# Patient Record
Sex: Female | Born: 1965
Health system: Southern US, Community
[De-identification: ages and names within clinical notes are randomized; demographics above are authoritative.]

## PROBLEM LIST (undated history)

## (undated) DIAGNOSIS — K219 Gastro-esophageal reflux disease without esophagitis: Secondary | ICD-10-CM

## (undated) DIAGNOSIS — I10 Essential (primary) hypertension: Secondary | ICD-10-CM

## (undated) DIAGNOSIS — R569 Unspecified convulsions: Secondary | ICD-10-CM

## (undated) HISTORY — DX: Gastro-esophageal reflux disease without esophagitis: K21.9

---

## 1997-11-30 ENCOUNTER — Emergency Department (HOSPITAL_COMMUNITY): Admission: EM | Admit: 1997-11-30 | Discharge: 1997-11-30 | Payer: Self-pay | Admitting: Internal Medicine

## 1998-01-15 ENCOUNTER — Emergency Department (HOSPITAL_COMMUNITY): Admission: EM | Admit: 1998-01-15 | Discharge: 1998-01-15 | Payer: Self-pay | Admitting: Emergency Medicine

## 1998-01-15 ENCOUNTER — Encounter: Payer: Self-pay | Admitting: Emergency Medicine

## 1998-01-24 ENCOUNTER — Emergency Department (HOSPITAL_COMMUNITY): Admission: EM | Admit: 1998-01-24 | Discharge: 1998-01-25 | Payer: Self-pay | Admitting: Emergency Medicine

## 1998-01-31 ENCOUNTER — Encounter: Admission: RE | Admit: 1998-01-31 | Discharge: 1998-01-31 | Payer: Self-pay | Admitting: Family Medicine

## 1998-02-17 ENCOUNTER — Encounter: Admission: RE | Admit: 1998-02-17 | Discharge: 1998-02-17 | Payer: Self-pay | Admitting: Family Medicine

## 2000-11-17 ENCOUNTER — Encounter: Admission: RE | Admit: 2000-11-17 | Discharge: 2000-11-17 | Payer: Self-pay | Admitting: Family Medicine

## 2000-11-29 ENCOUNTER — Encounter: Admission: RE | Admit: 2000-11-29 | Discharge: 2000-11-29 | Payer: Self-pay | Admitting: Sports Medicine

## 2000-11-29 ENCOUNTER — Encounter: Payer: Self-pay | Admitting: Sports Medicine

## 2000-12-23 ENCOUNTER — Encounter (INDEPENDENT_AMBULATORY_CARE_PROVIDER_SITE_OTHER): Payer: Self-pay | Admitting: *Deleted

## 2000-12-23 LAB — CONVERTED CEMR LAB

## 2000-12-26 ENCOUNTER — Encounter: Admission: RE | Admit: 2000-12-26 | Discharge: 2000-12-26 | Payer: Self-pay | Admitting: Sports Medicine

## 2001-01-02 ENCOUNTER — Encounter: Admission: RE | Admit: 2001-01-02 | Discharge: 2001-01-02 | Payer: Self-pay | Admitting: Family Medicine

## 2001-04-14 ENCOUNTER — Encounter: Admission: RE | Admit: 2001-04-14 | Discharge: 2001-04-14 | Payer: Self-pay | Admitting: Family Medicine

## 2001-07-05 ENCOUNTER — Emergency Department (HOSPITAL_COMMUNITY): Admission: EM | Admit: 2001-07-05 | Discharge: 2001-07-05 | Payer: Self-pay

## 2001-07-19 ENCOUNTER — Encounter: Admission: RE | Admit: 2001-07-19 | Discharge: 2001-07-19 | Payer: Self-pay | Admitting: Family Medicine

## 2001-07-27 ENCOUNTER — Encounter: Admission: RE | Admit: 2001-07-27 | Discharge: 2001-07-27 | Payer: Self-pay | Admitting: Family Medicine

## 2003-04-29 ENCOUNTER — Emergency Department (HOSPITAL_COMMUNITY): Admission: EM | Admit: 2003-04-29 | Discharge: 2003-04-30 | Payer: Self-pay | Admitting: Emergency Medicine

## 2005-06-08 ENCOUNTER — Emergency Department (HOSPITAL_COMMUNITY): Admission: EM | Admit: 2005-06-08 | Discharge: 2005-06-09 | Payer: Self-pay | Admitting: Emergency Medicine

## 2006-04-21 DIAGNOSIS — K219 Gastro-esophageal reflux disease without esophagitis: Secondary | ICD-10-CM

## 2006-04-21 HISTORY — DX: Gastro-esophageal reflux disease without esophagitis: K21.9

## 2006-04-22 ENCOUNTER — Encounter (INDEPENDENT_AMBULATORY_CARE_PROVIDER_SITE_OTHER): Payer: Self-pay | Admitting: *Deleted

## 2006-07-16 ENCOUNTER — Emergency Department (HOSPITAL_COMMUNITY): Admission: EM | Admit: 2006-07-16 | Discharge: 2006-07-17 | Payer: Self-pay | Admitting: Emergency Medicine

## 2006-08-04 ENCOUNTER — Telehealth: Payer: Self-pay | Admitting: *Deleted

## 2006-08-05 ENCOUNTER — Ambulatory Visit: Payer: Self-pay | Admitting: Family Medicine

## 2006-08-05 ENCOUNTER — Encounter: Payer: Self-pay | Admitting: *Deleted

## 2006-08-05 ENCOUNTER — Encounter: Payer: Self-pay | Admitting: Family Medicine

## 2006-08-05 DIAGNOSIS — N390 Urinary tract infection, site not specified: Secondary | ICD-10-CM

## 2006-08-05 DIAGNOSIS — R109 Unspecified abdominal pain: Secondary | ICD-10-CM

## 2006-08-05 LAB — CONVERTED CEMR LAB
Beta hcg, urine, semiquantitative: NEGATIVE
Bilirubin Urine: NEGATIVE
Glucose, Urine, Semiquant: NEGATIVE
Ketones, urine, test strip: NEGATIVE
Nitrite: NEGATIVE
Protein, U semiquant: 100
Specific Gravity, Urine: 1.015
Urobilinogen, UA: 1
pH: 6.5

## 2006-08-12 ENCOUNTER — Encounter: Payer: Self-pay | Admitting: Family Medicine

## 2006-08-12 ENCOUNTER — Ambulatory Visit: Payer: Self-pay | Admitting: Family Medicine

## 2006-08-12 DIAGNOSIS — I10 Essential (primary) hypertension: Secondary | ICD-10-CM

## 2006-08-12 DIAGNOSIS — E669 Obesity, unspecified: Secondary | ICD-10-CM

## 2006-08-12 LAB — CONVERTED CEMR LAB
ALT: 10 units/L (ref 0–35)
AST: 9 units/L (ref 0–37)
Albumin: 3.8 g/dL (ref 3.5–5.2)
Alkaline Phosphatase: 100 units/L (ref 39–117)
BUN: 5 mg/dL — ABNORMAL LOW (ref 6–23)
CO2: 21 meq/L (ref 19–32)
Calcium: 9.1 mg/dL (ref 8.4–10.5)
Chloride: 106 meq/L (ref 96–112)
Creatinine, Ser: 0.8 mg/dL (ref 0.40–1.20)
Direct LDL: 133 mg/dL — ABNORMAL HIGH
Glucose, Bld: 91 mg/dL (ref 70–99)
Potassium: 4.2 meq/L (ref 3.5–5.3)
Sodium: 138 meq/L (ref 135–145)
TSH: 0.826 microintl units/mL (ref 0.350–5.50)
Total Bilirubin: 0.2 mg/dL — ABNORMAL LOW (ref 0.3–1.2)
Total Protein: 7.2 g/dL (ref 6.0–8.3)

## 2006-09-09 ENCOUNTER — Ambulatory Visit: Payer: Self-pay | Admitting: Family Medicine

## 2006-10-31 ENCOUNTER — Telehealth (INDEPENDENT_AMBULATORY_CARE_PROVIDER_SITE_OTHER): Payer: Self-pay | Admitting: Family Medicine

## 2007-12-06 ENCOUNTER — Ambulatory Visit: Payer: Self-pay | Admitting: Family Medicine

## 2007-12-06 DIAGNOSIS — L708 Other acne: Secondary | ICD-10-CM

## 2008-01-09 ENCOUNTER — Encounter: Payer: Self-pay | Admitting: *Deleted

## 2008-01-30 ENCOUNTER — Ambulatory Visit: Payer: Self-pay | Admitting: Family Medicine

## 2008-01-30 ENCOUNTER — Encounter (INDEPENDENT_AMBULATORY_CARE_PROVIDER_SITE_OTHER): Payer: Self-pay | Admitting: Family Medicine

## 2008-01-30 LAB — CONVERTED CEMR LAB
ALT: 13 units/L (ref 0–35)
Albumin: 3.9 g/dL (ref 3.5–5.2)
CO2: 22 meq/L (ref 19–32)
Calcium: 8.9 mg/dL (ref 8.4–10.5)
Chloride: 104 meq/L (ref 96–112)
Creatinine, Ser: 0.76 mg/dL (ref 0.40–1.20)
Direct LDL: 153 mg/dL — ABNORMAL HIGH
Total Protein: 7 g/dL (ref 6.0–8.3)

## 2008-01-31 ENCOUNTER — Encounter (INDEPENDENT_AMBULATORY_CARE_PROVIDER_SITE_OTHER): Payer: Self-pay | Admitting: Family Medicine

## 2008-02-27 ENCOUNTER — Ambulatory Visit: Payer: Self-pay | Admitting: Family Medicine

## 2008-09-13 ENCOUNTER — Telehealth: Payer: Self-pay | Admitting: *Deleted

## 2008-11-09 ENCOUNTER — Encounter (INDEPENDENT_AMBULATORY_CARE_PROVIDER_SITE_OTHER): Payer: Self-pay | Admitting: *Deleted

## 2008-11-09 DIAGNOSIS — Z87891 Personal history of nicotine dependence: Secondary | ICD-10-CM | POA: Insufficient documentation

## 2012-08-28 ENCOUNTER — Encounter (HOSPITAL_COMMUNITY): Payer: Self-pay | Admitting: Emergency Medicine

## 2012-08-28 ENCOUNTER — Emergency Department (HOSPITAL_COMMUNITY)
Admission: EM | Admit: 2012-08-28 | Discharge: 2012-08-28 | Disposition: A | Discharge status: Home / Self Care | Payer: Self-pay | Attending: Emergency Medicine | Admitting: Emergency Medicine

## 2012-08-28 ENCOUNTER — Emergency Department (HOSPITAL_COMMUNITY): Payer: Self-pay

## 2012-08-28 DIAGNOSIS — Z79899 Other long term (current) drug therapy: Secondary | ICD-10-CM | POA: Insufficient documentation

## 2012-08-28 DIAGNOSIS — J189 Pneumonia, unspecified organism: Secondary | ICD-10-CM

## 2012-08-28 DIAGNOSIS — J159 Unspecified bacterial pneumonia: Secondary | ICD-10-CM | POA: Insufficient documentation

## 2012-08-28 DIAGNOSIS — R52 Pain, unspecified: Secondary | ICD-10-CM | POA: Insufficient documentation

## 2012-08-28 DIAGNOSIS — F17200 Nicotine dependence, unspecified, uncomplicated: Secondary | ICD-10-CM | POA: Insufficient documentation

## 2012-08-28 MED ORDER — HYDROCOD POLST-CHLORPHEN POLST 10-8 MG/5ML PO LQCR
5.0000 mL | Freq: Two times a day (BID) | ORAL | Status: DC
  Administered 2012-08-28: 5 mL via ORAL
  Filled 2012-08-28: qty 5

## 2012-08-28 MED ORDER — HYDROCOD POLST-CHLORPHEN POLST 10-8 MG/5ML PO LQCR: 5.0000 mL | Freq: Two times a day (BID) | ORAL | Status: DC

## 2012-08-28 MED ORDER — AZITHROMYCIN 250 MG PO TABS: 250.0000 mg | ORAL_TABLET | Freq: Every day | ORAL | Status: DC

## 2012-08-28 MED ORDER — CEFTRIAXONE SODIUM 1 G IJ SOLR
1.0000 g | Freq: Once | INTRAMUSCULAR | Status: AC
  Administered 2012-08-28: 1 g via INTRAMUSCULAR
  Filled 2012-08-28: qty 10

## 2012-08-28 MED ORDER — ALBUTEROL SULFATE HFA 108 (90 BASE) MCG/ACT IN AERS
2.0000 | INHALATION_SPRAY | RESPIRATORY_TRACT | Status: DC | PRN
  Administered 2012-08-28: 2 via RESPIRATORY_TRACT
  Filled 2012-08-28: qty 6.7

## 2012-08-28 MED ORDER — AZITHROMYCIN 250 MG PO TABS
500.0000 mg | ORAL_TABLET | Freq: Once | ORAL | Status: AC
  Administered 2012-08-28: 500 mg via ORAL
  Filled 2012-08-28: qty 2

## 2012-08-28 NOTE — ED Provider Notes (Signed)
History    CSN: [id number] Arrival date & time 08/28/12  0100  First MD Initiated Contact with Patient 08/28/12 0122     Chief Complaint  Patient presents with  . Cough  . Generalized Body Aches   (Consider location/radiation/quality/duration/timing/severity/associated sxs/prior Treatment) HPI Comments: Patient with 3 days of cough that is intermittent and persistent  Has been taking OTC Nyquil without relief   Patient is a 47 y.o. female presenting with cough. The history is provided by the patient.  Cough Cough characteristics:  Productive Sputum characteristics:  Nondescript Severity:  Moderate Onset quality:  Gradual Timing:  Intermittent Chronicity:  New Smoker: yes   Relieved by:  Nothing Worsened by:  Activity Ineffective treatments:  Decongestant Associated symptoms: no chest pain, no chills, no fever, no headaches, no shortness of breath, no sore throat and no wheezing    History reviewed. No pertinent past medical history. History reviewed. No pertinent past surgical history. History reviewed. No pertinent family history. History  Substance Use Topics  . Smoking status: Current Every Day Smoker  . Smokeless tobacco: Not on file  . Alcohol Use: Not on file   OB History   Grav Para Term Preterm Abortions TAB SAB Ect Mult Living                 Review of Systems  Constitutional: Negative for fever and chills.  HENT: Negative for sore throat.   Respiratory: Positive for cough. Negative for shortness of breath and wheezing.   Cardiovascular: Negative for chest pain.  Neurological: Negative for dizziness and headaches.  All other systems reviewed and are negative.    Allergies  Ace inhibitors  Home Medications   Current Outpatient Rx  Name  Route  Sig  Dispense  Refill  . lansoprazole (PREVACID) 30 MG capsule   Oral   Take 30 mg by mouth as needed (only use when eating spicy food).         . naproxen sodium (ANAPROX) 220 MG tablet   Oral   Take  220 mg by mouth 2 (two) times daily with a meal.         . Pseudoeph-Doxylamine-DM-APAP (NYQUIL PO)   Oral   Take 30 mLs by mouth every 6 (six) hours as needed (cough).         Marland Kitchen azithromycin (ZITHROMAX) 250 MG tablet   Oral   Take 1 tablet (250 mg total) by mouth daily.   4 tablet   0   . chlorpheniramine-HYDROcodone (TUSSIONEX) 10-8 MG/5ML LQCR   Oral   Take 5 mLs by mouth every 12 (twelve) hours.   140 mL   0    BP 165/113  Pulse 89  Temp(Src) 99 F (37.2 C) (Oral)  Resp 20  Ht 5\' 2"  (1.575 m)  Wt 152 lb 4 oz (69.06 kg)  BMI 27.84 kg/m2  SpO2 96%  LMP 08/12/2012 Physical Exam  Vitals reviewed. Constitutional: She is oriented to person, place, and time. She appears well-developed and well-nourished.  HENT:  Head: Normocephalic.  Mouth/Throat: Oropharynx is clear and moist.  Eyes: Pupils are equal, round, and reactive to light.  Neck: Normal range of motion.  Cardiovascular: Normal rate and regular rhythm.   Pulmonary/Chest: Effort normal and breath sounds normal. She has no wheezes.  coughing with speech   Abdominal: Soft.  Musculoskeletal: Normal range of motion.  Lymphadenopathy:    She has no cervical adenopathy.  Neurological: She is alert and oriented to person, place, and time.  Skin: Skin is warm.    ED Course  Procedures (including critical care time) Labs Reviewed - No data to display Dg Chest 2 View  08/28/2012   *RADIOLOGY REPORT*  Clinical Data: Cough and chest pain for 72 hours.  CHEST - 2 VIEW  Comparison: None.  Findings: There is diffuse bilateral airspace infiltration throughout both lungs consistent with pneumonia, hemorrhage, or other infiltrative process.  Edema is less likely.  Normal heart size and pulmonary vascularity.  No blunting of costophrenic angles.  No pneumothorax.  Follow up after resolution of acute symptoms is recommended to exclude underlying neoplasm.  IMPRESSION: Diffuse airspace disease throughout both lungs.   Original  Report Authenticated By: Burman Nieves, M.D.   1. CAP (community acquired pneumonia)     MDM  X-ray shows that the patient has patchy, infiltrates.  She's been treated with Rocephin IM and by mouth azithromycin in the emergency department to be discharged him with a prescription for additional azithromycin test in cough medication, and an inhaler Patient states, after she used the inhaler, and had one dose of Testim cough medication.  She feels much, better  Arman Filter, NP 08/28/12 0329  Arman Filter, NP 08/28/12 1954

## 2012-08-28 NOTE — ED Notes (Signed)
Pt states that she has had a productive cough with white sputum x 3 days. Noticed some blood in sputum. States body aches from coughing. Smoker.

## 2012-08-28 NOTE — ED Provider Notes (Signed)
Medical screening examination/treatment/procedure(s) were performed by non-physician practitioner and as supervising physician I was immediately available for consultation/collaboration.   Richardean Canal, MD 08/28/12 [phone]

## 2012-09-02 ENCOUNTER — Encounter (HOSPITAL_COMMUNITY): Payer: Self-pay | Admitting: Family Medicine

## 2012-09-02 ENCOUNTER — Emergency Department (HOSPITAL_COMMUNITY): Payer: Self-pay

## 2012-09-02 ENCOUNTER — Inpatient Hospital Stay (HOSPITAL_COMMUNITY)
Admission: EM | Admit: 2012-09-02 | Discharge: 2012-09-05 | DRG: 194 | Disposition: A | Discharge status: Home / Self Care | Payer: MEDICAID | Attending: Family Medicine | Admitting: Family Medicine

## 2012-09-02 DIAGNOSIS — F17200 Nicotine dependence, unspecified, uncomplicated: Secondary | ICD-10-CM

## 2012-09-02 DIAGNOSIS — J189 Pneumonia, unspecified organism: Principal | ICD-10-CM | POA: Diagnosis present

## 2012-09-02 DIAGNOSIS — R0602 Shortness of breath: Secondary | ICD-10-CM | POA: Diagnosis present

## 2012-09-02 DIAGNOSIS — R000 Tachycardia, unspecified: Secondary | ICD-10-CM | POA: Diagnosis present

## 2012-09-02 DIAGNOSIS — R109 Unspecified abdominal pain: Secondary | ICD-10-CM

## 2012-09-02 DIAGNOSIS — E871 Hypo-osmolality and hyponatremia: Secondary | ICD-10-CM

## 2012-09-02 DIAGNOSIS — R0682 Tachypnea, not elsewhere classified: Secondary | ICD-10-CM | POA: Diagnosis present

## 2012-09-02 DIAGNOSIS — L708 Other acne: Secondary | ICD-10-CM

## 2012-09-02 DIAGNOSIS — Z87891 Personal history of nicotine dependence: Secondary | ICD-10-CM | POA: Diagnosis present

## 2012-09-02 DIAGNOSIS — E669 Obesity, unspecified: Secondary | ICD-10-CM

## 2012-09-02 DIAGNOSIS — K219 Gastro-esophageal reflux disease without esophagitis: Secondary | ICD-10-CM | POA: Diagnosis present

## 2012-09-02 DIAGNOSIS — I10 Essential (primary) hypertension: Secondary | ICD-10-CM | POA: Diagnosis present

## 2012-09-02 DIAGNOSIS — N390 Urinary tract infection, site not specified: Secondary | ICD-10-CM

## 2012-09-02 HISTORY — DX: Gastro-esophageal reflux disease without esophagitis: K21.9

## 2012-09-02 LAB — POCT I-STAT, CHEM 8
BUN: 7 mg/dL (ref 6–23)
Potassium: 4.3 mEq/L (ref 3.5–5.1)
Sodium: 137 mEq/L (ref 135–145)
TCO2: 25 mmol/L (ref 0–100)

## 2012-09-02 LAB — CBC
HCT: 36 % (ref 36.0–46.0)
Hemoglobin: 12 g/dL (ref 12.0–15.0)
MCHC: 33.3 g/dL (ref 30.0–36.0)
RBC: 4.22 MIL/uL (ref 3.87–5.11)
WBC: 12.1 10*3/uL — ABNORMAL HIGH (ref 4.0–10.5)

## 2012-09-02 LAB — PRO B NATRIURETIC PEPTIDE: Pro B Natriuretic peptide (BNP): 2696 pg/mL — ABNORMAL HIGH (ref 0–125)

## 2012-09-02 MED ORDER — ALBUTEROL SULFATE (5 MG/ML) 0.5% IN NEBU
5.0000 mg | INHALATION_SOLUTION | Freq: Once | RESPIRATORY_TRACT | Status: AC
  Administered 2012-09-02: 5 mg via RESPIRATORY_TRACT
  Filled 2012-09-02: qty 1

## 2012-09-02 MED ORDER — OXYCODONE HCL 5 MG PO TABS
5.0000 mg | ORAL_TABLET | ORAL | Status: DC | PRN
  Administered 2012-09-04: 5 mg via ORAL
  Filled 2012-09-02: qty 1

## 2012-09-02 MED ORDER — LEVOFLOXACIN IN D5W 750 MG/150ML IV SOLN
750.0000 mg | INTRAVENOUS | Status: DC
  Administered 2012-09-03 – 2012-09-05 (×3): 750 mg via INTRAVENOUS
  Filled 2012-09-02 (×4): qty 150

## 2012-09-02 MED ORDER — VANCOMYCIN HCL IN DEXTROSE 750-5 MG/150ML-% IV SOLN
750.0000 mg | Freq: Two times a day (BID) | INTRAVENOUS | Status: DC
  Administered 2012-09-02 – 2012-09-04 (×5): 750 mg via INTRAVENOUS
  Filled 2012-09-02 (×6): qty 150

## 2012-09-02 MED ORDER — ACETAMINOPHEN 650 MG RE SUPP: 650.0000 mg | Freq: Four times a day (QID) | RECTAL | Status: DC | PRN

## 2012-09-02 MED ORDER — ACETAMINOPHEN 325 MG PO TABS: 650.0000 mg | ORAL_TABLET | Freq: Four times a day (QID) | ORAL | Status: DC | PRN

## 2012-09-02 MED ORDER — ONDANSETRON HCL 4 MG/2ML IJ SOLN: 4.0000 mg | Freq: Four times a day (QID) | INTRAMUSCULAR | Status: DC | PRN

## 2012-09-02 MED ORDER — ALBUTEROL SULFATE (5 MG/ML) 0.5% IN NEBU
2.5000 mg | INHALATION_SOLUTION | Freq: Four times a day (QID) | RESPIRATORY_TRACT | Status: DC
  Administered 2012-09-02 – 2012-09-03 (×4): 2.5 mg via RESPIRATORY_TRACT
  Filled 2012-09-02 (×4): qty 0.5

## 2012-09-02 MED ORDER — ONDANSETRON HCL 4 MG PO TABS: 4.0000 mg | ORAL_TABLET | Freq: Four times a day (QID) | ORAL | Status: DC | PRN

## 2012-09-02 MED ORDER — DM-GUAIFENESIN ER 30-600 MG PO TB12
1.0000 | ORAL_TABLET | Freq: Two times a day (BID) | ORAL | Status: DC
  Administered 2012-09-02 – 2012-09-05 (×7): 1 via ORAL
  Filled 2012-09-02 (×8): qty 1

## 2012-09-02 MED ORDER — HYDROMORPHONE HCL PF 1 MG/ML IJ SOLN
0.5000 mg | INTRAMUSCULAR | Status: DC | PRN
  Administered 2012-09-03 – 2012-09-04 (×3): 1 mg via INTRAVENOUS
  Filled 2012-09-02 (×3): qty 1

## 2012-09-02 MED ORDER — LORAZEPAM 0.5 MG PO TABS
0.2500 mg | ORAL_TABLET | Freq: Two times a day (BID) | ORAL | Status: DC | PRN
  Administered 2012-09-02 – 2012-09-03 (×2): 0.5 mg via ORAL
  Administered 2012-09-03: 0.25 mg via ORAL
  Filled 2012-09-02: qty 1
  Filled 2012-09-02: qty 2
  Filled 2012-09-02: qty 1

## 2012-09-02 MED ORDER — PHENOL 1.4 % MT LIQD
1.0000 | OROMUCOSAL | Status: DC | PRN
  Filled 2012-09-02: qty 177

## 2012-09-02 MED ORDER — ALBUTEROL SULFATE (5 MG/ML) 0.5% IN NEBU
2.5000 mg | INHALATION_SOLUTION | RESPIRATORY_TRACT | Status: DC | PRN
  Administered 2012-09-02 (×3): 2.5 mg via RESPIRATORY_TRACT
  Filled 2012-09-02 (×3): qty 0.5

## 2012-09-02 MED ORDER — BENZONATATE 100 MG PO CAPS
100.0000 mg | ORAL_CAPSULE | Freq: Three times a day (TID) | ORAL | Status: DC | PRN
  Administered 2012-09-02 – 2012-09-05 (×8): 100 mg via ORAL
  Filled 2012-09-02 (×5): qty 1

## 2012-09-02 MED ORDER — PANTOPRAZOLE SODIUM 40 MG PO TBEC
40.0000 mg | DELAYED_RELEASE_TABLET | Freq: Every day | ORAL | Status: DC
  Administered 2012-09-02 – 2012-09-05 (×4): 40 mg via ORAL
  Filled 2012-09-02 (×4): qty 1

## 2012-09-02 MED ORDER — ALUM & MAG HYDROXIDE-SIMETH 200-200-20 MG/5ML PO SUSP: 30.0000 mL | Freq: Four times a day (QID) | ORAL | Status: DC | PRN

## 2012-09-02 MED ORDER — DEXTROSE 5 % IV SOLN
2.0000 g | Freq: Two times a day (BID) | INTRAVENOUS | Status: DC
  Administered 2012-09-02 – 2012-09-04 (×6): 2 g via INTRAVENOUS
  Filled 2012-09-02 (×8): qty 2

## 2012-09-02 MED ORDER — HYDROCOD POLST-CHLORPHEN POLST 10-8 MG/5ML PO LQCR: 5.0000 mL | Freq: Two times a day (BID) | ORAL | Status: DC

## 2012-09-02 MED ORDER — LEVOFLOXACIN IN D5W 500 MG/100ML IV SOLN
500.0000 mg | Freq: Once | INTRAVENOUS | Status: AC
  Administered 2012-09-02: 500 mg via INTRAVENOUS
  Filled 2012-09-02: qty 100

## 2012-09-02 MED ORDER — VANCOMYCIN HCL IN DEXTROSE 1-5 GM/200ML-% IV SOLN
1000.0000 mg | Freq: Once | INTRAVENOUS | Status: AC
  Administered 2012-09-02: 1000 mg via INTRAVENOUS
  Filled 2012-09-02 (×2): qty 200

## 2012-09-02 MED ORDER — ZOLPIDEM TARTRATE 5 MG PO TABS
5.0000 mg | ORAL_TABLET | Freq: Every evening | ORAL | Status: DC | PRN
  Administered 2012-09-03 – 2012-09-04 (×3): 5 mg via ORAL
  Filled 2012-09-02 (×3): qty 1

## 2012-09-02 MED ORDER — ENOXAPARIN SODIUM 40 MG/0.4ML ~~LOC~~ SOLN
40.0000 mg | SUBCUTANEOUS | Status: DC
  Administered 2012-09-02 – 2012-09-05 (×4): 40 mg via SUBCUTANEOUS
  Filled 2012-09-02 (×4): qty 0.4

## 2012-09-02 MED ORDER — DEXTROSE 5 % IV SOLN
1.0000 g | Freq: Three times a day (TID) | INTRAVENOUS | Status: DC
  Filled 2012-09-02 (×2): qty 1

## 2012-09-02 MED ORDER — SODIUM CHLORIDE 0.9 % IV SOLN
INTRAVENOUS | Status: DC
  Administered 2012-09-02 (×2): via INTRAVENOUS

## 2012-09-02 NOTE — H&P (Signed)
[facility] History and Physical  Christina Byrd:[id number] DOB: 10-08-1965 DOA: 09/02/2012  Referring physician:  EDP PCP: No primary provider on file.  Specialists:   Chief Complaint: SOB  HPI: Christina Byrd is a 47 y.o. female who presents to the ED with complaints of worsening SOB and Cough despite outpatient antibiotic treatment with azithromycin.  She had been seen 5 days ago in the Ed and was administered IV Rocephin X 1 dose and then placed on the Z paK.   She returned to the ED with worsening.  She denies having fevers or chills or nausea or vomiting.  She does report having a cough.   She was found to have worsening bilateral infiltrates on chest x-ray this visit.  She was referred for medical admission, and was placed on IV Vancomycin, Cefepime and Levaquin.  She is a smoker but states that she last smoked 5 days ago due to her symptoms.     Review of Systems: The patient denies anorexia, fever, chills, headaches, weight loss, vision loss, diplopia, dizziness, decreased hearing, rhinitis, hoarseness, chest pain, syncope, dyspnea on exertion, peripheral edema, balance deficits, hemoptysis, abdominal pain, nausea, vomiting, diarrhea, constipation, hematemesis, melena, hematochezia, severe indigestion/heartburn, dysuria, hematuria, incontinence, muscle weakness, suspicious skin lesions, transient blindness, difficulty walking, depression, unusual weight change, abnormal bleeding, enlarged lymph nodes, angioedema, and breast masses.    Past Medical History  Diagnosis Date  . GERD (gastroesophageal reflux disease)     History reviewed. No pertinent past surgical history.     Prior to Admission medications   Medication Sig Start Date End Date Taking? Authorizing Provider  azithromycin (ZITHROMAX) 250 MG tablet Take 1 tablet (250 mg total) by mouth daily. 08/28/12  Yes Arman Filter, NP  chlorpheniramine-HYDROcodone (TUSSIONEX) 10-8 MG/5ML LQCR Take 5 mLs by mouth every  12 (twelve) hours. 08/28/12  Yes Arman Filter, NP  lansoprazole (PREVACID) 30 MG capsule Take 30 mg by mouth as needed (only use when eating spicy food).   Yes Historical Provider, MD  naproxen sodium (ANAPROX) 220 MG tablet Take 220 mg by mouth 2 (two) times daily with a meal.   Yes Historical Provider, MD  Pseudoeph-Doxylamine-DM-APAP (NYQUIL PO) Take 30 mLs by mouth every 6 (six) hours as needed (cough).   Yes Historical Provider, MD    Allergies  Allergen Reactions  . Ace Inhibitors Cough    REACTION: cough    Social History:  reports that she has been smoking.  She does not have any smokeless tobacco history on file. Her alcohol and drug histories are not on file.     Family History  Problem Relation Age of Onset  . CAD Mother        Physical Exam:  GEN:  Pleasant well nourished and well developed  47 y.o. African American female  examined  and in no acute distress; cooperative with exam Filed Vitals:   09/02/12 0235 09/02/12 0246 09/02/12 0327 09/02/12 0529  BP: 140/90     Pulse: 101  114   Temp: 98.5 F (36.9 C)     TempSrc: Oral     Resp: 23     Height: 5\' 2"  (1.575 m)     Weight: 68.04 kg (150 lb)     SpO2: 96% 96% 94% 95%   Blood pressure 140/90, pulse 114, temperature 98.5 F (36.9 C), temperature source Oral, resp. rate 23, height 5\' 2"  (1.575 m), weight 68.04 kg (150 lb), last menstrual period 08/12/2012, SpO2 95.00%. PSYCH: She  is alert and oriented x4; does not appear anxious does not appear depressed; affect is normal HEENT: Normocephalic and Atraumatic, Mucous membranes pink; PERRLA; EOM intact; Fundi:  Benign;  No scleral icterus, Nares: Patent, Oropharynx: Clear, Fair Dentition, Neck:  FROM, no cervical lymphadenopathy nor thyromegaly or carotid bruit; no JVD; Breasts:: Not examined CHEST WALL: No tenderness CHEST: Normal respiration, clear to auscultation bilaterally HEART: Regular rate and rhythm; no murmurs rubs or gallops BACK: No kyphosis or  scoliosis; no CVA tenderness ABDOMEN: Positive Bowel Sounds, soft non-tender; no masses, no organomegaly. Rectal Exam: Not done EXTREMITIES: No cyanosis, clubbing or edema; no ulcerations. Genitalia: not examined PULSES: 2+ and symmetric SKIN: Normal hydration no rash or ulceration CNS: Cranial nerves 2-12 grossly intact no focal neurologic deficit    Labs on Admission:  Basic Metabolic Panel:  Recent Labs Lab 09/02/12 0451  NA 137  K 4.3  CL 102  GLUCOSE 98  BUN 7  CREATININE 0.80   Liver Function Tests: No results found for this basename: AST, ALT, ALKPHOS, BILITOT, PROT, ALBUMIN,  in the last 168 hours No results found for this basename: LIPASE, AMYLASE,  in the last 168 hours No results found for this basename: AMMONIA,  in the last 168 hours CBC:  Recent Labs Lab 09/02/12 0440 09/02/12 0451  WBC 12.1*  --   HGB 12.0 12.9  HCT 36.0 38.0  MCV 85.3  --   PLT 263  --    Cardiac Enzymes: No results found for this basename: CKTOTAL, CKMB, CKMBINDEX, TROPONINI,  in the last 168 hours  BNP (last 3 results)  Recent Labs  09/02/12 0440  PROBNP 2696.0*   CBG: No results found for this basename: GLUCAP,  in the last 168 hours  Radiological Exams on Admission: Dg Chest 2 View  09/02/2012   *RADIOLOGY REPORT*  Clinical Data: Chest pain, cough, and shortness of breath.  CHEST - 2 VIEW  Comparison: 08/28/2012  Findings: Diffuse bilateral pulmonary airspace disease.  Airspace disease obscures visualization of the pulmonary vessels.  Heart size is normal.  No blunting of costophrenic angles.  No pneumothorax.  Changes appear similar previous study.  Changes could be due to pneumonia, hemorrhage, or other infiltrative process.  Edema is possible.  IMPRESSION: Diffuse bilateral airspace infiltrates throughout both lungs, similar to previous study.  No significant improvement.   Original Report Authenticated By: Burman Nieves, M.D.      Assessment/Plan: Present on  Admission:  . HCAP (healthcare-associated pneumonia) . SOB (shortness of breath) . TOBACCO USER . GASTROESOPHAGEAL REFLUX, NO ESOPHAGITIS    1.   HCAP-   Placed on IV Vancomycin, Cefepime,and Levaquin  with Albuterol Nebs and O2 PRN.    2.   SOB- due to #1  3.   GERD- Protonix daily  4.   Tobacco Use Disorder-    Has not smoked in 5 days, will not start Nicotine patch unless requested by patient.        Encouraged to continue smoking cessation.       Code Status:   FULL CODE    Family Communication:    Family at Bedside Disposition Plan:       Return to Home  Time spent:  11 Minutes  Ron Parker [facility] Pager [phone]  If 7PM-7AM, please contact night-coverage [email or web address] Password [facility] 09/02/2012, 6:24 AM

## 2012-09-02 NOTE — Progress Notes (Signed)
ANTIBIOTIC CONSULT NOTE - INITIAL  Pharmacy Consult for vancomycin/cefepime Indication: pneumonia  Allergies  Allergen Reactions  . Ace Inhibitors Cough    REACTION: cough    Patient Measurements: Height: 5\' 2"  (157.5 cm) Weight: 150 lb (68.04 kg) IBW/kg (Calculated) : 50.1 Adjusted Body Weight:   Vital Signs: Temp: 98.5 F (36.9 C) (07/12 0235) Temp src: Oral (07/12 0235) BP: 140/90 mmHg (07/12 0235) Pulse Rate: 114 (07/12 0327) Intake/Output from previous day:   Intake/Output from this shift:    Labs:  Recent Labs  09/02/12 0440 09/02/12 0451  WBC 12.1*  --   HGB 12.0 12.9  PLT 263  --   CREATININE  --  0.80   Estimated Creatinine Clearance: 78.6 ml/min (by C-G formula based on Cr of 0.8). No results found for this basename: VANCOTROUGH, VANCOPEAK, VANCORANDOM, GENTTROUGH, GENTPEAK, GENTRANDOM, TOBRATROUGH, TOBRAPEAK, TOBRARND, AMIKACINPEAK, AMIKACINTROU, AMIKACIN,  in the last 72 hours   Microbiology: No results found for this or any previous visit (from the past 720 hour(s)).  Medical History: History reviewed. No pertinent past medical history.  Medications:  Anti-infectives   Start     Dose/Rate Route Frequency Ordered Stop   09/02/12 2200  levofloxacin (LEVAQUIN) IVPB 750 mg     750 mg 100 mL/hr over 90 Minutes Intravenous Every 24 hours 09/02/12 0548     09/02/12 1800  vancomycin (VANCOCIN) IVPB 750 mg/150 ml premix     750 mg 150 mL/hr over 60 Minutes Intravenous Every 12 hours 09/02/12 0608     09/02/12 0615  ceFEPIme (MAXIPIME) 2 g in dextrose 5 % 50 mL IVPB     2 g 100 mL/hr over 30 Minutes Intravenous Every 12 hours 09/02/12 0548     09/02/12 0600  ceFEPIme (MAXIPIME) 1 g in dextrose 5 % 50 mL IVPB  Status:  Discontinued     1 g 100 mL/hr over 30 Minutes Intravenous 3 times per day 09/02/12 0527 09/02/12 0602   09/02/12 0515  vancomycin (VANCOCIN) IVPB 1000 mg/200 mL premix     1,000 mg 200 mL/hr over 60 Minutes Intravenous  Once 09/02/12  0513     09/02/12 0330  levofloxacin (LEVAQUIN) IVPB 500 mg     500 mg 100 mL/hr over 60 Minutes Intravenous  Once 09/02/12 0324       Assessment: Patient with PNA.    Goal of Therapy:  Vancomycin trough level 15-20 mcg/ml Cefepime dosed based on patient weight and renal function   Plan:  Measure antibiotic drug levels at steady state Follow up culture results Vancomycin 750mg  iv q12hr, cefepime 2gm iv q12hr  Aleene Davidson Crowford 09/02/2012,6:08 AM

## 2012-09-02 NOTE — ED Notes (Signed)
Pt ambulated with writer to the bathroom and O2 sats was at 94% and HR at 114

## 2012-09-02 NOTE — ED Notes (Signed)
Patient states she was seen here this past Sunday for cough and URI. States she was given an inhaler, zpak and hydrocodone. Continues to have a dry hacking cough.

## 2012-09-02 NOTE — Progress Notes (Signed)
Patient seen and evaluated earlier this AM by my associate. Please refer to H and P for assessment and plans.  Will reassess next am.  Penny Pia

## 2012-09-03 DIAGNOSIS — R0602 Shortness of breath: Secondary | ICD-10-CM

## 2012-09-03 DIAGNOSIS — E871 Hypo-osmolality and hyponatremia: Secondary | ICD-10-CM

## 2012-09-03 DIAGNOSIS — J189 Pneumonia, unspecified organism: Secondary | ICD-10-CM

## 2012-09-03 DIAGNOSIS — K219 Gastro-esophageal reflux disease without esophagitis: Secondary | ICD-10-CM

## 2012-09-03 LAB — BASIC METABOLIC PANEL
BUN: 4 mg/dL — ABNORMAL LOW (ref 6–23)
Chloride: 101 mEq/L (ref 96–112)
GFR calc Af Amer: >90 mL/min (ref >90)
Potassium: 4.1 mEq/L (ref 3.5–5.1)

## 2012-09-03 LAB — CBC
HCT: 32.7 % — ABNORMAL LOW (ref 36.0–46.0)
Hemoglobin: 10.8 g/dL — ABNORMAL LOW (ref 12.0–15.0)
WBC: 13.2 10*3/uL — ABNORMAL HIGH (ref 4.0–10.5)

## 2012-09-03 LAB — STREP PNEUMONIAE URINARY ANTIGEN: Strep Pneumo Urinary Antigen: NEGATIVE

## 2012-09-03 MED ORDER — LEVALBUTEROL HCL 1.25 MG/0.5ML IN NEBU
1.2500 mg | INHALATION_SOLUTION | Freq: Four times a day (QID) | RESPIRATORY_TRACT | Status: DC | PRN
  Administered 2012-09-03 – 2012-09-05 (×5): 1.25 mg via RESPIRATORY_TRACT
  Filled 2012-09-03 (×5): qty 0.5

## 2012-09-03 NOTE — Progress Notes (Signed)
[facility] PROGRESS NOTE  CURRY DULSKI AVW:[id number] DOB: 1965/10/19 DOA: 09/02/2012 PCP: No primary provider on file.  Assessment/Plan: 1. HCAP - Will continue current broad spectrum antibiotics - reassess WBC levels next am. - Patient afebrile but still tachycardic and tachypneic - change albuterol to xopenex prn wheeze or cough q 6 hours - place order for urine legionella/strep antigen - With continued improvement will simplify antibiotic regimen likely to Levaquin next am. - continue supportive therapy  2. GERD - stable on protonix  3. Hyponatremia - most likely due to poor oral solute intake - will reassess next am and encourage po intake.  Code Status: full Family Communication:  No family at bedside.  Disposition Plan: Pending continued improvement in condition.   Consultants:  None  Procedures:  none  Antibiotics:  Cefepime  Levaquin  Vancomycin  HPI/Subjective: No new complaints overnight. Feels better today.  Objective: Filed Vitals:   09/03/12 0211 09/03/12 0511 09/03/12 0740 09/03/12 1035  BP:  137/87  155/102  Pulse: 124 112  112  Temp:    98 F (36.7 C)  TempSrc:  Oral  Oral  Resp: 26 22    Height:      Weight:      SpO2: 96% 95% 96% 95%    Intake/Output Summary (Last 24 hours) at 09/03/12 1226 Last data filed at 09/03/12 0111  Gross per 24 hour  Intake      0 ml  Output      2 ml  Net     -2 ml   Filed Weights   09/02/12 0235 09/02/12 0535  Weight: 68.04 kg (150 lb) 69.4 kg (153 lb)    Exam:   General:  Pt in NAD, Alert and Awake  Cardiovascular: RRR, no MRG  Respiratory: rhales BL, no wheezes  Abdomen: soft, NT, ND  Musculoskeletal: no cyanosis or clubbing   Data Reviewed: Basic Metabolic Panel:  Recent Labs Lab 09/02/12 0451 09/03/12 0545  NA 137 133*  K 4.3 4.1  CL 102 101  CO2  --  22  GLUCOSE 98 112*  BUN 7 4*  CREATININE 0.80 0.66  CALCIUM  --  9.1   Liver Function Tests: No results  found for this basename: AST, ALT, ALKPHOS, BILITOT, PROT, ALBUMIN,  in the last 168 hours No results found for this basename: LIPASE, AMYLASE,  in the last 168 hours No results found for this basename: AMMONIA,  in the last 168 hours CBC:  Recent Labs Lab 09/02/12 0440 09/02/12 0451 09/03/12 0545  WBC 12.1*  --  13.2*  HGB 12.0 12.9 10.8*  HCT 36.0 38.0 32.7*  MCV 85.3  --  84.1  PLT 263  --  280   Cardiac Enzymes: No results found for this basename: CKTOTAL, CKMB, CKMBINDEX, TROPONINI,  in the last 168 hours BNP (last 3 results)  Recent Labs  09/02/12 0440  PROBNP 2696.0*   CBG: No results found for this basename: GLUCAP,  in the last 168 hours  No results found for this or any previous visit (from the past 240 hour(s)).   Studies: Dg Chest 2 View  09/02/2012   *RADIOLOGY REPORT*  Clinical Data: Chest pain, cough, and shortness of breath.  CHEST - 2 VIEW  Comparison: 08/28/2012  Findings: Diffuse bilateral pulmonary airspace disease.  Airspace disease obscures visualization of the pulmonary vessels.  Heart size is normal.  No blunting of costophrenic angles.  No pneumothorax.  Changes appear similar previous study.  Changes could  be due to pneumonia, hemorrhage, or other infiltrative process.  Edema is possible.  IMPRESSION: Diffuse bilateral airspace infiltrates throughout both lungs, similar to previous study.  No significant improvement.   Original Report Authenticated By: Burman Nieves, M.D.    Scheduled Meds: . albuterol  2.5 mg Nebulization Q6H  . ceFEPime (MAXIPIME) IV  2 g Intravenous Q12H  . dextromethorphan-guaiFENesin  1 tablet Oral BID  . enoxaparin (LOVENOX) injection  40 mg Subcutaneous Q24H  . levofloxacin (LEVAQUIN) IV  750 mg Intravenous Q24H  . pantoprazole  40 mg Oral Daily  . vancomycin  750 mg Intravenous Q12H   Continuous Infusions:   Principal Problem:   HCAP (healthcare-associated pneumonia) Active Problems:   TOBACCO USER    GASTROESOPHAGEAL REFLUX, NO ESOPHAGITIS   SOB (shortness of breath)   Hyponatremia    Time spent: > 35 minutes    Penny Pia  [facility] Pager [phone]. If 7PM-7AM, please contact night-coverage at [email or web address], password [facility] 09/03/2012, 12:26 PM  LOS: 1 day

## 2012-09-03 NOTE — ED Provider Notes (Signed)
History    CSN: [id number] Arrival date & time 09/02/12  0225  First MD Initiated Contact with Patient 09/02/12 0249     Chief Complaint  Patient presents with  . Cough   (Consider location/radiation/quality/duration/timing/severity/associated sxs/prior Treatment) HPI History is provided by the patient. Sick for the last week with cough shortness of breath, subjective fevers and chills with body aches. Known sick contacts. No recent travel. She is a smoker. She was evaluated here 5 days ago and given a Z-Pak and cough medication. She has finished his medications but is not getting better. Symptoms moderate to severe in cough is keeping her up at night.  No chest pain. No emesis. No diarrhea. She's also been using albuterol inhaler that provided some intermittent relief  Past Medical History  Diagnosis Date  . GERD (gastroesophageal reflux disease)    History reviewed. No pertinent past surgical history. Family History  Problem Relation Age of Onset  . CAD Mother    History  Substance Use Topics  . Smoking status: Current Every Day Smoker  . Smokeless tobacco: Not on file  . Alcohol Use: Not on file   OB History   Grav Para Term Preterm Abortions TAB SAB Ect Mult Living                 Review of Systems  Constitutional: Positive for fever and chills.  HENT: Negative for neck pain and neck stiffness.   Eyes: Negative for visual disturbance.  Respiratory: Positive for cough and shortness of breath.   Cardiovascular: Negative for chest pain.  Gastrointestinal: Negative for abdominal pain.  Genitourinary: Negative for dysuria.  Musculoskeletal: Negative for back pain.  Skin: Negative for rash.  Neurological: Negative for headaches.  All other systems reviewed and are negative.    Allergies  Ace inhibitors  Home Medications  No current outpatient prescriptions on file. BP 147/95  Pulse 110  Temp(Src) 98.3 F (36.8 C) (Oral)  Resp 20  Ht 5\' 2"  (1.575 m)  Wt 153  lb (69.4 kg)  BMI 27.98 kg/m2  SpO2 98%  LMP 08/12/2012 Physical Exam  Constitutional: She is oriented to person, place, and time. She appears well-developed and well-nourished.  HENT:  Head: Normocephalic and atraumatic.  Mouth/Throat: Oropharynx is clear and moist. No oropharyngeal exudate.  Eyes: EOM are normal. Pupils are equal, round, and reactive to light.  Neck: Neck supple.  Cardiovascular: Regular rhythm and intact distal pulses.   Borderline tachycardia heart rate 100-110  Pulmonary/Chest: Effort normal. No stridor. No respiratory distress.  Mildly coarse bilateral breath sounds  Abdominal: Soft. She exhibits no distension. There is no tenderness.  Musculoskeletal: Normal range of motion. She exhibits no edema.  Neurological: She is alert and oriented to person, place, and time. She exhibits normal muscle tone.  Skin: Skin is warm and dry.    ED Course  Procedures (including critical care time) Labs Reviewed  CBC - Abnormal; Notable for the following:    WBC 12.1 (*)    All other components within normal limits  PRO B NATRIURETIC PEPTIDE - Abnormal; Notable for the following:    Pro B Natriuretic peptide (BNP) 2696.0 (*)    All other components within normal limits  BASIC METABOLIC PANEL - Abnormal; Notable for the following:    Sodium 133 (*)    Glucose, Bld 112 (*)    BUN 4 (*)    All other components within normal limits  CBC - Abnormal; Notable for the following:  WBC 13.2 (*)    Hemoglobin 10.8 (*)    HCT 32.7 (*)    All other components within normal limits  STREP PNEUMONIAE URINARY ANTIGEN  LEGIONELLA ANTIGEN, URINE  CBC  BASIC METABOLIC PANEL  POCT I-STAT, CHEM 8   Dg Chest 2 View  09/02/2012   *RADIOLOGY REPORT*  Clinical Data: Chest pain, cough, and shortness of breath.  CHEST - 2 VIEW  Comparison: 08/28/2012  Findings: Diffuse bilateral pulmonary airspace disease.  Airspace disease obscures visualization of the pulmonary vessels.  Heart size is  normal.  No blunting of costophrenic angles.  No pneumothorax.  Changes appear similar previous study.  Changes could be due to pneumonia, hemorrhage, or other infiltrative process.  Edema is possible.  IMPRESSION: Diffuse bilateral airspace infiltrates throughout both lungs, similar to previous study.  No significant improvement.   Original Report Authenticated By: Burman Nieves, M.D.   1. HCAP (healthcare-associated pneumonia)   2. SOB (shortness of breath)   3. Esophageal reflux   4. Tobacco use disorder   5. Essential hypertension, benign   6. Hyponatremia   7. Urinary tract infection, site not specified    Albuterol breathing treatment, Levaquin and vancomycin provided Case discussed as above with Dr. Lovell Sheehan - she will admit. MDM  Persistent cough and URI symptoms with chest x-ray as above, bilateral infiltrates  Labs. IV antibiotics. Albuterol  Medical admission  Sunnie Nielsen, MD 09/03/12 2312

## 2012-09-04 DIAGNOSIS — K219 Gastro-esophageal reflux disease without esophagitis: Secondary | ICD-10-CM

## 2012-09-04 LAB — CBC
Platelets: 277 10*3/uL (ref 150–400)
RDW: 15.5 % (ref 11.5–15.5)
WBC: 11.8 10*3/uL — ABNORMAL HIGH (ref 4.0–10.5)

## 2012-09-04 LAB — BASIC METABOLIC PANEL
Chloride: 100 mEq/L (ref 96–112)
Creatinine, Ser: 0.73 mg/dL (ref 0.50–1.10)
GFR calc Af Amer: >90 mL/min (ref >90)
Potassium: 4.5 mEq/L (ref 3.5–5.1)

## 2012-09-04 MED ORDER — METOPROLOL TARTRATE 12.5 MG HALF TABLET
12.5000 mg | ORAL_TABLET | Freq: Two times a day (BID) | ORAL | Status: DC
  Administered 2012-09-04 – 2012-09-05 (×3): 12.5 mg via ORAL
  Filled 2012-09-04 (×4): qty 1

## 2012-09-04 MED ORDER — HYDROCOD POLST-CHLORPHEN POLST 10-8 MG/5ML PO LQCR
5.0000 mL | Freq: Once | ORAL | Status: AC
  Administered 2012-09-04: 5 mL via ORAL
  Filled 2012-09-04: qty 5

## 2012-09-04 MED ORDER — DIPHENHYDRAMINE HCL 50 MG/ML IJ SOLN
25.0000 mg | Freq: Once | INTRAMUSCULAR | Status: AC
  Administered 2012-09-04: 25 mg via INTRAVENOUS
  Filled 2012-09-04: qty 1
  Filled 2012-09-04: qty 0.5

## 2012-09-04 MED ORDER — DIPHENHYDRAMINE HCL 25 MG PO CAPS
25.0000 mg | ORAL_CAPSULE | Freq: Four times a day (QID) | ORAL | Status: DC | PRN
  Administered 2012-09-04: 25 mg via ORAL
  Filled 2012-09-04: qty 1

## 2012-09-04 NOTE — Progress Notes (Addendum)
[facility] PROGRESS NOTE  Christina Byrd WUJ:[id number] DOB: 1965-10-04 DOA: 09/02/2012 PCP: No primary provider on file.  Assessment/Plan: 1. HCAP - Will continue current broad spectrum antibiotics - reassess WBC levels next am. - Patient afebrile but still tachycardic and tachypneic - change albuterol to xopenex prn wheeze or cough q 6 hours -  urine legionella antigen pending, strep urine antigen negative - With continued improvement will simplify antibiotic regimen likely to Levaquin next am 7/15. - continue supportive therapy  2. GERD - stable on protonix  3. Hyponatremia - most likely due to poor oral solute intake - With continued improvement in po intake suspect that sodium level will improve.  Addendum 4. HTN:  Will add low dose metoprolol at this point. Reassess next am.  Code Status: full Family Communication:  No family at bedside.  Disposition Plan: Pending continued improvement in condition.   Consultants:  None  Procedures:  none  Antibiotics:  Cefepime  Levaquin  Vancomycin  HPI/Subjective: No new complaints overnight. States that her throat discomfort is improved.  Objective: Filed Vitals:   09/04/12 0955 09/04/12 1005 09/04/12 1010 09/04/12 1011  BP: 138/91 129/86 137/87   Pulse: 111   105  Temp: 98 F (36.7 C)     TempSrc: Oral     Resp: 18     Height:      Weight:      SpO2: 92% 94% 88% 97%    Intake/Output Summary (Last 24 hours) at 09/04/12 1122 Last data filed at 09/03/12 1200  Gross per 24 hour  Intake      0 ml  Output    400 ml  Net   -400 ml   Filed Weights   09/02/12 0235 09/02/12 0535  Weight: 68.04 kg (150 lb) 69.4 kg (153 lb)    Exam:   General:  Pt in NAD, Alert and Awake  Cardiovascular: RRR, no MRG  Respiratory: rhales BL, no wheezes  Abdomen: soft, NT, ND  Musculoskeletal: no cyanosis or clubbing   Data Reviewed: Basic Metabolic Panel:  Recent Labs Lab 09/02/12 0451  09/03/12 0545 09/04/12 0515  NA 137 133* 133*  K 4.3 4.1 4.5  CL 102 101 100  CO2  --  22 23  GLUCOSE 98 112* 119*  BUN 7 4* 4*  CREATININE 0.80 0.66 0.73  CALCIUM  --  9.1 8.8   Liver Function Tests: No results found for this basename: AST, ALT, ALKPHOS, BILITOT, PROT, ALBUMIN,  in the last 168 hours No results found for this basename: LIPASE, AMYLASE,  in the last 168 hours No results found for this basename: AMMONIA,  in the last 168 hours CBC:  Recent Labs Lab 09/02/12 0440 09/02/12 0451 09/03/12 0545 09/04/12 0515  WBC 12.1*  --  13.2* 11.8*  HGB 12.0 12.9 10.8* 10.3*  HCT 36.0 38.0 32.7* 31.3*  MCV 85.3  --  84.1 84.4  PLT 263  --  280 277   Cardiac Enzymes: No results found for this basename: CKTOTAL, CKMB, CKMBINDEX, TROPONINI,  in the last 168 hours BNP (last 3 results)  Recent Labs  09/02/12 0440  PROBNP 2696.0*   CBG: No results found for this basename: GLUCAP,  in the last 168 hours  No results found for this or any previous visit (from the past 240 hour(s)).   Studies: No results found.  Scheduled Meds: . ceFEPime (MAXIPIME) IV  2 g Intravenous Q12H  . dextromethorphan-guaiFENesin  1 tablet Oral BID  . enoxaparin (  LOVENOX) injection  40 mg Subcutaneous Q24H  . levofloxacin (LEVAQUIN) IV  750 mg Intravenous Q24H  . pantoprazole  40 mg Oral Daily  . vancomycin  750 mg Intravenous Q12H   Continuous Infusions:   Principal Problem:   HCAP (healthcare-associated pneumonia) Active Problems:   TOBACCO USER   GASTROESOPHAGEAL REFLUX, NO ESOPHAGITIS   SOB (shortness of breath)   Hyponatremia    Time spent: > 35 minutes    Penny Pia  [facility] Pager [phone]. If 7PM-7AM, please contact night-coverage at [email or web address], password [facility] 09/04/2012, 11:22 AM  LOS: 2 days

## 2012-09-05 LAB — LEGIONELLA ANTIGEN, URINE: Legionella Antigen, Urine: NEGATIVE

## 2012-09-05 MED ORDER — METOPROLOL TARTRATE 12.5 MG HALF TABLET: 12.5000 mg | ORAL_TABLET | Freq: Two times a day (BID) | ORAL | Status: DC

## 2012-09-05 MED ORDER — BENZONATATE 100 MG PO CAPS: 100.0000 mg | ORAL_CAPSULE | Freq: Three times a day (TID) | ORAL | Status: DC | PRN

## 2012-09-05 MED ORDER — ALBUTEROL SULFATE HFA 108 (90 BASE) MCG/ACT IN AERS: 2.0000 | INHALATION_SPRAY | Freq: Four times a day (QID) | RESPIRATORY_TRACT | Status: DC | PRN

## 2012-09-05 MED ORDER — LEVOFLOXACIN 750 MG PO TABS: 750.0000 mg | ORAL_TABLET | Freq: Every day | ORAL | Status: DC

## 2012-09-05 NOTE — Progress Notes (Signed)
Patient received vancomycin at 2000 and IV infilitrated, medication put on hold and given at 2125 after IV team had restarted IV.  Patient began to break out with small macules over lower left and right sides of abdomen.  Gave benadryl at 2257 and started admin of Maxipime right before.  Patient macules increased directly after admin of Maxipime.  I stopped that admin as well and gave 25mg  Benadryl IV, Patient was resting, will continue to monitor  Patient continues to wake with productive cough, recommend that tessalon be increased.

## 2012-09-05 NOTE — Progress Notes (Signed)
Discharge instructions given to pt, verbalized understanding. Left the unit in stable condition. 

## 2012-09-05 NOTE — Progress Notes (Signed)
Pt ambulated in hallway o2 sat 89-90% on ra while ambulating.

## 2012-09-05 NOTE — Discharge Summary (Signed)
Physician Discharge Summary  Christina Byrd WUJ:[id number] DOB: February 19, 1966 DOA: 09/02/2012  PCP: No primary provider on file.  Admit date: 09/02/2012 Discharge date: 09/05/2012  Time spent: > 35 minutes  Recommendations for Outpatient Follow-up:  1. Please follow up on sodium levels. Mildly decreased in hospital stay 2. Make further recommendations regarding hypertension. Patient recently started on B blocker  Discharge Diagnoses:  Principal Problem:   HCAP (healthcare-associated pneumonia) Active Problems:   TOBACCO USER   GASTROESOPHAGEAL REFLUX, NO ESOPHAGITIS   SOB (shortness of breath)   Hyponatremia   Discharge Condition: stable   Diet recommendation: low sodium diet  Filed Weights   09/02/12 0235 09/02/12 0535  Weight: 68.04 kg (150 lb) 69.4 kg (153 lb)    History of present illness:  47 y/o with h/o GERD that presented with SOB and was treated for pneumonia.  Hospital Course:   1. HCAP - Improved on IV antibiotics initially. Will be discharged on levaquin - Patient afebrile, breathing comfortably on room air - change albuterol to xopenex prn wheeze or cough q 6 hours  - urine legionella antigen negative, strep urine antigen negative  - will also discharge on albuterol prn sob to take as directed.  2. GERD  - stable patient to continue home regimen.  3. Hyponatremia  - most likely due to poor oral solute intake  - With continued improvement in po intake suspect that sodium level will improve.   4. HTN:  Improved on low dose metoprolol Will continue on discharge.   Procedures:  None  Consultations:  None  Discharge Exam: Filed Vitals:   09/05/12 0248 09/05/12 0306 09/05/12 0601 09/05/12 0946  BP: 120/82  123/77 133/90  Pulse: 94  96 91  Temp: 98.8 F (37.1 C)  98.7 F (37.1 C) 97.7 F (36.5 C)  TempSrc: Oral  Oral Oral  Resp: 18  18 18   Height:      Weight:      SpO2: 92% 94% 97% 100%    General: Pt in NAD, Alert and  Awake Cardiovascular: RRR, no MRG Respiratory: CTA BL, no wheezes, no increased work of breathing on room air.  Discharge Instructions  Discharge Orders   Future Orders Complete By Expires     Call MD for:  difficulty breathing, headache or visual disturbances  As directed     Call MD for:  temperature >100.4  As directed     Diet - low sodium heart healthy  As directed     Discharge instructions  As directed     Comments:      Please be sure to follow up with a primary care physician in 1-2 weeks or sooner should any new concerns arise.    Increase activity slowly  As directed         Medication List    STOP taking these medications       azithromycin 250 MG tablet  Commonly known as:  ZITHROMAX     NYQUIL PO      TAKE these medications       benzonatate 100 MG capsule  Commonly known as:  TESSALON  Take 1 capsule (100 mg total) by mouth 3 (three) times daily as needed for cough.     chlorpheniramine-HYDROcodone 10-8 MG/5ML Lqcr  Commonly known as:  TUSSIONEX  Take 5 mLs by mouth every 12 (twelve) hours.     lansoprazole 30 MG capsule  Commonly known as:  PREVACID  Take 30 mg by mouth as needed (  only use when eating spicy food).     levofloxacin 750 MG tablet  Commonly known as:  LEVAQUIN  Take 1 tablet (750 mg total) by mouth daily.  Start taking on:  09/06/2012     metoprolol tartrate 12.5 mg Tabs  Commonly known as:  LOPRESSOR  Take 0.5 tablets (12.5 mg total) by mouth 2 (two) times daily.     naproxen sodium 220 MG tablet  Commonly known as:  ANAPROX  Take 220 mg by mouth 2 (two) times daily with a meal.       Allergies  Allergen Reactions  . Ace Inhibitors Cough    REACTION: cough  . Cefepime Rash    09/04/12 pm Patient started to break out with small macules after IV Vanco infusion, then macules increased in size after starting cefepime infusion.  . Vancomycin Rash    09/04/12 pm Patient started to break out with small macules after IV Vanco  infusion, then macules increased in size after starting cefepime infusion.      The results of significant diagnostics from this hospitalization (including imaging, microbiology, ancillary and laboratory) are listed below for reference.    Significant Diagnostic Studies: Dg Chest 2 View  09/02/2012   *RADIOLOGY REPORT*  Clinical Data: Chest pain, cough, and shortness of breath.  CHEST - 2 VIEW  Comparison: 08/28/2012  Findings: Diffuse bilateral pulmonary airspace disease.  Airspace disease obscures visualization of the pulmonary vessels.  Heart size is normal.  No blunting of costophrenic angles.  No pneumothorax.  Changes appear similar previous study.  Changes could be due to pneumonia, hemorrhage, or other infiltrative process.  Edema is possible.  IMPRESSION: Diffuse bilateral airspace infiltrates throughout both lungs, similar to previous study.  No significant improvement.   Original Report Authenticated By: Burman Nieves, M.D.   Dg Chest 2 View  08/28/2012   *RADIOLOGY REPORT*  Clinical Data: Cough and chest pain for 72 hours.  CHEST - 2 VIEW  Comparison: None.  Findings: There is diffuse bilateral airspace infiltration throughout both lungs consistent with pneumonia, hemorrhage, or other infiltrative process.  Edema is less likely.  Normal heart size and pulmonary vascularity.  No blunting of costophrenic angles.  No pneumothorax.  Follow up after resolution of acute symptoms is recommended to exclude underlying neoplasm.  IMPRESSION: Diffuse airspace disease throughout both lungs.   Original Report Authenticated By: Burman Nieves, M.D.    Microbiology: No results found for this or any previous visit (from the past 240 hour(s)).   Labs: Basic Metabolic Panel:  Recent Labs Lab 09/02/12 0451 09/03/12 0545 09/04/12 0515  NA 137 133* 133*  K 4.3 4.1 4.5  CL 102 101 100  CO2  --  22 23  GLUCOSE 98 112* 119*  BUN 7 4* 4*  CREATININE 0.80 0.66 0.73  CALCIUM  --  9.1 8.8    Liver Function Tests: No results found for this basename: AST, ALT, ALKPHOS, BILITOT, PROT, ALBUMIN,  in the last 168 hours No results found for this basename: LIPASE, AMYLASE,  in the last 168 hours No results found for this basename: AMMONIA,  in the last 168 hours CBC:  Recent Labs Lab 09/02/12 0440 09/02/12 0451 09/03/12 0545 09/04/12 0515  WBC 12.1*  --  13.2* 11.8*  HGB 12.0 12.9 10.8* 10.3*  HCT 36.0 38.0 32.7* 31.3*  MCV 85.3  --  84.1 84.4  PLT 263  --  280 277   Cardiac Enzymes: No results found for this basename: CKTOTAL, CKMB,  CKMBINDEX, TROPONINI,  in the last 168 hours BNP: BNP (last 3 results)  Recent Labs  09/02/12 0440  PROBNP 2696.0*   CBG: No results found for this basename: GLUCAP,  in the last 168 hours     Signed:  Penny Pia  [facility] 09/05/2012, 1:01 PM

## 2012-09-19 ENCOUNTER — Ambulatory Visit: Payer: No Typology Code available for payment source | Attending: Family Medicine | Admitting: Internal Medicine

## 2012-09-19 ENCOUNTER — Other Ambulatory Visit: Payer: Self-pay | Admitting: *Deleted

## 2012-09-19 VITALS — BP 162/96 | HR 111 | Temp 97.8°F | Resp 16 | Wt 150.0 lb

## 2012-09-19 DIAGNOSIS — E871 Hypo-osmolality and hyponatremia: Secondary | ICD-10-CM

## 2012-09-19 DIAGNOSIS — I10 Essential (primary) hypertension: Secondary | ICD-10-CM

## 2012-09-19 DIAGNOSIS — J189 Pneumonia, unspecified organism: Secondary | ICD-10-CM

## 2012-09-19 MED ORDER — HYDRALAZINE HCL 10 MG PO TABS: 10.0000 mg | ORAL_TABLET | Freq: Three times a day (TID) | ORAL | Status: DC

## 2012-09-19 NOTE — Progress Notes (Signed)
Patient ID: Christina Byrd, female   DOB: Nov 27, 1965, 47 y.o.   MRN: [id number]  JENNAVIE MARTINEK JYN:[id number] DOB: 1965/09/30 DOA: (Not on file)  PCP: Standley Dakins, MD   Chief Complaint: Post hospitalization followup  HPI:  47 year old female wit history of hypertension, tobacco use who was admitted recently to the hospital for healthcare associated pneumonia and discharged on oral Levaquin comes here for a followup. Patient reports that she did not continue to take metoprolol as prescribed upon discharge her blood pressure as he started having leg swelling and stopped taking them after 2 days. She denies any headache, blurred vision. She denies further cough, shortness of breath, fever, chills, chest pain, palpitations, abdominal pain, bowel or urinary symptoms.  Review of Systems:  As outlined in HPI  Past Medical History  Diagnosis Date  . GERD (gastroesophageal reflux disease)    History reviewed. No pertinent past surgical history. Social History:  reports that she has been smoking.  She does not have any smokeless tobacco history on file. Her alcohol and drug histories are not on file.  Allergies  Allergen Reactions  . Ace Inhibitors Cough    REACTION: cough  . Cefepime Rash    09/04/12 pm Patient started to break out with small macules after IV Vanco infusion, then macules increased in size after starting cefepime infusion.  . Vancomycin Rash    09/04/12 pm Patient started to break out with small macules after IV Vanco infusion, then macules increased in size after starting cefepime infusion.    Family History  Problem Relation Age of Onset  . CAD Mother     Prior to Admission medications   Medication Sig Start Date End Date Taking? Authorizing Provider  albuterol (PROVENTIL HFA;VENTOLIN HFA) 108 (90 BASE) MCG/ACT inhaler Inhale 2 puffs into the lungs every 6 (six) hours as needed for wheezing. 09/05/12   Penny Pia, MD  benzonatate (TESSALON) 100 MG capsule Take  1 capsule (100 mg total) by mouth 3 (three) times daily as needed for cough. 09/05/12   Penny Pia, MD  chlorpheniramine-HYDROcodone (TUSSIONEX) 10-8 MG/5ML LQCR Take 5 mLs by mouth every 12 (twelve) hours. 08/28/12   Arman Filter, NP  lansoprazole (PREVACID) 30 MG capsule Take 30 mg by mouth as needed (only use when eating spicy food).    Historical Provider, MD  levofloxacin (LEVAQUIN) 750 MG tablet Take 1 tablet (750 mg total) by mouth daily. 09/06/12   Penny Pia, MD  metoprolol tartrate (LOPRESSOR) 12.5 mg TABS Take 0.5 tablets (12.5 mg total) by mouth 2 (two) times daily. 09/05/12   Penny Pia, MD  naproxen sodium (ANAPROX) 220 MG tablet Take 220 mg by mouth 2 (two) times daily with a meal.    Historical Provider, MD    Physical Exam:  Filed Vitals:   09/19/12 1034  BP: 162/96  Pulse: 111  Temp: 97.8 F (36.6 C)  Resp: 16  Weight: 150 lb (68.04 kg)  SpO2: 98%    Constitutional: Vital signs reviewed.  Patient is a well-developed and well-nourished in no acute distress and cooperative with exam.  HEENT: no pallor, moist oral mucosa Cardiovascular: RRR, S1 normal, S2 normal, no MRG Pulmonary/Chest: CTAB, no wheezes, rales, or rhonchi Abdominal: Soft. Non-tender, non-distended, bowel sounds are norma Ext: no edema Hematology: no cervical, inginal, or axillary adenopathy.  Neurological: A&O x3,   Labs  Recent labs with Na of 133.   Assessment/Plan Recent pneumonia  completed course of  abx with levaquin. No further symptoms  Hypertension Reports being on medications before but does not remember it. Was discharged on metoprolol but stopped taking it as it reports that she started having leg swelling. She is allergic to lisinopril and has mildly low Na so will not start HCTZ.  I will start her on hydralazine 10 mg po TID. Counseled on low salt diet and smoking cessation. Followup in 2 weeks for blood pressure monitoring.  Health maintenance Patient reports having a  mammogram and a Pap smear in the last 2 years.  Hanley Woerner    09/19/2012, 11:19 AM

## 2012-09-19 NOTE — Progress Notes (Signed)
Patient here for hospital follow up DX- Pnuemonia

## 2012-09-19 NOTE — Patient Instructions (Signed)

## 2012-10-03 ENCOUNTER — Telehealth: Payer: Self-pay | Admitting: Internal Medicine

## 2012-10-03 ENCOUNTER — Ambulatory Visit: Payer: No Typology Code available for payment source | Attending: Family Medicine | Admitting: Internal Medicine

## 2012-10-03 VITALS — BP 164/111 | HR 111 | Temp 98.0°F | Resp 18 | Ht 62.0 in | Wt 154.2 lb

## 2012-10-03 DIAGNOSIS — I10 Essential (primary) hypertension: Secondary | ICD-10-CM | POA: Insufficient documentation

## 2012-10-03 DIAGNOSIS — J449 Chronic obstructive pulmonary disease, unspecified: Secondary | ICD-10-CM

## 2012-10-03 DIAGNOSIS — R7309 Other abnormal glucose: Secondary | ICD-10-CM

## 2012-10-03 DIAGNOSIS — IMO0001 Reserved for inherently not codable concepts without codable children: Secondary | ICD-10-CM | POA: Insufficient documentation

## 2012-10-03 DIAGNOSIS — R739 Hyperglycemia, unspecified: Secondary | ICD-10-CM

## 2012-10-03 LAB — GLUCOSE, POCT (MANUAL RESULT ENTRY): POC Glucose: 131 mg/dl — AB (ref 70–99)

## 2012-10-03 MED ORDER — ALBUTEROL SULFATE (2.5 MG/3ML) 0.083% IN NEBU: 2.5000 mg | INHALATION_SOLUTION | Freq: Four times a day (QID) | RESPIRATORY_TRACT | Status: DC | PRN

## 2012-10-03 MED ORDER — HYDROCHLOROTHIAZIDE 25 MG PO TABS: 25.0000 mg | ORAL_TABLET | Freq: Every day | ORAL | Status: DC

## 2012-10-03 NOTE — Progress Notes (Signed)
Patient ID: Christina Byrd, female   DOB: Dec 11, 1965, 47 y.o.   MRN: [id number]  CC: Followup high blood pressure   HPI: Patient was seen back in clinic today for followup of high blood pressure. She has been on hydralazine 10 mg twice a day was to return today for repeat blood pressure. Blood pressure remains uncontrolled, she has no specific complaint, denies chest pain, denies headache, no visual impairment, still has occasional shortness of breath despite the use of inhaler. No cough. She quit smoking recently, but is to drink alcohol occasionally.  Allergies  Allergen Reactions  . Ace Inhibitors Cough    REACTION: cough  . Cefepime Rash    09/04/12 pm Patient started to break out with small macules after IV Vanco infusion, then macules increased in size after starting cefepime infusion.  . Vancomycin Rash    09/04/12 pm Patient started to break out with small macules after IV Vanco infusion, then macules increased in size after starting cefepime infusion.   Past Medical History  Diagnosis Date  . GERD (gastroesophageal reflux disease)    Current Outpatient Prescriptions on File Prior to Visit  Medication Sig Dispense Refill  . albuterol (PROVENTIL HFA;VENTOLIN HFA) 108 (90 BASE) MCG/ACT inhaler Inhale 2 puffs into the lungs every 6 (six) hours as needed for wheezing.  1 Inhaler  2  . hydrALAZINE (APRESOLINE) 10 MG tablet Take 1 tablet (10 mg total) by mouth 3 (three) times daily.  90 tablet  3  . benzonatate (TESSALON) 100 MG capsule Take 1 capsule (100 mg total) by mouth 3 (three) times daily as needed for cough.  20 capsule  0  . chlorpheniramine-HYDROcodone (TUSSIONEX) 10-8 MG/5ML LQCR Take 5 mLs by mouth every 12 (twelve) hours.  140 mL  0  . lansoprazole (PREVACID) 30 MG capsule Take 30 mg by mouth as needed (only use when eating spicy food).      Marland Kitchen levofloxacin (LEVAQUIN) 750 MG tablet Take 1 tablet (750 mg total) by mouth daily.  3 tablet  0  . naproxen sodium (ANAPROX) 220 MG  tablet Take 220 mg by mouth 2 (two) times daily with a meal.       No current facility-administered medications on file prior to visit.   Family History  Problem Relation Age of Onset  . CAD Mother    History   Social History  . Marital Status: Single    Spouse Name: N/A    Number of Children: N/A  . Years of Education: N/A   Occupational History  . Not on file.   Social History Main Topics  . Smoking status: Current Every Day Smoker  . Smokeless tobacco: Not on file  . Alcohol Use: Not on file  . Drug Use: Not on file  . Sexually Active: Not on file   Other Topics Concern  . Not on file   Social History Narrative  . No narrative on file    Review of Systems: Constitutional: Negative for fever, chills, diaphoresis, activity change, appetite change and fatigue. HENT: Negative for ear pain, nosebleeds, congestion, facial swelling, rhinorrhea, neck pain, neck stiffness and ear discharge.  Eyes: Negative for pain, discharge, redness, itching and visual disturbance. Respiratory: Negative for cough, choking, chest tightness, wheezing and stridor.  Cardiovascular: Negative for chest pain, palpitations and leg swelling. Gastrointestinal: Negative for abdominal distention. Genitourinary: Negative for dysuria, urgency, frequency, hematuria, flank pain, decreased urine volume, difficulty urinating and dyspareunia.  Musculoskeletal: Negative for back pain, joint swelling, arthralgias and  gait problem. Neurological: Negative for dizziness, tremors, seizures, syncope, facial asymmetry, speech difficulty, weakness, light-headedness, numbness and headaches.  Hematological: Negative for adenopathy. Does not bruise/bleed easily. Psychiatric/Behavioral: Negative for hallucinations, behavioral problems, confusion, dysphoric mood, decreased concentration and agitation.    Objective:   Filed Vitals:   10/03/12 0912  BP: 164/111  Pulse: 111  Temp: 98 F (36.7 C)  Resp: 18     Physical Exam: Constitutional: Patient appears well-developed and well-nourished. No distress. HENT: Normocephalic, atraumatic, External right and left ear normal. Oropharynx is clear and moist.  Eyes: Conjunctivae and EOM are normal. PERRLA, no scleral icterus. Neck: Normal ROM. Neck supple. No JVD. No tracheal deviation. No thyromegaly. CVS: RRR, S1/S2 +, no murmurs, no gallops, no carotid bruit.  Pulmonary: Effort and breath sounds normal, no stridor, rhonchi, wheezes, rales.  Abdominal: Soft. BS +,  no distension, tenderness, rebound or guarding.  Musculoskeletal: Normal range of motion. No edema and no tenderness.  Lymphadenopathy: No lymphadenopathy noted, cervical, inguinal or axillary Neuro: Alert. Normal reflexes, muscle tone coordination. No cranial nerve deficit. Skin: Skin is warm and dry. No rash noted. Not diaphoretic. No erythema. No pallor. Psychiatric: Normal mood and affect. Behavior, judgment, thought content normal.  Lab Results  Component Value Date   WBC 11.8* 09/04/2012   HGB 10.3* 09/04/2012   HCT 31.3* 09/04/2012   MCV 84.4 09/04/2012   PLT 277 09/04/2012   Lab Results  Component Value Date   CREATININE 0.73 09/04/2012   BUN 4* 09/04/2012   NA 133* 09/04/2012   K 4.5 09/04/2012   CL 100 09/04/2012   CO2 23 09/04/2012    Lab Results  Component Value Date   HGBA1C 5.6% 10/03/2012   Lipid Panel  No results found for this basename: chol, trig, hdl, cholhdl, vldl, ldlcalc       Assessment and plan:   Patient Active Problem List   Diagnosis Date Noted  . Uncontrolled hypertension 10/03/2012  . Hyperglycemia 10/03/2012  . 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 Hyponatremia 09/03/2012  . HCAP (healthcare-associated pneumonia) 09/02/2012  . SOB (shortness of breath) 09/02/2012  . TOBACCO USER 11/09/2008  . ACNE, MILD 12/06/2007  . OBESITY 08/12/2006  . HYPERTENSION, BENIGN ESSENTIAL 08/12/2006  . UTI 08/05/2006  . FLANK PAIN, RIGHT 08/05/2006  .  GASTROESOPHAGEAL REFLUX, NO ESOPHAGITIS 04/21/2006   To continue hydralazine at the current dose add hydrochlorothiazide 25 mg by mouth daily Albuterol nebulizer when necessary for shortness of breath Record ambulatory blood pressure check and bring to clinic in one month Will see patient in the clinic for followup in one month Repeat chest x-ray in one month to make sure pneumonia has completely resolved  [name] was given clear instructions to go to ER or return to the clinic if symptoms don't improve, worsen or new problems develop.  [name] verbalized understanding.  SENDY PLUTA was told to call to get lab results if hasn't heard anything in the next week.        Jeanann Lewandowsky, MD [facility] [place], [place] [phone]   10/03/2012, 12:19 PM

## 2012-10-03 NOTE — Progress Notes (Signed)
Patient ID: Christina Byrd, female   DOB: Nov 15, 1965, 47 y.o.   MRN: [id number]  CC: Follow up on HTN HPI:  - HTN- Hydralazine 10 mg TID causing patient GI upsets. Patient is currently taking it BID, with  improvement in side effects. BP 164/111 and upon rechecking BP is 152/100.  Adding HCTZ  25 mg to  Current regimen. Hydralazine to be continued at 10 mg BID.  - Blood glucose at 131, followed up with A1C  5.6- educated patient about proper diet and need  for regular exercise. - Shortness of breath- patient is recovering form pneumonia and complains of persistent  shortness of breath.  Continue albuterol inhaler.  Will refer patient to get Echocardiogram.   Allergies  Allergen Reactions  . Ace Inhibitors Cough    REACTION: cough  . Cefepime Rash    09/04/12 pm Patient started to break out with small macules after IV Vanco infusion, then macules increased in size after starting cefepime infusion.  . Vancomycin Rash    09/04/12 pm Patient started to break out with small macules after IV Vanco infusion, then macules increased in size after starting cefepime infusion.   Past Medical History  Diagnosis Date  . GERD (gastroesophageal reflux disease)    Current Outpatient Prescriptions on File Prior to Visit  Medication Sig Dispense Refill  . albuterol (PROVENTIL HFA;VENTOLIN HFA) 108 (90 BASE) MCG/ACT inhaler Inhale 2 puffs into the lungs every 6 (six) hours as needed for wheezing.  1 Inhaler  2  . hydrALAZINE (APRESOLINE) 10 MG tablet Take 1 tablet (10 mg total) by mouth 3 (three) times daily.  90 tablet  3  . benzonatate (TESSALON) 100 MG capsule Take 1 capsule (100 mg total) by mouth 3 (three) times daily as needed for cough.  20 capsule  0  . chlorpheniramine-HYDROcodone (TUSSIONEX) 10-8 MG/5ML LQCR Take 5 mLs by mouth every 12 (twelve) hours.  140 mL  0  . lansoprazole (PREVACID) 30 MG capsule Take 30 mg by mouth as needed (only use when eating spicy food).      Marland Kitchen levofloxacin (LEVAQUIN)  750 MG tablet Take 1 tablet (750 mg total) by mouth daily.  3 tablet  0  . naproxen sodium (ANAPROX) 220 MG tablet Take 220 mg by mouth 2 (two) times daily with a meal.       No current facility-administered medications on file prior to visit.   Family History  Problem Relation Age of Onset  . CAD Mother    History   Social History  . Marital Status: Single    Spouse Name: N/A    Number of Children: N/A  . Years of Education: N/A   Occupational History  . Not on file.   Social History Main Topics  . Smoking status: Current Every Day Smoker  . Smokeless tobacco: Not on file  . Alcohol Use: Not on file  . Drug Use: Not on file  . Sexually Active: Not on file   Other Topics Concern  . Not on file   Social History Narrative  . No narrative on file    Review of Systems ______ Constitutional: Negative for fever, chills, diaphoresis, activity change, appetite change and fatigue. ____ HENT: Negative for ear pain, nosebleeds, congestion, facial swelling, rhinorrhea, neck pain, neck stiffness and ear discharge.  ____ Eyes: Negative for pain, discharge, redness, itching and visual disturbance. ____ Respiratory: Negative for cough, choking, chest tightness, shortness of breath, wheezing and stridor.  ____ Cardiovascular: Negative for chest  pain, palpitations and leg swelling. ____ Gastrointestinal: Negative for abdominal distention. ____ Genitourinary: Negative for dysuria, urgency, frequency, hematuria, flank pain, decreased urine volume, difficulty urinating and dyspareunia. ____ Musculoskeletal: Negative for back pain, joint swelling, arthralgias and gait problem. ________ Neurological: Negative for dizziness, tremors, seizures, syncope, facial asymmetry, speech difficulty, weakness, light-headedness, numbness and headaches. ____ Hematological: Negative for adenopathy. Does not bruise/bleed easily. ____ Psychiatric/Behavioral: Negative for hallucinations, behavioral problems,  confusion, dysphoric mood, decreased concentration and agitation. ______   Objective:   Filed Vitals:   10/03/12 0912  BP: 164/111  Pulse: 111  Temp: 98 F (36.7 C)  Resp: 18    Physical Exam ______ Constitutional: Appears well-developed and well-nourished. No distress. ____ HENT: Normocephalic. External right and left ear normal. Oropharynx is clear and moist. ____ Eyes: Conjunctivae and EOM are normal. PERRLA, no scleral icterus. ____ Neck: Normal ROM. Neck supple. No JVD. No tracheal deviation. No thyromegaly. ____ CVS: RRR, S1/S2 +, no murmurs, no gallops, no carotid bruit.  Pulmonary: Effort and breath sounds normal, no stridor, rhonchi, wheezes, rales.  Abdominal: Soft. BS +,  no distension, tenderness, rebound or guarding. ________ Musculoskeletal: Normal range of motion. No edema and no tenderness. ____ Lymphadenopathy: No lymphadenopathy noted, cervical, inguinal. Neuro: Alert. Normal reflexes, muscle tone coordination. No cranial nerve deficit. Skin: Skin is warm and dry. No rash noted. Not diaphoretic. No erythema. No pallor. ____ Psychiatric: Normal mood and affect. Behavior, judgment, thought content normal. __  Lab Results  Component Value Date   WBC 11.8* 09/04/2012   HGB 10.3* 09/04/2012   HCT 31.3* 09/04/2012   MCV 84.4 09/04/2012   PLT 277 09/04/2012   Lab Results  Component Value Date   CREATININE 0.73 09/04/2012   BUN 4* 09/04/2012   NA 133* 09/04/2012   K 4.5 09/04/2012   CL 100 09/04/2012   CO2 23 09/04/2012    Lab Results  Component Value Date   HGBA1C 5.6% 10/03/2012   Lipid Panel  No results found for this basename: chol, trig, hdl, cholhdl, vldl, ldlcalc       Assessment and plan:   Patient Active Problem List   Diagnosis Date Noted  . Uncontrolled hypertension 10/03/2012  . Hyperglycemia 10/03/2012  . 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 Hyponatremia 09/03/2012  . HCAP (healthcare-associated pneumonia) 09/02/2012  . SOB (shortness of breath)  09/02/2012  . TOBACCO USER 11/09/2008  . ACNE, MILD 12/06/2007  . OBESITY 08/12/2006  . HYPERTENSION, BENIGN ESSENTIAL 08/12/2006  . UTI 08/05/2006  . FLANK PAIN, RIGHT 08/05/2006  . GASTROESOPHAGEAL REFLUX, NO ESOPHAGITIS 04/21/2006

## 2012-10-03 NOTE — Progress Notes (Signed)
Pt here s/p Pnemonia d/c'd with Levaquin medication f/u. C/O abdominal cramping x 2 weeks after BP meds increased to TID. Nonradiating.denies n/v/d.BP 164/111 pt requesting to change bp med to Metoprolol.

## 2012-10-04 ENCOUNTER — Other Ambulatory Visit (HOSPITAL_COMMUNITY): Payer: No Typology Code available for payment source

## 2012-10-09 ENCOUNTER — Ambulatory Visit (HOSPITAL_COMMUNITY)
Admission: RE | Admit: 2012-10-09 | Discharge: 2012-10-09 | Disposition: A | Discharge status: Home / Self Care | Payer: No Typology Code available for payment source | Source: Ambulatory Visit | Attending: Internal Medicine | Admitting: Internal Medicine

## 2012-10-09 DIAGNOSIS — R0609 Other forms of dyspnea: Secondary | ICD-10-CM | POA: Insufficient documentation

## 2012-10-09 DIAGNOSIS — I10 Essential (primary) hypertension: Secondary | ICD-10-CM | POA: Insufficient documentation

## 2012-10-09 DIAGNOSIS — F17200 Nicotine dependence, unspecified, uncomplicated: Secondary | ICD-10-CM | POA: Insufficient documentation

## 2012-10-09 DIAGNOSIS — K219 Gastro-esophageal reflux disease without esophagitis: Secondary | ICD-10-CM | POA: Insufficient documentation

## 2012-10-09 DIAGNOSIS — E669 Obesity, unspecified: Secondary | ICD-10-CM | POA: Insufficient documentation

## 2012-10-09 DIAGNOSIS — J449 Chronic obstructive pulmonary disease, unspecified: Secondary | ICD-10-CM | POA: Insufficient documentation

## 2012-10-09 DIAGNOSIS — R0989 Other specified symptoms and signs involving the circulatory and respiratory systems: Secondary | ICD-10-CM | POA: Insufficient documentation

## 2012-10-09 DIAGNOSIS — R0602 Shortness of breath: Secondary | ICD-10-CM

## 2012-10-09 DIAGNOSIS — J4489 Other specified chronic obstructive pulmonary disease: Secondary | ICD-10-CM | POA: Insufficient documentation

## 2012-10-09 NOTE — Progress Notes (Signed)
*  PRELIMINARY RESULTS* Echocardiogram 2D Echocardiogram has been performed.  Jeryl Columbia 10/09/2012, 2:52 PM

## 2012-10-12 ENCOUNTER — Telehealth: Payer: Self-pay | Admitting: Emergency Medicine

## 2012-10-12 NOTE — Telephone Encounter (Signed)
PT SCHEDULED TO COME IN SEPT. 4TH @ 10AM TO DISCUSS ECHO RESULTS AND CARDIOLOGY REFERRAL PER DOCTOR- JILL SMITH RN

## 2012-10-26 ENCOUNTER — Ambulatory Visit: Payer: No Typology Code available for payment source | Attending: Internal Medicine | Admitting: Internal Medicine

## 2012-10-26 ENCOUNTER — Encounter: Payer: Self-pay | Admitting: Internal Medicine

## 2012-10-26 VITALS — BP 158/113 | HR 106 | Temp 98.1°F | Resp 18 | Ht 62.0 in | Wt 153.0 lb

## 2012-10-26 DIAGNOSIS — I10 Essential (primary) hypertension: Secondary | ICD-10-CM

## 2012-10-26 DIAGNOSIS — IMO0001 Reserved for inherently not codable concepts without codable children: Secondary | ICD-10-CM

## 2012-10-26 DIAGNOSIS — J449 Chronic obstructive pulmonary disease, unspecified: Secondary | ICD-10-CM

## 2012-10-26 DIAGNOSIS — K219 Gastro-esophageal reflux disease without esophagitis: Secondary | ICD-10-CM

## 2012-10-26 DIAGNOSIS — I429 Cardiomyopathy, unspecified: Secondary | ICD-10-CM | POA: Insufficient documentation

## 2012-10-26 LAB — TSH: TSH: 4.074 u[IU]/mL (ref 0.350–4.500)

## 2012-10-26 LAB — LIPID PANEL
HDL: 82 mg/dL (ref >39)
Triglycerides: 230 mg/dL — ABNORMAL HIGH (ref <150)

## 2012-10-26 MED ORDER — ASPIRIN EC 81 MG PO TBEC: 81.0000 mg | DELAYED_RELEASE_TABLET | Freq: Every day | ORAL | Status: DC

## 2012-10-26 MED ORDER — CARVEDILOL 6.25 MG PO TABS: 6.2500 mg | ORAL_TABLET | Freq: Two times a day (BID) | ORAL | Status: DC

## 2012-10-26 NOTE — Progress Notes (Signed)
Patient ID: Christina Byrd, female   DOB: 08-06-1965, 46 y.o.   MRN: [id number] Patient Demographics  Christina Byrd, is a 47 y.o. female  WUJ:[id number]  [id number]  DOB - November 29, 1965  Chief Complaint  Patient presents with  . Follow-up    CARDIOLOGY REFERRAL, ECHO RESULTS  . Medication Problem    BP MEDS        Subjective:   Christina Byrd today is here for a follow up visit. Patient has No headache, No chest pain, No abdominal pain - No Nausea, No new weakness tingling or numbness, No Cough. States does not need the albuterol inhaler daily, shortness of breath, coughing and wheezing has improved. Was recently admitted in July/2014 for pneumonia. Patient's BP continues to be uncontrolled. Currently 158/113 with pulse of 106. Does not like hydralazine, states gives her abdominal cramps. 2-D echo results reviewed with her., EF 20-25% with LVH and diffuse hypokinesis     Objective:    Filed Vitals:   10/26/12 1025  BP: 158/113  Pulse: 106  Temp: 98.1 F (36.7 C)  TempSrc: Oral  Resp: 18  Height: 5\' 2"  (1.575 m)  Weight: 153 lb (69.4 kg)  SpO2: 100%     ALLERGIES:   Allergies  Allergen Reactions  . Ace Inhibitors Cough    REACTION: cough  . Cefepime Rash    09/04/12 pm Patient started to break out with small macules after IV Vanco infusion, then macules increased in size after starting cefepime infusion.  . Vancomycin Rash    09/04/12 pm Patient started to break out with small macules after IV Vanco infusion, then macules increased in size after starting cefepime infusion.    PAST MEDICAL HISTORY: Past Medical History  Diagnosis Date  . GERD (gastroesophageal reflux disease)     MEDICATIONS AT HOME: Prior to Admission medications   Medication Sig Start Date End Date Taking? Authorizing Provider  albuterol (PROVENTIL HFA;VENTOLIN HFA) 108 (90 BASE) MCG/ACT inhaler Inhale 2 puffs into the lungs every 6 (six) hours as needed for wheezing. 09/05/12  Yes  Penny Pia, MD  hydrochlorothiazide (HYDRODIURIL) 25 MG tablet Take 1 tablet (25 mg total) by mouth daily. 10/03/12  Yes Jeanann Lewandowsky, MD  lansoprazole (PREVACID) 30 MG capsule Take 30 mg by mouth as needed (only use when eating spicy food).   Yes Historical Provider, MD  albuterol (PROVENTIL) (2.5 MG/3ML) 0.083% nebulizer solution Take 3 mLs (2.5 mg total) by nebulization every 6 (six) hours as needed for wheezing. 10/03/12   Jeanann Lewandowsky, MD  aspirin EC 81 MG tablet Take 1 tablet (81 mg total) by mouth daily. 10/26/12   Gamble Enderle Jenna Luo, MD  benzonatate (TESSALON) 100 MG capsule Take 1 capsule (100 mg total) by mouth 3 (three) times daily as needed for cough. 09/05/12   Penny Pia, MD  carvedilol (COREG) 6.25 MG tablet Take 1 tablet (6.25 mg total) by mouth 2 (two) times daily with a meal. 10/26/12   Waino Mounsey Jenna Luo, MD  chlorpheniramine-HYDROcodone (TUSSIONEX) 10-8 MG/5ML LQCR Take 5 mLs by mouth every 12 (twelve) hours. 08/28/12   Arman Filter, NP  levofloxacin (LEVAQUIN) 750 MG tablet Take 1 tablet (750 mg total) by mouth daily. 09/06/12   Penny Pia, MD  naproxen sodium (ANAPROX) 220 MG tablet Take 220 mg by mouth 2 (two) times daily with a meal.    Historical Provider, MD     Exam  General appearance :Awake, alert, NAD, Speech Clear.  HEENT: Atraumatic and Normocephalic, PERLA Neck: supple,  no JVD. No cervical lymphadenopathy.  Chest: Clear to auscultation bilaterally, no wheezing, rales or rhonchi CVS:  tachycardia, regular rate and rhythm S1 S2 regular, no murmurs.  Abdomen: soft, NBS, NT, ND, no gaurding, rigidity or rebound. Extremities: no cyanosis or clubbing, B/L Lower Ext shows no edema Neurology: Awake alert, and oriented X 3, CN II-XII intact, Non focal Skin: No Rash or lesions Wounds:N/A    Data Review   Basic Metabolic Panel: No results found for this basename: NA, K, CL, CO2, GLUCOSE, BUN, CREATININE, CALCIUM, MG, PHOS,  in the last 168 hours Liver Function  Tests: No results found for this basename: AST, ALT, ALKPHOS, BILITOT, PROT, ALBUMIN,  in the last 168 hours  CBC: No results found for this basename: WBC, NEUTROABS, HGB, HCT, MCV, PLT,  in the last 168 hours  ------------------------------------------------------------------------------------------------------------------ No results found for this basename: HGBA1C,  in the last 72 hours ------------------------------------------------------------------------------------------------------------------ No results found for this basename: CHOL, HDL, LDLCALC, TRIG, CHOLHDL, LDLDIRECT,  in the last 72 hours ------------------------------------------------------------------------------------------------------------------ No results found for this basename: TSH, T4TOTAL, FREET3, T3FREE, THYROIDAB,  in the last 72 hours ------------------------------------------------------------------------------------------------------------------ No results found for this basename: VITAMINB12, FOLATE, FERRITIN, TIBC, IRON, RETICCTPCT,  in the last 72 hours  Coagulation profile  No results found for this basename: INR, PROTIME,  in the last 168 hours    Assessment & Plan   Active Problems: Uncontrolled hypertension  -2-D echo results reviewed with the patient, EF 20-25% with diffuse hypokinesis, LVH, possibly due to long-standing uncontrolled hypertension. Possibly cardiomyopathy from recent pneumonia and illness - Start on aspirin 81 mg daily, Coreg 6.25 mg BID titrate up as tolerated with BP, she is allergic to ACE inhibitors (patient however was not able to elaborate what her allergy symptoms are). DC hydralazine. Continue HCTZ 20 mg daily - Obtain TSH, lipid panel  - Recommended patient diet control, low salt diet - Ambulatory referral to cardiology, urgent   recent Pneumonia with bronchitis  - Patient has quit smoking  - Continue albuterol inhaler as needed    GERD : Continue PPI    Recommendations: follow up in 4 weeks     Kerney Hopfensperger M.D. 10/26/2012, 10:49 AM

## 2012-10-26 NOTE — Progress Notes (Signed)
PT HERE FOR F/U CARDIOLOGY REFERRAL WITH ECHO REPORT. POSS NEED BP MEDS CHANGED. BP 158/113 PT STATES AT HOME BP HIGH FORMER SMOKER X 4 MNTHS NO CP OR H/A

## 2012-11-03 ENCOUNTER — Ambulatory Visit: Payer: No Typology Code available for payment source | Admitting: Internal Medicine

## 2012-11-21 NOTE — Telephone Encounter (Signed)
Close encounter 

## 2012-11-27 ENCOUNTER — Ambulatory Visit: Payer: No Typology Code available for payment source | Admitting: Internal Medicine

## 2012-11-27 ENCOUNTER — Ambulatory Visit: Payer: No Typology Code available for payment source

## 2012-11-30 ENCOUNTER — Encounter: Payer: Self-pay | Admitting: Cardiovascular Disease

## 2012-11-30 ENCOUNTER — Ambulatory Visit (INDEPENDENT_AMBULATORY_CARE_PROVIDER_SITE_OTHER): Payer: No Typology Code available for payment source | Admitting: Cardiovascular Disease

## 2012-11-30 VITALS — BP 128/78 | HR 74 | Ht 62.0 in | Wt 150.0 lb

## 2012-11-30 DIAGNOSIS — I509 Heart failure, unspecified: Secondary | ICD-10-CM

## 2012-11-30 DIAGNOSIS — IMO0001 Reserved for inherently not codable concepts without codable children: Secondary | ICD-10-CM

## 2012-11-30 DIAGNOSIS — I10 Essential (primary) hypertension: Secondary | ICD-10-CM

## 2012-11-30 DIAGNOSIS — I429 Cardiomyopathy, unspecified: Secondary | ICD-10-CM

## 2012-11-30 DIAGNOSIS — J449 Chronic obstructive pulmonary disease, unspecified: Secondary | ICD-10-CM

## 2012-11-30 MED ORDER — LOSARTAN POTASSIUM 25 MG PO TABS: 25.0000 mg | ORAL_TABLET | Freq: Every day | ORAL | Status: DC

## 2012-11-30 NOTE — Assessment & Plan Note (Signed)
Quit smoking in May no active wheezing

## 2012-11-30 NOTE — Patient Instructions (Signed)
Your physician recommends that you schedule a follow-up appointment in: NEXT AVAILABLE Your physician has recommended you make the following change in your medication:  START LOSARTAN  25 MG  1  EVERY DAY  Your physician recommends that you return for lab work in: BMET  BNP  PRIOR  TO  APPT WITH DR Eden Emms

## 2012-11-30 NOTE — Progress Notes (Signed)
Patient ID: Christina Byrd, female   DOB: October 29, 1965, 47 y.o.   MRN: [id number] 47 yo referred by [facility] ( Dr Isidoro Donning and Dr Keane Scrape ) she is a previous smoker quit in May.  History of HTN  Hospitalized in July for dyspnea and ? Pneumonia BNP not checked F/U echo done 8/18 with EF 20-25% moderate MR Denies history of CHF ETOH excess.  No chest pain or previous MI  Family history of CAD but not DCM.  No real allergy to ACE  She had a cough but indicated it was from dust/allergies and she was not taking ACE at time.  Denies excess salt.  No edema PND or orthopnea now   ROS: Denies fever, malais, weight loss, blurry vision, decreased visual acuity, cough, sputum, SOB, hemoptysis, pleuritic pain, palpitaitons, heartburn, abdominal pain, melena, lower extremity edema, claudication, or rash.  All other systems reviewed and negative   General: Affect appropriate Healthy:  appears stated age HEENT: normal Neck supple with no adenopathy JVP normal no bruits no thyromegaly Lungs clear with no wheezing and good diaphragmatic motion Heart:  S1/S2 MR  murmur,rub, gallop or click PMI normal Abdomen: benighn, BS positve, no tenderness, no AAA no bruit.  No HSM or HJR Distal pulses intact with no bruits No edema Neuro non-focal Skin warm and dry No muscular weakness  Medications Current Outpatient Prescriptions  Medication Sig Dispense Refill  . aspirin EC 81 MG tablet Take 1 tablet (81 mg total) by mouth daily.      . carvedilol (COREG) 6.25 MG tablet Take 1 tablet (6.25 mg total) by mouth 2 (two) times daily with a meal.  60 tablet  3  . hydrochlorothiazide (HYDRODIURIL) 25 MG tablet Take 1 tablet (25 mg total) by mouth daily.  90 tablet  3  . losartan (COZAAR) 25 MG tablet Take 1 tablet (25 mg total) by mouth daily.  30 tablet  11   No current facility-administered medications for this visit.    Allergies Ace inhibitors; Cefepime; and Vancomycin  Family History: Family History   Problem Relation Age of Onset  . CAD Mother     Social History: History   Social History  . Marital Status: Single    Spouse Name: N/A    Number of Children: N/A  . Years of Education: N/A   Occupational History  . Not on file.   Social History Main Topics  . Smoking status: Former Smoker    Quit date: 06/25/2012  . Smokeless tobacco: Not on file  . Alcohol Use: 1.5 oz/week    3 drink(s) per week     Comment: ADMITS TO DRINKING 3-4 BEERS/DAY  . Drug Use: No  . Sexual Activity: Not on file   Other Topics Concern  . Not on file   Social History Narrative  . No narrative on file    Electrocardiogram:  SR rate 74  LVH with repol abnormality    Assessment and Plan

## 2012-11-30 NOTE — Assessment & Plan Note (Signed)
Do not think she has a real allergy to ACE  Start cozaar 25 mg to go with diuretic and coreg.  This medicine has best long term prognostic benefit if she can take it.  F/U BMET / BNP in 6 weeks Once on stable medical regimen can consider right /left heart cath vs MRI

## 2012-11-30 NOTE — Assessment & Plan Note (Signed)
Well controlled.  Continue current medications and low sodium Dash type diet.    

## 2012-12-06 ENCOUNTER — Telehealth: Payer: Self-pay | Admitting: Internal Medicine

## 2012-12-06 NOTE — Telephone Encounter (Signed)
Patient called to inform us that her Cardiologist Dr. Wendall Stade would like to speak with Dr. Hyman Hopes about Mrs. Hogue treatment, also Pt would like Dr. Hyman Hopes to know that Dr. Eden Emms has changed her blood pressure medication, to see how that one would work because she has a weak heart muscle, please contact Dr. Eden Emms for further information and Pt if she needs a follow up appointment

## 2012-12-18 ENCOUNTER — Ambulatory Visit (INDEPENDENT_AMBULATORY_CARE_PROVIDER_SITE_OTHER): Payer: No Typology Code available for payment source | Admitting: *Deleted

## 2012-12-18 DIAGNOSIS — R0602 Shortness of breath: Secondary | ICD-10-CM

## 2012-12-18 DIAGNOSIS — I509 Heart failure, unspecified: Secondary | ICD-10-CM

## 2012-12-18 LAB — BASIC METABOLIC PANEL
CO2: 24 mEq/L (ref 19–32)
Chloride: 102 mEq/L (ref 96–112)
Creatinine, Ser: 1 mg/dL (ref 0.4–1.2)

## 2012-12-18 LAB — BRAIN NATRIURETIC PEPTIDE: Pro B Natriuretic peptide (BNP): 184 pg/mL — ABNORMAL HIGH (ref 0.0–100.0)

## 2012-12-20 ENCOUNTER — Encounter: Payer: Self-pay | Admitting: Cardiovascular Disease

## 2012-12-20 ENCOUNTER — Ambulatory Visit (INDEPENDENT_AMBULATORY_CARE_PROVIDER_SITE_OTHER): Payer: No Typology Code available for payment source | Admitting: Cardiovascular Disease

## 2012-12-20 VITALS — BP 160/110 | HR 88 | Ht 62.0 in | Wt 151.0 lb

## 2012-12-20 DIAGNOSIS — J449 Chronic obstructive pulmonary disease, unspecified: Secondary | ICD-10-CM

## 2012-12-20 DIAGNOSIS — IMO0001 Reserved for inherently not codable concepts without codable children: Secondary | ICD-10-CM

## 2012-12-20 DIAGNOSIS — I10 Essential (primary) hypertension: Secondary | ICD-10-CM

## 2012-12-20 MED ORDER — OLMESARTAN MEDOXOMIL-HCTZ 40-12.5 MG PO TABS: 1.0000 | ORAL_TABLET | Freq: Every day | ORAL | Status: DC

## 2012-12-20 MED ORDER — POTASSIUM CHLORIDE ER 10 MEQ PO TBCR: 10.0000 meq | EXTENDED_RELEASE_TABLET | Freq: Every day | ORAL | Status: DC

## 2012-12-20 NOTE — Assessment & Plan Note (Signed)
Try Benicar HCTZ  Dies is better f/u 8 weeks BMET at that time since she is starting [employer]

## 2012-12-20 NOTE — Assessment & Plan Note (Signed)
Poorly controlled adding Benicar/HCTZ for DCM  If she cannot tolerate ARB may need bidil or hydralazine and nitrates

## 2012-12-20 NOTE — Assessment & Plan Note (Signed)
Stable no active wheezing

## 2012-12-20 NOTE — Patient Instructions (Addendum)
Your physician recommends that you schedule a follow-up appointment in: NEXT  AVAILABLE  WITH   DR [facility]  Your physician has recommended you make the following change in your medication: STOPPED LOSARTAN  STOP   HCTZ START  KDUR  10 MEQ   1  TAB   EVERY DAY  START  BENICAR 40 /12.5 MG   Your physician recommends that you return for lab work in:  BMET  BNP  IN 8 WEEKS   DUE   MID  December

## 2012-12-20 NOTE — Progress Notes (Signed)
Patient ID: Christina Byrd, female   DOB: 1965-05-28, 47 y.o.   MRN: [id number] 47 yo referred by [facility] ( Dr Isidoro Donning and Dr Keane Scrape ) she is a previous smoker quit in May. History of HTN Hospitalized in July for dyspnea and ? Pneumonia BNP not checked F/U echo done 8/18 with EF 20-25% moderate MR Denies history of CHF ETOH excess. No chest pain or previous MI Family history of CAD but not DCM. No real allergy to ACE She had a cough but indicated it was from dust/allergies and she was not taking ACE at time. Denies excess salt. No edema PND or orthopnea now  Tried cozaar but ? Facial pruritis.  Not clear if it was the medicine has she got her hair done the same time she started it.  Lab work shows she needs Engineer, materials.  Discussed long term importance of ARB for CHF  Samples of Benicar HCTZ given and she can hold her isolated diuretic      ROS: Denies fever, malais, weight loss, blurry vision, decreased visual acuity, cough, sputum, SOB, hemoptysis, pleuritic pain, palpitaitons, heartburn, abdominal pain, melena, lower extremity edema, claudication, or rash.  All other systems reviewed and negative  General: Affect appropriate Healthy:  appears stated age HEENT: normal Neck supple with no adenopathy JVP normal no bruits no thyromegaly Lungs clear with no wheezing and good diaphragmatic motion Heart:  S1/S2 no murmur, no rub, gallop or click PMI normal Abdomen: benighn, BS positve, no tenderness, no AAA no bruit.  No HSM or HJR Distal pulses intact with no bruits No edema Neuro non-focal Skin warm and dry No muscular weakness   Current Outpatient Prescriptions  Medication Sig Dispense Refill  . aspirin EC 81 MG tablet Take 1 tablet (81 mg total) by mouth daily.      . hydrochlorothiazide (HYDRODIURIL) 25 MG tablet Take 1 tablet (25 mg total) by mouth daily.  90 tablet  3   No current facility-administered medications for this visit.    Allergies  Losartan potassium; Ace  inhibitors; Cefepime; and Vancomycin  Electrocardiogram:  10/9/ SR rate 74 LVH  Assessment and Plan

## 2013-01-26 ENCOUNTER — Ambulatory Visit (INDEPENDENT_AMBULATORY_CARE_PROVIDER_SITE_OTHER): Payer: No Typology Code available for payment source | Admitting: Cardiovascular Disease

## 2013-01-26 ENCOUNTER — Encounter: Payer: Self-pay | Admitting: Cardiovascular Disease

## 2013-01-26 ENCOUNTER — Encounter: Payer: Self-pay | Admitting: *Deleted

## 2013-01-26 VITALS — BP 130/80 | HR 74 | Wt 154.0 lb

## 2013-01-26 DIAGNOSIS — I10 Essential (primary) hypertension: Secondary | ICD-10-CM

## 2013-01-26 DIAGNOSIS — I429 Cardiomyopathy, unspecified: Secondary | ICD-10-CM

## 2013-01-26 DIAGNOSIS — IMO0001 Reserved for inherently not codable concepts without codable children: Secondary | ICD-10-CM

## 2013-01-26 DIAGNOSIS — Z01810 Encounter for preprocedural cardiovascular examination: Secondary | ICD-10-CM

## 2013-01-26 DIAGNOSIS — J449 Chronic obstructive pulmonary disease, unspecified: Secondary | ICD-10-CM

## 2013-01-26 MED ORDER — OLMESARTAN MEDOXOMIL-HCTZ 40-12.5 MG PO TABS: 1.0000 | ORAL_TABLET | Freq: Every day | ORAL | Status: DC

## 2013-01-26 NOTE — Progress Notes (Signed)
Patient ID: Christina Byrd, female   DOB: 1966/01/07, 47 y.o.   MRN: [id number] 47 yo referred by [facility] ( Dr Isidoro Donning and Dr Keane Scrape ) she is a previous smoker quit in May. History of HTN Hospitalized in July for dyspnea and ? Pneumonia BNP not checked F/U echo done 8/18 with EF 20-25% moderate MR Denies history of CHF ETOH excess. No chest pain or previous MI Family history of CAD but not DCM. No real allergy to ACE She had a cough but indicated it was from dust/allergies and she was not taking ACE at time. Denies excess salt. No edema PND or orthopnea now  Tried cozaar but ? Facial pruritis. Not clear if it was the medicine has she got her hair done the same time she started it. Lab work shows she needs Engineer, materials. Discussed long term importance of ARB for CHF Samples of Benicar HCTZ given and she can hold her isolated diuretic  Seen in October and started on samples of Benicar /HCTZ  She claims to be taking.  Finances seem to be an issue.  F/U BNP much better 2600 to 160  K and Cr ok  She still has some exertional dyspnea but no PND/orthopnea  And no edema    Discussed right and left heart cath She prefers to have in January      ROS: Denies fever, malais, weight loss, blurry vision, decreased visual acuity, cough, sputum, SOB, hemoptysis, pleuritic pain, palpitaitons, heartburn, abdominal pain, melena, lower extremity edema, claudication, or rash.  All other systems reviewed and negative  General: Affect appropriate Healthy:  appears stated age HEENT: normal Neck supple with no adenopathy JVP normal no bruits no thyromegaly Lungs clear with no wheezing and good diaphragmatic motion Heart:  S1/S2 no murmur, no rub, gallop or click PMI normal Abdomen: benighn, BS positve, no tenderness, no AAA no bruit.  No HSM or HJR Distal pulses intact with no bruits No edema Neuro non-focal Skin warm and dry No muscular weakness   Current Outpatient Prescriptions  Medication Sig Dispense  Refill  . aspirin EC 81 MG tablet Take 1 tablet (81 mg total) by mouth daily.      Marland Kitchen olmesartan-hydrochlorothiazide (BENICAR HCT) 40-12.5 MG per tablet Take 1 tablet by mouth daily.  30 tablet  11  . potassium chloride (K-DUR) 10 MEQ tablet Take 1 tablet (10 mEq total) by mouth daily.  90 tablet  3   No current facility-administered medications for this visit.    Allergies  Losartan potassium; Ace inhibitors; Cefepime; and Vancomycin  Electrocardiogram:  SR rate 74 LVH nonspecific ST/T wave changes   Assessment and Plan

## 2013-01-26 NOTE — Patient Instructions (Signed)
Your physician recommends that you schedule a follow-up appointment in: CHF  CLINC  NEXT  AVAILABLE   AND   6 MONTHS WITH DR Eden Emms Your physician recommends that you continue on your current medications as directed. Please refer to the Current Medication list given to you today.  Your physician has requested that you have a cardiac catheterization. Cardiac catheterization is used to diagnose and/or treat various heart conditions. Doctors may recommend this procedure for a number of different reasons. The most common reason is to evaluate chest pain. Chest pain can be a symptom of coronary artery disease (CAD), and cardiac catheterization can show whether plaque is narrowing or blocking your heart's arteries. This procedure is also used to evaluate the valves, as well as measure the blood flow and oxygen levels in different parts of your heart. For further information please visit [email or web address] Please follow instruction sheet, as given.

## 2013-01-26 NOTE — Assessment & Plan Note (Signed)
Well controlled.  Continue current medications and low sodium Dash type diet.    

## 2013-01-26 NOTE — Assessment & Plan Note (Signed)
Indicates quitting smoking in May No active wheezing

## 2013-01-26 NOTE — Addendum Note (Signed)
Addended by: Scherrie Bateman E on: 01/26/2013 12:01 PM   Modules accepted: Orders

## 2013-01-26 NOTE — Assessment & Plan Note (Signed)
Right and left cath with me or DB in January.  It will be hard to her on multiple drugs. Samples of Benicar HCTZ  40/12.5 given Script call in Consider adding coreg if pressures still high at cath

## 2013-01-31 ENCOUNTER — Other Ambulatory Visit: Payer: Self-pay

## 2013-01-31 MED ORDER — OLMESARTAN MEDOXOMIL-HCTZ 40-12.5 MG PO TABS: 1.0000 | ORAL_TABLET | Freq: Every day | ORAL | Status: DC

## 2013-02-02 NOTE — Progress Notes (Signed)
Patient ID: Christina Byrd, female   DOB: [birth year], 47 y.o.   MRN: [id number]  Primary Cardiologist: Dr. Eden Emms Primary PCP: [facility]  HPI:  Christina Byrd is a 47 yo patient of Dr. Eden Emms that was referred to the HF clinic for management of her HF. She has a history of HTN, tobacco abuse, GERD and chronic systolic HF.   She was admitted to the hospital in 08/2012 with increased SOB and was thought to have PNA. She was treated with antibiotics and was sent home. She continued to have SOB and ECHO 09/2012 showed EF 20-25% with moderate MR.  Was seen by Dr. Eden Emms on 01/26/13 and was given samples of benicar/HCTZ 40/12.5 mg and was scheduled for [facility] in January 2015. Reports can't afford medications and one medication was $24 and she thought was coreg so has only been taking once a day. Was on Cozaar however stopped d/t facial pruritis. Developed cough with ACE-I. Feeling pretty good. Denies SOB, orthopnea, PND, or CP. + DOE after about 25-50 yards. Does not weigh. Does not smoke. Drinks about 1 beer a night and may have a few shots of liquor at night. Has been compliant with anti-HTN meds  SH: Quit smoking 8 months ago. Drinks a beer a night and occasional shots of liquor. 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   ROS: All systems negative except as listed in HPI, PMH and Problem List.  Past Medical History  Diagnosis Date  . GERD (gastroesophageal reflux disease)     Current Outpatient Prescriptions  Medication Sig Dispense Refill  . aspirin EC 81 MG tablet Take 1 tablet (81 mg total) by mouth daily.      . carvedilol (COREG) 6.25 MG tablet Take 6.25 mg by mouth daily.      . hydrochlorothiazide (HYDRODIURIL) 25 MG tablet Take 25 mg by mouth daily.      Marland Kitchen olmesartan-hydrochlorothiazide (BENICAR HCT) 40-12.5 MG per tablet Take 1 tablet by mouth daily.  30 tablet  11  . omeprazole (PRILOSEC) 20 MG capsule Take 20 mg by mouth daily.      . potassium chloride (K-DUR) 10 MEQ  tablet Take 1 tablet (10 mEq total) by mouth daily.  90 tablet  3   No current facility-administered medications for this encounter.    Filed Vitals:   02/05/13 1202  BP: 152/96  Pulse: 100  Height: 5\' 2"  (1.575 m)  Weight: 154 lb 12.8 oz (70.217 kg)  SpO2: 99%    PHYSICAL EXAM: General:  Well appearing. No resp difficulty HEENT: normal Neck: supple. JVP 7 Carotids 2+ bilaterally; no bruits. No lymphadenopathy or thryomegaly appreciated. Cor: PMI normal. Regular rate & rhythm. No rubs, gallops or murmurs. Lungs: clear Abdomen: soft, nontender, nondistended. No hepatosplenomegaly. No bruits or masses. Good bowel sounds. Extremities: no cyanosis, clubbing, rash, edema Neuro: alert & orientedx3, cranial nerves grossly intact. Moves all 4 extremities w/o difficulty. Affect pleasant.   ASSESSMENT & PLAN:  1) Chronic systolic HF: EF 20-25% (09/2012) - NYHA II symptoms. Volume status is stable. Will continue HCTX 25 mg daily.  - Etiology of HF unclear, possibly related to long standing HTN. She is scheduled for [facility] in 02/2013, however Dr. Gala Romney did bedside ECHO and EF looks 55-60% with nl RV.  - She does not have insurance and can not afford Benicar. She did not tolerate ACE-I in the past d/t cough and she reports rash with losartan. Will try hydralazine and nitrates. Start hydralazine 50 mg TID and  IMDUR 30 mg daily. Will provide through HF meds.  - Increase coreg to 6.25 mg BID (4$ medication) - Will not start spiro with all the different medication changes, but can consider at next visit. - Lengthy discussion with patient about the role of the HF clinic. Discussed the need for her to weigh daily, take her medications as prescribed, follow a low salt diet < 2gm a day and drink less than 2 L a day. Will set her up with Social Worker to help with Medicaid application. 2) HTN - elevated. As above will change to hydralazine and nitrates and titrate coreg. 3) Tobacco abuse - Has not  smoked in 8 months, congratulated her on this and encouraged her to continue to abstain. 4) ETOH  - Reports drinking a beer a night and sometimes having 1-2 shots of liquor at night. Discussed cutting back to 1 drink per night or abstaining completely.  F/U 6-8 weeks with ECHO. [name] B NP-C 1:11 PM  Patient seen and examined with [name], NP. We discussed all aspects of the encounter. I agree with the assessment and plan as stated above.   Patient much improved now NYHA I-II. I did bedside echo and EF now normalized. 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 Agreed with med changes today to help her afford meds. Stressed need to watch BP closely and continue to be compliant with meds. Can cancel R & LHC. Will order formal echo. Will see one more time in HF Clinic and then can be referred back to primary. Counseled on need for ETOH cessation and to remain quit form cigarettes.  [name] 7:46 PM

## 2013-02-05 ENCOUNTER — Encounter (HOSPITAL_COMMUNITY): Payer: Self-pay

## 2013-02-05 ENCOUNTER — Ambulatory Visit (HOSPITAL_COMMUNITY)
Admission: RE | Admit: 2013-02-05 | Discharge: 2013-02-05 | Disposition: A | Discharge status: Home / Self Care | Payer: No Typology Code available for payment source | Source: Ambulatory Visit | Attending: Internal Medicine | Admitting: Internal Medicine

## 2013-02-05 VITALS — BP 152/96 | HR 100 | Ht 62.0 in | Wt 154.8 lb

## 2013-02-05 DIAGNOSIS — I5022 Chronic systolic (congestive) heart failure: Secondary | ICD-10-CM

## 2013-02-05 DIAGNOSIS — I10 Essential (primary) hypertension: Secondary | ICD-10-CM

## 2013-02-05 LAB — BASIC METABOLIC PANEL
BUN: 20 mg/dL (ref 6–23)
GFR calc non Af Amer: >90 mL/min (ref >90)
Glucose, Bld: 132 mg/dL — ABNORMAL HIGH (ref 70–99)
Potassium: 3.9 mEq/L (ref 3.5–5.1)

## 2013-02-05 MED ORDER — HYDRALAZINE HCL 50 MG PO TABS: 50.0000 mg | ORAL_TABLET | Freq: Three times a day (TID) | ORAL | Status: DC

## 2013-02-05 MED ORDER — CARVEDILOL 6.25 MG PO TABS: 6.2500 mg | ORAL_TABLET | Freq: Two times a day (BID) | ORAL | Status: DC

## 2013-02-05 MED ORDER — ISOSORBIDE MONONITRATE ER 30 MG PO TB24: 30.0000 mg | ORAL_TABLET | Freq: Every day | ORAL | Status: DC

## 2013-02-05 NOTE — Patient Instructions (Addendum)
Stop taking Benicar/HCT.  Start taking hydralazine 50 mg three times a day and IMDUR 30 mg once a day. Pick your prescription up at the [facility]. Go down [employer] and before you get to the [place] light take a Left.  Increase your coreg to 6.25 mg twice a day.  Come back tomorrow to meet with our Child psychotherapist.  Follow up 6-8 weeks with ECHO.  Call any issues [phone]  Do the following things EVERYDAY: 1) Weigh yourself in the morning before breakfast. Write it down and keep it in a log. 2) Take your medicines as prescribed 3) Eat low salt foods-Limit salt (sodium) to 2000 mg per day.  4) Stay as active as you can everyday 5) Limit all fluids for the day to less than 2 liters

## 2013-02-06 ENCOUNTER — Encounter: Payer: Self-pay | Admitting: Licensed Clinical Social Worker

## 2013-02-06 NOTE — Progress Notes (Signed)
CSW referred to meet with Ms. Rampy to assist with insurance coverage. Patient reports she currently has an [employer] and receives [employer].She reports she is working with [employer] [facility]) [phone] for Disability (SSI/SSD) application. She states that she has no high school education and has difficulty with reading and writing.She currently is unemployed although does some side work to financially support herself and pay her rent. She lives alone and has rent of $425. She reported that she has a long history of supporting herself and budgets well. She was familiar with many of the community resources like the food pantries and local churches to reduce her monthly expenses. CSW encouraged continued follow up with disability application and follow up with social services for additional resources. CSW encouraged patient to return visit to CSW if further assistance is needed. Lasandra Beech, LCSW [phone]

## 2013-02-08 ENCOUNTER — Telehealth (HOSPITAL_COMMUNITY): Payer: Self-pay | Admitting: Cardiology

## 2013-02-08 DIAGNOSIS — I5022 Chronic systolic (congestive) heart failure: Secondary | ICD-10-CM

## 2013-02-08 MED ORDER — CARVEDILOL 6.25 MG PO TABS: 6.2500 mg | ORAL_TABLET | Freq: Two times a day (BID) | ORAL | Status: DC

## 2013-02-08 NOTE — Telephone Encounter (Signed)
PTS RX WAS SENT TO THE WRONG PHARMACY NEW RX SENT TO THE CORRECT PHARM

## 2013-02-12 ENCOUNTER — Other Ambulatory Visit: Payer: No Typology Code available for payment source

## 2013-02-26 ENCOUNTER — Ambulatory Visit: Payer: Self-pay | Attending: Internal Medicine

## 2013-02-26 ENCOUNTER — Other Ambulatory Visit: Payer: Self-pay

## 2013-02-26 DIAGNOSIS — Z23 Encounter for immunization: Secondary | ICD-10-CM

## 2013-02-26 DIAGNOSIS — Z01810 Encounter for preprocedural cardiovascular examination: Secondary | ICD-10-CM

## 2013-02-26 LAB — CBC WITH DIFFERENTIAL/PLATELET
BASOS ABS: 0 10*3/uL (ref 0.0–0.1)
BASOS PCT: 1 % (ref 0–1)
Eosinophils Absolute: 0.2 10*3/uL (ref 0.0–0.7)
Eosinophils Relative: 3 % (ref 0–5)
HEMATOCRIT: 34.2 % — AB (ref 36.0–46.0)
Hemoglobin: 11.2 g/dL — ABNORMAL LOW (ref 12.0–15.0)
Lymphocytes Relative: 39 % (ref 12–46)
Lymphs Abs: 1.8 10*3/uL (ref 0.7–4.0)
MCH: 29.2 pg (ref 26.0–34.0)
MCHC: 32.7 g/dL (ref 30.0–36.0)
MCV: 89.3 fL (ref 78.0–100.0)
MONO ABS: 0.4 10*3/uL (ref 0.1–1.0)
Monocytes Relative: 9 % (ref 3–12)
NEUTROS ABS: 2.2 10*3/uL (ref 1.7–7.7)
Neutrophils Relative %: 48 % (ref 43–77)
Platelets: 294 10*3/uL (ref 150–400)
RBC: 3.83 MIL/uL — ABNORMAL LOW (ref 3.87–5.11)
RDW: 15 % (ref 11.5–15.5)
WBC: 4.6 10*3/uL (ref 4.0–10.5)

## 2013-02-26 LAB — PROTIME-INR
INR: 1.06 (ref <1.50)
Prothrombin Time: 13.7 seconds (ref 11.6–15.2)

## 2013-02-27 DIAGNOSIS — Z23 Encounter for immunization: Secondary | ICD-10-CM

## 2013-02-28 ENCOUNTER — Ambulatory Visit: Payer: Self-pay

## 2013-03-02 ENCOUNTER — Encounter (HOSPITAL_COMMUNITY): Payer: Self-pay

## 2013-03-02 ENCOUNTER — Ambulatory Visit (HOSPITAL_COMMUNITY): Admit: 2013-03-02 | Payer: Self-pay | Admitting: Internal Medicine

## 2013-03-02 SURGERY — LEFT AND RIGHT HEART CATHETERIZATION WITH CORONARY ANGIOGRAM
Anesthesia: LOCAL

## 2013-03-08 ENCOUNTER — Encounter: Payer: Self-pay | Admitting: Internal Medicine

## 2013-03-08 ENCOUNTER — Ambulatory Visit: Payer: No Typology Code available for payment source | Attending: Internal Medicine | Admitting: Internal Medicine

## 2013-03-08 VITALS — BP 189/101 | HR 101 | Temp 98.7°F | Resp 14 | Ht 62.0 in | Wt 156.6 lb

## 2013-03-08 DIAGNOSIS — I5022 Chronic systolic (congestive) heart failure: Secondary | ICD-10-CM

## 2013-03-08 DIAGNOSIS — I509 Heart failure, unspecified: Secondary | ICD-10-CM | POA: Insufficient documentation

## 2013-03-08 MED ORDER — OMEPRAZOLE 20 MG PO CPDR: 20.0000 mg | DELAYED_RELEASE_CAPSULE | Freq: Every day | ORAL | Status: DC

## 2013-03-08 NOTE — Progress Notes (Signed)
Pt is here for a f/u. Has some questions about current medications, causing throbbing headaches that elevates x1 week. Taking Aleve for headaches and doesn't work. No headache today. Stop taking medication Tuesday.

## 2013-03-08 NOTE — Addendum Note (Signed)
Addended by: Susie Cassette MD, Germain Osgood on: 03/08/2013 11:55 AM   Modules accepted: Orders

## 2013-03-08 NOTE — Progress Notes (Signed)
Patient ID: Christina Byrd, female   DOB: 1965-04-14, 48 y.o.   MRN: [id number]   CC:  HPI:  48 year old female with history of congestive heart failure followed by the heart failure clinic, history of tobacco abuse, hypertension, hospitalized 7/14 with increased shortness of breath and pneumonia treated with antibiotics and sent home she had a 2-D echo that showed an EF of 20-25%. She has a left and right heart cath scheduled for January 2015 on 1/22. The patient could not afford her medications and developed a cough with ACE inhibitor. Today she denies any shortness of breath orthopnea or chest pain.  She was recently placed on hydralazine and Imdur, she could not afford Benicar and did not tolerate ACE inhibitor in the past due to cough. She reports rash with losartan.   Allergies  Allergen Reactions  . Losartan Potassium     Rash  . Levaquin [Levofloxacin In D5w] Rash  . Ace Inhibitors Cough    REACTION: cough  . Cefepime Rash    09/04/12 pm Patient started to break out with small macules after IV Vanco infusion, then macules increased in size after starting cefepime infusion.  . Vancomycin Rash    09/04/12 pm Patient started to break out with small macules after IV Vanco infusion, then macules increased in size after starting cefepime infusion.   Past Medical History  Diagnosis Date  . GERD (gastroesophageal reflux disease)    Current Outpatient Prescriptions on File Prior to Visit  Medication Sig Dispense Refill  . aspirin EC 81 MG tablet Take 1 tablet (81 mg total) by mouth daily.      . carvedilol (COREG) 6.25 MG tablet Take 1 tablet (6.25 mg total) by mouth 2 (two) times daily with a meal.  60 tablet  6  . hydrALAZINE (APRESOLINE) 50 MG tablet Take 1 tablet (50 mg total) by mouth 3 (three) times daily.  90 tablet  6  . hydrochlorothiazide (HYDRODIURIL) 25 MG tablet Take 25 mg by mouth daily.      . isosorbide mononitrate (IMDUR) 30 MG 24 hr tablet Take 1 tablet (30 mg  total) by mouth daily.  30 tablet  6  . potassium chloride (K-DUR) 10 MEQ tablet Take 1 tablet (10 mEq total) by mouth daily.  90 tablet  3  . omeprazole (PRILOSEC) 20 MG capsule Take 20 mg by mouth daily.       No current facility-administered medications on file prior to visit.   Family History  Problem Relation Age of Onset  . CAD Mother    History   Social History  . Marital Status: Single    Spouse Name: N/A    Number of Children: N/A  . Years of Education: N/A   Occupational History  . Not on file.   Social History Main Topics  . Smoking status: Former Smoker    Quit date: 06/25/2012  . Smokeless tobacco: Not on file  . Alcohol Use: 1.5 oz/week    3 drink(s) per week     Comment: ADMITS TO DRINKING 3-4 BEERS/DAY  . Drug Use: No  . Sexual Activity: Not on file   Other Topics Concern  . Not on file   Social History Narrative  . No narrative on file    Review of Systems  Constitutional: Negative for fever, chills, diaphoresis, activity change, appetite change and fatigue.  HENT: Negative for ear pain, nosebleeds, congestion, facial swelling, rhinorrhea, neck pain, neck stiffness and ear discharge.   Eyes: Negative for  pain, discharge, redness, itching and visual disturbance.  Respiratory: Negative for cough, choking, chest tightness, shortness of breath, wheezing and stridor.   Cardiovascular: Negative for chest pain, palpitations and leg swelling.  Gastrointestinal: Negative for abdominal distention.  Genitourinary: Negative for dysuria, urgency, frequency, hematuria, flank pain, decreased urine volume, difficulty urinating and dyspareunia.  Musculoskeletal: Negative for back pain, joint swelling, arthralgias and gait problem.  Neurological: Negative for dizziness, tremors, seizures, syncope, facial asymmetry, speech difficulty, weakness, light-headedness, numbness and headaches.  Hematological: Negative for adenopathy. Does not bruise/bleed easily.   Psychiatric/Behavioral: Negative for hallucinations, behavioral problems, confusion, dysphoric mood, decreased concentration and agitation.    Objective:   Filed Vitals:   03/08/13 1109  BP: 189/101  Pulse: 101  Temp: 98.7 F (37.1 C)  Resp: 14    Physical Exam  Constitutional: Appears well-developed and well-nourished. No distress.  HENT: Normocephalic. External right and left ear normal. Oropharynx is clear and moist.  Eyes: Conjunctivae and EOM are normal. PERRLA, no scleral icterus.  Neck: Normal ROM. Neck supple. No JVD. No tracheal deviation. No thyromegaly.  CVS: RRR, S1/S2 +, no murmurs, no gallops, no carotid bruit.  Pulmonary: Effort and breath sounds normal, no stridor, rhonchi, wheezes, rales.  Abdominal: Soft. BS +,  no distension, tenderness, rebound or guarding.  Musculoskeletal: Normal range of motion. No edema and no tenderness.  Lymphadenopathy: No lymphadenopathy noted, cervical, inguinal. Neuro: Alert. Normal reflexes, muscle tone coordination. No cranial nerve deficit. Skin: Skin is warm and dry. No rash noted. Not diaphoretic. No erythema. No pallor.  Psychiatric: Normal mood and affect. Behavior, judgment, thought content normal.   Lab Results  Component Value Date   WBC 4.6 02/26/2013   HGB 11.2* 02/26/2013   HCT 34.2* 02/26/2013   MCV 89.3 02/26/2013   PLT 294 02/26/2013   Lab Results  Component Value Date   CREATININE 0.75 02/05/2013   BUN 20 02/05/2013   NA 139 02/05/2013   K 3.9 02/05/2013   CL 105 02/05/2013   CO2 21 02/05/2013    Lab Results  Component Value Date   HGBA1C 5.6% 10/03/2012   Lipid Panel     Component Value Date/Time   CHOL 194 10/26/2012 1052   TRIG 230* 10/26/2012 1052   HDL 82 10/26/2012 1052   CHOLHDL 2.4 10/26/2012 1052   VLDL 46* 10/26/2012 1052   LDLCALC 66 10/26/2012 1052       Assessment and plan:   Patient Active Problem List   Diagnosis Date Noted  . Chronic systolic heart failure 02/05/2013  . Dilated  cardiomyopathy, EF 20-25% 10/26/2012  . Uncontrolled hypertension 10/03/2012  . Hyperglycemia 10/03/2012  . 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 Hyponatremia 09/03/2012  . HCAP (healthcare-associated pneumonia) 09/02/2012  . SOB (shortness of breath) 09/02/2012  . TOBACCO USER 11/09/2008  . ACNE, MILD 12/06/2007  . OBESITY 08/12/2006  . HYPERTENSION, BENIGN ESSENTIAL 08/12/2006  . UTI 08/05/2006  . FLANK PAIN, RIGHT 08/05/2006  . GASTROESOPHAGEAL REFLUX, NO ESOPHAGITIS 04/21/2006   Chronic systolic heart failure Patient has a headache with hydralazine and Imdur She will call over to the heart failure clinic today to discuss if she could stop these medications She has a followup appointment with them on 1/22      Followup in 2 months  The patient was given clear instructions to go to ER or return to medical center if symptoms don't improve, worsen or new problems develop. The patient verbalized understanding. The patient was told to call to get any  lab results if not heard anything in the next week.

## 2013-03-13 ENCOUNTER — Telehealth (HOSPITAL_COMMUNITY): Payer: Self-pay | Admitting: Cardiology

## 2013-03-13 NOTE — Telephone Encounter (Signed)
Pt called to inform providers she d/c'd imdur and hydralizine on 03/09/13 Pt states both meds gave her a HA

## 2013-03-14 NOTE — Telephone Encounter (Signed)
Spoke w/pt, she states she has had no HA since stopping meds, pt is sch to see [place] tomorrow for f/u will hold meds and address at that appt

## 2013-03-15 ENCOUNTER — Ambulatory Visit (HOSPITAL_COMMUNITY)
Admission: RE | Admit: 2013-03-15 | Discharge: 2013-03-15 | Disposition: A | Discharge status: Home / Self Care | Payer: No Typology Code available for payment source | Source: Ambulatory Visit | Attending: Internal Medicine | Admitting: Internal Medicine

## 2013-03-15 ENCOUNTER — Encounter (HOSPITAL_COMMUNITY): Payer: Self-pay

## 2013-03-15 ENCOUNTER — Ambulatory Visit (HOSPITAL_BASED_OUTPATIENT_CLINIC_OR_DEPARTMENT_OTHER)
Admission: RE | Admit: 2013-03-15 | Discharge: 2013-03-15 | Disposition: A | Discharge status: Home / Self Care | Payer: No Typology Code available for payment source | Source: Ambulatory Visit | Attending: Internal Medicine | Admitting: Internal Medicine

## 2013-03-15 VITALS — BP 180/104 | HR 90 | Wt 160.1 lb

## 2013-03-15 DIAGNOSIS — I517 Cardiomegaly: Secondary | ICD-10-CM

## 2013-03-15 DIAGNOSIS — I5022 Chronic systolic (congestive) heart failure: Secondary | ICD-10-CM

## 2013-03-15 DIAGNOSIS — I509 Heart failure, unspecified: Secondary | ICD-10-CM | POA: Insufficient documentation

## 2013-03-15 DIAGNOSIS — I10 Essential (primary) hypertension: Secondary | ICD-10-CM

## 2013-03-15 DIAGNOSIS — Z8249 Family history of ischemic heart disease and other diseases of the circulatory system: Secondary | ICD-10-CM | POA: Insufficient documentation

## 2013-03-15 DIAGNOSIS — J449 Chronic obstructive pulmonary disease, unspecified: Secondary | ICD-10-CM | POA: Insufficient documentation

## 2013-03-15 DIAGNOSIS — J4489 Other specified chronic obstructive pulmonary disease: Secondary | ICD-10-CM | POA: Insufficient documentation

## 2013-03-15 MED ORDER — LOSARTAN POTASSIUM 50 MG PO TABS
50.0000 mg | ORAL_TABLET | Freq: Every day | ORAL | Status: DC
Start: 2013-03-15 — End: 2013-03-15

## 2013-03-15 MED ORDER — AMLODIPINE BESYLATE 10 MG PO TABS: 10.0000 mg | ORAL_TABLET | Freq: Every day | ORAL | Status: DC

## 2013-03-15 NOTE — Progress Notes (Signed)
  Echocardiogram 2D Echocardiogram has been performed.  Christina Byrd 03/15/2013, 12:00 PM

## 2013-03-15 NOTE — Patient Instructions (Signed)
Take amlodipine 10 mg daily  Follow up in 3 months  Do the following things EVERYDAY: 1) Weigh yourself in the morning before breakfast. Write it down and keep it in a log. 2) Take your medicines as prescribed 3) Eat low salt foods-Limit salt (sodium) to 2000 mg per day.  4) Stay as active as you can everyday 5) Limit all fluids for the day to less than 2 liters

## 2013-03-15 NOTE — Progress Notes (Signed)
Patient ID: Christina Byrd, female   DOB: [birth year], 48 y.o.   MRN: [id number]  Primary Cardiologist: Dr. Eden Emms Primary PCP: [facility]  HPI: Christina Byrd is a 48 yo patient of Dr. Eden Emms that was referred to the HF clinic 48 yo for management of her HF. She has a history of HTN, tobacco abuse, GERD and chronic systolic HF.   She was admitted to the hospital in 08/2012 with increased SOB and was thought to have PNA. She was treated with antibiotics and was sent home. She continued to have SOB and ECHO 09/2012 showed EF 20-25% with moderate MR.  Was seen by Dr. Eden Emms on 01/26/13 and was given samples of benicar/HCTZ 40/12.5 mg and was scheduled for [facility] in January 2015. Reports can't afford medications and one medication was $24 and she thought was coreg so has only been taking once a day. Was on Cozaar however stopped d/t facial pruritis. Developed cough with ACE-I.   She returns for follow up. Last visit she was started on hydralazine 50 mg TID/IMDUR 30 mg daily, and also carvedilol was increased to 6.25 mg bid. She stopped taking hydralazine and Imdur due to headaches. Complains of increased cough and fatigue.  Productive cough with green sputum. This being followed by her PCP. She has not been taking hydralazine/Imdur due to headache. Not weighing at home. Taking all meds.   ECHO 03/15/13 EF 45-50%   SH: Quit smoking 8 months ago. Drinks a beer a night and occasional shots of liquor. 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   ROS: All systems negative except as listed in HPI, PMH and Problem List.  Past Medical History  Diagnosis Date  . GERD (gastroesophageal reflux disease)     Current Outpatient Prescriptions  Medication Sig Dispense Refill  . carvedilol (COREG) 6.25 MG tablet Take 1 tablet (6.25 mg total) by mouth 2 (two) times daily with a meal.  60 tablet  6  . hydrochlorothiazide (HYDRODIURIL) 25 MG tablet Take 25 mg by mouth daily.      Marland Kitchen omeprazole (PRILOSEC) 20 MG  capsule Take 1 capsule (20 mg total) by mouth daily.  60 capsule  2  . potassium chloride (K-DUR) 10 MEQ tablet Take 1 tablet (10 mEq total) by mouth daily.  90 tablet  3  . aspirin EC 81 MG tablet Take 1 tablet (81 mg total) by mouth daily.       No current facility-administered medications for this encounter.    Filed Vitals:   03/15/13 1209  BP: 180/104  Pulse: 90  Weight: 160 lb 1.9 oz (72.63 kg)  SpO2: 100%    PHYSICAL EXAM: General:  Well appearing. No resp difficulty HEENT: normal Neck: supple. JVP 5-6  Carotids 2+ bilaterally; no bruits. No lymphadenopathy or thryomegaly appreciated. Cor: PMI normal. Regular rate & rhythm. No rubs, gallops or murmurs. Lungs: clear Abdomen: soft, nontender, nondistended. No hepatosplenomegaly. No bruits or masses. Good bowel sounds. Extremities: no cyanosis, clubbing, rash, edema Neuro: alert & orientedx3, cranial nerves grossly intact. Moves all 4 extremities w/o difficulty. Affect pleasant.   ASSESSMENT & PLAN:  1) Chronic systolic HF: EF 20-25% (09/2012) Etiology of HF unclear, possibly related to long standing HTN. Dr Gala Romney reviewed and discussed ECHO. EF improved to 45-50%   - NYHA II symptoms. Volume status is stable. Will continue HCTX 25 mg daily.  Continue 6.25 mg carvedilol twice a day.  Off hydralazine/Imdur due to headache.Start amlodipine 10 mg daily. No on an ace due to cough.  No on losartan due to rash.  Reinforced low salt food choices, limiting fluid intake to < 2 liters per day, and medication compliance.   2) HTN- elevated. Hydralazine/Imdur stopped due to headache. Continue current dose of carvedilol and HCTZ. Add amlodipine 10 mg daily.  Follow up as needed in the HF clinic. Instructed to follow up with Dr Eden EmmsNishan.  CLEGG,AMY NP-C 12:14 PM  Patient seen and examined with Tonye BecketAmy Clegg, NP. We discussed all aspects of the encounter. I agree with the assessment and plan as stated above.   I reviewed echo personally.  EF much improved. Now 45-50%. Suspect she has hypertensive CM. She is feeling much better. Unable to tolerate ACE/ARB/hydral/NTG. Will continue carvedilol. Add amlodipine. Continue carvedilol . Will refer back to Dr. Eden EmmsNishan for f/u. Can add spiro +/- clonidine to manage BP as needed.    \Christina Madaris,MD 8:26 PM

## 2013-05-02 ENCOUNTER — Other Ambulatory Visit: Payer: Self-pay | Admitting: Cardiovascular Disease

## 2013-05-07 ENCOUNTER — Ambulatory Visit: Payer: No Typology Code available for payment source | Admitting: Internal Medicine

## 2013-05-15 ENCOUNTER — Ambulatory Visit: Payer: No Typology Code available for payment source | Attending: Internal Medicine | Admitting: Internal Medicine

## 2013-05-15 ENCOUNTER — Encounter: Payer: Self-pay | Admitting: Internal Medicine

## 2013-05-15 VITALS — BP 124/86 | HR 101 | Temp 97.8°F | Resp 16 | Ht 62.0 in | Wt 162.0 lb

## 2013-05-15 DIAGNOSIS — R51 Headache: Secondary | ICD-10-CM | POA: Insufficient documentation

## 2013-05-15 DIAGNOSIS — I509 Heart failure, unspecified: Secondary | ICD-10-CM

## 2013-05-15 DIAGNOSIS — J4489 Other specified chronic obstructive pulmonary disease: Secondary | ICD-10-CM | POA: Insufficient documentation

## 2013-05-15 DIAGNOSIS — Z87891 Personal history of nicotine dependence: Secondary | ICD-10-CM | POA: Insufficient documentation

## 2013-05-15 DIAGNOSIS — Z79899 Other long term (current) drug therapy: Secondary | ICD-10-CM | POA: Insufficient documentation

## 2013-05-15 DIAGNOSIS — J449 Chronic obstructive pulmonary disease, unspecified: Secondary | ICD-10-CM | POA: Insufficient documentation

## 2013-05-15 DIAGNOSIS — I10 Essential (primary) hypertension: Secondary | ICD-10-CM | POA: Insufficient documentation

## 2013-05-15 DIAGNOSIS — Z7982 Long term (current) use of aspirin: Secondary | ICD-10-CM | POA: Insufficient documentation

## 2013-05-15 DIAGNOSIS — Z888 Allergy status to other drugs, medicaments and biological substances status: Secondary | ICD-10-CM | POA: Insufficient documentation

## 2013-05-15 DIAGNOSIS — I5022 Chronic systolic (congestive) heart failure: Secondary | ICD-10-CM | POA: Insufficient documentation

## 2013-05-15 DIAGNOSIS — K219 Gastro-esophageal reflux disease without esophagitis: Secondary | ICD-10-CM | POA: Insufficient documentation

## 2013-05-15 MED ORDER — OMEPRAZOLE 20 MG PO CPDR: 40.0000 mg | DELAYED_RELEASE_CAPSULE | Freq: Every day | ORAL | Status: DC

## 2013-05-15 NOTE — Progress Notes (Signed)
Pt here to f/u htn,cad with medication management Pt is doing very wel with dieting and exercising Compliant with taking all medications BP-124/86 101

## 2013-05-15 NOTE — Progress Notes (Signed)
Patient ID: Christina Byrd, female   DOB: [birth year], 48 y.o.   MRN: [id number]   CC:  HPI: 48 yo patient of Dr. Eden Emms history of chronic systolic heart failure here for a followup. She has a history of HTN, tobacco abuse, GERD and chronic systolic HF. 09/2012 showed EF 91-47% with moderate MR.Last visit she was started on hydralazine 50 mg TID/IMDUR 30 mg daily, and also carvedilol was increased to 6.25 mg bid. She stopped taking hydralazine and Imdur due to headaches. Unable to tolerate ACE/ARB/hydral/NTG.  ECHO 03/15/13 EF 45-50%  Because of improvement in ejection fraction the heart failure clinic recommended a sterile hydralazine/Imdur and start Norvasc 10 mg daily. Patient is doing extremely well no complaints today. She is going to set up an appointment with Dr. Eden Emms.           Allergies  Allergen Reactions  . Losartan Potassium     Rash  . Levaquin [Levofloxacin In D5w] Rash  . Ace Inhibitors Cough    REACTION: cough  . Cefepime Rash    09/04/12 pm Patient started to break out with small macules after IV Vanco infusion, then macules increased in size after starting cefepime infusion.  . Vancomycin Rash    09/04/12 pm Patient started to break out with small macules after IV Vanco infusion, then macules increased in size after starting cefepime infusion.   Past Medical History  Diagnosis Date  . GERD (gastroesophageal reflux disease)    Current Outpatient Prescriptions on File Prior to Visit  Medication Sig Dispense Refill  . amLODipine (NORVASC) 10 MG tablet Take 1 tablet (10 mg total) by mouth daily.  30 tablet  6  . aspirin EC 81 MG tablet Take 1 tablet (81 mg total) by mouth daily.      . carvedilol (COREG) 6.25 MG tablet TAKE 1 TABLET BY MOUTH TWICE DAILY.  60 tablet  6  . hydrochlorothiazide (HYDRODIURIL) 25 MG tablet Take 25 mg by mouth daily.      . potassium chloride (K-DUR) 10 MEQ tablet Take 1 tablet (10 mEq total) by mouth daily.  90 tablet  3   No  current facility-administered medications on file prior to visit.   Family History  Problem Relation Age of Onset  . CAD Mother    History   Social History  . Marital Status: Single    Spouse Name: N/A    Number of Children: N/A  . Years of Education: N/A   Occupational History  . Not on file.   Social History Main Topics  . Smoking status: Former Smoker    Quit date: 06/25/2012  . Smokeless tobacco: Not on file  . Alcohol Use: 1.5 oz/week    3 drink(s) per week     Comment: ADMITS TO DRINKING 3-4 BEERS/DAY  . Drug Use: No  . Sexual Activity: Not on file   Other Topics Concern  . Not on file   Social History Narrative  . No narrative on file    Review of Systems  Constitutional: Negative for fever, chills, diaphoresis, activity change, appetite change and fatigue.  HENT: Negative for ear pain, nosebleeds, congestion, facial swelling, rhinorrhea, neck pain, neck stiffness and ear discharge.   Eyes: Negative for pain, discharge, redness, itching and visual disturbance.  Respiratory: Negative for cough, choking, chest tightness, shortness of breath, wheezing and stridor.   Cardiovascular: Negative for chest pain, palpitations and leg swelling.  Gastrointestinal: Negative for abdominal distention.  Genitourinary: Negative for dysuria, urgency, frequency,  hematuria, flank pain, decreased urine volume, difficulty urinating and dyspareunia.  Musculoskeletal: Negative for back pain, joint swelling, arthralgias and gait problem.  Neurological: Negative for dizziness, tremors, seizures, syncope, facial asymmetry, speech difficulty, weakness, light-headedness, numbness and headaches.  Hematological: Negative for adenopathy. Does not bruise/bleed easily.  Psychiatric/Behavioral: Negative for hallucinations, behavioral problems, confusion, dysphoric mood, decreased concentration and agitation.    Objective:   Filed Vitals:   05/15/13 1044  BP: 124/86  Pulse: 101  Temp: 97.8  F (36.6 C)  Resp: 16    Physical Exam  Constitutional: Appears well-developed and well-nourished. No distress.  HENT: Normocephalic. External right and left ear normal. Oropharynx is clear and moist.  Eyes: Conjunctivae and EOM are normal. PERRLA, no scleral icterus.  Neck: Normal ROM. Neck supple. No JVD. No tracheal deviation. No thyromegaly.  CVS: RRR, S1/S2 +, no murmurs, no gallops, no carotid bruit.  Pulmonary: Effort and breath sounds normal, no stridor, rhonchi, wheezes, rales.  Abdominal: Soft. BS +,  no distension, tenderness, rebound or guarding.  Musculoskeletal: Normal range of motion. No edema and no tenderness.  Lymphadenopathy: No lymphadenopathy noted, cervical, inguinal. Neuro: Alert. Normal reflexes, muscle tone coordination. No cranial nerve deficit. Skin: Skin is warm and dry. No rash noted. Not diaphoretic. No erythema. No pallor.  Psychiatric: Normal mood and affect. Behavior, judgment, thought content normal.   Lab Results  Component Value Date   WBC 4.6 02/26/2013   HGB 11.2* 02/26/2013   HCT 34.2* 02/26/2013   MCV 89.3 02/26/2013   PLT 294 02/26/2013   Lab Results  Component Value Date   CREATININE 0.75 02/05/2013   BUN 20 02/05/2013   NA 139 02/05/2013   K 3.9 02/05/2013   CL 105 02/05/2013   CO2 21 02/05/2013    Lab Results  Component Value Date   HGBA1C 5.6% 10/03/2012   Lipid Panel     Component Value Date/Time   CHOL 194 10/26/2012 1052   TRIG 230* 10/26/2012 1052   HDL 82 10/26/2012 1052   CHOLHDL 2.4 10/26/2012 1052   VLDL 46* 10/26/2012 1052   LDLCALC 66 10/26/2012 1052       Assessment and plan:   Patient Active Problem List   Diagnosis Date Noted  . Chronic systolic heart failure 02/05/2013  . Dilated cardiomyopathy, EF 20-25% 10/26/2012  . Uncontrolled hypertension 10/03/2012  . Hyperglycemia 10/03/2012  . 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 Hyponatremia 09/03/2012  . HCAP (healthcare-associated pneumonia) 09/02/2012  . SOB (shortness of  breath) 09/02/2012  . TOBACCO USER 11/09/2008  . ACNE, MILD 12/06/2007  . OBESITY 08/12/2006  . HYPERTENSION, BENIGN ESSENTIAL 08/12/2006  . UTI 08/05/2006  . FLANK PAIN, RIGHT 08/05/2006  . GASTROESOPHAGEAL REFLUX, NO ESOPHAGITIS 04/21/2006       Chronic systolic heart failure Currently on optimal therapy initiated by the heart failure clinic Patient will follow up for general cardiology Dr. Eden EmmsNishan.   Gastroesophageal reflux disease Patient will be given a refill for omeprazole 40 mg once a day  Patient doing well follow up in 3 months for heart failure     The patient was given clear instructions to go to ER or return to medical center if symptoms don't improve, worsen or new problems develop. The patient verbalized understanding. The patient was told to call to get any lab results if not heard anything in the next week.

## 2013-06-20 ENCOUNTER — Telehealth: Payer: Self-pay | Admitting: Internal Medicine

## 2013-06-20 ENCOUNTER — Encounter: Payer: Self-pay | Admitting: Family Medicine

## 2013-06-20 ENCOUNTER — Ambulatory Visit: Payer: No Typology Code available for payment source | Attending: Family Medicine | Admitting: Family Medicine

## 2013-06-20 VITALS — BP 115/77 | HR 94 | Temp 100.1°F | Ht 62.0 in | Wt 162.6 lb

## 2013-06-20 DIAGNOSIS — R300 Dysuria: Secondary | ICD-10-CM

## 2013-06-20 LAB — POCT URINALYSIS DIPSTICK
BILIRUBIN UA: NEGATIVE
Glucose, UA: NEGATIVE
Nitrite, UA: NEGATIVE
PH UA: 6.5
Protein, UA: 100
Spec Grav, UA: 1.02
Urobilinogen, UA: 1

## 2013-06-20 MED ORDER — SULFAMETHOXAZOLE-TMP DS 800-160 MG PO TABS: 1.0000 | ORAL_TABLET | Freq: Two times a day (BID) | ORAL | Status: DC

## 2013-06-20 NOTE — Telephone Encounter (Signed)
Returned patient's call regarding painful urination and tingling feeling after urination. Patient given same day appointment for [id number] at 4:45 PM. Reather Laurence, RN

## 2013-06-20 NOTE — Patient Instructions (Addendum)
Thank you for coming in today.  I will prescribe an antibiotic called Bactrim for your UTI.  Take all of the prescribed antibiotic 2 times a day for 5 days even if symptoms have improved.  You can take this medication at same time as your other medications.  Symptoms should improve in 2-3 days.  Contact us if you have any fever or rash.

## 2013-06-20 NOTE — Progress Notes (Signed)
   Subjective:    Patient ID: Christina Byrd, female    DOB: 1965-12-10, 48 y.o.   MRN: [id number]  HPI DYSURIA  Pain urinating started 2 days ago. Pain is: at end of urination Medications tried: none Any antibiotics in the last 30 days: no More than 3 UTIs in the last 12 months: no   Had bladder infection several years ago that felt similar STD exposure: no Possibly pregnant: no  Symptoms Urgency: yes Frequency: yes Blood in urine: no Pain in back:yes Fever: yes Vaginal discharge: no Mouth Ulcers: no   Review of Symptoms - see HPI PMH - Smoking status noted.       Review of Systems     Objective:   Physical Exam  Alert nad No CVA tenderness. Abdomen mild lower suparpubic tenderness palpation makes her feel like urinating.       Assessment & Plan:   UTI - symptoms and UA consistent with.  Will treat with bactrim given possible cefepime allergy

## 2013-06-20 NOTE — Telephone Encounter (Signed)
Pt called states that she is having painful Urination and tingling feeling after she Urinates, Please contact pt

## 2013-08-16 ENCOUNTER — Ambulatory Visit: Payer: No Typology Code available for payment source | Admitting: Internal Medicine

## 2013-08-21 ENCOUNTER — Ambulatory Visit: Payer: No Typology Code available for payment source

## 2013-08-21 VITALS — BP 142/89 | HR 90 | Temp 98.5°F | Resp 16 | Ht 62.0 in | Wt 166.8 lb

## 2013-08-21 MED ORDER — TRAMADOL HCL 50 MG PO TABS: 50.0000 mg | ORAL_TABLET | Freq: Three times a day (TID) | ORAL | Status: DC | PRN

## 2013-08-21 NOTE — Patient Instructions (Signed)

## 2013-08-21 NOTE — Progress Notes (Unsigned)
Patient presents with c/o left knee pain for one week with some swelling. Patient states she did not injure her knee. Patient states its hurts more when she bends and extends the knee. Patient has tired elevation and heat with no relief. Patient states she has not taken her BP medication today. Consulted with Cletus Gash, NP who prescribed Tramadol 50 mg three times a day as needed. Reinforced RICE method with patient and to keep her follow up appointment on 09/06/2013. Patient verbalized understanding. Annamaria Helling, RN

## 2013-09-06 ENCOUNTER — Ambulatory Visit: Payer: Self-pay | Attending: Internal Medicine | Admitting: Internal Medicine

## 2013-09-06 ENCOUNTER — Encounter: Payer: Self-pay | Admitting: Internal Medicine

## 2013-09-06 ENCOUNTER — Ambulatory Visit (HOSPITAL_COMMUNITY)
Admission: RE | Admit: 2013-09-06 | Discharge: 2013-09-06 | Disposition: A | Discharge status: Home / Self Care | Payer: No Typology Code available for payment source | Source: Ambulatory Visit | Attending: Internal Medicine | Admitting: Internal Medicine

## 2013-09-06 VITALS — BP 144/98 | HR 92 | Temp 98.7°F | Resp 16 | Ht 62.0 in | Wt 165.0 lb

## 2013-09-06 DIAGNOSIS — I509 Heart failure, unspecified: Secondary | ICD-10-CM

## 2013-09-06 DIAGNOSIS — M25562 Pain in left knee: Secondary | ICD-10-CM

## 2013-09-06 DIAGNOSIS — I5022 Chronic systolic (congestive) heart failure: Secondary | ICD-10-CM

## 2013-09-06 DIAGNOSIS — M25569 Pain in unspecified knee: Secondary | ICD-10-CM

## 2013-09-06 DIAGNOSIS — I10 Essential (primary) hypertension: Secondary | ICD-10-CM | POA: Insufficient documentation

## 2013-09-06 DIAGNOSIS — Z87891 Personal history of nicotine dependence: Secondary | ICD-10-CM | POA: Insufficient documentation

## 2013-09-06 DIAGNOSIS — M25469 Effusion, unspecified knee: Secondary | ICD-10-CM | POA: Insufficient documentation

## 2013-09-06 DIAGNOSIS — R7309 Other abnormal glucose: Secondary | ICD-10-CM | POA: Insufficient documentation

## 2013-09-06 MED ORDER — HYDROCHLOROTHIAZIDE 25 MG PO TABS: 25.0000 mg | ORAL_TABLET | Freq: Every day | ORAL | Status: DC

## 2013-09-06 MED ORDER — AMLODIPINE BESYLATE 10 MG PO TABS: 10.0000 mg | ORAL_TABLET | Freq: Every day | ORAL | Status: DC

## 2013-09-06 MED ORDER — TRAMADOL HCL 50 MG PO TABS: 50.0000 mg | ORAL_TABLET | Freq: Three times a day (TID) | ORAL | Status: DC | PRN

## 2013-09-06 MED ORDER — POTASSIUM CHLORIDE ER 10 MEQ PO TBCR: 10.0000 meq | EXTENDED_RELEASE_TABLET | Freq: Every day | ORAL | Status: DC

## 2013-09-06 MED ORDER — CARVEDILOL 6.25 MG PO TABS: ORAL_TABLET | ORAL | Status: DC

## 2013-09-06 MED ORDER — OMEPRAZOLE 20 MG PO CPDR: 40.0000 mg | DELAYED_RELEASE_CAPSULE | Freq: Every day | ORAL | Status: DC

## 2013-09-06 NOTE — Progress Notes (Signed)
Pt is here following up on her HTN and her chronic left knee pain.

## 2013-09-06 NOTE — Progress Notes (Signed)
Patient ID: Christina Byrd, female   DOB: 01/31/1966, 48 y.o.   MRN: [id number]   CC: Left knee pain  HPI: Patient is 48 year old female with history of hypertension, presenting to clinic with main concern of left knee pain. She explains the pain has been present for several weeks, throbbing and constant, 5/10 in severity, associated with difficulty with ambulation. Patient denies any specific alleviating factors. Patient reports she feels like there is some fluid in the knee. She denies any traumas to the knee. No similar events in the past.  Allergies  Allergen Reactions  . Losartan Potassium     Rash  . Levaquin [Levofloxacin In D5w] Rash  . Ace Inhibitors Cough    REACTION: cough  . Cefepime Rash    09/04/12 pm Patient started to break out with small macules after IV Vanco infusion, then macules increased in size after starting cefepime infusion.  . Vancomycin Rash    09/04/12 pm Patient started to break out with small macules after IV Vanco infusion, then macules increased in size after starting cefepime infusion.   Past Medical History  Diagnosis Date  . GERD (gastroesophageal reflux disease)    Current Outpatient Prescriptions on File Prior to Visit  Medication Sig Dispense Refill  . aspirin EC 81 MG tablet Take 1 tablet (81 mg total) by mouth daily.      Marland Kitchen. sulfamethoxazole-trimethoprim (BACTRIM DS) 800-160 MG per tablet Take 1 tablet by mouth 2 (two) times daily.  10 tablet  0   No current facility-administered medications on file prior to visit.   Family History  Problem Relation Age of Onset  . CAD Mother    History   Social History  . Marital Status: Single    Spouse Name: N/A    Number of Children: N/A  . Years of Education: N/A   Occupational History  . Not on file.   Social History Main Topics  . Smoking status: Former Smoker    Quit date: 06/25/2012  . Smokeless tobacco: Not on file  . Alcohol Use: 1.5 oz/week    3 drink(s) per week     Comment:  ADMITS TO DRINKING 3-4 BEERS/DAY  . Drug Use: No  . Sexual Activity: Not on file   Other Topics Concern  . Not on file   Social History Narrative  . No narrative on file    Review of Systems  Constitutional: Negative for fever, chills, diaphoresis, activity change, appetite change and fatigue.  HENT: Negative for ear pain, nosebleeds, congestion, facial swelling, rhinorrhea, neck pain, neck stiffness and ear discharge.   Eyes: Negative for pain, discharge, redness, itching and visual disturbance.  Respiratory: Negative for cough, choking, chest tightness, shortness of breath, wheezing and stridor.   Cardiovascular: Negative for chest pain, palpitations and leg swelling.  Gastrointestinal: Negative for abdominal distention.  Genitourinary: Negative for dysuria, urgency, frequency, hematuria, flank pain, decreased urine volume, difficulty urinating and dyspareunia.  Musculoskeletal: Negative for back pain Neurological: Negative for dizziness, tremors, seizures, syncope, facial asymmetry, speech difficulty, weakness, light-headedness, numbness and headaches.  Hematological: Negative for adenopathy. Does not bruise/bleed easily.  Psychiatric/Behavioral: Negative for hallucinations, behavioral problems, confusion, dysphoric mood, decreased concentration and agitation.    Objective:   Filed Vitals:   09/06/13 1533  BP: 144/98  Pulse: 92  Temp: 98.7 F (37.1 C)  Resp: 16    Physical Exam  Constitutional: Appears well-developed and well-nourished. No distress.  CVS: RRR, S1/S2 +, no murmurs, no gallops, no carotid bruit.  Pulmonary: Effort and breath sounds normal, no stridor, rhonchi, wheezes, rales.  Abdominal: Soft. BS +,  no distension, tenderness, rebound or guarding.  Musculoskeletal: Normal range of motion. Small amount of knee effusion present, left side, lateral aspect of the knee, significant tenderness to palpation. No warmth to touch and no erythema noted   Lymphadenopathy: No lymphadenopathy noted, cervical, inguinal.   Lab Results  Component Value Date   WBC 4.6 02/26/2013   HGB 11.2* 02/26/2013   HCT 34.2* 02/26/2013   MCV 89.3 02/26/2013   PLT 294 02/26/2013   Lab Results  Component Value Date   CREATININE 0.75 02/05/2013   BUN 20 02/05/2013   NA 139 02/05/2013   K 3.9 02/05/2013   CL 105 02/05/2013   CO2 21 02/05/2013    Lab Results  Component Value Date   HGBA1C 5.6% 10/03/2012   Lipid Panel     Component Value Date/Time   CHOL 194 10/26/2012 1052   TRIG 230* 10/26/2012 1052   HDL 82 10/26/2012 1052   CHOLHDL 2.4 10/26/2012 1052   VLDL 46* 10/26/2012 1052   LDLCALC 66 10/26/2012 1052       Assessment and plan:   Patient Active Problem List   Diagnosis Date Noted  . Uncontrolled hypertension - Blood pressure appears to be stable - continue current blood pressure medications  - pt needs BMP but wants to defer blood work till next visit  10/03/2012  . Hyperglycemia - pt needs repeat A1C - she wants to defer blood work until next visit  - we have discussed the diet recommendations  10/03/2012  . Left knee effusion - request XRAY for the left knee - Pt used Aleve as needed and can continue taking same medications  12/06/2007

## 2013-09-06 NOTE — Patient Instructions (Signed)

## 2013-09-07 ENCOUNTER — Telehealth: Payer: Self-pay | Admitting: Internal Medicine

## 2013-09-07 NOTE — Telephone Encounter (Signed)
Pt. Was seen by Dr. Izola Price on 09/06/13 and was prescribed Tramadol, patient came in requesting something stronger.Marland KitchenMarland KitchenI informed patient I would have to put in a note for the provider, pt. Stated to forget about it, tramadol wasn't going to work and walked away.Marland KitchenMarland KitchenMarland KitchenMarland Kitchenprescription was shredded

## 2013-10-04 ENCOUNTER — Ambulatory Visit: Payer: Self-pay

## 2013-10-04 VITALS — BP 146/94 | HR 100 | Temp 98.3°F | Resp 16

## 2013-10-04 MED ORDER — AZITHROMYCIN 250 MG PO TABS: ORAL_TABLET | ORAL | Status: DC

## 2013-10-04 NOTE — Patient Instructions (Signed)

## 2013-10-04 NOTE — Progress Notes (Unsigned)
Patient presents to walk in clinic with c/o left ear ache for a couple of days. Patient states she has tried sweet oil in her ear. Left ear is red inside on visual inspection. Patient also states she has been coughing green mucus. Consulted with Dr. Huntley Dec. Verbal order received for Zpack. Informed patient about plan of care. Patient verbalized understanding.  Annamaria Helling, RN

## 2013-10-05 ENCOUNTER — Other Ambulatory Visit: Payer: Self-pay | Admitting: Internal Medicine

## 2013-10-05 MED ORDER — FLUCONAZOLE 150 MG PO TABS: 150.0000 mg | ORAL_TABLET | Freq: Once | ORAL | Status: DC

## 2013-11-12 ENCOUNTER — Other Ambulatory Visit (HOSPITAL_COMMUNITY): Payer: Self-pay | Admitting: Adult Health

## 2013-12-11 ENCOUNTER — Ambulatory Visit: Payer: Self-pay | Attending: Family Medicine | Admitting: Family Medicine

## 2013-12-11 ENCOUNTER — Encounter: Payer: Self-pay | Admitting: Family Medicine

## 2013-12-11 VITALS — BP 121/81 | HR 87 | Temp 98.2°F | Resp 16 | Ht 62.0 in | Wt 165.0 lb

## 2013-12-11 DIAGNOSIS — Z87891 Personal history of nicotine dependence: Secondary | ICD-10-CM | POA: Insufficient documentation

## 2013-12-11 DIAGNOSIS — M179 Osteoarthritis of knee, unspecified: Secondary | ICD-10-CM | POA: Insufficient documentation

## 2013-12-11 DIAGNOSIS — M1712 Unilateral primary osteoarthritis, left knee: Secondary | ICD-10-CM

## 2013-12-11 DIAGNOSIS — I10 Essential (primary) hypertension: Secondary | ICD-10-CM | POA: Insufficient documentation

## 2013-12-11 DIAGNOSIS — Z23 Encounter for immunization: Secondary | ICD-10-CM

## 2013-12-11 NOTE — Patient Instructions (Signed)
Ms. Killins,  Thank you for coming in today. It was a pleasure meeting you. I look forward to being your primary doctor.  1. HTN:  Well controlled. Great job Continue current medication. Checking potassium today  2. Ears: normal.  3. Left knee pain: continue aleve 1-2 times daily with food. If worsening we can do injection. Excellent job with weight loss.   F/u in 1-2 months for physical with pap smear.   F/u in 6 months for hypertension   Dr. Armen Pickup

## 2013-12-11 NOTE — Assessment & Plan Note (Signed)
A: Left knee OA. P: Continue weight loss Continue prn NSAID Discussed option of injectable corticosteroid, patient refused at this time.  F/u as needed

## 2013-12-11 NOTE — Progress Notes (Signed)
F/U ear problems, stated finish antibiotic  Requested Xray results

## 2013-12-11 NOTE — Assessment & Plan Note (Addendum)
A: BP well controlled P: Continue current regimen F/u CMP patient likely no longer needs oral potassium supplement since not on loop diuretic. F/u in 6 months

## 2013-12-11 NOTE — Progress Notes (Signed)
   Subjective:    Patient ID: Christina Byrd, female    DOB: 1965-10-24, 48 y.o.   MRN: [id number] CC: f/u ear problem, review x-ray of Left knee, HTN f/u  HPI 48 yo F presents with her older sister to discuss the following:  1. Left ear pain and stuffiness: resolved following oral antibiotic therapy.   2. Left knee pain: b/l, with mild swelling, achy, non-radiating, worse with cold or wet weather. Pain controlled with once daily aleve.   3. HTN: compliant with medication therapy. No HA, vision changes, SOB, CP. Some palpitations associated with hot flashes.   Soc hx: former smoker, quit in 2014  Review of Systems As per HPI     Objective:   Physical Exam BP 121/81  Pulse 87  Temp(Src) 98.2 F (36.8 C) (Oral)  Resp 16  Ht 5\' 2"  (1.575 m)  Wt 165 lb (74.844 kg)  BMI 30.17 kg/m2  SpO2 99%  LMP 08/22/2013 Generalalert, cooperative and no distress Ears: normal TM's and external ear canals both ears Lungs: clear to auscultation bilaterally, no wheezing, rhonchi or crackles  Heart: regular rate and rhythm, S1, S2 normal, no murmur, click, rub or gallop Lt knee: mild edema laterally.  Reviewed left knee x-ray images with patient and her sister.      Assessment & Plan:

## 2013-12-12 LAB — COMPLETE METABOLIC PANEL WITH GFR
ALT: 10 U/L (ref 0–35)
AST: 17 U/L (ref 0–37)
Albumin: 4 g/dL (ref 3.5–5.2)
Alkaline Phosphatase: 62 U/L (ref 39–117)
BUN: 18 mg/dL (ref 6–23)
CALCIUM: 9.2 mg/dL (ref 8.4–10.5)
CHLORIDE: 97 meq/L (ref 96–112)
CO2: 26 meq/L (ref 19–32)
CREATININE: 0.76 mg/dL (ref 0.50–1.10)
GFR, Est African American: >89 mL/min
GLUCOSE: 98 mg/dL (ref 70–99)
Potassium: 3.7 mEq/L (ref 3.5–5.3)
Sodium: 132 mEq/L — ABNORMAL LOW (ref 135–145)
Total Bilirubin: 0.3 mg/dL (ref 0.2–1.2)
Total Protein: 7.2 g/dL (ref 6.0–8.3)

## 2013-12-17 ENCOUNTER — Telehealth: Payer: Self-pay | Admitting: *Deleted

## 2013-12-17 NOTE — Telephone Encounter (Signed)
Message copied by Dyann Kief on Mon Dec 17, 2013  3:02 PM ------      Message from: Dessa Phi      Created: Wed Dec 12, 2013  1:37 PM       Sodium a bit low on HCTZ, may need to change to a different BP med.       Will repeat BMP in 3 months      Continue HCTZ for now        ------

## 2013-12-17 NOTE — Telephone Encounter (Signed)
Pt aware of lab results and informed to return in 3 month.

## 2014-01-14 ENCOUNTER — Other Ambulatory Visit: Payer: Self-pay | Admitting: Cardiovascular Disease

## 2014-01-14 ENCOUNTER — Other Ambulatory Visit: Payer: Self-pay | Admitting: Internal Medicine

## 2014-01-14 DIAGNOSIS — I5022 Chronic systolic (congestive) heart failure: Secondary | ICD-10-CM

## 2014-03-19 ENCOUNTER — Ambulatory Visit: Payer: Self-pay | Admitting: Family Medicine

## 2014-03-20 ENCOUNTER — Other Ambulatory Visit: Payer: Self-pay | Admitting: Family Medicine

## 2014-03-20 DIAGNOSIS — I10 Essential (primary) hypertension: Secondary | ICD-10-CM

## 2014-03-21 ENCOUNTER — Ambulatory Visit: Payer: Self-pay | Attending: Family Medicine

## 2014-03-21 DIAGNOSIS — I10 Essential (primary) hypertension: Secondary | ICD-10-CM

## 2014-03-21 LAB — BASIC METABOLIC PANEL
BUN: 17 mg/dL (ref 6–23)
CO2: 25 meq/L (ref 19–32)
CREATININE: 1 mg/dL (ref 0.50–1.10)
Calcium: 8.9 mg/dL (ref 8.4–10.5)
Chloride: 98 mEq/L (ref 96–112)
Glucose, Bld: 133 mg/dL — ABNORMAL HIGH (ref 70–99)
Potassium: 3.9 mEq/L (ref 3.5–5.3)
SODIUM: 135 meq/L (ref 135–145)

## 2014-03-22 ENCOUNTER — Telehealth: Payer: Self-pay | Admitting: *Deleted

## 2014-03-22 NOTE — Telephone Encounter (Signed)
-----   Message from Lora Paula, MD sent at 03/22/2014  9:10 AM EST ----- Normal BMP

## 2014-03-22 NOTE — Telephone Encounter (Signed)
Pt aware of lab resutls 

## 2014-03-25 ENCOUNTER — Ambulatory Visit: Payer: Self-pay | Admitting: Family Medicine

## 2014-04-01 ENCOUNTER — Encounter: Payer: Self-pay | Admitting: Family Medicine

## 2014-04-01 ENCOUNTER — Ambulatory Visit: Payer: No Typology Code available for payment source | Attending: Family Medicine | Admitting: Family Medicine

## 2014-04-01 VITALS — BP 121/85 | HR 99 | Temp 98.5°F | Resp 16 | Ht 62.0 in | Wt 165.0 lb

## 2014-04-01 DIAGNOSIS — M7661 Achilles tendinitis, right leg: Secondary | ICD-10-CM | POA: Insufficient documentation

## 2014-04-01 DIAGNOSIS — Z87891 Personal history of nicotine dependence: Secondary | ICD-10-CM | POA: Insufficient documentation

## 2014-04-01 DIAGNOSIS — K219 Gastro-esophageal reflux disease without esophagitis: Secondary | ICD-10-CM | POA: Insufficient documentation

## 2014-04-01 HISTORY — DX: Achilles tendinitis, right leg: M76.61

## 2014-04-01 MED ORDER — PANTOPRAZOLE SODIUM 20 MG PO TBEC: 20.0000 mg | DELAYED_RELEASE_TABLET | Freq: Two times a day (BID) | ORAL | Status: DC

## 2014-04-01 MED ORDER — DICLOFENAC SODIUM 75 MG PO TBEC: 75.0000 mg | DELAYED_RELEASE_TABLET | Freq: Two times a day (BID) | ORAL | Status: DC

## 2014-04-01 NOTE — Progress Notes (Signed)
Patient has been experiencing right heel pain 7/10 for last several weeks Pain is worse when she walks she has noticed some swelling Patient also has left knee pain-patient refuses steroid shot

## 2014-04-01 NOTE — Progress Notes (Signed)
   Subjective:    Patient ID: Christina Byrd, female    DOB: 20-Apr-1965, 49 y.o.   MRN: [id number] CC: Rt heel pain  HPI 49 yo F f/u:  1. Rt heel pain: posterior and medial heel pain. No injury. Mild swelling. No treatment. Worse after prolonged activity. No fever.   2. GERD: has hx of GERD. No treatment currently.    Soc Hx: former smoker, quit in 06/2012  Review of Systems As per HPI      Objective:   Physical Exam BP 121/85 mmHg  Pulse 99  Temp(Src) 98.5 F (36.9 C)  Resp 16  Ht 5\' 2"  (1.575 m)  Wt 165 lb (74.844 kg)  BMI 30.17 kg/m2  SpO2 100%  LMP 04/01/2014 General appearance: alert, cooperative and no distress Extremities: no edema.  Rt heel: no deformity, full ROM, no edema. Pain with inversion against resistance and tenderness to palpation along the distal tendon.  Rt feet: flat, dry foot, mild TTP R heel.    Assessment & Plan:

## 2014-04-01 NOTE — Patient Instructions (Addendum)
Christina Byrd,  Thank you for coming in today.  1. Right heel and foot pain: I suspect achilles tendinitis Plan: Ice for 20 minutes twice daily, wrap ice in thin cloth or wrap a bag of frozen vegetables. Hold the ice to the area of pain.  Take diclofenac 75 mg twice with food for pain. Do the exercises below twice daily   2. GERD: refilled protonix. Take twice daily 30 minutes before supper.   Please apply for [facility] discount and orange card, you can also inquire if any of your medications are on the PASS (medications assistance) list.    F/u in 6 weeks for R achilles tendinitis   Dr. Armen Pickup   Achilles Tendinitis  with Rehab Achilles tendinitis is a disorder of the Achilles tendon. The Achilles tendon connects the large calf muscles (Gastrocnemius and Soleus) to the heel bone (calcaneus). This tendon is sometimes called the heel cord. It is important for pushing-off and standing on your toes and is important for walking, running, or jumping. Tendinitis is often caused by overuse and repetitive microtrauma. SYMPTOMS  Pain, tenderness, swelling, warmth, and redness may occur over the Achilles tendon even at rest.  Pain with pushing off, or flexing or extending the ankle.  Pain that is worsened after or during activity. CAUSES   Overuse sometimes seen with rapid increase in exercise programs or in sports requiring running and jumping.  Poor physical conditioning (strength and flexibility or endurance).  Running sports, especially training running down hills.  Inadequate warm-up before practice or play or failure to stretch before participation.  Injury to the tendon. PREVENTION   Warm up and stretch before practice or competition.  Allow time for adequate rest and recovery between practices and competition.  Keep up conditioning.  Keep up ankle and leg flexibility.  Improve or keep muscle strength and endurance.  Improve cardiovascular fitness.  Use proper  technique.  Use proper equipment (shoes, skates).  To help prevent recurrence, taping, protective strapping, or an adhesive bandage may be recommended for several weeks after healing is complete. PROGNOSIS   Recovery may take weeks to several months to heal.  Longer recovery is expected if symptoms have been prolonged.  Recovery is usually quicker if the inflammation is due to a direct blow as compared with overuse or sudden strain. RELATED COMPLICATIONS   Healing time will be prolonged if the condition is not correctly treated. The injury must be given plenty of time to heal.  Symptoms can reoccur if activity is resumed too soon.  Untreated, tendinitis may increase the risk of tendon rupture requiring additional time for recovery and possibly surgery. TREATMENT   The first treatment consists of rest anti-inflammatory medication, and ice to relieve the pain.  Stretching and strengthening exercises after resolution of pain will likely help reduce the risk of recurrence. Referral to a physical therapist or athletic trainer for further evaluation and treatment may be helpful.  A walking boot or cast may be recommended to rest the Achilles tendon. This can help break the cycle of inflammation and microtrauma.  Arch supports (orthotics) may be prescribed or recommended by your caregiver as an adjunct to therapy and rest.  Surgery to remove the inflamed tendon lining or degenerated tendon tissue is rarely necessary and has shown less than predictable results. MEDICATION   Nonsteroidal anti-inflammatory medications, such as aspirin and ibuprofen, may be used for pain and inflammation relief. Do not take within 7 days before surgery. Take these as directed by your  caregiver. Contact your caregiver immediately if any bleeding, stomach upset, or signs of allergic reaction occur. Other minor pain relievers, such as acetaminophen, may also be used.  Pain relievers may be prescribed as necessary  by your caregiver. Do not take prescription pain medication for longer than 4 to 7 days. Use only as directed and only as much as you need.  Cortisone injections are rarely indicated. Cortisone injections may weaken tendons and predispose to rupture. It is better to give the condition more time to heal than to use them. HEAT AND COLD  Cold is used to relieve pain and reduce inflammation for acute and chronic Achilles tendinitis. Cold should be applied for 10 to 15 minutes every 2 to 3 hours for inflammation and pain and immediately after any activity that aggravates your symptoms. Use ice packs or an ice massage.  Heat may be used before performing stretching and strengthening activities prescribed by your caregiver. Use a heat pack or a warm soak. SEEK MEDICAL CARE IF:  Symptoms get worse or do not improve in 2 weeks despite treatment.  New, unexplained symptoms develop. Drugs used in treatment may produce side effects. EXERCISES RANGE OF MOTION (ROM) AND STRETCHING EXERCISES - Achilles Tendinitis  These exercises may help you when beginning to rehabilitate your injury. Your symptoms may resolve with or without further involvement from your physician, physical therapist or athletic trainer. While completing these exercises, remember:   Restoring tissue flexibility helps normal motion to return to the joints. This allows healthier, less painful movement and activity.  An effective stretch should be held for at least 30 seconds.  A stretch should never be painful. You should only feel a gentle lengthening or release in the stretched tissue. STRETCH - Gastroc, Standing   Place hands on wall.  Extend right / left leg, keeping the front knee somewhat bent.  Slightly point your toes inward on your back foot.  Keeping your right / left heel on the floor and your knee straight, shift your weight toward the wall, not allowing your back to arch.  You should feel a gentle stretch in the right /  left calf. Hold this position for __________ seconds. Repeat __________ times. Complete this stretch __________ times per day. STRETCH - Soleus, Standing   Place hands on wall.  Extend right / left leg, keeping the other knee somewhat bent.  Slightly point your toes inward on your back foot.  Keep your right / left heel on the floor, bend your back knee, and slightly shift your weight over the back leg so that you feel a gentle stretch deep in your back calf.  Hold this position for __________ seconds. Repeat __________ times. Complete this stretch __________ times per day. STRETCH - Gastrocsoleus, Standing  Note: This exercise can place a lot of stress on your foot and ankle. Please complete this exercise only if specifically instructed by your caregiver.   Place the ball of your right / left foot on a step, keeping your other foot firmly on the same step.  Hold on to the wall or a rail for balance.  Slowly lift your other foot, allowing your body weight to press your heel down over the edge of the step.  You should feel a stretch in your right / left calf.  Hold this position for __________ seconds.  Repeat this exercise with a slight bend in your knee. Repeat __________ times. Complete this stretch __________ times per day.  STRENGTHENING EXERCISES - Achilles Tendinitis  These exercises may help you when beginning to rehabilitate your injury. They may resolve your symptoms with or without further involvement from your physician, physical therapist or athletic trainer. While completing these exercises, remember:   Muscles can gain both the endurance and the strength needed for everyday activities through controlled exercises.  Complete these exercises as instructed by your physician, physical therapist or athletic trainer. Progress the resistance and repetitions only as guided.  You may experience muscle soreness or fatigue, but the pain or discomfort you are trying to eliminate  should never worsen during these exercises. If this pain does worsen, stop and make certain you are following the directions exactly. If the pain is still present after adjustments, discontinue the exercise until you can discuss the trouble with your clinician. STRENGTH - Plantar-flexors   Sit with your right / left leg extended. Holding onto both ends of a rubber exercise band/tubing, loop it around the ball of your foot. Keep a slight tension in the band.  Slowly push your toes away from you, pointing them downward.  Hold this position for __________ seconds. Return slowly, controlling the tension in the band/tubing. Repeat __________ times. Complete this exercise __________ times per day.  STRENGTH - Plantar-flexors   Stand with your feet shoulder width apart. Steady yourself with a wall or table using as little support as needed.  Keeping your weight evenly spread over the width of your feet, rise up on your toes.*  Hold this position for __________ seconds. Repeat __________ times. Complete this exercise __________ times per day.  *If this is too easy, shift your weight toward your right / left leg until you feel challenged. Ultimately, you may be asked to do this exercise with your right / left foot only. STRENGTH - Plantar-flexors, Eccentric  Note: This exercise can place a lot of stress on your foot and ankle. Please complete this exercise only if specifically instructed by your caregiver.   Place the balls of your feet on a step. With your hands, use only enough support from a wall or rail to keep your balance.  Keep your knees straight and rise up on your toes.  Slowly shift your weight entirely to your right / left toes and pick up your opposite foot. Gently and with controlled movement, lower your weight through your right / left foot so that your heel drops below the level of the step. You will feel a slight stretch in the back of your calf at the end position.  Use the  healthy leg to help rise up onto the balls of both feet, then lower weight only on the right / left leg again. Build up to 15 repetitions. Then progress to 3 consecutive sets of 15 repetitions.*  After completing the above exercise, complete the same exercise with a slight knee bend (about 30 degrees). Again, build up to 15 repetitions. Then progress to 3 consecutive sets of 15 repetitions.* Perform this exercise __________ times per day.  *When you easily complete 3 sets of 15, your physician, physical therapist or athletic trainer may advise you to add resistance by wearing a backpack filled with additional weight. STRENGTH - Plantar Flexors, Seated   Sit on a chair that allows your feet to rest flat on the ground. If necessary, sit at the edge of the chair.  Keeping your toes firmly on the ground, lift your right / left heel as far as you can without increasing any discomfort in your ankle. Repeat __________ times. Complete  this exercise __________ times a day. *If instructed by your physician, physical therapist or athletic trainer, you may add ____________________ of resistance by placing a weighted object on your right / left knee. Document Released: 09/09/2004 Document Revised: 05/03/2011 Document Reviewed: 05/23/2008 [facility] Patient Information 2015 [place], [place]. This information is not intended to replace advice given to you by your health care provider. Make sure you discuss any questions you have with your health care provider.

## 2014-04-02 NOTE — Assessment & Plan Note (Signed)
1. Right heel and foot pain: I suspect achilles tendinitis Plan: Ice for 20 minutes twice daily, wrap ice in thin cloth or wrap a bag of frozen vegetables. Hold the ice to the area of pain.  Take diclofenac 75 mg twice with food for pain. Do the exercises below twice daily

## 2014-04-02 NOTE — Assessment & Plan Note (Signed)
2. GERD: refilled protonix. Take twice daily 30 minutes before supper.

## 2014-04-18 ENCOUNTER — Encounter: Payer: Self-pay | Admitting: Cardiovascular Disease

## 2014-04-18 ENCOUNTER — Ambulatory Visit (INDEPENDENT_AMBULATORY_CARE_PROVIDER_SITE_OTHER): Payer: No Typology Code available for payment source | Admitting: Cardiovascular Disease

## 2014-04-18 VITALS — BP 122/60 | HR 91 | Ht 62.0 in | Wt 163.0 lb

## 2014-04-18 DIAGNOSIS — I5020 Unspecified systolic (congestive) heart failure: Secondary | ICD-10-CM

## 2014-04-18 NOTE — Patient Instructions (Signed)
Your physician recommends that you schedule a follow-up appointment in:  3 MONTHS WITH  DR NISHAN  Your physician recommends that you continue on your current medications as directed. Please refer to the Current Medication list given to you today.  

## 2014-04-18 NOTE — Progress Notes (Signed)
Primary PCP: [facility]  HPI: Christina Byrd is a 49 y.o.  patient with  a history of HTN, tobacco abuse, GERD and chronic systolic HF.   She was admitted to the hospital in 08/2012 with increased SOB and was thought to have PNA. She was treated with antibiotics and was sent home. She continued to have SOB and ECHO 09/2012 showed EF 20-25% with moderate MR.  Was seen by me on 01/26/13 and was given samples of benicar/HCTZ 40/12.5 mg and was scheduled for [facility] in January 2015. Reports can't afford medications and one medication was $24 and she thought was coreg so has only been taking once a day. Was on Cozaar however stopped d/t facial pruritis. Developed cough with ACE-I.   She returns for follow up. Last visit she was started on hydralazine 50 mg TID/IMDUR 30 mg daily, and also carvedilol was increased to 6.25 mg bid. She stopped taking hydralazine and Imdur due to headaches. Complains of increased cough and fatigue.  Productive cough with green sputum. This being followed by her PCP. She has not been taking hydralazine/Imdur due to headache. Not weighing at home. Taking all meds.   ECHO 03/15/13 EF 45-50%   SH: Quit smoking 8 months ago. Drinks a beer a night and occasional shots of liquor. 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   ROS: All systems negative except as listed in HPI, PMH and Problem List.  Past Medical History  Diagnosis Date  . GERD (gastroesophageal reflux disease) Dx 1995  . GASTROESOPHAGEAL REFLUX, NO ESOPHAGITIS 04/21/2006    Qualifier: Diagnosis of  By: Levada Schilling      Current Outpatient Prescriptions  Medication Sig Dispense Refill  . amLODipine (NORVASC) 10 MG tablet Take 1 tablet (10 mg total) by mouth daily. 30 tablet 7  . aspirin EC 81 MG tablet Take 1 tablet (81 mg total) by mouth daily.    . carvedilol (COREG) 6.25 MG tablet TAKE 1 TABLET BY MOUTH TWICE DAILY. 60 tablet 7  . diclofenac (VOLTAREN) 75 MG EC tablet Take 1 tablet (75 mg total) by  mouth 2 (two) times daily with a meal. 60 tablet 0  . hydrochlorothiazide (HYDRODIURIL) 25 MG tablet Take 1 tablet (25 mg total) by mouth daily. 30 tablet 7  . pantoprazole (PROTONIX) 20 MG tablet Take 1 tablet (20 mg total) by mouth 2 (two) times daily before a meal. 180 tablet 2  . potassium chloride (K-DUR) 10 MEQ tablet TAKE ONE TABLET BY MOUTH DAILY 90 tablet 2   No current facility-administered medications for this visit.    Filed Vitals:   04/18/14 1443  BP: 122/60  Pulse: 91  Height:  (1.575 m)  Weight: 73.936 kg (163 lb)    PHYSICAL EXAM: General:  Well appearing. No resp difficulty HEENT: normal Neck: supple. JVP 5-6  Carotids 2+ bilaterally; no bruits. No lymphadenopathy or thryomegaly appreciated. Cor: PMI normal. Regular rate & rhythm. No rubs, gallops or murmurs. Lungs: clear Abdomen: soft, nontender, nondistended. No hepatosplenomegaly. No bruits or masses. Good bowel sounds. Extremities: no cyanosis, clubbing, rash, edema Neuro: alert & orientedx3, cranial nerves grossly intact. Moves all 4 extremities w/o difficulty. Affect pleasant.  ECG:  11/30/12  SR rate 74 LVH  No  Old MI   2.25/16  SR rate 91 normal   ASSESSMENT & PLAN:  1) Chronic systolic HF: EF 20-25% (09/2012) Etiology of HF unclear, possibly related to long standing HTN. Dr Gala Romney reviewed and discussed ECHO. EF improved to 45-50%   -  NYHA II symptoms. Volume status is stable. Will continue HCTX 25 mg daily.  Continue 6.25 mg carvedilol twice a day.  May increase next visit  Off hydralazine/Imdur due to headache.Start amlodipine 10 mg daily. No on an ace due to cough. No on losartan due to rash.  Reinforced low salt food choices, limiting fluid intake to < 2 liters per day, and medication compliance.   2) HTN- elevated. Hydralazine/Imdur stopped due to headache. Continue current dose of carvedilol and HCTZ. Improved with amlodipine    Charlton Haws NP-C 2:59 PM

## 2014-05-13 ENCOUNTER — Ambulatory Visit: Payer: No Typology Code available for payment source | Attending: Family Medicine | Admitting: Family Medicine

## 2014-05-13 ENCOUNTER — Encounter: Payer: Self-pay | Admitting: Family Medicine

## 2014-05-13 VITALS — BP 132/89 | HR 91 | Temp 98.3°F | Resp 18 | Ht 62.0 in | Wt 164.0 lb

## 2014-05-13 DIAGNOSIS — R0982 Postnasal drip: Secondary | ICD-10-CM | POA: Insufficient documentation

## 2014-05-13 DIAGNOSIS — M7661 Achilles tendinitis, right leg: Secondary | ICD-10-CM

## 2014-05-13 DIAGNOSIS — J309 Allergic rhinitis, unspecified: Secondary | ICD-10-CM

## 2014-05-13 DIAGNOSIS — Z87891 Personal history of nicotine dependence: Secondary | ICD-10-CM | POA: Insufficient documentation

## 2014-05-13 MED ORDER — LORATADINE 10 MG PO TABS: 10.0000 mg | ORAL_TABLET | Freq: Every day | ORAL | Status: DC

## 2014-05-13 MED ORDER — DICLOFENAC SODIUM 75 MG PO TBEC: 75.0000 mg | DELAYED_RELEASE_TABLET | Freq: Two times a day (BID) | ORAL | Status: DC | PRN

## 2014-05-13 MED ORDER — FLUTICASONE PROPIONATE 50 MCG/ACT NA SUSP: 2.0000 | Freq: Every day | NASAL | Status: DC

## 2014-05-13 NOTE — Assessment & Plan Note (Signed)
Allergic rhinitis: claritin 10 mg daily flonase nightly

## 2014-05-13 NOTE — Patient Instructions (Addendum)
Christina Byrd,  Thank you for coming in today.  1. R Achilles tendon pain:  Continue diclofenac up to two times daily as needed, use sparingly. Elevate and ice your L heel when resting (place ice in thin cloth) I have referred you to sports medicine for ultrasound and possible nitroglycerin patch treatment  2. Allergic rhinitis: claritin 10 mg daily flonase nightly  F/u in 2 months for allergies and achilles tendonitis   Dr. Armen Pickup

## 2014-05-13 NOTE — Assessment & Plan Note (Signed)
R Achilles tendon pain:  Continue diclofenac up to two times daily as needed, use sparingly. Elevate and ice your L heel when resting (place ice in thin cloth) I have referred you to sports medicine for ultrasound and possible nitroglycerin patch treatment

## 2014-05-13 NOTE — Progress Notes (Signed)
   Subjective:    Patient ID: Christina Byrd, female    DOB: 1965/03/03, 49 y.o.   MRN: [id number] CC: f/u R achilles tendonitis, post nasal drip  HPI  1. R achilles tendonitis: slight improvement. Pain is much better with diclofenac. Patient has not been elevating or icing the area. No redness. Mild posterior malleolus swelling.   2. Post nasal drip: x 2 weeks. With nasal congestion and pressure. Small amount of bleeding from R nares. No fever, cough, chills, SOB.   Soc Hx: former smoker, quit  Review of Systems     Objective:   Physical Exam BP 132/89 mmHg  Pulse 91  Temp(Src) 98.3 F (36.8 C)  Resp 18  Ht 5\' 2"  (1.575 m)  Wt 164 lb (74.39 kg)  BMI 29.99 kg/m2  SpO2 100%  LMP 05/08/2014 General appearance: alert, cooperative and no distress Nose: no discharge, turbinates pink, swollen Throat: lips, mucosa, and tongue normal; teeth and gums normal Extremities: mild swelling behind medial malleolus and tenderness along R achilles tendon distal insertion      Assessment & Plan:

## 2014-05-13 NOTE — Progress Notes (Signed)
F/U  Heal injury  Stated heal still sore and swelling

## 2014-06-04 ENCOUNTER — Encounter: Payer: Self-pay | Admitting: Sports Medicine

## 2014-06-04 ENCOUNTER — Ambulatory Visit (INDEPENDENT_AMBULATORY_CARE_PROVIDER_SITE_OTHER): Payer: No Typology Code available for payment source | Admitting: Sports Medicine

## 2014-06-04 VITALS — BP 120/70 | Ht 62.0 in | Wt 163.0 lb

## 2014-06-04 DIAGNOSIS — M7661 Achilles tendinitis, right leg: Secondary | ICD-10-CM

## 2014-06-04 MED ORDER — NITROGLYCERIN 0.2 MG/HR TD PT24: MEDICATED_PATCH | TRANSDERMAL | Status: DC

## 2014-06-04 NOTE — Patient Instructions (Addendum)
You have a 30% partial tear of your right achilles tendon. -Added heel lifts to your shoes. Keep in for 4 weeks, then can remove. -Start nitroglycerin patch once a day for the next 6 weeks. This promotes tendon healing. -Ice is also beneficial if it is sore. -Start the rehab exercises. 10 heel lifts, 3 times a day, at least 5-7 days a week to strengthen the tendon. -We will see you back in 6 weeks to re-scan the achilles to see how it is healing. Call if you have any questions. It was good to see you!  Nitroglycerin Protocol   Apply 1/4 nitroglycerin patch to affected area daily.  Change position of patch within the affected area every 24 hours.  You may experience a headache during the first 1-2 weeks of using the patch, these should subside.  If you experience headaches after beginning nitroglycerin patch treatment, you may take your preferred over the counter pain reliever.  Another side effect of the nitroglycerin patch is skin irritation or rash related to patch adhesive.  Please notify our office if you develop more severe headaches or rash, and stop the patch.  Tendon healing with nitroglycerin patch may require 12 to 24 weeks depending on the extent of injury.  Do not use if you have migraines or rosacea.

## 2014-06-04 NOTE — Assessment & Plan Note (Signed)
30% partial thickness intrasubstance tear seen on ultrasound at 1-3 cm proximal to the distal insertion.  -Start nitroglycerin protocol -Bilateral heel lifts and small scaphoid pad added to insoles -Start Achilles tendon rehabilitation exercises -Ice and diclofenac if needed -Plan for follow-up in 6 weeks for re-ultrasound or sooner if needed.

## 2014-06-04 NOTE — Progress Notes (Signed)
   Subjective:    Patient ID: Christina Byrd, female    DOB: August 15, 1965, 49 y.o.   MRN: [id number]  HPI Christina Byrd is a 49 year old female new patient who presents with right Achilles tendon pain. Onset of symptoms was about 3-4 months ago without any known acute injury. Location is in the posterior midportion of the right Achilles tendon. She denies any bruising but notices some swelling. She tried oral diclofenac and Achilles tendon exercises with little relief. Denies any recent fluoroquinolone use. She is on her feet much of the day. She denies any fevers or chills.  Active Ambulatory Problems    Diagnosis Date Noted  . OBESITY 08/12/2006  . Ex-smoker 11/09/2008  . HYPERTENSION, BENIGN ESSENTIAL 08/12/2006  . 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 Chronic systolic heart failure 02/05/2013  . Osteoarthritis of left knee 12/11/2013  . GERD (gastroesophageal reflux disease) 04/01/2014  . Tendonitis, Achilles, right 04/01/2014  . Allergic rhinitis 05/13/2014   Resolved Ambulatory Problems    Diagnosis Date Noted  . GASTROESOPHAGEAL REFLUX, NO ESOPHAGITIS 04/21/2006  . UTI 08/05/2006  . ACNE, MILD 12/06/2007  . FLANK PAIN, RIGHT 08/05/2006  . HCAP (healthcare-associated pneumonia) 09/02/2012  . SOB (shortness of breath) 09/02/2012  . Hyponatremia 09/03/2012  . Uncontrolled hypertension 10/03/2012  . Hyperglycemia 10/03/2012  . Dilated cardiomyopathy, EF 20-25% 10/26/2012   No Additional Past Medical History   Past medical history, social history, medications, and allergies were reviewed and are up to date in the chart. Review of Systems 7 point review of systems was performed and was otherwise negative unless noted in the history of present illness.     Objective:   Physical Exam BP 120/70 mmHg  Ht 5\' 2"  (1.575 m)  Wt 163 lb (73.936 kg)  BMI 29.81 kg/m2  LMP 05/08/2014 GEN: The patient is well-developed well-nourished female and in no acute distress.  She is awake alert and  oriented x3. SKIN: warm and well-perfused, no rash  EXTR: No lower extremity edema or calf tenderness Neuro: Strength 5/5 globally. Sensation intact throughout. DTRs 2/4 bilaterally. No focal deficits. Vasc: +2 bilateral distal pulses. No edema.  MSK: Examination of the right foot reveals localized tenderness to palpation over any area 1-3 cm proximal to the Achilles tendon insertion. No palpable defect. Mild swelling located anteromedially to the Achilles tendon. No bruising. Full ankle range of motion without pain. Strength and sensation are intact throughout. Examination of the bilateral feet reveals bilateral pes planus abnormality with hyperpronation of the bilateral ankles. She walks with a slightly antalgic gait due to ankle pain.  Limited musculoskeletal ultrasound: Long and short axis views were obtained of the Achilles tendon which are significant for a 30% partial thickness tear of the medial portion of the right Achilles tendon. There is hypoechoic fluid in the intrasubstance of the tendon at an area 1-3 cm proximal to the distal insertion. No retrocalcaneal bursitis. The total thickness of the right Achilles tendon measures approximately 0.7 cm on the right compared to 0.5 cm on the left.     Assessment & Plan:  Please see problem based assessment and plan in the problem list.

## 2014-06-07 ENCOUNTER — Ambulatory Visit: Payer: No Typology Code available for payment source

## 2014-06-21 ENCOUNTER — Telehealth: Payer: Self-pay | Admitting: General Practice

## 2014-06-21 ENCOUNTER — Other Ambulatory Visit (HOSPITAL_COMMUNITY): Payer: Self-pay | Admitting: Adult Health

## 2014-06-21 ENCOUNTER — Other Ambulatory Visit: Payer: Self-pay | Admitting: Internal Medicine

## 2014-06-21 ENCOUNTER — Other Ambulatory Visit: Payer: Self-pay | Admitting: *Deleted

## 2014-06-21 MED ORDER — HYDROCHLOROTHIAZIDE 25 MG PO TABS: 25.0000 mg | ORAL_TABLET | Freq: Every day | ORAL | Status: DC

## 2014-06-21 MED ORDER — AMLODIPINE BESYLATE 10 MG PO TABS: 10.0000 mg | ORAL_TABLET | Freq: Every day | ORAL | Status: DC

## 2014-06-21 NOTE — Telephone Encounter (Signed)
Patient calling to request medication refill for the following medications: potassium chloride (K-DUR) 10 MEQ tablet,   amLODipine (NORVASC) 10 MG tablet,  hydrochlorothiazide (HYDRODIURIL) 25 MG tablet  Patient states she only has 1 pill left, yet when calling in for a refill, she was informed that she couldn't pick up until Monday. Patient is trying to avoid being with out her meds for 2 days. Please assist.

## 2014-06-21 NOTE — Telephone Encounter (Signed)
Refills send to [facility]  No K-Dur refills, potassium level within normal limit  Maintain a good  potassium diet Per DR Armen Pickup

## 2014-06-24 ENCOUNTER — Other Ambulatory Visit: Payer: Self-pay | Admitting: Family Medicine

## 2014-06-24 DIAGNOSIS — I10 Essential (primary) hypertension: Secondary | ICD-10-CM

## 2014-06-24 MED ORDER — HYDROCHLOROTHIAZIDE 25 MG PO TABS: 25.0000 mg | ORAL_TABLET | Freq: Every day | ORAL | Status: DC

## 2014-06-24 MED ORDER — AMLODIPINE BESYLATE 10 MG PO TABS: 10.0000 mg | ORAL_TABLET | Freq: Every day | ORAL | Status: DC

## 2014-06-24 MED ORDER — CARVEDILOL 6.25 MG PO TABS: ORAL_TABLET | ORAL | Status: DC

## 2014-07-01 ENCOUNTER — Ambulatory Visit: Payer: No Typology Code available for payment source | Attending: Family Medicine

## 2014-07-01 NOTE — Telephone Encounter (Signed)
Pt in our clinic to day advised to stop taking Potassium Mantain a good potassium diet

## 2014-07-06 NOTE — Progress Notes (Signed)
Patient ID: Christina Byrd, female   DOB: December 17, 1965, 49 y.o.   MRN: [id number]  Primary PCP: [facility]  HPI: Christina Byrd is a 49 y.o.  patient with  a history of HTN, tobacco abuse, GERD and chronic systolic HF.   She was admitted to the hospital in 08/2012 with increased SOB and was thought to have PNA. She was treated with antibiotics and was sent home. She continued to have SOB and ECHO 09/2012 showed EF 20-25% with moderate MR.  Was seen by me on 01/26/13 and was given samples of benicar/HCTZ 40/12.5 mg and was scheduled for [facility] in January 2015. Reports can't afford medications and one medication was $24 and she thought was coreg so has only been taking once a day. Was on Cozaar however stopped d/t facial pruritis. Developed cough with ACE-I.   She returns for follow up. Last visit she was started on hydralazine 50 mg TID/IMDUR 30 mg daily, and also carvedilol was increased to 6.25 mg bid. She stopped taking hydralazine and Imdur due to headaches. Complains of increased cough and fatigue.  Productive cough with green sputum. This being followed by her PCP. She has not been taking hydralazine/Imdur due to headache. Not weighing at home. Taking all meds.   ECHO 03/15/13 EF 45-50%   SH: Quit smoking 8 months ago. Drinks a beer a night and occasional shots of liquor. 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   ROS: All systems negative except as listed in HPI, PMH and Problem List.  Past Medical History  Diagnosis Date  . GERD (gastroesophageal reflux disease) Dx 1995  . GASTROESOPHAGEAL REFLUX, NO ESOPHAGITIS 04/21/2006    Qualifier: Diagnosis of  By: Christina Byrd      Current Outpatient Prescriptions  Medication Sig Dispense Refill  . amLODipine (NORVASC) 10 MG tablet Take 1 tablet (10 mg total) by mouth daily. 30 tablet 11  . aspirin EC 81 MG tablet Take 1 tablet (81 mg total) by mouth daily.    . carvedilol (COREG) 6.25 MG tablet TAKE 1 TABLET BY MOUTH TWICE DAILY. 60  tablet 11  . diclofenac (VOLTAREN) 75 MG EC tablet Take 1 tablet (75 mg total) by mouth 2 (two) times daily as needed for moderate pain. 60 tablet 1  . fluticasone (FLONASE) 50 MCG/ACT nasal spray Place 2 sprays into both nostrils at bedtime. 16 g 2  . hydrochlorothiazide (HYDRODIURIL) 25 MG tablet Take 1 tablet (25 mg total) by mouth daily. 30 tablet 11  . loratadine (CLARITIN) 10 MG tablet Take 1 tablet (10 mg total) by mouth daily. 30 tablet 2  . nitroGLYCERIN (NITRODUR - DOSED IN MG/24 HR) 0.2 mg/hr patch Apply 1/4 patch to affected area once daily. 30 patch 1  . pantoprazole (PROTONIX) 20 MG tablet Take 1 tablet (20 mg total) by mouth 2 (two) times daily before a meal. 180 tablet 2  . potassium chloride (K-DUR) 10 MEQ tablet TAKE ONE TABLET BY MOUTH DAILY 90 tablet 2   No current facility-administered medications for this visit.    There were no vitals filed for this visit.  PHYSICAL EXAM: General:  Well appearing. No resp difficulty HEENT: normal Neck: supple. JVP 5-6  Carotids 2+ bilaterally; no bruits. No lymphadenopathy or thryomegaly appreciated. Cor: PMI normal. Regular rate & rhythm. No rubs, gallops or murmurs. Lungs: clear Abdomen: soft, nontender, nondistended. No hepatosplenomegaly. No bruits or masses. Good bowel sounds. Extremities: no cyanosis, clubbing, rash, edema Neuro: alert & orientedx3, cranial nerves grossly intact. Moves  all 4 extremities w/o difficulty. Affect pleasant.  ECG:  11/30/12  SR rate 74 LVH  No  Old MI   2.25/16  SR rate 91 normal   ASSESSMENT & PLAN:  1) Chronic systolic HF: EF 20-25% (09/2012) Etiology of HF unclear, possibly related to long standing HTN. ECHO. EF improved to 45-50%   - NYHA II symptoms. Volume status is stable. Will continue HCTX 25 mg daily.  Continue 6.25 mg carvedilol twice a day.  May increase next visit  Off hydralazine/Imdur due to headache. Improved with amlodipine 10 mg daily. No on an ace due to cough. No on losartan  due to rash.  Reinforced low salt food choices, limiting fluid intake to < 2 liters per day, and medication compliance.   2) HTN- elevated. Hydralazine/Imdur stopped due to headache. Continue current dose of carvedilol and HCTZ. Improved with amlodipine    Charlton Haws  9:31 PM

## 2014-07-08 ENCOUNTER — Encounter: Payer: No Typology Code available for payment source | Admitting: Cardiovascular Disease

## 2014-07-11 ENCOUNTER — Encounter: Payer: Self-pay | Admitting: Cardiovascular Disease

## 2014-07-15 ENCOUNTER — Ambulatory Visit: Payer: No Typology Code available for payment source | Attending: Family Medicine | Admitting: Family Medicine

## 2014-07-15 ENCOUNTER — Encounter: Payer: Self-pay | Admitting: Family Medicine

## 2014-07-15 VITALS — BP 119/83 | HR 81 | Temp 98.8°F | Resp 16 | Ht 62.0 in | Wt 169.0 lb

## 2014-07-15 DIAGNOSIS — N951 Menopausal and female climacteric states: Secondary | ICD-10-CM

## 2014-07-15 DIAGNOSIS — M1712 Unilateral primary osteoarthritis, left knee: Secondary | ICD-10-CM

## 2014-07-15 MED ORDER — MELOXICAM 15 MG PO TABS: 15.0000 mg | ORAL_TABLET | Freq: Every day | ORAL | Status: DC

## 2014-07-15 NOTE — Progress Notes (Signed)
   Subjective:    Patient ID: Christina Byrd, female    DOB: 28-Mar-1965, 49 y.o.   MRN: [id number] CC: L knee pain, hot flashes  HPI 49 yo F   1. L knee pain: chronic, worsening, has known degeneration for X-ray done in 08/2013. worsening of past 2-3 months. Swelling. No redness. No recent injury. diclofenac helped but she was advised to stop taking it. To avoid adverse effects.   2. Hot flashes: most day of week. Day and night. Triggered by spicy foods. Associated with skipped or very short and light periods. Patient is a former smoker, quit.   Soc Hx: former smoker, quit  Review of Systems  Constitutional: Negative for fever and chills.  Musculoskeletal: Positive for joint swelling and arthralgias.       Objective:   Physical Exam BP 119/83 mmHg  Pulse 81  Temp(Src) 98.8 F (37.1 C) (Oral)  Resp 16  Ht 5\' 2"  (1.575 m)  Wt 169 lb (76.658 kg)  BMI 30.90 kg/m2  SpO2 100%  LMP 06/07/2014 General appearance: alert, cooperative and no distress Extremities: extremities normal, atraumatic, no cyanosis or edema  L knee: lateral effusion, no erythema, TTP around joint       Assessment & Plan:

## 2014-07-15 NOTE — Assessment & Plan Note (Signed)
A: significant swelling and tenderness concerning for intra articular injury P: MRI of knee ordered  mobic ordered

## 2014-07-15 NOTE — Patient Instructions (Signed)
Christina Byrd,  Thank you for coming in today  1. Hot flashes and sparse period: perimenopause see info below  2. L knee pain and swelling: MRI ordered mobic once daily for 1 week then as needed  F/u in 4-6 weeks for pap smear  Dr. Armen Pickup  Perimenopause Perimenopause is the time when your body begins to move into the menopause (no menstrual period for 12 straight months). It is a natural process. Perimenopause can begin 2-8 years before the menopause and usually lasts for 1 year after the menopause. During this time, your ovaries may or may not produce an egg. The ovaries vary in their production of estrogen and progesterone hormones each month. This can cause irregular menstrual periods, difficulty getting pregnant, vaginal bleeding between periods, and uncomfortable symptoms. CAUSES  Irregular production of the ovarian hormones, estrogen and progesterone, and not ovulating every month.  Other causes include:  Tumor of the pituitary gland in the brain.  Medical disease that affects the ovaries.  Radiation treatment.  Chemotherapy.  Unknown causes.  Heavy smoking and excessive alcohol intake can bring on perimenopause sooner. SIGNS AND SYMPTOMS   Hot flashes.  Night sweats.  Irregular menstrual periods.  Decreased sex drive.  Vaginal dryness.  Headaches.  Mood swings.  Depression.  Memory problems.  Irritability.  Tiredness.  Weight gain.  Trouble getting pregnant.  The beginning of losing bone cells (osteoporosis).  The beginning of hardening of the arteries (atherosclerosis). DIAGNOSIS  Your health care provider will make a diagnosis by analyzing your age, menstrual history, and symptoms. He or she will do a physical exam and note any changes in your body, especially your female organs. Female hormone tests may or may not be helpful depending on the amount of female hormones you produce and when you produce them. However, other hormone tests may be  helpful to rule out other problems. TREATMENT  In some cases, no treatment is needed. The decision on whether treatment is necessary during the perimenopause should be made by you and your health care provider based on how the symptoms are affecting you and your lifestyle. Various treatments are available, such as:  Treating individual symptoms with a specific medicine for that symptom.  Herbal medicines that can help specific symptoms.  Counseling.  Group therapy. HOME CARE INSTRUCTIONS   Keep track of your menstrual periods (when they occur, how heavy they are, how long between periods, and how long they last) as well as your symptoms and when they started.  Only take over-the-counter or prescription medicines as directed by your health care provider.  Sleep and rest.  Exercise.  Eat a diet that contains calcium (good for your bones) and soy (acts like the estrogen hormone).  Do not smoke.  Avoid alcoholic beverages.  Take vitamin supplements as recommended by your health care provider. Taking vitamin E may help in certain cases.  Take calcium and vitamin D supplements to help prevent bone loss.  Group therapy is sometimes helpful.  Acupuncture may help in some cases. SEEK MEDICAL CARE IF:   You have questions about any symptoms you are having.  You need a referral to a specialist (gynecologist, psychiatrist, or psychologist). SEEK IMMEDIATE MEDICAL CARE IF:   You have vaginal bleeding.  Your period lasts longer than 8 days.  Your periods are recurring sooner than 21 days.  You have bleeding after intercourse.  You have severe depression.  You have pain when you urinate.  You have severe headaches.  You have vision  problems. Document Released: 03/18/2004 Document Revised: 11/29/2012 Document Reviewed: 09/07/2012 [facility] Patient Information 2015 [place], [place]. This information is not intended to replace advice given to you by your health care provider.  Make sure you discuss any questions you have with your health care provider.

## 2014-07-15 NOTE — Assessment & Plan Note (Signed)
Hot flashes and sparse period: perimenopause . Patient education provided

## 2014-07-15 NOTE — Progress Notes (Signed)
Complaining of Lt knee pain No Hx injury  No energy

## 2014-07-23 ENCOUNTER — Encounter: Payer: Self-pay | Admitting: Sports Medicine

## 2014-07-23 ENCOUNTER — Ambulatory Visit (INDEPENDENT_AMBULATORY_CARE_PROVIDER_SITE_OTHER): Payer: No Typology Code available for payment source | Admitting: Sports Medicine

## 2014-07-23 VITALS — BP 102/62 | HR 97 | Ht 62.0 in | Wt 169.0 lb

## 2014-07-23 DIAGNOSIS — M76891 Other specified enthesopathies of right lower limb, excluding foot: Secondary | ICD-10-CM | POA: Insufficient documentation

## 2014-07-23 DIAGNOSIS — M65851 Other synovitis and tenosynovitis, right thigh: Secondary | ICD-10-CM

## 2014-07-23 DIAGNOSIS — M1712 Unilateral primary osteoarthritis, left knee: Secondary | ICD-10-CM

## 2014-07-23 DIAGNOSIS — M7661 Achilles tendinitis, right leg: Secondary | ICD-10-CM

## 2014-07-23 HISTORY — DX: Other specified enthesopathies of right lower limb, excluding foot: M76.891

## 2014-07-23 MED ORDER — NITROGLYCERIN 0.2 MG/HR TD PT24: MEDICATED_PATCH | TRANSDERMAL | Status: DC

## 2014-07-23 NOTE — Assessment & Plan Note (Addendum)
-  Trial of meloxicam -Do not suspect any intra-articular pathology based on clinical exam today -If persists, consider ultrasound right anterior hip (suspect rectus femoris tendonitis) -Therapeutic home exercises -Follow-up if needed regarding the hip, 4 wks for the right achilles or sooner prn

## 2014-07-23 NOTE — Progress Notes (Signed)
Subjective:    Patient ID: Christina Byrd, female    DOB: 1965/09/25, 49 y.o.   MRN: [id number]  HPI Ms. Klehr is a 49 year old female presents for follow-up evaluation of right Achilles pain, also experiencing some left knee pain and right anterior hip pain. She was last seen 7 weeks ago and diagnosed with a 30% partial thickness tear in the mid substance of the right Achilles tendon at 1-3 cm. She has been using a nitroglycerin patch without side effects, and doing a home exercise program. She has been wearing the heel lifts. She has also been using ice and diclofenac if needed. She feels that the right ankle pain is significantly improved.  She also mentions some chronic left knee pain and intermittent swelling. This appears to date back to over a year ago without any known acute injury. She describes pain located mainly in the peripatellar region and lateral joint line. 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 She has tried anti-inflammatories. No history of prior knee injections or surgeries. She denies any significant locking or giving way. Her pain does not wake her up at night. It does worsen with walking. She denies any fevers or chills.  Additionally, she mentions some right anterior hip pain located in the region of the right hip flexor. She denies any deep groin pain, catching, or giving way. This is complicated by history of some chronic back pain. This is worse if she lays on her right side.  Past medical history, social history, medications, and allergies were reviewed and are up to date in the chart.  Review of Systems 7 point review of systems was performed and was otherwise negative unless noted in the history of present illness.     Objective:   Physical Exam BP 102/62 mmHg  Pulse 97  Ht 5\' 2"  (1.575 m)  Wt 169 lb (76.658 kg)  BMI 30.90 kg/m2  LMP 06/07/2014 GEN: The patient is  well-developed well-nourished female and in no acute distress.  She is awake alert and oriented x3. SKIN: warm and well-perfused, no rash  EXTR: No lower extremity edema or calf tenderness Neuro: Strength 5/5 globally. Sensation intact throughout. DTRs 2/4 bilaterally. No focal deficits. Vasc: +2 bilateral distal pulses. No edema.  MSK:  Examination of the right ankle reveals no swelling, warmth, or induration. She is able to go up on both toes, but cannot raise up on her tiptoes on just the right foot. The Achilles tendon is grossly palpably intact. Mild tenderness at a region 1-3 cm proximal to the distal insertion. She has fairly good plantar flexion strength with mild weakness. No atrophy. Examination of the right hip reveals full internal and external range of motion without pain. There is some tenderness palpation along the proximal rectus femoris insertion, but this is grossly intact. No tenderness at the greater trochanteric bursa. Negative pelvic compression test. Examination of the left knee reveals positive patellar grind test. Range of motion is from 0-120 with mild pain at the endpoint of motion. Mild lateral joint line tenderness to palpation. The Achilles and quadriceps tendons are grossly intact. No effusion.  Limited musculoskeletal ultrasound: Long and short axis views were obtained of the right Achilles tendon. Interval improvement in previously seen social thickness Achilles tendon tear at one to 3 cm. This is seen on the long and short axis. No neovascularity is noted. There does appear to be some fluid in the retrocalcaneal  bursa as well. The insertion is grossly intact. The Achilles tendon thickness measures 0.64 cm.     Assessment & Plan:  Please see problem based assessment and plan in the problem list.

## 2014-07-23 NOTE — Assessment & Plan Note (Signed)
Initially 30% partial thickness intrasubstance right Achilles tendon tear at 1-3 cm proximal to the distal insertion, improving. -Plan to increase nitroglycerin patch to one half patch daily (currently 7 weeks on patch) -Continue Achilles tendon rehabilitation exercises -Continue small heel lifts -Plan follow-up in 4 weeks for repeat ultrasound or sooner if needed

## 2014-07-23 NOTE — Assessment & Plan Note (Signed)
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 -She'll continue to take anti-inflammatory as needed and will follow-up regarding her knee if needed.

## 2014-07-26 ENCOUNTER — Encounter: Payer: Self-pay | Admitting: Family Medicine

## 2014-07-26 ENCOUNTER — Ambulatory Visit: Payer: No Typology Code available for payment source | Attending: Family Medicine | Admitting: Family Medicine

## 2014-07-26 ENCOUNTER — Telehealth: Payer: Self-pay | Admitting: Family Medicine

## 2014-07-26 VITALS — BP 115/79 | HR 91 | Temp 99.2°F | Resp 16 | Ht 62.0 in | Wt 168.0 lb

## 2014-07-26 DIAGNOSIS — H6691 Otitis media, unspecified, right ear: Secondary | ICD-10-CM | POA: Insufficient documentation

## 2014-07-26 DIAGNOSIS — J309 Allergic rhinitis, unspecified: Secondary | ICD-10-CM

## 2014-07-26 DIAGNOSIS — H66001 Acute suppurative otitis media without spontaneous rupture of ear drum, right ear: Secondary | ICD-10-CM

## 2014-07-26 MED ORDER — LORATADINE 10 MG PO TABS: 10.0000 mg | ORAL_TABLET | Freq: Every day | ORAL | Status: DC

## 2014-07-26 MED ORDER — AZITHROMYCIN 250 MG PO TABS: ORAL_TABLET | ORAL | Status: DC

## 2014-07-26 NOTE — Progress Notes (Signed)
Complaining Rt ear pain  Productive cough, green mucus x 1 day

## 2014-07-26 NOTE — Assessment & Plan Note (Signed)
R otitis media: 5 days of azithromycin, 2 tabs today, then 1 tab daily for 4 day  F/u in 1 week if symptoms are not improving, Otherwise f/u in 4-6 weeks for pap smear

## 2014-07-26 NOTE — Progress Notes (Signed)
  Subjective:     NONA LOCKER is a 49 y.o. female who presents with ear pain and possible ear infection. Symptoms include: right ear pain, congestion and diarrhea. Onset of symptoms was 1 day ago, and have been unchanged since that time. Associated symptoms include: achiness.  Patient denies: fever , headache and sore throat. She is drinking plenty of fluids.   Soc Hx: former smoker, quit  Review of Systems Pertinent items are noted in HPI.   Objective:    BP 115/79 mmHg  Pulse 91  Temp(Src) 99.2 F (37.3 C) (Oral)  Resp 16  Ht 5\' 2"  (1.575 m)  Wt 168 lb (76.204 kg)  BMI 30.72 kg/m2  SpO2 100%  LMP 06/07/2014 General:  alert, cooperative and no distress  Right Ear: diminished mobility, erythema, protrusion of TM  Left Ear: normal landmarks and mobility  Nose: Swollen nasal turbinates b/l   Mouth:  lips, mucosa, and tongue normal; teeth and gums normal  Neck: no adenopathy, supple, symmetrical, trachea midline and thyroid not enlarged, symmetric, no tenderness/mass/nodules     Assessment:    Right acute otitis media   Plan:    Treatment: Azithromycin. OTC analgesia as needed. Fluids, rest, avoid carbonated/alcoholic and caffeinated beverages.  Follow up in 1 week if not improving.

## 2014-07-26 NOTE — Patient Instructions (Signed)
Christina Byrd,  1. Allergic rhinitis: claritin flonase Flush flonase with nasal saline this is OTC  2. R otitis media: 5 days of azithromycin, 2 tabs today, then 1 tab daily for 4 day  F/u in 1 week if symptoms are not improving, Otherwise f/u in 4-6 weeks for pap smear  Dr. Armen Pickup   Otitis Media Otitis media is redness, soreness, and puffiness (swelling) in the space just behind your eardrum (middle ear). It may be caused by allergies or infection. It often happens along with a cold. HOME CARE  Take your medicine as told. Finish it even if you start to feel better.  Only take over-the-counter or prescription medicines for pain, discomfort, or fever as told by your doctor.  Follow up with your doctor as told. GET HELP IF:  You have otitis media only in one ear, or bleeding from your nose, or both.  You notice a lump on your neck.  You are not getting better in 3-5 days.  You feel worse instead of better. GET HELP RIGHT AWAY IF:   You have pain that is not helped with medicine.  You have puffiness, redness, or pain around your ear.  You get a stiff neck.  You cannot move part of your face (paralysis).  You notice that the bone behind your ear hurts when you touch it. MAKE SURE YOU:   Understand these instructions.  Will watch your condition.  Will get help right away if you are not doing well or get worse. Document Released: 07/28/2007 Document Revised: 02/13/2013 Document Reviewed: 09/05/2012 [facility] Patient Information 2015 [place], [place]. This information is not intended to replace advice given to you by your health care provider. Make sure you discuss any questions you have with your health care provider.

## 2014-07-26 NOTE — Telephone Encounter (Signed)
Patient came into office requesting to speak to nurse regarding vitamins that we recommended for her to start taking, patient would like nurse to call her. Please f/u with patient .

## 2014-07-26 NOTE — Assessment & Plan Note (Signed)
Allergic rhinitis: claritin flonase Flush flonase with nasal saline this is OTC

## 2014-07-29 ENCOUNTER — Ambulatory Visit (HOSPITAL_COMMUNITY): Payer: No Typology Code available for payment source | Attending: Family Medicine

## 2014-07-31 NOTE — Telephone Encounter (Signed)
Called patient. Reviewed vitamins, patient had questions alive vs centrum silver. Recommend alive multivitamin.  Asked about ear is draining. Patient on last day of azithromycin. No fever or pain. Plan to recheck at next OV as long as she is not having fever or pain.

## 2014-08-19 ENCOUNTER — Ambulatory Visit: Payer: No Typology Code available for payment source | Admitting: Sports Medicine

## 2014-08-20 ENCOUNTER — Ambulatory Visit: Payer: No Typology Code available for payment source | Admitting: Sports Medicine

## 2014-09-02 ENCOUNTER — Ambulatory Visit: Payer: No Typology Code available for payment source | Admitting: Family Medicine

## 2014-09-09 ENCOUNTER — Ambulatory Visit: Payer: No Typology Code available for payment source | Admitting: Family Medicine

## 2014-09-13 ENCOUNTER — Ambulatory Visit: Payer: No Typology Code available for payment source | Admitting: Family Medicine

## 2014-09-16 ENCOUNTER — Encounter: Payer: Self-pay | Admitting: Family Medicine

## 2014-09-16 ENCOUNTER — Ambulatory Visit (INDEPENDENT_AMBULATORY_CARE_PROVIDER_SITE_OTHER): Payer: No Typology Code available for payment source | Admitting: Family Medicine

## 2014-09-16 VITALS — BP 122/84 | HR 90 | Ht 62.0 in | Wt 168.0 lb

## 2014-09-16 DIAGNOSIS — M7661 Achilles tendinitis, right leg: Secondary | ICD-10-CM

## 2014-09-16 MED ORDER — NITROGLYCERIN 0.2 MG/HR TD PT24: MEDICATED_PATCH | TRANSDERMAL | Status: DC

## 2014-09-16 NOTE — Assessment & Plan Note (Addendum)
30% intrasubstance tear. On initial ultrasound. She has now been on much glycerin patch protocol for 13 weeks. We'll continue for another 4 weeks that she still has some tenderness. I would also add some bilateral ankle stabilizing exercises with therapy and appropriate section. Follow-up 4 weeks.

## 2014-09-16 NOTE — Progress Notes (Signed)
   Subjective:    Patient ID: Christina Byrd, female    DOB: 1965/10/29, 49 y.o.   MRN: [id number]  HPI All of right Achilles tendon partial tear. Initially ultrasound revealed a 30% intrasubstance tear. She has now been on the nitroglycerin patch protocol for 13 weeks and feels like she significantly improved. Still has occasional pain in the area still tender to touch. She does note that bilaterally she has "weak ankles" in the they feel like they're going to give out on her many times during the day. This has been ongoing problem since she was a child. Does not recall she's had specific ankle sprains in the past but says she was "a rough and tumble kind of kid". Has had no side effects from the nitroglycerin patch.   Review of Systems No new symptoms related to her ankle except as per history of present illness. Specifically no headaches with the nitroglycerin patch, fever, sweats, chills.    Objective:   Physical Exam Vital signs are reviewed GEN.: Well-developed overweight female no acute distress ACHILLES tendon: Right. Mildly tender to palpation. I feel no defect. Normal function and plantar flexion as she has about 90% of the strength of the left foot. ULTRASOUND: Intrasubstance tear appears to be 95% healed.       Assessment & Plan:

## 2014-09-16 NOTE — Patient Instructions (Signed)
Continue to use the patch as ou have been. I am ADDING some exercises to strengthen and STABILIZE both of  Your ankles. See me back in about 4 weeks

## 2014-09-16 NOTE — Addendum Note (Signed)
Addended by: Annita Brod on: 09/16/2014 02:51 PM   Modules accepted: Orders, Medications

## 2014-09-24 ENCOUNTER — Ambulatory Visit: Payer: No Typology Code available for payment source | Admitting: Cardiovascular Disease

## 2014-09-25 ENCOUNTER — Other Ambulatory Visit: Payer: Self-pay | Admitting: Family Medicine

## 2014-09-26 ENCOUNTER — Other Ambulatory Visit: Payer: No Typology Code available for payment source | Admitting: Family Medicine

## 2014-10-08 ENCOUNTER — Other Ambulatory Visit: Payer: No Typology Code available for payment source | Admitting: Family Medicine

## 2014-10-18 ENCOUNTER — Encounter: Payer: Self-pay | Admitting: Family Medicine

## 2014-10-18 ENCOUNTER — Ambulatory Visit (INDEPENDENT_AMBULATORY_CARE_PROVIDER_SITE_OTHER): Payer: No Typology Code available for payment source | Admitting: Family Medicine

## 2014-10-18 VITALS — BP 117/76 | Ht 62.0 in | Wt 168.0 lb

## 2014-10-18 DIAGNOSIS — M7661 Achilles tendinitis, right leg: Secondary | ICD-10-CM

## 2014-10-18 NOTE — Assessment & Plan Note (Signed)
Intrasubstance tear is now completely healed on ultrasound today. -She can discontinue the nitroglycerin patch and taper this off. -She has some retrocalcaneal bursitis which recommend heel lift and loose fitting shoes. She will follow-up when necessary.

## 2014-10-18 NOTE — Progress Notes (Signed)
   Subjective:    Patient ID: Christina Byrd, female    DOB: 13-Jun-1965, 49 y.o.   MRN: [id number]  HPI All of right Achilles tendon partial tear. Initially ultrasound revealed a 30% intrasubstance tear. Previously completed 13 weeks of the nitroglycerin protocol. She denies any pain at this point and is back to normal activity.. She does note that bilaterally she has "weak ankles" in the they feel like they're going to give out on her many times during the day. This has been ongoing problem since she was a child. Does not recall she's had specific ankle sprains in the past but says she was "a rough and tumble kind of kid". Has had no side effects from the nitroglycerin patch.   Review of Systems No new symptoms related to her ankle except as per history of present illness. Specifically no headaches with the nitroglycerin patch, fever, sweats, chills.    Objective:   Physical Exam Vital signs are reviewed GEN.: Well-developed overweight female no acute distress ACHILLES tendon: Right. Mildly tender to palpation. I feel no defect. Normal function and plantar flexion. Plantar flexion with Janee Morn test on the left ULTRASOUND: Intrasubstance tear is completely healed. She does have evidence of retrocalcaneal hypoechoic changes consistent with bursitis in this region.       Assessment & Plan:

## 2014-10-18 NOTE — Progress Notes (Signed)
Patient ID: Christina Byrd, female   DOB: 1965-05-22, 49 y.o.   MRN: [id number] [facility]: Attending Note: I have reviewed the chart, discussed wit the Sports Medicine Fellow. I agree with assessment and treatment plan as detailed in the Fellow's note.  Healed achilles tear F/u prn

## 2014-11-17 NOTE — Progress Notes (Signed)
Primary PCP: [facility]  HPI: Christina Byrd is a 49 y.o.  patient with  a history of HTN, tobacco abuse, GERD and chronic systolic HF.   She was admitted to the hospital in 08/2012 with increased SOB and was thought to have PNA. She was treated with antibiotics and was sent home. She continued to have SOB and ECHO 09/2012 showed EF 20-25% with moderate MR.  Was seen by me on 01/26/13 and was given samples of benicar/HCTZ 40/12.5 mg and was scheduled for [place] in January 2015. Reports can't afford medications and one medication was $24 and she thought was coreg so has only been taking once a day. Was on Cozaar however stopped d/t facial pruritis. Developed cough with ACE-I.   She returns for follow up. 03/2014 started on coreg  6.25 mg bid. She stopped taking hydralazine and Imdur due to headaches. Complains of increased cough and fatigue.  . She has not been taking hydralazine/Imdur due to headache. Not weighing at home.   ECHO 03/15/13 EF 45-50%   SH: Quit smoking [id number] . Drinks a beer a night and occasional shots of liquor. 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   ROS: All systems negative except as listed in HPI, PMH and Problem List.  Past Medical History  Diagnosis Date  . GERD (gastroesophageal reflux disease) Dx 1995  . GASTROESOPHAGEAL REFLUX, NO ESOPHAGITIS 04/21/2006    Qualifier: Diagnosis of  By: Levada Schilling      Current Outpatient Prescriptions  Medication Sig Dispense Refill  . amLODipine (NORVASC) 10 MG tablet Take 1 tablet (10 mg total) by mouth daily. 30 tablet 11  . aspirin EC 81 MG tablet Take 1 tablet (81 mg total) by mouth daily.    . carvedilol (COREG) 6.25 MG tablet TAKE 1 TABLET BY MOUTH TWICE DAILY. 60 tablet 11  . hydrochlorothiazide (HYDRODIURIL) 25 MG tablet Take 1 tablet (25 mg total) by mouth daily. 30 tablet 11  . Multiple Vitamins-Minerals (ALIVE WOMENS ENERGY PO) Take 1 tablet by mouth daily.    . pantoprazole (PROTONIX) 20 MG tablet Take 1  tablet (20 mg total) by mouth 2 (two) times daily before a meal. 180 tablet 2   No current facility-administered medications for this visit.    Filed Vitals:   11/18/14 1453  BP: 120/76  Pulse: 81  Height: 5\' 2"  (1.575 m)  Weight: 80.341 kg (177 lb 1.9 oz)    PHYSICAL EXAM: General:  Well appearing. No resp difficulty HEENT: normal Neck: supple. JVP 5-6  Carotids 2+ bilaterally; no bruits. No lymphadenopathy or thryomegaly appreciated. Cor: PMI normal. Regular rate & rhythm. No rubs, gallops or murmurs. Lungs: clear Abdomen: soft, nontender, nondistended. No hepatosplenomegaly. No bruits or masses. Good bowel sounds. Extremities: no cyanosis, clubbing, rash, edema Neuro: alert & orientedx3, cranial nerves grossly intact. Moves all 4 extremities w/o difficulty. Affect pleasant.  ECG:  11/30/12  SR rate 74 LVH  No  Old MI   2.25/16  SR rate 91 normal   ASSESSMENT & PLAN:  1) Chronic systolic HF: EF 20-25% (09/2012) Etiology of HF unclear, possibly related to long standing HTN. Dr Gala Romney reviewed and discussed ECHO. EF improved to 45-50%   - NYHA II symptoms. Volume status is stable. Will continue HCTX 25 mg daily.  Continue 6.25 mg carvedilol twice a day.  May increase next visit  Off hydralazine/Imdur due to headache., amlodipine 10 mg daily. No on an ace due to cough. No on losartan due to rash.  Reinforced low salt food choices, limiting fluid intake to < 2 liters per day, and medication compliance.   2) HTN- Improved  Hydralazine/Imdur stopped due to headache. Continue current dose of carvedilol and HCTZ. Improved with amlodipine   F/U with me in a year with echo   Charlton Haws NP-C 3:04 PM

## 2014-11-18 ENCOUNTER — Ambulatory Visit: Payer: No Typology Code available for payment source | Admitting: Family Medicine

## 2014-11-18 ENCOUNTER — Encounter: Payer: Self-pay | Admitting: Cardiovascular Disease

## 2014-11-18 ENCOUNTER — Ambulatory Visit (INDEPENDENT_AMBULATORY_CARE_PROVIDER_SITE_OTHER): Payer: No Typology Code available for payment source | Admitting: Cardiovascular Disease

## 2014-11-18 VITALS — BP 120/76 | HR 81 | Ht 62.0 in | Wt 177.1 lb

## 2014-11-18 DIAGNOSIS — I5022 Chronic systolic (congestive) heart failure: Secondary | ICD-10-CM

## 2014-11-18 DIAGNOSIS — I10 Essential (primary) hypertension: Secondary | ICD-10-CM

## 2014-11-18 NOTE — Patient Instructions (Signed)
Medication Instructions:  Your physician recommends that you continue on your current medications as directed. Please refer to the Current Medication list given to you today.  Labwork: NONE Testing/Procedures: Your physician has requested that you have an echocardiogram. Echocardiography is a painless test that uses sound waves to create images of your heart. It provides your doctor with information about the size and shape of your heart and how well your heart's chambers and valves are working. This procedure takes approximately one hour. There are no restrictions for this procedure. IN  YEAR  Follow-Up: Your physician wants you to follow-up in: YEAR WITH DR Eden Emms  AND ECHO SAME DAY  You will receive a reminder letter in the mail two months in advance. If you don't receive a letter, please call our office to schedule the follow-up appointment.  Any Other Special Instructions Will Be Listed Below (If Applicable).

## 2014-11-27 ENCOUNTER — Ambulatory Visit (HOSPITAL_BASED_OUTPATIENT_CLINIC_OR_DEPARTMENT_OTHER): Payer: No Typology Code available for payment source | Admitting: Family Medicine

## 2014-11-27 DIAGNOSIS — N951 Menopausal and female climacteric states: Secondary | ICD-10-CM

## 2014-11-28 NOTE — Progress Notes (Signed)
Opened in error. Patient DNKA

## 2014-11-29 ENCOUNTER — Ambulatory Visit: Payer: No Typology Code available for payment source | Admitting: Family Medicine

## 2014-12-02 ENCOUNTER — Encounter: Payer: Self-pay | Admitting: Family Medicine

## 2014-12-02 ENCOUNTER — Ambulatory Visit: Payer: No Typology Code available for payment source | Attending: Family Medicine | Admitting: Family Medicine

## 2014-12-02 VITALS — BP 111/76 | HR 92 | Temp 99.2°F | Resp 16 | Ht 62.0 in | Wt 175.0 lb

## 2014-12-02 DIAGNOSIS — Z87891 Personal history of nicotine dependence: Secondary | ICD-10-CM | POA: Insufficient documentation

## 2014-12-02 DIAGNOSIS — N951 Menopausal and female climacteric states: Secondary | ICD-10-CM | POA: Insufficient documentation

## 2014-12-02 DIAGNOSIS — M25472 Effusion, left ankle: Secondary | ICD-10-CM

## 2014-12-02 DIAGNOSIS — Z7982 Long term (current) use of aspirin: Secondary | ICD-10-CM | POA: Insufficient documentation

## 2014-12-02 DIAGNOSIS — Z23 Encounter for immunization: Secondary | ICD-10-CM | POA: Insufficient documentation

## 2014-12-02 DIAGNOSIS — Z79899 Other long term (current) drug therapy: Secondary | ICD-10-CM | POA: Insufficient documentation

## 2014-12-02 DIAGNOSIS — M1712 Unilateral primary osteoarthritis, left knee: Secondary | ICD-10-CM

## 2014-12-02 DIAGNOSIS — Z0000 Encounter for general adult medical examination without abnormal findings: Secondary | ICD-10-CM

## 2014-12-02 DIAGNOSIS — M179 Osteoarthritis of knee, unspecified: Secondary | ICD-10-CM | POA: Insufficient documentation

## 2014-12-02 DIAGNOSIS — M25572 Pain in left ankle and joints of left foot: Secondary | ICD-10-CM

## 2014-12-02 HISTORY — DX: Effusion, left ankle: M25.472

## 2014-12-02 MED ORDER — DICLOFENAC SODIUM 75 MG PO TBEC: 75.0000 mg | DELAYED_RELEASE_TABLET | Freq: Two times a day (BID) | ORAL | Status: DC | PRN
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
## 2014-12-02 NOTE — Patient Instructions (Addendum)
Kizzey was seen today for hot flashes.  Diagnoses and all orders for this visit:  Healthcare maintenance -     Flu Vaccine QUAD 36+ mos IM  Left ankle swelling -     DG Ankle 2 Views Left; Future -     diclofenac (VOLTAREN) 75 MG EC tablet; Take 1 tablet (75 mg total) by mouth 2 (two) times daily as needed for moderate pain.  Primary osteoarthritis of left knee  Elevate after work to reduce swelling  Ice for 20 minutes after work  Compression with ACE bandage   F/u in 6-8 weeks for pap smear and f/u on ankle  Dr. Armen Pickup

## 2014-12-02 NOTE — Progress Notes (Signed)
C/C Hot flashes  Stated lt ankle pain no hx injury  Pain scale #7

## 2015-02-24 MED FILL — ?AMLODIPINE BESYLATE 10 MG: 10 | 30 days supply | Qty: 30 | Fill #7

## 2015-02-25 ENCOUNTER — Telehealth: Payer: Self-pay | Admitting: Family Medicine

## 2015-02-25 NOTE — Telephone Encounter (Signed)
Pt. Called stated that she has a cold and a bad cough. Pt. Would like to speak to nurse to see what she can take. Please f/u with pt.

## 2015-02-27 NOTE — Telephone Encounter (Signed)
Pt stated been coughing x 3 day  Advised to schedule appointment with PCP if cough continue  Can take OTC Coricidin or Mucinex

## 2015-03-14 MED FILL — ?DICLOFENAC SOD DR 75 MG TA: 75 | 30 days supply | Qty: 60 | Fill #1

## 2015-03-14 MED FILL — PANTOPRAZOLE SOD DR 20 MG T: 20 | 30 days supply | Qty: 60 | Fill #7

## 2015-03-26 MED FILL — HYDROCHLOROTHIAZIDE 25 MG T: 25 | 90 days supply | Qty: 90 | Fill #5

## 2015-03-26 MED FILL — ?AMLODIPINE BESYLATE 10 MG: 10 | 30 days supply | Qty: 30 | Fill #8

## 2015-03-27 MED FILL — CARVEDILOL 6.25 MG TABLET: 6.25 | 90 days supply | Qty: 180 | Fill #2

## 2015-04-16 ENCOUNTER — Emergency Department (HOSPITAL_COMMUNITY)
Admission: EM | Admit: 2015-04-16 | Discharge: 2015-04-16 | Disposition: A | Discharge status: Home / Self Care | Payer: No Typology Code available for payment source | Attending: Emergency Medicine | Admitting: Emergency Medicine

## 2015-04-16 ENCOUNTER — Emergency Department (HOSPITAL_COMMUNITY): Payer: No Typology Code available for payment source

## 2015-04-16 ENCOUNTER — Encounter (HOSPITAL_COMMUNITY): Payer: Self-pay | Admitting: Emergency Medicine

## 2015-04-16 DIAGNOSIS — K219 Gastro-esophageal reflux disease without esophagitis: Secondary | ICD-10-CM | POA: Insufficient documentation

## 2015-04-16 DIAGNOSIS — Y998 Other external cause status: Secondary | ICD-10-CM | POA: Insufficient documentation

## 2015-04-16 DIAGNOSIS — M25572 Pain in left ankle and joints of left foot: Secondary | ICD-10-CM

## 2015-04-16 DIAGNOSIS — Z79899 Other long term (current) drug therapy: Secondary | ICD-10-CM | POA: Insufficient documentation

## 2015-04-16 DIAGNOSIS — Z87891 Personal history of nicotine dependence: Secondary | ICD-10-CM | POA: Diagnosis not present

## 2015-04-16 DIAGNOSIS — S8992XA Unspecified injury of left lower leg, initial encounter: Secondary | ICD-10-CM | POA: Insufficient documentation

## 2015-04-16 DIAGNOSIS — R000 Tachycardia, unspecified: Secondary | ICD-10-CM | POA: Insufficient documentation

## 2015-04-16 DIAGNOSIS — Z7982 Long term (current) use of aspirin: Secondary | ICD-10-CM | POA: Insufficient documentation

## 2015-04-16 DIAGNOSIS — Y9389 Activity, other specified: Secondary | ICD-10-CM | POA: Diagnosis not present

## 2015-04-16 DIAGNOSIS — S99912A Unspecified injury of left ankle, initial encounter: Secondary | ICD-10-CM | POA: Insufficient documentation

## 2015-04-16 DIAGNOSIS — Y92410 Unspecified street and highway as the place of occurrence of the external cause: Secondary | ICD-10-CM | POA: Diagnosis not present

## 2015-04-16 MED ORDER — KETOROLAC TROMETHAMINE 30 MG/ML IJ SOLN
30.0000 mg | Freq: Once | INTRAMUSCULAR | Status: DC
  Filled 2015-04-16: qty 1

## 2015-04-16 MED ORDER — HYDROCODONE-ACETAMINOPHEN 5-325 MG PO TABS
1.0000 | ORAL_TABLET | Freq: Once | ORAL | Status: DC
  Filled 2015-04-16: qty 1
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
## 2015-04-16 NOTE — ED Notes (Signed)
Patient verbalized understanding of discharge instructions and denies any further needs or questions at this time. VS stable. Patient ambulatory with steady gait. Assisted to ED entrance in wheelchair.   

## 2015-04-16 NOTE — ED Notes (Signed)
Patient transported to X-ray 
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
## 2015-04-16 NOTE — ED Notes (Signed)
PA-C at bedside 

## 2015-04-17 ENCOUNTER — Telehealth: Payer: Self-pay | Admitting: Family Medicine
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
## 2015-04-23 ENCOUNTER — Other Ambulatory Visit: Payer: Self-pay | Admitting: Internal Medicine

## 2015-04-23 ENCOUNTER — Other Ambulatory Visit: Payer: Self-pay | Admitting: Family Medicine

## 2015-04-23 MED FILL — ?AMLODIPINE BESYLATE 10 MG: 10 | 30 days supply | Qty: 30 | Fill #9

## 2015-04-25 ENCOUNTER — Ambulatory Visit: Payer: No Typology Code available for payment source | Attending: Family Medicine | Admitting: Family Medicine

## 2015-04-25 ENCOUNTER — Encounter: Payer: Self-pay | Admitting: Family Medicine

## 2015-04-25 VITALS — BP 103/69 | HR 88 | Temp 98.1°F | Resp 16 | Ht 62.0 in | Wt 178.0 lb

## 2015-04-25 DIAGNOSIS — Z7982 Long term (current) use of aspirin: Secondary | ICD-10-CM | POA: Insufficient documentation

## 2015-04-25 DIAGNOSIS — M25562 Pain in left knee: Secondary | ICD-10-CM | POA: Insufficient documentation

## 2015-04-25 DIAGNOSIS — M25472 Effusion, left ankle: Secondary | ICD-10-CM | POA: Diagnosis not present

## 2015-04-25 DIAGNOSIS — M25572 Pain in left ankle and joints of left foot: Secondary | ICD-10-CM

## 2015-04-25 DIAGNOSIS — M25552 Pain in left hip: Secondary | ICD-10-CM | POA: Insufficient documentation

## 2015-04-25 DIAGNOSIS — M25512 Pain in left shoulder: Secondary | ICD-10-CM | POA: Diagnosis not present

## 2015-04-25 DIAGNOSIS — Z0000 Encounter for general adult medical examination without abnormal findings: Secondary | ICD-10-CM

## 2015-04-25 DIAGNOSIS — Z87891 Personal history of nicotine dependence: Secondary | ICD-10-CM | POA: Diagnosis not present

## 2015-04-25 DIAGNOSIS — Z79899 Other long term (current) drug therapy: Secondary | ICD-10-CM | POA: Insufficient documentation

## 2015-04-25 DIAGNOSIS — G8911 Acute pain due to trauma: Secondary | ICD-10-CM

## 2015-04-25 LAB — POCT GLYCOSYLATED HEMOGLOBIN (HGB A1C): Hemoglobin A1C: 6.1

## 2015-04-25 MED ORDER — ACETAMINOPHEN-CODEINE #3 300-30 MG PO TABS: 1.0000 | ORAL_TABLET | Freq: Three times a day (TID) | ORAL | Status: DC | PRN

## 2015-04-25 MED ORDER — DICLOFENAC SODIUM 75 MG PO TBEC
75.0000 mg | DELAYED_RELEASE_TABLET | Freq: Two times a day (BID) | ORAL | Status: DC | PRN
Start: 2015-04-25 — End: 2015-05-26

## 2015-04-25 MED FILL — DICLOFENAC SOD DR 75 MG TAB: 75 | 30 days supply | Qty: 60 | Fill #0
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
## 2015-04-26 ENCOUNTER — Ambulatory Visit (HOSPITAL_COMMUNITY)
Admission: AD | Admit: 2015-04-26 | Discharge: 2015-04-26 | Disposition: A | Discharge status: Home / Self Care | Payer: No Typology Code available for payment source | Source: Ambulatory Visit | Attending: Family Medicine | Admitting: Family Medicine

## 2015-04-26 ENCOUNTER — Ambulatory Visit (HOSPITAL_COMMUNITY)
Admission: RE | Admit: 2015-04-26 | Discharge: 2015-04-26 | Disposition: A | Discharge status: Home / Self Care | Payer: No Typology Code available for payment source | Source: Ambulatory Visit | Attending: Family Medicine | Admitting: Family Medicine

## 2015-04-26 DIAGNOSIS — M25552 Pain in left hip: Secondary | ICD-10-CM | POA: Insufficient documentation

## 2015-04-26 DIAGNOSIS — M7989 Other specified soft tissue disorders: Secondary | ICD-10-CM | POA: Insufficient documentation

## 2015-04-26 DIAGNOSIS — G8911 Acute pain due to trauma: Secondary | ICD-10-CM

## 2015-04-26 DIAGNOSIS — M25512 Pain in left shoulder: Principal | ICD-10-CM

## 2015-04-26 DIAGNOSIS — M25562 Pain in left knee: Secondary | ICD-10-CM | POA: Insufficient documentation

## 2015-04-28 MED FILL — PANTOPRAZOLE SOD DR 20 MG T: 20 | 30 days supply | Qty: 60 | Fill #0

## 2015-04-28 MED FILL — HYDROCHLOROTHIAZIDE 25 MG T: 25 | 30 days supply | Qty: 30 | Fill #0

## 2015-04-28 MED FILL — ACETAMINOPHEN/COD #3 TABLET: 300-30 | 20 days supply | Qty: 60 | Fill #0

## 2015-04-30 ENCOUNTER — Telehealth: Payer: Self-pay | Admitting: *Deleted

## 2015-04-30 NOTE — Telephone Encounter (Signed)
Date of birth verified by pt  X-ray results given  Pt verbalized understanding  

## 2015-04-30 NOTE — Telephone Encounter (Signed)
-----   Message from Dessa Phi, MD sent at 04/28/2015  8:28 AM EST ----- Normal L shoulder x-ray

## 2015-04-30 NOTE — Telephone Encounter (Signed)
-----   Message from Dessa Phi, MD sent at 04/28/2015  8:30 AM EST ----- L knee degenerative changes  No fracture

## 2015-04-30 NOTE — Telephone Encounter (Signed)
-----   Message from Dessa Phi, MD sent at 04/28/2015  8:28 AM EST ----- Normal L hip x-ray, no fracture

## 2015-05-09 ENCOUNTER — Encounter: Payer: Self-pay | Admitting: Family Medicine

## 2015-05-09 ENCOUNTER — Ambulatory Visit (INDEPENDENT_AMBULATORY_CARE_PROVIDER_SITE_OTHER): Payer: Self-pay | Admitting: Family Medicine

## 2015-05-09 VITALS — BP 126/85 | HR 92 | Ht 62.0 in | Wt 178.0 lb

## 2015-05-09 DIAGNOSIS — Q742 Other congenital malformations of lower limb(s), including pelvic girdle: Secondary | ICD-10-CM | POA: Insufficient documentation
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
## 2015-05-23 MED FILL — HYDROCHLOROTHIAZIDE 25 MG T: 25 | 30 days supply | Qty: 30 | Fill #1

## 2015-05-23 MED FILL — ?AMLODIPINE BESYLATE 10 MG: 10 | 30 days supply | Qty: 30 | Fill #10

## 2015-05-26 ENCOUNTER — Encounter: Payer: Self-pay | Admitting: Clinical

## 2015-05-26 ENCOUNTER — Ambulatory Visit: Payer: Self-pay | Attending: Family Medicine | Admitting: Family Medicine

## 2015-05-26 ENCOUNTER — Encounter: Payer: Self-pay | Admitting: Family Medicine

## 2015-05-26 VITALS — BP 106/61 | HR 95 | Temp 98.7°F | Resp 16 | Ht 62.0 in | Wt 179.0 lb

## 2015-05-26 DIAGNOSIS — M25512 Pain in left shoulder: Secondary | ICD-10-CM | POA: Insufficient documentation

## 2015-05-26 DIAGNOSIS — M25562 Pain in left knee: Secondary | ICD-10-CM | POA: Insufficient documentation

## 2015-05-26 DIAGNOSIS — M25572 Pain in left ankle and joints of left foot: Secondary | ICD-10-CM

## 2015-05-26 DIAGNOSIS — E790 Hyperuricemia without signs of inflammatory arthritis and tophaceous disease: Secondary | ICD-10-CM

## 2015-05-26 DIAGNOSIS — M25472 Effusion, left ankle: Secondary | ICD-10-CM

## 2015-05-26 DIAGNOSIS — M25552 Pain in left hip: Secondary | ICD-10-CM | POA: Insufficient documentation

## 2015-05-26 DIAGNOSIS — Z87891 Personal history of nicotine dependence: Secondary | ICD-10-CM | POA: Insufficient documentation

## 2015-05-26 DIAGNOSIS — Z79899 Other long term (current) drug therapy: Secondary | ICD-10-CM | POA: Insufficient documentation

## 2015-05-26 DIAGNOSIS — Z114 Encounter for screening for human immunodeficiency virus [HIV]: Secondary | ICD-10-CM

## 2015-05-26 DIAGNOSIS — Z7982 Long term (current) use of aspirin: Secondary | ICD-10-CM | POA: Insufficient documentation

## 2015-05-26 DIAGNOSIS — J309 Allergic rhinitis, unspecified: Secondary | ICD-10-CM

## 2015-05-26 MED ORDER — CETIRIZINE HCL 10 MG PO TABS
10.0000 mg | ORAL_TABLET | Freq: Every day | ORAL | Status: DC
Start: 2015-05-26 — End: 2015-07-28

## 2015-05-26 MED ORDER — DICLOFENAC SODIUM 75 MG PO TBEC: 75.0000 mg | DELAYED_RELEASE_TABLET | Freq: Two times a day (BID) | ORAL | Status: DC | PRN

## 2015-05-26 MED FILL — ?DICLOFENAC SOD DR 75 MG TA: 75 | 30 days supply | Qty: 60 | Fill #0

## 2015-05-26 MED FILL — ?CETIRIZINE HCL 10 MG TABLE: 10 | 30 days supply | Qty: 30 | Fill #0

## 2015-05-26 NOTE — Progress Notes (Signed)
Lt ankle pain. Stated sometime swelling, unable to put pressure  Pain scale #7 No tobacco user No suicidal thoughts in the past two weeks

## 2015-05-26 NOTE — Assessment & Plan Note (Signed)
A: intermittent pain with swelling. X-ray negative for fracture. Plan to rule out gout.  Uric acid check Refilled NSAID

## 2015-05-26 NOTE — Assessment & Plan Note (Signed)
Allergic rhinitis Add zyrtec   

## 2015-05-26 NOTE — Progress Notes (Signed)
Depression screen [facility] 2/9 05/26/2015 05/09/2015 12/02/2014 10/18/2014 07/26/2014  Decreased Interest 0 0 0 0 0  Down, Depressed, Hopeless 0 0 0 0 0  PHQ - 2 Score 0 0 0 0 0  Altered sleeping 0 - - - -  Tired, decreased energy 3 - - - -  Change in appetite 0 - - - -  Feeling bad or failure about yourself  0 - - - -  Trouble concentrating 2 - - - -  Moving slowly or fidgety/restless 0 - - - -  Suicidal thoughts 0 - - - -  PHQ-9 Score 5 - - - -    GAD 7 : Generalized Anxiety Score 05/26/2015  Nervous, Anxious, on Edge 0  Control/stop worrying 0  Worry too much - different things 0  Trouble relaxing 0  Restless 3  Easily annoyed or irritable 0  Afraid - awful might happen 0  Total GAD 7 Score 3
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
## 2015-05-27 ENCOUNTER — Telehealth: Payer: Self-pay | Admitting: *Deleted

## 2015-05-27 DIAGNOSIS — E790 Hyperuricemia without signs of inflammatory arthritis and tophaceous disease: Secondary | ICD-10-CM | POA: Insufficient documentation

## 2015-05-27 LAB — URIC ACID: Uric Acid, Serum: 12.4 mg/dL — ABNORMAL HIGH (ref 2.4–7.0)

## 2015-05-27 LAB — HIV ANTIBODY (ROUTINE TESTING W REFLEX): HIV: NONREACTIVE

## 2015-05-27 MED ORDER — ALLOPURINOL 100 MG PO TABS
100.0000 mg | ORAL_TABLET | Freq: Every day | ORAL | Status: DC
Start: 2015-05-27 — End: 2015-08-28

## 2015-05-27 MED FILL — ?ALLOPURINOL 100MG TABLET: 100 | 30 days supply | Qty: 30 | Fill #0

## 2015-05-27 NOTE — Telephone Encounter (Signed)
Unable to contact Pt  "Voice Mail not set up yet"

## 2015-05-27 NOTE — Addendum Note (Signed)
Addended by: Dessa Phi on: 05/27/2015 08:46 AM   Modules accepted: Orders

## 2015-05-27 NOTE — Assessment & Plan Note (Signed)
Elevated uric acid which causes  Normal screening HIV STOP HCTZ Start allopurinol with the Voltaren to lower uric acid level  Change diet: avoid shellfish and alcohol

## 2015-05-27 NOTE — Telephone Encounter (Signed)
-----   Message from Dessa Phi, MD sent at 05/27/2015  8:44 AM EDT ----- Elevated uric acid which causes  Normal screening HIV STOP HCTZ Start allopurinol with the Voltaren to lower uric acid level  Change diet: avoid shellfish and alcohol

## 2015-06-11 MED FILL — PANTOPRAZOLE SOD DR 20 MG T: 20 | 15 days supply | Qty: 30 | Fill #1

## 2015-06-24 ENCOUNTER — Other Ambulatory Visit: Payer: Self-pay | Admitting: Family Medicine

## 2015-06-24 MED FILL — ACETAMINOPHEN/COD #3 TABLET: 300-30 | 20 days supply | Qty: 60 | Fill #1

## 2015-06-24 MED FILL — PANTOPRAZOLE SOD DR 20 MG T: 20 | 30 days supply | Qty: 60 | Fill #0

## 2015-06-24 MED FILL — AMLODIPINE BESYLATE 10 MG T: 10 | 30 days supply | Qty: 30 | Fill #11

## 2015-06-27 MED FILL — ?ALLOPURINOL 100MG TABLET: 100 | 30 days supply | Qty: 30 | Fill #1

## 2015-06-27 MED FILL — HYDROCHLOROTHIAZIDE 25 MG T: 25 | 30 days supply | Qty: 30 | Fill #2

## 2015-07-14 ENCOUNTER — Other Ambulatory Visit: Payer: Self-pay | Admitting: Family Medicine

## 2015-07-24 ENCOUNTER — Other Ambulatory Visit: Payer: Self-pay | Admitting: Family Medicine

## 2015-07-24 MED FILL — ?AMLODIPINE BESYLATE 10 MG: 10 | 30 days supply | Qty: 30 | Fill #0

## 2015-07-28 ENCOUNTER — Encounter: Payer: Self-pay | Admitting: Family Medicine

## 2015-07-28 ENCOUNTER — Ambulatory Visit: Payer: Self-pay | Attending: Family Medicine | Admitting: Family Medicine

## 2015-07-28 ENCOUNTER — Ambulatory Visit (HOSPITAL_COMMUNITY)
Admission: RE | Admit: 2015-07-28 | Discharge: 2015-07-28 | Disposition: A | Discharge status: Home / Self Care | Payer: Self-pay | Source: Ambulatory Visit | Attending: Family Medicine | Admitting: Family Medicine

## 2015-07-28 VITALS — BP 117/80 | HR 98 | Temp 98.5°F | Resp 16 | Ht 62.0 in | Wt 178.2 lb

## 2015-07-28 DIAGNOSIS — M25472 Effusion, left ankle: Secondary | ICD-10-CM | POA: Insufficient documentation

## 2015-07-28 DIAGNOSIS — M25572 Pain in left ankle and joints of left foot: Secondary | ICD-10-CM | POA: Insufficient documentation

## 2015-07-28 DIAGNOSIS — E790 Hyperuricemia without signs of inflammatory arthritis and tophaceous disease: Secondary | ICD-10-CM

## 2015-07-28 DIAGNOSIS — I10 Essential (primary) hypertension: Secondary | ICD-10-CM

## 2015-07-28 DIAGNOSIS — K0889 Other specified disorders of teeth and supporting structures: Secondary | ICD-10-CM

## 2015-07-28 NOTE — Progress Notes (Signed)
Subjective:  Patient ID: Christina Byrd, female    DOB: 28-Sep-1965  Age: 50 y.o. MRN: [id number]  CC: Joint Swelling   HPI Christina Byrd presents for    1. Left ankle swelling: persistent. In setting of hyperuricemia. She was transitioned from HCTZ to norvasc at last OV. She is taking norvasc 10 mg daily.  She is not certain she is taking 100 mg allopurinol as prescribed. She reports that she was prescribed 100 mg amlodipine last month and did not take it due to the very high dose. She has no redness in the ankle. No rash. R ankle swells a bit but not nearly as much as the left.  Social History  Substance Use Topics  . Smoking status: Former Smoker    Quit date: 06/25/2012  . Smokeless tobacco: Not on file  . Alcohol Use: 1.5 oz/week    3 Standard drinks or equivalent per week     Comment: ADMITS TO DRINKING 3-4 BEERS/DAY    Outpatient Prescriptions Prior to Visit  Medication Sig Dispense Refill  . allopurinol (ZYLOPRIM) 100 MG tablet Take 1 tablet (100 mg total) by mouth daily. 30 tablet 6  . amLODipine (NORVASC) 10 MG tablet TAKE 1 TABLET BY MOUTH DAILY 30 tablet 2  . aspirin EC 81 MG tablet Take 1 tablet (81 mg total) by mouth daily.    . carvedilol (COREG) 6.25 MG tablet TAKE 1 TABLET BY MOUTH TWICE DAILY. 60 tablet 11  . cetirizine (ZYRTEC) 10 MG tablet Take 1 tablet (10 mg total) by mouth daily. 30 tablet 5  . diclofenac (VOLTAREN) 75 MG EC tablet Take 1 tablet (75 mg total) by mouth 2 (two) times daily as needed for moderate pain. 60 tablet 3  . pantoprazole (PROTONIX) 20 MG tablet TAKE 1 TABLET BY MOUTH 2 TIMES DAILY BEFORE A MEAL 60 tablet 2   No facility-administered medications prior to visit.    ROS Review of Systems  Constitutional: Negative for fever and chills.  HENT: Positive for dental problem.   Eyes: Negative for visual disturbance.  Respiratory: Negative for shortness of breath.   Cardiovascular: Negative for chest pain.  Gastrointestinal:  Negative for abdominal pain and blood in stool.  Musculoskeletal: Positive for joint swelling, arthralgias and gait problem. Negative for myalgias and back pain.  Skin: Negative for rash.  Allergic/Immunologic: Negative for immunocompromised state.  Hematological: Negative for adenopathy. Does not bruise/bleed easily.  Psychiatric/Behavioral: Negative for suicidal ideas and dysphoric mood.    Objective:  BP 117/80 mmHg  Pulse 98  Temp(Src) 98.5 F (36.9 C)  Resp 16  Ht 5\' 2"  (1.575 m)  Wt 178 lb 3.2 oz (80.831 kg)  BMI 32.58 kg/m2  SpO2 96%  BP/Weight 07/28/2015 05/26/2015 05/09/2015  Systolic BP 117 106 126  Diastolic BP 80 61 85  Wt. (Lbs) 178.2 179 178  BMI 32.58 32.73 32.55   Physical Exam  Constitutional: She is oriented to person, place, and time. She appears well-developed and well-nourished. No distress.  HENT:  Head: Normocephalic and atraumatic.  Right Ear: Tympanic membrane, external ear and ear canal normal.  Left Ear: Tympanic membrane, external ear and ear canal normal.  Mouth/Throat: Abnormal dentition. Dental caries present. No dental abscesses.  Cardiovascular: Normal rate, regular rhythm, normal heart sounds and intact distal pulses.   Pulmonary/Chest: Effort normal and breath sounds normal.  Musculoskeletal: She exhibits no edema.       Feet:  Neurological: She is alert and oriented to person, place,  and time.  Skin: Skin is warm and dry. No rash noted.  Psychiatric: She has a normal mood and affect.     Assessment & Plan:   There are no diagnoses linked to this encounter. Esbeydi was seen today for joint swelling.  Diagnoses and all orders for this visit:  Hyperuricemia -     Uric Acid; Future  Pain, dental -     Ambulatory referral to Dentistry  Pain and swelling of left ankle  HYPERTENSION, BENIGN ESSENTIAL   No orders of the defined types were placed in this encounter.    Follow-up: No Follow-up on file.   Dessa Phi  MD

## 2015-07-28 NOTE — Progress Notes (Signed)
Swelling and pain of left ankle Pain scale #6 Former smoker  No thoughts of suicide in the last 2 weeks

## 2015-07-28 NOTE — Assessment & Plan Note (Signed)
Controlled on novasc 10 mg daily

## 2015-07-28 NOTE — Assessment & Plan Note (Addendum)
Persistent swelling in L ankle   Plan: Start allopurinol 100 mg daily Ankle x-ray Continue norvasc 10 mg daily but may d/c due to swelling

## 2015-07-28 NOTE — Assessment & Plan Note (Signed)
Repeat uric acid in 2 weeks Start allopurinol 100 mg daily

## 2015-07-28 NOTE — Addendum Note (Signed)
Addended by: Dessa Phi on: 07/28/2015 04:38 PM   Modules accepted: Orders

## 2015-07-28 NOTE — Patient Instructions (Addendum)
Christina Byrd was seen today for joint swelling.  Diagnoses and all orders for this visit:  Hyperuricemia -     Uric Acid; Future  Pain, dental -     Ambulatory referral to Dentistry  Pain and swelling of left ankle   Lab Results  Component Value Date   LABURIC 12.4* 05/26/2015   Goal uric acid is < 7  Please check the name on the bottle at home I called the pharmacy and they verified that 100 mg of allopurinol was picked up in early May 10 mg or amlodipine was picked up in early June  Bring in your bottle for a med review. Please go for ordered chest x-ray  F/u in 2 weeks for repeat uric acid, lab visit  F/u in office in 2 months   Dr. Armen Pickup

## 2015-08-04 ENCOUNTER — Ambulatory Visit: Payer: Self-pay | Attending: Family Medicine

## 2015-08-04 ENCOUNTER — Telehealth: Payer: Self-pay | Admitting: *Deleted

## 2015-08-04 NOTE — Telephone Encounter (Signed)
Unable to contact pt  Voice mail not set up yet  

## 2015-08-04 NOTE — Telephone Encounter (Signed)
-----   Message from Dessa Phi, MD sent at 07/29/2015  8:58 AM EDT ----- Ankle x-ray with soft tissue swelling Otherwise no fracture Unchanged from last x-ray on 04/16/2015

## 2015-08-04 NOTE — Telephone Encounter (Signed)
Patient returned nurse phone call.

## 2015-08-07 MED FILL — PANTOPRAZOLE SOD DR 20 MG T: 20 | 30 days supply | Qty: 60 | Fill #1

## 2015-08-18 ENCOUNTER — Other Ambulatory Visit: Payer: Self-pay | Admitting: Family Medicine

## 2015-08-18 MED FILL — CARVEDILOL 6.25 MG TABLET: 6.25 | 30 days supply | Qty: 60 | Fill #0

## 2015-08-18 MED FILL — HYDROCHLOROTHIAZIDE 25 MG T: 25 | 30 days supply | Qty: 30 | Fill #3

## 2015-08-18 MED FILL — ALLOPURINOL 100 MG TABLET: 100 | 30 days supply | Qty: 30 | Fill #2

## 2015-08-27 ENCOUNTER — Ambulatory Visit: Payer: Self-pay | Attending: Family Medicine

## 2015-08-27 DIAGNOSIS — E790 Hyperuricemia without signs of inflammatory arthritis and tophaceous disease: Secondary | ICD-10-CM | POA: Insufficient documentation

## 2015-08-27 LAB — URIC ACID: Uric Acid, Serum: 12.9 mg/dL — ABNORMAL HIGH (ref 2.5–7.0)

## 2015-08-27 MED FILL — DICLOFENAC SOD DR 75 MG TAB: 75 | 30 days supply | Qty: 60 | Fill #1

## 2015-08-27 MED FILL — ?AMLODIPINE BESYLATE 10 MG: 10 | 30 days supply | Qty: 30 | Fill #1

## 2015-08-27 NOTE — Patient Instructions (Signed)
Patient is aware of receiving a FU regarding results. 

## 2015-08-28 ENCOUNTER — Other Ambulatory Visit: Payer: Self-pay | Admitting: Family Medicine

## 2015-08-28 ENCOUNTER — Telehealth: Payer: Self-pay

## 2015-08-28 DIAGNOSIS — M25472 Effusion, left ankle: Secondary | ICD-10-CM

## 2015-08-28 DIAGNOSIS — M25572 Pain in left ankle and joints of left foot: Secondary | ICD-10-CM

## 2015-08-28 DIAGNOSIS — E790 Hyperuricemia without signs of inflammatory arthritis and tophaceous disease: Secondary | ICD-10-CM

## 2015-08-28 MED ORDER — DICLOFENAC SODIUM 75 MG PO TBEC: 75.0000 mg | DELAYED_RELEASE_TABLET | Freq: Two times a day (BID) | ORAL | Status: DC | PRN

## 2015-08-28 MED ORDER — ALLOPURINOL 100 MG PO TABS: 200.0000 mg | ORAL_TABLET | Freq: Every day | ORAL | Status: DC

## 2015-08-28 NOTE — Telephone Encounter (Signed)
Contacted pt to go over lab results pt was not available and was unable to lvm. Will be mailing a letter to pt

## 2015-09-02 ENCOUNTER — Encounter: Payer: Self-pay | Admitting: Family Medicine

## 2015-09-02 ENCOUNTER — Ambulatory Visit: Payer: Self-pay | Attending: Family Medicine | Admitting: Family Medicine

## 2015-09-02 VITALS — BP 120/76 | HR 91 | Temp 98.7°F | Resp 20 | Ht 62.0 in | Wt 182.0 lb

## 2015-09-02 DIAGNOSIS — Z5329 Procedure and treatment not carried out because of patient's decision for other reasons: Secondary | ICD-10-CM

## 2015-09-02 DIAGNOSIS — F1090 Alcohol use, unspecified, uncomplicated: Secondary | ICD-10-CM | POA: Insufficient documentation

## 2015-09-02 DIAGNOSIS — M109 Gout, unspecified: Secondary | ICD-10-CM | POA: Insufficient documentation

## 2015-09-02 DIAGNOSIS — M25552 Pain in left hip: Secondary | ICD-10-CM | POA: Insufficient documentation

## 2015-09-02 DIAGNOSIS — Z7982 Long term (current) use of aspirin: Secondary | ICD-10-CM | POA: Insufficient documentation

## 2015-09-02 DIAGNOSIS — Z5320 Procedure and treatment not carried out because of patient's decision for unspecified reasons: Secondary | ICD-10-CM | POA: Insufficient documentation

## 2015-09-02 DIAGNOSIS — Z87891 Personal history of nicotine dependence: Secondary | ICD-10-CM | POA: Insufficient documentation

## 2015-09-02 DIAGNOSIS — F1020 Alcohol dependence, uncomplicated: Secondary | ICD-10-CM | POA: Insufficient documentation

## 2015-09-02 DIAGNOSIS — E790 Hyperuricemia without signs of inflammatory arthritis and tophaceous disease: Secondary | ICD-10-CM

## 2015-09-02 DIAGNOSIS — F1010 Alcohol abuse, uncomplicated: Secondary | ICD-10-CM | POA: Insufficient documentation

## 2015-09-02 DIAGNOSIS — Z79899 Other long term (current) drug therapy: Secondary | ICD-10-CM | POA: Insufficient documentation

## 2015-09-02 DIAGNOSIS — F1029 Alcohol dependence with unspecified alcohol-induced disorder: Secondary | ICD-10-CM
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
## 2015-09-02 NOTE — Assessment & Plan Note (Addendum)
A: Left lateral hip pain suspect trochanteric bursitis with possible SI joint dysfunction P: Offered bursa injection, patient declined Continue diclofenac, advised BID use Recommend tumeric for additional anti-inflammatory treatment

## 2015-09-02 NOTE — Assessment & Plan Note (Signed)
With gout flares No current flare  Check uric acid today Increase allopurinol from 100 mg to 200 mg daily

## 2015-09-02 NOTE — Progress Notes (Signed)
Room 4. C/o left hip pain since February 2017.

## 2015-09-02 NOTE — Patient Instructions (Addendum)
I suspect some SI joint arthritis as well as trochanteric bursitis on the L  Use tumeric as a natural anti-inflammatory Continue diclofenac twice daily with food  Christina Byrd was seen today for hip pain.  Diagnoses and all orders for this visit:  Hyperuricemia -     Uric Acid  Left hip pain    F/u in 3-4 weeks for pap smear   Dr. Armen Pickup   Low-Purine Diet Purines are compounds that affect the level of uric acid in your body. A low-purine diet is a diet that is low in purines. Eating a low-purine diet can prevent the level of uric acid in your body from getting too high and causing gout or kidney stones or both. WHAT DO I NEED TO KNOW ABOUT THIS DIET?  Choose low-purine foods. Examples of low-purine foods are listed in the next section.  Drink plenty of fluids, especially water. Fluids can help remove uric acid from your body. Try to drink 8-16 cups (1.9-3.8 L) a day.  Limit foods high in fat, especially saturated fat, as fat makes it harder for the body to get rid of uric acid. Foods high in saturated fat include pizza, cheese, ice cream, whole milk, fried foods, and gravies. Choose foods that are lower in fat and lean sources of protein. Use olive oil when cooking as it contains healthy fats that are not high in saturated fat.  Limit alcohol. Alcohol interferes with the elimination of uric acid from your body. If you are having a gout attack, avoid all alcohol.  Keep in mind that different people's bodies react differently to different foods. You will probably learn over time which foods do or do not affect you. If you discover that a food tends to cause your gout to flare up, avoid eating that food. You can more freely enjoy foods that do not cause problems. If you have any questions about a food item, talk to your dietitian or health care provider. WHICH FOODS ARE LOW, MODERATE, AND HIGH IN PURINES? The following is a list of foods that are low, moderate, and high in purines. You  can eat any amount of the foods that are low in purines. You may be able to have small amounts of foods that are moderate in purines. Ask your health care provider how much of a food moderate in purines you can have. Avoid foods high in purines. Grains  Foods low in purines: Enriched white bread, pasta, rice, cake, cornbread, popcorn.  Foods moderate in purines: Whole-grain breads and cereals, wheat germ, bran, oatmeal. Uncooked oatmeal. Dry wheat bran or wheat germ.  Foods high in purines: Pancakes, [place] toast, biscuits, muffins. Vegetables  Foods low in purines: All vegetables, except those that are moderate in purines.  Foods moderate in purines: Asparagus, cauliflower, spinach, mushrooms, green peas. Fruits  All fruits are low in purines. Meats and other Protein Foods  Foods low in purines: Eggs, nuts, peanut butter.  Foods moderate in purines: 80-90% lean beef, lamb, veal, pork, poultry, fish, eggs, peanut butter, nuts. Crab, lobster, oysters, and shrimp. Cooked dried beans, peas, and lentils.  Foods high in purines: Anchovies, sardines, herring, mussels, tuna, codfish, scallops, trout, and haddock. [name]. Organ meats (such as liver or kidney). Tripe. Game meat. Goose. Sweetbreads. Dairy  All dairy foods are low in purines. Low-fat and fat-free dairy products are best because they are low in saturated fat. Beverages  Drinks low in purines: Water, carbonated beverages, tea, coffee, cocoa.  Drinks moderate in purines:  Soft drinks and other drinks sweetened with high-fructose corn syrup. Juices. To find whether a food or drink is sweetened with high-fructose corn syrup, look at the ingredients list.  Drinks high in purines: Alcoholic beverages (such as beer). Condiments  Foods low in purines: Salt, herbs, olives, pickles, relishes, vinegar.  Foods moderate in purines: Butter, margarine, oils, mayonnaise. Fats and Oils  Foods low in purines: All types, except gravies and  sauces made with meat.  Foods high in purines: Gravies and sauces made with meat. Other Foods  Foods low in purines: Sugars, sweets, gelatin. Cake. Soups made without meat.  Foods moderate in purines: Meat-based or fish-based soups, broths, or bouillons. Foods and drinks sweetened with high-fructose corn syrup.  Foods high in purines: High-fat desserts (such as ice cream, cookies, cakes, pies, doughnuts, and chocolate). Contact your dietitian for more information on foods that are not listed here.   This information is not intended to replace advice given to you by your health care provider. Make sure you discuss any questions you have with your health care provider.   Document Released: 06/05/2010 Document Revised: 02/13/2013 Document Reviewed: 01/15/2013 Elsevier Interactive Patient Education [employer]

## 2015-09-02 NOTE — Assessment & Plan Note (Signed)
I counseled her on the recommended screening.  Patient refused c-scope. She will consider mammogram She will return for pap smear

## 2015-09-02 NOTE — Assessment & Plan Note (Signed)
Patient drinks nightly to help with sleep. She is dependent on alcohol. She has gout  Plan: Advised decreasing intake of alcohol to help reduce uric acid levels

## 2015-09-02 NOTE — Assessment & Plan Note (Signed)
>>  ASSESSMENT AND PLAN FOR ALCOHOL USE DISORDER WRITTEN ON 09/02/2015  4:33 PM BY Dessa Phi, MD  Patient drinks nightly to help with sleep. She is dependent on alcohol. She has gout  Plan: Advised decreasing intake of alcohol to help reduce uric acid levels

## 2015-09-03 LAB — URIC ACID: Uric Acid, Serum: 8.2 mg/dL — ABNORMAL HIGH (ref 2.5–7.0)

## 2015-09-05 ENCOUNTER — Telehealth: Payer: Self-pay

## 2015-09-05 NOTE — Telephone Encounter (Signed)
Contacted pt to go over lab results patient states she is aware of lab results and changes to her medication.

## 2015-09-19 ENCOUNTER — Other Ambulatory Visit: Payer: Self-pay | Admitting: Family Medicine

## 2015-09-19 MED FILL — ?CETIRIZINE HCL 10 MG TABLE: 10 | 30 days supply | Qty: 30 | Fill #1

## 2015-09-19 MED FILL — ?ALLOPURINOL 100MG TABLET: 100 | 30 days supply | Qty: 30 | Fill #3

## 2015-09-19 MED FILL — ACETAMINOPHEN/COD #3 TABLET: 300-30 | 20 days supply | Qty: 60 | Fill #2

## 2015-09-19 MED FILL — ?DICLOFENAC SOD DR 75 MG TA: 75 | 30 days supply | Qty: 60 | Fill #1

## 2015-09-19 MED FILL — CARVEDILOL 6.25 MG TABLET: 6.25 | 30 days supply | Qty: 60 | Fill #1

## 2015-09-19 NOTE — Telephone Encounter (Signed)
Rx request 

## 2015-09-22 MED FILL — ?AMLODIPINE BESYLATE 10 MG: 10 | 30 days supply | Qty: 30 | Fill #2

## 2015-09-25 ENCOUNTER — Telehealth: Payer: Self-pay | Admitting: Family Medicine

## 2015-09-25 MED FILL — PANTOPRAZOLE SOD DR 20 MG T: 20 | 30 days supply | Qty: 60 | Fill #2

## 2015-09-25 NOTE — Telephone Encounter (Signed)
Patient was called and I tried to inform the patient of why she was taken of the HCTZ medication. Patient states that this is her heart medication and she needs it. please follow up

## 2015-09-25 NOTE — Telephone Encounter (Signed)
Patient called wanting to know why she was taken off of hydrochlorothiazide. Please follow up

## 2015-10-09 ENCOUNTER — Encounter: Payer: Self-pay | Admitting: Family Medicine

## 2015-10-09 ENCOUNTER — Ambulatory Visit: Payer: Self-pay | Attending: Family Medicine | Admitting: Family Medicine

## 2015-10-09 VITALS — BP 107/74 | HR 92 | Temp 98.7°F | Ht 62.0 in | Wt 184.2 lb

## 2015-10-09 DIAGNOSIS — M109 Gout, unspecified: Secondary | ICD-10-CM | POA: Insufficient documentation

## 2015-10-09 DIAGNOSIS — M7661 Achilles tendinitis, right leg: Secondary | ICD-10-CM | POA: Insufficient documentation

## 2015-10-09 DIAGNOSIS — K219 Gastro-esophageal reflux disease without esophagitis: Secondary | ICD-10-CM | POA: Insufficient documentation

## 2015-10-09 DIAGNOSIS — I10 Essential (primary) hypertension: Secondary | ICD-10-CM

## 2015-10-09 DIAGNOSIS — I509 Heart failure, unspecified: Secondary | ICD-10-CM | POA: Insufficient documentation

## 2015-10-09 DIAGNOSIS — E790 Hyperuricemia without signs of inflammatory arthritis and tophaceous disease: Secondary | ICD-10-CM

## 2015-10-09 DIAGNOSIS — I110 Hypertensive heart disease with heart failure: Secondary | ICD-10-CM | POA: Insufficient documentation

## 2015-10-09 DIAGNOSIS — M1A4720 Other secondary chronic gout, left ankle and foot, without tophus (tophi): Secondary | ICD-10-CM

## 2015-10-09 MED ORDER — ALLOPURINOL 100 MG PO TABS: 200.0000 mg | ORAL_TABLET | Freq: Every day | ORAL | 2 refills | Status: DC

## 2015-10-09 MED ORDER — DICLOFENAC SODIUM 75 MG PO TBEC: 75.0000 mg | DELAYED_RELEASE_TABLET | Freq: Two times a day (BID) | ORAL | 0 refills | Status: DC | PRN

## 2015-10-09 MED ORDER — CARVEDILOL 6.25 MG PO TABS: 6.2500 mg | ORAL_TABLET | Freq: Two times a day (BID) | ORAL | 3 refills | Status: DC

## 2015-10-09 MED ORDER — PREDNISONE 20 MG PO TABS: ORAL_TABLET | ORAL | 0 refills | Status: DC

## 2015-10-09 MED ORDER — PANTOPRAZOLE SODIUM 20 MG PO TBEC: DELAYED_RELEASE_TABLET | ORAL | 3 refills | Status: DC

## 2015-10-09 MED ORDER — AMLODIPINE BESYLATE 10 MG PO TABS: 10.0000 mg | ORAL_TABLET | Freq: Every day | ORAL | 3 refills | Status: DC

## 2015-10-09 MED FILL — predniSONE 20 MG TABS: 20 | 6 days supply | Qty: 12 | Fill #0

## 2015-10-09 MED FILL — ?DICLOFENAC SOD DR 75 MG TA: 75 | 30 days supply | Qty: 60 | Fill #0

## 2015-10-09 NOTE — Assessment & Plan Note (Signed)
Gout in L ankle Stop HCTZ Short course of prednisone for swelling Continue allopurinol

## 2015-10-09 NOTE — Assessment & Plan Note (Signed)
A: well controlled P: Stop HCTZ due to gout Continue amlodipine and coreg

## 2015-10-09 NOTE — Telephone Encounter (Signed)
Patient has OV today, But please inform patient that HCTZ was stopped in April 2017 due to gout. HCTZ is for her BP. She now take amlodipine for her BP.  Her medication for her CHF (her heart) is coreg and she remains on coreg.   Her heart is protected with coreg. The other thing she should do to protect her heart is slowly cut down on alcohol to very little or none. Since too much alcohol is a common cause of congestive heart failure

## 2015-10-09 NOTE — Patient Instructions (Addendum)
Christina Byrd was seen today for follow-up.  Diagnoses and all orders for this visit:  Gastroesophageal reflux disease, esophagitis presence not specified -     pantoprazole (PROTONIX) 20 MG tablet; TAKE 1 TABLET BY MOUTH 2 TIMES DAILY BEFORE A MEAL  Hyperuricemia -     allopurinol (ZYLOPRIM) 100 MG tablet; Take 2 tablets (200 mg total) by mouth daily.  HYPERTENSION, BENIGN ESSENTIAL -     amLODipine (NORVASC) 10 MG tablet; Take 1 tablet (10 mg total) by mouth daily. -     carvedilol (COREG) 6.25 MG tablet; Take 1 tablet (6.25 mg total) by mouth 2 (two) times daily.  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 Armen Pickup

## 2015-10-09 NOTE — Progress Notes (Signed)
Subjective:  Patient ID: Christina Byrd, female    DOB: 03-Mar-1965  Age: 50 y.o. MRN: [id number]  CC: Follow-up (swelling in left foot)   HPI Christina Byrd has hx of HTN, CHF, gout presents for   1. Gout: she has improved pain in L ankle since she cut down on alcohol.  She no longer has to walk on crutches. She still has swelling in her L ankle and dorsal foot. She also has R Achilles pain. She is drinking a shot of white liquor nightly and wine cooler every now and then. No beer. She is taking allopurinol 200 mg daily. She erroneously continues to take  HCTZ as the pharmacy was still giving her refills.   2. HTN with CHF: taking coreg, amlodipine, HCTZ. No CP or SOB. Fatigue a times. No swelling other than L foot and ankle.   HM: refused flu shot today. She will get it at next OV   Social History  Substance Use Topics  . Smoking status: Former Smoker    Quit date: 06/25/2012  . Smokeless tobacco: Not on file  . Alcohol use 1.5 oz/week    3 Standard drinks or equivalent per week     Comment: ADMITS TO DRINKING 3-4 BEERS/DAY    Outpatient Medications Prior to Visit  Medication Sig Dispense Refill  . allopurinol (ZYLOPRIM) 100 MG tablet Take 2 tablets (200 mg total) by mouth daily. 60 tablet 6  . amLODipine (NORVASC) 10 MG tablet TAKE 1 TABLET BY MOUTH DAILY 30 tablet 2  . aspirin EC 81 MG tablet Take 1 tablet (81 mg total) by mouth daily.    . carvedilol (COREG) 6.25 MG tablet TAKE 1 TABLET BY MOUTH TWICE DAILY. 60 tablet 2  . diclofenac (VOLTAREN) 75 MG EC tablet Take 1 tablet (75 mg total) by mouth 2 (two) times daily as needed for moderate pain. (Patient not taking: Reported on 10/09/2015) 60 tablet 0  . pantoprazole (PROTONIX) 20 MG tablet TAKE 1 TABLET BY MOUTH 2 TIMES DAILY BEFORE A MEAL (Patient not taking: Reported on 10/09/2015) 60 tablet 2   No facility-administered medications prior to visit.     ROS Review of Systems  Constitutional: Negative for chills and  fever.  Eyes: Negative for visual disturbance.  Respiratory: Negative for shortness of breath.   Cardiovascular: Negative for chest pain.  Gastrointestinal: Negative for abdominal pain and blood in stool.  Musculoskeletal: Positive for arthralgias, gait problem and joint swelling. Negative for back pain.  Skin: Negative for rash.  Allergic/Immunologic: Negative for immunocompromised state.  Hematological: Negative for adenopathy. Does not bruise/bleed easily.  Psychiatric/Behavioral: Negative for dysphoric mood and suicidal ideas.    Objective:  BP 107/74 (BP Location: Right Arm, Patient Position: Sitting, Cuff Size: Large)   Pulse 92   Temp 98.7 F (37.1 C) (Oral)   Ht 5\' 2"  (1.575 m)   Wt 184 lb 3.2 oz (83.6 kg)   SpO2 100%   BMI 33.69 kg/m   BP/Weight 10/09/2015 09/02/2015 07/28/2015  Systolic BP 107 120 117  Diastolic BP 74 76 80  Wt. (Lbs) 184.2 182 178.2  BMI 33.69 33.28 32.58   Physical Exam  Constitutional: She is oriented to person, place, and time. She appears well-developed and well-nourished. No distress.  HENT:  Head: Normocephalic and atraumatic.  Cardiovascular: Normal rate, regular rhythm, normal heart sounds and intact distal pulses.   Pulmonary/Chest: Effort normal and breath sounds normal.  Musculoskeletal: She exhibits no edema.  Neurological: She is  alert and oriented to person, place, and time.  Skin: Skin is warm and dry. No rash noted.  Psychiatric: She has a normal mood and affect.   Lab Results  Component Value Date   LABURIC 8.2 (H) 09/02/2015     Assessment & Plan:  Christina Byrd was seen today for follow-up.  Diagnoses and all orders for this visit:  Gastroesophageal reflux disease, esophagitis presence not specified -     pantoprazole (PROTONIX) 20 MG tablet; TAKE 1 TABLET BY MOUTH 2 TIMES DAILY BEFORE A MEAL  Hyperuricemia -     allopurinol (ZYLOPRIM) 100 MG tablet; Take 2 tablets (200 mg total) by mouth daily.  HYPERTENSION, BENIGN  ESSENTIAL -     amLODipine (NORVASC) 10 MG tablet; Take 1 tablet (10 mg total) by mouth daily. -     carvedilol (COREG) 6.25 MG tablet; Take 1 tablet (6.25 mg total) by mouth 2 (two) times daily.  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  Tendonitis, Achilles, right -     predniSONE (DELTASONE) 20 MG tablet; Take every morning with food 60 mg daily for 2 days, 40 mg daily for 2  days, 20 mg daily for 2 days then STOP -     diclofenac (VOLTAREN) 75 MG EC tablet; Take 1 tablet (75 mg total) by mouth 2 (two) times daily as needed for moderate pain.   There are no diagnoses linked to this encounter.  No orders of the defined types were placed in this encounter.   Follow-up: Return in about 4 weeks (around 11/06/2015) for uric acid check and flu shot .   Dessa Phi MD

## 2015-10-22 MED FILL — ?ALLOPURINOL 100MG TABLET: 100 | 30 days supply | Qty: 30 | Fill #4

## 2015-10-22 MED FILL — AMLODIPINE BESYLATE 10 MG T: 10 | 30 days supply | Qty: 30 | Fill #3

## 2015-10-22 MED FILL — PANTOPRAZOLE SOD DR 20 MG T: 20 | 30 days supply | Qty: 60 | Fill #0

## 2015-10-24 MED FILL — CARVEDILOL 6.25 MG TABLET: 6.25 | 30 days supply | Qty: 60 | Fill #2

## 2015-11-13 NOTE — Progress Notes (Signed)
Primary PCP: [facility]  HPI: Ms. Sendelbach is a 50 y.o.  patient with  a history of HTN, tobacco abuse, GERD and chronic systolic HF.   She was admitted to the hospital in 08/2012 with increased SOB and was thought to have PNA. She was treated with antibiotics and was sent home. She continued to have SOB and ECHO 09/2012 showed EF 20-25% with moderate MR.  Was seen by me on 01/26/13 and was given samples of benicar/HCTZ 40/12.5 mg and was scheduled for [facility] in January 2015. But had no insurance and never done . Was on Cozaar however stopped d/t facial pruritis. Developed cough with ACE-I.   She returns for follow up. 03/2014 started on coreg  6.25 mg bid. She stopped taking hydralazine and Imdur due to headaches. Complains of increased cough and fatigue.  . She has not been taking hydralazine/Imdur due to headache. Not weighing at home.   ECHO 03/15/13 EF 45-50%   SH: Quit smoking [id number] . Drinks a beer a night and occasional shots of liquor. 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   ROS: All systems negative except as listed in HPI, PMH and Problem List.  Past Medical History:  Diagnosis Date  . GASTROESOPHAGEAL REFLUX, NO ESOPHAGITIS 04/21/2006   Qualifier: Diagnosis of  By: Levada Schilling    . GERD (gastroesophageal reflux disease) Dx 1995    Current Outpatient Prescriptions  Medication Sig Dispense Refill  . acetaminophen-codeine (TYLENOL #3) 300-30 MG tablet Take 1 tablet by mouth every 8 (eight) hours as needed for moderate pain.    Marland Kitchen allopurinol (ZYLOPRIM) 100 MG tablet Take 2 tablets (200 mg total) by mouth daily. 180 tablet 2  . amLODipine (NORVASC) 10 MG tablet Take 1 tablet (10 mg total) by mouth daily. 90 tablet 3  . aspirin EC 81 MG tablet Take 1 tablet (81 mg total) by mouth daily.    . carvedilol (COREG) 6.25 MG tablet Take 1 tablet (6.25 mg total) by mouth 2 (two) times daily. 180 tablet 3  . cetirizine (ZYRTEC) 10 MG tablet Take 10 mg by mouth daily.    .  diclofenac (VOLTAREN) 75 MG EC tablet Take 1 tablet (75 mg total) by mouth 2 (two) times daily as needed for moderate pain. 60 tablet 0  . pantoprazole (PROTONIX) 20 MG tablet TAKE 1 TABLET BY MOUTH 2 TIMES DAILY BEFORE A MEAL 180 tablet 3  . predniSONE (DELTASONE) 20 MG tablet Take every morning with food 60 mg daily for 2 days, 40 mg daily for 2  days, 20 mg daily for 2 days then STOP 12 tablet 0   No current facility-administered medications for this visit.     Vitals:   11/20/15 1551  BP: 130/80  Pulse: 93  SpO2: 98%  Weight: 83.4 kg (183 lb 12.8 oz)  Height: 5\' 2"  (1.575 m)    PHYSICAL EXAM: General:  Well appearing. No resp difficulty HEENT: normal Neck: supple. JVP 5-6  Carotids 2+ bilaterally; no bruits. No lymphadenopathy or thryomegaly appreciated. Cor: PMI normal. Regular rate & rhythm. No rubs, gallops or murmurs. Lungs: clear Abdomen: soft, nontender, nondistended. No hepatosplenomegaly. No bruits or masses. Good bowel sounds. Extremities: no cyanosis, clubbing, rash, edema Neuro: alert & orientedx3, cranial nerves grossly intact. Moves all 4 extremities w/o difficulty. Affect pleasant.  ECG:  11/30/12  SR rate 74 LVH  No  Old MI   2.25/16  SR rate 91 normal  11/20/15  SR rate 88 normal ECG  ASSESSMENT & PLAN:  1) Chronic systolic HF: Previous  EF 20-25% (09/2012) Etiology of HF unclear, possibly related to long standing HTN.  Last echo EF improved to 40-45% 03/15/13  Will continue HCTX 25 mg daily. Continue 6.25 mg carvedilol twice a day.  F/U echo 02/2016   Off hydralazine/Imdur due to headache Not on an ace due to cough.  Not on losartan due to rash.   Reinforced low salt food choices, limiting fluid intake to < 2 liters per day, and medication compliance.   2) HTN- Improved  Hydralazine/Imdur stopped due to headache. Continue current dose of carvedilol and HCTZ. Improved with amlodipine   F/U with me in a year Check  echo in January will be 2 years   Charlton HawsPeter  Edelmira Gallogly

## 2015-11-19 MED FILL — PANTOPRAZOLE SOD DR 20 MG T: 20 | 30 days supply | Qty: 60 | Fill #1

## 2015-11-19 MED FILL — ?ALLOPURINOL 100MG TABLET: 100 | 30 days supply | Qty: 30 | Fill #5

## 2015-11-19 MED FILL — CARVEDILOL 6.25 MG TABLET: 6.25 | 30 days supply | Qty: 60 | Fill #0

## 2015-11-19 MED FILL — AMLODIPINE BESYLATE 10 MG T: 10 | 30 days supply | Qty: 30 | Fill #0

## 2015-11-20 ENCOUNTER — Encounter (INDEPENDENT_AMBULATORY_CARE_PROVIDER_SITE_OTHER): Payer: Self-pay

## 2015-11-20 ENCOUNTER — Ambulatory Visit (INDEPENDENT_AMBULATORY_CARE_PROVIDER_SITE_OTHER): Payer: No Typology Code available for payment source | Admitting: Cardiovascular Disease

## 2015-11-20 ENCOUNTER — Encounter: Payer: Self-pay | Admitting: Cardiovascular Disease

## 2015-11-20 VITALS — BP 130/80 | HR 93 | Ht 62.0 in | Wt 183.8 lb

## 2015-11-20 DIAGNOSIS — I5022 Chronic systolic (congestive) heart failure: Secondary | ICD-10-CM

## 2015-11-20 NOTE — Patient Instructions (Addendum)
Medication Instructions:  Your physician recommends that you continue on your current medications as directed. Please refer to the Current Medication list given to you today.  Labwork: NONE  Testing/Procedures: Your physician has requested that you have an echocardiogram in January. Echocardiography is a painless test that uses sound waves to create images of your heart. It provides your doctor with information about the size and shape of your heart and how well your heart's chambers and valves are working. This procedure takes approximately one hour. There are no restrictions for this procedure.  Follow-Up: Your physician wants you to follow-up in: 12 months with Dr. Eden Emms. You will receive a reminder letter in the mail two months in advance. If you don't receive a letter, please call our office to schedule the follow-up appointment.   If you need a refill on your cardiac medications before your next appointment, please call your pharmacy.

## 2015-12-04 ENCOUNTER — Encounter: Payer: Self-pay | Admitting: Family Medicine

## 2015-12-04 ENCOUNTER — Ambulatory Visit: Payer: Self-pay | Attending: Family Medicine | Admitting: Family Medicine

## 2015-12-04 VITALS — BP 125/87 | HR 94 | Temp 98.4°F | Ht 62.0 in | Wt 186.6 lb

## 2015-12-04 DIAGNOSIS — R0602 Shortness of breath: Secondary | ICD-10-CM | POA: Insufficient documentation

## 2015-12-04 DIAGNOSIS — E790 Hyperuricemia without signs of inflammatory arthritis and tophaceous disease: Secondary | ICD-10-CM

## 2015-12-04 DIAGNOSIS — I5022 Chronic systolic (congestive) heart failure: Secondary | ICD-10-CM

## 2015-12-04 DIAGNOSIS — Z79899 Other long term (current) drug therapy: Secondary | ICD-10-CM | POA: Insufficient documentation

## 2015-12-04 DIAGNOSIS — M109 Gout, unspecified: Secondary | ICD-10-CM | POA: Insufficient documentation

## 2015-12-04 DIAGNOSIS — Z87891 Personal history of nicotine dependence: Secondary | ICD-10-CM | POA: Insufficient documentation

## 2015-12-04 DIAGNOSIS — Z23 Encounter for immunization: Secondary | ICD-10-CM

## 2015-12-04 DIAGNOSIS — Z7982 Long term (current) use of aspirin: Secondary | ICD-10-CM | POA: Insufficient documentation

## 2015-12-04 MED ORDER — DICLOFENAC SODIUM 75 MG PO TBEC: 75.0000 mg | DELAYED_RELEASE_TABLET | Freq: Two times a day (BID) | ORAL | 2 refills | Status: DC | PRN

## 2015-12-04 MED ORDER — ACETAMINOPHEN-CODEINE #3 300-30 MG PO TABS
1.0000 | ORAL_TABLET | Freq: Three times a day (TID) | ORAL | 0 refills | Status: DC | PRN
Start: 2015-12-04 — End: 2016-06-22

## 2015-12-04 MED ORDER — ALLOPURINOL 100 MG PO TABS: 200.0000 mg | ORAL_TABLET | Freq: Every day | ORAL | 11 refills | Status: DC

## 2015-12-04 NOTE — Progress Notes (Signed)
Pt says ankle is no longer hurting, pt just wants to ger her flu shot.

## 2015-12-04 NOTE — Patient Instructions (Addendum)
Christina Byrd was seen today for follow-up.  Diagnoses and all orders for this visit:  Needs flu shot -     Flu Vaccine QUAD 36+ mos IM  Hyperuricemia -     allopurinol (ZYLOPRIM) 100 MG tablet; Take 2 tablets (200 mg total) by mouth daily. -     diclofenac (VOLTAREN) 75 MG EC tablet; Take 1 tablet (75 mg total) by mouth 2 (two) times daily as needed for moderate pain. -     acetaminophen-codeine (TYLENOL #3) 300-30 MG tablet; Take 1 tablet by mouth every 8 (eight) hours as needed for moderate pain.   F/u in 3 months for pap smear   Dr. Armen Pickup

## 2015-12-04 NOTE — Progress Notes (Signed)
Subjective:  Patient ID: Christina Byrd, female    DOB: Sep 13, 1965  Age: 50 y.o. MRN: [id number]  CC: Follow-up   HPI Christina Byrd presents for   1. Gout: doing well. No pain or swelling in ankles. Has stopped drinking liquor. Drinks per every night usually 1 beer.   2. CHF: has chronic SOB with exertion this is stable. No CP. No leg swelling. No dizziness or lightheadedness.   Social History  Substance Use Topics  . Smoking status: Former Smoker    Quit date: 06/25/2012  . Smokeless tobacco: Never Used  . Alcohol use 1.5 oz/week    3 Standard drinks or equivalent per week     Comment: ADMITS TO DRINKING 3-4 BEERS/DAY    Outpatient Medications Prior to Visit  Medication Sig Dispense Refill  . acetaminophen-codeine (TYLENOL #3) 300-30 MG tablet Take 1 tablet by mouth every 8 (eight) hours as needed for moderate pain.    Marland Kitchen. amLODipine (NORVASC) 10 MG tablet Take 1 tablet (10 mg total) by mouth daily. 90 tablet 3  . aspirin EC 81 MG tablet Take 1 tablet (81 mg total) by mouth daily.    . carvedilol (COREG) 6.25 MG tablet Take 1 tablet (6.25 mg total) by mouth 2 (two) times daily. 180 tablet 3  . cetirizine (ZYRTEC) 10 MG tablet Take 10 mg by mouth daily.    . pantoprazole (PROTONIX) 20 MG tablet TAKE 1 TABLET BY MOUTH 2 TIMES DAILY BEFORE A MEAL 180 tablet 3  . allopurinol (ZYLOPRIM) 100 MG tablet Take 2 tablets (200 mg total) by mouth daily. 180 tablet 2  . diclofenac (VOLTAREN) 75 MG EC tablet Take 1 tablet (75 mg total) by mouth 2 (two) times daily as needed for moderate pain. (Patient not taking: Reported on 12/04/2015) 60 tablet 0  . predniSONE (DELTASONE) 20 MG tablet Take every morning with food 60 mg daily for 2 days, 40 mg daily for 2  days, 20 mg daily for 2 days then STOP (Patient not taking: Reported on 12/04/2015) 12 tablet 0   No facility-administered medications prior to visit.     ROS Review of Systems  Constitutional: Negative for chills and fever.    Eyes: Negative for visual disturbance.  Respiratory: Positive for shortness of breath.   Cardiovascular: Negative for chest pain.  Gastrointestinal: Negative for abdominal pain and blood in stool.  Musculoskeletal: Negative for arthralgias and back pain.  Skin: Negative for rash.  Allergic/Immunologic: Negative for immunocompromised state.  Hematological: Negative for adenopathy. Does not bruise/bleed easily.  Psychiatric/Behavioral: Negative for dysphoric mood and suicidal ideas.    Objective:  BP 125/87 (BP Location: Right Arm, Patient Position: Sitting, Cuff Size: Small)   Pulse 94   Temp 98.4 F (36.9 C) (Oral)   Ht 5\' 2"  (1.575 m)   Wt 186 lb 9.6 oz (84.6 kg)   SpO2 100%   BMI 34.13 kg/m   BP/Weight 12/04/2015 11/20/2015 10/09/2015  Systolic BP 125 130 107  Diastolic BP 87 80 74  Wt. (Lbs) 186.6 183.8 184.2  BMI 34.13 33.62 33.69    Physical Exam  Constitutional: She is oriented to person, place, and time. She appears well-developed and well-nourished. No distress.  HENT:  Head: Normocephalic and atraumatic.  Cardiovascular: Normal rate, regular rhythm, normal heart sounds and intact distal pulses.   Pulmonary/Chest: Effort normal and breath sounds normal.  Musculoskeletal: She exhibits no edema.  Neurological: She is alert and oriented to person, place, and time.  Skin:  Skin is warm and dry. No rash noted.  Psychiatric: She has a normal mood and affect.   Lab Results  Component Value Date   HGBA1C 6.1 04/25/2015     Assessment & Plan:   Nithya was seen today for follow-up.  Diagnoses and all orders for this visit:  Needs flu shot -     Flu Vaccine QUAD 36+ mos IM  Hyperuricemia -     allopurinol (ZYLOPRIM) 100 MG tablet; Take 2 tablets (200 mg total) by mouth daily. -     diclofenac (VOLTAREN) 75 MG EC tablet; Take 1 tablet (75 mg total) by mouth 2 (two) times daily as needed for moderate pain. -     acetaminophen-codeine (TYLENOL #3) 300-30 MG  tablet; Take 1 tablet by mouth every 8 (eight) hours as needed for moderate pain.   Meds ordered this encounter  Medications  . allopurinol (ZYLOPRIM) 100 MG tablet    Sig: Take 2 tablets (200 mg total) by mouth daily.    Dispense:  60 tablet    Refill:  11    Follow-up: Return in about 3 months (around 03/05/2016) for pap smear .   Dessa Phi MD

## 2015-12-05 MED FILL — ?DICLOFENAC SOD DR 75 MG TA: 75 | 30 days supply | Qty: 60 | Fill #0

## 2015-12-08 MED FILL — ACETAMINOPHEN/COD #3 TABLET: 300-30 | 20 days supply | Qty: 60 | Fill #0

## 2015-12-09 NOTE — Assessment & Plan Note (Signed)
Improved No joint pain Continue allopurinol voltaren and tylenol #3 as needed for flare/joint pain

## 2015-12-09 NOTE — Assessment & Plan Note (Signed)
Chronic and compensated  Patient to continue cardiology follow up

## 2015-12-22 MED FILL — PANTOPRAZOLE SOD DR 20 MG T: 20 | 30 days supply | Qty: 60 | Fill #2

## 2015-12-22 MED FILL — CARVEDILOL 6.25 MG TABLET: 6.25 | 30 days supply | Qty: 60 | Fill #1

## 2015-12-22 MED FILL — ?AMLODIPINE BESYLATE 10 MG: 10 | 30 days supply | Qty: 30 | Fill #1

## 2015-12-22 MED FILL — ALLOPURINOL 100 MG TABLET: 100 | 30 days supply | Qty: 30 | Fill #6

## 2016-01-19 MED FILL — ?AMLODIPINE BESYLATE 10 MG: 10 | 30 days supply | Qty: 30 | Fill #2

## 2016-01-19 MED FILL — CARVEDILOL 6.25 MG TABLET: 6.25 | 30 days supply | Qty: 60 | Fill #2

## 2016-01-19 MED FILL — ALLOPURINOL 100 MG TABLET: 100 | 30 days supply | Qty: 60 | Fill #0

## 2016-01-19 MED FILL — PANTOPRAZOLE SOD DR 20 MG T: 20 | 30 days supply | Qty: 60 | Fill #3

## 2016-01-19 MED FILL — ?DICLOFENAC SOD DR 75 MG TA: 75 | 30 days supply | Qty: 60 | Fill #1

## 2016-02-20 MED FILL — ?AMLODIPINE BESYLATE 10 MG: 10 | 30 days supply | Qty: 30 | Fill #3

## 2016-03-08 ENCOUNTER — Other Ambulatory Visit: Payer: Self-pay

## 2016-03-08 ENCOUNTER — Ambulatory Visit (HOSPITAL_COMMUNITY): Payer: Self-pay | Attending: Cardiovascular Disease

## 2016-03-08 DIAGNOSIS — I5022 Chronic systolic (congestive) heart failure: Secondary | ICD-10-CM | POA: Insufficient documentation

## 2016-03-09 ENCOUNTER — Telehealth: Payer: Self-pay | Admitting: Cardiovascular Disease

## 2016-03-09 NOTE — Telephone Encounter (Signed)
New Message  ° ° ° °Returning your call please call  °

## 2016-03-09 NOTE — Telephone Encounter (Signed)
Called patient back with echocardiogram results.

## 2016-03-17 ENCOUNTER — Ambulatory Visit: Payer: Self-pay | Attending: Family Medicine

## 2016-03-18 MED FILL — PANTOPRAZOLE SOD DR 20 MG T: 20 | 30 days supply | Qty: 60 | Fill #4

## 2016-03-18 MED FILL — ALLOPURINOL 100 MG TABLET: 100 | 30 days supply | Qty: 60 | Fill #1

## 2016-03-18 MED FILL — AMLODIPINE BESYLATE 10 MG T: 10 | 30 days supply | Qty: 30 | Fill #4

## 2016-03-19 MED FILL — CARVEDILOL 6.25 MG TABLET: 6.25 | 30 days supply | Qty: 60 | Fill #3

## 2016-04-22 MED FILL — PANTOPRAZOLE SOD DR 20 MG T: 20 | 30 days supply | Qty: 60 | Fill #5

## 2016-04-22 MED FILL — CARVEDILOL 6.25 MG TABLET: 6.25 | 30 days supply | Qty: 60 | Fill #4

## 2016-04-22 MED FILL — AMLODIPINE BESYLATE 10 MG T: 10 | 30 days supply | Qty: 30 | Fill #5

## 2016-04-22 MED FILL — ALLOPURINOL 100 MG TABLET: 100 | 30 days supply | Qty: 60 | Fill #2

## 2016-05-11 ENCOUNTER — Encounter: Payer: Self-pay | Admitting: Family Medicine

## 2016-05-11 ENCOUNTER — Ambulatory Visit: Payer: Self-pay | Attending: Family Medicine | Admitting: Family Medicine

## 2016-05-11 VITALS — BP 139/94 | HR 96 | Temp 98.3°F | Ht 62.0 in | Wt 195.4 lb

## 2016-05-11 DIAGNOSIS — Z79899 Other long term (current) drug therapy: Secondary | ICD-10-CM | POA: Insufficient documentation

## 2016-05-11 DIAGNOSIS — Z87891 Personal history of nicotine dependence: Secondary | ICD-10-CM | POA: Insufficient documentation

## 2016-05-11 DIAGNOSIS — J029 Acute pharyngitis, unspecified: Secondary | ICD-10-CM | POA: Insufficient documentation

## 2016-05-11 DIAGNOSIS — Z7982 Long term (current) use of aspirin: Secondary | ICD-10-CM | POA: Insufficient documentation

## 2016-05-11 DIAGNOSIS — J988 Other specified respiratory disorders: Secondary | ICD-10-CM

## 2016-05-11 DIAGNOSIS — R05 Cough: Secondary | ICD-10-CM | POA: Insufficient documentation

## 2016-05-11 LAB — POCT RAPID STREP A (OFFICE): Rapid Strep A Screen: NEGATIVE

## 2016-05-11 MED ORDER — BENZONATATE 100 MG PO CAPS: 100.0000 mg | ORAL_CAPSULE | Freq: Three times a day (TID) | ORAL | 0 refills | Status: DC | PRN

## 2016-05-11 MED ORDER — AZITHROMYCIN 250 MG PO TABS: ORAL_TABLET | ORAL | 0 refills | Status: DC

## 2016-05-11 MED FILL — AZITHROMYCIN 250 MG TABLET: 250 | 5 days supply | Qty: 6 | Fill #0

## 2016-05-11 MED FILL — BENZONATATE 100 MG CAPSULE: 100 | 6 days supply | Qty: 20 | Fill #0

## 2016-05-11 NOTE — Progress Notes (Signed)
Subjective:  Patient ID: Christina Byrd, female    DOB: 08/06/1965  Age: 51 y.o. MRN: [id number]  CC: Cough and Sore Throat   HPI Christina Byrd presents for   1. Sore throat and cough: symptoms started 2 days ago. Cough is productive of green sputum. Cough is worse at night. No chest pain or SOB. No fever. She reports her grandchildren have been sick. No headache or pain in face. No ear pain. Ear itches.   Social History  Substance Use Topics  . Smoking status: Former Smoker    Quit date: 06/25/2012  . Smokeless tobacco: Never Used  . Alcohol use 1.5 oz/week    3 Standard drinks or equivalent per week     Comment: ADMITS TO DRINKING 3-4 BEERS/DAY    Outpatient Medications Prior to Visit  Medication Sig Dispense Refill  . acetaminophen-codeine (TYLENOL #3) 300-30 MG tablet Take 1 tablet by mouth every 8 (eight) hours as needed for moderate pain. 60 tablet 0  . allopurinol (ZYLOPRIM) 100 MG tablet Take 2 tablets (200 mg total) by mouth daily. 60 tablet 11  . amLODipine (NORVASC) 10 MG tablet Take 1 tablet (10 mg total) by mouth daily. 90 tablet 3  . aspirin EC 81 MG tablet Take 1 tablet (81 mg total) by mouth daily.    . carvedilol (COREG) 6.25 MG tablet Take 1 tablet (6.25 mg total) by mouth 2 (two) times daily. 180 tablet 3  . cetirizine (ZYRTEC) 10 MG tablet Take 10 mg by mouth daily.    . diclofenac (VOLTAREN) 75 MG EC tablet Take 1 tablet (75 mg total) by mouth 2 (two) times daily as needed for moderate pain. 60 tablet 2  . pantoprazole (PROTONIX) 20 MG tablet TAKE 1 TABLET BY MOUTH 2 TIMES DAILY BEFORE A MEAL 180 tablet 3   No facility-administered medications prior to visit.     ROS Review of Systems  Constitutional: Negative for chills and fever.  HENT: Positive for sore throat.   Eyes: Negative for visual disturbance.  Respiratory: Positive for cough. Negative for shortness of breath.   Cardiovascular: Negative for chest pain.  Gastrointestinal: Negative for  abdominal pain and blood in stool.  Musculoskeletal: Negative for arthralgias and back pain.  Skin: Negative for rash.  Allergic/Immunologic: Negative for immunocompromised state.  Hematological: Negative for adenopathy. Does not bruise/bleed easily.  Psychiatric/Behavioral: Negative for dysphoric mood and suicidal ideas.    Objective:  BP (!) 139/94   Pulse 96   Temp 98.3 F (36.8 C) (Oral)   Ht 5\' 2"  (1.575 m)   Wt 195 lb 6.4 oz (88.6 kg)   SpO2 98%   BMI 35.74 kg/m   BP/Weight 05/11/2016 12/04/2015 11/20/2015  Systolic BP 139 125 130  Diastolic BP 94 87 80  Wt. (Lbs) 195.4 186.6 183.8  BMI 35.74 34.13 33.62      Physical Exam  Constitutional: She is oriented to person, place, and time. She appears well-developed and well-nourished. No distress.  HENT:  Head: Normocephalic and atraumatic.  Right Ear: Tympanic membrane, external ear and ear canal normal.  Left Ear: Tympanic membrane and external ear normal.  Nose: Mucosal edema present.  Mouth/Throat: Oropharynx is clear and moist.  Eyes: Conjunctivae and EOM are normal. Pupils are equal, round, and reactive to light.  Neck: Normal range of motion. Neck supple.  Cardiovascular: Normal rate, regular rhythm, normal heart sounds and intact distal pulses.   Pulmonary/Chest: Effort normal and breath sounds normal.  Musculoskeletal: She  exhibits no edema.  Lymphadenopathy:    She has no cervical adenopathy.  Neurological: She is alert and oriented to person, place, and time.  Skin: Skin is warm and dry. No rash noted.  Psychiatric: She has a normal mood and affect.   Rapid strep negative  Assessment & Plan:   Christina Byrd was seen today for cough and sore throat.  Diagnoses and all orders for this visit:  Sore throat -     POCT rapid strep A  Respiratory infection -     benzonatate (TESSALON PERLES) 100 MG capsule; Take 1 capsule (100 mg total) by mouth 3 (three) times daily as needed for cough. -     azithromycin  (ZITHROMAX Z-PAK) 250 MG tablet; Take 500 mg by mouth day 1, then 250 mg daily for 4 more days     No orders of the defined types were placed in this encounter.   Follow-up: Return in about 6 weeks (around 06/22/2016) for wellness physical.   Dessa Phi MD

## 2016-05-11 NOTE — Patient Instructions (Addendum)
Christina Byrd was seen today for cough and sore throat.  Diagnoses and all orders for this visit:  Sore throat -     POCT rapid strep A  Respiratory infection -     benzonatate (TESSALON PERLES) 100 MG capsule; Take 1 capsule (100 mg total) by mouth 3 (three) times daily as needed for cough. -     azithromycin (ZITHROMAX Z-PAK) 250 MG tablet; Take 500 mg by mouth day 1, then 250 mg daily for 4 more days   Your rapid strep is negative Take tessalon perles for cough Wait for up to 7 days to see if your body clears the virus if you are not feeling better start azithromycin  Please f/u in 6 weeks for wellness physical with pap (also due for mammogram and colonoscopy)   Dr. Armen Pickup

## 2016-05-11 NOTE — Assessment & Plan Note (Addendum)
Suspect viral respiratory infection with symptoms x one week only Plan: Tessalon perles z-pac prn failure to improve

## 2016-05-11 NOTE — Progress Notes (Signed)
Pt is here for a sore throat and cough.

## 2016-05-24 MED FILL — AMLODIPINE BESYLATE 10 MG T: 10 | 30 days supply | Qty: 30 | Fill #6

## 2016-05-24 MED FILL — ALLOPURINOL 100 MG TABLET: 100 | 30 days supply | Qty: 60 | Fill #3

## 2016-05-24 MED FILL — ?CARVEDILOL 6.25 MG TABLET: 6.25 | 30 days supply | Qty: 60 | Fill #5

## 2016-06-21 MED FILL — AMLODIPINE BESYLATE 10 MG T: 10 | 30 days supply | Qty: 30 | Fill #7

## 2016-06-21 MED FILL — PANTOPRAZOLE SOD DR 20 MG T: 20 | 30 days supply | Qty: 60 | Fill #6

## 2016-06-21 MED FILL — ?DICLOFENAC SOD DR 75 MG TA: 75 | 30 days supply | Qty: 60 | Fill #2

## 2016-06-21 MED FILL — ALLOPURINOL 100 MG TABLET: 100 | 30 days supply | Qty: 60 | Fill #4

## 2016-06-21 MED FILL — ?CARVEDILOL 6.25 MG TABLET: 6.25 | 30 days supply | Qty: 60 | Fill #6

## 2016-06-22 ENCOUNTER — Encounter: Payer: Self-pay | Admitting: Family Medicine

## 2016-06-22 ENCOUNTER — Ambulatory Visit: Payer: Self-pay | Attending: Family Medicine | Admitting: Family Medicine

## 2016-06-22 VITALS — BP 128/83 | HR 98 | Temp 97.9°F | Ht 62.0 in | Wt 197.6 lb

## 2016-06-22 DIAGNOSIS — N951 Menopausal and female climacteric states: Secondary | ICD-10-CM | POA: Insufficient documentation

## 2016-06-22 DIAGNOSIS — Z886 Allergy status to analgesic agent status: Secondary | ICD-10-CM | POA: Insufficient documentation

## 2016-06-22 DIAGNOSIS — Z888 Allergy status to other drugs, medicaments and biological substances status: Secondary | ICD-10-CM | POA: Insufficient documentation

## 2016-06-22 DIAGNOSIS — Z79899 Other long term (current) drug therapy: Secondary | ICD-10-CM | POA: Insufficient documentation

## 2016-06-22 DIAGNOSIS — Z87891 Personal history of nicotine dependence: Secondary | ICD-10-CM | POA: Insufficient documentation

## 2016-06-22 DIAGNOSIS — Z0000 Encounter for general adult medical examination without abnormal findings: Secondary | ICD-10-CM | POA: Insufficient documentation

## 2016-06-22 DIAGNOSIS — Z881 Allergy status to other antibiotic agents status: Secondary | ICD-10-CM | POA: Insufficient documentation

## 2016-06-22 DIAGNOSIS — L304 Erythema intertrigo: Secondary | ICD-10-CM | POA: Insufficient documentation

## 2016-06-22 HISTORY — DX: Menopausal and female climacteric states: N95.1

## 2016-06-22 HISTORY — DX: Erythema intertrigo: L30.4

## 2016-06-22 MED ORDER — VENLAFAXINE HCL ER 37.5 MG PO CP24
ORAL_CAPSULE | ORAL | 2 refills | Status: DC
Start: 2016-06-22 — End: 2016-09-13

## 2016-06-22 MED ORDER — KETOCONAZOLE 2 % EX CREA
1.0000 | TOPICAL_CREAM | Freq: Two times a day (BID) | CUTANEOUS | 0 refills | Status: DC
Start: 2016-06-22 — End: 2017-01-19

## 2016-06-22 NOTE — Progress Notes (Signed)
SUBJECTIVE:  51 y.o. female for annual routine Pap and checkup.  She was scheduled for pap smear but refuses pap and pelvic today. She report that  Last pap 12/26/2000 normal cytology. She is sexually active with her husband rarely. She has one son.  She complains of hot flashes and rash under her breast. She complains of weight gain. She reports drinking 1-2 Corona per night.   Social History  Substance Use Topics  . Smoking status: Former Smoker    Quit date: 06/25/2012  . Smokeless tobacco: Never Used  . Alcohol use 1.5 oz/week    3 Standard drinks or equivalent per week     Comment: ADMITS TO DRINKING 3-4 BEERS/DAY   Current Outpatient Prescriptions  Medication Sig Dispense Refill  . amLODipine (NORVASC) 10 MG tablet Take 1 tablet (10 mg total) by mouth daily. 90 tablet 3  . carvedilol (COREG) 6.25 MG tablet Take 1 tablet (6.25 mg total) by mouth 2 (two) times daily. 180 tablet 3  . pantoprazole (PROTONIX) 20 MG tablet TAKE 1 TABLET BY MOUTH 2 TIMES DAILY BEFORE A MEAL 180 tablet 3  . acetaminophen-codeine (TYLENOL #3) 300-30 MG tablet Take 1 tablet by mouth every 8 (eight) hours as needed for moderate pain. (Patient not taking: Reported on 06/22/2016) 60 tablet 0  . allopurinol (ZYLOPRIM) 100 MG tablet Take 2 tablets (200 mg total) by mouth daily. (Patient not taking: Reported on 06/22/2016) 60 tablet 11  . aspirin EC 81 MG tablet Take 1 tablet (81 mg total) by mouth daily. (Patient not taking: Reported on 06/22/2016)    . azithromycin (ZITHROMAX Z-PAK) 250 MG tablet Take 500 mg by mouth day 1, then 250 mg daily for 4 more days (Patient not taking: Reported on 06/22/2016) 6 each 0  . benzonatate (TESSALON PERLES) 100 MG capsule Take 1 capsule (100 mg total) by mouth 3 (three) times daily as needed for cough. (Patient not taking: Reported on 06/22/2016) 20 capsule 0  . cetirizine (ZYRTEC) 10 MG tablet Take 10 mg by mouth daily.    . diclofenac (VOLTAREN) 75 MG EC tablet Take 1 tablet (75 mg  total) by mouth 2 (two) times daily as needed for moderate pain. (Patient not taking: Reported on 06/22/2016) 60 tablet 2   No current facility-administered medications for this visit.    Allergies: Losartan potassium; Levaquin [levofloxacin in d5w]; Ace inhibitors; Codeine; Cefepime; and Vancomycin  No LMP recorded. Patient is not currently having periods (Reason: Perimenopausal).  ROS:  Feeling well. No dyspnea or chest pain on exertion.  No abdominal pain, change in bowel habits, black or bloody stools.  No urinary tract symptoms. GYN ROS: no vaginal bleeding. No neurological complaints.  OBJECTIVE:  The patient appears well, alert, oriented x 3, in no distress. BP 128/83   Pulse 98   Temp 97.9 F (36.6 C) (Oral)   Ht 5\' 2"  (1.575 m)   Wt 197 lb 9.6 oz (89.6 kg)   SpO2 98%   BMI 36.14 kg/m  ENT normal.  Neck supple. No adenopathy or thyromegaly. PERLA. Lungs are clear, good air entry, no wheezes, rhonchi or rales. S1 and S2 normal, no murmurs, regular rate and rhythm. Abdomen soft without tenderness, guarding, mass or organomegaly. Extremities show no edema, normal peripheral pulses. Neurological is normal, no focal findings.  BREAST EXAM: hyperpigmented rash under both breath   PELVIC EXAM: exam declined by the patient  ASSESSMENT:  well woman  Christina Byrd was seen today for annual exam.  Diagnoses and  all orders for this visit:  Hot flash, menopausal -     venlafaxine XR (EFFEXOR XR) 37.5 MG 24 hr capsule; Take Effexor 37.5 mg daily for one week, then 75 mg daily  Intertrigo -     ketoconazole (NIZORAL) 2 % cream; Apply 1 application topically 2 (two) times daily. Under breast for 2-4 weeks

## 2016-06-22 NOTE — Patient Instructions (Addendum)
Christina Byrd was seen today for annual exam.  Diagnoses and all orders for this visit:  Hot flash, menopausal -     venlafaxine XR (EFFEXOR XR) 37.5 MG 24 hr capsule; Take Effexor 37.5 mg daily for one week, then 75 mg daily  Intertrigo -     ketoconazole (NIZORAL) 2 % cream; Apply 1 application topically 2 (two) times daily. Under breast for 2-4 weeks   Avoid hot flash triggers: alcohol, spicy foods, chocolate, caffeine.   f/u in 6 weeks for hot flashes  Dr. Armen Pickup  Menopause Menopause is the normal time of life when menstrual periods stop completely. Menopause is complete when you have missed 12 consecutive menstrual periods. It usually occurs between the ages of 48 years and 55 years. Very rarely does a woman develop menopause before the age of 40 years. At menopause, your ovaries stop producing the female hormones estrogen and progesterone. This can cause undesirable symptoms and also affect your health. Sometimes the symptoms may occur 4-5 years before the menopause begins. There is no relationship between menopause and:  Oral contraceptives.  Number of children you had.  Race.  The age your menstrual periods started (menarche). Heavy smokers and very thin women may develop menopause earlier in life. What are the causes?  The ovaries stop producing the female hormones estrogen and progesterone. Other causes include:  Surgery to remove both ovaries.  The ovaries stop functioning for no known reason.  Tumors of the pituitary gland in the brain.  Medical disease that affects the ovaries and hormone production.  Radiation treatment to the abdomen or pelvis.  Chemotherapy that affects the ovaries. What are the signs or symptoms?  Hot flashes.  Night sweats.  Decrease in sex drive.  Vaginal dryness and thinning of the vagina causing painful intercourse.  Dryness of the skin and developing wrinkles.  Headaches.  Tiredness.  Irritability.  Memory  problems.  Weight gain.  Bladder infections.  Hair growth of the face and chest.  Infertility. More serious symptoms include:  Loss of bone (osteoporosis) causing breaks (fractures).  Depression.  Hardening and narrowing of the arteries (atherosclerosis) causing heart attacks and strokes. How is this diagnosed?  When the menstrual periods have stopped for 12 straight months.  Physical exam.  Hormone studies of the blood. How is this treated? There are many treatment choices and nearly as many questions about them. The decisions to treat or not to treat menopausal changes is an individual choice made with your health care provider. Your health care provider can discuss the treatments with you. Together, you can decide which treatment will work best for you. Your treatment choices may include:  Hormone therapy (estrogen and progesterone).  Non-hormonal medicines.  Treating the individual symptoms with medicine (for example antidepressants for depression).  Herbal medicines that may help specific symptoms.  Counseling by a psychiatrist or psychologist.  Group therapy.  Lifestyle changes including:  Eating healthy.  Regular exercise.  Limiting caffeine and alcohol.  Stress management and meditation.  No treatment. Follow these instructions at home:  Take the medicine your health care provider gives you as directed.  Get plenty of sleep and rest.  Exercise regularly.  Eat a diet that contains calcium (good for the bones) and soy products (acts like estrogen hormone).  Avoid alcoholic beverages.  Do not smoke.  If you have hot flashes, dress in layers.  Take supplements, calcium, and vitamin D to strengthen bones.  You can use over-the-counter lubricants or moisturizers for vaginal dryness.  Group therapy is sometimes very helpful.  Acupuncture may be helpful in some cases. Contact a health care provider if:  You are not sure you are in  menopause.  You are having menopausal symptoms and need advice and treatment.  You are still having menstrual periods after age 77 years.  You have pain with intercourse.  Menopause is complete (no menstrual period for 12 months) and you develop vaginal bleeding.  You need a referral to a specialist (gynecologist, psychiatrist, or psychologist) for treatment. Get help right away if:  You have severe depression.  You have excessive vaginal bleeding.  You fell and think you have a broken bone.  You have pain when you urinate.  You develop leg or chest pain.  You have a fast pounding heart beat (palpitations).  You have severe headaches.  You develop vision problems.  You feel a lump in your breast.  You have abdominal pain or severe indigestion. This information is not intended to replace advice given to you by your health care provider. Make sure you discuss any questions you have with your health care provider. Document Released: 05/01/2003 Document Revised: 07/17/2015 Document Reviewed: 09/07/2012 Elsevier Interactive Patient Education  2017 [employer].

## 2016-06-22 NOTE — Assessment & Plan Note (Signed)
Effexor 37.5 mg for one week, then 75 mg daily

## 2016-06-22 NOTE — Assessment & Plan Note (Signed)
nizoral cream BID for 2-4 weeks

## 2016-06-22 NOTE — Progress Notes (Signed)
Pt is having hot flashes. Pt has rash under breast.

## 2016-06-23 MED FILL — VENLAFAXINE HCL ER 37.5 MG: 37.5 | 35 days supply | Qty: 60 | Fill #0

## 2016-06-23 MED FILL — KETOCONAZOLE 2% CREAM: 2 | 30 days supply | Qty: 60 | Fill #0

## 2016-07-07 ENCOUNTER — Encounter: Payer: Self-pay | Admitting: Family Medicine

## 2016-07-08 ENCOUNTER — Inpatient Hospital Stay (HOSPITAL_COMMUNITY)
Admission: EM | Admit: 2016-07-08 | Discharge: 2016-07-09 | DRG: 918 | Disposition: A | Discharge status: Home / Self Care | Payer: Self-pay | Attending: Family Medicine | Admitting: Family Medicine

## 2016-07-08 ENCOUNTER — Emergency Department (HOSPITAL_COMMUNITY): Payer: Self-pay

## 2016-07-08 ENCOUNTER — Encounter (HOSPITAL_COMMUNITY): Payer: Self-pay

## 2016-07-08 ENCOUNTER — Telehealth: Payer: Self-pay | Admitting: Family Medicine

## 2016-07-08 DIAGNOSIS — Z7982 Long term (current) use of aspirin: Secondary | ICD-10-CM

## 2016-07-08 DIAGNOSIS — R569 Unspecified convulsions: Secondary | ICD-10-CM

## 2016-07-08 DIAGNOSIS — M6282 Rhabdomyolysis: Secondary | ICD-10-CM | POA: Diagnosis present

## 2016-07-08 DIAGNOSIS — F1010 Alcohol abuse, uncomplicated: Secondary | ICD-10-CM | POA: Diagnosis present

## 2016-07-08 DIAGNOSIS — F1020 Alcohol dependence, uncomplicated: Secondary | ICD-10-CM | POA: Diagnosis present

## 2016-07-08 DIAGNOSIS — F1090 Alcohol use, unspecified, uncomplicated: Secondary | ICD-10-CM | POA: Diagnosis present

## 2016-07-08 DIAGNOSIS — G40909 Epilepsy, unspecified, not intractable, without status epilepticus: Secondary | ICD-10-CM

## 2016-07-08 DIAGNOSIS — R4182 Altered mental status, unspecified: Secondary | ICD-10-CM | POA: Insufficient documentation

## 2016-07-08 DIAGNOSIS — I110 Hypertensive heart disease with heart failure: Secondary | ICD-10-CM | POA: Diagnosis present

## 2016-07-08 DIAGNOSIS — M109 Gout, unspecified: Secondary | ICD-10-CM | POA: Diagnosis present

## 2016-07-08 DIAGNOSIS — Z87891 Personal history of nicotine dependence: Secondary | ICD-10-CM

## 2016-07-08 DIAGNOSIS — G9340 Encephalopathy, unspecified: Secondary | ICD-10-CM

## 2016-07-08 DIAGNOSIS — I5032 Chronic diastolic (congestive) heart failure: Secondary | ICD-10-CM | POA: Diagnosis present

## 2016-07-08 DIAGNOSIS — Z888 Allergy status to other drugs, medicaments and biological substances status: Secondary | ICD-10-CM

## 2016-07-08 DIAGNOSIS — Z79899 Other long term (current) drug therapy: Secondary | ICD-10-CM

## 2016-07-08 DIAGNOSIS — F1490 Cocaine use, unspecified, uncomplicated: Secondary | ICD-10-CM | POA: Insufficient documentation

## 2016-07-08 DIAGNOSIS — F1410 Cocaine abuse, uncomplicated: Secondary | ICD-10-CM | POA: Diagnosis present

## 2016-07-08 DIAGNOSIS — F329 Major depressive disorder, single episode, unspecified: Secondary | ICD-10-CM | POA: Diagnosis present

## 2016-07-08 DIAGNOSIS — F1029 Alcohol dependence with unspecified alcohol-induced disorder: Secondary | ICD-10-CM

## 2016-07-08 DIAGNOSIS — K219 Gastro-esophageal reflux disease without esophagitis: Secondary | ICD-10-CM | POA: Diagnosis present

## 2016-07-08 DIAGNOSIS — T405X1A Poisoning by cocaine, accidental (unintentional), initial encounter: Principal | ICD-10-CM | POA: Diagnosis present

## 2016-07-08 DIAGNOSIS — F32A Depression, unspecified: Secondary | ICD-10-CM

## 2016-07-08 DIAGNOSIS — I10 Essential (primary) hypertension: Secondary | ICD-10-CM

## 2016-07-08 DIAGNOSIS — Y9289 Other specified places as the place of occurrence of the external cause: Secondary | ICD-10-CM

## 2016-07-08 DIAGNOSIS — Z881 Allergy status to other antibiotic agents status: Secondary | ICD-10-CM

## 2016-07-08 HISTORY — DX: Essential (primary) hypertension: I10

## 2016-07-08 LAB — COMPREHENSIVE METABOLIC PANEL
ALT: 20 U/L (ref 14–54)
AST: 29 U/L (ref 15–41)
Albumin: 3.5 g/dL (ref 3.5–5.0)
Alkaline Phosphatase: 102 U/L (ref 38–126)
Anion gap: 11 (ref 5–15)
BUN: 14 mg/dL (ref 6–20)
CHLORIDE: 108 mmol/L (ref 101–111)
CO2: 21 mmol/L — ABNORMAL LOW (ref 22–32)
CREATININE: 0.89 mg/dL (ref 0.44–1.00)
Calcium: 9.2 mg/dL (ref 8.9–10.3)
Glucose, Bld: 181 mg/dL — ABNORMAL HIGH (ref 65–99)
POTASSIUM: 3.5 mmol/L (ref 3.5–5.1)
Sodium: 140 mmol/L (ref 135–145)
TOTAL PROTEIN: 7.3 g/dL (ref 6.5–8.1)
Total Bilirubin: 0.8 mg/dL (ref 0.3–1.2)

## 2016-07-08 LAB — RAPID URINE DRUG SCREEN, HOSP PERFORMED
Amphetamines: NOT DETECTED
Barbiturates: NOT DETECTED
Benzodiazepines: NOT DETECTED
COCAINE: POSITIVE — AB
OPIATES: NOT DETECTED
TETRAHYDROCANNABINOL: NOT DETECTED

## 2016-07-08 LAB — URINALYSIS, ROUTINE W REFLEX MICROSCOPIC
BACTERIA UA: NONE SEEN
Bilirubin Urine: NEGATIVE
Glucose, UA: 50 mg/dL — AB
HGB URINE DIPSTICK: NEGATIVE
KETONES UR: NEGATIVE mg/dL
LEUKOCYTES UA: NEGATIVE
Nitrite: NEGATIVE
PROTEIN: 100 mg/dL — AB
Specific Gravity, Urine: 1.023 (ref 1.005–1.030)
pH: 5 (ref 5.0–8.0)

## 2016-07-08 LAB — CBC WITH DIFFERENTIAL/PLATELET
BASOS PCT: 0 %
Basophils Absolute: 0 10*3/uL (ref 0.0–0.1)
Eosinophils Absolute: 0.1 10*3/uL (ref 0.0–0.7)
Eosinophils Relative: 1 %
HEMATOCRIT: 36.4 % (ref 36.0–46.0)
HEMOGLOBIN: 11.5 g/dL — AB (ref 12.0–15.0)
Lymphocytes Relative: 9 %
Lymphs Abs: 1.1 10*3/uL (ref 0.7–4.0)
MCH: 25.4 pg — ABNORMAL LOW (ref 26.0–34.0)
MCHC: 31.6 g/dL (ref 30.0–36.0)
MCV: 80.5 fL (ref 78.0–100.0)
Monocytes Absolute: 0.4 10*3/uL (ref 0.1–1.0)
Monocytes Relative: 4 %
NEUTROS PCT: 86 %
Neutro Abs: 10.6 10*3/uL — ABNORMAL HIGH (ref 1.7–7.7)
Platelets: 217 10*3/uL (ref 150–400)
RBC: 4.52 MIL/uL (ref 3.87–5.11)
RDW: 19 % — ABNORMAL HIGH (ref 11.5–15.5)
WBC: 12.2 10*3/uL — AB (ref 4.0–10.5)

## 2016-07-08 LAB — TROPONIN I

## 2016-07-08 LAB — ETHANOL

## 2016-07-08 LAB — SALICYLATE LEVEL: Salicylate Lvl: <7 mg/dL (ref 2.8–30.0)

## 2016-07-08 LAB — CK: CK TOTAL: 1062 U/L — AB (ref 38–234)

## 2016-07-08 LAB — PHOSPHORUS: Phosphorus: 2.7 mg/dL (ref 2.5–4.6)

## 2016-07-08 LAB — MAGNESIUM: Magnesium: 1.8 mg/dL (ref 1.7–2.4)

## 2016-07-08 LAB — ACETAMINOPHEN LEVEL

## 2016-07-08 MED ORDER — ALLOPURINOL 100 MG PO TABS
200.0000 mg | ORAL_TABLET | Freq: Every day | ORAL | Status: DC
  Administered 2016-07-09: 200 mg via ORAL
  Filled 2016-07-08: qty 2

## 2016-07-08 MED ORDER — HYDRALAZINE HCL 20 MG/ML IJ SOLN: 5.0000 mg | INTRAMUSCULAR | Status: DC | PRN

## 2016-07-08 MED ORDER — LORAZEPAM 2 MG/ML IJ SOLN
2.0000 mg | Freq: Once | INTRAMUSCULAR | Status: AC
  Administered 2016-07-08: 2 mg via INTRAMUSCULAR

## 2016-07-08 MED ORDER — SODIUM CHLORIDE 0.45 % IV SOLN
INTRAVENOUS | Status: DC
  Administered 2016-07-08: 19:00:00 via INTRAVENOUS

## 2016-07-08 MED ORDER — ADULT MULTIVITAMIN W/MINERALS CH
1.0000 | ORAL_TABLET | Freq: Every day | ORAL | Status: DC
  Administered 2016-07-08 – 2016-07-09 (×2): 1 via ORAL
  Filled 2016-07-08 (×2): qty 1

## 2016-07-08 MED ORDER — DICLOFENAC SODIUM 75 MG PO TBEC
75.0000 mg | DELAYED_RELEASE_TABLET | Freq: Two times a day (BID) | ORAL | Status: DC | PRN
  Administered 2016-07-08: 75 mg via ORAL
  Filled 2016-07-08 (×2): qty 1

## 2016-07-08 MED ORDER — VENLAFAXINE HCL ER 75 MG PO CP24
75.0000 mg | ORAL_CAPSULE | Freq: Every day | ORAL | Status: DC
  Administered 2016-07-09: 75 mg via ORAL
  Filled 2016-07-08: qty 1

## 2016-07-08 MED ORDER — ONDANSETRON HCL 4 MG PO TABS: 4.0000 mg | ORAL_TABLET | Freq: Four times a day (QID) | ORAL | Status: DC | PRN

## 2016-07-08 MED ORDER — SODIUM CHLORIDE 0.9% FLUSH
3.0000 mL | Freq: Two times a day (BID) | INTRAVENOUS | Status: DC
  Administered 2016-07-08: 3 mL via INTRAVENOUS

## 2016-07-08 MED ORDER — LORAZEPAM 2 MG/ML IJ SOLN: 1.0000 mg | Freq: Four times a day (QID) | INTRAMUSCULAR | Status: DC | PRN

## 2016-07-08 MED ORDER — VITAMIN B-1 100 MG PO TABS
100.0000 mg | ORAL_TABLET | Freq: Every day | ORAL | Status: DC
  Administered 2016-07-08 – 2016-07-09 (×2): 100 mg via ORAL
  Filled 2016-07-08 (×2): qty 1

## 2016-07-08 MED ORDER — SODIUM CHLORIDE 0.9 % IV SOLN
1000.0000 mg | Freq: Once | INTRAVENOUS | Status: AC
  Administered 2016-07-08: 1000 mg via INTRAVENOUS
  Filled 2016-07-08: qty 10

## 2016-07-08 MED ORDER — ENOXAPARIN SODIUM 40 MG/0.4ML ~~LOC~~ SOLN
40.0000 mg | SUBCUTANEOUS | Status: DC
  Administered 2016-07-08: 40 mg via SUBCUTANEOUS
  Filled 2016-07-08: qty 0.4

## 2016-07-08 MED ORDER — THIAMINE HCL 100 MG/ML IJ SOLN: 100.0000 mg | Freq: Every day | INTRAMUSCULAR | Status: DC

## 2016-07-08 MED ORDER — PANTOPRAZOLE SODIUM 20 MG PO TBEC
20.0000 mg | DELAYED_RELEASE_TABLET | Freq: Two times a day (BID) | ORAL | Status: DC
  Administered 2016-07-09: 20 mg via ORAL
  Filled 2016-07-08 (×2): qty 1

## 2016-07-08 MED ORDER — SODIUM CHLORIDE 0.9 % IV SOLN: 75.0000 mL/h | INTRAVENOUS | Status: DC

## 2016-07-08 MED ORDER — LORAZEPAM 1 MG PO TABS: 1.0000 mg | ORAL_TABLET | Freq: Four times a day (QID) | ORAL | Status: DC | PRN

## 2016-07-08 MED ORDER — LORAZEPAM 2 MG/ML IJ SOLN: 1.0000 mg | INTRAMUSCULAR | Status: DC | PRN

## 2016-07-08 MED ORDER — AMLODIPINE BESYLATE 10 MG PO TABS
10.0000 mg | ORAL_TABLET | Freq: Every day | ORAL | Status: DC
  Administered 2016-07-08 – 2016-07-09 (×2): 10 mg via ORAL
  Filled 2016-07-08 (×2): qty 1

## 2016-07-08 MED ORDER — LORAZEPAM 2 MG/ML IJ SOLN
INTRAMUSCULAR | Status: AC
  Filled 2016-07-08: qty 1

## 2016-07-08 MED ORDER — FOLIC ACID 1 MG PO TABS
1.0000 mg | ORAL_TABLET | Freq: Every day | ORAL | Status: DC
  Administered 2016-07-08 – 2016-07-09 (×2): 1 mg via ORAL
  Filled 2016-07-08 (×2): qty 1

## 2016-07-08 MED ORDER — SODIUM CHLORIDE 0.9 % IV SOLN
500.0000 mg | Freq: Two times a day (BID) | INTRAVENOUS | Status: DC
  Administered 2016-07-08 – 2016-07-09 (×2): 500 mg via INTRAVENOUS
  Filled 2016-07-08 (×4): qty 5

## 2016-07-08 MED ORDER — ONDANSETRON HCL 4 MG/2ML IJ SOLN: 4.0000 mg | Freq: Four times a day (QID) | INTRAMUSCULAR | Status: DC | PRN

## 2016-07-08 MED ORDER — LORAZEPAM 2 MG/ML IJ SOLN
INTRAMUSCULAR | Status: AC
  Filled 2016-07-08: qty 2

## 2016-07-08 NOTE — H&P (Signed)
History and Physical    Christina Byrd:[id number] DOB: [birth year] DOA: 07/08/2016  PCP: Dessa Phi, MD Patient coming from: home  Chief Complaint: Seizure  HPI: Christina Byrd is a 51 y.o. female with medical history significant of GERD, HTN, ETOH abuse.   5 caveat applies. Patient is still very post ictal and unable to provide reliable history. History provided predominantly by patient's live-in boyfriend. Per report patient was in her normal state of health until just prior to arrival when she was noted to be seizing. Patient's boyfriend states that both she and he were asleep on the couch when she started shaking. From the time he woke up until the time the seizure ended at approximately 45 seconds. Patient's boyfriend called EMS who at time of arrival patient was seizing again. Per report patient had an additional seizure in route and then one final seizure in the ED. Patient has been given Ativan as well as Keppra with no further seizures. There are no recently reported chest pain, palpitations, nausea, vomiting, fevers, abdominal pain, dysuria, frequency, neck stiffness, headache, focal neurological deficits. Approximately 2-3 days prior to episode patient was complaining of right shoulder pain which resolved after NSAIDs. Patient drinks approximately a fifth of liquor daily but denies any increased or decrease in alcohol intake.   ED Course: Shortly after arrival patient had a fourth seizure. Given thousand milligrams of Keppra and 2 mg of Ativan. No further seizures. Patient fairly combative in her post ictal state.  Review of Systems: As per HPI otherwise all other systems reviewed and are negative  Ambulatory Status: No restrictions  Past Medical History:  Diagnosis Date  . GASTROESOPHAGEAL REFLUX, NO ESOPHAGITIS 04/21/2006   Qualifier: Diagnosis of  By: Levada Schilling    . GERD (gastroesophageal reflux disease) Dx 1995  . HTN (hypertension)     History reviewed.  No pertinent surgical history.  Social History   Social History  . Marital status: Single    Spouse name: N/A  . Number of children: N/A  . Years of education: N/A   Occupational History  . Not on file.   Social History Main Topics  . Smoking status: Former Smoker    Quit date: 06/25/2012  . Smokeless tobacco: Never Used  . Alcohol use 1.5 oz/week    3 Standard drinks or equivalent per week     Comment: ADMITS TO DRINKING 3-4 BEERS/DAY  . Drug use: No  . Sexual activity: Not on file   Other Topics Concern  . Not on file   Social History Narrative   She is married to a female partner   Has 1 son   Has 5 grandchildren- 4 granddaughter and 1 grandson   Her mother is living, 64 yo. Mother lives with her sister who is a CNA but does not take care of their mother     Allergies  Allergen Reactions  . Losartan Potassium     Rash  . Levaquin [Levofloxacin In D5w] Rash  . Ace Inhibitors Cough    REACTION: cough  . Codeine Nausea Only  . Cefepime Rash    09/04/12 pm Patient started to break out with small macules after IV Vanco infusion, then macules increased in size after starting cefepime infusion.  . Vancomycin Rash    09/04/12 pm Patient started to break out with small macules after IV Vanco infusion, then macules increased in size after starting cefepime infusion.    Family History  Problem Relation Age of Onset  .  CAD Mother     Prior to Admission medications   Medication Sig Start Date End Date Taking? Authorizing Provider  amLODipine (NORVASC) 10 MG tablet Take 1 tablet (10 mg total) by mouth daily. 10/09/15   Dessa Phi, MD  aspirin EC 81 MG tablet Take 1 tablet (81 mg total) by mouth daily. 10/26/12   Rai, Delene Ruffini, MD  carvedilol (COREG) 6.25 MG tablet Take 1 tablet (6.25 mg total) by mouth 2 (two) times daily. 10/09/15   Funches, Gerilyn Nestle, MD  cetirizine (ZYRTEC) 10 MG tablet Take 10 mg by mouth daily.    [provider]  ketoconazole (NIZORAL) 2 %  creamthat he was given about Apply 1 application topically 2 (two) times daily. Under breast for 2-4 weeks 06/22/16   Dessa Phi, MD  pantoprazole (PROTONIX) 20 MG tablet TAKE 1 TABLET BY MOUTH 2 TIMES DAILY BEFORE A MEAL 10/09/15   Funches, Gerilyn Nestle, MD  venlafaxine XR (EFFEXOR XR) 37.5 MG 24 hr capsule Take Effexor 37.5 mg daily for one week, then 75 mg daily 06/22/16   Dessa Phi, MD    Physical Exam: Vitals:   07/08/16 1318 07/08/16 1327 07/08/16 1400 07/08/16 1430  BP: 122/74  (!) 120/94 122/76  Pulse: (!) 123 (!) 112 (!) 120 (!) 105  Resp:  16  17  Temp:      TempSrc:      SpO2: 94% 95% 95% (!) 89%     General:  Appears calm and comfortable Eyes:  PERRL, EOMI, normal lids, iris ENT: Poor dentition, dry mucous membranes  Neck:  no LAD, masses or thyromegaly Cardiovascular:  RRR, no m/r/g. No LE edema.  Respiratory:  CTA bilaterally, no w/r/r. Normal respiratory effort. Abdomen:  soft, ntnd, NABS Skin:  no rash or induration seen on limited exam Musculoskeletal:  grossly normal tone BUE/BLE, good ROM, no bony abnormality Psychiatric: Sleepy. Agitated when awake. Follows basic commands. Neurologic:  CN 2-12 grossly intact, moves all extremities in coordinated fashion, sensation intact  Labs on Admission: I have personally reviewed following labs and imaging studies  CBC:  Recent Labs Lab 07/08/16 1324  WBC 12.2*  NEUTROABS 10.6*  HGB 11.5*  HCT 36.4  MCV 80.5  PLT 217   Basic Metabolic Panel:  Recent Labs Lab 07/08/16 1324  NA 140  K 3.5  CL 108  CO2 21*  GLUCOSE 181*  BUN 14  CREATININE 0.89  CALCIUM 9.2   GFR: CrCl cannot be calculated (Unknown ideal weight.). Liver Function Tests:  Recent Labs Lab 07/08/16 1324  AST 29  ALT 20  ALKPHOS 102  BILITOT 0.8  PROT 7.3  ALBUMIN 3.5   No results for input(s): LIPASE, AMYLASE in the last 168 hours. No results for input(s): AMMONIA in the last 168 hours. Coagulation Profile: No results for  input(s): INR, PROTIME in the last 168 hours. Cardiac Enzymes: No results for input(s): CKTOTAL, CKMB, CKMBINDEX, TROPONINI in the last 168 hours. BNP (last 3 results) No results for input(s): PROBNP in the last 8760 hours. HbA1C: No results for input(s): HGBA1C in the last 72 hours. CBG: No results for input(s): GLUCAP in the last 168 hours. Lipid Profile: No results for input(s): CHOL, HDL, LDLCALC, TRIG, CHOLHDL, LDLDIRECT in the last 72 hours. Thyroid Function Tests: No results for input(s): TSH, T4TOTAL, FREET4, T3FREE, THYROIDAB in the last 72 hours. Anemia Panel: No results for input(s): VITAMINB12, FOLATE, FERRITIN, TIBC, IRON, RETICCTPCT in the last 72 hours. Urine analysis:    Component Value Date/Time  COLORURINE YELLOW 07/08/2016 1202   APPEARANCEUR CLEAR 07/08/2016 1202   LABSPEC 1.023 07/08/2016 1202   PHURINE 5.0 07/08/2016 1202   GLUCOSEU 50 (A) 07/08/2016 1202   HGBUR NEGATIVE 07/08/2016 1202   HGBUR trace-lysed 08/05/2006 1541   BILIRUBINUR NEGATIVE 07/08/2016 1202   BILIRUBINUR negative 06/20/2013 1652   KETONESUR NEGATIVE 07/08/2016 1202   PROTEINUR 100 (A) 07/08/2016 1202   UROBILINOGEN 1.0 06/20/2013 1652   UROBILINOGEN 1.0 08/05/2006 1541   NITRITE NEGATIVE 07/08/2016 1202   LEUKOCYTESUR NEGATIVE 07/08/2016 1202    Creatinine Clearance: CrCl cannot be calculated (Unknown ideal weight.).  Sepsis Labs: @LABRCNTIP (procalcitonin:4,lacticidven:4) )No results found for this or any previous visit (from the past 240 hour(s)).   Radiological Exams on Admission: Ct Head Wo Contrast  Result Date: 07/08/2016 CLINICAL DATA:  New onset seizure today. EXAM: CT HEAD WITHOUT CONTRAST TECHNIQUE: Contiguous axial images were obtained from the base of the skull through the vertex without intravenous contrast. COMPARISON:  None. FINDINGS: Brain: No evidence of acute infarction, hemorrhage, hydrocephalus, extra-axial collection or mass lesion/mass effect. 3 or 4 small  areas of cortical gliosis involving the left frontal, parietal, and occipital convexities consistent with remote infarcts. Partially empty sella appearance, considered incidental in this setting. Vascular: No hyperdense vessel or unexpected calcification. Skull: Normal. Negative for fracture or focal lesion. Sinuses/Orbits: No acute finding. IMPRESSION: 1. No acute finding 2. 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 Electronically Signed   By: Marnee Spring M.D.   On: 07/08/2016 15:01   Dg Chest Port 1 View  Result Date: 07/08/2016 CLINICAL DATA:  New seizure activity. EXAM: PORTABLE CHEST 1 VIEW COMPARISON:  September 02, 2012 FINDINGS: Cardiomegaly. The hila and mediastinum are unchanged. No pulmonary nodules or masses. No focal infiltrates or overt edema. IMPRESSION: No active disease. Electronically Signed   By: Gerome Sam III M.D   On: 07/08/2016 12:55    EKG: Independently reviewed. Sinus tach.  Assessment/Plan Active Problems:   HYPERTENSION, BENIGN ESSENTIAL   GERD (gastroesophageal reflux disease)   EtOH dependence (HCC)   Depression   Seizure (HCC)    New onset Seizure: etiology not immediately clear but may be due in part to cocaine (and other drugs) use and ETOH use/withdrawal. Neuro following. Loaded w/ Keppra and Ativan in ED. Continues to be somnolent at time of admission. CT head w/o acute process. No evidence of infectious etiology. Doubt medication induced.  - continue Keppra - Ativan PRN - Seizure precautions - f/u Neuro recs - EEG - CK now and in am - UDS, Tylenol level, ASA level  Hyperglycemia: 181 on admission. Suspect secondary to seizure as no h/o DM - CBG Q4 - A1c  Cocaine use: Pt UDS + for cocaine. pts live in boyfriend denies any use of any illegal drugs in the home. Boyfriend does not know that UDS + for cocaine. No previous documented drug use - Discuss drug use w/ pt when awake and when boyfriend is not in the room.   ETOH  abuse: reportedly drinks a fifth of liquor daily at baseline.  - CIWA  Chronic diastolic congestive heart failure: No evidence of decompensation. Last Echo showing EF of 55% grade 1 diastolic dysfunction. - Stop beta blocker due to cocaine use - Strict I's and O's, daily weights  HTN: - continue norvasc,  - Hold coreg due to cocaine use - Hydralazine when necessary  Depression: appears to be at baseline - continue effexor  Gout: - continue allopurinol  GERD: -  continue protonix   DVT prophylaxis: Lovenox  Code Status: full  Family Communication: boyfriend Disposition Plan: pending wokeup and resolution.   Consults called: Neuro  Admission status: inpt    Sontee Desena J MD [facility]  If 7PM-7AM, please contact night-coverage [email or web address] Password [email or web address]  07/08/2016, 4:03 PM

## 2016-07-08 NOTE — ED Notes (Signed)
Attempted Report 

## 2016-07-08 NOTE — ED Provider Notes (Signed)
[facility]-EMERGENCY DEPT Provider Note   CSN: [id number] Arrival date & time: 07/08/16  1128     History   Chief Complaint Chief Complaint  Patient presents with  . Seizures    HPI Christina Byrd is a 51 y.o. female.  51 year old female with past medical history including alcohol abuse, hypertension, sCHF who p/w seizures. EMS states that the patient's family called them for 2 seizures that they witnessed at home. When they arrived, the patient was post ictal and combative but she eventually returns to normal mentation. She then had a brief 1 minute, tonic clonic seizure in transport and was again post ictal on arrival to the ER. They are unaware of any history of seizure activity.  LEVEL 5 CAVEAT DUE TO AMS   The history is provided by the EMS personnel.  Seizures      Past Medical History:  Diagnosis Date  . GASTROESOPHAGEAL REFLUX, NO ESOPHAGITIS 04/21/2006   Qualifier: Diagnosis of  By: Levada Schilling    . GERD (gastroesophageal reflux disease) Dx 1995    Patient Active Problem List   Diagnosis Date Noted  . Hot flash, menopausal 06/22/2016  . Intertrigo 06/22/2016  . Gout of left ankle 10/09/2015  . Left hip pain 09/02/2015  . EtOH dependence (HCC) 09/02/2015  . Colonoscopy refused 09/02/2015  . Hyperuricemia 05/27/2015  . 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 Pain and swelling of left ankle 12/02/2014  . Tendinitis of right hip flexor 07/23/2014  . Allergic rhinitis 05/13/2014  . GERD (gastroesophageal reflux disease) 04/01/2014  . Tendonitis, Achilles, right 04/01/2014  . Osteoarthritis of left knee 12/11/2013  . Chronic systolic heart failure (HCC) 02/05/2013  . Ex-smoker 11/09/2008  . OBESITY 08/12/2006  . HYPERTENSION, BENIGN ESSENTIAL 08/12/2006    History reviewed. No pertinent surgical history.  OB History    No data available       Home Medications    Prior to Admission medications   Medication Sig Start Date End Date  Taking? Authorizing Provider  amLODipine (NORVASC) 10 MG tablet Take 1 tablet (10 mg total) by mouth daily. 10/09/15   Dessa Phi, MD  aspirin EC 81 MG tablet Take 1 tablet (81 mg total) by mouth daily. 10/26/12   Rai, Delene Ruffini, MD  carvedilol (COREG) 6.25 MG tablet Take 1 tablet (6.25 mg total) by mouth 2 (two) times daily. 10/09/15   Funches, Gerilyn Nestle, MD  cetirizine (ZYRTEC) 10 MG tablet Take 10 mg by mouth daily.    [provider]  ketoconazole (NIZORAL) 2 % cream Apply 1 application topically 2 (two) times daily. Under breast for 2-4 weeks 06/22/16   Funches, Gerilyn Nestle, MD  pantoprazole (PROTONIX) 20 MG tablet TAKE 1 TABLET BY MOUTH 2 TIMES DAILY BEFORE A MEAL 10/09/15   Funches, Gerilyn Nestle, MD  venlafaxine XR (EFFEXOR XR) 37.5 MG 24 hr capsule Take Effexor 37.5 mg daily for one week, then 75 mg daily 06/22/16   Dessa Phi, MD    Family History Family History  Problem Relation Age of Onset  . CAD Mother     Social History Social History  Substance Use Topics  . Smoking status: Former Smoker    Quit date: 06/25/2012  . Smokeless tobacco: Never Used  . Alcohol use 1.5 oz/week    3 Standard drinks or equivalent per week     Comment: ADMITS TO DRINKING 3-4 BEERS/DAY     Allergies   Losartan potassium; Levaquin [levofloxacin in d5w]; Ace inhibitors; Codeine; Cefepime; and  Vancomycin   Review of Systems Review of Systems  Unable to perform ROS: Mental status change  Neurological: Positive for seizures.     Physical Exam Updated Vital Signs BP 122/76   Pulse (!) 105   Temp 98.4 F (36.9 C) (Oral)   Resp 17   LMP 12/17/2014   SpO2 (!) 89%   Physical Exam  Constitutional: She appears well-developed and well-nourished. She appears distressed.  Flailing in bed, agitated, yelling  HENT:  Head: Normocephalic and atraumatic.  Moist mucous membranes  Eyes: Conjunctivae are normal. Pupils are equal, round, and reactive to light.  Neck: Neck supple.    Cardiovascular: Regular rhythm and normal heart sounds.  Tachycardia present.   No murmur heard. Pulmonary/Chest: Effort normal and breath sounds normal.  Abdominal: Soft. Bowel sounds are normal. She exhibits no distension. There is no tenderness.  Musculoskeletal: She exhibits no edema.  Neurological: She is alert.  Disoriented, yelling, moving all 4 extremities with normal strength, unable to follow commands  Skin: Skin is warm. She is diaphoretic.  Nursing note and vitals reviewed.    ED Treatments / Results  Labs (all labs ordered are listed, but only abnormal results are displayed) Labs Reviewed  COMPREHENSIVE METABOLIC PANEL - Abnormal; Notable for the following:       Result Value   CO2 21 (*)    Glucose, Bld 181 (*)    All other components within normal limits  ACETAMINOPHEN LEVEL - Abnormal; Notable for the following:    Acetaminophen (Tylenol), Serum <10 (*)    All other components within normal limits  CBC WITH DIFFERENTIAL/PLATELET - Abnormal; Notable for the following:    WBC 12.2 (*)    Hemoglobin 11.5 (*)    MCH 25.4 (*)    RDW 19.0 (*)    Neutro Abs 10.6 (*)    All other components within normal limits  RAPID URINE DRUG SCREEN, HOSP PERFORMED - Abnormal; Notable for the following:    Cocaine POSITIVE (*)    All other components within normal limits  URINALYSIS, ROUTINE W REFLEX MICROSCOPIC - Abnormal; Notable for the following:    Glucose, UA 50 (*)    Protein, ur 100 (*)    Squamous Epithelial / LPF 0-5 (*)    All other components within normal limits  ETHANOL  SALICYLATE LEVEL    EKG  EKG Interpretation  Date/Time:  Thursday Jul 08 2016 11:50:31 EDT Ventricular Rate:  121 PR Interval:    QRS Duration: 77 QT Interval:  327 QTC Calculation: 462 R Axis:   42 Text Interpretation:  Sinus tachycardia Probable left atrial enlargement tachycardia new from previous Confirmed by Frederick Peers [id number]) on 07/08/2016 12:01:17 PM       Radiology Ct  Head Wo Contrast  Result Date: 07/08/2016 CLINICAL DATA:  New onset seizure today. EXAM: CT HEAD WITHOUT CONTRAST TECHNIQUE: Contiguous axial images were obtained from the base of the skull through the vertex without intravenous contrast. COMPARISON:  None. FINDINGS: Brain: No evidence of acute infarction, hemorrhage, hydrocephalus, extra-axial collection or mass lesion/mass effect. 3 or 4 small areas of cortical gliosis involving the left frontal, parietal, and occipital convexities consistent with remote infarcts. Partially empty sella appearance, considered incidental in this setting. Vascular: No hyperdense vessel or unexpected calcification. Skull: Normal. Negative for fracture or focal lesion. Sinuses/Orbits: No acute finding. IMPRESSION: 1. No acute finding 2. 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 Electronically Signed   By: Kathrynn Ducking.D.  On: 07/08/2016 15:01   Dg Chest Port 1 View  Result Date: 07/08/2016 CLINICAL DATA:  New seizure activity. EXAM: PORTABLE CHEST 1 VIEW COMPARISON:  September 02, 2012 FINDINGS: Cardiomegaly. The hila and mediastinum are unchanged. No pulmonary nodules or masses. No focal infiltrates or overt edema. IMPRESSION: No active disease. Electronically Signed   By: Gerome Sam III M.D   On: 07/08/2016 12:55    Procedures .Critical Care Performed by: Laurence Spates Authorized by: Laurence Spates   Critical care provider statement:    Critical care time (minutes):  35   Critical care time was exclusive of:  Separately billable procedures and treating other patients   Critical care was necessary to treat or prevent imminent or life-threatening deterioration of the following conditions:  CNS failure or compromise   Critical care was time spent personally by me on the following activities:  Development of treatment plan with patient or surrogate, discussions with consultants, evaluation of patient's response to treatment,  examination of patient, obtaining history from patient or surrogate, ordering and performing treatments and interventions, ordering and review of laboratory studies, ordering and review of radiographic studies and re-evaluation of patient's condition   (including critical care time)  Medications Ordered in ED Medications  LORazepam (ATIVAN) injection 2 mg (2 mg Intramuscular Given 07/08/16 1139)  levETIRAcetam (KEPPRA) 1,000 mg in sodium chloride 0.9 % 100 mL IVPB (0 mg Intravenous Stopped 07/08/16 1238)     Initial Impression / Assessment and Plan / ED Course  I have reviewed the triage vital signs and the nursing notes.  Pertinent labs & imaging results that were available during my care of the patient were reviewed by me and considered in my medical decision making (see chart for details).    Pt p/w Several seizure episodes witnessed by family and later witnessed by EMS during transport. She was postictal on arrival to the ED with agitation, moving all 4 extremities. Gave Ativan for patient and staff safety. Shortly after I left the room, the patient had another tonic-clonic seizure that lasted approximately 1 minute and then resolved prior to redosing of Ativan. Gave Keppra load.   Labwork shows WBC 12.2, hemoglobin 11.5, reassuring CMP, negative alcohol level. Family states that she drinks approximately 1/5 of liquor every evening and has not tried to cut back or quit recently. It is possible that her seizures are related to alcohol withdrawal although it does not sound like she has been without alcohol for an extended period of time. No obvious source of infection on UA or chest x-ray. Head CT negative acute, remote cortical infarcts. I discussed the case with neurology, Dr. Roxy Manns, who will see the patient in consultation. Discussed admission with [facility] hospitalist, Dr. Konrad Dolores, and pt admitted for further care.  Final Clinical Impressions(s) / ED Diagnoses   Final diagnoses:  Seizures  (HCC)  Altered mental status, unspecified altered mental status type    New Prescriptions New Prescriptions   No medications on file     Dartanion Teo, Ambrose Finland, MD 07/08/16 [phone]

## 2016-07-08 NOTE — Consult Note (Signed)
Neurology Consult Note  Reason for Consultation: multiple seizures  Requesting provider: Frederick Peers, MD  CC: Patient is lethargic after getting Ativan today so offers limited information  HPI: This is a 51 year old woman who presents to the emergency department for evaluation of seizures. History is obtained directly from review of the medical record as the patient offers limited information due to encephalopathy and sedation. Her boyfriend is present at the bedside and offers additional information.  The patient boyfriend reports that he was asleep with the patient this morning when he noticed that she was shaking. He describes generalized shaking that lasted approximately 45 seconds. He called 911. EMS arrived to find the patient seizing again. She had a third seizure while she was being transported to the hospital. On arrival to the emergency department, she was noted to be postictal and agitated but moving all 4 extremities. She was given Ativan because of her level of agitation. She then had a fourth witnessed generalized tonic-clonic seizure for which she was given additional Ativan and loaded with Keppra. She has remained mildly agitated but her mental status has improved. She is now being admitted for further evaluation. She has no prior history of seizures. Of note, she had a urine drug screen was positive for cocaine in the emergency department.  PMH:  Past Medical History:  Diagnosis Date  . GASTROESOPHAGEAL REFLUX, NO ESOPHAGITIS 04/21/2006   Qualifier: Diagnosis of  By: Levada Schilling    . GERD (gastroesophageal reflux disease) Dx 1995  . HTN (hypertension)     PSH:  History reviewed. No pertinent surgical history.  Family history: Family History  Problem Relation Age of Onset  . CAD Mother     Social history:  Social History   Social History  . Marital status: Single    Spouse name: N/A  . Number of children: N/A  . Years of education: N/A   Occupational History   . Not on file.   Social History Main Topics  . Smoking status: Former Smoker    Quit date: 06/25/2012  . Smokeless tobacco: Never Used  . Alcohol use 1.5 oz/week    3 Standard drinks or equivalent per week     Comment: ADMITS TO DRINKING 3-4 BEERS/DAY  . Drug use: No  . Sexual activity: Not on file   Other Topics Concern  . Not on file   Social History Narrative   She is married to a female partner   Has 1 son   Has 5 grandchildren- 4 granddaughter and 1 grandson   Her mother is living, 57 yo. Mother lives with her sister who is a CNA but does not take care of their mother     Current outpatient meds: Medications reviewed and reconciled.  Current Meds  Medication Sig  . allopurinol (ZYLOPRIM) 100 MG tablet Take 200 mg by mouth daily.  Marland Kitchen amLODipine (NORVASC) 10 MG tablet Take 1 tablet (10 mg total) by mouth daily.  Marland Kitchen aspirin EC 81 MG tablet Take 1 tablet (81 mg total) by mouth daily.  . benzonatate (TESSALON) 100 MG capsule Take 100 mg by mouth 3 (three) times daily as needed for cough.  . carvedilol (COREG) 6.25 MG tablet Take 1 tablet (6.25 mg total) by mouth 2 (two) times daily.  . diclofenac (VOLTAREN) 75 MG EC tablet Take 75 mg by mouth 2 (two) times daily as needed for mild pain.  Marland Kitchen ketoconazole (NIZORAL) 2 % cream Apply 1 application topically 2 (two) times daily. Under breast  for 2-4 weeks  . loratadine (CLARITIN) 10 MG tablet Take 10 mg by mouth daily.  . pantoprazole (PROTONIX) 20 MG tablet TAKE 1 TABLET BY MOUTH 2 TIMES DAILY BEFORE A MEAL  . venlafaxine XR (EFFEXOR XR) 37.5 MG 24 hr capsule Take Effexor 37.5 mg daily for one week, then 75 mg daily (Patient taking differently: Take 75 mg by mouth daily. )    Current inpatient meds: Medications reviewed and reconciled.  Current Facility-Administered Medications  Medication Dose Route Frequency Provider Last Rate Last Dose  . 0.45 % sodium chloride infusion   Intravenous Continuous Ozella Rocks, MD      . 0.9 %   sodium chloride infusion  75 mL/hr Intravenous Continuous Ozella Rocks, MD      . Melene Muller ON 07/09/2016] allopurinol (ZYLOPRIM) tablet 200 mg  200 mg Oral Daily Ozella Rocks, MD      . amLODipine (NORVASC) tablet 10 mg  10 mg Oral Daily Ozella Rocks, MD      . diclofenac (VOLTAREN) EC tablet 75 mg  75 mg Oral BID PRN Ozella Rocks, MD      . enoxaparin (LOVENOX) injection 40 mg  40 mg Subcutaneous Q24H Ozella Rocks, MD      . folic acid (FOLVITE) tablet 1 mg  1 mg Oral Daily Ozella Rocks, MD      . hydrALAZINE (APRESOLINE) injection 5-10 mg  5-10 mg Intravenous Q4H PRN Ozella Rocks, MD      . LORazepam (ATIVAN) tablet 1 mg  1 mg Oral Q6H PRN Ozella Rocks, MD       Or  . LORazepam (ATIVAN) injection 1-2 mg  1-2 mg Intravenous Q6H PRN Ozella Rocks, MD      . LORazepam (ATIVAN) injection 1-2 mg  1-2 mg Intravenous Q2H PRN Ozella Rocks, MD      . multivitamin with minerals tablet 1 tablet  1 tablet Oral Daily Ozella Rocks, MD      . ondansetron [facility]) tablet 4 mg  4 mg Oral Q6H PRN Ozella Rocks, MD       Or  . ondansetron [facility]) injection 4 mg  4 mg Intravenous Q6H PRN Ozella Rocks, MD      . Melene Muller ON 07/09/2016] pantoprazole (PROTONIX) EC tablet 20 mg  20 mg Oral BID AC Ozella Rocks, MD      . sodium chloride flush (NS) 0.9 % injection 3 mL  3 mL Intravenous Q12H Ozella Rocks, MD      . thiamine (VITAMIN B-1) tablet 100 mg  100 mg Oral Daily Ozella Rocks, MD       Or  . thiamine (B-1) injection 100 mg  100 mg Intravenous Daily Ozella Rocks, MD      . Melene Muller ON 07/09/2016] venlafaxine XR (EFFEXOR-XR) 24 hr capsule 75 mg  75 mg Oral Daily Ozella Rocks, MD        Allergies: Allergies  Allergen Reactions  . Losartan Potassium     Rash  . Levaquin [Levofloxacin In D5w] Rash  . Ace Inhibitors Cough    REACTION: cough  . Codeine Nausea Only  . Cefepime Rash    09/04/12 pm Patient started to break out with small macules  after IV Vanco infusion, then macules increased in size after starting cefepime infusion.  . Vancomycin Rash    09/04/12 pm Patient started to break out with small macules after IV Vanco  infusion, then macules increased in size after starting cefepime infusion.    ROS: As per HPI. A full 14-point review of systems is limited by the patient's encephalopathy. Presently, she complains of some shoulder pain but really doesn't endorse any other symptoms with the remainder of the review of systems negative.  PE:  BP 120/73   Pulse (!) 104   Temp 98.4 F (36.9 C) (Oral)   Resp 16   LMP 12/17/2014   SpO2 97%   General: WD obese African-American woman lying on the gurney. She is initially asleep. She arouses easily to voice. However, she is lethargic and will drift back to sleep without persistent stimulation. She is oriented to self, [facility], May, 2018. Speech is mildly dysarthric. She has no aphasia. Affect is mildly irritable.  HEENT: Normocephalic. Neck supple without LAD. MMM, OP clear. Dentition good. Sclerae anicteric. No conjunctival injection.  CV: Regular, no murmur. Carotid pulses full and symmetric, no bruits. Distal pulses 2+ and symmetric.  Lungs: CTAB.  Abdomen: Soft, obese, non-distended, non-tender. Bowel sounds present x4.  Extremities: No C/C/E. she has not IV in place in the left foot. Neuro:  CN: Pupils are equal and round. They are symmetrically reactive from 3-->2 mm. She does not participate with visual field testing due to her encephalopathy. EOMI without nystagmus. No reported diplopia. Facial sensation is intact to light touch. Face is symmetric at rest with normal strength and mobility. Hearing is intact to conversational voice. Palate elevates symmetrically and uvula is midline. Voice is normal in tone, pitch and quality. Bilateral SCM and trapezii are 5/5. Tongue is midline with normal bulk and mobility.  Motor: Normal bulk, tone, and strength. No tremor or other  abnormal movements. No drift.  Sensation: Intact to light touch, though examination may be somewhat limited by the degree of her encephalopathy.  DTRs: 2+, symmetric. Toes downgoing bilaterally. No pathologic reflexes.  Coordination: Finger-to-nose is very slow but without dysmetria. Finger taps are normal in amplitude and speed, no decrement.    Labs:  Lab Results  Component Value Date   WBC 12.2 (H) 07/08/2016   HGB 11.5 (L) 07/08/2016   HCT 36.4 07/08/2016   PLT 217 07/08/2016   GLUCOSE 181 (H) 07/08/2016   CHOL 194 10/26/2012   TRIG 230 (H) 10/26/2012   HDL 82 10/26/2012   LDLDIRECT 153 (H) 01/30/2008   LDLCALC 66 10/26/2012   ALT 20 07/08/2016   AST 29 07/08/2016   NA 140 07/08/2016   K 3.5 07/08/2016   CL 108 07/08/2016   CREATININE 0.89 07/08/2016   BUN 14 07/08/2016   CO2 21 (L) 07/08/2016   TSH 4.074 10/26/2012   INR 1.06 02/26/2013   HGBA1C 6.1 04/25/2015   Urine drug screen positive for cocaine Urinalysis notable for glucose 50, protein 100 Acetaminophen level less than 10 Salicylate level less than 7 Ethanol level less than 5  Imaging:  I have personally and independently reviewed CT scan of the head without contrast from today. This shows age-appropriate volumes. No obvious acute abnormality is seen. There is focal area of hypodensity in the right caudate which may represent enlarged perivascular spaces versus remote lacunar infarction. There are focal areas of hypodensity seen in the left frontal, left parietal, and left occipital lobes that are suggestive of encephalomalacia, possibly due to prior infarct or previous traumatic injury.  Assessment and Plan:  1. First-time seizure: She had a total of 4 seizures today. By description, these are consistent with generalized tonic-clonic  seizures. She has no prior history of seizures. Her urine drug screen is positive for cocaine which could certainly explain her seizures today. She has a history of alcohol abuse,  though family reports that she has not had any decline in her level of drinking. She apparently drinks each night and per family she has had her normal alcohol intake in the past few days. CT scan of the head did show areas of presumed encephalomalacia involving the left occipital, parietal, and frontal lobes which could predispose to seizures. Cocaine use could have further potentiated seizure activity. At this point, she has been loaded with Keppra and this can be continued at 500 mg BID pending workup. Agree with EEG. Given CT scan findings, I will add MRI scan of the brain with and without contrast for further evaluation. Continue seizure precautions.  2. Acute encephalopathy: This seems to be most consistent with postictal state and medication effect. Continue to observe. Continue to optimize metabolic status as needed. Minimize CNS active medications, particularly opiates and anything with strong anticholinergic properties. Benzodiazepines should be limited to seizure activity lasting longer than 2 minutes. I do not see anything that would suggest that she is having ongoing seizures. EEG in a.m.  3. Cocaine abuse: Urine drug screen positive for cocaine. As noted above, this could explain her seizures today. Cocaine cessation will be critical moving forward in order to minimize her risk of further seizures in the future.  4. Alcohol abuse: At this time, she is not showing overt evidence of alcohol withdrawal. Family reports that she has not had any decline in her usual level of intake, suggesting that withdrawal was not the cause of today's seizures. Monitor closely for evidence of withdrawal, treating with PRN Ativan as needed per CIWA protocol. Alcohol cessation will be essential in the long-term to help minimize risk of additional seizures and other complications from chronic alcohol abuse.  This was discussed with the admitting M.D., Dr. Konrad Dolores.   Thank you for this consultation. Neurology will  continue to follow. Please call with any urgent questions or concerns.

## 2016-07-08 NOTE — ED Notes (Signed)
Dr, little at bedside

## 2016-07-08 NOTE — ED Notes (Signed)
Patient transported to CT 

## 2016-07-08 NOTE — ED Notes (Signed)
Pt had another seizure that last about 1 min pt placed on NRB for O2 sats dropping

## 2016-07-08 NOTE — Telephone Encounter (Signed)
Attempted to call back to Mr. Lenetta Quaker x 2 number busy both times.  Called to patient Left message as she has a designated party release on file Calling to check on you. I see you were admitted for seizures. Make sure you follow up with me. Left call back # [phone]  Attempted to call back to Lenetta Quaker for 3rd time, # was still busy. Unable to leave message

## 2016-07-08 NOTE — Progress Notes (Signed)
New Admission Note:  Arrival Method: stretcher from ER Mental Orientation: alert to self & location ; pt is very impulsive Telemetry: 55m21 Assessment: Completed Skin: assessment complete IV: L foot saline locked  Pain:0/10 Safety Measures: Safety Fall Prevention Plan was given, discussed. Admission: Completed 5M18: Patient has been orientated to the room, unit and the staff. Family:visiting   Orders have been reviewed and implemented. Will continue to monitor the patient. Call light has been placed within reach and bed alarm has been activated.   Lawernce Ion ,RN

## 2016-07-08 NOTE — Telephone Encounter (Signed)
PT emergency contact Mr Ebony Cargo, need to talk to you since PT is in the hospital today since she had a few seizures today and last night,, he think she is mixing alcohol and med. together , he want you to call him ASAP since he is worry for her health. Please follow up

## 2016-07-08 NOTE — ED Notes (Signed)
Family at bedside. 

## 2016-07-08 NOTE — Progress Notes (Signed)
Pharmacy Consult:  DDI and monitoring of AEDs   50 yof admitted with seizure. Now s/p keppra 1g IV x 1 in the ED. Pharmacy consulted for DDI and monitoring of AEDs.  Plan: No interactions noted between Keppra and current/PTA meds   Babs Bertin, PharmD, BCPS Clinical Pharmacist 07/08/2016 5:45 PM

## 2016-07-08 NOTE — ED Triage Notes (Signed)
Pt from home with [facility] who had 2 seizures at home and one with EMS. Pt was very confused and combative with EMS post 1st two seizures for about 30 mins. Pt very combative with staff upon arrival.

## 2016-07-09 ENCOUNTER — Inpatient Hospital Stay (HOSPITAL_COMMUNITY)
Admit: 2016-07-09 | Discharge: 2016-07-09 | Disposition: A | Discharge status: Home / Self Care | Payer: Self-pay | Attending: Family Medicine | Admitting: Family Medicine

## 2016-07-09 ENCOUNTER — Inpatient Hospital Stay (HOSPITAL_COMMUNITY): Payer: Self-pay

## 2016-07-09 DIAGNOSIS — R569 Unspecified convulsions: Secondary | ICD-10-CM

## 2016-07-09 LAB — CBC
HEMATOCRIT: 35.7 % — AB (ref 36.0–46.0)
HEMOGLOBIN: 11.4 g/dL — AB (ref 12.0–15.0)
MCH: 25.8 pg — AB (ref 26.0–34.0)
MCHC: 31.9 g/dL (ref 30.0–36.0)
MCV: 80.8 fL (ref 78.0–100.0)
Platelets: 214 10*3/uL (ref 150–400)
RBC: 4.42 MIL/uL (ref 3.87–5.11)
RDW: 19 % — ABNORMAL HIGH (ref 11.5–15.5)
WBC: 10.2 10*3/uL (ref 4.0–10.5)

## 2016-07-09 LAB — BASIC METABOLIC PANEL
Anion gap: 11 (ref 5–15)
BUN: 8 mg/dL (ref 6–20)
CHLORIDE: 105 mmol/L (ref 101–111)
CO2: 22 mmol/L (ref 22–32)
Calcium: 8.9 mg/dL (ref 8.9–10.3)
Creatinine, Ser: 0.69 mg/dL (ref 0.44–1.00)
GFR calc Af Amer: >60 mL/min (ref >60)
GFR calc non Af Amer: >60 mL/min (ref >60)
GLUCOSE: 126 mg/dL — AB (ref 65–99)
POTASSIUM: 2.9 mmol/L — AB (ref 3.5–5.1)
Sodium: 138 mmol/L (ref 135–145)

## 2016-07-09 LAB — GLUCOSE, CAPILLARY
GLUCOSE-CAPILLARY: 137 mg/dL — AB (ref 65–99)
GLUCOSE-CAPILLARY: 152 mg/dL — AB (ref 65–99)
Glucose-Capillary: 127 mg/dL — ABNORMAL HIGH (ref 65–99)
Glucose-Capillary: 146 mg/dL — ABNORMAL HIGH (ref 65–99)

## 2016-07-09 LAB — HEMOGLOBIN A1C
HEMOGLOBIN A1C: 7 % — AB (ref 4.8–5.6)
Mean Plasma Glucose: 154 mg/dL

## 2016-07-09 LAB — TROPONIN I: Troponin I: <0.03 ng/mL (ref <0.03)

## 2016-07-09 LAB — HIV ANTIBODY (ROUTINE TESTING W REFLEX): HIV Screen 4th Generation wRfx: NONREACTIVE

## 2016-07-09 LAB — CK: Total CK: 1113 U/L — ABNORMAL HIGH (ref 38–234)

## 2016-07-09 MED ORDER — POTASSIUM CHLORIDE CRYS ER 20 MEQ PO TBCR
40.0000 meq | EXTENDED_RELEASE_TABLET | Freq: Once | ORAL | Status: AC
  Administered 2016-07-09: 40 meq via ORAL
  Filled 2016-07-09: qty 2

## 2016-07-09 NOTE — Discharge Instructions (Signed)
Christina Byrd,  You had multiple seizures likely related to cocaine use. The neurologist evaluated you and does not think you need continued medication to control your seizures since they were caused by the cocaine. Please refrain from using or having exposure to cocaine. Please follow-up with your primary care physician.

## 2016-07-09 NOTE — Care Management Note (Addendum)
Case Management Note  Patient Details  Name: Christina Byrd MRN: [id number] Date of Birth: 1965/12/18  Subjective/Objective:  From home with boyfriend, presents with seizures with no know hx of seizures.  UDS positive for cocaine.  PCP listed as Dr.  Dessa Phi at the [facility] and [facility], has hospital follow up scheduled for 6/1 10:45.                Action/Plan: NCM will follow for dc needs.  Expected Discharge Date:                  Expected Discharge Plan:  Home/Self Care  In-House Referral:     Discharge planning Services  CM Consult  Post Acute Care Choice:    Choice offered to:     DME Arranged:    DME Agency:     HH Arranged:    HH Agency:     Status of Service:  In process, will continue to follow  If discussed at Long Length of Stay Meetings, dates discussed:    Additional Comments:  Leone Haven, RN 07/09/2016, 10:58 AM

## 2016-07-09 NOTE — Progress Notes (Addendum)
Neurology Progress Note  Subjective: This patient was admitted yesterday. She has not had any further seizures since the emergency department. Her mental status is slowly improving. Her boyfriend is present at the bedside and estimates that she is probably about 75% back to her normal self. Presently, the patient states that she feels tired and still has difficulty concentrating. 10 point review of systems otherwise unremarkable.  Medications reviewed and reconciled.   Pertinent meds: Keppra 500 mg every 12 hours Thiamine 100 mg daily  Current Meds:   Current Facility-Administered Medications:  .  0.45 % sodium chloride infusion, , Intravenous, Continuous, Ozella Rocks, MD, Last Rate: 75 mL/hr at 07/08/16 1857 .  allopurinol (ZYLOPRIM) tablet 200 mg, 200 mg, Oral, Daily, Ozella Rocks, MD, 200 mg at 07/09/16 1026 .  amLODipine (NORVASC) tablet 10 mg, 10 mg, Oral, Daily, Ozella Rocks, MD, 10 mg at 07/09/16 1026 .  diclofenac (VOLTAREN) EC tablet 75 mg, 75 mg, Oral, BID PRN, Ozella Rocks, MD, 75 mg at 07/08/16 2249 .  enoxaparin (LOVENOX) injection 40 mg, 40 mg, Subcutaneous, Q24H, Ozella Rocks, MD, 40 mg at 07/08/16 1832 .  folic acid (FOLVITE) tablet 1 mg, 1 mg, Oral, Daily, Ozella Rocks, MD, 1 mg at 07/09/16 1026 .  hydrALAZINE (APRESOLINE) injection 5-10 mg, 5-10 mg, Intravenous, Q4H PRN, Ozella Rocks, MD .  levETIRAcetam (KEPPRA) 500 mg in sodium chloride 0.9 % 100 mL IVPB, 500 mg, Intravenous, Q12H, Versie Starks, MD, Stopped at 07/09/16 1042 .  LORazepam (ATIVAN) tablet 1 mg, 1 mg, Oral, Q6H PRN **OR** LORazepam (ATIVAN) injection 1-2 mg, 1-2 mg, Intravenous, Q6H PRN, Ozella Rocks, MD .  LORazepam (ATIVAN) injection 1-2 mg, 1-2 mg, Intravenous, Q2H PRN, Ozella Rocks, MD .  multivitamin with minerals tablet 1 tablet, 1 tablet, Oral, Daily, Ozella Rocks, MD, 1 tablet at 07/09/16 1027 .  ondansetron (ZOFRAN) tablet 4 mg, 4 mg, Oral, Q6H PRN  **OR** ondansetron (ZOFRAN) injection 4 mg, 4 mg, Intravenous, Q6H PRN, Ozella Rocks, MD .  pantoprazole (PROTONIX) EC tablet 20 mg, 20 mg, Oral, BID AC, Ozella Rocks, MD, 20 mg at 07/09/16 0858 .  sodium chloride flush (NS) 0.9 % injection 3 mL, 3 mL, Intravenous, Q12H, Ozella Rocks, MD, 3 mL at 07/08/16 2133 .  thiamine (VITAMIN B-1) tablet 100 mg, 100 mg, Oral, Daily, 100 mg at 07/09/16 1027 **OR** thiamine (B-1) injection 100 mg, 100 mg, Intravenous, Daily, Ozella Rocks, MD .  venlafaxine XR [facility]) 24 hr capsule 75 mg, 75 mg, Oral, Daily, Ozella Rocks, MD, 75 mg at 07/09/16 1025  Objective:  Temp:  [98 F (36.7 C)-99.1 F (37.3 C)] 98.1 F (36.7 C) (05/18 1020) Pulse Rate:  [86-120] 86 (05/18 1020) Resp:  [16-21] 18 (05/18 1020) BP: (99-125)/(55-94) 111/66 (05/18 1020) SpO2:  [89 %-98 %] 98 % (05/18 1020) Weight:  [90.3 kg (199 lb)] 90.3 kg (199 lb) (05/17 1800)  General: WDWN African-American woman lying in bed. She was initially asleep but arouses to voice. She is alert and oriented to everything but the day and date. Speech is clear without dysarthria.  HEENT: Neck is supple without lymphadenopathy. Mucous membranes are moist and the oropharynx is clear. Sclerae are anicteric. There is no conjunctival injection.  CV: Regular, no murmur. Carotid pulses are 2+ and symmetric with no bruits. Distal pulses 2+ and symmetric.  Lungs: CTAB  Extremities: No C/C/E. Neuro: MS: As noted above.  CN:  Pupils are equal and reactive from 3-->2 mm bilaterally. EOMI, no nystagmus. There is some break up of spin pursuits. Facial sensation is intact to light touch. Face is symmetric at rest with normal strength and mobility. Hearing is intact to conversational voice. Voice is normal in tone and quality. Palate elevates symmetrically. Uvula is midline. Bilateral SCM and trapezii are 5/5. Tongue is midline with normal bulk and mobility.  Motor: Normal bulk, tone, and strength  throughout. No pronator drift. No tremor or other abnormal movements are observed.  Sensation: Intact to light touch.  DTRs: 2+, symmetric. Toes are downgoing bilaterally. No pathological reflexes.  Coordination: Finger-to-nose is slow but without dysmetria bilaterally.   Labs: Lab Results  Component Value Date   WBC 10.2 07/09/2016   HGB 11.4 (L) 07/09/2016   HCT 35.7 (L) 07/09/2016   PLT 214 07/09/2016   GLUCOSE 126 (H) 07/09/2016   CHOL 194 10/26/2012   TRIG 230 (H) 10/26/2012   HDL 82 10/26/2012   LDLDIRECT 153 (H) 01/30/2008   LDLCALC 66 10/26/2012   ALT 20 07/08/2016   AST 29 07/08/2016   NA 138 07/09/2016   K 2.9 (L) 07/09/2016   CL 105 07/09/2016   CREATININE 0.69 07/09/2016   BUN 8 07/09/2016   CO2 22 07/09/2016   TSH 4.074 10/26/2012   INR 1.06 02/26/2013   HGBA1C 7.0 (H) 07/08/2016   CBC Latest Ref Rng & Units 07/09/2016 07/08/2016 02/26/2013  WBC 4.0 - 10.5 K/uL 10.2 12.2(H) 4.6  Hemoglobin 12.0 - 15.0 g/dL 11.4(L) 11.5(L) 11.2(L)  Hematocrit 36.0 - 46.0 % 35.7(L) 36.4 34.2(L)  Platelets 150 - 400 K/uL 214 217 294    Lab Results  Component Value Date   HGBA1C 7.0 (H) 07/08/2016   Lab Results  Component Value Date   ALT 20 07/08/2016   AST 29 07/08/2016   ALKPHOS 102 07/08/2016   BILITOT 0.8 07/08/2016   CK 1062-->1113 HIV nonreactive  Radiology:  I have personally and independently reviewed the MRI scan of the brain with and without contrast from today. This shows no acute abnormality. 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 A mild-to-moderate degree of chronic small vessel ischemic disease is present. Volumes appear normal. Ventricles are normal in size and configuration.  Other diagnostic studies:  EEG from today was normal.  A/P:   1. First-time seizure: She had several generalized tonic-clonic seizures yesterday. These occurred in the setting of cocaine use  which is the most likely provocation for her spells. She does have some encephalomalacia in the left frontal lobe which could place her at risk for seizures, with the added cocaine pushing or over the threshold. At this time, recommend cocaine cessation. The patient denies having used a cocaine and wants to know how this could've gotten into her system. She was advised that regardless of how this counterresistance this time, she should avoid any future exposure to cocaine to minimize risk of recurrent seizure. EEG is unremarkable. At this time, we'll discontinue Keppra as there is no role for anti-epileptic drug therapy and provoked seizures such as this. Management hinges on eliminating the provoking factor, and this case cocaine. The importance of alcohol cessation was also discussed with the patient. Chronic alcohol use will also place her at risk for additional seizures in the future. However, she was counseled that she should not discontinue alcohol abruptly given her long-standing history of heavy drinking. She and  her boyfriend verbalize understanding. Seizure precautions were discussed with the patient as below.  Per [facility] statutes, patients with seizures are not allowed to drive until they have been seizure-free for six months. Use caution when using heavy equipment or power tools. Avoid working on ladders or at heights. Take showers instead of baths. Ensure the water temperature is not too high on the home water heater. Do not go swimming alone. When caring for infants or small children, sit down when holding, feeding, or changing them to minimize risk of injury to the child in the event you have a seizure.   This was discussed with the patient and her boyfriend. Education was provided on the diagnosis and expected evaluation and treatment. They are in agreement with the plan as noted. They were given the opportunity to ask any questions and these were addressed to their satisfaction.    I have no further recommendations and will sign off. Please call with any additional questions or concerns should arise.  Rhona Leavens, MD [facility]

## 2016-07-09 NOTE — Procedures (Signed)
ELECTROENCEPHALOGRAM REPORT  Date of Study: 07/09/2016  Patient's Name: Christina Byrd MRN: [id number] Date of Birth: 1965-06-24  Referring Provider: Ozella Rocks, MD  Clinical History: 51 year old woman with multiple seizures.  Urine tox screen positive for cocaine.  Medications: allopurinol (ZYLOPRIM) tablet 200 mg  amLODipine (NORVASC) tablet 10 mg  diclofenac (VOLTAREN) EC tablet 75 mg  enoxaparin (LOVENOX) injection 40 mg  folic acid (FOLVITE) tablet 1 mg  hydrALAZINE (APRESOLINE) injection 5-10 mg  levETIRAcetam (KEPPRA) 500 mg  LORazepam (ATIVAN)   ondansetron (ZOFRAN)   pantoprazole (PROTONIX) EC tablet 20 mg  thiamine (B-1)   venlafaxine XR (EFFEXOR-XR) 24 hr capsule 75 mg  Technical Summary: A multichannel digital EEG recording measured by the international 10-20 system with electrodes applied with paste and impedances below 5000 ohms performed in our laboratory with EKG monitoring in an awake but mostly drowsy and asleep patient.  Hyperventilation and photic stimulation were not performed.  The digital EEG was referentially recorded, reformatted, and digitally filtered in a variety of bipolar and referential montages for optimal display.    Description: The patient is awake but mostly drowsy and asleep during the recording.  During maximal wakefulness, there is a symmetric, medium voltage 8 Hz posterior dominant rhythm that attenuates with eye opening.  The record is symmetric.  During drowsiness and sleep, there is an increase in theta slowing of the background.  Vertex waves and symmetric sleep spindles were seen.  There were no epileptiform discharges or electrographic seizures seen.    EKG lead was unremarkable.  Impression: This awake and asleep EEG is normal.    Clinical Correlation: A normal EEG does not exclude a clinical diagnosis of epilepsy.  If further clinical questions remain, prolonged EEG may be helpful.  Clinical correlation is advised.  Shon Millet, DO

## 2016-07-09 NOTE — Progress Notes (Signed)
Routine EEG completed, results pending. 

## 2016-07-09 NOTE — Progress Notes (Signed)
CSW consulted for substance abuse. Per RN, patient will wake up and answer basic questions, but mostly out of it and unable to engage in lengthy conversation or detailed assessment. CSW will continue to follow and attempt to complete substance abuse assessment and provide resources if needed once patient is better able to engage.   Blenda Nicely LCSW [phone]

## 2016-07-09 NOTE — Discharge Summary (Signed)
Physician Discharge Summary  Christina Byrd ZOX:[id number] DOB: Oct 18, 1965 DOA: 07/08/2016  PCP: Dessa Phi, MD  Admit date: 07/08/2016 Discharge date: 07/09/2016  Admitted From: Home Disposition: Home  Recommendations for Outpatient Follow-up:  1. Follow up with PCP in 1 week 2. Please obtain BMP/CBC in one week to recheck potassium and CK 3. Please follow up on the following pending results: None  Discharge Condition: Stable CODE STATUS: Full code Diet recommendation: Heart healthy   Brief/Interim Summary:  Admission HPI written by Ozella Rocks, MD   Chief Complaint: Seizure  HPI: Christina Byrd is a 51 y.o. female with medical history significant of GERD, HTN, ETOH abuse.   5 caveat applies. Patient is still very post ictal and unable to provide reliable history. History provided predominantly by patient's live-in boyfriend. Per report patient was in her normal state of health until just prior to arrival when she was noted to be seizing. Patient's boyfriend states that both she and he were asleep on the couch when she started shaking. From the time he woke up until the time the seizure ended at approximately 45 seconds. Patient's boyfriend called EMS who at time of arrival patient was seizing again. Per report patient had an additional seizure in route and then one final seizure in the ED. Patient has been given Ativan as well as Keppra with no further seizures. There are no recently reported chest pain, palpitations, nausea, vomiting, fevers, abdominal pain, dysuria, frequency, neck stiffness, headache, focal neurological deficits. Approximately 2-3 days prior to episode patient was complaining of right shoulder pain which resolved after NSAIDs. Patient drinks approximately a fifth of liquor daily but denies any increased or decrease in alcohol intake.   ED Course: Shortly after arrival patient had a fourth seizure. Given thousand milligrams of Keppra and 2 mg of  Ativan. No further seizures. Patient fairly combative in her post ictal state.    Hospital course:  Seizures Likely secondary to cocaine use. Patient was given Ativan and started on Keppra for seizures. Neurology evaluated and discontinued antiepileptic drugs. EEG was significant for no epileptiform discharges. Recommend cessation of cocaine use.  Rhabdomyolysis Patient is asymptomatic. Secondary to seizures. Hydration and repeat CK as an outpatient.  Ethanol abuse No evidence of withdrawal.  Chronic diastolic heart failure Compensated. Beta blocker discontinued secondary to cocaine use.  Essential hypertension Continue Norvasc. Coreg discontinued as mentioned above.  Depression Continued Effexor  Gout Stable. Continued allopurinol  GERD Continued Protonix.  Discharge Diagnoses:  Active Problems:   HYPERTENSION, BENIGN ESSENTIAL   GERD (gastroesophageal reflux disease)   EtOH dependence (HCC)   Depression   Seizure (HCC)   New onset seizure [facility])    Discharge Instructions  Discharge Instructions    Call MD for:  difficulty breathing, headache or visual disturbances    Complete by:  As directed    Call MD for:  persistant dizziness or light-headedness    Complete by:  As directed    Call MD for:  persistant nausea and vomiting    Complete by:  As directed    Call MD for:  temperature >100.4    Complete by:  As directed      Allergies as of 07/09/2016      Reactions   Losartan Potassium    Rash   Levaquin [levofloxacin In D5w] Rash   Ace Inhibitors Cough   REACTION: cough   Codeine Nausea Only   Cefepime Rash   09/04/12 pm Patient started to break out  with small macules after IV Vanco infusion, then macules increased in size after starting cefepime infusion.   Vancomycin Rash   09/04/12 pm Patient started to break out with small macules after IV Vanco infusion, then macules increased in size after starting cefepime infusion.      Medication List     STOP taking these medications   carvedilol 6.25 MG tablet Commonly known as:  COREG     TAKE these medications   allopurinol 100 MG tablet Commonly known as:  ZYLOPRIM Take 200 mg by mouth daily.   amLODipine 10 MG tablet Commonly known as:  NORVASC Take 1 tablet (10 mg total) by mouth daily.   aspirin EC 81 MG tablet Take 1 tablet (81 mg total) by mouth daily.   benzonatate 100 MG capsule Commonly known as:  TESSALON Take 100 mg by mouth 3 (three) times daily as needed for cough.   diclofenac 75 MG EC tablet Commonly known as:  VOLTAREN Take 75 mg by mouth 2 (two) times daily as needed for mild pain.   ketoconazole 2 % cream Commonly known as:  NIZORAL Apply 1 application topically 2 (two) times daily. Under breast for 2-4 weeks   loratadine 10 MG tablet Commonly known as:  CLARITIN Take 10 mg by mouth daily.   pantoprazole 20 MG tablet Commonly known as:  PROTONIX TAKE 1 TABLET BY MOUTH 2 TIMES DAILY BEFORE A MEAL   venlafaxine XR 37.5 MG 24 hr capsule Commonly known as:  EFFEXOR XR Take Effexor 37.5 mg daily for one week, then 75 mg daily What changed:  how much to take  how to take this  when to take this  additional instructions      Follow-up Information    Dessa Phi, MD Follow up on 07/23/2016.   Specialty:  Family Medicine Why:  10:45 for hospital follow up Contact information: [address] [place] [address] [phone]          Allergies  Allergen Reactions  . Losartan Potassium     Rash  . Levaquin [Levofloxacin In D5w] Rash  . Ace Inhibitors Cough    REACTION: cough  . Codeine Nausea Only  . Cefepime Rash    09/04/12 pm Patient started to break out with small macules after IV Vanco infusion, then macules increased in size after starting cefepime infusion.  . Vancomycin Rash    09/04/12 pm Patient started to break out with small macules after IV Vanco infusion, then macules increased in size after starting cefepime  infusion.    Consultations:  Neurology   Procedures/Studies: Ct Head Wo Contrast  Result Date: 07/08/2016 CLINICAL DATA:  New onset seizure today. EXAM: CT HEAD WITHOUT CONTRAST TECHNIQUE: Contiguous axial images were obtained from the base of the skull through the vertex without intravenous contrast. COMPARISON:  None. FINDINGS: Brain: No evidence of acute infarction, hemorrhage, hydrocephalus, extra-axial collection or mass lesion/mass effect. 3 or 4 small areas of cortical gliosis involving the left frontal, parietal, and occipital convexities consistent with remote infarcts. Partially empty sella appearance, considered incidental in this setting. Vascular: No hyperdense vessel or unexpected calcification. Skull: Normal. Negative for fracture or focal lesion. Sinuses/Orbits: No acute finding. IMPRESSION: 1. No acute finding 2. 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 Electronically Signed   By: Marnee Spring M.D.   On: 07/08/2016 15:01   Mr Brain Wo Contrast  Result Date: 07/09/2016 CLINICAL DATA:  New onset seizure EXAM: MRI HEAD WITHOUT CONTRAST TECHNIQUE:  Multiplanar, multiecho pulse sequences of the brain and surrounding structures were obtained without intravenous contrast. COMPARISON:  Head CT 07/08/2016 FINDINGS: The examination had to be discontinued prior to completion due to patient refusal to continue. Postcontrast imaging and precontrast axial T1 weighted imaging were not obtained. Brain: The midline structures are normal. There is no focal diffusion restriction to indicate acute infarct. There is an old left frontal lobe infarct. Scattered foci of white matter hyperintensity. No intraparenchymal hematoma or chronic microhemorrhage. Brain volume is normal for age without age-advanced or lobar predominant atrophy. The dura is normal and there is no extra-axial collection. Vascular: Major intracranial arterial and venous sinus flow voids are preserved. Skull and  upper cervical spine: The visualized skull base, calvarium, upper cervical spine and extracranial soft tissues are normal. Sinuses/Orbits: No fluid levels or advanced mucosal thickening. No mastoid effusion. Normal orbits. IMPRESSION: 1. Truncated examination. 2. No acute intracranial abnormality. 3. Old left frontal lobe infarct. Electronically Signed   By: Deatra Robinson M.D.   On: 07/09/2016 05:47   Dg Chest Port 1 View  Result Date: 07/08/2016 CLINICAL DATA:  New seizure activity. EXAM: PORTABLE CHEST 1 VIEW COMPARISON:  September 02, 2012 FINDINGS: Cardiomegaly. The hila and mediastinum are unchanged. No pulmonary nodules or masses. No focal infiltrates or overt edema. IMPRESSION: No active disease. Electronically Signed   By: Gerome Sam III M.D   On: 07/08/2016 12:55     EEG (07/09/2016)  Description: The patient is awake but mostly drowsy and asleep during the recording.  During maximal wakefulness, there is a symmetric, medium voltage 8 Hz posterior dominant rhythm that attenuates with eye opening.  The record is symmetric.  During drowsiness and sleep, there is an increase in theta slowing of the background.  Vertex waves and symmetric sleep spindles were seen.  There were no epileptiform discharges or electrographic seizures seen.    EKG lead was unremarkable.  Impression: This awake and asleep EEG is normal.     Subjective: Patient reports no issues overnight. She has been free overnight. No muscle pain. No chest pain or dyspnea.  Discharge Exam: Vitals:   07/09/16 1020 07/09/16 1345  BP: 111/66 116/76  Pulse: 86 91  Resp: 18 18  Temp: 98.1 F (36.7 C) 98.2 F (36.8 C)   Vitals:   07/09/16 0007 07/09/16 0631 07/09/16 1020 07/09/16 1345  BP: (!) 99/55 118/65 111/66 116/76  Pulse: (!) 104 92 86 91  Resp: 18 18 18 18   Temp: 98.4 F (36.9 C) 98 F (36.7 C) 98.1 F (36.7 C) 98.2 F (36.8 C)  TempSrc: Oral Oral Oral Oral  SpO2: 95% 98% 98% 96%  Weight:      Height:         General: Pt is alert, awake, not in acute distress Cardiovascular: RRR, S1/S2 +, no rubs, no gallops Respiratory: CTA bilaterally, no wheezing, no rhonchi Abdominal: Soft, NT, ND, bowel sounds + Extremities: no edema, no cyanosis    The results of significant diagnostics from this hospitalization (including imaging, microbiology, ancillary and laboratory) are listed below for reference.     Microbiology: No results found for this or any previous visit (from the past 240 hour(s)).   Labs: BNP (last 3 results) No results for input(s): BNP in the last 8760 hours. Basic Metabolic Panel:  Recent Labs Lab 07/08/16 1324 07/08/16 1803 07/09/16 0444  NA 140  --  138  K 3.5  --  2.9*  CL 108  --  105  CO2 21*  --  22  GLUCOSE 181*  --  126*  BUN 14  --  8  CREATININE 0.89  --  0.69  CALCIUM 9.2  --  8.9  MG  --  1.8  --   PHOS  --  2.7  --    Liver Function Tests:  Recent Labs Lab 07/08/16 1324  AST 29  ALT 20  ALKPHOS 102  BILITOT 0.8  PROT 7.3  ALBUMIN 3.5   No results for input(s): LIPASE, AMYLASE in the last 168 hours. No results for input(s): AMMONIA in the last 168 hours. CBC:  Recent Labs Lab 07/08/16 1324 07/09/16 0444  WBC 12.2* 10.2  NEUTROABS 10.6*  --   HGB 11.5* 11.4*  HCT 36.4 35.7*  MCV 80.5 80.8  PLT 217 214   Cardiac Enzymes:  Recent Labs Lab 07/08/16 1803 07/08/16 2324 07/09/16 0444  CKTOTAL 1,062*  --  1,113*  TROPONINI <0.03 <0.03 <0.03   BNP: Invalid input(s): POCBNP CBG:  Recent Labs Lab 07/09/16 0424 07/09/16 0738 07/09/16 1129 07/09/16 1613  GLUCAP 127* 137* 152* 146*   D-Dimer No results for input(s): DDIMER in the last 72 hours. Hgb A1c  Recent Labs  07/08/16 1803  HGBA1C 7.0*   Lipid Profile No results for input(s): CHOL, HDL, LDLCALC, TRIG, CHOLHDL, LDLDIRECT in the last 72 hours. Thyroid function studies No results for input(s): TSH, T4TOTAL, T3FREE, THYROIDAB in the last 72 hours.  Invalid  input(s): FREET3 Anemia work up No results for input(s): VITAMINB12, FOLATE, FERRITIN, TIBC, IRON, RETICCTPCT in the last 72 hours. Urinalysis    Component Value Date/Time   COLORURINE YELLOW 07/08/2016 1202   APPEARANCEUR CLEAR 07/08/2016 1202   LABSPEC 1.023 07/08/2016 1202   PHURINE 5.0 07/08/2016 1202   GLUCOSEU 50 (A) 07/08/2016 1202   HGBUR NEGATIVE 07/08/2016 1202   HGBUR trace-lysed 08/05/2006 1541   BILIRUBINUR NEGATIVE 07/08/2016 1202   BILIRUBINUR negative 06/20/2013 1652   KETONESUR NEGATIVE 07/08/2016 1202   PROTEINUR 100 (A) 07/08/2016 1202   UROBILINOGEN 1.0 06/20/2013 1652   UROBILINOGEN 1.0 08/05/2006 1541   NITRITE NEGATIVE 07/08/2016 1202   LEUKOCYTESUR NEGATIVE 07/08/2016 1202   Sepsis Labs Invalid input(s): PROCALCITONIN,  WBC,  LACTICIDVEN Microbiology No results found for this or any previous visit (from the past 240 hour(s)).   SIGNED:   Jacquelin Hawking, MD [facility] 07/09/2016, 4:27 PM Pager [phone]  If 7PM-7AM, please contact night-coverage [email or web address] Password [email or web address]

## 2016-07-09 NOTE — Progress Notes (Signed)
Pt discharged home. Discharge instructions were reviewed with patient. Patient verbalized understanding.

## 2016-07-16 ENCOUNTER — Telehealth: Payer: Self-pay | Admitting: Family Medicine

## 2016-07-16 NOTE — Telephone Encounter (Signed)
Patient called the office asking to speak with provider regarding the swelling that she is experiencing on her leg. Pt also wants to discuss her hospital visit. Please follow up. Informed patient that provider had been trying to reach her a few days ago. Pt has hospital f.u appt on 6/1.  Thank you.

## 2016-07-23 ENCOUNTER — Encounter: Payer: Self-pay | Admitting: Family Medicine

## 2016-07-23 ENCOUNTER — Ambulatory Visit: Payer: Self-pay | Attending: Family Medicine | Admitting: Family Medicine

## 2016-07-23 VITALS — BP 117/79 | HR 86 | Temp 98.6°F | Wt 195.6 lb

## 2016-07-23 DIAGNOSIS — X58XXXD Exposure to other specified factors, subsequent encounter: Secondary | ICD-10-CM | POA: Insufficient documentation

## 2016-07-23 DIAGNOSIS — Z87891 Personal history of nicotine dependence: Secondary | ICD-10-CM | POA: Insufficient documentation

## 2016-07-23 DIAGNOSIS — F1411 Cocaine abuse, in remission: Secondary | ICD-10-CM | POA: Insufficient documentation

## 2016-07-23 DIAGNOSIS — I10 Essential (primary) hypertension: Secondary | ICD-10-CM

## 2016-07-23 DIAGNOSIS — T796XXA Traumatic ischemia of muscle, initial encounter: Secondary | ICD-10-CM | POA: Insufficient documentation

## 2016-07-23 DIAGNOSIS — I110 Hypertensive heart disease with heart failure: Secondary | ICD-10-CM | POA: Insufficient documentation

## 2016-07-23 DIAGNOSIS — Z7982 Long term (current) use of aspirin: Secondary | ICD-10-CM | POA: Insufficient documentation

## 2016-07-23 DIAGNOSIS — M109 Gout, unspecified: Secondary | ICD-10-CM | POA: Insufficient documentation

## 2016-07-23 DIAGNOSIS — R569 Unspecified convulsions: Secondary | ICD-10-CM | POA: Insufficient documentation

## 2016-07-23 DIAGNOSIS — I5022 Chronic systolic (congestive) heart failure: Secondary | ICD-10-CM | POA: Insufficient documentation

## 2016-07-23 DIAGNOSIS — Z7902 Long term (current) use of antithrombotics/antiplatelets: Secondary | ICD-10-CM | POA: Insufficient documentation

## 2016-07-23 DIAGNOSIS — R739 Hyperglycemia, unspecified: Secondary | ICD-10-CM | POA: Insufficient documentation

## 2016-07-23 DIAGNOSIS — T796XXD Traumatic ischemia of muscle, subsequent encounter: Secondary | ICD-10-CM | POA: Insufficient documentation

## 2016-07-23 LAB — POCT GLYCOSYLATED HEMOGLOBIN (HGB A1C): HEMOGLOBIN A1C: 7.5

## 2016-07-23 MED ORDER — METFORMIN HCL ER 500 MG PO TB24: 500.0000 mg | ORAL_TABLET | Freq: Every day | ORAL | 2 refills | Status: DC

## 2016-07-23 MED ORDER — AMLODIPINE BESYLATE 10 MG PO TABS
10.0000 mg | ORAL_TABLET | Freq: Every day | ORAL | 3 refills | Status: DC
Start: 2016-07-23 — End: 2016-10-15

## 2016-07-23 MED FILL — ALLOPURINOL 100 MG TABLET: 100 | 30 days supply | Qty: 60 | Fill #5

## 2016-07-23 MED FILL — METFORMIN HCL ER 500 MG TAB: 500 | 30 days supply | Qty: 30 | Fill #0

## 2016-07-23 MED FILL — AMLODIPINE BESYLATE 10 MG T: 10 | 30 days supply | Qty: 30 | Fill #8

## 2016-07-23 MED FILL — PANTOPRAZOLE SOD DR 20 MG T: 20 | 30 days supply | Qty: 60 | Fill #7

## 2016-07-23 MED FILL — VENLAFAXINE HCL ER 37.5 MG: 37.5 | 35 days supply | Qty: 60 | Fill #1

## 2016-07-23 NOTE — Telephone Encounter (Signed)
Patient has OV on 07/23/16 Her issues will be addressed then

## 2016-07-23 NOTE — Assessment & Plan Note (Signed)
A: HTN well controlled Med: compliant P: Continue Norvasc 10 mg daily

## 2016-07-23 NOTE — Assessment & Plan Note (Signed)
Post stroke rhabdomyolysis Plan: Ice achy shoulders Continue increased intake of water Repeat BMP and CK today

## 2016-07-23 NOTE — Assessment & Plan Note (Signed)
No recurrent seizure Advised ETOH reduction Strict avoidance of substance abuse, patient denies use

## 2016-07-23 NOTE — Assessment & Plan Note (Signed)
Elevated A1c x 2 readings consistent with type 2 diabetes Plan: Reduce ETOH Start metformin 500 mg XR once daily

## 2016-07-23 NOTE — Progress Notes (Signed)
Subjective:  Patient ID: Christina Byrd, female    DOB: 1965-07-07  Age: 51 y.o. MRN: [id number]  CC: Hospitalization Follow-up   HPI Christina Byrd  HTN, gout, ETOH abuse, chronic systolic CHF, cocaine use she presents for   1. Hospitalization follow up: she was hospitalized from from 5/17-5/18/18 for seizure x 4 in setting of cocaine use. She had 2 seizures at home, 1 seizure en route to ED and 1 seizure in ED. She was treated with keppra and ativan. Neurology was consulted. She had a negative EEG. CT head and MRI were negative for acute findings but did reveal old left front lobe infarct. She takes aspirin 81 mg daily. Her UDS was positive for cocaine. Her CK was initially 1113 > 1062.   Today, she denies cocaine and drug use. She reports taking 2 diclofenac and drinking alcohol on day of her seizure. She reports drinking 1 corona light and 3 shots of brandy. 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    2. Hyperglycemia: her blood sugar during her hospitalization was 181. A1c was obtained and 7.0. This was in the setting of seizures x 4. She has no personal history of diabetes. No family history. She report hot flashes. She is taking Effexor 75 mg daily but reports it causes GI upset.   3. HTN: she is taking Norvasc 10 mg daily. Her coreg was stopped due to low BP. She has history or reduced EF on ECHO down to 20-25% in 2014. Repeat 02/2016 ECHO revealed normal EF of 55-60%.   Social History  Substance Use Topics  . Smoking status: Former Smoker    Quit date: 06/25/2012  . Smokeless tobacco: Never Used  . Alcohol use 1.5 oz/week    3 Standard drinks or equivalent per week     Comment: ADMITS TO DRINKING 3-4 BEERS/DAY    Outpatient Medications Prior to Visit  Medication Sig Dispense Refill  . allopurinol (ZYLOPRIM) 100 MG tablet Take 200 mg by mouth daily.    Marland Kitchen amLODipine (NORVASC) 10 MG tablet Take 1 tablet (10 mg total) by mouth daily.  90 tablet 3  . aspirin EC 81 MG tablet Take 1 tablet (81 mg total) by mouth daily.    . benzonatate (TESSALON) 100 MG capsule Take 100 mg by mouth 3 (three) times daily as needed for cough.    . diclofenac (VOLTAREN) 75 MG EC tablet Take 75 mg by mouth 2 (two) times daily as needed for mild pain.    Marland Kitchen ketoconazole (NIZORAL) 2 % cream Apply 1 application topically 2 (two) times daily. Under breast for 2-4 weeks 60 g 0  . loratadine (CLARITIN) 10 MG tablet Take 10 mg by mouth daily.    . pantoprazole (PROTONIX) 20 MG tablet TAKE 1 TABLET BY MOUTH 2 TIMES DAILY BEFORE A MEAL 180 tablet 3  . venlafaxine XR (EFFEXOR XR) 37.5 MG 24 hr capsule Take Effexor 37.5 mg daily for one week, then 75 mg daily (Patient taking differently: Take 75 mg by mouth daily. ) 60 capsule 2   No facility-administered medications prior to visit.     ROS Review of Systems  Constitutional: Negative for chills and fever.  Eyes: Negative for visual disturbance.  Respiratory: Negative for shortness of breath.   Cardiovascular: Negative for chest pain.  Gastrointestinal: Positive for nausea. Negative for abdominal pain and blood in stool.  Musculoskeletal: Positive for arthralgias. Negative for back pain.  Skin: Negative for rash.  Allergic/Immunologic: Negative for immunocompromised state.  Hematological: Negative for adenopathy. Does not bruise/bleed easily.  Psychiatric/Behavioral: Negative for dysphoric mood and suicidal ideas.    Objective:  BP 117/79   Pulse 86   Temp 98.6 F (37 C) (Oral)   Wt 195 lb 9.6 oz (88.7 kg)   LMP 12/17/2014   SpO2 98%   BMI 38.20 kg/m   BP/Weight 07/23/2016 07/09/2016 9/41/7408  Systolic BP 144 818 -  Diastolic BP 79 72 -  Wt. (Lbs) 195.6 - 199  BMI 38.2 - 38.86    Physical Exam  Constitutional: She is oriented to person, place, and time. She appears well-developed and well-nourished. No distress.  HENT:  Head: Normocephalic and atraumatic.  Right Ear: Tympanic membrane,  external ear and ear canal normal.  Left Ear: Tympanic membrane and external ear normal.  Mouth/Throat: Oropharynx is clear and moist.  Eyes: Conjunctivae and EOM are normal. Pupils are equal, round, and reactive to light.  Neck: Normal range of motion. Neck supple.  Cardiovascular: Normal rate, regular rhythm, normal heart sounds and intact distal pulses.   Pulmonary/Chest: Effort normal and breath sounds normal.  Musculoskeletal: She exhibits no edema.  Lymphadenopathy:    She has no cervical adenopathy.  Neurological: She is alert and oriented to person, place, and time.  Skin: Skin is warm and dry. No rash noted.  Psychiatric: She has a normal mood and affect.    Lab Results  Component Value Date   HGBA1C 7.0 (H) 07/08/2016   Lab Results  Component Value Date   HGBA1C 7.5 07/23/2016     Assessment & Plan:   Christina Byrd was seen today for hospitalization follow-up.  Diagnoses and all orders for this visit:  Traumatic rhabdomyolysis, subsequent encounter -     BMP8+EGFR -     CK total and CKMB (cardiac)not at [facility]  Hyperglycemia -     Cancel: Hemoglobin A1c -     Cancel: POCT glucose (manual entry) -     POCT glycosylated hemoglobin (Hb A1C) -     metFORMIN (GLUCOPHAGE XR) 500 MG 24 hr tablet; Take 1 tablet (500 mg total) by mouth daily with breakfast.  HYPERTENSION, BENIGN ESSENTIAL -     amLODipine (NORVASC) 10 MG tablet; Take 1 tablet (10 mg total) by mouth daily.  New onset seizure ([place])    No orders of the defined types were placed in this encounter.   Follow-up: Return in about 6 weeks (around 09/03/2016) for HTN and hot flashes.   Boykin Nearing MD

## 2016-07-23 NOTE — Patient Instructions (Addendum)
Jashayla was seen today for hospitalization follow-up.  Diagnoses and all orders for this visit:  Traumatic rhabdomyolysis, subsequent encounter -     BMP8+EGFR -     CK total and CKMB (cardiac)not at [facility]  Hyperglycemia -     Hemoglobin A1c  HYPERTENSION, BENIGN ESSENTIAL -     amLODipine (NORVASC) 10 MG tablet; Take 1 tablet (10 mg total) by mouth daily.   Ice left shoulder for 20 minutes twice daily  F/u in 6 weeks for HTN and hot flashes  Dr. Adrian Blackwater

## 2016-07-24 LAB — BMP8+EGFR
BUN / CREAT RATIO: 15 (ref 9–23)
BUN: 11 mg/dL (ref 6–24)
CALCIUM: 10.2 mg/dL (ref 8.7–10.2)
CO2: 22 mmol/L (ref 18–29)
CREATININE: 0.74 mg/dL (ref 0.57–1.00)
Chloride: 100 mmol/L (ref 96–106)
GFR calc Af Amer: 109 mL/min/{1.73_m2} (ref >59)
GFR calc non Af Amer: 95 mL/min/{1.73_m2} (ref >59)
GLUCOSE: 212 mg/dL — AB (ref 65–99)
Potassium: 4.2 mmol/L (ref 3.5–5.2)
Sodium: 139 mmol/L (ref 134–144)

## 2016-07-24 LAB — CK TOTAL AND CKMB (NOT AT ARMC): CK TOTAL: 53 U/L (ref 24–173)

## 2016-07-24 LAB — HEMOGLOBIN A1C
ESTIMATED AVERAGE GLUCOSE: 171 mg/dL
HEMOGLOBIN A1C: 7.6 % — AB (ref 4.8–5.6)

## 2016-07-27 ENCOUNTER — Telehealth: Payer: Self-pay | Admitting: Family Medicine

## 2016-07-27 DIAGNOSIS — E119 Type 2 diabetes mellitus without complications: Secondary | ICD-10-CM

## 2016-07-27 NOTE — Telephone Encounter (Signed)
Patient called requesting to speak to PCP regarding new medication prescribed, patient states she will not start taking medication until she knows what they are for, please f/up

## 2016-07-28 NOTE — Telephone Encounter (Signed)
Pt returned phone call and was informed of lab results and start of new medication.. Pt states that metformin pill is too large to swollen can another medication b

## 2016-07-29 MED ORDER — METFORMIN HCL 500 MG PO TABS: 500.0000 mg | ORAL_TABLET | Freq: Two times a day (BID) | ORAL | 2 refills | Status: DC

## 2016-07-29 NOTE — Telephone Encounter (Signed)
Please inform patient that the immediate release metformin comes in a smaller pill  Start with one pill a day for the first week Then two pills a day More Gi upset is possible with this compared to the extended release metformin

## 2016-07-30 MED FILL — ?METFORMIN HCL 500MG TABLET: 500 | 30 days supply | Qty: 60 | Fill #0

## 2016-07-30 NOTE — Telephone Encounter (Signed)
Pt was called and informed of new metformin medication being sen to the pharmacy.

## 2016-07-30 NOTE — Telephone Encounter (Signed)
Pt. Called requesting to speak with her nurse regarding her metfomin medication. Please f/u

## 2016-08-10 ENCOUNTER — Ambulatory Visit: Payer: Self-pay | Admitting: Family Medicine

## 2016-08-19 MED FILL — ?AMLODIPINE BESYLATE 10 MG: 10 | 30 days supply | Qty: 30 | Fill #9

## 2016-08-19 MED FILL — ALLOPURINOL 100 MG TABLET: 100 | 30 days supply | Qty: 60 | Fill #6

## 2016-08-19 MED FILL — VENLAFAXINE HCL ER 37.5 MG: 37.5 | 35 days supply | Qty: 60 | Fill #2

## 2016-08-19 MED FILL — PANTOPRAZOLE SOD DR 20 MG T: 20 | 30 days supply | Qty: 60 | Fill #8

## 2016-08-20 MED FILL — ?METFORMIN HCL 500MG TABLET: 500 | 30 days supply | Qty: 60 | Fill #1

## 2016-09-03 ENCOUNTER — Ambulatory Visit: Payer: Self-pay | Admitting: Family Medicine

## 2016-09-13 ENCOUNTER — Ambulatory Visit: Payer: Self-pay | Attending: Family Medicine | Admitting: Family Medicine

## 2016-09-13 ENCOUNTER — Encounter: Payer: Self-pay | Admitting: Family Medicine

## 2016-09-13 VITALS — BP 138/87 | HR 103 | Temp 98.3°F | Ht 62.0 in | Wt 193.2 lb

## 2016-09-13 DIAGNOSIS — E119 Type 2 diabetes mellitus without complications: Secondary | ICD-10-CM | POA: Insufficient documentation

## 2016-09-13 DIAGNOSIS — Z87891 Personal history of nicotine dependence: Secondary | ICD-10-CM | POA: Insufficient documentation

## 2016-09-13 DIAGNOSIS — Z79899 Other long term (current) drug therapy: Secondary | ICD-10-CM | POA: Insufficient documentation

## 2016-09-13 DIAGNOSIS — F1029 Alcohol dependence with unspecified alcohol-induced disorder: Secondary | ICD-10-CM

## 2016-09-13 DIAGNOSIS — Z7982 Long term (current) use of aspirin: Secondary | ICD-10-CM | POA: Insufficient documentation

## 2016-09-13 DIAGNOSIS — Z7984 Long term (current) use of oral hypoglycemic drugs: Secondary | ICD-10-CM | POA: Insufficient documentation

## 2016-09-13 DIAGNOSIS — N951 Menopausal and female climacteric states: Secondary | ICD-10-CM | POA: Insufficient documentation

## 2016-09-13 LAB — POCT GLYCOSYLATED HEMOGLOBIN (HGB A1C): HEMOGLOBIN A1C: 7.5

## 2016-09-13 LAB — GLUCOSE, POCT (MANUAL RESULT ENTRY): POC Glucose: 123 mg/dl — AB (ref 70–99)

## 2016-09-13 MED ORDER — METFORMIN HCL 500 MG PO TABS: 500.0000 mg | ORAL_TABLET | Freq: Two times a day (BID) | ORAL | 2 refills | Status: DC

## 2016-09-13 MED FILL — ?METFORMIN HCL 500MG TABLET: 500 | 30 days supply | Qty: 60 | Fill #0

## 2016-09-13 NOTE — Progress Notes (Signed)
Subjective:  Patient ID: Christina Byrd, female    DOB: 1965-11-21  Age: 51 y.o. MRN: [id number]  CC: Hot Flashes and Diabetes   HPI Christina Byrd presents for   1. Hot flashes: she did not start Effexor due to concern about side effects. At the last visit she was found to have elevated A1c of 7.5. She was started on metformin. She reports hot flashes occur rarely. She reports 1-2 in a day and none for several weeks.   2. New onset diabetes: she reports drinking 40 oz of beer per day after work. She is taking metformin 500 mg BID. She does drink 1 soda per day, Sprite Zero after a meal. She denies family history of diabetes.    Social History  Substance Use Topics  . Smoking status: Former Smoker    Quit date: 06/25/2012  . Smokeless tobacco: Never Used  . Alcohol use 1.5 oz/week    3 Standard drinks or equivalent per week     Comment: ADMITS TO DRINKING 3-4 BEERS/DAY    Outpatient Medications Prior to Visit  Medication Sig Dispense Refill  . allopurinol (ZYLOPRIM) 100 MG tablet Take 200 mg by mouth daily.    Marland Kitchen amLODipine (NORVASC) 10 MG tablet Take 1 tablet (10 mg total) by mouth daily. 90 tablet 3  . aspirin EC 81 MG tablet Take 1 tablet (81 mg total) by mouth daily.    . benzonatate (TESSALON) 100 MG capsule Take 100 mg by mouth 3 (three) times daily as needed for cough.    . diclofenac (VOLTAREN) 75 MG EC tablet Take 75 mg by mouth 2 (two) times daily as needed for mild pain.    Marland Kitchen ketoconazole (NIZORAL) 2 % cream Apply 1 application topically 2 (two) times daily. Under breast for 2-4 weeks 60 g 0  . loratadine (CLARITIN) 10 MG tablet Take 10 mg by mouth daily.    . metFORMIN (GLUCOPHAGE) 500 MG tablet Take 1 tablet (500 mg total) by mouth 2 (two) times daily with a meal. 60 tablet 2  . pantoprazole (PROTONIX) 20 MG tablet TAKE 1 TABLET BY MOUTH 2 TIMES DAILY BEFORE A MEAL 180 tablet 3  . venlafaxine XR (EFFEXOR XR) 37.5 MG 24 hr capsule Take Effexor 37.5 mg daily for  one week, then 75 mg daily (Patient taking differently: Take 75 mg by mouth daily. ) 60 capsule 2   No facility-administered medications prior to visit.     ROS Review of Systems  Constitutional: Negative for chills and fever.  Eyes: Negative for visual disturbance.  Respiratory: Negative for shortness of breath.   Cardiovascular: Negative for chest pain.  Gastrointestinal: Positive for nausea. Negative for abdominal pain and blood in stool.  Musculoskeletal: Positive for arthralgias. Negative for back pain.  Skin: Negative for rash.  Allergic/Immunologic: Negative for immunocompromised state.  Hematological: Negative for adenopathy. Does not bruise/bleed easily.  Psychiatric/Behavioral: Negative for dysphoric mood and suicidal ideas.    Objective:  BP 138/87   Pulse (!) 103   Temp 98.3 F (36.8 C) (Oral)   Ht 5\' 2"  (1.575 m)   Wt 193 lb 3.2 oz (87.6 kg)   LMP 12/17/2014   SpO2 94%   BMI 35.34 kg/m   BP/Weight 09/13/2016 07/23/2016 07/09/2016  Systolic BP 138 117 107  Diastolic BP 87 79 72  Wt. (Lbs) 193.2 195.6 -  BMI 35.34 38.2 -    Physical Exam  Constitutional: She is oriented to person, place, and time. She  appears well-developed and well-nourished. No distress.  HENT:  Head: Normocephalic and atraumatic.  Right Ear: Tympanic membrane, external ear and ear canal normal.  Left Ear: Tympanic membrane and external ear normal.  Mouth/Throat: Oropharynx is clear and moist.  Eyes: Pupils are equal, round, and reactive to light. Conjunctivae and EOM are normal.  Neck: Normal range of motion. Neck supple.  Cardiovascular: Normal rate, regular rhythm, normal heart sounds and intact distal pulses.   Pulmonary/Chest: Effort normal and breath sounds normal.  Musculoskeletal: She exhibits no edema.  Lymphadenopathy:    She has no cervical adenopathy.  Neurological: She is alert and oriented to person, place, and time.  Skin: Skin is warm and dry. No rash noted.  Psychiatric:  She has a normal mood and affect.    Lab Results  Component Value Date   HGBA1C 7.5 07/23/2016   CBG 123  Lab Results  Component Value Date   HGBA1C 7.5 07/23/2016    Assessment & Plan:  Christina Byrd was seen today for hot flashes and diabetes.  Diagnoses and all orders for this visit:  New onset type 2 diabetes mellitus (HCC) -     Glucose (CBG) -     HgB A1c -     metFORMIN (GLUCOPHAGE) 500 MG tablet; Take 1 tablet (500 mg total) by mouth 2 (two) times daily with a meal.   There are no diagnoses linked to this encounter.  No orders of the defined types were placed in this encounter.   Follow-up: Return in about 3 months (around 12/14/2016) for diabetes.   Dessa Phi MD

## 2016-09-13 NOTE — Assessment & Plan Note (Signed)
Improved D/c Effexor as patient refuses to take it

## 2016-09-13 NOTE — Assessment & Plan Note (Signed)
Patient counseled to cut down

## 2016-09-13 NOTE — Patient Instructions (Addendum)
[name] was seen today for hot flashes and diabetes.  Diagnoses and all orders for this visit:  New onset type 2 diabetes mellitus (HCC) -     Glucose (CBG) -     HgB A1c -     metFORMIN (GLUCOPHAGE) 500 MG tablet; Take 1 tablet (500 mg total) by mouth 2 (two) times daily with a meal.   Increase exercise to bike 10-15 minutes twice a day Decrease carbohydrate intake by decreasing beer to 8-16 oz per evening and exercise after  F/u in 3 months for sugar check for new onset diabetes   Dr. Armen Pickup

## 2016-09-13 NOTE — Assessment & Plan Note (Signed)
New onset type 2 diabetes in setting of alcohol dependence Plan: Metformin Decreased ETOH

## 2016-09-13 NOTE — Assessment & Plan Note (Signed)
>>  ASSESSMENT AND PLAN FOR ALCOHOL USE DISORDER WRITTEN ON 09/13/2016  3:51 PM BY Dessa Phi, MD  Patient counseled to cut down

## 2016-09-17 MED FILL — PANTOPRAZOLE SOD DR 20 MG T: 20 | 30 days supply | Qty: 60 | Fill #9

## 2016-09-17 MED FILL — ?METFORMIN HCL 500MG TABLET: 500 | 30 days supply | Qty: 60 | Fill #2

## 2016-09-17 MED FILL — ALLOPURINOL 100 MG TABLET: 100 | 30 days supply | Qty: 60 | Fill #7

## 2016-09-17 MED FILL — AMLODIPINE BESYLATE 10 MG T: 10 | 30 days supply | Qty: 30 | Fill #10

## 2016-10-04 ENCOUNTER — Telehealth: Payer: Self-pay | Admitting: Family Medicine

## 2016-10-04 NOTE — Telephone Encounter (Signed)
Patient came into office requesting advise , pt wants to start taking Amberen , and wants to know if this is a good option for her. Please f/up

## 2016-10-05 NOTE — Telephone Encounter (Signed)
Return call to patient regarding medication question. Left message on voicemail to return call.

## 2016-10-07 NOTE — Telephone Encounter (Signed)
Left message on voicemail to return call regarding question to  Medication.

## 2016-10-13 ENCOUNTER — Ambulatory Visit: Payer: Self-pay | Attending: Internal Medicine

## 2016-10-13 NOTE — Telephone Encounter (Signed)
Pt came in the office to get advise on OTC medication: Amberen to help with hot flashes. She states she did not want to take prescription given by Dr. Armen Pickup d/t the side effects. Informed that her PCP is no longer here. Message will be routed to provider she has next appointment with for medication advise. Informed would need to establish care.

## 2016-10-15 ENCOUNTER — Other Ambulatory Visit: Payer: Self-pay | Admitting: Family Medicine

## 2016-10-15 DIAGNOSIS — K219 Gastro-esophageal reflux disease without esophagitis: Secondary | ICD-10-CM

## 2016-10-15 DIAGNOSIS — I10 Essential (primary) hypertension: Secondary | ICD-10-CM

## 2016-10-15 MED FILL — ALLOPURINOL 100 MG TABLET: 100 | 30 days supply | Qty: 60 | Fill #8

## 2016-10-15 MED FILL — PANTOPRAZOLE SOD DR 20 MG T: 20 | 30 days supply | Qty: 60 | Fill #0

## 2016-10-15 MED FILL — AMLODIPINE BESYLATE 10 MG T: 10 | 30 days supply | Qty: 30 | Fill #0

## 2016-10-18 NOTE — Telephone Encounter (Signed)
Pt aware of recommendations.  She adds that she has had a cough since last Thursday.  She believes she got virus from neighbor and children. Recommended patient to drink plenty of water to loose up secretions and take benzonatate that was prescribed and call office if not better by Thursday.  Denies SOB or chest discomfort, fever.  Pt verbalized understanding.

## 2016-10-18 NOTE — Telephone Encounter (Signed)
If it is an OTC supplement I would not  advise because it may not be evaluated by the FDA, and could possibly interfere with current medications. If she decides to take medication it will be at her own risk.

## 2016-11-17 MED FILL — AMLODIPINE BESYLATE 10 MG T: 10 | 30 days supply | Qty: 30 | Fill #1

## 2016-11-17 MED FILL — ?METFORMIN HCL 500MG TABLET: 500 | 30 days supply | Qty: 60 | Fill #1

## 2016-11-17 MED FILL — ALLOPURINOL 100 MG TABLET: 100 | 30 days supply | Qty: 60 | Fill #9

## 2016-11-17 MED FILL — PANTOPRAZOLE SOD DR 20 MG T: 20 | 30 days supply | Qty: 60 | Fill #1

## 2016-12-07 NOTE — Progress Notes (Signed)
Primary PCP: [facility]  HPI: Christina Byrd is a 51 y.o.  patient with  a history of HTN, tobacco abuse, GERD and chronic systolic HF.   ECHO 09/2012 showed EF 20-25% with moderate MR.  Was seen by me on 01/26/13 and was given samples of benicar/HCTZ 40/12.5 mg and was scheduled for [facility] in January 2015. But had no insurance and never done . Was on Cozaar however stopped d/t facial pruritis. Developed cough with ACE-I.   She returns for follow up. 03/2014 started on coreg  6.25 mg bid. She stopped taking hydralazine and Imdur due to headaches. Complains of increased cough and fatigue.  . She has not been taking hydralazine/Imdur due to headache. Not weighing at home.   ECHO 03/15/13 EF 45-50% Last echo 03/08/16 EF 55-60%  She learned her mothers brother (uncle) died today. She was a bit emotional about it And her mom is having AICD put in tomorrow   SH: Quit smoking [id number] . Drinks a beer a night and occasional shots of liquor. 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   ROS: All systems negative except as listed in HPI, PMH and Problem List.  Past Medical History:  Diagnosis Date  . GASTROESOPHAGEAL REFLUX, NO ESOPHAGITIS 04/21/2006   Qualifier: Diagnosis of  By: Levada Schilling    . GERD (gastroesophageal reflux disease) Dx 1995  . HTN (hypertension)     Current Outpatient Prescriptions  Medication Sig Dispense Refill  . allopurinol (ZYLOPRIM) 100 MG tablet Take 200 mg by mouth daily.    Marland Kitchen amLODipine (NORVASC) 10 MG tablet TAKE 1 TABLET BY MOUTH DAILY. 90 tablet 0  . aspirin EC 81 MG tablet Take 1 tablet (81 mg total) by mouth daily.    . benzonatate (TESSALON) 100 MG capsule Take 100 mg by mouth 3 (three) times daily as needed for cough.    . diclofenac (VOLTAREN) 75 MG EC tablet Take 75 mg by mouth 2 (two) times daily as needed for mild pain.    Marland Kitchen ketoconazole (NIZORAL) 2 % cream Apply 1 application topically 2 (two) times daily. Under breast for 2-4 weeks 60 g 0  .  loratadine (CLARITIN) 10 MG tablet Take 10 mg by mouth daily as needed.     . metFORMIN (GLUCOPHAGE) 500 MG tablet Take 1 tablet (500 mg total) by mouth 2 (two) times daily with a meal. 60 tablet 2  . pantoprazole (PROTONIX) 20 MG tablet TAKE 1 TABLET BY MOUTH 2 TIMES DAILY BEFORE A MEAL 180 tablet 0   No current facility-administered medications for this visit.     Vitals:   12/15/16 1426  BP: 126/76  Pulse: (!) 107  SpO2: 99%  Weight: 198 lb (89.8 kg)  Height: 5\' 2"  (1.575 m)    PHYSICAL EXAM: General:  Well appearing. No resp difficulty HEENT: normal Neck: supple. JVP 5-6  Carotids 2+ bilaterally; no bruits. No lymphadenopathy or thryomegaly appreciated. Cor: PMI normal. Regular rate & rhythm. No rubs, gallops or murmurs. Lungs: clear Abdomen: soft, nontender, nondistended. No hepatosplenomegaly. No bruits or masses. Good bowel sounds. Extremities: no cyanosis, clubbing, rash, edema Neuro: alert & orientedx3, cranial nerves grossly intact. Moves all 4 extremities w/o difficulty. Affect pleasant.  ECG:  11/30/12  SR rate 74 LVH  No  Old MI   2.25/16  SR rate 91 normal  11/20/15  SR rate 88 normal ECG   ASSESSMENT & PLAN:  1) Chronic systolic HF: Previous  EF 20-25% (09/2012) Etiology of HF unclear, possibly  related to long standing HTN.  EF normal echo 03/08/16 55-60%   Will continue HCTX 25 mg daily. Continue 6.25 mg carvedilol twice a day.     Off hydralazine/Imdur due to headache Not on an ace due to cough.  Not on losartan due to rash.   Reinforced low salt food choices, limiting fluid intake to < 2 liters per day, and medication compliance.   2) HTN- Improved  Hydralazine/Imdur stopped due to headache. Continue current dose of carvedilol and HCTZ. Improved with amlodipine   3) Primary :  Flu shot given today   F/U with me in a year    Charlton Haws

## 2016-12-15 ENCOUNTER — Ambulatory Visit (INDEPENDENT_AMBULATORY_CARE_PROVIDER_SITE_OTHER): Payer: Self-pay | Admitting: Cardiovascular Disease

## 2016-12-15 ENCOUNTER — Ambulatory Visit: Payer: Self-pay | Admitting: Family Medicine

## 2016-12-15 VITALS — BP 126/76 | HR 107 | Ht 62.0 in | Wt 198.0 lb

## 2016-12-15 DIAGNOSIS — Z23 Encounter for immunization: Secondary | ICD-10-CM

## 2016-12-15 DIAGNOSIS — I428 Other cardiomyopathies: Secondary | ICD-10-CM

## 2016-12-15 NOTE — Patient Instructions (Addendum)

## 2016-12-20 MED FILL — AMLODIPINE BESYLATE 10 MG T: 10 | 30 days supply | Qty: 30 | Fill #2

## 2016-12-20 MED FILL — PANTOPRAZOLE SOD DR 20 MG T: 20 | 30 days supply | Qty: 60 | Fill #2

## 2016-12-20 MED FILL — metFORMIN HCL 500 MG TABS: 500 | 30 days supply | Qty: 60 | Fill #2

## 2016-12-23 ENCOUNTER — Ambulatory Visit: Payer: Self-pay | Attending: Family Medicine | Admitting: Family Medicine

## 2016-12-23 ENCOUNTER — Encounter: Payer: Self-pay | Admitting: Family Medicine

## 2016-12-23 VITALS — BP 125/82 | HR 98 | Temp 99.0°F | Resp 18 | Ht 62.0 in | Wt 197.2 lb

## 2016-12-23 DIAGNOSIS — Z6836 Body mass index (BMI) 36.0-36.9, adult: Secondary | ICD-10-CM | POA: Insufficient documentation

## 2016-12-23 DIAGNOSIS — B353 Tinea pedis: Secondary | ICD-10-CM | POA: Insufficient documentation

## 2016-12-23 DIAGNOSIS — I10 Essential (primary) hypertension: Secondary | ICD-10-CM | POA: Insufficient documentation

## 2016-12-23 DIAGNOSIS — Z87891 Personal history of nicotine dependence: Secondary | ICD-10-CM | POA: Insufficient documentation

## 2016-12-23 DIAGNOSIS — R635 Abnormal weight gain: Secondary | ICD-10-CM | POA: Insufficient documentation

## 2016-12-23 DIAGNOSIS — R5383 Other fatigue: Secondary | ICD-10-CM | POA: Insufficient documentation

## 2016-12-23 DIAGNOSIS — Z7982 Long term (current) use of aspirin: Secondary | ICD-10-CM | POA: Insufficient documentation

## 2016-12-23 DIAGNOSIS — E119 Type 2 diabetes mellitus without complications: Secondary | ICD-10-CM | POA: Insufficient documentation

## 2016-12-23 DIAGNOSIS — Z79899 Other long term (current) drug therapy: Secondary | ICD-10-CM | POA: Insufficient documentation

## 2016-12-23 DIAGNOSIS — R200 Anesthesia of skin: Secondary | ICD-10-CM | POA: Insufficient documentation

## 2016-12-23 DIAGNOSIS — Z7984 Long term (current) use of oral hypoglycemic drugs: Secondary | ICD-10-CM | POA: Insufficient documentation

## 2016-12-23 LAB — GLUCOSE, POCT (MANUAL RESULT ENTRY): POC GLUCOSE: 180 mg/dL — AB (ref 70–99)

## 2016-12-23 LAB — POCT GLYCOSYLATED HEMOGLOBIN (HGB A1C): HEMOGLOBIN A1C: 7

## 2016-12-23 MED ORDER — GABAPENTIN 300 MG PO CAPS: 300.0000 mg | ORAL_CAPSULE | Freq: Every day | ORAL | 2 refills | Status: DC

## 2016-12-23 MED ORDER — AMLODIPINE BESYLATE 10 MG PO TABS: 10.0000 mg | ORAL_TABLET | Freq: Every day | ORAL | 1 refills | Status: DC

## 2016-12-23 MED ORDER — METFORMIN HCL 500 MG PO TABS: 500.0000 mg | ORAL_TABLET | Freq: Two times a day (BID) | ORAL | 5 refills | Status: DC

## 2016-12-23 MED ORDER — TERBINAFINE HCL 1 % EX CREA: 1.0000 | TOPICAL_CREAM | Freq: Two times a day (BID) | CUTANEOUS | 0 refills | Status: DC

## 2016-12-23 MED FILL — GABAPENTIN 300 MG CAPSULE: 300 | 30 days supply | Qty: 30 | Fill #0

## 2016-12-23 NOTE — Patient Instructions (Signed)
Athlete's Foot Athlete's foot (tinea pedis) is a fungal infection of the skin on the feet. It often occurs on the skin that is between or underneath the toes. It can also occur on the soles of the feet. The infection can spread from person to person (is contagious). Follow these instructions at home:  Apply or take over-the-counter and prescription medicines only as told by your doctor.  Keep all follow-up visits as told by your doctor. This is important.  Do not scratch your feet.  Keep your feet dry: ? Wear cotton or wool socks. Change your socks every day or if they become wet. ? Wear shoes that allow air to move around, such as sandals or canvas tennis shoes.  Wash and dry your feet: ? Every day or as told by your doctor. ? After exercising. ? Including the area between your toes.  Wear sandals in wet areas, such as locker rooms and shared showers.  Do not share any of these items: ? Towels. ? Nail clippers. ? Other personal items that touch your feet.  If you have diabetes, keep your blood sugar under control. Contact a doctor if:  You have a fever.  You have swelling, soreness, warmth, or redness in your foot.  You are not getting better with treatment.  Your symptoms get worse.  You have new symptoms. This information is not intended to replace advice given to you by your health care provider. Make sure you discuss any questions you have with your health care provider. Document Released: 07/28/2007 Document Revised: 07/17/2015 Document Reviewed: 08/12/2014 Elsevier Interactive Patient Education  2018 Elsevier Inc.  

## 2016-12-23 NOTE — Progress Notes (Signed)
Subjective:  Patient ID: Christina Byrd, female    DOB: 09-05-1965  Age: 51 y.o. MRN: [id number]  CC: Diabetes   HPI Christina SouConstance L Glasco presents to establish care. PMH include DM, HTN.  Symptoms: parathesias of the BLE.Marland Kitchen. Symptoms have gradually worsened. She reports symptoms of numbness that occurs at night when sleeping.  Patient denies hyperglycemia, nausea, visual disturbances, vomitting and weight loss.  Evaluation to date has been included: fasting blood sugar and hemoglobin A1C.  Home sugars: patient does not check sugars. Treatment to date: metformin. History of HTN. She is not exercising and is not adherent to low salt diet. She does not check at home. Cardiac symptoms none. Patient denies chest pain, chest pressure/discomfort, claudication, dyspnea, lower extremity edema, near-syncope, palpitations and syncope.  Cardiovascular risk factors: diabetes mellitus, hypertension, sedentary lifestyle and former smoker.. Use of agents associated with hypertension: none. History of target organ damage: none. Fatigue: 4 months ago. Denies any weight loss, hematochezia, melena. She reporst weight gain over the last 7 months. She denies any recent stressors. She does reports fatigue with hot flashes. She is postmenopausal. She reports options given to help symptoms in the past but concern over possible side effects.    Outpatient Medications Prior to Visit  Medication Sig Dispense Refill  . allopurinol (ZYLOPRIM) 100 MG tablet Take 200 mg by mouth daily.    Marland Kitchen. aspirin EC 81 MG tablet Take 1 tablet (81 mg total) by mouth daily.    . benzonatate (TESSALON) 100 MG capsule Take 100 mg by mouth 3 (three) times daily as needed for cough.    . diclofenac (VOLTAREN) 75 MG EC tablet Take 75 mg by mouth 2 (two) times daily as needed for mild pain.    Marland Kitchen. ketoconazole (NIZORAL) 2 % cream Apply 1 application topically 2 (two) times daily. Under breast for 2-4 weeks 60 g 0  . loratadine (CLARITIN) 10 MG tablet  Take 10 mg by mouth daily as needed.     . pantoprazole (PROTONIX) 20 MG tablet TAKE 1 TABLET BY MOUTH 2 TIMES DAILY BEFORE A MEAL 180 tablet 0  . amLODipine (NORVASC) 10 MG tablet TAKE 1 TABLET BY MOUTH DAILY. 90 tablet 0  . metFORMIN (GLUCOPHAGE) 500 MG tablet Take 1 tablet (500 mg total) by mouth 2 (two) times daily with a meal. 60 tablet 2   No facility-administered medications prior to visit.     ROS Review of Systems  Constitutional: Positive for fatigue and unexpected weight change (weight gain).  Respiratory: Negative.   Cardiovascular: Negative.   Gastrointestinal: Negative.   Skin: Negative.   Neurological: Positive for numbness.    Objective:  BP 125/82 (BP Location: Left Arm, Patient Position: Sitting, Cuff Size: Normal)   Pulse 98   Temp 99 F (37.2 C) (Oral)   Resp 18   Ht 5\' 2"  (1.575 m)   Wt 197 lb 3.2 oz (89.4 kg)   LMP 12/17/2014   SpO2 96%   BMI 36.07 kg/m   BP/Weight 12/23/2016 12/15/2016 09/13/2016  Systolic BP 125 126 138  Diastolic BP 82 76 87  Wt. (Lbs) 197.2 198 193.2  BMI 36.07 36.21 35.34     Physical Exam  Constitutional: She appears well-developed and well-nourished.  Eyes: Conjunctivae are normal. Pupils are equal, round, and reactive to light.  Neck: Normal range of motion. Neck supple.  Cardiovascular: Normal rate, regular rhythm, normal heart sounds and intact distal pulses.  Pulmonary/Chest: Effort normal and breath sounds normal.  Abdominal:  Soft. Bowel sounds are normal. There is no tenderness.  Skin: Skin is warm and dry. Rash (Rash to bilateral plantar surface and intertriginous areas of toes) noted.  Nursing note and vitals reviewed.  Diabetic Foot Exam - Simple   Simple Foot Form Diabetic Foot exam was performed with the following findings:  Yes 12/23/2016  4:00 PM  Visual Inspection No deformities, no ulcerations, no other skin breakdown bilaterally:  Yes See comments:  Yes Sensation Testing Intact to touch and monofilament  testing bilaterally:  Yes Pulse Check Posterior Tibialis and Dorsalis pulse intact bilaterally:  Yes Comments Rash to bilateral plantar surface and intertriginous areas of toes.       Assessment & Plan:   1. Controlled type 2 diabetes mellitus without complication, without long-term current use of insulin (HCC)  - Glucose (CBG) - HgB A1c - metFORMIN (GLUCOPHAGE) 500 MG tablet; Take 1 tablet (500 mg total) by mouth 2 (two) times daily with a meal.  Dispense: 60 tablet; Refill: 5  2. Tinea pedis of both feet  - terbinafine (LAMISIL AT) 1 % cream; Apply 1 application topically 2 (two) times daily.  Dispense: 30 g; Refill: 0  3. Fatigue, unspecified type  - TSH  4. Leg numbness  - POCT ABI Screening for Pilot No Charge  5. HYPERTENSION, BENIGN ESSENTIAL  - amLODipine (NORVASC) 10 MG tablet; Take 1 tablet (10 mg total) by mouth daily.  Dispense: 90 tablet; Refill: 1  6. New onset type 2 diabetes mellitus (HCC)  - metFORMIN (GLUCOPHAGE) 500 MG tablet; Take 1 tablet (500 mg total) by mouth 2 (two) times daily with a meal.  Dispense: 60 tablet; Refill: 5  7. Weight gain  - TSH     Follow-up: Return in about 3 months (around 03/25/2017), or if symptoms worsen or fail to improve, for DM.   Lizbeth Bark FNP

## 2016-12-23 NOTE — Progress Notes (Signed)
Patient complains hot flashes

## 2016-12-24 LAB — TSH: TSH: 1.26 u[IU]/mL (ref 0.450–4.500)

## 2017-01-17 ENCOUNTER — Telehealth: Payer: Self-pay

## 2017-01-17 NOTE — Telephone Encounter (Signed)
-----   Message from Lizbeth Bark, FNP sent at 01/12/2017  1:26 PM EST ----- Thyroid function normal. When thyroid function is decreased it can cause weight gain.

## 2017-01-17 NOTE — Telephone Encounter (Signed)
CMA call regarding lab results  Patient did not answer but left a detailed message per DPR if have any questions just to call back  

## 2017-01-18 ENCOUNTER — Other Ambulatory Visit: Payer: Self-pay | Admitting: Internal Medicine

## 2017-01-18 DIAGNOSIS — K219 Gastro-esophageal reflux disease without esophagitis: Secondary | ICD-10-CM

## 2017-01-18 MED FILL — ?METFORMIN HCL 500MG TABLET: 500 | 30 days supply | Qty: 60 | Fill #0

## 2017-01-18 MED FILL — GABAPENTIN 300 MG CAPSULE: 300 | 30 days supply | Qty: 30 | Fill #1

## 2017-01-18 MED FILL — PANTOPRAZOLE SOD DR 20 MG T: 20 | 30 days supply | Qty: 60 | Fill #0

## 2017-01-18 MED FILL — AMLODIPINE BESYLATE 10 MG T: 10 | 30 days supply | Qty: 30 | Fill #0

## 2017-01-19 ENCOUNTER — Ambulatory Visit: Payer: Self-pay | Attending: Family Medicine | Admitting: Physician Assistant

## 2017-01-19 VITALS — BP 105/73 | HR 95 | Temp 98.4°F | Resp 20 | Wt 196.6 lb

## 2017-01-19 DIAGNOSIS — Z881 Allergy status to other antibiotic agents status: Secondary | ICD-10-CM | POA: Insufficient documentation

## 2017-01-19 DIAGNOSIS — Z79899 Other long term (current) drug therapy: Secondary | ICD-10-CM | POA: Insufficient documentation

## 2017-01-19 DIAGNOSIS — Z885 Allergy status to narcotic agent status: Secondary | ICD-10-CM | POA: Insufficient documentation

## 2017-01-19 DIAGNOSIS — J40 Bronchitis, not specified as acute or chronic: Secondary | ICD-10-CM | POA: Insufficient documentation

## 2017-01-19 DIAGNOSIS — I10 Essential (primary) hypertension: Secondary | ICD-10-CM | POA: Insufficient documentation

## 2017-01-19 DIAGNOSIS — Z888 Allergy status to other drugs, medicaments and biological substances status: Secondary | ICD-10-CM | POA: Insufficient documentation

## 2017-01-19 DIAGNOSIS — E119 Type 2 diabetes mellitus without complications: Secondary | ICD-10-CM | POA: Insufficient documentation

## 2017-01-19 DIAGNOSIS — Z7982 Long term (current) use of aspirin: Secondary | ICD-10-CM | POA: Insufficient documentation

## 2017-01-19 DIAGNOSIS — K219 Gastro-esophageal reflux disease without esophagitis: Secondary | ICD-10-CM | POA: Insufficient documentation

## 2017-01-19 DIAGNOSIS — Z7984 Long term (current) use of oral hypoglycemic drugs: Secondary | ICD-10-CM | POA: Insufficient documentation

## 2017-01-19 LAB — GLUCOSE, POCT (MANUAL RESULT ENTRY): POC Glucose: 185 mg/dl — AB (ref 70–99)

## 2017-01-19 MED ORDER — BENZONATATE 100 MG PO CAPS: 200.0000 mg | ORAL_CAPSULE | Freq: Two times a day (BID) | ORAL | 0 refills | Status: DC | PRN

## 2017-01-19 MED ORDER — AZITHROMYCIN 250 MG PO TABS: ORAL_TABLET | ORAL | 0 refills | Status: DC

## 2017-01-19 MED ORDER — DICLOFENAC SODIUM 75 MG PO TBEC: 75.0000 mg | DELAYED_RELEASE_TABLET | Freq: Two times a day (BID) | ORAL | 1 refills | Status: DC | PRN

## 2017-01-19 MED ORDER — FLUCONAZOLE 150 MG PO TABS: 150.0000 mg | ORAL_TABLET | Freq: Once | ORAL | 0 refills | Status: AC

## 2017-01-19 MED FILL — FLUCONAZOLE 150 MG TABLET: 150 | 1 days supply | Qty: 1 | Fill #0

## 2017-01-19 MED FILL — ?DICLOFENAC SOD DR 75 MG TA: 75 | 30 days supply | Qty: 60 | Fill #0

## 2017-01-19 MED FILL — AZITHROMYCIN 250 MG TABLET: 250 | 5 days supply | Qty: 6 | Fill #0

## 2017-01-19 MED FILL — BENZONATATE 100 MG CAPSULE: 100 | 10 days supply | Qty: 40 | Fill #0

## 2017-01-19 NOTE — Progress Notes (Signed)
Patient ID: Christina Byrd, female   DOB: 05-24-1965, 51 y.o.   MRN: [id number]   Christina Byrd, is a 51 y.o. female  [id number]:[id number]  [id number]  DOB - 03-09-1965  Subjective:  Chief Complaint and HPI: Christina Byrd is a 51 y.o. female here today with 2 day h/o cough productive of green phlegm(she coughs some up while I am in the room-cough is harsh and sputum is thick and green).  No f/c.  Cough got worse 2 days ago after being exposed to cigarette smoke.  No runny nose/sneezing/sinus symptoms.    No hyper/hypoglycemia.  Also wants RF for Diclofenac for aches and pains to use as needed.    ROS:   Constitutional:  No f/c, No night sweats, No unexplained weight loss. EENT:  No vision changes, No blurry vision, No hearing changes. No mouth, throat, or ear problems.  Respiratory: +cough, No SOB Cardiac: No CP, no palpitations GI:  No abd pain, No N/V/D. GU: No Urinary s/sx Musculoskeletal: No joint pain Neuro: No headache, no dizziness, no motor weakness.  Skin: No rash Endocrine:  No polydipsia. No polyuria.  Psych: Denies SI/HI  No problems updated.  ALLERGIES: Allergies  Allergen Reactions  . Losartan Potassium     Rash  . Levaquin [Levofloxacin In D5w] Rash  . Ace Inhibitors Cough    REACTION: cough  . Codeine Nausea Only  . Cefepime Rash    09/04/12 pm Patient started to break out with small macules after IV Vanco infusion, then macules increased in size after starting cefepime infusion.  . Vancomycin Rash    09/04/12 pm Patient started to break out with small macules after IV Vanco infusion, then macules increased in size after starting cefepime infusion.    PAST MEDICAL HISTORY: Past Medical History:  Diagnosis Date  . GASTROESOPHAGEAL REFLUX, NO ESOPHAGITIS 04/21/2006   Qualifier: Diagnosis of  By: Levada Schilling    . GERD (gastroesophageal reflux disease) Dx 1995  . HTN (hypertension)     MEDICATIONS AT HOME: Prior to Admission medications     Medication Sig Start Date End Date Taking? Authorizing Provider  allopurinol (ZYLOPRIM) 100 MG tablet Take 200 mg by mouth daily.   Yes [provider]  amLODipine (NORVASC) 10 MG tablet Take 1 tablet (10 mg total) by mouth daily. 12/23/16  Yes Lizbeth Bark, FNP  aspirin EC 81 MG tablet Take 1 tablet (81 mg total) by mouth daily. 10/26/12  Yes Rai, Ripudeep K, MD  metFORMIN (GLUCOPHAGE) 500 MG tablet Take 1 tablet (500 mg total) by mouth 2 (two) times daily with a meal. 12/23/16  Yes Hairston, Mandesia R, FNP  pantoprazole (PROTONIX) 20 MG tablet TAKE 1 TABLET BY MOUTH 2 TIMES DAILY BEFORE A MEAL 01/18/17  Yes Lizbeth Bark, FNP  azithromycin (ZITHROMAX) 250 MG tablet 2 today then 1 daily 01/19/17   Georgian Co M, PA-C  benzonatate (TESSALON) 100 MG capsule Take 2 capsules (200 mg total) by mouth 2 (two) times daily as needed for cough. 01/19/17   Anders Simmonds, PA-C  diclofenac (VOLTAREN) 75 MG EC tablet Take 1 tablet (75 mg total) by mouth 2 (two) times daily as needed for mild pain. 01/19/17   Anders Simmonds, PA-C  fluconazole (DIFLUCAN) 150 MG tablet Take 1 tablet (150 mg total) by mouth once for 1 dose. If needed for yeast infection 01/19/17 01/19/17  Anders Simmonds, PA-C  gabapentin (NEURONTIN) 300 MG capsule Take 1 capsule (300 mg total) by mouth at  bedtime. Patient not taking: Reported on 01/19/2017 12/23/16   Lizbeth BarkHairston, Mandesia R, FNP     Objective:  EXAM:   Vitals:   01/19/17 1109  BP: 105/73  Pulse: 95  Resp: 20  Temp: 98.4 F (36.9 C)  TempSrc: Oral  SpO2: 97%  Weight: 196 lb 9.6 oz (89.2 kg)    General appearance : A&OX3. NAD. Non-toxic-appearing HEENT: Atraumatic and Normocephalic.  PERRLA. EOM intact.  TM clear B. Mouth-MMM, post pharynx WNL w/o erythema, No PND. Neck: supple, no JVD. No cervical lymphadenopathy. No thyromegaly Chest/Lungs:  Breathing-non-labored, Good air entry bilaterally, breath sounds overall normal without rales  or wheezing, but there is some minimal rhonchi at B lung bases with forced expiration  CVS: S1 S2 regular, no murmurs, gallops, rubs  Extremities: Bilateral Lower Ext shows no edema, both legs are warm to touch with = pulse throughout Neurology:  CN II-XII grossly intact, Non focal.   Psych:  TP linear. J/I WNL. Normal speech. Appropriate eye contact and affect.  Skin:  No Rash  Data Review Lab Results  Component Value Date   HGBA1C 7.0 12/23/2016   HGBA1C 7.5 09/13/2016   HGBA1C 7.5 07/23/2016     Assessment & Plan   1. Bronchitis Fluids, rest, URI care - benzonatate (TESSALON) 100 MG capsule; Take 2 capsules (200 mg total) by mouth 2 (two) times daily as needed for cough.  Dispense: 40 capsule; Refill: 0 - azithromycin (ZITHROMAX) 250 MG tablet; 2 today then 1 daily  Dispense: 6 tablet; Refill: 0 - fluconazole (DIFLUCAN) 150 MG tablet; Take 1 tablet (150 mg total) by mouth once for 1 dose. If needed for yeast infection  Dispense: 1 tablet; Refill: 0  2. New onset type 2 diabetes mellitus (HCC) uncontrolled.  Continue current regimen , but increase effort on dietary changes/following diabetic diet and exercise.   - Glucose (CBG)  Patient have been counseled extensively about nutrition and exercise  Return in about 2 months (around 03/25/2017) [facility].  The patient was given clear instructions to go to ER or return to medical center if symptoms don't improve, worsen or new problems develop. The patient verbalized understanding. The patient was told to call to get lab results if they haven't heard anything in the next week.     Georgian CoAngela McClung, PA-C [facility] [place], [place] [phone]   01/19/2017, 11:34 AM

## 2017-01-19 NOTE — Progress Notes (Signed)
Concerns with cough, productive Sx's onset 2 days Would like diclofenac for leg pain

## 2017-02-16 MED FILL — PANTOPRAZOLE SOD DR 20 MG T: 20 | 30 days supply | Qty: 60 | Fill #1

## 2017-02-16 MED FILL — ?METFORMIN HCL 500MG TABLET: 500 | 30 days supply | Qty: 60 | Fill #1

## 2017-02-16 MED FILL — ?DICLOFENAC SOD DR 75 MG TA: 75 | 30 days supply | Qty: 60 | Fill #1

## 2017-02-16 MED FILL — AMLODIPINE BESYLATE 10 MG T: 10 | 30 days supply | Qty: 30 | Fill #1

## 2017-03-14 MED FILL — AMLODIPINE BESYLATE 10 MG T: 10 | 30 days supply | Qty: 30 | Fill #2

## 2017-03-14 MED FILL — ?METFORMIN HCL 500MG TABLET: 500 | 30 days supply | Qty: 60 | Fill #2

## 2017-03-14 MED FILL — PANTOPRAZOLE SOD DR 20 MG T: 20 | 30 days supply | Qty: 60 | Fill #2

## 2017-03-25 ENCOUNTER — Encounter: Payer: Self-pay | Admitting: Family Medicine

## 2017-03-25 ENCOUNTER — Ambulatory Visit: Payer: Self-pay | Attending: Family Medicine | Admitting: Family Medicine

## 2017-03-25 VITALS — BP 134/89 | HR 92 | Temp 98.4°F | Resp 16 | Wt 193.2 lb

## 2017-03-25 DIAGNOSIS — Z7984 Long term (current) use of oral hypoglycemic drugs: Secondary | ICD-10-CM | POA: Insufficient documentation

## 2017-03-25 DIAGNOSIS — E119 Type 2 diabetes mellitus without complications: Secondary | ICD-10-CM | POA: Insufficient documentation

## 2017-03-25 DIAGNOSIS — B353 Tinea pedis: Secondary | ICD-10-CM | POA: Insufficient documentation

## 2017-03-25 DIAGNOSIS — Z7982 Long term (current) use of aspirin: Secondary | ICD-10-CM | POA: Insufficient documentation

## 2017-03-25 DIAGNOSIS — Z79899 Other long term (current) drug therapy: Secondary | ICD-10-CM | POA: Insufficient documentation

## 2017-03-25 DIAGNOSIS — Z87891 Personal history of nicotine dependence: Secondary | ICD-10-CM | POA: Insufficient documentation

## 2017-03-25 DIAGNOSIS — I10 Essential (primary) hypertension: Secondary | ICD-10-CM | POA: Insufficient documentation

## 2017-03-25 LAB — GLUCOSE, POCT (MANUAL RESULT ENTRY): POC GLUCOSE: 134 mg/dL — AB (ref 70–99)

## 2017-03-25 LAB — POCT UA - MICROALBUMIN
CREATININE, POC: 300 mg/dL
MICROALBUMIN (UR) POC: 150 mg/L

## 2017-03-25 LAB — POCT GLYCOSYLATED HEMOGLOBIN (HGB A1C): HEMOGLOBIN A1C: 6.2

## 2017-03-25 MED ORDER — TERBINAFINE HCL 1 % EX CREA: 1.0000 "application " | TOPICAL_CREAM | Freq: Two times a day (BID) | CUTANEOUS | 0 refills | Status: DC

## 2017-03-25 NOTE — Patient Instructions (Addendum)
[facility] or [facility] for opthalmology exams.    Type 2 Diabetes Mellitus, Self Care, Adult When you have type 2 diabetes (type 2 diabetes mellitus), you must keep your blood sugar (glucose) under control. You can do this with:  Nutrition.  Exercise.  Lifestyle changes.  Medicines or insulin, if needed.  Support from your doctors and others.  How do I manage my blood sugar?  Check your blood sugar level every day, as often as told.  Call your doctor if your blood sugar is above your goal numbers for 2 tests in a row.  Have your A1c (hemoglobin A1c) level checked at least two times a year. Have it checked more often if your doctor tells you to. Your doctor will set treatment goals for you. Generally, you should have these blood sugar levels:  Before meals (preprandial): 80-130 mg/dL (4.4-7.2 mmol/L).  After meals (postprandial): lower than 180 mg/dL (10 mmol/L).  A1c level: less than 7%.  What do I need to know about high blood sugar? High blood sugar is called hyperglycemia. Know the signs of high blood sugar. Signs may include:  Feeling: ? Thirsty. ? Hungry. ? Very tired.  Needing to pee (urinate) more than usual.  Blurry vision.  What do I need to know about low blood sugar? Low blood sugar is called hypoglycemia. This is when blood sugar is at or below 70 mg/dL (3.9 mmol/L). Symptoms may include:  Feeling: ? Hungry. ? Worried or nervous (anxious). ? Sweaty and clammy. ? Confused. ? Dizzy. ? Sleepy. ? Sick to your stomach (nauseous).  Having: ? A fast heartbeat (palpitations). ? A headache. ? A change in your vision. ? Jerky movements that you cannot control (seizure). ? Nightmares. ? Tingling or no feeling (numbness) around the mouth, lips, or tongue.  Having trouble with: ? Talking. ? Paying attention (concentrating). ? Moving (coordination). ? Sleeping.  Shaking.  Passing out (fainting).  Getting upset easily  (irritability).  Treating low blood sugar  To treat low blood sugar, eat or drink something sugary right away. If you can think clearly and swallow safely, follow the 15:15 rule:  Take 15 grams of a fast-acting carb (carbohydrate). Some fast-acting carbs are: ? 1 tube of glucose gel. ? 3 sugar tablets (glucose pills). ? 6-8 pieces of hard candy. ? 4 oz (120 mL) of fruit juice. ? 4 oz (120 mL) regular (not diet) soda.  Check your blood sugar 15 minutes after you take the carb.  If your blood sugar is still at or below 70 mg/dL (3.9 mmol/L), take 15 grams of a carb again.  If your blood sugar does not go above 70 mg/dL (3.9 mmol/L) after 3 tries, get help right away.  After your blood sugar goes back to normal, eat a meal or a snack within 1 hour.  Treating very low blood sugar If your blood sugar is at or below 54 mg/dL (3 mmol/L), you have very low blood sugar (severe hypoglycemia). This is an emergency. Do not wait to see if the symptoms will go away. Get medical help right away. Call your local emergency services (911 in the [place] Do not drive yourself to the hospital. If you have very low blood sugar and you cannot eat or drink, you may need a glucagon shot (injection). A family member or friend should learn how to check your blood sugar and how to give you a glucagon shot. Ask your doctor if you need to have a glucagon shot kit  at home. What else is important to manage my diabetes? Medicine Follow these instructions about insulin and diabetes medicines:  Take them as told by your doctor.  Adjust them as told by your doctor.  Do not run out of them.  Having diabetes can raise your risk for other long-term conditions. These include heart or kidney disease. Your doctor may prescribe medicines to help prevent problems from diabetes. Food   Make healthy food choices. These include: ? Chicken, fish, egg whites, and beans. ? Oats, whole wheat, bulgur, brown rice, quinoa, and  millet. ? Fresh fruits and vegetables. ? Low-fat dairy products. ? Nuts, avocado, olive oil, and canola oil.  Make a food plan with a specialist (dietitian).  Follow instructions from your doctor about what you cannot eat or drink.  Drink enough fluid to keep your pee (urine) clear or pale yellow.  Eat healthy snacks between healthy meals.  Keep track of carbs that you eat. Read food labels. Learn food serving sizes.  Follow your sick day plan when you cannot eat or drink normally. Make this plan with your doctor so it is ready to use. Activity  Exercise at least 3 times a week.  Do not go more than 2 days without exercising.  Talk with your doctor before you start a new exercise. Your doctor may need to adjust your insulin, medicines, or food. Lifestyle   Do not use any tobacco products. These include cigarettes, chewing tobacco, and e-cigarettes.If you need help quitting, ask your doctor.  Ask your doctor how much alcohol is safe for you.  Learn to deal with stress. If you need help with this, ask your doctor. Body care  Stay up to date with your shots (immunizations).  Have your eyes and feet checked by a doctor as often as told.  Check your skin and feet every day. Check for cuts, bruises, redness, blisters, or sores.  Brush your teeth and gums two times a day.  Floss at least one time a day.  Go to the dentist least one time every 6 months.  Stay at a healthy weight. General instructions   Take over-the-counter and prescription medicines only as told by your doctor.  Share your diabetes care plan with: ? Your work or school. ? People you live with.  Check your pee (urine) for ketones: ? When you are sick. ? As told by your doctor.  Carry a card or wear jewelry that says that you have diabetes.  Ask your doctor: ? Do I need to meet with a diabetes educator? ? Where can I find a support group for people with diabetes?  Keep all follow-up visits as  told by your doctor. This is important. Where to find more information: To learn more about diabetes, visit:  American Diabetes Association: www.diabetes.org  American Association of Diabetes Educators: www.diabeteseducator.org/patient-resources  This information is not intended to replace advice given to you by your health care provider. Make sure you discuss any questions you have with your health care provider. Document Released: 06/02/2015 Document Revised: 07/17/2015 Document Reviewed: 03/14/2015 Elsevier Interactive Patient Education  [employer].

## 2017-03-25 NOTE — Progress Notes (Signed)
Subjective:  Patient ID: Christina Byrd, female    DOB: [birth year]  Age: 52 y.o. MRN: [id number]  CC: Diabetes and Hypertension   HPI JORGA PERRICONE presents for dm and htn. Patient denies hyperglycemia, nausea, visual disturbances, vomitting and weight loss.  Evaluation to date has been included: fasting blood sugar and hemoglobin A1C.  Home sugars: patient does not check sugars. Treatment to date: metformin. History of HTN. She is exercising and is not adherent to low salt diet. She does not check at home. Cardiac symptoms none. Patient denies chest pain, chest pressure/discomfort, claudication, dyspnea, lower extremity edema, near-syncope, palpitations and syncope.  Cardiovascular risk factors: diabetes mellitus, hypertension, sedentary lifestyle and former smoker.. Use of agents associated with hypertension: none. History of target organ damage: none.   Outpatient Medications Prior to Visit  Medication Sig Dispense Refill  . amLODipine (NORVASC) 10 MG tablet Take 1 tablet (10 mg total) by mouth daily. 90 tablet 1  . aspirin EC 81 MG tablet Take 1 tablet (81 mg total) by mouth daily.    . metFORMIN (GLUCOPHAGE) 500 MG tablet Take 1 tablet (500 mg total) by mouth 2 (two) times daily with a meal. 60 tablet 5  . pantoprazole (PROTONIX) 20 MG tablet TAKE 1 TABLET BY MOUTH 2 TIMES DAILY BEFORE A MEAL 180 tablet 0  . allopurinol (ZYLOPRIM) 100 MG tablet Take 200 mg by mouth daily.    Marland Kitchen azithromycin (ZITHROMAX) 250 MG tablet 2 today then 1 daily (Patient not taking: Reported on 03/25/2017) 6 tablet 0  . benzonatate (TESSALON) 100 MG capsule Take 2 capsules (200 mg total) by mouth 2 (two) times daily as needed for cough. (Patient not taking: Reported on 03/25/2017) 40 capsule 0  . diclofenac (VOLTAREN) 75 MG EC tablet Take 1 tablet (75 mg total) by mouth 2 (two) times daily as needed for mild pain. (Patient not taking: Reported on 03/25/2017) 60 tablet 1  . gabapentin (NEURONTIN) 300 MG capsule  Take 1 capsule (300 mg total) by mouth at bedtime. (Patient not taking: Reported on 01/19/2017) 30 capsule 2   No facility-administered medications prior to visit.     ROS Review of Systems  Constitutional: Negative.   Respiratory: Negative.   Cardiovascular: Negative.   Gastrointestinal: Negative.   Endocrine: Negative.   Skin: Negative.   Neurological: Negative for numbness.  Psychiatric/Behavioral: Negative.     Objective:  BP 134/89   Pulse 92   Temp 98.4 F (36.9 C) (Oral)   Resp 16   Wt 193 lb 3.2 oz (87.6 kg)   LMP 12/17/2014   SpO2 97%   BMI 35.34 kg/m   BP/Weight 03/25/2017 01/19/2017 12/23/2016  Systolic BP 134 105 125  Diastolic BP 89 73 82  Wt. (Lbs) 193.2 196.6 197.2  BMI 35.34 35.96 36.07     Physical Exam  Constitutional: She appears well-developed and well-nourished.  Eyes: Conjunctivae are normal. Pupils are equal, round, and reactive to light.  Cardiovascular: Normal rate, regular rhythm, normal heart sounds and intact distal pulses.  Pulmonary/Chest: Effort normal and breath sounds normal.  Abdominal: Soft. Bowel sounds are normal. There is no tenderness.  Skin: Skin is warm and dry. Rash (Rash to bilateral plantar surface and intertriginous areas of toes) noted.  Psychiatric: She has a normal mood and affect.  Nursing note and vitals reviewed.   Assessment & Plan:   1. Controlled type 2 diabetes mellitus without complication, without long-term current use of insulin (HCC)  - POCT glucose (manual  entry) - POCT glycosylated hemoglobin (Hb A1C) - POCT UA - Microalbumin - Ambulatory referral to Ophthalmology  2. Tinea pedis of both feet  - terbinafine (LAMISIL AT) 1 % cream; Apply 1 application topically 2 (two) times daily. For 14 days.  Dispense: 30 g; Refill: 0  3. HYPERTENSION, BENIGN ESSENTIAL -Continue current medication      Follow-up: Return in about 3 months (around 06/22/2017) for DM/HTN.   Christina Bark FNP

## 2017-04-04 ENCOUNTER — Ambulatory Visit: Payer: Self-pay | Attending: Family Medicine

## 2017-04-06 ENCOUNTER — Ambulatory Visit: Payer: Self-pay | Attending: Family Medicine | Admitting: Physician Assistant

## 2017-04-06 VITALS — BP 134/84 | HR 93 | Temp 98.4°F | Resp 16 | Wt 194.6 lb

## 2017-04-06 DIAGNOSIS — K219 Gastro-esophageal reflux disease without esophagitis: Secondary | ICD-10-CM | POA: Insufficient documentation

## 2017-04-06 DIAGNOSIS — Z885 Allergy status to narcotic agent status: Secondary | ICD-10-CM | POA: Insufficient documentation

## 2017-04-06 DIAGNOSIS — Z881 Allergy status to other antibiotic agents status: Secondary | ICD-10-CM | POA: Insufficient documentation

## 2017-04-06 DIAGNOSIS — I10 Essential (primary) hypertension: Secondary | ICD-10-CM | POA: Insufficient documentation

## 2017-04-06 DIAGNOSIS — M109 Gout, unspecified: Secondary | ICD-10-CM | POA: Insufficient documentation

## 2017-04-06 DIAGNOSIS — Z79899 Other long term (current) drug therapy: Secondary | ICD-10-CM | POA: Insufficient documentation

## 2017-04-06 DIAGNOSIS — E119 Type 2 diabetes mellitus without complications: Secondary | ICD-10-CM | POA: Insufficient documentation

## 2017-04-06 DIAGNOSIS — Z7984 Long term (current) use of oral hypoglycemic drugs: Secondary | ICD-10-CM | POA: Insufficient documentation

## 2017-04-06 DIAGNOSIS — Z7982 Long term (current) use of aspirin: Secondary | ICD-10-CM | POA: Insufficient documentation

## 2017-04-06 DIAGNOSIS — M25561 Pain in right knee: Secondary | ICD-10-CM | POA: Insufficient documentation

## 2017-04-06 LAB — GLUCOSE, POCT (MANUAL RESULT ENTRY): POC GLUCOSE: 211 mg/dL — AB (ref 70–99)

## 2017-04-06 NOTE — Progress Notes (Signed)
Patient ID: Christina Byrd, female   DOB: 08-20-1965, 52 y.o.   MRN: [id number]   Christina Byrd, is a 52 y.o. female  [id number]  [id number]  DOB - April 25, 1965  Subjective:  Chief Complaint and HPI: Christina Byrd is a 52 y.o. female here today for R knee pain that was occurring 2 days ago agter climbing about 100 stairs.  She took some diclofenac which helped and the swelling and pain went away.  Her knee is almost pain free now.  No f/c.  H/o gout but not in her knee.  She hasn't taken allopurinol in over a year and has not had a gout flare in a long time.     ROS:   Constitutional:  No f/c, No night sweats, No unexplained weight loss. EENT:  No vision changes, No blurry vision, No hearing changes. No mouth, throat, or ear problems.  Respiratory: No cough, No SOB Cardiac: No CP, no palpitations GI:  No abd pain, No N/V/D. GU: No Urinary s/sx Musculoskeletal: +R knee pain Neuro: No headache, no dizziness, no motor weakness.  Skin: No rash Endocrine:  No polydipsia. No polyuria.  Psych: Denies SI/HI  No problems updated.  ALLERGIES: Allergies  Allergen Reactions  . Losartan Potassium     Rash  . Levaquin [Levofloxacin In D5w] Rash  . Ace Inhibitors Cough    REACTION: cough  . Codeine Nausea Only  . Cefepime Rash    09/04/12 pm Patient started to break out with small macules after IV Vanco infusion, then macules increased in size after starting cefepime infusion.  . Vancomycin Rash    09/04/12 pm Patient started to break out with small macules after IV Vanco infusion, then macules increased in size after starting cefepime infusion.    PAST MEDICAL HISTORY: Past Medical History:  Diagnosis Date  . GASTROESOPHAGEAL REFLUX, NO ESOPHAGITIS 04/21/2006   Qualifier: Diagnosis of  By: Levada Schilling    . GERD (gastroesophageal reflux disease) Dx 1995  . HTN (hypertension)     MEDICATIONS AT HOME: Prior to Admission medications   Medication Sig Start Date End Date  Taking? Authorizing Provider  allopurinol (ZYLOPRIM) 100 MG tablet Take 200 mg by mouth daily.    [provider]  amLODipine (NORVASC) 10 MG tablet Take 1 tablet (10 mg total) by mouth daily. 12/23/16   Lizbeth Bark, FNP  aspirin EC 81 MG tablet Take 1 tablet (81 mg total) by mouth daily. 10/26/12   Cathren Harsh, MD  azithromycin (ZITHROMAX) 250 MG tablet 2 today then 1 daily Patient not taking: Reported on 03/25/2017 01/19/17   Anders Simmonds, PA-C  benzonatate (TESSALON) 100 MG capsule Take 2 capsules (200 mg total) by mouth 2 (two) times daily as needed for cough. Patient not taking: Reported on 03/25/2017 01/19/17   Anders Simmonds, PA-C  diclofenac (VOLTAREN) 75 MG EC tablet Take 1 tablet (75 mg total) by mouth 2 (two) times daily as needed for mild pain. Patient not taking: Reported on 03/25/2017 01/19/17   Anders Simmonds, PA-C  gabapentin (NEURONTIN) 300 MG capsule Take 1 capsule (300 mg total) by mouth at bedtime. Patient not taking: Reported on 01/19/2017 12/23/16   Lizbeth Bark, FNP  metFORMIN (GLUCOPHAGE) 500 MG tablet Take 1 tablet (500 mg total) by mouth 2 (two) times daily with a meal. 12/23/16   Hairston, Leonia Reeves R, FNP  pantoprazole (PROTONIX) 20 MG tablet TAKE 1 TABLET BY MOUTH 2 TIMES DAILY BEFORE A MEAL 01/18/17  Arrie Senate R, FNP  terbinafine (LAMISIL AT) 1 % cream Apply 1 application topically 2 (two) times daily. For 14 days. 03/25/17   Lizbeth Bark, FNP     Objective:  EXAM:   Vitals:   04/06/17 1340  BP: 134/84  Pulse: 93  Resp: 16  Temp: 98.4 F (36.9 C)  TempSrc: Oral  SpO2: 98%  Weight: 194 lb 9.6 oz (88.3 kg)    General appearance : A&OX3. NAD. Non-toxic-appearing HEENT: Atraumatic and Normocephalic.  PERRLA. EOM intact.  Neck: supple, no JVD. No cervical lymphadenopathy. No thyromegaly Chest/Lungs:  Breathing-non-labored, Good air entry bilaterally, breath sounds normal without rales, rhonchi, or wheezing  CVS:  S1 S2 regular, no murmurs, gallops, rubs  Extremities: Bilateral Lower Ext shows no edema, both legs are warm to touch with = pulse throughout.  R and L knee examined.  No erythema or ballotment.  Skin is warm, dry, and in tact without erythema or induration.  Joint is stable.   Neurology:  CN II-XII grossly intact, Non focal.   Psych:  TP linear. J/I WNL. Normal speech. Appropriate eye contact and affect.  Skin:  No Rash  Data Review Lab Results  Component Value Date   HGBA1C 6.2 03/25/2017   HGBA1C 7.0 12/23/2016   HGBA1C 7.5 09/13/2016     Assessment & Plan   1. Acute pain of right knee Likely was sore and swollen due to stair climbing and has now resolved.  If pain recurs, she can take the voltaren she has on hand.  I have also provided and h/o on knee pain.  I also provided her a low purine diet sheet for gout prevention(though I do not think this was a gout attack.  I also caurioned her not to resume allopurinol during a possible gout attack.)  2. New onset type 2 diabetes mellitus (HCC) Uncontrolled.  Med compliance and diet imperative.  Adhere to regimen.   - POCT glucose (manual entry)  Patient have been counseled extensively about nutrition and exercise  Return for keep 4/30 2019 appt with Dr Alvis Lemmings.  The patient was given clear instructions to go to ER or return to medical center if symptoms don't improve, worsen or new problems develop. The patient verbalized understanding. The patient was told to call to get lab results if they haven't heard anything in th next week.     Georgian Co, PA-C [facility] [place], [place] [phone]   04/06/2017, 1:58 PM

## 2017-04-06 NOTE — Patient Instructions (Signed)
Low-Purine Diet Purines are compounds that affect the level of uric acid in your body. A low-purine diet is a diet that is low in purines. Eating a low-purine diet can prevent the level of uric acid in your body from getting too high and causing gout or kidney stones or both. What do I need to know about this diet?  Choose low-purine foods. Examples of low-purine foods are listed in the next section.  Drink plenty of fluids, especially water. Fluids can help remove uric acid from your body. Try to drink 8-16 cups (1.9-3.8 L) a day.  Limit foods high in fat, especially saturated fat, as fat makes it harder for the body to get rid of uric acid. Foods high in saturated fat include pizza, cheese, ice cream, whole milk, fried foods, and gravies. Choose foods that are lower in fat and lean sources of protein. Use olive oil when cooking as it contains healthy fats that are not high in saturated fat.  Limit alcohol. Alcohol interferes with the elimination of uric acid from your body. If you are having a gout attack, avoid all alcohol.  Keep in mind that different people's bodies react differently to different foods. You will probably learn over time which foods do or do not affect you. If you discover that a food tends to cause your gout to flare up, avoid eating that food. You can more freely enjoy foods that do not cause problems. If you have any questions about a food item, talk to your dietitian or health care provider. Which foods are low, moderate, and high in purines? The following is a list of foods that are low, moderate, and high in purines. You can eat any amount of the foods that are low in purines. You may be able to have small amounts of foods that are moderate in purines. Ask your health care provider how much of a food moderate in purines you can have. Avoid foods high in purines. Grains  Foods low in purines: Enriched white bread, pasta, rice, cake, cornbread, popcorn.  Foods moderate in  purines: Whole-grain breads and cereals, wheat germ, bran, oatmeal. Uncooked oatmeal. Dry wheat bran or wheat germ.  Foods high in purines: Pancakes, French toast, biscuits, muffins. Vegetables  Foods low in purines: All vegetables, except those that are moderate in purines.  Foods moderate in purines: Asparagus, cauliflower, spinach, mushrooms, green peas. Fruits  All fruits are low in purines. Meats and other Protein Foods  Foods low in purines: Eggs, nuts, peanut butter.  Foods moderate in purines: 80-90% lean beef, lamb, veal, pork, poultry, fish, eggs, peanut butter, nuts. Crab, lobster, oysters, and shrimp. Cooked dried beans, peas, and lentils.  Foods high in purines: Anchovies, sardines, herring, mussels, tuna, codfish, scallops, trout, and haddock. Bacon. Organ meats (such as liver or kidney). Tripe. Game meat. Goose. Sweetbreads. Dairy  All dairy foods are low in purines. Low-fat and fat-free dairy products are best because they are low in saturated fat. Beverages  Drinks low in purines: Water, carbonated beverages, tea, coffee, cocoa.  Drinks moderate in purines: Soft drinks and other drinks sweetened with high-fructose corn syrup. Juices. To find whether a food or drink is sweetened with high-fructose corn syrup, look at the ingredients list.  Drinks high in purines: Alcoholic beverages (such as beer). Condiments  Foods low in purines: Salt, herbs, olives, pickles, relishes, vinegar.  Foods moderate in purines: Butter, margarine, oils, mayonnaise. Fats and Oils  Foods low in purines: All types, except gravies   and sauces made with meat.  Foods high in purines: Gravies and sauces made with meat. Other Foods  Foods low in purines: Sugars, sweets, gelatin. Cake. Soups made without meat.  Foods moderate in purines: Meat-based or fish-based soups, broths, or bouillons. Foods and drinks sweetened with high-fructose corn syrup.  Foods high in purines: High-fat desserts  (such as ice cream, cookies, cakes, pies, doughnuts, and chocolate). Contact your dietitian for more information on foods that are not listed here. This information is not intended to replace advice given to you by your health care provider. Make sure you discuss any questions you have with your health care provider. Document Released: 06/05/2010 Document Revised: 07/17/2015 Document Reviewed: 01/15/2013 Elsevier Interactive Patient Education  2017 [employer] Knee Pain, Adult Many things can cause knee pain. The pain often goes away on its own with time and rest. If the pain does not go away, tests may be done to find out what is causing the pain. Follow these instructions at home: Activity  Rest your knee.  Do not do things that cause pain.  Avoid activities where both feet leave the ground at the same time (high-impact activities). Examples are running, jumping rope, and doing jumping jacks. General instructions  Take medicines only as told by your doctor.  Raise (elevate) your knee when you are resting. Make sure your knee is higher than your heart.  Sleep with a pillow under your knee.  If told, put ice on the knee: ? Put ice in a plastic bag. ? Place a towel between your skin and the bag. ? Leave the ice on for 20 minutes, 2-3 times a day.  Ask your doctor if you should wear an elastic knee support.  Lose weight if you are overweight. Being overweight can make your knee hurt more.  Do not use any tobacco products. These include cigarettes, chewing tobacco, or electronic cigarettes. If you need help quitting, ask your doctor. Smoking may slow down healing. Contact a doctor if:  The pain does not stop.  The pain changes or gets worse.  You have a fever along with knee pain.  Your knee gives out or locks up.  Your knee swells, and becomes worse. Get help right away if:  Your knee feels warm.  You cannot move your knee.  You have very bad knee pain.  You have  chest pain.  You have trouble breathing. Summary  Many things can cause knee pain. The pain often goes away on its own with time and rest.  Avoid activities that put stress on your knee. These include running and jumping rope.  Get help right away if you cannot move your knee, or if your knee feels warm, or if you have trouble breathing. This information is not intended to replace advice given to you by your health care provider. Make sure you discuss any questions you have with your health care provider. Document Released: 05/07/2008 Document Revised: 02/03/2016 Document Reviewed: 02/03/2016 Elsevier Interactive Patient Education  2017 [employer].

## 2017-04-14 MED FILL — AMLODIPINE BESYLATE 10 MG T: 10 | 30 days supply | Qty: 30 | Fill #3

## 2017-04-14 MED FILL — metFORMIN HCL 500 MG TABS: 500 | 30 days supply | Qty: 60 | Fill #3

## 2017-04-19 ENCOUNTER — Other Ambulatory Visit: Payer: Self-pay | Admitting: Pharmacist

## 2017-04-19 DIAGNOSIS — K219 Gastro-esophageal reflux disease without esophagitis: Secondary | ICD-10-CM

## 2017-04-19 MED ORDER — PANTOPRAZOLE SODIUM 20 MG PO TBEC: DELAYED_RELEASE_TABLET | ORAL | 0 refills | Status: DC

## 2017-04-19 MED FILL — PANTOPRAZOLE SOD DR 20 MG T: 20 | 30 days supply | Qty: 60 | Fill #0

## 2017-04-29 LAB — POCT ABI - SCREENING FOR PILOT NO CHARGE: Other: NEGATIVE

## 2017-05-16 MED FILL — metFORMIN HCL 500 MG TABS: 500 | 30 days supply | Qty: 60 | Fill #4

## 2017-05-16 MED FILL — AMLODIPINE BESYLATE 10 MG T: 10 | 30 days supply | Qty: 30 | Fill #4

## 2017-05-16 MED FILL — PANTOPRAZOLE SOD DR 20 MG T: 20 | 30 days supply | Qty: 60 | Fill #1

## 2017-05-26 ENCOUNTER — Telehealth: Payer: Self-pay

## 2017-05-26 NOTE — Telephone Encounter (Signed)
Patient called.

## 2017-05-26 NOTE — Telephone Encounter (Signed)
Error

## 2017-06-09 MED FILL — AMLODIPINE BESYLATE 10 MG T: 10 | 30 days supply | Qty: 30 | Fill #5

## 2017-06-21 ENCOUNTER — Ambulatory Visit: Payer: Self-pay | Attending: Family Medicine | Admitting: Family Medicine

## 2017-06-21 ENCOUNTER — Encounter: Payer: Self-pay | Admitting: Family Medicine

## 2017-06-21 VITALS — BP 129/85 | HR 83 | Temp 98.0°F | Wt 195.2 lb

## 2017-06-21 DIAGNOSIS — K219 Gastro-esophageal reflux disease without esophagitis: Secondary | ICD-10-CM | POA: Insufficient documentation

## 2017-06-21 DIAGNOSIS — Z7982 Long term (current) use of aspirin: Secondary | ICD-10-CM | POA: Insufficient documentation

## 2017-06-21 DIAGNOSIS — R232 Flushing: Secondary | ICD-10-CM

## 2017-06-21 DIAGNOSIS — N951 Menopausal and female climacteric states: Secondary | ICD-10-CM | POA: Insufficient documentation

## 2017-06-21 DIAGNOSIS — E119 Type 2 diabetes mellitus without complications: Secondary | ICD-10-CM | POA: Insufficient documentation

## 2017-06-21 DIAGNOSIS — Z7984 Long term (current) use of oral hypoglycemic drugs: Secondary | ICD-10-CM | POA: Insufficient documentation

## 2017-06-21 DIAGNOSIS — Z79899 Other long term (current) drug therapy: Secondary | ICD-10-CM | POA: Insufficient documentation

## 2017-06-21 DIAGNOSIS — I10 Essential (primary) hypertension: Secondary | ICD-10-CM | POA: Insufficient documentation

## 2017-06-21 LAB — GLUCOSE, POCT (MANUAL RESULT ENTRY): POC Glucose: 117 mg/dl — AB (ref 70–99)

## 2017-06-21 LAB — POCT GLYCOSYLATED HEMOGLOBIN (HGB A1C): Hemoglobin A1C: 6.6

## 2017-06-21 MED ORDER — CLONIDINE HCL 0.1 MG PO TABS: 0.1000 mg | ORAL_TABLET | Freq: Every evening | ORAL | 3 refills | Status: DC | PRN

## 2017-06-21 MED ORDER — PANTOPRAZOLE SODIUM 20 MG PO TBEC: DELAYED_RELEASE_TABLET | ORAL | 0 refills | Status: DC

## 2017-06-21 MED ORDER — AMLODIPINE BESYLATE 10 MG PO TABS: 10.0000 mg | ORAL_TABLET | Freq: Every day | ORAL | 1 refills | Status: DC

## 2017-06-21 MED ORDER — METFORMIN HCL 500 MG PO TABS: 500.0000 mg | ORAL_TABLET | Freq: Every day | ORAL | 6 refills | Status: DC

## 2017-06-21 MED FILL — metFORMIN HCL 500 MG TABS: 500 | 30 days supply | Qty: 30 | Fill #0

## 2017-06-21 MED FILL — PANTOPRAZOLE SOD DR 20 MG T: 20 | 30 days supply | Qty: 60 | Fill #2

## 2017-06-21 MED FILL — cloNIDine HCL 0.1 MG TABS: 0.1 | 30 days supply | Qty: 30 | Fill #0

## 2017-06-21 NOTE — Patient Instructions (Signed)
Menopause Menopause is the normal time of life when menstrual periods stop completely. Menopause is complete when you have missed 12 consecutive menstrual periods. It usually occurs between the ages of 48 years and 55 years. Very rarely does a woman develop menopause before the age of 40 years. At menopause, your ovaries stop producing the female hormones estrogen and progesterone. This can cause undesirable symptoms and also affect your health. Sometimes the symptoms may occur 4-5 years before the menopause begins. There is no relationship between menopause and:  Oral contraceptives.  Number of children you had.  Race.  The age your menstrual periods started (menarche).  Heavy smokers and very thin women may develop menopause earlier in life. What are the causes?  The ovaries stop producing the female hormones estrogen and progesterone. Other causes include:  Surgery to remove both ovaries.  The ovaries stop functioning for no known reason.  Tumors of the pituitary gland in the brain.  Medical disease that affects the ovaries and hormone production.  Radiation treatment to the abdomen or pelvis.  Chemotherapy that affects the ovaries.  What are the signs or symptoms?  Hot flashes.  Night sweats.  Decrease in sex drive.  Vaginal dryness and thinning of the vagina causing painful intercourse.  Dryness of the skin and developing wrinkles.  Headaches.  Tiredness.  Irritability.  Memory problems.  Weight gain.  Bladder infections.  Hair growth of the face and chest.  Infertility. More serious symptoms include:  Loss of bone (osteoporosis) causing breaks (fractures).  Depression.  Hardening and narrowing of the arteries (atherosclerosis) causing heart attacks and strokes.  How is this diagnosed?  When the menstrual periods have stopped for 12 straight months.  Physical exam.  Hormone studies of the blood. How is this treated? There are many treatment  choices and nearly as many questions about them. The decisions to treat or not to treat menopausal changes is an individual choice made with your health care provider. Your health care provider can discuss the treatments with you. Together, you can decide which treatment will work best for you. Your treatment choices may include:  Hormone therapy (estrogen and progesterone).  Non-hormonal medicines.  Treating the individual symptoms with medicine (for example antidepressants for depression).  Herbal medicines that may help specific symptoms.  Counseling by a psychiatrist or psychologist.  Group therapy.  Lifestyle changes including: ? Eating healthy. ? Regular exercise. ? Limiting caffeine and alcohol. ? Stress management and meditation.  No treatment.  Follow these instructions at home:  Take the medicine your health care provider gives you as directed.  Get plenty of sleep and rest.  Exercise regularly.  Eat a diet that contains calcium (good for the bones) and soy products (acts like estrogen hormone).  Avoid alcoholic beverages.  Do not smoke.  If you have hot flashes, dress in layers.  Take supplements, calcium, and vitamin D to strengthen bones.  You can use over-the-counter lubricants or moisturizers for vaginal dryness.  Group therapy is sometimes very helpful.  Acupuncture may be helpful in some cases. Contact a health care provider if:  You are not sure you are in menopause.  You are having menopausal symptoms and need advice and treatment.  You are still having menstrual periods after age 55 years.  You have pain with intercourse.  Menopause is complete (no menstrual period for 12 months) and you develop vaginal bleeding.  You need a referral to a specialist (gynecologist, psychiatrist, or psychologist) for treatment. Get help right   away if:  You have severe depression.  You have excessive vaginal bleeding.  You fell and think you have a  broken bone.  You have pain when you urinate.  You develop leg or chest pain.  You have a fast pounding heart beat (palpitations).  You have severe headaches.  You develop vision problems.  You feel a lump in your breast.  You have abdominal pain or severe indigestion. This information is not intended to replace advice given to you by your health care provider. Make sure you discuss any questions you have with your health care provider. Document Released: 05/01/2003 Document Revised: 07/17/2015 Document Reviewed: 09/07/2012 Elsevier Interactive Patient Education  2017 [employer] Inc[employer]  

## 2017-06-21 NOTE — Progress Notes (Signed)
Subjective:  Patient ID: Christina Byrd, female    DOB: [birth year]  Age: 52 y.o. MRN: [id number]  CC: Diabetes and Establish Care   HPI Christina Byrd is a 52 year old female with a history of type 2 diabetes mellitus (A1c 6.6), hypertension, GERD who presents today to establish care with me. Her A1c is 6.6 and has gone up from 6.2 previously.  She complains of diarrhea with metformin and had to cut down on metformin dosing to once daily and at other times she skips it altogether to prevent GI symptoms when she has to go out. She denies numbness in extremities, visual concerns or hypoglycemia.  States her job is her major form of exercise as she is usually on the move, picking up refrigerators, installing large equipments but is wondering why she is unable to lose weight as she eats once a day and drinks all day but sometimes takes a can of soda. She is doing well on her antihypertensive and denies adverse effects. Reflux symptoms are controlled on her current regimen. Complains of hot flashes on most nights of the week and would like something to treat it.  Past Medical History:  Diagnosis Date  . GASTROESOPHAGEAL REFLUX, NO ESOPHAGITIS 04/21/2006   Qualifier: Diagnosis of  By: Benna Dunks    . GERD (gastroesophageal reflux disease) Dx 1995  . HTN (hypertension)     History reviewed. No pertinent surgical history.  Allergies  Allergen Reactions  . Losartan Potassium     Rash  . Levaquin [Levofloxacin In D5w] Rash  . Ace Inhibitors Cough    REACTION: cough  . Codeine Nausea Only  . Cefepime Rash    09/04/12 pm Patient started to break out with small macules after IV Vanco infusion, then macules increased in size after starting cefepime infusion.  . Vancomycin Rash    09/04/12 pm Patient started to break out with small macules after IV Vanco infusion, then macules increased in size after starting cefepime infusion.     Outpatient Medications Prior to Visit  Medication  Sig Dispense Refill  . aspirin EC 81 MG tablet Take 1 tablet (81 mg total) by mouth daily.    Marland Kitchen azithromycin (ZITHROMAX) 250 MG tablet 2 today then 1 daily (Patient not taking: Reported on 03/25/2017) 6 tablet 0  . benzonatate (TESSALON) 100 MG capsule Take 2 capsules (200 mg total) by mouth 2 (two) times daily as needed for cough. (Patient not taking: Reported on 03/25/2017) 40 capsule 0  . diclofenac (VOLTAREN) 75 MG EC tablet Take 1 tablet (75 mg total) by mouth 2 (two) times daily as needed for mild pain. (Patient not taking: Reported on 03/25/2017) 60 tablet 1  . gabapentin (NEURONTIN) 300 MG capsule Take 1 capsule (300 mg total) by mouth at bedtime. (Patient not taking: Reported on 01/19/2017) 30 capsule 2  . terbinafine (LAMISIL AT) 1 % cream Apply 1 application topically 2 (two) times daily. For 14 days. 30 g 0  . amLODipine (NORVASC) 10 MG tablet Take 1 tablet (10 mg total) by mouth daily. 90 tablet 1  . metFORMIN (GLUCOPHAGE) 500 MG tablet Take 1 tablet (500 mg total) by mouth 2 (two) times daily with a meal. 60 tablet 5  . pantoprazole (PROTONIX) 20 MG tablet TAKE 1 TABLET BY MOUTH 2 TIMES DAILY BEFORE A MEAL 180 tablet 0   No facility-administered medications prior to visit.     ROS Review of Systems  Constitutional: Negative for activity change, appetite change and fatigue.  HENT: Negative for congestion, sinus pressure and sore throat.   Eyes: Negative for visual disturbance.  Respiratory: Negative for cough, chest tightness, shortness of breath and wheezing.   Cardiovascular: Negative for chest pain and palpitations.  Gastrointestinal: Negative for abdominal distention, abdominal pain and constipation.  Endocrine: Negative for polydipsia.  Genitourinary: Negative for dysuria and frequency.  Musculoskeletal: Negative for arthralgias and back pain.  Skin: Negative for rash.  Neurological: Negative for tremors, light-headedness and numbness.  Hematological: Does not bruise/bleed  easily.  Psychiatric/Behavioral: Negative for agitation and behavioral problems.    Objective:  BP 129/85   Pulse 83   Temp 98 F (36.7 C) (Oral)   Wt 195 lb 3.2 oz (88.5 kg)   LMP 12/17/2014   SpO2 99%   BMI 35.70 kg/m   BP/Weight 06/21/2017 10/10/2991 08/22/6965  Systolic BP 893 810 175  Diastolic BP 85 84 89  Wt. (Lbs) 195.2 194.6 193.2  BMI 35.7 35.59 35.34    Physical Exam  Constitutional: She is oriented to person, place, and time. She appears well-developed and well-nourished.  Cardiovascular: Normal rate, normal heart sounds and intact distal pulses.  No murmur heard. Pulmonary/Chest: Effort normal and breath sounds normal. She has no wheezes. She has no rales. She exhibits no tenderness.  Abdominal: Soft. Bowel sounds are normal. She exhibits no distension and no mass. There is no tenderness.  Musculoskeletal: Normal range of motion.  Neurological: She is alert and oriented to person, place, and time.  Skin: Skin is warm and dry.  Psychiatric: She has a normal mood and affect.     CMP Latest Ref Rng & Units 07/23/2016 07/09/2016 07/08/2016  Glucose 65 - 99 mg/dL 212(H) 126(H) 181(H)  BUN 6 - 24 mg/dL '11 8 14  '$ Creatinine 0.57 - 1.00 mg/dL 0.74 0.69 0.89  Sodium 134 - 144 mmol/L 139 138 140  Potassium 3.5 - 5.2 mmol/L 4.2 2.9(L) 3.5  Chloride 96 - 106 mmol/L 100 105 108  CO2 18 - 29 mmol/L 22 22 21(L)  Calcium 8.7 - 10.2 mg/dL 10.2 8.9 9.2  Total Protein 6.5 - 8.1 g/dL - - 7.3  Total Bilirubin 0.3 - 1.2 mg/dL - - 0.8  Alkaline Phos 38 - 126 U/L - - 102  AST 15 - 41 U/L - - 29  ALT 14 - 54 U/L - - 20    Lipid Panel     Component Value Date/Time   CHOL 194 10/26/2012 1052   TRIG 230 (H) 10/26/2012 1052   HDL 82 10/26/2012 1052   CHOLHDL 2.4 10/26/2012 1052   VLDL 46 (H) 10/26/2012 1052   LDLCALC 66 10/26/2012 1052   LDLDIRECT 153 (H) 01/30/2008 2146     Lab Results  Component Value Date   HGBA1C 6.6 06/21/2017    Assessment & Plan:   1. Controlled  type 2 diabetes mellitus without complication, without long-term current use of insulin (HCC) A1c of 6.6 which has trended up from 6.2 previously Unable to tolerate metformin due to GI side effects so I am decreasing from twice daily to once daily dosing If A1c trends up further at next visit, I will commence glipizide Counseled on Diabetic diet, my plate method, 102 minutes of moderate intensity exercise/week Keep blood sugar logs with fasting goals of 80-120 mg/dl, random of less than 180 and in the event of sugars less than 60 mg/dl or greater than 400 mg/dl please notify the clinic ASAP. It is recommended that you undergo annual eye exams and  annual foot exams. Pneumonia vaccine is recommended. Discussed increasing physical activity, walking, avoiding late meals or with the aim of losing weight. - POCT glucose (manual entry) - POCT glycosylated hemoglobin (Hb A1C) - metFORMIN (GLUCOPHAGE) 500 MG tablet; Take 1 tablet (500 mg total) by mouth daily with breakfast.  Dispense: 30 tablet; Refill: 6 - CMP14+EGFR; Future - Lipid panel; Future - Microalbumin/Creatinine Ratio, Urine; Future  2. Gastroesophageal reflux disease, esophagitis presence not specified Stable - pantoprazole (PROTONIX) 20 MG tablet; TAKE 1 TABLET BY MOUTH 2 TIMES DAILY BEFORE A MEAL  Dispense: 180 tablet; Refill: 0  3. HYPERTENSION, BENIGN ESSENTIAL Controlled - amLODipine (NORVASC) 10 MG tablet; Take 1 tablet (10 mg total) by mouth daily.  Dispense: 90 tablet; Refill: 1  4. Hot flashes Uncontrolled Comments clonidine and discussed sedating side effects - cloNIDine (CATAPRES) 0.1 MG tablet; Take 1 tablet (0.1 mg total) by mouth at bedtime as needed. For hot flashes  Dispense: 30 tablet; Refill: 3   Meds ordered this encounter  Medications  . metFORMIN (GLUCOPHAGE) 500 MG tablet    Sig: Take 1 tablet (500 mg total) by mouth daily with breakfast.    Dispense:  30 tablet    Refill:  6    Discontinue previous dose    . pantoprazole (PROTONIX) 20 MG tablet    Sig: TAKE 1 TABLET BY MOUTH 2 TIMES DAILY BEFORE A MEAL    Dispense:  180 tablet    Refill:  0  . amLODipine (NORVASC) 10 MG tablet    Sig: Take 1 tablet (10 mg total) by mouth daily.    Dispense:  90 tablet    Refill:  1  . cloNIDine (CATAPRES) 0.1 MG tablet    Sig: Take 1 tablet (0.1 mg total) by mouth at bedtime as needed. For hot flashes    Dispense:  30 tablet    Refill:  3    Follow-up: Return in about 3 months (around 09/20/2017) for Follow up of chronic medical conditions.   Charlott Rakes MD

## 2017-06-22 ENCOUNTER — Ambulatory Visit: Payer: Self-pay | Attending: Family Medicine

## 2017-06-22 DIAGNOSIS — E119 Type 2 diabetes mellitus without complications: Secondary | ICD-10-CM | POA: Insufficient documentation

## 2017-06-22 NOTE — Progress Notes (Signed)
Patient here for lab visit only 

## 2017-06-23 LAB — CMP14+EGFR
ALBUMIN: 4 g/dL (ref 3.5–5.5)
ALT: 21 IU/L (ref 0–32)
AST: 30 IU/L (ref 0–40)
Albumin/Globulin Ratio: 1.3 (ref 1.2–2.2)
Alkaline Phosphatase: 112 IU/L (ref 39–117)
BILIRUBIN TOTAL: 0.3 mg/dL (ref 0.0–1.2)
BUN/Creatinine Ratio: 17 (ref 9–23)
BUN: 14 mg/dL (ref 6–24)
CALCIUM: 8.9 mg/dL (ref 8.7–10.2)
CHLORIDE: 101 mmol/L (ref 96–106)
CO2: 19 mmol/L — ABNORMAL LOW (ref 20–29)
CREATININE: 0.83 mg/dL (ref 0.57–1.00)
GFR, EST AFRICAN AMERICAN: 94 mL/min/{1.73_m2} (ref >59)
GFR, EST NON AFRICAN AMERICAN: 82 mL/min/{1.73_m2} (ref >59)
GLUCOSE: 124 mg/dL — AB (ref 65–99)
Globulin, Total: 3 g/dL (ref 1.5–4.5)
Potassium: 4 mmol/L (ref 3.5–5.2)
Sodium: 140 mmol/L (ref 134–144)
TOTAL PROTEIN: 7 g/dL (ref 6.0–8.5)

## 2017-06-23 LAB — LIPID PANEL
Chol/HDL Ratio: 3.7 ratio (ref 0.0–4.4)
Cholesterol, Total: 161 mg/dL (ref 100–199)
HDL: 44 mg/dL (ref >39)
LDL Calculated: 66 mg/dL (ref 0–99)
Triglycerides: 253 mg/dL — ABNORMAL HIGH (ref 0–149)
VLDL Cholesterol Cal: 51 mg/dL — ABNORMAL HIGH (ref 5–40)

## 2017-06-23 LAB — MICROALBUMIN / CREATININE URINE RATIO
Creatinine, Urine: 117.8 mg/dL
Microalb/Creat Ratio: 32.3 mg/g creat — ABNORMAL HIGH (ref 0.0–30.0)
Microalbumin, Urine: 38 ug/mL

## 2017-06-24 ENCOUNTER — Telehealth: Payer: Self-pay

## 2017-06-24 NOTE — Telephone Encounter (Signed)
Patient was called and informed to contact office to go over lab results. 

## 2017-06-30 NOTE — Telephone Encounter (Signed)
-----   Message from Hoy Register, MD sent at 06/23/2017  4:13 PM EDT ----- Total cholesterol is normal but triglycerides are slightly elevated, OTC fish oil capsules will be beneficial. Urine shows very early microalbumin which is an early sign of diabetes affecting the kidney and optimal glycemic control will slow this down.

## 2017-06-30 NOTE — Telephone Encounter (Signed)
Patient was called and informed of lab results. 

## 2017-07-13 MED FILL — PANTOPRAZOLE SOD DR 20 MG T: 20 | 30 days supply | Qty: 60 | Fill #0

## 2017-07-13 MED FILL — AMLODIPINE BESYLATE 10 MG T: 10 | 30 days supply | Qty: 30 | Fill #0

## 2017-07-19 ENCOUNTER — Ambulatory Visit (HOSPITAL_COMMUNITY)
Admission: EM | Admit: 2017-07-19 | Discharge: 2017-07-19 | Disposition: A | Discharge status: Home / Self Care | Payer: Self-pay | Attending: Family Medicine | Admitting: Family Medicine

## 2017-07-19 ENCOUNTER — Encounter (HOSPITAL_COMMUNITY): Payer: Self-pay | Admitting: Emergency Medicine

## 2017-07-19 DIAGNOSIS — M62830 Muscle spasm of back: Secondary | ICD-10-CM

## 2017-07-19 MED ORDER — DEXAMETHASONE SODIUM PHOSPHATE 10 MG/ML IJ SOLN
10.0000 mg | Freq: Once | INTRAMUSCULAR | Status: AC
  Administered 2017-07-19: 10 mg via INTRAMUSCULAR

## 2017-07-19 MED ORDER — KETOROLAC TROMETHAMINE 60 MG/2ML IM SOLN
INTRAMUSCULAR | Status: AC
Start: 2017-07-19 — End: ?
  Filled 2017-07-19: qty 2

## 2017-07-19 MED ORDER — CYCLOBENZAPRINE HCL 5 MG PO TABS: 5.0000 mg | ORAL_TABLET | Freq: Three times a day (TID) | ORAL | 1 refills | Status: DC | PRN

## 2017-07-19 MED ORDER — OXYCODONE HCL 5 MG PO TABS: 5.0000 mg | ORAL_TABLET | ORAL | 0 refills | Status: DC | PRN

## 2017-07-19 MED ORDER — IBUPROFEN 800 MG PO TABS: 800.0000 mg | ORAL_TABLET | Freq: Three times a day (TID) | ORAL | 0 refills | Status: DC | PRN

## 2017-07-19 MED ORDER — KETOROLAC TROMETHAMINE 60 MG/2ML IM SOLN
60.0000 mg | Freq: Once | INTRAMUSCULAR | Status: AC
  Administered 2017-07-19: 60 mg via INTRAMUSCULAR

## 2017-07-19 MED ORDER — DEXAMETHASONE SODIUM PHOSPHATE 10 MG/ML IJ SOLN
INTRAMUSCULAR | Status: AC
  Filled 2017-07-19: qty 1

## 2017-07-19 NOTE — Discharge Instructions (Addendum)
Use ice to area Take  ibuprofen for moderate pain Take the pain medicine as needed severe pain.  This cause cause constipation and drowsiness. Take the muscle relaxer as needed spasm and tightness of muscle Modify activity until pain improves, likely 2 weeks Return as needed

## 2017-07-19 NOTE — ED Provider Notes (Signed)
[facility]    CSN: [id number] Arrival date & time: 07/19/17  1935     History   Chief Complaint Chief Complaint  Patient presents with  . Back Pain    HPI Christina Byrd is a 52 y.o. female.   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  She does a lot of pushing pulling and heavy lifting.  She states that couple days ago she and her husband were trying to get a air conditioner out of the window.  She had to push and pull, inside and outside.  She will need strain in her right back.  Was not terribly painful.  Over the next 2 days it is not an open become severely painful.  Now it hurts if she breathes, coughs, or laughs.  She has trouble getting comfortable.  The spasm is continuous.  It is in the right flank region.  It looks swollen to them.  No coughing or chest congestion.  No prior back condition.  She states normally she is well controlled from a diabetes, hypertension, and GERD standpoint.  Past Medical History:  Diagnosis Date  . GASTROESOPHAGEAL REFLUX, NO ESOPHAGITIS 04/21/2006   Qualifier: Diagnosis of  By: Levada Schilling    . GERD (gastroesophageal reflux disease) Dx 1995  . HTN (hypertension)     Patient Active Problem List   Diagnosis Date Noted  . New onset type 2 diabetes mellitus (HCC) 09/13/2016  . Traumatic rhabdomyolysis (HCC) 07/23/2016  . Depression 07/08/2016  . Cocaine use   . Hot flash, menopausal 06/22/2016  . Intertrigo 06/22/2016  . Gout of left ankle 10/09/2015  . Left hip pain 09/02/2015  . EtOH dependence (HCC) 09/02/2015  . Colonoscopy refused 09/02/2015  . Hyperuricemia 05/27/2015  . 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 Pain and swelling of left ankle 12/02/2014  . Tendinitis of right hip flexor 07/23/2014  . Allergic rhinitis 05/13/2014  . GERD (gastroesophageal reflux disease) 04/01/2014  . Tendonitis, Achilles, right 04/01/2014  . Osteoarthritis of left knee 12/11/2013  . Ex-smoker 11/09/2008  .  OBESITY 08/12/2006  . HYPERTENSION, BENIGN ESSENTIAL 08/12/2006    History reviewed. No pertinent surgical history.  OB History   None      Home Medications    Prior to Admission medications   Medication Sig Start Date End Date Taking? Authorizing Provider  amLODipine (NORVASC) 10 MG tablet Take 1 tablet (10 mg total) by mouth daily. 06/21/17   Hoy Register, MD  aspirin EC 81 MG tablet Take 1 tablet (81 mg total) by mouth daily. 10/26/12   Rai, Ripudeep K, MD  cloNIDine (CATAPRES) 0.1 MG tablet Take 1 tablet (0.1 mg total) by mouth at bedtime as needed. For hot flashes 06/21/17   Hoy Register, MD  cyclobenzaprine (FLEXERIL) 5 MG tablet Take 1 tablet (5 mg total) by mouth 3 (three) times daily as needed for muscle spasms. 07/19/17   Eustace Moore, MD  ibuprofen (ADVIL,MOTRIN) 800 MG tablet Take 1 tablet (800 mg total) by mouth every 8 (eight) hours as needed for moderate pain. Take with food 07/19/17   Eustace Moore, MD  metFORMIN (GLUCOPHAGE) 500 MG tablet Take 1 tablet (500 mg total) by mouth daily with breakfast. 06/21/17   Hoy Register, MD  oxyCODONE (ROXICODONE) 5 MG immediate release tablet Take 1 tablet (5 mg total) by mouth every 4 (four) hours as needed for severe pain. 07/19/17   Eustace Moore, MD  pantoprazole (PROTONIX)  20 MG tablet TAKE 1 TABLET BY MOUTH 2 TIMES DAILY BEFORE A MEAL 06/21/17   Hoy Register, MD  terbinafine (LAMISIL AT) 1 % cream Apply 1 application topically 2 (two) times daily. For 14 days. 03/25/17   Lizbeth Bark, FNP    Family History Family History  Problem Relation Age of Onset  . CAD Mother     Social History Social History   Tobacco Use  . Smoking status: Former Smoker    Last attempt to quit: 06/25/2012    Years since quitting: 5.0  . Smokeless tobacco: Never Used  Substance Use Topics  . Alcohol use: Yes    Alcohol/week: 1.5 oz    Types: 3 Standard drinks or equivalent per week    Comment: ADMITS TO DRINKING 3-4  BEERS/DAY  . Drug use: No     Allergies   Losartan potassium; Levaquin [levofloxacin in d5w]; Ace inhibitors; Codeine; Cefepime; and Vancomycin   Review of Systems Review of Systems  Constitutional: Negative for chills and fever.  HENT: Negative for ear pain and sore throat.   Eyes: Negative for pain and visual disturbance.  Respiratory: Negative for cough and shortness of breath.        Pain with deep breath  Cardiovascular: Negative for chest pain and palpitations.  Gastrointestinal: Negative for abdominal pain and vomiting.  Genitourinary: Negative for dysuria and hematuria.  Musculoskeletal: Positive for back pain. Negative for arthralgias.  Skin: Negative for color change and rash.  Neurological: Negative for seizures and syncope.  All other systems reviewed and are negative.    Physical Exam Triage Vital Signs ED Triage Vitals [07/19/17 2003]  Enc Vitals Group     BP (!) 141/78     Pulse Rate 99     Resp 18     Temp 99.2 F (37.3 C)     Temp Source Oral     SpO2 98 %     Weight      Height      Head Circumference      Peak Flow      Pain Score      Pain Loc      Pain Edu?      Excl. in GC?    No data found.  Updated Vital Signs BP (!) 141/78 (BP Location: Left Arm)   Pulse 99   Temp 99.2 F (37.3 C) (Oral)   Resp 18   LMP 12/17/2014   SpO2 98%   Visual Acuity Right Eye Distance:   Left Eye Distance:   Bilateral Distance:    Right Eye Near:   Left Eye Near:    Bilateral Near:     Physical Exam  Constitutional: She appears well-developed and well-nourished. She appears distressed.  Acutely uncomfortable.  In wheelchair.  HENT:  Head: Normocephalic and atraumatic.  Mouth/Throat: Oropharynx is clear and moist.  Eyes: Pupils are equal, round, and reactive to light. Conjunctivae are normal.  Neck: Normal range of motion.  Cardiovascular: Normal rate, regular rhythm and normal heart sounds.  Pulmonary/Chest: Effort normal. No respiratory  distress. She has no wheezes.  Abdominal: Soft. She exhibits no distension.  Musculoskeletal: Normal range of motion. She exhibits no edema.       Back:  Straight leg raise is difficult for patient, does not reproduce any pain.  No radiculopathy.  Reflexes intact.  Neurological: She is alert. She displays normal reflexes.  Skin: Skin is warm and dry.     UC Treatments / Results  Labs (all labs ordered are listed, but only abnormal results are displayed) Labs Reviewed - No data to display  EKG None  Radiology No results found.  Procedures Procedures (including critical care time)  Medications Ordered in UC Medications  ketorolac (TORADOL) injection 60 mg (60 mg Intramuscular Given 07/19/17 2028)  dexamethasone (DECADRON) injection 10 mg (10 mg Intramuscular Given 07/19/17 2026)    Initial Impression / Assessment and Plan / UC Course  I have reviewed the triage vital signs and the nursing notes.  Pertinent labs & imaging results that were available during my care of the patient were reviewed by me and considered in my medical decision making (see chart for details).     Muscular back pain discussed.  No need for x-rays.  Management includes ice, anti-inflammatories, muscle relaxers.  Pain management.  Reduced workload.  Follow-up with orthopedic as needed. Final Clinical Impressions(s) / UC Diagnoses   Final diagnoses:  Muscle spasm of back     Discharge Instructions     Use ice to area Take  ibuprofen for moderate pain Take the pain medicine as needed severe pain.  This cause cause constipation and drowsiness. Take the muscle relaxer as needed spasm and tightness of muscle Modify activity until pain improves, likely 2 weeks Return as needed    ED Prescriptions    Medication Sig Dispense Auth. Provider   oxyCODONE (ROXICODONE) 5 MG immediate release tablet Take 1 tablet (5 mg total) by mouth every 4 (four) hours as needed for severe pain. 20 tablet Eustace Moore, MD   ibuprofen (ADVIL,MOTRIN) 800 MG tablet Take 1 tablet (800 mg total) by mouth every 8 (eight) hours as needed for moderate pain. Take with food 90 tablet Eustace Moore, MD   cyclobenzaprine (FLEXERIL) 5 MG tablet Take 1 tablet (5 mg total) by mouth 3 (three) times daily as needed for muscle spasms. 30 tablet Eustace Moore, MD     Controlled Substance Prescriptions [place] Controlled Substance Registry consulted? Yes, I have consulted the [place] Controlled Substances Registry for this patient, and feel the risk/benefit ratio today is favorable for proceeding with this prescription for a controlled substance. No Rx since 2017     Eustace Moore, MD 07/19/17 2040

## 2017-07-19 NOTE — ED Triage Notes (Signed)
Pt sts mid back pain

## 2017-08-09 ENCOUNTER — Other Ambulatory Visit: Payer: Self-pay

## 2017-08-09 DIAGNOSIS — I10 Essential (primary) hypertension: Secondary | ICD-10-CM

## 2017-08-09 MED ORDER — AMLODIPINE BESYLATE 10 MG PO TABS: 10.0000 mg | ORAL_TABLET | Freq: Every day | ORAL | 1 refills | Status: DC

## 2017-08-09 MED FILL — PANTOPRAZOLE SOD DR 20 MG T: 20 | 30 days supply | Qty: 60 | Fill #1

## 2017-08-09 MED FILL — AMLODIPINE BESYLATE 10 MG T: 10 | 30 days supply | Qty: 30 | Fill #0

## 2017-09-05 ENCOUNTER — Other Ambulatory Visit: Payer: Self-pay

## 2017-09-05 ENCOUNTER — Encounter (HOSPITAL_COMMUNITY): Payer: Self-pay | Admitting: Emergency Medicine

## 2017-09-05 ENCOUNTER — Emergency Department (HOSPITAL_COMMUNITY)
Admission: EM | Admit: 2017-09-05 | Discharge: 2017-09-06 | Disposition: A | Discharge status: Home / Self Care | Payer: Self-pay | Attending: Emergency Medicine | Admitting: Emergency Medicine

## 2017-09-05 DIAGNOSIS — N289 Disorder of kidney and ureter, unspecified: Secondary | ICD-10-CM | POA: Insufficient documentation

## 2017-09-05 DIAGNOSIS — Z79899 Other long term (current) drug therapy: Secondary | ICD-10-CM | POA: Insufficient documentation

## 2017-09-05 DIAGNOSIS — Z87891 Personal history of nicotine dependence: Secondary | ICD-10-CM | POA: Insufficient documentation

## 2017-09-05 DIAGNOSIS — R569 Unspecified convulsions: Secondary | ICD-10-CM | POA: Insufficient documentation

## 2017-09-05 DIAGNOSIS — E876 Hypokalemia: Secondary | ICD-10-CM | POA: Insufficient documentation

## 2017-09-05 DIAGNOSIS — I10 Essential (primary) hypertension: Secondary | ICD-10-CM | POA: Insufficient documentation

## 2017-09-05 DIAGNOSIS — E119 Type 2 diabetes mellitus without complications: Secondary | ICD-10-CM | POA: Insufficient documentation

## 2017-09-05 DIAGNOSIS — Z7984 Long term (current) use of oral hypoglycemic drugs: Secondary | ICD-10-CM | POA: Insufficient documentation

## 2017-09-05 LAB — BASIC METABOLIC PANEL
Anion gap: 17 — ABNORMAL HIGH (ref 5–15)
BUN: 14 mg/dL (ref 6–20)
CALCIUM: 7.9 mg/dL — AB (ref 8.9–10.3)
CO2: 22 mmol/L (ref 22–32)
CREATININE: 1.33 mg/dL — AB (ref 0.44–1.00)
Chloride: 104 mmol/L (ref 98–111)
GFR calc non Af Amer: 45 mL/min — ABNORMAL LOW (ref >60)
GFR, EST AFRICAN AMERICAN: 52 mL/min — AB (ref >60)
Glucose, Bld: 152 mg/dL — ABNORMAL HIGH (ref 70–99)
Potassium: 2.9 mmol/L — ABNORMAL LOW (ref 3.5–5.1)
SODIUM: 143 mmol/L (ref 135–145)

## 2017-09-05 LAB — CBC
HCT: 40.9 % (ref 36.0–46.0)
Hemoglobin: 13.4 g/dL (ref 12.0–15.0)
MCH: 29.6 pg (ref 26.0–34.0)
MCHC: 32.8 g/dL (ref 30.0–36.0)
MCV: 90.3 fL (ref 78.0–100.0)
PLATELETS: 220 10*3/uL (ref 150–400)
RBC: 4.53 MIL/uL (ref 3.87–5.11)
RDW: 15.9 % — AB (ref 11.5–15.5)
WBC: 9.9 10*3/uL (ref 4.0–10.5)

## 2017-09-05 LAB — CBG MONITORING, ED: GLUCOSE-CAPILLARY: 150 mg/dL — AB (ref 70–99)

## 2017-09-05 LAB — I-STAT BETA HCG BLOOD, ED (MC, WL, AP ONLY): HCG, QUANTITATIVE: 8.4 m[IU]/mL — AB (ref <5)

## 2017-09-05 NOTE — ED Notes (Signed)
CBG: 150 

## 2017-09-05 NOTE — ED Triage Notes (Signed)
Per PT she had a witnessed seizure today at home that lasted 8 minutes, 4th one recently, no prior medical workup for them. Pt had an EKG strip that was completed by EMS that showed a heart rate of 130 from earlier today.  Pt refused to ride w/ EMS to be checked out.

## 2017-09-06 ENCOUNTER — Emergency Department (HOSPITAL_COMMUNITY): Payer: Self-pay

## 2017-09-06 LAB — HEPATIC FUNCTION PANEL
ALK PHOS: 76 U/L (ref 38–126)
ALT: 14 U/L (ref 0–44)
AST: 20 U/L (ref 15–41)
Albumin: 3.6 g/dL (ref 3.5–5.0)
BILIRUBIN DIRECT: 0.2 mg/dL (ref 0.0–0.2)
BILIRUBIN TOTAL: 0.7 mg/dL (ref 0.3–1.2)
Indirect Bilirubin: 0.5 mg/dL (ref 0.3–0.9)
Total Protein: 7 g/dL (ref 6.5–8.1)

## 2017-09-06 LAB — RAPID URINE DRUG SCREEN, HOSP PERFORMED
Amphetamines: NOT DETECTED
Benzodiazepines: NOT DETECTED
Cocaine: NOT DETECTED
OPIATES: NOT DETECTED
Tetrahydrocannabinol: NOT DETECTED

## 2017-09-06 LAB — HCG, QUANTITATIVE, PREGNANCY: HCG, BETA CHAIN, QUANT, S: 6 m[IU]/mL — AB (ref <5)

## 2017-09-06 LAB — ETHANOL

## 2017-09-06 LAB — CK: CK TOTAL: 438 U/L — AB (ref 38–234)

## 2017-09-06 LAB — MAGNESIUM: MAGNESIUM: 1.1 mg/dL — AB (ref 1.7–2.4)

## 2017-09-06 MED ORDER — POTASSIUM CHLORIDE 10 MEQ/100ML IV SOLN
10.0000 meq | Freq: Once | INTRAVENOUS | Status: AC
  Administered 2017-09-06: 10 meq via INTRAVENOUS
  Filled 2017-09-06: qty 100

## 2017-09-06 MED ORDER — POTASSIUM CHLORIDE CRYS ER 20 MEQ PO TBCR: 20.0000 meq | EXTENDED_RELEASE_TABLET | Freq: Two times a day (BID) | ORAL | 0 refills | Status: DC

## 2017-09-06 MED ORDER — MAGNESIUM SULFATE 2 GM/50ML IV SOLN
2.0000 g | Freq: Once | INTRAVENOUS | Status: AC
  Administered 2017-09-06: 2 g via INTRAVENOUS
  Filled 2017-09-06: qty 50

## 2017-09-06 MED ORDER — SODIUM CHLORIDE 0.9 % IV BOLUS
1000.0000 mL | Freq: Once | INTRAVENOUS | Status: AC
  Administered 2017-09-06: 1000 mL via INTRAVENOUS

## 2017-09-06 MED ORDER — POTASSIUM CHLORIDE CRYS ER 20 MEQ PO TBCR
40.0000 meq | EXTENDED_RELEASE_TABLET | Freq: Once | ORAL | Status: AC
  Administered 2017-09-06: 40 meq via ORAL
  Filled 2017-09-06: qty 2

## 2017-09-06 MED ORDER — LEVETIRACETAM IN NACL 1000 MG/100ML IV SOLN
1000.0000 mg | Freq: Once | INTRAVENOUS | Status: AC
  Administered 2017-09-06: 1000 mg via INTRAVENOUS
  Filled 2017-09-06: qty 100

## 2017-09-06 MED ORDER — LEVETIRACETAM 500 MG PO TABS: 500.0000 mg | ORAL_TABLET | Freq: Two times a day (BID) | ORAL | 0 refills | Status: DC

## 2017-09-06 MED ORDER — MAGNESIUM CHLORIDE 64 MG PO TBEC: 2.0000 | DELAYED_RELEASE_TABLET | Freq: Two times a day (BID) | ORAL | 0 refills | Status: DC

## 2017-09-06 MED FILL — levETIRAcetam 500 MG TABS: 500 | 30 days supply | Qty: 60 | Fill #0

## 2017-09-06 MED FILL — POTASSIUM CL ER 20 MEQ TAB: 20 | 10 days supply | Qty: 20 | Fill #0

## 2017-09-06 NOTE — Discharge Instructions (Signed)
Your HCG level was minimally elevated. Please check a pregnancy test in two weeks - if negative, check one more in four weeks.   The law in [place] is that you can not drive a car if you have had a seizure in the past six months.

## 2017-09-06 NOTE — ED Notes (Addendum)
Patient transported to CT 

## 2017-09-06 NOTE — ED Provider Notes (Signed)
[facility] EMERGENCY DEPARTMENT Provider Note   CSN: [id number] Arrival date & time: 09/05/17  1943     History   Chief Complaint Chief Complaint  Patient presents with  . Seizures    HPI Christina Byrd is a 52 y.o. female.  The history is provided by the patient and the spouse.  She has a history of hypertension, diabetes, alcohol use, cocaine use and comes in after having had a seizure at home.  Husband witnessed a generalized seizure which lasted about 5 minutes.  She did bite her lip and tongue but did not have any incontinence.  Has been relates a postictal state which lasted about 10 minutes.  She is back to her normal mental status.  She does admit to drinking 36 ounces of beer a day but denies recent cocaine use.  However, she states that she had been cleaning a house and there was some powder and wonders if she might of been exposed to cocaine.  She did have a seizure about 1 year ago and was admitted to the hospital but was not put on any medication.  Past Medical History:  Diagnosis Date  . GASTROESOPHAGEAL REFLUX, NO ESOPHAGITIS 04/21/2006   Qualifier: Diagnosis of  By: Levada Schilling    . GERD (gastroesophageal reflux disease) Dx 1995  . HTN (hypertension)     Patient Active Problem List   Diagnosis Date Noted  . New onset type 2 diabetes mellitus (HCC) 09/13/2016  . Traumatic rhabdomyolysis (HCC) 07/23/2016  . Depression 07/08/2016  . Cocaine use   . Hot flash, menopausal 06/22/2016  . Intertrigo 06/22/2016  . Gout of left ankle 10/09/2015  . Left hip pain 09/02/2015  . EtOH dependence (HCC) 09/02/2015  . Colonoscopy refused 09/02/2015  . Hyperuricemia 05/27/2015  . 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 Pain and swelling of left ankle 12/02/2014  . Tendinitis of right hip flexor 07/23/2014  . Allergic rhinitis 05/13/2014  . GERD (gastroesophageal reflux disease) 04/01/2014  . Tendonitis, Achilles, right 04/01/2014  .  Osteoarthritis of left knee 12/11/2013  . Ex-smoker 11/09/2008  . OBESITY 08/12/2006  . HYPERTENSION, BENIGN ESSENTIAL 08/12/2006    History reviewed. No pertinent surgical history.   OB History   None      Home Medications    Prior to Admission medications   Medication Sig Start Date End Date Taking? Authorizing Provider  amLODipine (NORVASC) 10 MG tablet Take 1 tablet (10 mg total) by mouth daily. 08/09/17   Hoy Register, MD  aspirin EC 81 MG tablet Take 1 tablet (81 mg total) by mouth daily. 10/26/12   Rai, Ripudeep K, MD  cloNIDine (CATAPRES) 0.1 MG tablet Take 1 tablet (0.1 mg total) by mouth at bedtime as needed. For hot flashes 06/21/17   Hoy Register, MD  cyclobenzaprine (FLEXERIL) 5 MG tablet Take 1 tablet (5 mg total) by mouth 3 (three) times daily as needed for muscle spasms. 07/19/17   Eustace Moore, MD  ibuprofen (ADVIL,MOTRIN) 800 MG tablet Take 1 tablet (800 mg total) by mouth every 8 (eight) hours as needed for moderate pain. Take with food 07/19/17   Eustace Moore, MD  metFORMIN (GLUCOPHAGE) 500 MG tablet Take 1 tablet (500 mg total) by mouth daily with breakfast. 06/21/17   Hoy Register, MD  oxyCODONE (ROXICODONE) 5 MG immediate release tablet Take 1 tablet (5 mg total) by mouth every 4 (four) hours as needed for severe pain. 07/19/17   Delton See,  Letta Pate, MD  pantoprazole (PROTONIX) 20 MG tablet TAKE 1 TABLET BY MOUTH 2 TIMES DAILY BEFORE A MEAL 06/21/17   Hoy Register, MD  terbinafine (LAMISIL AT) 1 % cream Apply 1 application topically 2 (two) times daily. For 14 days. 03/25/17   Lizbeth Bark, FNP    Family History Family History  Problem Relation Age of Onset  . CAD Mother     Social History Social History   Tobacco Use  . Smoking status: Former Smoker    Last attempt to quit: 06/25/2012    Years since quitting: 5.2  . Smokeless tobacco: Never Used  Substance Use Topics  . Alcohol use: Yes    Alcohol/week: 1.8 oz    Types: 3  Standard drinks or equivalent per week    Comment: ADMITS TO DRINKING 3-4 BEERS/DAY  . Drug use: No     Allergies   Losartan potassium; Levaquin [levofloxacin in d5w]; Ace inhibitors; Codeine; Cefepime; and Vancomycin   Review of Systems Review of Systems  All other systems reviewed and are negative.    Physical Exam Updated Vital Signs BP 118/78 (BP Location: Right Arm)   Pulse 82   Temp 98.1 F (36.7 C) (Oral)   Resp 17   Ht 5\' 2"  (1.575 m)   LMP 12/17/2014   SpO2 100%   BMI 35.70 kg/m   Physical Exam  Nursing note and vitals reviewed.  52 year old female, resting comfortably and in no acute distress. Vital signs are normal. Oxygen saturation is 100%, which is normal. Head is normocephalic and atraumatic. PERRLA, EOMI. Oropharynx is clear.  Superficial lacerations noted right side of the lower lip and left side of tongue.  These do not require any closure. Neck is nontender and supple without adenopathy or JVD. Back is nontender and there is no CVA tenderness. Lungs are clear without rales, wheezes, or rhonchi. Chest is nontender. Heart has regular rate and rhythm without murmur. Abdomen is soft, flat, nontender without masses or hepatosplenomegaly and peristalsis is normoactive. Extremities have no cyanosis or edema, full range of motion is present. Skin is warm and dry without rash. Neurologic: Mental status is normal, cranial nerves are intact, there are no motor or sensory deficits.  ED Treatments / Results  Labs (all labs ordered are listed, but only abnormal results are displayed) Labs Reviewed  BASIC METABOLIC PANEL - Abnormal; Notable for the following components:      Result Value   Potassium 2.9 (*)    Glucose, Bld 152 (*)    Creatinine, Ser 1.33 (*)    Calcium 7.9 (*)    GFR calc non Af Amer 45 (*)    GFR calc Af Amer 52 (*)    Anion gap 17 (*)    All other components within normal limits  CBC - Abnormal; Notable for the following components:    RDW 15.9 (*)    All other components within normal limits  RAPID URINE DRUG SCREEN, HOSP PERFORMED - Abnormal; Notable for the following components:   Barbiturates   (*)    Value: Result not available. Reagent lot number recalled by manufacturer.   All other components within normal limits  CK - Abnormal; Notable for the following components:   Total CK 438 (*)    All other components within normal limits  MAGNESIUM - Abnormal; Notable for the following components:   Magnesium 1.1 (*)    All other components within normal limits  HCG, QUANTITATIVE, PREGNANCY - Abnormal; Notable  for the following components:   hCG, Beta Chain, Quant, S 6 (*)    All other components within normal limits  CBG MONITORING, ED - Abnormal; Notable for the following components:   Glucose-Capillary 150 (*)    All other components within normal limits  I-STAT BETA HCG BLOOD, ED ([facility], [facility], [facility] ONLY) - Abnormal; Notable for the following components:   I-stat hCG, quantitative 8.4 (*)    All other components within normal limits  HEPATIC FUNCTION PANEL  ETHANOL   Radiology Ct Head Wo Contrast  Result Date: 09/06/2017 CLINICAL DATA:  52 year old female with seizures. EXAM: CT HEAD WITHOUT CONTRAST TECHNIQUE: Contiguous axial images were obtained from the base of the skull through the vertex without intravenous contrast. COMPARISON:  Brain MRI dated 07/09/2016 FINDINGS: Brain: The ventricles and sulci appropriate size for patient's age. Minimal periventricular and deep white matter chronic microvascular ischemic changes noted. 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 There is no acute intracranial hemorrhage. No mass effect or midline shift. No extra-axial fluid collection. Partially empty sella. Vascular: No hyperdense vessel or unexpected calcification. Skull: Normal. Negative for fracture or focal lesion. Sinuses/Orbits: No acute finding. Other: None IMPRESSION: 1. No  acute intracranial pathology. 2. 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 Electronically Signed   By: Elgie Collard M.D.   On: 09/06/2017 04:51    Procedures Procedures   Medications Ordered in ED Medications  magnesium sulfate IVPB 2 g 50 mL (has no administration in time range)  sodium chloride 0.9 % bolus 1,000 mL (has no administration in time range)  levETIRAcetam (KEPPRA) IVPB 1000 mg/100 mL premix (0 mg Intravenous Stopped 09/06/17 0503)  potassium chloride 10 mEq in 100 mL IVPB (0 mEq Intravenous Stopped 09/06/17 0546)  potassium chloride SA (K-DUR,KLOR-CON) CR tablet 40 mEq (40 mEq Oral Given 09/06/17 0441)     Initial Impression / Assessment and Plan / ED Course  I have reviewed the triage vital signs and the nursing notes.  Pertinent labs & imaging results that were available during my care of the patient were reviewed by me and considered in my medical decision making (see chart for details).  Seizure.  History of cocaine and ethanol use.  Old records are reviewed confirming hospitalization for seizures 1 year ago.  At that time, EEG was normal.  Neurologist felt seizures were related to combination of alcohol and cocaine and did not recommend anticonvulsant treatment.  Today, labs show mild renal insufficiency which is new, and moderate to severe hypokalemia.  She is given oral and intravenous potassium.  Point-of-care hCG level is minimally elevated - we will check main lab hCG.  Will repeat CT of head and check CK and magnesium levels as well as drug screen.  She is given loading dose of levetiracetam.  At this point, with recurrent seizure, I feel she will need to be maintained on anticonvulsants.  Magnesium level is come back very low, and she is given intravenous magnesium.  Drug screen is negative.  She is given IV fluids as well.  hCG level has come back minimally elevated at 6.  This  probably is not significant, but I have recommended that she have pregnancy test done in 2 weeks and in 4 weeks to make sure that there is not early pregnancy.  CK is mildly elevated consistent with seizure.  She is discharged with prescriptions for  levetiracetam, K-Dur, and Slow-Mag.  Advised to follow-up with her PCP in 1 week to recheck her creatinine.  Final Clinical Impressions(s) / ED Diagnoses   Final diagnoses:  Seizure (HCC)  Hypokalemia  Hypomagnesemia  Renal insufficiency    ED Discharge Orders        Ordered    levETIRAcetam (KEPPRA) 500 MG tablet  2 times daily     09/06/17 0717    potassium chloride SA (K-DUR,KLOR-CON) 20 MEQ tablet  2 times daily     09/06/17 0717    magnesium chloride (SLOW-MAG) 64 MG TBEC SR tablet  2 times daily     09/06/17 0717       Dione Booze, MD 09/06/17 [phone]

## 2017-09-06 NOTE — ED Notes (Signed)
Pt discharged from ED; instructions provided and scripts given; Pt encouraged to return to ED if symptoms worsen and to f/u with PCP; Pt verbalized understanding of all instructions 

## 2017-09-06 NOTE — ED Provider Notes (Signed)
  Physical Exam  BP (!) 139/94 (BP Location: Right Arm)   Pulse 97   Temp 98.3 F (36.8 C) (Oral)   Resp 18   Ht 5\' 2"  (1.575 m)   LMP 12/17/2014   SpO2 96%   BMI 35.70 kg/m   Physical Exam  ED Course/Procedures     Procedures  MDM  Assuming care of patient from Dr. Preston Fleeting.   Patient in the ED for seizures. Workup thus far shows low magnesium, low potassium.  History of seizures use and polysubstance abuse.  Concerning findings are as following low Mag and K. Important pending results are none  According to Dr. Lenor Derrick, plan is to d/c pt with prescriptions when she is done with her IV infusions. Patient had no complains, no concerns from the nursing side. Will continue to monitor.        Derwood Kaplan, MD 09/06/17 [phone]

## 2017-09-12 MED FILL — cloNIDine HCL 0.1 MG TABS: 0.1 | 30 days supply | Qty: 30 | Fill #1

## 2017-09-12 MED FILL — AMLODIPINE BESYLATE 10 MG T: 10 | 30 days supply | Qty: 30 | Fill #1

## 2017-09-12 MED FILL — PANTOPRAZOLE SOD DR 20 MG T: 20 | 30 days supply | Qty: 60 | Fill #2

## 2017-09-15 ENCOUNTER — Ambulatory Visit: Payer: Self-pay | Attending: Internal Medicine | Admitting: Physician Assistant

## 2017-09-15 VITALS — BP 117/80 | HR 87 | Temp 98.2°F | Resp 18 | Ht 62.0 in | Wt 193.0 lb

## 2017-09-15 DIAGNOSIS — R569 Unspecified convulsions: Secondary | ICD-10-CM | POA: Insufficient documentation

## 2017-09-15 DIAGNOSIS — R799 Abnormal finding of blood chemistry, unspecified: Secondary | ICD-10-CM

## 2017-09-15 DIAGNOSIS — Z09 Encounter for follow-up examination after completed treatment for conditions other than malignant neoplasm: Secondary | ICD-10-CM | POA: Insufficient documentation

## 2017-09-15 DIAGNOSIS — K219 Gastro-esophageal reflux disease without esophagitis: Secondary | ICD-10-CM | POA: Insufficient documentation

## 2017-09-15 DIAGNOSIS — Z79899 Other long term (current) drug therapy: Secondary | ICD-10-CM | POA: Insufficient documentation

## 2017-09-15 DIAGNOSIS — Z7289 Other problems related to lifestyle: Secondary | ICD-10-CM | POA: Insufficient documentation

## 2017-09-15 DIAGNOSIS — Z7984 Long term (current) use of oral hypoglycemic drugs: Secondary | ICD-10-CM | POA: Insufficient documentation

## 2017-09-15 DIAGNOSIS — F1410 Cocaine abuse, uncomplicated: Secondary | ICD-10-CM | POA: Insufficient documentation

## 2017-09-15 DIAGNOSIS — E119 Type 2 diabetes mellitus without complications: Secondary | ICD-10-CM | POA: Insufficient documentation

## 2017-09-15 DIAGNOSIS — Z885 Allergy status to narcotic agent status: Secondary | ICD-10-CM | POA: Insufficient documentation

## 2017-09-15 DIAGNOSIS — I10 Essential (primary) hypertension: Secondary | ICD-10-CM | POA: Insufficient documentation

## 2017-09-15 DIAGNOSIS — Z881 Allergy status to other antibiotic agents status: Secondary | ICD-10-CM | POA: Insufficient documentation

## 2017-09-15 DIAGNOSIS — IMO0002 Reserved for concepts with insufficient information to code with codable children: Secondary | ICD-10-CM

## 2017-09-15 DIAGNOSIS — Z789 Other specified health status: Secondary | ICD-10-CM

## 2017-09-15 DIAGNOSIS — Z7982 Long term (current) use of aspirin: Secondary | ICD-10-CM | POA: Insufficient documentation

## 2017-09-15 LAB — GLUCOSE, POCT (MANUAL RESULT ENTRY): POC Glucose: 148 mg/dl — AB (ref 70–99)

## 2017-09-15 MED ORDER — LEVETIRACETAM 500 MG PO TABS: 500.0000 mg | ORAL_TABLET | Freq: Two times a day (BID) | ORAL | 2 refills | Status: DC

## 2017-09-15 NOTE — Progress Notes (Signed)
Patient ID: Christina Byrd, female   DOB: 10-13-1965, 52 y.o.   MRN: [id number]  ED 09/05/2017 Patient in the ED for seizures. Workup thus far shows low magnesium, low potassium.  History of seizures use and polysubstance abuse.  Concerning findings are as following low Mag and K. Important pending results are none  According to Dr. Lenor Byrd, plan is to d/c pt with prescriptions when she is done with her IV infusions. Patient had no complains, no concerns from the nursing side. Will continue to monitor.  She has a history of hypertension, diabetes, alcohol use, cocaine use and comes in after having had a seizure at home.  Husband witnessed a generalized seizure which lasted about 5 minutes.  She did bite her lip and tongue but did not have any incontinence.  Has been relates a postictal state which lasted about 10 minutes.  She is back to her normal mental status.  She does admit to drinking 36 ounces of beer a day but denies recent cocaine use.  However, she states that she had been cleaning a house and there was some powder and wonders if she might of been exposed to cocaine.  She did have a seizure about 1 year ago and was admitted to the hospital but was not put on any medication.  From A/P: Seizure.  History of cocaine and ethanol use.  Old records are reviewed confirming hospitalization for seizures 1 year ago.  At that time, EEG was normal.  Neurologist felt seizures were related to combination of alcohol and cocaine and did not recommend anticonvulsant treatment.  Today, labs show mild renal insufficiency which is new, and moderate to severe hypokalemia.  She is given oral and intravenous potassium.  Point-of-care hCG level is minimally elevated - we will check main lab hCG.  Will repeat CT of head and check CK and magnesium levels as well as drug screen.  She is given loading dose of levetiracetam.  At this point, with recurrent seizure, I feel she will need to be maintained on  anticonvulsants.  Magnesium level is come back very low, and she is given intravenous magnesium.  Drug screen is negative.  She is given IV fluids as well.  hCG level has come back minimally elevated at 6.  This probably is not significant, but I have recommended that she have pregnancy test done in 2 weeks and in 4 weeks to make sure that there is not early pregnancy.  CK is mildly elevated consistent with seizure.  She is discharged with prescriptions for levetiracetam, K-Dur, and Slow-Mag.  Advised to follow-up with her PCP in 1 week to recheck her creatinine.

## 2017-09-15 NOTE — Progress Notes (Signed)
Patient ID: Christina Byrd, female   DOB: April 19, 1965, 52 y.o.   MRN: [id number]   Christina Byrd, is a 52 y.o. female  [id number]  GMW:[id number]  DOB - 02/15/1966  Subjective:  Chief Complaint and HPI: Christina Byrd is a 52 y.o. female here today for a follow up visit after being seen in the ED 09/05/2017 for seizure.  This is the second seizure she has ever had.  They are believed to be alcohol withdrawal seizures but have never been worked up by neurology.  She admits to daily alcohol consumption in the evenings.  +several beers and several shots most days but says she can quit whenever she wants.  Also has a h/o cocaine abuse but denies recent use.  Feeling fine since ED visit.  She had a rash on her face and abdomen immediately following her ED visit  that was mildy itching but seems to be fading/resolving on its own(not visible today).  No f/c.  No tick bites.    ED/Hospital notes reviewed and summarized above. .    ROS:   Constitutional:  No f/c, No night sweats, No unexplained weight loss. EENT:  No vision changes, No blurry vision, No hearing changes. No mouth, throat, or ear problems.  Respiratory: No cough, No SOB Cardiac: No CP, no palpitations GI:  No abd pain, No N/V/D. GU: No Urinary s/sx Musculoskeletal: No joint pain Neuro: No headache, no dizziness, no motor weakness.  Skin: resolving rash Endocrine:  No polydipsia. No polyuria.  Psych: Denies SI/HI  No problems updated.  ALLERGIES: Allergies  Allergen Reactions  . Losartan Potassium     Rash  . Levaquin [Levofloxacin In D5w] Rash  . Ace Inhibitors Cough    REACTION: cough  . Codeine Nausea Only  . Cefepime Rash    09/04/12 pm Patient started to break out with small macules after IV Vanco infusion, then macules increased in size after starting cefepime infusion.  . Vancomycin Rash    09/04/12 pm Patient started to break out with small macules after IV Vanco infusion, then macules increased in size  after starting cefepime infusion.    PAST MEDICAL HISTORY: Past Medical History:  Diagnosis Date  . GASTROESOPHAGEAL REFLUX, NO ESOPHAGITIS 04/21/2006   Qualifier: Diagnosis of  By: Levada Schilling    . GERD (gastroesophageal reflux disease) Dx 1995  . HTN (hypertension)     MEDICATIONS AT HOME: Prior to Admission medications   Medication Sig Start Date End Date Taking? Authorizing Provider  amLODipine (NORVASC) 10 MG tablet Take 1 tablet (10 mg total) by mouth daily. 08/09/17   Hoy Register, MD  aspirin EC 81 MG tablet Take 1 tablet (81 mg total) by mouth daily. 10/26/12   Rai, Ripudeep K, MD  cloNIDine (CATAPRES) 0.1 MG tablet Take 1 tablet (0.1 mg total) by mouth at bedtime as needed. For hot flashes 06/21/17   Hoy Register, MD  levETIRAcetam (Christina Byrd) 500 MG tablet Take 1 tablet (500 mg total) by mouth 2 (two) times daily. 09/15/17   Anders Simmonds, PA-C  magnesium chloride (SLOW-MAG) 64 MG TBEC SR tablet Take 2 tablets (128 mg total) by mouth 2 (two) times daily. 09/06/17   Dione Booze, MD  metFORMIN (GLUCOPHAGE) 500 MG tablet Take 1 tablet (500 mg total) by mouth daily with breakfast. 06/21/17   Hoy Register, MD  pantoprazole (PROTONIX) 20 MG tablet TAKE 1 TABLET BY MOUTH 2 TIMES DAILY BEFORE A MEAL 06/21/17   Hoy Register, MD  potassium chloride SA (  K-DUR,KLOR-CON) 20 MEQ tablet Take 1 tablet (20 mEq total) by mouth 2 (two) times daily. 09/06/17   Dione Booze, MD     Objective:  EXAM:   Vitals:   09/15/17 1458  BP: 117/80  Pulse: 87  Resp: 18  Temp: 98.2 F (36.8 C)  TempSrc: Oral  SpO2: 97%  Weight: 193 lb (87.5 kg)  Height: 5\' 2"  (1.575 m)    General appearance : A&OX3. NAD. Non-toxic-appearing HEENT: Atraumatic and Normocephalic.  PERRLA. EOM intact.   Neck: supple, no JVD. No cervical lymphadenopathy. No thyromegaly Chest/Lungs:  Breathing-non-labored, Good air entry bilaterally, breath sounds normal without rales, rhonchi, or wheezing  CVS: S1 S2 regular,  no murmurs, gallops, rubs  Extremities: Bilateral Lower Ext shows no edema, both legs are warm to touch with = pulse throughout Neurology:  CN II-XII grossly intact, Non focal.   Psych: J/I poor to  fair. Loud speech. Appropriate eye contact and affect.  Skin:  No visible Rash/drug exanthem  Data Review Lab Results  Component Value Date   HGBA1C 6.6 06/21/2017   HGBA1C 6.2 03/25/2017   HGBA1C 7.0 12/23/2016     Assessment & Plan   1. Seizure (HCC) Likely alcohol withdrawal related - Ambulatory referral to Neurology -continue for Christina Byrd for now.    2. Controlled type 2 diabetes mellitus without complication, without long-term current use of insulin (HCC) Will check A1C today and adjust meds if needed - Glucose (CBG) - Hemoglobin A1c  3. HYPERTENSION, BENIGN ESSENTIAL Controlled today.  Continue current regimen - Basic metabolic panel  4. Alcohol use I have counseled the patient at length about substance abuse and addiction.  12 step meetings/recovery recommended.  Local 12 step meeting lists were given and attendance was encouraged.  Patient expresses understanding. She says she can quit on her own   5. Hypomagnesemia - Magnesium  6. Abnormal human chorionic gonadotropin (hCG) Had abnormal level to be followed - Beta hCG quant (ref lab)  7. Encounter for examination following treatment at hospital Much improved.       Patient have been counseled extensively about nutrition and exercise  Return in about 3 months (around 12/16/2017) for Dr Alvis Lemmings for DM and htn.  The patient was given clear instructions to go to ER or return to medical center if symptoms don't improve, worsen or new problems develop. The patient verbalized understanding. The patient was told to call to get lab results if they haven't heard anything in the next week.     Georgian Co, PA-C [facility] [place], [place] [phone]   09/15/2017, 3:11 PM

## 2017-09-16 ENCOUNTER — Encounter: Payer: Self-pay | Admitting: Neurology

## 2017-09-16 LAB — BASIC METABOLIC PANEL
BUN/Creatinine Ratio: 11 (ref 9–23)
BUN: 11 mg/dL (ref 6–24)
CO2: 19 mmol/L — ABNORMAL LOW (ref 20–29)
CREATININE: 0.97 mg/dL (ref 0.57–1.00)
Calcium: 9.6 mg/dL (ref 8.7–10.2)
Chloride: 103 mmol/L (ref 96–106)
GFR calc Af Amer: 78 mL/min/{1.73_m2} (ref >59)
GFR, EST NON AFRICAN AMERICAN: 67 mL/min/{1.73_m2} (ref >59)
Glucose: 123 mg/dL — ABNORMAL HIGH (ref 65–99)
Potassium: 5 mmol/L (ref 3.5–5.2)
SODIUM: 143 mmol/L (ref 134–144)

## 2017-09-16 LAB — BETA HCG QUANT (REF LAB): HCG QUANT: 4 m[IU]/mL

## 2017-09-16 LAB — MAGNESIUM: MAGNESIUM: 1.4 mg/dL — AB (ref 1.6–2.3)

## 2017-09-16 LAB — HEMOGLOBIN A1C
ESTIMATED AVERAGE GLUCOSE: 148 mg/dL
HEMOGLOBIN A1C: 6.8 % — AB (ref 4.8–5.6)

## 2017-09-20 ENCOUNTER — Other Ambulatory Visit: Payer: Self-pay | Admitting: Physician Assistant

## 2017-09-20 ENCOUNTER — Ambulatory Visit: Payer: Self-pay | Admitting: Family Medicine

## 2017-09-20 ENCOUNTER — Telehealth: Payer: Self-pay | Admitting: *Deleted

## 2017-09-20 DIAGNOSIS — E119 Type 2 diabetes mellitus without complications: Secondary | ICD-10-CM

## 2017-09-20 MED ORDER — METFORMIN HCL 500 MG PO TABS: 500.0000 mg | ORAL_TABLET | Freq: Two times a day (BID) | ORAL | 6 refills | Status: DC

## 2017-09-20 MED ORDER — MAGNESIUM CHLORIDE 64 MG PO TBEC: 2.0000 | DELAYED_RELEASE_TABLET | Freq: Every day | ORAL | 0 refills | Status: DC

## 2017-09-20 MED FILL — ?METFORMIN HCL 500MG TABS: 500 | 30 days supply | Qty: 60 | Fill #0

## 2017-09-20 NOTE — Telephone Encounter (Signed)
-----   Message from Anders Simmonds, [place] sent at 09/20/2017  7:53 AM EDT ----- Please call patient.  Her magnesium was low and her diabetes is not controlled.  I sent her a prescription for magnesium to take for a month.  I am increasing her dose of metformin to 500mg  twice daily.  Eliminate sugars and stay off of alcohol.  Follow-up as planned.  Thanks, Georgian Co, PA-C

## 2017-09-20 NOTE — Telephone Encounter (Signed)
Medical Assistant left message on patient's home and cell voicemail. Voicemail states to give a call back to [place] with [facility] at [phone]. Patient is aware of magnesium being low and needing to take a supplement for one month. Patient is also aware of increasing metformin to twice a day.

## 2017-09-21 ENCOUNTER — Ambulatory Visit: Payer: Self-pay

## 2017-09-26 ENCOUNTER — Telehealth: Payer: Self-pay | Admitting: Internal Medicine

## 2017-09-26 NOTE — Telephone Encounter (Signed)
Pt called to request a call back from the nurse she saw in 09/15/2017 due to a medication she does not know the name of or what its for, it was a new medication given to her that she could not have it refilled on our pharmacy, please follow up

## 2017-09-27 NOTE — Telephone Encounter (Signed)
Patient verified DOB Patient is unable to confirm the name of the medication nor what the medication is for.  Patient states she will return with Paperwork and ask the pharmacy.

## 2017-09-28 ENCOUNTER — Other Ambulatory Visit: Payer: Self-pay

## 2017-09-28 ENCOUNTER — Ambulatory Visit: Payer: Self-pay

## 2017-10-05 MED FILL — levETIRAcetam 500 MG TABS: 500 | 30 days supply | Qty: 60 | Fill #0

## 2017-10-05 MED FILL — ?CLONIDINE HCL 0.1 MG TABL: 0.1 | 30 days supply | Qty: 30 | Fill #2

## 2017-10-07 ENCOUNTER — Ambulatory Visit: Payer: Self-pay | Attending: Family Medicine

## 2017-10-07 ENCOUNTER — Telehealth: Payer: Self-pay | Admitting: Internal Medicine

## 2017-10-07 NOTE — Telephone Encounter (Addendum)
Pt came in to request medication advice

## 2017-10-14 ENCOUNTER — Other Ambulatory Visit: Payer: Self-pay | Admitting: Family Medicine

## 2017-10-14 DIAGNOSIS — K219 Gastro-esophageal reflux disease without esophagitis: Secondary | ICD-10-CM

## 2017-10-14 MED FILL — PANTOPRAZOLE SOD DR 20 MG T: 20 | 30 days supply | Qty: 60 | Fill #0

## 2017-10-14 MED FILL — AMLODIPINE BESYLATE 10 MG T: 10 | 30 days supply | Qty: 30 | Fill #2

## 2017-10-27 ENCOUNTER — Ambulatory Visit: Payer: Self-pay | Attending: Family Medicine | Admitting: Physician Assistant

## 2017-10-27 VITALS — BP 125/83 | HR 97 | Temp 99.0°F | Resp 18 | Ht 64.0 in | Wt 186.0 lb

## 2017-10-27 DIAGNOSIS — I10 Essential (primary) hypertension: Secondary | ICD-10-CM | POA: Insufficient documentation

## 2017-10-27 DIAGNOSIS — Z7984 Long term (current) use of oral hypoglycemic drugs: Secondary | ICD-10-CM | POA: Insufficient documentation

## 2017-10-27 DIAGNOSIS — K219 Gastro-esophageal reflux disease without esophagitis: Secondary | ICD-10-CM | POA: Insufficient documentation

## 2017-10-27 DIAGNOSIS — E119 Type 2 diabetes mellitus without complications: Secondary | ICD-10-CM | POA: Insufficient documentation

## 2017-10-27 DIAGNOSIS — Z7982 Long term (current) use of aspirin: Secondary | ICD-10-CM | POA: Insufficient documentation

## 2017-10-27 DIAGNOSIS — Z79899 Other long term (current) drug therapy: Secondary | ICD-10-CM | POA: Insufficient documentation

## 2017-10-27 DIAGNOSIS — M25561 Pain in right knee: Secondary | ICD-10-CM | POA: Insufficient documentation

## 2017-10-27 DIAGNOSIS — Z885 Allergy status to narcotic agent status: Secondary | ICD-10-CM | POA: Insufficient documentation

## 2017-10-27 DIAGNOSIS — Z888 Allergy status to other drugs, medicaments and biological substances status: Secondary | ICD-10-CM | POA: Insufficient documentation

## 2017-10-27 DIAGNOSIS — M109 Gout, unspecified: Secondary | ICD-10-CM | POA: Insufficient documentation

## 2017-10-27 DIAGNOSIS — Z881 Allergy status to other antibiotic agents status: Secondary | ICD-10-CM | POA: Insufficient documentation

## 2017-10-27 LAB — GLUCOSE, POCT (MANUAL RESULT ENTRY): POC GLUCOSE: 119 mg/dL — AB (ref 70–99)

## 2017-10-27 MED ORDER — INDOMETHACIN 50 MG PO CAPS: 50.0000 mg | ORAL_CAPSULE | Freq: Three times a day (TID) | ORAL | 1 refills | Status: DC

## 2017-10-27 MED FILL — INDOMETHACIN 50 MG CAPSULE: 50 | 10 days supply | Qty: 30 | Fill #0

## 2017-10-27 NOTE — Patient Instructions (Signed)

## 2017-10-27 NOTE — Progress Notes (Signed)
Patient ID: Christina Byrd, female   Christina: 1965/07/16, 52 y.o.   MRN: [id number]   Christina Byrd, is a 52 y.o. female  Christina Byrd  Christina Byrd  Christina Byrd  Subjective:  Chief Complaint and HPI: Christina Byrd is a 52 y.o. female here today with gout pain R knee. Knee became R and swollen over the last couple of days.  No f/c.  +pain with bending and weight bearing.  She has had gout in her L knee before and this feels the same.    ROS:   Constitutional:  No f/c, No night sweats, No unexplained weight loss. EENT:  No vision changes, No blurry vision, No hearing changes. No mouth, throat, or ear problems.  Respiratory: No cough, No SOB Cardiac: No CP, no palpitations GI:  No abd pain, No N/V/D. GU: No Urinary s/sx Musculoskeletal: No joint pain Neuro: No headache, no dizziness, no motor weakness.  Skin: No rash Endocrine:  No polydipsia. No polyuria.  Psych: Denies SI/HI  No problems updated.  ALLERGIES: Allergies  Allergen Reactions  . Losartan Potassium     Rash  . Levaquin [Levofloxacin In D5w] Rash  . Ace Inhibitors Cough    REACTION: cough  . Codeine Nausea Only  . Cefepime Rash    09/04/12 pm Patient started to break out with small macules after IV Vanco infusion, then macules increased in size after starting cefepime infusion.  . Vancomycin Rash    09/04/12 pm Patient started to break out with small macules after IV Vanco infusion, then macules increased in size after starting cefepime infusion.    PAST MEDICAL HISTORY: Past Medical History:  Diagnosis Date  . GASTROESOPHAGEAL REFLUX, NO ESOPHAGITIS 04/21/2006   Qualifier: Diagnosis of  By: Levada Schilling    . GERD (gastroesophageal reflux disease) Dx 1995  . HTN (hypertension)     MEDICATIONS AT HOME: Prior to Admission medications   Medication Sig Start Date End Date Taking? Authorizing Provider  amLODipine (NORVASC) 10 MG tablet Take 1 tablet (10 mg total) by mouth daily. 08/09/17   Hoy Register, MD  aspirin EC 81 MG tablet Take 1 tablet (81 mg total) by mouth daily. 10/26/12   Rai, Ripudeep K, MD  cloNIDine (CATAPRES) 0.1 MG tablet Take 1 tablet (0.1 mg total) by mouth at bedtime as needed. For hot flashes 06/21/17   Hoy Register, MD  indomethacin (INDOCIN) 50 MG capsule Take 1 capsule (50 mg total) by mouth 3 (three) times daily with meals. Prn pain with gout 10/27/17   Anders Simmonds, PA-C  levETIRAcetam (KEPPRA) 500 MG tablet Take 1 tablet (500 mg total) by mouth 2 (two) times daily. 09/15/17   Anders Simmonds, PA-C  magnesium chloride (SLOW-MAG) 64 MG TBEC SR tablet Take 2 tablets (128 mg total) by mouth daily. 09/20/17   Anders Simmonds, PA-C  metFORMIN (GLUCOPHAGE) 500 MG tablet Take 1 tablet (500 mg total) by mouth 2 (two) times daily with a meal. 09/20/17   McClung, Marzella Schlein, PA-C  pantoprazole (PROTONIX) 20 MG tablet TAKE 1 TABLET BY MOUTH 2 TIMES DAILY BEFORE A MEAL 10/14/17   Hoy Register, MD  potassium chloride SA (K-DUR,KLOR-CON) 20 MEQ tablet Take 1 tablet (20 mEq total) by mouth 2 (two) times daily. 09/06/17   Dione Booze, MD     Objective:  EXAM:   Vitals:   10/27/17 1447  BP: 125/83  Pulse: 97  Resp: 18  Temp: 99 F (37.2 C)  TempSrc: Oral  SpO2:  100%  Weight: 186 lb (84.4 kg)  Height: 5\' 4"  (1.626 m)    General appearance : A&OX3. NAD. Non-toxic-appearing HEENT: Atraumatic and Normocephalic.  PERRLA. EOM intact.  Neck: supple, no JVD. No cervical lymphadenopathy. No thyromegaly Chest/Lungs:  Breathing-non-labored, Good air entry bilaterally, breath sounds normal without rales, rhonchi, or wheezing  CVS: S1 S2 regular, no murmurs, gallops, rubs  R knee that is warm to the touch and pain with passive and active ROM.   Neurology:  CN II-XII grossly intact, Non focal.   Psych:  TP linear. J/I WNL. Normal speech. Appropriate eye contact and affect.  Skin:  No Rash  Data Review Lab Results  Component Value Date   HGBA1C 6.8 (H) 09/15/2017    HGBA1C 6.6 06/21/2017   HGBA1C 6.2 03/25/2017     Assessment & Plan   1. Acute gout of right knee, unspecified cause No sign of infection/effusion - indomethacin (INDOCIN) 50 MG capsule; Take 1 capsule (50 mg total) by mouth 3 (three) times daily with meals. Prn pain with gout  Dispense: 30 capsule; Refill: 1  2. Controlled type 2 diabetes mellitus without complication, without long-term current use of insulin (Christina Byrd) Controlled.  Continue current regimen.   - Glucose (CBG)   Patient have been counseled extensively about nutrition and exercise  Return for keep 10/23 appt.  The patient was given clear instructions to go to ER or return to medical center if symptoms don't improve, worsen or new problems develop. The patient verbalized understanding. The patient was told to call to get lab results if they haven't heard anything in the next week.     Georgian Co, PA-C [facility] [place], [place] [phone]   10/27/2017, 3:10 PM

## 2017-11-10 ENCOUNTER — Encounter (HOSPITAL_COMMUNITY): Payer: Self-pay | Admitting: Emergency Medicine

## 2017-11-10 ENCOUNTER — Emergency Department (HOSPITAL_COMMUNITY)
Admission: EM | Admit: 2017-11-10 | Discharge: 2017-11-10 | Disposition: A | Discharge status: Home / Self Care | Payer: Self-pay | Attending: Emergency Medicine | Admitting: Emergency Medicine

## 2017-11-10 ENCOUNTER — Other Ambulatory Visit: Payer: Self-pay

## 2017-11-10 DIAGNOSIS — I10 Essential (primary) hypertension: Secondary | ICD-10-CM | POA: Insufficient documentation

## 2017-11-10 DIAGNOSIS — R569 Unspecified convulsions: Secondary | ICD-10-CM | POA: Insufficient documentation

## 2017-11-10 DIAGNOSIS — Z87891 Personal history of nicotine dependence: Secondary | ICD-10-CM | POA: Insufficient documentation

## 2017-11-10 DIAGNOSIS — Z79899 Other long term (current) drug therapy: Secondary | ICD-10-CM | POA: Insufficient documentation

## 2017-11-10 DIAGNOSIS — Z7982 Long term (current) use of aspirin: Secondary | ICD-10-CM | POA: Insufficient documentation

## 2017-11-10 DIAGNOSIS — E119 Type 2 diabetes mellitus without complications: Secondary | ICD-10-CM | POA: Insufficient documentation

## 2017-11-10 LAB — CBC WITH DIFFERENTIAL/PLATELET
Abs Immature Granulocytes: 0 10*3/uL (ref 0.0–0.1)
BASOS ABS: 0.1 10*3/uL (ref 0.0–0.1)
BASOS PCT: 1 %
EOS ABS: 0.1 10*3/uL (ref 0.0–0.7)
Eosinophils Relative: 1 %
HCT: 39.6 % (ref 36.0–46.0)
Hemoglobin: 12.8 g/dL (ref 12.0–15.0)
Immature Granulocytes: 0 %
Lymphocytes Relative: 14 %
Lymphs Abs: 1.2 10*3/uL (ref 0.7–4.0)
MCH: 29.4 pg (ref 26.0–34.0)
MCHC: 32.3 g/dL (ref 30.0–36.0)
MCV: 90.8 fL (ref 78.0–100.0)
MONO ABS: 0.5 10*3/uL (ref 0.1–1.0)
Monocytes Relative: 6 %
NEUTROS ABS: 6.8 10*3/uL (ref 1.7–7.7)
Neutrophils Relative %: 78 %
Platelets: 197 10*3/uL (ref 150–400)
RBC: 4.36 MIL/uL (ref 3.87–5.11)
RDW: 15.9 % — AB (ref 11.5–15.5)
WBC: 8.7 10*3/uL (ref 4.0–10.5)

## 2017-11-10 LAB — COMPREHENSIVE METABOLIC PANEL WITH GFR
ALT: 16 U/L (ref 0–44)
AST: 20 U/L (ref 15–41)
Albumin: 3.9 g/dL (ref 3.5–5.0)
Alkaline Phosphatase: 86 U/L (ref 38–126)
Anion gap: 13 (ref 5–15)
BUN: 15 mg/dL (ref 6–20)
CO2: 23 mmol/L (ref 22–32)
Calcium: 9 mg/dL (ref 8.9–10.3)
Chloride: 104 mmol/L (ref 98–111)
Creatinine, Ser: 0.93 mg/dL (ref 0.44–1.00)
GFR calc Af Amer: >60 mL/min
GFR calc non Af Amer: >60 mL/min
Glucose, Bld: 126 mg/dL — ABNORMAL HIGH (ref 70–99)
Potassium: 4 mmol/L (ref 3.5–5.1)
Sodium: 140 mmol/L (ref 135–145)
Total Bilirubin: 0.8 mg/dL (ref 0.3–1.2)
Total Protein: 7.4 g/dL (ref 6.5–8.1)

## 2017-11-10 LAB — MAGNESIUM: Magnesium: 1.5 mg/dL — ABNORMAL LOW (ref 1.7–2.4)

## 2017-11-10 MED ORDER — FAMOTIDINE IN NACL 20-0.9 MG/50ML-% IV SOLN
20.0000 mg | Freq: Once | INTRAVENOUS | Status: AC
  Administered 2017-11-10: 20 mg via INTRAVENOUS
  Filled 2017-11-10: qty 50

## 2017-11-10 MED ORDER — MAGNESIUM GLUCONATE 500 MG PO TABS: 1000.0000 mg | ORAL_TABLET | Freq: Once | ORAL | Status: DC

## 2017-11-10 MED ORDER — SODIUM CHLORIDE 0.9 % IV BOLUS
1000.0000 mL | Freq: Once | INTRAVENOUS | Status: AC
  Administered 2017-11-10: 1000 mL via INTRAVENOUS

## 2017-11-10 MED ORDER — LEVETIRACETAM IN NACL 1000 MG/100ML IV SOLN
1000.0000 mg | Freq: Once | INTRAVENOUS | Status: AC
  Administered 2017-11-10: 1000 mg via INTRAVENOUS
  Filled 2017-11-10: qty 100

## 2017-11-10 MED ORDER — ONDANSETRON HCL 4 MG/2ML IJ SOLN
4.0000 mg | Freq: Once | INTRAMUSCULAR | Status: AC
  Administered 2017-11-10: 4 mg via INTRAVENOUS
  Filled 2017-11-10: qty 2

## 2017-11-10 NOTE — Discharge Instructions (Addendum)
Please resume taking your anti-seizure medication and follow up with your neurologist for further management.  Take magnesium daily (500 mg- OTC) Department of Motor Vehicle (DMV) of [place] regulations for seizures - It is the patients responsibility to report the incidence of the seizure in the state of [place]. [place] has no statutory provision requiring physicians to report patients diagnosed with epilepsy or seizures to a central state agency.  The recommended DMV regulation requirement for a driver in [place] for an individual with a seizure is that they be seizure-free for 6-12 months. However, the DMV may consider the following exceptions to this general rule where: (1) a physician-directed change in medication causes a seizure and the individual immediately resumes the previous therapy which controlled seizures; (2) there is a history of nocturnal seizures or seizures which do not involve loss of consciousness, loss of control of motor function, or loss of appropriate sensation and information process; and (3) an individual has a seizure disorder preceded by an aura (warning) lasting 2-3 minutes. While the [facility] may also give consideration to other unusual circumstances which may affect the general requirement that drivers be seizure-free for 6-12 months, interpretation of these circumstances and assignment of restrictions is at the discretion of the Medical Advisor. The DMV also considers compliance with medical therapy essential for safe driving. [facility] [place] Physician's Guide to [employer] (June, 1995 ed.)] The Department learns of an individual's condition by inquiring on the application form or renewal form, a physician's report to the [facility], an accident report or from correspondence from the individual. The person may be required to submit a Medical Report Form either annually or semi-annually.  Do not drive, swim, take baths or do any other activities that would be dangerous if you  had another seizure.   Contact a health care provider if: You have another seizure. You have seizures more often. Your seizure symptoms change. You continue to have seizures with treatment. You have symptoms of an infection or illness. They might increase your risk of having a seizure. Get help right away if: You have a seizure: That lasts longer than 5 minutes. That is different than previous seizures. That leaves you unable to speak or use a part of your body. That makes it harder to breathe. After a head injury. You have: Multiple seizures in a row. Confusion or a severe headache right after a seizure. You are having seizures more often. You do not wake up immediately after a seizure. You injure yourself during a seizure.

## 2017-11-10 NOTE — ED Provider Notes (Addendum)
[facility] EMERGENCY DEPARTMENT Provider Note   CSN: [id number] Arrival date & time: 11/10/17  1110     History   Chief Complaint Chief Complaint  Patient presents with  . Seizures    HPI Christina Byrd is a 52 y.o. female.  The history is provided by the patient and medical records. No language interpreter was used.  Seizures       52 year old female with history of alcohol abuse, diabetes, alcohol-related seizures presenting for evaluation of seizure activity.  Patient with history of daily alcohol consumption and occasional cocaine use.  She is currently on Keppra 500 mg by mouth twice daily.  Her last seizure activities was 2 months prior.  Her first seizure activity was a year ago.  It was thought to be related to alcohol use.  Patient states she noticed not feeling well when she takes her seizure medication along with her magnesium medication therefore for the past 2 days she has not been taking her Keppra.  This morning she had a witnessed seizure activity by her husband who report patient's eyes rolling backward and patient was shaky.  She had a urinary incontinence about.  She denies tongue biting.  She has a total of 2 episodes lasting less than a minute each.  EMS did arrive but patient prefers to come to ER with her husband via private vehicle.  She admits to alcohol use last night but denies any significant amount of alcohol consumption.  She denies any recent drug use.  She denies any recent sickness.  She denies any significant pain or discomfort at this time.  She did not fully recall the event.  No urinary discomfort.  Past Medical History:  Diagnosis Date  . GASTROESOPHAGEAL REFLUX, NO ESOPHAGITIS 04/21/2006   Qualifier: Diagnosis of  By: Levada Schilling    . GERD (gastroesophageal reflux disease) Dx 1995  . HTN (hypertension)     Patient Active Problem List   Diagnosis Date Noted  . New onset type 2 diabetes mellitus (HCC) 09/13/2016  .  Traumatic rhabdomyolysis (HCC) 07/23/2016  . Depression 07/08/2016  . Cocaine use   . Hot flash, menopausal 06/22/2016  . Intertrigo 06/22/2016  . Gout of left ankle 10/09/2015  . Left hip pain 09/02/2015  . EtOH dependence (HCC) 09/02/2015  . Colonoscopy refused 09/02/2015  . Hyperuricemia 05/27/2015  . 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 Pain and swelling of left ankle 12/02/2014  . Tendinitis of right hip flexor 07/23/2014  . Allergic rhinitis 05/13/2014  . GERD (gastroesophageal reflux disease) 04/01/2014  . Tendonitis, Achilles, right 04/01/2014  . Osteoarthritis of left knee 12/11/2013  . Ex-smoker 11/09/2008  . OBESITY 08/12/2006  . HYPERTENSION, BENIGN ESSENTIAL 08/12/2006    History reviewed. No pertinent surgical history.   OB History   None      Home Medications    Prior to Admission medications   Medication Sig Start Date End Date Taking? Authorizing Provider  amLODipine (NORVASC) 10 MG tablet Take 1 tablet (10 mg total) by mouth daily. 08/09/17   Hoy Register, MD  aspirin EC 81 MG tablet Take 1 tablet (81 mg total) by mouth daily. 10/26/12   Rai, Ripudeep K, MD  cloNIDine (CATAPRES) 0.1 MG tablet Take 1 tablet (0.1 mg total) by mouth at bedtime as needed. For hot flashes 06/21/17   Hoy Register, MD  indomethacin (INDOCIN) 50 MG capsule Take 1 capsule (50 mg total) by mouth 3 (three) times daily with meals.  Prn pain with gout 10/27/17   Anders Simmonds, PA-C  levETIRAcetam (KEPPRA) 500 MG tablet Take 1 tablet (500 mg total) by mouth 2 (two) times daily. 09/15/17   Anders Simmonds, PA-C  magnesium chloride (SLOW-MAG) 64 MG TBEC SR tablet Take 2 tablets (128 mg total) by mouth daily. 09/20/17   Anders Simmonds, PA-C  metFORMIN (GLUCOPHAGE) 500 MG tablet Take 1 tablet (500 mg total) by mouth 2 (two) times daily with a meal. 09/20/17   McClung, Marzella Schlein, PA-C  pantoprazole (PROTONIX) 20 MG tablet TAKE 1 TABLET BY MOUTH 2 TIMES DAILY BEFORE A MEAL  10/14/17   Hoy Register, MD  potassium chloride SA (K-DUR,KLOR-CON) 20 MEQ tablet Take 1 tablet (20 mEq total) by mouth 2 (two) times daily. 09/06/17   Dione Booze, MD    Family History Family History  Problem Relation Age of Onset  . CAD Mother     Social History Social History   Tobacco Use  . Smoking status: Former Smoker    Last attempt to quit: 06/25/2012    Years since quitting: 5.3  . Smokeless tobacco: Never Used  Substance Use Topics  . Alcohol use: Yes    Alcohol/week: 3.0 standard drinks    Types: 3 Standard drinks or equivalent per week    Comment: ADMITS TO DRINKING 3-4 BEERS/DAY  . Drug use: No     Allergies   Losartan potassium; Levaquin [levofloxacin in d5w]; Ace inhibitors; Codeine; Cefepime; and Vancomycin   Review of Systems Review of Systems  Neurological: Positive for seizures.  All other systems reviewed and are negative.    Physical Exam Updated Vital Signs BP 120/78 (BP Location: Right Arm)   Pulse 91   Temp 98.7 F (37.1 C) (Oral)   Resp 18   LMP 12/17/2014   SpO2 96%   Physical Exam  Constitutional: She is oriented to person, place, and time. She appears well-developed and well-nourished. No distress.  HENT:  Head: Atraumatic.  No tongue injury  Eyes: Conjunctivae are normal.  Neck: Normal range of motion. Neck supple.  No nuchal rigidity  Cardiovascular: Normal rate and regular rhythm.  Pulmonary/Chest: Effort normal and breath sounds normal.  Abdominal: Soft. Bowel sounds are normal. She exhibits no distension. There is no tenderness.  Neurological: She is alert and oriented to person, place, and time. She has normal strength. No cranial nerve deficit or sensory deficit. GCS eye subscore is 4. GCS verbal subscore is 5. GCS motor subscore is 6.  Skin: No rash noted.  Psychiatric: She has a normal mood and affect. Her speech is normal and behavior is normal. Thought content normal.  Nursing note and vitals reviewed.    ED  Treatments / Results  Labs (all labs ordered are listed, but only abnormal results are displayed) Labs Reviewed  COMPREHENSIVE METABOLIC PANEL - Abnormal; Notable for the following components:      Result Value   Glucose, Bld 126 (*)    All other components within normal limits  CBC WITH DIFFERENTIAL/PLATELET - Abnormal; Notable for the following components:   RDW 15.9 (*)    All other components within normal limits  MAGNESIUM - Abnormal; Notable for the following components:   Magnesium 1.5 (*)    All other components within normal limits  LEVETIRACETAM LEVEL    EKG EKG Interpretation  Date/Time:  Thursday November 10 2017 11:36:05 EDT Ventricular Rate:  99 PR Interval:  170 QRS Duration: 74 QT Interval:  356 QTC Calculation: 456  R Axis:   65 Text Interpretation:  Normal sinus rhythm Cannot rule out Anterior infarct , age undetermined Abnormal ECG No significant change since last tracing Confirmed by Richardean Canal [phone]) on 11/10/2017 3:42:04 PM   Radiology No results found.  Procedures .Critical Care Performed by: Fayrene Helper, PA-C Authorized by: Fayrene Helper, PA-C   Critical care provider statement:    Critical care time (minutes):  30   Critical care was time spent personally by me on the following activities:  Discussions with consultants, evaluation of patient's response to treatment, examination of patient, ordering and performing treatments and interventions, ordering and review of laboratory studies, ordering and review of radiographic studies, pulse oximetry, re-evaluation of patient's condition, obtaining history from patient or surrogate and review of old charts   (including critical care time)  Medications Ordered in ED Medications  ondansetron (ZOFRAN) injection 4 mg (4 mg Intravenous Given 11/10/17 1546)  sodium chloride 0.9 % bolus 1,000 mL (0 mLs Intravenous Stopped 11/10/17 1918)  famotidine (PEPCID) IVPB 20 mg premix (0 mg Intravenous Stopped 11/10/17  1920)  levETIRAcetam (KEPPRA) IVPB 1000 mg/100 mL premix (0 mg Intravenous Stopped 11/10/17 1638)     Initial Impression / Assessment and Plan / ED Course  I have reviewed the triage vital signs and the nursing notes.  Pertinent labs & imaging results that were available during my care of the patient were reviewed by me and considered in my medical decision making (see chart for details).     BP (!) 132/96 (BP Location: Right Arm)   Pulse 90   Temp 98.7 F (37.1 C) (Oral)   Resp 19   LMP 12/17/2014   SpO2 100%    Final Clinical Impressions(s) / ED Diagnoses   Final diagnoses:  Seizure (HCC)  Hypomagnesemia    ED Discharge Orders    None     2:50 PM Patient with history of alcohol-related seizure who had 2 witnessed seizure episode by her husband earlier today.  She denies any significant injury from his seizure but did report urinary incontinence.  She admits she has not been taking her Keppra for the past 2 days because she did not like the way it feels.  She is currently at her baseline.  Will check labs, give Keppra loading dose and anticipate discharge if labs are reassuring.  4:03 PM Pt sign out to oncoming provider Arthor Captain, PA-C who will reassess pt and f/u on labs and dispo as appropriate.    Fayrene Helper, PA-C 11/10/17 1608    Donnetta Hutching, MD 11/11/17 1217  ADDENDUM: CC time added    Fayrene Helper, PA-C 11/21/17 1000    Donnetta Hutching, MD 11/21/17 [phone]

## 2017-11-10 NOTE — ED Triage Notes (Signed)
Pt has not taken seizure meds for past 2 days. Had seizure today. Pt brought her keppra- RN asked that she take her medicine. She states has not been taking bc when she takes magnesium it makes her nauseated.

## 2017-11-10 NOTE — ED Provider Notes (Signed)
Patient taken in sign out from PA [place].  Patient has a history of seizure disorder and alcohol abuse.  Patient states that over the past 2 days she stopped taking her Keppra because she does not like the way it makes her feel.  She had 2 witnessed seizures today by her husband was brought in for evaluation.  She denies fevers, chills.  Her magnesium was found to be low and chronic.  Patient will be discharged to follow-up with her primary care physician.  She has Keppra at home and will continue to take the medication.  She was given a loading dose here without repeat in her   Arthor Captain, Cordelia Poche 11/11/17 0045    Charlynne Pander, MD 11/11/17 [phone]

## 2017-11-11 ENCOUNTER — Encounter: Payer: Self-pay | Admitting: Internal Medicine

## 2017-11-11 ENCOUNTER — Ambulatory Visit: Payer: Self-pay | Attending: Internal Medicine | Admitting: Internal Medicine

## 2017-11-11 ENCOUNTER — Other Ambulatory Visit: Payer: Self-pay

## 2017-11-11 VITALS — BP 108/83 | HR 84 | Temp 98.5°F | Resp 12 | Wt 183.0 lb

## 2017-11-11 DIAGNOSIS — Z7982 Long term (current) use of aspirin: Secondary | ICD-10-CM | POA: Insufficient documentation

## 2017-11-11 DIAGNOSIS — Z7984 Long term (current) use of oral hypoglycemic drugs: Secondary | ICD-10-CM | POA: Insufficient documentation

## 2017-11-11 DIAGNOSIS — Z87891 Personal history of nicotine dependence: Secondary | ICD-10-CM | POA: Insufficient documentation

## 2017-11-11 DIAGNOSIS — M109 Gout, unspecified: Secondary | ICD-10-CM | POA: Insufficient documentation

## 2017-11-11 DIAGNOSIS — Z8249 Family history of ischemic heart disease and other diseases of the circulatory system: Secondary | ICD-10-CM | POA: Insufficient documentation

## 2017-11-11 DIAGNOSIS — F1010 Alcohol abuse, uncomplicated: Secondary | ICD-10-CM | POA: Insufficient documentation

## 2017-11-11 DIAGNOSIS — Z885 Allergy status to narcotic agent status: Secondary | ICD-10-CM | POA: Insufficient documentation

## 2017-11-11 DIAGNOSIS — F329 Major depressive disorder, single episode, unspecified: Secondary | ICD-10-CM | POA: Insufficient documentation

## 2017-11-11 DIAGNOSIS — F1490 Cocaine use, unspecified, uncomplicated: Secondary | ICD-10-CM | POA: Insufficient documentation

## 2017-11-11 DIAGNOSIS — E119 Type 2 diabetes mellitus without complications: Secondary | ICD-10-CM | POA: Insufficient documentation

## 2017-11-11 DIAGNOSIS — Z881 Allergy status to other antibiotic agents status: Secondary | ICD-10-CM | POA: Insufficient documentation

## 2017-11-11 DIAGNOSIS — E669 Obesity, unspecified: Secondary | ICD-10-CM | POA: Insufficient documentation

## 2017-11-11 DIAGNOSIS — Z23 Encounter for immunization: Secondary | ICD-10-CM | POA: Insufficient documentation

## 2017-11-11 DIAGNOSIS — Z79899 Other long term (current) drug therapy: Secondary | ICD-10-CM | POA: Insufficient documentation

## 2017-11-11 DIAGNOSIS — G40909 Epilepsy, unspecified, not intractable, without status epilepticus: Secondary | ICD-10-CM | POA: Insufficient documentation

## 2017-11-11 DIAGNOSIS — K219 Gastro-esophageal reflux disease without esophagitis: Secondary | ICD-10-CM | POA: Insufficient documentation

## 2017-11-11 DIAGNOSIS — I10 Essential (primary) hypertension: Secondary | ICD-10-CM | POA: Insufficient documentation

## 2017-11-11 LAB — LEVETIRACETAM LEVEL: Levetiracetam Lvl: 13.1 ug/mL (ref 10.0–40.0)

## 2017-11-11 MED ORDER — MAGNESIUM OXIDE 400 MG PO TABS: 400.0000 mg | ORAL_TABLET | Freq: Every day | ORAL | 0 refills | Status: DC

## 2017-11-11 MED FILL — MAGNESIUM OXIDE 400 MG TAB: 400 | 30 days supply | Qty: 30 | Fill #0

## 2017-11-11 MED FILL — AMLODIPINE BESYLATE 10 MG T: 10 | 30 days supply | Qty: 30 | Fill #3

## 2017-11-11 NOTE — Progress Notes (Signed)
HFU -knee pain Concerns with medications

## 2017-11-11 NOTE — Patient Instructions (Addendum)
Please keep your appointment with the neurologist next Friday. Please take your seizure medicine every day as prescribed. Start the magnesium supplement pills.  I have sent the prescription to the pharmacy for you.   Influenza Virus Vaccine injection (Fluarix) What is this medicine? INFLUENZA VIRUS VACCINE (in floo EN zuh VAHY ruhs vak SEEN) helps to reduce the risk of getting influenza also known as the flu. This medicine may be used for other purposes; ask your health care provider or pharmacist if you have questions. COMMON BRAND NAME(S): Fluarix, Fluzone What should I tell my health care provider before I take this medicine? They need to know if you have any of these conditions: -bleeding disorder like hemophilia -fever or infection -Guillain-Barre syndrome or other neurological problems -immune system problems -infection with the human immunodeficiency virus (HIV) or AIDS -low blood platelet counts -multiple sclerosis -an unusual or allergic reaction to influenza virus vaccine, eggs, chicken proteins, latex, gentamicin, other medicines, foods, dyes or preservatives -pregnant or trying to get pregnant -breast-feeding How should I use this medicine? This vaccine is for injection into a muscle. It is given by a health care professional. A copy of Vaccine Information Statements will be given before each vaccination. Read this sheet carefully each time. The sheet may change frequently. Talk to your pediatrician regarding the use of this medicine in children. Special care may be needed. Overdosage: If you think you have taken too much of this medicine contact a poison control center or emergency room at once. NOTE: This medicine is only for you. Do not share this medicine with others. What if I miss a dose? This does not apply. What may interact with this medicine? -chemotherapy or radiation therapy -medicines that lower your immune system like etanercept, anakinra, infliximab, and  adalimumab -medicines that treat or prevent blood clots like warfarin -phenytoin -steroid medicines like prednisone or cortisone -theophylline -vaccines This list may not describe all possible interactions. Give your health care provider a list of all the medicines, herbs, non-prescription drugs, or dietary supplements you use. Also tell them if you smoke, drink alcohol, or use illegal drugs. Some items may interact with your medicine. What should I watch for while using this medicine? Report any side effects that do not go away within 3 days to your doctor or health care professional. Call your health care provider if any unusual symptoms occur within 6 weeks of receiving this vaccine. You may still catch the flu, but the illness is not usually as bad. You cannot get the flu from the vaccine. The vaccine will not protect against colds or other illnesses that may cause fever. The vaccine is needed every year. What side effects may I notice from receiving this medicine? Side effects that you should report to your doctor or health care professional as soon as possible: -allergic reactions like skin rash, itching or hives, swelling of the face, lips, or tongue Side effects that usually do not require medical attention (report to your doctor or health care professional if they continue or are bothersome): -fever -headache -muscle aches and pains -pain, tenderness, redness, or swelling at site where injected -weak or tired This list may not describe all possible side effects. Call your doctor for medical advice about side effects. You may report side effects to FDA at 1-800-FDA-1088. Where should I keep my medicine? This vaccine is only given in a clinic, pharmacy, doctor's office, or other health care setting and will not be stored at home. NOTE: This sheet is  a summary. It may not cover all possible information. If you have questions about this medicine, talk to your doctor, pharmacist, or health  care provider.  2018 Elsevier/Gold Standard (2007-09-06 09:30:40)  Pneumococcal Polysaccharide Vaccine: What You Need to Know 1. Why get vaccinated? Vaccination can protect older adults (and some children and younger adults) from pneumococcal disease. Pneumococcal disease is caused by bacteria that can spread from person to person through close contact. It can cause ear infections, and it can also lead to more serious infections of the:  Lungs (pneumonia),  Blood (bacteremia), and  Covering of the brain and spinal cord (meningitis). Meningitis can cause deafness and brain damage, and it can be fatal.  Anyone can get pneumococcal disease, but children under 74 years of age, people with certain medical conditions, adults over 54 years of age, and cigarette smokers are at the highest risk. About 18,000 older adults die each year from pneumococcal disease in the [place]. Treatment of pneumococcal infections with penicillin and other drugs used to be more effective. But some strains of the disease have become resistant to these drugs. This makes prevention of the disease, through vaccination, even more important. 2. Pneumococcal polysaccharide vaccine (PPSV23) Pneumococcal polysaccharide vaccine (PPSV23) protects against 23 types of pneumococcal bacteria. It will not prevent all pneumococcal disease. PPSV23 is recommended for:  All adults 24 years of age and older,  Anyone 2 through 52 years of age with certain long-term health problems,  Anyone 2 through 52 years of age with a weakened immune system,  Adults 96 through 52 years of age who smoke cigarettes or have asthma.  Most people need only one dose of PPSV. A second dose is recommended for certain high-risk groups. People 32 and older should get a dose even if they have gotten one or more doses of the vaccine before they turned 65. Your healthcare provider can give you more information about these recommendations. Most healthy  adults develop protection within 2 to 3 weeks of getting the shot. 3. Some people should not get this vaccine  Anyone who has had a life-threatening allergic reaction to PPSV should not get another dose.  Anyone who has a severe allergy to any component of PPSV should not receive it. Tell your provider if you have any severe allergies.  Anyone who is moderately or severely ill when the shot is scheduled may be asked to wait until they recover before getting the vaccine. Someone with a mild illness can usually be vaccinated.  Children less than 81 years of age should not receive this vaccine.  There is no evidence that PPSV is harmful to either a pregnant woman or to her fetus. However, as a precaution, women who need the vaccine should be vaccinated before becoming pregnant, if possible. 4. Risks of a vaccine reaction With any medicine, including vaccines, there is a chance of side effects. These are usually mild and go away on their own, but serious reactions are also possible. About half of people who get PPSV have mild side effects, such as redness or pain where the shot is given, which go away within about two days. Less than 1 out of 100 people develop a fever, muscle aches, or more severe local reactions. Problems that could happen after any vaccine:  People sometimes faint after a medical procedure, including vaccination. Sitting or lying down for about 15 minutes can help prevent fainting, and injuries caused by a fall. Tell your doctor if you feel dizzy, or have  vision changes or ringing in the ears.  Some people get severe pain in the shoulder and have difficulty moving the arm where a shot was given. This happens very rarely.  Any medication can cause a severe allergic reaction. Such reactions from a vaccine are very rare, estimated at about 1 in a million doses, and would happen within a few minutes to a few hours after the vaccination. As with any medicine, there is a very remote  chance of a vaccine causing a serious injury or death. The safety of vaccines is always being monitored. For more information, visit: http://floyd.org/ 5. What if there is a serious reaction? What should I look for? Look for anything that concerns you, such as signs of a severe allergic reaction, very high fever, or unusual behavior. Signs of a severe allergic reaction can include hives, swelling of the face and throat, difficulty breathing, a fast heartbeat, dizziness, and weakness. These would usually start a few minutes to a few hours after the vaccination. What should I do? If you think it is a severe allergic reaction or other emergency that can't wait, call 9-1-1 or get to the nearest hospital. Otherwise, call your doctor. Afterward, the reaction should be reported to the Vaccine Adverse Event Reporting System (VAERS). Your doctor might file this report, or you can do it yourself through the VAERS web site at www.vaers.LAgents.no, or by calling 1-604-672-1735. VAERS does not give medical advice. 6. How can I learn more?  Ask your doctor. He or she can give you the vaccine package insert or suggest other sources of information.  Call your local or state health department.  Contact the Centers for Disease Control and Prevention (CDC): ? Call (450)266-1444 (1-800-CDC-INFO) or ? Visit CDC's website at PicCapture.uy CDC Pneumococcal Polysaccharide Vaccine VIS (06/15/13) This information is not intended to replace advice given to you by your health care provider. Make sure you discuss any questions you have with your health care provider. Document Released: 12/06/2005 Document Revised: 10/30/2015 Document Reviewed: 10/30/2015 Elsevier Interactive Patient Education  2017 [employer].

## 2017-11-11 NOTE — Progress Notes (Signed)
Patient ID: Christina Byrd, female    DOB: [birth year]  MRN: [id number]  CC: ER follow-up  Subjective: Christina Byrd is a 52 y.o. female who presents for ER f/u.  Her significant other is with her. Her concerns today include:  Patient with history of HTN, GERD, diabetes type 2, gout, ETOH abuse with withdrawal sz,   Patient seen in the ED yesterday for seizure episodes.  Keppra level was subtherapeutic.  Patient reports that she had stopped taking the medication because when she takes all of her medicines together in the mornings she feels sick.  She was given a loading dose of Keppra in the ER and advised to take the medicine as prescribed.  Magnesium level was low.  She is taking an over-the-counter magnesium supplement.  She has an appointment coming up with neurology next Friday. Drinks two 12 oz beers and 2 shots every evening.  Reports that this is less than what she was drinking before.  She does not feel that she has a problem with alcohol and is not interested in any rehab programs.  She feels that she can stop on her own when she is ready.   Patient Active Problem List   Diagnosis Date Noted  . New onset type 2 diabetes mellitus (HCC) 09/13/2016  . Traumatic rhabdomyolysis (HCC) 07/23/2016  . Depression 07/08/2016  . Cocaine use   . Hot flash, menopausal 06/22/2016  . Intertrigo 06/22/2016  . Gout of left ankle 10/09/2015  . Left hip pain 09/02/2015  . EtOH dependence (HCC) 09/02/2015  . Colonoscopy refused 09/02/2015  . Hyperuricemia 05/27/2015  . 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 Pain and swelling of left ankle 12/02/2014  . Tendinitis of right hip flexor 07/23/2014  . Allergic rhinitis 05/13/2014  . GERD (gastroesophageal reflux disease) 04/01/2014  . Tendonitis, Achilles, right 04/01/2014  . Osteoarthritis of left knee 12/11/2013  . Ex-smoker 11/09/2008  . OBESITY 08/12/2006  . HYPERTENSION, BENIGN ESSENTIAL 08/12/2006     Current  Outpatient Medications on File Prior to Visit  Medication Sig Dispense Refill  . amLODipine (NORVASC) 10 MG tablet Take 1 tablet (10 mg total) by mouth daily. 90 tablet 1  . aspirin EC 81 MG tablet Take 1 tablet (81 mg total) by mouth daily.    . cloNIDine (CATAPRES) 0.1 MG tablet Take 1 tablet (0.1 mg total) by mouth at bedtime as needed. For hot flashes 30 tablet 3  . indomethacin (INDOCIN) 50 MG capsule Take 1 capsule (50 mg total) by mouth 3 (three) times daily with meals. Prn pain with gout 30 capsule 1  . levETIRAcetam (KEPPRA) 500 MG tablet Take 1 tablet (500 mg total) by mouth 2 (two) times daily. 60 tablet 2  . magnesium chloride (SLOW-MAG) 64 MG TBEC SR tablet Take 2 tablets (128 mg total) by mouth daily. 60 tablet 0  . metFORMIN (GLUCOPHAGE) 500 MG tablet Take 1 tablet (500 mg total) by mouth 2 (two) times daily with a meal. 60 tablet 6  . pantoprazole (PROTONIX) 20 MG tablet TAKE 1 TABLET BY MOUTH 2 TIMES DAILY BEFORE A MEAL (Patient taking differently: Take 20 mg by mouth 2 (two) times daily before a meal. TAKE 1 TABLET BY MOUTH 2 TIMES DAILY BEFORE A MEAL) 60 tablet 2  . potassium chloride SA (K-DUR,KLOR-CON) 20 MEQ tablet Take 1 tablet (20 mEq total) by mouth 2 (two) times daily. (Patient not taking: Reported on 11/10/2017) 20 tablet 0   No current facility-administered  medications on file prior to visit.     Allergies  Allergen Reactions  . Losartan Potassium     Rash  . Levaquin [Levofloxacin In D5w] Rash  . Ace Inhibitors Cough    REACTION: cough  . Codeine Nausea Only  . Cefepime Rash    09/04/12 pm Patient started to break out with small macules after IV Vanco infusion, then macules increased in size after starting cefepime infusion.  . Vancomycin Rash    09/04/12 pm Patient started to break out with small macules after IV Vanco infusion, then macules increased in size after starting cefepime infusion.    Social History   Socioeconomic History  . Marital status:  Single    Spouse name: Not on file  . Number of children: Not on file  . Years of education: Not on file  . Highest education level: Not on file  Occupational History  . Not on file  Social Needs  . Financial resource strain: Not on file  . Food insecurity:    Worry: Not on file    Inability: Not on file  . Transportation needs:    Medical: Not on file    Non-medical: Not on file  Tobacco Use  . Smoking status: Former Smoker    Last attempt to quit: 06/25/2012    Years since quitting: 5.3  . Smokeless tobacco: Never Used  Substance and Sexual Activity  . Alcohol use: Yes    Alcohol/week: 3.0 standard drinks    Types: 3 Standard drinks or equivalent per week    Comment: ADMITS TO DRINKING 3-4 BEERS/DAY  . Drug use: No  . Sexual activity: Not on file  Lifestyle  . Physical activity:    Days per week: Not on file    Minutes per session: Not on file  . Stress: Not on file  Relationships  . Social connections:    Talks on phone: Not on file    Gets together: Not on file    Attends religious service: Not on file    Active member of club or organization: Not on file    Attends meetings of clubs or organizations: Not on file    Relationship status: Not on file  . Intimate partner violence:    Fear of current or ex partner: Not on file    Emotionally abused: Not on file    Physically abused: Not on file    Forced sexual activity: Not on file  Other Topics Concern  . Not on file  Social History Narrative   She is married to a female partner   Has 1 son   Has 5 grandchildren- 4 granddaughter and 1 grandson   Her mother is living, 40 yo. Mother lives with her sister who is a CNA but does not take care of their mother     Family History  Problem Relation Age of Onset  . CAD Mother     No past surgical history on file.  ROS: Review of Systems Negative except as above. PHYSICAL EXAM: BP 108/83 (BP Location: Right Arm, Patient Position: Sitting, Cuff Size: Large)   Pulse  84   Temp 98.5 F (36.9 C) (Oral)   Resp 12   Wt 183 lb (83 kg)   LMP 12/17/2014   SpO2 98%   BMI 31.41 kg/m   Physical Exam  General appearance - alert, well appearing, and in no distress Mental status -patient very talkative. Neck - supple, no significant adenopathy Chest - clear to  auscultation, no wheezes, rales or rhonchi, symmetric air entry Heart - normal rate, regular rhythm, normal S1, S2, no murmurs, rubs, clicks or gallops Extremities - peripheral pulses normal, no pedal edema, no clubbing or cyanosis Neuro: No asterixis observed. Results for orders placed or performed during the hospital encounter of 11/10/17  Comprehensive metabolic panel  Result Value Ref Range   Sodium 140 135 - 145 mmol/L   Potassium 4.0 3.5 - 5.1 mmol/L   Chloride 104 98 - 111 mmol/L   CO2 23 22 - 32 mmol/L   Glucose, Bld 126 (H) 70 - 99 mg/dL   BUN 15 6 - 20 mg/dL   Creatinine, Ser 9.60 0.44 - 1.00 mg/dL   Calcium 9.0 8.9 - 45.4 mg/dL   Total Protein 7.4 6.5 - 8.1 g/dL   Albumin 3.9 3.5 - 5.0 g/dL   AST 20 15 - 41 U/L   ALT 16 0 - 44 U/L   Alkaline Phosphatase 86 38 - 126 U/L   Total Bilirubin 0.8 0.3 - 1.2 mg/dL   GFR calc non Af Amer >60 >60 mL/min   GFR calc Af Amer >60 >60 mL/min   Anion gap 13 5 - 15  CBC with Differential  Result Value Ref Range   WBC 8.7 4.0 - 10.5 K/uL   RBC 4.36 3.87 - 5.11 MIL/uL   Hemoglobin 12.8 12.0 - 15.0 g/dL   HCT 09.8 11.9 - 14.7 %   MCV 90.8 78.0 - 100.0 fL   MCH 29.4 26.0 - 34.0 pg   MCHC 32.3 30.0 - 36.0 g/dL   RDW 82.9 (H) 56.2 - 13.0 %   Platelets 197 150 - 400 K/uL   Neutrophils Relative % 78 %   Neutro Abs 6.8 1.7 - 7.7 K/uL   Lymphocytes Relative 14 %   Lymphs Abs 1.2 0.7 - 4.0 K/uL   Monocytes Relative 6 %   Monocytes Absolute 0.5 0.1 - 1.0 K/uL   Eosinophils Relative 1 %   Eosinophils Absolute 0.1 0.0 - 0.7 K/uL   Basophils Relative 1 %   Basophils Absolute 0.1 0.0 - 0.1 K/uL   Immature Granulocytes 0 %   Abs Immature  Granulocytes 0.0 0.0 - 0.1 K/uL  Magnesium  Result Value Ref Range   Magnesium 1.5 (L) 1.7 - 2.4 mg/dL     ASSESSMENT AND PLAN: 1. Seizure disorder [facility]) Encourage patient to take the Keppra every day and to keep appointment with neurology next Friday.  If she is not tolerating the Keppra, neurology can advise on change to a different medication.  2. Alcohol abuse Encouraged to quit and to get into a treatment program.  She is not interested in doing so at this time.  Advised that alcohol can lower seizure threshold especially when withdrawing.  3. Hypomagnesemia The over-the-counter supplement that she is taking has only 100 mg of magnesium in it.  I advised her to discontinue that one and I have given a prescription for magnesium oxide. - magnesium oxide (MAG-OX) 400 MG tablet; Take 1 tablet (400 mg total) by mouth daily.  Dispense: 90 tablet; Refill: 0  4. Need for immunization against influenza - Flu Vaccine QUAD 36+ mos IM  Patient was given the opportunity to ask questions.  Patient verbalized understanding of the plan and was able to repeat key elements of the plan.   Orders Placed This Encounter  Procedures  . Flu Vaccine QUAD 36+ mos IM     Requested Prescriptions   Signed Prescriptions  Disp Refills  . magnesium oxide (MAG-OX) 400 MG tablet 90 tablet 0    Sig: Take 1 tablet (400 mg total) by mouth daily.    Return if symptoms worsen or fail to improve.  Jonah Blue, MD, FACP

## 2017-11-18 ENCOUNTER — Encounter: Payer: Self-pay | Admitting: Neurology

## 2017-11-18 ENCOUNTER — Ambulatory Visit (INDEPENDENT_AMBULATORY_CARE_PROVIDER_SITE_OTHER): Payer: Self-pay | Admitting: Neurology

## 2017-11-18 ENCOUNTER — Other Ambulatory Visit: Payer: Self-pay

## 2017-11-18 VITALS — BP 120/84 | HR 107 | Ht 62.0 in | Wt 184.0 lb

## 2017-11-18 DIAGNOSIS — R569 Unspecified convulsions: Secondary | ICD-10-CM

## 2017-11-18 MED ORDER — LEVETIRACETAM 500 MG PO TABS: 500.0000 mg | ORAL_TABLET | Freq: Two times a day (BID) | ORAL | 3 refills | Status: DC

## 2017-11-18 NOTE — Patient Instructions (Signed)
1. Continue Keppra 500mg  twice a day 2. Continue with plan to start reducing and ultimately stopping alcohol intake 3. Follow-up in 6 months, call for any changes  Seizure Precautions: 1. If medication has been prescribed for you to prevent seizures, take it exactly as directed.  Do not stop taking the medicine without talking to your doctor first, even if you have not had a seizure in a long time.   2. Avoid activities in which a seizure would cause danger to yourself or to others.  Don't operate dangerous machinery, swim alone, or climb in high or dangerous places, such as on ladders, roofs, or girders.  Do not drive unless your doctor says you may.  3. If you have any warning that you may have a seizure, lay down in a safe place where you can't hurt yourself.    4.  No driving for 6 months from last seizure, as per [facility].   Please refer to the following link on the [employer]'s website for more information: http://www.epilepsyfoundation.org/answerplace/Social/driving/drivingu.cfm   5.  Maintain good sleep hygiene. Avoid alcohol.  6.  Contact your doctor if you have any problems that may be related to the medicine you are taking.  7.  Call 911 and bring the patient back to the ED if:        A.  The seizure lasts longer than 5 minutes.       B.  The patient doesn't awaken shortly after the seizure  C.  The patient has new problems such as difficulty seeing, speaking or moving  D.  The patient was injured during the seizure  E.  The patient has a temperature over 102 F (39C)  F.  The patient vomited and now is having trouble breathing

## 2017-11-18 NOTE — Progress Notes (Signed)
NEUROLOGY CONSULTATION NOTE  EZELLA Byrd MRN: [id number] DOB: [birth year]  Referring provider: Georgian Co, PA-C Primary care provider: Dr. Hoy Byrd  Reason for consult:  seizures  Thank you for your kind referral of Christina Byrd for consultation of the above symptoms. Although her history is well known to you, please allow me to reiterate it for the purpose of our medical record. The patient was accompanied to the clinic by her significant other who also provides collateral information. Records and images were personally reviewed where available.  HISTORY OF PRESENT ILLNESS: This is a 52 year old right-handed woman with a history of alcohol abuse, diabetes, hypertension, presenting for evaluation of recurrent seizures. The first seizure occurred in May 2018, she had a seizure in her sleep, then had another one before EMS arrived. Her significant other states she was in a daze and not responding in between seizures, then had another en route to the hospital. Per ER notes, she had 4 seizures that day. No focal weakness. Her CK was elevated, UDS positive for cocaine. 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 EEG normal. 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 She was seizure-free for almost a year until July 2019 when she had another convulsion. She denies any prior warning symptoms, waking up in the ambulance. At that time, her potassium and magnesium levels were very low. UDS negative. She was discharged home on Keppra but had not been taking it because she would have nausea and vomiting taking it together with her other medications. She had a third seizure on 11/10/2017, her significant other reports that this also occurred in sleep, she had heavy breathing with urinary incontinence. She would be confused after, no focal weakness. She was discharged home on Keppra 500mg  BID which she is tolerating  without side effects now that she is taking medications separately. She has occasional episodes where she smells an odd/terrible smell like blood or trash. Her significant other denies any staring/unresponsive episodes except in between the seizures. She denies any gaps in time, rising epigastric sensation, focal numbness/tingling/weakness, deja vu sensations, or myoclonic jerks. She has a history of heavy alcohol use, she usually drinks 2 beers and 4 shots of liquor every night, at most 3 12-oz beers and 6 shots of liquor. She recalls that the night prior to the seizures she had more alcohol and was up until 3am. She denies any headaches, dizziness, vision changes, neck/back pain, bowel/bladder dysfunction except for occasional constipation. Her father had seizures due to alcohol. Otherwise she had a normal birth and early development.  There is no history of febrile convulsions, CNS infections such as meningitis/encephalitis, significant traumatic brain injury, neurosurgical procedures.   PAST MEDICAL HISTORY: Past Medical History:  Diagnosis Date  . GASTROESOPHAGEAL REFLUX, NO ESOPHAGITIS 04/21/2006   Qualifier: Diagnosis of  By: Levada Schilling    . GERD (gastroesophageal reflux disease) Dx 1995  . HTN (hypertension)     PAST SURGICAL HISTORY: No past surgical history on file.  MEDICATIONS: Current Outpatient Medications on File Prior to Visit  Medication Sig Dispense Refill  . amLODipine (NORVASC) 10 MG tablet Take 1 tablet (10 mg total) by mouth daily. 90 tablet 1  . aspirin EC 81 MG tablet Take 1 tablet (81 mg total) by mouth daily.    . cloNIDine (CATAPRES) 0.1 MG tablet Take 1 tablet (0.1 mg total) by mouth at bedtime as needed.  For hot flashes 30 tablet 3  . indomethacin (INDOCIN) 50 MG capsule Take 1 capsule (50 mg total) by mouth 3 (three) times daily with meals. Prn pain with gout 30 capsule 1  . levETIRAcetam (KEPPRA) 500 MG tablet Take 1 tablet (500 mg total) by mouth 2 (two) times  daily. 60 tablet 2  . magnesium chloride (SLOW-MAG) 64 MG TBEC SR tablet Take 2 tablets (128 mg total) by mouth daily. 60 tablet 0  . magnesium oxide (MAG-OX) 400 MG tablet Take 1 tablet (400 mg total) by mouth daily. 90 tablet 0  . metFORMIN (GLUCOPHAGE) 500 MG tablet Take 1 tablet (500 mg total) by mouth 2 (two) times daily with a meal. 60 tablet 6  . pantoprazole (PROTONIX) 20 MG tablet TAKE 1 TABLET BY MOUTH 2 TIMES DAILY BEFORE A MEAL (Patient taking differently: Take 20 mg by mouth 2 (two) times daily before a meal. TAKE 1 TABLET BY MOUTH 2 TIMES DAILY BEFORE A MEAL) 60 tablet 2  . potassium chloride SA (K-DUR,KLOR-CON) 20 MEQ tablet Take 1 tablet (20 mEq total) by mouth 2 (two) times daily. (Patient not taking: Reported on 11/10/2017) 20 tablet 0   No current facility-administered medications on file prior to visit.     ALLERGIES: Allergies  Allergen Reactions  . Losartan Potassium     Rash  . Levaquin [Levofloxacin In D5w] Rash  . Ace Inhibitors Cough    REACTION: cough  . Codeine Nausea Only  . Cefepime Rash    09/04/12 pm Patient started to break out with small macules after IV Vanco infusion, then macules increased in size after starting cefepime infusion.  . Vancomycin Rash    09/04/12 pm Patient started to break out with small macules after IV Vanco infusion, then macules increased in size after starting cefepime infusion.    FAMILY HISTORY: Family History  Problem Relation Age of Onset  . CAD Mother     SOCIAL HISTORY: Social History   Socioeconomic History  . Marital status: Single    Spouse name: Not on file  . Number of children: Not on file  . Years of education: Not on file  . Highest education level: Not on file  Occupational History  . Not on file  Social Needs  . Financial resource strain: Not on file  . Food insecurity:    Worry: Not on file    Inability: Not on file  . Transportation needs:    Medical: Not on file    Non-medical: Not on file    Tobacco Use  . Smoking status: Former Smoker    Last attempt to quit: 06/25/2012    Years since quitting: 5.4  . Smokeless tobacco: Never Used  Substance and Sexual Activity  . Alcohol use: Yes    Alcohol/week: 3.0 standard drinks    Types: 3 Standard drinks or equivalent per week    Comment: ADMITS TO DRINKING 3-4 BEERS/DAY  . Drug use: No  . Sexual activity: Not on file  Lifestyle  . Physical activity:    Days per week: Not on file    Minutes per session: Not on file  . Stress: Not on file  Relationships  . Social connections:    Talks on phone: Not on file    Gets together: Not on file    Attends religious service: Not on file    Active member of club or organization: Not on file    Attends meetings of clubs or organizations: Not on file  Relationship status: Not on file  . Intimate partner violence:    Fear of current or ex partner: Not on file    Emotionally abused: Not on file    Physically abused: Not on file    Forced sexual activity: Not on file  Other Topics Concern  . Not on file  Social History Narrative   She is married to a female partner   Has 1 son   Has 5 grandchildren- 4 granddaughter and 1 grandson   Her mother is living, 59 yo. Mother lives with her sister who is a CNA but does not take care of their mother     REVIEW OF SYSTEMS: Constitutional: No fevers, chills, or sweats, no generalized fatigue, change in appetite Eyes: No visual changes, double vision, eye pain Ear, nose and throat: No hearing loss, ear pain, nasal congestion, sore throat Cardiovascular: No chest pain, palpitations Respiratory:  No shortness of breath at rest or with exertion, wheezes GastrointestinaI: No nausea, vomiting, diarrhea, abdominal pain, fecal incontinence Genitourinary:  No dysuria, urinary retention or frequency Musculoskeletal:  No neck pain, back pain Integumentary: No rash, pruritus, skin lesions Neurological: as above Psychiatric: No depression, insomnia,  anxiety Endocrine: No palpitations, fatigue, diaphoresis, mood swings, change in appetite, change in weight, increased thirst Hematologic/Lymphatic:  No anemia, purpura, petechiae. Allergic/Immunologic: no itchy/runny eyes, nasal congestion, recent allergic reactions, rashes  PHYSICAL EXAM: Vitals:   11/18/17 1413  BP: 120/84  Pulse: (!) 107  SpO2: 98%   General: No acute distress Head:  Normocephalic/atraumatic Eyes: Fundoscopic exam shows bilateral sharp discs, no vessel changes, exudates, or hemorrhages Neck: supple, no paraspinal tenderness, full range of motion Back: No paraspinal tenderness Heart: regular rate and rhythm Lungs: Clear to auscultation bilaterally. Vascular: No carotid bruits. Skin/Extremities: No rash, no edema Neurological Exam: Mental status: alert and oriented to person, place, and time, no dysarthria or aphasia, Fund of knowledge is appropriate.  Recent and remote memory are intact.  Attention and concentration are normal.    Able to name objects and repeat phrases. Cranial nerves: CN I: not tested CN II: pupils equal, round and reactive to light, visual fields intact, fundi unremarkable. CN III, IV, VI:  full range of motion, no nystagmus, no ptosis CN V: facial sensation intact CN VII: upper and lower face symmetric CN VIII: hearing intact to finger rub CN IX, X: gag intact, uvula midline CN XI: sternocleidomastoid and trapezius muscles intact CN XII: tongue midline Bulk & Tone: normal, no fasciculations. Motor: 5/5 throughout with no pronator drift. Sensation: intact to light touch, cold, pin, vibration and joint position sense.  No extinction to double simultaneous stimulation.  Romberg test negative Deep Tendon Reflexes: +2 throughout, no ankle clonus Plantar responses: downgoing bilaterally Cerebellar: no incoordination on finger to nose, heel to shin. No dysdiadochokinesia Gait: narrow-based and steady, able to tandem walk adequately. Tremor:  none  IMPRESSION: This is a 52 year old right-handed woman with a history of alcohol abuse, diabetes, hypertension, presenting for evaluation of recurrent seizures. She has a history of alcohol abuse and reports increased alcohol intake prior to the seizures. The last seizure occurred on 11/10/17. We discussed MRI brain findings of an old left frontal cortical stroke which could potentially act as a seizure focus, EEG normal. Seizures do not appear to be related to alcohol withdrawal, but have occur with increased alcohol intake so we discussed the importance of weaning and eventual alcohol cessation. Agree with continuation of Keppra 500mg  BID, refills sent. [place]  driving laws were discussed with the patient, and she knows to stop driving after a seizure, until 6 months seizure-free. She will follow-up in 6 months and knows to call for any changes.   Thank you for allowing me to participate in the care of this patient. Please do not hesitate to call for any questions or concerns.   Patrcia Dolly, M.D.  CC: Christina Co, PA-C, Dr. Alvis Lemmings

## 2017-12-14 ENCOUNTER — Encounter: Payer: Self-pay | Admitting: Family Medicine

## 2017-12-14 ENCOUNTER — Ambulatory Visit: Payer: Self-pay | Attending: Family Medicine | Admitting: Family Medicine

## 2017-12-14 VITALS — BP 122/84 | HR 81 | Temp 98.1°F | Ht 62.0 in | Wt 186.0 lb

## 2017-12-14 DIAGNOSIS — Z7984 Long term (current) use of oral hypoglycemic drugs: Secondary | ICD-10-CM | POA: Insufficient documentation

## 2017-12-14 DIAGNOSIS — R569 Unspecified convulsions: Secondary | ICD-10-CM | POA: Insufficient documentation

## 2017-12-14 DIAGNOSIS — E119 Type 2 diabetes mellitus without complications: Secondary | ICD-10-CM

## 2017-12-14 DIAGNOSIS — I10 Essential (primary) hypertension: Secondary | ICD-10-CM

## 2017-12-14 DIAGNOSIS — Z8673 Personal history of transient ischemic attack (TIA), and cerebral infarction without residual deficits: Secondary | ICD-10-CM | POA: Insufficient documentation

## 2017-12-14 DIAGNOSIS — Z881 Allergy status to other antibiotic agents status: Secondary | ICD-10-CM | POA: Insufficient documentation

## 2017-12-14 DIAGNOSIS — K219 Gastro-esophageal reflux disease without esophagitis: Secondary | ICD-10-CM

## 2017-12-14 DIAGNOSIS — Z1239 Encounter for other screening for malignant neoplasm of breast: Secondary | ICD-10-CM

## 2017-12-14 DIAGNOSIS — Z7982 Long term (current) use of aspirin: Secondary | ICD-10-CM | POA: Insufficient documentation

## 2017-12-14 DIAGNOSIS — R232 Flushing: Secondary | ICD-10-CM

## 2017-12-14 DIAGNOSIS — N951 Menopausal and female climacteric states: Secondary | ICD-10-CM | POA: Insufficient documentation

## 2017-12-14 DIAGNOSIS — Z885 Allergy status to narcotic agent status: Secondary | ICD-10-CM | POA: Insufficient documentation

## 2017-12-14 DIAGNOSIS — Z79899 Other long term (current) drug therapy: Secondary | ICD-10-CM | POA: Insufficient documentation

## 2017-12-14 LAB — POCT GLYCOSYLATED HEMOGLOBIN (HGB A1C): HBA1C, POC (CONTROLLED DIABETIC RANGE): 6.1 % (ref 0.0–7.0)

## 2017-12-14 LAB — GLUCOSE, POCT (MANUAL RESULT ENTRY): POC GLUCOSE: 129 mg/dL — AB (ref 70–99)

## 2017-12-14 MED ORDER — METFORMIN HCL 500 MG PO TABS: 500.0000 mg | ORAL_TABLET | Freq: Every day | ORAL | 1 refills | Status: DC

## 2017-12-14 MED ORDER — AMLODIPINE BESYLATE 10 MG PO TABS: 10.0000 mg | ORAL_TABLET | Freq: Every day | ORAL | 1 refills | Status: DC

## 2017-12-14 MED ORDER — MELOXICAM 7.5 MG PO TABS: 7.5000 mg | ORAL_TABLET | Freq: Every day | ORAL | 1 refills | Status: DC

## 2017-12-14 MED ORDER — CLONIDINE HCL 0.1 MG PO TABS: 0.1000 mg | ORAL_TABLET | Freq: Every evening | ORAL | 3 refills | Status: DC | PRN

## 2017-12-14 MED ORDER — ATORVASTATIN CALCIUM 20 MG PO TABS: 20.0000 mg | ORAL_TABLET | Freq: Every day | ORAL | 3 refills | Status: DC

## 2017-12-14 MED FILL — ?CLONIDINE HCL 0.1 MG TABL: 0.1 | 30 days supply | Qty: 30 | Fill #3

## 2017-12-14 MED FILL — PANTOPRAZOLE SOD DR 20 MG T: 20 | 30 days supply | Qty: 60 | Fill #1

## 2017-12-14 MED FILL — MAGNESIUM OXIDE 400 MG TAB: 400 | 30 days supply | Qty: 30 | Fill #1

## 2017-12-14 MED FILL — AMLODIPINE BESYLATE 10 MG T: 10 | 30 days supply | Qty: 30 | Fill #4

## 2017-12-14 MED FILL — ?METFORMIN HCL 500MG TABS: 500 | 30 days supply | Qty: 60 | Fill #1

## 2017-12-14 NOTE — Progress Notes (Signed)
Subjective:  Patient ID: Christina Byrd, female    DOB: December 17, 1965  Age: 52 y.o. MRN: [id number]  CC: Diabetes   HPI Christina Byrd is a 52 year old with a history of type 2 diabetes mellitus (A1c 6.1), pretension, alcohol abuse, new onset seizures who presents today for follow-up visit.  She has been taking metformin once daily rather than twice daily as prescribed and denies hypoglycemic episodes, neuropathy, visual concerns. She had an ED visit last month for seizures and was commenced on Keppra.  Followed up with neurology at which time seizures were thought not to be related to alcohol withdrawal but possibly from an old left frontal cortical stroke which could act as a seizure focus.  Her EEG was normal. She remains compliant with Keppra.  She drinks two 12 ounce bottles of beer and 6 shots a day and is not interested in quitting alcohol at this time. Denies additional concerns at this time.  Past Medical History:  Diagnosis Date  . GASTROESOPHAGEAL REFLUX, NO ESOPHAGITIS 04/21/2006   Qualifier: Diagnosis of  By: Levada Schilling    . GERD (gastroesophageal reflux disease) Dx 1995  . HTN (hypertension)     History reviewed. No pertinent surgical history.  Allergies  Allergen Reactions  . Losartan Potassium     Rash  . Levaquin [Levofloxacin In D5w] Rash  . Ace Inhibitors Cough    REACTION: cough  . Codeine Nausea Only  . Cefepime Rash    09/04/12 pm Patient started to break out with small macules after IV Vanco infusion, then macules increased in size after starting cefepime infusion.  . Vancomycin Rash    09/04/12 pm Patient started to break out with small macules after IV Vanco infusion, then macules increased in size after starting cefepime infusion.     Outpatient Medications Prior to Visit  Medication Sig Dispense Refill  . aspirin EC 81 MG tablet Take 1 tablet (81 mg total) by mouth daily.    . indomethacin (INDOCIN) 50 MG capsule Take 1 capsule (50 mg total)  by mouth 3 (three) times daily with meals. Prn pain with gout 30 capsule 1  . levETIRAcetam (KEPPRA) 500 MG tablet Take 1 tablet (500 mg total) by mouth 2 (two) times daily. 180 tablet 3  . magnesium oxide (MAG-OX) 400 MG tablet Take 1 tablet (400 mg total) by mouth daily. 90 tablet 0  . pantoprazole (PROTONIX) 20 MG tablet TAKE 1 TABLET BY MOUTH 2 TIMES DAILY BEFORE A MEAL (Patient taking differently: Take 20 mg by mouth 2 (two) times daily before a meal. TAKE 1 TABLET BY MOUTH 2 TIMES DAILY BEFORE A MEAL) 60 tablet 2  . amLODipine (NORVASC) 10 MG tablet Take 1 tablet (10 mg total) by mouth daily. 90 tablet 1  . cloNIDine (CATAPRES) 0.1 MG tablet Take 1 tablet (0.1 mg total) by mouth at bedtime as needed. For hot flashes 30 tablet 3  . metFORMIN (GLUCOPHAGE) 500 MG tablet Take 1 tablet (500 mg total) by mouth 2 (two) times daily with a meal. 60 tablet 6  . potassium chloride SA (K-DUR,KLOR-CON) 20 MEQ tablet Take 1 tablet (20 mEq total) by mouth 2 (two) times daily. (Patient not taking: Reported on 12/14/2017) 20 tablet 0   No facility-administered medications prior to visit.     ROS Review of Systems  Constitutional: Negative for activity change, appetite change and fatigue.  HENT: Negative for congestion, sinus pressure and sore throat.   Eyes: Negative for visual disturbance.  Respiratory:  Negative for cough, chest tightness, shortness of breath and wheezing.   Cardiovascular: Negative for chest pain and palpitations.  Gastrointestinal: Negative for abdominal distention, abdominal pain and constipation.  Endocrine: Negative for polydipsia.  Genitourinary: Negative for dysuria and frequency.  Musculoskeletal: Negative for arthralgias and back pain.  Skin: Negative for rash.  Neurological: Negative for tremors, light-headedness and numbness.  Hematological: Does not bruise/bleed easily.  Psychiatric/Behavioral: Negative for agitation and behavioral problems.    Objective:  BP 122/84    Pulse 81   Temp 98.1 F (36.7 C) (Oral)   Ht 5\' 2"  (1.575 m)   Wt 186 lb (84.4 kg)   LMP 12/17/2014   SpO2 99%   BMI 34.02 kg/m   BP/Weight 12/14/2017 11/18/2017 11/11/2017  Systolic BP 122 120 108  Diastolic BP 84 84 83  Wt. (Lbs) 186 184 183  BMI 34.02 33.65 31.41      Physical Exam  Constitutional: She is oriented to person, place, and time. She appears well-developed and well-nourished.  HENT:  Right Ear: External ear normal.  Left Ear: External ear normal.  Mouth/Throat: Oropharynx is clear and moist.  Cardiovascular: Normal rate, normal heart sounds and intact distal pulses.  No murmur heard. Pulmonary/Chest: Effort normal and breath sounds normal. She has no wheezes. She has no rales. She exhibits no tenderness.  Abdominal: Soft. Bowel sounds are normal. She exhibits no distension and no mass. There is no tenderness.  Musculoskeletal: Normal range of motion.  Neurological: She is alert and oriented to person, place, and time.  Skin: Skin is warm and dry.  Psychiatric: She has a normal mood and affect.    CMP Latest Ref Rng & Units 11/10/2017 09/15/2017 09/06/2017  Glucose 70 - 99 mg/dL 353(I) 144(R) -  BUN 6 - 20 mg/dL 15 11 -  Creatinine 1.54 - 1.00 mg/dL 0.08 6.76 -  Sodium 195 - 145 mmol/L 140 143 -  Potassium 3.5 - 5.1 mmol/L 4.0 5.0 -  Chloride 98 - 111 mmol/L 104 103 -  CO2 22 - 32 mmol/L 23 19(L) -  Calcium 8.9 - 10.3 mg/dL 9.0 9.6 -  Total Protein 6.5 - 8.1 g/dL 7.4 - 7.0  Total Bilirubin 0.3 - 1.2 mg/dL 0.8 - 0.7  Alkaline Phos 38 - 126 U/L 86 - 76  AST 15 - 41 U/L 20 - 20  ALT 0 - 44 U/L 16 - 14    Lipid Panel     Component Value Date/Time   CHOL 161 06/22/2017 1041   TRIG 253 (H) 06/22/2017 1041   HDL 44 06/22/2017 1041   CHOLHDL 3.7 06/22/2017 1041   CHOLHDL 2.4 10/26/2012 1052   VLDL 46 (H) 10/26/2012 1052   LDLCALC 66 06/22/2017 1041   LDLDIRECT 153 (H) 01/30/2008 2146    Lab Results  Component Value Date   HGBA1C 6.1 12/14/2017     Assessment & Plan:   1. HYPERTENSION, BENIGN ESSENTIAL Controlled Counseled on blood pressure goal of less than 130/80, low-sodium, DASH diet, medication compliance, 150 minutes of moderate intensity exercise per week. Discussed medication compliance, adverse effects. - amLODipine (NORVASC) 10 MG tablet; Take 1 tablet (10 mg total) by mouth daily.  Dispense: 90 tablet; Refill: 1  2. Hot flashes Stable - cloNIDine (CATAPRES) 0.1 MG tablet; Take 1 tablet (0.1 mg total) by mouth at bedtime as needed. For hot flashes  Dispense: 30 tablet; Refill: 3  3. Controlled type 2 diabetes mellitus without complication, without long-term current use of insulin (HCC)  Controlled with A1c of 6.1 Commenced on statin Counseled on Diabetic diet, my plate method, 597 minutes of moderate intensity exercise/week Keep blood sugar logs with fasting goals of 80-120 mg/dl, random of less than 416 and in the event of sugars less than 60 mg/dl or greater than 384 mg/dl please notify the clinic ASAP. It is recommended that you undergo annual eye exams and annual foot exams. Pneumonia vaccine is recommended. - POCT glycosylated hemoglobin (Hb A1C) - POCT glucose (manual entry) - metFORMIN (GLUCOPHAGE) 500 MG tablet; Take 1 tablet (500 mg total) by mouth daily with breakfast.  Dispense: 90 tablet; Refill: 1 - atorvastatin (LIPITOR) 20 MG tablet; Take 1 tablet (20 mg total) by mouth daily.  Dispense: 30 tablet; Refill: 3  4. Gastroesophageal reflux disease, esophagitis presence not specified Controlled  5. Screening for breast cancer - MM Digital Screening; Future  Strongly encouraged to work on quitting alcohol and we have discussed complications of alcohol abuse.  She is uninterested at this time  Meds ordered this encounter  Medications  . meloxicam (MOBIC) 7.5 MG tablet    Sig: Take 1 tablet (7.5 mg total) by mouth daily.    Dispense:  30 tablet    Refill:  1  . amLODipine (NORVASC) 10 MG tablet     Sig: Take 1 tablet (10 mg total) by mouth daily.    Dispense:  90 tablet    Refill:  1  . cloNIDine (CATAPRES) 0.1 MG tablet    Sig: Take 1 tablet (0.1 mg total) by mouth at bedtime as needed. For hot flashes    Dispense:  30 tablet    Refill:  3  . metFORMIN (GLUCOPHAGE) 500 MG tablet    Sig: Take 1 tablet (500 mg total) by mouth daily with breakfast.    Dispense:  90 tablet    Refill:  1    Discontinue previous dose  . atorvastatin (LIPITOR) 20 MG tablet    Sig: Take 1 tablet (20 mg total) by mouth daily.    Dispense:  30 tablet    Refill:  3    Follow-up: Return in about 1 month (around 01/14/2018) for Follow-up of chronic medical conditions.   Hoy Register MD

## 2017-12-15 MED FILL — MELOXICAM 7.5 MG TABLET: 7.5 | 30 days supply | Qty: 30 | Fill #0

## 2017-12-15 MED FILL — ?ATORVASTATIN 20 MG TABLET: 20 | 30 days supply | Qty: 30 | Fill #0

## 2017-12-27 MED FILL — levETIRAcetam 500 MG TABS: 500 | 30 days supply | Qty: 60 | Fill #1

## 2018-01-03 ENCOUNTER — Telehealth: Payer: Self-pay

## 2018-01-03 MED ORDER — DICLOFENAC SODIUM 75 MG PO TBEC: 75.0000 mg | DELAYED_RELEASE_TABLET | Freq: Two times a day (BID) | ORAL | 1 refills | Status: DC

## 2018-01-03 NOTE — Telephone Encounter (Signed)
Patient came into office to request a change from meloxicam to voltaren.

## 2018-01-03 NOTE — Telephone Encounter (Signed)
Done

## 2018-01-04 MED FILL — DICLOFENAC SOD EC 75 MG TAB: 75 | 30 days supply | Qty: 60 | Fill #0

## 2018-01-13 MED FILL — ?ATORVASTATIN 20 MG TABLET: 20 | 30 days supply | Qty: 30 | Fill #1

## 2018-01-13 MED FILL — levETIRAcetam 500 MG TABS: 500 | 30 days supply | Qty: 60 | Fill #1

## 2018-01-13 MED FILL — cloNIDine HCL 0.1 MG TABS: 0.1 | 30 days supply | Qty: 30 | Fill #0

## 2018-01-13 MED FILL — ?METFORMIN HCL 500MG TABS: 500 | 30 days supply | Qty: 60 | Fill #2

## 2018-01-13 MED FILL — PANTOPRAZOLE SOD DR 20 MG T: 20 | 30 days supply | Qty: 60 | Fill #2

## 2018-01-13 MED FILL — AMLODIPINE BESYLATE 10 MG T: 10 | 30 days supply | Qty: 30 | Fill #5

## 2018-01-16 ENCOUNTER — Telehealth: Payer: Self-pay

## 2018-01-16 ENCOUNTER — Ambulatory Visit: Payer: Self-pay | Admitting: Family Medicine

## 2018-01-16 NOTE — Telephone Encounter (Signed)
Patient came into office to inform PCP that she is having tingling in her fingertips.

## 2018-01-17 MED ORDER — GABAPENTIN 300 MG PO CAPS: 300.0000 mg | ORAL_CAPSULE | Freq: Every day | ORAL | 3 refills | Status: DC

## 2018-01-17 MED FILL — GABAPENTIN 300 MG CAPSULE: 300 | 30 days supply | Qty: 30 | Fill #0

## 2018-01-17 NOTE — Telephone Encounter (Signed)
Patient states that she will wait until her appointment on 01/27/18.

## 2018-01-17 NOTE — Telephone Encounter (Signed)
Tingling could be from neuropathy which could be from alcohol consumption (she is advised to quit) or from diabetic neuropathy.  I have sent a prescription for gabapentin to her pharmacy and the side effect of this medication is sedation.  She might want to start out by taking it at night

## 2018-01-17 NOTE — Addendum Note (Signed)
Addended by: Hoy Register on: 01/17/2018 01:25 PM   Modules accepted: Orders

## 2018-01-27 ENCOUNTER — Ambulatory Visit: Payer: Self-pay | Attending: Family Medicine | Admitting: Family Medicine

## 2018-01-27 ENCOUNTER — Encounter: Payer: Self-pay | Admitting: Family Medicine

## 2018-01-27 VITALS — BP 133/85 | HR 94 | Temp 98.4°F | Ht 62.0 in | Wt 184.0 lb

## 2018-01-27 DIAGNOSIS — Z7982 Long term (current) use of aspirin: Secondary | ICD-10-CM | POA: Insufficient documentation

## 2018-01-27 DIAGNOSIS — I10 Essential (primary) hypertension: Secondary | ICD-10-CM | POA: Insufficient documentation

## 2018-01-27 DIAGNOSIS — Z7984 Long term (current) use of oral hypoglycemic drugs: Secondary | ICD-10-CM | POA: Insufficient documentation

## 2018-01-27 DIAGNOSIS — R0981 Nasal congestion: Secondary | ICD-10-CM | POA: Insufficient documentation

## 2018-01-27 DIAGNOSIS — E119 Type 2 diabetes mellitus without complications: Secondary | ICD-10-CM | POA: Insufficient documentation

## 2018-01-27 DIAGNOSIS — N951 Menopausal and female climacteric states: Secondary | ICD-10-CM | POA: Insufficient documentation

## 2018-01-27 DIAGNOSIS — R232 Flushing: Secondary | ICD-10-CM

## 2018-01-27 DIAGNOSIS — R569 Unspecified convulsions: Secondary | ICD-10-CM | POA: Insufficient documentation

## 2018-01-27 LAB — GLUCOSE, POCT (MANUAL RESULT ENTRY): POC GLUCOSE: 128 mg/dL — AB (ref 70–99)

## 2018-01-27 MED ORDER — AMLODIPINE BESYLATE 10 MG PO TABS: 10.0000 mg | ORAL_TABLET | Freq: Every day | ORAL | 1 refills | Status: DC

## 2018-01-27 MED ORDER — ATORVASTATIN CALCIUM 20 MG PO TABS: 20.0000 mg | ORAL_TABLET | Freq: Every day | ORAL | 1 refills | Status: DC

## 2018-01-27 MED ORDER — CLONIDINE HCL 0.1 MG PO TABS: 0.1000 mg | ORAL_TABLET | Freq: Every evening | ORAL | 1 refills | Status: DC | PRN

## 2018-01-27 MED ORDER — CETIRIZINE HCL 10 MG PO TABS: 10.0000 mg | ORAL_TABLET | Freq: Every day | ORAL | 1 refills | Status: DC

## 2018-01-27 MED FILL — ?CETIRIZINE HCL 10 MG TABLE: 10 | 30 days supply | Qty: 30 | Fill #0

## 2018-01-27 NOTE — Progress Notes (Signed)
Subjective:  Patient ID: Christina Byrd, female    DOB: July 28, 1965  Age: 52 y.o. MRN: [id number]  CC: Diabetes   HPI Christina Byrd  is a 52 year old with a history of type 2 diabetes mellitus (A1c 6.1), pretension, alcohol abuse, new onset seizures who presents today for follow-up visit.   She denies hypoglycemia or visual concerns but has had instances when the tip of her fingers go numb intermittently when she folds them on her waist but are absent otherwise. She tolerates her antihypertensive and her hot flashes are controlled on Clonidine. She has some nasal congestion and was exposed to see a sick coworker. Denies sinus pressure, fever, myalgia. Outpatient Medications Prior to Visit  Medication Sig Dispense Refill  . aspirin EC 81 MG tablet Take 1 tablet (81 mg total) by mouth daily.    . diclofenac (VOLTAREN) 75 MG EC tablet Take 1 tablet (75 mg total) by mouth 2 (two) times daily. 60 tablet 1  . levETIRAcetam (KEPPRA) 500 MG tablet Take 1 tablet (500 mg total) by mouth 2 (two) times daily. 180 tablet 3  . magnesium oxide (MAG-OX) 400 MG tablet Take 1 tablet (400 mg total) by mouth daily. 90 tablet 0  . metFORMIN (GLUCOPHAGE) 500 MG tablet Take 1 tablet (500 mg total) by mouth daily with breakfast. 90 tablet 1  . pantoprazole (PROTONIX) 20 MG tablet TAKE 1 TABLET BY MOUTH 2 TIMES DAILY BEFORE A MEAL (Patient taking differently: Take 20 mg by mouth 2 (two) times daily before a meal. TAKE 1 TABLET BY MOUTH 2 TIMES DAILY BEFORE A MEAL) 60 tablet 2  . amLODipine (NORVASC) 10 MG tablet Take 1 tablet (10 mg total) by mouth daily. 90 tablet 1  . atorvastatin (LIPITOR) 20 MG tablet Take 1 tablet (20 mg total) by mouth daily. 30 tablet 3  . cloNIDine (CATAPRES) 0.1 MG tablet Take 1 tablet (0.1 mg total) by mouth at bedtime as needed. For hot flashes 30 tablet 3  . indomethacin (INDOCIN) 50 MG capsule Take 1 capsule (50 mg total) by mouth 3 (three) times daily with meals. Prn pain with  gout 30 capsule 1  . potassium chloride SA (K-DUR,KLOR-CON) 20 MEQ tablet Take 1 tablet (20 mEq total) by mouth 2 (two) times daily. (Patient not taking: Reported on 12/14/2017) 20 tablet 0  . gabapentin (NEURONTIN) 300 MG capsule Take 1 capsule (300 mg total) by mouth at bedtime. (Patient not taking: Reported on 01/27/2018) 30 capsule 3   No facility-administered medications prior to visit.     ROS Review of Systems  Constitutional: Negative for activity change, appetite change and fatigue.  HENT: Negative for congestion, sinus pressure and sore throat.   Eyes: Negative for visual disturbance.  Respiratory: Negative for cough, chest tightness, shortness of breath and wheezing.   Cardiovascular: Negative for chest pain and palpitations.  Gastrointestinal: Negative for abdominal distention, abdominal pain and constipation.  Endocrine: Negative for polydipsia.  Genitourinary: Negative for dysuria and frequency.  Musculoskeletal: Negative for arthralgias and back pain.  Skin: Negative for rash.  Neurological: Negative for tremors, light-headedness and numbness.  Hematological: Does not bruise/bleed easily.  Psychiatric/Behavioral: Negative for agitation and behavioral problems.    Objective:  BP 133/85   Pulse 94   Temp 98.4 F (36.9 C) (Oral)   Ht 5\' 2"  (1.575 m)   Wt 184 lb (83.5 kg)   LMP 12/17/2014   SpO2 97%   BMI 33.65 kg/m   BP/Weight 01/27/2018 12/14/2017 11/18/2017  Systolic BP 133 122 120  Diastolic BP 85 84 84  Wt. (Lbs) 184 186 184  BMI 33.65 34.02 33.65      Physical Exam  Constitutional: She is oriented to person, place, and time. She appears well-developed and well-nourished.  Cardiovascular: Normal rate, normal heart sounds and intact distal pulses.  No murmur heard. Pulmonary/Chest: Effort normal and breath sounds normal. She has no wheezes. She has no rales. She exhibits no tenderness.  Abdominal: Soft. Bowel sounds are normal. She exhibits no distension  and no mass. There is no tenderness.  Musculoskeletal: Normal range of motion.  Neurological: She is alert and oriented to person, place, and time.  Skin: Skin is warm and dry.  Psychiatric: She has a normal mood and affect.      Lab Results  Component Value Date   HGBA1C 6.1 12/14/2017    Assessment & Plan:   1. Controlled type 2 diabetes mellitus without complication, without long-term current use of insulin (HCC) Controlled Continue current regimen. Counseled on Diabetic diet, my plate method, 161 minutes of moderate intensity exercise/week Keep blood sugar logs with fasting goals of 80-120 mg/dl, random of less than 096 and in the event of sugars less than 60 mg/dl or greater than 045 mg/dl please notify the clinic ASAP. It is recommended that you undergo annual eye exams and annual foot exams. Pneumonia vaccine is recommended. - POCT glucose (manual entry) - atorvastatin (LIPITOR) 20 MG tablet; Take 1 tablet (20 mg total) by mouth daily.  Dispense: 90 tablet; Refill: 1  2. Nasal congestion - cetirizine (ZYRTEC) 10 MG tablet; Take 1 tablet (10 mg total) by mouth daily.  Dispense: 30 tablet; Refill: 1  3. HYPERTENSION, BENIGN ESSENTIAL Controlled Low sodium, DASH diet - amLODipine (NORVASC) 10 MG tablet; Take 1 tablet (10 mg total) by mouth daily.  Dispense: 90 tablet; Refill: 1  4. Hot flashes Controlled - cloNIDine (CATAPRES) 0.1 MG tablet; Take 1 tablet (0.1 mg total) by mouth at bedtime as needed. For hot flashes  Dispense: 90 tablet; Refill: 1   Meds ordered this encounter  Medications  . atorvastatin (LIPITOR) 20 MG tablet    Sig: Take 1 tablet (20 mg total) by mouth daily.    Dispense:  90 tablet    Refill:  1  . amLODipine (NORVASC) 10 MG tablet    Sig: Take 1 tablet (10 mg total) by mouth daily.    Dispense:  90 tablet    Refill:  1  . cloNIDine (CATAPRES) 0.1 MG tablet    Sig: Take 1 tablet (0.1 mg total) by mouth at bedtime as needed. For hot flashes     Dispense:  90 tablet    Refill:  1  . cetirizine (ZYRTEC) 10 MG tablet    Sig: Take 1 tablet (10 mg total) by mouth daily.    Dispense:  30 tablet    Refill:  1    Follow-up: Return in about 3 months (around 04/28/2018) for follow up of chronic medical conditions.   Hoy Register MD

## 2018-01-31 ENCOUNTER — Encounter: Payer: Self-pay | Admitting: Internal Medicine

## 2018-01-31 ENCOUNTER — Telehealth: Payer: Self-pay | Admitting: Family Medicine

## 2018-01-31 ENCOUNTER — Ambulatory Visit: Payer: Self-pay | Attending: Internal Medicine | Admitting: Internal Medicine

## 2018-01-31 VITALS — BP 129/87 | HR 107 | Temp 98.4°F | Resp 16 | Wt 184.6 lb

## 2018-01-31 DIAGNOSIS — Z885 Allergy status to narcotic agent status: Secondary | ICD-10-CM | POA: Insufficient documentation

## 2018-01-31 DIAGNOSIS — K219 Gastro-esophageal reflux disease without esophagitis: Secondary | ICD-10-CM | POA: Insufficient documentation

## 2018-01-31 DIAGNOSIS — Z888 Allergy status to other drugs, medicaments and biological substances status: Secondary | ICD-10-CM | POA: Insufficient documentation

## 2018-01-31 DIAGNOSIS — Z880 Allergy status to penicillin: Secondary | ICD-10-CM | POA: Insufficient documentation

## 2018-01-31 DIAGNOSIS — Z6833 Body mass index (BMI) 33.0-33.9, adult: Secondary | ICD-10-CM | POA: Insufficient documentation

## 2018-01-31 DIAGNOSIS — Z7982 Long term (current) use of aspirin: Secondary | ICD-10-CM | POA: Insufficient documentation

## 2018-01-31 DIAGNOSIS — Z881 Allergy status to other antibiotic agents status: Secondary | ICD-10-CM | POA: Insufficient documentation

## 2018-01-31 DIAGNOSIS — H65191 Other acute nonsuppurative otitis media, right ear: Secondary | ICD-10-CM | POA: Insufficient documentation

## 2018-01-31 DIAGNOSIS — E669 Obesity, unspecified: Secondary | ICD-10-CM | POA: Insufficient documentation

## 2018-01-31 DIAGNOSIS — Z8249 Family history of ischemic heart disease and other diseases of the circulatory system: Secondary | ICD-10-CM | POA: Insufficient documentation

## 2018-01-31 DIAGNOSIS — Z7984 Long term (current) use of oral hypoglycemic drugs: Secondary | ICD-10-CM | POA: Insufficient documentation

## 2018-01-31 DIAGNOSIS — I10 Essential (primary) hypertension: Secondary | ICD-10-CM | POA: Insufficient documentation

## 2018-01-31 DIAGNOSIS — Z87891 Personal history of nicotine dependence: Secondary | ICD-10-CM | POA: Insufficient documentation

## 2018-01-31 DIAGNOSIS — Z79899 Other long term (current) drug therapy: Secondary | ICD-10-CM | POA: Insufficient documentation

## 2018-01-31 DIAGNOSIS — E119 Type 2 diabetes mellitus without complications: Secondary | ICD-10-CM | POA: Insufficient documentation

## 2018-01-31 MED ORDER — AMOXICILLIN-POT CLAVULANATE 875-125 MG PO TABS: 1.0000 | ORAL_TABLET | Freq: Two times a day (BID) | ORAL | 0 refills | Status: DC

## 2018-01-31 MED FILL — AMOX-CLAV 875-125 MG TABLET: 875-125 | 7 days supply | Qty: 14 | Fill #0

## 2018-01-31 NOTE — Progress Notes (Signed)
Patient ID: Christina Byrd, female    DOB: 15-Aug-1965  MRN: [id number]  CC: Ear Pain   Subjective: Christina Byrd is a 52 y.o. female who presents for UC visit.  Her concerns today include:  Patient with history of HTN, GERD, diabetes type 2, gout, ETOH abuse with withdrawal sz,   C/o constant RT ear pain since yesterday.  No drainage.  She reports using a hair pin sometimes in the ear because it itches.  No fevers. Patient Active Problem List   Diagnosis Date Noted  . New onset type 2 diabetes mellitus (HCC) 09/13/2016  . Traumatic rhabdomyolysis (HCC) 07/23/2016  . Depression 07/08/2016  . Cocaine use   . Hot flash, menopausal 06/22/2016  . Intertrigo 06/22/2016  . Gout of left ankle 10/09/2015  . Left hip pain 09/02/2015  . EtOH dependence (HCC) 09/02/2015  . Colonoscopy refused 09/02/2015  . Hyperuricemia 05/27/2015  . 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 Pain and swelling of left ankle 12/02/2014  . Tendinitis of right hip flexor 07/23/2014  . Allergic rhinitis 05/13/2014  . GERD (gastroesophageal reflux disease) 04/01/2014  . Tendonitis, Achilles, right 04/01/2014  . Osteoarthritis of left knee 12/11/2013  . Ex-smoker 11/09/2008  . OBESITY 08/12/2006  . HYPERTENSION, BENIGN ESSENTIAL 08/12/2006     Current Outpatient Medications on File Prior to Visit  Medication Sig Dispense Refill  . amLODipine (NORVASC) 10 MG tablet Take 1 tablet (10 mg total) by mouth daily. 90 tablet 1  . aspirin EC 81 MG tablet Take 1 tablet (81 mg total) by mouth daily.    Marland Kitchen atorvastatin (LIPITOR) 20 MG tablet Take 1 tablet (20 mg total) by mouth daily. 90 tablet 1  . cetirizine (ZYRTEC) 10 MG tablet Take 1 tablet (10 mg total) by mouth daily. 30 tablet 1  . cloNIDine (CATAPRES) 0.1 MG tablet Take 1 tablet (0.1 mg total) by mouth at bedtime as needed. For hot flashes 90 tablet 1  . diclofenac (VOLTAREN) 75 MG EC tablet Take 1 tablet (75 mg total) by mouth 2 (two) times  daily. 60 tablet 1  . levETIRAcetam (KEPPRA) 500 MG tablet Take 1 tablet (500 mg total) by mouth 2 (two) times daily. 180 tablet 3  . magnesium oxide (MAG-OX) 400 MG tablet Take 1 tablet (400 mg total) by mouth daily. 90 tablet 0  . metFORMIN (GLUCOPHAGE) 500 MG tablet Take 1 tablet (500 mg total) by mouth daily with breakfast. 90 tablet 1  . pantoprazole (PROTONIX) 20 MG tablet TAKE 1 TABLET BY MOUTH 2 TIMES DAILY BEFORE A MEAL (Patient taking differently: Take 20 mg by mouth 2 (two) times daily before a meal. TAKE 1 TABLET BY MOUTH 2 TIMES DAILY BEFORE A MEAL) 60 tablet 2  . potassium chloride SA (K-DUR,KLOR-CON) 20 MEQ tablet Take 1 tablet (20 mEq total) by mouth 2 (two) times daily. (Patient not taking: Reported on 12/14/2017) 20 tablet 0   No current facility-administered medications on file prior to visit.     Allergies  Allergen Reactions  . Losartan Potassium     Rash  . Levaquin [Levofloxacin In D5w] Rash  . Ace Inhibitors Cough    REACTION: cough  . Codeine Nausea Only  . Cefepime Rash    09/04/12 pm Patient started to break out with small macules after IV Vanco infusion, then macules increased in size after starting cefepime infusion.  . Vancomycin Rash    09/04/12 pm Patient started to break out with small macules  after IV Vanco infusion, then macules increased in size after starting cefepime infusion.    Social History   Socioeconomic History  . Marital status: Single    Spouse name: Not on file  . Number of children: Not on file  . Years of education: Not on file  . Highest education level: Not on file  Occupational History  . Not on file  Social Needs  . Financial resource strain: Not on file  . Food insecurity:    Worry: Not on file    Inability: Not on file  . Transportation needs:    Medical: Not on file    Non-medical: Not on file  Tobacco Use  . Smoking status: Former Smoker    Last attempt to quit: 06/25/2012    Years since quitting: 5.6  . Smokeless  tobacco: Never Used  Substance and Sexual Activity  . Alcohol use: Yes    Alcohol/week: 3.0 standard drinks    Types: 3 Standard drinks or equivalent per week    Comment: ADMITS TO DRINKING 3-4 BEERS/DAY  . Drug use: No  . Sexual activity: Not on file  Lifestyle  . Physical activity:    Days per week: Not on file    Minutes per session: Not on file  . Stress: Not on file  Relationships  . Social connections:    Talks on phone: Not on file    Gets together: Not on file    Attends religious service: Not on file    Active member of club or organization: Not on file    Attends meetings of clubs or organizations: Not on file    Relationship status: Not on file  . Intimate partner violence:    Fear of current or ex partner: Not on file    Emotionally abused: Not on file    Physically abused: Not on file    Forced sexual activity: Not on file  Other Topics Concern  . Not on file  Social History Narrative   Pt lives in single story home with her partner   Has 1 son   5 grandchildren   10th grade education   Works at [employer].     Family History  Problem Relation Age of Onset  . CAD Mother     No past surgical history on file.  ROS: Review of Systems Clear except as above.    PHYSICAL EXAM: BP 129/87   Pulse (!) 107   Temp 98.4 F (36.9 C) (Oral)   Resp 16   Wt 184 lb 9.6 oz (83.7 kg)   LMP 12/17/2014   SpO2 98%   BMI 33.76 kg/m   Physical Exam  General appearance - alert, well appearing, and in no distress Mental status - normal mood, behavior, speech, dress, motor activity, and thought processes Ears -right: Canal is erythematous without exudate.  Tympanic membrane erythematous and bulging. Mouth - mucous membranes moist, pharynx normal without lesions Neck -no cervical lymphadenopathy.   ASSESSMENT AND PLAN:  1. Other non-recurrent acute nonsuppurative otitis media of right ear Will treat with Augmentin Reports having taken PCN in past without  reaction. - amoxicillin-clavulanate (AUGMENTIN) 875-125 MG tablet; Take 1 tablet by mouth 2 (two) times daily.  Dispense: 14 tablet; Refill: 0   Patient was given the opportunity to ask questions.  Patient verbalized understanding of the plan and was able to repeat key elements of the plan.   No orders of the defined types were placed in this encounter.  Requested Prescriptions   Signed Prescriptions Disp Refills  . amoxicillin-clavulanate (AUGMENTIN) 875-125 MG tablet 14 tablet 0    Sig: Take 1 tablet by mouth 2 (two) times daily.    Return if symptoms worsen or fail to improve.  Jonah Blue, MD, FACP

## 2018-01-31 NOTE — Telephone Encounter (Signed)
Patient was called and informed that she could take Alleve or ibuprofen.

## 2018-01-31 NOTE — Telephone Encounter (Signed)
Pt came in to request advice on -amoxicillin-clavulanate (AUGMENTIN) 875-125 MG tablet She just wants to know if its ok to continue her old pain medications with this new medicine. Please advise

## 2018-01-31 NOTE — Patient Instructions (Signed)
Otitis Media, Adult Otitis media is redness, soreness, and puffiness (swelling) in the space just behind your eardrum (middle ear). It may be caused by allergies or infection. It often happens along with a cold. Follow these instructions at home:  Take your medicine as told. Finish it even if you start to feel better.  Only take over-the-counter or prescription medicines for pain, discomfort, or fever as told by your doctor.  Follow up with your doctor as told. Contact a doctor if:  You have otitis media only in one ear, or bleeding from your nose, or both.  You notice a lump on your neck.  You are not getting better in 3-5 days.  You feel worse instead of better. Get help right away if:  You have pain that is not helped with medicine.  You have puffiness, redness, or pain around your ear.  You get a stiff neck.  You cannot move part of your face (paralysis).  You notice that the bone behind your ear hurts when you touch it. This information is not intended to replace advice given to you by your health care provider. Make sure you discuss any questions you have with your health care provider. Document Released: 07/28/2007 Document Revised: 07/17/2015 Document Reviewed: 09/05/2012 Elsevier Interactive Patient Education  2017 Elsevier Inc.  

## 2018-02-01 ENCOUNTER — Telehealth: Payer: Self-pay | Admitting: Family Medicine

## 2018-02-01 MED ORDER — BENZONATATE 100 MG PO CAPS: 100.0000 mg | ORAL_CAPSULE | Freq: Two times a day (BID) | ORAL | 0 refills | Status: DC | PRN

## 2018-02-01 MED FILL — BENZONATATE 100 MG CAP: 100 | 10 days supply | Qty: 20 | Fill #0

## 2018-02-01 NOTE — Telephone Encounter (Signed)
Patient was seen on 01/27/18 for nasal congestion then on 01/31/18 for ear pain. She is asking for cough medication, I'm not sure if this was discussed in one of her appointments. If appropriate please send RX for cough or make OTC recommendation we can share with the patient.

## 2018-02-01 NOTE — Telephone Encounter (Signed)
Pt called to request cough medication called in to the pharmacy here.

## 2018-02-01 NOTE — Telephone Encounter (Signed)
Tessalon Perles sent to pharmacy

## 2018-02-08 MED FILL — AMLODIPINE BESYLATE 10 MG T: 10 | 30 days supply | Qty: 30 | Fill #0

## 2018-02-10 ENCOUNTER — Ambulatory Visit: Payer: Self-pay | Attending: Internal Medicine | Admitting: Internal Medicine

## 2018-02-10 VITALS — BP 115/80 | HR 92 | Temp 98.0°F

## 2018-02-10 DIAGNOSIS — Z881 Allergy status to other antibiotic agents status: Secondary | ICD-10-CM | POA: Insufficient documentation

## 2018-02-10 DIAGNOSIS — Z7982 Long term (current) use of aspirin: Secondary | ICD-10-CM | POA: Insufficient documentation

## 2018-02-10 DIAGNOSIS — Z87891 Personal history of nicotine dependence: Secondary | ICD-10-CM | POA: Insufficient documentation

## 2018-02-10 DIAGNOSIS — Z885 Allergy status to narcotic agent status: Secondary | ICD-10-CM | POA: Insufficient documentation

## 2018-02-10 DIAGNOSIS — I10 Essential (primary) hypertension: Secondary | ICD-10-CM | POA: Insufficient documentation

## 2018-02-10 DIAGNOSIS — K219 Gastro-esophageal reflux disease without esophagitis: Secondary | ICD-10-CM | POA: Insufficient documentation

## 2018-02-10 DIAGNOSIS — E119 Type 2 diabetes mellitus without complications: Secondary | ICD-10-CM | POA: Insufficient documentation

## 2018-02-10 DIAGNOSIS — H6691 Otitis media, unspecified, right ear: Secondary | ICD-10-CM | POA: Insufficient documentation

## 2018-02-10 DIAGNOSIS — Z8249 Family history of ischemic heart disease and other diseases of the circulatory system: Secondary | ICD-10-CM | POA: Insufficient documentation

## 2018-02-10 DIAGNOSIS — L309 Dermatitis, unspecified: Secondary | ICD-10-CM | POA: Insufficient documentation

## 2018-02-10 DIAGNOSIS — H6983 Other specified disorders of Eustachian tube, bilateral: Secondary | ICD-10-CM | POA: Insufficient documentation

## 2018-02-10 DIAGNOSIS — Z7984 Long term (current) use of oral hypoglycemic drugs: Secondary | ICD-10-CM | POA: Insufficient documentation

## 2018-02-10 DIAGNOSIS — M109 Gout, unspecified: Secondary | ICD-10-CM | POA: Insufficient documentation

## 2018-02-10 DIAGNOSIS — Z79899 Other long term (current) drug therapy: Secondary | ICD-10-CM | POA: Insufficient documentation

## 2018-02-10 LAB — GLUCOSE, POCT (MANUAL RESULT ENTRY): POC Glucose: 123 mg/dl — AB (ref 70–99)

## 2018-02-10 MED FILL — GABAPENTIN 300 MG CAPSULE: 300 | 30 days supply | Qty: 30 | Fill #0

## 2018-02-10 NOTE — Progress Notes (Signed)
Patient ID: Christina Byrd, female    DOB: 01/07/1966  MRN: [id number]  CC: Ear Pain   Subjective: Christina Byrd is a 52 y.o. female who presents for UC visit Her concerns today include:  Patient with history of HTN, GERD,diabetes type 2, gout, ETOH abuse with withdrawal sz,  Seen 01/31/2018 for pain in RT ear.  Diagnosed with otitis media.  Prescribed Augmentin which she has completed.  She reports that the pain has resolved.  She wanted to follow-up to make sure that it has completely resolved.  Every now and again she gets a feeling in both ears like they are stopped up.  Planes of having a rash on the abdomen for the past week.  It is a small circular spot that has been increasing in size and slightly itchy.  No initiating factors.   Patient Active Problem List   Diagnosis Date Noted  . New onset type 2 diabetes mellitus (HCC) 09/13/2016  . Traumatic rhabdomyolysis (HCC) 07/23/2016  . Depression 07/08/2016  . Cocaine use   . Hot flash, menopausal 06/22/2016  . Intertrigo 06/22/2016  . Gout of left ankle 10/09/2015  . Left hip pain 09/02/2015  . EtOH dependence (HCC) 09/02/2015  . Colonoscopy refused 09/02/2015  . Hyperuricemia 05/27/2015  . 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 Pain and swelling of left ankle 12/02/2014  . Tendinitis of right hip flexor 07/23/2014  . Allergic rhinitis 05/13/2014  . GERD (gastroesophageal reflux disease) 04/01/2014  . Tendonitis, Achilles, right 04/01/2014  . Osteoarthritis of left knee 12/11/2013  . Ex-smoker 11/09/2008  . OBESITY 08/12/2006  . HYPERTENSION, BENIGN ESSENTIAL 08/12/2006     Current Outpatient Medications on File Prior to Visit  Medication Sig Dispense Refill  . amLODipine (NORVASC) 10 MG tablet Take 1 tablet (10 mg total) by mouth daily. 90 tablet 1  . aspirin EC 81 MG tablet Take 1 tablet (81 mg total) by mouth daily.    Marland Kitchen atorvastatin (LIPITOR) 20 MG tablet Take 1 tablet (20 mg total) by  mouth daily. 90 tablet 1  . cetirizine (ZYRTEC) 10 MG tablet Take 1 tablet (10 mg total) by mouth daily. 30 tablet 1  . cloNIDine (CATAPRES) 0.1 MG tablet Take 1 tablet (0.1 mg total) by mouth at bedtime as needed. For hot flashes 90 tablet 1  . diclofenac (VOLTAREN) 75 MG EC tablet Take 1 tablet (75 mg total) by mouth 2 (two) times daily. 60 tablet 1  . levETIRAcetam (KEPPRA) 500 MG tablet Take 1 tablet (500 mg total) by mouth 2 (two) times daily. 180 tablet 3  . magnesium oxide (MAG-OX) 400 MG tablet Take 1 tablet (400 mg total) by mouth daily. 90 tablet 0  . metFORMIN (GLUCOPHAGE) 500 MG tablet Take 1 tablet (500 mg total) by mouth daily with breakfast. 90 tablet 1  . pantoprazole (PROTONIX) 20 MG tablet TAKE 1 TABLET BY MOUTH 2 TIMES DAILY BEFORE A MEAL (Patient taking differently: Take 20 mg by mouth 2 (two) times daily before a meal. TAKE 1 TABLET BY MOUTH 2 TIMES DAILY BEFORE A MEAL) 60 tablet 2  . potassium chloride SA (K-DUR,KLOR-CON) 20 MEQ tablet Take 1 tablet (20 mEq total) by mouth 2 (two) times daily. (Patient not taking: Reported on 12/14/2017) 20 tablet 0   No current facility-administered medications on file prior to visit.     Allergies  Allergen Reactions  . Losartan Potassium     Rash  . Levaquin [Levofloxacin In D5w] Rash  .  Ace Inhibitors Cough    REACTION: cough  . Codeine Nausea Only  . Cefepime Rash    09/04/12 pm Patient started to break out with small macules after IV Vanco infusion, then macules increased in size after starting cefepime infusion.  . Vancomycin Rash    09/04/12 pm Patient started to break out with small macules after IV Vanco infusion, then macules increased in size after starting cefepime infusion.    Social History   Socioeconomic History  . Marital status: Single    Spouse name: Not on file  . Number of children: Not on file  . Years of education: Not on file  . Highest education level: Not on file  Occupational History  . Not on file    Social Needs  . Financial resource strain: Not on file  . Food insecurity:    Worry: Not on file    Inability: Not on file  . Transportation needs:    Medical: Not on file    Non-medical: Not on file  Tobacco Use  . Smoking status: Former Smoker    Last attempt to quit: 06/25/2012    Years since quitting: 5.6  . Smokeless tobacco: Never Used  Substance and Sexual Activity  . Alcohol use: Yes    Alcohol/week: 3.0 standard drinks    Types: 3 Standard drinks or equivalent per week    Comment: ADMITS TO DRINKING 3-4 BEERS/DAY  . Drug use: No  . Sexual activity: Not on file  Lifestyle  . Physical activity:    Days per week: Not on file    Minutes per session: Not on file  . Stress: Not on file  Relationships  . Social connections:    Talks on phone: Not on file    Gets together: Not on file    Attends religious service: Not on file    Active member of club or organization: Not on file    Attends meetings of clubs or organizations: Not on file    Relationship status: Not on file  . Intimate partner violence:    Fear of current or ex partner: Not on file    Emotionally abused: Not on file    Physically abused: Not on file    Forced sexual activity: Not on file  Other Topics Concern  . Not on file  Social History Narrative   Pt lives in single story home with her partner   Has 1 son   5 grandchildren   10th grade education   Works at [employer].     Family History  Problem Relation Age of Onset  . CAD Mother     No past surgical history on file.  ROS: Review of Systems Negative except as above PHYSICAL EXAM: BP 115/80   Pulse 92   Temp 98 F (36.7 C) (Oral)   LMP 12/17/2014   SpO2 96%   Physical Exam  General appearance - alert, well appearing, and in no distress Ears - bilateral TM's and external ear canals normal Skin -1 cm macular circular area on the right side of the abdomen.  It has distinct border with clearing inside the borders Results for  orders placed or performed in visit on 02/10/18  POCT glucose (manual entry)  Result Value Ref Range   POC Glucose 123 (A) 70 - 99 mg/dl    ASSESSMENT AND PLAN: 1. Dysfunction of both eustachian tubes This is not too bothersome for her.  Normally I would recommend Sudafed but she is  on blood pressure medications.  Right otitis media has resolved  2. Dermatitis This looks like tinea type infection.  I recommend purchasing and using Lamisil cream over-the-counter  3. Controlled type 2 diabetes mellitus without complication, without long-term current use of insulin (HCC) - POCT glucose (manual entry)     Patient was given the opportunity to ask questions.  Patient verbalized understanding of the plan and was able to repeat key elements of the plan.   Orders Placed This Encounter  Procedures  . POCT glucose (manual entry)     Requested Prescriptions    No prescriptions requested or ordered in this encounter    Return if symptoms worsen or fail to improve.  Jonah Blue, MD, FACP

## 2018-02-23 ENCOUNTER — Telehealth: Payer: Self-pay | Admitting: Family Medicine

## 2018-02-23 NOTE — Telephone Encounter (Signed)
Patient was called and patient will like some medication to calm down her hormones.

## 2018-02-23 NOTE — Telephone Encounter (Signed)
Patient called because she says she has been having some female problems and would like to get advice regarding those issues. Patient would also like to get medication advice.   Please follow up.

## 2018-02-24 NOTE — Telephone Encounter (Signed)
Needs an Office visit to discuss

## 2018-03-01 NOTE — Telephone Encounter (Signed)
Patient called stating on her last OV the provider that she saw wrote down the name of an OTC cream she could buy b/c she gets spots on her stomach. Patient states she lost the paper where the name of the cream was written. Please f/u.

## 2018-03-02 NOTE — Telephone Encounter (Signed)
Patient was called and informed of cream the provider informed her to get OTC. Patient was also informed of PCP orders as to make an OV to discuss matter.

## 2018-03-08 ENCOUNTER — Other Ambulatory Visit: Payer: Self-pay | Admitting: Family Medicine

## 2018-03-08 DIAGNOSIS — K219 Gastro-esophageal reflux disease without esophagitis: Secondary | ICD-10-CM

## 2018-03-08 MED FILL — ?METFORMIN HCL 500MG TABLET: 500 | 30 days supply | Qty: 60 | Fill #3

## 2018-03-08 MED FILL — AMLODIPINE BESYLATE 10 MG T: 10 | 30 days supply | Qty: 30 | Fill #1

## 2018-03-08 MED FILL — levETIRAcetam 500 MG TABS: 500 | 30 days supply | Qty: 60 | Fill #2

## 2018-03-08 MED FILL — ?ATORVASTATIN 20 MG TABLET: 20 | 30 days supply | Qty: 30 | Fill #2

## 2018-03-08 MED FILL — ?CLONIDINE HCL 0.1 MG TABL: 0.1 | 30 days supply | Qty: 30 | Fill #1

## 2018-03-09 MED FILL — PANTOPRAZOLE SOD DR 20 MG T: 20 | 30 days supply | Qty: 60 | Fill #0

## 2018-03-13 NOTE — Progress Notes (Signed)
Subjective:    Patient ID: Christina Byrd, female    DOB: 05/29/1965, 53 y.o.   MRN: [id number]  53 y.o.F with recent Dx otits media.  Patient with history of HTN, GERD, diabetes type 2, gout, ETOH use with withdrawal sz,  Needs TDap. LDL not at goal  06/2017  The patient was last seen in May 2019 and is here today for follow-up of recent urgent care visits related to otitis media.  The patient's ears are better and all the symptoms have resolved from that.  Patient is here today for  follow-up of diabetes and hypertension.  Note the patient does need a tetanus vaccine today  Diabetes  She presents for her follow-up diabetic visit. She has type 2 diabetes mellitus. There are no hypoglycemic associated symptoms. Pertinent negatives for hypoglycemia include no dizziness, headaches, seizures, speech difficulty or tremors. Pertinent negatives for diabetes include no blurred vision, no chest pain, no foot ulcerations, no polydipsia, no polyphagia, no polyuria and no weakness. There are no hypoglycemic complications. Symptoms are stable. Pertinent negatives for diabetic complications include no autonomic neuropathy, CVA, heart disease, nephropathy, peripheral neuropathy, PVD or retinopathy. Risk factors for coronary artery disease include diabetes mellitus and family history. She is compliant with treatment all of the time. Home blood sugar record trend: not monitoring CBGs.  Hypertension  This is a chronic problem. The current episode started more than 1 year ago. Pertinent negatives include no blurred vision, chest pain, headaches, palpitations, peripheral edema, PND or shortness of breath. There is no history of CVA, PVD or retinopathy.   Wt Readings from Last 3 Encounters:  03/14/18 183 lb (83 kg)  01/31/18 184 lb 9.6 oz (83.7 kg)  01/27/18 184 lb (83.5 kg)     Review of Systems  Constitutional: Negative.   HENT: Negative.   Eyes: Negative.  Negative for blurred vision.  Respiratory:  Negative for cough, choking, chest tightness, shortness of breath and wheezing.   Cardiovascular: Negative for chest pain, palpitations, leg swelling and PND.  Endocrine: Negative for polydipsia, polyphagia and polyuria.  Genitourinary: Negative.   Neurological: Negative for dizziness, tremors, seizures, syncope, speech difficulty, weakness, light-headedness and headaches.  Hematological: Negative for adenopathy. Does not bruise/bleed easily.  Psychiatric/Behavioral: Negative.        Objective:   Physical Exam Vitals:   03/14/18 0911  BP: 116/76  Pulse: (!) 108  Resp: 18  Temp: 98.2 F (36.8 C)  TempSrc: Oral  SpO2: 99%  Weight: 183 lb (83 kg)  Height: '5\' 1"'$  (1.549 m)    Gen: Pleasant, well-nourished, in no distress,  normal affect  ENT: No lesions,  mouth clear,  oropharynx clear, no postnasal drip, ear is clear   Neck: No JVD, no TMG, no carotid bruits  Lungs: No use of accessory muscles, no dullness to percussion, clear without rales or rhonchi  Cardiovascular: RRR, heart sounds normal, no murmur or gallops, no peripheral edema  Abdomen: soft and NT, no HSM,  BS normal  Musculoskeletal: No deformities, no cyanosis or clubbing  Neuro: alert, non focal  Skin: Warm, no lesions or rashes  Foot exam performed No lesions, no concerns.  Normal sensation  No results found.  CBC Latest Ref Rng & Units 11/10/2017 09/05/2017 07/09/2016  WBC 4.0 - 10.5 K/uL 8.7 9.9 10.2  Hemoglobin 12.0 - 15.0 g/dL 12.8 13.4 11.4(L)  Hematocrit 36.0 - 46.0 % 39.6 40.9 35.7(L)  Platelets 150 - 400 K/uL 197 220 214   Lab Results  Component Value Date   CHOL 161 06/22/2017   CHOL 194 10/26/2012   Lab Results  Component Value Date   HDL 44 06/22/2017   HDL 82 10/26/2012   Lab Results  Component Value Date   LDLCALC 66 06/22/2017   LDLCALC 66 10/26/2012   Lab Results  Component Value Date   TRIG 253 (H) 06/22/2017   TRIG 230 (H) 10/26/2012   Lab Results  Component Value Date     CHOLHDL 3.7 06/22/2017   CHOLHDL 2.4 10/26/2012   Lab Results  Component Value Date   LDLDIRECT 153 (H) 01/30/2008   LDLDIRECT 133 (H) 08/12/2006   BMP Latest Ref Rng & Units 11/10/2017 09/15/2017 09/05/2017  Glucose 70 - 99 mg/dL 126(H) 123(H) 152(H)  BUN 6 - 20 mg/dL '15 11 14  '$ Creatinine 0.44 - 1.00 mg/dL 0.93 0.97 1.33(H)  BUN/Creat Ratio 9 - 23 - 11 -  Sodium 135 - 145 mmol/L 140 143 143  Potassium 3.5 - 5.1 mmol/L 4.0 5.0 2.9(L)  Chloride 98 - 111 mmol/L 104 103 104  CO2 22 - 32 mmol/L 23 19(L) 22  Calcium 8.9 - 10.3 mg/dL 9.0 9.6 7.9(L)   HgbA1C 6.8 08/2017 cbg today: 177     Assessment & Plan:  I personally reviewed all images and lab data in the Christina Byrd system as well as any outside material available during this office visit and agree with the  radiology impressions.   HYPERTENSION, BENIGN ESSENTIAL Essential hypertension with adequate control  No change in current medications  Non-recurrent acute suppurative otitis media of right ear without spontaneous rupture of tympanic membrane History of otitis media of the right ear now resolved  No further antibiotics indicated  New onset type 2 diabetes mellitus (Christina Byrd) Type 2 diabetes with recent adequate control last hemoglobin A1c was 6.8 CBG today was 177 not fasting kidney function in September 2019 was normal  Foot exam was normal  Plan  Maintain metformin Obtain hemoglobin A1c at this visit Testing supplies for CBGs were given to the patient  EtOH dependence (Christina Byrd) History of alcohol use which appears to now be resolved  Hypomagnesemia History of low magnesium levels  Magnesium oxide 400 mg to take one daily was represcribed   Christina Byrd was seen today for medication refill.  Diagnoses and all orders for this visit:  New onset type 2 diabetes mellitus (Christina Byrd) -     Hemoglobin A1c -     Glucose (CBG)  Hypomagnesemia -     magnesium oxide (MAG-OX) 400 MG tablet; Take 1 tablet (400 mg total) by mouth  daily.  Non-recurrent acute suppurative otitis media of right ear without spontaneous rupture of tympanic membrane  HYPERTENSION, BENIGN ESSENTIAL  Alcohol dependence with unspecified alcohol-induced disorder (HCC)  Other orders -     glucose blood (TRUE METRIX BLOOD GLUCOSE TEST) test strip; Use as instructed -     Blood Glucose Monitoring Suppl (TRUE METRIX METER) w/Device KIT; Test blood sugar once daily -     TRUEPLUS LANCETS 28G MISC; Test sugar daily with lancet -     Tdap (BOOSTRIX) 5-2.5-18.5 LF-MCG/0.5 injection; Inject 0.5 mLs into the muscle once for 1 dose.   The patient was due a tetanus vaccine and this was given at this visit

## 2018-03-14 ENCOUNTER — Encounter: Payer: Self-pay | Admitting: Critical Care Medicine

## 2018-03-14 ENCOUNTER — Ambulatory Visit: Payer: Self-pay | Attending: Critical Care Medicine | Admitting: Critical Care Medicine

## 2018-03-14 VITALS — BP 116/76 | HR 108 | Temp 98.2°F | Resp 18 | Ht 61.0 in | Wt 183.0 lb

## 2018-03-14 DIAGNOSIS — M109 Gout, unspecified: Secondary | ICD-10-CM | POA: Insufficient documentation

## 2018-03-14 DIAGNOSIS — H66001 Acute suppurative otitis media without spontaneous rupture of ear drum, right ear: Secondary | ICD-10-CM | POA: Insufficient documentation

## 2018-03-14 DIAGNOSIS — Z23 Encounter for immunization: Secondary | ICD-10-CM | POA: Insufficient documentation

## 2018-03-14 DIAGNOSIS — F1029 Alcohol dependence with unspecified alcohol-induced disorder: Secondary | ICD-10-CM

## 2018-03-14 DIAGNOSIS — K219 Gastro-esophageal reflux disease without esophagitis: Secondary | ICD-10-CM | POA: Insufficient documentation

## 2018-03-14 DIAGNOSIS — I10 Essential (primary) hypertension: Secondary | ICD-10-CM | POA: Insufficient documentation

## 2018-03-14 DIAGNOSIS — E119 Type 2 diabetes mellitus without complications: Secondary | ICD-10-CM | POA: Insufficient documentation

## 2018-03-14 DIAGNOSIS — Z760 Encounter for issue of repeat prescription: Secondary | ICD-10-CM | POA: Insufficient documentation

## 2018-03-14 LAB — GLUCOSE, POCT (MANUAL RESULT ENTRY): POC Glucose: 177 mg/dl — AB (ref 70–99)

## 2018-03-14 MED ORDER — TETANUS-DIPHTH-ACELL PERTUSSIS 5-2.5-18.5 LF-MCG/0.5 IM SUSP: 0.5000 mL | Freq: Once | INTRAMUSCULAR | 0 refills | Status: AC

## 2018-03-14 MED ORDER — TRUE METRIX METER W/DEVICE KIT: PACK | 0 refills | Status: DC

## 2018-03-14 MED ORDER — TRUEPLUS LANCETS 28G MISC: 2 refills | Status: DC

## 2018-03-14 MED ORDER — MAGNESIUM OXIDE 400 MG PO TABS: 400.0000 mg | ORAL_TABLET | Freq: Every day | ORAL | 3 refills | Status: DC

## 2018-03-14 MED ORDER — GLUCOSE BLOOD VI STRP: ORAL_STRIP | 2 refills | Status: DC

## 2018-03-14 MED FILL — TRUEplus LANCETS 28G MISC: 30 days supply | Qty: 100 | Fill #0

## 2018-03-14 MED FILL — TRUE METRIX TEST STRIP: 30 days supply | Qty: 100 | Fill #0

## 2018-03-14 MED FILL — !TRUE METRIX BLOOD GLUCOSE: 1 days supply | Qty: 1 | Fill #0

## 2018-03-14 MED FILL — MAGNESIUM OXIDE 400 MG TAB: 400 | 30 days supply | Qty: 30 | Fill #0

## 2018-03-14 NOTE — Patient Instructions (Addendum)
No change in medications A refill on your magnesium was sent to our pharmacy A tetanus vaccine was given Keep your March appt with Dr Alvis Lemmings

## 2018-03-14 NOTE — Assessment & Plan Note (Signed)
History of otitis media of the right ear now resolved  No further antibiotics indicated

## 2018-03-14 NOTE — Assessment & Plan Note (Signed)
History of alcohol use which appears to now be resolved

## 2018-03-14 NOTE — Assessment & Plan Note (Signed)
>>  ASSESSMENT AND PLAN FOR ALCOHOL USE DISORDER WRITTEN ON 03/14/2018 12:37 PM BY WRIGHT, PATRICK E, MD  History of alcohol use which appears to now be resolved

## 2018-03-14 NOTE — Assessment & Plan Note (Addendum)
Type 2 diabetes with recent adequate control last hemoglobin A1c was 6.8 CBG today was 177 not fasting kidney function in September 2019 was normal  Foot exam was normal  Plan  Maintain metformin Obtain hemoglobin A1c at this visit Testing supplies for CBGs were given to the patient

## 2018-03-14 NOTE — Assessment & Plan Note (Signed)
Essential hypertension with adequate control  No change in current medications

## 2018-03-14 NOTE — Assessment & Plan Note (Addendum)
History of low magnesium levels  Magnesium oxide 400 mg to take one daily was represcribed

## 2018-03-15 ENCOUNTER — Telehealth: Payer: Self-pay | Admitting: *Deleted

## 2018-03-15 LAB — HEMOGLOBIN A1C
ESTIMATED AVERAGE GLUCOSE: 120 mg/dL
HEMOGLOBIN A1C: 5.8 % — AB (ref 4.8–5.6)

## 2018-03-15 NOTE — Telephone Encounter (Signed)
-----   Message from Storm Frisk, MD sent at 03/15/2018  8:21 AM EST ----- Let the pt know her A1c is 5.8  Keep up good work

## 2018-03-15 NOTE — Telephone Encounter (Signed)
Patient is aware of A1C being 5.8 and to keep with current regimen.

## 2018-03-22 ENCOUNTER — Ambulatory Visit: Payer: Self-pay | Attending: Family Medicine | Admitting: Physician Assistant

## 2018-03-22 VITALS — BP 134/88 | HR 97 | Temp 98.8°F | Resp 16 | Wt 186.4 lb

## 2018-03-22 DIAGNOSIS — E119 Type 2 diabetes mellitus without complications: Secondary | ICD-10-CM | POA: Insufficient documentation

## 2018-03-22 DIAGNOSIS — Z7982 Long term (current) use of aspirin: Secondary | ICD-10-CM | POA: Insufficient documentation

## 2018-03-22 DIAGNOSIS — I10 Essential (primary) hypertension: Secondary | ICD-10-CM | POA: Insufficient documentation

## 2018-03-22 DIAGNOSIS — Z7984 Long term (current) use of oral hypoglycemic drugs: Secondary | ICD-10-CM | POA: Insufficient documentation

## 2018-03-22 DIAGNOSIS — Z79899 Other long term (current) drug therapy: Secondary | ICD-10-CM | POA: Insufficient documentation

## 2018-03-22 DIAGNOSIS — K219 Gastro-esophageal reflux disease without esophagitis: Secondary | ICD-10-CM | POA: Insufficient documentation

## 2018-03-22 DIAGNOSIS — F66 Other sexual disorders: Secondary | ICD-10-CM

## 2018-03-22 DIAGNOSIS — F528 Other sexual dysfunction not due to a substance or known physiological condition: Secondary | ICD-10-CM | POA: Insufficient documentation

## 2018-03-22 NOTE — Progress Notes (Signed)
Patient ID: Christina Byrd, female   DOB: [birth year], 53 y.o.   MRN: [id number]   Christina Byrd, is a 53 y.o. female  QMG:[id number]  TOI:[id number]  DOB - 1965-05-28  Subjective:  Chief Complaint and HPI: Christina Byrd is a 53 y.o. female here today with complaints of excessive desire for sex.  She says she feels this way throughout the day too.   She says this has been going on for about 6 months.  Denies galactorrhea, vision changes.  LMP about 3 years ago.  Not taking any supplements.     ROS:   Constitutional:  No f/c, No night sweats, No unexplained weight loss. EENT:  No vision changes, No blurry vision, No hearing changes. No mouth, throat, or ear problems.  Respiratory: No cough, No SOB Cardiac: No CP, no palpitations GI:  No abd pain, No N/V/D. GU: No Urinary s/sx Musculoskeletal: No joint pain Neuro: No headache, no dizziness, no motor weakness.  Skin: No rash Endocrine:  No polydipsia. No polyuria.  Psych: Denies SI/HI  No problems updated.  ALLERGIES: Allergies  Allergen Reactions  . Losartan Potassium     Rash  . Levaquin [Levofloxacin In D5w] Rash  . Ace Inhibitors Cough    REACTION: cough  . Codeine Nausea Only  . Cefepime Rash    09/04/12 pm Patient started to break out with small macules after IV Vanco infusion, then macules increased in size after starting cefepime infusion.  . Vancomycin Rash    09/04/12 pm Patient started to break out with small macules after IV Vanco infusion, then macules increased in size after starting cefepime infusion.    PAST MEDICAL HISTORY: Past Medical History:  Diagnosis Date  . GASTROESOPHAGEAL REFLUX, NO ESOPHAGITIS 04/21/2006   Qualifier: Diagnosis of  By: Benna Dunks    . GERD (gastroesophageal reflux disease) Dx 1995  . HTN (hypertension)     MEDICATIONS AT HOME: Prior to Admission medications   Medication Sig Start Date End Date Taking? Authorizing Provider  amLODipine (NORVASC) 10 MG tablet Take 1  tablet (10 mg total) by mouth daily. 01/27/18   Charlott Rakes, MD  aspirin EC 81 MG tablet Take 1 tablet (81 mg total) by mouth daily. 10/26/12   Rai, Vernelle Emerald, MD  atorvastatin (LIPITOR) 20 MG tablet Take 1 tablet (20 mg total) by mouth daily. 01/27/18   Charlott Rakes, MD  Blood Glucose Monitoring Suppl (TRUE METRIX METER) w/Device KIT Test blood sugar once daily 03/14/18   Elsie Stain, MD  cetirizine (ZYRTEC) 10 MG tablet Take 1 tablet (10 mg total) by mouth daily. 01/27/18   Charlott Rakes, MD  cloNIDine (CATAPRES) 0.1 MG tablet Take 1 tablet (0.1 mg total) by mouth at bedtime as needed. For hot flashes 01/27/18   Charlott Rakes, MD  diclofenac (VOLTAREN) 75 MG EC tablet Take 1 tablet (75 mg total) by mouth 2 (two) times daily. Patient taking differently: Take 75 mg by mouth 2 (two) times daily as needed.  01/03/18   Charlott Rakes, MD  glucose blood (TRUE METRIX BLOOD GLUCOSE TEST) test strip Use as instructed 03/14/18   Elsie Stain, MD  levETIRAcetam (KEPPRA) 500 MG tablet Take 1 tablet (500 mg total) by mouth 2 (two) times daily. 11/18/17   Cameron Sprang, MD  magnesium oxide (MAG-OX) 400 MG tablet Take 1 tablet (400 mg total) by mouth daily. 03/14/18   Elsie Stain, MD  metFORMIN (GLUCOPHAGE) 500 MG tablet Take 1 tablet (500 mg total) by  mouth daily with breakfast. 12/14/17   Charlott Rakes, MD  pantoprazole (PROTONIX) 20 MG tablet TAKE 1 TABLET BY MOUTH 2 TIMES DAILY BEFORE A MEAL 03/09/18   Charlott Rakes, MD  potassium chloride SA (K-DUR,KLOR-CON) 20 MEQ tablet Take 1 tablet (20 mEq total) by mouth 2 (two) times daily. 06/08/89   Delora Fuel, MD  TRUEPLUS LANCETS 28G MISC Test sugar daily with lancet 03/14/18   Elsie Stain, MD     Objective:  EXAM:   Vitals:   03/22/18 1502  BP: 134/88  Pulse: 97  Resp: 16  Temp: 98.8 F (37.1 C)  TempSrc: Oral  SpO2: 98%  Weight: 186 lb 6.4 oz (84.6 kg)    General appearance : A&OX3. NAD. Non-toxic-appearing HEENT:  Atraumatic and Normocephalic.  PERRLA. EOM intact.   Neck: supple, no JVD. No cervical lymphadenopathy. No thyromegaly Chest/Lungs:  Breathing-non-labored, Good air entry bilaterally, breath sounds normal without rales, rhonchi, or wheezing  CVS: S1 S2 regular, no murmurs, gallops, rubs  Extremities: Bilateral Lower Ext shows no edema, both legs are warm to touch with = pulse throughout Neurology:  CN II-XII grossly intact, Non focal.   Psych:  TP linear. J/I WNL. Normal speech. Appropriate eye contact and affect.  Skin:  No Rash  Data Review Lab Results  Component Value Date   HGBA1C 5.8 (H) 03/14/2018   HGBA1C 6.1 12/14/2017   HGBA1C 6.8 (H) 09/15/2017     Assessment & Plan   1. Excessive sexual drive - TSH - Testosterone,Free and Total - Prolactin - FSH/LH  2. New onset type 2 diabetes mellitus ([place]) Stable/controlled-continue current regimen-f/up as planned.  Blood sugar was 100 this morning.  She refuses to have her glucose checked today.   Patient have been counseled extensively about nutrition and exercise  Return for for March appt with Dr Margarita Rana.  The patient was given clear instructions to go to ER or return to medical center if symptoms don't improve, worsen or new problems develop. The patient verbalized understanding. The patient was told to call to get lab results if they haven't heard anything in the next week.     Freeman Caldron, PA-C [facility] [place], [place]   03/22/2018, 3:13 PM

## 2018-03-24 LAB — TESTOSTERONE,FREE AND TOTAL
TESTOSTERONE FREE: 1.4 pg/mL (ref 0.0–4.2)
Testosterone: 8 ng/dL (ref 3–41)

## 2018-03-24 LAB — FSH/LH
FSH: 108.7 m[IU]/mL
LH: 59.8 m[IU]/mL

## 2018-03-24 LAB — PROLACTIN: Prolactin: 9.4 ng/mL (ref 4.8–23.3)

## 2018-03-24 LAB — TSH: TSH: 1.58 u[IU]/mL (ref 0.450–4.500)

## 2018-03-27 ENCOUNTER — Telehealth: Payer: Self-pay

## 2018-03-27 NOTE — Telephone Encounter (Signed)
Contacted pt to go over lab results pt is aware   Pt states the allergies pills are not working. Pt states in the morning when she wakes up in the morning she has a bad cough she coughs up green phlegm and the coughing last for an hour. Pt is requesting something for cough sent to [facility]

## 2018-03-30 ENCOUNTER — Telehealth: Payer: Self-pay | Admitting: Family Medicine

## 2018-03-30 NOTE — Telephone Encounter (Signed)
Will route to PCP for review. 

## 2018-03-30 NOTE — Telephone Encounter (Signed)
Pt called stating she was constantly coughing up a lot of mucous and wanted to seek medical advice, I also stated to the pt to go to urgent care or ED if symptoms did not improve.

## 2018-03-31 NOTE — Telephone Encounter (Signed)
Patient was informed of PCP orders.

## 2018-03-31 NOTE — Telephone Encounter (Signed)
She can use Mucinex, increase fluid intake and use an analgesic if she has pain. If symptoms persist or worsen she will need to be seen.

## 2018-04-03 ENCOUNTER — Encounter: Payer: Self-pay | Admitting: Family Medicine

## 2018-04-03 ENCOUNTER — Ambulatory Visit: Payer: Self-pay | Attending: Family Medicine | Admitting: Family Medicine

## 2018-04-03 VITALS — BP 144/89 | HR 97 | Temp 98.6°F | Ht 61.0 in | Wt 183.0 lb

## 2018-04-03 DIAGNOSIS — R0982 Postnasal drip: Secondary | ICD-10-CM | POA: Insufficient documentation

## 2018-04-03 DIAGNOSIS — Z881 Allergy status to other antibiotic agents status: Secondary | ICD-10-CM | POA: Insufficient documentation

## 2018-04-03 DIAGNOSIS — K219 Gastro-esophageal reflux disease without esophagitis: Secondary | ICD-10-CM | POA: Insufficient documentation

## 2018-04-03 DIAGNOSIS — R05 Cough: Secondary | ICD-10-CM | POA: Insufficient documentation

## 2018-04-03 DIAGNOSIS — Z79899 Other long term (current) drug therapy: Secondary | ICD-10-CM | POA: Insufficient documentation

## 2018-04-03 DIAGNOSIS — Z8249 Family history of ischemic heart disease and other diseases of the circulatory system: Secondary | ICD-10-CM | POA: Insufficient documentation

## 2018-04-03 DIAGNOSIS — Z885 Allergy status to narcotic agent status: Secondary | ICD-10-CM | POA: Insufficient documentation

## 2018-04-03 DIAGNOSIS — Z7984 Long term (current) use of oral hypoglycemic drugs: Secondary | ICD-10-CM | POA: Insufficient documentation

## 2018-04-03 DIAGNOSIS — R059 Cough, unspecified: Secondary | ICD-10-CM

## 2018-04-03 DIAGNOSIS — Z791 Long term (current) use of non-steroidal anti-inflammatories (NSAID): Secondary | ICD-10-CM | POA: Insufficient documentation

## 2018-04-03 DIAGNOSIS — Z888 Allergy status to other drugs, medicaments and biological substances status: Secondary | ICD-10-CM | POA: Insufficient documentation

## 2018-04-03 DIAGNOSIS — I10 Essential (primary) hypertension: Secondary | ICD-10-CM | POA: Insufficient documentation

## 2018-04-03 DIAGNOSIS — Z7982 Long term (current) use of aspirin: Secondary | ICD-10-CM | POA: Insufficient documentation

## 2018-04-03 DIAGNOSIS — E119 Type 2 diabetes mellitus without complications: Secondary | ICD-10-CM | POA: Insufficient documentation

## 2018-04-03 LAB — GLUCOSE, POCT (MANUAL RESULT ENTRY): POC Glucose: 164 mg/dl — AB (ref 70–99)

## 2018-04-03 MED ORDER — FLUTICASONE PROPIONATE 50 MCG/ACT NA SUSP: 1.0000 | Freq: Every day | NASAL | 1 refills | Status: DC

## 2018-04-03 MED FILL — FLUTICASONE PROP 50 MCG SPR: 50 | 30 days supply | Qty: 16 | Fill #0

## 2018-04-03 NOTE — Progress Notes (Signed)
Subjective:  Patient ID: Christina Byrd, female    DOB: [birth year]  Age: 53 y.o. MRN: [id number]  CC: Cough   HPI Christina Byrd is a 53 year old female with a history of type 2 diabetes mellitus (A1c 5.8), GERD who presents today complaining of cough which she states commenced after she received the flu shot and pneumonia shots. On looking back in her chart she received 2 shots in 10/2017 and when that information was provided to her she then reported that symptoms had commenced ever since she was placed on Zyrtec but then she is unsure if the Zyrtec was just breaking up her cough. The cough is productive of yellowish-greenish mucus and she denies associated sinus pressure or pain, fever, myalgias, dyspnea, shortness of breath and endorses postnasal drip.  Past Medical History:  Diagnosis Date  . GASTROESOPHAGEAL REFLUX, NO ESOPHAGITIS 04/21/2006   Qualifier: Diagnosis of  By: Benna Dunks    . GERD (gastroesophageal reflux disease) Dx 1995  . HTN (hypertension)     History reviewed. No pertinent surgical history.  Family History  Problem Relation Age of Onset  . CAD Mother     Allergies  Allergen Reactions  . Losartan Potassium     Rash  . Levaquin [Levofloxacin In D5w] Rash  . Ace Inhibitors Cough    REACTION: cough  . Codeine Nausea Only  . Cefepime Rash    09/04/12 pm Patient started to break out with small macules after IV Vanco infusion, then macules increased in size after starting cefepime infusion.  . Vancomycin Rash    09/04/12 pm Patient started to break out with small macules after IV Vanco infusion, then macules increased in size after starting cefepime infusion.    Outpatient Medications Prior to Visit  Medication Sig Dispense Refill  . amLODipine (NORVASC) 10 MG tablet Take 1 tablet (10 mg total) by mouth daily. 90 tablet 1  . aspirin EC 81 MG tablet Take 1 tablet (81 mg total) by mouth daily.    Marland Kitchen atorvastatin (LIPITOR) 20 MG tablet Take 1 tablet (20  mg total) by mouth daily. 90 tablet 1  . Blood Glucose Monitoring Suppl (TRUE METRIX METER) w/Device KIT Test blood sugar once daily 1 kit 0  . cetirizine (ZYRTEC) 10 MG tablet Take 1 tablet (10 mg total) by mouth daily. 30 tablet 1  . cloNIDine (CATAPRES) 0.1 MG tablet Take 1 tablet (0.1 mg total) by mouth at bedtime as needed. For hot flashes 90 tablet 1  . diclofenac (VOLTAREN) 75 MG EC tablet Take 1 tablet (75 mg total) by mouth 2 (two) times daily. (Patient taking differently: Take 75 mg by mouth 2 (two) times daily as needed. ) 60 tablet 1  . glucose blood (TRUE METRIX BLOOD GLUCOSE TEST) test strip Use as instructed 100 each 2  . levETIRAcetam (KEPPRA) 500 MG tablet Take 1 tablet (500 mg total) by mouth 2 (two) times daily. 180 tablet 3  . magnesium oxide (MAG-OX) 400 MG tablet Take 1 tablet (400 mg total) by mouth daily. 90 tablet 3  . metFORMIN (GLUCOPHAGE) 500 MG tablet Take 1 tablet (500 mg total) by mouth daily with breakfast. 90 tablet 1  . pantoprazole (PROTONIX) 20 MG tablet TAKE 1 TABLET BY MOUTH 2 TIMES DAILY BEFORE A MEAL 60 tablet 2  . potassium chloride SA (K-DUR,KLOR-CON) 20 MEQ tablet Take 1 tablet (20 mEq total) by mouth 2 (two) times daily. 20 tablet 0  . TRUEPLUS LANCETS 28G MISC Test sugar daily  with lancet 100 each 2   No facility-administered medications prior to visit.      ROS Review of Systems General: negative for fever, weight loss, appetite change Eyes: no visual symptoms. ENT: no ear symptoms, no sinus tenderness, no nasal congestion, +post nasal drip, +sore throat. Neck: no pain  Respiratory: no wheezing, shortness of breath, +cough Cardiovascular: no chest pain, no dyspnea on exertion, no pedal edema, no orthopnea. Gastrointestinal: no abdominal pain, no diarrhea, no constipation Genito-Urinary: no urinary frequency, no dysuria, no polyuria. Hematologic: no bruising Endocrine: no cold or heat intolerance Neurological: no headaches, no seizures, no  tremors Musculoskeletal: no joint pains, no joint swelling Skin: no pruritus, no rash. Psychological: no depression, no anxiety,    Objective:  BP (!) 144/89   Pulse 97   Temp 98.6 F (37 C) (Oral)   Ht _0  (1.549 m)   Wt 183 lb (83 kg)   LMP 12/17/2014   SpO2 98%   BMI 34.58 kg/m   BP/Weight 04/03/2018 03/22/2018 8/50/2774  Systolic BP 128 786 767  Diastolic BP 89 88 76  Wt. (Lbs) 183 186.4 183  BMI 34.58 35.22 34.58      Physical Exam  Constitutional: normal appearing,  Eyes: PERRLA HEENT: Head is atraumatic, normal sinuses, normal oropharynx, normal appearing tonsils and palate, tympanic membrane is normal bilaterally. Neck: normal range of motion, no thyromegaly, no JVD Cardiovascular: normal rate and rhythm, normal heart sounds, no murmurs, rub or gallop, no pedal edema Respiratory: Normal breath sounds, clear to auscultation bilaterally, no wheezes, no rales, no rhonchi Abdomen: soft, not tender to palpation, normal bowel sounds, no enlarged organs Musculoskeletal: Full ROM, no tenderness in joints   CMP Latest Ref Rng & Units 11/10/2017 09/15/2017 09/06/2017  Glucose 70 - 99 mg/dL 126(H) 123(H) -  BUN 6 - 20 mg/dL 15 11 -  Creatinine 0.44 - 1.00 mg/dL 0.93 0.97 -  Sodium 135 - 145 mmol/L 140 143 -  Potassium 3.5 - 5.1 mmol/L 4.0 5.0 -  Chloride 98 - 111 mmol/L 104 103 -  CO2 22 - 32 mmol/L 23 19(L) -  Calcium 8.9 - 10.3 mg/dL 9.0 9.6 -  Total Protein 6.5 - 8.1 g/dL 7.4 - 7.0  Total Bilirubin 0.3 - 1.2 mg/dL 0.8 - 0.7  Alkaline Phos 38 - 126 U/L 86 - 76  AST 15 - 41 U/L 20 - 20  ALT 0 - 44 U/L 16 - 14    Lipid Panel     Component Value Date/Time   CHOL 161 06/22/2017 1041   TRIG 253 (H) 06/22/2017 1041   HDL 44 06/22/2017 1041   CHOLHDL 3.7 06/22/2017 1041   CHOLHDL 2.4 10/26/2012 1052   VLDL 46 (H) 10/26/2012 1052   LDLCALC 66 06/22/2017 1041   LDLDIRECT 153 (H) 01/30/2008 2146    CBC    Component Value Date/Time   WBC 8.7 11/10/2017 1144    RBC 4.36 11/10/2017 1144   HGB 12.8 11/10/2017 1144   HCT 39.6 11/10/2017 1144   PLT 197 11/10/2017 1144   MCV 90.8 11/10/2017 1144   MCH 29.4 11/10/2017 1144   MCHC 32.3 11/10/2017 1144   RDW 15.9 (H) 11/10/2017 1144   LYMPHSABS 1.2 11/10/2017 1144   MONOABS 0.5 11/10/2017 1144   EOSABS 0.1 11/10/2017 1144   BASOSABS 0.1 11/10/2017 1144    Lab Results  Component Value Date   HGBA1C 5.8 (H) 03/14/2018    Assessment & Plan:   1. Cough Likely from  postnasal drip Continue Zyrtec  2. Post-nasal drip - fluticasone (FLONASE) 50 MCG/ACT nasal spray; Place 1 spray into both nostrils daily.  Dispense: 16 g; Refill: 1  3. Type 2 diabetes mellitus without complication, without long-term current use of insulin (HCC) Continue current management - POCT glucose (manual entry)   Meds ordered this encounter  Medications  . fluticasone (FLONASE) 50 MCG/ACT nasal spray    Sig: Place 1 spray into both nostrils daily.    Dispense:  16 g    Refill:  1    Follow-up: Return for follow up of chronic medical conditions, keep previously scheduled appointment.       Charlott Rakes, MD, FAAFP. [facility] and [place], [place]   04/03/2018, 11:41 AM

## 2018-04-03 NOTE — Patient Instructions (Signed)
Postnasal Drip  Postnasal drip is the feeling of mucus going down the back of your throat. Mucus is a slimy substance that moistens and cleans your nose and throat, as well as the air pockets in face bones near your forehead and cheeks (sinuses). Small amounts of mucus pass from your nose and sinuses down the back of your throat all the time. This is normal. When you produce too much mucus or the mucus gets too thick, you can feel it.  Some common causes of postnasal drip include:   Having more mucus because of:  ? A cold or the flu.  ? Allergies.  ? Cold air.  ? Certain medicines.   Having more mucus that is thicker because of:  ? A sinus or nasal infection.  ? Dry air.  ? A food allergy.  Follow these instructions at home:  Relieving discomfort     Gargle with a salt-water mixture 3-4 times a day or as needed. To make a salt-water mixture, completely dissolve -1 tsp of salt in 1 cup of warm water.   If the air in your home is dry, use a humidifier to add moisture to the air.   Use a saline spray or container (neti pot) to flush out the nose (nasal irrigation). These methods can help clear away mucus and keep the nasal passages moist.  General instructions   Take over-the-counter and prescription medicines only as told by your health care provider.   Follow instructions from your health care provider about eating or drinking restrictions. You may need to avoid caffeine.   Avoid things that you know you are allergic to (allergens), like dust, mold, pollen, pets, or certain foods.   Drink enough fluid to keep your urine pale yellow.   Keep all follow-up visits as told by your health care provider. This is important.  Contact a health care provider if:   You have a fever.   You have a sore throat.   You have difficulty swallowing.   You have headache.   You have sinus pain.   You have a cough that does not go away.   The mucus from your nose becomes thick and is green or yellow in color.   You have  cold or flu symptoms that last more than 10 days.  Summary   Postnasal drip is the feeling of mucus going down the back of your throat.   If your health care provider approves, use nasal irrigation or a nasal spray 2?4 times a day.   Avoid things that you know you are allergic to (allergens), like dust, mold, pollen, pets, or certain foods.  This information is not intended to replace advice given to you by your health care provider. Make sure you discuss any questions you have with your health care provider.  Document Released: 05/24/2016 Document Revised: 05/24/2016 Document Reviewed: 05/24/2016  Elsevier Interactive Patient Education  2019 [employer]

## 2018-04-04 MED FILL — AMLODIPINE BESYLATE 10 MG T: 10 | 30 days supply | Qty: 30 | Fill #2

## 2018-04-04 MED FILL — ?METFORMIN HCL 500MG TABL: 500 | 30 days supply | Qty: 60 | Fill #4

## 2018-04-04 MED FILL — DICLOFENAC SOD EC 75 MG TAB: 75 | 30 days supply | Qty: 60 | Fill #1

## 2018-04-04 MED FILL — ?ATORVASTATIN 20 MG TABLET: 20 | 30 days supply | Qty: 30 | Fill #3

## 2018-04-04 MED FILL — PANTOPRAZOLE SOD DR 20 MG T: 20 | 30 days supply | Qty: 60 | Fill #1

## 2018-04-04 MED FILL — ?CLONIDINE HCL 0.1 MG TABL: 0.1 | 30 days supply | Qty: 30 | Fill #2

## 2018-04-04 MED FILL — ?CETIRIZINE HCL 10 MG TABLE: 10 | 30 days supply | Qty: 30 | Fill #1

## 2018-04-04 MED FILL — levETIRAcetam 500 MG TABS: 500 | 30 days supply | Qty: 60 | Fill #2

## 2018-04-10 MED FILL — MAGNESIUM OXIDE 400 MG TAB: 400 | 30 days supply | Qty: 30 | Fill #1

## 2018-05-01 ENCOUNTER — Ambulatory Visit: Payer: Self-pay | Attending: Family Medicine | Admitting: Family Medicine

## 2018-05-01 ENCOUNTER — Other Ambulatory Visit: Payer: Self-pay | Admitting: Family Medicine

## 2018-05-01 ENCOUNTER — Encounter: Payer: Self-pay | Admitting: Family Medicine

## 2018-05-01 VITALS — BP 101/73 | HR 111 | Temp 98.4°F | Ht 61.0 in | Wt 183.0 lb

## 2018-05-01 DIAGNOSIS — E119 Type 2 diabetes mellitus without complications: Secondary | ICD-10-CM

## 2018-05-01 DIAGNOSIS — G629 Polyneuropathy, unspecified: Secondary | ICD-10-CM

## 2018-05-01 LAB — GLUCOSE, POCT (MANUAL RESULT ENTRY): POC Glucose: 150 mg/dl — AB (ref 70–99)

## 2018-05-01 MED ORDER — GABAPENTIN 300 MG PO CAPS: 300.0000 mg | ORAL_CAPSULE | Freq: Every day | ORAL | 3 refills | Status: DC

## 2018-05-01 MED ORDER — METFORMIN HCL 500 MG PO TABS: 500.0000 mg | ORAL_TABLET | Freq: Every day | ORAL | 1 refills | Status: DC

## 2018-05-01 MED FILL — metFORMIN HCL 500 MG TABS: 500 | 30 days supply | Qty: 60 | Fill #5

## 2018-05-01 MED FILL — ATORVASTATIN 20 MG TABLET: 20 | 30 days supply | Qty: 30 | Fill #0

## 2018-05-01 MED FILL — cloNIDine HCL 0.1 MG TABS: 0.1 | 30 days supply | Qty: 30 | Fill #3

## 2018-05-01 MED FILL — DICLOFENAC SOD EC 75 MG TAB: 75 | 30 days supply | Qty: 60 | Fill #0

## 2018-05-01 MED FILL — GABAPENTIN 300 MG CAPSULE: 300 | 30 days supply | Qty: 30 | Fill #0

## 2018-05-01 MED FILL — PANTOPRAZOLE SOD DR 20 MG T: 20 | 30 days supply | Qty: 60 | Fill #2

## 2018-05-01 MED FILL — AMLODIPINE BESYLATE 10 MG T: 10 | 30 days supply | Qty: 30 | Fill #3

## 2018-05-01 MED FILL — levETIRAcetam 500 MG TABS: 500 | 30 days supply | Qty: 60 | Fill #3

## 2018-05-01 NOTE — Progress Notes (Signed)
Patient is having pain in hands and legs.

## 2018-05-01 NOTE — Progress Notes (Signed)
Subjective:  Patient ID: Christina Byrd, female    DOB: 05/14/1965  Age: 53 y.o. MRN: [id number]  CC: Diabetes   HPI PAIZLEIGH WILDS  is a 53 year old female with a history of type 2 diabetes mellitus (A1c 5.8), GERD, Seizures who presents today for a follow-up visit. She complains of numbness in both lower extremities which occur at night and are related to days when she has done a lot of walking at her place of work.  Symptoms do not occur every day and are most noticeable when she wakes up at night to use the bathroom but then resolves as walking progresses.  Denies presence of pain. With regards to her diabetes mellitus she denies hypoglycemia, visual concerns and is compliant with metformin. With regards to her seizures she has not had any lately. She will be transferring care to another physician due to her [facility] requirements.  Past Medical History:  Diagnosis Date  . GASTROESOPHAGEAL REFLUX, NO ESOPHAGITIS 04/21/2006   Qualifier: Diagnosis of  By: Benna Dunks    . GERD (gastroesophageal reflux disease) Dx 1995  . HTN (hypertension)     History reviewed. No pertinent surgical history.  Family History  Problem Relation Age of Onset  . CAD Mother     Allergies  Allergen Reactions  . Losartan Potassium     Rash  . Levaquin [Levofloxacin In D5w] Rash  . Ace Inhibitors Cough    REACTION: cough  . Codeine Nausea Only  . Cefepime Rash    09/04/12 pm Patient started to break out with small macules after IV Vanco infusion, then macules increased in size after starting cefepime infusion.  . Vancomycin Rash    09/04/12 pm Patient started to break out with small macules after IV Vanco infusion, then macules increased in size after starting cefepime infusion.    Outpatient Medications Prior to Visit  Medication Sig Dispense Refill  . amLODipine (NORVASC) 10 MG tablet Take 1 tablet (10 mg total) by mouth daily. 90 tablet 1  . aspirin EC 81 MG tablet Take 1  tablet (81 mg total) by mouth daily.    Marland Kitchen atorvastatin (LIPITOR) 20 MG tablet Take 1 tablet (20 mg total) by mouth daily. 90 tablet 1  . Blood Glucose Monitoring Suppl (TRUE METRIX METER) w/Device KIT Test blood sugar once daily 1 kit 0  . cetirizine (ZYRTEC) 10 MG tablet Take 1 tablet (10 mg total) by mouth daily. 30 tablet 1  . cloNIDine (CATAPRES) 0.1 MG tablet Take 1 tablet (0.1 mg total) by mouth at bedtime as needed. For hot flashes 90 tablet 1  . diclofenac (VOLTAREN) 75 MG EC tablet TAKE 1 TABLET (75 MG TOTAL) BY MOUTH 2 (TWO) TIMES DAILY. 60 tablet 1  . fluticasone (FLONASE) 50 MCG/ACT nasal spray Place 1 spray into both nostrils daily. 16 g 1  . glucose blood (TRUE METRIX BLOOD GLUCOSE TEST) test strip Use as instructed 100 each 2  . levETIRAcetam (KEPPRA) 500 MG tablet Take 1 tablet (500 mg total) by mouth 2 (two) times daily. 180 tablet 3  . magnesium oxide (MAG-OX) 400 MG tablet Take 1 tablet (400 mg total) by mouth daily. 90 tablet 3  . pantoprazole (PROTONIX) 20 MG tablet TAKE 1 TABLET BY MOUTH 2 TIMES DAILY BEFORE A MEAL 60 tablet 2  . potassium chloride SA (K-DUR,KLOR-CON) 20 MEQ tablet Take 1 tablet (20 mEq total) by mouth 2 (two) times daily. 20 tablet 0  . TRUEPLUS LANCETS 28G  MISC Test sugar daily with lancet 100 each 2  . metFORMIN (GLUCOPHAGE) 500 MG tablet Take 1 tablet (500 mg total) by mouth daily with breakfast. 90 tablet 1   No facility-administered medications prior to visit.      ROS Review of Systems  Constitutional: Negative for activity change, appetite change and fatigue.  HENT: Negative for congestion, sinus pressure and sore throat.   Eyes: Negative for visual disturbance.  Respiratory: Negative for cough, chest tightness, shortness of breath and wheezing.   Cardiovascular: Negative for chest pain and palpitations.  Gastrointestinal: Negative for abdominal distention, abdominal pain and constipation.  Endocrine: Negative for polydipsia.  Genitourinary:  Negative for dysuria and frequency.  Musculoskeletal: Negative for arthralgias and back pain.  Skin: Negative for rash.  Neurological: Positive for numbness. Negative for tremors and light-headedness.  Hematological: Does not bruise/bleed easily.  Psychiatric/Behavioral: Negative for agitation and behavioral problems.    Objective:  BP 101/73   Pulse (!) 111   Temp 98.4 F (36.9 C) (Oral)   Ht 5' 1" (1.549 m)   Wt 183 lb (83 kg)   LMP 12/17/2014   SpO2 96%   BMI 34.58 kg/m   BP/Weight 05/01/2018 04/03/2018 9/51/8841  Systolic BP 660 630 160  Diastolic BP 73 89 88  Wt. (Lbs) 183 183 186.4  BMI 34.58 34.58 35.22      Physical Exam Constitutional:      Appearance: She is well-developed.  Cardiovascular:     Rate and Rhythm: Tachycardia present.     Heart sounds: Normal heart sounds. No murmur.  Pulmonary:     Effort: Pulmonary effort is normal.     Breath sounds: Normal breath sounds. No wheezing or rales.  Chest:     Chest wall: No tenderness.  Abdominal:     General: Bowel sounds are normal. There is no distension.     Palpations: Abdomen is soft. There is no mass.     Tenderness: There is no abdominal tenderness.  Musculoskeletal: Normal range of motion.  Neurological:     Mental Status: She is alert and oriented to person, place, and time.     CMP Latest Ref Rng & Units 11/10/2017 09/15/2017 09/06/2017  Glucose 70 - 99 mg/dL 126(H) 123(H) -  BUN 6 - 20 mg/dL 15 11 -  Creatinine 0.44 - 1.00 mg/dL 0.93 0.97 -  Sodium 135 - 145 mmol/L 140 143 -  Potassium 3.5 - 5.1 mmol/L 4.0 5.0 -  Chloride 98 - 111 mmol/L 104 103 -  CO2 22 - 32 mmol/L 23 19(L) -  Calcium 8.9 - 10.3 mg/dL 9.0 9.6 -  Total Protein 6.5 - 8.1 g/dL 7.4 - 7.0  Total Bilirubin 0.3 - 1.2 mg/dL 0.8 - 0.7  Alkaline Phos 38 - 126 U/L 86 - 76  AST 15 - 41 U/L 20 - 20  ALT 0 - 44 U/L 16 - 14    Lipid Panel     Component Value Date/Time   CHOL 161 06/22/2017 1041   TRIG 253 (H) 06/22/2017 1041   HDL  44 06/22/2017 1041   CHOLHDL 3.7 06/22/2017 1041   CHOLHDL 2.4 10/26/2012 1052   VLDL 46 (H) 10/26/2012 1052   LDLCALC 66 06/22/2017 1041   LDLDIRECT 153 (H) 01/30/2008 2146    CBC    Component Value Date/Time   WBC 8.7 11/10/2017 1144   RBC 4.36 11/10/2017 1144   HGB 12.8 11/10/2017 1144   HCT 39.6 11/10/2017 1144   PLT 197  11/10/2017 1144   MCV 90.8 11/10/2017 1144   MCH 29.4 11/10/2017 1144   MCHC 32.3 11/10/2017 1144   RDW 15.9 (H) 11/10/2017 1144   LYMPHSABS 1.2 11/10/2017 1144   MONOABS 0.5 11/10/2017 1144   EOSABS 0.1 11/10/2017 1144   BASOSABS 0.1 11/10/2017 1144    Lab Results  Component Value Date   HGBA1C 5.8 (H) 03/14/2018    Assessment & Plan:   1. Controlled type 2 diabetes mellitus without complication, without long-term current use of insulin (HCC) Controlled with A1c of 5.8 - POCT glucose (manual entry) - Basic Metabolic Panel - metFORMIN (GLUCOPHAGE) 500 MG tablet; Take 1 tablet (500 mg total) by mouth daily with breakfast.  Dispense: 90 tablet; Refill: 1  2. Neuropathy Symptoms are mild Could be secondary to her metformin use-we will screen for B12 deficiency associated with metformin Commence gabapentin - Vitamin B12 - gabapentin (NEURONTIN) 300 MG capsule; Take 1 capsule (300 mg total) by mouth at bedtime.  Dispense: 30 capsule; Refill: 3   Meds ordered this encounter  Medications  . gabapentin (NEURONTIN) 300 MG capsule    Sig: Take 1 capsule (300 mg total) by mouth at bedtime.    Dispense:  30 capsule    Refill:  3  . metFORMIN (GLUCOPHAGE) 500 MG tablet    Sig: Take 1 tablet (500 mg total) by mouth daily with breakfast.    Dispense:  90 tablet    Refill:  1    Follow-up: Return for [place] up of chronic medical conditions, she will be establishing care with a different PCP.       Charlott Rakes, MD, FAAFP. [facility] and [place], [employer]   05/01/2018, 3:08 PM

## 2018-05-02 LAB — BASIC METABOLIC PANEL
BUN / CREAT RATIO: 14 (ref 9–23)
BUN: 13 mg/dL (ref 6–24)
CO2: 17 mmol/L — ABNORMAL LOW (ref 20–29)
Calcium: 9 mg/dL (ref 8.7–10.2)
Chloride: 104 mmol/L (ref 96–106)
Creatinine, Ser: 0.93 mg/dL (ref 0.57–1.00)
GFR calc Af Amer: 82 mL/min/{1.73_m2} (ref >59)
GFR calc non Af Amer: 71 mL/min/{1.73_m2} (ref >59)
GLUCOSE: 158 mg/dL — AB (ref 65–99)
POTASSIUM: 3.9 mmol/L (ref 3.5–5.2)
SODIUM: 145 mmol/L — AB (ref 134–144)

## 2018-05-02 LAB — VITAMIN B12: VITAMIN B 12: 283 pg/mL (ref 232–1245)

## 2018-05-05 ENCOUNTER — Telehealth: Payer: Self-pay

## 2018-05-05 NOTE — Telephone Encounter (Signed)
-----   Message from Hoy Register, MD sent at 05/02/2018  8:26 AM EDT ----- Please inform the patient that labs are normal. Thank you.

## 2018-05-05 NOTE — Telephone Encounter (Signed)
Patient was called and informed of lab results. Patient had no questions.  

## 2018-05-08 IMAGING — MR MR HEAD W/O CM
7 of 8 series · 39 of 48 positions shown · non-contrast
Comparison: Head CT 07/08/2016

CLINICAL DATA: New onset seizure

EXAM:
MRI HEAD WITHOUT CONTRAST
TECHNIQUE: Multiplanar, multiecho pulse sequences of the brain and surrounding
structures were obtained without intravenous contrast.

[Series 2: FLAIR · sagittal · 5.0mm · 0.47mm/px · 4 of 23 slices shown (1 of 2)]
[im 1/23]
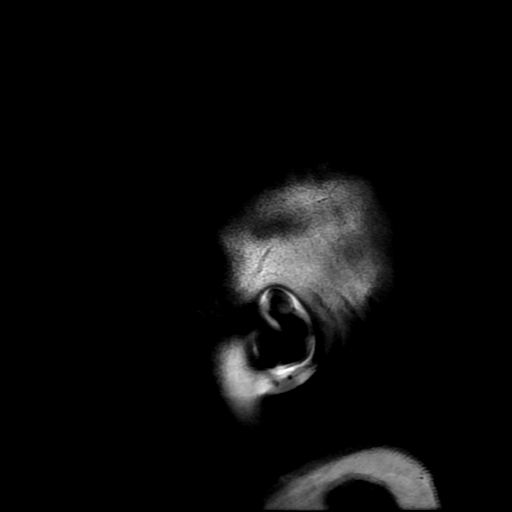
[im 8/23]
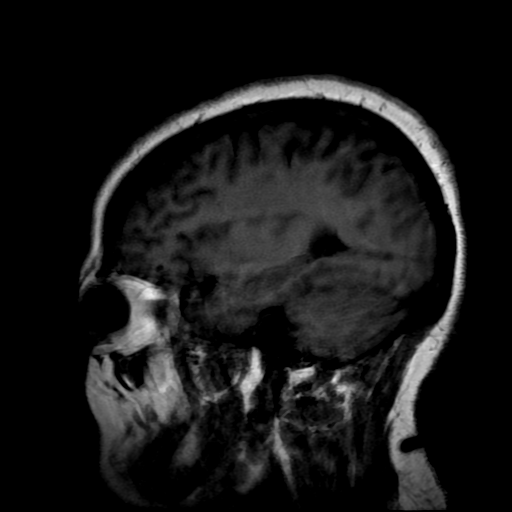
[im 15/23]
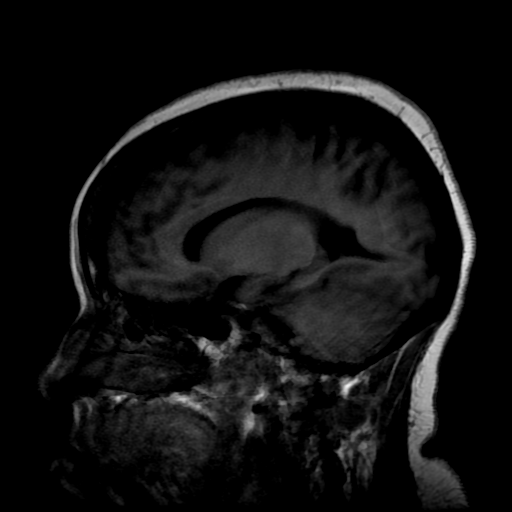
[im 23/23]
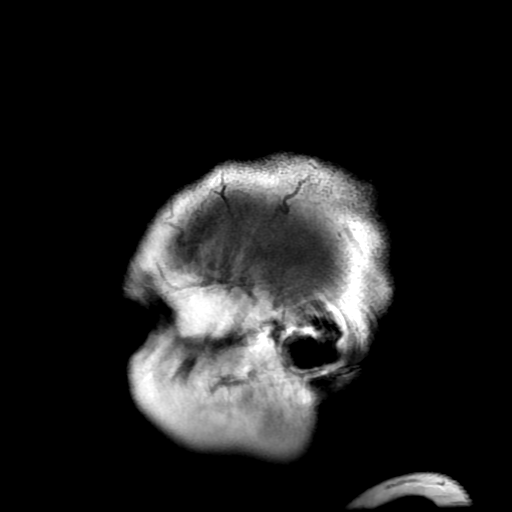

[Series 6: T2 · axial · 5.0mm · 0.43mm/px · z∈[-61,+82]mm · 3 of 25 slices shown]
[im 1/25]
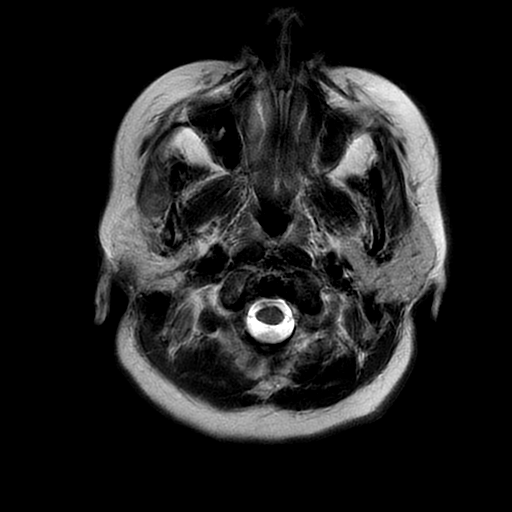
[im 13/25]
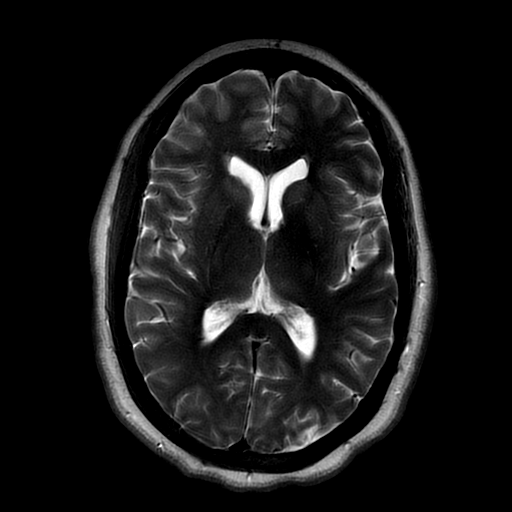
[im 25/25]
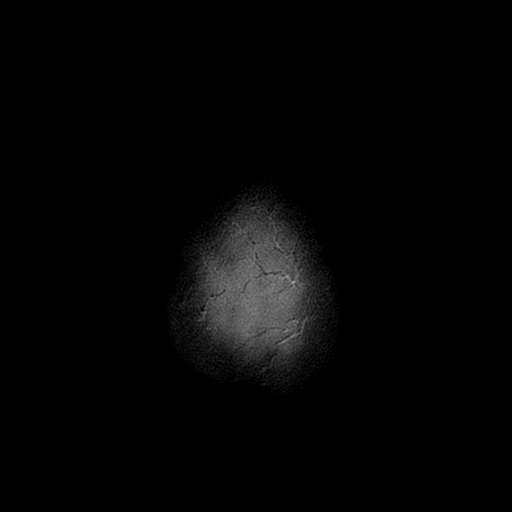

[Series 7: DWI · axial · 3.0mm · 0.94mm/px · z∈[-64,+81]mm · 9 of 100 slices shown (1 of 2)]
[im 1/100]
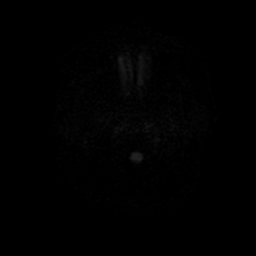
[im 17/100]
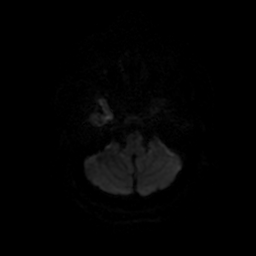
[im 34/100]
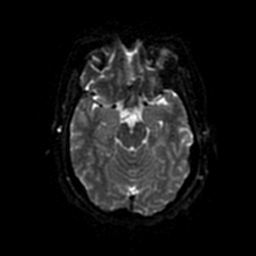
[im 42/100]
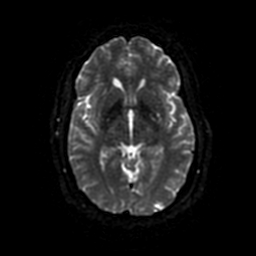
[im 50/100]
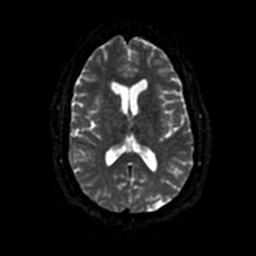
[im 58/100]
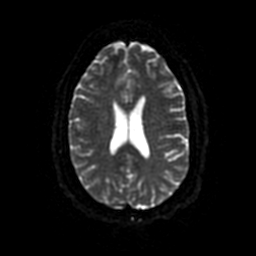
[im 67/100]
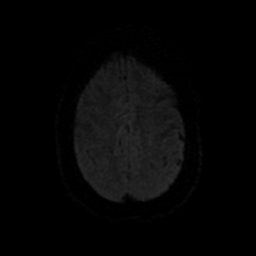
[im 83/100]
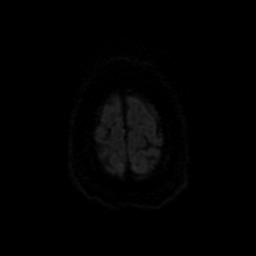
[im 100/100]
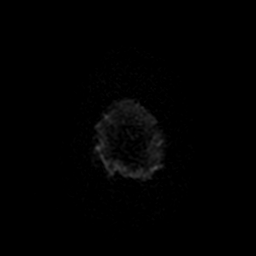

[Series 10: FLAIR · axial · 5.0mm · 0.43mm/px · z∈[-73,+69]mm · 3 of 25 slices shown (2 of 2)]
[im 1/25]
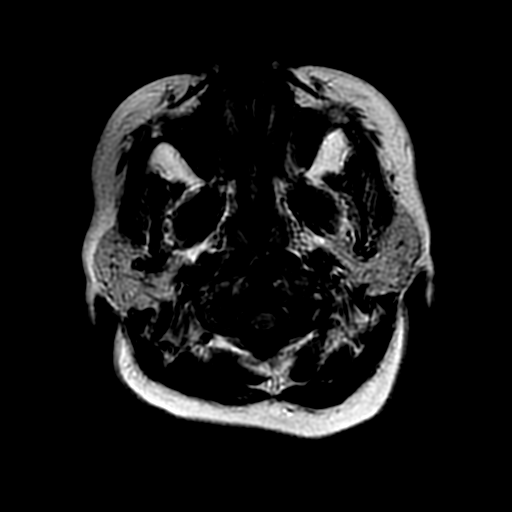
[im 13/25]
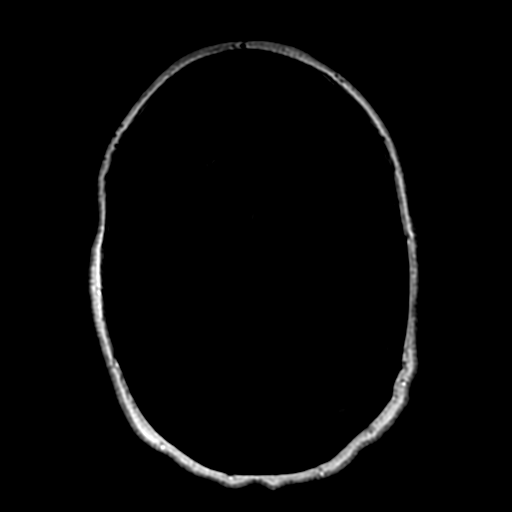
[im 25/25]
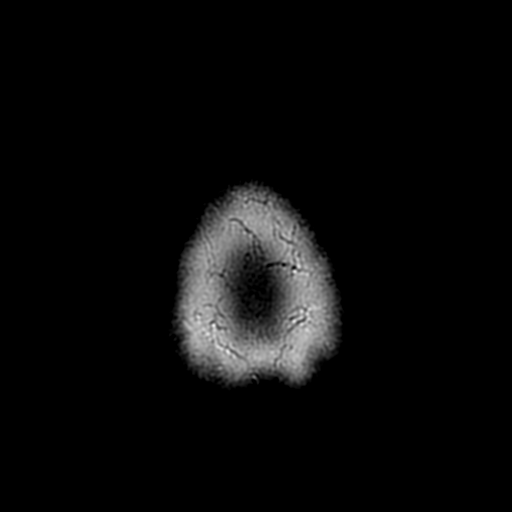

[Series 11: DWI · coronal · 4.0mm · 0.94mm/px · 8 of 72 slices shown (2 of 2)]
[im 1/72]
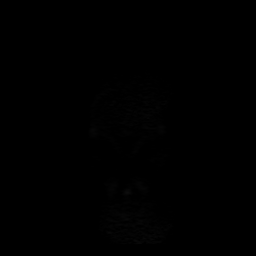
[im 8/72]
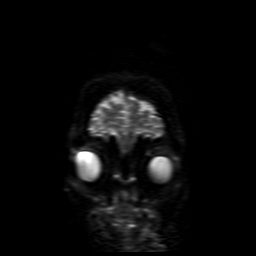
[im 24/72]
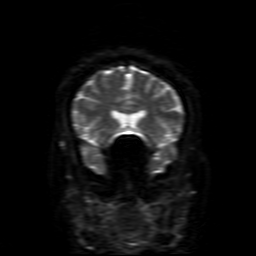
[im 32/72]
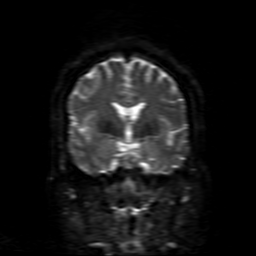
[im 40/72]
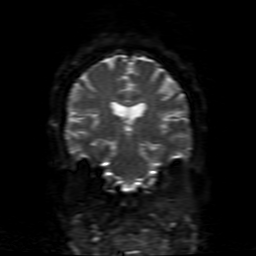
[im 48/72]
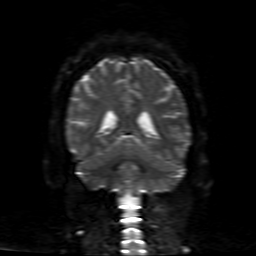
[im 64/72]
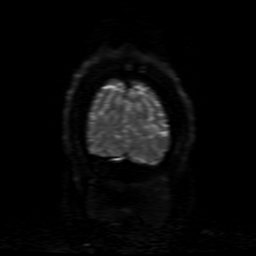
[im 72/72]
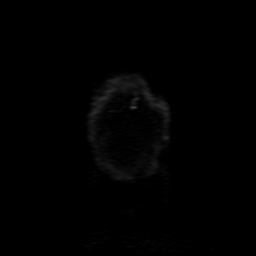

[Series 750: ADC · axial · 3.0mm · 0.94mm/px · z∈[-64,+81]mm · 7 of 50 slices shown (1 of 2)]
[im 1/50]
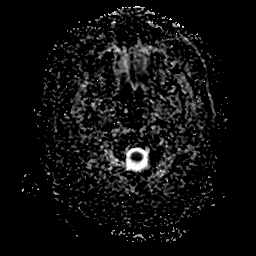
[im 9/50]
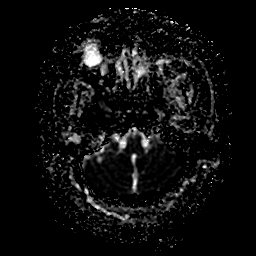
[im 17/50]
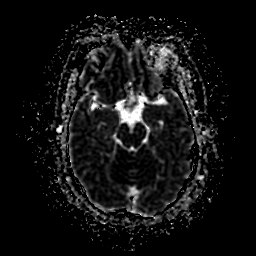
[im 25/50]
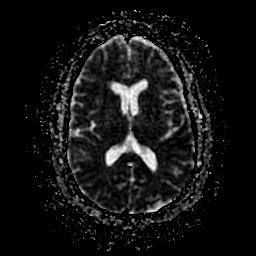
[im 33/50]
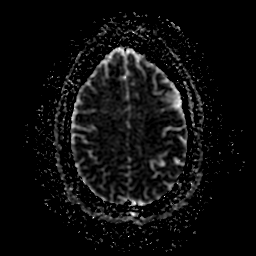
[im 41/50]
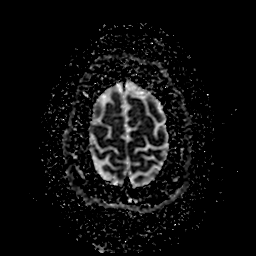
[im 50/50]
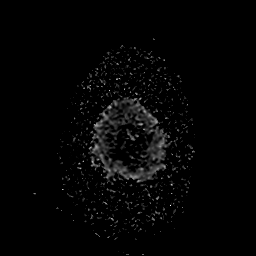

[Series 1150: ADC · coronal · 4.0mm · 0.94mm/px · 5 of 36 slices shown (2 of 2)]
[im 1/36]
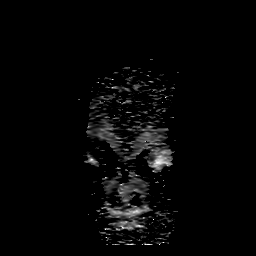
[im 9/36]
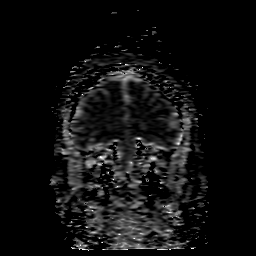
[im 18/36]
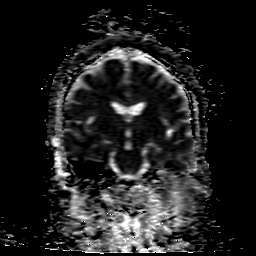
[im 27/36]
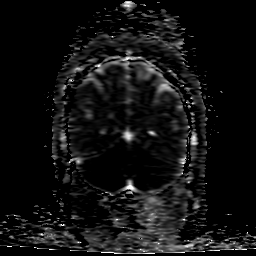
[im 36/36]
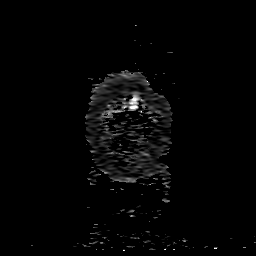

[39 of 48 positions shown; findings below may reference images not displayed]

FINDINGS: The examination had to be discontinued prior to completion due to
patient refusal to continue. Postcontrast imaging and precontrast
axial T1 weighted imaging were not obtained.

Brain: The midline structures are normal. There is no focal
diffusion restriction to indicate acute infarct. There is an old
left frontal lobe infarct. Scattered foci of white matter
hyperintensity. No intraparenchymal hematoma or chronic
microhemorrhage. Brain volume is normal for age without age-advanced
or lobar predominant atrophy. The dura is normal and there is no
extra-axial collection.

Vascular: Major intracranial arterial and venous sinus flow voids
are preserved.

Skull and upper cervical spine: The visualized skull base,
calvarium, upper cervical spine and extracranial soft tissues are
normal.

Sinuses/Orbits: No fluid levels or advanced mucosal thickening. No
mastoid effusion. Normal orbits.
IMPRESSION: 1. Truncated examination.
2. No acute intracranial abnormality.
3. Old left frontal lobe infarct.

## 2018-05-10 ENCOUNTER — Ambulatory Visit: Payer: Self-pay | Attending: Family Medicine | Admitting: Pharmacist

## 2018-05-10 ENCOUNTER — Other Ambulatory Visit: Payer: Self-pay

## 2018-05-10 ENCOUNTER — Encounter: Payer: Self-pay | Admitting: Pharmacist

## 2018-05-10 DIAGNOSIS — Z79899 Other long term (current) drug therapy: Secondary | ICD-10-CM

## 2018-05-10 NOTE — Patient Instructions (Signed)
Test your blood sugar every day, first thing in the morning before eating. Your goal is 80 - 130.   You can test your blood sugar after eating, but you must wait 2 hours after you finish eating before checking you blood sugar. Your goal is to be less than 180.   Leave the lancing device, "finger pricker", on #3.

## 2018-05-10 NOTE — Progress Notes (Signed)
Patient was educated on the use of the True Metrix blood glucose meter. Reviewed necessary supplies and operation of the meter. Also reviewed goal blood glucose levels. Patient was able to demonstrate use. All questions and concerns were addressed.   

## 2018-05-26 ENCOUNTER — Other Ambulatory Visit: Payer: Self-pay | Admitting: Family Medicine

## 2018-05-26 DIAGNOSIS — K219 Gastro-esophageal reflux disease without esophagitis: Secondary | ICD-10-CM

## 2018-05-26 MED FILL — levETIRAcetam 500 MG TABS: 500 | 30 days supply | Qty: 60 | Fill #4

## 2018-05-26 MED FILL — DICLOFENAC SOD EC 75 MG TAB: 75 | 30 days supply | Qty: 60 | Fill #1

## 2018-05-26 MED FILL — ATORVASTATIN 20 MG TABLET: 20 | 30 days supply | Qty: 30 | Fill #1

## 2018-05-26 MED FILL — cloNIDine HCL 0.1 MG TABS: 0.1 | 30 days supply | Qty: 30 | Fill #0

## 2018-05-26 MED FILL — AMLODIPINE BESYLATE 10 MG T: 10 | 30 days supply | Qty: 30 | Fill #4

## 2018-05-26 MED FILL — metFORMIN HCL 500 MG TABS: 500 | 30 days supply | Qty: 60 | Fill #6

## 2018-05-27 MED FILL — PANTOPRAZOLE SOD DR 20 MG T: 20 | 30 days supply | Qty: 60 | Fill #0

## 2018-05-31 ENCOUNTER — Telehealth: Payer: Self-pay | Admitting: Family Medicine

## 2018-05-31 NOTE — Telephone Encounter (Signed)
Pt called in wanting to know if she could have something prescribed for her ear infection that came back pt also asked if her nurse could give a call back did not state reason please follow up

## 2018-06-01 MED ORDER — CARBAMIDE PEROXIDE 6.5 % OT SOLN: 3.0000 [drp] | Freq: Two times a day (BID) | OTIC | 0 refills | Status: DC

## 2018-06-01 NOTE — Telephone Encounter (Signed)
Patient is having itching and pain in ear.

## 2018-06-01 NOTE — Telephone Encounter (Signed)
Debrox sent to pharmacy.

## 2018-06-09 ENCOUNTER — Other Ambulatory Visit: Payer: Self-pay | Admitting: Pharmacist

## 2018-06-09 DIAGNOSIS — I10 Essential (primary) hypertension: Secondary | ICD-10-CM

## 2018-06-09 DIAGNOSIS — E119 Type 2 diabetes mellitus without complications: Secondary | ICD-10-CM

## 2018-06-09 MED ORDER — CONTOUR NEXT MONITOR W/DEVICE KIT: 1.0000 | PACK | Freq: Three times a day (TID) | 0 refills | Status: DC

## 2018-06-09 MED ORDER — MICROLET LANCETS MISC: 11 refills | Status: DC

## 2018-06-09 MED ORDER — GLUCOSE BLOOD VI STRP: ORAL_STRIP | 12 refills | Status: DC

## 2018-06-09 MED FILL — CONTOUR NEXT METER: W/DEVICE | 1 days supply | Qty: 1 | Fill #0

## 2018-06-09 MED FILL — MICROLET LANCETS MISC: 30 days supply | Qty: 100 | Fill #0

## 2018-06-09 MED FILL — CONTOUR NEXT STRIPS: 30 days supply | Qty: 100 | Fill #0

## 2018-06-14 ENCOUNTER — Other Ambulatory Visit: Payer: Self-pay

## 2018-06-14 ENCOUNTER — Ambulatory Visit: Payer: Self-pay

## 2018-06-30 ENCOUNTER — Other Ambulatory Visit: Payer: Self-pay | Admitting: Family Medicine

## 2018-06-30 ENCOUNTER — Other Ambulatory Visit: Payer: Self-pay | Admitting: Physician Assistant

## 2018-06-30 DIAGNOSIS — E119 Type 2 diabetes mellitus without complications: Secondary | ICD-10-CM

## 2018-06-30 MED FILL — cloNIDine HCL 0.1 MG TABS: 0.1 | 30 days supply | Qty: 30 | Fill #1

## 2018-06-30 MED FILL — levETIRAcetam 500 MG TABS: 500 | 30 days supply | Qty: 60 | Fill #5

## 2018-06-30 MED FILL — MAGNESIUM OXIDE 400 MG TAB: 400 | 30 days supply | Qty: 30 | Fill #2

## 2018-06-30 MED FILL — GABAPENTIN 300 MG CAPSULE: 300 | 30 days supply | Qty: 30 | Fill #1

## 2018-06-30 MED FILL — ATORVASTATIN 20 MG TABLET: 20 | 30 days supply | Qty: 30 | Fill #2

## 2018-06-30 MED FILL — AMLODIPINE BESYLATE 10 MG T: 10 | 30 days supply | Qty: 30 | Fill #5

## 2018-07-03 MED FILL — metFORMIN HCL 500 MG TABS: 500 | 90 days supply | Qty: 90 | Fill #0

## 2018-07-03 MED FILL — DICLOFENAC SOD EC 75 MG TAB: 75 | 30 days supply | Qty: 60 | Fill #0

## 2018-07-04 ENCOUNTER — Other Ambulatory Visit: Payer: Self-pay

## 2018-07-04 ENCOUNTER — Encounter: Payer: Self-pay | Admitting: Family Medicine

## 2018-07-04 ENCOUNTER — Ambulatory Visit: Payer: Self-pay | Attending: Family Medicine | Admitting: Family Medicine

## 2018-07-04 VITALS — BP 121/84 | HR 95 | Temp 98.6°F | Ht 61.0 in | Wt 175.0 lb

## 2018-07-04 DIAGNOSIS — N898 Other specified noninflammatory disorders of vagina: Secondary | ICD-10-CM

## 2018-07-04 DIAGNOSIS — L299 Pruritus, unspecified: Secondary | ICD-10-CM

## 2018-07-04 DIAGNOSIS — E119 Type 2 diabetes mellitus without complications: Secondary | ICD-10-CM

## 2018-07-04 DIAGNOSIS — I10 Essential (primary) hypertension: Secondary | ICD-10-CM

## 2018-07-04 DIAGNOSIS — R232 Flushing: Secondary | ICD-10-CM

## 2018-07-04 DIAGNOSIS — G629 Polyneuropathy, unspecified: Secondary | ICD-10-CM

## 2018-07-04 LAB — POCT GLYCOSYLATED HEMOGLOBIN (HGB A1C): HbA1c, POC (controlled diabetic range): 5.6 % (ref 0.0–7.0)

## 2018-07-04 LAB — GLUCOSE, POCT (MANUAL RESULT ENTRY): POC Glucose: 88 mg/dl (ref 70–99)

## 2018-07-04 MED ORDER — METFORMIN HCL 500 MG PO TABS: 500.0000 mg | ORAL_TABLET | Freq: Every day | ORAL | 1 refills | Status: DC

## 2018-07-04 MED ORDER — AMLODIPINE BESYLATE 10 MG PO TABS: 10.0000 mg | ORAL_TABLET | Freq: Every day | ORAL | 1 refills | Status: DC

## 2018-07-04 MED ORDER — ATORVASTATIN CALCIUM 20 MG PO TABS: 20.0000 mg | ORAL_TABLET | Freq: Every day | ORAL | 1 refills | Status: DC

## 2018-07-04 MED ORDER — GABAPENTIN 300 MG PO CAPS: 300.0000 mg | ORAL_CAPSULE | Freq: Every day | ORAL | 3 refills | Status: DC

## 2018-07-04 MED ORDER — CETIRIZINE HCL 10 MG PO TABS: 10.0000 mg | ORAL_TABLET | Freq: Every day | ORAL | 1 refills | Status: DC

## 2018-07-04 MED ORDER — CARBAMIDE PEROXIDE 6.5 % OT SOLN: 3.0000 [drp] | Freq: Two times a day (BID) | OTIC | 0 refills | Status: DC

## 2018-07-04 MED ORDER — CLONIDINE HCL 0.1 MG PO TABS: 0.1000 mg | ORAL_TABLET | Freq: Every evening | ORAL | 1 refills | Status: DC | PRN

## 2018-07-04 MED ORDER — CLOTRIMAZOLE 1 % EX CREA: 1.0000 "application " | TOPICAL_CREAM | Freq: Two times a day (BID) | CUTANEOUS | 0 refills | Status: DC

## 2018-07-04 NOTE — Progress Notes (Signed)
Patient is having itching in right ear.  Patient has questions about Gabapentin.

## 2018-07-04 NOTE — Progress Notes (Signed)
Subjective:  Patient ID: Christina Byrd, female    DOB: 1965/08/10  Age: 53 y.o. MRN: [id number]  CC: Ear Pain   HPI Christina Byrd  is a 53 year old female with a history of type 2 diabetes mellitus (A1c 5.8), GERD, Seizures who presents today for an acute visit. She complains of itching in her right ear but denies pain.  Sometimes puts in a foreign object in an attempt to itch it and then feels a sharp pain after doing that. I had placed her on Debrox and Zyrtec but she never obtained these.  She complains of vaginal itching which she noticed recently after she had sexual intercourse.  Denies vaginal discharge, dysuria, frequency, abdominal pain She has questions about gabapentin which was prescribed for neuropathy as she found out it can be used to treat seizures and she is already on Keppra for seizures. She has no adverse effects from gabapentin. Doing well on her antihypertensive and denies adverse effects.  Past Medical History:  Diagnosis Date  . GASTROESOPHAGEAL REFLUX, NO ESOPHAGITIS 04/21/2006   Qualifier: Diagnosis of  By: Benna Dunks    . GERD (gastroesophageal reflux disease) Dx 1995  . HTN (hypertension)     No past surgical history on file.  Family History  Problem Relation Age of Onset  . CAD Mother     Allergies  Allergen Reactions  . Losartan Potassium     Rash  . Levaquin [Levofloxacin In D5w] Rash  . Ace Inhibitors Cough    REACTION: cough  . Codeine Nausea Only  . Cefepime Rash    09/04/12 pm Patient started to break out with small macules after IV Vanco infusion, then macules increased in size after starting cefepime infusion.  . Vancomycin Rash    09/04/12 pm Patient started to break out with small macules after IV Vanco infusion, then macules increased in size after starting cefepime infusion.    Outpatient Medications Prior to Visit  Medication Sig Dispense Refill  . amLODipine (NORVASC) 10 MG tablet Take 1 tablet (10  mg total) by mouth daily. 90 tablet 1  . aspirin EC 81 MG tablet Take 1 tablet (81 mg total) by mouth daily.    Marland Kitchen atorvastatin (LIPITOR) 20 MG tablet Take 1 tablet (20 mg total) by mouth daily. 90 tablet 1  . Blood Glucose Monitoring Suppl (CONTOUR NEXT MONITOR) w/Device KIT 1 kit by Does not apply route 3 (three) times daily. CHECK BLOOD SUGAR UP TO 3 TIMES DAILY. E11.9 1 kit 0  . cloNIDine (CATAPRES) 0.1 MG tablet Take 1 tablet (0.1 mg total) by mouth at bedtime as needed. For hot flashes 90 tablet 1  . diclofenac (VOLTAREN) 75 MG EC tablet TAKE 1 TABLET (75 MG TOTAL) BY MOUTH 2 (TWO) TIMES DAILY. 60 tablet 1  . fluticasone (FLONASE) 50 MCG/ACT nasal spray Place 1 spray into both nostrils daily. 16 g 1  . gabapentin (NEURONTIN) 300 MG capsule Take 1 capsule (300 mg total) by mouth at bedtime. 30 capsule 3  . glucose blood (CONTOUR NEXT TEST) test strip CHECK BLOOD SUGAR UP TO 3 TIMES DAILY. E11.9 100 each 12  . levETIRAcetam (KEPPRA) 500 MG tablet Take 1 tablet (500 mg total) by mouth 2 (two) times daily. 180 tablet 3  . magnesium oxide (MAG-OX) 400 MG tablet Take 1 tablet (400 mg total) by mouth daily. 90 tablet 3  . metFORMIN (GLUCOPHAGE) 500 MG tablet Take 1 tablet (500 mg total) by mouth daily with  breakfast. 90 tablet 1  . Microlet Lancets MISC CHECK BLOOD SUGAR UP TO 3 TIMES DAILY. E11.9 100 each 11  . pantoprazole (PROTONIX) 20 MG tablet TAKE 1 TABLET BY MOUTH 2 TIMES DAILY BEFORE A MEAL 60 tablet 2  . potassium chloride SA (K-DUR,KLOR-CON) 20 MEQ tablet Take 1 tablet (20 mEq total) by mouth 2 (two) times daily. 20 tablet 0  . carbamide peroxide (DEBROX) 6.5 % OTIC solution Place 3 drops into both ears 2 (two) times daily. (Patient not taking: Reported on 07/04/2018) 15 mL 0  . cetirizine (ZYRTEC) 10 MG tablet Take 1 tablet (10 mg total) by mouth daily. (Patient not taking: Reported on 07/04/2018) 30 tablet 1   No facility-administered medications prior to visit.      ROS Review of  Systems  Constitutional: Negative for activity change, appetite change and fatigue.  HENT: Negative for congestion, ear pain, sinus pressure and sore throat.   Eyes: Negative for visual disturbance.  Respiratory: Negative for cough, chest tightness, shortness of breath and wheezing.   Cardiovascular: Negative for chest pain and palpitations.  Gastrointestinal: Negative for abdominal distention, abdominal pain and constipation.  Endocrine: Negative for polydipsia.  Genitourinary: Negative for dysuria, frequency, vaginal discharge and vaginal pain.  Musculoskeletal: Negative for arthralgias and back pain.  Skin: Negative for rash.  Neurological: Negative for tremors, light-headedness and numbness.  Hematological: Does not bruise/bleed easily.  Psychiatric/Behavioral: Negative for agitation and behavioral problems.    Objective:  Pulse 95   Temp 98.6 F (37 C) (Oral)   Ht '5\' 1"'$  (1.549 m)   Wt 175 lb (79.4 kg)   LMP 12/17/2014   SpO2 97%   BMI 33.07 kg/m   BP/Weight 07/04/2018 05/01/2018 2/67/1245  Systolic BP - 809 983  Diastolic BP - 73 89  Wt. (Lbs) 175 183 183  BMI 33.07 34.58 34.58      Physical Exam Constitutional:      Appearance: She is well-developed.  HENT:     Head:     Comments: Cerumen in R ear Cardiovascular:     Rate and Rhythm: Normal rate.     Heart sounds: Normal heart sounds. No murmur.  Pulmonary:     Effort: Pulmonary effort is normal.     Breath sounds: Normal breath sounds. No wheezing or rales.  Chest:     Chest wall: No tenderness.  Abdominal:     General: Bowel sounds are normal. There is no distension.     Palpations: Abdomen is soft. There is no mass.     Tenderness: There is no abdominal tenderness.  Musculoskeletal: Normal range of motion.  Neurological:     Mental Status: She is alert and oriented to person, place, and time.  Psychiatric:        Mood and Affect: Mood normal.        Behavior: Behavior normal.     CMP Latest Ref Rng  & Units 05/01/2018 11/10/2017 09/15/2017  Glucose 65 - 99 mg/dL 158(H) 126(H) 123(H)  BUN 6 - 24 mg/dL '13 15 11  '$ Creatinine 0.57 - 1.00 mg/dL 0.93 0.93 0.97  Sodium 134 - 144 mmol/L 145(H) 140 143  Potassium 3.5 - 5.2 mmol/L 3.9 4.0 5.0  Chloride 96 - 106 mmol/L 104 104 103  CO2 20 - 29 mmol/L 17(L) 23 19(L)  Calcium 8.7 - 10.2 mg/dL 9.0 9.0 9.6  Total Protein 6.5 - 8.1 g/dL - 7.4 -  Total Bilirubin 0.3 - 1.2 mg/dL - 0.8 -  Alkaline  Phos 38 - 126 U/L - 86 -  AST 15 - 41 U/L - 20 -  ALT 0 - 44 U/L - 16 -    Lipid Panel     Component Value Date/Time   CHOL 161 06/22/2017 1041   TRIG 253 (H) 06/22/2017 1041   HDL 44 06/22/2017 1041   CHOLHDL 3.7 06/22/2017 1041   CHOLHDL 2.4 10/26/2012 1052   VLDL 46 (H) 10/26/2012 1052   LDLCALC 66 06/22/2017 1041   LDLDIRECT 153 (H) 01/30/2008 2146    CBC    Component Value Date/Time   WBC 8.7 11/10/2017 1144   RBC 4.36 11/10/2017 1144   HGB 12.8 11/10/2017 1144   HCT 39.6 11/10/2017 1144   PLT 197 11/10/2017 1144   MCV 90.8 11/10/2017 1144   MCH 29.4 11/10/2017 1144   MCHC 32.3 11/10/2017 1144   RDW 15.9 (H) 11/10/2017 1144   LYMPHSABS 1.2 11/10/2017 1144   MONOABS 0.5 11/10/2017 1144   EOSABS 0.1 11/10/2017 1144   BASOSABS 0.1 11/10/2017 1144    Lab Results  Component Value Date   HGBA1C 5.8 (H) 03/14/2018    Assessment & Plan:   1. Controlled type 2 diabetes mellitus without complication, without long-term current use of insulin (HCC) Controlled with A1c of 5.8 - POCT glucose (manual entry) - POCT glycosylated hemoglobin (Hb A1C) - atorvastatin (LIPITOR) 20 MG tablet; Take 1 tablet (20 mg total) by mouth daily.  Dispense: 90 tablet; Refill: 1 - metFORMIN (GLUCOPHAGE) 500 MG tablet; Take 1 tablet (500 mg total) by mouth daily with breakfast.  Dispense: 90 tablet; Refill: 1  2. HYPERTENSION, BENIGN ESSENTIAL Controlled - amLODipine (NORVASC) 10 MG tablet; Take 1 tablet (10 mg total) by mouth daily.  Dispense: 90 tablet;  Refill: 1  3. Hot flashes Stable - cloNIDine (CATAPRES) 0.1 MG tablet; Take 1 tablet (0.1 mg total) by mouth at bedtime as needed. For hot flashes  Dispense: 90 tablet; Refill: 1  4. Neuropathy Advised she can continue with gabapentin despite being on Keppra for seizures - gabapentin (NEURONTIN) 300 MG capsule; Take 1 capsule (300 mg total) by mouth at bedtime.  Dispense: 30 capsule; Refill: 3  5. Ear itch - cetirizine (ZYRTEC) 10 MG tablet; Take 1 tablet (10 mg total) by mouth daily.  Dispense: 30 tablet; Refill: 1 - carbamide peroxide (DEBROX) 6.5 % OTIC solution; Place 3 drops into both ears 2 (two) times daily.  Dispense: 15 mL; Refill: 0  6. Vaginal itching - clotrimazole (LOTRIMIN) 1 % cream; Apply 1 application topically 2 (two) times daily.  Dispense: 30 g; Refill: 0   Return in about 3 months (around 10/04/2018) for Chronic medical conditions.    Charlott Rakes, MD, FAAFP. [facility] and [place], [place]   07/04/2018, 9:37 AM

## 2018-07-10 ENCOUNTER — Encounter: Payer: Self-pay | Admitting: Neurology

## 2018-07-10 ENCOUNTER — Telehealth (INDEPENDENT_AMBULATORY_CARE_PROVIDER_SITE_OTHER): Payer: Self-pay | Admitting: Neurology

## 2018-07-10 ENCOUNTER — Other Ambulatory Visit: Payer: Self-pay

## 2018-07-10 VITALS — Ht 62.0 in | Wt 175.0 lb

## 2018-07-10 DIAGNOSIS — G40209 Localization-related (focal) (partial) symptomatic epilepsy and epileptic syndromes with complex partial seizures, not intractable, without status epilepticus: Secondary | ICD-10-CM

## 2018-07-10 MED ORDER — LEVETIRACETAM 500 MG PO TABS: 500.0000 mg | ORAL_TABLET | Freq: Two times a day (BID) | ORAL | 11 refills | Status: DC

## 2018-07-10 NOTE — Progress Notes (Signed)
Virtual Visit via Video Note The purpose of this virtual visit is to provide medical care while limiting exposure to the novel coronavirus.    Consent was obtained for video visit:  Yes.   Answered questions that patient had about telehealth interaction:  Yes.   I discussed the limitations, risks, security and privacy concerns of performing an evaluation and management service by telemedicine. I also discussed with the patient that there may be a patient responsible charge related to this service. The patient expressed understanding and agreed to proceed.  Pt location: Home Physician Location: office Name of referring provider:  Hoy Register, MD I connected with Christina Byrd at patients initiation/request on 07/10/2018 at  2:30 PM EDT by video enabled telemedicine application and verified that I am speaking with the correct person using two identifiers. 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 Since her last visit, she had one seizure 2 days ago. Her friend was driving and he noticed she stopped talking, looking around confused and in a daze, similar to prior seizure. This lasted for 5 minutes. She was late to take her Keppra the morning prior so she did not take the evening dose. She may have also had more alcohol than usual because it was her brother's birthday. She reports cutting down on alcohol, she was previously drinking 4-5 beers and has cut down to 2 beers and a few drinks. She asks about gabapentin which was prescribed for neuropathy. She reports that the numbness only occurs when she has been sitting on the commode for 15 minutes, her legs are numb, tingling, with little needles. These go away after she starts walking. She has  not taken the gabapentin. She denies any headaches, dizziness, vision changes, no falls. She is not driving.   History on Initial Assessment 11/18/2017: This is a 53 year old right-handed woman with a history of alcohol abuse, diabetes, hypertension, presenting for evaluation of recurrent seizures. The first seizure occurred in May 2018, she had a seizure in her sleep, then had another one before EMS arrived. Her significant other states she was in a daze and not responding in between seizures, then had another en route to the hospital. Per ER notes, she had 4 seizures that day. No focal weakness. Her CK was elevated, UDS positive for cocaine. 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 EEG normal. 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 She was seizure-free for almost a year until July 2019 when she had another convulsion. She denies any prior warning symptoms, waking up in the ambulance. At that time, her potassium and magnesium levels were very low. UDS negative. She was discharged home on Keppra but had not been taking it because she would have nausea and vomiting taking it together with her other medications. She had a third seizure on 11/10/2017, her significant other reports that this also occurred in sleep, she had heavy breathing with urinary incontinence. She would be confused after, no focal weakness. She was discharged home on Keppra 500mg  BID which she is tolerating without side effects now that she is taking medications separately. She has occasional episodes where she smells an odd/terrible smell like blood  or trash. Her significant other denies any staring/unresponsive episodes except in between the seizures. She denies any gaps in time, rising epigastric sensation, focal numbness/tingling/weakness, deja vu sensations, or myoclonic jerks. She has a history of heavy alcohol use, she usually drinks 2 beers and 4 shots of liquor  every night, at most 3 12-oz beers and 6 shots of liquor. She recalls that the night prior to the seizures she had more alcohol and was up until 3am. She denies any headaches, dizziness, vision changes, neck/back pain, bowel/bladder dysfunction except for occasional constipation. Her father had seizures due to alcohol. Otherwise she had a normal birth and early development.  There is no history of febrile convulsions, CNS infections such as meningitis/encephalitis, significant traumatic brain injury, neurosurgical procedures.   Observations/Objective:   Vitals:   07/10/18 1420  Weight: 175 lb (79.4 kg)  Height: 5\' 2"  (1.575 m)   GEN:  The patient appears stated age and is in NAD. Neurological examination: Patient is awake, alert, oriented x 3. No aphasia or dysarthria. Intact fluency and comprehension. Remote and recent memory intact. Able to name and repeat. Cranial nerves: Extraocular movements intact with no nystagmus. No facial asymmetry. Motor: moves all extremities symmetrically, at least anti-gravity x 4. No incoordination on finger to nose testing. Gait: narrow-based and steady, able to tandem walk adequately. Negative Romberg test.  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 She had a seizure 2 days ago with behavioral arrest, possibly provoked by missing a dose of Keppra the night prior. She continues to drink alcohol on a daily basis, reporting she has cut down, she was encouraged to continue with plans for alcohol cessation. We discussed avoidance of seizure triggers, including missing medication, sleep deprivation, and cutting down on alcohol. Refills for Levetiracetam 500mg  BID were sent. [place] driving laws were again discussed with the patient, and she knows to stop driving after a seizure, until 6 months seizure-free. We  discussed her gabapentin concerns,she states paresthesias only occur when sitting on commode for prolonged periods. Can hold off on gabapentin unless neuropathy/paresthesias become more constant/bothersome. She will follow-up in 6 months and knows to call for any changes.   Follow Up Instructions:   -I discussed the assessment and treatment plan with the patient. The patient was provided an opportunity to ask questions and all were answered. The patient agreed with the plan and demonstrated an understanding of the instructions.   The patient was advised to call back or seek an in-person evaluation if the symptoms worsen or if the condition fails to improve as anticipated.    Van Clines, MD

## 2018-07-11 ENCOUNTER — Encounter

## 2018-07-11 ENCOUNTER — Ambulatory Visit: Payer: Self-pay | Admitting: Neurology

## 2018-07-28 MED FILL — PANTOPRAZOLE SOD DR 20 MG T: 20 | 30 days supply | Qty: 60 | Fill #1

## 2018-07-28 MED FILL — AMLODIPINE BESYLATE 10 MG T: 10 | 30 days supply | Qty: 30 | Fill #0

## 2018-07-28 MED FILL — levETIRAcetam 500 MG TABS: 500 | 30 days supply | Qty: 60 | Fill #6

## 2018-07-28 MED FILL — cloNIDine HCL 0.1 MG TABS: 0.1 | 30 days supply | Qty: 30 | Fill #2

## 2018-07-28 MED FILL — ATORVASTATIN 20 MG TABLET: 20 | 30 days supply | Qty: 30 | Fill #3

## 2018-07-28 MED FILL — DICLOFENAC SOD EC 75 MG TAB: 75 | 30 days supply | Qty: 60 | Fill #1

## 2018-08-21 ENCOUNTER — Ambulatory Visit: Payer: Self-pay | Admitting: Family Medicine

## 2018-08-28 ENCOUNTER — Ambulatory Visit: Payer: Self-pay | Attending: Family Medicine | Admitting: Family Medicine

## 2018-08-28 ENCOUNTER — Other Ambulatory Visit: Payer: Self-pay | Admitting: Family Medicine

## 2018-08-28 ENCOUNTER — Other Ambulatory Visit: Payer: Self-pay

## 2018-08-28 ENCOUNTER — Encounter: Payer: Self-pay | Admitting: Family Medicine

## 2018-08-28 VITALS — BP 113/77 | HR 100 | Temp 98.5°F | Ht 61.0 in | Wt 168.0 lb

## 2018-08-28 DIAGNOSIS — G8929 Other chronic pain: Secondary | ICD-10-CM

## 2018-08-28 DIAGNOSIS — L723 Sebaceous cyst: Secondary | ICD-10-CM

## 2018-08-28 DIAGNOSIS — M25561 Pain in right knee: Secondary | ICD-10-CM

## 2018-08-28 DIAGNOSIS — M19011 Primary osteoarthritis, right shoulder: Secondary | ICD-10-CM

## 2018-08-28 DIAGNOSIS — E119 Type 2 diabetes mellitus without complications: Secondary | ICD-10-CM

## 2018-08-28 DIAGNOSIS — E6609 Other obesity due to excess calories: Secondary | ICD-10-CM

## 2018-08-28 DIAGNOSIS — R232 Flushing: Secondary | ICD-10-CM

## 2018-08-28 DIAGNOSIS — Z6831 Body mass index (BMI) 31.0-31.9, adult: Secondary | ICD-10-CM

## 2018-08-28 LAB — GLUCOSE, POCT (MANUAL RESULT ENTRY): POC Glucose: 140 mg/dl — AB (ref 70–99)

## 2018-08-28 MED ORDER — PREDNISONE 20 MG PO TABS: 20.0000 mg | ORAL_TABLET | Freq: Two times a day (BID) | ORAL | 0 refills | Status: DC

## 2018-08-28 MED ORDER — CLONIDINE HCL 0.2 MG PO TABS: 0.2000 mg | ORAL_TABLET | Freq: Every evening | ORAL | 3 refills | Status: DC | PRN

## 2018-08-28 MED FILL — cloNIDine HCL 0.2 MG TABS: 0.2 | 30 days supply | Qty: 30 | Fill #0

## 2018-08-28 MED FILL — levETIRAcetam 500 MG TABS: 500 | 30 days supply | Qty: 60 | Fill #7

## 2018-08-28 MED FILL — MAGNESIUM OXIDE 400 MG TAB: 400 | 30 days supply | Qty: 30 | Fill #3

## 2018-08-28 MED FILL — PANTOPRAZOLE SOD DR 20 MG T: 20 | 30 days supply | Qty: 60 | Fill #2

## 2018-08-28 MED FILL — ATORVASTATIN 20 MG TABLET: 20 | 30 days supply | Qty: 30 | Fill #4

## 2018-08-28 MED FILL — AMLODIPINE BESYLATE 10 MG T: 10 | 30 days supply | Qty: 30 | Fill #1

## 2018-08-28 MED FILL — cloNIDine HCL 0.1 MG TABS: 0.1 | 30 days supply | Qty: 30 | Fill #3

## 2018-08-28 MED FILL — predniSONE 20 MG TABS: 20 | 5 days supply | Qty: 10 | Fill #0

## 2018-08-28 NOTE — Progress Notes (Signed)
Subjective:  Patient ID: Christina Byrd, female    DOB: December 24, 1965  Age: 53 y.o. MRN: [id number]  CC: Diabetes   HPI Christina Byrd  is a 53 year old female with a history of type 2 diabetes mellitus (A1c 5.8), GERD, Seizures who presents today for an acute visit. She complains of a cyst on her right lower back and is requesting excision of that cyst.  Lesion is not painful but is uncomfortable. She complains of a one-month history of pain in the medial aspect and posterior aspect of her right knee which has been present for 1 month with associated swelling and increases when she has been on her feet for prolonged period of time. She also has right shoulder pain on the anterior aspect of her right shoulder and abduction is limited.  This has been present for the last 1-1/2 months after she picked up a pot and heard a pop sound in her shoulder. She complains of hot flashes are not controlled as she has them both day and night and complains of "feeling horny" a lot.   Past Medical History:  Diagnosis Date  . GASTROESOPHAGEAL REFLUX, NO ESOPHAGITIS 04/21/2006   Qualifier: Diagnosis of  By: Benna Dunks    . GERD (gastroesophageal reflux disease) Dx 1995  . HTN (hypertension)     No past surgical history on file.  Family History  Problem Relation Age of Onset  . CAD Mother     Allergies  Allergen Reactions  . Losartan Potassium     Rash  . Levaquin [Levofloxacin In D5w] Rash  . Ace Inhibitors Cough    REACTION: cough  . Codeine Nausea Only  . Cefepime Rash    09/04/12 pm Patient started to break out with small macules after IV Vanco infusion, then macules increased in size after starting cefepime infusion.  . Vancomycin Rash    09/04/12 pm Patient started to break out with small macules after IV Vanco infusion, then macules increased in size after starting cefepime infusion.    Outpatient Medications Prior to Visit  Medication Sig Dispense Refill  . amLODipine (NORVASC)  10 MG tablet Take 1 tablet (10 mg total) by mouth daily. 90 tablet 1  . aspirin EC 81 MG tablet Take 1 tablet (81 mg total) by mouth daily.    Marland Kitchen atorvastatin (LIPITOR) 20 MG tablet Take 1 tablet (20 mg total) by mouth daily. 90 tablet 1  . Blood Glucose Monitoring Suppl (CONTOUR NEXT MONITOR) w/Device KIT 1 kit by Does not apply route 3 (three) times daily. CHECK BLOOD SUGAR UP TO 3 TIMES DAILY. E11.9 1 kit 0  . carbamide peroxide (DEBROX) 6.5 % OTIC solution Place 3 drops into both ears 2 (two) times daily. 15 mL 0  . cetirizine (ZYRTEC) 10 MG tablet Take 1 tablet (10 mg total) by mouth daily. 30 tablet 1  . diclofenac (VOLTAREN) 75 MG EC tablet TAKE 1 TABLET (75 MG TOTAL) BY MOUTH 2 (TWO) TIMES DAILY. 60 tablet 1  . gabapentin (NEURONTIN) 300 MG capsule Take 1 capsule (300 mg total) by mouth at bedtime. 30 capsule 3  . glucose blood (CONTOUR NEXT TEST) test strip CHECK BLOOD SUGAR UP TO 3 TIMES DAILY. E11.9 100 each 12  . levETIRAcetam (KEPPRA) 500 MG tablet Take 1 tablet (500 mg total) by mouth 2 (two) times daily. 60 tablet 11  . magnesium oxide (MAG-OX) 400 MG tablet Take 1 tablet (400 mg total) by mouth daily. 90 tablet 3  . metFORMIN (  GLUCOPHAGE) 500 MG tablet Take 1 tablet (500 mg total) by mouth daily with breakfast. 90 tablet 1  . Microlet Lancets MISC CHECK BLOOD SUGAR UP TO 3 TIMES DAILY. E11.9 100 each 11  . pantoprazole (PROTONIX) 20 MG tablet TAKE 1 TABLET BY MOUTH 2 TIMES DAILY BEFORE A MEAL 60 tablet 2  . cloNIDine (CATAPRES) 0.1 MG tablet Take 1 tablet (0.1 mg total) by mouth at bedtime as needed. For hot flashes 90 tablet 1   No facility-administered medications prior to visit.      ROS Review of Systems  Constitutional: Negative for activity change, appetite change and fatigue.  HENT: Negative for congestion, sinus pressure and sore throat.   Eyes: Negative for visual disturbance.  Respiratory: Negative for cough, chest tightness, shortness of breath and wheezing.    Cardiovascular: Negative for chest pain and palpitations.  Gastrointestinal: Negative for abdominal distention, abdominal pain and constipation.  Endocrine: Negative for polydipsia.  Genitourinary: Negative for dysuria and frequency.  Musculoskeletal:       See hpi  Skin: Negative for rash.  Neurological: Negative for tremors, light-headedness and numbness.  Hematological: Does not bruise/bleed easily.  Psychiatric/Behavioral: Negative for agitation and behavioral problems.    Objective:  BP 113/77   Pulse 100   Temp 98.5 F (36.9 C) (Oral)   Ht '5\' 1"'$  (1.549 m)   Wt 168 lb (76.2 kg)   LMP 12/17/2014   SpO2 98%   BMI 31.74 kg/m   BP/Weight 08/28/2018 07/10/2018 08/15/7626  Systolic BP 315 - -  Diastolic BP 77 - -  Wt. (Lbs) 168 175 175  BMI 31.74 32.01 32.01      Physical Exam Constitutional:      Appearance: She is well-developed.  Cardiovascular:     Rate and Rhythm: Normal rate.     Heart sounds: Normal heart sounds. No murmur.  Pulmonary:     Effort: Pulmonary effort is normal.     Breath sounds: Normal breath sounds. No wheezing or rales.  Chest:     Chest wall: No tenderness.  Abdominal:     General: Bowel sounds are normal. There is no distension.     Palpations: Abdomen is soft. There is no mass.     Tenderness: There is no abdominal tenderness.  Musculoskeletal: Normal range of motion.        General: Swelling (slight edema of posteromedial aspect  of  R knee, TTP of popliteal fossa, no palpable  mass. FROM) and tenderness (TTP of anterior R shoulder in [facility] joint ; abduction limited to 90 degrees) present.     Comments: Negative Homans sign No abnormality of the patella or anterior knee joint bilaterally  Skin:    Comments: Right lower back sebaceous cyst  Neurological:     Mental Status: She is alert and oriented to person, place, and time.     CMP Latest Ref Rng & Units 05/01/2018 11/10/2017 09/15/2017  Glucose 65 - 99 mg/dL 158(H) 126(H) 123(H)  BUN 6 -  24 mg/dL '13 15 11  '$ Creatinine 0.57 - 1.00 mg/dL 0.93 0.93 0.97  Sodium 134 - 144 mmol/L 145(H) 140 143  Potassium 3.5 - 5.2 mmol/L 3.9 4.0 5.0  Chloride 96 - 106 mmol/L 104 104 103  CO2 20 - 29 mmol/L 17(L) 23 19(L)  Calcium 8.7 - 10.2 mg/dL 9.0 9.0 9.6  Total Protein 6.5 - 8.1 g/dL - 7.4 -  Total Bilirubin 0.3 - 1.2 mg/dL - 0.8 -  Alkaline Phos 38 -  126 U/L - 86 -  AST 15 - 41 U/L - 20 -  ALT 0 - 44 U/L - 16 -    Lipid Panel     Component Value Date/Time   CHOL 161 06/22/2017 1041   TRIG 253 (H) 06/22/2017 1041   HDL 44 06/22/2017 1041   CHOLHDL 3.7 06/22/2017 1041   CHOLHDL 2.4 10/26/2012 1052   VLDL 46 (H) 10/26/2012 1052   LDLCALC 66 06/22/2017 1041   LDLDIRECT 153 (H) 01/30/2008 2146    CBC    Component Value Date/Time   WBC 8.7 11/10/2017 1144   RBC 4.36 11/10/2017 1144   HGB 12.8 11/10/2017 1144   HCT 39.6 11/10/2017 1144   PLT 197 11/10/2017 1144   MCV 90.8 11/10/2017 1144   MCH 29.4 11/10/2017 1144   MCHC 32.3 11/10/2017 1144   RDW 15.9 (H) 11/10/2017 1144   LYMPHSABS 1.2 11/10/2017 1144   MONOABS 0.5 11/10/2017 1144   EOSABS 0.1 11/10/2017 1144   BASOSABS 0.1 11/10/2017 1144    Lab Results  Component Value Date   HGBA1C 5.6 07/04/2018    Assessment & Plan:   1. Type 2 diabetes mellitus without complication, without long-term current use of insulin (HCC) Controlled with A1c of 5.6 - POCT glucose (manual entry)  2. Class 1 obesity due to excess calories without serious comorbidity with body mass index (BMI) of 31.0 to 31.9 in adult She has lost 7 pounds since her last visit 6 weeks ago and has been commended on this Increase physical activity  3. Hot flashes Uncontrolled Increase clonidine dose - cloNIDine (CATAPRES) 0.2 MG tablet; Take 1 tablet (0.2 mg total) by mouth at bedtime as needed. For hot flashes  Dispense: 60 tablet; Refill: 3  4. Arthritis of right acromioclavicular joint Currently on NSAID Advised to hold off on NSAID and use  short course of prednisone after which she will resume NSAID - predniSONE (DELTASONE) 20 MG tablet; Take 1 tablet (20 mg total) by mouth 2 (two) times daily with a meal.  Dispense: 10 tablet; Refill: 0  5. Chronic pain of right knee Low suspicion for DVT Placed on short course of steroid Consider ultrasound to rule out Baker's cyst if posterior right knee symptoms persist  6. Sebaceous cyst - Ambulatory referral to General Surgery   Meds ordered this encounter  Medications  . predniSONE (DELTASONE) 20 MG tablet    Sig: Take 1 tablet (20 mg total) by mouth 2 (two) times daily with a meal.    Dispense:  10 tablet    Refill:  0  . cloNIDine (CATAPRES) 0.2 MG tablet    Sig: Take 1 tablet (0.2 mg total) by mouth at bedtime as needed. For hot flashes    Dispense:  60 tablet    Refill:  3    Follow-up: Return if symptoms worsen or fail to improve, for keep previous appointment - tele visit.       Charlott Rakes, MD, FAAFP. [facility] [place], [place]   08/28/2018, 2:39 PM

## 2018-08-28 NOTE — Progress Notes (Signed)
Patient has swelling on right leg above knee cap.  Patient is also having shoulder pain.

## 2018-08-28 NOTE — Patient Instructions (Signed)
Shoulder Pain Many things can cause shoulder pain, including:  An injury.  Moving the shoulder in the same way again and again (overuse).  Joint pain (arthritis). Pain can come from:  Swelling and irritation (inflammation) of any part of the shoulder.  An injury to the shoulder joint.  An injury to: ? Tissues that connect muscle to bone (tendons). ? Tissues that connect bones to each other (ligaments). ? Bones. Follow these instructions at home: Watch for changes in your symptoms. Let your doctor know about them. Follow these instructions to help with your pain. If you have a sling:  Wear the sling as told by your doctor. Remove it only as told by your doctor.  Loosen the sling if your fingers: ? Tingle. ? Become numb. ? Turn cold and blue.  Keep the sling clean.  If the sling is not waterproof: ? Do not let it get wet. ? Take the sling off when you shower or bathe. Managing pain, stiffness, and swelling   If told, put ice on the painful area: ? Put ice in a plastic bag. ? Place a towel between your skin and the bag. ? Leave the ice on for 20 minutes, 2-3 times a day. Stop putting ice on if it does not help with the pain.  Squeeze a soft ball or a foam pad as much as possible. This prevents swelling in the shoulder. It also helps to strengthen the arm. General instructions  Take over-the-counter and prescription medicines only as told by your doctor.  Keep all follow-up visits as told by your doctor. This is important. Contact a doctor if:  Your pain gets worse.  Medicine does not help your pain.  You have new pain in your arm, hand, or fingers. Get help right away if:  Your arm, hand, or fingers: ? Tingle. ? Are numb. ? Are swollen. ? Are painful. ? Turn white or blue. Summary  Shoulder pain can be caused by many things. These include injury, moving the shoulder in the same away again and again, and joint pain.  Watch for changes in your symptoms.  Let your doctor know about them.  This condition may be treated with a sling, ice, and pain medicine.  Contact your doctor if the pain gets worse or you have new pain. Get help right away if your arm, hand, or fingers tingle or get numb, swollen, or painful.  Keep all follow-up visits as told by your doctor. This is important. This information is not intended to replace advice given to you by your health care provider. Make sure you discuss any questions you have with your health care provider. Document Released: 07/28/2007 Document Revised: 08/23/2017 Document Reviewed: 08/23/2017 Elsevier Patient Education  2020 [employer]  

## 2018-08-29 MED FILL — DICLOFENAC SOD EC 75 MG TAB: 75 | 30 days supply | Qty: 60 | Fill #0

## 2018-09-11 ENCOUNTER — Telehealth: Payer: Self-pay | Admitting: Family Medicine

## 2018-09-11 NOTE — Telephone Encounter (Signed)
Patient called wanting to know  What cream she was prescribed for her arthritis. Patient states she did not receive it. Please follow up.

## 2018-09-12 NOTE — Telephone Encounter (Signed)
Per patient she didn't feel comfortable taking the pills- diclofenac d/t h/o seizures. She was informed that the medication was OTC.  Did not see a cream on the medication list.   Please advise.

## 2018-09-13 NOTE — Telephone Encounter (Signed)
Patient was called and patient did not know the name of the medication, patient did not anything about conversation had with nurse yesterday.

## 2018-09-13 NOTE — Telephone Encounter (Signed)
She had requested diclofenac that is why she was on it.  She can discontinue it if she so desires.  I did not prescribe a cream for arthritis but I prescribed prednisone.  She can use ibuprofen for her arthritis.

## 2018-09-18 ENCOUNTER — Telehealth: Payer: Self-pay | Admitting: Family Medicine

## 2018-09-18 DIAGNOSIS — G8929 Other chronic pain: Secondary | ICD-10-CM

## 2018-09-18 DIAGNOSIS — M25551 Pain in right hip: Secondary | ICD-10-CM

## 2018-09-18 DIAGNOSIS — M25561 Pain in right knee: Secondary | ICD-10-CM

## 2018-09-18 DIAGNOSIS — M25552 Pain in left hip: Secondary | ICD-10-CM

## 2018-09-18 NOTE — Telephone Encounter (Signed)
Pt called to request imaging orders for her constant hip pain, she states her PCP is aware of the problem and current prescribed creams have not improved her pain to the point that she could not walk today. please advise

## 2018-09-20 NOTE — Telephone Encounter (Signed)
Patient states that she is having pain in her hips and her knees and the pain switches sides daily. Patient would like to have an xray of her hips done.

## 2018-09-21 NOTE — Telephone Encounter (Signed)
Ordered

## 2018-09-21 NOTE — Telephone Encounter (Signed)
Pt advised.

## 2018-09-22 ENCOUNTER — Ambulatory Visit (HOSPITAL_COMMUNITY)
Admission: RE | Admit: 2018-09-22 | Discharge: 2018-09-22 | Disposition: A | Discharge status: Home / Self Care | Payer: Self-pay | Source: Ambulatory Visit | Attending: Family Medicine | Admitting: Family Medicine

## 2018-09-22 ENCOUNTER — Other Ambulatory Visit: Payer: Self-pay

## 2018-09-22 DIAGNOSIS — G8929 Other chronic pain: Secondary | ICD-10-CM | POA: Insufficient documentation

## 2018-09-22 DIAGNOSIS — M25552 Pain in left hip: Secondary | ICD-10-CM | POA: Insufficient documentation

## 2018-09-22 DIAGNOSIS — M25561 Pain in right knee: Secondary | ICD-10-CM | POA: Insufficient documentation

## 2018-09-22 DIAGNOSIS — M25551 Pain in right hip: Secondary | ICD-10-CM | POA: Insufficient documentation

## 2018-09-22 DIAGNOSIS — M25562 Pain in left knee: Secondary | ICD-10-CM | POA: Insufficient documentation

## 2018-09-22 MED FILL — MAGNESIUM OXIDE 400 MG TAB: 400 | 30 days supply | Qty: 30 | Fill #4

## 2018-09-22 MED FILL — ATORVASTATIN 20 MG TABLET: 20 | 30 days supply | Qty: 30 | Fill #5

## 2018-09-22 MED FILL — levETIRAcetam 500 MG TABS: 500 | 30 days supply | Qty: 60 | Fill #8

## 2018-09-22 MED FILL — metFORMIN HCL 500 MG TABS: 500 | 30 days supply | Qty: 30 | Fill #0

## 2018-09-22 MED FILL — cloNIDine HCL 0.1 MG TABS: 0.1 | 30 days supply | Qty: 30 | Fill #4

## 2018-09-22 MED FILL — AMLODIPINE BESYLATE 10 MG T: 10 | 30 days supply | Qty: 30 | Fill #2

## 2018-09-22 MED FILL — DICLOFENAC SOD EC 75 MG TAB: 75 | 30 days supply | Qty: 60 | Fill #1

## 2018-09-25 ENCOUNTER — Telehealth (INDEPENDENT_AMBULATORY_CARE_PROVIDER_SITE_OTHER): Payer: Self-pay

## 2018-09-25 NOTE — Telephone Encounter (Signed)
-----   Message from Charlott Rakes, MD sent at 09/23/2018 12:57 PM EDT ----- Hip and  Pelvic   xrays are normal

## 2018-09-25 NOTE — Telephone Encounter (Signed)
Patient name and DOB has been verified Patient was informed of lab results. Patient had no questions.  

## 2018-09-25 NOTE — Telephone Encounter (Signed)
-----   Message from Charlott Rakes, MD sent at 09/23/2018  1:01 PM EDT ----- Knee xray reveal osteoarthritis in both knees

## 2018-10-03 ENCOUNTER — Other Ambulatory Visit: Payer: Self-pay | Admitting: Family Medicine

## 2018-10-03 DIAGNOSIS — L299 Pruritus, unspecified: Secondary | ICD-10-CM

## 2018-10-04 ENCOUNTER — Other Ambulatory Visit: Payer: Self-pay

## 2018-10-04 ENCOUNTER — Encounter: Payer: Self-pay | Admitting: Family Medicine

## 2018-10-04 ENCOUNTER — Ambulatory Visit: Payer: Self-pay | Attending: Family Medicine | Admitting: Family Medicine

## 2018-10-04 VITALS — BP 98/65 | HR 94 | Temp 98.0°F | Ht 61.0 in | Wt 168.0 lb

## 2018-10-04 DIAGNOSIS — G629 Polyneuropathy, unspecified: Secondary | ICD-10-CM

## 2018-10-04 DIAGNOSIS — I10 Essential (primary) hypertension: Secondary | ICD-10-CM

## 2018-10-04 DIAGNOSIS — E119 Type 2 diabetes mellitus without complications: Secondary | ICD-10-CM

## 2018-10-04 DIAGNOSIS — R232 Flushing: Secondary | ICD-10-CM

## 2018-10-04 LAB — POCT GLYCOSYLATED HEMOGLOBIN (HGB A1C): HbA1c, POC (controlled diabetic range): 5.7 % (ref 0.0–7.0)

## 2018-10-04 LAB — GLUCOSE, POCT (MANUAL RESULT ENTRY): POC Glucose: 102 mg/dl — AB (ref 70–99)

## 2018-10-04 MED ORDER — GABAPENTIN 300 MG PO CAPS: 300.0000 mg | ORAL_CAPSULE | Freq: Every day | ORAL | 1 refills | Status: DC

## 2018-10-04 MED ORDER — AMLODIPINE BESYLATE 10 MG PO TABS: 10.0000 mg | ORAL_TABLET | Freq: Every day | ORAL | 1 refills | Status: DC

## 2018-10-04 MED ORDER — CLONIDINE HCL 0.1 MG PO TABS: 0.1000 mg | ORAL_TABLET | Freq: Every evening | ORAL | 1 refills | Status: DC | PRN

## 2018-10-04 MED ORDER — ATORVASTATIN CALCIUM 20 MG PO TABS: 20.0000 mg | ORAL_TABLET | Freq: Every day | ORAL | 1 refills | Status: DC

## 2018-10-04 MED ORDER — METFORMIN HCL 500 MG PO TABS: 500.0000 mg | ORAL_TABLET | Freq: Every day | ORAL | 1 refills | Status: DC

## 2018-10-04 MED FILL — GABAPENTIN 300 MG CAPSULE: 300 | 90 days supply | Qty: 90 | Fill #0

## 2018-10-04 NOTE — Progress Notes (Signed)
Subjective:  Patient ID: Christina Byrd, female    DOB: December 13, 1965  Age: 53 y.o. MRN: [id number]  CC: Diabetes   HPI Christina Byrd is a 53 year old female with a history of type 2 diabetes mellitus (A1c 5.8), GERD, Seizures who presents today for a follow-up visit. She has not had any recent seizures and reflux symptoms are controlled. At her last visit she was seen for pain in her hips and her knees which she reports have resolved ever since she commenced using Hemp cream.  Her diabetes is controlled with an A1c of 5.7 today. She complains of her right ear feeling like she has got running water in it, she goes on to describe this as feeling like she is in a swimming pool but when she tries to clean it nothing comes out of her ears. Her med list reveals she is on Zyrtec which she has been unable to obtain.  Review of her list indicates this was refilled earlier this morning and she will be picking this up from the pharmacy. She has no additional concerns today.  Past Medical History:  Diagnosis Date  . GASTROESOPHAGEAL REFLUX, NO ESOPHAGITIS 04/21/2006   Qualifier: Diagnosis of  By: Benna Dunks    . GERD (gastroesophageal reflux disease) Dx 1995  . HTN (hypertension)     No past surgical history on file.  Family History  Problem Relation Age of Onset  . CAD Mother     Allergies  Allergen Reactions  . Losartan Potassium     Rash  . Levaquin [Levofloxacin In D5w] Rash  . Ace Inhibitors Cough    REACTION: cough  . Codeine Nausea Only  . Cefepime Rash    09/04/12 pm Patient started to break out with small macules after IV Vanco infusion, then macules increased in size after starting cefepime infusion.  . Vancomycin Rash    09/04/12 pm Patient started to break out with small macules after IV Vanco infusion, then macules increased in size after starting cefepime infusion.    Outpatient Medications Prior to Visit  Medication Sig Dispense Refill  . aspirin EC 81 MG  tablet Take 1 tablet (81 mg total) by mouth daily.    . Blood Glucose Monitoring Suppl (CONTOUR NEXT MONITOR) w/Device KIT 1 kit by Does not apply route 3 (three) times daily. CHECK BLOOD SUGAR UP TO 3 TIMES DAILY. E11.9 1 kit 0  . carbamide peroxide (DEBROX) 6.5 % OTIC solution Place 3 drops into both ears 2 (two) times daily. 15 mL 0  . cetirizine (ZYRTEC) 10 MG tablet TAKE 1 TABLET (10 MG TOTAL) BY MOUTH DAILY. 30 tablet 1  . diclofenac (VOLTAREN) 75 MG EC tablet TAKE 1 TABLET (75 MG TOTAL) BY MOUTH 2 (TWO) TIMES DAILY. 60 tablet 1  . glucose blood (CONTOUR NEXT TEST) test strip CHECK BLOOD SUGAR UP TO 3 TIMES DAILY. E11.9 100 each 12  . levETIRAcetam (KEPPRA) 500 MG tablet Take 1 tablet (500 mg total) by mouth 2 (two) times daily. 60 tablet 11  . magnesium oxide (MAG-OX) 400 MG tablet Take 1 tablet (400 mg total) by mouth daily. 90 tablet 3  . Microlet Lancets MISC CHECK BLOOD SUGAR UP TO 3 TIMES DAILY. E11.9 100 each 11  . pantoprazole (PROTONIX) 20 MG tablet TAKE 1 TABLET BY MOUTH 2 TIMES DAILY BEFORE A MEAL 60 tablet 2  . amLODipine (NORVASC) 10 MG tablet Take 1 tablet (10 mg total) by mouth daily. 90 tablet 1  . atorvastatin (  LIPITOR) 20 MG tablet Take 1 tablet (20 mg total) by mouth daily. 90 tablet 1  . cloNIDine (CATAPRES) 0.2 MG tablet Take 1 tablet (0.2 mg total) by mouth at bedtime as needed. For hot flashes 60 tablet 3  . gabapentin (NEURONTIN) 300 MG capsule Take 1 capsule (300 mg total) by mouth at bedtime. 30 capsule 3  . metFORMIN (GLUCOPHAGE) 500 MG tablet Take 1 tablet (500 mg total) by mouth daily with breakfast. 90 tablet 1  . predniSONE (DELTASONE) 20 MG tablet Take 1 tablet (20 mg total) by mouth 2 (two) times daily with a meal. 10 tablet 0  . cloNIDine (CATAPRES) 0.1 MG tablet      No facility-administered medications prior to visit.      ROS Review of Systems  Constitutional: Negative for activity change, appetite change and fatigue.  HENT: Negative for congestion,  ear discharge, ear pain, sinus pressure and sore throat.   Eyes: Negative for visual disturbance.  Respiratory: Negative for cough, chest tightness, shortness of breath and wheezing.   Cardiovascular: Negative for chest pain and palpitations.  Gastrointestinal: Negative for abdominal distention, abdominal pain and constipation.  Endocrine: Negative for polydipsia.  Genitourinary: Negative for dysuria and frequency.  Musculoskeletal: Negative for arthralgias and back pain.  Skin: Negative for rash.  Neurological: Negative for tremors, light-headedness and numbness.  Hematological: Does not bruise/bleed easily.  Psychiatric/Behavioral: Negative for agitation and behavioral problems.    Objective:  BP 98/65   Pulse 94   Temp 98 F (36.7 C) (Oral)   Ht '5\' 1"'$  (1.549 m)   Wt 168 lb (76.2 kg)   LMP 12/17/2014   SpO2 97%   BMI 31.74 kg/m   BP/Weight 10/04/2018 08/28/2018 0/63/0160  Systolic BP 98 109 -  Diastolic BP 65 77 -  Wt. (Lbs) 168 168 175  BMI 31.74 31.74 32.01      Physical Exam Constitutional:      Appearance: She is well-developed.  Cardiovascular:     Rate and Rhythm: Normal rate.     Heart sounds: Normal heart sounds. No murmur.  Pulmonary:     Effort: Pulmonary effort is normal.     Breath sounds: Normal breath sounds. No wheezing or rales.  Chest:     Chest wall: No tenderness.  Abdominal:     General: Bowel sounds are normal. There is no distension.     Palpations: Abdomen is soft. There is no mass.     Tenderness: There is no abdominal tenderness.  Musculoskeletal: Normal range of motion.  Neurological:     Mental Status: She is alert and oriented to person, place, and time.     CMP Latest Ref Rng & Units 05/01/2018 11/10/2017 09/15/2017  Glucose 65 - 99 mg/dL 158(H) 126(H) 123(H)  BUN 6 - 24 mg/dL '13 15 11  '$ Creatinine 0.57 - 1.00 mg/dL 0.93 0.93 0.97  Sodium 134 - 144 mmol/L 145(H) 140 143  Potassium 3.5 - 5.2 mmol/L 3.9 4.0 5.0  Chloride 96 - 106  mmol/L 104 104 103  CO2 20 - 29 mmol/L 17(L) 23 19(L)  Calcium 8.7 - 10.2 mg/dL 9.0 9.0 9.6  Total Protein 6.5 - 8.1 g/dL - 7.4 -  Total Bilirubin 0.3 - 1.2 mg/dL - 0.8 -  Alkaline Phos 38 - 126 U/L - 86 -  AST 15 - 41 U/L - 20 -  ALT 0 - 44 U/L - 16 -    Lipid Panel     Component Value Date/Time  CHOL 161 06/22/2017 1041   TRIG 253 (H) 06/22/2017 1041   HDL 44 06/22/2017 1041   CHOLHDL 3.7 06/22/2017 1041   CHOLHDL 2.4 10/26/2012 1052   VLDL 46 (H) 10/26/2012 1052   LDLCALC 66 06/22/2017 1041   LDLDIRECT 153 (H) 01/30/2008 2146    CBC    Component Value Date/Time   WBC 8.7 11/10/2017 1144   RBC 4.36 11/10/2017 1144   HGB 12.8 11/10/2017 1144   HCT 39.6 11/10/2017 1144   PLT 197 11/10/2017 1144   MCV 90.8 11/10/2017 1144   MCH 29.4 11/10/2017 1144   MCHC 32.3 11/10/2017 1144   RDW 15.9 (H) 11/10/2017 1144   LYMPHSABS 1.2 11/10/2017 1144   MONOABS 0.5 11/10/2017 1144   EOSABS 0.1 11/10/2017 1144   BASOSABS 0.1 11/10/2017 1144    Lab Results  Component Value Date   HGBA1C 5.7 10/04/2018    Assessment & Plan:   1. Controlled type 2 diabetes mellitus without complication, without long-term current use of insulin (HCC) Controlled with A1c of 5.7 Counseled on Diabetic diet, my plate method, 188 minutes of moderate intensity exercise/week Keep blood sugar logs with fasting goals of 80-120 mg/dl, random of less than 180 and in the event of sugars less than 60 mg/dl or greater than 400 mg/dl please notify the clinic ASAP. It is recommended that you undergo annual eye exams and annual foot exams. Pneumonia vaccine is recommended. - POCT glucose (manual entry) - POCT glycosylated hemoglobin (Hb C1Y) - Basic Metabolic Panel - Ambulatory referral to Ophthalmology - metFORMIN (GLUCOPHAGE) 500 MG tablet; Take 1 tablet (500 mg total) by mouth daily with breakfast.  Dispense: 90 tablet; Refill: 1 - atorvastatin (LIPITOR) 20 MG tablet; Take 1 tablet (20 mg total) by mouth  daily.  Dispense: 90 tablet; Refill: 1  2. HYPERTENSION, BENIGN ESSENTIAL Controlled Counseled on blood pressure goal of less than 130/80, low-sodium, DASH diet, medication compliance, 150 minutes of moderate intensity exercise per week. Discussed medication compliance, adverse effects. - amLODipine (NORVASC) 10 MG tablet; Take 1 tablet (10 mg total) by mouth daily.  Dispense: 90 tablet; Refill: 1  3. Hot flashes Controlled Decreased dose of clonidine from 0.'2mg'$  to 0.1 mg as per request - cloNIDine (CATAPRES) 0.1 MG tablet; Take 1 tablet (0.1 mg total) by mouth at bedtime as needed. For hot flashes  Dispense: 90 tablet; Refill: 1  4. Neuropathy Stable - gabapentin (NEURONTIN) 300 MG capsule; Take 1 capsule (300 mg total) by mouth at bedtime.  Dispense: 90 capsule; Refill: 1   Health Care Maintenance: Due for tetanus shot which she is opposed to receiving.  Will visit issue of flu shot once we have this available in the clinic.  Meds ordered this encounter  Medications  . amLODipine (NORVASC) 10 MG tablet    Sig: Take 1 tablet (10 mg total) by mouth daily.    Dispense:  90 tablet    Refill:  1  . cloNIDine (CATAPRES) 0.1 MG tablet    Sig: Take 1 tablet (0.1 mg total) by mouth at bedtime as needed. For hot flashes    Dispense:  90 tablet    Refill:  1    Reduce previous dose  . metFORMIN (GLUCOPHAGE) 500 MG tablet    Sig: Take 1 tablet (500 mg total) by mouth daily with breakfast.    Dispense:  90 tablet    Refill:  1  . gabapentin (NEURONTIN) 300 MG capsule    Sig: Take 1 capsule (300 mg  total) by mouth at bedtime.    Dispense:  90 capsule    Refill:  1  . atorvastatin (LIPITOR) 20 MG tablet    Sig: Take 1 tablet (20 mg total) by mouth daily.    Dispense:  90 tablet    Refill:  1    Follow-up: Return in about 6 months (around 04/06/2019) for medical conditions.       Charlott Rakes, MD, FAAFP. [facility] and [place], [place]   10/04/2018, 2:08 PM

## 2018-10-05 ENCOUNTER — Telehealth: Payer: Self-pay

## 2018-10-05 LAB — BASIC METABOLIC PANEL
BUN/Creatinine Ratio: 14 (ref 9–23)
BUN: 13 mg/dL (ref 6–24)
CO2: 21 mmol/L (ref 20–29)
Calcium: 8.3 mg/dL — ABNORMAL LOW (ref 8.7–10.2)
Chloride: 103 mmol/L (ref 96–106)
Creatinine, Ser: 0.95 mg/dL (ref 0.57–1.00)
GFR calc Af Amer: 79 mL/min/{1.73_m2} (ref >59)
GFR calc non Af Amer: 69 mL/min/{1.73_m2} (ref >59)
Glucose: 86 mg/dL (ref 65–99)
Potassium: 4.2 mmol/L (ref 3.5–5.2)
Sodium: 142 mmol/L (ref 134–144)

## 2018-10-05 NOTE — Telephone Encounter (Signed)
Patient name and DOB has been verified Patient was informed of lab results. Patient had no questions.  

## 2018-10-05 NOTE — Telephone Encounter (Signed)
-----   Message from Charlott Rakes, MD sent at 10/05/2018 10:18 AM EDT ----- Please inform the patient that labs are normal. Thank you.

## 2018-10-10 ENCOUNTER — Telehealth: Payer: Self-pay

## 2018-10-10 NOTE — Telephone Encounter (Signed)
Patient was called and patient states that she does not know what medication she is requesting. Patient states that she will call tomorrow with the correct medication.

## 2018-10-10 NOTE — Telephone Encounter (Signed)
Pt called states pharmacy will not release zyrtec or other meds saying it is too soon. Pt req return call to explain.

## 2018-10-24 ENCOUNTER — Other Ambulatory Visit: Payer: Self-pay | Admitting: Family Medicine

## 2018-10-24 MED FILL — DICLOFENAC SOD EC 75 MG TAB: 75 | 30 days supply | Qty: 60 | Fill #0

## 2018-10-24 MED FILL — MAGNESIUM OXIDE 400 MG TAB: 400 | 30 days supply | Qty: 30 | Fill #5

## 2018-10-24 MED FILL — ATORVASTATIN 20 MG TABLET: 20 | 30 days supply | Qty: 30 | Fill #0

## 2018-10-24 MED FILL — cloNIDine HCL 0.1 MG TABS: 0.1 | 30 days supply | Qty: 30 | Fill #5

## 2018-10-24 MED FILL — levETIRAcetam 500 MG TABS: 500 | 30 days supply | Qty: 60 | Fill #9

## 2018-10-24 MED FILL — metFORMIN HCL 500 MG TABS: 500 | 30 days supply | Qty: 30 | Fill #1

## 2018-10-24 MED FILL — AMLODIPINE BESYLATE 10 MG T: 10 | 30 days supply | Qty: 30 | Fill #3

## 2018-11-09 ENCOUNTER — Ambulatory Visit: Payer: Self-pay | Admitting: Family Medicine

## 2018-11-16 ENCOUNTER — Ambulatory Visit: Payer: Self-pay | Attending: Family Medicine | Admitting: Pharmacist

## 2018-11-16 ENCOUNTER — Other Ambulatory Visit: Payer: Self-pay

## 2018-11-16 DIAGNOSIS — Z23 Encounter for immunization: Secondary | ICD-10-CM

## 2018-11-16 NOTE — Progress Notes (Signed)
Patient presents for vaccination against influenza per orders of Dr. Newlin. Consent given. Counseling provided. No contraindications exists. Vaccine administered without incident.   

## 2018-11-27 ENCOUNTER — Other Ambulatory Visit: Payer: Self-pay | Admitting: Family Medicine

## 2018-11-27 DIAGNOSIS — K219 Gastro-esophageal reflux disease without esophagitis: Secondary | ICD-10-CM

## 2018-11-27 MED FILL — AMLODIPINE BESYLATE 10 MG T: 10 | 30 days supply | Qty: 30 | Fill #4

## 2018-11-27 MED FILL — ATORVASTATIN CALCIUM 20 MG: 20 | 30 days supply | Qty: 30 | Fill #1

## 2018-11-27 MED FILL — cloNIDine HCL 0.1 MG TABS: 0.1 | 90 days supply | Qty: 90 | Fill #0

## 2018-11-27 MED FILL — MAGNESIUM OXIDE 400 MG TAB: 400 | 30 days supply | Qty: 30 | Fill #6

## 2018-11-27 MED FILL — PANTOPRAZOLE SOD DR 20 MG T: 20 | 30 days supply | Qty: 60 | Fill #0

## 2018-11-27 MED FILL — DICLOFENAC SOD EC 75 MG TAB: 75 | 30 days supply | Qty: 60 | Fill #1

## 2018-11-27 MED FILL — levETIRAcetam 500 MG TABS: 500 | 30 days supply | Qty: 60 | Fill #0

## 2018-11-27 MED FILL — metFORMIN HCL 500 MG TABS: 500 | 30 days supply | Qty: 30 | Fill #2

## 2018-11-30 ENCOUNTER — Ambulatory Visit: Payer: Self-pay | Attending: Family Medicine

## 2018-11-30 ENCOUNTER — Other Ambulatory Visit: Payer: Self-pay

## 2018-12-18 ENCOUNTER — Other Ambulatory Visit: Payer: Self-pay

## 2018-12-18 ENCOUNTER — Encounter: Payer: Self-pay | Admitting: Family Medicine

## 2018-12-18 ENCOUNTER — Ambulatory Visit: Payer: Self-pay | Attending: Family Medicine | Admitting: Family Medicine

## 2018-12-18 VITALS — BP 125/86 | HR 109 | Temp 98.2°F | Resp 18 | Ht 60.0 in | Wt 165.0 lb

## 2018-12-18 DIAGNOSIS — M25562 Pain in left knee: Secondary | ICD-10-CM

## 2018-12-18 DIAGNOSIS — E119 Type 2 diabetes mellitus without complications: Secondary | ICD-10-CM

## 2018-12-18 DIAGNOSIS — M25561 Pain in right knee: Secondary | ICD-10-CM

## 2018-12-18 DIAGNOSIS — K529 Noninfective gastroenteritis and colitis, unspecified: Secondary | ICD-10-CM

## 2018-12-18 LAB — GLUCOSE, POCT (MANUAL RESULT ENTRY): POC Glucose: 108 mg/dl — AB (ref 70–99)

## 2018-12-18 MED ORDER — PREDNISONE 20 MG PO TABS: 20.0000 mg | ORAL_TABLET | Freq: Two times a day (BID) | ORAL | 0 refills | Status: DC

## 2018-12-18 MED FILL — predniSONE 20 MG TABS: 20 | 5 days supply | Qty: 10 | Fill #0

## 2018-12-18 NOTE — Patient Instructions (Signed)

## 2018-12-18 NOTE — Progress Notes (Signed)
Patient complains of slight cramping after eating out on Sunday and having diarrhea since.

## 2018-12-18 NOTE — Progress Notes (Signed)
Subjective:  Patient ID: Christina Byrd, female    DOB: Jun 24, 1965  Age: 53 y.o. MRN: [id number]  CC: Hip Pain   HPI Christina Byrd  is a 53 year old female with a history of type 2 diabetes mellitus (A1c 5.8), GERD, Seizures who presents today for an acute visit complaining of joint pains which are migratory. Sometimes occur in her hips, at other times in her knees and ankles. It is usually more severe in her R ankle and is present today and is moderate to severe with associated swelling and symptoms are more on the medial aspect of her knee. These have been present for months and she thinks it could be from Gout. Currently on Diclofenac which provides some relief.  She has some vegetables from [place] [employer] yesterday and since then has had some nausea, diarrhea,  intermittent abdominal cramping but no fever.  Past Medical History:  Diagnosis Date  . GASTROESOPHAGEAL REFLUX, NO ESOPHAGITIS 04/21/2006   Qualifier: Diagnosis of  By: Benna Dunks    . GERD (gastroesophageal reflux disease) Dx 1995  . HTN (hypertension)     History reviewed. No pertinent surgical history.  Family History  Problem Relation Age of Onset  . CAD Mother     Allergies  Allergen Reactions  . Losartan Potassium     Rash  . Levaquin [Levofloxacin In D5w] Rash  . Ace Inhibitors Cough    REACTION: cough  . Codeine Nausea Only  . Cefepime Rash    09/04/12 pm Patient started to break out with small macules after IV Vanco infusion, then macules increased in size after starting cefepime infusion.  . Vancomycin Rash    09/04/12 pm Patient started to break out with small macules after IV Vanco infusion, then macules increased in size after starting cefepime infusion.    Outpatient Medications Prior to Visit  Medication Sig Dispense Refill  . amLODipine (NORVASC) 10 MG tablet Take 1 tablet (10 mg total) by mouth daily. 90 tablet 1  . aspirin EC 81 MG tablet Take 1 tablet (81 mg total) by mouth daily.     Marland Kitchen atorvastatin (LIPITOR) 20 MG tablet Take 1 tablet (20 mg total) by mouth daily. 90 tablet 1  . Blood Glucose Monitoring Suppl (CONTOUR NEXT MONITOR) w/Device KIT 1 kit by Does not apply route 3 (three) times daily. CHECK BLOOD SUGAR UP TO 3 TIMES DAILY. E11.9 1 kit 0  . carbamide peroxide (DEBROX) 6.5 % OTIC solution Place 3 drops into both ears 2 (two) times daily. 15 mL 0  . cetirizine (ZYRTEC) 10 MG tablet TAKE 1 TABLET (10 MG TOTAL) BY MOUTH DAILY. 30 tablet 1  . cloNIDine (CATAPRES) 0.1 MG tablet Take 1 tablet (0.1 mg total) by mouth at bedtime as needed. For hot flashes 90 tablet 1  . diclofenac (VOLTAREN) 75 MG EC tablet TAKE 1 TABLET (75 MG TOTAL) BY MOUTH 2 (TWO) TIMES DAILY. 60 tablet 1  . gabapentin (NEURONTIN) 300 MG capsule Take 1 capsule (300 mg total) by mouth at bedtime. 90 capsule 1  . glucose blood (CONTOUR NEXT TEST) test strip CHECK BLOOD SUGAR UP TO 3 TIMES DAILY. E11.9 100 each 12  . levETIRAcetam (KEPPRA) 500 MG tablet Take 1 tablet (500 mg total) by mouth 2 (two) times daily. 60 tablet 11  . magnesium oxide (MAG-OX) 400 MG tablet Take 1 tablet (400 mg total) by mouth daily. 90 tablet 3  . metFORMIN (GLUCOPHAGE) 500 MG tablet Take 1 tablet (500 mg total) by  mouth daily with breakfast. 90 tablet 1  . Microlet Lancets MISC CHECK BLOOD SUGAR UP TO 3 TIMES DAILY. E11.9 100 each 11  . pantoprazole (PROTONIX) 20 MG tablet TAKE 1 TABLET BY MOUTH 2 TIMES DAILY BEFORE A MEAL 60 tablet 2   No facility-administered medications prior to visit.      ROS Review of Systems  Constitutional: Negative for activity change, appetite change and fatigue.  HENT: Negative for congestion, sinus pressure and sore throat.   Eyes: Negative for visual disturbance.  Respiratory: Negative for cough, chest tightness, shortness of breath and wheezing.   Cardiovascular: Negative for chest pain and palpitations.  Gastrointestinal: Positive for abdominal pain, diarrhea and nausea. Negative for  abdominal distention, blood in stool, constipation and vomiting.  Endocrine: Negative for polydipsia.  Genitourinary: Negative for dysuria and frequency.  Musculoskeletal:       See HPI  Skin: Negative for rash.  Neurological: Negative for tremors, light-headedness and numbness.  Hematological: Does not bruise/bleed easily.  Psychiatric/Behavioral: Negative for agitation and behavioral problems.    Objective:  BP 125/86 (BP Location: Left Arm, Patient Position: Sitting, Cuff Size: Normal)   Pulse (!) 109   Temp 98.2 F (36.8 C) (Oral)   Resp 18   Ht 5' (1.524 m)   Wt 165 lb (74.8 kg)   LMP 12/17/2014   SpO2 100%   BMI 32.22 kg/m   BP/Weight 12/18/2018 11/14/3005 07/25/2631  Systolic BP 354 98 562  Diastolic BP 86 65 77  Wt. (Lbs) 165 168 168  BMI 32.22 31.74 31.74      Physical Exam Constitutional:      Appearance: She is well-developed.  Neck:     Vascular: No JVD.  Cardiovascular:     Rate and Rhythm: Normal rate.     Heart sounds: Normal heart sounds. No murmur.  Pulmonary:     Effort: Pulmonary effort is normal.     Breath sounds: Normal breath sounds. No wheezing or rales.  Chest:     Chest wall: No tenderness.  Abdominal:     General: Bowel sounds are normal. There is no distension.     Palpations: Abdomen is soft. There is no mass.     Tenderness: There is no abdominal tenderness.  Musculoskeletal:     Right lower leg: No edema.     Left lower leg: No edema.     Comments: Crepitus and tenderness on ROM of R knee; normal appearance of R knee. L knee, b/l hips and ankles with normal appearance and normal ROM  Neurological:     Mental Status: She is alert and oriented to person, place, and time.  Psychiatric:        Mood and Affect: Mood normal.     CMP Latest Ref Rng & Units 10/04/2018 05/01/2018 11/10/2017  Glucose 65 - 99 mg/dL 86 158(H) 126(H)  BUN 6 - 24 mg/dL '13 13 15  '$ Creatinine 0.57 - 1.00 mg/dL 0.95 0.93 0.93  Sodium 134 - 144 mmol/L 142 145(H)  140  Potassium 3.5 - 5.2 mmol/L 4.2 3.9 4.0  Chloride 96 - 106 mmol/L 103 104 104  CO2 20 - 29 mmol/L 21 17(L) 23  Calcium 8.7 - 10.2 mg/dL 8.3(L) 9.0 9.0  Total Protein 6.5 - 8.1 g/dL - - 7.4  Total Bilirubin 0.3 - 1.2 mg/dL - - 0.8  Alkaline Phos 38 - 126 U/L - - 86  AST 15 - 41 U/L - - 20  ALT 0 - 44 U/L - -  16    Lipid Panel     Component Value Date/Time   CHOL 161 06/22/2017 1041   TRIG 253 (H) 06/22/2017 1041   HDL 44 06/22/2017 1041   CHOLHDL 3.7 06/22/2017 1041   CHOLHDL 2.4 10/26/2012 1052   VLDL 46 (H) 10/26/2012 1052   LDLCALC 66 06/22/2017 1041   LDLDIRECT 153 (H) 01/30/2008 2146    CBC    Component Value Date/Time   WBC 8.7 11/10/2017 1144   RBC 4.36 11/10/2017 1144   HGB 12.8 11/10/2017 1144   HCT 39.6 11/10/2017 1144   PLT 197 11/10/2017 1144   MCV 90.8 11/10/2017 1144   MCH 29.4 11/10/2017 1144   MCHC 32.3 11/10/2017 1144   RDW 15.9 (H) 11/10/2017 1144   LYMPHSABS 1.2 11/10/2017 1144   MONOABS 0.5 11/10/2017 1144   EOSABS 0.1 11/10/2017 1144   BASOSABS 0.1 11/10/2017 1144    Lab Results  Component Value Date   HGBA1C 5.7 10/04/2018    Assessment & Plan:   1. Controlled type 2 diabetes mellitus without complication, without long-term current use of insulin (HCC) Controlled with A1c of 5.7 Continue current management - HgB A1c - Glucose (CBG)  2. Gastroenteritis Likely viral in nature Counseled on the BRAT diet Improving  3. Arthralgia of both knees Suspicious for Osteoarthritis Will attempt to rule out Gout. - Uric Acid - predniSONE (DELTASONE) 20 MG tablet; Take 1 tablet (20 mg total) by mouth 2 (two) times daily with a meal.  Dispense: 10 tablet; Refill: 0   Meds ordered this encounter  Medications  . predniSONE (DELTASONE) 20 MG tablet    Sig: Take 1 tablet (20 mg total) by mouth 2 (two) times daily with a meal.    Dispense:  10 tablet    Refill:  0    Follow-up: Return for Medical conditions, keep previously scheduled  appointment.       Charlott Rakes, MD, FAAFP. [facility] and [place], [place]   12/18/2018, 2:26 PM

## 2018-12-19 ENCOUNTER — Other Ambulatory Visit: Payer: Self-pay | Admitting: Family Medicine

## 2018-12-19 DIAGNOSIS — M1A0610 Idiopathic chronic gout, right knee, without tophus (tophi): Secondary | ICD-10-CM

## 2018-12-19 LAB — URIC ACID: Uric Acid: 10.9 mg/dL — ABNORMAL HIGH (ref 2.5–7.1)

## 2018-12-19 MED ORDER — ALLOPURINOL 300 MG PO TABS: 300.0000 mg | ORAL_TABLET | Freq: Every day | ORAL | 6 refills | Status: DC

## 2018-12-19 MED FILL — ALLOPURINOL 300 MG TAB: 300 | 30 days supply | Qty: 30 | Fill #0

## 2018-12-21 ENCOUNTER — Telehealth: Payer: Self-pay

## 2018-12-21 ENCOUNTER — Other Ambulatory Visit: Payer: Self-pay | Admitting: Family Medicine

## 2018-12-21 MED FILL — AMLODIPINE BESYLATE 10 MG T: 10 | 30 days supply | Qty: 30 | Fill #5

## 2018-12-21 MED FILL — levETIRAcetam 500 MG TABS: 500 | 30 days supply | Qty: 60 | Fill #1

## 2018-12-21 MED FILL — ATORVASTATIN CALCIUM 20 MG: 20 | 30 days supply | Qty: 30 | Fill #2

## 2018-12-21 MED FILL — MAGNESIUM OXIDE 400 MG TAB: 400 | 30 days supply | Qty: 30 | Fill #7

## 2018-12-21 MED FILL — PANTOPRAZOLE SOD DR 20 MG T: 20 | 30 days supply | Qty: 60 | Fill #1

## 2018-12-21 MED FILL — DICLOFENAC SOD EC 75 MG TAB: 75 | 30 days supply | Qty: 60 | Fill #0

## 2018-12-21 MED FILL — metFORMIN HCL 500 MG TABS: 500 | 30 days supply | Qty: 30 | Fill #3

## 2018-12-21 NOTE — Telephone Encounter (Signed)
Patient was called and a voicemail was left informing patient to return phone call for lab results. 

## 2018-12-21 NOTE — Telephone Encounter (Signed)
-----   Message from Enobong Newlin, MD sent at 12/19/2018  1:44 PM EDT ----- Labs reveal diagnosis of gout which could be causing her knee pain.  I have sent a prescription for allopurinol to her pharmacy which will help prevent gout flares.  Please advised to cut back on red meats, seafood and alcohol which could precipitate flares. 

## 2018-12-21 NOTE — Telephone Encounter (Signed)
-----   Message from Charlott Rakes, MD sent at 12/19/2018  1:44 PM EDT ----- Labs reveal diagnosis of gout which could be causing her knee pain.  I have sent a prescription for allopurinol to her pharmacy which will help prevent gout flares.  Please advised to cut back on red meats, seafood and alcohol which could precipitate flares.

## 2018-12-21 NOTE — Telephone Encounter (Signed)
Patient was called and informed of lab results and medication being sent to pharmacy. 

## 2019-01-01 ENCOUNTER — Ambulatory Visit: Payer: Self-pay

## 2019-01-08 ENCOUNTER — Ambulatory Visit: Payer: Self-pay

## 2019-01-08 ENCOUNTER — Other Ambulatory Visit: Payer: Self-pay

## 2019-01-15 ENCOUNTER — Other Ambulatory Visit: Payer: Self-pay

## 2019-01-15 ENCOUNTER — Encounter (HOSPITAL_COMMUNITY): Payer: Self-pay | Admitting: Emergency Medicine

## 2019-01-15 ENCOUNTER — Ambulatory Visit (HOSPITAL_COMMUNITY)
Admission: EM | Admit: 2019-01-15 | Discharge: 2019-01-15 | Disposition: A | Discharge status: Home / Self Care | Payer: BLUE CROSS/BLUE SHIELD | Attending: Family Medicine | Admitting: Family Medicine

## 2019-01-15 DIAGNOSIS — H6690 Otitis media, unspecified, unspecified ear: Secondary | ICD-10-CM | POA: Diagnosis not present

## 2019-01-15 MED ORDER — AMOXICILLIN 500 MG PO CAPS: 1000.0000 mg | ORAL_CAPSULE | Freq: Three times a day (TID) | ORAL | 0 refills | Status: AC

## 2019-01-15 MED FILL — AMOXICILLIN 500 MG CAPSULE: 500 | 5 days supply | Qty: 30 | Fill #0

## 2019-01-15 NOTE — ED Provider Notes (Signed)
[place]    CSN: [id number] Arrival date & time: 01/15/19  1106      History   Chief Complaint Chief Complaint  Patient presents with  . Otalgia    Appt 1110    HPI Christina Byrd is a 53 y.o. female.   Pt is a 53 year old female that presents with left ear pain, itching x 2 days. Symptoms have been constant and worsening. She was itching her ear with bobby pin. No swelling to the ear or drainage. No fever. Hx of ear infections in the past. No injury to the ear, rhinorrhea, nasal congestion, sore throat, cough. She has not done anything for the symptoms.   ROS per HPI      Past Medical History:  Diagnosis Date  . GASTROESOPHAGEAL REFLUX, NO ESOPHAGITIS 04/21/2006   Qualifier: Diagnosis of  By: Benna Dunks    . GERD (gastroesophageal reflux disease) Dx 1995  . HTN (hypertension)     Patient Active Problem List   Diagnosis Date Noted  . Hypomagnesemia 03/14/2018  . Type 2 diabetes mellitus ([place]) 09/13/2016  . Depression 07/08/2016  . Cocaine use   . Hot flash, menopausal 06/22/2016  . Intertrigo 06/22/2016  . Gout of left ankle 10/09/2015  . Left hip pain 09/02/2015  . EtOH dependence ([place]) 09/02/2015  . Colonoscopy refused 09/02/2015  . Hyperuricemia 05/27/2015  . 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 Pain and swelling of left ankle 12/02/2014  . Tendinitis of right hip flexor 07/23/2014  . Allergic rhinitis 05/13/2014  . GERD (gastroesophageal reflux disease) 04/01/2014  . Tendonitis, Achilles, right 04/01/2014  . Osteoarthritis of left knee 12/11/2013  . Ex-smoker 11/09/2008  . OBESITY 08/12/2006  . HYPERTENSION, BENIGN ESSENTIAL 08/12/2006    History reviewed. No pertinent surgical history.  OB History   No obstetric history on file.      Home Medications    Prior to Admission medications   Medication Sig Start Date End Date Taking? Authorizing Provider  allopurinol (ZYLOPRIM) 300 MG tablet Take 1 tablet (300  mg total) by mouth daily. 12/19/18  Yes Charlott Rakes, MD  amLODipine (NORVASC) 10 MG tablet Take 1 tablet (10 mg total) by mouth daily. 10/04/18  Yes Charlott Rakes, MD  aspirin EC 81 MG tablet Take 1 tablet (81 mg total) by mouth daily. 10/26/12  Yes Rai, Ripudeep K, MD  atorvastatin (LIPITOR) 20 MG tablet Take 1 tablet (20 mg total) by mouth daily. 10/04/18  Yes Newlin, Charlane Ferretti, MD  cetirizine (ZYRTEC) 10 MG tablet TAKE 1 TABLET (10 MG TOTAL) BY MOUTH DAILY. 10/04/18  Yes Charlott Rakes, MD  cloNIDine (CATAPRES) 0.1 MG tablet Take 1 tablet (0.1 mg total) by mouth at bedtime as needed. For hot flashes 10/04/18  Yes Charlott Rakes, MD  diclofenac (VOLTAREN) 75 MG EC tablet TAKE 1 TABLET (75 MG TOTAL) BY MOUTH 2 (TWO) TIMES DAILY. 12/21/18  Yes Charlott Rakes, MD  gabapentin (NEURONTIN) 300 MG capsule Take 1 capsule (300 mg total) by mouth at bedtime. 10/04/18  Yes Charlott Rakes, MD  levETIRAcetam (KEPPRA) 500 MG tablet Take 1 tablet (500 mg total) by mouth 2 (two) times daily. 07/10/18  Yes Cameron Sprang, MD  magnesium oxide (MAG-OX) 400 MG tablet Take 1 tablet (400 mg total) by mouth daily. 03/14/18  Yes Elsie Stain, MD  metFORMIN (GLUCOPHAGE) 500 MG tablet Take 1 tablet (500 mg total) by mouth daily with breakfast. 10/04/18  Yes Charlott Rakes, MD  pantoprazole (  PROTONIX) 20 MG tablet TAKE 1 TABLET BY MOUTH 2 TIMES DAILY BEFORE A MEAL 11/27/18  Yes Newlin, Enobong, MD  amoxicillin (AMOXIL) 500 MG capsule Take 2 capsules (1,000 mg total) by mouth 3 (three) times daily for 5 days. 01/15/19 01/20/19  Loura Halt A, NP  Blood Glucose Monitoring Suppl (CONTOUR NEXT MONITOR) w/Device KIT 1 kit by Does not apply route 3 (three) times daily. CHECK BLOOD SUGAR UP TO 3 TIMES DAILY. E11.9 06/09/18   Charlott Rakes, MD  glucose blood (CONTOUR NEXT TEST) test strip CHECK BLOOD SUGAR UP TO 3 TIMES DAILY. E11.9 06/09/18   Charlott Rakes, MD  Microlet Lancets MISC CHECK BLOOD SUGAR UP TO 3 TIMES DAILY. E11.9  06/09/18   Charlott Rakes, MD    Family History Family History  Problem Relation Age of Onset  . CAD Mother     Social History Social History   Tobacco Use  . Smoking status: Former Smoker    Quit date: 06/25/2012    Years since quitting: 6.5  . Smokeless tobacco: Never Used  Substance Use Topics  . Alcohol use: Yes    Alcohol/week: 3.0 standard drinks    Types: 3 Standard drinks or equivalent per week    Comment: ADMITS TO DRINKING 3-4 BEERS/DAY  . Drug use: No     Allergies   Losartan potassium, Levaquin [levofloxacin in d5w], Ace inhibitors, Codeine, Cefepime, and Vancomycin   Review of Systems Review of Systems   Physical Exam Triage Vital Signs ED Triage Vitals  Enc Vitals Group     BP 01/15/19 1126 138/83     Pulse Rate 01/15/19 1126 (!) 117     Resp 01/15/19 1126 18     Temp 01/15/19 1126 99 F (37.2 C)     Temp Source 01/15/19 1126 Oral     SpO2 01/15/19 1126 98 %     Weight --      Height --      Head Circumference --      Peak Flow --      Pain Score 01/15/19 1123 8     Pain Loc --      Pain Edu? --      Excl. in [place]? --    No data found.  Updated Vital Signs BP 138/83 (BP Location: Left Arm)   Pulse (!) 117   Temp 99 F (37.2 C) (Oral)   Resp 18   LMP 12/17/2014   SpO2 98%   Visual Acuity Right Eye Distance:   Left Eye Distance:   Bilateral Distance:    Right Eye Near:   Left Eye Near:    Bilateral Near:     Physical Exam Vitals signs and nursing note reviewed.  Constitutional:      General: She is not in acute distress.    Appearance: Normal appearance. She is not ill-appearing, toxic-appearing or diaphoretic.  HENT:     Head: Normocephalic and atraumatic.     Right Ear: Tympanic membrane and ear canal normal.     Left Ear: No mastoid tenderness. Tympanic membrane is erythematous and bulging.     Nose: Nose normal.     Mouth/Throat:     Pharynx: Oropharynx is clear.  Eyes:     Conjunctiva/sclera: Conjunctivae normal.   Neck:     Musculoskeletal: Normal range of motion.  Pulmonary:     Effort: Pulmonary effort is normal.  Musculoskeletal: Normal range of motion.  Skin:    General: Skin is warm and dry.  Findings: No rash.  Neurological:     Mental Status: She is alert.  Psychiatric:        Mood and Affect: Mood normal.      UC Treatments / Results  Labs (all labs ordered are listed, but only abnormal results are displayed) Labs Reviewed - No data to display  EKG   Radiology No results found.  Procedures Procedures (including critical care time)  Medications Ordered in UC Medications - No data to display  Initial Impression / Assessment and Plan / UC Course  I have reviewed the triage vital signs and the nursing notes.  Pertinent labs & imaging results that were available during my care of the patient were reviewed by me and considered in my medical decision making (see chart for details).     Left otitis media- treating with amoxicillin.  Aleve for pain.  Follow up as needed for continued or worsening symptoms  Final Clinical Impressions(s) / UC Diagnoses   Final diagnoses:  Acute otitis media, unspecified otitis media type     Discharge Instructions     Treating you for an ear infection Take the amoxicillin 3 times a day for 5 days. You can continue the Aleve for pain Follow up as needed for continued or worsening symptoms     ED Prescriptions    Medication Sig Dispense Auth. Provider   amoxicillin (AMOXIL) 500 MG capsule Take 2 capsules (1,000 mg total) by mouth 3 (three) times daily for 5 days. 30 capsule Jamelle Goldston A, NP     PDMP not reviewed this encounter.   Orvan July, NP 01/17/19 1539

## 2019-01-15 NOTE — Discharge Instructions (Signed)
Treating you for an ear infection Take the amoxicillin 3 times a day for 5 days. You can continue the Aleve for pain Follow up as needed for continued or worsening symptoms

## 2019-01-15 NOTE — ED Triage Notes (Signed)
Pt reports that her left ear began itching 2-3 days ago.  She states she tried scratching it with a bobby pin and then her finger.  Pt states since then she is having a lot of pain around the entire ear.

## 2019-01-23 MED FILL — AMLODIPINE BESYLATE 10 MG T: 10 | 90 days supply | Qty: 90 | Fill #0

## 2019-01-23 MED FILL — levETIRAcetam 500 MG TABS: 500 | 30 days supply | Qty: 60 | Fill #2

## 2019-01-23 MED FILL — MAGNESIUM OXIDE 400 MG TAB: 400 | 30 days supply | Qty: 30 | Fill #8

## 2019-01-23 MED FILL — DICLOFENAC SOD EC 75 MG TAB: 75 | 30 days supply | Qty: 60 | Fill #1

## 2019-01-23 MED FILL — ATORVASTATIN CALCIUM 20 MG: 20 | 30 days supply | Qty: 30 | Fill #3

## 2019-01-23 MED FILL — PANTOPRAZOLE SOD DR 20 MG T: 20 | 30 days supply | Qty: 60 | Fill #2

## 2019-01-23 MED FILL — metFORMIN HCL 500 MG TABS: 500 | 30 days supply | Qty: 30 | Fill #4

## 2019-01-26 ENCOUNTER — Telehealth: Payer: Self-pay

## 2019-01-26 NOTE — Telephone Encounter (Signed)
All her visits with me have been in person. We can have a televisit for her symptoms. Thanks.

## 2019-01-26 NOTE — Telephone Encounter (Signed)
Patient was called to be informed of televisit and patient requesting for appointment top be canceled.

## 2019-01-26 NOTE — Telephone Encounter (Signed)
Patient was called to be informed about her televist on Monday. Patient is refusing to take the televist appointment, patient is requesting to be seen in person due to hip, lower abdomen pain and itching in ears.  Patient was seen in ED on 11/23 and she states that medication broke her face out and did not work.

## 2019-01-29 ENCOUNTER — Ambulatory Visit: Payer: Self-pay | Admitting: Family Medicine

## 2019-02-09 ENCOUNTER — Encounter: Payer: Self-pay | Admitting: Neurology

## 2019-02-21 ENCOUNTER — Other Ambulatory Visit: Payer: Self-pay | Admitting: Family Medicine

## 2019-02-21 DIAGNOSIS — K219 Gastro-esophageal reflux disease without esophagitis: Secondary | ICD-10-CM

## 2019-02-21 MED FILL — PANTOPRAZOLE SOD DR 20 MG T: 20 | 30 days supply | Qty: 60 | Fill #0

## 2019-02-21 MED FILL — MAGNESIUM OXIDE 400 MG TAB: 400 | 30 days supply | Qty: 30 | Fill #9

## 2019-02-21 MED FILL — ATORVASTATIN CALCIUM 20 MG: 20 | 30 days supply | Qty: 30 | Fill #4

## 2019-02-21 MED FILL — levETIRAcetam 500 MG TABS: 500 | 30 days supply | Qty: 60 | Fill #3

## 2019-02-21 MED FILL — metFORMIN HCL 500 MG TABS: 500 | 30 days supply | Qty: 30 | Fill #5

## 2019-02-21 MED FILL — cloNIDine HCL 0.1 MG TABS: 0.1 | 90 days supply | Qty: 90 | Fill #1

## 2019-02-26 ENCOUNTER — Telehealth: Payer: Self-pay | Admitting: Neurology

## 2019-02-26 ENCOUNTER — Other Ambulatory Visit: Payer: Self-pay

## 2019-02-26 ENCOUNTER — Telehealth (INDEPENDENT_AMBULATORY_CARE_PROVIDER_SITE_OTHER): Payer: BLUE CROSS/BLUE SHIELD | Admitting: Neurology

## 2019-02-26 ENCOUNTER — Encounter: Payer: Self-pay | Admitting: Neurology

## 2019-02-26 VITALS — Ht 62.0 in | Wt 165.0 lb

## 2019-02-26 DIAGNOSIS — G40209 Localization-related (focal) (partial) symptomatic epilepsy and epileptic syndromes with complex partial seizures, not intractable, without status epilepticus: Secondary | ICD-10-CM

## 2019-02-26 MED ORDER — LEVETIRACETAM 500 MG PO TABS: 500.0000 mg | ORAL_TABLET | Freq: Two times a day (BID) | ORAL | 11 refills | Status: DC

## 2019-02-26 NOTE — Progress Notes (Signed)
Virtual Visit via Video Note The purpose of this virtual visit is to provide medical care while limiting exposure to the novel coronavirus.    Consent was obtained for video visit:  Yes.   Answered questions that patient had about telehealth interaction:  Yes.   I discussed the limitations, risks, security and privacy concerns of performing an evaluation and management service by telemedicine. I also discussed with the patient that there may be a patient responsible charge related to this service. The patient expressed understanding and agreed to proceed.  Pt location: Home Physician Location: office Name of referring provider:  Charlott Rakes, MD I connected with [name] at patients initiation/request on 02/26/2019 at  3:30 PM EST by video enabled telemedicine application and verified that I am speaking with the correct person using two identifiers. Pt MRN:  [id number] Pt DOB:  04/15/1965 Video Participants:  [name]   History of Present Illness:  The patient was seen as a virtual video visit on 02/26/2019. 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 She denies any seizures since May 2020, at that time she had missed her dose of Levetiracetam and had more alcohol than usual. She denies any staring/unresponsive episodes, gaps in time, convulsive activity. She has occasional episodes of olfactory hallucinations with a stinking smell lasting a few minutes, no associated confusion. She denies any side effects on Levetiracetam 500mg  BID. She denies any headaches, dizziness, no falls. She feels a little off balance sometimes. She has occasional numbness in the toes of her left foot after she walks for prolonged periods. Sleep is good, she has occasional hot flashes at night. Mood is good. She drinks 1 beer and 4 shots a day, stating  this is much less than before. She does not drive.  History on Initial Assessment 11/18/2017: This is a 54 year old right-handed woman with a history of alcohol abuse, diabetes, hypertension, presenting for evaluation of recurrent seizures. The first seizure occurred in May 2018, she had a seizure in her sleep, then had another one before EMS arrived. Her significant other states she was in a daze and not responding in between seizures, then had another en route to the hospital. Per ER notes, she had 4 seizures that day. No focal weakness. Her CK was elevated, UDS positive for cocaine. 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 EEG normal. 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 She was seizure-free for almost a year until July 2019 when she had another convulsion. She denies any prior warning symptoms, waking up in the ambulance. At that time, her potassium and magnesium levels were very low. UDS negative. She was discharged home on Keppra but had not been taking it because she would have nausea and vomiting taking it together with her other medications. She had a third seizure on 11/10/2017, her significant other reports that this also occurred in sleep, she had heavy breathing with urinary incontinence. She would be confused after, no focal weakness. She was discharged home on Keppra 500mg  BID which she is tolerating without side effects now that she is taking medications separately. She has occasional episodes where she smells an odd/terrible smell like blood or trash. Her significant other denies any staring/unresponsive episodes except in between the seizures. She denies any  gaps in time, rising epigastric sensation, focal numbness/tingling/weakness, deja vu sensations, or myoclonic jerks. She has a history of heavy alcohol use, she usually drinks 2 beers and 4 shots of liquor every night, at most 3 12-oz beers and 6 shots of liquor. She  recalls that the night prior to the seizures she had more alcohol and was up until 3am. She denies any headaches, dizziness, vision changes, neck/back pain, bowel/bladder dysfunction except for occasional constipation. Her father had seizures due to alcohol. Otherwise she had a normal birth and early development.  There is no history of febrile convulsions, CNS infections such as meningitis/encephalitis, significant traumatic brain injury, neurosurgical procedures.   Observations/Objective:   Vitals:   02/26/19 1106  Weight: 165 lb (74.8 kg)  Height: 5\' 2"  (1.575 m)   GEN:  The patient appears stated age and is in NAD. Neurological examination: Patient is awake, alert, oriented x 3. No aphasia or dysarthria. Intact fluency and comprehension. Remote and recent memory intact. Able to name and repeat. Cranial nerves: Extraocular movements intact with no nystagmus. No facial asymmetry. Motor: moves all extremities symmetrically, at least anti-gravity x 4. No incoordination on finger to nose testing.    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 She denies any seizures since May 2020, no side effects on Levetiracetam 500mg  BID. We again discussed avoidance of seizure triggers, including cutting down/stopping alcohol use. She is aware of [place] driving laws to stop driving after a seizure, until 6 months seizure-free. She will follow-up in 6 months and knows to call for any changes.    Follow Up Instructions:   -I discussed the assessment and treatment plan with the patient. The patient was provided an opportunity to ask questions and all were answered. The patient agreed with the plan and demonstrated an understanding of the instructions.   The patient was advised to call back or seek an in-person evaluation if the symptoms worsen or if the  condition fails to improve as anticipated.    11-25-1976, MD

## 2019-02-26 NOTE — Telephone Encounter (Signed)
Patient just saw Dr Karel Jarvis today but forgot to find out if she could drive. Please call her and let her know

## 2019-02-27 ENCOUNTER — Other Ambulatory Visit: Payer: Self-pay | Admitting: Pharmacist

## 2019-02-27 DIAGNOSIS — E119 Type 2 diabetes mellitus without complications: Secondary | ICD-10-CM

## 2019-02-27 MED ORDER — TRUE METRIX BLOOD GLUCOSE TEST VI STRP: ORAL_STRIP | 12 refills | Status: DC

## 2019-02-27 MED ORDER — TRUEPLUS LANCETS 28G MISC: 11 refills | Status: DC

## 2019-02-27 MED ORDER — TRUE METRIX METER DEVI: 1.0000 | Freq: Three times a day (TID) | 0 refills | Status: DC

## 2019-02-27 MED FILL — TRUEplus LANCETS 28G MISC: 30 days supply | Qty: 100 | Fill #0

## 2019-02-27 MED FILL — TRUE METRIX TEST STRIP: 25 days supply | Qty: 100 | Fill #0

## 2019-02-27 MED FILL — !TRUE METRIX BLOOD GLUCOSE: 365 days supply | Qty: 1 | Fill #0

## 2019-02-27 MED FILL — levETIRAcetam 500 MG TABS: 500 | 30 days supply | Qty: 60 | Fill #0

## 2019-02-28 ENCOUNTER — Telehealth: Payer: Self-pay | Admitting: Neurology

## 2019-02-28 NOTE — Telephone Encounter (Signed)
Patient called in needing to see if she will be able to drive? She said that she forgot to ask about it at her last visit. Please Call. Thank you

## 2019-02-28 NOTE — Telephone Encounter (Signed)
That is correct. The DMV requests that a form be filled out by the doctor about her seizures and that she has not had any seizures since May 2020, then they will let her know about return to driving, but yes it is 6 months seizure-free in [place]. Thanks

## 2019-02-28 NOTE — Telephone Encounter (Signed)
Instructed pt that per [place]  driving laws she would need to be 6 months seizure free. Pt states that she has been without a seizure since before May 2020.  Dr.Aquino,  Do you need to make the pt aware of anything?

## 2019-03-01 ENCOUNTER — Telehealth: Payer: Self-pay | Admitting: Family Medicine

## 2019-03-01 NOTE — Telephone Encounter (Signed)
I called the PT and Pt confirmed that she has BC/BS and also now has [facility], for this reason I told her that she is not qualified for the CAFA

## 2019-03-05 NOTE — Telephone Encounter (Signed)
Tried calling pt. No answer, no vmail.

## 2019-03-05 NOTE — Telephone Encounter (Signed)
Patient returned call to Ashley  

## 2019-03-05 NOTE — Telephone Encounter (Signed)
Pls let her know that the DMV has forms that we fill out and they are the one who will give the go signal for driving. It is usually 6 months seizure-free, so we will write on the form that last seizure was in May 2020. Majority of the time, they let patients drive, but they are the ones to decide. Thanks

## 2019-03-05 NOTE — Telephone Encounter (Signed)
Dr. Karel Jarvis, I will call the pt but the last OV states that her last seizure was in May of 2020. Will she need any documentation confirming that so she is able to drive?

## 2019-03-05 NOTE — Telephone Encounter (Signed)
Pt does not have computer and does not know how to use one. Wants to know if we can print it off and she can come by and pick it up. Instructed that I would look in to it and call her back. She or her husband would come by and pick up form.

## 2019-03-05 NOTE — Telephone Encounter (Signed)
Patient is needing to speak to someone about her driving please call patient

## 2019-03-06 NOTE — Telephone Encounter (Signed)
Forms printed from [facility] website and placed on Dr. Rosalyn Gess desk for review. Will call pt when complete.

## 2019-03-19 ENCOUNTER — Other Ambulatory Visit: Payer: Self-pay | Admitting: Critical Care Medicine

## 2019-03-19 ENCOUNTER — Other Ambulatory Visit: Payer: Self-pay | Admitting: Family Medicine

## 2019-03-19 ENCOUNTER — Ambulatory Visit: Payer: Medicaid Other | Admitting: Pharmacist

## 2019-03-19 MED FILL — ATORVASTATIN CALCIUM 20 MG: 20 | 30 days supply | Qty: 30 | Fill #5

## 2019-03-19 MED FILL — AMLODIPINE BESYLATE 10 MG T: 10 | 90 days supply | Qty: 90 | Fill #1

## 2019-03-19 MED FILL — PANTOPRAZOLE SOD DR 20 MG T: 20 | 30 days supply | Qty: 60 | Fill #1

## 2019-03-19 MED FILL — levETIRAcetam 500 MG TABS: 500 | 30 days supply | Qty: 60 | Fill #4

## 2019-03-19 MED FILL — metFORMIN HCL 500 MG TABS: 500 | 30 days supply | Qty: 30 | Fill #1

## 2019-03-20 MED FILL — MAGNESIUM OXIDE 400 MG TAB: 400 | 30 days supply | Qty: 30 | Fill #0

## 2019-03-20 MED FILL — DICLOFENAC SOD EC 75 MG TAB: 75 | 30 days supply | Qty: 60 | Fill #0

## 2019-03-28 ENCOUNTER — Ambulatory Visit: Payer: Self-pay | Attending: Family Medicine | Admitting: Pharmacist

## 2019-03-28 ENCOUNTER — Other Ambulatory Visit: Payer: Self-pay

## 2019-03-28 ENCOUNTER — Telehealth: Payer: Self-pay | Admitting: Family Medicine

## 2019-03-28 DIAGNOSIS — Z7189 Other specified counseling: Secondary | ICD-10-CM

## 2019-03-28 MED FILL — DICLOFENAC SOD EC 75 MG TAB: 75 | 30 days supply | Qty: 60 | Fill #0

## 2019-03-28 NOTE — Progress Notes (Signed)
Patient was educated on the use of the Contour blood glucose meter. Reviewed necessary supplies and operation of the meter. Also reviewed goal blood glucose levels. Patient was able to demonstrate use. All questions and concerns were addressed.

## 2019-03-28 NOTE — Telephone Encounter (Signed)
Patient requested for some information on why her medications cost has increased. Patient stated she wanted to speak with

## 2019-03-30 ENCOUNTER — Telehealth: Payer: Self-pay

## 2019-03-30 NOTE — Telephone Encounter (Signed)
Pt called informed that DMV paper work for Dr Aquino's part is done she needs to take it to her PCP for her Diabetes to have them fill it out and have them fax to the [facility]. Pt stated that she will come by Monday and pick up her paper work.

## 2019-04-02 MED FILL — AMLODIPINE BESYLATE 10 MG T: 10 | 30 days supply | Qty: 30 | Fill #1

## 2019-04-02 MED FILL — ?ATORVASTATIN 20 MG TABLET: 20 | 30 days supply | Qty: 30 | Fill #5

## 2019-04-02 MED FILL — ?METFORMIN HCL 500MG TABLET: 500 | 30 days supply | Qty: 30 | Fill #1

## 2019-04-02 MED FILL — PANTOPRAZOLE SOD DR 20 MG T: 20 | 30 days supply | Qty: 60 | Fill #1

## 2019-04-02 MED FILL — DICLOFENAC SOD EC 75 MG TAB: 75 | 30 days supply | Qty: 60 | Fill #0

## 2019-04-02 MED FILL — levETIRAcetam 500 MG TABS: 500 | 30 days supply | Qty: 60 | Fill #4

## 2019-04-02 MED FILL — TRUE METRIX TEST STRIP: 25 days supply | Qty: 100 | Fill #0

## 2019-04-02 MED FILL — MAGNESIUM OXIDE 400 MG TAB: 400 | 30 days supply | Qty: 30 | Fill #0

## 2019-04-02 MED FILL — TRUEplus LANCETS 28G MISC: 30 days supply | Qty: 100 | Fill #0

## 2019-04-05 ENCOUNTER — Telehealth: Payer: Self-pay

## 2019-04-05 NOTE — Telephone Encounter (Signed)
Patient was called and informed of paperwork being ready for pick up. 

## 2019-04-12 ENCOUNTER — Ambulatory Visit: Payer: Self-pay | Admitting: Cardiovascular Disease

## 2019-04-16 NOTE — Progress Notes (Signed)
CARDIOLOGY CONSULT NOTE       Patient ID: Christina Byrd MRN: [id number] DOB/AGE: 1965-05-27 54 y.o.  Admit date: (Not on file) Referring Physician: Margarita Rana Primary Physician: Charlott Rakes, MD Primary Cardiologist: Johnsie Cancel Reason for Consultation: CHF  Active Problems:   * No active hospital problems. *   HPI:  54 y.o. not seen in 3 years. History of HTN, smoking, GERD and ETOH abuse  Chronic systolic CHF dating back to 2014 echo then showing EF 20-25% She was supposed to have right and left cath 02/2013 but had no insurance Had cough with ACE and facial pruritis with Cozaar In 2016 she was tried on hydralazine and imdur which she stopped due to headaches EF has improved and by most recent echo 03/08/16 was normal at 55-60% She quit smoking in 2015 Has tolerated HCTZ and coreg in past Currently diagnosed with DM BP being Rx with norvasc and clonidine . 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 GASTROESOPHAGEAL REFLUX, NO ESOPHAGITIS 04/21/2006   Qualifier: Diagnosis of  By: Benna Dunks    . GERD (gastroesophageal reflux disease) Dx 1995  . HTN (hypertension)     Family History  Problem Relation Age of Onset  . CAD Mother     Social History   Socioeconomic History  . Marital status: Single    Spouse name: Not on file  . Number of children: Not on file  . Years of education: Not on file  . Highest education level: Not on file  Occupational History  . Not on file  Tobacco Use  . Smoking status: Former Smoker    Quit date: 06/25/2012    Years since quitting: 6.8  . Smokeless  tobacco: Never Used  Substance and Sexual Activity  . Alcohol use: Yes    Alcohol/week: 3.0 standard drinks    Types: 3 Standard drinks or equivalent per week    Comment: ADMITS TO DRINKING 3-4 BEERS/DAY  . Drug use: No  . Sexual activity: Not Currently  Other Topics Concern  . Not on file  Social History Narrative   Pt lives in single story home with her partner   Has 1 son   5 grandchildren   10th grade education   Works at [employer]    Social Determinants of Health   Financial Resource Strain:   . Difficulty of Paying Living Expenses: Not on file  Food Insecurity:   . Worried About Charity fundraiser in the Last Year: Not on file  . Ran Out of Food in the Last Year: Not on file  Transportation Needs:   . Lack of Transportation (Medical): Not on file  . Lack of Transportation (Non-Medical): Not on file  Physical Activity:   . Days of Exercise per Week: Not on file  . Minutes of Exercise per Session: Not on file  Stress:   . Feeling of Stress : Not on file  Social Connections:   . Frequency of Communication with Friends and Family: Not on file  . Frequency of Social Gatherings with Friends and  Family: Not on file  . Attends Religious Services: Not on file  . Active Member of Clubs or Organizations: Not on file  . Attends Archivist Meetings: Not on file  . Marital Status: Not on file  Intimate Partner Violence:   . Fear of Current or Ex-Partner: Not on file  . Emotionally Abused: Not on file  . Physically Abused: Not on file  . Sexually Abused: Not on file    No past surgical history on file.    Current Outpatient Medications:  .  allopurinol (ZYLOPRIM) 300 MG tablet, Take 1 tablet (300 mg total) by mouth daily., Disp: 30 tablet, Rfl: 6 .  amLODipine (NORVASC) 10 MG tablet, Take 1 tablet (10 mg total) by mouth daily., Disp: 90 tablet, Rfl: 1 .  aspirin EC 81 MG tablet, Take 1 tablet (81 mg total) by mouth daily., Disp: , Rfl:  .  atorvastatin  (LIPITOR) 20 MG tablet, Take 1 tablet (20 mg total) by mouth daily., Disp: 90 tablet, Rfl: 1 .  Blood Glucose Monitoring Suppl (TRUE METRIX METER) DEVI, 1 kit by Does not apply route 3 (three) times daily. CHECK BLOOD SUGAR UP TO 3 TIMES DAILY. E11.9, Disp: 1 each, Rfl: 0 .  cetirizine (ZYRTEC) 10 MG tablet, TAKE 1 TABLET (10 MG TOTAL) BY MOUTH DAILY., Disp: 30 tablet, Rfl: 1 .  cloNIDine (CATAPRES) 0.1 MG tablet, Take 1 tablet (0.1 mg total) by mouth at bedtime as needed. For hot flashes, Disp: 90 tablet, Rfl: 1 .  diclofenac (VOLTAREN) 75 MG EC tablet, TAKE 1 TABLET (75 MG TOTAL) BY MOUTH 2 (TWO) TIMES DAILY., Disp: 60 tablet, Rfl: 1 .  glucose blood (TRUE METRIX BLOOD GLUCOSE TEST) test strip, Use as instructed, Disp: 100 each, Rfl: 12 .  levETIRAcetam (KEPPRA) 500 MG tablet, Take 1 tablet (500 mg total) by mouth 2 (two) times daily., Disp: 60 tablet, Rfl: 11 .  magnesium oxide (MAG-OX) 400 MG tablet, TAKE 1 TABLET (400 MG TOTAL) BY MOUTH DAILY., Disp: 30 tablet, Rfl: 3 .  metFORMIN (GLUCOPHAGE) 500 MG tablet, Take 1 tablet (500 mg total) by mouth daily with breakfast., Disp: 90 tablet, Rfl: 1 .  pantoprazole (PROTONIX) 20 MG tablet, TAKE 1 TABLET BY MOUTH 2 TIMES DAILY BEFORE A MEAL, Disp: 60 tablet, Rfl: 2 .  TRUEplus Lancets 28G MISC, CHECK BLOOD SUGAR UP TO 3 TIMES DAILY. E11.9, Disp: 100 each, Rfl: 11    Physical Exam: Blood pressure 114/82, pulse 95, height '5\' 2"'$  (1.575 m), weight 162 lb (73.5 kg), last menstrual period 12/17/2014, SpO2 98 %.    Affect appropriate Healthy:  appears stated age 48: normal Neck supple with no adenopathy JVP normal no bruits no thyromegaly Lungs clear with no wheezing and good diaphragmatic motion Heart:  S1/S2 no murmur, no rub, gallop or click PMI normal Abdomen: benighn, BS positve, no tenderness, no AAA no bruit.  No HSM or HJR Distal pulses intact with no bruits No edema Neuro non-focal Skin warm and dry No muscular weakness   Labs:    Lab Results  Component Value Date   WBC 8.7 11/10/2017   HGB 12.8 11/10/2017   HCT 39.6 11/10/2017   MCV 90.8 11/10/2017   PLT 197 11/10/2017   No results for input(s): NA, K, CL, CO2, BUN, CREATININE, CALCIUM, PROT, BILITOT, ALKPHOS, ALT, AST, GLUCOSE in the last 168 hours.  Invalid input(s): LABALBU Lab Results  Component Value Date   JKDTOIZ 124 (H) 09/06/2017   CKMBINDEX <1.0 07/23/2016  TROPONINI <0.03 07/09/2016    Lab Results  Component Value Date   CHOL 161 06/22/2017   CHOL 194 10/26/2012   Lab Results  Component Value Date   HDL 44 06/22/2017   HDL 82 10/26/2012   Lab Results  Component Value Date   LDLCALC 66 06/22/2017   LDLCALC 66 10/26/2012   Lab Results  Component Value Date   TRIG 253 (H) 06/22/2017   TRIG 230 (H) 10/26/2012   Lab Results  Component Value Date   CHOLHDL 3.7 06/22/2017   CHOLHDL 2.4 10/26/2012   Lab Results  Component Value Date   LDLDIRECT 153 (H) 01/30/2008   LDLDIRECT 133 (H) 08/12/2006      Radiology: No results found.  EKG: SR rate 99 poor R wave progression 11/10/17 04/15/19 SR rate 95 nonspecific ST changes    ASSESSMENT AND PLAN:   1. DCM:  History of with normalization EF by TTE 03/08/16 will update Rx limited by side effects of meds 2.  HTN:  Well controlled.  Continue current medications and low sodium Dash type diet.   3. DM:  Discussed low carb diet.  Target hemoglobin A1c is 6.5 or less.  Continue current medications. 4. Seizures:  F/U neurology Dr Delice Lesch continue Keppra discussed ETOH as trigger no seizures over a year  5. ETOH: see above also concern for DCM related to alcohol discussed moderation and long term effects   Signed: Jenkins Rouge 04/18/2019, 3:16 PM

## 2019-04-18 ENCOUNTER — Other Ambulatory Visit: Payer: Self-pay

## 2019-04-18 ENCOUNTER — Ambulatory Visit: Payer: Self-pay | Admitting: Cardiovascular Disease

## 2019-04-18 ENCOUNTER — Encounter: Payer: Self-pay | Admitting: Cardiovascular Disease

## 2019-04-18 VITALS — BP 114/82 | HR 95 | Ht 62.0 in | Wt 162.0 lb

## 2019-04-18 DIAGNOSIS — I429 Cardiomyopathy, unspecified: Secondary | ICD-10-CM

## 2019-04-18 NOTE — Patient Instructions (Addendum)
Medication Instructions:   *If you need a refill on your cardiac medications before your next appointment, please call your pharmacy*  Lab Work:  If you have labs (blood work) drawn today and your tests are completely normal, you will receive your results only by: . MyChart Message (if you have MyChart) OR . A paper copy in the mail If you have any lab test that is abnormal or we need to change your treatment, we will call you to review the results.  Testing/Procedures: Your physician has requested that you have an echocardiogram. Echocardiography is a painless test that uses sound waves to create images of your heart. It provides your doctor with information about the size and shape of your heart and how well your heart's chambers and valves are working. This procedure takes approximately one hour. There are no restrictions for this procedure.  Follow-Up: At [facility], you and your health needs are our priority.  As part of our continuing mission to provide you with exceptional heart care, we have created designated Provider Care Teams.  These Care Teams include your primary Cardiologist (physician) and Advanced Practice Providers (APPs -  Physician Assistants and Nurse Practitioners) who all work together to provide you with the care you need, when you need it.  Your next appointment:   12 month(s)  The format for your next appointment:   In Person  Provider:   You may see Dr. Nishan or one of the following Advanced Practice Providers on your designated Care Team:    Lori Gerhardt, NP  Laura Ingold, NP  Jill McDaniel, NP  

## 2019-04-24 ENCOUNTER — Other Ambulatory Visit: Payer: Self-pay

## 2019-04-24 ENCOUNTER — Ambulatory Visit: Payer: Self-pay | Attending: Family Medicine | Admitting: Family Medicine

## 2019-04-24 ENCOUNTER — Encounter: Payer: Self-pay | Admitting: Family Medicine

## 2019-04-24 VITALS — BP 108/76 | HR 89 | Ht 62.0 in | Wt 161.0 lb

## 2019-04-24 DIAGNOSIS — Z124 Encounter for screening for malignant neoplasm of cervix: Secondary | ICD-10-CM

## 2019-04-24 DIAGNOSIS — E119 Type 2 diabetes mellitus without complications: Secondary | ICD-10-CM

## 2019-04-24 DIAGNOSIS — Z1231 Encounter for screening mammogram for malignant neoplasm of breast: Secondary | ICD-10-CM

## 2019-04-24 DIAGNOSIS — Z0000 Encounter for general adult medical examination without abnormal findings: Secondary | ICD-10-CM

## 2019-04-24 LAB — GLUCOSE, POCT (MANUAL RESULT ENTRY): POC Glucose: 122 mg/dl — AB (ref 70–99)

## 2019-04-24 NOTE — Progress Notes (Signed)
Subjective:  Patient ID: Christina Byrd, female    DOB: September 26, 1965  Age: 54 y.o. MRN: [id number]  CC: Annual Exam   HPI Christina Byrd presents for an annual physical exam. She has declined Pap smears at her previous visit but after much persuasion is willing to proceed today.  She is unable to recall the date of her last mammogram she complains she had breast pain during the procedure and vowed never to do it again.  Past Medical History:  Diagnosis Date  . GASTROESOPHAGEAL REFLUX, NO ESOPHAGITIS 04/21/2006   Qualifier: Diagnosis of  By: Benna Dunks    . GERD (gastroesophageal reflux disease) Dx 1995  . HTN (hypertension)     History reviewed. No pertinent surgical history.  Family History  Problem Relation Age of Onset  . CAD Mother     Allergies  Allergen Reactions  . Losartan Potassium     Rash  . Levaquin [Levofloxacin In D5w] Rash  . Ace Inhibitors Cough    REACTION: cough  . Codeine Nausea Only  . Cefepime Rash    09/04/12 pm Patient started to break out with small macules after IV Vanco infusion, then macules increased in size after starting cefepime infusion.  . Vancomycin Rash    09/04/12 pm Patient started to break out with small macules after IV Vanco infusion, then macules increased in size after starting cefepime infusion.    Outpatient Medications Prior to Visit  Medication Sig Dispense Refill  . allopurinol (ZYLOPRIM) 300 MG tablet Take 1 tablet (300 mg total) by mouth daily. 30 tablet 6  . amLODipine (NORVASC) 10 MG tablet Take 1 tablet (10 mg total) by mouth daily. 90 tablet 1  . aspirin EC 81 MG tablet Take 1 tablet (81 mg total) by mouth daily.    Marland Kitchen atorvastatin (LIPITOR) 20 MG tablet Take 1 tablet (20 mg total) by mouth daily. 90 tablet 1  . Blood Glucose Monitoring Suppl (TRUE METRIX METER) DEVI 1 kit by Does not apply route 3 (three) times daily. CHECK BLOOD SUGAR UP TO 3 TIMES DAILY. E11.9 1 each 0  . cetirizine (ZYRTEC) 10 MG tablet TAKE  1 TABLET (10 MG TOTAL) BY MOUTH DAILY. 30 tablet 1  . cloNIDine (CATAPRES) 0.1 MG tablet Take 1 tablet (0.1 mg total) by mouth at bedtime as needed. For hot flashes 90 tablet 1  . diclofenac (VOLTAREN) 75 MG EC tablet TAKE 1 TABLET (75 MG TOTAL) BY MOUTH 2 (TWO) TIMES DAILY. 60 tablet 1  . glucose blood (TRUE METRIX BLOOD GLUCOSE TEST) test strip Use as instructed 100 each 12  . levETIRAcetam (KEPPRA) 500 MG tablet Take 1 tablet (500 mg total) by mouth 2 (two) times daily. 60 tablet 11  . magnesium oxide (MAG-OX) 400 MG tablet TAKE 1 TABLET (400 MG TOTAL) BY MOUTH DAILY. 30 tablet 3  . metFORMIN (GLUCOPHAGE) 500 MG tablet Take 1 tablet (500 mg total) by mouth daily with breakfast. 90 tablet 1  . pantoprazole (PROTONIX) 20 MG tablet TAKE 1 TABLET BY MOUTH 2 TIMES DAILY BEFORE A MEAL 60 tablet 2  . TRUEplus Lancets 28G MISC CHECK BLOOD SUGAR UP TO 3 TIMES DAILY. E11.9 100 each 11   No facility-administered medications prior to visit.     ROS Review of Systems  Constitutional: Negative for activity change, appetite change and fatigue.  HENT: Negative for congestion, sinus pressure and sore throat.   Eyes: Negative for visual disturbance.  Respiratory: Negative for cough, chest tightness, shortness  of breath and wheezing.   Cardiovascular: Negative for chest pain and palpitations.  Gastrointestinal: Negative for abdominal distention, abdominal pain and constipation.  Endocrine: Negative for polydipsia.  Genitourinary: Negative for dysuria and frequency.  Musculoskeletal: Negative for arthralgias and back pain.  Skin: Negative for rash.  Neurological: Negative for tremors, light-headedness and numbness.  Hematological: Does not bruise/bleed easily.  Psychiatric/Behavioral: Negative for agitation and behavioral problems.    Objective:  BP 108/76   Pulse 89   Ht _0  (1.575 m)   Wt 161 lb (73 kg)   LMP 12/17/2014   SpO2 98%   BMI 29.45 kg/m   BP/Weight 04/24/2019 04/18/2019 10/29/7891    Systolic BP 810 175 -  Diastolic BP 76 82 -  Wt. (Lbs) 161 162 165  BMI 29.45 29.63 30.18      Physical Exam Constitutional:      General: She is not in acute distress.    Appearance: She is well-developed. She is not diaphoretic.  HENT:     Head: Normocephalic.     Right Ear: External ear normal.     Left Ear: External ear normal.     Nose: Nose normal.  Eyes:     Conjunctiva/sclera: Conjunctivae normal.     Pupils: Pupils are equal, round, and reactive to light.  Neck:     Vascular: No JVD.  Cardiovascular:     Rate and Rhythm: Normal rate and regular rhythm.     Heart sounds: Normal heart sounds. No murmur. No gallop.   Pulmonary:     Effort: Pulmonary effort is normal. No respiratory distress.     Breath sounds: Normal breath sounds. No wheezing or rales.  Chest:     Chest wall: No tenderness.     Breasts:        Right: No mass or tenderness.        Left: No mass or tenderness.  Abdominal:     General: Bowel sounds are normal. There is no distension.     Palpations: Abdomen is soft. There is no mass.     Tenderness: There is no abdominal tenderness.  Genitourinary:    Comments: External genitalia-normal Vagina, cervix, adnexa-normal Musculoskeletal:        General: No tenderness. Normal range of motion.     Cervical back: Normal range of motion.  Skin:    General: Skin is warm and dry.  Neurological:     Mental Status: She is alert and oriented to person, place, and time.     Deep Tendon Reflexes: Reflexes are normal and symmetric.  Psychiatric:        Mood and Affect: Mood normal.     CMP Latest Ref Rng & Units 10/04/2018 05/01/2018 11/10/2017  Glucose 65 - 99 mg/dL 86 158(H) 126(H)  BUN 6 - 24 mg/dL _1 Creatinine 0.57 - 1.00 mg/dL 0.95 0.93 0.93  Sodium 134 - 144 mmol/L 142 145(H) 140  Potassium 3.5 - 5.2 mmol/L 4.2 3.9 4.0  Chloride 96 - 106 mmol/L 103 104 104  CO2 20 - 29 mmol/L 21 17(L) 23  Calcium 8.7 - 10.2 mg/dL 8.3(L) 9.0 9.0  Total  Protein 6.5 - 8.1 g/dL - - 7.4  Total Bilirubin 0.3 - 1.2 mg/dL - - 0.8  Alkaline Phos 38 - 126 U/L - - 86  AST 15 - 41 U/L - - 20  ALT 0 - 44 U/L - - 16    Lipid Panel     Component  Value Date/Time   CHOL 161 06/22/2017 1041   TRIG 253 (H) 06/22/2017 1041   HDL 44 06/22/2017 1041   CHOLHDL 3.7 06/22/2017 1041   CHOLHDL 2.4 10/26/2012 1052   VLDL 46 (H) 10/26/2012 1052   LDLCALC 66 06/22/2017 1041   LDLDIRECT 153 (H) 01/30/2008 2146    CBC    Component Value Date/Time   WBC 8.7 11/10/2017 1144   RBC 4.36 11/10/2017 1144   HGB 12.8 11/10/2017 1144   HCT 39.6 11/10/2017 1144   PLT 197 11/10/2017 1144   MCV 90.8 11/10/2017 1144   MCH 29.4 11/10/2017 1144   MCHC 32.3 11/10/2017 1144   RDW 15.9 (H) 11/10/2017 1144   LYMPHSABS 1.2 11/10/2017 1144   MONOABS 0.5 11/10/2017 1144   EOSABS 0.1 11/10/2017 1144   BASOSABS 0.1 11/10/2017 1144    Lab Results  Component Value Date   HGBA1C 5.7 10/04/2018    Assessment & Plan:  1. Annual physical exam Counseled on 150 minutes of exercise per week, healthy eating (including decreased daily intake of saturated fats, cholesterol, added sugars, sodium), STI prevention, routine healthcare maintenance. - Cervicovaginal ancillary only - CBC with Differential/Platelet - Basic Metabolic Panel  2. Controlled type 2 diabetes mellitus without complication, without long-term current use of insulin (HCC) - POCT glucose (manual entry) - POCT glycosylated hemoglobin (Hb A1C)  3. Encounter for screening mammogram for malignant neoplasm of breast - MM 3D SCREEN BREAST BILATERAL; Future  4. Screening for cervical cancer - Cytology - PAP([place])    No orders of the defined types were placed in this encounter.   Follow-up: Return in about 6 weeks (around 06/05/2019) for Chronic medical condition-virtual.       Charlott Rakes, MD, FAAFP. [facility] [place] [place]   04/24/2019,  2:15 PM

## 2019-04-25 ENCOUNTER — Telehealth: Payer: Self-pay

## 2019-04-25 DIAGNOSIS — L299 Pruritus, unspecified: Secondary | ICD-10-CM

## 2019-04-25 LAB — CERVICOVAGINAL ANCILLARY ONLY
Bacterial Vaginitis (gardnerella): POSITIVE — AB
Candida Glabrata: NEGATIVE
Candida Vaginitis: NEGATIVE
Chlamydia: NEGATIVE
Comment: NEGATIVE
Comment: NEGATIVE
Comment: NEGATIVE
Comment: NEGATIVE
Comment: NEGATIVE
Comment: NORMAL
Neisseria Gonorrhea: NEGATIVE
Trichomonas: NEGATIVE

## 2019-04-25 LAB — BASIC METABOLIC PANEL
BUN/Creatinine Ratio: 11 (ref 9–23)
BUN: 10 mg/dL (ref 6–24)
CO2: 23 mmol/L (ref 20–29)
Calcium: 9.4 mg/dL (ref 8.7–10.2)
Chloride: 102 mmol/L (ref 96–106)
Creatinine, Ser: 0.92 mg/dL (ref 0.57–1.00)
GFR calc Af Amer: 82 mL/min/{1.73_m2} (ref >59)
GFR calc non Af Amer: 71 mL/min/{1.73_m2} (ref >59)
Glucose: 98 mg/dL (ref 65–99)
Potassium: 4.2 mmol/L (ref 3.5–5.2)
Sodium: 143 mmol/L (ref 134–144)

## 2019-04-25 LAB — CBC WITH DIFFERENTIAL/PLATELET
Basophils Absolute: 0 10*3/uL (ref 0.0–0.2)
Basos: 0 %
EOS (ABSOLUTE): 0.1 10*3/uL (ref 0.0–0.4)
Eos: 2 %
Hematocrit: 38.5 % (ref 34.0–46.6)
Hemoglobin: 12.9 g/dL (ref 11.1–15.9)
Immature Grans (Abs): 0 10*3/uL (ref 0.0–0.1)
Immature Granulocytes: 0 %
Lymphocytes Absolute: 2.1 10*3/uL (ref 0.7–3.1)
Lymphs: 28 %
MCH: 31.2 pg (ref 26.6–33.0)
MCHC: 33.5 g/dL (ref 31.5–35.7)
MCV: 93 fL (ref 79–97)
Monocytes Absolute: 0.7 10*3/uL (ref 0.1–0.9)
Monocytes: 9 %
Neutrophils Absolute: 4.5 10*3/uL (ref 1.4–7.0)
Neutrophils: 61 %
Platelets: 264 10*3/uL (ref 150–450)
RBC: 4.13 x10E6/uL (ref 3.77–5.28)
RDW: 14.8 % (ref 11.7–15.4)
WBC: 7.4 10*3/uL (ref 3.4–10.8)

## 2019-04-25 LAB — HEMOGLOBIN A1C
Est. average glucose Bld gHb Est-mCnc: 105 mg/dL
Hgb A1c MFr Bld: 5.3 % (ref 4.8–5.6)

## 2019-04-25 NOTE — Telephone Encounter (Signed)
Patient states that her ears itch all the time, she would like a referral to ENT.

## 2019-04-26 LAB — CYTOLOGY - PAP
Comment: NEGATIVE
Diagnosis: NEGATIVE
Diagnosis: REACTIVE
High risk HPV: NEGATIVE

## 2019-04-26 NOTE — Telephone Encounter (Signed)
Done

## 2019-04-27 ENCOUNTER — Telehealth: Payer: Self-pay

## 2019-04-27 ENCOUNTER — Other Ambulatory Visit: Payer: Self-pay | Admitting: Family Medicine

## 2019-04-27 MED ORDER — METRONIDAZOLE 0.75 % VA GEL: 1.0000 | Freq: Every day | VAGINAL | 0 refills | Status: DC

## 2019-04-27 MED FILL — metroNIDAZOLE 0.75 % GEL: 0.75 | 5 days supply | Qty: 70 | Fill #0

## 2019-04-27 NOTE — Telephone Encounter (Signed)
-----   Message from Hoy Register, MD sent at 04/27/2019  9:06 AM EST ----- Labs are negative for anemia, kidney functions and electrolytes are normal, Pap smear is negative for malignancy.  Culture reveals presence of bacterial vaginosis which is bacterial infection, not an STD.  I have sent a prescription for MetroGel to her pharmacy.

## 2019-04-27 NOTE — Telephone Encounter (Signed)
Patient was called and informed of lab results. 

## 2019-04-27 NOTE — Telephone Encounter (Signed)
Patient was informed of referral being placed and follow up appointment.

## 2019-04-30 ENCOUNTER — Telehealth: Payer: Self-pay | Admitting: Family Medicine

## 2019-04-30 MED FILL — ?METFORMIN HCL 500MG TABLET: 500 | 30 days supply | Qty: 30 | Fill #2

## 2019-04-30 MED FILL — MAGNESIUM OXIDE 400 MG TAB: 400 | 30 days supply | Qty: 30 | Fill #1

## 2019-04-30 MED FILL — TRUEplus LANCETS 28G MISC: 30 days supply | Qty: 100 | Fill #1

## 2019-04-30 MED FILL — TRUE METRIX TEST STRIP: 25 days supply | Qty: 100 | Fill #1

## 2019-04-30 MED FILL — DICLOFENAC SOD EC 75 MG TAB: 75 | 30 days supply | Qty: 60 | Fill #1

## 2019-04-30 MED FILL — ?AMLODIPINE BESYL 10MG TABL: 10 | 30 days supply | Qty: 30 | Fill #2

## 2019-04-30 MED FILL — PANTOPRAZOLE SOD DR 20 MG T: 20 | 30 days supply | Qty: 60 | Fill #2

## 2019-04-30 MED FILL — levETIRAcetam 500 MG TABS: 500 | 30 days supply | Qty: 60 | Fill #5

## 2019-04-30 NOTE — Telephone Encounter (Signed)
Patient called and requested for pcp nurse to give her a call at her earliest convenience regarding a couple of questions she has. Please follow up at your earliest convenience.

## 2019-04-30 NOTE — Telephone Encounter (Signed)
Patient was called and she has questions about recent medication that was sent over to the pharmacy. All patient's question were answered.

## 2019-05-02 ENCOUNTER — Other Ambulatory Visit: Payer: Self-pay

## 2019-05-02 ENCOUNTER — Ambulatory Visit (HOSPITAL_COMMUNITY): Payer: No Typology Code available for payment source | Attending: Cardiovascular Disease

## 2019-05-02 DIAGNOSIS — I429 Cardiomyopathy, unspecified: Secondary | ICD-10-CM | POA: Insufficient documentation

## 2019-05-02 NOTE — Telephone Encounter (Signed)
Patient was called and all questions were answered in regards to medication.

## 2019-05-02 NOTE — Telephone Encounter (Signed)
Pt called, requesting another call back. Says she has questions about her medication. Please follow up when possible

## 2019-05-04 ENCOUNTER — Other Ambulatory Visit: Payer: Self-pay

## 2019-05-04 ENCOUNTER — Ambulatory Visit: Payer: Self-pay | Attending: Family Medicine

## 2019-05-16 ENCOUNTER — Telehealth: Payer: Self-pay | Admitting: Cardiovascular Disease

## 2019-05-16 NOTE — Telephone Encounter (Signed)
Christina Byrd called stating she received a call from our office and she did not know if it was in regards to her or her husband Christina Byrd. I checked both appointment desks and charts & was unable to find documentation as to what the call was in regards to. Please advise.

## 2019-05-18 ENCOUNTER — Other Ambulatory Visit: Payer: Self-pay

## 2019-05-18 ENCOUNTER — Ambulatory Visit: Payer: Self-pay

## 2019-05-18 NOTE — Telephone Encounter (Signed)
Patient called back because she received another call. I checked her chart and it was not for her. Checked her husband, Clayton's, patient was due for an echo. I got that scheduled.

## 2019-05-22 ENCOUNTER — Other Ambulatory Visit: Payer: Self-pay | Admitting: Family Medicine

## 2019-05-22 DIAGNOSIS — K219 Gastro-esophageal reflux disease without esophagitis: Secondary | ICD-10-CM

## 2019-05-22 MED FILL — TRUE METRIX TEST STRIP: 25 days supply | Qty: 100 | Fill #2

## 2019-05-22 MED FILL — cloNIDine HCL 0.1 MG TABS: 0.1 | 30 days supply | Qty: 30 | Fill #0

## 2019-05-24 MED FILL — ?METFORMIN HCL 500MG TABLET: 500 | 90 days supply | Qty: 90 | Fill #0

## 2019-05-24 MED FILL — MAGNESIUM OXIDE 400 MG TAB: 400 | 30 days supply | Qty: 30 | Fill #2

## 2019-05-24 MED FILL — TRUEplus LANCETS 28G MISC: 30 days supply | Qty: 100 | Fill #2

## 2019-05-24 MED FILL — ?ATORVASTATIN 20 MG TABLET: 20 | 30 days supply | Qty: 30 | Fill #0

## 2019-05-24 MED FILL — levETIRAcetam 500 MG TABS: 500 | 30 days supply | Qty: 60 | Fill #6

## 2019-05-24 MED FILL — DICLOFENAC SOD EC 75 MG TAB: 75 | 30 days supply | Qty: 60 | Fill #0

## 2019-05-24 MED FILL — AMLODIPINE BESYLATE 10 MG T: 10 | 30 days supply | Qty: 30 | Fill #3

## 2019-05-24 MED FILL — PANTOPRAZOLE SOD DR 20 MG T: 20 | 30 days supply | Qty: 60 | Fill #0

## 2019-06-05 ENCOUNTER — Ambulatory Visit: Payer: Self-pay | Attending: Family Medicine | Admitting: Pharmacist

## 2019-06-05 ENCOUNTER — Other Ambulatory Visit: Payer: Self-pay

## 2019-06-05 DIAGNOSIS — Z7189 Other specified counseling: Secondary | ICD-10-CM

## 2019-06-05 NOTE — Progress Notes (Signed)
Patient was educated on the use of the True Metrix blood glucose meter. Reviewed necessary supplies and operation of the meter. Also reviewed goal blood glucose levels. Patient was able to demonstrate use. All questions and concerns were addressed.   

## 2019-06-07 ENCOUNTER — Other Ambulatory Visit: Payer: Self-pay

## 2019-06-07 ENCOUNTER — Ambulatory Visit: Payer: Self-pay | Attending: Family Medicine | Admitting: Family Medicine

## 2019-06-07 ENCOUNTER — Other Ambulatory Visit: Payer: Self-pay | Admitting: Family Medicine

## 2019-06-07 ENCOUNTER — Encounter: Payer: Self-pay | Admitting: Family Medicine

## 2019-06-07 DIAGNOSIS — B3731 Acute candidiasis of vulva and vagina: Secondary | ICD-10-CM

## 2019-06-07 DIAGNOSIS — B373 Candidiasis of vulva and vagina: Secondary | ICD-10-CM

## 2019-06-07 DIAGNOSIS — L299 Pruritus, unspecified: Secondary | ICD-10-CM

## 2019-06-07 DIAGNOSIS — E119 Type 2 diabetes mellitus without complications: Secondary | ICD-10-CM

## 2019-06-07 DIAGNOSIS — R569 Unspecified convulsions: Secondary | ICD-10-CM

## 2019-06-07 DIAGNOSIS — R253 Fasciculation: Secondary | ICD-10-CM

## 2019-06-07 DIAGNOSIS — I10 Essential (primary) hypertension: Secondary | ICD-10-CM

## 2019-06-07 DIAGNOSIS — Z972 Presence of dental prosthetic device (complete) (partial): Secondary | ICD-10-CM

## 2019-06-07 MED ORDER — METFORMIN HCL 500 MG PO TABS: 250.0000 mg | ORAL_TABLET | Freq: Every day | ORAL | 1 refills | Status: DC

## 2019-06-07 MED ORDER — AMLODIPINE BESYLATE 10 MG PO TABS: 10.0000 mg | ORAL_TABLET | Freq: Every day | ORAL | 1 refills | Status: DC

## 2019-06-07 MED ORDER — DEBROX 6.5 % OT SOLN: 5.0000 [drp] | Freq: Two times a day (BID) | OTIC | 1 refills | Status: DC

## 2019-06-07 MED ORDER — ALLOPURINOL 300 MG PO TABS: 300.0000 mg | ORAL_TABLET | Freq: Every day | ORAL | 6 refills | Status: DC

## 2019-06-07 MED ORDER — ATORVASTATIN CALCIUM 20 MG PO TABS: 20.0000 mg | ORAL_TABLET | Freq: Every day | ORAL | 1 refills | Status: DC

## 2019-06-07 MED ORDER — FLUCONAZOLE 150 MG PO TABS: 150.0000 mg | ORAL_TABLET | Freq: Once | ORAL | 0 refills | Status: AC

## 2019-06-07 MED FILL — FLUCONAZOLE 150 MG TABLET: 150 | 1 days supply | Qty: 1 | Fill #0

## 2019-06-07 MED FILL — ?ALLOPURINOL 300 MG TABLET: 300 | 30 days supply | Qty: 30 | Fill #0

## 2019-06-07 NOTE — Progress Notes (Signed)
Virtual Visit via Telephone Note  I connected with Christina Byrd, on 06/07/2019 at 10:13 AM by telephone due to the COVID-19 pandemic and verified that I am speaking with the correct person using two identifiers.   Consent: I discussed the limitations, risks, security and privacy concerns of performing an evaluation and management service by telephone and the availability of in person appointments. I also discussed with the patient that there may be a patient responsible charge related to this service. The patient expressed understanding and agreed to proceed.   Location of Patient: Home  Location of Provider: Clinic   Persons participating in Telemedicine visit: Simone Rodenbeck Farrington-CMA Dr. 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 She has also had vaginal itching after using metrogel   GERD has been controlled and she uses her PPI only when she eats a spicy or greasy foods. Gout flares have ceased since she stopped eating red meat and slowed down drinking liquor. Compliant with Metformin for Diabetes and is compliant with her antihypertensive. Denies presence of hypoglycemia, neuropathy or visual concerns.  She complains her ears are itching with a sore in a one ear. I referred her to ENT however notes in Epic reveal this is not covered under [facility] She is requesting a Dental referral for evaluation of her dentures.  Past Medical History:  Diagnosis Date  . GASTROESOPHAGEAL REFLUX, NO ESOPHAGITIS 04/21/2006   Qualifier: Diagnosis of  By: Benna Dunks    . GERD (gastroesophageal reflux disease) Dx 1995  . HTN (hypertension)    Allergies  Allergen Reactions  . Losartan Potassium      Rash  . Levaquin [Levofloxacin In D5w] Rash  . Ace Inhibitors Cough    REACTION: cough  . Codeine Nausea Only  . Cefepime Rash    09/04/12 pm Patient started to break out with small macules after IV Vanco infusion, then macules increased in size after starting cefepime infusion.  . Vancomycin Rash    09/04/12 pm Patient started to break out with small macules after IV Vanco infusion, then macules increased in size after starting cefepime infusion.    Current Outpatient Medications on File Prior to Visit  Medication Sig Dispense Refill  . allopurinol (ZYLOPRIM) 300 MG tablet Take 1 tablet (300 mg total) by mouth daily. 30 tablet 6  . amLODipine (NORVASC) 10 MG tablet Take 1 tablet (10 mg total) by mouth daily. 90 tablet 1  . aspirin EC 81 MG tablet Take 1 tablet (81 mg total) by mouth daily.    Marland Kitchen atorvastatin (LIPITOR) 20 MG tablet Take 1 tablet (20 mg total) by mouth daily. 90 tablet 1  . Blood Glucose Monitoring Suppl (TRUE METRIX METER) DEVI 1 kit by Does not apply route 3 (three) times daily. CHECK BLOOD SUGAR UP TO 3 TIMES DAILY. E11.9 1 each 0  . cetirizine (ZYRTEC) 10 MG tablet TAKE 1 TABLET (10 MG TOTAL) BY MOUTH DAILY. 30 tablet 1  . cloNIDine (CATAPRES) 0.1 MG tablet Take 1 tablet (0.1 mg total) by mouth at bedtime as needed. For hot flashes 90 tablet 1  . diclofenac (VOLTAREN) 75 MG EC tablet TAKE 1 TABLET (75 MG TOTAL) BY  MOUTH 2 (TWO) TIMES DAILY. 60 tablet 1  . glucose blood (TRUE METRIX BLOOD GLUCOSE TEST) test strip Use as instructed 100 each 12  . levETIRAcetam (KEPPRA) 500 MG tablet Take 1 tablet (500 mg total) by mouth 2 (two) times daily. 60 tablet 11  . magnesium oxide (MAG-OX) 400 MG tablet TAKE 1 TABLET (400 MG TOTAL) BY MOUTH DAILY. 30 tablet 3  . metFORMIN (GLUCOPHAGE) 500 MG tablet Take 1 tablet (500 mg total) by mouth daily with breakfast. 90 tablet 1  . pantoprazole (PROTONIX) 20 MG tablet TAKE 1 TABLET BY MOUTH 2 TIMES DAILY BEFORE A MEAL 60 tablet 2  .  TRUEplus Lancets 28G MISC CHECK BLOOD SUGAR UP TO 3 TIMES DAILY. E11.9 100 each 11  . metroNIDAZOLE (METROGEL VAGINAL) 0.75 % vaginal gel Place 1 Applicatorful vaginally at bedtime. (Patient not taking: Reported on 06/07/2019) 70 g 0   No current facility-administered medications on file prior to visit.    Observations/Objective: Awake, alert, oriented x3 Not in acute distress  Lab Results  Component Value Date   HGBA1C 5.3 04/24/2019     Assessment and Plan: 1. Fasciculations of muscle Patient reassured  2. HYPERTENSION, BENIGN ESSENTIAL Controlled Counseled on blood pressure goal of less than 130/80, low-sodium, DASH diet, medication compliance, 150 minutes of moderate intensity exercise per week. Discussed medication compliance, adverse effects. - amLODipine (NORVASC) 10 MG tablet; Take 1 tablet (10 mg total) by mouth daily.  Dispense: 90 tablet; Refill: 1  3. Controlled type 2 diabetes mellitus without complication, without long-term current use of insulin (HCC) Controlled with A1c of 5.3 Reduce Metformin from 500 mg to 250 mg daily - atorvastatin (LIPITOR) 20 MG tablet; Take 1 tablet (20 mg total) by mouth daily.  Dispense: 90 tablet; Refill: 1 - metFORMIN (GLUCOPHAGE) 500 MG tablet; Take 0.5 tablets (250 mg total) by mouth daily with breakfast.  Dispense: 45 tablet; Refill: 1  4. Ear itching Could be secondary to cerumen impaction Unable to see ENT due to lack of medical coverage. - carbamide peroxide (DEBROX) 6.5 % OTIC solution; Place 5 drops into both ears 2 (two) times daily.  Dispense: 15 mL; Refill: 1  5. Wears dentures - Ambulatory referral to Dentistry  6. Seizure ([place]) Stable Continue Keppra Follow up with Neurology  7. Vaginal candidiasis - fluconazole (DIFLUCAN) 150 MG tablet; Take 1 tablet (150 mg total) by mouth once for 1 dose.  Dispense: 1 tablet; Refill: 0   Follow Up Instructions: 6 months   I discussed the assessment and treatment plan with  the patient. The patient was provided an opportunity to ask questions and all were answered. The patient agreed with the plan and demonstrated an understanding of the instructions.   The patient was advised to call back or seek an in-person evaluation if the symptoms worsen or if the condition fails to improve as anticipated.     I provided 17 minutes total of non-face-to-face time during this encounter including median intraservice time, reviewing previous notes, investigations, ordering medications, medical decision making, coordinating care and patient verbalized understanding at the end of the visit.     Charlott Rakes, MD, FAAFP. [facility] and [address], [place]   06/07/2019, 10:13 AM

## 2019-06-07 NOTE — Progress Notes (Signed)
Metrogel has caused vaginal itching. States that ears are still itching and one has a sore.

## 2019-06-25 ENCOUNTER — Telehealth: Payer: Self-pay | Admitting: Family Medicine

## 2019-06-25 MED FILL — TRUEplus LANCETS 28G MISC: 30 days supply | Qty: 100 | Fill #3

## 2019-06-25 MED FILL — ?cloNIDine HCL 0.1 MG TABS: 0.1 | 30 days supply | Qty: 30 | Fill #1

## 2019-06-25 MED FILL — ?ATORVASTATIN 20 MG TABLET: 20 | 30 days supply | Qty: 30 | Fill #1

## 2019-06-25 MED FILL — PANTOPRAZOLE SOD DR 20 MG T: 20 | 30 days supply | Qty: 60 | Fill #1

## 2019-06-25 MED FILL — DICLOFENAC SOD EC 75 MG TAB: 75 | 30 days supply | Qty: 60 | Fill #1

## 2019-06-25 MED FILL — levETIRAcetam 500 MG TABS: 500 | 30 days supply | Qty: 60 | Fill #7

## 2019-06-25 MED FILL — AMLODIPINE BESYLATE 10 MG T: 10 | 30 days supply | Qty: 30 | Fill #0

## 2019-06-25 MED FILL — MAGNESIUM OXIDE 400 MG TAB: 400 | 30 days supply | Qty: 30 | Fill #3

## 2019-06-25 MED FILL — TRUE METRIX TEST STRIP: 25 days supply | Qty: 100 | Fill #3

## 2019-06-25 NOTE — Telephone Encounter (Signed)
Dr. Myrtis Hopping office called saying that they no longer accept the orange card. Please f/u

## 2019-06-27 NOTE — Telephone Encounter (Signed)
Pt do not have insurance And [facility] or [facility] don't have ENT in the network Mailed a letter to patient with the option to go to [facility] or [facility] $ [facility] and after consultation  apply for financial with them Waiting for patient response. 

## 2019-07-27 ENCOUNTER — Other Ambulatory Visit: Payer: Self-pay | Admitting: Family Medicine

## 2019-07-27 MED FILL — DICLOFENAC SOD EC 75 MG TAB: 75 | 30 days supply | Qty: 60 | Fill #0

## 2019-07-27 MED FILL — levETIRAcetam 500 MG TABS: 500 | 30 days supply | Qty: 60 | Fill #1

## 2019-07-27 MED FILL — AMLODIPINE BESYLATE 10 MG T: 10 | 30 days supply | Qty: 30 | Fill #1

## 2019-07-27 MED FILL — MAGNESIUM OXIDE 400 MG TAB: 400 | 30 days supply | Qty: 30 | Fill #0

## 2019-07-31 ENCOUNTER — Other Ambulatory Visit: Payer: Self-pay | Admitting: Family Medicine

## 2019-07-31 DIAGNOSIS — R232 Flushing: Secondary | ICD-10-CM

## 2019-07-31 MED FILL — ?ATORVASTATIN 20 MG TABLET: 20 | 30 days supply | Qty: 30 | Fill #0

## 2019-08-03 MED FILL — ?cloNIDine HCL 0.1 MG TABS: 0.1 | 30 days supply | Qty: 30 | Fill #0

## 2019-08-20 MED FILL — DICLOFENAC SOD EC 75 MG TAB: 75 | 30 days supply | Qty: 60 | Fill #1

## 2019-08-20 MED FILL — AMLODIPINE BESYLATE 10 MG T: 10 | 30 days supply | Qty: 30 | Fill #2

## 2019-08-20 MED FILL — levETIRAcetam 500 MG TABS: 500 | 30 days supply | Qty: 60 | Fill #2

## 2019-08-20 MED FILL — MAGNESIUM OXIDE 400 MG TAB: 400 | 30 days supply | Qty: 30 | Fill #1

## 2019-08-24 MED FILL — ?ATORVASTATIN 20 MG TABLET: 20 | 30 days supply | Qty: 30 | Fill #1

## 2019-08-29 ENCOUNTER — Other Ambulatory Visit: Payer: Self-pay | Admitting: Family Medicine

## 2019-08-29 ENCOUNTER — Ambulatory Visit: Payer: Self-pay | Attending: Family Medicine | Admitting: Family Medicine

## 2019-08-29 ENCOUNTER — Other Ambulatory Visit: Payer: Self-pay

## 2019-08-29 ENCOUNTER — Encounter: Payer: Self-pay | Admitting: Family Medicine

## 2019-08-29 VITALS — BP 121/81 | HR 91 | Ht 62.0 in | Wt 155.0 lb

## 2019-08-29 DIAGNOSIS — M19041 Primary osteoarthritis, right hand: Secondary | ICD-10-CM

## 2019-08-29 DIAGNOSIS — J3489 Other specified disorders of nose and nasal sinuses: Secondary | ICD-10-CM

## 2019-08-29 DIAGNOSIS — E119 Type 2 diabetes mellitus without complications: Secondary | ICD-10-CM

## 2019-08-29 DIAGNOSIS — M19042 Primary osteoarthritis, left hand: Secondary | ICD-10-CM

## 2019-08-29 DIAGNOSIS — R253 Fasciculation: Secondary | ICD-10-CM

## 2019-08-29 DIAGNOSIS — I10 Essential (primary) hypertension: Secondary | ICD-10-CM

## 2019-08-29 MED ORDER — TIZANIDINE HCL 4 MG PO TABS: 4.0000 mg | ORAL_TABLET | Freq: Three times a day (TID) | ORAL | 1 refills | Status: DC | PRN

## 2019-08-29 MED ORDER — AMLODIPINE BESYLATE 10 MG PO TABS: 10.0000 mg | ORAL_TABLET | Freq: Every day | ORAL | 1 refills | Status: DC

## 2019-08-29 MED ORDER — CETIRIZINE HCL 10 MG PO TABS: 10.0000 mg | ORAL_TABLET | Freq: Every day | ORAL | 1 refills | Status: DC

## 2019-08-29 MED ORDER — METFORMIN HCL 500 MG PO TABS: 250.0000 mg | ORAL_TABLET | Freq: Every day | ORAL | 6 refills | Status: DC

## 2019-08-29 MED ORDER — ATORVASTATIN CALCIUM 20 MG PO TABS: 20.0000 mg | ORAL_TABLET | Freq: Every day | ORAL | 1 refills | Status: DC

## 2019-08-29 MED ORDER — ALLOPURINOL 300 MG PO TABS: 300.0000 mg | ORAL_TABLET | Freq: Every day | ORAL | 6 refills | Status: DC

## 2019-08-29 MED FILL — ?CETIRIZINE HCL 10 MG TABLE: 10 | 30 days supply | Qty: 30 | Fill #0

## 2019-08-29 MED FILL — METFORMIN HCL 500 MG TABS: 500 | 30 days supply | Qty: 15 | Fill #0

## 2019-08-29 MED FILL — tiZANidine HCL 4 MG TABS: 4 | 20 days supply | Qty: 60 | Fill #0

## 2019-08-29 MED FILL — ALLOPURINOL 300 MG TAB: 300 | 30 days supply | Qty: 30 | Fill #0

## 2019-08-29 NOTE — Progress Notes (Signed)
Pain in left breast states that its a jumping feeling and the pain shot up to neck.  States that she notice weakness in her hands.  Still having phlegm.

## 2019-08-29 NOTE — Patient Instructions (Signed)
Arthritis Arthritis means joint pain. It can also mean joint disease. A joint is a place where bones come together. There are more than 100 types of arthritis. What are the causes? This condition may be caused by:  Wear and tear of a joint. This is the most common cause.  A lot of acid in the blood, which leads to pain in the joint (gout).  Pain and swelling (inflammation) in a joint.  Infection of a joint.  Injuries in the joint.  A reaction to medicines (allergy). In some cases, the cause may not be known. What are the signs or symptoms? Symptoms of this condition include:  Redness at a joint.  Swelling at a joint.  Stiffness at a joint.  Warmth coming from the joint.  A fever.  A feeling of being sick. How is this treated? This condition may be treated with:  Treating the cause, if it is known.  Rest.  Raising (elevating) the joint.  Putting cold or hot packs on the joint.  Medicines to treat symptoms and reduce pain and swelling.  Shots of medicines (cortisone) into the joint. You may also be told to make changes in your life, such as doing exercises and losing weight. Follow these instructions at home: Medicines  Take over-the-counter and prescription medicines only as told by your doctor.  Do not take aspirin for pain if your doctor says that you may have gout. Activity  Rest your joint if your doctor tells you to.  Avoid activities that make the pain worse.  Exercise your joint regularly as told by your doctor. Try doing exercises like: ? Swimming. ? Water aerobics. ? Biking. ? Walking. Managing pain, stiffness, and swelling      If told, put ice on the affected area. ? Put ice in a plastic bag. ? Place a towel between your skin and the bag. ? Leave the ice on for 20 minutes, 2-3 times per day.  If your joint is swollen, raise (elevate) it above the level of your heart if told by your doctor.  If your joint feels stiff in the morning,  try taking a warm shower.  If told, put heat on the affected area. Do this as often as told by your doctor. Use the heat source that your doctor recommends, such as a moist heat pack or a heating pad. If you have diabetes, do not apply heat without asking your doctor. To apply heat: ? Place a towel between your skin and the heat source. ? Leave the heat on for 20-30 minutes. ? Remove the heat if your skin turns bright red. This is very important if you are unable to feel pain, heat, or cold. You may have a greater risk of getting burned. General instructions  Do not use any products that contain nicotine or tobacco, such as cigarettes, e-cigarettes, and chewing tobacco. If you need help quitting, ask your doctor.  Keep all follow-up visits as told by your doctor. This is important. Contact a doctor if:  The pain gets worse.  You have a fever. Get help right away if:  You have very bad pain in your joint.  You have swelling in your joint.  Your joint is red.  Many joints become painful and swollen.  You have very bad back pain.  Your leg is very weak.  You cannot control your pee (urine) or poop (stool). Summary  Arthritis means joint pain. It can also mean joint disease. A joint is a place   where bones come together.  The most common cause of this condition is wear and tear of a joint.  Symptoms of this condition include redness, swelling, or stiffness of the joint.  This condition is treated with rest, raising the joint, medicines, and putting cold or hot packs on the joint.  Follow your doctor's instructions about medicines, activity, exercises, and other home care treatments. This information is not intended to replace advice given to you by your health care provider. Make sure you discuss any questions you have with your health care provider. Document Revised: 01/16/2018 Document Reviewed: 01/16/2018 Elsevier Patient Education  2020 Elsevier Inc.  

## 2019-08-29 NOTE — Progress Notes (Signed)
Subjective:  Patient ID: Christina Byrd, female    DOB: [birth year]  Age: 54 y.o. MRN: [id number]  CC: Breast Pain   HPI Christina Byrd is a 54 year old female with a history of type 2 diabetes mellitus (A1c 5.3), GERD, Seizures here for follow-up visit.  Over the last 1 week she has noticed weakness in her hands which occurs intermittently when holding her phone to the point that she feels she might drop her phone but has never dropped it.  Denies presence of numbness in her hands.  Also feels fasiculations in her left upper chestwall when she attempts to lift heavy equipment like a washing machine.  Pain radiates up to the left side of her neck.  She denies presence of breast pain or breast lumps.  I had referred her for mammogram 3 months ago which she never followed through with. Also has phlegm in her throat during the day.  With regards to her diabetes mellitus she is doing well on Metformin.  Her seizures are stable with no recent seizures and she follows up with neurology next month. Past Medical History:  Diagnosis Date  . GASTROESOPHAGEAL REFLUX, NO ESOPHAGITIS 04/21/2006   Qualifier: Diagnosis of  By: Benna Dunks    . GERD (gastroesophageal reflux disease) Dx 1995  . HTN (hypertension)     No past surgical history on file.  Family History  Problem Relation Age of Onset  . CAD Mother     Allergies  Allergen Reactions  . Losartan Potassium     Rash  . Levaquin [Levofloxacin In D5w] Rash  . Ace Inhibitors Cough    REACTION: cough  . Codeine Nausea Only  . Cefepime Rash    09/04/12 pm Patient started to break out with small macules after IV Vanco infusion, then macules increased in size after starting cefepime infusion.  . Vancomycin Rash    09/04/12 pm Patient started to break out with small macules after IV Vanco infusion, then macules increased in size after starting cefepime infusion.    Outpatient Medications Prior to Visit  Medication Sig Dispense  Refill  . aspirin EC 81 MG tablet Take 1 tablet (81 mg total) by mouth daily.    . Blood Glucose Monitoring Suppl (TRUE METRIX METER) DEVI 1 kit by Does not apply route 3 (three) times daily. CHECK BLOOD SUGAR UP TO 3 TIMES DAILY. E11.9 1 each 0  . carbamide peroxide (DEBROX) 6.5 % OTIC solution Place 5 drops into both ears 2 (two) times daily. 15 mL 1  . cloNIDine (CATAPRES) 0.1 MG tablet TAKE 1 TABLET (0.1 MG TOTAL) BY MOUTH AT BEDTIME AS NEEDED. FOR HOT FLASHES 30 tablet 1  . diclofenac (VOLTAREN) 75 MG EC tablet TAKE 1 TABLET (75 MG TOTAL) BY MOUTH 2 (TWO) TIMES DAILY. 60 tablet 1  . glucose blood (TRUE METRIX BLOOD GLUCOSE TEST) test strip Use as instructed 100 each 12  . levETIRAcetam (KEPPRA) 500 MG tablet Take 1 tablet (500 mg total) by mouth 2 (two) times daily. 60 tablet 11  . magnesium oxide (MAG-OX) 400 MG tablet TAKE 1 TABLET (400 MG TOTAL) BY MOUTH DAILY. 30 tablet 3  . pantoprazole (PROTONIX) 20 MG tablet TAKE 1 TABLET BY MOUTH 2 TIMES DAILY BEFORE A MEAL 60 tablet 2  . TRUEplus Lancets 28G MISC CHECK BLOOD SUGAR UP TO 3 TIMES DAILY. E11.9 100 each 11  . allopurinol (ZYLOPRIM) 300 MG tablet Take 1 tablet (300 mg total) by mouth daily. 30 tablet 6  .  amLODipine (NORVASC) 10 MG tablet Take 1 tablet (10 mg total) by mouth daily. 90 tablet 1  . atorvastatin (LIPITOR) 20 MG tablet Take 1 tablet (20 mg total) by mouth daily. 90 tablet 1  . cetirizine (ZYRTEC) 10 MG tablet TAKE 1 TABLET (10 MG TOTAL) BY MOUTH DAILY. 30 tablet 1  . metFORMIN (GLUCOPHAGE) 500 MG tablet Take 0.5 tablets (250 mg total) by mouth daily with breakfast. 45 tablet 1  . metroNIDAZOLE (METROGEL VAGINAL) 0.75 % vaginal gel Place 1 Applicatorful vaginally at bedtime. (Patient not taking: Reported on 06/07/2019) 70 g 0   No facility-administered medications prior to visit.     ROS Review of Systems  Constitutional: Negative for activity change, appetite change and fatigue.  HENT: Negative for congestion, sinus  pressure and sore throat.   Eyes: Negative for visual disturbance.  Respiratory: Negative for cough, chest tightness, shortness of breath and wheezing.   Cardiovascular: Negative for chest pain and palpitations.  Gastrointestinal: Negative for abdominal distention, abdominal pain and constipation.  Endocrine: Negative for polydipsia.  Genitourinary: Negative for dysuria and frequency.  Musculoskeletal:       See HPI  Skin: Negative for rash.  Neurological: Negative for tremors, light-headedness and numbness.  Hematological: Does not bruise/bleed easily.  Psychiatric/Behavioral: Negative for agitation and behavioral problems.    Objective:  BP 121/81   Pulse 91   Ht $R'5\' 2"'RX$  (1.575 m)   Wt 155 lb (70.3 kg)   LMP 12/17/2014   SpO2 100%   BMI 28.35 kg/m   BP/Weight 08/29/2019 04/24/2019 3/76/2831  Systolic BP 517 616 073  Diastolic BP 81 76 82  Wt. (Lbs) 155 161 162  BMI 28.35 29.45 29.63      Physical Exam Constitutional:      Appearance: She is well-developed.  Neck:     Vascular: No JVD.  Cardiovascular:     Rate and Rhythm: Normal rate.     Heart sounds: Normal heart sounds. No murmur heard.   Pulmonary:     Effort: Pulmonary effort is normal.     Breath sounds: Normal breath sounds. No wheezing or rales.  Chest:     Chest wall: No tenderness.  Abdominal:     General: Bowel sounds are normal. There is no distension.     Palpations: Abdomen is soft. There is no mass.     Tenderness: There is no abdominal tenderness.  Musculoskeletal:        General: No tenderness. Normal range of motion.     Right lower leg: No edema.     Left lower leg: No edema.     Comments: Normal appearance of both hands, able to make a fist Handgrip is 5/5 bilaterally  Neurological:     Mental Status: She is alert and oriented to person, place, and time.  Psychiatric:        Mood and Affect: Mood normal.     CMP Latest Ref Rng & Units 04/24/2019 10/04/2018 05/01/2018  Glucose 65 - 99 mg/dL  98 86 158(H)  BUN 6 - 24 mg/dL $Remove'10 13 13  'TZssugJ$ Creatinine 0.57 - 1.00 mg/dL 0.92 0.95 0.93  Sodium 134 - 144 mmol/L 143 142 145(H)  Potassium 3.5 - 5.2 mmol/L 4.2 4.2 3.9  Chloride 96 - 106 mmol/L 102 103 104  CO2 20 - 29 mmol/L 23 21 17(L)  Calcium 8.7 - 10.2 mg/dL 9.4 8.3(L) 9.0  Total Protein 6.5 - 8.1 g/dL - - -  Total Bilirubin 0.3 - 1.2 mg/dL - - -  Alkaline Phos 38 - 126 U/L - - -  AST 15 - 41 U/L - - -  ALT 0 - 44 U/L - - -    Lipid Panel     Component Value Date/Time   CHOL 161 06/22/2017 1041   TRIG 253 (H) 06/22/2017 1041   HDL 44 06/22/2017 1041   CHOLHDL 3.7 06/22/2017 1041   CHOLHDL 2.4 10/26/2012 1052   VLDL 46 (H) 10/26/2012 1052   LDLCALC 66 06/22/2017 1041   LDLDIRECT 153 (H) 01/30/2008 2146    CBC    Component Value Date/Time   WBC 7.4 04/24/2019 1459   WBC 8.7 11/10/2017 1144   RBC 4.13 04/24/2019 1459   RBC 4.36 11/10/2017 1144   HGB 12.9 04/24/2019 1459   HCT 38.5 04/24/2019 1459   PLT 264 04/24/2019 1459   MCV 93 04/24/2019 1459   MCH 31.2 04/24/2019 1459   MCH 29.4 11/10/2017 1144   MCHC 33.5 04/24/2019 1459   MCHC 32.3 11/10/2017 1144   RDW 14.8 04/24/2019 1459   LYMPHSABS 2.1 04/24/2019 1459   MONOABS 0.5 11/10/2017 1144   EOSABS 0.1 04/24/2019 1459   BASOSABS 0.0 04/24/2019 1459    Lab Results  Component Value Date   HGBA1C 5.3 04/24/2019    Assessment & Plan:  1. Controlled type 2 diabetes mellitus without complication, without long-term current use of insulin (HCC) Controlled with A1c of 5.3 Counseled on Diabetic diet, my plate method, 403 minutes of moderate intensity exercise/week Blood sugar logs with fasting goals of 80-120 mg/dl, random of less than 180 and in the event of sugars less than 60 mg/dl or greater than 400 mg/dl encouraged to notify the clinic. Advised on the need for annual eye exams, annual foot exams, Pneumonia vaccine. - metFORMIN (GLUCOPHAGE) 500 MG tablet; Take 0.5 tablets (250 mg total) by mouth daily with  breakfast.  Dispense: 45 tablet; Refill: 6 - Microalbumin / creatinine urine ratio - atorvastatin (LIPITOR) 20 MG tablet; Take 1 tablet (20 mg total) by mouth daily.  Dispense: 90 tablet; Refill: 1  2. Sinus drainage - cetirizine (ZYRTEC) 10 MG tablet; Take 1 tablet (10 mg total) by mouth daily.  Dispense: 30 tablet; Refill: 1  3. HYPERTENSION, BENIGN ESSENTIAL Controlled Counseled on blood pressure goal of less than 130/80, low-sodium, DASH diet, medication compliance, 150 minutes of moderate intensity exercise per week. Discussed medication compliance, adverse effects. - amLODipine (NORVASC) 10 MG tablet; Take 1 tablet (10 mg total) by mouth daily.  Dispense: 90 tablet; Refill: 1  4. Fasciculations of muscle - tiZANidine (ZANAFLEX) 4 MG tablet; Take 1 tablet (4 mg total) by mouth every 8 (eight) hours as needed for muscle spasms.  Dispense: 60 tablet; Refill: 1  5. Primary osteoarthritis of both hands Advised to use OTC NSAIDs as needed    Meds ordered this encounter  Medications  . cetirizine (ZYRTEC) 10 MG tablet    Sig: Take 1 tablet (10 mg total) by mouth daily.    Dispense:  30 tablet    Refill:  1  . metFORMIN (GLUCOPHAGE) 500 MG tablet    Sig: Take 0.5 tablets (250 mg total) by mouth daily with breakfast.    Dispense:  45 tablet    Refill:  6  . tiZANidine (ZANAFLEX) 4 MG tablet    Sig: Take 1 tablet (4 mg total) by mouth every 8 (eight) hours as needed for muscle spasms.    Dispense:  60 tablet    Refill:  1  .  allopurinol (ZYLOPRIM) 300 MG tablet    Sig: Take 1 tablet (300 mg total) by mouth daily.    Dispense:  30 tablet    Refill:  6  . amLODipine (NORVASC) 10 MG tablet    Sig: Take 1 tablet (10 mg total) by mouth daily.    Dispense:  90 tablet    Refill:  1  . atorvastatin (LIPITOR) 20 MG tablet    Sig: Take 1 tablet (20 mg total) by mouth daily.    Dispense:  90 tablet    Refill:  1    Follow-up: Return in about 6 months (around 02/29/2020) for Chronic  disease management.       Charlott Rakes, MD, FAAFP. [facility] and [facility], [place]   08/29/2019, 5:57 PM

## 2019-08-30 LAB — MICROALBUMIN / CREATININE URINE RATIO
Creatinine, Urine: 274.8 mg/dL
Microalb/Creat Ratio: 13 mg/g creat (ref 0–29)
Microalbumin, Urine: 36.1 ug/mL

## 2019-09-03 MED FILL — TRUE METRIX TEST STRIP: 25 days supply | Qty: 100 | Fill #4

## 2019-09-03 MED FILL — TRUEplus LANCETS 28G MISC: 30 days supply | Qty: 100 | Fill #4

## 2019-09-04 ENCOUNTER — Telehealth: Payer: Self-pay | Admitting: Family Medicine

## 2019-09-04 NOTE — Telephone Encounter (Signed)
Patient was called and given phone numbers for her mammogram and cardiology

## 2019-09-04 NOTE — Telephone Encounter (Signed)
Please f/u  Copied from CRM #[id number]. Topic: General - Other >> Sep 04, 2019  2:52 PM Christina Byrd A wrote: Reason for CRM: Patient called to ask Dr Earley Abide nurse if she can please schedule her an appointment with her heart Dr and also need the number that Dr Alvis Lemmings gave her for her to schedule a mammogram. Patient would like a call back at Ph# [phone]

## 2019-09-25 ENCOUNTER — Other Ambulatory Visit: Payer: Self-pay | Admitting: Family Medicine

## 2019-09-25 DIAGNOSIS — K219 Gastro-esophageal reflux disease without esophagitis: Secondary | ICD-10-CM

## 2019-09-25 MED FILL — PANTOPRAZOLE SOD DR 20 MG T: 20 | 30 days supply | Qty: 60 | Fill #0

## 2019-09-25 MED FILL — AMLODIPINE BESYLATE 10 MG T: 10 | 30 days supply | Qty: 30 | Fill #3

## 2019-09-25 MED FILL — ?ALLOPURINOL 300 MG TABLET: 300 | 30 days supply | Qty: 30 | Fill #1

## 2019-09-25 MED FILL — METFORMIN HCL 500 MG TABS: 500 | 30 days supply | Qty: 15 | Fill #1

## 2019-09-25 MED FILL — MAGNESIUM OXIDE 400 MG TAB: 400 | 30 days supply | Qty: 30 | Fill #2

## 2019-09-25 MED FILL — ?CETIRIZINE HCL 10 MG TABLE: 10 | 30 days supply | Qty: 30 | Fill #1

## 2019-09-25 MED FILL — levETIRAcetam 500 MG TABS: 500 | 30 days supply | Qty: 60 | Fill #3

## 2019-09-25 MED FILL — ?ATORVASTATIN 20 MG TABLET: 20 | 30 days supply | Qty: 30 | Fill #2

## 2019-09-28 ENCOUNTER — Other Ambulatory Visit: Payer: Self-pay

## 2019-09-28 ENCOUNTER — Ambulatory Visit (INDEPENDENT_AMBULATORY_CARE_PROVIDER_SITE_OTHER): Payer: Self-pay | Admitting: Neurology

## 2019-09-28 ENCOUNTER — Encounter: Payer: Self-pay | Admitting: Neurology

## 2019-09-28 VITALS — BP 109/74 | HR 94 | Ht 62.0 in | Wt 155.8 lb

## 2019-09-28 DIAGNOSIS — G40209 Localization-related (focal) (partial) symptomatic epilepsy and epileptic syndromes with complex partial seizures, not intractable, without status epilepticus: Secondary | ICD-10-CM

## 2019-09-28 MED ORDER — LEVETIRACETAM 500 MG PO TABS: 500.0000 mg | ORAL_TABLET | Freq: Two times a day (BID) | ORAL | 11 refills | Status: DC

## 2019-09-28 NOTE — Progress Notes (Signed)
NEUROLOGY FOLLOW UP OFFICE NOTE  Christina Byrd [id number] 1965-09-29  HISTORY OF PRESENT ILLNESS: I had the pleasure of seeing Christina Byrd in follow-up in the neurology clinic on 09/28/2019. She is accompanied by her significant other who helps supplement the history today. 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 She continues to deny any seizures since May 2020. She is on Levetiracetam '500mg'$  BID without side effects. They deny any episodes of staring/unresponsiveness, gaps in time, focal numbness/tingling/weakness (except when sitting for a long time, leaning on her elbows, she has left arm and leg tingling that resolve when she shakes them). She has rare olfactory hallucinations where she smells something funky. She denies any headaches, dizziness, diplopia, no falls. There are some days she is out of balance and cannot get it together. She notes that these may be related to sleep deprivation. She still drinks alcohol, drinking 2 beers and 4-6 shots every night. She is getting ready to cut back on alcohol. Mood is good. She does not drive but is interested in resuming driving, awaiting DMV determination.   History on Initial Assessment 11/18/2017: This is a 54 year old right-handed woman with a history of alcohol abuse, diabetes, hypertension, presenting for evaluation of recurrent seizures. The first seizure occurred in May 2018, she had a seizure in her sleep, then had another one before EMS arrived. Her significant other states she was in a daze and not responding in between seizures, then had another en route to the hospital. Per ER notes, she had 4 seizures that day. No focal weakness. Her CK was elevated, UDS positive for cocaine. 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  EEG normal. 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 She was seizure-free for almost a year until July 2019 when she had another convulsion. She denies any prior warning symptoms, waking up in the ambulance. At that time, her potassium and magnesium levels were very low. UDS negative. She was discharged home on Keppra but had not been taking it because she would have nausea and vomiting taking it together with her other medications. She had a third seizure on 11/10/2017, her significant other reports that this also occurred in sleep, she had heavy breathing with urinary incontinence. She would be confused after, no focal weakness. She was discharged home on Keppra '500mg'$  BID which she is tolerating without side effects now that she is taking medications separately. She has occasional episodes where she smells an odd/terrible smell like blood or trash. Her significant other denies any staring/unresponsive episodes except in between the seizures. She denies any gaps in time, rising epigastric sensation, focal numbness/tingling/weakness, deja vu sensations, or myoclonic jerks. She has a history of heavy alcohol use, she usually drinks 2 beers and 4 shots of liquor every night, at most 3 12-oz beers and 6 shots of liquor. She recalls that the night prior to the seizures she had more alcohol and was up until 3am. She denies any headaches, dizziness, vision changes, neck/back pain, bowel/bladder dysfunction except for occasional constipation. Her father had seizures due to alcohol. Otherwise she had a normal birth and early development.  There is no history of febrile convulsions, CNS infections such as meningitis/encephalitis, significant traumatic brain injury, neurosurgical procedures.     PAST MEDICAL HISTORY: Past Medical History:  Diagnosis Date  . GASTROESOPHAGEAL REFLUX, NO ESOPHAGITIS 04/21/2006   Qualifier: Diagnosis of  By: Benna Dunks    . GERD (gastroesophageal reflux disease) Dx 1995  . HTN (hypertension)      MEDICATIONS: Current Outpatient Medications on File Prior to Visit  Medication Sig Dispense Refill  . allopurinol (ZYLOPRIM) 300 MG tablet Take 1 tablet (300 mg total) by mouth daily. 30 tablet 6  . amLODipine (NORVASC) 10 MG tablet Take 1 tablet (10 mg total) by mouth daily. 90 tablet 1  . aspirin EC 81 MG tablet Take 1 tablet (81 mg total) by mouth daily.    Marland Kitchen atorvastatin (LIPITOR) 20 MG tablet Take 1 tablet (20 mg total) by mouth daily. 90 tablet 1  . Blood Glucose Monitoring Suppl (TRUE METRIX METER) DEVI 1 kit by Does not apply route 3 (three) times daily. CHECK BLOOD SUGAR UP TO 3 TIMES DAILY. E11.9 1 each 0  . carbamide peroxide (DEBROX) 6.5 % OTIC solution Place 5 drops into both ears 2 (two) times daily. 15 mL 1  . cetirizine (ZYRTEC) 10 MG tablet Take 1 tablet (10 mg total) by mouth daily. 30 tablet 1  . cloNIDine (CATAPRES) 0.1 MG tablet TAKE 1 TABLET (0.1 MG TOTAL) BY MOUTH AT BEDTIME AS NEEDED. FOR HOT FLASHES 30 tablet 1  . diclofenac (VOLTAREN) 75 MG EC tablet TAKE 1 TABLET (75 MG TOTAL) BY MOUTH 2 (TWO) TIMES DAILY. 60 tablet 1  . glucose blood (TRUE METRIX BLOOD GLUCOSE TEST) test strip Use as instructed 100 each 12  . levETIRAcetam (KEPPRA) 500 MG tablet Take 1 tablet (500 mg total) by mouth 2 (two) times daily. 60 tablet 11  . magnesium oxide (MAG-OX) 400 MG tablet TAKE 1 TABLET (400 MG TOTAL) BY MOUTH DAILY. 30 tablet 3  . metFORMIN (GLUCOPHAGE) 500 MG tablet Take 0.5 tablets (250 mg total) by mouth daily with breakfast. 45 tablet 6  . metroNIDAZOLE (METROGEL VAGINAL) 0.75 % vaginal gel Place 1 Applicatorful vaginally at bedtime. (Patient not taking: Reported on 06/07/2019) 70 g 0  . pantoprazole (PROTONIX) 20 MG tablet TAKE 1 TABLET BY MOUTH 2 TIMES DAILY BEFORE A MEAL 60 tablet 2  . tiZANidine (ZANAFLEX) 4 MG tablet Take 1 tablet (4 mg total) by mouth every 8 (eight) hours as needed for muscle spasms. 60 tablet 1  . TRUEplus Lancets 28G MISC CHECK BLOOD SUGAR UP TO 3  TIMES DAILY. E11.9 100 each 11   No current facility-administered medications on file prior to visit.    ALLERGIES: Allergies  Allergen Reactions  . Losartan Potassium     Rash  . Levaquin [Levofloxacin In D5w] Rash  . Ace Inhibitors Cough    REACTION: cough  . Codeine Nausea Only  . Cefepime Rash    09/04/12 pm Patient started to break out with small macules after IV Vanco infusion, then macules increased in size after starting cefepime infusion.  . Vancomycin Rash    09/04/12 pm Patient started to break out with small macules after IV Vanco infusion, then macules increased in size after starting cefepime infusion.    FAMILY HISTORY: Family History  Problem Relation Age of Onset  . CAD Mother     SOCIAL HISTORY: Social History   Socioeconomic History  . Marital status: Single    Spouse name: Not on file  . Number of children: Not on file  . Years of education: Not on file  . Highest education level: Not on file  Occupational History  .  Not on file  Tobacco Use  . Smoking status: Former Smoker    Quit date: 06/25/2012    Years since quitting: 7.2  . Smokeless tobacco: Never Used  Vaping Use  . Vaping Use: Never used  Substance and Sexual Activity  . Alcohol use: Yes    Alcohol/week: 3.0 standard drinks    Types: 3 Standard drinks or equivalent per week    Comment: ADMITS TO DRINKING 3-4 BEERS/DAY  . Drug use: No  . Sexual activity: Not Currently  Other Topics Concern  . Not on file  Social History Narrative   Pt lives in single story home with her partner   Has 1 son   5 grandchildren   10th grade education   Works at [employer]    Social Determinants of Health   Financial Resource Strain:   . Difficulty of Paying Living Expenses:   Food Insecurity:   . Worried About Charity fundraiser in the Last Year:   . Arboriculturist in the Last Year:   Transportation Needs:   . Film/video editor (Medical):   Marland Kitchen Lack of Transportation (Non-Medical):    Physical Activity:   . Days of Exercise per Week:   . Minutes of Exercise per Session:   Stress:   . Feeling of Stress :   Social Connections:   . Frequency of Communication with Friends and Family:   . Frequency of Social Gatherings with Friends and Family:   . Attends Religious Services:   . Active Member of Clubs or Organizations:   . Attends Archivist Meetings:   Marland Kitchen Marital Status:   Intimate Partner Violence:   . Fear of Current or Ex-Partner:   . Emotionally Abused:   Marland Kitchen Physically Abused:   . Sexually Abused:     PHYSICAL EXAM: Vitals:   09/28/19 1608  BP: 109/74  Pulse: 94  SpO2: 100%   General: No acute distress Head:  Normocephalic/atraumatic Skin/Extremities: No rash, no edema Neurological Exam: alert and oriented to person, place, and time. No aphasia or dysarthria. Fund of knowledge is appropriate.  Recent and remote memory are intact.  Attention and concentration are normal.   Cranial nerves: Pupils equal, round, reactive to light.Extraocular movements intact with no nystagmus. Visual fields full.No facial asymmetry.  Motor: Bulk and tone normal, muscle strength 5/5 throughout with no pronator drift.  Finger to nose testing intact.  Gait narrow-based and steady, able to tandem walk adequately.  Romberg negative.   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 She denies any seizures since May 2020, no side effects on Levetiracetam '500mg'$  BID. We again discussed avoidance of seizure triggers, including missing medication, sleep deprivation, and alcohol reduction/eventual cessation. She reports good compliance to medications. She is aware of [place] driving laws to stop driving after a seizure until 6 months seizure-free, and is awaiting DMV determination. Follow-up in 6 months, she knows to call for any changes.    Thank you for allowing me to participate in her care.  Please do not hesitate to call for any questions or concerns.   Ellouise Newer, M.D.   CC: Dr. Margarita Rana

## 2019-09-28 NOTE — Patient Instructions (Signed)
Always good to see you. Continue Keppra 500mg  twice a day. Continue with sleep hygiene and cutting down on alcohol. Follow-up in 6 months, call for any changes.  Seizure Precautions: 1. If medication has been prescribed for you to prevent seizures, take it exactly as directed.  Do not stop taking the medicine without talking to your doctor first, even if you have not had a seizure in a long time.   2. Avoid activities in which a seizure would cause danger to yourself or to others.  Don't operate dangerous machinery, swim alone, or climb in high or dangerous places, such as on ladders, roofs, or girders.  Do not drive unless your doctor says you may.  3. If you have any warning that you may have a seizure, lay down in a safe place where you can't hurt yourself.    4.  No driving for 6 months from last seizure, as per [facility]   Please refer to the following link on the Epilepsy Foundation of America's website for more information: http://www.epilepsyfoundation.org/answerplace/Social/driving/drivingu.cfm   5.  Maintain good sleep hygiene. Avoid alcohol.  6.  Contact your doctor if you have any problems that may be related to the medicine you are taking.  7.  Call 911 and bring the patient back to the ED if:        A.  The seizure lasts longer than 5 minutes.       B.  The patient doesn't awaken shortly after the seizure  C.  The patient has new problems such as difficulty seeing, speaking or moving  D.  The patient was injured during the seizure  E.  The patient has a temperature over 102 F (39C)  F.  The patient vomited and now is having trouble breathing

## 2019-10-04 ENCOUNTER — Other Ambulatory Visit: Payer: Self-pay | Admitting: Family Medicine

## 2019-10-04 MED FILL — DICLOFENAC SOD EC 75 MG TAB: 75 | 30 days supply | Qty: 60 | Fill #0

## 2019-10-04 MED FILL — AMLODIPINE BESYLATE 10 MG T: 10 | 30 days supply | Qty: 30 | Fill #3

## 2019-10-04 MED FILL — PANTOPRAZOLE SOD DR 20 MG T: 20 | 30 days supply | Qty: 60 | Fill #0

## 2019-10-04 MED FILL — MAGNESIUM OXIDE 400 MG TAB: 400 | 30 days supply | Qty: 30 | Fill #2

## 2019-10-04 MED FILL — levETIRAcetam 500 MG TABS: 500 | 30 days supply | Qty: 60 | Fill #3

## 2019-10-04 MED FILL — ?ATORVASTATIN 20 MG TABLET: 20 | 30 days supply | Qty: 30 | Fill #2

## 2019-10-04 MED FILL — ?CETIRIZINE HCL 10 MG TABLE: 10 | 30 days supply | Qty: 30 | Fill #1

## 2019-10-04 MED FILL — ?ALLOPURINOL 300 MG TABLET: 300 | 30 days supply | Qty: 30 | Fill #1

## 2019-10-04 MED FILL — METFORMIN HCL 500 MG TABS: 500 | 30 days supply | Qty: 15 | Fill #1

## 2019-11-01 MED FILL — ?ATORVASTATIN 20 MG TABLET: 20 | 30 days supply | Qty: 30 | Fill #3

## 2019-11-01 MED FILL — TRUEplus LANCETS 28G MISC: 30 days supply | Qty: 100 | Fill #5

## 2019-11-01 MED FILL — TRUE METRIX TEST STRIP: 25 days supply | Qty: 100 | Fill #5

## 2019-11-01 MED FILL — PANTOPRAZOLE SOD DR 20 MG T: 20 | 30 days supply | Qty: 60 | Fill #1

## 2019-11-01 MED FILL — levETIRAcetam 500 MG TABS: 500 | 30 days supply | Qty: 60 | Fill #4

## 2019-11-01 MED FILL — DICLOFENAC SOD EC 75 MG TAB: 75 | 30 days supply | Qty: 60 | Fill #1

## 2019-11-01 MED FILL — MAGNESIUM OXIDE 400 MG TAB: 400 | 30 days supply | Qty: 30 | Fill #3

## 2019-11-01 MED FILL — METFORMIN HCL 500 MG TABS: 500 | 30 days supply | Qty: 15 | Fill #2

## 2019-11-01 MED FILL — AMLODIPINE BESYLATE 10 MG T: 10 | 30 days supply | Qty: 30 | Fill #4

## 2019-11-13 ENCOUNTER — Encounter (INDEPENDENT_AMBULATORY_CARE_PROVIDER_SITE_OTHER): Payer: Self-pay

## 2019-11-13 ENCOUNTER — Ambulatory Visit: Payer: Self-pay | Attending: Family Medicine | Admitting: Family Medicine

## 2019-11-13 ENCOUNTER — Other Ambulatory Visit: Payer: Self-pay

## 2019-11-13 ENCOUNTER — Encounter: Payer: Self-pay | Admitting: Family Medicine

## 2019-11-13 VITALS — BP 100/69 | HR 100 | Ht 62.0 in | Wt 150.8 lb

## 2019-11-13 DIAGNOSIS — G5603 Carpal tunnel syndrome, bilateral upper limbs: Secondary | ICD-10-CM

## 2019-11-13 DIAGNOSIS — M549 Dorsalgia, unspecified: Secondary | ICD-10-CM

## 2019-11-13 DIAGNOSIS — Z23 Encounter for immunization: Secondary | ICD-10-CM

## 2019-11-13 DIAGNOSIS — E119 Type 2 diabetes mellitus without complications: Secondary | ICD-10-CM

## 2019-11-13 LAB — POCT GLYCOSYLATED HEMOGLOBIN (HGB A1C): HbA1c, POC (controlled diabetic range): 5.1 % (ref 0.0–7.0)

## 2019-11-13 LAB — GLUCOSE, POCT (MANUAL RESULT ENTRY): POC Glucose: 82 mg/dl (ref 70–99)

## 2019-11-13 MED ORDER — LIDOCAINE 5 % EX PTCH: 1.0000 | MEDICATED_PATCH | CUTANEOUS | 0 refills | Status: DC

## 2019-11-13 MED FILL — ?LIDOCAINE 5% PATCH: 5 | 30 days supply | Qty: 30 | Fill #0

## 2019-11-13 NOTE — Progress Notes (Signed)
Subjective:  Patient ID: Christina Byrd, female    DOB: 09-30-1965  Age: 54 y.o. MRN: [id number]  CC: Diabetes   HPI [name] is a 54 year old female with a history of type 2 diabetes mellitus (A1c 5.1), GERD, Seizures here for an acute visit.  She complains of back muscle spasms which are  Persistent and uncontrolled on Tizanidine, described as severe. Denies radiation down her lower extremities. Symptoms are chronic. She has noticed dropping things from her R hand. Endorses presence of numbness in both hands. Also complains of pain in both hands.  Past Medical History:  Diagnosis Date   GASTROESOPHAGEAL REFLUX, NO ESOPHAGITIS 04/21/2006   Qualifier: Diagnosis of  By: Benna Dunks     GERD (gastroesophageal reflux disease) Dx 1995   HTN (hypertension)     History reviewed. No pertinent surgical history.  Family History  Problem Relation Age of Onset   CAD Mother     Allergies  Allergen Reactions   Losartan Potassium     Rash   Levaquin [Levofloxacin In D5w] Rash   Ace Inhibitors Cough    REACTION: cough   Codeine Nausea Only   Cefepime Rash    09/04/12 pm Patient started to break out with small macules after IV Vanco infusion, then macules increased in size after starting cefepime infusion.   Vancomycin Rash    09/04/12 pm Patient started to break out with small macules after IV Vanco infusion, then macules increased in size after starting cefepime infusion.    Outpatient Medications Prior to Visit  Medication Sig Dispense Refill   allopurinol (ZYLOPRIM) 300 MG tablet Take 1 tablet (300 mg total) by mouth daily. 30 tablet 6   amLODipine (NORVASC) 10 MG tablet Take 1 tablet (10 mg total) by mouth daily. 90 tablet 1   aspirin EC 81 MG tablet Take 1 tablet (81 mg total) by mouth daily.     atorvastatin (LIPITOR) 20 MG tablet Take 1 tablet (20 mg total) by mouth daily. 90 tablet 1   Blood Glucose Monitoring Suppl (TRUE METRIX METER) DEVI 1 kit  by Does not apply route 3 (three) times daily. CHECK BLOOD SUGAR UP TO 3 TIMES DAILY. E11.9 1 each 0   cetirizine (ZYRTEC) 10 MG tablet Take 1 tablet (10 mg total) by mouth daily. 30 tablet 1   cloNIDine (CATAPRES) 0.1 MG tablet TAKE 1 TABLET (0.1 MG TOTAL) BY MOUTH AT BEDTIME AS NEEDED. FOR HOT FLASHES 30 tablet 1   diclofenac (VOLTAREN) 75 MG EC tablet TAKE 1 TABLET (75 MG TOTAL) BY MOUTH 2 (TWO) TIMES DAILY. 60 tablet 1   glucose blood (TRUE METRIX BLOOD GLUCOSE TEST) test strip Use as instructed 100 each 12   levETIRAcetam (KEPPRA) 500 MG tablet Take 1 tablet (500 mg total) by mouth 2 (two) times daily. 60 tablet 11   magnesium oxide (MAG-OX) 400 MG tablet TAKE 1 TABLET (400 MG TOTAL) BY MOUTH DAILY. 30 tablet 3   metFORMIN (GLUCOPHAGE) 500 MG tablet Take 0.5 tablets (250 mg total) by mouth daily with breakfast. 45 tablet 6   pantoprazole (PROTONIX) 20 MG tablet TAKE 1 TABLET BY MOUTH 2 TIMES DAILY BEFORE A MEAL 60 tablet 2   tiZANidine (ZANAFLEX) 4 MG tablet Take 1 tablet (4 mg total) by mouth every 8 (eight) hours as needed for muscle spasms. 60 tablet 1   TRUEplus Lancets 28G MISC CHECK BLOOD SUGAR UP TO 3 TIMES DAILY. E11.9 100 each 11   metroNIDAZOLE (METROGEL VAGINAL) 0.75 %  vaginal gel Place 1 Applicatorful vaginally at bedtime. (Patient not taking: Reported on 06/07/2019) 70 g 0   No facility-administered medications prior to visit.     ROS Review of Systems  Constitutional: Negative for activity change, appetite change and fatigue.  HENT: Negative for congestion, sinus pressure and sore throat.   Eyes: Negative for visual disturbance.  Respiratory: Negative for cough, chest tightness, shortness of breath and wheezing.   Cardiovascular: Negative for chest pain and palpitations.  Gastrointestinal: Negative for abdominal distention, abdominal pain and constipation.  Endocrine: Negative for polydipsia.  Genitourinary: Negative for dysuria and frequency.  Musculoskeletal:  Positive for back pain. Negative for arthralgias.  Skin: Negative for rash.  Neurological: Positive for numbness. Negative for tremors and light-headedness.  Hematological: Does not bruise/bleed easily.  Psychiatric/Behavioral: Negative for agitation and behavioral problems.    Objective:  BP 100/69    Pulse 100    Ht $R'5\' 2"'Bn$  (1.575 m)    Wt 150 lb 12.8 oz (68.4 kg)    LMP 12/17/2014    SpO2 98%    BMI 27.58 kg/m   BP/Weight 11/13/2019 05/26/100 08/23/5364  Systolic BP 440 347 425  Diastolic BP 69 74 81  Wt. (Lbs) 150.8 155.8 155  BMI 27.58 28.5 28.35      Physical Exam Constitutional:      Appearance: She is well-developed.  Neck:     Vascular: No JVD.  Cardiovascular:     Rate and Rhythm: Normal rate.     Heart sounds: Normal heart sounds. No murmur heard.   Pulmonary:     Effort: Pulmonary effort is normal.     Breath sounds: Normal breath sounds. No wheezing or rales.  Chest:     Chest wall: No tenderness.  Abdominal:     General: Bowel sounds are normal. There is no distension.     Palpations: Abdomen is soft. There is no mass.     Tenderness: There is no abdominal tenderness.  Musculoskeletal:        General: Tenderness (TTP of entire lumbar spine) present. Normal range of motion.     Right lower leg: No edema.     Left lower leg: No edema.     Comments: Reduced hand grip b/l Able to make fists b/l Negative phalen and Tinel's sign TTP of both hands  Neurological:     Mental Status: She is alert and oriented to person, place, and time.  Psychiatric:        Mood and Affect: Mood normal.     CMP Latest Ref Rng & Units 04/24/2019 10/04/2018 05/01/2018  Glucose 65 - 99 mg/dL 98 86 158(H)  BUN 6 - 24 mg/dL $Remove'10 13 13  'hiUIVBg$ Creatinine 0.57 - 1.00 mg/dL 0.92 0.95 0.93  Sodium 134 - 144 mmol/L 143 142 145(H)  Potassium 3.5 - 5.2 mmol/L 4.2 4.2 3.9  Chloride 96 - 106 mmol/L 102 103 104  CO2 20 - 29 mmol/L 23 21 17(L)  Calcium 8.7 - 10.2 mg/dL 9.4 8.3(L) 9.0  Total Protein 6.5  - 8.1 g/dL - - -  Total Bilirubin 0.3 - 1.2 mg/dL - - -  Alkaline Phos 38 - 126 U/L - - -  AST 15 - 41 U/L - - -  ALT 0 - 44 U/L - - -    Lipid Panel     Component Value Date/Time   CHOL 161 06/22/2017 1041   TRIG 253 (H) 06/22/2017 1041   HDL 44 06/22/2017 1041   CHOLHDL  3.7 06/22/2017 1041   CHOLHDL 2.4 10/26/2012 1052   VLDL 46 (H) 10/26/2012 1052   LDLCALC 66 06/22/2017 1041   LDLDIRECT 153 (H) 01/30/2008 2146    CBC    Component Value Date/Time   WBC 7.4 04/24/2019 1459   WBC 8.7 11/10/2017 1144   RBC 4.13 04/24/2019 1459   RBC 4.36 11/10/2017 1144   HGB 12.9 04/24/2019 1459   HCT 38.5 04/24/2019 1459   PLT 264 04/24/2019 1459   MCV 93 04/24/2019 1459   MCH 31.2 04/24/2019 1459   MCH 29.4 11/10/2017 1144   MCHC 33.5 04/24/2019 1459   MCHC 32.3 11/10/2017 1144   RDW 14.8 04/24/2019 1459   LYMPHSABS 2.1 04/24/2019 1459   MONOABS 0.5 11/10/2017 1144   EOSABS 0.1 04/24/2019 1459   BASOSABS 0.0 04/24/2019 1459    Lab Results  Component Value Date   HGBA1C 5.1 11/13/2019    Assessment & Plan:  1. Controlled type 2 diabetes mellitus without complication, without long-term current use of insulin (HCC) Controlled with A1c of 5.1 - POCT glucose (manual entry) - POCT glycosylated hemoglobin (Hb A1C)  2. Bilateral carpal tunnel syndrome Uncontrolled on current regimen - AMB referral to orthopedics  3. Musculoskeletal back pain Uncontrolled on current regimen Lidoderm patches added Apply heat - Ambulatory referral to Physical Therapy - lidocaine (LIDODERM) 5 %; Place 1 patch onto the skin daily. Remove & Discard patch within 12 hours or as directed by MD  Dispense: 30 patch; Refill: 0  4. Need for immunization against influenza - Flu Vaccine QUAD 36+ mos IM    Meds ordered this encounter  Medications   lidocaine (LIDODERM) 5 %    Sig: Place 1 patch onto the skin daily. Remove & Discard patch within 12 hours or as directed by MD    Dispense:  30 patch      Refill:  0    Follow-up: Return for medical conditions, keep previously scheduled appointment.       Charlott Rakes, MD, FAAFP. [facility], [place]   11/13/2019, 10:43 AM

## 2019-11-13 NOTE — Progress Notes (Signed)
Hands are shaking.  Still having spasms in back.

## 2019-11-14 ENCOUNTER — Ambulatory Visit (INDEPENDENT_AMBULATORY_CARE_PROVIDER_SITE_OTHER): Payer: Self-pay | Admitting: Physician Assistant

## 2019-11-14 ENCOUNTER — Other Ambulatory Visit: Payer: Self-pay | Admitting: Radiology

## 2019-11-14 ENCOUNTER — Encounter: Payer: Self-pay | Admitting: Physician Assistant

## 2019-11-14 DIAGNOSIS — R200 Anesthesia of skin: Secondary | ICD-10-CM

## 2019-11-14 NOTE — Progress Notes (Signed)
Office Visit Note   Patient: Christina Byrd           Date of Birth: 09-18-1965           MRN: [id number] Visit Date: 11/14/2019              Requested by: Hoy Register, MD [address] [place],  [place] [address] PCP: Hoy Register, MD   Assessment & Plan: Visit Diagnoses:  1. Bilateral hand numbness     Plan: We will send her for EMG nerve conduction studies of bilateral upper extremities rule out carpal tunnel syndrome.  Have her follow-up after the EMG nerve conduction studies to go over results and discuss further treatment.  Follow-Up Instructions: Return After EMG/NCS.   Orders:  No orders of the defined types were placed in this encounter.  No orders of the defined types were placed in this encounter.     Procedures: No procedures performed   Clinical Data: No additional findings.   Subjective: Chief Complaint  Patient presents with  . Right Hand - Pain, Numbness  . Left Hand - Pain, Numbness    HPI Mrs. Montoya is a 54 year old female were seen for the first time for bilateral hand numbness tingling and dropping of items worse in the left hand.  She is seen at request of Dr.Newlin her primary care physician.  She states that her fingers feel mostly numb at night has some pain that does radiate up to both arms.  Pain does not awaken her.  She does have to shake her hands when of her hands go numb.  Again drops objects and notes that she has only slight shaking sensation particularly with the right hand.  She is diabetic with reported good control.  She has had no treatment for the numbness in her hands.  Review of Systems See HPI otherwise negative.  Objective: Vital Signs: LMP 12/17/2014   Physical Exam Constitutional:      Appearance: She is not ill-appearing.  Pulmonary:     Effort: Pulmonary effort is normal.  Neurological:     Mental Status: She is alert and oriented to person, place, and time.  Psychiatric:        Mood and  Affect: Mood normal.   Vascular: Radial pulses are 2+ compartment soft throughout forearms.  Ortho Exam Bilateral hands full motor.  Subjective decreased sensation involving the left hand median nerve distribution.  Negative Tinel's negative Phalen's and compression test over the median nerve at the wrist bilaterally.  However with compression test both hands right greater than right she has a spasming almost tremor like movement.  There is no muscle atrophy noted of either hand.  No rashes or skin lesions bilateral hands. Specialty Comments:  No specialty comments available.  Imaging: No results found.   PMFS History: Patient Active Problem List   Diagnosis Date Noted  . Hypomagnesemia 03/14/2018  . Type 2 diabetes mellitus (HCC) 09/13/2016  . Depression 07/08/2016  . Seizure (HCC) 07/08/2016  . Cocaine use   . Hot flash, menopausal 06/22/2016  . Intertrigo 06/22/2016  . Gout of left ankle 10/09/2015  . Left hip pain 09/02/2015  . EtOH dependence (HCC) 09/02/2015  . Colonoscopy refused 09/02/2015  . Hyperuricemia 05/27/2015  . 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 Pain and swelling of left ankle 12/02/2014  . Tendinitis of right hip flexor 07/23/2014  . Allergic rhinitis 05/13/2014  . GERD (gastroesophageal reflux disease) 04/01/2014  . Tendonitis, Achilles,  right 04/01/2014  . Osteoarthritis of left knee 12/11/2013  . Ex-smoker 11/09/2008  . OBESITY 08/12/2006  . HYPERTENSION, BENIGN ESSENTIAL 08/12/2006   Past Medical History:  Diagnosis Date  . GASTROESOPHAGEAL REFLUX, NO ESOPHAGITIS 04/21/2006   Qualifier: Diagnosis of  By: Levada Schilling    . GERD (gastroesophageal reflux disease) Dx 1995  . HTN (hypertension)     Family History  Problem Relation Age of Onset  . CAD Mother     History reviewed. No pertinent surgical history. Social History   Occupational History  . Not on file  Tobacco Use  . Smoking status: Former Smoker    Quit date:  06/25/2012    Years since quitting: 7.3  . Smokeless tobacco: Never Used  Vaping Use  . Vaping Use: Never used  Substance and Sexual Activity  . Alcohol use: Yes    Alcohol/week: 3.0 standard drinks    Types: 3 Standard drinks or equivalent per week    Comment: ADMITS TO DRINKING 3-4 BEERS/DAY  . Drug use: No  . Sexual activity: Not Currently

## 2019-11-15 ENCOUNTER — Telehealth (INDEPENDENT_AMBULATORY_CARE_PROVIDER_SITE_OTHER): Payer: Self-pay | Admitting: Family Medicine

## 2019-11-15 NOTE — Telephone Encounter (Signed)
Patient was called and all questions were answered.

## 2019-11-15 NOTE — Telephone Encounter (Signed)
Copied from CRM [id number]. Topic: General - Inquiry >> Nov 15, 2019 11:32 AM Leafy Ro wrote: reason for CRM: pt did not elaborate the reason she would like dr Alvis Lemmings nurse Elease Hashimoto to return her all

## 2019-11-16 ENCOUNTER — Ambulatory Visit: Payer: Self-pay | Attending: Family Medicine

## 2019-11-16 ENCOUNTER — Other Ambulatory Visit: Payer: Self-pay

## 2019-11-22 ENCOUNTER — Telehealth: Payer: Self-pay

## 2019-11-22 NOTE — Telephone Encounter (Signed)
Believe she's returning y'alls call

## 2019-11-22 NOTE — Telephone Encounter (Signed)
Patient said she was advised to call us regarding getting a shock in her hand????

## 2019-11-23 NOTE — Telephone Encounter (Signed)
Returned patient's call, but voicemail is not set up. Unsure what patient is calling for. She is scheduled for NCV with Dr. Alvester Morin.

## 2019-11-27 ENCOUNTER — Telehealth: Payer: Self-pay | Admitting: Family Medicine

## 2019-11-27 NOTE — Telephone Encounter (Signed)
Pt was explain that we need the IRS documents to apply for the CAFA, she need to wait until she get it

## 2019-11-27 NOTE — Telephone Encounter (Signed)
  Please f /u with the patient.  Copied from CRM [phone]. Topic: General - Inquiry >> Nov 27, 2019 12:34 PM Leafy Ro wrote: Reason for CRM:Pt is calling and would like to talk with carlos concerning orange card

## 2019-12-03 ENCOUNTER — Other Ambulatory Visit: Payer: Self-pay | Admitting: Family Medicine

## 2019-12-03 ENCOUNTER — Other Ambulatory Visit: Payer: Self-pay

## 2019-12-03 ENCOUNTER — Ambulatory Visit: Payer: No Typology Code available for payment source | Attending: Family Medicine | Admitting: Physical Therapy

## 2019-12-03 DIAGNOSIS — G8929 Other chronic pain: Secondary | ICD-10-CM

## 2019-12-03 DIAGNOSIS — M545 Low back pain, unspecified: Secondary | ICD-10-CM | POA: Insufficient documentation

## 2019-12-03 MED FILL — DICLOFENAC SOD EC 75 MG TAB: 75 | 30 days supply | Qty: 60 | Fill #0

## 2019-12-03 MED FILL — PANTOPRAZOLE SOD DR 20 MG T: 20 | 30 days supply | Qty: 60 | Fill #2

## 2019-12-03 MED FILL — ?METFORMIN HCL 500MG TABL: 500 | 30 days supply | Qty: 15 | Fill #3

## 2019-12-03 MED FILL — ATORVASTATIN CALCIUM 20 MG: 20 | 30 days supply | Qty: 30 | Fill #4

## 2019-12-03 MED FILL — AMLODIPINE BESYLATE 10 MG T: 10 | 30 days supply | Qty: 30 | Fill #5

## 2019-12-03 MED FILL — levETIRAcetam 500 MG TABS: 500 | 30 days supply | Qty: 60 | Fill #5

## 2019-12-03 MED FILL — MAGNESIUM OXIDE 400 MG TAB: 400 | 30 days supply | Qty: 30 | Fill #0

## 2019-12-03 NOTE — Therapy (Signed)
[facility] [address] [place], [place], [address] Phone: [phone]   Fax:  [phone]  Physical Therapy Evaluation  Patient Details  Name: Christina Byrd MRN: [id number] Date of Birth: Feb 11, 1966 No data recorded  Encounter Date: 12/03/2019   PT End of Session - 12/03/19 1525    Visit Number 1    PT Start Time 1512           Past Medical History:  Diagnosis Date  . GASTROESOPHAGEAL REFLUX, NO ESOPHAGITIS 04/21/2006   Qualifier: Diagnosis of  By: Levada Schilling    . GERD (gastroesophageal reflux disease) Dx 1995  . HTN (hypertension)     No past surgical history on file.  There were no vitals filed for this visit.    Subjective Assessment - 12/03/19 1520    Subjective Patient arrived for physical therapy evaluation but reported that her back pain is no longer present and her hands are currently her primary concern. Physical therapist explained to patient that her referral to physical therapy is to address her musculoskeletal back pain and provided patient with information regarding her upcoming appointment with Dr. Alvester Morin to address her complaints about bilateral hand numbness. Patient opted not to be seen for back pain today.                          Objective measurements completed on examination: See above findings.                             Patient will benefit from skilled therapeutic intervention in order to improve the following deficits and impairments:     Visit Diagnosis: Chronic low back pain, unspecified back pain laterality, unspecified whether sciatica present     Problem List Patient Active Problem List   Diagnosis Date Noted  . Hypomagnesemia 03/14/2018  . Type 2 diabetes mellitus (HCC) 09/13/2016  . Depression 07/08/2016  . Seizure (HCC) 07/08/2016  . Cocaine use   . Hot flash, menopausal 06/22/2016  . Intertrigo 06/22/2016  . Gout of left  ankle 10/09/2015  . Left hip pain 09/02/2015  . EtOH dependence (HCC) 09/02/2015  . Colonoscopy refused 09/02/2015  . Hyperuricemia 05/27/2015  . 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 Pain and swelling of left ankle 12/02/2014  . Tendinitis of right hip flexor 07/23/2014  . Allergic rhinitis 05/13/2014  . GERD (gastroesophageal reflux disease) 04/01/2014  . Tendonitis, Achilles, right 04/01/2014  . Osteoarthritis of left knee 12/11/2013  . Ex-smoker 11/09/2008  . OBESITY 08/12/2006  . HYPERTENSION, BENIGN ESSENTIAL 08/12/2006   Rhea Bleacher, PT, DPT 12/03/19 3:26 PM  [facility] [address] [place], [place], [address] Phone: [phone]   Fax:  [phone]  Name: FRANCELLA BARNETT MRN: [id number] Date of Birth: December 07, 1965

## 2019-12-03 NOTE — Telephone Encounter (Signed)
Requested medication (s) are due for refill today: yes  Requested medication (s) are on the active medication list: yes  Last refill:  07/27/19  Future visit scheduled: yes  Notes to clinic:  overdue labs   Requested Prescriptions  Pending Prescriptions Disp Refills   magnesium oxide (MAG-OX) 400 MG tablet [Pharmacy Med Name: MAGNESIUM OXIDE 400 MG TAB 400 Tablet] 30 tablet     Sig: TAKE 1 TABLET (400 MG TOTAL) BY MOUTH DAILY.      Endocrinology:  Minerals - Magnesium Supplementation Failed - 12/03/2019  9:10 AM      Failed - Mg Level in normal range and within 360 days    Magnesium  Date Value Ref Range Status  11/10/2017 1.5 (L) 1.7 - 2.4 mg/dL Final    Comment:    Performed at [facility], [address] [place], [place] [address]          Passed - Valid encounter within last 12 months    Recent Outpatient Visits           2 weeks ago Controlled type 2 diabetes mellitus without complication, without long-term current use of insulin (HCC)   [facility] Hoy Register, MD   3 months ago Sinus drainage   [facility] [facility] [place], Odette Horns, MD   5 months ago Fasciculations of muscle   [facility] Hoy Register, MD   6 months ago Encounter for medication counseling   [facility] [place], Cornelius Moras, RPH-CPP   7 months ago Controlled type 2 diabetes mellitus without complication, without long-term current use of insulin [facility])   [place] [facility] [facility] Hoy Register, MD       Future Appointments             In 3 months Hoy Register, MD [facility]   In 4 months Wendall Stade, MD [facility] [address] Office, [facility]             Signed Prescriptions Disp Refills   diclofenac (VOLTAREN) 75 MG EC tablet 60 tablet 1    Sig: TAKE 1 TABLET (75 MG TOTAL) BY MOUTH 2 (TWO) TIMES DAILY.       Analgesics:  NSAIDS Passed - 12/03/2019  9:10 AM      Passed - Cr in normal range and within 360 days    Creat  Date Value Ref Range Status  03/21/2014 1.00 0.50 - 1.10 mg/dL Final   Creatinine, Ser  Date Value Ref Range Status  04/24/2019 0.92 0.57 - 1.00 mg/dL Final   Creatinine, POC  Date Value Ref Range Status  03/25/2017 300 mg/dL Final          Passed - HGB in normal range and within 360 days    Hemoglobin  Date Value Ref Range Status  04/24/2019 12.9 11.1 - 15.9 g/dL Final          Passed - Patient is not pregnant      Passed - Valid encounter within last 12 months    Recent Outpatient Visits           2 weeks ago Controlled type 2 diabetes mellitus without complication, without long-term current use of insulin (HCC)   [place] [facility] Hoy Register, MD   3 months ago Sinus drainage   [place] [facility] Hoy Register, MD  5 months ago Fasciculations of muscle   [place] [facility] [facility] Hoy Register, MD   6 months ago Encounter for medication counseling   [facility] [place], Cornelius Moras, RPH-CPP   7 months ago Controlled type 2 diabetes mellitus without complication, without long-term current use of insulin [facility])   [place] [facility] Hoy Register, MD       Future Appointments             In 3 months Hoy Register, MD [facility]   In 4 months [place] Emms, Noralyn Pick, MD [facility] Office, [facility]

## 2019-12-03 NOTE — Telephone Encounter (Signed)
Requested Prescriptions  Pending Prescriptions Disp Refills  . magnesium oxide (MAG-OX) 400 MG tablet [Pharmacy Med Name: MAGNESIUM OXIDE 400 MG TAB 400 Tablet] 30 tablet     Sig: TAKE 1 TABLET (400 MG TOTAL) BY MOUTH DAILY.     Endocrinology:  Minerals - Magnesium Supplementation Failed - 12/03/2019  9:10 AM      Failed - Mg Level in normal range and within 360 days    Magnesium  Date Value Ref Range Status  11/10/2017 1.5 (L) 1.7 - 2.4 mg/dL Final    Comment:    Performed at [facility], [address] [place], [place] [address]         Passed - Valid encounter within last 12 months    Recent Outpatient Visits          2 weeks ago Controlled type 2 diabetes mellitus without complication, without long-term current use of insulin (HCC)   [place] [facility] Hoy Register, MD   3 months ago Sinus drainage   [place] [facility] [place], Odette Horns, MD   5 months ago Fasciculations of muscle   [place] [facility] Hoy Register, MD   6 months ago Encounter for medication counseling   [facility] [place], Jeannett Senior L, RPH-CPP   7 months ago Controlled type 2 diabetes mellitus without complication, without long-term current use of insulin (HCC)   [place] [facility] Hoy Register, MD      Future Appointments            In 3 months Hoy Register, MD [facility]   In 4 months [place], Noralyn Pick, MD [facility] [address] Office, [facility]           . diclofenac (VOLTAREN) 75 MG EC tablet [Pharmacy Med Name: DICLOFENAC SOD EC 75 MG TAB 75 Tablet] 60 tablet 1    Sig: TAKE 1 TABLET (75 MG TOTAL) BY MOUTH 2 (TWO) TIMES DAILY.     Analgesics:  NSAIDS Passed - 12/03/2019  9:10 AM      Passed - Cr in normal range and within 360 days    Creat  Date Value Ref Range Status  03/21/2014 1.00 0.50 - 1.10 mg/dL Final    Creatinine, Ser  Date Value Ref Range Status  04/24/2019 0.92 0.57 - 1.00 mg/dL Final   Creatinine, POC  Date Value Ref Range Status  03/25/2017 300 mg/dL Final         Passed - HGB in normal range and within 360 days    Hemoglobin  Date Value Ref Range Status  04/24/2019 12.9 11.1 - 15.9 g/dL Final         Passed - Patient is not pregnant      Passed - Valid encounter within last 12 months    Recent Outpatient Visits          2 weeks ago Controlled type 2 diabetes mellitus without complication, without long-term current use of insulin (HCC)   [place] [facility] Hoy Register, MD   3 months ago Sinus drainage   [place] [facility] [place], Odette Horns, MD   5 months ago Fasciculations of muscle   [place] [facility] Hoy Register, MD   6 months ago Encounter for medication counseling   [facility] [place], Cornelius Moras, RPH-CPP   7  months ago Controlled type 2 diabetes mellitus without complication, without long-term current use of insulin (HCC)   [place] [facility] Hoy Register, MD      Future Appointments            In 3 months Hoy Register, MD [facility]ness   In 4 months Eden Emms, Noralyn Pick, MD [facility] Office, [facility]

## 2019-12-04 ENCOUNTER — Other Ambulatory Visit: Payer: Self-pay | Admitting: Family Medicine

## 2019-12-04 ENCOUNTER — Ambulatory Visit: Payer: Medicaid Other | Attending: Family Medicine | Admitting: Family Medicine

## 2019-12-04 ENCOUNTER — Encounter: Payer: Self-pay | Admitting: Family Medicine

## 2019-12-04 VITALS — BP 103/67 | HR 90 | Ht 62.0 in | Wt 150.0 lb

## 2019-12-04 DIAGNOSIS — M1A0610 Idiopathic chronic gout, right knee, without tophus (tophi): Secondary | ICD-10-CM

## 2019-12-04 MED ORDER — PREDNISONE 20 MG PO TABS: 20.0000 mg | ORAL_TABLET | Freq: Two times a day (BID) | ORAL | 0 refills | Status: DC

## 2019-12-04 MED ORDER — ALLOPURINOL 300 MG PO TABS: 300.0000 mg | ORAL_TABLET | Freq: Every day | ORAL | 6 refills | Status: DC

## 2019-12-04 MED FILL — predniSONE 20 MG TABS: 20 | 5 days supply | Qty: 10 | Fill #0

## 2019-12-04 MED FILL — ALLOPURINOL 300 MG TAB: 300 | 30 days supply | Qty: 30 | Fill #0

## 2019-12-04 NOTE — Progress Notes (Signed)
Subjective:  Patient ID: Christina Byrd, female    DOB: April 25, 1965  Age: 54 y.o. MRN: [id number]  CC: Gout   HPI Christina Byrd is a 54 year old female with a history of type 2 diabetes mellitus (A1c 5.1), GERD, Seizureshere for an acute visit.  One week ago she developed pain and swelling in R foot followed by her R knee. Foot symptoms have resolved but knee persists ans she finds it difficult to bear weight on her RLE. Endorses eating red meat before symptoms started and also has been eating shrimp over the last week Ran out of Allopurinol a while ago. Symptoms are typical of her Gout symptoms.  Past Medical History:  Diagnosis Date  . GASTROESOPHAGEAL REFLUX, NO ESOPHAGITIS 04/21/2006   Qualifier: Diagnosis of  By: Benna Dunks    . GERD (gastroesophageal reflux disease) Dx 1995  . HTN (hypertension)     History reviewed. No pertinent surgical history.  Family History  Problem Relation Age of Onset  . CAD Mother     Allergies  Allergen Reactions  . Losartan Potassium     Rash  . Levaquin [Levofloxacin In D5w] Rash  . Ace Inhibitors Cough    REACTION: cough  . Codeine Nausea Only  . Cefepime Rash    09/04/12 pm Patient started to break out with small macules after IV Vanco infusion, then macules increased in size after starting cefepime infusion.  . Vancomycin Rash    09/04/12 pm Patient started to break out with small macules after IV Vanco infusion, then macules increased in size after starting cefepime infusion.    Outpatient Medications Prior to Visit  Medication Sig Dispense Refill  . amLODipine (NORVASC) 10 MG tablet Take 1 tablet (10 mg total) by mouth daily. 90 tablet 1  . aspirin EC 81 MG tablet Take 1 tablet (81 mg total) by mouth daily.    Marland Kitchen atorvastatin (LIPITOR) 20 MG tablet Take 1 tablet (20 mg total) by mouth daily. 90 tablet 1  . Blood Glucose Monitoring Suppl (TRUE METRIX METER) DEVI 1 kit by Does not apply route 3 (three) times daily. CHECK  BLOOD SUGAR UP TO 3 TIMES DAILY. E11.9 1 each 0  . cetirizine (ZYRTEC) 10 MG tablet Take 1 tablet (10 mg total) by mouth daily. 30 tablet 1  . cloNIDine (CATAPRES) 0.1 MG tablet TAKE 1 TABLET (0.1 MG TOTAL) BY MOUTH AT BEDTIME AS NEEDED. FOR HOT FLASHES 30 tablet 1  . diclofenac (VOLTAREN) 75 MG EC tablet TAKE 1 TABLET (75 MG TOTAL) BY MOUTH 2 (TWO) TIMES DAILY. 60 tablet 1  . glucose blood (TRUE METRIX BLOOD GLUCOSE TEST) test strip Use as instructed 100 each 12  . levETIRAcetam (KEPPRA) 500 MG tablet Take 1 tablet (500 mg total) by mouth 2 (two) times daily. 60 tablet 11  . lidocaine (LIDODERM) 5 % Place 1 patch onto the skin daily. Remove & Discard patch within 12 hours or as directed by MD 30 patch 0  . magnesium oxide (MAG-OX) 400 MG tablet TAKE 1 TABLET (400 MG TOTAL) BY MOUTH DAILY. 30 tablet 3  . metFORMIN (GLUCOPHAGE) 500 MG tablet Take 0.5 tablets (250 mg total) by mouth daily with breakfast. 45 tablet 6  . pantoprazole (PROTONIX) 20 MG tablet TAKE 1 TABLET BY MOUTH 2 TIMES DAILY BEFORE A MEAL 60 tablet 2  . tiZANidine (ZANAFLEX) 4 MG tablet Take 1 tablet (4 mg total) by mouth every 8 (eight) hours as needed for muscle spasms. 60 tablet 1  .  TRUEplus Lancets 28G MISC SMARTSIG:Topical 1 to 3 Times Daily    . allopurinol (ZYLOPRIM) 300 MG tablet Take 1 tablet (300 mg total) by mouth daily. 30 tablet 6  . metroNIDAZOLE (METROGEL VAGINAL) 0.75 % vaginal gel Place 1 Applicatorful vaginally at bedtime. (Patient not taking: Reported on 12/04/2019) 70 g 0   No facility-administered medications prior to visit.     ROS Review of Systems  Constitutional: Negative for activity change, appetite change and fatigue.  HENT: Negative for congestion, sinus pressure and sore throat.   Eyes: Negative for visual disturbance.  Respiratory: Negative for cough, chest tightness, shortness of breath and wheezing.   Cardiovascular: Negative for chest pain and palpitations.  Gastrointestinal: Negative for  abdominal distention, abdominal pain and constipation.  Endocrine: Negative for polydipsia.  Genitourinary: Negative for dysuria and frequency.  Musculoskeletal:       See HPI  Skin: Negative for rash.  Neurological: Negative for tremors, light-headedness and numbness.  Hematological: Does not bruise/bleed easily.  Psychiatric/Behavioral: Negative for agitation and behavioral problems.    Objective:  BP 103/67   Pulse 90   Ht $R'5\' 2"'AL$  (1.575 m)   Wt 150 lb (68 kg)   LMP 12/17/2014   SpO2 98%   BMI 27.44 kg/m   BP/Weight 12/04/2019 8/75/6433 04/02/5186  Systolic BP 416 606 301  Diastolic BP 67 69 74  Wt. (Lbs) 150 150.8 155.8  BMI 27.44 27.58 28.5      Physical Exam Constitutional:      Appearance: She is well-developed.  Neck:     Vascular: No JVD.  Cardiovascular:     Rate and Rhythm: Normal rate.     Heart sounds: Normal heart sounds. No murmur heard.   Pulmonary:     Effort: Pulmonary effort is normal.     Breath sounds: Normal breath sounds. No wheezing or rales.  Chest:     Chest wall: No tenderness.  Abdominal:     General: Bowel sounds are normal. There is no distension.     Palpations: Abdomen is soft. There is no mass.     Tenderness: There is no abdominal tenderness.  Musculoskeletal:        General: Swelling (R knee) and tenderness present. Normal range of motion.     Right lower leg: No edema.     Left lower leg: No edema.  Neurological:     Mental Status: She is alert and oriented to person, place, and time.     Gait: Gait abnormal.  Psychiatric:        Mood and Affect: Mood normal.     CMP Latest Ref Rng & Units 04/24/2019 10/04/2018 05/01/2018  Glucose 65 - 99 mg/dL 98 86 158(H)  BUN 6 - 24 mg/dL $Remove'10 13 13  'uVcWYfN$ Creatinine 0.57 - 1.00 mg/dL 0.92 0.95 0.93  Sodium 134 - 144 mmol/L 143 142 145(H)  Potassium 3.5 - 5.2 mmol/L 4.2 4.2 3.9  Chloride 96 - 106 mmol/L 102 103 104  CO2 20 - 29 mmol/L 23 21 17(L)  Calcium 8.7 - 10.2 mg/dL 9.4 8.3(L) 9.0  Total  Protein 6.5 - 8.1 g/dL - - -  Total Bilirubin 0.3 - 1.2 mg/dL - - -  Alkaline Phos 38 - 126 U/L - - -  AST 15 - 41 U/L - - -  ALT 0 - 44 U/L - - -    Lipid Panel     Component Value Date/Time   CHOL 161 06/22/2017 1041   TRIG 253 (  H) 06/22/2017 1041   HDL 44 06/22/2017 1041   CHOLHDL 3.7 06/22/2017 1041   CHOLHDL 2.4 10/26/2012 1052   VLDL 46 (H) 10/26/2012 1052   LDLCALC 66 06/22/2017 1041   LDLDIRECT 153 (H) 01/30/2008 2146    CBC    Component Value Date/Time   WBC 7.4 04/24/2019 1459   WBC 8.7 11/10/2017 1144   RBC 4.13 04/24/2019 1459   RBC 4.36 11/10/2017 1144   HGB 12.9 04/24/2019 1459   HCT 38.5 04/24/2019 1459   PLT 264 04/24/2019 1459   MCV 93 04/24/2019 1459   MCH 31.2 04/24/2019 1459   MCH 29.4 11/10/2017 1144   MCHC 33.5 04/24/2019 1459   MCHC 32.3 11/10/2017 1144   RDW 14.8 04/24/2019 1459   LYMPHSABS 2.1 04/24/2019 1459   MONOABS 0.5 11/10/2017 1144   EOSABS 0.1 04/24/2019 1459   BASOSABS 0.0 04/24/2019 1459    Lab Results  Component Value Date   HGBA1C 5.1 11/13/2019    Assessment & Plan:  1. Idiopathic chronic gout of right knee without tophus Secondary to ingestion of high purine foods and running out of Allopurinol. Educated on importance of complying with a low purine eating plan. - allopurinol (ZYLOPRIM) 300 MG tablet; Take 1 tablet (300 mg total) by mouth daily.  Dispense: 30 tablet; Refill: 6 - Uric Acid - predniSONE (DELTASONE) 20 MG tablet; Take 1 tablet (20 mg total) by mouth 2 (two) times daily with a meal.  Dispense: 10 tablet; Refill: 0    Meds ordered this encounter  Medications  . allopurinol (ZYLOPRIM) 300 MG tablet    Sig: Take 1 tablet (300 mg total) by mouth daily.    Dispense:  30 tablet    Refill:  6  . predniSONE (DELTASONE) 20 MG tablet    Sig: Take 1 tablet (20 mg total) by mouth 2 (two) times daily with a meal.    Dispense:  10 tablet    Refill:  0    Follow-up: No follow-ups on file.       Charlott Rakes, MD, FAAFP. [facility] [place], [place]   12/04/2019, 8:47 PM

## 2019-12-04 NOTE — Patient Instructions (Signed)

## 2019-12-05 ENCOUNTER — Other Ambulatory Visit: Payer: Self-pay | Admitting: Family Medicine

## 2019-12-05 LAB — URIC ACID: Uric Acid: 9.5 mg/dL — ABNORMAL HIGH (ref 3.0–7.2)

## 2019-12-05 MED ORDER — COLCHICINE 0.6 MG PO TABS: ORAL_TABLET | ORAL | 1 refills | Status: DC

## 2019-12-05 MED FILL — COLCHICINE 0.6 MG TABS: 0.6 | 15 days supply | Qty: 30 | Fill #0

## 2019-12-07 ENCOUNTER — Telehealth: Payer: Self-pay

## 2019-12-07 NOTE — Telephone Encounter (Signed)
-----   Message from Hoy Register, MD sent at 12/05/2019  2:02 PM EDT ----- Labs are in keeping with acute gout flare.  I have sent a prescription for colchicine to the pharmacy to use only when she has gout flares.

## 2019-12-07 NOTE — Telephone Encounter (Signed)
Patient name and DOB has been verified Patient was informed of lab results. Patient had no questions.  

## 2019-12-10 ENCOUNTER — Ambulatory Visit: Payer: No Typology Code available for payment source | Admitting: Family Medicine

## 2019-12-24 ENCOUNTER — Other Ambulatory Visit: Payer: Self-pay | Admitting: Family Medicine

## 2019-12-24 DIAGNOSIS — K219 Gastro-esophageal reflux disease without esophagitis: Secondary | ICD-10-CM

## 2020-01-01 ENCOUNTER — Encounter: Payer: Self-pay | Admitting: Family Medicine

## 2020-01-01 ENCOUNTER — Other Ambulatory Visit: Payer: Self-pay

## 2020-01-01 ENCOUNTER — Other Ambulatory Visit: Payer: Self-pay | Admitting: Family Medicine

## 2020-01-01 ENCOUNTER — Ambulatory Visit: Payer: Self-pay | Attending: Family Medicine | Admitting: Family Medicine

## 2020-01-01 VITALS — BP 102/68 | HR 97 | Ht 62.0 in | Wt 152.0 lb

## 2020-01-01 DIAGNOSIS — M1711 Unilateral primary osteoarthritis, right knee: Secondary | ICD-10-CM

## 2020-01-01 DIAGNOSIS — M1A0610 Idiopathic chronic gout, right knee, without tophus (tophi): Secondary | ICD-10-CM

## 2020-01-01 DIAGNOSIS — E119 Type 2 diabetes mellitus without complications: Secondary | ICD-10-CM

## 2020-01-01 DIAGNOSIS — Z1159 Encounter for screening for other viral diseases: Secondary | ICD-10-CM

## 2020-01-01 DIAGNOSIS — K047 Periapical abscess without sinus: Secondary | ICD-10-CM

## 2020-01-01 MED ORDER — CLINDAMYCIN HCL 300 MG PO CAPS: 300.0000 mg | ORAL_CAPSULE | Freq: Three times a day (TID) | ORAL | 0 refills | Status: DC

## 2020-01-01 MED ORDER — COLCHICINE 0.6 MG PO TABS: ORAL_TABLET | ORAL | 1 refills | Status: DC

## 2020-01-01 MED ORDER — ALLOPURINOL 300 MG PO TABS: 300.0000 mg | ORAL_TABLET | Freq: Two times a day (BID) | ORAL | 6 refills | Status: DC

## 2020-01-01 MED ORDER — PREDNISONE 20 MG PO TABS: 20.0000 mg | ORAL_TABLET | Freq: Two times a day (BID) | ORAL | 0 refills | Status: DC

## 2020-01-01 MED FILL — CLINDAMYCIN HCL 300 MG CAPS: 300 | 6 days supply | Qty: 20 | Fill #0

## 2020-01-01 MED FILL — ?COLCHINCINE 0.6 MG TABS: 0.6 | 10 days supply | Qty: 30 | Fill #0

## 2020-01-01 MED FILL — ?PREDNISONE 20MG TABLET: 20 | 5 days supply | Qty: 10 | Fill #0

## 2020-01-01 MED FILL — ?ALLOPURINOL 300 MG TABLET: 300 | 30 days supply | Qty: 60 | Fill #0

## 2020-01-01 NOTE — Progress Notes (Signed)
Having pain in right knee. 

## 2020-01-01 NOTE — Patient Instructions (Signed)

## 2020-01-01 NOTE — Progress Notes (Signed)
Subjective:  Patient ID: Christina Byrd, female    DOB: [birth year]  Age: 54 y.o. MRN: [id number]  CC: Knee Pain   HPI Christina Byrd  is a54 year old female with a history of type 2 diabetes mellitus (A1c 5.1), GERD, Seizureshere foran acute visit. She complains of pain in her right knee.  She had a visit for this 3 weeks ago at which time uric acid level was elevated at 9.5.  Colchicine was started and allopurinol as well and she was placed in the 5-day course of prednisone which she completed.  She did notice some improvement but then symptoms return.  Declines ingestion of beef or seafood but does drink alcohol and informs me she does not drink as much as she used to. She has had a fall since her last visit due to her right knee pain. Walking makes her knee painful but it is relieved at rest. Right knee x-ray from 08/2018 revealed:  IMPRESSION: 1. Moderate to marked medial compartment and mild patellofemoral compartment osteoarthritis. 2. No acute findings.  She has pain in her teeth and has a swelling in the lower left jaw; unable to see a dentist due to lack of medical coverage.  I had previously referred her in 05/2019 however referral was closed as her orange card expired.  Past Medical History:  Diagnosis Date  . GASTROESOPHAGEAL REFLUX, NO ESOPHAGITIS 04/21/2006   Qualifier: Diagnosis of  By: Benna Dunks    . GERD (gastroesophageal reflux disease) Dx 1995  . HTN (hypertension)     No past surgical history on file.  Family History  Problem Relation Age of Onset  . CAD Mother     Allergies  Allergen Reactions  . Losartan Potassium     Rash  . Levaquin [Levofloxacin In D5w] Rash  . Ace Inhibitors Cough    REACTION: cough  . Codeine Nausea Only  . Cefepime Rash    09/04/12 pm Patient started to break out with small macules after IV Vanco infusion, then macules increased in size after starting cefepime infusion.  . Vancomycin Rash    09/04/12 pm Patient  started to break out with small macules after IV Vanco infusion, then macules increased in size after starting cefepime infusion.    Outpatient Medications Prior to Visit  Medication Sig Dispense Refill  . amLODipine (NORVASC) 10 MG tablet Take 1 tablet (10 mg total) by mouth daily. 90 tablet 1  . aspirin EC 81 MG tablet Take 1 tablet (81 mg total) by mouth daily.    Marland Kitchen atorvastatin (LIPITOR) 20 MG tablet Take 1 tablet (20 mg total) by mouth daily. 90 tablet 1  . Blood Glucose Monitoring Suppl (TRUE METRIX METER) DEVI 1 kit by Does not apply route 3 (three) times daily. CHECK BLOOD SUGAR UP TO 3 TIMES DAILY. E11.9 1 each 0  . cetirizine (ZYRTEC) 10 MG tablet Take 1 tablet (10 mg total) by mouth daily. 30 tablet 1  . cloNIDine (CATAPRES) 0.1 MG tablet TAKE 1 TABLET (0.1 MG TOTAL) BY MOUTH AT BEDTIME AS NEEDED. FOR HOT FLASHES 30 tablet 1  . diclofenac (VOLTAREN) 75 MG EC tablet TAKE 1 TABLET (75 MG TOTAL) BY MOUTH 2 (TWO) TIMES DAILY. 60 tablet 1  . glucose blood (TRUE METRIX BLOOD GLUCOSE TEST) test strip Use as instructed 100 each 12  . levETIRAcetam (KEPPRA) 500 MG tablet Take 1 tablet (500 mg total) by mouth 2 (two) times daily. 60 tablet 11  . lidocaine (LIDODERM) 5 % Place  1 patch onto the skin daily. Remove & Discard patch within 12 hours or as directed by MD 30 patch 0  . magnesium oxide (MAG-OX) 400 MG tablet TAKE 1 TABLET (400 MG TOTAL) BY MOUTH DAILY. 30 tablet 3  . metFORMIN (GLUCOPHAGE) 500 MG tablet Take 0.5 tablets (250 mg total) by mouth daily with breakfast. 45 tablet 6  . pantoprazole (PROTONIX) 20 MG tablet TAKE 1 TABLET BY MOUTH 2 TIMES DAILY BEFORE A MEAL 60 tablet 2  . tiZANidine (ZANAFLEX) 4 MG tablet Take 1 tablet (4 mg total) by mouth every 8 (eight) hours as needed for muscle spasms. 60 tablet 1  . TRUEplus Lancets 28G MISC SMARTSIG:Topical 1 to 3 Times Daily    . allopurinol (ZYLOPRIM) 300 MG tablet Take 1 tablet (300 mg total) by mouth daily. 30 tablet 6  . colchicine  0.6 MG tablet Take 2 tabs (1.2 mg) at the onset of a gout flare, may take 1 tab (0.6 mg) 1 hour later if symptoms persist 30 tablet 1  . predniSONE (DELTASONE) 20 MG tablet Take 1 tablet (20 mg total) by mouth 2 (two) times daily with a meal. 10 tablet 0  . metroNIDAZOLE (METROGEL VAGINAL) 0.75 % vaginal gel Place 1 Applicatorful vaginally at bedtime. (Patient not taking: Reported on 12/04/2019) 70 g 0   No facility-administered medications prior to visit.     ROS Review of Systems  Constitutional: Negative for activity change, appetite change and fatigue.  HENT: Negative for congestion, sinus pressure and sore throat.   Eyes: Negative for visual disturbance.  Respiratory: Negative for cough, chest tightness, shortness of breath and wheezing.   Cardiovascular: Negative for chest pain and palpitations.  Gastrointestinal: Negative for abdominal distention, abdominal pain and constipation.  Endocrine: Negative for polydipsia.  Genitourinary: Negative for dysuria and frequency.  Musculoskeletal:       See HPI  Skin: Negative for rash.  Neurological: Negative for tremors, light-headedness and numbness.  Hematological: Does not bruise/bleed easily.  Psychiatric/Behavioral: Negative for agitation and behavioral problems.    Objective:  BP 102/68   Pulse 97   Ht _0  (1.575 m)   Wt 152 lb (68.9 kg)   LMP 12/17/2014   SpO2 98%   BMI 27.80 kg/m   BP/Weight 01/01/2020 12/04/2019 2/77/8242  Systolic BP 353 614 431  Diastolic BP 68 67 69  Wt. (Lbs) 152 150 150.8  BMI 27.8 27.44 27.58      Physical Exam Constitutional:      Appearance: She is well-developed.  Neck:     Vascular: No JVD.  Cardiovascular:     Rate and Rhythm: Normal rate.     Heart sounds: Normal heart sounds. No murmur heard.   Pulmonary:     Effort: Pulmonary effort is normal.     Breath sounds: Normal breath sounds. No wheezing or rales.  Chest:     Chest wall: No tenderness.  Abdominal:     General:  Bowel sounds are normal. There is no distension.     Palpations: Abdomen is soft. There is no mass.     Tenderness: There is no abdominal tenderness.  Musculoskeletal:     Right lower leg: No edema.     Left lower leg: No edema.     Comments: Edema of right knee with associated tenderness on the lateral aspect of right knee.  Severe tenderness on range of motion Left knee is normal  Neurological:     Mental Status: She is alert and  oriented to person, place, and time.  Psychiatric:        Mood and Affect: Mood normal.     CMP Latest Ref Rng & Units 04/24/2019 10/04/2018 05/01/2018  Glucose 65 - 99 mg/dL 98 86 158(H)  BUN 6 - 24 mg/dL _0 Creatinine 0.57 - 1.00 mg/dL 0.92 0.95 0.93  Sodium 134 - 144 mmol/L 143 142 145(H)  Potassium 3.5 - 5.2 mmol/L 4.2 4.2 3.9  Chloride 96 - 106 mmol/L 102 103 104  CO2 20 - 29 mmol/L 23 21 17(L)  Calcium 8.7 - 10.2 mg/dL 9.4 8.3(L) 9.0  Total Protein 6.5 - 8.1 g/dL - - -  Total Bilirubin 0.3 - 1.2 mg/dL - - -  Alkaline Phos 38 - 126 U/L - - -  AST 15 - 41 U/L - - -  ALT 0 - 44 U/L - - -    Lipid Panel     Component Value Date/Time   CHOL 161 06/22/2017 1041   TRIG 253 (H) 06/22/2017 1041   HDL 44 06/22/2017 1041   CHOLHDL 3.7 06/22/2017 1041   CHOLHDL 2.4 10/26/2012 1052   VLDL 46 (H) 10/26/2012 1052   LDLCALC 66 06/22/2017 1041   LDLDIRECT 153 (H) 01/30/2008 2146    CBC    Component Value Date/Time   WBC 7.4 04/24/2019 1459   WBC 8.7 11/10/2017 1144   RBC 4.13 04/24/2019 1459   RBC 4.36 11/10/2017 1144   HGB 12.9 04/24/2019 1459   HCT 38.5 04/24/2019 1459   PLT 264 04/24/2019 1459   MCV 93 04/24/2019 1459   MCH 31.2 04/24/2019 1459   MCH 29.4 11/10/2017 1144   MCHC 33.5 04/24/2019 1459   MCHC 32.3 11/10/2017 1144   RDW 14.8 04/24/2019 1459   LYMPHSABS 2.1 04/24/2019 1459   MONOABS 0.5 11/10/2017 1144   EOSABS 0.1 04/24/2019 1459   BASOSABS 0.0 04/24/2019 1459    Lab Results  Component Value Date   HGBA1C 5.1  11/13/2019    Assessment & Plan:  1. Idiopathic chronic gout of right knee without tophus Uncontrolled with recurrent flares Advised on the need to quit alcohol consumption Increased allopurinol dose She does have underlying osteoarthritis hence I will refer her to orthopedics - allopurinol (ZYLOPRIM) 300 MG tablet; Take 1 tablet (300 mg total) by mouth 2 (two) times daily.  Dispense: 60 tablet; Refill: 6 - colchicine 0.6 MG tablet; Take 2 tabs (1.2 mg) at the onset of a gout flare, may take 1 tab (0.6 mg) 1 hour later if symptoms persist  Dispense: 30 tablet; Refill: 1 - predniSONE (DELTASONE) 20 MG tablet; Take 1 tablet (20 mg total) by mouth 2 (two) times daily with a meal.  Dispense: 10 tablet; Refill: 0 - DG Knee Complete 4 Views Right; Future - AMB referral to orthopedics - Uric Acid; Future  2. Type 2 diabetes mellitus without complication, without long-term current use of insulin ([place]) Controlled Keep appointment for chronic disease management - CMP14+EGFR; Future - Lipid panel; Future  3. Need for hepatitis C screening test - HCV RNA quant rflx ultra or genotyp([facility]); Future  4. Tooth abscess Unfortunately lack of medical coverage limits her Dental options We will place referral to [facility] Penicillin allergy consult placed on clindamycin - Ambulatory referral to Dentistry - clindamycin (CLEOCIN) 300 MG capsule; Take 1 capsule (300 mg total) by mouth 3 (three) times daily.  Dispense: 20 capsule; Refill: 0  5. Osteoarthritis of right knee, unspecified osteoarthritis type  She might benefit from cortisone injection - AMB referral to orthopedics   Meds ordered this encounter  Medications  . allopurinol (ZYLOPRIM) 300 MG tablet    Sig: Take 1 tablet (300 mg total) by mouth 2 (two) times daily.    Dispense:  60 tablet    Refill:  6    Dose increase  . colchicine 0.6 MG tablet    Sig: Take 2 tabs (1.2 mg) at the onset of a gout flare, may take 1  tab (0.6 mg) 1 hour later if symptoms persist    Dispense:  30 tablet    Refill:  1  . predniSONE (DELTASONE) 20 MG tablet    Sig: Take 1 tablet (20 mg total) by mouth 2 (two) times daily with a meal.    Dispense:  10 tablet    Refill:  0  . clindamycin (CLEOCIN) 300 MG capsule    Sig: Take 1 capsule (300 mg total) by mouth 3 (three) times daily.    Dispense:  20 capsule    Refill:  0    Follow-up: Return in about 3 months (around 04/02/2020) for Chronic disease management.       Charlott Rakes, MD, FAAFP. [facility] [place], [place]   01/01/2020, 2:46 PM

## 2020-01-02 ENCOUNTER — Ambulatory Visit: Payer: Self-pay | Attending: Family Medicine

## 2020-01-02 ENCOUNTER — Telehealth: Payer: Self-pay | Admitting: Family Medicine

## 2020-01-02 DIAGNOSIS — E119 Type 2 diabetes mellitus without complications: Secondary | ICD-10-CM

## 2020-01-02 DIAGNOSIS — Z1159 Encounter for screening for other viral diseases: Secondary | ICD-10-CM

## 2020-01-02 DIAGNOSIS — M1A0610 Idiopathic chronic gout, right knee, without tophus (tophi): Secondary | ICD-10-CM

## 2020-01-02 MED FILL — TRUEplus LANCETS 28G MISC: 30 days supply | Qty: 100 | Fill #6

## 2020-01-02 MED FILL — MAGNESIUM OXIDE 400 MG TAB: 400 | 30 days supply | Qty: 30 | Fill #1

## 2020-01-02 MED FILL — levETIRAcetam 500 MG TABS: 500 | 30 days supply | Qty: 60 | Fill #6

## 2020-01-02 MED FILL — AMLODIPINE BESYLATE 10 MG T: 10 | 30 days supply | Qty: 30 | Fill #0

## 2020-01-02 MED FILL — ?ATORVASTATIN 20 MG TABLET: 20 | 30 days supply | Qty: 30 | Fill #5

## 2020-01-02 MED FILL — METFORMIN HCL 500 MG TABS: 500 | 30 days supply | Qty: 15 | Fill #4

## 2020-01-02 MED FILL — TRUE METRIX TEST STRIP: 25 days supply | Qty: 100 | Fill #6

## 2020-01-02 NOTE — Telephone Encounter (Signed)
Patient presented in clinic today and wanted to let Dr. Alvis Lemmings know that she would not be going to the hospital for her x-rays, patient stated doctor taking care of her knee called this morning and told her that when they do her knee they can complete the x-rays then. Patient just wanted you to be aware, Thank you.

## 2020-01-04 ENCOUNTER — Other Ambulatory Visit: Payer: Self-pay

## 2020-01-04 ENCOUNTER — Telehealth: Payer: Self-pay

## 2020-01-04 ENCOUNTER — Encounter: Payer: Self-pay | Admitting: Physical Medicine and Rehabilitation

## 2020-01-04 ENCOUNTER — Ambulatory Visit (INDEPENDENT_AMBULATORY_CARE_PROVIDER_SITE_OTHER): Payer: No Typology Code available for payment source | Admitting: Physical Medicine and Rehabilitation

## 2020-01-04 DIAGNOSIS — R202 Paresthesia of skin: Secondary | ICD-10-CM

## 2020-01-04 LAB — CMP14+EGFR
ALT: 10 IU/L (ref 0–32)
AST: 14 IU/L (ref 0–40)
Albumin/Globulin Ratio: 1.2 (ref 1.2–2.2)
Albumin: 3.8 g/dL (ref 3.8–4.9)
Alkaline Phosphatase: 126 IU/L — ABNORMAL HIGH (ref 44–121)
BUN/Creatinine Ratio: 15 (ref 9–23)
BUN: 17 mg/dL (ref 6–24)
Bilirubin Total: 0.4 mg/dL (ref 0.0–1.2)
CO2: 22 mmol/L (ref 20–29)
Calcium: 9.6 mg/dL (ref 8.7–10.2)
Chloride: 104 mmol/L (ref 96–106)
Creatinine, Ser: 1.17 mg/dL — ABNORMAL HIGH (ref 0.57–1.00)
GFR calc Af Amer: 61 mL/min/{1.73_m2} (ref >59)
GFR calc non Af Amer: 53 mL/min/{1.73_m2} — ABNORMAL LOW (ref >59)
Globulin, Total: 3.3 g/dL (ref 1.5–4.5)
Glucose: 83 mg/dL (ref 65–99)
Potassium: 4.8 mmol/L (ref 3.5–5.2)
Sodium: 144 mmol/L (ref 134–144)
Total Protein: 7.1 g/dL (ref 6.0–8.5)

## 2020-01-04 LAB — HCV RNA QUANT RFLX ULTRA OR GENOTYP: HCV Quant Baseline: NOT DETECTED IU/mL

## 2020-01-04 LAB — LIPID PANEL
Chol/HDL Ratio: 2.3 ratio (ref 0.0–4.4)
Cholesterol, Total: 151 mg/dL (ref 100–199)
HDL: 66 mg/dL (ref >39)
LDL Chol Calc (NIH): 65 mg/dL (ref 0–99)
Triglycerides: 115 mg/dL (ref 0–149)
VLDL Cholesterol Cal: 20 mg/dL (ref 5–40)

## 2020-01-04 LAB — URIC ACID: Uric Acid: 6.2 mg/dL (ref 3.0–7.2)

## 2020-01-04 NOTE — Telephone Encounter (Signed)
Patient name and DOB has been verified Patient was informed of lab results. Patient had no questions.  

## 2020-01-04 NOTE — Telephone Encounter (Signed)
-----   Message from Hoy Register, MD sent at 01/04/2020  9:25 AM EST ----- Cholesterol is normal, uric acid level is normal.  Other labs are stable

## 2020-01-04 NOTE — Progress Notes (Signed)
Difficulty holding on to things. Shaking in right hand. Symptoms in both hands, but right is worse. Numbness in fingers on left. Right hand dominant No lotion per patient Numeric Pain Rating Scale and Functional Assessment Average Pain 8   In the last MONTH (on 0-10 scale) has pain interfered with the following?  1. General activity like being  able to carry out your everyday physical activities such as walking, climbing stairs, carrying groceries, or moving a chair?  Rating(9)

## 2020-01-07 ENCOUNTER — Ambulatory Visit (INDEPENDENT_AMBULATORY_CARE_PROVIDER_SITE_OTHER): Payer: Self-pay | Admitting: Physician Assistant

## 2020-01-07 ENCOUNTER — Telehealth: Payer: Self-pay | Admitting: Family Medicine

## 2020-01-07 ENCOUNTER — Ambulatory Visit (INDEPENDENT_AMBULATORY_CARE_PROVIDER_SITE_OTHER): Payer: Self-pay

## 2020-01-07 ENCOUNTER — Encounter: Payer: Self-pay | Admitting: Physician Assistant

## 2020-01-07 DIAGNOSIS — M1711 Unilateral primary osteoarthritis, right knee: Secondary | ICD-10-CM

## 2020-01-07 NOTE — Procedures (Signed)
EMG & NCV Findings: Evaluation of the left median (across palm) sensory and the right median (across palm) sensory nerves showed no response (Palm) and prolonged distal peak latency (L4.1, R3.8 ms).  All remaining nerves (as indicated in the following tables) were within normal limits.  All left vs. right side differences were within normal limits.    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   There is no significant electrodiagnostic evidence of any other focal nerve entrapment, brachial plexopathy or cervical radiculopathy.   Recommendations: 1.  Follow-up with referring physician. 2.  Continue current management of symptoms. 3.  Continue use of resting splint at night-time and as needed during the day.  ___________________________ Elease Hashimoto Board Certified, American Board of Physical Medicine and Rehabilitation    Nerve Conduction Studies Anti Sensory Summary Table   Stim Site NR Peak (ms) Norm Peak (ms) P-T Amp (V) Norm P-T Amp Site1 Site2 Delta-P (ms) Dist (cm) Vel (m/s) Norm Vel (m/s)  Left Median Acr Palm Anti Sensory (2nd Digit)  Wrist    *4.1 <3.6 45.9 >10 Wrist Palm  0.0    Palm *NR  <2.0          Right Median Acr Palm Anti Sensory (2nd Digit)  30.3C  Wrist    *3.8 <3.6 27.0 >10 Wrist Palm  0.0    Palm *NR  <2.0          Right Radial Anti Sensory (Base 1st Digit)  30.2C  Wrist    2.2 <3.1 31.5  Wrist Base 1st Digit 2.2 0.0    Right Ulnar Anti Sensory (5th Digit)  30.8C  Wrist    3.2 <3.7 18.2 >15.0 Wrist 5th Digit 3.2 14.0 44 >38   Motor Summary Table   Stim Site NR Onset (ms) Norm Onset (ms) O-P Amp (mV) Norm O-P Amp Site1 Site2 Delta-0 (ms) Dist (cm) Vel (m/s) Norm Vel (m/s)  Left Median Motor (Abd Poll Brev)  Wrist    3.9 <4.2 5.4 >5 Elbow Wrist 3.8 21.5 57 >50  Elbow    7.7  5.7         Right Median  Motor (Abd Poll Brev)  30.4C  Wrist    3.7 <4.2 6.3 >5 Elbow Wrist 4.1 22.0 54 >50  Elbow    7.8  5.6         Right Ulnar Motor (Abd Dig Min)  30.7C  Wrist    3.0 <4.2 10.3 >3 B Elbow Wrist 3.7 19.5 53 >53  B Elbow    6.7  6.9  A Elbow B Elbow 1.9 10.0 53 >53  A Elbow    8.6  6.6           Nerve Conduction Studies Anti Sensory Left/Right Comparison   Stim Site L Lat (ms) R Lat (ms) L-R Lat (ms) L Amp (V) R Amp (V) L-R Amp (%) Site1 Site2 L Vel (m/s) R Vel (m/s) L-R Vel (m/s)  Median Acr Palm Anti Sensory (2nd Digit)  Wrist *4.1 *3.8 0.3 45.9 27.0 41.2 Wrist Palm     Palm             Radial Anti Sensory (Base 1st Digit)  30.2C  Wrist  2.2   31.5  Wrist Base 1st Digit     Ulnar Anti Sensory (5th Digit)  30.8C  Wrist  3.2   18.2  Wrist 5th Digit  44    Motor Left/Right Comparison   Stim Site L Lat (ms) R Lat (ms) L-R Lat (ms) L Amp (mV) R Amp (mV) L-R Amp (%) Site1 Site2 L Vel (m/s) R Vel (m/s) L-R Vel (m/s)  Median Motor (Abd Poll Brev)  Wrist 3.9 3.7 0.2 5.4 6.3 14.3 Elbow Wrist 57 54 3  Elbow 7.7 7.8 0.1 5.7 5.6 1.8       Ulnar Motor (Abd Dig Min)  30.7C  Wrist  3.0   10.3  B Elbow Wrist  53   B Elbow  6.7   6.9  A Elbow B Elbow  53   A Elbow  8.6   6.6           Waveforms:

## 2020-01-07 NOTE — Progress Notes (Signed)
Office Visit Note   Patient: Christina Byrd           Date of Birth: Feb 05, 1966           MRN: [id number] Visit Date: 01/07/2020              Requested by: Hoy Register, MD [address] [place],  [place] [address] PCP: Hoy Register, MD   Assessment & Plan: Visit Diagnoses:  1. Primary osteoarthritis of right knee     Plan:  Discussed with her the radiographic findings of her right knee.  Recommend cortisone injection she states the knee does not bother her enough to do that today.  She will continue with her diclofenac and continue with her medications for her gout.  Work on Dance movement psychotherapist.  Follow-up as needed.  In regards to her hands were still awaiting the EMG nerve conduction studies from Dr. Alvester Morin.  Follow-Up Instructions: Return if symptoms worsen or fail to improve.   Orders:  Orders Placed This Encounter  Procedures  . XR Knee 1-2 Views Right   No orders of the defined types were placed in this encounter.     Procedures: No procedures performed   Clinical Data: No additional findings.   Subjective: Chief Complaint  Patient presents with  . Right Knee - Pain    HPI Christina Byrd 54 year old female who we have seen before for her hands were awaiting results from EMG nerve conduction studies to rule out carpal tunnel syndrome.  She comes in today with new complaint of right knee pain despite been ongoing for years but had a recent flare after eating seafood.  She states that she went to seafood festival and had swelling of her knee and increased pain after eating seafood.  Was tried on diclofenac and colchicine by her primary care physician and this is greatly helped.  She still has some pain in the knee but feels that she really needs no treatment for the knee today.  She has had no new injury to the knee. Review of Systems See HPI otherwise negative  Objective: Vital Signs: LMP 12/17/2014   Physical Exam Constitutional:       Appearance: She is not ill-appearing or diaphoretic.  Pulmonary:     Effort: Pulmonary effort is normal.  Neurological:     Mental Status: She is alert and oriented to person, place, and time.  Psychiatric:        Mood and Affect: Mood normal.     Ortho Exam Right knee full extension full flexion no instability valgus varus stressing.  McMurray's negative.  Tenderness along medial lateral joint line.  Patellofemoral crepitus passive range of motion.  No abnormal warmth or erythema.  Specialty Comments:  No specialty comments available.  Imaging: XR Knee 1-2 Views Right  Result Date: 01/07/2020 AP lateral views right knee: Right knee is well located.  Bone-on-bone knee medial compartment.  Moderate to severe patellofemoral changes.  Lateral joint lines well-maintained.  Otherwise no bony abnormalities.    PMFS History: Patient Active Problem List   Diagnosis Date Noted  . Hypomagnesemia 03/14/2018  . Type 2 diabetes mellitus (HCC) 09/13/2016  . Depression 07/08/2016  . Seizure (HCC) 07/08/2016  . Cocaine use   . Hot flash, menopausal 06/22/2016  . Intertrigo 06/22/2016  . Gout of left ankle 10/09/2015  . Left hip pain 09/02/2015  . EtOH dependence (HCC) 09/02/2015  . Colonoscopy refused 09/02/2015  . Hyperuricemia 05/27/2015  . 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 Pain and swelling of left ankle 12/02/2014  . Tendinitis of right hip flexor 07/23/2014  . Allergic rhinitis 05/13/2014  . GERD (gastroesophageal reflux disease) 04/01/2014  . Tendonitis, Achilles, right 04/01/2014  . Osteoarthritis of left knee 12/11/2013  . Ex-smoker 11/09/2008  . OBESITY 08/12/2006  . HYPERTENSION, BENIGN ESSENTIAL 08/12/2006   Past Medical History:  Diagnosis Date  . GASTROESOPHAGEAL REFLUX, NO ESOPHAGITIS 04/21/2006   Qualifier: Diagnosis of  By: Levada Schilling    . GERD (gastroesophageal reflux disease) Dx 1995  . HTN (hypertension)     Family History  Problem  Relation Age of Onset  . CAD Mother     History reviewed. No pertinent surgical history. Social History   Occupational History  . Not on file  Tobacco Use  . Smoking status: Former Smoker    Quit date: 06/25/2012    Years since quitting: 7.5  . Smokeless tobacco: Never Used  Vaping Use  . Vaping Use: Never used  Substance and Sexual Activity  . Alcohol use: Yes    Alcohol/week: 3.0 standard drinks    Types: 3 Standard drinks or equivalent per week    Comment: ADMITS TO DRINKING 3-4 BEERS/DAY  . Drug use: No  . Sexual activity: Not Currently

## 2020-01-07 NOTE — Telephone Encounter (Signed)
Yes she still has Gout because she had an elevated uric acid at the previous visit.  At her most recent visit her uric acid was normal indicating this most recent episode was not a gout flare but rather osteoarthritis flare.  She needs to remain on allopurinol to prevent having another gout flare.

## 2020-01-07 NOTE — Telephone Encounter (Signed)
Patient wants to know if she still should take the medication for gout, she states that the she saw Ortho for her nee and they state that she has arthritis.

## 2020-01-07 NOTE — Progress Notes (Signed)
Christina Byrd - 54 y.o. female MRN [id number]  Date of birth: 08-05-1965  Office Visit Note: Visit Date: 01/04/2020 PCP: Hoy Register, MD Referred by: Hoy Register, MD  Subjective: Chief Complaint  Patient presents with  . Right Hand - Numbness  . Left Hand - Numbness   HPI: Christina Byrd is a 54 y.o. female who comes in today at the request of Rexene Edison, PA-C for electrodiagnostic study of the Bilateral upper extremities.  Patient is Right hand dominant.  She reports many months of chronic worsening bilateral hand pain with numbness and tingling with the right worse than the left.  She reports difficulty holding onto things and she reports a lot of tremor in the right hand more than the left but tremors in both hands.  She reports numbness in the fingers on the left in a nondermatomal fashion.  She reports her pain is an 8 out of 10.  She has had no prior electrodiagnostic studies.  Her history is complicated by history of diabetes but with fairly good hemoglobin A1c as well as gout.  She reports flareup of gout with change medication and she feels like the gout medications are causing her hands to tremor.  She reports her primary care physician does not think that that is the case.  She does have noticeable intention tremor or essential tremor.  No resting tremor noted today.  On exam she did have positive asterixis.  Her case is complicated by history of alcohol dependence and substance use at least on the notes in the chart.   ROS Otherwise per HPI.  Assessment & Plan: Visit Diagnoses:  1. 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   There is no significant electrodiagnostic evidence of any other focal nerve entrapment, brachial plexopathy or cervical radiculopathy.   Recommendations: 1.   Follow-up with referring physician. 2.  Continue current management of symptoms. 3.  Continue use of resting splint at night-time and as needed during the day.  Meds & Orders: No orders of the defined types were placed in this encounter.   Orders Placed This Encounter  Procedures  . NCV with EMG (electromyography)    Follow-up: Return for Rexene Edison, P.A.-C.   Procedures: No procedures performed  EMG & NCV Findings: Evaluation of the left median (across palm) sensory and the right median (across palm) sensory nerves showed no response (Palm) and prolonged distal peak latency (L4.1, R3.8 ms).  All remaining nerves (as indicated in the following tables) were within normal limits.  All left vs. right side differences were within normal limits.    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   There is no significant electrodiagnostic evidence of any other focal nerve entrapment, brachial plexopathy or cervical radiculopathy.   Recommendations: 1.  Follow-up with referring physician. 2.  Continue current management of symptoms. 3.  Continue use of resting splint at night-time and as needed during the day.  ___________________________ Elease Hashimoto Board Certified, American Board of Physical Medicine and Rehabilitation    Nerve Conduction Studies Anti Sensory Summary Table   Stim Site NR Peak (ms) Norm Peak (ms) P-T Amp (V) Norm P-T Amp Site1 Site2 Delta-P (ms) Dist (cm) Vel (m/s) Norm Vel (  m/s)  Left Median Acr Palm Anti Sensory (2nd Digit)  Wrist    *4.1 <3.6 45.9 >10 Wrist Palm  0.0    Palm *NR  <2.0          Right Median Acr Palm Anti Sensory (2nd Digit)  30.3C  Wrist    *3.8 <3.6 27.0 >10 Wrist Palm  0.0    Palm *NR  <2.0          Right Radial Anti Sensory (Base 1st Digit)  30.2C  Wrist    2.2 <3.1 31.5  Wrist Base 1st  Digit 2.2 0.0    Right Ulnar Anti Sensory (5th Digit)  30.8C  Wrist    3.2 <3.7 18.2 >15.0 Wrist 5th Digit 3.2 14.0 44 >38   Motor Summary Table   Stim Site NR Onset (ms) Norm Onset (ms) O-P Amp (mV) Norm O-P Amp Site1 Site2 Delta-0 (ms) Dist (cm) Vel (m/s) Norm Vel (m/s)  Left Median Motor (Abd Poll Brev)  Wrist    3.9 <4.2 5.4 >5 Elbow Wrist 3.8 21.5 57 >50  Elbow    7.7  5.7         Right Median Motor (Abd Poll Brev)  30.4C  Wrist    3.7 <4.2 6.3 >5 Elbow Wrist 4.1 22.0 54 >50  Elbow    7.8  5.6         Right Ulnar Motor (Abd Dig Min)  30.7C  Wrist    3.0 <4.2 10.3 >3 B Elbow Wrist 3.7 19.5 53 >53  B Elbow    6.7  6.9  A Elbow B Elbow 1.9 10.0 53 >53  A Elbow    8.6  6.6           Nerve Conduction Studies Anti Sensory Left/Right Comparison   Stim Site L Lat (ms) R Lat (ms) L-R Lat (ms) L Amp (V) R Amp (V) L-R Amp (%) Site1 Site2 L Vel (m/s) R Vel (m/s) L-R Vel (m/s)  Median Acr Palm Anti Sensory (2nd Digit)  Wrist *4.1 *3.8 0.3 45.9 27.0 41.2 Wrist Palm     Palm             Radial Anti Sensory (Base 1st Digit)  30.2C  Wrist  2.2   31.5  Wrist Base 1st Digit     Ulnar Anti Sensory (5th Digit)  30.8C  Wrist  3.2   18.2  Wrist 5th Digit  44    Motor Left/Right Comparison   Stim Site L Lat (ms) R Lat (ms) L-R Lat (ms) L Amp (mV) R Amp (mV) L-R Amp (%) Site1 Site2 L Vel (m/s) R Vel (m/s) L-R Vel (m/s)  Median Motor (Abd Poll Brev)  Wrist 3.9 3.7 0.2 5.4 6.3 14.3 Elbow Wrist 57 54 3  Elbow 7.7 7.8 0.1 5.7 5.6 1.8       Ulnar Motor (Abd Dig Min)  30.7C  Wrist  3.0   10.3  B Elbow Wrist  53   B Elbow  6.7   6.9  A Elbow B Elbow  53   A Elbow  8.6   6.6           Waveforms:                 Clinical History: No specialty comments available.   She reports that she quit smoking about 7 years ago. She has never used smokeless tobacco.  Recent Labs    04/24/19 1459 11/13/19 1035 12/04/19 1409 01/02/20  1100  HGBA1C 5.3 5.1  --   --   LABURIC  --   --  9.5*  6.2    Objective:  VS:  HT:    WT:   BMI:     BP:   HR: bpm  TEMP: ( )  RESP:  Physical Exam Musculoskeletal:        General: No swelling, tenderness or deformity.     Comments: Inspection reveals no atrophy of the bilateral APB or FDI or hand intrinsics. There is no swelling, color changes, allodynia or dystrophic changes. There is 5 out of 5 strength in the bilateral wrist extension, finger abduction and long finger flexion. There is intact sensation to light touch in all dermatomal and peripheral nerve distributions.  There is a negative Phalen's test bilaterally. There is a negative Hoffmann's test bilaterally.  There is positive asterixis  Skin:    General: Skin is warm and dry.     Findings: No erythema or rash.  Neurological:     General: No focal deficit present.     Mental Status: She is alert and oriented to person, place, and time.     Motor: No weakness or abnormal muscle tone.     Coordination: Coordination normal.  Psychiatric:        Mood and Affect: Mood normal.        Behavior: Behavior normal.     Ortho Exam  Imaging: No results found.  Past Medical/Family/Surgical/Social History: Medications & Allergies reviewed per EMR, new medications updated. Patient Active Problem List   Diagnosis Date Noted  . Hypomagnesemia 03/14/2018  . Type 2 diabetes mellitus (HCC) 09/13/2016  . Depression 07/08/2016  . Seizure (HCC) 07/08/2016  . Cocaine use   . Hot flash, menopausal 06/22/2016  . Intertrigo 06/22/2016  . Gout of left ankle 10/09/2015  . Left hip pain 09/02/2015  . EtOH dependence (HCC) 09/02/2015  . Colonoscopy refused 09/02/2015  . Hyperuricemia 05/27/2015  . 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 Pain and swelling of left ankle 12/02/2014  . Tendinitis of right hip flexor 07/23/2014  . Allergic rhinitis 05/13/2014  . GERD (gastroesophageal reflux disease) 04/01/2014  . Tendonitis, Achilles, right 04/01/2014  . Osteoarthritis of left  knee 12/11/2013  . Ex-smoker 11/09/2008  . OBESITY 08/12/2006  . HYPERTENSION, BENIGN ESSENTIAL 08/12/2006   Past Medical History:  Diagnosis Date  . GASTROESOPHAGEAL REFLUX, NO ESOPHAGITIS 04/21/2006   Qualifier: Diagnosis of  By: Levada Schilling    . GERD (gastroesophageal reflux disease) Dx 1995  . HTN (hypertension)    Family History  Problem Relation Age of Onset  . CAD Mother    History reviewed. No pertinent surgical history. Social History   Occupational History  . Not on file  Tobacco Use  . Smoking status: Former Smoker    Quit date: 06/25/2012    Years since quitting: 7.5  . Smokeless tobacco: Never Used  Vaping Use  . Vaping Use: Never used  Substance and Sexual Activity  . Alcohol use: Yes    Alcohol/week: 3.0 standard drinks    Types: 3 Standard drinks or equivalent per week    Comment: ADMITS TO DRINKING 3-4 BEERS/DAY  . Drug use: No  . Sexual activity: Not Currently

## 2020-01-07 NOTE — Telephone Encounter (Signed)
Copied from CRM [id number]. Topic: General - Other >> Jan 07, 2020  2:22 PM Jaquita Rector A wrote: Reason for CRM: Patient called to ask for Elease Hashimoto to give her a call regarding an appointment that she had with the knee Dr state that there is a medication she need to discontinue and need to give an update from the visit. Please call Ph# [phone]

## 2020-01-08 NOTE — Telephone Encounter (Signed)
Patient was called and informed to continue taking medication for gout

## 2020-01-14 ENCOUNTER — Telehealth: Payer: Self-pay

## 2020-01-14 ENCOUNTER — Other Ambulatory Visit: Payer: Self-pay | Admitting: Pharmacist

## 2020-01-14 DIAGNOSIS — R232 Flushing: Secondary | ICD-10-CM

## 2020-01-14 MED ORDER — FEBUXOSTAT 40 MG PO TABS: 40.0000 mg | ORAL_TABLET | Freq: Every day | ORAL | 1 refills | Status: DC

## 2020-01-14 MED ORDER — CLONIDINE HCL 0.1 MG PO TABS: 0.1000 mg | ORAL_TABLET | Freq: Every evening | ORAL | 1 refills | Status: DC | PRN

## 2020-01-14 MED FILL — FEBUXOSTAT 40 MG TABS: 40 | 30 days supply | Qty: 30 | Fill #0

## 2020-01-14 NOTE — Telephone Encounter (Signed)
  Per Per  [facility]   She was the one I talked to that didn't want to schedule for a copay here just to have to go elsewhere to take that money and use towards her dentures. I gave her urgent tooths number when I spoke to her the last time.  I mean if I need to call her again  I can but we really can't do anything here to help her.  Patient states she did not state she would not pay them. Would like for them to call her back. She states that everything she eats or drinks is tasting like salt and read that it could indicate gum infection.  Please resend the dental referral. Patient states she is unable to reach anyone when she calls.     Feels sluggish on the Allopurinol 300 mg. Feels like she is high. Would like to know if there is anything else she can take for gout. Has picked up Colchicine and Prednisone. Please advise  Patient states she is going to start back taking the Metformin but wants to know if she could take the whole pill. Advised patient to check blood sugars and take medication for DM as directed.

## 2020-01-14 NOTE — Telephone Encounter (Signed)
Patient was called and informed of medication being sent to pharmacy. 

## 2020-01-14 NOTE — Telephone Encounter (Signed)
Copied from CRM [id number]. Topic: General - Other >> Jan 11, 2020  3:56 PM Randol Kern wrote: Reason for CRM: Pt has mouth pain, requesting a call back from [facility] contact: [phone]

## 2020-01-14 NOTE — Telephone Encounter (Signed)
I have sent prescriptions for Uloric to her pharmacy which will take the place of allopurinol.  Can you please communicate with her what she needs to do about her dental referral?  Thank you.Marland Kitchen

## 2020-01-14 NOTE — Telephone Encounter (Signed)
Pt called and states that her gout medication makes her feel sick and she has stopped taking the medication. She would like another medication in place.  Pt states that she is tasting salt in her mouth and wants to know if that is normal.

## 2020-01-15 ENCOUNTER — Telehealth: Payer: Self-pay

## 2020-01-15 DIAGNOSIS — K219 Gastro-esophageal reflux disease without esophagitis: Secondary | ICD-10-CM

## 2020-01-15 MED ORDER — PANTOPRAZOLE SODIUM 40 MG PO TBEC: 40.0000 mg | DELAYED_RELEASE_TABLET | Freq: Two times a day (BID) | ORAL | 3 refills | Status: DC

## 2020-01-15 MED FILL — ?cloNIDine HCL 0.1 MG TABS: 0.1 | 30 days supply | Qty: 30 | Fill #0

## 2020-01-15 NOTE — Telephone Encounter (Signed)
Pt states that she has been having very bad heartburn and indigestion , she states that her protonix is not working.

## 2020-01-15 NOTE — Telephone Encounter (Signed)
Increased dose of omeprazole sent to pharmacy.

## 2020-01-16 MED FILL — PANTOPRAZOLE SOD DR 40 MG T: 40 | 30 days supply | Qty: 60 | Fill #0

## 2020-01-16 NOTE — Telephone Encounter (Signed)
Call placed to pt and informed of medication refill.

## 2020-01-21 ENCOUNTER — Telehealth: Payer: Self-pay | Admitting: Family Medicine

## 2020-01-21 NOTE — Telephone Encounter (Signed)
Copied from CRM [id number]. Topic: General - Other >> Jan 21, 2020 12:07 PM Gwenlyn Fudge wrote: Reason for CRM: Pt called stating that she needs to speak with Helmut Muster. She states that the medication she was prescribed for gout is not helping her at all. Pt is requesting to have a callback today.  Please advise.    [facility] - [place], [place] - [place] [address] [place] [address] Phone: [phone] Fax: [phone] Hours: Not open 24 hours

## 2020-01-25 ENCOUNTER — Telehealth: Payer: Self-pay | Admitting: Family Medicine

## 2020-01-25 NOTE — Telephone Encounter (Signed)
Patient is calling to schedule an appt with Mikle Bosworth to discuss his orange card. Please advise CB- [phone]

## 2020-01-25 NOTE — Telephone Encounter (Signed)
I return Pt call, unable to LVM is not set up, please inform she need the 2020 non-filling letter from the IRS

## 2020-01-25 NOTE — Telephone Encounter (Signed)
I return Pt call, she still did not received the IRS paper

## 2020-01-25 NOTE — Telephone Encounter (Signed)
mosCopied from CRM [id number]. Topic: General - Other >> Jan 25, 2020 11:40 AM [name] E wrote: Reason for CRM: Pt would like Mikle Bosworth to call her back asap about what tax papers she needs while she is at the tax office please advise

## 2020-01-29 ENCOUNTER — Ambulatory Visit: Payer: Medicaid Other | Attending: Physician Assistant | Admitting: Physician Assistant

## 2020-01-29 ENCOUNTER — Other Ambulatory Visit: Payer: Self-pay

## 2020-01-29 VITALS — BP 110/77 | HR 100 | Temp 98.7°F | Ht 62.0 in | Wt 147.0 lb

## 2020-01-29 DIAGNOSIS — K219 Gastro-esophageal reflux disease without esophagitis: Secondary | ICD-10-CM

## 2020-01-29 DIAGNOSIS — M1A0610 Idiopathic chronic gout, right knee, without tophus (tophi): Secondary | ICD-10-CM

## 2020-01-29 DIAGNOSIS — R232 Flushing: Secondary | ICD-10-CM

## 2020-01-29 MED ORDER — OMEPRAZOLE 20 MG PO CPDR: 20.0000 mg | DELAYED_RELEASE_CAPSULE | Freq: Two times a day (BID) | ORAL | 3 refills | Status: DC

## 2020-01-29 NOTE — Progress Notes (Signed)
Established Patient Office Visit  Subjective:  Patient ID: Christina Byrd, female    DOB: 1965/11/03  Age: 54 y.o. MRN: [id number]  CC:  Chief Complaint  Patient presents with  . Knee Pain    HPI LUNDEN STIEBER reports that she had been having acid reflux on an almost daily basis, endorses worsening symptoms based on spicy foods or fried foods.  Reports that she started taking omeprazole 20 mg twice a day instead of the pantoprazole, states that she felt it is more effective.  Reports that she continues to have right knee and leg pain, but does endorse that she only took the Uloric once instead of daily.  Reports that she continues to have hot flushes at night despite taking clonidine 0.1 mg 1 hour before bed.  Reports that she is continuing to drink alcohol, states that she drinks 24 ounces of [place] and 2-3 shots of [employer] light brandy.  Does endorse that she takes the clonidine prior to drinking alcohol.    Past Medical History:  Diagnosis Date  . GASTROESOPHAGEAL REFLUX, NO ESOPHAGITIS 04/21/2006   Qualifier: Diagnosis of  By: Benna Dunks    . GERD (gastroesophageal reflux disease) Dx 1995  . HTN (hypertension)     No past surgical history on file.  Family History  Problem Relation Age of Onset  . CAD Mother     Social History   Socioeconomic History  . Marital status: Single    Spouse name: Not on file  . Number of children: Not on file  . Years of education: Not on file  . Highest education level: Not on file  Occupational History  . Not on file  Tobacco Use  . Smoking status: Former Smoker    Quit date: 06/25/2012    Years since quitting: 7.6  . Smokeless tobacco: Never Used  Vaping Use  . Vaping Use: Never used  Substance and Sexual Activity  . Alcohol use: Yes    Alcohol/week: 3.0 standard drinks    Types: 3 Standard drinks or equivalent per week    Comment: ADMITS TO DRINKING 3-4 BEERS/DAY  . Drug use: No  . Sexual activity: Not  Currently  Other Topics Concern  . Not on file  Social History Narrative   Pt lives in single story home with her partner   Has 1 son   5 grandchildren   10th grade education   Works at [employer]    Social Determinants of Health   Financial Resource Strain:   . Difficulty of Paying Living Expenses: Not on file  Food Insecurity:   . Worried About Charity fundraiser in the Last Year: Not on file  . Ran Out of Food in the Last Year: Not on file  Transportation Needs:   . Lack of Transportation (Medical): Not on file  . Lack of Transportation (Non-Medical): Not on file  Physical Activity:   . Days of Exercise per Week: Not on file  . Minutes of Exercise per Session: Not on file  Stress:   . Feeling of Stress : Not on file  Social Connections:   . Frequency of Communication with Friends and Family: Not on file  . Frequency of Social Gatherings with Friends and Family: Not on file  . Attends Religious Services: Not on file  . Active Member of Clubs or Organizations: Not on file  . Attends Archivist Meetings: Not on file  . Marital Status: Not on file  Intimate Partner Violence:   . Fear of Current or Ex-Partner: Not on file  . Emotionally Abused: Not on file  . Physically Abused: Not on file  . Sexually Abused: Not on file    Outpatient Medications Prior to Visit  Medication Sig Dispense Refill  . amLODipine (NORVASC) 10 MG tablet Take 1 tablet (10 mg total) by mouth daily. 90 tablet 1  . aspirin EC 81 MG tablet Take 1 tablet (81 mg total) by mouth daily.    Marland Kitchen atorvastatin (LIPITOR) 20 MG tablet Take 1 tablet (20 mg total) by mouth daily. 90 tablet 1  . Blood Glucose Monitoring Suppl (TRUE METRIX METER) DEVI 1 kit by Does not apply route 3 (three) times daily. CHECK BLOOD SUGAR UP TO 3 TIMES DAILY. E11.9 1 each 0  . cetirizine (ZYRTEC) 10 MG tablet Take 1 tablet (10 mg total) by mouth daily. 30 tablet 1  . cloNIDine (CATAPRES) 0.1 MG tablet Take 1 tablet (0.1  mg total) by mouth at bedtime as needed. For hot flashes 30 tablet 1  . colchicine 0.6 MG tablet Take 2 tabs (1.2 mg) at the onset of a gout flare, may take 1 tab (0.6 mg) 1 hour later if symptoms persist 30 tablet 1  . diclofenac (VOLTAREN) 75 MG EC tablet TAKE 1 TABLET (75 MG TOTAL) BY MOUTH 2 (TWO) TIMES DAILY. 60 tablet 1  . febuxostat (ULORIC) 40 MG tablet Take 1 tablet (40 mg total) by mouth daily. 30 tablet 1  . glucose blood (TRUE METRIX BLOOD GLUCOSE TEST) test strip Use as instructed 100 each 12  . levETIRAcetam (KEPPRA) 500 MG tablet Take 1 tablet (500 mg total) by mouth 2 (two) times daily. 60 tablet 11  . lidocaine (LIDODERM) 5 % Place 1 patch onto the skin daily. Remove & Discard patch within 12 hours or as directed by MD 30 patch 0  . magnesium oxide (MAG-OX) 400 MG tablet TAKE 1 TABLET (400 MG TOTAL) BY MOUTH DAILY. 30 tablet 3  . metFORMIN (GLUCOPHAGE) 500 MG tablet Take 0.5 tablets (250 mg total) by mouth daily with breakfast. 45 tablet 6  . pantoprazole (PROTONIX) 40 MG tablet Take 1 tablet (40 mg total) by mouth 2 (two) times daily before a meal. 60 tablet 3  . tiZANidine (ZANAFLEX) 4 MG tablet Take 1 tablet (4 mg total) by mouth every 8 (eight) hours as needed for muscle spasms. 60 tablet 1  . TRUEplus Lancets 28G MISC SMARTSIG:Topical 1 to 3 Times Daily    . clindamycin (CLEOCIN) 300 MG capsule Take 1 capsule (300 mg total) by mouth 3 (three) times daily. 20 capsule 0  . metroNIDAZOLE (METROGEL VAGINAL) 0.75 % vaginal gel Place 1 Applicatorful vaginally at bedtime. 70 g 0  . predniSONE (DELTASONE) 20 MG tablet Take 1 tablet (20 mg total) by mouth 2 (two) times daily with a meal. 10 tablet 0   No facility-administered medications prior to visit.    Allergies  Allergen Reactions  . Losartan Potassium     Rash  . Levaquin [Levofloxacin In D5w] Rash  . Ace Inhibitors Cough    REACTION: cough  . Codeine Nausea Only  . Cefepime Rash    09/04/12 pm Patient started to break  out with small macules after IV Vanco infusion, then macules increased in size after starting cefepime infusion.  . Vancomycin Rash    09/04/12 pm Patient started to break out with small macules after IV Vanco infusion, then macules increased in size after  starting cefepime infusion.    ROS Review of Systems  Constitutional: Negative.   HENT: Negative.   Eyes: Negative.   Respiratory: Negative.   Cardiovascular: Negative.   Gastrointestinal: Negative for nausea and vomiting.  Endocrine: Negative.   Genitourinary: Negative.   Musculoskeletal: Positive for gait problem and joint swelling.  Allergic/Immunologic: Negative.   Hematological: Negative.   Psychiatric/Behavioral: Negative.       Objective:    Physical Exam Vitals and nursing note reviewed.  Constitutional:      General: She is not in acute distress.    Appearance: Normal appearance. She is not ill-appearing.  HENT:     Head: Normocephalic and atraumatic.     Right Ear: External ear normal.     Left Ear: External ear normal.     Nose: Nose normal.     Mouth/Throat:     Mouth: Mucous membranes are moist.     Pharynx: Oropharynx is clear.  Eyes:     Extraocular Movements: Extraocular movements intact.     Conjunctiva/sclera: Conjunctivae normal.     Pupils: Pupils are equal, round, and reactive to light.  Cardiovascular:     Rate and Rhythm: Normal rate and regular rhythm.     Pulses: Normal pulses.     Heart sounds: Normal heart sounds.  Pulmonary:     Effort: Pulmonary effort is normal.     Breath sounds: Normal breath sounds.  Abdominal:     General: Abdomen is flat.     Palpations: Abdomen is soft.     Tenderness: There is no abdominal tenderness.  Musculoskeletal:     Cervical back: Normal range of motion and neck supple.     Right knee: Swelling present. Decreased range of motion. Tenderness present.     Left knee: Normal.  Skin:    General: Skin is warm and dry.  Neurological:     General: No  focal deficit present.     Mental Status: She is alert and oriented to person, place, and time.  Psychiatric:        Mood and Affect: Mood normal.        Behavior: Behavior normal.        Thought Content: Thought content normal.        Judgment: Judgment normal.     BP 110/77 (BP Location: Right Arm, Patient Position: Sitting, Cuff Size: Normal)   Pulse 100   Temp 98.7 F (37.1 C) (Oral)   Ht 5\' 2"  (1.575 m)   Wt 147 lb (66.7 kg)   LMP 12/17/2014   BMI 26.89 kg/m  Wt Readings from Last 3 Encounters:  01/29/20 147 lb (66.7 kg)  01/01/20 152 lb (68.9 kg)  12/04/19 150 lb (68 kg)     Health Maintenance Due  Topic Date Due  . Hepatitis C Screening  Never done  . OPHTHALMOLOGY EXAM  Never done  . COVID-19 Vaccine (1) Never done  . MAMMOGRAM  Never done  . FOOT EXAM  05/01/2019    There are no preventive care reminders to display for this patient.  Lab Results  Component Value Date   TSH 1.580 03/22/2018   Lab Results  Component Value Date   WBC 7.4 04/24/2019   HGB 12.9 04/24/2019   HCT 38.5 04/24/2019   MCV 93 04/24/2019   PLT 264 04/24/2019   Lab Results  Component Value Date   NA 144 01/02/2020   K 4.8 01/02/2020   CO2 22 01/02/2020   GLUCOSE 83  01/02/2020   BUN 17 01/02/2020   CREATININE 1.17 (H) 01/02/2020   BILITOT 0.4 01/02/2020   ALKPHOS 126 (H) 01/02/2020   AST 14 01/02/2020   ALT 10 01/02/2020   PROT 7.1 01/02/2020   ALBUMIN 3.8 01/02/2020   CALCIUM 9.6 01/02/2020   ANIONGAP 13 11/10/2017   GFR 76.32 12/18/2012   Lab Results  Component Value Date   CHOL 151 01/02/2020   Lab Results  Component Value Date   HDL 66 01/02/2020   Lab Results  Component Value Date   LDLCALC 65 01/02/2020   Lab Results  Component Value Date   TRIG 115 01/02/2020   Lab Results  Component Value Date   CHOLHDL 2.3 01/02/2020   Lab Results  Component Value Date   HGBA1C 5.1 11/13/2019      Assessment & Plan:   Problem List Items Addressed This  Visit      Digestive   GERD (gastroesophageal reflux disease) - Primary (Chronic)   Relevant Medications   omeprazole (PRILOSEC) 20 MG capsule    Other Visit Diagnoses    Hot flashes       Idiopathic chronic gout of right knee without tophus        1. Gastroesophageal reflux disease without esophagitis Encourage patient to continue omeprazole 20 mg twice a day, patient education given on GERD lifestyle modifications - omeprazole (PRILOSEC) 20 MG capsule; Take 1 capsule (20 mg total) by mouth 2 (two) times daily before a meal.  Dispense: 30 capsule; Refill: 3  2. Hot flashes Patient education given on proper dosing of clonidine, encourage patient to reduce alcohol intake, patient stated during interview that she was drinking 24 ounces of [place] and 2-3 shots of brandy each night, later in the interview she did endorse that she was drinking 4-5 shots of brandy each night.  3. Idiopathic chronic gout of right knee without tophus Continue currently prescribed medication, patient education given on benefits of reduction of alcohol and improvement of gout   Meds ordered this encounter  Medications  . omeprazole (PRILOSEC) 20 MG capsule    Sig: Take 1 capsule (20 mg total) by mouth 2 (two) times daily before a meal.    Dispense:  30 capsule    Refill:  3    Discontinue Protonix    Order Specific Question:   Supervising Provider    Answer:   Elsie Stain [[id number]]    I have reviewed the patient's medical history (PMH, PSH, Social History, Family History, Medications, and allergies) , and have been updated if relevant. I spent 30 minutes reviewing chart and  face to face time with patient.    Follow-up: No follow-ups on file.    Loraine Grip Mayers, PA-C

## 2020-01-29 NOTE — Progress Notes (Signed)
Patient has taken medication today. Patient complains of right leg pain. Patient complains of throat constriction or acid reflux. Patient states the manufacturer changes of her medication is causing a different concern, patient moves her bowels in the am.

## 2020-01-29 NOTE — Patient Instructions (Addendum)
I encourage you to continue working on reducing your alcohol intake even further, I do believe that this will give you relief from a lot of your current symptoms.  Please let us know if there is anything else that we can do for you  Roney Jaffe, PA-C Physician Assistant [facility] [email or web address]    Binge-Drinking Information, Adult Binge-drinking refers to drinking a large amount of alcohol in a short time. This is usually 5 drinks for men or 4 drinks for women, on one occasion. People who binge-drink do not always have an alcohol problem. However, binge-drinking can raise your risk of becoming dependent on alcohol (having alcohol use disorder). In addition to health problems, such as heart disease, liver disease, or cancer, binge-drinking can also lead to legal, financial, and interpersonal problems. Friends and family may notice signs of binge-drinking or alcohol use disorder before you do. What lifestyle changes can be made?      Avoid drinking too much. If you choose to drink, drink slowly, set a limit for yourself, and follow that plan. Aim for a limit of 1 drink a day for nonpregnant women and 2 drinks a day for men. One drink equals 12 oz of beer, 5 oz of wine, or 1 oz of hard liquor.  Encourage others around you not to binge-drink.  Become aware of situations and people who trigger your drinking behavior, and either avoid those or find a new way to deal with them. Find hobbies that you can do instead of drinking, such as exercising or outdoor activities.  Do not drink if: ? You are pregnant or trying to become pregnant. ? You plan to drive a vehicle. ? You plan to use machinery, such as a Surveyor, mining or power tool. ? You have a health condition that alcohol can make worse. ? You take medicines that are affected by alcohol, including prescription pain medicines. ? You are recovering from alcohol use  disorder.  Develop skills to manage your moods and emotions so you do not need to drink to cope with them. This may include using stress reduction techniques such as: ? Deep breathing. ? Meditation or yoga. ? Exercise or playing sports. ? Keeping a stress diary. ? Listening to music. Why are these changes important? Binge-drinking can put you at risk for serious health problems, including:  Accidental injuries, such as alcohol poisoning or falls.  Violence, such as sexual assault.  STDs (sexually transmitted diseases).  Developing alcohol use disorder.  Mental health problems, such as anxiety or depression.  Problems with memory or learning.  Liver disease.  High blood pressure.  Heart disease.  Stroke.  Cancer of the liver, colon, esophagus, breast, or mouth. If you binge-drink while pregnant, your baby may also be at risk for health problems, including:  Miscarriage.  Stillbirth.  Fetal alcohol spectrum disorders (FASDs).  Sudden infant death syndrome (SIDS). What can happen if changes are not made? In addition to health problems, binge-drinking can also raise your risk of:  Car accidents.  Problems with relationships and other social situations.  Legal or financial problems.  Unplanned pregnancies. What are the benefits of controlling my drinking? Controlling your drinking or quitting drinking can make you feel better and:  Help you control your weight.  Make you more likely to get into good physical shape and stay fit.  Improve how your body processes vitamins and minerals.  Improve your health by lowering your blood pressure, cholesterol, triglycerides, and blood sugar (glucose).  Help you think more clearly and make better decisions. Where to find support: You can get support for stopping binge-drinking from:  Your health care provider. He or she may be able to recommend counseling if you drink too much.  The National Drug and Alcohol  Treatment Referral Service: 1-800-662-HELP 386-744-0557)  Alcoholics Anonymous, Alcoholics Resource Center: (803)166-1075 Where to find more information: You can find more information about binge-drinking from:  Substance Abuse and Mental Health Services Administration: SkateOasis.com.pt  Alcoholics Resource Center: www.alcoholicsanonymous.com Contact a health care provider if:  You cannot control your drinking, or you think that your drinking might be out of your control.  You have unexpected physical problems that cause you distress, such as accidental injuries.  You need help with the consequences of your drinking. Get help right away if:  You have thoughts about hurting yourself or others. If you ever feel like you may hurt yourself or others, or have thoughts about taking your own life, get help right away. You can go to your nearest emergency department or call:  Your local emergency services (911 in the [place]  A suicide crisis helpline, such as the National Suicide Prevention Lifeline at 631-492-8011. This is open 24 hours a day. Summary  Binge-drinking refers to drinking a large amount of alcohol in a short time. This is usually 5 drinks for men or 4 drinks for women, on one occasion.  Binge-drinking can lead to serious health problems, such as heart disease, liver disease, or cancer.  Binge-drinking raises your risk of developing alcohol dependence (alcohol use disorder) and social and relationship problems.  Talk with your health care provider about your drinking habits. He or she may be able to recommend counseling if you drink too much. This information is not intended to replace advice given to you by your health care provider. Make sure you discuss any questions you have with your health care provider. Document Revised: 05/31/2018 Document Reviewed: 12/08/2016 Elsevier Patient Education  2020 [employer].

## 2020-01-30 ENCOUNTER — Encounter: Payer: Self-pay | Admitting: Physician Assistant

## 2020-01-30 DIAGNOSIS — R232 Flushing: Secondary | ICD-10-CM | POA: Insufficient documentation

## 2020-01-30 DIAGNOSIS — M1A0610 Idiopathic chronic gout, right knee, without tophus (tophi): Secondary | ICD-10-CM | POA: Insufficient documentation

## 2020-01-30 HISTORY — DX: Flushing: R23.2

## 2020-01-30 MED FILL — levETIRAcetam 500 MG TABS: 500 | 30 days supply | Qty: 60 | Fill #7

## 2020-01-30 MED FILL — DICLOFENAC SOD EC 75 MG TAB: 75 | 30 days supply | Qty: 60 | Fill #1

## 2020-01-30 MED FILL — AMLODIPINE BESYLATE 10 MG T: 10 | 30 days supply | Qty: 30 | Fill #1

## 2020-01-30 MED FILL — ?ATORVASTATIN 20 MG TABLET: 20 | 30 days supply | Qty: 30 | Fill #0

## 2020-01-30 MED FILL — MAGNESIUM OXIDE 400 MG TAB: 400 | 30 days supply | Qty: 30 | Fill #2

## 2020-02-08 MED FILL — ?cloNIDine HCL 0.1 MG TABS: 0.1 | 30 days supply | Qty: 30 | Fill #1

## 2020-02-08 MED FILL — FEBUXOSTAT 40 MG TABS: 40 | 30 days supply | Qty: 30 | Fill #1

## 2020-02-12 ENCOUNTER — Telehealth: Payer: Self-pay | Admitting: Family Medicine

## 2020-02-12 NOTE — Telephone Encounter (Signed)
Scheduled patient with Sharon Seller on Jan 12th. Offered patient to be seen at mobile and patient stated she would like to come in on the 12th.

## 2020-02-12 NOTE — Telephone Encounter (Signed)
Please set patient up with an appointment with any available or direct he to the mobile unit.

## 2020-02-12 NOTE — Telephone Encounter (Signed)
Pt called in about medication questions/ pt also stated that she is still having an issue with her seizure meds and them causing her throat to have a knot after taking/ Pt stated she thought she had an appt for tomorrow / that appt was cancelled and pt was seen on 12.7.21 but pt stated nothing was done about the seizure medication issue/ Pt states it is upsetting for her meds to change without any directions of what to do and why/ please advise asap / Pt states she never gets called back and also has confusion about her sugar medication

## 2020-02-13 ENCOUNTER — Ambulatory Visit: Payer: Self-pay | Admitting: Physician Assistant

## 2020-02-21 ENCOUNTER — Telehealth: Payer: Self-pay | Admitting: Family Medicine

## 2020-02-21 NOTE — Telephone Encounter (Signed)
Copied from CRM [id number]. Topic: General - Other >> Feb 21, 2020  8:59 AM Jaquita Rector A wrote: Reason for CRM: Patient called in to ask Dr Alvis Lemmings to please call her in an Rx to her pharmacy for coughing say that she have been coughing all night long. Ph# [phone]

## 2020-02-29 ENCOUNTER — Telehealth: Payer: Self-pay | Admitting: Family Medicine

## 2020-02-29 NOTE — Telephone Encounter (Signed)
Copied from CRM [id number]. Topic: General - Other >> Feb 28, 2020  2:13 PM Tamela Oddi wrote: Reason for CRM: Patient called to ask Mikle Bosworth to call her.  She did not elaborate on what she needed, but only said she would like a call back.  CB# [phone]

## 2020-02-29 NOTE — Telephone Encounter (Signed)
I return Pt call and schedule a financial appt

## 2020-03-03 NOTE — Telephone Encounter (Signed)
Pt no longer needs cough medication she had her son go to pharm and get her some cough med

## 2020-03-05 ENCOUNTER — Ambulatory Visit: Payer: 59 | Attending: Physician Assistant | Admitting: Physician Assistant

## 2020-03-05 ENCOUNTER — Other Ambulatory Visit: Payer: Self-pay

## 2020-03-05 ENCOUNTER — Ambulatory Visit: Payer: 59

## 2020-03-05 ENCOUNTER — Encounter: Payer: Self-pay | Admitting: Physician Assistant

## 2020-03-05 DIAGNOSIS — K219 Gastro-esophageal reflux disease without esophagitis: Secondary | ICD-10-CM

## 2020-03-05 DIAGNOSIS — I10 Essential (primary) hypertension: Secondary | ICD-10-CM | POA: Diagnosis not present

## 2020-03-05 DIAGNOSIS — R569 Unspecified convulsions: Secondary | ICD-10-CM

## 2020-03-05 DIAGNOSIS — R232 Flushing: Secondary | ICD-10-CM

## 2020-03-05 DIAGNOSIS — H6690 Otitis media, unspecified, unspecified ear: Secondary | ICD-10-CM

## 2020-03-05 DIAGNOSIS — E119 Type 2 diabetes mellitus without complications: Secondary | ICD-10-CM

## 2020-03-05 DIAGNOSIS — R253 Fasciculation: Secondary | ICD-10-CM

## 2020-03-05 DIAGNOSIS — M1A0610 Idiopathic chronic gout, right knee, without tophus (tophi): Secondary | ICD-10-CM

## 2020-03-05 MED ORDER — DICLOFENAC SODIUM 75 MG PO TBEC: 75.0000 mg | DELAYED_RELEASE_TABLET | Freq: Two times a day (BID) | ORAL | 1 refills | Status: DC

## 2020-03-05 MED ORDER — AMLODIPINE BESYLATE 10 MG PO TABS: 10.0000 mg | ORAL_TABLET | Freq: Every day | ORAL | 1 refills | Status: DC

## 2020-03-05 MED ORDER — CLONIDINE HCL 0.1 MG PO TABS: 0.1000 mg | ORAL_TABLET | Freq: Every evening | ORAL | 1 refills | Status: DC | PRN

## 2020-03-05 MED ORDER — COLCHICINE 0.6 MG PO TABS: ORAL_TABLET | ORAL | 1 refills | Status: DC

## 2020-03-05 MED ORDER — ALLOPURINOL 100 MG PO TABS: 100.0000 mg | ORAL_TABLET | Freq: Every day | ORAL | 6 refills | Status: DC

## 2020-03-05 MED ORDER — ATORVASTATIN CALCIUM 20 MG PO TABS: 20.0000 mg | ORAL_TABLET | Freq: Every day | ORAL | 1 refills | Status: DC

## 2020-03-05 MED ORDER — AZITHROMYCIN 250 MG PO TABS: ORAL_TABLET | ORAL | 0 refills | Status: DC

## 2020-03-05 MED ORDER — FLUTICASONE PROPIONATE 50 MCG/ACT NA SUSP: 2.0000 | Freq: Every day | NASAL | 6 refills | Status: DC

## 2020-03-05 MED ORDER — LEVETIRACETAM 500 MG PO TABS: 500.0000 mg | ORAL_TABLET | Freq: Two times a day (BID) | ORAL | 11 refills | Status: DC

## 2020-03-05 MED ORDER — OMEPRAZOLE 20 MG PO CPDR: 20.0000 mg | DELAYED_RELEASE_CAPSULE | Freq: Two times a day (BID) | ORAL | 3 refills | Status: DC

## 2020-03-05 MED ORDER — METFORMIN HCL 500 MG PO TABS: 250.0000 mg | ORAL_TABLET | Freq: Every day | ORAL | 6 refills | Status: DC

## 2020-03-05 MED ORDER — PANTOPRAZOLE SODIUM 40 MG PO TBEC: 40.0000 mg | DELAYED_RELEASE_TABLET | Freq: Two times a day (BID) | ORAL | 3 refills | Status: DC

## 2020-03-05 MED ORDER — TIZANIDINE HCL 4 MG PO TABS: 4.0000 mg | ORAL_TABLET | Freq: Three times a day (TID) | ORAL | 1 refills | Status: DC | PRN

## 2020-03-05 NOTE — Progress Notes (Signed)
Patient ID: Christina Byrd, female   DOB: [birth year], 55 y.o.   MRN: [id number] Virtual Visit via Telephone Note  I connected with Christina Byrd on 03/05/20 at  3:50 PM EST by telephone and verified that I am speaking with the correct person using two identifiers.  Location: Patient:  Christina Byrd Provider:  Georgian Co, PA-C   I discussed the limitations, risks, security and privacy concerns of performing an evaluation and management service by telephone and the availability of in person appointments. I also discussed with the patient that there may be a patient responsible charge related to this service. The patient expressed understanding and agreed to proceed.  PATIENT visit by telephone virtually in the context of Covid-19 pandemic. Patient location:  In the office waiting on meds My Location:  [facility] office Persons on the call:  Me and the patient, screened by Carolynne Edouard   History of Present Illness:  L ear pain, pressure and congestion X 2 weeks.   Also, does not want to take uloric bc she says it "makes her whole body hurt."  She is open to resuming low dose allopurinol.  She is not currently having a flare.    A1C=5.1 10/2019    Observations/Objective: NAD.  A&Ox3 (Since she was in the office to pick up prescriptions), I did look at both ears.  R TM WNL; L TM bulging and erythematous.    Assessment and Plan: 1. HYPERTENSION, BENIGN ESSENTIAL UTD on labs.  Controlled in Nov and Dec visits - amLODipine (NORVASC) 10 MG tablet; Take 1 tablet (10 mg total) by mouth daily.  Dispense: 90 tablet; Refill: 1  2. Controlled type 2 diabetes mellitus without complication, without long-term current use of insulin (HCC) Last A1C=5.1 - atorvastatin (LIPITOR) 20 MG tablet; Take 1 tablet (20 mg total) by mouth daily.  Dispense: 90 tablet; Refill: 1 - metFORMIN (GLUCOPHAGE) 500 MG tablet; Take 0.5 tablets (250 mg total) by mouth daily with breakfast.  Dispense: 45 tablet;  Refill: 6  3. Gastroesophageal reflux disease without esophagitis - omeprazole (PRILOSEC) 20 MG capsule; Take 1 capsule (20 mg total) by mouth 2 (two) times daily before a meal.  Dispense: 30 capsule; Refill: 3 - pantoprazole (PROTONIX) 40 MG tablet; Take 1 tablet (40 mg total) by mouth 2 (two) times daily before a meal.  Dispense: 60 tablet; Refill: 3  4. Hot flashes - cloNIDine (CATAPRES) 0.1 MG tablet; Take 1 tablet (0.1 mg total) by mouth at bedtime as needed. For hot flashes  Dispense: 30 tablet; Refill: 1  5. Idiopathic chronic gout of right knee without tophus Stop uloric - allopurinol (ZYLOPRIM) 100 MG tablet; Take 1 tablet (100 mg total) by mouth daily.  Dispense: 30 tablet; Refill: 6 - colchicine 0.6 MG tablet; Take 2 tabs (1.2 mg) at the onset of a gout flare, may take 1 tab (0.6 mg) 1 hour later if symptoms persist  Dispense: 30 tablet; Refill: 1  6. Fasciculations of muscle - tiZANidine (ZANAFLEX) 4 MG tablet; Take 1 tablet (4 mg total) by mouth every 8 (eight) hours as needed for muscle spasms.  Dispense: 60 tablet; Refill: 1  7. Gastroesophageal reflux disease  8. Seizure (HCC) - levETIRAcetam (KEPPRA) 500 MG tablet; Take 1 tablet (500 mg total) by mouth 2 (two) times daily.  Dispense: 60 tablet; Refill: 11  9. Acute otitis media, unspecified otitis media type - fluticasone (FLONASE) 50 MCG/ACT nasal spray; Place 2 sprays into both nostrils daily.  Dispense: 16 g; Refill:  6 - azithromycin (ZITHROMAX) 250 MG tablet; 2 today then 1 daily  Dispense: 6 tablet; Refill: 0   Follow Up Instructions: See PCP in about 2-3 months   I discussed the assessment and treatment plan with the patient. The patient was provided an opportunity to ask questions and all were answered. The patient agreed with the plan and demonstrated an understanding of the instructions.   The patient was advised to call back or seek an in-person evaluation if the symptoms worsen or if the condition fails to  improve as anticipated.  I provided 17 minutes of non-face-to-face time during this encounter.   Georgian Co, PA-C

## 2020-03-06 ENCOUNTER — Other Ambulatory Visit: Payer: Self-pay | Admitting: Family Medicine

## 2020-03-06 ENCOUNTER — Telehealth: Payer: Self-pay | Admitting: Family Medicine

## 2020-03-06 DIAGNOSIS — E119 Type 2 diabetes mellitus without complications: Secondary | ICD-10-CM

## 2020-03-06 MED ORDER — TRUEPLUS LANCETS 28G MISC: 3 refills | Status: DC

## 2020-03-06 MED ORDER — TRUE METRIX BLOOD GLUCOSE TEST VI STRP: ORAL_STRIP | 12 refills | Status: DC

## 2020-03-06 NOTE — Telephone Encounter (Signed)
Copied from CRM [id number]. Topic: General - Other >> Mar 06, 2020  3:45 PM Marylen Ponto wrote: Reason for CRM: Pt requests that Helmut Muster call her regarding dentist referral

## 2020-03-06 NOTE — Telephone Encounter (Signed)
Copied from CRM [id number]. Topic: Quick Communication - Rx Refill/Question >> Mar 06, 2020  3:38 PM Mcneil, Ja-Kwan wrote: Medication: glucose blood (TRUE METRIX BLOOD GLUCOSE TEST) test strip and TRUEplus Lancets 28G MISC  Has the patient contacted their pharmacy? yes - Pt stated her pharmacy told her to call the office  Preferred Pharmacy (with phone number or street name): [id number] - [place], [place] - [address]  Phone: [phone]   Fax: [phone]  Agent: Please be advised that RX refills may take up to 3 business days. We ask that you follow-up with your pharmacy.

## 2020-03-13 ENCOUNTER — Telehealth: Payer: 59 | Admitting: Physician Assistant

## 2020-03-13 ENCOUNTER — Telehealth (INDEPENDENT_AMBULATORY_CARE_PROVIDER_SITE_OTHER): Payer: Self-pay

## 2020-03-13 NOTE — Telephone Encounter (Signed)
This is the first available appointment.

## 2020-03-13 NOTE — Telephone Encounter (Signed)
Please contact patient regarding abx ineffectiveness and appointment scheduling. Maryjean Morn, CMA     Copied from CRM [id number]. Topic: General - Other >> Mar 12, 2020  3:17 PM Randol Kern wrote: Reason for CRM: Pt wants to be contacted, states that the antibiotics she received in her latest appt did not resolve her symptoms. Wants to be seen sooner than what is scheduled   Best contact: [phone]

## 2020-03-17 ENCOUNTER — Telehealth: Payer: 59 | Admitting: Physician Assistant

## 2020-03-17 ENCOUNTER — Ambulatory Visit: Payer: No Typology Code available for payment source | Admitting: Family Medicine

## 2020-03-28 ENCOUNTER — Other Ambulatory Visit: Payer: Self-pay

## 2020-03-28 ENCOUNTER — Ambulatory Visit: Payer: 59 | Attending: Otolaryngology | Admitting: Otolaryngology

## 2020-03-28 ENCOUNTER — Encounter: Payer: Self-pay | Admitting: Otolaryngology

## 2020-03-28 VITALS — BP 90/67 | HR 117 | Ht 62.0 in | Wt 148.8 lb

## 2020-03-28 DIAGNOSIS — M542 Cervicalgia: Secondary | ICD-10-CM | POA: Diagnosis not present

## 2020-03-28 DIAGNOSIS — R1310 Dysphagia, unspecified: Secondary | ICD-10-CM | POA: Insufficient documentation

## 2020-03-28 DIAGNOSIS — R1314 Dysphagia, pharyngoesophageal phase: Secondary | ICD-10-CM | POA: Insufficient documentation

## 2020-03-28 DIAGNOSIS — K219 Gastro-esophageal reflux disease without esophagitis: Secondary | ICD-10-CM

## 2020-03-28 NOTE — Progress Notes (Signed)
Chief complaint: Ear and throat pressure  History: 55 year old black female comes in describing left ear pressure, neck discomfort, and swallowing issues.  She has known GE reflux.  She takes pantoprazole twice daily before meals.  She recently switched pharmacies, and the pills about 4 white instead of yellow.  She thinks this might be making a difference in her reflux control.  She describes phlegm which will get stuck in her throat and a "frothy glob", which she then has to spit out.    No pain.  No blood.  No obvious aspiration.  No change in voice.  Examination: She is energetic.  Mental status is sharp.  She hears well in conversational speech.  Voice is clear and respirations unlabored through the nose.  The head is atraumatic and neck supple.  Cranial nerves intact.  Ear canals are clear with normal aerated drums.  No TMJ tenderness or crepitus.  Anterior nose is moist and patent.  Oral cavity is clear with few remaining lower teeth in decent repair.  Oropharynx clear.  Neck without adenopathy.  No thyromegaly or nodules.  Impression: Left neck and ear pressure probably related to cervicalgia.  GE reflux with subjective change in control despite no change in medication management.  Plan: I would have her talk with her family physician about the tight neck.  I would have her continue antireflux management.  I will order a barium swallow and call her with these results.

## 2020-03-28 NOTE — Progress Notes (Signed)
Pt feel like there is something in her throat. Feels pressure in left ear.

## 2020-03-28 NOTE — Patient Instructions (Signed)
Your examination is basically clear today.  Your ears look normal.  You have a few remaining lower teeth in fairly good repair.  Try to keep these if possible.  I think you probably have some tight muscles in your neck causing ear pain.  You have some swallowing difficulty.  We will check a swallowing x-ray and call you with these results.

## 2020-03-29 ENCOUNTER — Other Ambulatory Visit: Payer: Self-pay | Admitting: Family Medicine

## 2020-03-29 NOTE — Telephone Encounter (Signed)
Requested medications are due for refill today yes  Requested medications are on the active medication list yes  Last refill 1/13  Last visit Do not see this med/dx addressed since 2019  Future visit scheduled next week  Notes to clinic Failed protocol of valid labs within 360 days, does have appt next week.

## 2020-04-01 ENCOUNTER — Ambulatory Visit
Admission: RE | Admit: 2020-04-01 | Discharge: 2020-04-01 | Disposition: A | Discharge status: Home / Self Care | Payer: 59 | Source: Ambulatory Visit | Attending: Otolaryngology | Admitting: Otolaryngology

## 2020-04-01 ENCOUNTER — Other Ambulatory Visit: Payer: Self-pay | Admitting: Otolaryngology

## 2020-04-01 DIAGNOSIS — K219 Gastro-esophageal reflux disease without esophagitis: Secondary | ICD-10-CM

## 2020-04-02 ENCOUNTER — Other Ambulatory Visit: Payer: Self-pay

## 2020-04-02 ENCOUNTER — Ambulatory Visit: Payer: 59 | Attending: Family Medicine | Admitting: Family Medicine

## 2020-04-02 VITALS — BP 104/72 | HR 107 | Ht 62.0 in | Wt 153.4 lb

## 2020-04-02 DIAGNOSIS — K0889 Other specified disorders of teeth and supporting structures: Secondary | ICD-10-CM

## 2020-04-02 DIAGNOSIS — K449 Diaphragmatic hernia without obstruction or gangrene: Secondary | ICD-10-CM | POA: Diagnosis not present

## 2020-04-02 DIAGNOSIS — E1159 Type 2 diabetes mellitus with other circulatory complications: Secondary | ICD-10-CM

## 2020-04-02 DIAGNOSIS — I152 Hypertension secondary to endocrine disorders: Secondary | ICD-10-CM

## 2020-04-02 DIAGNOSIS — E119 Type 2 diabetes mellitus without complications: Secondary | ICD-10-CM | POA: Diagnosis not present

## 2020-04-02 LAB — POCT GLYCOSYLATED HEMOGLOBIN (HGB A1C): HbA1c, POC (controlled diabetic range): 5.2 % (ref 0.0–7.0)

## 2020-04-02 LAB — GLUCOSE, POCT (MANUAL RESULT ENTRY): POC Glucose: 86 mg/dL (ref 70–99)

## 2020-04-02 NOTE — Progress Notes (Signed)
Subjective:  Patient ID: Christina Byrd, female    DOB: Mar 04, 1965  Age: 55 y.o. MRN: [id number]  CC: Diabetes   HPI Christina Byrd is a55 year old female with a history of type 2 diabetes mellitus (A1c 5.2), GERD, Seizureshere for chronic disease management. She was recently seen by ENT on 03/28/2020 where she was diagnosed with pharyngoesophageal dysphagia, GERD and cervicalgia.  Barium swallow was also ordered. Barium swallow revealed: IMPRESSION: 1. Normal esophageal motility. 2. No esophageal mass or stricture. 3. Small sliding-type hiatal hernia but no GE reflux.  During one of her visit she had requested going back on allopurinol as she complained her whole body hurt with Uloric and the physician assistant placed her back on allopurinol as per her request.  She has been adherent with a Diabetic diet and her A1c is 5.2.  She is currently on Metformin 250 mg daily.  Since she lost a lot of weight her A1c has improved gradually with resulting tapering down her dose of Metformin.  She denies hypoglycemia. She complains of vomiting with her current Amlodipine tablets from [facility] which are different from the one she received previously from [place] which she was able to tolerate.  She complains of the pharmacist and was informed that there was nothing they could do. She continues to complain of pain in her teeth and inability to see a dentist that would accept orange card as she has no medical coverage.  I have referred her to a dentist several times and most recent notes in her chart indicates she had declined to pay the co-pay required by [facility] which she denies.  Past Medical History:  Diagnosis Date  . GASTROESOPHAGEAL REFLUX, NO ESOPHAGITIS 04/21/2006   Qualifier: Diagnosis of  By: Benna Dunks    . GERD (gastroesophageal reflux disease) Dx 1995  . HTN (hypertension)     No past surgical history on file.  Family History  Problem Relation Age  of Onset  . CAD Mother     Allergies  Allergen Reactions  . Losartan Potassium     Rash  . Levaquin [Levofloxacin In D5w] Rash  . Ace Inhibitors Cough    REACTION: cough  . Codeine Nausea Only  . Cefepime Rash    09/04/12 pm Patient started to break out with small macules after IV Vanco infusion, then macules increased in size after starting cefepime infusion.  . Vancomycin Rash    09/04/12 pm Patient started to break out with small macules after IV Vanco infusion, then macules increased in size after starting cefepime infusion.    Outpatient Medications Prior to Visit  Medication Sig Dispense Refill  . allopurinol (ZYLOPRIM) 100 MG tablet Take 1 tablet (100 mg total) by mouth daily. 30 tablet 6  . amLODipine (NORVASC) 10 MG tablet Take 1 tablet (10 mg total) by mouth daily. 90 tablet 1  . aspirin EC 81 MG tablet Take 1 tablet (81 mg total) by mouth daily.    Marland Kitchen atorvastatin (LIPITOR) 20 MG tablet Take 1 tablet (20 mg total) by mouth daily. 90 tablet 1  . azithromycin (ZITHROMAX) 250 MG tablet 2 today then 1 daily 6 tablet 0  . Blood Glucose Monitoring Suppl (TRUE METRIX METER) DEVI 1 kit by Does not apply route 3 (three) times daily. CHECK BLOOD SUGAR UP TO 3 TIMES DAILY. E11.9 1 each 0  . cetirizine (ZYRTEC) 10 MG tablet Take 1 tablet (10 mg total) by mouth daily. 30 tablet 1  .  cloNIDine (CATAPRES) 0.1 MG tablet Take 1 tablet (0.1 mg total) by mouth at bedtime as needed. For hot flashes 30 tablet 1  . colchicine 0.6 MG tablet Take 2 tabs (1.2 mg) at the onset of a gout flare, may take 1 tab (0.6 mg) 1 hour later if symptoms persist 30 tablet 1  . diclofenac (VOLTAREN) 75 MG EC tablet Take 1 tablet (75 mg total) by mouth 2 (two) times daily. 60 tablet 1  . fluticasone (FLONASE) 50 MCG/ACT nasal spray Place 2 sprays into both nostrils daily. 16 g 6  . glucose blood (TRUE METRIX BLOOD GLUCOSE TEST) test strip Use as instructed 100 each 12  . levETIRAcetam (KEPPRA) 500 MG tablet Take 1  tablet (500 mg total) by mouth 2 (two) times daily. 60 tablet 11  . lidocaine (LIDODERM) 5 % Place 1 patch onto the skin daily. Remove & Discard patch within 12 hours or as directed by MD 30 patch 0  . magnesium oxide (MAG-OX) 400 MG tablet TAKE 1 TABLET BY MOUTH EVERY DAY 30 tablet 3  . metFORMIN (GLUCOPHAGE) 500 MG tablet Take 0.5 tablets (250 mg total) by mouth daily with breakfast. 45 tablet 6  . omeprazole (PRILOSEC) 20 MG capsule Take 1 capsule (20 mg total) by mouth 2 (two) times daily before a meal. 30 capsule 3  . pantoprazole (PROTONIX) 40 MG tablet Take 1 tablet (40 mg total) by mouth 2 (two) times daily before a meal. 60 tablet 3  . tiZANidine (ZANAFLEX) 4 MG tablet Take 1 tablet (4 mg total) by mouth every 8 (eight) hours as needed for muscle spasms. 60 tablet 1  . TRUEplus Lancets 28G MISC SMARTSIG:Topical 1 to 3 Times Daily 100 each 3   No facility-administered medications prior to visit.     ROS Review of Systems  Constitutional: Negative for activity change, appetite change and fatigue.  HENT: Negative for congestion, sinus pressure and sore throat.   Eyes: Negative for visual disturbance.  Respiratory: Negative for cough, chest tightness, shortness of breath and wheezing.   Cardiovascular: Negative for chest pain and palpitations.  Gastrointestinal: Positive for vomiting. Negative for abdominal distention, abdominal pain and constipation.  Endocrine: Negative for polydipsia.  Genitourinary: Negative for dysuria and frequency.  Musculoskeletal: Negative for arthralgias and back pain.  Skin: Negative for rash.  Neurological: Negative for tremors, light-headedness and numbness.  Hematological: Does not bruise/bleed easily.  Psychiatric/Behavioral: Negative for agitation and behavioral problems.    Objective:  BP 104/72   Pulse (!) 107   Ht $R'5\' 2"'LM$  (1.575 m)   Wt 153 lb 6.4 oz (69.6 kg)   LMP 12/17/2014   SpO2 99%   BMI 28.06 kg/m   BP/Weight 04/02/2020 03/28/2020  60/08/3708  Systolic BP 626 90 948  Diastolic BP 72 67 77  Wt. (Lbs) 153.4 148.8 147  BMI 28.06 27.22 26.89      Physical Exam Constitutional:      Appearance: She is well-developed.  Neck:     Vascular: No JVD.  Cardiovascular:     Rate and Rhythm: Tachycardia present.     Heart sounds: Normal heart sounds. No murmur heard.   Pulmonary:     Effort: Pulmonary effort is normal.     Breath sounds: Normal breath sounds. No wheezing or rales.  Chest:     Chest wall: No tenderness.  Abdominal:     General: Bowel sounds are normal. There is no distension.     Palpations: Abdomen is soft. There is  no mass.     Tenderness: There is no abdominal tenderness.  Musculoskeletal:        General: Normal range of motion.     Right lower leg: No edema.     Left lower leg: No edema.  Neurological:     Mental Status: She is alert and oriented to person, place, and time.  Psychiatric:        Mood and Affect: Mood normal.    Diabetic Foot Exam - Simple   Simple Foot Form Visual Inspection No deformities, no ulcerations, no other skin breakdown bilaterally: Yes See comments: Yes Sensation Testing Intact to touch and monofilament testing bilaterally: Yes Pulse Check Posterior Tibialis and Dorsalis pulse intact bilaterally: Yes Comments Dry skin on both soles      CMP Latest Ref Rng & Units 01/02/2020 04/24/2019 10/04/2018  Glucose 65 - 99 mg/dL 83 98 86  BUN 6 - 24 mg/dL $Remove'17 10 13  'pWTYZyH$ Creatinine 0.57 - 1.00 mg/dL 1.17(H) 0.92 0.95  Sodium 134 - 144 mmol/L 144 143 142  Potassium 3.5 - 5.2 mmol/L 4.8 4.2 4.2  Chloride 96 - 106 mmol/L 104 102 103  CO2 20 - 29 mmol/L $RemoveB'22 23 21  'GrgNmFui$ Calcium 8.7 - 10.2 mg/dL 9.6 9.4 8.3(L)  Total Protein 6.0 - 8.5 g/dL 7.1 - -  Total Bilirubin 0.0 - 1.2 mg/dL 0.4 - -  Alkaline Phos 44 - 121 IU/L 126(H) - -  AST 0 - 40 IU/L 14 - -  ALT 0 - 32 IU/L 10 - -    Lipid Panel     Component Value Date/Time   CHOL 151 01/02/2020 1100   TRIG 115 01/02/2020 1100    HDL 66 01/02/2020 1100   CHOLHDL 2.3 01/02/2020 1100   CHOLHDL 2.4 10/26/2012 1052   VLDL 46 (H) 10/26/2012 1052   LDLCALC 65 01/02/2020 1100   LDLDIRECT 153 (H) 01/30/2008 2146    CBC    Component Value Date/Time   WBC 7.4 04/24/2019 1459   WBC 8.7 11/10/2017 1144   RBC 4.13 04/24/2019 1459   RBC 4.36 11/10/2017 1144   HGB 12.9 04/24/2019 1459   HCT 38.5 04/24/2019 1459   PLT 264 04/24/2019 1459   MCV 93 04/24/2019 1459   MCH 31.2 04/24/2019 1459   MCH 29.4 11/10/2017 1144   MCHC 33.5 04/24/2019 1459   MCHC 32.3 11/10/2017 1144   RDW 14.8 04/24/2019 1459   LYMPHSABS 2.1 04/24/2019 1459   MONOABS 0.5 11/10/2017 1144   EOSABS 0.1 04/24/2019 1459   BASOSABS 0.0 04/24/2019 1459    Lab Results  Component Value Date   HGBA1C 5.2 04/02/2020    Assessment & Plan:  1. Controlled type 2 diabetes mellitus without complication, without long-term current use of insulin (HCC) Controlled with A1c of 5.2 Discontinue Metformin She will remain on dietary modifications and is motivated to do so Counseled on Diabetic diet, my plate method, 542 minutes of moderate intensity exercise/week Blood sugar logs with fasting goals of 80-120 mg/dl, random of less than 180 and in the event of sugars less than 60 mg/dl or greater than 400 mg/dl encouraged to notify the clinic. Advised on the need for annual eye exams, annual foot exams, Pneumonia vaccine. - POCT glucose (manual entry) - POCT glycosylated hemoglobin (Hb A1C)  2. Hiatal hernia This in addition to GERD could explain her vomiting She is adamant that vomiting is related to her amlodipine tablets and plans to switch to [facility] so she can obtain a  different brand of amlodipine Continue PPI  3. Hypertension associated with diabetes ([place]) Controlled Counseled on blood pressure goal of less than 130/80, low-sodium, DASH diet, medication compliance, 150 minutes of moderate intensity exercise per week. Discussed medication  compliance, adverse effects.  4. Pain, dental I have referred her several times to a dentist and have reviewed with her message received from [facility] where she had declined pain co-pay requested which she denies. She promises to search for another dental placed on her own.    No orders of the defined types were placed in this encounter.   Follow-up: Return in about 6 months (around 09/30/2020) for Chronic disease management.       Charlott Rakes, MD, FAAFP. [facility] [place] [place], [place]   04/02/2020, 4:44 PM

## 2020-04-02 NOTE — Patient Instructions (Signed)
Hiatal Hernia  A hiatal hernia occurs when part of the stomach slides above the muscle that separates the abdomen from the chest (diaphragm). A person can be born with a hiatal hernia (congenital), or it may develop over time. In almost all cases of hiatal hernia, only the top part of the stomach pushes through the diaphragm. Many people have a hiatal hernia with no symptoms. The larger the hernia, the more likely it is that you will have symptoms. In some cases, a hiatal hernia allows stomach acid to flow back into the tube that carries food from your mouth to your stomach (esophagus). This may cause heartburn symptoms. Severe heartburn symptoms may mean that you have developed a condition called gastroesophageal reflux disease (GERD). What are the causes? This condition is caused by a weakness in the opening (hiatus) where the esophagus passes through the diaphragm to attach to the upper part of the stomach. A person may be born with a weakness in the hiatus, or a weakness can develop over time. What increases the risk? This condition is more likely to develop in:  Older people. Age is a major risk factor for a hiatal hernia, especially if you are over the age of 50.  Pregnant women.  People who are overweight.  People who have frequent constipation. What are the signs or symptoms? Symptoms of this condition usually develop in the form of GERD symptoms. Symptoms include:  Heartburn.  Belching.  Indigestion.  Trouble swallowing.  Coughing or wheezing.  Sore throat.  Hoarseness.  Chest pain.  Nausea and vomiting. How is this diagnosed? This condition may be diagnosed during testing for GERD. Tests that may be done include:  X-rays of your stomach or chest.  An upper gastrointestinal (GI) series. This is an X-ray exam of your GI tract that is taken after you swallow a chalky liquid that shows up clearly on the X-ray.  Endoscopy. This is a procedure to look into your stomach  using a thin, flexible tube that has a tiny camera and light on the end of it. How is this treated? This condition may be treated by:  Dietary and lifestyle changes to help reduce GERD symptoms.  Medicines. These may include: ? Over-the-counter antacids. ? Medicines that make your stomach empty more quickly. ? Medicines that block the production of stomach acid (H2 blockers). ? Stronger medicines to reduce stomach acid (proton pump inhibitors).  Surgery to repair the hernia, if other treatments are not helping. If you have no symptoms, you may not need treatment. Follow these instructions at home: Lifestyle and activity  Do not use any products that contain nicotine or tobacco, such as cigarettes and e-cigarettes. If you need help quitting, ask your health care provider.  Try to achieve and maintain a healthy body weight.  Avoid putting pressure on your abdomen. Anything that puts pressure on your abdomen increases the amount of acid that may be pushed up into your esophagus. ? Avoid bending over, especially after eating. ? Raise the head of your bed by putting blocks under the legs. This keeps your head and esophagus higher than your stomach. ? Do not wear tight clothing around your chest or stomach. ? Try not to strain when having a bowel movement, when urinating, or when lifting heavy objects. Eating and drinking  Avoid foods that can worsen GERD symptoms. These may include: ? Fatty foods, like fried foods. ? Citrus fruits, like oranges or lemon. ? Other foods and drinks that contain acid, like   orange juice or tomatoes. ? Spicy food. ? Chocolate.  Eat frequent small meals instead of three large meals a day. This helps prevent your stomach from getting too full. ? Eat slowly. ? Do not lie down right after eating. ? Do not eat 1-2 hours before bed.  Do not drink beverages with caffeine. These include cola, coffee, cocoa, and tea.  Do not drink alcohol. General  instructions  Take over-the-counter and prescription medicines only as told by your health care provider.  Keep all follow-up visits as told by your health care provider. This is important. Contact a health care provider if:  Your symptoms are not controlled with medicines or lifestyle changes.  You are having trouble swallowing.  You have coughing or wheezing that will not go away. Get help right away if:  Your pain is getting worse.  Your pain spreads to your arms, neck, jaw, teeth, or back.  You have shortness of breath.  You sweat for no reason.  You feel sick to your stomach (nauseous) or you vomit.  You vomit blood.  You have bright red blood in your stools.  You have black, tarry stools. This information is not intended to replace advice given to you by your health care provider. Make sure you discuss any questions you have with your health care provider. Document Revised: 01/21/2017 Document Reviewed: 09/13/2016 Elsevier Patient Education  2021 Elsevier Inc.  

## 2020-04-09 NOTE — Progress Notes (Deleted)
CARDIOLOGY CONSULT NOTE       Patient ID: Christina Byrd MRN: [id number] DOB/AGE: 1965-10-29 55 y.o.  Admit date: (Not on file) Referring Physician: Margarita Rana Primary Physician: Charlott Rakes, MD Primary Cardiologist: Johnsie Cancel Reason for Consultation: CHF  Active Problems:   * No active hospital problems. *   HPI:  55 y.o. history of HTN, smoking, GERD and ETOH abuse  Chronic systolic CHF dating back to 2014 echo then showing EF 20-25% She was supposed to have right and left cath 02/2013 but had no insurance Had cough with ACE and facial pruritis with Cozaar In 2016 she was tried on hydralazine and imdur which she stopped due to headaches EF normalized on echo done in 2018 She quit smoking in 2015 Has tolerated HCTZ and coreg in past Currently diagnosed with DM BP being Rx with norvasc and clonidine . 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 Has had some nausea/vomiting thought to  Be GERD and and cervicalgia. 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 GASTROESOPHAGEAL REFLUX, NO ESOPHAGITIS 04/21/2006   Qualifier: Diagnosis of  By: Benna Dunks    . GERD (gastroesophageal reflux disease) Dx 1995  . HTN (hypertension)     Family History  Problem Relation Age of Onset  . CAD Mother     Social History   Socioeconomic History  . Marital status: Single    Spouse name: Not on file  . Number of children: Not on file  . Years of education: Not on file  . Highest education level: Not on file   Occupational History  . Not on file  Tobacco Use  . Smoking status: Former Smoker    Quit date: 06/25/2012    Years since quitting: 7.7  . Smokeless tobacco: Never Used  Vaping Use  . Vaping Use: Never used  Substance and Sexual Activity  . Alcohol use: Yes    Alcohol/week: 3.0 standard drinks    Types: 3 Standard drinks or equivalent per week    Comment: ADMITS TO DRINKING 3-4 BEERS/DAY  . Drug use: No  . Sexual activity: Not Currently  Other Topics Concern  . Not on file  Social History Narrative   Pt lives in single story home with her partner   Has 1 son   5 grandchildren   10th grade education   Works at [employer]    Social Determinants of Health   Financial Resource Strain: Not on file  Food Insecurity: Not on file  Transportation Needs: Not on file  Physical Activity: Not on file  Stress: Not on file  Social Connections: Not on file  Intimate Partner Violence: Not on file    No past surgical history on file.    Current Outpatient Medications:  .  allopurinol (ZYLOPRIM) 100 MG tablet, Take 1 tablet (100 mg total) by mouth daily., Disp: 30 tablet, Rfl: 6 .  amLODipine (NORVASC) 10 MG tablet, Take 1 tablet (10 mg total)  by mouth daily., Disp: 90 tablet, Rfl: 1 .  aspirin EC 81 MG tablet, Take 1 tablet (81 mg total) by mouth daily., Disp: , Rfl:  .  atorvastatin (LIPITOR) 20 MG tablet, Take 1 tablet (20 mg total) by mouth daily., Disp: 90 tablet, Rfl: 1 .  azithromycin (ZITHROMAX) 250 MG tablet, 2 today then 1 daily, Disp: 6 tablet, Rfl: 0 .  Blood Glucose Monitoring Suppl (TRUE METRIX METER) DEVI, 1 kit by Does not apply route 3 (three) times daily. CHECK BLOOD SUGAR UP TO 3 TIMES DAILY. E11.9, Disp: 1 each, Rfl: 0 .  cetirizine (ZYRTEC) 10 MG tablet, Take 1 tablet (10 mg total) by mouth daily., Disp: 30 tablet, Rfl: 1 .  cloNIDine (CATAPRES) 0.1 MG tablet, Take 1 tablet (0.1 mg total) by mouth at bedtime as needed. For hot flashes, Disp: 30 tablet, Rfl: 1 .   colchicine 0.6 MG tablet, Take 2 tabs (1.2 mg) at the onset of a gout flare, may take 1 tab (0.6 mg) 1 hour later if symptoms persist, Disp: 30 tablet, Rfl: 1 .  diclofenac (VOLTAREN) 75 MG EC tablet, Take 1 tablet (75 mg total) by mouth 2 (two) times daily., Disp: 60 tablet, Rfl: 1 .  fluticasone (FLONASE) 50 MCG/ACT nasal spray, Place 2 sprays into both nostrils daily., Disp: 16 g, Rfl: 6 .  glucose blood (TRUE METRIX BLOOD GLUCOSE TEST) test strip, Use as instructed, Disp: 100 each, Rfl: 12 .  levETIRAcetam (KEPPRA) 500 MG tablet, Take 1 tablet (500 mg total) by mouth 2 (two) times daily., Disp: 60 tablet, Rfl: 11 .  lidocaine (LIDODERM) 5 %, Place 1 patch onto the skin daily. Remove & Discard patch within 12 hours or as directed by MD, Disp: 30 patch, Rfl: 0 .  magnesium oxide (MAG-OX) 400 MG tablet, TAKE 1 TABLET BY MOUTH EVERY DAY, Disp: 30 tablet, Rfl: 3 .  omeprazole (PRILOSEC) 20 MG capsule, Take 1 capsule (20 mg total) by mouth 2 (two) times daily before a meal., Disp: 30 capsule, Rfl: 3 .  pantoprazole (PROTONIX) 40 MG tablet, Take 1 tablet (40 mg total) by mouth 2 (two) times daily before a meal., Disp: 60 tablet, Rfl: 3 .  tiZANidine (ZANAFLEX) 4 MG tablet, Take 1 tablet (4 mg total) by mouth every 8 (eight) hours as needed for muscle spasms., Disp: 60 tablet, Rfl: 1 .  TRUEplus Lancets 28G MISC, SMARTSIG:Topical 1 to 3 Times Daily, Disp: 100 each, Rfl: 3    Physical Exam: Last menstrual period 12/17/2014.    Affect appropriate Healthy:  appears stated age 36: normal Neck supple with no adenopathy JVP normal no bruits no thyromegaly Lungs clear with no wheezing and good diaphragmatic motion Heart:  S1/S2 no murmur, no rub, gallop or click PMI normal Abdomen: benighn, BS positve, no tenderness, no AAA no bruit.  No HSM or HJR Distal pulses intact with no bruits No edema Neuro non-focal Skin warm and dry No muscular weakness   Labs:   Lab Results  Component Value  Date   WBC 7.4 04/24/2019   HGB 12.9 04/24/2019   HCT 38.5 04/24/2019   MCV 93 04/24/2019   PLT 264 04/24/2019   No results for input(s): NA, K, CL, CO2, BUN, CREATININE, CALCIUM, PROT, BILITOT, ALKPHOS, ALT, AST, GLUCOSE in the last 168 hours.  Invalid input(s): LABALBU Lab Results  Component Value Date   KYHCWCB 762 (H) 09/06/2017   CKMBINDEX <1.0 07/23/2016   TROPONINI <0.03 07/09/2016    Lab Results  Component Value Date   CHOL 151 01/02/2020   CHOL 161 06/22/2017   CHOL 194 10/26/2012   Lab Results  Component Value Date   HDL 66 01/02/2020   HDL 44 06/22/2017   HDL 82 10/26/2012   Lab Results  Component Value Date   LDLCALC 65 01/02/2020   LDLCALC 66 06/22/2017   LDLCALC 66 10/26/2012   Lab Results  Component Value Date   TRIG 115 01/02/2020   TRIG 253 (H) 06/22/2017   TRIG 230 (H) 10/26/2012   Lab Results  Component Value Date   CHOLHDL 2.3 01/02/2020   CHOLHDL 3.7 06/22/2017   CHOLHDL 2.4 10/26/2012   Lab Results  Component Value Date   LDLDIRECT 153 (H) 01/30/2008   LDLDIRECT 133 (H) 08/12/2006      Radiology: DG ESOPHAGUS W DOUBLE CM (HD)  Result Date: 04/01/2020 CLINICAL DATA:  Esophageal dysphagia. EXAM: ESOPHOGRAM / BARIUM SWALLOW / BARIUM TABLET STUDY TECHNIQUE: Combined double contrast and single contrast examination performed using effervescent crystals, thick barium liquid, and thin barium liquid. The patient was observed with fluoroscopy swallowing a 13 mm barium sulphate tablet. FLUOROSCOPY TIME:  Fluoroscopy Time:  1 minutes and 42 seconds Radiation Exposure Index (if provided by the fluoroscopic device): 118 mGy Number of Acquired Spot Images: 8 COMPARISON:  None FINDINGS: Initial barium swallows demonstrate normal pharyngeal motion with swallowing. No laryngeal penetration or aspiration. No upper esophageal webs, strictures or diverticuli. Mildly prominent cricopharyngeal impression noted but no Zenker's diverticulum. Normal esophageal  motility. No intrinsic or extrinsic lesions affecting the esophagus are identified. Very small sliding-type hiatal hernia noted but no demonstrable GE reflux. The 13 mm barium pill passed into the stomach without difficulty. IMPRESSION: 1. Normal esophageal motility. 2. No esophageal mass or stricture. 3. Small sliding-type hiatal hernia but no GE reflux. Electronically Signed   By: Marijo Sanes M.D.   On: 04/01/2020 11:47    EKG: SR rate 99 poor R wave progression 11/10/17 04/15/19 SR rate 95 nonspecific ST changes    ASSESSMENT AND PLAN:   1. DCM:  History of with low normal EF by TTE 05/02/19 50-55% Rx limited by medication side Effects not on ACE/ARB/Entresto  2.  HTN:  Well controlled.  Continue current medications and low sodium Dash type diet.   3. DM:  Discussed low carb diet.  Target hemoglobin A1c is 6.5 or less.  Continue current medications. A1c improved 5.2  4. Seizures:  F/U neurology Dr Delice Lesch continue Keppra discussed ETOH as trigger no seizures over a year  5. ETOH: see above also concern for DCM related to alcohol discussed moderation and long term effects  6. HLD:  On statin LDL 65 12/25/19   F/U in a year   Signed: Jenkins Rouge 04/09/2020, 1:05 PM

## 2020-04-15 ENCOUNTER — Ambulatory Visit: Payer: 59 | Admitting: Cardiovascular Disease

## 2020-04-18 ENCOUNTER — Telehealth: Payer: Self-pay

## 2020-04-18 NOTE — Telephone Encounter (Signed)
Patient was called and a voicemail was left informing patient to return phone call for lab results. 

## 2020-04-18 NOTE — Progress Notes (Signed)
Barium swallow did not show any masses or strictures.  It also did not show any reflux.  If you continue to have trouble with swallowing, I would have her see a gastroenterologist.

## 2020-04-18 NOTE — Telephone Encounter (Signed)
-----   Message from Flo Shanks, MD sent at 04/18/2020 11:21 AM EST ----- Barium swallow did not show any masses or strictures.  It also did not show any reflux.  If you continue to have trouble with swallowing, I would have her see a gastroenterologist.

## 2020-04-22 ENCOUNTER — Telehealth: Payer: Self-pay | Admitting: Family Medicine

## 2020-04-22 NOTE — Telephone Encounter (Signed)
I return Pt call, she was inform that she has insurance and before she can apply for any of our program she can have insurance

## 2020-04-22 NOTE — Telephone Encounter (Signed)
Copied from CRM [id number]. Topic: General - Other >> Apr 22, 2020 11:42 AM Marylen Ponto wrote: Reason for CRM: Pt requests that [facility] call her back. Cb# [phone]

## 2020-04-28 ENCOUNTER — Telehealth: Payer: Self-pay | Admitting: Family Medicine

## 2020-04-28 ENCOUNTER — Ambulatory Visit: Payer: Self-pay | Admitting: *Deleted

## 2020-04-28 NOTE — Telephone Encounter (Signed)
Attempted to call patient and advise that she could go to the mobile unit bust today or tomorrow since there were no available appts.   Patient shouting that she has an appointment in this office this week. It was scheduled with a man. Demanding that I find the man that made the appt to verify her appt. .   Informed patient that she has an appt this month, March with [facility]. She verbalized understanding to that but did not understand what happened to the apt she was just given by a man. Patient continued to shout at nurse and hung up the phone.

## 2020-04-28 NOTE — Telephone Encounter (Signed)
Pt called in c/o the [facility] was [facility] is giving her bad diarrhea.  She was getting her Rx from the [facility] but they got [facility] ever since then she has been having diarrhea.  "All my pills [facility]".   "When I was taking my pills from your pharmacy ([facility]) they were different colors and sizes".   "They don't even put on the bottles what the pills are for".   "I need to get off of the medications from [facility]".   "That's when all the diarrhea started".   "I can't do anything for going to the bathroom".   "Somettimes I don't make it to the toilet it's so bad".  Pt is going to [facility] this evening to get her orange card.  There are no appts available.  She wants to be seen there because she believes the medication switchover is what is causing her diarrhea, otherwise I would recommend an urgent care for the diarrhea.  I have sent my notes to [facility].    Reason for Disposition . [1] SEVERE diarrhea (e.g., 7 or more times / day more than normal) AND [2] present > 24 hours (1 day)  Answer Assessment - Initial Assessment Questions 1. DIARRHEA SEVERITY: "How bad is the diarrhea?" "How many extra stools have you had in the past 24 hours than normal?"    - NO DIARRHEA (SCALE 0)   - MILD (SCALE 1-3): Few loose or mushy BMs; increase of 1-3 stools over normal daily number of stools; mild increase in ostomy output.   -  MODERATE (SCALE 4-7): Increase of 4-6 stools daily over normal; moderate increase in ostomy output. * SEVERE (SCALE 8-10; OR 'WORST POSSIBLE'): Increase of 7 or more stools daily over normal; moderate increase in ostomy output; incontinence.     Having diarrhea since [facility].   My orange card ran out.   I'm coming today to see someone about my orange card.   2. ONSET: "When did the diarrhea begin?"    The diarrhea started when I first started taking [facility].   I'm  trying to get my medications back to [facility] pharmacy.    3. BM CONSISTENCY: "How loose or watery is the diarrhea?"      When I take the acid reflux pill my stools are solid black.    They were a different [facility] than [facility] I was getting [facility].   I have no problem with the medications from [facility].   The [facility] pills make me sick with diarrhea.     4. VOMITING: "Are you also vomiting?" If Yes, ask: "How many times in the past 24 hours?"      No vomiting. 5. ABDOMINAL PAIN: "Are you having any abdominal pain?" If Yes, ask: "What does it feel like?" (e.g., crampy, dull, intermittent, constant)      When I take the acid reflux pill before I eat then after I eat I get the diarrhea.   No cramps.      6. ABDOMINAL PAIN SEVERITY: If present, ask: "How bad is the pain?"  (e.g., Scale 1-10; mild, moderate, or severe)   - MILD (1-3): doesn't interfere with normal activities, abdomen soft and not tender to touch    - MODERATE (4-7): interferes with normal activities or awakens from sleep, tender to touch    - SEVERE (8-10): excruciating pain,  doubled over, unable to do any normal activities       No pain 7. ORAL INTAKE: If vomiting, "Have you been able to drink liquids?" "How much fluids have you had in the past 24 hours?"     I'm eating and drinking.    Any time I eat I have to go have diarrhea even after drinking water. 8. HYDRATION: "Any signs of dehydration?" (e.g., dry mouth [not just dry lips], too weak to stand, dizziness, new weight loss) "When did you last urinate?"     I'm drinking water and liquids fine.    9. EXPOSURE: "Have you traveled to a foreign country recently?" "Have you been exposed to anyone with diarrhea?" "Could you have eaten any food that was spoiled?"     No  The heart pill is a different size.   10. ANTIBIOTIC USE: "Are you taking antibiotics now or have you taken antibiotics in the past 2 months?"       No 11. OTHER SYMPTOMS: "Do you have any other symptoms?" (e.g.,  fever, blood in stool)       No 12. PREGNANCY: "Is there any chance you are pregnant?" "When was your last menstrual period?"       N/A  Protocols used: DIARRHEA-A-AH

## 2020-04-28 NOTE — Telephone Encounter (Signed)
Patient feels that their omeprazole (PRILOSEC) 20 MG capsule prescription is causing stomach discomfort  Patient shares that they're experiencing dark bowel movements  Please contact to advise

## 2020-04-28 NOTE — Telephone Encounter (Signed)
Attempted to call patient and advise that she could go to the mobile unit bust today or tomorrow since there were no available appts with PCP at [facility].   Patient shouting that she has an appointment in this office this week. It was scheduled with a man. Demanding that I find the man that made the appt to verify her appt. .   Informed patient that she has an appt this month, March with [facility]. She verbalized understanding to that but did not understand what happened to the apt she was just given by a man. Patient continued to shout at nurse and hung up the phone.

## 2020-04-29 ENCOUNTER — Telehealth: Payer: Self-pay | Admitting: Family Medicine

## 2020-04-29 NOTE — Telephone Encounter (Signed)
Copied from CRM [id number]. Topic: General - Other >> Apr 28, 2020  3:44 PM Christina Byrd A wrote: Reason for CRM: Patient would like to be contacted regarding financial counseling Patient dropped off tax information for their orange card earlier today 04/28/20  Please contact to advise

## 2020-04-29 NOTE — Telephone Encounter (Signed)
I return Pt call, Pt was inform that she can not apply for the [employer] and [facility] program at this time since she has insurance

## 2020-05-01 ENCOUNTER — Telehealth: Payer: Self-pay | Admitting: Family Medicine

## 2020-05-01 NOTE — Telephone Encounter (Signed)
Copied from CRM [id number]. Topic: General - Other >> May 01, 2020  3:43 PM Gwenlyn Fudge wrote: Reason for CRM: Pt called and is requesting to speak with PCP regarding her medications. Please advise.

## 2020-05-02 ENCOUNTER — Other Ambulatory Visit: Payer: Self-pay

## 2020-05-02 ENCOUNTER — Ambulatory Visit (INDEPENDENT_AMBULATORY_CARE_PROVIDER_SITE_OTHER): Payer: 59 | Admitting: Neurology

## 2020-05-02 ENCOUNTER — Encounter: Payer: Self-pay | Admitting: Neurology

## 2020-05-02 VITALS — BP 105/72 | HR 78 | Resp 20 | Ht 62.0 in | Wt 148.0 lb

## 2020-05-02 DIAGNOSIS — G40209 Localization-related (focal) (partial) symptomatic epilepsy and epileptic syndromes with complex partial seizures, not intractable, without status epilepticus: Secondary | ICD-10-CM | POA: Diagnosis not present

## 2020-05-02 NOTE — Patient Instructions (Signed)
Good to see you! Continue Levetiracetam 500mg  twice a day. Continue cutting down slowly on alcohol intake. Follow-up in 1 year, call for any changes.   Seizure Precautions: 1. If medication has been prescribed for you to prevent seizures, take it exactly as directed.  Do not stop taking the medicine without talking to your doctor first, even if you have not had a seizure in a long time.   2. Avoid activities in which a seizure would cause danger to yourself or to others.  Don't operate dangerous machinery, swim alone, or climb in high or dangerous places, such as on ladders, roofs, or girders.  Do not drive unless your doctor says you may.  3. If you have any warning that you may have a seizure, lay down in a safe place where you can't hurt yourself.    4.  No driving for 6 months from last seizure, as per [facility].   Please refer to the following link on the Epile[employer] of America's website for more information: http://www.epilepsyfoundation.org/answerplace/Social/driving/drivingu.cfm   5.  Maintain good sleep hygiene. Avoid alcohol.  6.  Contact your doctor if you have any problems that may be related to the medicine you are taking.  7.  Call 911 and bring the patient back to the ED if:        A.  The seizure lasts longer than 5 minutes.       B.  The patient doesn't awaken shortly after the seizure  C.  The patient has new problems such as difficulty seeing, speaking or moving  D.  The patient was injured during the seizure  E.  The patient has a temperature over 102 F (39C)  F.  The patient vomited and now is having trouble breathing

## 2020-05-02 NOTE — Progress Notes (Signed)
NEUROLOGY FOLLOW UP OFFICE NOTE  Christina Byrd [id number] [birth year]  HISTORY OF PRESENT ILLNESS: I had the pleasure of seeing Christina Byrd in follow-up in the neurology clinic on 05/02/2020.  The patient was last seen 7 months ago for seizures suggestive of temporal lobe epilepsy. She is again accompanied by her significant other who helps supplement the history today. They continue to deny any seizures since May 2020. She is on Levetiracetam 529m BID. She rarely smells an odd smell. She denies any staring/unresponsive episodes, gaps in time, focal numbness/tingling/weakness, myoclonic jerks. No headaches, dizziness, vision changes, no falls. She has occasional muscle spasms on the left shoulder, usually when she does a lot of lifting. No neck pain.  She had been having diarrhea that she attributes to [facility], [facility] [place]. She continues to drink alcohol, 2 beers and 6-8 shots daily "just to mellow out." She reports some memory loss, forgetting what she came to get in a room. She rarely drives. Mood and sleep are good.   History on Initial Assessment 11/18/2017: This is a 55year old right-handed woman with a history of alcohol abuse, diabetes, hypertension, presenting for evaluation of recurrent seizures. The first seizure occurred in May 2018, she had a seizure in her sleep, then had another one before EMS arrived. Her significant other states she was in a daze and not responding in between seizures, then had another en route to the hospital. Per ER notes, she had 4 seizures that day. No focal weakness. Her CK was elevated, UDS positive for cocaine. 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 EEG normal. 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 She was seizure-free for almost a year until July 2019 when she had  another convulsion. She denies any prior warning symptoms, waking up in the ambulance. At that time, her potassium and magnesium levels were very low. UDS negative. She was discharged home on Keppra but had not been taking it because she would have nausea and vomiting taking it together with her other medications. She had a third seizure on 11/10/2017, her significant other reports that this also occurred in sleep, she had heavy breathing with urinary incontinence. She would be confused after, no focal weakness. She was discharged home on Keppra 5037mBID which she is tolerating without side effects now that she is taking medications separately. She has occasional episodes where she smells an odd/terrible smell like blood or trash. Her significant other denies any staring/unresponsive episodes except in between the seizures. She denies any gaps in time, rising epigastric sensation, focal numbness/tingling/weakness, deja vu sensations, or myoclonic jerks. She has a history of heavy alcohol use, she usually drinks 2 beers and 4 shots of liquor every night, at most 3 12-oz beers and 6 shots of liquor. She recalls that the night prior to the seizures she had more alcohol and was up until 3am. She denies any headaches, dizziness, vision changes, neck/back pain, bowel/bladder dysfunction except for occasional constipation. Her father had seizures due to alcohol. Otherwise she had a normal birth and early development.  There is no history of febrile convulsions, CNS infections such as meningitis/encephalitis, significant traumatic brain injury, neurosurgical procedures.  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 EEG  in 2018 was normal.   PAST MEDICAL HISTORY: Past Medical History:  Diagnosis Date  . GASTROESOPHAGEAL REFLUX, NO ESOPHAGITIS 04/21/2006   Qualifier: Diagnosis of  By: Benna Dunks    . GERD (gastroesophageal  reflux disease) Dx 1995  . HTN (hypertension)     MEDICATIONS: Current Outpatient Medications on File Prior to Visit  Medication Sig Dispense Refill  . allopurinol (ZYLOPRIM) 100 MG tablet Take 1 tablet (100 mg total) by mouth daily. 30 tablet 6  . amLODipine (NORVASC) 10 MG tablet Take 1 tablet (10 mg total) by mouth daily. 90 tablet 1  . aspirin EC 81 MG tablet Take 1 tablet (81 mg total) by mouth daily.    Marland Kitchen atorvastatin (LIPITOR) 20 MG tablet Take 1 tablet (20 mg total) by mouth daily. 90 tablet 1  . azithromycin (ZITHROMAX) 250 MG tablet 2 today then 1 daily 6 tablet 0  . Blood Glucose Monitoring Suppl (TRUE METRIX METER) DEVI 1 kit by Does not apply route 3 (three) times daily. CHECK BLOOD SUGAR UP TO 3 TIMES DAILY. E11.9 1 each 0  . cetirizine (ZYRTEC) 10 MG tablet Take 1 tablet (10 mg total) by mouth daily. 30 tablet 1  . cloNIDine (CATAPRES) 0.1 MG tablet Take 1 tablet (0.1 mg total) by mouth at bedtime as needed. For hot flashes 30 tablet 1  . colchicine 0.6 MG tablet Take 2 tabs (1.2 mg) at the onset of a gout flare, may take 1 tab (0.6 mg) 1 hour later if symptoms persist 30 tablet 1  . diclofenac (VOLTAREN) 75 MG EC tablet Take 1 tablet (75 mg total) by mouth 2 (two) times daily. 60 tablet 1  . fluticasone (FLONASE) 50 MCG/ACT nasal spray Place 2 sprays into both nostrils daily. 16 g 6  . glucose blood (TRUE METRIX BLOOD GLUCOSE TEST) test strip Use as instructed 100 each 12  . levETIRAcetam (KEPPRA) 500 MG tablet Take 1 tablet (500 mg total) by mouth 2 (two) times daily. 60 tablet 11  . lidocaine (LIDODERM) 5 % Place 1 patch onto the skin daily. Remove & Discard patch within 12 hours or as directed by MD 30 patch 0  . magnesium oxide (MAG-OX) 400 MG tablet TAKE 1 TABLET BY MOUTH EVERY DAY 30 tablet 3  . omeprazole (PRILOSEC) 20 MG capsule Take 1 capsule (20 mg total) by mouth 2 (two) times daily before a meal. 30 capsule 3  . pantoprazole (PROTONIX) 40 MG tablet Take 1 tablet (40  mg total) by mouth 2 (two) times daily before a meal. 60 tablet 3  . tiZANidine (ZANAFLEX) 4 MG tablet Take 1 tablet (4 mg total) by mouth every 8 (eight) hours as needed for muscle spasms. 60 tablet 1  . TRUEplus Lancets 28G MISC SMARTSIG:Topical 1 to 3 Times Daily 100 each 3   No current facility-administered medications on file prior to visit.    ALLERGIES: Allergies  Allergen Reactions  . Losartan Potassium     Rash  . Levaquin [Levofloxacin In D5w] Rash  . Ace Inhibitors Cough    REACTION: cough  . Codeine Nausea Only  . Cefepime Rash    09/04/12 pm Patient started to break out with small macules after IV Vanco infusion, then macules increased in size after starting cefepime infusion.  . Vancomycin Rash    09/04/12 pm Patient started to break out with small macules after IV Vanco infusion, then macules increased in size after starting cefepime infusion.    FAMILY HISTORY: Family History  Problem Relation Age of Onset  . CAD Mother     SOCIAL HISTORY: Social History   Socioeconomic History  . Marital status: Single    Spouse name: Not on file  . Number of children: Not on file  . Years of education: Not on file  . Highest education level: Not on file  Occupational History  . Not on file  Tobacco Use  . Smoking status: Former Smoker    Quit date: 06/25/2012    Years since quitting: 7.8  . Smokeless tobacco: Never Used  Vaping Use  . Vaping Use: Never used  Substance and Sexual Activity  . Alcohol use: Yes    Alcohol/week: 3.0 standard drinks    Types: 3 Standard drinks or equivalent per week    Comment: ADMITS TO DRINKING 3-4 BEERS/DAY  . Drug use: No  . Sexual activity: Not Currently  Other Topics Concern  . Not on file  Social History Narrative   Pt lives in single story home with her partner   Has 1 son   5 grandchildren   10th grade education   Works at [employer]    Social Determinants of Health   Financial Resource Strain: Not on file  Food  Insecurity: Not on file  Transportation Needs: Not on file  Physical Activity: Not on file  Stress: Not on file  Social Connections: Not on file  Intimate Partner Violence: Not on file     PHYSICAL EXAM: Vitals:   05/02/20 1531  BP: 105/72  Pulse: 78  Resp: 20  SpO2: 100%   General: No acute distress Head:  Normocephalic/atraumatic Skin/Extremities: No rash, no edema Neurological Exam: alert and awake. No aphasia or dysarthria. Fund of knowledge is appropriate.  Attention and concentration are normal.   Cranial nerves: Pupils equal, round. Extraocular movements intact with no nystagmus. Visual fields full.  No facial asymmetry.  Motor: Bulk and tone normal, muscle strength 5/5 throughout with no pronator drift.   Finger to nose testing intact.  Gait narrow-based and steady, able to tandem walk adequately.  Romberg negative.   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 She continues to deny any seizures since May 2020, continue Levetiracetam 57m BID, recent refills (for 1 year) sent by her PCP. We again discussed how alcohol can trigger seizures (and memory loss), encouraged reduction and eventual cessation of alcohol. She is aware of [place] driving laws to stop driving after a seizure until 6 month seizure-free. Follow-up in 1 year, she knows to call for any changes.    Thank you for allowing me to participate in her care.  Please do not hesitate to call for any questions or concerns.   KEllouise Newer M.D.   CC: Dr. NMargarita Rana

## 2020-05-02 NOTE — Telephone Encounter (Signed)
Pt was called and matter has been resolved. Pt medications will be transferred back to [facility] pharmacy.

## 2020-05-08 ENCOUNTER — Telehealth: Payer: Self-pay | Admitting: Family Medicine

## 2020-05-08 NOTE — Telephone Encounter (Signed)
Pt is upset as she has request a cb yesterday and waited and waited did not receive. Pt states when ask that it is regard a discussion about her medications FU at [phone]

## 2020-05-08 NOTE — Telephone Encounter (Signed)
Copied from CRM [id number]. Topic: General - Other >> May 07, 2020  5:04 PM Marylen Ponto wrote: Reason for CRM: Pt requests call back from [place]. Cb# [phone]

## 2020-05-08 NOTE — Telephone Encounter (Signed)
Pt is upset because the pills that she recently got from the pharmacy are not the same shape and color as before, pt was explained to that the medication manufacture may change at times, pt began to get upset and states she is not taking any medication that is going to make her sick.

## 2020-05-13 ENCOUNTER — Telehealth: Payer: Self-pay | Admitting: Family Medicine

## 2020-05-13 NOTE — Telephone Encounter (Signed)
Copied from CRM [id number]. Topic: General - Inquiry >> May 12, 2020  2:49 PM Daphine Deutscher D wrote: Reason for CRM: Pt would like someone to call her back regarding the shingles vaccine   CB#  [phone]

## 2020-05-13 NOTE — Telephone Encounter (Signed)
Pt was called and all questions were answered. 

## 2020-05-19 ENCOUNTER — Other Ambulatory Visit: Payer: Self-pay | Admitting: Pharmacist

## 2020-05-19 DIAGNOSIS — E119 Type 2 diabetes mellitus without complications: Secondary | ICD-10-CM

## 2020-05-19 DIAGNOSIS — M1A0610 Idiopathic chronic gout, right knee, without tophus (tophi): Secondary | ICD-10-CM

## 2020-05-19 DIAGNOSIS — R253 Fasciculation: Secondary | ICD-10-CM

## 2020-05-19 DIAGNOSIS — I10 Essential (primary) hypertension: Secondary | ICD-10-CM

## 2020-05-19 DIAGNOSIS — R569 Unspecified convulsions: Secondary | ICD-10-CM

## 2020-05-19 DIAGNOSIS — H6690 Otitis media, unspecified, unspecified ear: Secondary | ICD-10-CM

## 2020-05-19 DIAGNOSIS — K219 Gastro-esophageal reflux disease without esophagitis: Secondary | ICD-10-CM

## 2020-05-19 DIAGNOSIS — R232 Flushing: Secondary | ICD-10-CM

## 2020-05-19 MED ORDER — AMLODIPINE BESYLATE 10 MG PO TABS: 10.0000 mg | ORAL_TABLET | Freq: Every day | ORAL | 1 refills | Status: DC

## 2020-05-19 MED ORDER — ATORVASTATIN CALCIUM 20 MG PO TABS: 20.0000 mg | ORAL_TABLET | Freq: Every day | ORAL | 1 refills | Status: DC

## 2020-05-19 MED ORDER — CLONIDINE HCL 0.1 MG PO TABS: 0.1000 mg | ORAL_TABLET | Freq: Every evening | ORAL | 1 refills | Status: DC | PRN

## 2020-05-19 MED ORDER — TIZANIDINE HCL 4 MG PO TABS: 4.0000 mg | ORAL_TABLET | Freq: Three times a day (TID) | ORAL | 1 refills | Status: DC | PRN

## 2020-05-19 MED ORDER — LEVETIRACETAM 500 MG PO TABS: 500.0000 mg | ORAL_TABLET | Freq: Two times a day (BID) | ORAL | 11 refills | Status: DC

## 2020-05-19 MED ORDER — MAGNESIUM OXIDE 400 MG PO TABS: 1.0000 | ORAL_TABLET | Freq: Every day | ORAL | 3 refills | Status: DC

## 2020-05-19 MED ORDER — COLCHICINE 0.6 MG PO TABS: ORAL_TABLET | ORAL | 1 refills | Status: DC

## 2020-05-19 MED ORDER — DICLOFENAC SODIUM 75 MG PO TBEC: 75.0000 mg | DELAYED_RELEASE_TABLET | Freq: Two times a day (BID) | ORAL | 1 refills | Status: DC

## 2020-05-19 MED ORDER — ALLOPURINOL 100 MG PO TABS: 100.0000 mg | ORAL_TABLET | Freq: Every day | ORAL | 6 refills | Status: DC

## 2020-05-19 MED ORDER — OMEPRAZOLE 20 MG PO CPDR: 20.0000 mg | DELAYED_RELEASE_CAPSULE | Freq: Two times a day (BID) | ORAL | 3 refills | Status: DC

## 2020-05-19 MED ORDER — FLUTICASONE PROPIONATE 50 MCG/ACT NA SUSP: 2.0000 | Freq: Every day | NASAL | 6 refills | Status: DC

## 2020-05-23 ENCOUNTER — Telehealth: Payer: Self-pay | Admitting: Family Medicine

## 2020-05-23 NOTE — Telephone Encounter (Signed)
Pt has an upcoming appointment with a new provider in May.

## 2020-05-23 NOTE — Telephone Encounter (Signed)
Copied from CRM [id number]. Topic: General - Call Back - No Documentation >> May 22, 2020 12:27 PM Randol Kern wrote: Reason for CRM: Pt called and reported that she is going to be coming into the office today since she has not received a call from the office to resolve her needs. She wants her heart checked out and she needs to be sent to a hand specialist. The medication she received from PCP is affecting the mobility of her hands.   Febuxostat 40 MG is the medication that she says has caused this. Please advise   Best contact: [phone]  She wants a new PCP

## 2020-05-23 NOTE — Telephone Encounter (Signed)
Copied from CRM [id number]. Topic: General - Other >> May 22, 2020 12:10 PM Gaetana Michaelis A wrote: Reason for CRM: Patient would like to be contacted by a member of administrative staff when possible (patient would prefer to speak with "Helmut Muster")  Patient has concerns regarding their appointment as well as medication related concerns  Patient stressed the importance of a returned call  Please contact to advise further when possible

## 2020-05-24 ENCOUNTER — Other Ambulatory Visit: Payer: Self-pay

## 2020-05-27 ENCOUNTER — Telehealth: Payer: Self-pay | Admitting: Family Medicine

## 2020-05-27 NOTE — Telephone Encounter (Signed)
Copied from CRM [id number]. Topic: General - Other >> May 26, 2020  2:41 PM Pawlus, Maxine Glenn A wrote: Reason for CRM: Pt wanted to speak with Helmut Muster. Pt stated she wanted to discuss some of her medical needs, also stated the wrong medications have been sent her for her. Please advise. >> May 26, 2020  5:25 PM Uvaldo Rising, Jannifer Rodney wrote: Pt stated she really needs to speak with Alycia regarding her medications. Pt requests call back asap

## 2020-05-28 NOTE — Telephone Encounter (Signed)
Patient was called and VM is not set up to leave a message.  Patient can go to Mobile Unit located at [address] from [place] today.

## 2020-05-28 NOTE — Telephone Encounter (Signed)
Pt called back and stated she did not want the location for mobile unit today. She stated she has an upcoming appointment with cardiology and will wait until then

## 2020-06-02 ENCOUNTER — Telehealth: Payer: Self-pay | Admitting: Family Medicine

## 2020-06-02 NOTE — Telephone Encounter (Signed)
Copied from CRM [id number]. Topic: General - Call Back - No Documentation >> Jun 02, 2020  3:59 PM Aretta Nip wrote: Reason for CRM: Pt is calling stating she wants to discuss her insurance with Mikle Bosworth. Pt would not divulge any details just wants Mikle Bosworth to call  her at [phone]

## 2020-06-02 NOTE — Progress Notes (Signed)
Cardiology Office Note:    Date:  06/03/2020   ID:  Christina Byrd, DOB 07-Jan-1966, MRN [id number]  PCP:  Charlott Rakes, [place]  Cardiologist:  Jenkins Rouge, MD   Electrophysiologist:  None       Referring MD: Charlott Rakes, MD   Chief Complaint:  Follow-up (Cardiomyopathy/)    Patient Profile:     Christina Byrd is a 55 y.o. female with:   Hypertension   GERD  ETOH abuse   HFimpEF (heart failure with improved ejection fraction)   Echocardiogram 8/14: EF 20-25  Echocardiogram 3/21: EF 50-55  Diabetes mellitus, diet controlled  Hyperlipidemia   Cardiac catheterization planned in 2015 but could not do (no insurance)  Seizures   Prior CV studies: Echocardiogram 05/02/19 1. Left ventricular ejection fraction, by estimation, is 50 to 55%. The  left ventricle has low normal function. The left ventricle has no regional  wall motion abnormalities. Left ventricular diastolic parameters are  consistent with Grade I diastolic  dysfunction (impaired relaxation). The average left ventricular global  longitudinal strain is -19.9 %.  2. Right ventricular systolic function is normal. The right ventricular  size is normal.  3. The mitral valve is normal in structure. No evidence of mitral valve  regurgitation. No evidence of mitral stenosis.  4. The aortic valve is normal in structure. Aortic valve regurgitation is  not visualized. No aortic stenosis is present.     History of Present Illness:    [name] was last seen by Dr. Johnsie Cancel in 2/21.  She returns for f/u. She is here with her husband.  She is doing well without chest pain, shortness of breath, syncope, leg edema or orthopnea.       Past Medical History:  Diagnosis Date  . GASTROESOPHAGEAL REFLUX, NO ESOPHAGITIS 04/21/2006   Qualifier: Diagnosis of  By: Benna Dunks    . GERD (gastroesophageal reflux disease) Dx 1995  . HTN (hypertension)     Current  Medications: Current Meds  Medication Sig  . amLODipine (NORVASC) 10 MG tablet Take 1 tablet (10 mg total) by mouth daily.  Marland Kitchen aspirin EC 81 MG tablet Take 1 tablet (81 mg total) by mouth daily.  Marland Kitchen atorvastatin (LIPITOR) 20 MG tablet Take 1 tablet (20 mg total) by mouth daily.  . Blood Glucose Monitoring Suppl (TRUE METRIX METER) DEVI 1 kit by Does not apply route 3 (three) times daily. CHECK BLOOD SUGAR UP TO 3 TIMES DAILY. E11.9  . diclofenac (VOLTAREN) 75 MG EC tablet Take 1 tablet (75 mg total) by mouth 2 (two) times daily.  Marland Kitchen glucose blood (TRUE METRIX BLOOD GLUCOSE TEST) test strip Use as instructed  . levETIRAcetam (KEPPRA) 500 MG tablet Take 1 tablet (500 mg total) by mouth 2 (two) times daily.  . magnesium oxide (MAG-OX) 400 MG tablet Take 1 tablet (400 mg total) by mouth daily.  Marland Kitchen tiZANidine (ZANAFLEX) 4 MG tablet Take 1 tablet (4 mg total) by mouth every 8 (eight) hours as needed for muscle spasms.  . TRUEplus Lancets 28G MISC SMARTSIG:Topical 1 to 3 Times Daily     Allergies:   Losartan potassium, Levaquin [levofloxacin in d5w], Ace inhibitors, Codeine, Cefepime, and Vancomycin   Social History   Tobacco Use  . Smoking status: Former Smoker    Quit date: 06/25/2012    Years since quitting: 7.9  . Smokeless tobacco: Never Used  Vaping Use  . Vaping Use: Never used  Substance Use  Topics  . Alcohol use: Yes    Alcohol/week: 3.0 standard drinks    Types: 3 Standard drinks or equivalent per week    Comment: ADMITS TO DRINKING 3-4 BEERS/DAY  . Drug use: No     Family Hx: The patient's family history includes CAD in her mother.  ROS   EKGs/Labs/Other Test Reviewed:    EKG:  EKG is   ordered today.  The ekg ordered today demonstrates normal sinus rhythm, HR 85, normal axis, no ST-TW changes, QTc 447  Recent Labs: 01/02/2020: ALT 10; BUN 17; Creatinine, Ser 1.17; Potassium 4.8; Sodium 144   Recent Lipid Panel Lab Results  Component Value Date/Time   CHOL 151  01/02/2020 11:00 AM   TRIG 115 01/02/2020 11:00 AM   HDL 66 01/02/2020 11:00 AM   CHOLHDL 2.3 01/02/2020 11:00 AM   CHOLHDL 2.4 10/26/2012 10:52 AM   LDLCALC 65 01/02/2020 11:00 AM   LDLDIRECT 153 (H) 01/30/2008 09:46 PM      Risk Assessment/Calculations:      Physical Exam:    VS:  BP 120/64   Pulse 85   Ht _0  (1.575 m)   Wt 148 lb (67.1 kg)   LMP 12/17/2014   SpO2 96%   BMI 27.07 kg/m     Wt Readings from Last 3 Encounters:  06/03/20 148 lb (67.1 kg)  05/02/20 148 lb (67.1 kg)  04/02/20 153 lb 6.4 oz (69.6 kg)     Constitutional:      Appearance: Healthy appearance. Not in distress.  Neck:     Vascular: JVD normal.  Pulmonary:     Effort: Pulmonary effort is normal.     Breath sounds: No wheezing. No rales.  Cardiovascular:     Normal rate. Regular rhythm. Normal S1. Normal S2.     Murmurs: There is no murmur.  Edema:    Peripheral edema absent.  Abdominal:     Palpations: Abdomen is soft. There is no hepatomegaly.  Skin:    General: Skin is warm and dry.  Neurological:     Mental Status: Alert and oriented to person, place and time.     Cranial Nerves: Cranial nerves are intact.          ASSESSMENT & PLAN:    1. (HFimpeEF) Heart Failure with improved EF Hx of EF 20-25 improved to normal.  Echocardiogram in 3/21 with EF 50-55 and normal GLS.  NYHA I-II.  Volume status stable.  Continue current rx. F/u 1 year.   2. Essential hypertension The patient's blood pressure is controlled on her current regimen.  Continue current therapy.    3. Heavy alcohol use She drinks several alcoholic drinks per day.  We discussed the dangers of excessive ETOH consumption contributing to a cardiomyopathy.  I recommend she cut back/stop drinking alcohol.       Dispo:  Return in about 1 year (around 06/03/2021) for Routine follow up 1 year with Dr. Johnsie Cancel..   Medication Adjustments/Labs and Tests Ordered: Current medicines are reviewed at length with the patient  today.  Concerns regarding medicines are outlined above.  Tests Ordered: Orders Placed This Encounter  Procedures  . EKG 12-Lead   Medication Changes: No orders of the defined types were placed in this encounter.   Signed, Richardson Dopp, PA-C  06/03/2020 3:38 PM    [facility] [address], [place],   [address] Phone: [phone]; Fax: [phone]

## 2020-06-03 ENCOUNTER — Encounter (INDEPENDENT_AMBULATORY_CARE_PROVIDER_SITE_OTHER): Payer: Self-pay

## 2020-06-03 ENCOUNTER — Ambulatory Visit (INDEPENDENT_AMBULATORY_CARE_PROVIDER_SITE_OTHER): Payer: 59 | Admitting: Physician Assistant

## 2020-06-03 ENCOUNTER — Other Ambulatory Visit: Payer: Self-pay

## 2020-06-03 ENCOUNTER — Encounter: Payer: Self-pay | Admitting: Physician Assistant

## 2020-06-03 VITALS — BP 120/64 | HR 85 | Ht 62.0 in | Wt 148.0 lb

## 2020-06-03 DIAGNOSIS — I5032 Chronic diastolic (congestive) heart failure: Secondary | ICD-10-CM

## 2020-06-03 DIAGNOSIS — Z789 Other specified health status: Secondary | ICD-10-CM

## 2020-06-03 DIAGNOSIS — I10 Essential (primary) hypertension: Secondary | ICD-10-CM

## 2020-06-03 DIAGNOSIS — F1090 Alcohol use, unspecified, uncomplicated: Secondary | ICD-10-CM

## 2020-06-03 DIAGNOSIS — E119 Type 2 diabetes mellitus without complications: Secondary | ICD-10-CM

## 2020-06-03 NOTE — Telephone Encounter (Signed)
I already spoke with the Pt 

## 2020-06-03 NOTE — Patient Instructions (Signed)
Medication Instructions:  Your physician recommends that you continue on your current medications as directed. Please refer to the Current Medication list given to you today.  *If you need a refill on your cardiac medications before your next appointment, please call your pharmacy*   Lab Work: None   If you have labs (blood work) drawn today and your tests are completely normal, you will receive your results only by: . MyChart Message (if you have MyChart) OR . A paper copy in the mail If you have any lab test that is abnormal or we need to change your treatment, we will call you to review the results.   Testing/Procedures: None   Follow-Up: At [facility], you and your health needs are our priority.  As part of our continuing mission to provide you with exceptional heart care, we have created designated Provider Care Teams.  These Care Teams include your primary Cardiologist (physician) and Advanced Practice Providers (APPs -  Physician Assistants and Nurse Practitioners) who all work together to provide you with the care you need, when you need it.  We recommend signing up for the patient portal called "MyChart".  Sign up information is provided on this After Visit Summary.  MyChart is used to connect with patients for Virtual Visits (Telemedicine).  Patients are able to view lab/test results, encounter notes, upcoming appointments, etc.  Non-urgent messages can be sent to your provider as well.   To learn more about what you can do with MyChart, go to [email or web address].    Your next appointment:   1 year(s)  The format for your next appointment:   In Person  Provider:   Peter Nishan, MD   Other Instructions Your physician wants you to follow-up in: 1 year with  Dr. Nishan.  You will receive a reminder letter in the mail two months in advance. If you don't receive a letter, please call our office to schedule the follow-up appointment.   

## 2020-06-23 ENCOUNTER — Encounter: Payer: Self-pay | Admitting: Internal Medicine

## 2020-06-23 ENCOUNTER — Ambulatory Visit: Payer: 59 | Attending: Internal Medicine | Admitting: Internal Medicine

## 2020-06-23 ENCOUNTER — Other Ambulatory Visit: Payer: Self-pay | Admitting: *Deleted

## 2020-06-23 ENCOUNTER — Other Ambulatory Visit: Payer: Self-pay

## 2020-06-23 VITALS — BP 118/75 | HR 78 | Resp 16 | Wt 145.6 lb

## 2020-06-23 DIAGNOSIS — K219 Gastro-esophageal reflux disease without esophagitis: Secondary | ICD-10-CM

## 2020-06-23 MED ORDER — PANTOPRAZOLE SODIUM 20 MG PO TBEC: 20.0000 mg | DELAYED_RELEASE_TABLET | Freq: Every day | ORAL | 2 refills | Status: DC

## 2020-06-23 NOTE — Progress Notes (Signed)
Patient ID: Christina Byrd, female    DOB: 1965/12/19  MRN: [id number]  CC: upset stomach   Subjective: Christina Byrd is a 55 y.o. female who presents for UC.  Female friend is with her. Her concerns today include:  3 of HTN, GERD, DM type II, OA of the knee, EtOH abuse, seizure disorder,  Patient presents today stating that she was given omeprazole capsules by Dr. Margarita Rana in place of pantoprazole.  She was not aware of the change.  Furthermore she states she cannot take Stools.  She has continued to take pantoprazole 40 mg twice a day.  The plan was to decrease the dose of the pantoprazole from 40 mg to 20 mg but instead she states omeprazole was sent to the pharmacy.  She wanted to know why it was sent to the pharmacy and that it should have been known that she does not tolerate capsules.   She also has a bottle of magnesium with her today that she purchased over-the-counter.  States that she is supposed to be on magnesium but Dr. Margarita Rana did not send the prescription to the pharmacy.  The pharmacist told her that she can purchase it over-the-counter she has a bottle of the over-the-counter 1 with her.  She states the tablets are too big.   Patient Active Problem List   Diagnosis Date Noted  . Pharyngoesophageal dysphagia 03/28/2020  . Cervicalgia 03/28/2020  . Hot flashes 01/30/2020  . 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  . Hypomagnesemia 03/14/2018  . Type 2 diabetes mellitus ([place]) 09/13/2016  . Depression 07/08/2016  . Seizure ([place]) 07/08/2016  . Cocaine use   . Hot flash, menopausal 06/22/2016  . Intertrigo 06/22/2016  . Gout of left ankle 10/09/2015  . Left hip pain 09/02/2015  . EtOH dependence ([place]) 09/02/2015  . Colonoscopy refused 09/02/2015  . Hyperuricemia 05/27/2015  . 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 Pain and swelling of left ankle 12/02/2014  . Tendinitis of right hip flexor 07/23/2014  . Allergic rhinitis  05/13/2014  . GERD (gastroesophageal reflux disease) 04/01/2014  . Tendonitis, Achilles, right 04/01/2014  . Osteoarthritis of left knee 12/11/2013  . Ex-smoker 11/09/2008  . OBESITY 08/12/2006  . HYPERTENSION, BENIGN ESSENTIAL 08/12/2006     Current Outpatient Medications on File Prior to Visit  Medication Sig Dispense Refill  . amLODipine (NORVASC) 10 MG tablet Take 1 tablet (10 mg total) by mouth daily. 90 tablet 1  . aspirin EC 81 MG tablet Take 1 tablet (81 mg total) by mouth daily.    Marland Kitchen atorvastatin (LIPITOR) 20 MG tablet Take 1 tablet (20 mg total) by mouth daily. 90 tablet 1  . Blood Glucose Monitoring Suppl (TRUE METRIX METER) DEVI 1 kit by Does not apply route 3 (three) times daily. CHECK BLOOD SUGAR UP TO 3 TIMES DAILY. E11.9 1 each 0  . diclofenac (VOLTAREN) 75 MG EC tablet Take 1 tablet (75 mg total) by mouth 2 (two) times daily. 60 tablet 1  . glucose blood (TRUE METRIX BLOOD GLUCOSE TEST) test strip Use as instructed 100 each 12  . levETIRAcetam (KEPPRA) 500 MG tablet Take 1 tablet (500 mg total) by mouth 2 (two) times daily. 60 tablet 11  . magnesium oxide (MAG-OX) 400 MG tablet Take 1 tablet (400 mg total) by mouth daily. 30 tablet 3  . pantoprazole (PROTONIX) 40 MG tablet Take 1 tablet by mouth 2 (two) times daily.    Marland Kitchen tiZANidine (ZANAFLEX)  4 MG tablet Take 1 tablet (4 mg total) by mouth every 8 (eight) hours as needed for muscle spasms. 60 tablet 1  . TRUEplus Lancets 28G MISC SMARTSIG:Topical 1 to 3 Times Daily 100 each 3  . [DISCONTINUED] metFORMIN (GLUCOPHAGE) 500 MG tablet Take 0.5 tablets (250 mg total) by mouth daily with breakfast. 45 tablet 6   No current facility-administered medications on file prior to visit.    Allergies  Allergen Reactions  . Losartan Potassium     Rash  . Levaquin [Levofloxacin In D5w] Rash  . Ace Inhibitors Cough    REACTION: cough  . Codeine Nausea Only  . Cefepime Rash    09/04/12 pm Patient started to break out with small  macules after IV Vanco infusion, then macules increased in size after starting cefepime infusion.  . Vancomycin Rash    09/04/12 pm Patient started to break out with small macules after IV Vanco infusion, then macules increased in size after starting cefepime infusion.    Social History   Socioeconomic History  . Marital status: Single    Spouse name: Not on file  . Number of children: Not on file  . Years of education: Not on file  . Highest education level: Not on file  Occupational History  . Not on file  Tobacco Use  . Smoking status: Former Smoker    Quit date: 06/25/2012    Years since quitting: 8.0  . Smokeless tobacco: Never Used  Vaping Use  . Vaping Use: Never used  Substance and Sexual Activity  . Alcohol use: Yes    Alcohol/week: 3.0 standard drinks    Types: 3 Standard drinks or equivalent per week    Comment: ADMITS TO DRINKING 3-4 BEERS/DAY  . Drug use: No  . Sexual activity: Not Currently  Other Topics Concern  . Not on file  Social History Narrative   Pt lives in single story home with her partner   Has 1 son   5 grandchildren   10th grade education   Works at [employer]    Right handed   Social Determinants of Health   Financial Resource Strain: Not on file  Food Insecurity: Not on file  Transportation Needs: Not on file  Physical Activity: Not on file  Stress: Not on file  Social Connections: Not on file  Intimate Partner Violence: Not on file    Family History  Problem Relation Age of Onset  . CAD Mother     No past surgical history on file.  ROS: Review of Systems Negative except as stated above  PHYSICAL EXAM: BP 118/75   Pulse 78   Resp 16   Wt 145 lb 9.6 oz (66 kg)   LMP 12/17/2014   SpO2 100%   BMI 26.63 kg/m   Physical Exam  General appearance - alert, well appearing, and in no distress Patient is a little bit difficult to understand when she speaks and talks in tangents.  CMP Latest Ref Rng & Units 01/02/2020  04/24/2019 10/04/2018  Glucose 65 - 99 mg/dL 83 98 86  BUN 6 - 24 mg/dL _0 Creatinine 0.57 - 1.00 mg/dL 1.17(H) 0.92 0.95  Sodium 134 - 144 mmol/L 144 143 142  Potassium 3.5 - 5.2 mmol/L 4.8 4.2 4.2  Chloride 96 - 106 mmol/L 104 102 103  CO2 20 - 29 mmol/L _1 Calcium 8.7 - 10.2 mg/dL 9.6 9.4 8.3(L)  Total Protein 6.0 - 8.5 g/dL 7.1 - -  Total Bilirubin 0.0 - 1.2 mg/dL 0.4 - -  Alkaline Phos 44 - 121 IU/L 126(H) - -  AST 0 - 40 IU/L 14 - -  ALT 0 - 32 IU/L 10 - -   Lipid Panel     Component Value Date/Time   CHOL 151 01/02/2020 1100   TRIG 115 01/02/2020 1100   HDL 66 01/02/2020 1100   CHOLHDL 2.3 01/02/2020 1100   CHOLHDL 2.4 10/26/2012 1052   VLDL 46 (H) 10/26/2012 1052   LDLCALC 65 01/02/2020 1100   LDLDIRECT 153 (H) 01/30/2008 2146    CBC    Component Value Date/Time   WBC 7.4 04/24/2019 1459   WBC 8.7 11/10/2017 1144   RBC 4.13 04/24/2019 1459   RBC 4.36 11/10/2017 1144   HGB 12.9 04/24/2019 1459   HCT 38.5 04/24/2019 1459   PLT 264 04/24/2019 1459   MCV 93 04/24/2019 1459   MCH 31.2 04/24/2019 1459   MCH 29.4 11/10/2017 1144   MCHC 33.5 04/24/2019 1459   MCHC 32.3 11/10/2017 1144   RDW 14.8 04/24/2019 1459   LYMPHSABS 2.1 04/24/2019 1459   MONOABS 0.5 11/10/2017 1144   EOSABS 0.1 04/24/2019 1459   BASOSABS 0.0 04/24/2019 1459    ASSESSMENT AND PLAN: 1. Gastroesophageal reflux disease without esophagitis I informed patient that I am not sure why the pantoprazole was changed to omeprazole.  It may be that her insurance no longer covers for the latter.  However she feels this is not the case and furthermore she cannot take capsules.  I told her that she can finish the current bottle of pantoprazole and I will send a new prescription decreasing the dose as was intended to 20 mg tablets daily. In regards to the magnesium, I told patient that it may be that her insurance does not cover for the magnesium because it is sold over-the-counter.  However I did  make her aware that Dr. Margarita Rana did indeed send a prescription to the pharmacy for magnesium with refills on it.  Advised that she check with the pharmacy and inquire whether she can get the prescription filled there or whether her insurance does not cover for it and has to be purchased OTC. - pantoprazole (PROTONIX) 20 MG tablet; Take 1 tablet (20 mg total) by mouth daily.  Dispense: 30 tablet; Refill: 2   Patient was given the opportunity to ask questions.  Patient verbalized understanding of the plan and was able to repeat key elements of the plan.   No orders of the defined types were placed in this encounter.    Requested Prescriptions    No prescriptions requested or ordered in this encounter    No follow-ups on file.  Karle Plumber, MD, FACP

## 2020-06-23 NOTE — Patient Outreach (Signed)
[facility]) Care Management  06/23/2020  Christina Byrd 1965/02/27 [id number]   Care Coordination Received a referral for dental care  RN contacted [employer] (insurance carrier) and inquired on possible resources or if there care navigation team could assist pt further. Representative indicated [employer] customer services could assist and provided a contact number for the pt.  RN spoke with pt and explained [facility] services for care management. Pt was just requesting dental care. RN provided the contact number for [facility] customer services and discussed the care navigator available with her insurance company. Pt very appreciated for this information. RN also inquired on any other medical issues that pt may need assistance with at this time. Pt states she no long has "sugar" related to diabetes. No other issues to address for case management services.  Case will be closed with no other assistance needed at this time.   Elliot Cousin, RN Care Management Coordinator [facility] Main Office [phone]

## 2020-06-23 NOTE — Progress Notes (Signed)
Pt states the medicine is making her sick  Pt states she has a bottle of omeprazole and she has never taking it before. Pt states she is suppose to be taking Protonix

## 2020-06-24 ENCOUNTER — Telehealth: Payer: Self-pay | Admitting: Family Medicine

## 2020-06-24 NOTE — Telephone Encounter (Signed)
Routing to CMA who saw her

## 2020-06-24 NOTE — Telephone Encounter (Signed)
Copied from CRM [id number]. Topic: General - Other >> Jun 23, 2020  4:59 PM Pawlus, Maxine Glenn A wrote: Reason for CRM: Pt needed some clarification on her medications, pt was not sure if she is supposed to take pantoprazole (PROTONIX) 20 MG tablet along with her other medications. Please call back.

## 2020-06-25 NOTE — Telephone Encounter (Signed)
Pt states this concerned was taking care of when she seen Dr. Laural Benes. Pt doesn't have any questions or concerns

## 2020-06-26 ENCOUNTER — Ambulatory Visit: Payer: 59 | Attending: Physician Assistant | Admitting: Physician Assistant

## 2020-06-26 ENCOUNTER — Telehealth: Payer: Self-pay | Admitting: Neurology

## 2020-06-26 ENCOUNTER — Other Ambulatory Visit: Payer: Self-pay

## 2020-06-26 NOTE — Progress Notes (Deleted)
Patient ID: Christina Byrd, female   DOB: 06/03/1965, 55 y.o.   MRN: [id number]

## 2020-06-26 NOTE — Telephone Encounter (Signed)
Pt was seen in May 02, 2020 she stated that her PCP gave her meds that made her not be able to walk for 4 days she said that they stopped the meds but now her body jumps and she needs to be seen by Dr Karel Jarvis

## 2020-06-30 NOTE — Telephone Encounter (Signed)
Ok for May 20 at 11:30am slot, thanks

## 2020-07-01 ENCOUNTER — Ambulatory Visit: Payer: 59 | Admitting: Internal Medicine

## 2020-07-01 ENCOUNTER — Telehealth: Payer: Self-pay | Admitting: Family Medicine

## 2020-07-01 NOTE — Telephone Encounter (Signed)
Call placed to patient, per provider patient needs to see PCP to discuss issue. Call placed to patient, connected with her, and attempted to inform her that her appointment for today would need to be rescheduled with PCP. Patient became upset and stated that we are trying to kill her by being unwilling to see her a change her medication. I advised patient that she needs to see Dr. Alvis Lemmings to address the concerns since she recently saw [facility] 5/2. Patient stated I was lying about her seeing Dr. Laural Benes on the 2nd and stated that the last 2 times she has come up here, everyone has refused to see her. Patient stated she was going to contact a lawyer regarding this issue. I offered to reschedule her appointment with PCP on June 13th and patient declined stating that was entirely too long to wait. I apologized for out office giving her the run around. Patient stated she would like to pick up a printed list of her medications and that she would take it from there. Patient states she is trying to be nice but is upset because she wants her medication issue straightened out. I advised her that she could pick up a medication list today.

## 2020-07-03 ENCOUNTER — Telehealth: Payer: Self-pay | Admitting: Family Medicine

## 2020-07-03 NOTE — Telephone Encounter (Signed)
Copied from CRM [id number]. Topic: General - Other >> Jul 02, 2020  3:01 PM Areatha Keas wrote: Reason for CRM: Pt called again about her med list/ she will come in this afternoon to pick up a copy of the meds she is taking/ please advise   Patient med list has been printed and ready for pickup since date requested 07/01/2020.

## 2020-07-11 ENCOUNTER — Other Ambulatory Visit: Payer: Self-pay

## 2020-07-11 ENCOUNTER — Ambulatory Visit (INDEPENDENT_AMBULATORY_CARE_PROVIDER_SITE_OTHER): Payer: 59 | Admitting: Neurology

## 2020-07-11 ENCOUNTER — Encounter: Payer: Self-pay | Admitting: Neurology

## 2020-07-11 VITALS — BP 104/71 | HR 89 | Ht 62.0 in | Wt 147.0 lb

## 2020-07-11 DIAGNOSIS — G5603 Carpal tunnel syndrome, bilateral upper limbs: Secondary | ICD-10-CM

## 2020-07-11 DIAGNOSIS — G40209 Localization-related (focal) (partial) symptomatic epilepsy and epileptic syndromes with complex partial seizures, not intractable, without status epilepticus: Secondary | ICD-10-CM | POA: Diagnosis not present

## 2020-07-11 DIAGNOSIS — R202 Paresthesia of skin: Secondary | ICD-10-CM | POA: Diagnosis not present

## 2020-07-11 NOTE — Progress Notes (Signed)
NEUROLOGY FOLLOW UP OFFICE NOTE  Christina Byrd [id number] 03-14-1965  HISTORY OF PRESENT ILLNESS: I had the pleasure of seeing Christina Byrd in follow-up in the neurology clinic on 07/11/2020.  The patient was last seen 2 months ago for seizures suggestive of temporal lobe epilepsy. She is again accompanied by her significant other who helps supplement the history today.  Records and images were personally reviewed where available. She has been seizure-free since May 2020 on Levetiracetam 540m BID. She presents for an earlier visit due to new symptoms. She called our office today report that she was prescribed Febuxostat in December 2021 which made her not able to walk for 4 days. She stopped the medication but now her body jumps. She did not report these symptoms on her visit 2 months ago, she mostly reported occasional left shoulder muscle spasms when lifting a lot. When asked about these symptoms, she reports symptoms over the left shoulder, it feels like someone grabs and squeezer her shoulder. She also describes times where she would be holding something in her left hand and would drop things. She has paresthesias in her hands, like sticking with a lot of pins. She wakes up with with both hands so tight and she tries to wake them up. She has occasional neck pain. She had an NCV in 12/2019 with mild bilateral carpal tunnel syndrome noted. She is unsure about her allopurinol dose, she has a bottle for 1021mand 30080mablets and does not know which one to take.    History on Initial Assessment 11/18/2017: This is a 55 3ar old right-handed woman with a history of alcohol abuse, diabetes, hypertension, presenting for evaluation of recurrent seizures. The first seizure occurred in May 2018, she had a seizure in her sleep, then had another one before EMS arrived. Her significant other states she was in a daze and not responding in between seizures, then had another en route to the hospital. Per ER  notes, she had 4 seizures that day. No focal weakness. Her CK was elevated, UDS positive for cocaine. 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 EEG normal. 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 She was seizure-free for almost a year until July 2019 when she had another convulsion. She denies any prior warning symptoms, waking up in the ambulance. At that time, her potassium and magnesium levels were very low. UDS negative. She was discharged home on Keppra but had not been taking it because she would have nausea and vomiting taking it together with her other medications. She had a third seizure on 11/10/2017, her significant other reports that this also occurred in sleep, she had heavy breathing with urinary incontinence. She would be confused after, no focal weakness. She was discharged home on Keppra 500m67mD which she is tolerating without side effects now that she is taking medications separately. She has occasional episodes where she smells an odd/terrible smell like blood or trash. Her significant other denies any staring/unresponsive episodes except in between the seizures. She denies any gaps in time, rising epigastric sensation, focal numbness/tingling/weakness, deja vu sensations, or myoclonic jerks. She has a history of heavy alcohol use, she usually drinks 2 beers and 4 shots of liquor every night, at most 3 12-oz beers and 6 shots of liquor. She recalls that the night prior to the seizures she had more alcohol and was up until 3am. She denies any headaches, dizziness,  vision changes, neck/back pain, bowel/bladder dysfunction except for occasional constipation. Her father had seizures due to alcohol. Otherwise she had a normal birth and early development.  There is no history of febrile convulsions, CNS infections such as meningitis/encephalitis, significant traumatic brain injury, neurosurgical procedures.  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 EEG in 2018 was normal.  PAST MEDICAL HISTORY: Past Medical History:  Diagnosis Date  . GASTROESOPHAGEAL REFLUX, NO ESOPHAGITIS 04/21/2006   Qualifier: Diagnosis of  By: WATT, JOANNE    . GERD (gastroesophageal reflux disease) Dx 1995  . HTN (hypertension)     MEDICATIONS: Current Outpatient Medications on File Prior to Visit  Medication Sig Dispense Refill  . amLODipine (NORVASC) 10 MG tablet Take 1 tablet (10 mg total) by mouth daily. 90 tablet 1  . aspirin EC 81 MG tablet Take 1 tablet (81 mg total) by mouth daily.    . atorvastatin (LIPITOR) 20 MG tablet Take 1 tablet (20 mg total) by mouth daily. 90 tablet 1  . Blood Glucose Monitoring Suppl (TRUE METRIX METER) DEVI 1 kit by Does not apply route 3 (three) times daily. CHECK BLOOD SUGAR UP TO 3 TIMES DAILY. E11.9 1 each 0  . diclofenac (VOLTAREN) 75 MG EC tablet Take 1 tablet (75 mg total) by mouth 2 (two) times daily. 60 tablet 1  . glucose blood (TRUE METRIX BLOOD GLUCOSE TEST) test strip Use as instructed 100 each 12  . levETIRAcetam (KEPPRA) 500 MG tablet Take 1 tablet (500 mg total) by mouth 2 (two) times daily. 60 tablet 11  . magnesium oxide (MAG-OX) 400 MG tablet Take 1 tablet (400 mg total) by mouth daily. 30 tablet 3  . pantoprazole (PROTONIX) 20 MG tablet Take 1 tablet (20 mg total) by mouth daily. 30 tablet 2  . tiZANidine (ZANAFLEX) 4 MG tablet Take 1 tablet (4 mg total) by mouth every 8 (eight) hours as needed for muscle spasms. 60 tablet 1  . TRUEplus Lancets 28G MISC SMARTSIG:Topical 1 to 3 Times Daily 100 each 3  . [DISCONTINUED] metFORMIN (GLUCOPHAGE) 500 MG tablet Take 0.5 tablets (250 mg total) by mouth daily with breakfast. 45 tablet 6   No current facility-administered medications on file prior to visit.    ALLERGIES: Allergies  Allergen Reactions  . Losartan Potassium     Rash  .  Levaquin [Levofloxacin In D5w] Rash  . Ace Inhibitors Cough    REACTION: cough  . Codeine Nausea Only  . Cefepime Rash    09/04/12 pm Patient started to break out with small macules after IV Vanco infusion, then macules increased in size after starting cefepime infusion.  . Vancomycin Rash    09/04/12 pm Patient started to break out with small macules after IV Vanco infusion, then macules increased in size after starting cefepime infusion.    FAMILY HISTORY: Family History  Problem Relation Age of Onset  . CAD Mother     SOCIAL HISTORY: Social History   Socioeconomic History  . Marital status: Single    Spouse name: Not on file  . Number of children: Not on file  . Years of education: Not on file  . Highest education level: Not on file  Occupational History  . Not on file  Tobacco Use  . Smoking status: Former Smoker    Quit date: 06/25/2012    Years since quitting: 8.0  . Smokeless tobacco: Never Used    Vaping Use  . Vaping Use: Never used  Substance and Sexual Activity  . Alcohol use: Yes    Alcohol/week: 3.0 standard drinks    Types: 3 Standard drinks or equivalent per week    Comment: ADMITS TO DRINKING 3-4 BEERS/DAY  . Drug use: No  . Sexual activity: Not Currently  Other Topics Concern  . Not on file  Social History Narrative   Pt lives in single story home with her partner   Has 1 son   5 grandchildren   10th grade education   Works at [employer].    Right handed   Social Determinants of Health   Financial Resource Strain: Not on file  Food Insecurity: Not on file  Transportation Needs: Not on file  Physical Activity: Not on file  Stress: Not on file  Social Connections: Not on file  Intimate Partner Violence: Not on file     PHYSICAL EXAM: Vitals:   07/11/20 1150  BP: 104/71  Pulse: 89  SpO2: 99%   General: No acute distress Head:  Normocephalic/atraumatic Skin/Extremities: No rash, no edema Neurological Exam: alert and awake. No aphasia  or dysarthria. Fund of knowledge is appropriate.  Attention and concentration are reduced.   Cranial nerves: Pupils equal, round. Extraocular movements intact with no nystagmus. Visual fields full.  No facial asymmetry.  Motor: Bulk and tone normal, muscle strength 5/5 throughout except for pain with left shoulder abduction. Finger to nose testing intact.  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 She has been seizure-free since May 2020 on Levetiracetam 500mg BID. She presents for an earlier visit reporting body jerks since taking a gout medication in December. She has stopped medication, and on further questioning reports the symptoms are spasms on the left shoulder, she has limited left shoulder abduction, reporting pain. Discussed that this is unrelated to the gout medication that she has stopped and that she should follow-up with Ortho for left shoulder. Discussed paresthesias due to bilateral carpal tunnel advised to use wrist splint. Neuropathy labs will be ordered, discussed how alcohol can also affect nerve function. Discuss medication concerns with PCP. Follow-up as scheduled in March 2023, she knows to call for any changes.    Thank you for allowing me to participate in her care.  Please do not hesitate to call for any questions or concerns.    , M.D.   CC: Dr. Newlin      

## 2020-07-11 NOTE — Patient Instructions (Addendum)
1. Bloodwork for TSH, B12, B1, folic acid  2. Start using wrist brace on both wrists to help with the pinched nerves at the wrist  3. Call [phone] to schedule appointment with Ortho for your left shoulder  4. Follow-up and discuss your medications with your family doctor  5. Continue Keppra 500mg  twice a day   6. Follow-up as scheduled in March 2023

## 2020-07-14 ENCOUNTER — Other Ambulatory Visit: Payer: Self-pay | Admitting: Physician Assistant

## 2020-07-14 NOTE — Telephone Encounter (Signed)
Requested medication (s) are due for refill today: ?  Requested medication (s) are on the active medication list: no  Last refill:  ?  Future visit scheduled: Yes  Notes to clinic:  Medication not on list.    Requested Prescriptions  Pending Prescriptions Disp Refills   cloNIDine (CATAPRES) 0.1 MG tablet [Pharmacy Med Name: CLONIDINE HCL 0.1 MG TABLET] 30 tablet 1    Sig: TAKE 1 TABLET AT BEDTIME AS NEEDED FOR HOT FLASHES      Cardiovascular:  Alpha-2 Agonists Passed - 07/14/2020  1:36 PM      Passed - Last BP in normal range    BP Readings from Last 1 Encounters:  07/11/20 104/71          Passed - Last Heart Rate in normal range    Pulse Readings from Last 1 Encounters:  07/11/20 89          Passed - Valid encounter within last 6 months    Recent Outpatient Visits           2 weeks ago    [facility] [place], [place], [place]   3 weeks ago Gastroesophageal reflux disease without esophagitis   [facility] [place], Gavin Pound B, [place]   3 months ago Controlled type 2 diabetes mellitus without complication, without long-term current use of insulin (HCC)   [facility] [place], Odette Horns, [place]   3 months ago Gastroesophageal reflux disease, unspecified whether esophagitis present   [facility] Flo Shanks, [place]   4 months ago Seizure [facility])   [facility] [place], Marzella Schlein, [place]       Future Appointments             In 1 month [place], Marzella Schlein, PA-C [facility]

## 2020-07-16 ENCOUNTER — Other Ambulatory Visit (INDEPENDENT_AMBULATORY_CARE_PROVIDER_SITE_OTHER): Payer: 59

## 2020-07-16 ENCOUNTER — Other Ambulatory Visit: Payer: Self-pay

## 2020-07-16 DIAGNOSIS — G5603 Carpal tunnel syndrome, bilateral upper limbs: Secondary | ICD-10-CM

## 2020-07-16 DIAGNOSIS — R202 Paresthesia of skin: Secondary | ICD-10-CM | POA: Diagnosis not present

## 2020-07-17 LAB — FOLATE: Folate: 2.7 ng/mL — ABNORMAL LOW (ref >5.9)

## 2020-07-17 LAB — VITAMIN B12: Vitamin B-12: 198 pg/mL — ABNORMAL LOW (ref 211–911)

## 2020-07-17 LAB — TSH: TSH: 1.58 u[IU]/mL (ref 0.35–4.50)

## 2020-07-18 ENCOUNTER — Other Ambulatory Visit: Payer: Self-pay | Admitting: Family Medicine

## 2020-07-20 LAB — VITAMIN B1: Vitamin B1 (Thiamine): <6 nmol/L — ABNORMAL LOW (ref 8–30)

## 2020-07-22 ENCOUNTER — Other Ambulatory Visit: Payer: Self-pay

## 2020-07-22 MED ORDER — VITAMIN B-1 100 MG PO TABS
100.0000 mg | ORAL_TABLET | Freq: Every day | ORAL | 11 refills | Status: DC
  Filled 2020-07-22: qty 30, 30d supply, fill #0

## 2020-07-22 MED ORDER — VITAMIN B-12 1000 MCG PO TABS
1000.0000 ug | ORAL_TABLET | Freq: Every day | ORAL | 11 refills | Status: DC
  Filled 2020-07-22: qty 30, 30d supply, fill #0

## 2020-07-22 MED ORDER — FOLIC ACID 1 MG PO TABS
1.0000 mg | ORAL_TABLET | Freq: Every day | ORAL | 11 refills | Status: DC
  Filled 2020-07-22: qty 30, 30d supply, fill #0

## 2020-07-23 ENCOUNTER — Telehealth: Payer: Self-pay

## 2020-07-23 NOTE — Telephone Encounter (Signed)
Pt called and informed that B vitamin levels are quite low. Recommend starting vitamin B12 daily supplement, vitamin B1 100mg  daily, and folic acid 1mg  daily. She can get these over the counter, but I will send in Rx. Pls let her know that alcohol intake can also lower these in her body and affect the nerves, continue with reducing alcohol as well.  Scripts sent to the pharmacy

## 2020-07-23 NOTE — Telephone Encounter (Signed)
-----   Message from Van Clines, MD sent at 07/22/2020  4:09 PM EDT ----- Pls let her know that her B vitamin levels are quite low. Recommend starting vitamin B12 daily supplement, vitamin B1 100mg  daily, and folic acid 1mg  daily. She can get these over the counter, but I will send in Rx. Pls let her know that alcohol intake can also lower these in her body and affect the nerves, continue with reducing alcohol as well. Thanks

## 2020-07-28 ENCOUNTER — Other Ambulatory Visit: Payer: Self-pay

## 2020-07-28 ENCOUNTER — Telehealth: Payer: Self-pay | Admitting: Neurology

## 2020-07-28 MED ORDER — FOLIC ACID 1 MG PO TABS: 1.0000 mg | ORAL_TABLET | Freq: Every day | ORAL | 11 refills | Status: DC

## 2020-07-28 MED ORDER — VITAMIN B-12 1000 MCG PO TABS: 1000.0000 ug | ORAL_TABLET | Freq: Every day | ORAL | 11 refills | Status: DC

## 2020-07-28 MED ORDER — VITAMIN B-1 100 MG PO TABS: 100.0000 mg | ORAL_TABLET | Freq: Every day | ORAL | 11 refills | Status: DC

## 2020-07-28 NOTE — Telephone Encounter (Signed)
Patient called and requested her prescriptions from Dr. Karel Jarvis for supplements be sent to the pharmacy below instead.  [facility] on [address] and [address]

## 2020-07-28 NOTE — Telephone Encounter (Signed)
Scripts sent to new pharmacy.

## 2020-07-29 ENCOUNTER — Other Ambulatory Visit: Payer: Self-pay

## 2020-07-29 ENCOUNTER — Other Ambulatory Visit: Payer: Self-pay | Admitting: Family Medicine

## 2020-07-29 NOTE — Telephone Encounter (Signed)
Requesting a 90 day supply   Requested Prescriptions  Pending Prescriptions Disp Refills   cloNIDine (CATAPRES) 0.1 MG tablet [Pharmacy Med Name: CLONIDINE HCL 0.1 MG TABLET] 90 tablet 1    Sig: TAKE 1 TABLET AT BEDTIME AS NEEDED FOR HOT FLASHES      Cardiovascular:  Alpha-2 Agonists Passed - 07/29/2020  9:00 AM      Passed - Last BP in normal range    BP Readings from Last 1 Encounters:  07/11/20 104/71          Passed - Last Heart Rate in normal range    Pulse Readings from Last 1 Encounters:  07/11/20 89          Passed - Valid encounter within last 6 months    Recent Outpatient Visits           1 month ago    [facility] [place], [place], [place]   1 month ago Gastroesophageal reflux disease without esophagitis   [facility] [place], Gavin Pound B, [place]   3 months ago Controlled type 2 diabetes mellitus without complication, without long-term current use of insulin (HCC)   [facility] [place], Odette Horns, [place]   4 months ago Gastroesophageal reflux disease, unspecified whether esophagitis present   [facility] Flo Shanks, [place]   4 months ago Seizure [facility])   [facility] [place], Marzella Schlein, [place]       Future Appointments             In 2 weeks Sharon Seller, Marzella Schlein, PA-C [facility]

## 2020-07-30 ENCOUNTER — Telehealth: Payer: Self-pay | Admitting: Family Medicine

## 2020-07-30 NOTE — Telephone Encounter (Unsigned)
Copied from CRM [id number]. Topic: General - Other >> Jul 29, 2020  2:57 PM Christina Byrd wrote: Reason for CRM: Patient would like the nurse to call her regarding her medication dosage.  Please call to explain at [phone]

## 2020-07-30 NOTE — Telephone Encounter (Signed)
Pt was called and all questions were answered. 

## 2020-08-14 ENCOUNTER — Other Ambulatory Visit: Payer: Self-pay

## 2020-08-14 ENCOUNTER — Ambulatory Visit: Payer: Self-pay | Attending: Physician Assistant | Admitting: Physician Assistant

## 2020-08-14 VITALS — BP 107/69 | HR 84 | Ht 62.0 in | Wt 148.0 lb

## 2020-08-14 DIAGNOSIS — K219 Gastro-esophageal reflux disease without esophagitis: Secondary | ICD-10-CM

## 2020-08-14 DIAGNOSIS — E119 Type 2 diabetes mellitus without complications: Secondary | ICD-10-CM

## 2020-08-14 DIAGNOSIS — Z7189 Other specified counseling: Secondary | ICD-10-CM

## 2020-08-14 DIAGNOSIS — I10 Essential (primary) hypertension: Secondary | ICD-10-CM

## 2020-08-14 NOTE — Progress Notes (Signed)
Patient ID: Christina Byrd, female   DOB: Jun 10, 1965, 55 y.o.   MRN: [id number]   Christina Byrd, is a 55 y.o. female  WLN:[id number]  DEY:[id number]  DOB - 13-Feb-1966  Subjective:  Chief Complaint and HPI: Christina Byrd is a 55 y.o. female here today to establish care and for a follow up visit.   ED/Hospital notes reviewed.   Social History: Family history:  ROS:   Constitutional:  No f/c, No night sweats, No unexplained weight loss. EENT:  No vision changes, No blurry vision, No hearing changes. No mouth, throat, or ear problems.  Respiratory: No cough, No SOB Cardiac: No CP, no palpitations GI:  No abd pain, No N/V/D. GU: No Urinary s/sx Musculoskeletal: No joint pain Neuro: No headache, no dizziness, no motor weakness.  Skin: No rash Endocrine:  No polydipsia. No polyuria.  Psych: Denies SI/HI  No problems updated.  ALLERGIES: Allergies  Allergen Reactions   Losartan Potassium     Rash   Levaquin [Levofloxacin In D5w] Rash   Ace Inhibitors Cough    REACTION: cough   Codeine Nausea Only   Cefepime Rash    09/04/12 pm Patient started to break out with small macules after IV Vanco infusion, then macules increased in size after starting cefepime infusion.   Vancomycin Rash    09/04/12 pm Patient started to break out with small macules after IV Vanco infusion, then macules increased in size after starting cefepime infusion.    PAST MEDICAL HISTORY: Past Medical History:  Diagnosis Date   GASTROESOPHAGEAL REFLUX, NO ESOPHAGITIS 04/21/2006   Qualifier: Diagnosis of  By: Benna Dunks     GERD (gastroesophageal reflux disease) Dx 1995   HTN (hypertension)     MEDICATIONS AT HOME: Prior to Admission medications   Medication Sig Start Date End Date Taking? Authorizing Provider  amLODipine (NORVASC) 10 MG tablet Take 1 tablet (10 mg total) by mouth daily. 05/19/20  Yes Charlott Rakes, MD  aspirin EC 81 MG tablet Take 1 tablet (81 mg total) by mouth daily. 10/26/12   Yes Rai, Ripudeep K, MD  atorvastatin (LIPITOR) 20 MG tablet Take 1 tablet (20 mg total) by mouth daily. 05/19/20  Yes Charlott Rakes, MD  Blood Glucose Monitoring Suppl (TRUE METRIX METER) DEVI 1 kit by Does not apply route 3 (three) times daily. CHECK BLOOD SUGAR UP TO 3 TIMES DAILY. E11.9 02/27/19  Yes Newlin, Charlane Ferretti, MD  cloNIDine (CATAPRES) 0.1 MG tablet TAKE 1 TABLET AT BEDTIME AS NEEDED FOR HOT FLASHES 07/29/20  Yes Newlin, Enobong, MD  diclofenac (VOLTAREN) 75 MG EC tablet TAKE 1 TABLET(75 MG) BY MOUTH TWICE DAILY 07/18/20  Yes Charlott Rakes, MD  folic acid (FOLVITE) 1 MG tablet Take 1 tablet (1 mg total) by mouth daily. 07/28/20  Yes Cameron Sprang, MD  glucose blood (TRUE METRIX BLOOD GLUCOSE TEST) test strip Use as instructed 03/06/20  Yes Charlott Rakes, MD  levETIRAcetam (KEPPRA) 500 MG tablet Take 1 tablet (500 mg total) by mouth 2 (two) times daily. 05/19/20  Yes Charlott Rakes, MD  magnesium oxide (MAG-OX) 400 MG tablet Take 1 tablet (400 mg total) by mouth daily. 05/19/20  Yes Charlott Rakes, MD  pantoprazole (PROTONIX) 20 MG tablet Take 1 tablet (20 mg total) by mouth daily. 06/23/20  Yes Ladell Pier, MD  thiamine (VITAMIN B-1) 100 MG tablet Take 1 tablet (100 mg total) by mouth daily. 07/28/20  Yes Cameron Sprang, MD  TRUEplus Lancets 28G MISC SMARTSIG:Topical 1 to 3 Times  Daily 03/06/20  Yes Charlott Rakes, MD  vitamin B-12 (CYANOCOBALAMIN) 1000 MCG tablet Take 1 tablet (1,000 mcg total) by mouth daily. 07/28/20  Yes Cameron Sprang, MD  metFORMIN (GLUCOPHAGE) 500 MG tablet Take 0.5 tablets (250 mg total) by mouth daily with breakfast. 03/05/20 04/02/20  Argentina Donovan, PA-C     Objective:  EXAM:   Vitals:   08/14/20 1500  BP: 107/69  Pulse: 84  SpO2: 100%  Weight: 148 lb (67.1 kg)  Height: 5\' 2"  (1.575 m)    General appearance : A&OX3. NAD. Non-toxic-appearing HEENT: Atraumatic and Normocephalic.  PERRLA. EOM intact.  TM clear B. Mouth-MMM, post pharynx WNL w/o  erythema, No PND. Neck: supple, no JVD. No cervical lymphadenopathy. No thyromegaly Chest/Lungs:  Breathing-non-labored, Good air entry bilaterally, breath sounds normal without rales, rhonchi, or wheezing  CVS: S1 S2 regular, no murmurs, gallops, rubs  Abdomen: Bowel sounds present, Non tender and not distended with no gaurding, rigidity or rebound. Extremities: Bilateral Lower Ext shows no edema, both legs are warm to touch with = pulse throughout Neurology:  CN II-XII grossly intact, Non focal.   Psych:  TP linear. J/I WNL. Normal speech. Appropriate eye contact and affect.  Skin:  No Rash  Data Review Lab Results  Component Value Date   HGBA1C 5.2 04/02/2020   HGBA1C 5.1 11/13/2019   HGBA1C 5.3 04/24/2019     Assessment & Plan   1. Gastroesophageal reflux disease without esophagitis Continue omeprazole as needed  2. Controlled type 2 diabetes mellitus without complication, without long-term current use of insulin ([place]) I have had a lengthy discussion and provided education about insulin resistance and the intake of too much sugar/refined carbohydrates.  I have advised the patient to work at a goal of eliminating sugary drinks, candy, desserts, sweets, refined sugars, processed foods, and white carbohydrates.  The patient expresses understanding.    3. HYPERTENSION, BENIGN ESSENTIAL Controlled-continue amlodipine 10mg  daily  4. Encounter for medication counseling Reviewed and took meds off she is not taking(allopurinol and tizanidine)  Continue keppra bid.  Clonidine as needed for hot flashes.  Diclofenac as needed for pain   Patient have been counseled extensively about nutrition and exercise  Return in about 3 months (around 11/14/2020) for PCP; chronic conditions.  The patient was given clear instructions to go to ER or return to medical center if symptoms don't improve, worsen or new problems develop. The patient verbalized understanding. The patient was told to call to  get lab results if they haven't heard anything in the next week.     Freeman Caldron, PA-C [facility] [place], [place]   08/14/2020, 3:22 PM

## 2020-08-26 ENCOUNTER — Other Ambulatory Visit: Payer: Self-pay | Admitting: Family Medicine

## 2020-08-26 DIAGNOSIS — I10 Essential (primary) hypertension: Secondary | ICD-10-CM

## 2020-10-06 ENCOUNTER — Telehealth: Payer: Self-pay | Admitting: Family Medicine

## 2020-10-06 DIAGNOSIS — K219 Gastro-esophageal reflux disease without esophagitis: Secondary | ICD-10-CM

## 2020-10-06 NOTE — Telephone Encounter (Signed)
Copied from CRM [id number]. Topic: General - Inquiry >> Oct 06, 2020  1:51 PM Traci Sermon wrote: Reason for CRM: Pt called in about pantoprazole (PROTONIX) 20 MG tablet and having them changed to the 30mg , pt states she had already been speaking with someone about this change. Please advise.

## 2020-10-07 ENCOUNTER — Other Ambulatory Visit: Payer: Self-pay

## 2020-10-07 MED ORDER — PANTOPRAZOLE SODIUM 40 MG PO TBEC
40.0000 mg | DELAYED_RELEASE_TABLET | Freq: Every day | ORAL | 3 refills | Status: DC
  Filled 2020-10-07: qty 30, 30d supply, fill #0

## 2020-10-07 NOTE — Telephone Encounter (Signed)
Done

## 2020-10-07 NOTE — Telephone Encounter (Signed)
Pt is requesting medication be changed to 40mg 

## 2020-10-08 ENCOUNTER — Telehealth: Payer: Self-pay | Admitting: Neurology

## 2020-10-08 ENCOUNTER — Other Ambulatory Visit: Payer: Self-pay

## 2020-10-08 DIAGNOSIS — K219 Gastro-esophageal reflux disease without esophagitis: Secondary | ICD-10-CM

## 2020-10-08 MED ORDER — PANTOPRAZOLE SODIUM 40 MG PO TBEC: 40.0000 mg | DELAYED_RELEASE_TABLET | Freq: Every day | ORAL | 3 refills | Status: DC

## 2020-10-08 NOTE — Telephone Encounter (Signed)
Pt was informed of medication being sent to pharmacy.

## 2020-10-08 NOTE — Telephone Encounter (Signed)
Pt called in stating after Dr. Alvis Lemmings gave her gout medication she had had issues with her back. She says her back itches on the left side in the same spot all the time and gets numb. When it does this, she has to go sit down. She states Dr. Alvis Lemmings will not talk with her.

## 2020-10-08 NOTE — Telephone Encounter (Signed)
Pt called no answer no voice mail set up when she calls back we need to let her know to call Dr Earley Abide office about her reaction to the medication he gave her. Will try to call her back later,

## 2020-10-08 NOTE — Telephone Encounter (Signed)
Stated her shoulder and back was bothering her and wanted it checked it out pt advised she needed to see her primary Dr. She feels like it is getting worse she stated that last time she was here Dr Karel Jarvis looked at her back and would like for her to see her again. Pt advised that is not what Dr Karel Jarvis treats pt stated she had the right to be seen by her Dr and to know what is going on and that Dr Karel Jarvis was the on who checked her out,

## 2020-10-15 ENCOUNTER — Telehealth: Payer: Self-pay | Admitting: Family Medicine

## 2020-10-15 NOTE — Telephone Encounter (Signed)
Copied from CRM [id number]. Topic: Referral - Request for Referral >> Oct 15, 2020  4:16 PM Jaquita Rector A wrote: Has patient seen PCP for this complaint? Yes.   *If NO, is insurance requiring patient see PCP for this issue before PCP can refer them? Referral for which specialty: Dentist  Preferred provider/office: none specified Reason for referral: Tooth ache

## 2020-10-21 NOTE — Telephone Encounter (Signed)
Will mail dental resource form to patient.

## 2020-10-21 NOTE — Telephone Encounter (Signed)
Pt reached out to get update about getting referred to a dentist, and has been waiting for a week for someone to give her a call. Please advise.

## 2020-10-31 ENCOUNTER — Other Ambulatory Visit: Payer: Self-pay | Admitting: Family Medicine

## 2020-10-31 ENCOUNTER — Other Ambulatory Visit: Payer: Self-pay | Admitting: Physician Assistant

## 2020-10-31 DIAGNOSIS — E119 Type 2 diabetes mellitus without complications: Secondary | ICD-10-CM

## 2020-10-31 NOTE — Telephone Encounter (Signed)
Requested Prescriptions  Pending Prescriptions Disp Refills  . atorvastatin (LIPITOR) 20 MG tablet [Pharmacy Med Name: ATORVASTATIN 20MG  TABLETS] 90 tablet 0    Sig: TAKE 1 TABLET BY MOUTH EVERY DAY     Cardiovascular:  Antilipid - Statins Passed - 10/31/2020  5:51 PM      Passed - Total Cholesterol in normal range and within 360 days    Cholesterol, Total  Date Value Ref Range Status  01/02/2020 151 100 - 199 mg/dL Final         Passed - LDL in normal range and within 360 days    LDL Chol Calc (NIH)  Date Value Ref Range Status  01/02/2020 65 0 - 99 mg/dL Final   Direct LDL  Date Value Ref Range Status  01/30/2008 153 (H) mg/dL Final    Comment:    See lab report for associated comment(s)         Passed - HDL in normal range and within 360 days    HDL  Date Value Ref Range Status  01/02/2020 66 >39 mg/dL Final         Passed - Triglycerides in normal range and within 360 days    Triglycerides  Date Value Ref Range Status  01/02/2020 115 0 - 149 mg/dL Final         Passed - Patient is not pregnant      Passed - Valid encounter within last 12 months    Recent Outpatient Visits          2 months ago Gastroesophageal reflux disease without esophagitis   [facility] 13/11/2019 [facility] [place], [place], [place]   4 months ago    [facility] [place], [place], [place]   4 months ago Gastroesophageal reflux disease without esophagitis   [place] [facility] [place], [place] B, [place]   7 months ago Controlled type 2 diabetes mellitus without complication, without long-term current use of insulin (HCC)   [facility] [place], [place], [place]   7 months ago Gastroesophageal reflux disease, unspecified whether esophagitis present   [facility], [place]      Future Appointments            In 2 weeks Flo Shanks, MD [facility]

## 2020-11-01 NOTE — Telephone Encounter (Signed)
Requested Prescriptions  Pending Prescriptions Disp Refills  . cloNIDine (CATAPRES) 0.1 MG tablet [Pharmacy Med Name: CLONIDINE 0.1MG  TABLETS] 90 tablet 1    Sig: TAKE 1 TABLET(0.1 MG) BY MOUTH AT BEDTIME AS NEEDED FOR HOT FLASHES     Cardiovascular:  Alpha-2 Agonists Passed - 10/31/2020  5:51 PM      Passed - Last BP in normal range    BP Readings from Last 1 Encounters:  08/14/20 107/69         Passed - Last Heart Rate in normal range    Pulse Readings from Last 1 Encounters:  08/14/20 84         Passed - Valid encounter within last 6 months    Recent Outpatient Visits          2 months ago Gastroesophageal reflux disease without esophagitis   [facility] [place], [place], [place]   4 months ago    [facility] [place], [place], [place]   4 months ago Gastroesophageal reflux disease without esophagitis   [facility] [place], Gavin Pound B, [place]   7 months ago Controlled type 2 diabetes mellitus without complication, without long-term current use of insulin (HCC)   [facility] [place], Odette [place]s, [place]   7 months ago Gastroesophageal reflux disease, unspecified whether esophagitis present   [facility] [place] Shanks, MD      Future Appointments            In 2 weeks Hoy Register, MD [facility]

## 2020-11-03 ENCOUNTER — Other Ambulatory Visit: Payer: Self-pay | Admitting: Family Medicine

## 2020-11-03 DIAGNOSIS — I10 Essential (primary) hypertension: Secondary | ICD-10-CM

## 2020-11-17 ENCOUNTER — Other Ambulatory Visit: Payer: Self-pay

## 2020-11-17 ENCOUNTER — Encounter: Payer: Self-pay | Admitting: Family Medicine

## 2020-11-17 ENCOUNTER — Ambulatory Visit: Payer: Self-pay | Attending: Family Medicine | Admitting: Family Medicine

## 2020-11-17 VITALS — BP 125/83 | HR 93 | Wt 145.8 lb

## 2020-11-17 DIAGNOSIS — E119 Type 2 diabetes mellitus without complications: Secondary | ICD-10-CM

## 2020-11-17 DIAGNOSIS — Z1159 Encounter for screening for other viral diseases: Secondary | ICD-10-CM

## 2020-11-17 DIAGNOSIS — M549 Dorsalgia, unspecified: Secondary | ICD-10-CM

## 2020-11-17 DIAGNOSIS — M1A00X0 Idiopathic chronic gout, unspecified site, without tophus (tophi): Secondary | ICD-10-CM

## 2020-11-17 DIAGNOSIS — Z23 Encounter for immunization: Secondary | ICD-10-CM

## 2020-11-17 DIAGNOSIS — K219 Gastro-esophageal reflux disease without esophagitis: Secondary | ICD-10-CM

## 2020-11-17 DIAGNOSIS — R569 Unspecified convulsions: Secondary | ICD-10-CM

## 2020-11-17 LAB — POCT GLYCOSYLATED HEMOGLOBIN (HGB A1C): HbA1c, POC (controlled diabetic range): 5.4 % (ref 0.0–7.0)

## 2020-11-17 NOTE — Progress Notes (Signed)
Subjective:  Patient ID: Christina Byrd, female    DOB: [birth year]  Age: 55 y.o. MRN: [id number]  CC: Diabetes   HPI Christina Byrd is a 55 y.o. year old female with a history of type 2 diabetes mellitus (A1c 5.4), GERD, Seizures , Gout here for chronic disease management.  Interval History: The L side of her back hurts but Diclofenac has been effective.  She would not like an additional muscle relaxant as she states she is doing fine.  Pain does not radiate.  Her job is very physical and involves lifting. Her diabetes mellitus is diet controlled and she denies numbness in extremities, visual concerns or neuropathy. Gout has been stable with no exacerbations.  She informs me that she previously received a medication for Gout from me that "paralyzed" her.  She states she never had gout but arthritis.  I have reviewed her uric acid levels with her from 11/2018 which revealed values of 10.9, 9.5 and most recently 6.2.  She currently is not on any medication chronically for gout. Her seizures are managed by neurology-Dr. Delice Lesch.  Past Medical History:  Diagnosis Date   GASTROESOPHAGEAL REFLUX, NO ESOPHAGITIS 04/21/2006   Qualifier: Diagnosis of  By: Benna Dunks     GERD (gastroesophageal reflux disease) Dx 1995   HTN (hypertension)     History reviewed. No pertinent surgical history.  Family History  Problem Relation Age of Onset   CAD Mother     Allergies  Allergen Reactions   Losartan Potassium     Rash   Levaquin [Levofloxacin In D5w] Rash   Ace Inhibitors Cough    REACTION: cough   Codeine Nausea Only   Cefepime Rash    09/04/12 pm Patient started to break out with small macules after IV Vanco infusion, then macules increased in size after starting cefepime infusion.   Vancomycin Rash    09/04/12 pm Patient started to break out with small macules after IV Vanco infusion, then macules increased in size after starting cefepime infusion.    Outpatient Medications  Prior to Visit  Medication Sig Dispense Refill   amLODipine (NORVASC) 10 MG tablet TAKE 1 TABLET(10 MG) BY MOUTH DAILY 90 tablet 0   aspirin EC 81 MG tablet Take 1 tablet (81 mg total) by mouth daily.     atorvastatin (LIPITOR) 20 MG tablet TAKE 1 TABLET BY MOUTH EVERY DAY 90 tablet 0   Blood Glucose Monitoring Suppl (TRUE METRIX METER) DEVI 1 kit by Does not apply route 3 (three) times daily. CHECK BLOOD SUGAR UP TO 3 TIMES DAILY. E11.9 1 each 0   cloNIDine (CATAPRES) 0.1 MG tablet TAKE 1 TABLET(0.1 MG) BY MOUTH AT BEDTIME AS NEEDED FOR HOT FLASHES 30 tablet 1   diclofenac (VOLTAREN) 75 MG EC tablet TAKE 1 TABLET(75 MG) BY MOUTH TWICE DAILY 60 tablet 1   folic acid (FOLVITE) 1 MG tablet Take 1 tablet (1 mg total) by mouth daily. 30 tablet 11   glucose blood (TRUE METRIX BLOOD GLUCOSE TEST) test strip Use as instructed 100 each 12   levETIRAcetam (KEPPRA) 500 MG tablet Take 1 tablet (500 mg total) by mouth 2 (two) times daily. 60 tablet 11   magnesium oxide (MAG-OX) 400 MG tablet Take 1 tablet (400 mg total) by mouth daily. 30 tablet 3   pantoprazole (PROTONIX) 40 MG tablet Take 1 tablet (40 mg total) by mouth daily. 30 tablet 3   thiamine (VITAMIN B-1) 100 MG tablet Take 1 tablet (100 mg  total) by mouth daily. 30 tablet 11   TRUEplus Lancets 28G MISC SMARTSIG:Topical 1 to 3 Times Daily 100 each 3   vitamin B-12 (CYANOCOBALAMIN) 1000 MCG tablet Take 1 tablet (1,000 mcg total) by mouth daily. 30 tablet 11   No facility-administered medications prior to visit.     ROS Review of Systems  Constitutional:  Negative for activity change, appetite change and fatigue.  HENT:  Negative for congestion, sinus pressure and sore throat.   Eyes:  Negative for visual disturbance.  Respiratory:  Negative for cough, chest tightness, shortness of breath and wheezing.   Cardiovascular:  Negative for chest pain and palpitations.  Gastrointestinal:  Negative for abdominal distention, abdominal pain and  constipation.  Endocrine: Negative for polydipsia.  Genitourinary:  Negative for dysuria and frequency.  Musculoskeletal:  Positive for back pain. Negative for arthralgias.  Skin:  Negative for rash.  Neurological:  Negative for tremors, light-headedness and numbness.  Hematological:  Does not bruise/bleed easily.  Psychiatric/Behavioral:  Negative for agitation and behavioral problems.    Objective:  BP 125/83   Pulse 93   Wt 145 lb 12.8 oz (66.1 kg)   LMP 12/17/2014   SpO2 99%   BMI 26.67 kg/m   BP/Weight 11/17/2020 08/14/2020 1/59/4585  Systolic BP 929 244 628  Diastolic BP 83 69 71  Wt. (Lbs) 145.8 148 147  BMI 26.67 27.07 26.89      Physical Exam Constitutional:      Appearance: She is well-developed.  Cardiovascular:     Rate and Rhythm: Normal rate.     Heart sounds: Normal heart sounds. No murmur heard. Pulmonary:     Effort: Pulmonary effort is normal.     Breath sounds: Normal breath sounds. No wheezing or rales.  Chest:     Chest wall: No tenderness.  Abdominal:     General: Bowel sounds are normal. There is no distension.     Palpations: Abdomen is soft. There is no mass.     Tenderness: There is no abdominal tenderness.  Musculoskeletal:        General: Normal range of motion.     Right lower leg: No edema.     Left lower leg: No edema.     Comments: Slight tenderness on deep palpation of left side of lumbar spine Negative straight leg raise bilaterally  Neurological:     Mental Status: She is alert and oriented to person, place, and time.  Psychiatric:        Mood and Affect: Mood normal.  Diabetic Foot Exam - Simple   Simple Foot Form Diabetic Foot exam was performed with the following findings: Yes 11/17/2020  4:14 PM  Visual Inspection No deformities, no ulcerations, no other skin breakdown bilaterally: Yes Sensation Testing Intact to touch and monofilament testing bilaterally: Yes Pulse Check Posterior Tibialis and Dorsalis pulse intact  bilaterally: Yes Comments      CMP Latest Ref Rng & Units 01/02/2020 04/24/2019 10/04/2018  Glucose 65 - 99 mg/dL 83 98 86  BUN 6 - 24 mg/dL $Remove'17 10 13  'wfvacKk$ Creatinine 0.57 - 1.00 mg/dL 1.17(H) 0.92 0.95  Sodium 134 - 144 mmol/L 144 143 142  Potassium 3.5 - 5.2 mmol/L 4.8 4.2 4.2  Chloride 96 - 106 mmol/L 104 102 103  CO2 20 - 29 mmol/L $RemoveB'22 23 21  'OlTHmomT$ Calcium 8.7 - 10.2 mg/dL 9.6 9.4 8.3(L)  Total Protein 6.0 - 8.5 g/dL 7.1 - -  Total Bilirubin 0.0 - 1.2 mg/dL 0.4 - -  Alkaline Phos 44 - 121 IU/L 126(H) - -  AST 0 - 40 IU/L 14 - -  ALT 0 - 32 IU/L 10 - -    Lipid Panel     Component Value Date/Time   CHOL 151 01/02/2020 1100   TRIG 115 01/02/2020 1100   HDL 66 01/02/2020 1100   CHOLHDL 2.3 01/02/2020 1100   CHOLHDL 2.4 10/26/2012 1052   VLDL 46 (H) 10/26/2012 1052   LDLCALC 65 01/02/2020 1100   LDLDIRECT 153 (H) 01/30/2008 2146    CBC    Component Value Date/Time   WBC 7.4 04/24/2019 1459   WBC 8.7 11/10/2017 1144   RBC 4.13 04/24/2019 1459   RBC 4.36 11/10/2017 1144   HGB 12.9 04/24/2019 1459   HCT 38.5 04/24/2019 1459   PLT 264 04/24/2019 1459   MCV 93 04/24/2019 1459   MCH 31.2 04/24/2019 1459   MCH 29.4 11/10/2017 1144   MCHC 33.5 04/24/2019 1459   MCHC 32.3 11/10/2017 1144   RDW 14.8 04/24/2019 1459   LYMPHSABS 2.1 04/24/2019 1459   MONOABS 0.5 11/10/2017 1144   EOSABS 0.1 04/24/2019 1459   BASOSABS 0.0 04/24/2019 1459    Lab Results  Component Value Date   HGBA1C 5.4 11/17/2020    Assessment & Plan:  1. Controlled type 2 diabetes mellitus without complication, without long-term current use of insulin (HCC) Diet controlled with A1c of 5.4 Continue current regimen Counseled on Diabetic diet, my plate method, 248 minutes of moderate intensity exercise/week Blood sugar logs with fasting goals of 80-120 mg/dl, random of less than 180 and in the event of sugars less than 60 mg/dl or greater than 400 mg/dl encouraged to notify the clinic. Advised on the need for  annual eye exams, annual foot exams, Pneumonia vaccine. - Lipid panel; Future - CMP14+EGFR; Future - Microalbumin / creatinine urine ratio; Future - POCT glycosylated hemoglobin (Hb A1C)  2. Need for hepatitis C screening test - HCV RNA quant rflx ultra or genotyp([facility]corp/Sunquest); Future  3. Seizure ([place]) Stable on Keppra  4. Gastroesophageal reflux disease without esophagitis Controlled on Protonix  5. Idiopathic chronic gout without tophus, unspecified site Controlled with no exacerbation She is not on any chronic medications  6. Musculoskeletal back pain Doing well on diclofenac Declines addition of muscle relaxant Advised to apply heat or ice whichever is tolerated to painful areas. Counseled on evidence of improvement in pain control with regards to yoga, water aerobics, massage, home physical therapy, exercise as tolerated.    No orders of the defined types were placed in this encounter.   Follow-up: Return in about 6 months (around 05/17/2021) for Medical conditions.       Charlott Rakes, MD, FAAFP. [facility] [place], [place]   11/17/2020, 4:25 PM

## 2020-11-17 NOTE — Patient Instructions (Signed)

## 2020-11-18 NOTE — Addendum Note (Signed)
Addended byMemory Dance on: 11/18/2020 11:10 AM   Modules accepted: Orders

## 2020-11-20 LAB — CMP14+EGFR
ALT: 10 IU/L (ref 0–32)
AST: 16 IU/L (ref 0–40)
Albumin/Globulin Ratio: 1.6 (ref 1.2–2.2)
Albumin: 4.5 g/dL (ref 3.8–4.9)
Alkaline Phosphatase: 147 IU/L — ABNORMAL HIGH (ref 44–121)
BUN/Creatinine Ratio: 19 (ref 9–23)
BUN: 19 mg/dL (ref 6–24)
Bilirubin Total: 0.3 mg/dL (ref 0.0–1.2)
CO2: 22 mmol/L (ref 20–29)
Calcium: 9.2 mg/dL (ref 8.7–10.2)
Chloride: 106 mmol/L (ref 96–106)
Creatinine, Ser: 0.98 mg/dL (ref 0.57–1.00)
Globulin, Total: 2.8 g/dL (ref 1.5–4.5)
Glucose: 75 mg/dL (ref 70–99)
Potassium: 4.1 mmol/L (ref 3.5–5.2)
Sodium: 144 mmol/L (ref 134–144)
Total Protein: 7.3 g/dL (ref 6.0–8.5)
eGFR: 68 mL/min/{1.73_m2} (ref >59)

## 2020-11-20 LAB — LIPID PANEL
Chol/HDL Ratio: 2.2 ratio (ref 0.0–4.4)
Cholesterol, Total: 171 mg/dL (ref 100–199)
HDL: 78 mg/dL (ref >39)
LDL Chol Calc (NIH): 56 mg/dL (ref 0–99)
Triglycerides: 241 mg/dL — ABNORMAL HIGH (ref 0–149)
VLDL Cholesterol Cal: 37 mg/dL (ref 5–40)

## 2020-11-20 LAB — HCV RNA QUANT RFLX ULTRA OR GENOTYP: HCV Quant Baseline: NOT DETECTED IU/mL

## 2020-11-20 LAB — MICROALBUMIN / CREATININE URINE RATIO
Creatinine, Urine: 57.6 mg/dL
Microalb/Creat Ratio: 23 mg/g creat (ref 0–29)
Microalbumin, Urine: 13.4 ug/mL

## 2020-11-21 ENCOUNTER — Telehealth: Payer: Self-pay

## 2020-11-21 NOTE — Telephone Encounter (Signed)
Patient name and DOB has been verified Patient was informed of lab results. Patient had no questions.  

## 2020-11-21 NOTE — Telephone Encounter (Signed)
-----   Message from Hoy Register, MD sent at 11/19/2020  5:16 PM EDT ----- Please inform the patient that labs are stable.

## 2020-11-28 ENCOUNTER — Other Ambulatory Visit: Payer: Self-pay | Admitting: Family Medicine

## 2020-11-28 DIAGNOSIS — E119 Type 2 diabetes mellitus without complications: Secondary | ICD-10-CM

## 2020-11-29 NOTE — Telephone Encounter (Signed)
Requested medications are due for refill today no  Requested medications are on the active medication list yes  Last refill 9/10 for 90 day supply  Last visit 11/17/20  Future visit scheduled 05/18/20  Notes to clinic requesting too soon, please assess.

## 2020-12-26 ENCOUNTER — Other Ambulatory Visit: Payer: Self-pay | Admitting: Family Medicine

## 2020-12-26 NOTE — Telephone Encounter (Signed)
Requested Prescriptions  Pending Prescriptions Disp Refills  . cloNIDine (CATAPRES) 0.1 MG tablet [Pharmacy Med Name: CLONIDINE 0.1MG  TABLETS] 30 tablet 3    Sig: TAKE 1 TABLET(0.1 MG) BY MOUTH AT BEDTIME AS NEEDED FOR HOT FLASHES     Cardiovascular:  Alpha-2 Agonists Passed - 12/26/2020  3:31 AM      Passed - Last BP in normal range    BP Readings from Last 1 Encounters:  11/17/20 125/83         Passed - Last Heart Rate in normal range    Pulse Readings from Last 1 Encounters:  11/17/20 93         Passed - Valid encounter within last 6 months    Recent Outpatient Visits          1 month ago Controlled type 2 diabetes mellitus without complication, without long-term current use of insulin (HCC)   [facility] [place], [place], [place]   4 months ago Gastroesophageal reflux disease without esophagitis   [facility] [place], [place], [place]   6 months ago    [facility] [place], [place], [place]   6 months ago Gastroesophageal reflux disease without esophagitis   [facility] [place], Gavin Pound B, [place]   8 months ago Controlled type 2 diabetes mellitus without complication, without long-term current use of insulin (HCC)   [facility] Hoy Register, MD      Future Appointments            In 4 months Hoy Register, MD [facility]

## 2021-02-03 ENCOUNTER — Telehealth (INDEPENDENT_AMBULATORY_CARE_PROVIDER_SITE_OTHER): Payer: Self-pay | Admitting: Family Medicine

## 2021-02-03 NOTE — Telephone Encounter (Signed)
Copied from CRM [id number]. Topic: General - Other >> Feb 02, 2021 12:26 PM Jaquita Rector A wrote: Reason for CRM: Patient request a call back from [facility] to let her know when to come drop off a letter can be reached at  Ph# [phone]

## 2021-02-03 NOTE — Telephone Encounter (Signed)
I return Pt call, schedule a financial appt 02/11/21 °

## 2021-02-11 ENCOUNTER — Ambulatory Visit: Payer: Self-pay | Attending: Family Medicine

## 2021-02-11 ENCOUNTER — Other Ambulatory Visit: Payer: Self-pay

## 2021-02-26 ENCOUNTER — Other Ambulatory Visit: Payer: Self-pay | Admitting: Physician Assistant

## 2021-02-26 DIAGNOSIS — E119 Type 2 diabetes mellitus without complications: Secondary | ICD-10-CM

## 2021-03-04 ENCOUNTER — Telehealth: Payer: Self-pay

## 2021-03-04 DIAGNOSIS — K0889 Other specified disorders of teeth and supporting structures: Secondary | ICD-10-CM

## 2021-03-04 NOTE — Telephone Encounter (Signed)
Done

## 2021-03-04 NOTE — Telephone Encounter (Signed)
Pt now has insurance and is requesting a new referral to the dentist.

## 2021-03-05 NOTE — Telephone Encounter (Signed)
Pt was called and informed of dental referral being placed.

## 2021-03-10 ENCOUNTER — Other Ambulatory Visit: Payer: Self-pay | Admitting: Family Medicine

## 2021-03-10 NOTE — Telephone Encounter (Signed)
Requested Prescriptions  Pending Prescriptions Disp Refills   diclofenac (VOLTAREN) 75 MG EC tablet [Pharmacy Med Name: DICLOFENAC 75MG  DR TABLETS] 180 tablet 1    Sig: TAKE 1 TABLET(75 MG) BY MOUTH TWICE DAILY     Analgesics:  NSAIDS Failed - 03/10/2021  1:34 PM      Failed - HGB in normal range and within 360 days    Hemoglobin  Date Value Ref Range Status  04/24/2019 12.9 11.1 - 15.9 g/dL Final         Passed - Cr in normal range and within 360 days    Creat  Date Value Ref Range Status  03/21/2014 1.00 0.50 - 1.10 mg/dL Final   Creatinine, Ser  Date Value Ref Range Status  11/18/2020 0.98 0.57 - 1.00 mg/dL Final   Creatinine, POC  Date Value Ref Range Status  03/25/2017 300 mg/dL Final         Passed - Patient is not pregnant      Passed - Valid encounter within last 12 months    Recent Outpatient Visits          3 months ago Controlled type 2 diabetes mellitus without complication, without long-term current use of insulin (HCC)   [facility] [place] [facility] [place], [place], [place]   6 months ago Gastroesophageal reflux disease without esophagitis   [facility] [place], [place], [place]   8 months ago    [facility] [place], [place], [place]   8 months ago Gastroesophageal reflux disease without esophagitis   [place] [facility] [place], [place], [place]   11 months ago Controlled type 2 diabetes mellitus without complication, without long-term current use of insulin (HCC)   [facility] [place] Pound, [place]      Future Appointments            In 1 week Hoy Register, MD [facility]   In 2 months [facility], MD [facility]

## 2021-03-18 ENCOUNTER — Ambulatory Visit: Payer: Self-pay | Admitting: Family Medicine

## 2021-03-24 ENCOUNTER — Ambulatory Visit: Payer: Self-pay | Attending: Family Medicine | Admitting: Family Medicine

## 2021-03-24 ENCOUNTER — Encounter: Payer: Self-pay | Admitting: Family Medicine

## 2021-03-24 ENCOUNTER — Ambulatory Visit
Admission: RE | Admit: 2021-03-24 | Discharge: 2021-03-24 | Disposition: A | Discharge status: Home / Self Care | Payer: No Typology Code available for payment source | Source: Ambulatory Visit | Attending: Family Medicine | Admitting: Family Medicine

## 2021-03-24 ENCOUNTER — Other Ambulatory Visit: Payer: Self-pay

## 2021-03-24 VITALS — BP 110/74 | HR 87 | Ht 62.0 in | Wt 141.0 lb

## 2021-03-24 DIAGNOSIS — I10 Essential (primary) hypertension: Secondary | ICD-10-CM

## 2021-03-24 DIAGNOSIS — M7501 Adhesive capsulitis of right shoulder: Secondary | ICD-10-CM

## 2021-03-24 DIAGNOSIS — N951 Menopausal and female climacteric states: Secondary | ICD-10-CM

## 2021-03-24 DIAGNOSIS — M1711 Unilateral primary osteoarthritis, right knee: Secondary | ICD-10-CM

## 2021-03-24 DIAGNOSIS — M7502 Adhesive capsulitis of left shoulder: Secondary | ICD-10-CM

## 2021-03-24 DIAGNOSIS — K219 Gastro-esophageal reflux disease without esophagitis: Secondary | ICD-10-CM

## 2021-03-24 DIAGNOSIS — E119 Type 2 diabetes mellitus without complications: Secondary | ICD-10-CM

## 2021-03-24 DIAGNOSIS — K0889 Other specified disorders of teeth and supporting structures: Secondary | ICD-10-CM

## 2021-03-24 DIAGNOSIS — K120 Recurrent oral aphthae: Secondary | ICD-10-CM

## 2021-03-24 LAB — POCT GLYCOSYLATED HEMOGLOBIN (HGB A1C): HbA1c, POC (controlled diabetic range): 5.5 % (ref 0.0–7.0)

## 2021-03-24 MED ORDER — AMLODIPINE BESYLATE 10 MG PO TABS: 10.0000 mg | ORAL_TABLET | Freq: Every day | ORAL | 1 refills | Status: DC

## 2021-03-24 MED ORDER — DEXAMETHASONE 0.5 MG/5ML PO ELIX: 0.5000 mg | ORAL_SOLUTION | Freq: Three times a day (TID) | ORAL | 0 refills | Status: DC

## 2021-03-24 MED ORDER — PREDNISONE 20 MG PO TABS: 20.0000 mg | ORAL_TABLET | Freq: Every day | ORAL | 0 refills | Status: DC

## 2021-03-24 MED ORDER — ATORVASTATIN CALCIUM 20 MG PO TABS: 20.0000 mg | ORAL_TABLET | Freq: Every day | ORAL | 1 refills | Status: DC

## 2021-03-24 MED ORDER — CLONIDINE HCL 0.1 MG PO TABS: ORAL_TABLET | ORAL | 3 refills | Status: DC

## 2021-03-24 MED ORDER — PANTOPRAZOLE SODIUM 40 MG PO TBEC: 40.0000 mg | DELAYED_RELEASE_TABLET | Freq: Every day | ORAL | 3 refills | Status: DC

## 2021-03-24 NOTE — Progress Notes (Signed)
Having dental pain, referral has been placed.  No medications in 2 months.  Knee pain(right)

## 2021-03-24 NOTE — Patient Instructions (Signed)
Adhesive Capsulitis Adhesive capsulitis, also called frozen shoulder, causes the shoulder to become stiff and painful to move. This condition happens when there is inflammation of the tendons and ligaments that surround the shoulder joint (shoulder capsule). What are the causes? This condition may be caused by: An injury to your shoulder joint. Straining your shoulder. Not moving your shoulder for a period of time. This can happen if your arm was injured or in a sling. Long-standing conditions, such as: Diabetes. Thyroid problems. Heart disease. Stroke. Rheumatoid arthritis. Lung disease. In some cases, the cause is not known. What increases the risk? You are more likely to develop this condition if you are: A woman. Older than 56 years of age. What are the signs or symptoms? Symptoms of this condition include: Pain in your shoulder when you move your arm. There may also be pain when parts of your shoulder are touched. The pain may be worse at night or when you are resting. A sore or aching shoulder. The inability to move your shoulder normally. Muscle spasms. How is this diagnosed? This condition is diagnosed with a physical exam and imaging tests, such as an X-ray or MRI. How is this treated? This condition may be treated with: Treatment of the underlying cause or condition. Medicine. Medicine may be given to relieve pain, inflammation, or muscle spasms. Steroid injections into the shoulder joint. Physical therapy. This involves performing exercises to get the shoulder moving again. Acupuncture. This is a type of treatment that involves stimulating specific points on your body by inserting thin needles through your skin. Shoulder manipulation. This is a procedure to move the shoulder into another position. It is done after you are given a medicine to make you fall asleep (general anesthetic). The joint may also be injected with salt water at high pressure to break down  scarring. Surgery. This may be done in severe cases when other treatments have failed. Although most people recover completely from adhesive capsulitis, some may not regain full shoulder movement. Follow these instructions at home: Managing pain, stiffness, and swelling   If directed, put ice on the injured area: Put ice in a plastic bag. Place a towel between your skin and the bag. Leave the ice on for 20 minutes, 2-3 times per day. If directed, apply heat to the affected area before you exercise. Use the heat source that your health care provider recommends, such as a moist heat pack or a heating pad. Place a towel between your skin and the heat source. Leave the heat on for 20-30 minutes. Remove the heat if your skin turns bright red. This is especially important if you are unable to feel pain, heat, or cold. You may have a greater risk of getting burned. General instructions Take over-the-counter and prescription medicines only as told by your health care provider. If you are being treated with physical therapy, follow instructions from your physical therapist. Avoid exercises that put a lot of demand on your shoulder, such as throwing. These exercises can make pain worse. Keep all follow-up visits as told by your health care provider. This is important. Contact a health care provider if: You develop new symptoms. Your symptoms get worse. Summary Adhesive capsulitis, also called frozen shoulder, causes the shoulder to become stiff and painful to move. You are more likely to have this condition if you are a woman and over age 40. It is treated with physical therapy, medicines, and sometimes surgery. This information is not intended to replace advice given   to you by your health care provider. Make sure you discuss any questions you have with your health care provider. Document Revised: 10/06/2020 Document Reviewed: 10/06/2020 Elsevier Patient Education  2022 [employer]  

## 2021-03-24 NOTE — Progress Notes (Signed)
Subjective:  Patient ID: Christina Byrd, female    DOB: Dec 24, 1965  Age: 56 y.o. MRN: [id number]  CC: Dental Pain   HPI SHONTAY WALLNER is a 56 y.o. year old female with a history of type 2 diabetes mellitus (A1c 5.5), GERD, Seizures , Gout here for chronic disease management.    Interval History: She states she is about to file for disability. Her R knee hurts so bad and is swollen and this was recently exacerbated by her work. Her L shoulder is starting to hurt; R shoulder previously hurt and she has to use her other arm to elevate it and same thing for the L; pain in R>L.  Pain is described as severe. She has Diclofenac which is effective but only kicks in after 2 hours.  Her teeth hurt and she has been unable to eat. She has partial dentures in upper gum.  Referred to her dentist several times but she has been unable to see them due to lack of insurance.  She also has an aphthous ulcer which is painful.  She has had no medications in 2 months as her insurance cancelled out on her but she now has insurance and would like to have refills on her medications. Her diabetes is diet controlled.  Prior to the last 2 months she was compliant with her antihypertensive.  Reflux symptoms have been controlled She has hot flashes now during the day no longer at night.  She previously took clonidine at night for hot flashes at night but is wondering if she can take this during the day.  Past Medical History:  Diagnosis Date   GASTROESOPHAGEAL REFLUX, NO ESOPHAGITIS 04/21/2006   Qualifier: Diagnosis of  By: Benna Dunks     GERD (gastroesophageal reflux disease) Dx 1995   HTN (hypertension)     No past surgical history on file.  Family History  Problem Relation Age of Onset   CAD Mother     Allergies  Allergen Reactions   Losartan Potassium     Rash   Levaquin [Levofloxacin In D5w] Rash   Ace Inhibitors Cough    REACTION: cough   Codeine Nausea Only   Cefepime Rash    09/04/12  pm Patient started to break out with small macules after IV Vanco infusion, then macules increased in size after starting cefepime infusion.   Vancomycin Rash    09/04/12 pm Patient started to break out with small macules after IV Vanco infusion, then macules increased in size after starting cefepime infusion.    Outpatient Medications Prior to Visit  Medication Sig Dispense Refill   aspirin EC 81 MG tablet Take 1 tablet (81 mg total) by mouth daily.     Blood Glucose Monitoring Suppl (TRUE METRIX METER) DEVI 1 kit by Does not apply route 3 (three) times daily. CHECK BLOOD SUGAR UP TO 3 TIMES DAILY. E11.9 1 each 0   diclofenac (VOLTAREN) 75 MG EC tablet TAKE 1 TABLET(75 MG) BY MOUTH TWICE DAILY 292 tablet 1   folic acid (FOLVITE) 1 MG tablet Take 1 tablet (1 mg total) by mouth daily. 30 tablet 11   glucose blood (TRUE METRIX BLOOD GLUCOSE TEST) test strip Use as instructed 100 each 12   levETIRAcetam (KEPPRA) 500 MG tablet Take 1 tablet (500 mg total) by mouth 2 (two) times daily. 60 tablet 11   magnesium oxide (MAG-OX) 400 MG tablet Take 1 tablet (400 mg total) by mouth daily. 30 tablet 3   thiamine (VITAMIN  B-1) 100 MG tablet Take 1 tablet (100 mg total) by mouth daily. 30 tablet 11   TRUEplus Lancets 28G MISC SMARTSIG:Topical 1 to 3 Times Daily 100 each 3   vitamin B-12 (CYANOCOBALAMIN) 1000 MCG tablet Take 1 tablet (1,000 mcg total) by mouth daily. 30 tablet 11   amLODipine (NORVASC) 10 MG tablet TAKE 1 TABLET(10 MG) BY MOUTH DAILY 90 tablet 0   atorvastatin (LIPITOR) 20 MG tablet TAKE 1 TABLET BY MOUTH EVERY DAY 90 tablet 0   cloNIDine (CATAPRES) 0.1 MG tablet TAKE 1 TABLET(0.1 MG) BY MOUTH AT BEDTIME AS NEEDED FOR HOT FLASHES 30 tablet 3   pantoprazole (PROTONIX) 40 MG tablet Take 1 tablet (40 mg total) by mouth daily. 30 tablet 3   No facility-administered medications prior to visit.     ROS Review of Systems  Constitutional:  Negative for activity change, appetite change and  fatigue.  HENT:  Positive for dental problem. Negative for congestion, sinus pressure and sore throat.   Eyes:  Negative for visual disturbance.  Respiratory:  Negative for cough, chest tightness, shortness of breath and wheezing.   Cardiovascular:  Negative for chest pain and palpitations.  Gastrointestinal:  Negative for abdominal distention, abdominal pain and constipation.  Endocrine: Negative for polydipsia.  Genitourinary:  Negative for dysuria and frequency.  Musculoskeletal:        See HPI  Skin:  Negative for rash.  Neurological:  Negative for tremors, light-headedness and numbness.  Hematological:  Does not bruise/bleed easily.  Psychiatric/Behavioral:  Negative for agitation and behavioral problems.    Objective:  BP 110/74    Pulse 87    Ht $R'5\' 2"'oa$  (1.575 m)    Wt 141 lb (64 kg)    LMP 12/17/2014    SpO2 98%    BMI 25.79 kg/m   BP/Weight 03/24/2021 11/17/2020 6/97/9480  Systolic BP 165 537 482  Diastolic BP 74 83 69  Wt. (Lbs) 141 145.8 148  BMI 25.79 26.67 27.07      Physical Exam Constitutional:      Appearance: She is well-developed.  HENT:     Mouth/Throat:     Comments: Partial dentures in place Aphthous ulcer on left side of buccal mucosa Cardiovascular:     Rate and Rhythm: Normal rate.     Heart sounds: Normal heart sounds. No murmur heard. Pulmonary:     Effort: Pulmonary effort is normal.     Breath sounds: Normal breath sounds. No wheezing or rales.  Chest:     Chest wall: No tenderness.  Abdominal:     General: Bowel sounds are normal. There is no distension.     Palpations: Abdomen is soft. There is no mass.     Tenderness: There is no abdominal tenderness.  Musculoskeletal:     Right lower leg: No edema.     Left lower leg: No edema.     Comments: Right knee edema more pronounced on medial knee severely tender on inferolateral aspect and entire medial aspect.  Severely restricted range of motion Active abduction of both shoulders less than 90  degrees then only fully achievable with assistance of contralateral arm  Neurological:     Mental Status: She is alert and oriented to person, place, and time.  Psychiatric:        Mood and Affect: Mood normal.    CMP Latest Ref Rng & Units 11/18/2020 01/02/2020 04/24/2019  Glucose 70 - 99 mg/dL 75 83 98  BUN 6 - 24 mg/dL 19  17 10  Creatinine 0.57 - 1.00 mg/dL 0.98 1.17(H) 0.92  Sodium 134 - 144 mmol/L 144 144 143  Potassium 3.5 - 5.2 mmol/L 4.1 4.8 4.2  Chloride 96 - 106 mmol/L 106 104 102  CO2 20 - 29 mmol/L $RemoveB'22 22 23  'bZFMzLGa$ Calcium 8.7 - 10.2 mg/dL 9.2 9.6 9.4  Total Protein 6.0 - 8.5 g/dL 7.3 7.1 -  Total Bilirubin 0.0 - 1.2 mg/dL 0.3 0.4 -  Alkaline Phos 44 - 121 IU/L 147(H) 126(H) -  AST 0 - 40 IU/L 16 14 -  ALT 0 - 32 IU/L 10 10 -    Lipid Panel     Component Value Date/Time   CHOL 171 11/18/2020 1115   TRIG 241 (H) 11/18/2020 1115   HDL 78 11/18/2020 1115   CHOLHDL 2.2 11/18/2020 1115   CHOLHDL 2.4 10/26/2012 1052   VLDL 46 (H) 10/26/2012 1052   LDLCALC 56 11/18/2020 1115   LDLDIRECT 153 (H) 01/30/2008 2146    CBC    Component Value Date/Time   WBC 7.4 04/24/2019 1459   WBC 8.7 11/10/2017 1144   RBC 4.13 04/24/2019 1459   RBC 4.36 11/10/2017 1144   HGB 12.9 04/24/2019 1459   HCT 38.5 04/24/2019 1459   PLT 264 04/24/2019 1459   MCV 93 04/24/2019 1459   MCH 31.2 04/24/2019 1459   MCH 29.4 11/10/2017 1144   MCHC 33.5 04/24/2019 1459   MCHC 32.3 11/10/2017 1144   RDW 14.8 04/24/2019 1459   LYMPHSABS 2.1 04/24/2019 1459   MONOABS 0.5 11/10/2017 1144   EOSABS 0.1 04/24/2019 1459   BASOSABS 0.0 04/24/2019 1459    Lab Results  Component Value Date   HGBA1C 5.5 03/24/2021    Assessment & Plan:  1. HYPERTENSION, BENIGN ESSENTIAL Controlled Counseled on blood pressure goal of less than 130/80, low-sodium, DASH diet, medication compliance, 150 minutes of moderate intensity exercise per week. Discussed medication compliance, adverse effects. - amLODipine (NORVASC)  10 MG tablet; Take 1 tablet (10 mg total) by mouth daily.  Dispense: 90 tablet; Refill: 1  2. Controlled type 2 diabetes mellitus without complication, without long-term current use of insulin (HCC) Diet controlled with A1c of 5.5 - atorvastatin (LIPITOR) 20 MG tablet; Take 1 tablet (20 mg total) by mouth daily.  Dispense: 90 tablet; Refill: 1 - Basic Metabolic Panel - POCT glycosylated hemoglobin (Hb A1C)  3. Gastroesophageal reflux disease without esophagitis Stable - pantoprazole (PROTONIX) 40 MG tablet; Take 1 tablet (40 mg total) by mouth daily.  Dispense: 30 tablet; Refill: 3  4. Vasomotor symptoms due to menopause Uncontrolled Advised that she can take clonidine during the day as well - cloNIDine (CATAPRES) 0.1 MG tablet; TAKE 1 TABLET(0.1 MG) BY MOUTH AT BEDTIME AS NEEDED FOR HOT FLASHES  Dispense: 30 tablet; Refill: 3  5. Pain, dental - Ambulatory referral to Dentistry  6. Adhesive capsulitis of both shoulders Uncontrolled Currently on diclofenac She will benefit from cortisone injections - DG Shoulder Left; Future - AMB referral to orthopedics - DG Shoulder Right; Future  7. Osteoarthritis of right knee, unspecified osteoarthritis type Uncontrolled She does have a history of gout but is currently not on medications as she had declined in the past because she stated they made her paralyzed. Will check imaging to assess for presence of effusion Referring to orthopedic as well - Uric Acid - CBC with Differential/Platelet  8. Aphthous ulcer Placed on dexamethasone elixir   Meds ordered this encounter  Medications   amLODipine (NORVASC) 10  MG tablet    Sig: Take 1 tablet (10 mg total) by mouth daily.    Dispense:  90 tablet    Refill:  1   atorvastatin (LIPITOR) 20 MG tablet    Sig: Take 1 tablet (20 mg total) by mouth daily.    Dispense:  90 tablet    Refill:  1   cloNIDine (CATAPRES) 0.1 MG tablet    Sig: TAKE 1 TABLET(0.1 MG) BY MOUTH AT BEDTIME AS NEEDED  FOR HOT FLASHES    Dispense:  30 tablet    Refill:  3   pantoprazole (PROTONIX) 40 MG tablet    Sig: Take 1 tablet (40 mg total) by mouth daily.    Dispense:  30 tablet    Refill:  3   predniSONE (DELTASONE) 20 MG tablet    Sig: Take 1 tablet (20 mg total) by mouth daily with breakfast.    Dispense:  5 tablet    Refill:  0   dexamethasone 0.5 MG/5ML elixir    Sig: Take 5 mLs (0.5 mg total) by mouth 3 (three) times daily. Swish for 5 minutes and spit out    Dispense:  100 mL    Refill:  0    Follow-up: Return in about 3 months (around 06/21/2021) for Chronic medical conditions.    47 minutes of total face to face time spent including median intraservice time reviewing previous notes and test results, counseling patient on diagnosis and work up of shoulder and knee pain in addition to management of chronic medical conditions.Time also spent ordering medications, investigations and documenting in the chart.  All questions were answered to the patient's satisfaction    Charlott Rakes, MD, FAAFP. [facility] and [facility] [place], [place]   03/24/2021, 12:21 PM

## 2021-03-25 LAB — BASIC METABOLIC PANEL
BUN/Creatinine Ratio: 12 (ref 9–23)
BUN: 11 mg/dL (ref 6–24)
CO2: 23 mmol/L (ref 20–29)
Calcium: 9.3 mg/dL (ref 8.7–10.2)
Chloride: 106 mmol/L (ref 96–106)
Creatinine, Ser: 0.92 mg/dL (ref 0.57–1.00)
Glucose: 75 mg/dL (ref 70–99)
Potassium: 4.7 mmol/L (ref 3.5–5.2)
Sodium: 146 mmol/L — ABNORMAL HIGH (ref 134–144)
eGFR: 74 mL/min/{1.73_m2} (ref >59)

## 2021-03-25 LAB — CBC WITH DIFFERENTIAL/PLATELET
Basophils Absolute: 0.1 10*3/uL (ref 0.0–0.2)
Basos: 1 %
EOS (ABSOLUTE): 0.1 10*3/uL (ref 0.0–0.4)
Eos: 2 %
Hematocrit: 38.3 % (ref 34.0–46.6)
Hemoglobin: 12.8 g/dL (ref 11.1–15.9)
Immature Grans (Abs): 0 10*3/uL (ref 0.0–0.1)
Immature Granulocytes: 0 %
Lymphocytes Absolute: 2.4 10*3/uL (ref 0.7–3.1)
Lymphs: 41 %
MCH: 30.8 pg (ref 26.6–33.0)
MCHC: 33.4 g/dL (ref 31.5–35.7)
MCV: 92 fL (ref 79–97)
Monocytes Absolute: 0.4 10*3/uL (ref 0.1–0.9)
Monocytes: 7 %
Neutrophils Absolute: 2.9 10*3/uL (ref 1.4–7.0)
Neutrophils: 49 %
Platelets: 270 10*3/uL (ref 150–450)
RBC: 4.15 x10E6/uL (ref 3.77–5.28)
RDW: 14.5 % (ref 11.7–15.4)
WBC: 5.8 10*3/uL (ref 3.4–10.8)

## 2021-03-25 LAB — URIC ACID: Uric Acid: 9.5 mg/dL — ABNORMAL HIGH (ref 3.0–7.2)

## 2021-03-26 ENCOUNTER — Telehealth: Payer: Self-pay

## 2021-03-26 NOTE — Telephone Encounter (Signed)
-----   Message from Hoy Register, MD sent at 03/25/2021 12:32 PM EST ----- Xray reveals arthritis in L shoulder. I referred her to Orthopedics at her last visit for further management

## 2021-03-26 NOTE — Telephone Encounter (Signed)
-----   Message from Charlott Rakes, MD sent at 03/26/2021  2:51 PM EST ----- Right shoulder x-ray is normal.

## 2021-03-26 NOTE — Telephone Encounter (Signed)
Patient name and DOB has been verified Patient was informed of lab results. Patient had no questions.  

## 2021-03-26 NOTE — Telephone Encounter (Signed)
-----   Message from Charlott Rakes, MD sent at 03/25/2021  8:41 AM EST ----- Please inform her that she has an elevated uric acid which indicates presence of a gout flare in her knee.  She declined gout medications at her last visit.  Hopefully the prednisone I prescribed will be beneficial for her symptoms.

## 2021-04-06 ENCOUNTER — Ambulatory Visit (INDEPENDENT_AMBULATORY_CARE_PROVIDER_SITE_OTHER): Payer: Managed Care, Other (non HMO) | Admitting: Physician Assistant

## 2021-04-06 ENCOUNTER — Encounter: Payer: Self-pay | Admitting: Physician Assistant

## 2021-04-06 ENCOUNTER — Other Ambulatory Visit: Payer: Self-pay

## 2021-04-06 DIAGNOSIS — M7501 Adhesive capsulitis of right shoulder: Secondary | ICD-10-CM | POA: Diagnosis not present

## 2021-04-06 NOTE — Progress Notes (Addendum)
Office Visit Note   Patient: Christina Byrd           Date of Birth: [birth year]           MRN: [id number] Visit Date: 04/06/2021              Requested by: Charlott Rakes, MD [place],  [place] [address] PCP: Charlott Rakes, MD   Assessment & Plan: Visit Diagnoses:  1. Adhesive capsulitis of right shoulder     Plan: Discussed with patient placing a cortisone injection subacromial space right shoulder she defers.  Therefore recommend MRI to evaluate right shoulder for rotator cuff tear.  Have her back once the study is available.  In regards to the mild carpal tunnel syndrome on the right recommend Voltaren gel 2 g up to 4 times daily.   Follow-Up Instructions: Return After MRI.   Orders:  Orders Placed This Encounter  Procedures   MR SHOULDER RIGHT WO CONTRAST   No orders of the defined types were placed in this encounter.     Procedures: No procedures performed   Clinical Data: No additional findings.   Subjective: Chief Complaint  Patient presents with   Right Shoulder - Pain   Right Knee - Pain    HPI Christina Byrd returns today due to a new complaint of right shoulder pain.  She is similar we have not seen in some time she did last have EMG nerve conduction studies but did not follow-up.  I did review these with her today and this shows abnormal study with mild bilateral median nerve entrapment consistent with carpal tunnel syndrome.  She states that the left hand no longer goes numb but her right hand occasionally goes numb. States she has decreased range of motion in the right shoulder.   She takes diclofenac which helps some.  Does a lot of lifting on her job.  Notes the pains been ongoing for the past year. Radiographs right shoulder dated 03/25/2021 are reviewed the shoulder to be well located.  No significant arthritic changes.  No soft tissue abnormalities.  Glenohumeral joint is well-maintained.  Review of Systems See HPI    Objective: Vital Signs: LMP 12/17/2014   Physical Exam Constitutional:      Appearance: She is not ill-appearing or diaphoretic.  Pulmonary:     Effort: Pulmonary effort is normal.  Neurological:     Mental Status: She is alert and oriented to person, place, and time.    Ortho Exam Bilateral shoulder she has about a 5 strength with internal rotation against resistance.  Left shoulder external rotation is 5 out of 5 against resistance right shoulder is 3 out of 5 strength against resistance.  Forward flexion AB duction right shoulder only 90 degrees painful.  Difficult to assess for impingement testing.  She has tenderness about the right shoulder girdle no rashes skin lesions ulcerations about the shoulder.  Full range of motion of the left shoulder without pain. Specialty Comments:  No specialty comments available.  Imaging: No results found.   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   Pain and swelling of left ankle 12/02/2014   Tendinitis of right hip flexor 07/23/2014   Allergic rhinitis 05/13/2014   GERD (gastroesophageal reflux disease) 04/01/2014   Tendonitis, Achilles, right 04/01/2014   Osteoarthritis of left knee 12/11/2013   Ex-smoker 11/09/2008   OBESITY 08/12/2006   HYPERTENSION, BENIGN ESSENTIAL 08/12/2006   Past Medical History:  Diagnosis Date   GASTROESOPHAGEAL REFLUX,  NO ESOPHAGITIS 04/21/2006   Qualifier: Diagnosis of  By: Benna Dunks     GERD (gastroesophageal reflux disease) Dx 1995   HTN (hypertension)     Family History  Problem Relation Age of Onset   CAD Mother     History reviewed. No pertinent surgical history. Social History   Occupational History   Not on file  Tobacco Use   Smoking status: Former    Types: Cigarettes    Quit date: 06/25/2012    Years since quitting: 8.8   Smokeless tobacco: Never  Vaping Use   Vaping Use: Never used  Substance and Sexual Activity   Alcohol use: Yes    Alcohol/week: 3.0 standard drinks    Types: 3 Standard drinks or equivalent per week    Comment: ADMITS TO DRINKING 3-4 BEERS/DAY   Drug use: No   Sexual activity: Not Currently

## 2021-04-07 NOTE — Addendum Note (Signed)
Addended by: Barbette Or on: 04/07/2021 09:35 AM   Modules accepted: Orders

## 2021-04-13 ENCOUNTER — Telehealth: Payer: Self-pay

## 2021-04-13 DIAGNOSIS — K120 Recurrent oral aphthae: Secondary | ICD-10-CM

## 2021-04-13 MED ORDER — DEXAMETHASONE 0.5 MG/5ML PO ELIX: 0.5000 mg | ORAL_SOLUTION | Freq: Three times a day (TID) | ORAL | 0 refills | Status: DC

## 2021-04-13 NOTE — Telephone Encounter (Signed)
Pt is requesting refill on mouthwash for gher dental infection.

## 2021-04-13 NOTE — Telephone Encounter (Signed)
Done

## 2021-04-14 NOTE — Telephone Encounter (Signed)
Pt was called and informed of medication being refilled. 

## 2021-04-28 ENCOUNTER — Other Ambulatory Visit: Payer: No Typology Code available for payment source

## 2021-04-30 ENCOUNTER — Ambulatory Visit: Payer: No Typology Code available for payment source | Admitting: Physician Assistant

## 2021-04-30 ENCOUNTER — Telehealth: Payer: Self-pay

## 2021-04-30 NOTE — Telephone Encounter (Signed)
Patient contacted to schedule mammogram.  ? ?RE: Mobile Mammo event located at: ? ?[email or web address]  ?      ?[address]    ?[place] [address]    ? ?Date: April 7th  ? ?Patient stated that she declines.  ?

## 2021-05-01 ENCOUNTER — Other Ambulatory Visit: Payer: Self-pay

## 2021-05-01 ENCOUNTER — Ambulatory Visit
Admission: RE | Admit: 2021-05-01 | Discharge: 2021-05-01 | Disposition: A | Discharge status: Home / Self Care | Payer: No Typology Code available for payment source | Source: Ambulatory Visit | Attending: Physician Assistant | Admitting: Physician Assistant

## 2021-05-01 DIAGNOSIS — M7501 Adhesive capsulitis of right shoulder: Secondary | ICD-10-CM

## 2021-05-04 ENCOUNTER — Other Ambulatory Visit: Payer: Self-pay

## 2021-05-04 ENCOUNTER — Ambulatory Visit: Payer: No Typology Code available for payment source | Admitting: Physician Assistant

## 2021-05-04 ENCOUNTER — Ambulatory Visit (INDEPENDENT_AMBULATORY_CARE_PROVIDER_SITE_OTHER): Payer: No Typology Code available for payment source | Admitting: Neurology

## 2021-05-04 ENCOUNTER — Ambulatory Visit: Payer: Self-pay

## 2021-05-04 ENCOUNTER — Encounter: Payer: Self-pay | Admitting: Neurology

## 2021-05-04 DIAGNOSIS — R569 Unspecified convulsions: Secondary | ICD-10-CM

## 2021-05-04 MED ORDER — LEVETIRACETAM 500 MG PO TABS: 500.0000 mg | ORAL_TABLET | Freq: Two times a day (BID) | ORAL | 3 refills | Status: DC

## 2021-05-04 NOTE — Telephone Encounter (Signed)
3rd call attempted to contact patient. No answer, no voicemail box available to leave message. Patient requesting medication for MRI due to claustrophobia. Unknown when MRI will be scheduled. Please advise .  ?

## 2021-05-04 NOTE — Telephone Encounter (Signed)
The patient has previously attempted to have an MRI on Friday  ? ?The patient was unable to complete their imaging on Friday due to concerns for their claustrophobia  ? ?The patient was directed to request a prescription for something to calm them down before their new attempt at an MRI  ? ?Please contact the patient further  ? ?Voice mailbox not set up, unable to leave message. ?

## 2021-05-04 NOTE — Progress Notes (Unsigned)
NEUROLOGY FOLLOW UP OFFICE NOTE  Christina Byrd [id number] 13-Nov-1965  HISTORY OF PRESENT ILLNESS: I had the pleasure of seeing Christina Byrd in follow-up in the neurology clinic on 05/04/2021.  The patient was last seen on *** and is accompanied by *** today.  Records and images were personally reviewed where available.  ***.  Blue moon a pause, she says not sz, from hot flashes, says she is still alert with what is going on Keppra $RemoveB'500mg'ybSbkJAC$  BID Feels better Sleeping good Cut back a lot, 1-2 beer, 4-5 shots   Vit B12 198 Folate 2.7 Vit B1 <6 vitamin B12 1031mcg daily supplement, vitamin B1 $RemoveBe'100mg'CuhmYaJcL$  daily, and folic acid $RemoveBe'1mg'zmRbHQhvv$  daily  I had the pleasure of seeing Christina Byrd in follow-up in the neurology clinic on 07/11/2020.  The patient was last seen 2 months ago for seizures suggestive of temporal lobe epilepsy. She is again accompanied by her significant other who helps supplement the history today.  Records and images were personally reviewed where available. She has been seizure-free since May 2020 on Levetiracetam $RemoveBeforeDE'500mg'bmsbKxpNqKWYtjK$  BID. She presents for an earlier visit due to new symptoms. She called our office today report that she was prescribed Febuxostat in December 2021 which made her not able to walk for 4 days. She stopped the medication but now her body jumps. She did not report these symptoms on her visit 2 months ago, she mostly reported occasional left shoulder muscle spasms when lifting a lot. When asked about these symptoms, she reports symptoms over the left shoulder, it feels like someone grabs and squeezer her shoulder. She also describes times where she would be holding something in her left hand and would drop things. She has paresthesias in her hands, like sticking with a lot of pins. She wakes up with with both hands so tight and she tries to wake them up. She has occasional neck pain. She had an NCV in 12/2019 with mild bilateral carpal tunnel syndrome noted. She is unsure  about her allopurinol dose, she has a bottle for $RemoveBe'100mg'jTbxnphPi$  and $Re'300mg'xMI$  tablets and does not know which one to take.    History on Initial Assessment 11/18/2017: This is a 56 year old right-handed woman with a history of alcohol abuse, diabetes, hypertension, presenting for evaluation of recurrent seizures. The first seizure occurred in May 2018, she had a seizure in her sleep, then had another one before EMS arrived. Her significant other states she was in a daze and not responding in between seizures, then had another en route to the hospital. Per ER notes, she had 4 seizures that day. No focal weakness. Her CK was elevated, UDS positive for cocaine. 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 EEG normal. 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 She was seizure-free for almost a year until July 2019 when she had another convulsion. She denies any prior warning symptoms, waking up in the ambulance. At that time, her potassium and magnesium levels were very low. UDS negative. She was discharged home on Keppra but had not been taking it because she would have nausea and vomiting taking it together with her other medications. She had a third seizure on 11/10/2017, her significant other reports that this also occurred in sleep, she had heavy breathing with urinary incontinence. She would be confused after, no focal weakness. She was discharged home on Keppra $RemoveB'500mg'vzFvRQIi$  BID which she is tolerating without side effects  now that she is taking medications separately. She has occasional episodes where she smells an odd/terrible smell like blood or trash. Her significant other denies any staring/unresponsive episodes except in between the seizures. She denies any gaps in time, rising epigastric sensation, focal numbness/tingling/weakness, deja vu sensations, or myoclonic jerks. She has a history of heavy alcohol use, she usually drinks 2 beers and 4 shots of liquor  every night, at most 3 12-oz beers and 6 shots of liquor. She recalls that the night prior to the seizures she had more alcohol and was up until 3am. She denies any headaches, dizziness, vision changes, neck/back pain, bowel/bladder dysfunction except for occasional constipation. Her father had seizures due to alcohol. Otherwise she had a normal birth and early development.  There is no history of febrile convulsions, CNS infections such as meningitis/encephalitis, significant traumatic brain injury, neurosurgical procedures.  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 EEG in 2018 was normal.  PAST MEDICAL HISTORY: Past Medical History:  Diagnosis Date   GASTROESOPHAGEAL REFLUX, NO ESOPHAGITIS 04/21/2006   Qualifier: Diagnosis of  By: Benna Dunks     GERD (gastroesophageal reflux disease) Dx 1995   HTN (hypertension)     MEDICATIONS: Current Outpatient Medications on File Prior to Visit  Medication Sig Dispense Refill   amLODipine (NORVASC) 10 MG tablet Take 1 tablet (10 mg total) by mouth daily. 90 tablet 1   aspirin EC 81 MG tablet Take 1 tablet (81 mg total) by mouth daily.     atorvastatin (LIPITOR) 20 MG tablet Take 1 tablet (20 mg total) by mouth daily. 90 tablet 1   Blood Glucose Monitoring Suppl (TRUE METRIX METER) DEVI 1 kit by Does not apply route 3 (three) times daily. CHECK BLOOD SUGAR UP TO 3 TIMES DAILY. E11.9 1 each 0   cloNIDine (CATAPRES) 0.1 MG tablet TAKE 1 TABLET(0.1 MG) BY MOUTH AT BEDTIME AS NEEDED FOR HOT FLASHES 30 tablet 3   dexamethasone 0.5 MG/5ML elixir Take 5 mLs (0.5 mg total) by mouth 3 (three) times daily. Swish for 5 minutes and spit out 100 mL 0   diclofenac (VOLTAREN) 75 MG EC tablet TAKE 1 TABLET(75 MG) BY MOUTH TWICE DAILY 570 tablet 1   folic acid (FOLVITE) 1 MG tablet Take 1 tablet (1 mg total) by mouth daily. 30 tablet 11   glucose blood (TRUE METRIX BLOOD GLUCOSE  TEST) test strip Use as instructed 100 each 12   levETIRAcetam (KEPPRA) 500 MG tablet Take 1 tablet (500 mg total) by mouth 2 (two) times daily. 60 tablet 11   magnesium oxide (MAG-OX) 400 MG tablet Take 1 tablet (400 mg total) by mouth daily. 30 tablet 3   pantoprazole (PROTONIX) 40 MG tablet Take 1 tablet (40 mg total) by mouth daily. 30 tablet 3   predniSONE (DELTASONE) 20 MG tablet Take 1 tablet (20 mg total) by mouth daily with breakfast. 5 tablet 0   thiamine (VITAMIN B-1) 100 MG tablet Take 1 tablet (100 mg total) by mouth daily. 30 tablet 11   TRUEplus Lancets 28G MISC SMARTSIG:Topical 1 to 3 Times Daily 100 each 3   vitamin B-12 (CYANOCOBALAMIN) 1000 MCG tablet Take 1 tablet (1,000 mcg total) by mouth daily. 30 tablet 11   [DISCONTINUED] metFORMIN (GLUCOPHAGE) 500 MG tablet Take 0.5 tablets (250 mg total) by mouth daily with breakfast. 45 tablet 6   No current facility-administered medications on file prior  to visit.    ALLERGIES: Allergies  Allergen Reactions   Losartan Potassium     Rash   Levaquin [Levofloxacin In D5w] Rash   Ace Inhibitors Cough    REACTION: cough   Codeine Nausea Only   Cefepime Rash    09/04/12 pm Patient started to break out with small macules after IV Vanco infusion, then macules increased in size after starting cefepime infusion.   Vancomycin Rash    09/04/12 pm Patient started to break out with small macules after IV Vanco infusion, then macules increased in size after starting cefepime infusion.    FAMILY HISTORY: Family History  Problem Relation Age of Onset   CAD Mother     SOCIAL HISTORY: Social History   Socioeconomic History   Marital status: Single    Spouse name: Not on file   Number of children: Not on file   Years of education: Not on file   Highest education level: Not on file  Occupational History   Not on file  Tobacco Use   Smoking status: Former    Types: Cigarettes    Quit date: 06/25/2012    Years since quitting: 8.8    Smokeless tobacco: Never  Vaping Use   Vaping Use: Never used  Substance and Sexual Activity   Alcohol use: Yes    Alcohol/week: 3.0 standard drinks    Types: 3 Standard drinks or equivalent per week    Comment: ADMITS TO DRINKING 3-4 BEERS/DAY   Drug use: No   Sexual activity: Not Currently  Other Topics Concern   Not on file  Social History Narrative   Pt lives in single story home with her partner   Has 1 son   5 grandchildren   10th grade education   Works at [employer]    Right handed   Social Determinants of Health   Financial Resource Strain: Not on file  Food Insecurity: Not on file  Transportation Needs: Not on file  Physical Activity: Not on file  Stress: Not on file  Social Connections: Not on file  Intimate Partner Violence: Not on file     PHYSICAL EXAM: There were no vitals filed for this visit. General: No acute distress Head:  Normocephalic/atraumatic Skin/Extremities: No rash, no edema Neurological Exam: alert and oriented to person, place, and time. No aphasia or dysarthria. Fund of knowledge is appropriate.  Recent and remote memory are intact.  Attention and concentration are normal.   Cranial nerves: Pupils equal, round. Extraocular movements intact with no nystagmus. Visual fields full.  No facial asymmetry.  Motor: Bulk and tone normal, muscle strength 5/5 throughout with no pronator drift.   Finger to nose testing intact.  Gait narrow-based and steady, able to tandem walk adequately.  Romberg negative.   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 She has been seizure-free since May 2020 on Levetiracetam $RemoveBeforeDE'500mg'BDKEnPrvUWMiftF$  BID. She presents for an earlier visit reporting body jerks since taking a gout medication in December. She has stopped medication, and on further questioning reports the symptoms are spasms  on the left shoulder, she has limited left shoulder abduction, reporting pain. Discussed that this is unrelated to the gout medication that she has stopped and that she should follow-up with Ortho for left shoulder. Discussed paresthesias due to bilateral carpal tunnel advised to use wrist splint. Neuropathy labs will be ordered, discussed  how alcohol can also affect nerve function. Discuss medication concerns with PCP. Follow-up as scheduled in March 2023, she knows to call for any changes.     Thank you for allowing me to participate in *** care.  Please do not hesitate to call for any questions or concerns.  The duration of this appointment visit was *** minutes of face-to-face time with the patient.  Greater than 50% of this time was spent in counseling, explanation of diagnosis, planning of further management, and coordination of care.   Ellouise Newer, M.D.   CC: ***

## 2021-05-04 NOTE — Telephone Encounter (Signed)
Summary: rx req / behavioral health  ? The patient has previously attempted to have an MRI on Friday  ? ?The patient was unable to complete their imaging on Friday due to concerns for their claustrophobia  ? ?The patient was directed to request a prescription for something to calm them down before their new attempt at an MRI  ? ?Please contact the patient further  ? ?----- Message from Leonia Reeves sent at 05/04/2021 12:11 PM EDT -----  ?The patient has previously attempted to have an MRI on Friday  ? ?The patient was unable to complete their imaging on Friday due to concerns for their claustrophobia  ? ?The patient was directed to request a prescription for something to calm them down before their new attempt at an MRI  ? ?Please contact the patient further   ?  ? ? ?2nd call to patient to review medication request. No answer, mail box has not been set up . Unable to leave message.  ?

## 2021-05-04 NOTE — Patient Instructions (Signed)
Good to see you. Continue Keppra 500mg  twice a day. Follow-up in 1 year, call for any changes. ? ? ?Seizure Precautions: ?1. If medication has been prescribed for you to prevent seizures, take it exactly as directed.  Do not stop taking the medicine without talking to your doctor first, even if you have not had a seizure in a long time.  ? ?2. Avoid activities in which a seizure would cause danger to yourself or to others.  Don't operate dangerous machinery, swim alone, or climb in high or dangerous places, such as on ladders, roofs, or girders.  Do not drive unless your doctor says you may. ? ?3. If you have any warning that you may have a seizure, lay down in a safe place where you can't hurt yourself.   ? ?4.  No driving for 6 months from last seizure, as per [facility].   Please refer to the following link on the [employer]ile[employer] of [employer]'s website for more information: http://www.epilepsyfoundation.org/answerplace/Social/driving/drivingu.cfm  ? ?5.  Maintain good sleep hygiene. Continue with alcohol reduction.  ? ?6.  Contact your doctor if you have any problems that may be related to the medicine you are taking. ? ?7.  Call 911 and bring the patient back to the ED if: ?      ? A.  The seizure lasts longer than 5 minutes.      ? B.  The patient doesn't awaken shortly after the seizure ? C.  The patient has new problems such as difficulty seeing, speaking or moving ? D.  The patient was injured during the seizure ? E.  The patient has a temperature over 102 F (39C) ? F.  The patient vomited and now is having trouble breathing ?      ? ?

## 2021-05-05 NOTE — Telephone Encounter (Signed)
MRI was ordered by Ortho, patient states she needs medication to calm her nerves. ?

## 2021-05-06 ENCOUNTER — Telehealth: Payer: Self-pay | Admitting: Family Medicine

## 2021-05-06 ENCOUNTER — Other Ambulatory Visit: Payer: Self-pay

## 2021-05-06 MED ORDER — LORAZEPAM 0.5 MG PO TABS
ORAL_TABLET | ORAL | 0 refills | Status: DC
  Filled 2021-05-06 – 2021-05-22 (×2): qty 1, 1d supply, fill #0

## 2021-05-06 NOTE — Telephone Encounter (Signed)
The patient has made an additional call about this request for a medication  ? ?Please contact further when available  ?

## 2021-05-06 NOTE — Telephone Encounter (Signed)
Pt was called and informed of medication being sent to pharmacy. 

## 2021-05-06 NOTE — Telephone Encounter (Signed)
I have sent a prescription for Ativan to her pharmacy to be taken 30 minutes prior to MRI ?

## 2021-05-06 NOTE — Telephone Encounter (Signed)
Will route to Alycia. A rx would have to be reviewed for approval by Dr. Alvis Lemmings in this case. ?

## 2021-05-06 NOTE — Telephone Encounter (Signed)
Copied from CRM [id number]. Topic: General - Other ?>> May 04, 2021 12:01 PM Christina Byrd A wrote: ?Reason for CRM: The patient was unable to complete their MRI on Friday due to the stress of the procedure  ? ?The patient was directed to contact their PCP and request a prescription to help them relax so that the imaging can be done ? ?The patient would like to speak with Alycia further about this matter  ? ?Please contact further when possible ?

## 2021-05-07 NOTE — Telephone Encounter (Signed)
Duplicate message pt has been informed of medication being sent to pharmacy. ?

## 2021-05-13 ENCOUNTER — Telehealth: Payer: Self-pay | Admitting: *Deleted

## 2021-05-13 ENCOUNTER — Other Ambulatory Visit: Payer: Self-pay

## 2021-05-13 NOTE — Telephone Encounter (Signed)
Copied from [place] [id number]. Topic: General - Other ?>> May 12, 2021 11:29 AM Yvette Rack wrote: ?Reason for CRM: Pt requests a call back from [place]. Cb# [phone] ?

## 2021-05-14 NOTE — Telephone Encounter (Signed)
Call placed to patient and no VM is currently set up to leave a message. ?

## 2021-05-18 ENCOUNTER — Other Ambulatory Visit: Payer: Self-pay

## 2021-05-18 ENCOUNTER — Encounter: Payer: Self-pay | Admitting: Family Medicine

## 2021-05-18 ENCOUNTER — Ambulatory Visit: Payer: No Typology Code available for payment source | Attending: Family Medicine | Admitting: Family Medicine

## 2021-05-18 VITALS — BP 103/70 | HR 83 | Ht 62.0 in | Wt 144.8 lb

## 2021-05-18 DIAGNOSIS — R21 Rash and other nonspecific skin eruption: Secondary | ICD-10-CM

## 2021-05-18 DIAGNOSIS — N951 Menopausal and female climacteric states: Secondary | ICD-10-CM

## 2021-05-18 DIAGNOSIS — S20369A Insect bite (nonvenomous) of unspecified front wall of thorax, initial encounter: Secondary | ICD-10-CM

## 2021-05-18 DIAGNOSIS — W57XXXA Bitten or stung by nonvenomous insect and other nonvenomous arthropods, initial encounter: Secondary | ICD-10-CM

## 2021-05-18 MED ORDER — HYDROCORTISONE 0.5 % EX CREA
1.0000 "application " | TOPICAL_CREAM | Freq: Two times a day (BID) | CUTANEOUS | 0 refills | Status: DC
  Filled 2021-05-18: qty 30, 15d supply, fill #0

## 2021-05-18 MED ORDER — DOXYCYCLINE HYCLATE 100 MG PO TABS
100.0000 mg | ORAL_TABLET | Freq: Two times a day (BID) | ORAL | 0 refills | Status: DC
  Filled 2021-05-18: qty 20, 10d supply, fill #0

## 2021-05-18 MED ORDER — VENLAFAXINE HCL ER 75 MG PO CP24
75.0000 mg | ORAL_CAPSULE | Freq: Every day | ORAL | 6 refills | Status: DC
  Filled 2021-05-18: qty 30, 30d supply, fill #0

## 2021-05-18 NOTE — Progress Notes (Signed)
Red spots on body ?Discuss medication for hot flashes. ?

## 2021-05-18 NOTE — Patient Instructions (Signed)
Menopause Menopause is the normal time of a woman's life when menstrual periods stop completely. It marks the natural end to a woman's ability to become pregnant. It can be defined as the absence of a menstrual period for 12 months without another medical cause. The transition to menopause (perimenopause) most often happens between the ages of 45 and 55, and can last for many years. During perimenopause, hormone levels change in your body, which can cause symptoms and affect your health. Menopause may increase your risk for: Weakened bones (osteoporosis), which causes fractures. Depression. Hardening and narrowing of the arteries (atherosclerosis), which can cause heart attacks and strokes. What are the causes? This condition is usually caused by a natural change in hormone levels that happens as you get older. The condition may also be caused by changes that are not natural, including: Surgery to remove both ovaries (surgical menopause). Side effects from some medicines, such as chemotherapy used to treat cancer (chemical menopause). What increases the risk? This condition is more likely to start at an earlier age if you have certain medical conditions or have undergone treatments, including: A tumor of the pituitary gland in the brain. A disease that affects the ovaries and hormones. Certain cancer treatments, such as chemotherapy or hormone therapy, or radiation therapy on the pelvis. Heavy smoking and excessive alcohol use. Family history of early menopause. This condition is also more likely to develop earlier in women who are very thin. What are the signs or symptoms? Symptoms of this condition include: Hot flashes. Irregular menstrual periods. Night sweats. Changes in feelings about sex. This could be a decrease in sex drive or an increased discomfort around your sexuality. Vaginal dryness and thinning of the vaginal walls. This may cause painful sex. Dryness of the skin and  development of wrinkles. Headaches. Problems sleeping (insomnia). Mood swings or irritability. Memory problems. Weight gain. Hair growth on the face and chest. Bladder infections or problems with urinating. How is this diagnosed? This condition is diagnosed based on your medical history, a physical exam, your age, your menstrual history, and your symptoms. Hormone tests may also be done. How is this treated? In some cases, no treatment is needed. You and your health care provider should make a decision together about whether treatment is necessary. Treatment will be based on your individual condition and preferences. Treatment for this condition focuses on managing symptoms. Treatment may include: Menopausal hormone therapy (MHT). Medicines to treat specific symptoms or complications. Acupuncture. Vitamin or herbal supplements. Before starting treatment, make sure to let your health care provider know if you have a personal or family history of these conditions: Heart disease. Breast cancer. Blood clots. Diabetes. Osteoporosis. Follow these instructions at home: Lifestyle Do not use any products that contain nicotine or tobacco, such as cigarettes, e-cigarettes, and chewing tobacco. If you need help quitting, ask your health care provider. Get at least 30 minutes of physical activity on 5 or more days each week. Avoid alcoholic and caffeinated beverages, as well as spicy foods. This may help prevent hot flashes. Get 7-8 hours of sleep each night. If you have hot flashes, try: Dressing in layers. Avoiding things that may trigger hot flashes, such as spicy food, warm places, or stress. Taking slow, deep breaths when a hot flash starts. Keeping a fan in your home and office. Find ways to manage stress, such as deep breathing, meditation, or journaling. Consider going to group therapy with other women who are having menopause symptoms. Ask your health care   provider about recommended  group therapy meetings. Eating and drinking  Eat a healthy, balanced diet that contains whole grains, lean protein, low-fat dairy, and plenty of fruits and vegetables. Your health care provider may recommend adding more soy to your diet. Foods that contain soy include tofu, tempeh, and soy milk. Eat plenty of foods that contain calcium and vitamin D for bone health. Items that are rich in calcium include low-fat milk, yogurt, beans, almonds, sardines, broccoli, and kale. Medicines Take over-the-counter and prescription medicines only as told by your health care provider. Talk with your health care provider before starting any herbal supplements. If prescribed, take vitamins and supplements as told by your health care provider. General instructions  Keep track of your menstrual periods, including: When they occur. How heavy they are and how long they last. How much time passes between periods. Keep track of your symptoms, noting when they start, how often you have them, and how long they last. Use vaginal lubricants or moisturizers to help with vaginal dryness and improve comfort during sex. Keep all follow-up visits. This is important. This includes any group therapy or counseling. Contact a health care provider if: You are still having menstrual periods after age 55. You have pain during sex. You have not had a period for 12 months and you develop vaginal bleeding. Get help right away if you have: Severe depression. Excessive vaginal bleeding. Pain when you urinate. A fast or irregular heartbeat (palpitations). Severe headaches. Abdominal pain or severe indigestion. Summary Menopause is a normal time of life when menstrual periods stop completely. It is usually defined as the absence of a menstrual period for 12 months without another medical cause. The transition to menopause (perimenopause) most often happens between the ages of 45 and 55 and can last for several years. Symptoms  can be managed through medicines, lifestyle changes, and complementary therapies such as acupuncture. Eat a balanced diet that is rich in nutrients to promote bone health and heart health and to manage symptoms during menopause. This information is not intended to replace advice given to you by your health care provider. Make sure you discuss any questions you have with your health care provider. Document Revised: 11/09/2019 Document Reviewed: 07/26/2019 Elsevier Patient Education  2022 [employer]  

## 2021-05-18 NOTE — Progress Notes (Signed)
? ?Subjective:  ?Patient ID: Christina Byrd, female    DOB: March 15, 1965  Age: 56 y.o. MRN: [id number] ? ?CC: Hypertension ? ? ?HPI ?Christina Byrd is a 56 y.o. year old female with a history of type 2 diabetes mellitus (A1c 5.5), GERD, Seizures , Gout here for an acute visit. ?She had a visit for chronic disease management in 02/2021. ? ?Interval History: ?She pulled a tick off her skin after being in the woods l4 days ago ?She noticed 2 lesions on her upper chest wall which itch. She has had no fever or joint aches ? ?States she breaks out in a sweat after eating.  Currently on clonidine which she takes at night for vasomotor menopausal symptoms but is requesting something to take during the day. ?Past Medical History:  ?Diagnosis Date  ? GASTROESOPHAGEAL REFLUX, NO ESOPHAGITIS 04/21/2006  ? Qualifier: Diagnosis of  By: Benna Dunks    ? GERD (gastroesophageal reflux disease) Dx 1995  ? HTN (hypertension)   ? ? ?No past surgical history on file. ? ?Family History  ?Problem Relation Age of Onset  ? CAD Mother   ? ? ?Social History  ? ?Socioeconomic History  ? Marital status: Single  ?  Spouse name: Not on file  ? Number of children: Not on file  ? Years of education: Not on file  ? Highest education level: Not on file  ?Occupational History  ? Not on file  ?Tobacco Use  ? Smoking status: Former  ?  Types: Cigarettes  ?  Quit date: 06/25/2012  ?  Years since quitting: 8.9  ? Smokeless tobacco: Never  ?Vaping Use  ? Vaping Use: Never used  ?Substance and Sexual Activity  ? Alcohol use: Yes  ?  Alcohol/week: 3.0 standard drinks  ?  Types: 3 Standard drinks or equivalent per week  ?  Comment: ADMITS TO DRINKING 3-4 BEERS/DAY  ? Drug use: No  ? Sexual activity: Not Currently  ?Other Topics Concern  ? Not on file  ?Social History Narrative  ? Pt lives in single story home with her partner  ? Has 1 son  ? 5 grandchildren  ? 10th grade education  ? Works at [employer]   ? Right handed  ? ?Social Determinants of Health   ? ?Financial Resource Strain: Not on file  ?Food Insecurity: Not on file  ?Transportation Needs: Not on file  ?Physical Activity: Not on file  ?Stress: Not on file  ?Social Connections: Not on file  ? ? ?Allergies  ?Allergen Reactions  ? Losartan Potassium   ?  Rash  ? Levaquin [Levofloxacin In D5w] Rash  ? Ace Inhibitors Cough  ?  REACTION: cough  ? Codeine Nausea Only  ? Cefepime Rash  ?  09/04/12 pm Patient started to break out with small macules after IV Vanco infusion, then macules increased in size after starting cefepime infusion.  ? Vancomycin Rash  ?  09/04/12 pm Patient started to break out with small macules after IV Vanco infusion, then macules increased in size after starting cefepime infusion.  ? ? ?Outpatient Medications Prior to Visit  ?Medication Sig Dispense Refill  ? amLODipine (NORVASC) 10 MG tablet Take 1 tablet (10 mg total) by mouth daily. 90 tablet 1  ? aspirin EC 81 MG tablet Take 1 tablet (81 mg total) by mouth daily.    ? atorvastatin (LIPITOR) 20 MG tablet Take 1 tablet (20 mg total) by mouth daily. 90 tablet 1  ? Blood Glucose  Monitoring Suppl (TRUE METRIX METER) DEVI 1 kit by Does not apply route 3 (three) times daily. CHECK BLOOD SUGAR UP TO 3 TIMES DAILY. E11.9 1 each 0  ? cloNIDine (CATAPRES) 0.1 MG tablet TAKE 1 TABLET(0.1 MG) BY MOUTH AT BEDTIME AS NEEDED FOR HOT FLASHES 30 tablet 3  ? dexamethasone 0.5 MG/5ML elixir Take 5 mLs (0.5 mg total) by mouth 3 (three) times daily. Swish for 5 minutes and spit out 100 mL 0  ? diclofenac (VOLTAREN) 75 MG EC tablet TAKE 1 TABLET(75 MG) BY MOUTH TWICE DAILY 180 tablet 1  ? folic acid (FOLVITE) 1 MG tablet Take 1 tablet (1 mg total) by mouth daily. 30 tablet 11  ? glucose blood (TRUE METRIX BLOOD GLUCOSE TEST) test strip Use as instructed 100 each 12  ? levETIRAcetam (KEPPRA) 500 MG tablet Take 1 tablet (500 mg total) by mouth 2 (two) times daily. 180 tablet 3  ? LORazepam (ATIVAN) 0.5 MG tablet take 1 tab by mouth once 30 minutes before MRI 1  tablet 0  ? magnesium oxide (MAG-OX) 400 MG tablet Take 1 tablet (400 mg total) by mouth daily. 30 tablet 3  ? pantoprazole (PROTONIX) 40 MG tablet Take 1 tablet (40 mg total) by mouth daily. 30 tablet 3  ? predniSONE (DELTASONE) 20 MG tablet Take 1 tablet (20 mg total) by mouth daily with breakfast. 5 tablet 0  ? thiamine (VITAMIN B-1) 100 MG tablet Take 1 tablet (100 mg total) by mouth daily. 30 tablet 11  ? TRUEplus Lancets 28G MISC SMARTSIG:Topical 1 to 3 Times Daily 100 each 3  ? vitamin B-12 (CYANOCOBALAMIN) 1000 MCG tablet Take 1 tablet (1,000 mcg total) by mouth daily. 30 tablet 11  ? ?No facility-administered medications prior to visit.  ? ? ? ?ROS ?Review of Systems  ?Constitutional:  Negative for activity change, appetite change and fatigue.  ?HENT:  Negative for congestion, sinus pressure and sore throat.   ?Eyes:  Negative for visual disturbance.  ?Respiratory:  Negative for cough, chest tightness, shortness of breath and wheezing.   ?Cardiovascular:  Negative for chest pain and palpitations.  ?Gastrointestinal:  Negative for abdominal distention, abdominal pain and constipation.  ?Endocrine: Negative for polydipsia.  ?Genitourinary:  Negative for dysuria and frequency.  ?Musculoskeletal:  Negative for arthralgias and back pain.  ?Skin:   ?     See HPI  ?Neurological:  Negative for tremors, light-headedness and numbness.  ?Hematological:  Does not bruise/bleed easily.  ?Psychiatric/Behavioral:  Negative for agitation and behavioral problems.   ? ?Objective:  ?BP 103/70   Pulse 83   Ht $R'5\' 2"'YP$  (1.575 m)   Wt 144 lb 12.8 oz (65.7 kg)   LMP 12/17/2014   SpO2 98%   BMI 26.48 kg/m?  ? ? ?  05/18/2021  ?  4:32 PM 05/04/2021  ?  3:39 PM 03/24/2021  ? 10:10 AM  ?BP/Weight  ?Systolic BP 387 564 332  ?Diastolic BP 70 70 74  ?Wt. (Lbs) 144.8 142 141  ?BMI 26.48 kg/m2 25.97 kg/m2 25.79 kg/m2  ? ? ? ? ?Physical Exam ?Constitutional:   ?   Appearance: She is well-developed.  ?Cardiovascular:  ?   Rate and Rhythm:  Normal rate.  ?   Heart sounds: Normal heart sounds. No murmur heard. ?Pulmonary:  ?   Effort: Pulmonary effort is normal.  ?   Breath sounds: Normal breath sounds. No wheezing or rales.  ?Chest:  ?   Chest wall: No tenderness.  ?Abdominal:  ?  General: Bowel sounds are normal. There is no distension.  ?   Palpations: Abdomen is soft. There is no mass.  ?   Tenderness: There is no abdominal tenderness.  ?Musculoskeletal:     ?   General: Normal range of motion.  ?   Right lower leg: No edema.  ?   Left lower leg: No edema.  ?Skin: ?   Comments: Single nodule on either side of right and left anterior thoracic region with lesion on the left seeming more like a fluid-filled lesion with surrounding hyperpigmentation.  ?Neurological:  ?   Mental Status: She is alert and oriented to person, place, and time.  ?Psychiatric:     ?   Mood and Affect: Mood normal.  ? ? ? ?  Latest Ref Rng & Units 03/24/2021  ? 11:15 AM 11/18/2020  ? 11:15 AM 01/02/2020  ? 11:00 AM  ?CMP  ?Glucose 70 - 99 mg/dL 75   75   83    ?BUN 6 - 24 mg/dL $Remove'11   19   17    'IgDxnFl$ ?Creatinine 0.57 - 1.00 mg/dL 0.92   0.98   1.17    ?Sodium 134 - 144 mmol/L 146   144   144    ?Potassium 3.5 - 5.2 mmol/L 4.7   4.1   4.8    ?Chloride 96 - 106 mmol/L 106   106   104    ?CO2 20 - 29 mmol/L $RemoveB'23   22   22    'bUwdtCKa$ ?Calcium 8.7 - 10.2 mg/dL 9.3   9.2   9.6    ?Total Protein 6.0 - 8.5 g/dL  7.3   7.1    ?Total Bilirubin 0.0 - 1.2 mg/dL  0.3   0.4    ?Alkaline Phos 44 - 121 IU/L  147   126    ?AST 0 - 40 IU/L  16   14    ?ALT 0 - 32 IU/L  10   10    ? ? ?Lipid Panel  ?   ?Component Value Date/Time  ? CHOL 171 11/18/2020 1115  ? TRIG 241 (H) 11/18/2020 1115  ? HDL 78 11/18/2020 1115  ? CHOLHDL 2.2 11/18/2020 1115  ? CHOLHDL 2.4 10/26/2012 1052  ? VLDL 46 (H) 10/26/2012 1052  ? LDLCALC 56 11/18/2020 1115  ? LDLDIRECT 153 (H) 01/30/2008 2146  ? ? ?CBC ?   ?Component Value Date/Time  ? WBC 5.8 03/24/2021 1115  ? WBC 8.7 11/10/2017 1144  ? RBC 4.15 03/24/2021 1115  ? RBC 4.36 11/10/2017  1144  ? HGB 12.8 03/24/2021 1115  ? HCT 38.3 03/24/2021 1115  ? PLT 270 03/24/2021 1115  ? MCV 92 03/24/2021 1115  ? MCH 30.8 03/24/2021 1115  ? MCH 29.4 11/10/2017 1144  ? MCHC 33.4 03/24/2021 1115  ? MCHC

## 2021-05-19 ENCOUNTER — Other Ambulatory Visit: Payer: Self-pay

## 2021-05-22 ENCOUNTER — Telehealth: Payer: Self-pay

## 2021-05-22 ENCOUNTER — Other Ambulatory Visit: Payer: Self-pay

## 2021-05-22 NOTE — Telephone Encounter (Signed)
Pt called office regarding her Ativan that was sent in on 05/06/2021. Pt states that she went to the pharmacy and medication was not ready. Pharmacy was contacted and I was informed that medication was put back due to patient not picking up the medication. Information was relayed to patient and she got upset and says that she is going to cancel the MRI. ?

## 2021-05-24 ENCOUNTER — Other Ambulatory Visit: Payer: No Typology Code available for payment source

## 2021-05-27 ENCOUNTER — Ambulatory Visit: Payer: No Typology Code available for payment source | Admitting: Physician Assistant

## 2021-06-14 ENCOUNTER — Other Ambulatory Visit: Payer: Self-pay | Admitting: Family Medicine

## 2021-06-14 DIAGNOSIS — R569 Unspecified convulsions: Secondary | ICD-10-CM

## 2021-06-16 NOTE — Telephone Encounter (Signed)
Requested medications are due for refill today.  no ? ?Requested medications are on the active medications list.  yes ? ?Last refill. 05/04/2021 #180 3 refills ? ?Future visit scheduled.   yes ? ?Notes to clinic.  Medication is not due for refill. Recent rx signed by Dr. Karel Jarvis. ? ? ? ?Requested Prescriptions  ?Pending Prescriptions Disp Refills  ? levETIRAcetam (KEPPRA) 500 MG tablet [Pharmacy Med Name: LEVETIRACETAM 500MG  TABLETS] 60 tablet   ?  Sig: TAKE 1 TABLET(500 MG) BY MOUTH TWICE DAILY  ?  ? Neurology:  Anticonvulsants - levetiracetam Failed - 06/14/2021 12:00 PM  ?  ?  Failed - Completed PHQ-2 or PHQ-9 in the last 360 days  ?  ?  Passed - Cr in normal range and within 360 days  ?  Creat  ?Date Value Ref Range Status  ?03/21/2014 1.00 0.50 - 1.10 mg/dL Final  ? ?Creatinine, Ser  ?Date Value Ref Range Status  ?03/24/2021 0.92 0.57 - 1.00 mg/dL Final  ? ?Creatinine, POC  ?Date Value Ref Range Status  ?03/25/2017 300 mg/dL Final  ?  ?  ?  ?  Passed - Valid encounter within last 12 months  ?  Recent Outpatient Visits   ? ?      ? 4 weeks ago Tick bite of front wall of thorax, unspecified location, initial encounter  ? [facility] [place], [place], [place]  ? 2 months ago Vasomotor symptoms due to menopause  ? [facility] [place], [place], [place]  ? 7 months ago Controlled type 2 diabetes mellitus without complication, without long-term current use of insulin (HCC)  ? [facility] [place], [place], [place]  ? 10 months ago Gastroesophageal reflux disease without esophagitis  ? [facility] [place], [place], [place]  ? 11 months ago   ? [facility] [place], [place], [place]  ? ?  ?  ?Future Appointments   ? ?        ? In 2 days [place], [place] [facility]  ? ?  ? ? ?  ?  ?  ?  ?

## 2021-06-18 ENCOUNTER — Ambulatory Visit: Payer: No Typology Code available for payment source | Admitting: Internal Medicine

## 2021-06-24 ENCOUNTER — Other Ambulatory Visit: Payer: Self-pay | Admitting: Family Medicine

## 2021-06-24 ENCOUNTER — Other Ambulatory Visit: Payer: Self-pay | Admitting: Physician Assistant

## 2021-06-24 DIAGNOSIS — K219 Gastro-esophageal reflux disease without esophagitis: Secondary | ICD-10-CM

## 2021-06-24 DIAGNOSIS — E119 Type 2 diabetes mellitus without complications: Secondary | ICD-10-CM

## 2021-06-24 DIAGNOSIS — M1A0610 Idiopathic chronic gout, right knee, without tophus (tophi): Secondary | ICD-10-CM

## 2021-06-25 ENCOUNTER — Other Ambulatory Visit: Payer: Self-pay | Admitting: Family Medicine

## 2021-06-25 NOTE — Telephone Encounter (Signed)
Requested medication (s) are due for refill today:   Yes ? ?Requested medication (s) are on the active medication list:   Yes ? ?Future visit scheduled:   Yes ? ? ?Last ordered: 06/25/2021 today 16 g, 1 refill ? ?Returned because a 90 day supply is being requested  ? ?Requested Prescriptions  ?Pending Prescriptions Disp Refills  ? fluticasone (FLONASE) 50 MCG/ACT nasal spray [Pharmacy Med Name: FLUTICASONE NASAL SP (120) RX] 48 g   ?  Sig: SHAKE LIQUID AND USE 2 SPRAYS IN EACH NOSTRIL DAILY  ?  ? Ear, Nose, and Throat: Nasal Preparations - Corticosteroids Passed - 06/25/2021  2:48 PM  ?  ?  Passed - Valid encounter within last 12 months  ?  Recent Outpatient Visits   ? ?      ? 1 month ago Tick bite of front wall of thorax, unspecified location, initial encounter  ? [facility] [place], [place], [place]  ? 3 months ago Vasomotor symptoms due to menopause  ? [facility] [place], [place], [place]  ? 7 months ago Controlled type 2 diabetes mellitus without complication, without long-term current use of insulin (HCC)  ? [facility] [place], [place], [place]  ? 10 months ago Gastroesophageal reflux disease without esophagitis  ? [facility] [place], [place], [place]  ? 12 months ago   ? [facility] [place], [place], [place]  ? ?  ?  ?Future Appointments   ? ?        ? In 1 week Marcine Matar, MD [facility]  ? ?  ? ? ?  ?  ?  ? ?

## 2021-07-03 ENCOUNTER — Ambulatory Visit: Payer: No Typology Code available for payment source | Attending: Internal Medicine | Admitting: Internal Medicine

## 2021-07-03 VITALS — BP 121/79 | HR 90 | Ht 62.0 in | Wt 144.8 lb

## 2021-07-03 DIAGNOSIS — N951 Menopausal and female climacteric states: Secondary | ICD-10-CM

## 2021-07-03 DIAGNOSIS — R21 Rash and other nonspecific skin eruption: Secondary | ICD-10-CM

## 2021-07-03 MED ORDER — VENLAFAXINE HCL ER 37.5 MG PO CP24: 37.5000 mg | ORAL_CAPSULE | Freq: Every day | ORAL | 3 refills | Status: DC

## 2021-07-03 MED ORDER — VENLAFAXINE HCL ER 37.5 MG PO CP24: 75.0000 mg | ORAL_CAPSULE | Freq: Every day | ORAL | 3 refills | Status: DC

## 2021-07-03 NOTE — Progress Notes (Signed)
Wants to change PCP from [facility]. ?Tick bite ?Sinus drainage. ?

## 2021-07-03 NOTE — Progress Notes (Signed)
? ? ?Patient ID: Christina Byrd, female    DOB: 04/25/1965  MRN: [id number] ? ?CC:  ? ? ?Subjective: ?Christina Byrd is a 55 y.o. female who presents for UC visit.  Female friend is with her. ?Her concerns today include:  ?female with a history of type 2 diabetes mellitus (A1c 5.5), HL, HTN, GERD, Seizures , Gout  ? ?Patient presents today requesting cream for spots on her upper anterior chest and over the abdomen.  She attributes them to tick bites for which she was seen by her PCP several weeks ago.  She was treated with doxycycline.  She was also prescribed over-the-counter hydrocortisone cream.  Patient states she never got the cream.  Prescription was sent to our pharmacy but she uses [facility].  She tells me that the spots no longer itch and have dried up.  ? ?She is requesting a pill to take to decrease hot flashes during the day.  She takes clonidine at night for hot flashes.  She is also supposed to be on Effexor XR 75 mg daily for hot flashes.  However she has her medication bottles with her today and she does not have the Effexor.  Again she request that prescription be sent to [place] and not our pharmacy.   ?Patient Active Problem List  ? Diagnosis Date Noted  ? Pharyngoesophageal dysphagia 03/28/2020  ? Cervicalgia 03/28/2020  ? Hot flashes 01/30/2020  ? 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 Hypomagnesemia 03/14/2018  ? Type 2 diabetes mellitus ([place]) 09/13/2016  ? Depression 07/08/2016  ? Seizure ([place]) 07/08/2016  ? Cocaine use   ? Hot flash, menopausal 06/22/2016  ? Intertrigo 06/22/2016  ? Gout of left ankle 10/09/2015  ? Left hip pain 09/02/2015  ? EtOH dependence ([place]) 09/02/2015  ? Colonoscopy refused 09/02/2015  ? Hyperuricemia 05/27/2015  ? 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 Pain and swelling of left ankle 12/02/2014  ? Tendinitis of right hip flexor 07/23/2014  ? Allergic rhinitis 05/13/2014  ? GERD (gastroesophageal reflux  disease) 04/01/2014  ? Tendonitis, Achilles, right 04/01/2014  ? Osteoarthritis of left knee 12/11/2013  ? Ex-smoker 11/09/2008  ? OBESITY 08/12/2006  ? HYPERTENSION, BENIGN ESSENTIAL 08/12/2006  ?  ? ?Current Outpatient Medications on File Prior to Visit  ?Medication Sig Dispense Refill  ? amLODipine (NORVASC) 10 MG tablet Take 1 tablet (10 mg total) by mouth daily. 90 tablet 1  ? aspirin EC 81 MG tablet Take 1 tablet (81 mg total) by mouth daily.    ? atorvastatin (LIPITOR) 20 MG tablet TAKE 1 TABLET(20 MG) BY MOUTH DAILY 90 tablet 1  ? Blood Glucose Monitoring Suppl (TRUE METRIX METER) DEVI 1 kit by Does not apply route 3 (three) times daily. CHECK BLOOD SUGAR UP TO 3 TIMES DAILY. E11.9 1 each 0  ? cloNIDine (CATAPRES) 0.1 MG tablet TAKE 1 TABLET(0.1 MG) BY MOUTH AT BEDTIME AS NEEDED FOR HOT FLASHES 30 tablet 3  ? dexamethasone 0.5 MG/5ML elixir Take 5 mLs (0.5 mg total) by mouth 3 (three) times daily. Swish for 5 minutes and spit out 100 mL 0  ? diclofenac (VOLTAREN) 75 MG EC tablet TAKE 1 TABLET(75 MG) BY MOUTH TWICE DAILY 180 tablet 1  ? fluticasone (FLONASE) 50 MCG/ACT nasal spray USE 2 SPRAYS IN EACH NOSTRIL DAILY 16 g 1  ? folic acid (FOLVITE) 1 MG tablet Take 1 tablet (1 mg total) by mouth daily. 30 tablet 11  ? glucose  blood (TRUE METRIX BLOOD GLUCOSE TEST) test strip Use as instructed 100 each 12  ? levETIRAcetam (KEPPRA) 500 MG tablet Take 1 tablet (500 mg total) by mouth 2 (two) times daily. 180 tablet 3  ? LORazepam (ATIVAN) 0.5 MG tablet take 1 tab by mouth once 30 minutes before MRI 1 tablet 0  ? magnesium oxide (MAG-OX) 400 (240 Mg) MG tablet TAKE 1 TABLET BY MOUTH EVERY DAY 90 tablet 0  ? pantoprazole (PROTONIX) 40 MG tablet Take 1 tablet (40 mg total) by mouth daily. 30 tablet 3  ? predniSONE (DELTASONE) 20 MG tablet Take 1 tablet (20 mg total) by mouth daily with breakfast. 5 tablet 0  ? thiamine (VITAMIN B-1) 100 MG tablet Take 1 tablet (100 mg total) by mouth daily. 30 tablet 11  ? tiZANidine  (ZANAFLEX) 4 MG tablet TAKE 1 TABLET BY MOUTH EVERY 8 HOURS AS NEEDED MUSCLE SPAMS 120 tablet 1  ? TRUEplus Lancets 28G MISC SMARTSIG:Topical 1 to 3 Times Daily 100 each 3  ? vitamin B-12 (CYANOCOBALAMIN) 1000 MCG tablet Take 1 tablet (1,000 mcg total) by mouth daily. 30 tablet 11  ? [DISCONTINUED] metFORMIN (GLUCOPHAGE) 500 MG tablet Take 0.5 tablets (250 mg total) by mouth daily with breakfast. 45 tablet 6  ? ?No current facility-administered medications on file prior to visit.  ? ? ?Allergies  ?Allergen Reactions  ? Losartan Potassium   ?  Rash  ? Levaquin [Levofloxacin In D5w] Rash  ? Ace Inhibitors Cough  ?  REACTION: cough  ? Codeine Nausea Only  ? Cefepime Rash  ?  09/04/12 pm Patient started to break out with small macules after IV Vanco infusion, then macules increased in size after starting cefepime infusion.  ? Vancomycin Rash  ?  09/04/12 pm Patient started to break out with small macules after IV Vanco infusion, then macules increased in size after starting cefepime infusion.  ? ? ?Social History  ? ?Socioeconomic History  ? Marital status: Single  ?  Spouse name: Not on file  ? Number of children: Not on file  ? Years of education: Not on file  ? Highest education level: Not on file  ?Occupational History  ? Not on file  ?Tobacco Use  ? Smoking status: Former  ?  Types: Cigarettes  ?  Quit date: 06/25/2012  ?  Years since quitting: 9.0  ? Smokeless tobacco: Never  ?Vaping Use  ? Vaping Use: Never used  ?Substance and Sexual Activity  ? Alcohol use: Yes  ?  Alcohol/week: 3.0 standard drinks  ?  Types: 3 Standard drinks or equivalent per week  ?  Comment: ADMITS TO DRINKING 3-4 BEERS/DAY  ? Drug use: No  ? Sexual activity: Not Currently  ?Other Topics Concern  ? Not on file  ?Social History Narrative  ? Pt lives in single story home with her partner  ? Has 1 son  ? 5 grandchildren  ? 10th grade education  ? Works at [employer]   ? Right handed  ? ?Social Determinants of Health  ? ?Financial Resource  Strain: Not on file  ?Food Insecurity: Not on file  ?Transportation Needs: Not on file  ?Physical Activity: Not on file  ?Stress: Not on file  ?Social Connections: Not on file  ?Intimate Partner Violence: Not on file  ? ? ?Family History  ?Problem Relation Age of Onset  ? CAD Mother   ? ? ?No past surgical history on file. ? ?ROS: ?Review of Systems ?Negative except as  stated above ? ?PHYSICAL EXAM: ?BP 121/79   Pulse 90   Ht $R'5\' 2"'bv$  (1.575 m)   Wt 144 lb 12.8 oz (65.7 kg)   LMP 12/17/2014   SpO2 98%   BMI 26.48 kg/m?   ?Physical Exam ? ?General appearance - alert, well appearing, and in no distress ?Skin - few dried spots on RT side of abdomen and one noted on upper chest above LT breast.  ? ? ? ?  Latest Ref Rng & Units 03/24/2021  ? 11:15 AM 11/18/2020  ? 11:15 AM 01/02/2020  ? 11:00 AM  ?CMP  ?Glucose 70 - 99 mg/dL 75   75   83    ?BUN 6 - 24 mg/dL $Remove'11   19   17    'qYtizuV$ ?Creatinine 0.57 - 1.00 mg/dL 0.92   0.98   1.17    ?Sodium 134 - 144 mmol/L 146   144   144    ?Potassium 3.5 - 5.2 mmol/L 4.7   4.1   4.8    ?Chloride 96 - 106 mmol/L 106   106   104    ?CO2 20 - 29 mmol/L $RemoveB'23   22   22    'wDppHfZa$ ?Calcium 8.7 - 10.2 mg/dL 9.3   9.2   9.6    ?Total Protein 6.0 - 8.5 g/dL  7.3   7.1    ?Total Bilirubin 0.0 - 1.2 mg/dL  0.3   0.4    ?Alkaline Phos 44 - 121 IU/L  147   126    ?AST 0 - 40 IU/L  16   14    ?ALT 0 - 32 IU/L  10   10    ? ?Lipid Panel  ?   ?Component Value Date/Time  ? CHOL 171 11/18/2020 1115  ? TRIG 241 (H) 11/18/2020 1115  ? HDL 78 11/18/2020 1115  ? CHOLHDL 2.2 11/18/2020 1115  ? CHOLHDL 2.4 10/26/2012 1052  ? VLDL 46 (H) 10/26/2012 1052  ? LDLCALC 56 11/18/2020 1115  ? LDLDIRECT 153 (H) 01/30/2008 2146  ? ? ?CBC ?   ?Component Value Date/Time  ? WBC 5.8 03/24/2021 1115  ? WBC 8.7 11/10/2017 1144  ? RBC 4.15 03/24/2021 1115  ? RBC 4.36 11/10/2017 1144  ? HGB 12.8 03/24/2021 1115  ? HCT 38.3 03/24/2021 1115  ? PLT 270 03/24/2021 1115  ? MCV 92 03/24/2021 1115  ? MCH 30.8 03/24/2021 1115  ? MCH 29.4 11/10/2017  1144  ? MCHC 33.4 03/24/2021 1115  ? MCHC 32.3 11/10/2017 1144  ? RDW 14.5 03/24/2021 1115  ? LYMPHSABS 2.4 03/24/2021 1115  ? MONOABS 0.5 11/10/2017 1144  ? EOSABS 0.1 03/24/2021 1115  ? BASOSABS 0.1 03/24/2021

## 2021-08-18 ENCOUNTER — Other Ambulatory Visit: Payer: Self-pay | Admitting: Family Medicine

## 2021-08-18 DIAGNOSIS — K219 Gastro-esophageal reflux disease without esophagitis: Secondary | ICD-10-CM

## 2021-08-18 DIAGNOSIS — N951 Menopausal and female climacteric states: Secondary | ICD-10-CM

## 2021-08-18 MED ORDER — CLONIDINE HCL 0.1 MG PO TABS: ORAL_TABLET | ORAL | 3 refills | Status: DC

## 2021-08-18 MED ORDER — PANTOPRAZOLE SODIUM 40 MG PO TBEC: 40.0000 mg | DELAYED_RELEASE_TABLET | Freq: Every day | ORAL | 3 refills | Status: DC

## 2021-08-18 NOTE — Telephone Encounter (Signed)
Medication Refill - Medication:   pantoprazole (PROTONIX) 40 MG tablet  cloNIDine (CATAPRES) 0.1 MG tablet   Has the patient contacted their pharmacy? No.  Preferred Pharmacy (with phone number or street name):  [id number] - [place], [place] - [address] AT [facility] [address] Phone:  [phone]  Fax:  [phone]     Has the patient been seen for an appointment in the last year OR does the pYes.    Patient requesting 90 day supply on both rx

## 2021-09-04 ENCOUNTER — Other Ambulatory Visit: Payer: Self-pay | Admitting: Family Medicine

## 2021-09-04 DIAGNOSIS — I10 Essential (primary) hypertension: Secondary | ICD-10-CM

## 2021-09-04 NOTE — Telephone Encounter (Signed)
Requested Prescriptions  Pending Prescriptions Disp Refills  . amLODipine (NORVASC) 10 MG tablet [Pharmacy Med Name: AMLODIPINE BESYLATE 10MG  TABLETS] 90 tablet 1    Sig: TAKE 1 TABLET(10 MG) BY MOUTH DAILY.     Cardiovascular: Calcium Channel Blockers 2 Passed - 09/04/2021  2:50 PM      Passed - Last BP in normal range    BP Readings from Last 1 Encounters:  07/03/21 121/79         Passed - Last Heart Rate in normal range    Pulse Readings from Last 1 Encounters:  07/03/21 90         Passed - Valid encounter within last 6 months    Recent Outpatient Visits          2 months ago Rash   [facility] 09/02/21 [place], [place]   3 months ago Tick bite of front wall of thorax, unspecified location, initial encounter   [facility] [place], [place], [place]   5 months ago Vasomotor symptoms due to menopause   [facility] [place], [place], [place]   9 months ago Controlled type 2 diabetes mellitus without complication, without long-term current use of insulin (HCC)   [facility] [place], [place]   1 year ago Gastroesophageal reflux disease without esophagitis   [facility] [place], [place], [place]

## 2021-09-22 ENCOUNTER — Ambulatory Visit: Payer: Self-pay

## 2021-09-22 NOTE — Telephone Encounter (Signed)
Chief Complaint: Back pain leg pain, and tick bites. Symptoms: Back pain for over 1 year, tick bites 1 month ago Frequency: over 1 year for back pain Pertinent Negatives: Patient denies neurological deficits Disposition: [] ED /[] Urgent Care (no appt availability in office) / [] Appointment(In office/virtual)/ []  [facility]/ [] Home Care/ [x] Refused Recommended Disposition /[]  Mobile Bus/ []  Follow-up with PCP Additional Notes: Pt called with complaint of tick bites and hip pain. Tick bites seem to have resolved, they were from over 1 month ago and ticks were not attached. We then addressed hip leg and back pain. Per pt this has been going on for over one year ever since she was "given a medication that paralyzed her."  Advised UC with Follow up in clinic. Pt does not feel that UC will do anything as she has done that before. First available for follow up was the end of September at 8:30 am. PT stated she can't get up that early. Went to next available a 10 am appt with Dr. .  PT stated she would take that appt until she realized that this appt was in nearly 2 months. Pt  ended phone call abruptly. Reason for Disposition  Unknown type of tick bite with no complications  [1] MODERATE back pain (e.g., interferes with normal activities) AND [2] present > 3 days  Answer Assessment - Initial Assessment Questions 1. ATTACHED:  "Is the tick still on the skin?"  (e.g., yes, no, unsure)     on 2. ONSET - TICK STILL ATTACHED:  "How long do you think the tick has been on your skin?" (e.g., hours, days, unsure)  Note:  Is there a recent activity (camping, hiking) where the caller may have been exposed?     no 3. ONSET - TICK NOT STILL ATTACHED: "If the tick has been removed, how long do you think the tick was attached before you removed it?" (e.g., 5 hours, 2 days). "When was this?"     A couple of hours. 4. LOCATION: "Where is the tick bite located?" (e.g., arm, leg)      breast 5. TYPE of TICK: "Is it a wood tick or a deer tick?" (e.g., deer tick, wood tick; unsure)     unsure 6. SIZE of TICK: "How big is the tick?" (e.g., size of poppy seed, apple seed, watermelon seed; unsure) Note: Deer ticks can be the size of a poppy seed (nymph) or an apple seed (adult).       Very small 7. ENGORGED: "Did the tick look flat or engorged (full, swollen)?" (e.g., flat, engorged; unsure)     no 8. OTHER SYMPTOMS: "Do you have any other symptoms?" (e.g., fever, rash, redness at bite area, red ring around bite)     no 9. PREGNANCY: "Is there any chance you are pregnant?" "When was your last menstrual period?"     no  Answer Assessment - Initial Assessment Questions 1. ONSET: "When did the pain begin?"      1 year or more 2. LOCATION: "Where does it hurt?" (upper, mid or lower back)     Rt hip, leg and back 3. SEVERITY: "How bad is the pain?"  (e.g., Scale 1-10; mild, moderate, or severe)   - MILD (1-3): Doesn't interfere with normal activities.    - MODERATE (4-7): Interferes with normal activities or awakens from sleep.    - SEVERE (8-10): Excruciating pain, unable to do any normal activities.      10/10 4. PATTERN: "Is  the pain constant?" (e.g., yes, no; constant, intermittent)      intermittent 5. RADIATION: "Does the pain shoot into your legs or somewhere else?"     Back lower back hip and leg. 6. CAUSE:  "What do you think is causing the back pain?"      Medication that paralyzed her for 4 days. 7. BACK OVERUSE:  "Any recent lifting of heavy objects, strenuous work or exercise?"     no 8. MEDICINES: "What have you taken so far for the pain?" (e.g., nothing, acetaminophen, NSAIDS)     Pain medicine 9. NEUROLOGIC SYMPTOMS: "Do you have any weakness, numbness, or problems with bowel/bladder control?"     Numbness in rt leg 10. OTHER SYMPTOMS: "Do you have any other symptoms?" (e.g., fever, abdomen pain, burning with urination, blood in urine)       no 11.  PREGNANCY: "Is there any chance you are pregnant?" "When was your last menstrual period?"       na  Protocols used: Tick Bite-A-AH, Back Pain-A-AH

## 2021-09-24 NOTE — Telephone Encounter (Signed)
Patient was called and given appointment with provider to discuss concerns.

## 2021-10-05 ENCOUNTER — Encounter: Payer: Self-pay | Admitting: Family Medicine

## 2021-10-05 ENCOUNTER — Ambulatory Visit: Payer: Self-pay | Attending: Family Medicine | Admitting: Family Medicine

## 2021-10-05 VITALS — BP 94/64 | HR 85 | Temp 98.7°F | Resp 15 | Ht 62.0 in | Wt 150.0 lb

## 2021-10-05 DIAGNOSIS — I10 Essential (primary) hypertension: Secondary | ICD-10-CM

## 2021-10-05 DIAGNOSIS — Z23 Encounter for immunization: Secondary | ICD-10-CM

## 2021-10-05 DIAGNOSIS — M25551 Pain in right hip: Secondary | ICD-10-CM

## 2021-10-05 DIAGNOSIS — K219 Gastro-esophageal reflux disease without esophagitis: Secondary | ICD-10-CM

## 2021-10-05 DIAGNOSIS — R569 Unspecified convulsions: Secondary | ICD-10-CM

## 2021-10-05 DIAGNOSIS — E119 Type 2 diabetes mellitus without complications: Secondary | ICD-10-CM

## 2021-10-05 DIAGNOSIS — N951 Menopausal and female climacteric states: Secondary | ICD-10-CM

## 2021-10-05 MED ORDER — ATORVASTATIN CALCIUM 20 MG PO TABS: 20.0000 mg | ORAL_TABLET | Freq: Every day | ORAL | 1 refills | Status: DC

## 2021-10-05 MED ORDER — DICLOFENAC SODIUM 75 MG PO TBEC: DELAYED_RELEASE_TABLET | ORAL | 1 refills | Status: DC

## 2021-10-05 MED ORDER — TIZANIDINE HCL 4 MG PO TABS: ORAL_TABLET | ORAL | 1 refills | Status: DC

## 2021-10-05 MED ORDER — CLONIDINE HCL 0.1 MG PO TABS: ORAL_TABLET | ORAL | 1 refills | Status: DC

## 2021-10-05 MED ORDER — VENLAFAXINE HCL ER 37.5 MG PO CP24: 37.5000 mg | ORAL_CAPSULE | Freq: Every day | ORAL | 1 refills | Status: DC

## 2021-10-05 MED ORDER — PANTOPRAZOLE SODIUM 40 MG PO TBEC: 40.0000 mg | DELAYED_RELEASE_TABLET | Freq: Every day | ORAL | 1 refills | Status: DC

## 2021-10-05 MED ORDER — AMLODIPINE BESYLATE 10 MG PO TABS: 10.0000 mg | ORAL_TABLET | Freq: Every day | ORAL | 1 refills | Status: DC

## 2021-10-05 NOTE — Patient Instructions (Signed)
Hip Pain The hip is the joint between the upper legs and the lower pelvis. The bones, cartilage, tendons, and muscles of your hip joint support your body and allow you to move around. Hip pain can range from a minor ache to severe pain in one or both of your hips. The pain may be felt on the inside of the hip joint near the groin, or on the outside near the buttocks and upper thigh. You may also have swelling or stiffness in your hip area. Follow these instructions at home: Managing pain, stiffness, and swelling     If directed, put ice on the painful area. To do this: Put ice in a plastic bag. Place a towel between your skin and the bag. Leave the ice on for 20 minutes, 2-3 times a day. If directed, apply heat to the affected area as often as told by your health care provider. Use the heat source that your health care provider recommends, such as a moist heat pack or a heating pad. Place a towel between your skin and the heat source. Leave the heat on for 20-30 minutes. Remove the heat if your skin turns bright red. This is especially important if you are unable to feel pain, heat, or cold. You may have a greater risk of getting burned. Activity Do exercises as told by your health care provider. Avoid activities that cause pain. General instructions  Take over-the-counter and prescription medicines only as told by your health care provider. Keep a journal of your symptoms. Write down: How often you have hip pain. The location of your pain. What the pain feels like. What makes the pain worse. Sleep with a pillow between your legs on your most comfortable side. Keep all follow-up visits as told by your health care provider. This is important. Contact a health care provider if: You cannot put weight on your leg. Your pain or swelling continues or gets worse after one week. It gets harder to walk. You have a fever. Get help right away if: You fall. You have a sudden increase in pain  and swelling in your hip. Your hip is red or swollen or very tender to touch. Summary Hip pain can range from a minor ache to severe pain in one or both of your hips. The pain may be felt on the inside of the hip joint near the groin, or on the outside near the buttocks and upper thigh. Avoid activities that cause pain. Write down how often you have hip pain, the location of the pain, what makes it worse, and what it feels like. This information is not intended to replace advice given to you by your health care provider. Make sure you discuss any questions you have with your health care provider. Document Revised: 06/26/2018 Document Reviewed: 06/26/2018 Elsevier Patient Education  2023 Elsevier Inc.  

## 2021-10-05 NOTE — Addendum Note (Signed)
Addended by: Ronette Deter on: 10/05/2021 10:42 AM   Modules accepted: Orders

## 2021-10-05 NOTE — Progress Notes (Signed)
Subjective:  Patient ID: Christina Byrd, female    DOB: 05/18/1965  Age: 56 y.o. MRN: [id number]  CC: Hip Pain (Started after taking unknown medication, Joint pain and tingling generalized, leg pain, hand pain//)   HPI Christina Byrd is a 56 y.o. year old female with a history of  type 2 diabetes mellitus (A1c 5.5), GERD, Seizures , Gout  Interval History: Last seen by neurology in 04/2021 for management of seizures and per notes she was advised to continue with Keppra 500 mg twice daily and follow-up in 1 year.  She has had no recent seizure episodes.  She Complains of pain in her right lower back around her brim of her pelvic bone radiating anteriorly to her anterior right hip joint and when she sits for a prolonged period of time she has to hold unto support when walking.  Symptoms improve as the day progresses. Both legs 'go to sleep' after prolonged sitting. Denies history of falls. She uses her diclofenac and tizanidine intermittently for pain.  Diabetes is stable on diet control and she is no longer on metformin.  Doing well on her antihypertensive.   Past Medical History:  Diagnosis Date   GASTROESOPHAGEAL REFLUX, NO ESOPHAGITIS 04/21/2006   Qualifier: Diagnosis of  By: Benna Dunks     GERD (gastroesophageal reflux disease) Dx 1995   HTN (hypertension)     History reviewed. No pertinent surgical history.  Family History  Problem Relation Age of Onset   CAD Mother     Social History   Socioeconomic History   Marital status: Single    Spouse name: Not on file   Number of children: Not on file   Years of education: Not on file   Highest education level: Not on file  Occupational History   Not on file  Tobacco Use   Smoking status: Former    Types: Cigarettes    Quit date: 06/25/2012    Years since quitting: 9.2   Smokeless tobacco: Never  Vaping Use   Vaping Use: Never used  Substance and Sexual Activity   Alcohol use: Yes    Alcohol/week: 3.0  standard drinks of alcohol    Types: 3 Standard drinks or equivalent per week    Comment: ADMITS TO DRINKING 3-4 BEERS/DAY   Drug use: No   Sexual activity: Not Currently  Other Topics Concern   Not on file  Social History Narrative   Pt lives in single story home with her partner   Has 1 son   5 grandchildren   10th grade education   Works at [employer]    Right handed   Social Determinants of Health   Financial Resource Strain: Not on file  Food Insecurity: Not on file  Transportation Needs: Not on file  Physical Activity: Not on file  Stress: Not on file  Social Connections: Not on file    Allergies  Allergen Reactions   Losartan Potassium     Rash   Levaquin [Levofloxacin In D5w] Rash   Ace Inhibitors Cough    REACTION: cough   Codeine Nausea Only   Cefepime Rash    09/04/12 pm Patient started to break out with small macules after IV Vanco infusion, then macules increased in size after starting cefepime infusion.   Vancomycin Rash    09/04/12 pm Patient started to break out with small macules after IV Vanco infusion, then macules increased in size after starting cefepime infusion.    Outpatient Medications Prior  to Visit  Medication Sig Dispense Refill   aspirin EC 81 MG tablet Take 1 tablet (81 mg total) by mouth daily.     Blood Glucose Monitoring Suppl (TRUE METRIX METER) DEVI 1 kit by Does not apply route 3 (three) times daily. CHECK BLOOD SUGAR UP TO 3 TIMES DAILY. E11.9 1 each 0   dexamethasone 0.5 MG/5ML elixir Take 5 mLs (0.5 mg total) by mouth 3 (three) times daily. Swish for 5 minutes and spit out 100 mL 0   fluticasone (FLONASE) 50 MCG/ACT nasal spray USE 2 SPRAYS IN EACH NOSTRIL DAILY 16 g 1   folic acid (FOLVITE) 1 MG tablet Take 1 tablet (1 mg total) by mouth daily. 30 tablet 11   glucose blood (TRUE METRIX BLOOD GLUCOSE TEST) test strip Use as instructed 100 each 12   levETIRAcetam (KEPPRA) 500 MG tablet Take 1 tablet (500 mg total) by mouth 2 (two)  times daily. 180 tablet 3   LORazepam (ATIVAN) 0.5 MG tablet take 1 tab by mouth once 30 minutes before MRI 1 tablet 0   magnesium oxide (MAG-OX) 400 (240 Mg) MG tablet TAKE 1 TABLET BY MOUTH EVERY DAY 90 tablet 0   predniSONE (DELTASONE) 20 MG tablet Take 1 tablet (20 mg total) by mouth daily with breakfast. 5 tablet 0   thiamine (VITAMIN B-1) 100 MG tablet Take 1 tablet (100 mg total) by mouth daily. 30 tablet 11   TRUEplus Lancets 28G MISC SMARTSIG:Topical 1 to 3 Times Daily 100 each 3   vitamin B-12 (CYANOCOBALAMIN) 1000 MCG tablet Take 1 tablet (1,000 mcg total) by mouth daily. 30 tablet 11   amLODipine (NORVASC) 10 MG tablet TAKE 1 TABLET(10 MG) BY MOUTH DAILY. 90 tablet 0   atorvastatin (LIPITOR) 20 MG tablet TAKE 1 TABLET(20 MG) BY MOUTH DAILY 90 tablet 1   cloNIDine (CATAPRES) 0.1 MG tablet TAKE 1 TABLET(0.1 MG) BY MOUTH AT BEDTIME AS NEEDED FOR HOT FLASHES 30 tablet 3   diclofenac (VOLTAREN) 75 MG EC tablet TAKE 1 TABLET(75 MG) BY MOUTH TWICE DAILY 180 tablet 1   pantoprazole (PROTONIX) 40 MG tablet Take 1 tablet (40 mg total) by mouth daily. 30 tablet 3   tiZANidine (ZANAFLEX) 4 MG tablet TAKE 1 TABLET BY MOUTH EVERY 8 HOURS AS NEEDED MUSCLE SPAMS 120 tablet 1   venlafaxine XR (EFFEXOR XR) 37.5 MG 24 hr capsule Take 1 capsule (37.5 mg total) by mouth daily with breakfast. For hot flashes 30 capsule 3   No facility-administered medications prior to visit.     ROS Review of Systems  Constitutional:  Negative for activity change and appetite change.  HENT:  Negative for sinus pressure and sore throat.   Respiratory:  Negative for chest tightness, shortness of breath and wheezing.   Cardiovascular:  Negative for chest pain and palpitations.  Gastrointestinal:  Negative for abdominal distention, abdominal pain and constipation.  Genitourinary: Negative.   Musculoskeletal:        See HPI  Psychiatric/Behavioral:  Negative for behavioral problems and dysphoric mood.     Objective:   BP 94/64 (BP Location: Right Arm, Patient Position: Sitting, Cuff Size: Normal)   Pulse 85   Temp 98.7 F (37.1 C)   Resp 15   Ht 5' 2" (1.575 m)   Wt 150 lb (68 kg)   LMP 08/23/2011   SpO2 97%   BMI 27.44 kg/m      10/05/2021    9:51 AM 07/03/2021    2:57 PM 05/18/2021  4:32 PM  BP/Weight  Systolic BP 94 539 767  Diastolic BP 64 79 70  Wt. (Lbs) 150 144.8 144.8  BMI 27.44 kg/m2 26.48 kg/m2 26.48 kg/m2      Physical Exam Constitutional:      Appearance: She is well-developed.  Cardiovascular:     Rate and Rhythm: Normal rate.     Heart sounds: Normal heart sounds. No murmur heard. Pulmonary:     Effort: Pulmonary effort is normal.     Breath sounds: Normal breath sounds. No wheezing or rales.  Chest:     Chest wall: No tenderness.  Abdominal:     General: Bowel sounds are normal. There is no distension.     Palpations: Abdomen is soft. There is no mass.     Tenderness: There is no abdominal tenderness.  Musculoskeletal:        General: Normal range of motion.     Right lower leg: No edema.     Left lower leg: No edema.     Comments: No tenderness on palpation of entire spine but tenderness elicited on palpation of anterior aspect of right hip joint and on range of motion of right hip  Neurological:     Mental Status: She is alert and oriented to person, place, and time.  Psychiatric:        Mood and Affect: Mood normal.        Latest Ref Rng & Units 03/24/2021   11:15 AM 11/18/2020   11:15 AM 01/02/2020   11:00 AM  CMP  Glucose 70 - 99 mg/dL 75  75  83   BUN 6 - 24 mg/dL _0 Creatinine 0.57 - 1.00 mg/dL 0.92  0.98  1.17   Sodium 134 - 144 mmol/L 146  144  144   Potassium 3.5 - 5.2 mmol/L 4.7  4.1  4.8   Chloride 96 - 106 mmol/L 106  106  104   CO2 20 - 29 mmol/L _1 Calcium 8.7 - 10.2 mg/dL 9.3  9.2  9.6   Total Protein 6.0 - 8.5 g/dL  7.3  7.1   Total Bilirubin 0.0 - 1.2 mg/dL  0.3  0.4   Alkaline Phos 44 - 121 IU/L  147  126    AST 0 - 40 IU/L  16  14   ALT 0 - 32 IU/L  10  10     Lipid Panel     Component Value Date/Time   CHOL 171 11/18/2020 1115   TRIG 241 (H) 11/18/2020 1115   HDL 78 11/18/2020 1115   CHOLHDL 2.2 11/18/2020 1115   CHOLHDL 2.4 10/26/2012 1052   VLDL 46 (H) 10/26/2012 1052   LDLCALC 56 11/18/2020 1115   LDLDIRECT 153 (H) 01/30/2008 2146    CBC    Component Value Date/Time   WBC 5.8 03/24/2021 1115   WBC 8.7 11/10/2017 1144   RBC 4.15 03/24/2021 1115   RBC 4.36 11/10/2017 1144   HGB 12.8 03/24/2021 1115   HCT 38.3 03/24/2021 1115   PLT 270 03/24/2021 1115   MCV 92 03/24/2021 1115   MCH 30.8 03/24/2021 1115   MCH 29.4 11/10/2017 1144   MCHC 33.4 03/24/2021 1115   MCHC 32.3 11/10/2017 1144   RDW 14.5 03/24/2021 1115   LYMPHSABS 2.4 03/24/2021 1115   MONOABS 0.5 11/10/2017 1144   EOSABS 0.1 03/24/2021 1115   BASOSABS 0.1 03/24/2021 1115    Lab Results  Component Value Date   HGBA1C 5.5 03/24/2021    Assessment & Plan:  1. Right hip pain Uncontrolled Possibly underlying osteoarthritis If conservative management is ineffective consider referral to orthopedic - Ambulatory referral to Physical Therapy - MS DIGITAL SCREENING TOMO BILATERAL; Future - XR HIPS BILAT W OR W/O PELVIS 2V - tiZANidine (ZANAFLEX) 4 MG tablet; TAKE 1 TABLET BY MOUTH EVERY 8 HOURS AS NEEDED MUSCLE SPAMS  Dispense: 120 tablet; Refill: 1 - diclofenac (VOLTAREN) 75 MG EC tablet; TAKE 1 TABLET(75 MG) BY MOUTH TWICE DAILY  Dispense: 180 tablet; Refill: 1  2. HYPERTENSION, BENIGN ESSENTIAL Soft blood pressure but previous blood pressures have been normal Advised that if symptomatic to let me know and I will decrease amlodipine dose Counseled on blood pressure goal of less than 130/80, low-sodium, DASH diet, medication compliance, 150 minutes of moderate intensity exercise per week. Discussed medication compliance, adverse effects. - amLODipine (NORVASC) 10 MG tablet; Take 1 tablet (10 mg total) by  mouth daily.  Dispense: 90 tablet; Refill: 1  3. Vasomotor symptoms due to menopause Stable - cloNIDine (CATAPRES) 0.1 MG tablet; TAKE 1 TABLET(0.1 MG) BY MOUTH AT BEDTIME AS NEEDED FOR HOT FLASHES  Dispense: 90 tablet; Refill: 1 - venlafaxine XR (EFFEXOR XR) 37.5 MG 24 hr capsule; Take 1 capsule (37.5 mg total) by mouth daily with breakfast. For hot flashes  Dispense: 90 capsule; Refill: 1  4. Controlled type 2 diabetes mellitus without complication, without long-term current use of insulin (HCC) Controlled with A1c of 5.5 Diet controlled Counseled on Diabetic diet, my plate method, 244 minutes of moderate intensity exercise/week Blood sugar logs with fasting goals of 80-120 mg/dl, random of less than 180 and in the event of sugars less than 60 mg/dl or greater than 400 mg/dl encouraged to notify the clinic. Advised on the need for annual eye exams, annual foot exams, Pneumonia vaccine. - LP+Non-HDL Cholesterol - atorvastatin (LIPITOR) 20 MG tablet; Take 1 tablet (20 mg total) by mouth daily.  Dispense: 90 tablet; Refill: 1 - Hemoglobin Q2M - Basic Metabolic Panel  5. Gastroesophageal reflux disease without esophagitis Controlled - pantoprazole (PROTONIX) 40 MG tablet; Take 1 tablet (40 mg total) by mouth daily.  Dispense: 90 tablet; Refill: 1  6. Seizure ([place]) Stable on Keppra    Meds ordered this encounter  Medications   amLODipine (NORVASC) 10 MG tablet    Sig: Take 1 tablet (10 mg total) by mouth daily.    Dispense:  90 tablet    Refill:  1    **Patient requests 90 days supply**   cloNIDine (CATAPRES) 0.1 MG tablet    Sig: TAKE 1 TABLET(0.1 MG) BY MOUTH AT BEDTIME AS NEEDED FOR HOT FLASHES    Dispense:  90 tablet    Refill:  1   atorvastatin (LIPITOR) 20 MG tablet    Sig: Take 1 tablet (20 mg total) by mouth daily.    Dispense:  90 tablet    Refill:  1   venlafaxine XR (EFFEXOR XR) 37.5 MG 24 hr capsule    Sig: Take 1 capsule (37.5 mg total) by mouth daily with  breakfast. For hot flashes    Dispense:  90 capsule    Refill:  1   tiZANidine (ZANAFLEX) 4 MG tablet    Sig: TAKE 1 TABLET BY MOUTH EVERY 8 HOURS AS NEEDED MUSCLE SPAMS    Dispense:  120 tablet    Refill:  1   pantoprazole (PROTONIX) 40 MG tablet    Sig:  Take 1 tablet (40 mg total) by mouth daily.    Dispense:  90 tablet    Refill:  1   diclofenac (VOLTAREN) 75 MG EC tablet    Sig: TAKE 1 TABLET(75 MG) BY MOUTH TWICE DAILY    Dispense:  180 tablet    Refill:  1    Follow-up: Return in about 6 months (around 04/07/2022) for Chronic medical conditions.       Charlott Rakes, MD, FAAFP. [facility] [place], [place]   10/05/2021, 10:21 AM

## 2021-10-06 ENCOUNTER — Telehealth: Payer: Self-pay | Admitting: Pharmacist

## 2021-10-06 ENCOUNTER — Other Ambulatory Visit: Payer: Self-pay

## 2021-10-06 ENCOUNTER — Other Ambulatory Visit: Payer: Self-pay | Admitting: Family Medicine

## 2021-10-06 LAB — BASIC METABOLIC PANEL
BUN/Creatinine Ratio: 11 (ref 9–23)
BUN: 12 mg/dL (ref 6–24)
CO2: 23 mmol/L (ref 20–29)
Calcium: 9 mg/dL (ref 8.7–10.2)
Chloride: 106 mmol/L (ref 96–106)
Creatinine, Ser: 1.08 mg/dL — ABNORMAL HIGH (ref 0.57–1.00)
Glucose: 85 mg/dL (ref 70–99)
Potassium: 4 mmol/L (ref 3.5–5.2)
Sodium: 144 mmol/L (ref 134–144)
eGFR: 60 mL/min/{1.73_m2} (ref >59)

## 2021-10-06 LAB — LP+NON-HDL CHOLESTEROL
Cholesterol, Total: 119 mg/dL (ref 100–199)
HDL: 64 mg/dL (ref >39)
LDL Chol Calc (NIH): 11 mg/dL (ref 0–99)
Total Non-HDL-Chol (LDL+VLDL): 55 mg/dL (ref 0–129)
Triglycerides: 323 mg/dL — ABNORMAL HIGH (ref 0–149)
VLDL Cholesterol Cal: 44 mg/dL — ABNORMAL HIGH (ref 5–40)

## 2021-10-06 LAB — HEMOGLOBIN A1C
Est. average glucose Bld gHb Est-mCnc: 108 mg/dL
Hgb A1c MFr Bld: 5.4 % (ref 4.8–5.6)

## 2021-10-06 MED ORDER — FENOFIBRATE 48 MG PO TABS: 48.0000 mg | ORAL_TABLET | Freq: Every day | ORAL | 6 refills | Status: DC

## 2021-10-06 MED ORDER — OMEGA-3-ACID ETHYL ESTERS 1 G PO CAPS
2.0000 g | ORAL_CAPSULE | Freq: Two times a day (BID) | ORAL | 6 refills | Status: DC
Start: 2021-10-06 — End: 2022-07-13
  Filled 2021-10-06: qty 60, 15d supply, fill #0

## 2021-10-06 NOTE — Addendum Note (Signed)
Addended by: Hoy Register on: 10/06/2021 04:45 PM   Modules accepted: Orders

## 2021-10-06 NOTE — Telephone Encounter (Signed)
I have sent Prescription for Fenofibrate to her Pharmacy.

## 2021-10-06 NOTE — Telephone Encounter (Signed)
Dr. Alvis Lemmings,   Contacted by patient's pharmacy this morning. She adamently refuses to take omega-3 capsules. She says she cannot take capsules. Unfortunately, these are only available in capsules. Are we able to use something for high triglycerides available in tablet form?

## 2021-10-07 ENCOUNTER — Other Ambulatory Visit: Payer: Self-pay

## 2021-10-12 ENCOUNTER — Telehealth: Payer: Self-pay | Admitting: Emergency Medicine

## 2021-10-12 NOTE — Telephone Encounter (Signed)
Copied from CRM [id number]. Topic: General - Other >> Oct 12, 2021  1:05 PM [place] B wrote: Reason for [id number] called in about discussion of fish oil pills she had with Dr Alvis Lemmings, she says she was told she can get it over the counter, but she needs to know what specific kind to get, also she is waiting for status of application for orange card. Please call back

## 2021-10-14 NOTE — Telephone Encounter (Signed)
Patient calling back again, waiting for call for name of fish oil capsules to pick up over the counter. She doesn't want the oil kind

## 2021-10-14 NOTE — Telephone Encounter (Signed)
I addressed this with her last week. She does not like to take capsules. Fish oil and omega-3's come in capsule form both rx and OTC. Therefore, Dr. Alvis Lemmings changed her to rx fenofibrate instead. Just wanted to give you a heads up.

## 2021-10-15 ENCOUNTER — Telehealth: Payer: Self-pay

## 2021-10-15 NOTE — Telephone Encounter (Signed)
Pt called and was upset about not receiving a call back about the fish oil pills and which ones she is suppose to get / she stated to tell Alycia not to worry about it and she may need to find another pcp/ please advise

## 2021-10-19 NOTE — Telephone Encounter (Signed)
Noted  

## 2021-10-20 NOTE — Telephone Encounter (Signed)
Error

## 2021-10-27 ENCOUNTER — Ambulatory Visit: Payer: No Typology Code available for payment source | Admitting: Nurse Practitioner

## 2021-10-27 ENCOUNTER — Telehealth: Payer: Self-pay | Admitting: Emergency Medicine

## 2021-10-27 NOTE — Telephone Encounter (Signed)
Patient presented to the office after she said she was instructed by the [facility] that she could still come to her appointment although her appointment was at 4:10 and patient showed up at 4:30. Patient became highly aggressive in her voice and began cursing at staff as staff was trying to explain her the situation and that she was at fault for being late to her appointment and that the provider she was seeing marked her as a NO SHOW. Patient began getting louder in front of patient in the lobby saying she was supposed to had been scheduled to see a female provider to discuss  her medications and when explained she was misinformed she became more upset saying she will not see her PCP because her PCP gave her medications that paralyzed her 4 days and that she also gave her blood sugar medication that she does not need and will not take, patient also states she should have sued provider for the wrong medication dispensed to her and that she is not going to take any of the new medications ordered for until she talks to provider that will help explain and answer her medication question. When tried get patient rescheduled to see Franky Macho she got upset at how far this next appointment was, patient stated "fuck this shit I am going to sue like I should have" and stormed out of the office. Attempted to help patient as best as we could but patient refused our assistance.

## 2021-10-29 ENCOUNTER — Encounter: Payer: Self-pay | Admitting: Family Medicine

## 2021-10-31 ENCOUNTER — Other Ambulatory Visit: Payer: Self-pay | Admitting: Family Medicine

## 2021-10-31 DIAGNOSIS — M25551 Pain in right hip: Secondary | ICD-10-CM

## 2021-10-31 DIAGNOSIS — N951 Menopausal and female climacteric states: Secondary | ICD-10-CM

## 2021-11-17 NOTE — Progress Notes (Unsigned)
Office Visit    Patient Name: Christina Byrd Date of Encounter: 11/17/2021  Primary Care Provider:  Hoy Register, MD Primary Cardiologist:  Charlton Haws, MD Primary Electrophysiologist: None  Chief Complaint    Christina Byrd is a 56 y.o. female with PMH of HFimEF, GERD, EtOH abuse, DM (diet controlled), HLD who presents today for overdue 1 year follow-up of CHF.  Past Medical History    Past Medical History:  Diagnosis Date   GASTROESOPHAGEAL REFLUX, NO ESOPHAGITIS 04/21/2006   Qualifier: Diagnosis of  By: Levada Schilling     GERD (gastroesophageal reflux disease) Dx 1995   HTN (hypertension)    No past surgical history on file.  Allergies  Allergies  Allergen Reactions   Losartan Potassium     Rash   Levaquin [Levofloxacin In D5w] Rash   Ace Inhibitors Cough    REACTION: cough   Codeine Nausea Only   Cefepime Rash    09/04/12 pm Patient started to break out with small macules after IV Vanco infusion, then macules increased in size after starting cefepime infusion.   Vancomycin Rash    09/04/12 pm Patient started to break out with small macules after IV Vanco infusion, then macules increased in size after starting cefepime infusion.    History of Present Illness    Christina Byrd  is a 56 year old female with the above mention past medical history who presents today for follow-up of CHF.  She was initially seen by Dr. Eden Emms in 2014 referral from community health due to hospitalization for dyspnea.  Patient denies history of CHF or history of CAD.  Patient had 2D echo completed and EF was 20-25% moderate MR.  She was given samples of Benicar with improvement to BNP from 2600-160.  Patient unable to tolerate losartan due to rash.  R/LHC was recommended but not completed due to lack of insurance.  He return to clinic in 2016 was started on carvedilol hydralazine stopped due to headaches.  2D echo was completed 04/2019 with recovered EF of 50-55%, no RWMA, grade 1  DD with normal valve function.  She was seen most recently by Tereso Newcomer, PA on 05/2020.  During visit blood pressures were stable at 120/76 and patient was continued on carvedilol was off hydralazine and Imdur due to headache.  She was tolerating amlodipine 10 mg without any issues.  Patient presented for follow-up visit.  Patient became extremely nauseated and vomited multiple times.  She was advised to reschedule visit at a later time due to current state of health.  Visit was placed as no charge due to above circumstances.  Home Medications    Current Outpatient Medications  Medication Sig Dispense Refill   amLODipine (NORVASC) 10 MG tablet Take 1 tablet (10 mg total) by mouth daily. 90 tablet 1   aspirin EC 81 MG tablet Take 1 tablet (81 mg total) by mouth daily.     atorvastatin (LIPITOR) 20 MG tablet Take 1 tablet (20 mg total) by mouth daily. 90 tablet 1   Blood Glucose Monitoring Suppl (TRUE METRIX METER) DEVI 1 kit by Does not apply route 3 (three) times daily. CHECK BLOOD SUGAR UP TO 3 TIMES DAILY. E11.9 1 each 0   cloNIDine (CATAPRES) 0.1 MG tablet TAKE 1 TABLET(0.1 MG) BY MOUTH AT BEDTIME AS NEEDED FOR HOT FLASHES 90 tablet 1   dexamethasone 0.5 MG/5ML elixir Take 5 mLs (0.5 mg total) by mouth 3 (three) times daily. Swish for 5 minutes  and spit out 100 mL 0   diclofenac (VOLTAREN) 75 MG EC tablet TAKE 1 TABLET(75 MG) BY MOUTH TWICE DAILY 180 tablet 1   fenofibrate (TRICOR) 48 MG tablet Take 1 tablet (48 mg total) by mouth daily. 30 tablet 6   fluticasone (FLONASE) 50 MCG/ACT nasal spray USE 2 SPRAYS IN EACH NOSTRIL DAILY 16 g 1   folic acid (FOLVITE) 1 MG tablet Take 1 tablet (1 mg total) by mouth daily. 30 tablet 11   glucose blood (TRUE METRIX BLOOD GLUCOSE TEST) test strip Use as instructed 100 each 12   levETIRAcetam (KEPPRA) 500 MG tablet Take 1 tablet (500 mg total) by mouth 2 (two) times daily. 180 tablet 3   LORazepam (ATIVAN) 0.5 MG tablet take 1 tab by mouth once 30  minutes before MRI 1 tablet 0   magnesium oxide (MAG-OX) 400 (240 Mg) MG tablet TAKE 1 TABLET BY MOUTH EVERY DAY 90 tablet 0   omega-3 acid ethyl esters (LOVAZA) 1 g capsule Take 2 capsules (2 g total) by mouth 2 (two) times daily. 60 capsule 6   pantoprazole (PROTONIX) 40 MG tablet Take 1 tablet (40 mg total) by mouth daily. 90 tablet 1   predniSONE (DELTASONE) 20 MG tablet Take 1 tablet (20 mg total) by mouth daily with breakfast. 5 tablet 0   thiamine (VITAMIN B-1) 100 MG tablet Take 1 tablet (100 mg total) by mouth daily. 30 tablet 11   tiZANidine (ZANAFLEX) 4 MG tablet TAKE 1 TABLET BY MOUTH EVERY 8 HOURS AS NEEDED FOR MUSCLE SPASMS 120 tablet 1   TRUEplus Lancets 28G MISC SMARTSIG:Topical 1 to 3 Times Daily 100 each 3   venlafaxine XR (EFFEXOR XR) 37.5 MG 24 hr capsule Take 1 capsule (37.5 mg total) by mouth daily with breakfast. For hot flashes 90 capsule 1   vitamin B-12 (CYANOCOBALAMIN) 1000 MCG tablet Take 1 tablet (1,000 mcg total) by mouth daily. 30 tablet 11   No current facility-administered medications for this visit.     Review of Systems  Please see the history of present illness.      All other systems reviewed and are otherwise negative except as noted above.  Physical Exam    Wt Readings from Last 3 Encounters:  10/05/21 150 lb (68 kg)  07/03/21 144 lb 12.8 oz (65.7 kg)  05/18/21 144 lb 12.8 oz (65.7 kg)   AL:PFXTK were no vitals filed for this visit.,There is no height or weight on file to calculate BMI.  Constitutional:      Appearance: Healthy appearance. Not in distress.  Neck:     Vascular: JVD normal.  Pulmonary:     Effort: Pulmonary effort is normal.     Breath sounds: No wheezing. No rales. Diminished in the bases Cardiovascular:     Normal rate. Regular rhythm. Normal S1. Normal S2.      Murmurs: There is no murmur.  Edema:    Peripheral edema absent.  Abdominal:     Palpations: Abdomen is soft non tender. There is no hepatomegaly.  Skin:     General: Skin is warm and dry.  Neurological:     General: No focal deficit present.     Mental Status: Alert and oriented to person, place and time.     Cranial Nerves: Cranial nerves are intact.  EKG/LABS/Other Studies Reviewed      Lab Results  Component Value Date   WBC 5.8 03/24/2021   HGB 12.8 03/24/2021   HCT 38.3 03/24/2021  MCV 92 03/24/2021   PLT 270 03/24/2021   Lab Results  Component Value Date   CREATININE 1.08 (H) 10/05/2021   BUN 12 10/05/2021   NA 144 10/05/2021   K 4.0 10/05/2021   CL 106 10/05/2021   CO2 23 10/05/2021   Lab Results  Component Value Date   ALT 10 11/18/2020   AST 16 11/18/2020   ALKPHOS 147 (H) 11/18/2020   BILITOT 0.3 11/18/2020   Lab Results  Component Value Date   CHOL 119 10/05/2021   HDL 64 10/05/2021   LDLCALC 11 10/05/2021   LDLDIRECT 153 (H) 01/30/2008   TRIG 323 (H) 10/05/2021   CHOLHDL 2.2 11/18/2020    Lab Results  Component Value Date   HGBA1C 5.4 10/05/2021    Assessment & Plan    No visit completed due to patient illness    Medication Adjustments/Labs and Tests Ordered: Current medicines are reviewed at length with the patient today.  Concerns regarding medicines are outlined above.   Signed, Mable Fill, Marissa Nestle, NP 11/17/2021, 8:30 AM [facility]  Note:  This document was prepared using Dragon voice recognition software and may include unintentional dictation errors.

## 2021-11-18 ENCOUNTER — Encounter: Payer: Self-pay | Admitting: Nurse Practitioner

## 2021-11-18 ENCOUNTER — Ambulatory Visit: Payer: No Typology Code available for payment source | Attending: Nurse Practitioner | Admitting: Nurse Practitioner

## 2021-11-18 VITALS — BP 110/60 | HR 102 | Ht 62.0 in | Wt 147.0 lb

## 2021-11-18 DIAGNOSIS — I5032 Chronic diastolic (congestive) heart failure: Secondary | ICD-10-CM

## 2021-11-18 NOTE — Patient Instructions (Signed)
Medication Instructions:   *If you need a refill on your cardiac medications before your next appointment, please call your pharmacy*   Lab Work:  If you have labs (blood work) drawn today and your tests are completely normal, you will receive your results only by: [place] (if you have MyChart) OR A paper copy in the mail If you have any lab test that is abnormal or we need to change your treatment, we will call you to review the results.   Testing/Procedures:    Follow-Up: At [facility], you and your health needs are our priority.  As part of our continuing mission to provide you with exceptional heart care, we have created designated Provider Care Teams.  These Care Teams include your primary Cardiologist (physician) and Advanced Practice Providers (APPs -  Physician Assistants and Nurse Practitioners) who all work together to provide you with the care you need, when you need it.  We recommend signing up for the patient portal called "MyChart".  Sign up information is provided on this After Visit Summary.  MyChart is used to connect with patients for Virtual Visits (Telemedicine).  Patients are able to view lab/test results, encounter notes, upcoming appointments, etc.  Non-urgent messages can be sent to your provider as well.   To learn more about what you can do with MyChart, go to [email or web address].    Your next appointment:   6 month(s)  The format for your next appointment:   In Person  Provider:   Ambrose Pancoast PA    Other Instructions   Important Information About Sugar

## 2021-11-27 ENCOUNTER — Ambulatory Visit (HOSPITAL_COMMUNITY)
Admission: EM | Admit: 2021-11-27 | Discharge: 2021-11-27 | Disposition: A | Discharge status: Home / Self Care | Payer: No Typology Code available for payment source | Attending: Physician Assistant | Admitting: Physician Assistant

## 2021-11-27 ENCOUNTER — Ambulatory Visit: Payer: Self-pay | Admitting: *Deleted

## 2021-11-27 ENCOUNTER — Encounter (HOSPITAL_COMMUNITY): Payer: Self-pay

## 2021-11-27 DIAGNOSIS — R432 Parageusia: Secondary | ICD-10-CM

## 2021-11-27 DIAGNOSIS — K0889 Other specified disorders of teeth and supporting structures: Secondary | ICD-10-CM

## 2021-11-27 MED ORDER — AMOXICILLIN 500 MG PO CAPS: 500.0000 mg | ORAL_CAPSULE | Freq: Three times a day (TID) | ORAL | 0 refills | Status: DC

## 2021-11-27 NOTE — Telephone Encounter (Signed)
No body wants to listen to me.  Why does my dr. Joycelyn Man adding medications and not telling me about it.   I think I'm having a reaction to a medicine.   She is talking loudly and upset that she is being given medications and being told nothing about them.   The fish oil pill is too big.   I can't swallow it.   "I don't want to see Dr. Margarita Rana anymore".   "I'll keep the appt in Feb but I want another dr".    I'm going to urgent care now.   "I don't know what is going on but I'm going to the urgent care now".   I let her know that was fine to go on to the urgent care.  She never did tell me what symptoms she was having and to what.   She just kept talking loudly and that no one tells her anything.  She wanted me to put another dr. Into the same slot as she has now with Dr. Margarita Rana in Feb. For 6 mo. F/u.   I let her know I could not do that.   I would need to cancel the appt. With Dr. Margarita Rana and schedule her with someone else.   She kept insisting I just keep the appt. But put another dr in instead of Dr. Margarita Rana.   When I said I could not do that she said,   "Just call me back when you can find another appt. For me that's not with Dr. Margarita Rana."    "I'm  going to the urgent care now".    Per agent she has mucus in her throat and feels like she is choking.  He let me know she was upset with Dr. Margarita Rana before transferring her to me.  I forwarded this information to [facility] for Dr. Charlott Rakes.      Reason for Disposition  [1] Caller has NON-URGENT medicine question about med that PCP prescribed AND [2] triager unable to answer question  Answer Assessment - Initial Assessment Questions 1. NAME of MEDICINE: "What medicine(s) are you calling about?"     See documentation note for details of this call. 2. QUESTION: "What is your question?" (e.g., double dose of medicine, side effect)      3. PRESCRIBER: "Who prescribed the medicine?" Reason: if prescribed by specialist, call  should be referred to that group.     Dr. Margarita Rana 4. SYMPTOMS: "Do you have any symptoms?" If Yes, ask: "What symptoms are you having?"  "How bad are the symptoms (e.g., mild, moderate, severe)     Per agent mucus in throat and feeling like she is choking. 5. PREGNANCY:  "Is there any chance that you are pregnant?" "When was your last menstrual period?"     Not asked  Protocols used: Medication Question Call-A-AH

## 2021-11-27 NOTE — ED Provider Notes (Signed)
[place]    CSN: [id number] Arrival date & time: 11/27/21  1535      History   Chief Complaint Chief Complaint  Patient presents with   Mouth Lesions    HPI Christina Byrd is a 56 y.o. female.   Patient here today for evaluation of dental pain and abnormal sense of taste that is been ongoing for several months.  She reports that she was seen at the dentist and requested to have all lower teeth removed however dentist recommended cleaning instead.  Patient reports that her dental pain has worsened and is diffuse.  She also notes that she has lost ability to taste correctly and most things taste like salt.  She briefly mentions some issues with swallowing and feeling as if there is something in her throat.  She has had this assessed in the past with barium swallow about 18 months ago that was negative for abnormality.  She has not had fever.  She does not report treatment for symptoms.  The history is provided by the patient.  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   Hypomagnesemia 03/14/2018   Type 2 diabetes mellitus ([place]) 09/13/2016   Depression 07/08/2016   Seizure ([place]) 07/08/2016   Cocaine use    Hot flash, menopausal 06/22/2016   Intertrigo 06/22/2016   Gout of left ankle 10/09/2015   Left hip pain 09/02/2015   EtOH dependence ([place]) 09/02/2015   Colonoscopy refused 09/02/2015   Hyperuricemia 05/27/2015   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 No pertinent surgical history.  OB History   No obstetric history on file.      Home Medications    Prior to Admission medications   Medication Sig Start Date End Date Taking? Authorizing Provider  amoxicillin (AMOXIL) 500 MG capsule Take 1 capsule (500 mg total) by mouth 3 (three) times daily. 11/27/21  Yes Francene Finders, PA-C  amLODipine (NORVASC) 10 MG tablet Take 1 tablet (10 mg total) by mouth daily. 10/05/21   Charlott Rakes, MD  aspirin EC 81 MG tablet Take 1 tablet (81 mg total) by mouth daily. 10/26/12   Rai, Vernelle Emerald, MD  atorvastatin (LIPITOR) 20 MG tablet Take 1 tablet (20 mg total) by mouth daily. 10/05/21   Charlott Rakes, MD  Blood Glucose Monitoring Suppl (TRUE METRIX METER) DEVI 1 kit by Does not apply route 3 (three) times daily. CHECK BLOOD SUGAR UP TO 3 TIMES DAILY. E11.9 02/27/19   Charlott Rakes, MD  cloNIDine (CATAPRES) 0.1 MG tablet TAKE 1 TABLET(0.1 MG) BY MOUTH AT BEDTIME AS NEEDED FOR HOT FLASHES Patient not taking: Reported on 11/18/2021 11/02/21   Charlott Rakes, MD  dexamethasone 0.5  MG/5ML elixir Take 5 mLs (0.5 mg total) by mouth 3 (three) times daily. Swish for 5 minutes and spit out 04/13/21   Charlott Rakes, MD  diclofenac (VOLTAREN) 75 MG EC tablet TAKE 1 TABLET(75 MG) BY MOUTH TWICE DAILY Patient not taking: Reported on 11/18/2021 10/05/21   Charlott Rakes, MD  fenofibrate (TRICOR) 48 MG tablet Take 1 tablet (48 mg total) by mouth daily. Patient not taking: Reported on 11/18/2021 10/06/21   Charlott Rakes, MD  fluticasone (FLONASE) 50 MCG/ACT nasal spray USE 2 SPRAYS IN EACH NOSTRIL DAILY 06/25/21   Charlott Rakes, MD  folic acid (FOLVITE) 1 MG tablet Take 1  tablet (1 mg total) by mouth daily. Patient not taking: Reported on 11/18/2021 07/28/20   Cameron Sprang, MD  glucose blood (TRUE METRIX BLOOD GLUCOSE TEST) test strip Use as instructed 03/06/20   Charlott Rakes, MD  ketoconazole (NIZORAL) 2 % cream Apply 1 Application topically daily.    [provider]  levETIRAcetam (KEPPRA) 500 MG tablet Take 1 tablet (500 mg total) by mouth 2 (two) times daily. 05/04/21   Cameron Sprang, MD  LORazepam (ATIVAN) 0.5 MG tablet take 1 tab by mouth once 30 minutes before MRI Patient not taking: Reported on 11/18/2021 05/06/21   Charlott Rakes, MD  magnesium oxide (MAG-OX) 400 (240 Mg) MG tablet TAKE 1 TABLET BY MOUTH EVERY DAY 06/24/21   Charlott Rakes, MD  omega-3 acid ethyl esters (LOVAZA) 1 g capsule Take 2 capsules (2 g total) by mouth 2 (two) times daily. Patient not taking: Reported on 11/18/2021 10/06/21   Charlott Rakes, MD  pantoprazole (PROTONIX) 40 MG tablet Take 1 tablet (40 mg total) by mouth daily. 10/05/21   Charlott Rakes, MD  predniSONE (DELTASONE) 20 MG tablet Take 1 tablet (20 mg total) by mouth daily with breakfast. Patient not taking: Reported on 11/18/2021 03/24/21   Charlott Rakes, MD  thiamine (VITAMIN B-1) 100 MG tablet Take 1 tablet (100 mg total) by mouth daily. 07/28/20   Cameron Sprang, MD  tiZANidine (ZANAFLEX) 4 MG tablet TAKE 1 TABLET BY MOUTH EVERY 8 HOURS AS NEEDED FOR MUSCLE SPASMS Patient not taking: Reported on 11/18/2021 11/02/21   Charlott Rakes, MD  TRUEplus Lancets 28G MISC SMARTSIG:Topical 1 to 3 Times Daily 03/06/20   Charlott Rakes, MD  venlafaxine XR (EFFEXOR XR) 37.5 MG 24 hr capsule Take 1 capsule (37.5 mg total) by mouth daily with breakfast. For hot flashes Patient not taking: Reported on 11/18/2021 10/05/21   Charlott Rakes, MD  vitamin B-12 (CYANOCOBALAMIN) 1000 MCG tablet Take 1 tablet (1,000 mcg total) by mouth daily. 07/28/20   Cameron Sprang, MD  metFORMIN (GLUCOPHAGE) 500 MG tablet Take 0.5 tablets (250 mg  total) by mouth daily with breakfast. 03/05/20 04/02/20  Argentina Donovan, PA-C    Family History Family History  Problem Relation Age of Onset   CAD Mother     Social History Social History   Tobacco Use   Smoking status: Former    Types: Cigarettes    Quit date: 06/25/2012    Years since quitting: 9.4   Smokeless tobacco: Never  Vaping Use   Vaping Use: Never used  Substance Use Topics   Alcohol use: Yes    Alcohol/week: 3.0 standard drinks of alcohol    Types: 3 Standard drinks or equivalent per week    Comment: ADMITS TO DRINKING 3-4 BEERS/DAY   Drug use: No     Allergies   Losartan potassium, Levaquin [levofloxacin  in d5w], Ace inhibitors, Codeine, Cefepime, and Vancomycin   Review of Systems Review of Systems  Constitutional:  Negative for chills and fever.  HENT:  Positive for dental problem and trouble swallowing. Negative for mouth sores.   Eyes:  Negative for discharge and redness.  Gastrointestinal:  Negative for nausea and vomiting.     Physical Exam Triage Vital Signs ED Triage Vitals [11/27/21 1638]  Enc Vitals Group     BP (!) 148/88     Pulse Rate 100     Resp 16     Temp 98.7 F (37.1 C)     Temp Source Oral     SpO2 100 %     Weight      Height      Head Circumference      Peak Flow      Pain Score      Pain Loc      Pain Edu?      Excl. in [place]?    No data found.  Updated Vital Signs BP (!) 148/88 (BP Location: Left Arm)   Pulse 100   Temp 98.7 F (37.1 C) (Oral)   Resp 16   LMP 08/23/2011   SpO2 100%      Physical Exam Vitals and nursing note reviewed.  Constitutional:      General: She is not in acute distress.    Appearance: She is not ill-appearing.     Comments: Patient is tearful on exam  HENT:     Head: Normocephalic and atraumatic.     Mouth/Throat:     Mouth: Mucous membranes are moist.     Pharynx: Oropharynx is clear.     Comments: Poor dentition throughout with multiple missing teeth- gingival swelling,  erythema throughout Eyes:     Conjunctiva/sclera: Conjunctivae normal.  Cardiovascular:     Rate and Rhythm: Normal rate.  Pulmonary:     Effort: Pulmonary effort is normal. No respiratory distress.  Neurological:     Mental Status: She is alert.  Psychiatric:        Mood and Affect: Mood normal.        Behavior: Behavior normal.        Thought Content: Thought content normal.      UC Treatments / Results  Labs (all labs ordered are listed, but only abnormal results are displayed) Labs Reviewed - No data to display  EKG   Radiology No results found.  Procedures Procedures (including critical care time)  Medications Ordered in UC Medications - No data to display  Initial Impression / Assessment and Plan / UC Course  I have reviewed the triage vital signs and the nursing notes.  Pertinent labs & imaging results that were available during my care of the patient were reviewed by me and considered in my medical decision making (see chart for details).    Will treat with antibiotic therapy to cover possible abscess. Recommended follow up with dentist as well as PCP as it is possible she may need referral to ENT as well. There is concern for worrisome causes of dysgeusia given known alcohol dependence, former smoker, etc. Encouraged sooner follow up in the ED if she has any worsening symptoms.   Final Clinical Impressions(s) / UC Diagnoses   Final diagnoses:  Pain, dental  Dysgeusia     Discharge Instructions       Please report to ED with any worsening symptoms, including trouble swallowing.   It is extremely important that you  follow up with your primary care provider and dentist. You may need referral to ENT.      ED Prescriptions     Medication Sig Dispense Auth. Provider   amoxicillin (AMOXIL) 500 MG capsule Take 1 capsule (500 mg total) by mouth 3 (three) times daily. 21 capsule Francene Finders, PA-C      PDMP not reviewed this encounter.   Francene Finders, PA-C 11/27/21 1753

## 2021-11-27 NOTE — ED Triage Notes (Signed)
Pt had dental procedure several months ago. Pt reports loss of taste.

## 2021-11-27 NOTE — Discharge Instructions (Signed)
  Please report to ED with any worsening symptoms, including trouble swallowing.   It is extremely important that you follow up with your primary care provider and dentist. You may need referral to ENT.

## 2021-11-30 NOTE — Telephone Encounter (Signed)
Can you please transfer her care to another clinician?  She does have a warning letter in her chart regarding her behavior at the clinic.  Please see telephone encounter from Belva Chimes on 10/27/2021.  Please cancel appointments she has with me.  Thank you

## 2021-12-01 ENCOUNTER — Ambulatory Visit: Payer: Self-pay

## 2021-12-01 ENCOUNTER — Emergency Department (HOSPITAL_COMMUNITY)
Admission: EM | Admit: 2021-12-01 | Discharge: 2021-12-01 | Disposition: A | Discharge status: Home / Self Care | Payer: No Typology Code available for payment source | Attending: Emergency Medicine | Admitting: Emergency Medicine

## 2021-12-01 ENCOUNTER — Encounter (HOSPITAL_COMMUNITY): Payer: Self-pay | Admitting: Emergency Medicine

## 2021-12-01 ENCOUNTER — Other Ambulatory Visit: Payer: Self-pay

## 2021-12-01 DIAGNOSIS — Z7982 Long term (current) use of aspirin: Secondary | ICD-10-CM | POA: Insufficient documentation

## 2021-12-01 DIAGNOSIS — K0889 Other specified disorders of teeth and supporting structures: Secondary | ICD-10-CM | POA: Insufficient documentation

## 2021-12-01 MED ORDER — BENZOCAINE 20 % MT GEL
1.0000 | Freq: Four times a day (QID) | OROMUCOSAL | 0 refills | Status: DC | PRN
  Filled 2021-12-01: qty 30, 8d supply, fill #0

## 2021-12-01 MED ORDER — NAPROXEN 500 MG PO TABS
500.0000 mg | ORAL_TABLET | Freq: Two times a day (BID) | ORAL | 0 refills | Status: DC
  Filled 2021-12-01: qty 30, 15d supply, fill #0

## 2021-12-01 NOTE — Discharge Instructions (Addendum)
It was a pleasure taking care of you today.  As discussed, there is no abscess that I am able to drain.  You could possibly have an infection in the gums.  Continue taking your antibiotic from urgent care.  I am sending you home with pain medication.  Take as needed.  Do not mix with other over-the-counter pain medications. I am also sending you home with numbing gel. I have included the list of dental resources in the community.  Please call tomorrow to schedule appointment for further evaluation.  Return to the ER for new or worsening symptoms.

## 2021-12-01 NOTE — ED Provider Notes (Signed)
[facility] DEPARTMENT Provider Note   CSN: [id number] Arrival date & time: 12/01/21  1433     History  Chief Complaint  Patient presents with   Dental Pain    Christina Byrd is a 56 y.o. female who presents to the ED due to dental pain.  Patient states she recently had her teeth cleaned and has had dental pain ever since.  Patient seen in urgent care on 10/6 and discharged with an antibiotic.  Patient has been taking antibiotic with no improvement in symptoms.  No facial edema, trismus, or changes to phonation.  Denies fever and chills.  History obtained from patient and past medical records. No interpreter used during encounter.       Home Medications Prior to Admission medications   Medication Sig Start Date End Date Taking? Authorizing Provider  benzocaine (HURRICAINE) 20 % GEL Use as directed 1 Application in the mouth or throat 4 (four) times daily as needed. 12/01/21  Yes ,  C, PA-C  naproxen (NAPROSYN) 500 MG tablet Take 1 tablet (500 mg total) by mouth 2 (two) times daily. 12/01/21  Yes ,  C, PA-C  amLODipine (NORVASC) 10 MG tablet Take 1 tablet (10 mg total) by mouth daily. 10/05/21   Charlott Rakes, MD  amoxicillin (AMOXIL) 500 MG capsule Take 1 capsule (500 mg total) by mouth 3 (three) times daily. 11/27/21   Francene Finders, PA-C  aspirin EC 81 MG tablet Take 1 tablet (81 mg total) by mouth daily. 10/26/12   Rai, Vernelle Emerald, MD  atorvastatin (LIPITOR) 20 MG tablet Take 1 tablet (20 mg total) by mouth daily. 10/05/21   Charlott Rakes, MD  Blood Glucose Monitoring Suppl (TRUE METRIX METER) DEVI 1 kit by Does not apply route 3 (three) times daily. CHECK BLOOD SUGAR UP TO 3 TIMES DAILY. E11.9 02/27/19   Charlott Rakes, MD  cloNIDine (CATAPRES) 0.1 MG tablet TAKE 1 TABLET(0.1 MG) BY MOUTH AT BEDTIME AS NEEDED FOR HOT FLASHES Patient not taking: Reported on 11/18/2021 11/02/21   Charlott Rakes, MD  dexamethasone 0.5  MG/5ML elixir Take 5 mLs (0.5 mg total) by mouth 3 (three) times daily. Swish for 5 minutes and spit out 04/13/21   Charlott Rakes, MD  diclofenac (VOLTAREN) 75 MG EC tablet TAKE 1 TABLET(75 MG) BY MOUTH TWICE DAILY Patient not taking: Reported on 11/18/2021 10/05/21   Charlott Rakes, MD  fenofibrate (TRICOR) 48 MG tablet Take 1 tablet (48 mg total) by mouth daily. Patient not taking: Reported on 11/18/2021 10/06/21   Charlott Rakes, MD  fluticasone (FLONASE) 50 MCG/ACT nasal spray USE 2 SPRAYS IN EACH NOSTRIL DAILY 06/25/21   Charlott Rakes, MD  folic acid (FOLVITE) 1 MG tablet Take 1 tablet (1 mg total) by mouth daily. Patient not taking: Reported on 11/18/2021 07/28/20   Cameron Sprang, MD  glucose blood (TRUE METRIX BLOOD GLUCOSE TEST) test strip Use as instructed 03/06/20   Charlott Rakes, MD  ketoconazole (NIZORAL) 2 % cream Apply 1 Application topically daily.    [provider]  levETIRAcetam (KEPPRA) 500 MG tablet Take 1 tablet (500 mg total) by mouth 2 (two) times daily. 05/04/21   Cameron Sprang, MD  LORazepam (ATIVAN) 0.5 MG tablet take 1 tab by mouth once 30 minutes before MRI Patient not taking: Reported on 11/18/2021 05/06/21   Charlott Rakes, MD  magnesium oxide (MAG-OX) 400 (240 Mg) MG tablet TAKE 1 TABLET BY MOUTH EVERY DAY 06/24/21   Charlott Rakes, MD  omega-3 acid ethyl esters (LOVAZA) 1 g capsule Take 2 capsules (2 g total) by mouth 2 (two) times daily. Patient not taking: Reported on 11/18/2021 10/06/21   Charlott Rakes, MD  pantoprazole (PROTONIX) 40 MG tablet Take 1 tablet (40 mg total) by mouth daily. 10/05/21   Charlott Rakes, MD  predniSONE (DELTASONE) 20 MG tablet Take 1 tablet (20 mg total) by mouth daily with breakfast. Patient not taking: Reported on 11/18/2021 03/24/21   Charlott Rakes, MD  thiamine (VITAMIN B-1) 100 MG tablet Take 1 tablet (100 mg total) by mouth daily. 07/28/20   Cameron Sprang, MD  tiZANidine (ZANAFLEX) 4 MG tablet TAKE 1 TABLET BY MOUTH EVERY 8  HOURS AS NEEDED FOR MUSCLE SPASMS Patient not taking: Reported on 11/18/2021 11/02/21   Charlott Rakes, MD  TRUEplus Lancets 28G MISC SMARTSIG:Topical 1 to 3 Times Daily 03/06/20   Charlott Rakes, MD  venlafaxine XR (EFFEXOR XR) 37.5 MG 24 hr capsule Take 1 capsule (37.5 mg total) by mouth daily with breakfast. For hot flashes Patient not taking: Reported on 11/18/2021 10/05/21   Charlott Rakes, MD  vitamin B-12 (CYANOCOBALAMIN) 1000 MCG tablet Take 1 tablet (1,000 mcg total) by mouth daily. 07/28/20   Cameron Sprang, MD  metFORMIN (GLUCOPHAGE) 500 MG tablet Take 0.5 tablets (250 mg total) by mouth daily with breakfast. 03/05/20 04/02/20  Argentina Donovan, PA-C      Allergies    Losartan potassium, Levaquin [levofloxacin in d5w], Ace inhibitors, Codeine, Cefepime, and Vancomycin    Review of Systems   Review of Systems  Constitutional:  Negative for chills and fever.  HENT:  Positive for dental problem. Negative for sore throat, trouble swallowing and voice change.   All other systems reviewed and are negative.   Physical Exam Updated Vital Signs BP 121/77 (BP Location: Right Arm)   Pulse (!) 107   Temp 98.6 F (37 C) (Oral)   Resp 16   LMP 08/23/2011   SpO2 99%  Physical Exam Vitals and nursing note reviewed.  Constitutional:      General: She is not in acute distress.    Appearance: She is not ill-appearing.  HENT:     Head: Normocephalic.     Mouth/Throat:     Comments: Poor dentition throughout with missing teeth.  No abscess.  Tongue in normal position without protrusion.  No trismus.  Normal phonation.  Patient tolerating oral secretions without difficulty. Eyes:     Pupils: Pupils are equal, round, and reactive to light.  Cardiovascular:     Rate and Rhythm: Normal rate and regular rhythm.     Pulses: Normal pulses.     Heart sounds: Normal heart sounds. No murmur heard.    No friction rub. No gallop.  Pulmonary:     Effort: Pulmonary effort is normal.     Breath  sounds: Normal breath sounds.  Abdominal:     General: Abdomen is flat. There is no distension.     Palpations: Abdomen is soft.     Tenderness: There is no abdominal tenderness. There is no guarding or rebound.  Musculoskeletal:        General: Normal range of motion.     Cervical back: Neck supple.  Skin:    General: Skin is warm and dry.  Neurological:     General: No focal deficit present.     Mental Status: She is alert.  Psychiatric:        Mood and Affect: Mood normal.  Behavior: Behavior normal.     ED Results / Procedures / Treatments   Labs (all labs ordered are listed, but only abnormal results are displayed) Labs Reviewed - No data to display  EKG None  Radiology No results found.  Procedures Procedures    Medications Ordered in ED Medications - No data to display  ED Course/ Medical Decision Making/ A&P                           Medical Decision Making Risk OTC drugs. Prescription drug management.  56 year old female presents to the ED due to dental pain.  Patient recently had teeth cleaned and has had dental pain ever since.  No fever or chills.  No facial edema.  Upon arrival, stable vitals.  Patient mildly tachycardic at 107 however, during my initial evaluation patient's heart rate in the 90s.  Patient in no acute distress.  Poor dentition throughout.  Mild gingival edema.  No facial edema.  No trismus.  No drainable abscess. Low suspicion for Ludwig's or deep space infection.  Advised patient to follow-up with dentist for further evaluation.  Continue taking antibiotics from urgent care.  Discharge patient with naproxen and numbing gel. Strict ED precautions discussed with patient. Patient states understanding and agrees to plan. Patient discharged home in no acute distress and stable vitals        Final Clinical Impression(s) / ED Diagnoses Final diagnoses:  Pain, dental    Rx / DC Orders ED Discharge Orders          Ordered     naproxen (NAPROSYN) 500 MG tablet  2 times daily        12/01/21 1603    benzocaine (HURRICAINE) 20 % GEL  4 times daily PRN        12/01/21 1603              Suzy Bouchard, PA-C 12/01/21 1611    Fredia Sorrow, MD 12/04/21 1919

## 2021-12-01 NOTE — Telephone Encounter (Signed)
Pt called back (see triage note)- pt given appt same day and same time but with Dr. Wynetta Emery. Attempted to call pt but unable to LM.

## 2021-12-01 NOTE — Telephone Encounter (Signed)
  Chief Complaint: severe mouth pain and gum swelling Symptoms: severe pain, unable to eat. Frequency: 1 month after clean (was not able to give exact date of onset Pertinent Negatives: Patient denies fever, facial swelling, tongue swelling Disposition: [x] ED /[] Urgent Care (no appt availability in office) / [] Appointment(In office/virtual)/ []  [facility]/ [] Home Care/ [x] Refused Recommended Disposition /[]  Mobile Bus/ []  Follow-up with PCP Additional Notes: pt refused U/C - advised pt to go to ED- pt stated she will go.  Reason for Disposition  [1] SEVERE mouth pain (e.g., excruciating) AND [2] not improved after 2 hours of pain medicine  Answer Assessment - Initial Assessment Questions 1. ONSET: "When did the mouth start hurting?" (e.g., hours or days ago)      Month later after cleaning pain began, woke up next day mouth was swollen 2. SEVERITY: "How bad is the pain?" (Scale 1-10; mild, moderate or severe)   - MILD (1-3):  doesn't interfere with eating or normal activities   - MODERATE (4-7): interferes with eating some solids and normal activities   - SEVERE (8-10):  excruciating pain, interferes with most normal activities   - SEVERE DYSPHAGIA: can't swallow liquids, drooling     severe 3. SORES: "Are there any sores or ulcers in the mouth?" If Yes, ask: "What part of the mouth are the sores in?"     no 4. FEVER: "Do you have a fever?" If Yes, ask: "What is your temperature, how was it measured, and when did it start?"     no 5. CAUSE: "What do you think is causing the mouth pain?"     cleaning 6. OTHER SYMPTOMS: "Do you have any other symptoms?" (e.g., difficulty breathing)     Gums swollen, food altered taste, bottom of mouth top on right is sore dental and wont because of dental  Protocols used: Mouth Pain-A-AH

## 2021-12-01 NOTE — Telephone Encounter (Signed)
Called patient to see which Clinician she would like to see besided Dr. Margarita Rana. Will remove appointment from   No answer, unable to leave a voicemail.

## 2021-12-01 NOTE — ED Triage Notes (Signed)
Patient complains of dental pain in anterior lower mouth and right upper part of mouth. Patient states she had her teeth cleaned approximately one month ago and the pain has gotten progressively worse since then. Patient is alert, oriented, afebrile ,and in no apparent distress at this time.

## 2021-12-03 ENCOUNTER — Ambulatory Visit: Payer: Self-pay

## 2021-12-03 ENCOUNTER — Telehealth: Payer: Self-pay | Admitting: *Deleted

## 2021-12-03 NOTE — Telephone Encounter (Signed)
Message from Luciana Axe sent at 12/03/2021  2:14 PM EDT  Summary: Dental pain   Pt is calling to report that she went to the ED while Dental pain was advised to follow up with PCP. No available appts. Please advise         office advised they did not have opening at this time- triage does not cross schedule at different offices and request for appointment has been forwarded for provider review Reason for Disposition . NON-URGENT call redirected to PCP's office because it is open  Protocols used: No Contact or Duplicate Contact Call-A-AH

## 2021-12-03 NOTE — Telephone Encounter (Signed)
Pt called multiple times today to offer an appt with Juluis Mire, NP at [facility] per Carilyn Goodpasture but pt can not be reached.

## 2021-12-04 NOTE — Telephone Encounter (Signed)
Duplicate message. 

## 2021-12-04 NOTE — Telephone Encounter (Signed)
FYI

## 2021-12-07 ENCOUNTER — Ambulatory Visit (INDEPENDENT_AMBULATORY_CARE_PROVIDER_SITE_OTHER): Payer: No Typology Code available for payment source | Admitting: Primary Care

## 2021-12-07 ENCOUNTER — Encounter (INDEPENDENT_AMBULATORY_CARE_PROVIDER_SITE_OTHER): Payer: Self-pay | Admitting: Primary Care

## 2021-12-07 VITALS — BP 125/80 | HR 83 | Temp 98.5°F | Ht 62.0 in | Wt 143.2 lb

## 2021-12-07 DIAGNOSIS — K0889 Other specified disorders of teeth and supporting structures: Secondary | ICD-10-CM

## 2021-12-07 DIAGNOSIS — Z7689 Persons encountering health services in other specified circumstances: Secondary | ICD-10-CM

## 2021-12-07 NOTE — Progress Notes (Unsigned)
[facility]   Subjective:   Mrs. Christina Byrd is a 56 y.o. female presents to the ED due to dental pain on 10/6 and discharged with an antibiotic Patient states she recently had her teeth cleaned and has had dental pain ever since.  Patient has No headache, No chest pain, No abdominal pain - No Nausea, No new weakness tingling or numbness, No Cough - shortness of breath  Past Medical History:  Diagnosis Date   GASTROESOPHAGEAL REFLUX, NO ESOPHAGITIS 04/21/2006   Qualifier: Diagnosis of  By: Benna Dunks     GERD (gastroesophageal reflux disease) Dx 1995   HTN (hypertension)      Allergies  Allergen Reactions   Losartan Potassium     Rash   Levaquin [Levofloxacin In D5w] Rash   Ace Inhibitors Cough    REACTION: cough   Codeine Nausea Only   Cefepime Rash    09/04/12 pm Patient started to break out with small macules after IV Vanco infusion, then macules increased in size after starting cefepime infusion.   Vancomycin Rash    09/04/12 pm Patient started to break out with small macules after IV Vanco infusion, then macules increased in size after starting cefepime infusion.      Current Outpatient Medications on File Prior to Visit  Medication Sig Dispense Refill   amLODipine (NORVASC) 10 MG tablet Take 1 tablet (10 mg total) by mouth daily. 90 tablet 1   amoxicillin (AMOXIL) 500 MG capsule Take 1 capsule (500 mg total) by mouth 3 (three) times daily. 21 capsule 0   aspirin EC 81 MG tablet Take 1 tablet (81 mg total) by mouth daily.     atorvastatin (LIPITOR) 20 MG tablet Take 1 tablet (20 mg total) by mouth daily. 90 tablet 1   benzocaine (HURRICAINE) 20 % GEL Use as directed 1 Application in the mouth or throat 4 (four) times daily as needed. 30 g 0   Blood Glucose Monitoring Suppl (TRUE METRIX METER) DEVI 1 kit by Does not apply route 3 (three) times daily. CHECK BLOOD SUGAR UP TO 3 TIMES DAILY. E11.9 1 each 0   dexamethasone 0.5 MG/5ML elixir Take 5 mLs (0.5  mg total) by mouth 3 (three) times daily. Swish for 5 minutes and spit out 100 mL 0   diclofenac (VOLTAREN) 75 MG EC tablet TAKE 1 TABLET(75 MG) BY MOUTH TWICE DAILY 180 tablet 1   fenofibrate (TRICOR) 48 MG tablet Take 1 tablet (48 mg total) by mouth daily. 30 tablet 6   fluticasone (FLONASE) 50 MCG/ACT nasal spray USE 2 SPRAYS IN EACH NOSTRIL DAILY 16 g 1   folic acid (FOLVITE) 1 MG tablet Take 1 tablet (1 mg total) by mouth daily. 30 tablet 11   glucose blood (TRUE METRIX BLOOD GLUCOSE TEST) test strip Use as instructed 100 each 12   ketoconazole (NIZORAL) 2 % cream Apply 1 Application topically daily.     levETIRAcetam (KEPPRA) 500 MG tablet Take 1 tablet (500 mg total) by mouth 2 (two) times daily. 180 tablet 3   LORazepam (ATIVAN) 0.5 MG tablet take 1 tab by mouth once 30 minutes before MRI 1 tablet 0   magnesium oxide (MAG-OX) 400 (240 Mg) MG tablet TAKE 1 TABLET BY MOUTH EVERY DAY 90 tablet 0   naproxen (NAPROSYN) 500 MG tablet Take 1 tablet (500 mg total) by mouth 2 (two) times daily. 30 tablet 0   omega-3 acid ethyl esters (LOVAZA) 1 g capsule Take 2 capsules (2 g  total) by mouth 2 (two) times daily. 60 capsule 6   pantoprazole (PROTONIX) 40 MG tablet Take 1 tablet (40 mg total) by mouth daily. 90 tablet 1   predniSONE (DELTASONE) 20 MG tablet Take 1 tablet (20 mg total) by mouth daily with breakfast. 5 tablet 0   thiamine (VITAMIN B-1) 100 MG tablet Take 1 tablet (100 mg total) by mouth daily. 30 tablet 11   tiZANidine (ZANAFLEX) 4 MG tablet TAKE 1 TABLET BY MOUTH EVERY 8 HOURS AS NEEDED FOR MUSCLE SPASMS 120 tablet 1   TRUEplus Lancets 28G MISC SMARTSIG:Topical 1 to 3 Times Daily 100 each 3   venlafaxine XR (EFFEXOR XR) 37.5 MG 24 hr capsule Take 1 capsule (37.5 mg total) by mouth daily with breakfast. For hot flashes 90 capsule 1   vitamin B-12 (CYANOCOBALAMIN) 1000 MCG tablet Take 1 tablet (1,000 mcg total) by mouth daily. 30 tablet 11   cloNIDine (CATAPRES) 0.1 MG tablet TAKE 1  TABLET(0.1 MG) BY MOUTH AT BEDTIME AS NEEDED FOR HOT FLASHES (Patient not taking: Reported on 11/18/2021) 90 tablet 1   [DISCONTINUED] metFORMIN (GLUCOPHAGE) 500 MG tablet Take 0.5 tablets (250 mg total) by mouth daily with breakfast. 45 tablet 6   No current facility-administered medications on file prior to visit.   Review of System: Comprehensive ROS Pertinent positive and negative noted in HPI   Objective:  BP 125/80   Pulse 83   Temp 98.5 F (36.9 C) (Oral)   Ht $R'5\' 2"'kB$  (1.575 m)   Wt 143 lb 3.2 oz (65 kg)   LMP 08/23/2011   SpO2 99%   BMI 26.19 kg/m   Filed Weights   12/07/21 1455  Weight: 143 lb 3.2 oz (65 kg)   Physical Exam General Appearance: Well nourished, in no apparent distress. Eyes: PERRLA, EOMs, conjunctiva no swelling or erythema Sinuses: No Frontal/maxillary tenderness ENT/Mouth: Ext aud canals clear, TMs without erythema, bulging. No erythema, swelling, or exudate on post pharynx.  Tonsils not swollen or erythematous. Hearing normal.  Neck: Supple, thyroid normal.  Respiratory: Respiratory effort normal, BS equal bilaterally without rales, rhonchi, wheezing or stridor.  Cardio: RRR with no MRGs. Brisk peripheral pulses without edema.  Abdomen: Soft, + BS.  Non tender, no guarding, rebound, hernias, masses. Lymphatics: Non tender without lymphadenopathy.  Musculoskeletal: Full ROM, 5/5 strength, normal gait.  Skin: Warm, dry without rashes, lesions, ecchymosis.  Neuro: Cranial nerves intact. Normal muscle tone, no cerebellar symptoms. Sensation intact.  Psych: Awake and oriented X 3, normal affect, Insight and Judgment appropriate.    Assessment:  Christina Byrd was seen today for hospitalization follow-up.  Diagnoses and all orders for this visit:  Encounter to establish care Establish care with PCP  Pain, dental -     Ambulatory referral to Dentistry  This note has been created with Dragon speech recognition software and Engineer, materials. Any  transcriptional errors are unintentional.   Kerin Perna, NP 12/07/2021, 3:28 PM

## 2021-12-07 NOTE — Progress Notes (Signed)
Had cleaning done and now she has had swelling, seen in ED given antibiotics

## 2021-12-11 ENCOUNTER — Ambulatory Visit: Payer: No Typology Code available for payment source | Admitting: Family

## 2021-12-17 ENCOUNTER — Ambulatory Visit: Payer: No Typology Code available for payment source | Admitting: Cardiovascular Disease

## 2021-12-28 ENCOUNTER — Telehealth: Payer: Self-pay | Admitting: Internal Medicine

## 2021-12-28 NOTE — Telephone Encounter (Signed)
Copied from [place] [id number]. Topic: General - Other >> Dec 28, 2021  4:15 PM Tiffany B wrote: Reason for CRM: Patient states her orange card is now active and would like PCP to refer her to a Dentist as soon as possible. Patient states PCP is aware of ongoing tooth pain. Patient states she does not want to be referred to the Dentist she was referred to in the past because she believed they messed up her teeth. Patient would like a follow up call once the referral has been placed.

## 2021-12-30 NOTE — Telephone Encounter (Signed)
Pt called back for follow up on request, wants to speak to someone today please advise   Best contact: [phone]

## 2021-12-30 NOTE — Telephone Encounter (Signed)
Call placed to pt and no VM is set up to leave a message.  Urgent dental referral was placed on 12/29/2021

## 2022-01-01 ENCOUNTER — Encounter: Payer: Self-pay | Admitting: Internal Medicine

## 2022-01-01 ENCOUNTER — Ambulatory Visit: Payer: Self-pay | Attending: Internal Medicine | Admitting: Internal Medicine

## 2022-01-01 VITALS — BP 108/71 | HR 81 | Temp 98.3°F | Ht 62.0 in | Wt 142.0 lb

## 2022-01-01 DIAGNOSIS — I10 Essential (primary) hypertension: Secondary | ICD-10-CM | POA: Insufficient documentation

## 2022-01-01 DIAGNOSIS — K0889 Other specified disorders of teeth and supporting structures: Secondary | ICD-10-CM | POA: Insufficient documentation

## 2022-01-01 DIAGNOSIS — E119 Type 2 diabetes mellitus without complications: Secondary | ICD-10-CM | POA: Insufficient documentation

## 2022-01-01 MED ORDER — AMOXICILLIN 500 MG PO CAPS: 500.0000 mg | ORAL_CAPSULE | Freq: Three times a day (TID) | ORAL | 0 refills | Status: DC

## 2022-01-01 NOTE — Patient Instructions (Addendum)
A referral has already been submitted to [facility] for you.  If you do not wish to go back to [facility], you can try to get in with a private dentist.  I recommend using Tylenol or Advil as needed for dental pain.

## 2022-01-01 NOTE — Telephone Encounter (Signed)
Patient Came in late for her 1:30pm appt she was told to go [facility] When patient arrive at the office she was told her appt was here [facility] with Dr. Laural Benes Patient arrive at 1:50pm Appt was seen after causing a fuss about been told to go to the wrong office Dr. Laural Benes Agreed to see the patient after the appt while the Patient was checking out inquired more antibiotics That orig RX was written By a EDP Patient was orig told By Provider She don't need to take antibiotics Patient then return to front desk asking if provider sent RX for antibiotics due to patient been pushy matter Dr. Laural Benes personally Step away from clinic to come up front and speak with patient to inform her to fill her RX has been sent to [facility] and that she also told patient that she is not to be sch back at this office

## 2022-01-01 NOTE — Progress Notes (Signed)
Patient ID: RABIAH GOESER, female    DOB: Jan 11, 1966  MRN: [id number]  CC: Follow-up (Dental issues.)   Subjective: Christina Byrd is a 56 y.o. female who presents for UC visit Her concerns today include:  Pt with hx of HTN, HL, vasomotor menopausal symptom, DM  Patient arrived 20 minutes late for her appointment.  She became upset with the front desk personnel when she was told that she was late.  She complains of dental issue.  Reports having had teeth cleaning done several months ago with [facility].  She feels that the dentist did not do a good job.  She reports increased sensitivity in the first and second molar in the lower jaw on the right side when she bites down on certain foods.  She feels the dentist should have extracted all of her teeth as she had requested.  Reports that she tried to call them back but by that time her orange card had expired.  It has now been reinstated. Seen in the emergency room for dental pain 12/01/2021.  Prescribed benzocaine topical and Naprosyn. Pt reports she was given oxycodone for pain and was told to f/u with her doctor if she needed more.  Patient Active Problem List   Diagnosis Date Noted   Pharyngoesophageal dysphagia 03/28/2020   Cervicalgia 03/28/2020   Hot flashes 01/30/2020   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   Hypomagnesemia 03/14/2018   Type 2 diabetes mellitus ([place]) 09/13/2016   Depression 07/08/2016   Seizure ([place]) 07/08/2016   Cocaine use    Hot flash, menopausal 06/22/2016   Intertrigo 06/22/2016   Gout of left ankle 10/09/2015   Left hip pain 09/02/2015   EtOH dependence ([place]) 09/02/2015   Colonoscopy refused 09/02/2015   Hyperuricemia 05/27/2015   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   Pain and swelling of left ankle 12/02/2014   Tendinitis of right hip flexor 07/23/2014   Allergic rhinitis 05/13/2014   GERD (gastroesophageal reflux disease) 04/01/2014    Tendonitis, Achilles, right 04/01/2014   Osteoarthritis of left knee 12/11/2013   Ex-smoker 11/09/2008   OBESITY 08/12/2006   HYPERTENSION, BENIGN ESSENTIAL 08/12/2006     Current Outpatient Medications on File Prior to Visit  Medication Sig Dispense Refill   amLODipine (NORVASC) 10 MG tablet Take 1 tablet (10 mg total) by mouth daily. 90 tablet 1   aspirin EC 81 MG tablet Take 1 tablet (81 mg total) by mouth daily.     atorvastatin (LIPITOR) 20 MG tablet Take 1 tablet (20 mg total) by mouth daily. 90 tablet 1   benzocaine (HURRICAINE) 20 % GEL Use as directed 1 Application in the mouth or throat 4 (four) times daily as needed. 30 g 0   Blood Glucose Monitoring Suppl (TRUE METRIX METER) DEVI 1 kit by Does not apply route 3 (three) times daily. CHECK BLOOD SUGAR UP TO 3 TIMES DAILY. E11.9 1 each 0   cloNIDine (CATAPRES) 0.1 MG tablet TAKE 1 TABLET(0.1 MG) BY MOUTH AT BEDTIME AS NEEDED FOR HOT FLASHES (Patient not taking: Reported on 11/18/2021) 90 tablet 1   dexamethasone 0.5 MG/5ML elixir Take 5 mLs (0.5 mg total) by mouth 3 (three) times daily. Swish for 5 minutes and spit out 100 mL 0   diclofenac (VOLTAREN) 75 MG EC tablet TAKE 1 TABLET(75 MG) BY MOUTH TWICE DAILY 180 tablet 1   fenofibrate (TRICOR) 48 MG tablet Take 1 tablet (48 mg total) by mouth daily. [place]  tablet 6   fluticasone (FLONASE) 50 MCG/ACT nasal spray USE 2 SPRAYS IN EACH NOSTRIL DAILY 16 g 1   folic acid (FOLVITE) 1 MG tablet Take 1 tablet (1 mg total) by mouth daily. 30 tablet 11   glucose blood (TRUE METRIX BLOOD GLUCOSE TEST) test strip Use as instructed 100 each 12   ketoconazole (NIZORAL) 2 % cream Apply 1 Application topically daily.     levETIRAcetam (KEPPRA) 500 MG tablet Take 1 tablet (500 mg total) by mouth 2 (two) times daily. 180 tablet 3   LORazepam (ATIVAN) 0.5 MG tablet take 1 tab by mouth once 30 minutes before MRI 1 tablet 0   magnesium oxide (MAG-OX) 400 (240 Mg) MG tablet TAKE 1 TABLET BY MOUTH EVERY DAY 90  tablet 0   naproxen (NAPROSYN) 500 MG tablet Take 1 tablet (500 mg total) by mouth 2 (two) times daily. 30 tablet 0   omega-3 acid ethyl esters (LOVAZA) 1 g capsule Take 2 capsules (2 g total) by mouth 2 (two) times daily. 60 capsule 6   pantoprazole (PROTONIX) 40 MG tablet Take 1 tablet (40 mg total) by mouth daily. 90 tablet 1   predniSONE (DELTASONE) 20 MG tablet Take 1 tablet (20 mg total) by mouth daily with breakfast. 5 tablet 0   thiamine (VITAMIN B-1) 100 MG tablet Take 1 tablet (100 mg total) by mouth daily. 30 tablet 11   tiZANidine (ZANAFLEX) 4 MG tablet TAKE 1 TABLET BY MOUTH EVERY 8 HOURS AS NEEDED FOR MUSCLE SPASMS 120 tablet 1   TRUEplus Lancets 28G MISC SMARTSIG:Topical 1 to 3 Times Daily 100 each 3   venlafaxine XR (EFFEXOR XR) 37.5 MG 24 hr capsule Take 1 capsule (37.5 mg total) by mouth daily with breakfast. For hot flashes 90 capsule 1   vitamin B-12 (CYANOCOBALAMIN) 1000 MCG tablet Take 1 tablet (1,000 mcg total) by mouth daily. 30 tablet 11   [DISCONTINUED] metFORMIN (GLUCOPHAGE) 500 MG tablet Take 0.5 tablets (250 mg total) by mouth daily with breakfast. 45 tablet 6   No current facility-administered medications on file prior to visit.    Allergies  Allergen Reactions   Losartan Potassium     Rash   Levaquin [Levofloxacin In D5w] Rash   Ace Inhibitors Cough    REACTION: cough   Codeine Nausea Only   Cefepime Rash    09/04/12 pm Patient started to break out with small macules after IV Vanco infusion, then macules increased in size after starting cefepime infusion.   Vancomycin Rash    09/04/12 pm Patient started to break out with small macules after IV Vanco infusion, then macules increased in size after starting cefepime infusion.    Social History   Socioeconomic History   Marital status: Single    Spouse name: Not on file   Number of children: Not on file   Years of education: Not on file   Highest education level: Not on file  Occupational History   Not  on file  Tobacco Use   Smoking status: Former    Types: Cigarettes    Quit date: 06/25/2012    Years since quitting: 9.5   Smokeless tobacco: Never  Vaping Use   Vaping Use: Never used  Substance and Sexual Activity   Alcohol use: Yes    Alcohol/week: 3.0 standard drinks of alcohol    Types: 3 Standard drinks or equivalent per week    Comment: ADMITS TO DRINKING 3-4 BEERS/DAY   Drug use: No   Sexual  activity: Not Currently  Other Topics Concern   Not on file  Social History Narrative   Pt lives in single story home with her partner   Has 1 son   5 grandchildren   10th grade education   Works at [employer]    Right handed   Social Determinants of Health   Financial Resource Strain: Not on file  Food Insecurity: Not on file  Transportation Needs: Not on file  Physical Activity: Not on file  Stress: Not on file  Social Connections: Not on file  Intimate Partner Violence: Not on file    Family History  Problem Relation Age of Onset   CAD Mother     No past surgical history on file.  ROS: Review of Systems Negative except as stated above  PHYSICAL EXAM: BP 108/71 (BP Location: Left Arm, Patient Position: Sitting, Cuff Size: Normal)   Pulse 81   Temp 98.3 F (36.8 C) (Oral)   Ht _0  (1.575 m)   Wt 142 lb (64.4 kg)   LMP 08/23/2011   SpO2 99%   BMI 25.97 kg/m   Physical Exam  General appearance - alert, well appearing, and in no distress Mental status -patient very talkative Mouth -patient with significant plaque buildup on remaining teeth in the lower jaw or.  No signs of dental abscess noted at this time.  First molar sensitive to touch.  No swelling of the jaw or lips noted.      Latest Ref Rng & Units 10/05/2021   10:28 AM 03/24/2021   11:15 AM 11/18/2020   11:15 AM  CMP  Glucose 70 - 99 mg/dL 85  75  75   BUN 6 - 24 mg/dL _1 Creatinine 0.57 - 1.00 mg/dL 1.08  0.92  0.98   Sodium 134 - 144 mmol/L 144  146  144   Potassium 3.5 - 5.2  mmol/L 4.0  4.7  4.1   Chloride 96 - 106 mmol/L 106  106  106   CO2 20 - 29 mmol/L _2 Calcium 8.7 - 10.2 mg/dL 9.0  9.3  9.2   Total Protein 6.0 - 8.5 g/dL   7.3   Total Bilirubin 0.0 - 1.2 mg/dL   0.3   Alkaline Phos 44 - 121 IU/L   147   AST 0 - 40 IU/L   16   ALT 0 - 32 IU/L   10    Lipid Panel     Component Value Date/Time   CHOL 119 10/05/2021 1028   TRIG 323 (H) 10/05/2021 1028   HDL 64 10/05/2021 1028   CHOLHDL 2.2 11/18/2020 1115   CHOLHDL 2.4 10/26/2012 1052   VLDL 46 (H) 10/26/2012 1052   LDLCALC 11 10/05/2021 1028   LDLDIRECT 153 (H) 01/30/2008 2146    CBC    Component Value Date/Time   WBC 5.8 03/24/2021 1115   WBC 8.7 11/10/2017 1144   RBC 4.15 03/24/2021 1115   RBC 4.36 11/10/2017 1144   HGB 12.8 03/24/2021 1115   HCT 38.3 03/24/2021 1115   PLT 270 03/24/2021 1115   MCV 92 03/24/2021 1115   MCH 30.8 03/24/2021 1115   MCH 29.4 11/10/2017 1144   MCHC 33.4 03/24/2021 1115   MCHC 32.3 11/10/2017 1144   RDW 14.5 03/24/2021 1115   LYMPHSABS 2.4 03/24/2021 1115   MONOABS 0.5 11/10/2017 1144   EOSABS 0.1 03/24/2021 1115   BASOSABS 0.1 03/24/2021  1115    ASSESSMENT AND PLAN:  1. Pain, dental Advised patient that she does not appear to have infection at this time.  I was told by the lead CMA that referral back to [facility] adult dental was already re-opened.  Patient not wanting to go back to see them.  Informed her that in that case she may have to save funds so that she can call and get in with private dentist of her choice. Advise use of Extra Strength Tylenol PRN.  Advised that she does not need additional antibiotics. Patient left and then sat out in the lobby and inform the front desk that she was not leaving until she was given more of the same antibiotics that she received several weeks ago.  I sent a prescription for amoxicillin.         Patient was given the opportunity to ask questions.  Patient verbalized understanding of the plan and  was able to repeat key elements of the plan.   This documentation was completed using Radio producer.  Any transcriptional errors are unintentional.  No orders of the defined types were placed in this encounter.    Requested Prescriptions   Signed Prescriptions Disp Refills   amoxicillin (AMOXIL) 500 MG capsule 21 capsule 0    Sig: Take 1 capsule (500 mg total) by mouth 3 (three) times daily.    No follow-ups on file.  Karle Plumber, MD, FACP

## 2022-01-13 ENCOUNTER — Encounter (INDEPENDENT_AMBULATORY_CARE_PROVIDER_SITE_OTHER): Payer: Self-pay | Admitting: Primary Care

## 2022-01-13 ENCOUNTER — Ambulatory Visit (INDEPENDENT_AMBULATORY_CARE_PROVIDER_SITE_OTHER): Payer: Self-pay | Admitting: Primary Care

## 2022-01-13 VITALS — BP 98/67 | HR 94 | Resp 16 | Wt 140.8 lb

## 2022-01-13 DIAGNOSIS — M1A0610 Idiopathic chronic gout, right knee, without tophus (tophi): Secondary | ICD-10-CM

## 2022-01-13 DIAGNOSIS — E119 Type 2 diabetes mellitus without complications: Secondary | ICD-10-CM

## 2022-01-13 DIAGNOSIS — M1712 Unilateral primary osteoarthritis, left knee: Secondary | ICD-10-CM

## 2022-01-13 DIAGNOSIS — M25552 Pain in left hip: Secondary | ICD-10-CM

## 2022-01-13 DIAGNOSIS — I10 Essential (primary) hypertension: Secondary | ICD-10-CM

## 2022-01-13 DIAGNOSIS — H9202 Otalgia, left ear: Secondary | ICD-10-CM

## 2022-01-13 MED ORDER — ALLOPURINOL 100 MG PO TABS: 100.0000 mg | ORAL_TABLET | Freq: Every day | ORAL | 6 refills | Status: DC

## 2022-01-13 NOTE — Patient Instructions (Signed)
Sweet oil ear pain irritation

## 2022-01-13 NOTE — Progress Notes (Addendum)
[place], is a 56 y.o. female  XIP:[id number]  [id number]  DOB - [birth year]  Chief Complaint  Patient presents with   Knee Pain   Hip Pain       Subjective:   Christina Byrd is a 56 y.o. female here today for a acute visit.  She voices concerns about hip pain and knee pain.  Right knee pain she rates her pain 9 out of 10 states she takes prescription medication Diclofenac 75 mg twice daily. Her left hip pain she rates her pain is about 5-6/10.  The pain increases when the weather changes and when it is raining.  Aggravating factors for knee and hip is walking long distances.  Patient has No headache, No chest pain, No abdominal pain - No Nausea, No new weakness tingling or numbness, No Cough - shortness of breath  No problems updated.  Allergies  Allergen Reactions   Losartan Potassium     Rash   Levaquin [Levofloxacin In D5w] Rash   Ace Inhibitors Cough    REACTION: cough   Codeine Nausea Only   Cefepime Rash    09/04/12 pm Patient started to break out with small macules after IV Vanco infusion, then macules increased in size after starting cefepime infusion.   Vancomycin Rash    09/04/12 pm Patient started to break out with small macules after IV Vanco infusion, then macules increased in size after starting cefepime infusion.    Past Medical History:  Diagnosis Date   GASTROESOPHAGEAL REFLUX, NO ESOPHAGITIS 04/21/2006   Qualifier: Diagnosis of  By: Benna Dunks     GERD (gastroesophageal reflux disease) Dx 1995   HTN (hypertension)     Current Outpatient Medications on File Prior to Visit  Medication Sig Dispense Refill   amLODipine (NORVASC) 10 MG tablet Take 1 tablet (10 mg total) by mouth daily. 90 tablet 1   amoxicillin (AMOXIL) 500 MG capsule Take 1 capsule (500 mg total) by mouth 3 (three) times daily. (Patient not taking: Reported on 01/13/2022) 21 capsule 0   aspirin EC 81 MG tablet Take 1 tablet (81 mg total) by  mouth daily.     atorvastatin (LIPITOR) 20 MG tablet Take 1 tablet (20 mg total) by mouth daily. 90 tablet 1   benzocaine (HURRICAINE) 20 % GEL Use as directed 1 Application in the mouth or throat 4 (four) times daily as needed. 30 g 0   Blood Glucose Monitoring Suppl (TRUE METRIX METER) DEVI 1 kit by Does not apply route 3 (three) times daily. CHECK BLOOD SUGAR UP TO 3 TIMES DAILY. E11.9 1 each 0   cloNIDine (CATAPRES) 0.1 MG tablet TAKE 1 TABLET(0.1 MG) BY MOUTH AT BEDTIME AS NEEDED FOR HOT FLASHES (Patient not taking: Reported on 11/18/2021) 90 tablet 1   dexamethasone 0.5 MG/5ML elixir Take 5 mLs (0.5 mg total) by mouth 3 (three) times daily. Swish for 5 minutes and spit out 100 mL 0   diclofenac (VOLTAREN) 75 MG EC tablet TAKE 1 TABLET(75 MG) BY MOUTH TWICE DAILY 180 tablet 1   fenofibrate (TRICOR) 48 MG tablet Take 1 tablet (48 mg total) by mouth daily. 30 tablet 6   fluticasone (FLONASE) 50 MCG/ACT nasal spray USE 2 SPRAYS IN EACH NOSTRIL DAILY 16 g 1   folic acid (FOLVITE) 1 MG tablet Take 1 tablet (1 mg total) by mouth daily. 30 tablet 11   glucose blood (TRUE METRIX BLOOD GLUCOSE TEST) test strip Use as instructed 100 each 12  ketoconazole (NIZORAL) 2 % cream Apply 1 Application topically daily.     levETIRAcetam (KEPPRA) 500 MG tablet Take 1 tablet (500 mg total) by mouth 2 (two) times daily. 180 tablet 3   LORazepam (ATIVAN) 0.5 MG tablet take 1 tab by mouth once 30 minutes before MRI 1 tablet 0   magnesium oxide (MAG-OX) 400 (240 Mg) MG tablet TAKE 1 TABLET BY MOUTH EVERY DAY 90 tablet 0   naproxen (NAPROSYN) 500 MG tablet Take 1 tablet (500 mg total) by mouth 2 (two) times daily. 30 tablet 0   omega-3 acid ethyl esters (LOVAZA) 1 g capsule Take 2 capsules (2 g total) by mouth 2 (two) times daily. 60 capsule 6   pantoprazole (PROTONIX) 40 MG tablet Take 1 tablet (40 mg total) by mouth daily. 90 tablet 1   predniSONE (DELTASONE) 20 MG tablet Take 1 tablet (20 mg total) by mouth daily  with breakfast. (Patient not taking: Reported on 01/13/2022) 5 tablet 0   thiamine (VITAMIN B-1) 100 MG tablet Take 1 tablet (100 mg total) by mouth daily. 30 tablet 11   tiZANidine (ZANAFLEX) 4 MG tablet TAKE 1 TABLET BY MOUTH EVERY 8 HOURS AS NEEDED FOR MUSCLE SPASMS 120 tablet 1   TRUEplus Lancets 28G MISC SMARTSIG:Topical 1 to 3 Times Daily 100 each 3   venlafaxine XR (EFFEXOR XR) 37.5 MG 24 hr capsule Take 1 capsule (37.5 mg total) by mouth daily with breakfast. For hot flashes 90 capsule 1   vitamin B-12 (CYANOCOBALAMIN) 1000 MCG tablet Take 1 tablet (1,000 mcg total) by mouth daily. 30 tablet 11   [DISCONTINUED] metFORMIN (GLUCOPHAGE) 500 MG tablet Take 0.5 tablets (250 mg total) by mouth daily with breakfast. 45 tablet 6   No current facility-administered medications on file prior to visit.   Comprehensive ROS Pertinent positive and negative noted in HPI   Objective:   Vitals:   01/13/22 1533  BP: 98/67  Pulse: 94  Resp: 16  SpO2: 100%  Weight: 140 lb 12.8 oz (63.9 kg)    Exam General appearance : Awake, alert, not in any distress. Speech Clear. Not toxic looking HEENT: Atraumatic and Normocephalic, pupils equally reactive to light and accomodation Neck: Supple, no JVD. No cervical lymphadenopathy.  Chest: Good air entry bilaterally, no added sounds  CVS: S1 S2 regular, no murmurs.  Abdomen: Bowel sounds present, Non tender and not distended with no gaurding, rigidity or rebound. Extremities: B/L Lower Ext shows no edema, both legs are warm to touch Neurology: Awake alert, and oriented X 3, Non focal Skin: No Rash  Data Review Lab Results  Component Value Date   HGBA1C 5.4 10/05/2021   HGBA1C 5.5 03/24/2021   HGBA1C 5.4 11/17/2020    Assessment & Plan  Christina Byrd was seen today for knee pain and hip pain.  Diagnoses and all orders for this visit:  Idiopathic chronic gout of right knee without tophus Gout is an inflammatory arthritis related to a hyperuricemia.   Risk factors greater than 40, female gender, increase in purines red meats and seafoods, alcohol smoking, and obesity greater than 30. Patient drinks alcohol every evening stated causes of gout flare up   Primary osteoarthritis of left knee 2/2 Left hip pain AMB referral to orthopedics     HYPERTENSION, BENIGN ESSENTIAL  Blood pressure today is 98/67 after review is the lowest BP reading.  She states she takes all her medications every day. Isolated low blood pressure will monitor closely if remains low will consider decreasing   or discontinuing clonidine 0.1 mg daily.  Left ear pain  Sweet oil   Patient have been counseled extensively about nutrition and exercise. Other issues discussed during this visit include: low cholesterol diet, weight control and daily exercise, foot care, annual eye examinations at Ophthalmology, importance of adherence with medications and regular follow-up. We also discussed long term complications of uncontrolled diabetes and hypertension.   Return in about 3 months (around 04/15/2022) for Bp/fasting labs.  The patient was given clear instructions to go to ER or return to medical center if symptoms don't improve, worsen or new problems develop. The patient verbalized understanding. The patient was told to call to get lab results if they haven't heard anything in the next week.   This note has been created with Surveyor, quantity. Any transcriptional errors are unintentional.   Kerin Perna, NP 01/13/2022, 4:18 PM

## 2022-01-15 LAB — MICROALBUMIN / CREATININE URINE RATIO
Creatinine, Urine: 360 mg/dL
Microalb/Creat Ratio: 17 mg/g creat (ref 0–29)
Microalbumin, Urine: 59.4 ug/mL

## 2022-01-25 ENCOUNTER — Ambulatory Visit (INDEPENDENT_AMBULATORY_CARE_PROVIDER_SITE_OTHER): Payer: Medicaid Other | Admitting: Primary Care

## 2022-01-25 ENCOUNTER — Encounter (INDEPENDENT_AMBULATORY_CARE_PROVIDER_SITE_OTHER): Payer: Self-pay | Admitting: Primary Care

## 2022-01-25 VITALS — BP 106/73 | HR 81 | Temp 97.9°F | Resp 20 | Wt 137.0 lb

## 2022-01-25 DIAGNOSIS — G8929 Other chronic pain: Secondary | ICD-10-CM | POA: Diagnosis not present

## 2022-01-25 DIAGNOSIS — K0889 Other specified disorders of teeth and supporting structures: Secondary | ICD-10-CM | POA: Diagnosis not present

## 2022-01-25 DIAGNOSIS — M1A29X1 Drug-induced chronic gout, multiple sites, with tophus (tophi): Secondary | ICD-10-CM | POA: Diagnosis not present

## 2022-01-25 DIAGNOSIS — M25562 Pain in left knee: Secondary | ICD-10-CM | POA: Diagnosis not present

## 2022-01-25 DIAGNOSIS — M25561 Pain in right knee: Secondary | ICD-10-CM | POA: Diagnosis not present

## 2022-01-25 DIAGNOSIS — R634 Abnormal weight loss: Secondary | ICD-10-CM

## 2022-01-25 DIAGNOSIS — E119 Type 2 diabetes mellitus without complications: Secondary | ICD-10-CM | POA: Diagnosis not present

## 2022-01-25 NOTE — Progress Notes (Signed)
[place], is a 56 y.o. female  OZD:[id number]  QVZ:[id number]  DOB - [birth year]  Chief Complaint  Patient presents with   Knee Pain   Gout       Subjective:  Christina Byrd is a 56 y.o. female here today for complaints and concerns that she is unable to get her medication for pain- gabapentin or allopurinol due to cost. We have discussed the causes for gout flare up but she still has her "sip" at night. She also is losing weight due to needing teeth removed and like to be referred to another dentist showed writer she has a [employer] for dental.  Patient has No headache, No chest pain, No abdominal pain - No Nausea, No new weakness tingling or numbness, No Cough - shortness of breath  No problems updated.  Allergies  Allergen Reactions   Losartan Potassium     Rash   Levaquin [Levofloxacin In D5w] Rash   Ace Inhibitors Cough    REACTION: cough   Codeine Nausea Only   Cefepime Rash    09/04/12 pm Patient started to break out with small macules after IV Vanco infusion, then macules increased in size after starting cefepime infusion.   Vancomycin Rash    09/04/12 pm Patient started to break out with small macules after IV Vanco infusion, then macules increased in size after starting cefepime infusion.    Past Medical History:  Diagnosis Date   GASTROESOPHAGEAL REFLUX, NO ESOPHAGITIS 04/21/2006   Qualifier: Diagnosis of  By: Benna Dunks     GERD (gastroesophageal reflux disease) Dx 1995   HTN (hypertension)     Current Outpatient Medications on File Prior to Visit  Medication Sig Dispense Refill   amLODipine (NORVASC) 10 MG tablet Take 1 tablet (10 mg total) by mouth daily. 90 tablet 1   aspirin EC 81 MG tablet Take 1 tablet (81 mg total) by mouth daily.     atorvastatin (LIPITOR) 20 MG tablet Take 1 tablet (20 mg total) by mouth daily. 90 tablet 1   benzocaine (HURRICAINE) 20 % GEL Use as directed 1 Application in the mouth or  throat 4 (four) times daily as needed. 30 g 0   Blood Glucose Monitoring Suppl (TRUE METRIX METER) DEVI 1 kit by Does not apply route 3 (three) times daily. CHECK BLOOD SUGAR UP TO 3 TIMES DAILY. E11.9 1 each 0   cloNIDine (CATAPRES) 0.1 MG tablet TAKE 1 TABLET(0.1 MG) BY MOUTH AT BEDTIME AS NEEDED FOR HOT FLASHES 90 tablet 1   diclofenac (VOLTAREN) 75 MG EC tablet TAKE 1 TABLET(75 MG) BY MOUTH TWICE DAILY 180 tablet 1   fenofibrate (TRICOR) 48 MG tablet Take 1 tablet (48 mg total) by mouth daily. 30 tablet 6   fluticasone (FLONASE) 50 MCG/ACT nasal spray USE 2 SPRAYS IN EACH NOSTRIL DAILY 16 g 1   folic acid (FOLVITE) 1 MG tablet Take 1 tablet (1 mg total) by mouth daily. 30 tablet 11   glucose blood (TRUE METRIX BLOOD GLUCOSE TEST) test strip Use as instructed 100 each 12   ketoconazole (NIZORAL) 2 % cream Apply 1 Application topically daily.     levETIRAcetam (KEPPRA) 500 MG tablet Take 1 tablet (500 mg total) by mouth 2 (two) times daily. 180 tablet 3   LORazepam (ATIVAN) 0.5 MG tablet take 1 tab by mouth once 30 minutes before MRI 1 tablet 0   magnesium oxide (MAG-OX) 400 (240 Mg) MG tablet TAKE 1 TABLET BY MOUTH  EVERY DAY 90 tablet 0   naproxen (NAPROSYN) 500 MG tablet Take 1 tablet (500 mg total) by mouth 2 (two) times daily. 30 tablet 0   omega-3 acid ethyl esters (LOVAZA) 1 g capsule Take 2 capsules (2 g total) by mouth 2 (two) times daily. 60 capsule 6   pantoprazole (PROTONIX) 40 MG tablet Take 1 tablet (40 mg total) by mouth daily. 90 tablet 1   thiamine (VITAMIN B-1) 100 MG tablet Take 1 tablet (100 mg total) by mouth daily. 30 tablet 11   tiZANidine (ZANAFLEX) 4 MG tablet TAKE 1 TABLET BY MOUTH EVERY 8 HOURS AS NEEDED FOR MUSCLE SPASMS 120 tablet 1   TRUEplus Lancets 28G MISC SMARTSIG:Topical 1 to 3 Times Daily 100 each 3   venlafaxine XR (EFFEXOR XR) 37.5 MG 24 hr capsule Take 1 capsule (37.5 mg total) by mouth daily with breakfast. For hot flashes 90 capsule 1   vitamin B-12  (CYANOCOBALAMIN) 1000 MCG tablet Take 1 tablet (1,000 mcg total) by mouth daily. 30 tablet 11   allopurinol (ZYLOPRIM) 100 MG tablet Take 1 tablet (100 mg total) by mouth daily. (Patient not taking: Reported on 01/25/2022) 30 tablet 6   [DISCONTINUED] metFORMIN (GLUCOPHAGE) 500 MG tablet Take 0.5 tablets (250 mg total) by mouth daily with breakfast. 45 tablet 6   No current facility-administered medications on file prior to visit.   Objective:   Vitals:   01/25/22 1432  BP: 106/73  Pulse: 81  Resp: 20  Temp: 97.9 F (36.6 C)  TempSrc: Oral  SpO2: 98%  Weight: 137 lb (62.1 kg)   Exam General appearance : Awake, alert, not in any distress. Speech Clear. Not toxic looking HEENT: Atraumatic and Normocephalic, pupils equally reactive to light and accomodation Neck: Supple, no JVD. No cervical lymphadenopathy.  Chest: Good air entry bilaterally, no added sounds  CVS: S1 S2 regular, no murmurs.  Abdomen: Bowel sounds present, Non tender and not distended with no gaurding, rigidity or rebound. Extremities: B/L Lower Ext shows no edema, both legs are warm to touch Neurology: Awake alert, and oriented X 3,  Non focal Skin: No Rash  Data Review Lab Results  Component Value Date   HGBA1C 5.4 10/05/2021   HGBA1C 5.5 03/24/2021   HGBA1C 5.4 11/17/2020    Assessment & Plan  Christina Byrd was seen today for knee pain and gout.  Diagnoses and all orders for this visit:  Loss of weight 2/2 Pain, dental Wants all teeth remove and get a set of dentures. 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  Patient have been counseled extensively about nutrition and exercise. Other issues discussed during this  visit include: low cholesterol diet, weight control and daily exercise, foot care, annual eye examinations at Ophthalmology, importance of adherence with medications and regular follow-up. We also discussed long term complications of uncontrolled diabetes and hypertension.   Return for keep schedule appt.  The patient was given clear instructions to go to ER or return to medical center if symptoms don't improve, worsen or new problems develop. The patient verbalized understanding. The patient was told to call to get lab results if they haven't heard anything in the next week.   This note has  been created with Surveyor, quantity. Any transcriptional errors are unintentional.   Kerin Perna, NP 01/25/2022, 3:04 PM

## 2022-01-25 NOTE — Progress Notes (Signed)
Right knee pain- has not taken gout medication

## 2022-01-30 ENCOUNTER — Other Ambulatory Visit: Payer: Self-pay | Admitting: Family Medicine

## 2022-01-30 DIAGNOSIS — M25551 Pain in right hip: Secondary | ICD-10-CM

## 2022-02-01 ENCOUNTER — Encounter: Payer: Self-pay | Admitting: Physician Assistant

## 2022-02-01 ENCOUNTER — Ambulatory Visit (INDEPENDENT_AMBULATORY_CARE_PROVIDER_SITE_OTHER): Payer: No Typology Code available for payment source | Admitting: Primary Care

## 2022-02-01 ENCOUNTER — Ambulatory Visit (INDEPENDENT_AMBULATORY_CARE_PROVIDER_SITE_OTHER): Payer: Medicaid Other | Admitting: Physician Assistant

## 2022-02-01 DIAGNOSIS — M25561 Pain in right knee: Secondary | ICD-10-CM | POA: Diagnosis not present

## 2022-02-01 DIAGNOSIS — M25461 Effusion, right knee: Secondary | ICD-10-CM | POA: Diagnosis not present

## 2022-02-01 NOTE — Progress Notes (Signed)
Office Visit Note   Patient: Christina Byrd           Date of Birth: 08-Mar-1965           MRN: [id number] Visit Date: 02/01/2022              Requested by: Kerin Perna, NP [address] [place],  [place] [address] PCP: Kerin Perna, NP   Assessment & Plan: Visit Diagnoses:  1. Right knee pain, unspecified chronicity   2. Effusion, right knee     Plan: We were able to aspirate her knee today and we sent this aspirate for crystals culture sensitivity.  Aspirate was 5 cc of synovial fluid with slight blood tinge.  Follow-Up Instructions: Return in about 1 week (around 02/08/2022).   Orders:  Orders Placed This Encounter  Procedures   Anaerobic and Aerobic Culture   Synovial Fluid Analysis, Complete   No orders of the defined types were placed in this encounter.     Procedures: No procedures performed   Clinical Data: No additional findings.   Subjective: Chief Complaint  Patient presents with   Right Knee - Pain   Right Hip - Pain    HPI Mrs. Christina Byrd comes in today with right hip pain and right knee pain.  She states that the pains been ongoing for over a year.  Worse with cold and rainy weather likely had this weekend.  She states it hurts to bend her knee.  She does note that her leg goes numb at times and goes down to the ankle.  She notes that her right knee has swelling.  She has had no known injury.  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  She states that she has been given a medicine in the past that caused her paralysis but she is unsure of what this medicine was.  Affected her right side.  She also notes some numbness in her right hand and weakness of the right hand at times.  She states that she does not like her clothing touching her knee due to the pain and discomfort.  She denies any redness around the knee though.  Prior radiographs of her right knee is showing bone-on-bone medial compartment and moderate to  severe patellofemoral changes. She is having some dental issues and reports that her gums are swollen after having a dental procedure.  She is trying to find another dentist to take over her care.  Review of Systems Negative for fevers or chills.  Objective: Vital Signs: LMP 08/23/2011   Physical Exam Constitutional:      Appearance: She is not ill-appearing or diaphoretic.  Neurological:     Mental Status: She is alert.     Ortho Exam Right hip tenderness over the trochanteric region down the IT band.  Any attempts of range of motion right hip or right knee she has excruciating pain.  There is no abnormal warmth erythema about the knee or the hip.  Calf is supple compartments are supple throughout.  Right knee global tenderness.  Hyperparesthesia over the knee with any attempts at palpation.  Slight effusion right knee. Specialty Comments:  No specialty comments available.  Imaging: No results found.   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   Pain and swelling of left ankle 12/02/2014   Tendinitis of right hip flexor 07/23/2014   Allergic rhinitis 05/13/2014   GERD (gastroesophageal reflux disease) 04/01/2014   Tendonitis, Achilles, right 04/01/2014   Osteoarthritis of left knee 12/11/2013   Ex-smoker 11/09/2008   OBESITY 08/12/2006   HYPERTENSION, BENIGN ESSENTIAL 08/12/2006   Past  Medical History:  Diagnosis Date   GASTROESOPHAGEAL REFLUX, NO ESOPHAGITIS 04/21/2006   Qualifier: Diagnosis of  By: Levada Schilling     GERD (gastroesophageal reflux disease) Dx 1995   HTN (hypertension)     Family History  Problem Relation Age of Onset   CAD Mother     No past surgical history on file. Social History   Occupational History   Not on file  Tobacco Use   Smoking status: Former    Types: Cigarettes    Quit date: 06/25/2012    Years since quitting: 9.6   Smokeless tobacco: Never  Vaping Use   Vaping Use: Never used  Substance and Sexual Activity   Alcohol use: Yes    Alcohol/week: 3.0 standard drinks of alcohol    Types: 3 Standard drinks or equivalent per week    Comment: ADMITS TO DRINKING 3-4 BEERS/DAY   Drug use: No   Sexual activity: Not Currently

## 2022-02-04 ENCOUNTER — Other Ambulatory Visit: Payer: Self-pay

## 2022-02-04 ENCOUNTER — Emergency Department (HOSPITAL_COMMUNITY)
Admission: EM | Admit: 2022-02-04 | Discharge: 2022-02-04 | Disposition: A | Discharge status: Home / Self Care | Payer: Medicaid Other | Attending: Student | Admitting: Student

## 2022-02-04 ENCOUNTER — Encounter (HOSPITAL_COMMUNITY): Payer: Self-pay | Admitting: *Deleted

## 2022-02-04 DIAGNOSIS — I10 Essential (primary) hypertension: Secondary | ICD-10-CM | POA: Diagnosis not present

## 2022-02-04 DIAGNOSIS — Z7982 Long term (current) use of aspirin: Secondary | ICD-10-CM | POA: Diagnosis not present

## 2022-02-04 DIAGNOSIS — Z79899 Other long term (current) drug therapy: Secondary | ICD-10-CM | POA: Diagnosis not present

## 2022-02-04 DIAGNOSIS — K0889 Other specified disorders of teeth and supporting structures: Secondary | ICD-10-CM | POA: Insufficient documentation

## 2022-02-04 MED ORDER — BENZOCAINE 20 % MT GEL: 1.0000 | Freq: Two times a day (BID) | OROMUCOSAL | 0 refills | Status: AC | PRN

## 2022-02-04 MED ORDER — AMOXICILLIN-POT CLAVULANATE 875-125 MG PO TABS: 1.0000 | ORAL_TABLET | Freq: Two times a day (BID) | ORAL | 0 refills | Status: AC

## 2022-02-04 MED ORDER — KETOROLAC TROMETHAMINE 60 MG/2ML IM SOLN
30.0000 mg | Freq: Once | INTRAMUSCULAR | Status: DC
  Filled 2022-02-04: qty 2

## 2022-02-04 NOTE — ED Triage Notes (Signed)
The pt had some type cleaning in a dentist office 2-3 months ago  she has been having pain in her mouth since then  swollen  gums

## 2022-02-04 NOTE — Discharge Instructions (Addendum)
Likely you have a dental infection, starting you on antibiotics please take as prescribed.  also given you benzocaine which is a numbing gel please apply to the area as needed for pain.  May also take over-the-counter pain medication as needed like ibuprofen Tylenol recommend alternating between the 2 every 6 hours.  I have given information for a dentist please call for further evaluation.  Come back to the emergency department if you develop chest pain, shortness of breath, severe abdominal pain, uncontrolled nausea, vomiting, diarrhea.

## 2022-02-04 NOTE — ED Provider Notes (Signed)
[facility] EMERGENCY DEPARTMENT Provider Note   CSN: [id number] Arrival date & time: 02/04/22  [id number]     History  Chief Complaint  Patient presents with   Dental Pain    Christina Byrd is a 56 y.o. female.  HPI   Medical history including GERD, hypertension, acid reflux presents with complaints of dental pain, been going on since she had her teeth done about 2 months ago, pain is mainly in her right upper jaw, no facial swelling no pain with eye movement, no tongue throat lips no difficulty breathing.  No fevers no chills.  She has no other complaints.    Home Medications Prior to Admission medications   Medication Sig Start Date End Date Taking? Authorizing Provider  amoxicillin-clavulanate (AUGMENTIN) 875-125 MG tablet Take 1 tablet by mouth every 12 (twelve) hours for 7 days. 02/04/22 02/11/22 Yes Marcello Fennel, PA-C  benzocaine (HURRICAINE) 20 % GEL Use as directed 1 Application in the mouth or throat 2 (two) times daily as needed for up to 7 days. 02/04/22 02/11/22 Yes Marcello Fennel, PA-C  allopurinol (ZYLOPRIM) 100 MG tablet Take 1 tablet (100 mg total) by mouth daily. Patient not taking: Reported on 01/25/2022 01/13/22   Kerin Perna, NP  amLODipine (NORVASC) 10 MG tablet Take 1 tablet (10 mg total) by mouth daily. 10/05/21   Charlott Rakes, MD  aspirin EC 81 MG tablet Take 1 tablet (81 mg total) by mouth daily. 10/26/12   Rai, Vernelle Emerald, MD  atorvastatin (LIPITOR) 20 MG tablet Take 1 tablet (20 mg total) by mouth daily. 10/05/21   Charlott Rakes, MD  Blood Glucose Monitoring Suppl (TRUE METRIX METER) DEVI 1 kit by Does not apply route 3 (three) times daily. CHECK BLOOD SUGAR UP TO 3 TIMES DAILY. E11.9 02/27/19   Charlott Rakes, MD  cloNIDine (CATAPRES) 0.1 MG tablet TAKE 1 TABLET(0.1 MG) BY MOUTH AT BEDTIME AS NEEDED FOR HOT FLASHES 11/02/21   Charlott Rakes, MD  diclofenac (VOLTAREN) 75 MG EC tablet TAKE 1 TABLET(75 MG) BY MOUTH TWICE  DAILY 02/01/22   Kerin Perna, NP  fenofibrate (TRICOR) 48 MG tablet Take 1 tablet (48 mg total) by mouth daily. 10/06/21   Charlott Rakes, MD  fluticasone (FLONASE) 50 MCG/ACT nasal spray USE 2 SPRAYS IN EACH NOSTRIL DAILY 06/25/21   Charlott Rakes, MD  folic acid (FOLVITE) 1 MG tablet Take 1 tablet (1 mg total) by mouth daily. 07/28/20   Cameron Sprang, MD  glucose blood (TRUE METRIX BLOOD GLUCOSE TEST) test strip Use as instructed 03/06/20   Charlott Rakes, MD  ketoconazole (NIZORAL) 2 % cream Apply 1 Application topically daily.    [provider]  levETIRAcetam (KEPPRA) 500 MG tablet Take 1 tablet (500 mg total) by mouth 2 (two) times daily. 05/04/21   Cameron Sprang, MD  LORazepam (ATIVAN) 0.5 MG tablet take 1 tab by mouth once 30 minutes before MRI 05/06/21   Charlott Rakes, MD  magnesium oxide (MAG-OX) 400 (240 Mg) MG tablet TAKE 1 TABLET BY MOUTH EVERY DAY 06/24/21   Charlott Rakes, MD  omega-3 acid ethyl esters (LOVAZA) 1 g capsule Take 2 capsules (2 g total) by mouth 2 (two) times daily. 10/06/21   Charlott Rakes, MD  pantoprazole (PROTONIX) 40 MG tablet Take 1 tablet (40 mg total) by mouth daily. 10/05/21   Charlott Rakes, MD  thiamine (VITAMIN B-1) 100 MG tablet Take 1 tablet (100 mg total) by mouth daily. 07/28/20  Cameron Sprang, MD  tiZANidine (ZANAFLEX) 4 MG tablet TAKE 1 TABLET BY MOUTH EVERY 8 HOURS AS NEEDED FOR MUSCLE SPASMS 11/02/21   Charlott Rakes, MD  TRUEplus Lancets 28G MISC SMARTSIG:Topical 1 to 3 Times Daily 03/06/20   Charlott Rakes, MD  venlafaxine XR (EFFEXOR XR) 37.5 MG 24 hr capsule Take 1 capsule (37.5 mg total) by mouth daily with breakfast. For hot flashes 10/05/21   Charlott Rakes, MD  vitamin B-12 (CYANOCOBALAMIN) 1000 MCG tablet Take 1 tablet (1,000 mcg total) by mouth daily. 07/28/20   Cameron Sprang, MD  metFORMIN (GLUCOPHAGE) 500 MG tablet Take 0.5 tablets (250 mg total) by mouth daily with breakfast. 03/05/20 04/02/20  Argentina Donovan, PA-C       Allergies    Losartan potassium, Levaquin [levofloxacin in d5w], Ace inhibitors, Codeine, Cefepime, and Vancomycin    Review of Systems   Review of Systems  Constitutional:  Negative for chills and fever.  HENT:  Positive for dental problem.   Respiratory:  Negative for shortness of breath.   Cardiovascular:  Negative for chest pain.  Gastrointestinal:  Negative for abdominal pain.  Neurological:  Negative for headaches.    Physical Exam Updated Vital Signs BP 111/87   Pulse 80   Temp 98.8 F (37.1 C)   Resp 20   Ht 5' 2" (1.575 m)   Wt 62.1 kg   LMP 08/23/2011   SpO2 97%   BMI 25.04 kg/m  Physical Exam Vitals and nursing note reviewed.  Constitutional:      General: She is not in acute distress.    Appearance: Normal appearance. She is not ill-appearing or diaphoretic.  HENT:     Head: Normocephalic and atraumatic.     Nose: No congestion or rhinorrhea.     Mouth/Throat:     Mouth: Mucous membranes are moist.     Pharynx: Oropharynx is clear. No oropharyngeal exudate or posterior oropharyngeal erythema.     Comments: No facial swelling, no trismus no torticollis, tongue uvula both midline controlling oral secretions tonsils are both equal symmetric bilaterally, she has no upper teeth, she has some slight erythema noted where the right canine would be, no fluctuance or induration present.  There is no submandibular swelling. Eyes:     Extraocular Movements: Extraocular movements intact.     Conjunctiva/sclera: Conjunctivae normal.     Pupils: Pupils are equal, round, and reactive to light.  Cardiovascular:     Rate and Rhythm: Normal rate.  Pulmonary:     Effort: Pulmonary effort is normal.  Musculoskeletal:     Cervical back: Neck supple.  Skin:    General: Skin is warm and dry.  Neurological:     Mental Status: She is alert.  Psychiatric:        Mood and Affect: Mood normal.     ED Results / Procedures / Treatments   Labs (all labs ordered are listed,  but only abnormal results are displayed) Labs Reviewed - No data to display  EKG None  Radiology No results found.  Procedures Procedures    Medications Ordered in ED Medications  ketorolac (TORADOL) injection 30 mg (has no administration in time range)    ED Course/ Medical Decision Making/ A&P                           Medical Decision Making  This patient presents to the ED for concern of dental pain, this involves an  extensive number of treatment options, and is a complaint that carries with it a high risk of complications and morbidity.  The differential diagnosis includes Ludwig angina, retropharyngeal/peritonsillar abscess    Additional history obtained:  Additional history obtained from family member at bedside External records from outside source obtained and reviewed including recent ER notes   Co morbidities that complicate the patient evaluation  N/a  Social Determinants of Health:  N/a    Lab Tests:  I Ordered, and personally interpreted labs.  The pertinent results include:  n/a   Imaging Studies ordered:  I ordered imaging studies including n/a  I independently visualized and interpreted imaging which showed n/a I agree with the radiologist interpretation   Cardiac Monitoring:  The patient was maintained on a cardiac monitor.  I personally viewed and interpreted the cardiac monitored which showed an underlying rhythm of: n/a   Medicines ordered and prescription drug management:  I ordered medication including n/a I have reviewed the patients home medicines and have made adjustments as needed  Critical Interventions:  N/a   Reevaluation:  Presents with dental pain benign physical exam agreement discharge at this time.  Consultations Obtained:  N/A    Test Considered:  CT maxillofacial-deferred to my suspicion for facial infection/abscess low at this time there is no obvious infection present my exam.    Rule out I  have low suspicion for peritonsillar abscess, retropharyngeal abscess, or Ludwig angina as oropharynx was visualized tongue and uvula were both midline, there is no exudates, erythema or edema noted in the posterior pillars or on/ around tonsils.  Low suspicion for an abscess as gumline were palpated no fluctuance or induration felt.  Low suspicion for periorbital or orbital cellulitis as patient face had no erythematous, patient EOMs were intact, he had no pain with eye movement.     Dispostion and problem list  After consideration of the diagnostic results and the patients response to treatment, I feel that the patent would benefit from discharge.  Dental pain-suspect there is underlying infection  will start on antibiotics, have her follow-up with a dentist for further evaluation and strict return precautions.  This in the patient has a allergy to cephalosporins, I reviewed her chart she has had Augmentin in the past for the without any reaction.  Patient also clarified that she took this medication out any difficulty.            Final Clinical Impression(s) / ED Diagnoses Final diagnoses:  Pain, dental    Rx / DC Orders ED Discharge Orders          Ordered    amoxicillin-clavulanate (AUGMENTIN) 875-125 MG tablet  Every 12 hours        02/04/22 0525    benzocaine (HURRICAINE) 20 % GEL  2 times daily PRN        02/04/22 0525              Marcello Fennel, PA-C 02/04/22 [place], Calvin, DO 02/04/22 [phone]

## 2022-02-07 LAB — SYNOVIAL FLUID ANALYSIS, COMPLETE
Basophils, %: 0 %
Eosinophils-Synovial: 0 % (ref 0–2)
Lymphocytes-Synovial Fld: 42 % (ref 0–74)
Monocyte/Macrophage: 6 % (ref 0–69)
Neutrophil, Synovial: 52 % — ABNORMAL HIGH (ref 0–24)
Synoviocytes, %: 0 % (ref 0–15)
WBC, Synovial: 523 cells/uL — ABNORMAL HIGH (ref <150)

## 2022-02-07 LAB — ANAEROBIC AND AEROBIC CULTURE
AER RESULT:: NO GROWTH
GRAM STAIN:: NONE SEEN
MICRO NUMBER:: 14297280
MICRO NUMBER:: 14297281
SPECIMEN QUALITY:: ADEQUATE
SPECIMEN QUALITY:: ADEQUATE

## 2022-02-11 ENCOUNTER — Ambulatory Visit (INDEPENDENT_AMBULATORY_CARE_PROVIDER_SITE_OTHER): Payer: Medicaid Other

## 2022-02-11 ENCOUNTER — Ambulatory Visit (INDEPENDENT_AMBULATORY_CARE_PROVIDER_SITE_OTHER): Payer: Medicaid Other | Admitting: Physician Assistant

## 2022-02-11 ENCOUNTER — Encounter: Payer: Self-pay | Admitting: Physician Assistant

## 2022-02-11 DIAGNOSIS — M25561 Pain in right knee: Secondary | ICD-10-CM

## 2022-02-11 NOTE — Progress Notes (Signed)
:   Mrs. Christina Byrd comes in today for follow-up of her right knee status post aspiration 1 week ago.  She states her knee pain is overall improved.  Aspirate culture showed no organisms no crystals.  She states that her knee is stiff after she sits for a while.  She does note that the right knee gives way at times.She is undergoing some treatment still for her gum disease.    Review of systems: Negative for fevers chills.  Physical exam: General well-developed well-nourished female no acute distress.  Right knee: Good range of motion. No abnormal warmth erythema no effusion.  Tenderness along medial joint line.  No hyperparesthesia.  Radiographs: Right knee:No acute fractures.  Mild varus malalignment.  Near bone-on-bone medial compartment moderate severe to severe patellofemoral arthritic changes and mild lateral compartmental narrowing.  No bony abnormalities otherwise.  Impression: Right knee tricompartmental arthritis  Plan: Discussed with her treatment would be cortisone injection however she is still being treated for infection in her mouth and therefore would not perform this at this time.  Only other treatment she is to continue her Voltaren orally and Voltaren gel topically.  If her pain persist despite conservative measures she would be a candidate for total knee replacement.  Questions were encouraged and

## 2022-03-11 ENCOUNTER — Other Ambulatory Visit: Payer: Self-pay | Admitting: Family Medicine

## 2022-03-11 DIAGNOSIS — I10 Essential (primary) hypertension: Secondary | ICD-10-CM

## 2022-03-11 DIAGNOSIS — K219 Gastro-esophageal reflux disease without esophagitis: Secondary | ICD-10-CM

## 2022-03-11 DIAGNOSIS — E119 Type 2 diabetes mellitus without complications: Secondary | ICD-10-CM

## 2022-03-12 NOTE — Telephone Encounter (Signed)
Requested Prescriptions  Pending Prescriptions Disp Refills   pantoprazole (PROTONIX) 40 MG tablet [Pharmacy Med Name: PANTOPRAZOLE 40MG  TABLETS] 90 tablet 1    Sig: TAKE 1 TABLET(40 MG) BY MOUTH DAILY     Gastroenterology: Proton Pump Inhibitors Passed - 03/11/2022  5:27 PM      Passed - Valid encounter within last 12 months    Recent Outpatient Visits           1 month ago Loss of weight   [place] Kerin Perna, NP   1 month ago Left hip pain   [place] Kerin Perna, NP   2 months ago Pain, dental   [place] Ladell Pier, MD   3 months ago Encounter to establish care   [place], Michelle P, NP   5 months ago Right hip pain   [place], Enobong, MD       Future Appointments             In 1 month [place], Milford Cage, NP [place]   In 1 month Edwards, Milford Cage, NP [facility]             amLODipine ([place]) 10 MG tablet [Pharmacy Med Name: AMLODIPINE BESYLATE 10MG  TABLETS] 90 tablet 1    Sig: TAKE 1 TABLET(10 MG) BY MOUTH DAILY     Cardiovascular: Calcium Channel Blockers 2 Passed - 03/11/2022  5:27 PM      Passed - Last BP in normal range    BP Readings from Last 1 Encounters:  02/04/22 111/87         Passed - Last Heart Rate in normal range    Pulse Readings from Last 1 Encounters:  02/04/22 80         Passed - Valid encounter within last 6 months    Recent Outpatient Visits           1 month ago Loss of weight   [place] Kerin Perna, NP   1 month ago Left hip pain   [place] Kerin Perna, NP   2 months ago Pain, dental   [place], MD   3 months ago Encounter to establish care   [place], Michelle P, NP   5 months ago Right hip pain   [place], Enobong, MD       Future Appointments             In 1 month Edwards, Milford Cage, NP [place]   In 1 month Edwards, Milford Cage, NP [facility]             atorvastatin (LIPITOR) 20 MG tablet [Pharmacy Med Name: ATORVASTATIN 20MG  TABLETS] 90 tablet 1    Sig: TAKE 1 TABLET(20 MG) BY MOUTH DAILY     Cardiovascular:  Antilipid - Statins Failed - 03/11/2022  5:27 PM      Failed - Lipid Panel in normal range within the last 12 months    Cholesterol, Total  Date Value Ref Range Status  10/05/2021 119 100 - 199 mg/dL Final   LDL Chol Calc (NIH)  Date Value Ref Range Status  10/05/2021 11 0 -  99 mg/dL Final   Direct LDL  Date Value Ref Range Status  01/30/2008 153 (H) mg/dL Final    Comment:    See lab report for associated comment(s)   HDL  Date Value Ref Range Status  10/05/2021 64 >39 mg/dL Final   Triglycerides  Date Value Ref Range Status  10/05/2021 323 (H) 0 - 149 mg/dL Final         Passed - Patient is not pregnant      Passed - Valid encounter within last 12 months    Recent Outpatient Visits           1 month ago Loss of weight   [place] Kerin Perna, NP   1 month ago Left hip pain   [place] Kerin Perna, NP   2 months ago Pain, dental   [place], MD   3 months ago Encounter to establish care   [place], Michelle P, NP   5 months ago Right hip pain   [place], Enobong, MD       Future Appointments             In 1 month Olet[place] Lamas, Milford Cage, NP [place]   In 1 month Kerin Perna, NP [place]

## 2022-03-31 ENCOUNTER — Telehealth (INDEPENDENT_AMBULATORY_CARE_PROVIDER_SITE_OTHER): Payer: Self-pay | Admitting: Primary Care

## 2022-03-31 NOTE — Telephone Encounter (Signed)
Created in error

## 2022-04-05 ENCOUNTER — Ambulatory Visit (INDEPENDENT_AMBULATORY_CARE_PROVIDER_SITE_OTHER): Payer: Self-pay | Admitting: *Deleted

## 2022-04-05 NOTE — Telephone Encounter (Signed)
Attempted to reach pt, call cannot be completed at this time.

## 2022-04-05 NOTE — Telephone Encounter (Signed)
Antibiotic request   Patient states that she had all of her teeth pulled on 2/5 and the dentist gave her clindamycin capsules. Patient states that she cannot takes capsules because they make her choke and itch. Patient would like to know if pcp can give her an antibiotic in liquid form. Patient states that her throat is itching. Patient states that her friend gave her some Nystatin oral suspension and that helped with the itch.       Attempted to reach pt, "Call cannot be completed at this time."

## 2022-04-05 NOTE — Telephone Encounter (Signed)
Third attempt to reach pt, routing to practice for PCP's resolution per protocol.  

## 2022-04-07 ENCOUNTER — Ambulatory Visit (INDEPENDENT_AMBULATORY_CARE_PROVIDER_SITE_OTHER): Payer: Self-pay | Admitting: *Deleted

## 2022-04-07 NOTE — Telephone Encounter (Signed)
Will forward to provider  

## 2022-04-07 NOTE — Telephone Encounter (Signed)
  Chief Complaint: Unable to take the Clindamycin capsules the oral surgeon prescribed.  She had 9 teeth pulled on 03/30/2022.   She can't get in contact with him.  She is wanting to know if Juluis Mire, NP can prescribe something for her infection in her mouth. Symptoms: Everything tastes like salt and she has infection in her mouth Frequency: Since the oral surgeon pulled her teeth Pertinent Negatives: Patient denies being able to contact the oral surgeon. Disposition: [] ED /[] Urgent Care (no appt availability in office) / [] Appointment(In office/virtual)/ []  [facility]/ [] Home Care/ [] Refused Recommended Disposition /[] [place] [facility]/ [x]  Follow-up with PCP Additional Notes: I let her know I would send her request to Juluis Mire, NP and someone would call her back.    She mentioned she called yesterday but no one called her back.   I encouraged her to continue trying to contact the oral surgeon too.

## 2022-04-07 NOTE — Telephone Encounter (Signed)
I called yesterday.   No one called me back.    I need antibiotics for my mouth.   I had 9 teeth pulled.   The antibiotics they prescribed for me I can't take.   It's a capsule and I can't take capsules.   The pharmacy said to open the capsule and take it with apple sauce.   I did that but I started itching.   I've called the doctor that pulled my tooth but I can't get a hold of him and no one returns my call.   The oral surgeon said I had bad infection in my  mouth.   He prescribed Clindamycin capsules.     Reason for Disposition  [1] Caller has URGENT medicine question about med that PCP or specialist prescribed AND [2] triager unable to answer question  Answer Assessment - Initial Assessment Questions 1. NAME of MEDICINE: "What medicine(s) are you calling about?"     Clindamycin capsules  2. QUESTION: "What is your question?" (e.g., double dose of medicine, side effect)     I can't take capsules.    I still have stitches in my mouth.   Everything tastes like salt.   I had 9 teeth pulled.   Last Tuesday is when I had my teeth pulled 03/30/2022.    3. PRESCRIBER: "Who prescribed the medicine?" Reason: if prescribed by specialist, call should be referred to that group.     Oral surgeon 4. SYMPTOMS: "Do you have any symptoms?" If Yes, ask: "What symptoms are you having?"  "How bad are the symptoms (e.g., mild, moderate, severe)     I have infection in my mouth.    5. PREGNANCY:  "Is there any chance that you are pregnant?" "When was your last menstrual period?"     N/A due to age.  Protocols used: Medication Question Call-A-AH

## 2022-04-08 NOTE — Telephone Encounter (Signed)
Unable to treat needs to go to oral surgeon in person if unavailable by phone.

## 2022-04-08 NOTE — Telephone Encounter (Signed)
Returned pt call and provided provider response pt states she understands and doesn't have any questions or concerns

## 2022-04-12 ENCOUNTER — Ambulatory Visit (INDEPENDENT_AMBULATORY_CARE_PROVIDER_SITE_OTHER): Payer: BLUE CROSS/BLUE SHIELD | Admitting: Primary Care

## 2022-04-12 ENCOUNTER — Ambulatory Visit: Payer: No Typology Code available for payment source | Admitting: Family Medicine

## 2022-04-12 ENCOUNTER — Encounter (INDEPENDENT_AMBULATORY_CARE_PROVIDER_SITE_OTHER): Payer: Self-pay | Admitting: Primary Care

## 2022-04-12 ENCOUNTER — Ambulatory Visit: Payer: No Typology Code available for payment source | Admitting: Internal Medicine

## 2022-04-12 VITALS — BP 99/74 | HR 118 | Resp 16 | Ht 62.0 in | Wt 130.8 lb

## 2022-04-12 DIAGNOSIS — K0889 Other specified disorders of teeth and supporting structures: Secondary | ICD-10-CM | POA: Diagnosis not present

## 2022-04-12 NOTE — Progress Notes (Signed)
[place], is a 57 y.o. female  [id number]  [id number]  DOB - [birth year]  No chief complaint on file.      Subjective:   Christina Byrd is a 57 y.o. female here today for a acute visit. She had oral surgery place on abt's- unable to swallow. Stated told dentist he said it was nothing else he could do. Lip is swollen and painful. Patient has No headache, No chest pain, No abdominal pain - No Nausea, No new weakness tingling or numbness, No Cough - shortness of breath. Called oral surgeon Dr. Aileen Pilot Simoncic @ [facility] asked was it ok to place writer on speaker so PA can listen ok Probation officer had them on speaker. He did not want to advise ask patient to come to the office and he would see her today. Information given to patient.  No problems updated.  Allergies  Allergen Reactions   Losartan Potassium     Rash   Levaquin [Levofloxacin In D5w] Rash   Ace Inhibitors Cough    REACTION: cough   Codeine Nausea Only   Cefepime Rash    09/04/12 pm Patient started to break out with small macules after IV Vanco infusion, then macules increased in size after starting cefepime infusion.   Vancomycin Rash    09/04/12 pm Patient started to break out with small macules after IV Vanco infusion, then macules increased in size after starting cefepime infusion.    Past Medical History:  Diagnosis Date   GASTROESOPHAGEAL REFLUX, NO ESOPHAGITIS 04/21/2006   Qualifier: Diagnosis of  By: Benna Dunks     GERD (gastroesophageal reflux disease) Dx 1995   HTN (hypertension)     Current Outpatient Medications on File Prior to Visit  Medication Sig Dispense Refill   allopurinol (ZYLOPRIM) 100 MG tablet Take 1 tablet (100 mg total) by mouth daily. (Patient not taking: Reported on 01/25/2022) 30 tablet 6   amLODipine (NORVASC) 10 MG tablet TAKE 1 TABLET(10 MG) BY MOUTH DAILY 90 tablet 1   aspirin EC 81 MG tablet Take 1 tablet (81 mg total) by mouth  daily.     atorvastatin (LIPITOR) 20 MG tablet TAKE 1 TABLET(20 MG) BY MOUTH DAILY 90 tablet 1   Blood Glucose Monitoring Suppl (TRUE METRIX METER) DEVI 1 kit by Does not apply route 3 (three) times daily. CHECK BLOOD SUGAR UP TO 3 TIMES DAILY. E11.9 1 each 0   cloNIDine (CATAPRES) 0.1 MG tablet TAKE 1 TABLET(0.1 MG) BY MOUTH AT BEDTIME AS NEEDED FOR HOT FLASHES 90 tablet 1   diclofenac (VOLTAREN) 75 MG EC tablet TAKE 1 TABLET(75 MG) BY MOUTH TWICE DAILY 180 tablet 1   fenofibrate (TRICOR) 48 MG tablet Take 1 tablet (48 mg total) by mouth daily. 30 tablet 6   fluticasone (FLONASE) 50 MCG/ACT nasal spray USE 2 SPRAYS IN EACH NOSTRIL DAILY 16 g 1   folic acid (FOLVITE) 1 MG tablet Take 1 tablet (1 mg total) by mouth daily. 30 tablet 11   glucose blood (TRUE METRIX BLOOD GLUCOSE TEST) test strip Use as instructed 100 each 12   ketoconazole (NIZORAL) 2 % cream Apply 1 Application topically daily.     levETIRAcetam (KEPPRA) 500 MG tablet Take 1 tablet (500 mg total) by mouth 2 (two) times daily. 180 tablet 3   LORazepam (ATIVAN) 0.5 MG tablet take 1 tab by mouth once 30 minutes before MRI 1 tablet 0   magnesium oxide (MAG-OX) 400 (240 Mg) MG tablet TAKE  1 TABLET BY MOUTH EVERY DAY 90 tablet 0   omega-3 acid ethyl esters (LOVAZA) 1 g capsule Take 2 capsules (2 g total) by mouth 2 (two) times daily. 60 capsule 6   pantoprazole (PROTONIX) 40 MG tablet TAKE 1 TABLET(40 MG) BY MOUTH DAILY 90 tablet 1   thiamine (VITAMIN B-1) 100 MG tablet Take 1 tablet (100 mg total) by mouth daily. 30 tablet 11   tiZANidine (ZANAFLEX) 4 MG tablet TAKE 1 TABLET BY MOUTH EVERY 8 HOURS AS NEEDED FOR MUSCLE SPASMS 120 tablet 1   TRUEplus Lancets 28G MISC SMARTSIG:Topical 1 to 3 Times Daily 100 each 3   venlafaxine XR (EFFEXOR XR) 37.5 MG 24 hr capsule Take 1 capsule (37.5 mg total) by mouth daily with breakfast. For hot flashes 90 capsule 1   vitamin B-12 (CYANOCOBALAMIN) 1000 MCG tablet Take 1 tablet (1,000 mcg total) by  mouth daily. 30 tablet 11   [DISCONTINUED] metFORMIN (GLUCOPHAGE) 500 MG tablet Take 0.5 tablets (250 mg total) by mouth daily with breakfast. 45 tablet 6   No current facility-administered medications on file prior to visit.    Objective:  There were no vitals filed for this visit.  Comprehensive ROS Pertinent positive and negative noted in HPI   Exam General appearance : Awake, alert, not in any distress. Speech Clear. Not toxic looking HEENT: Atraumatic and Normocephalic, pupils equally reactive to light and accomodation( no teeth at the bottom lip , checks swollen) difficulty chewing and can not swallow abts. (??? Oral) Neck: Supple, no JVD. No cervical lymphadenopathy.  Chest: Good air entry bilaterally, no added sounds  CVS: S1 S2 regular, no murmurs.  Abdomen: Bowel sounds present, Non tender and not distended with no gaurding, rigidity or rebound. Extremities: B/L Lower Ext shows no edema, both legs are warm to touch Neurology: Awake alert, and oriented X 3, CN II-XII intact, Non focal Skin: No Rash  Data Review Lab Results  Component Value Date   HGBA1C 5.4 10/05/2021   HGBA1C 5.5 03/24/2021   HGBA1C 5.4 11/17/2020    Assessment & Plan  Christina Byrd was seen today for dental problem.  Diagnoses and all orders for this visit:  Pain, dental See HPI   Patient have been counseled extensively about nutrition and exercise. Other issues discussed during this visit include: low cholesterol diet, weight control and daily exercise, foot care, annual eye examinations at Ophthalmology, importance of adherence with medications and regular follow-up. We also discussed long term complications of uncontrolled diabetes and hypertension.   No follow-ups on file.  The patient was given clear instructions to go to ER or return to medical center if symptoms don't improve, worsen or new problems develop. The patient verbalized understanding. The patient was told to call to get lab results  if they haven't heard anything in the next week.   This note has been created with Surveyor, quantity. Any transcriptional errors are unintentional.   Kerin Perna, NP 04/12/2022, 2:01 PM

## 2022-04-13 ENCOUNTER — Telehealth: Payer: Self-pay | Admitting: Emergency Medicine

## 2022-04-13 NOTE — Telephone Encounter (Signed)
Copied from [place] [id number]. Topic: General - Other >> Apr 13, 2022 10:04 AM Sabas Sous wrote: Reason for CRM: Pt called stating that she wants to share her experience seeing the provider her PCP recommended that she see. She went to [facility].  Best contact: [phone]  She says that she was told to go to the ED but she does not believe she should.

## 2022-04-14 NOTE — Telephone Encounter (Signed)
Patient came to the office and explained her experience yesterday

## 2022-04-15 ENCOUNTER — Ambulatory Visit (INDEPENDENT_AMBULATORY_CARE_PROVIDER_SITE_OTHER): Payer: No Typology Code available for payment source | Admitting: Primary Care

## 2022-04-15 ENCOUNTER — Telehealth (INDEPENDENT_AMBULATORY_CARE_PROVIDER_SITE_OTHER): Payer: Self-pay | Admitting: Primary Care

## 2022-04-15 NOTE — Telephone Encounter (Signed)
Attempted to reach patient to inform her the provider left sick, and we had to reschedule her appointment. Was not able to leave a message.

## 2022-04-27 ENCOUNTER — Ambulatory Visit (INDEPENDENT_AMBULATORY_CARE_PROVIDER_SITE_OTHER): Payer: BLUE CROSS/BLUE SHIELD | Admitting: Primary Care

## 2022-04-27 ENCOUNTER — Encounter (INDEPENDENT_AMBULATORY_CARE_PROVIDER_SITE_OTHER): Payer: Self-pay | Admitting: Primary Care

## 2022-04-27 VITALS — BP 124/74 | HR 102 | Resp 16 | Ht 62.0 in | Wt 128.4 lb

## 2022-04-27 DIAGNOSIS — E782 Mixed hyperlipidemia: Secondary | ICD-10-CM | POA: Diagnosis not present

## 2022-04-27 DIAGNOSIS — I10 Essential (primary) hypertension: Secondary | ICD-10-CM | POA: Diagnosis not present

## 2022-04-27 DIAGNOSIS — E539 Vitamin B deficiency, unspecified: Secondary | ICD-10-CM | POA: Diagnosis not present

## 2022-04-27 DIAGNOSIS — E119 Type 2 diabetes mellitus without complications: Secondary | ICD-10-CM

## 2022-04-27 DIAGNOSIS — K0889 Other specified disorders of teeth and supporting structures: Secondary | ICD-10-CM | POA: Diagnosis not present

## 2022-04-27 DIAGNOSIS — M1A0620 Idiopathic chronic gout, left knee, without tophus (tophi): Secondary | ICD-10-CM | POA: Diagnosis not present

## 2022-04-27 DIAGNOSIS — N951 Menopausal and female climacteric states: Secondary | ICD-10-CM | POA: Diagnosis not present

## 2022-04-28 ENCOUNTER — Other Ambulatory Visit: Payer: Self-pay | Admitting: Neurology

## 2022-04-28 ENCOUNTER — Ambulatory Visit (INDEPENDENT_AMBULATORY_CARE_PROVIDER_SITE_OTHER): Payer: Self-pay

## 2022-04-28 ENCOUNTER — Other Ambulatory Visit (INDEPENDENT_AMBULATORY_CARE_PROVIDER_SITE_OTHER): Payer: Self-pay

## 2022-04-28 DIAGNOSIS — I10 Essential (primary) hypertension: Secondary | ICD-10-CM

## 2022-04-28 LAB — SPECIMEN STATUS REPORT

## 2022-04-28 MED ORDER — AMLODIPINE BESYLATE 10 MG PO TABS: ORAL_TABLET | ORAL | 1 refills | Status: DC

## 2022-04-28 MED ORDER — FENOFIBRATE 48 MG PO TABS: 48.0000 mg | ORAL_TABLET | Freq: Every day | ORAL | 3 refills | Status: DC

## 2022-04-28 NOTE — Telephone Encounter (Signed)
Will forward to provider  

## 2022-04-28 NOTE — Telephone Encounter (Signed)
See TE dated 04/28/22- called [facility] and there were no refills of Amlodipine and Fenofibrate. Requested Prescriptions  Pending Prescriptions Disp Refills   amLODipine (NORVASC) 10 MG tablet 90 tablet 1    Sig: TAKE 1 TABLET(10 MG) BY MOUTH DAILY     Cardiovascular: Calcium Channel Blockers 2 Passed - 04/28/2022  4:41 PM      Passed - Last BP in normal range    BP Readings from Last 1 Encounters:  04/27/22 124/74         Passed - Last Heart Rate in normal range    Pulse Readings from Last 1 Encounters:  04/27/22 (!) 102         Passed - Valid encounter within last 6 months    Recent Outpatient Visits           Yesterday Pain, dental   [place], Michelle P, NP   2 weeks ago Pain, dental   [place], Michelle P, NP   3 months ago Loss of weight   [place] Kerin Perna, NP   3 months ago Left hip pain   [place] Kerin Perna, NP   3 months ago Pain, dental   [place] Karle Plumber B, MD               fenofibrate (TRICOR) 48 MG tablet 90 tablet 3    Sig: Take 1 tablet (48 mg total) by mouth daily.     Cardiovascular:  Antilipid - Fibric Acid Derivatives Failed - 04/28/2022  4:41 PM      Failed - AST in normal range and within 360 days    AST  Date Value Ref Range Status  04/27/2022 68 (H) 0 - 40 IU/L Final         Failed - Lipid Panel in normal range within the last 12 months    Cholesterol, Total  Date Value Ref Range Status  04/27/2022 154 100 - 199 mg/dL Final   LDL Chol Calc (NIH)  Date Value Ref Range Status  04/27/2022 48 0 - 99 mg/dL Final   Direct LDL  Date Value Ref Range Status  01/30/2008 153 (H) mg/dL Final    Comment:    See lab report for associated comment(s)   HDL  Date Value Ref Range Status  04/27/2022 87 >39 mg/dL Final   Triglycerides  Date  Value Ref Range Status  04/27/2022 109 0 - 149 mg/dL Final         Passed - ALT in normal range and within 360 days    ALT  Date Value Ref Range Status  04/27/2022 17 0 - 32 IU/L Final         Passed - Cr in normal range and within 360 days    Creat  Date Value Ref Range Status  03/21/2014 1.00 0.50 - 1.10 mg/dL Final   Creatinine, Ser  Date Value Ref Range Status  04/27/2022 0.84 0.57 - 1.00 mg/dL Final   Creatinine, POC  Date Value Ref Range Status  03/25/2017 300 mg/dL Final         Passed - HGB in normal range and within 360 days    Hemoglobin  Date Value Ref Range Status  04/27/2022 12.4 11.1 - 15.9 g/dL Final         Passed - HCT in normal range and within 360 days  Hematocrit  Date Value Ref Range Status  04/27/2022 37.6 34.0 - 46.6 % Final         Passed - PLT in normal range and within 360 days    Platelets  Date Value Ref Range Status  04/27/2022 210 150 - 450 x10E3/uL Final         Passed - WBC in normal range and within 360 days    WBC  Date Value Ref Range Status  04/27/2022 4.3 3.4 - 10.8 x10E3/uL Final  11/10/2017 8.7 4.0 - 10.5 K/uL Final         Passed - eGFR is 30 or above and within 360 days    GFR, Est African American  Date Value Ref Range Status  12/11/2013 >89 mL/min Final   GFR calc Af Amer  Date Value Ref Range Status  01/02/2020 61 >59 mL/min/1.73 Final    Comment:    **In accordance with recommendations from the NKF-ASN Task force,**   [facility] is in the process of updating its eGFR calculation to the   2021 CKD-EPI creatinine equation that estimates kidney function   without a race variable.    GFR, Est Non African American  Date Value Ref Range Status  12/11/2013 >89 mL/min Final    Comment:      The estimated GFR is a calculation valid for adults (>=14 years old) that uses the CKD-EPI algorithm to adjust for age and sex. It is   not to be used for children, pregnant women, hospitalized patients,    patients on  dialysis, or with rapidly changing kidney function. According to the NKDEP, eGFR >89 is normal, 60-89 shows mild impairment, 30-59 shows moderate impairment, 15-29 shows severe impairment and <15 is ESRD.     GFR calc non Af Amer  Date Value Ref Range Status  01/02/2020 53 (L) >59 mL/min/1.73 Final   GFR  Date Value Ref Range Status  12/18/2012 76.32 >60.00 mL/min Final   eGFR  Date Value Ref Range Status  04/27/2022 82 >59 mL/min/1.73 Final         Passed - Valid encounter within last 12 months    Recent Outpatient Visits           Yesterday Pain, dental   [place] [facility] Kerin Perna, NP   2 weeks ago Pain, dental   [place] [facility] Kerin Perna, NP   3 months ago Loss of weight   [place] [facility] Kerin Perna, NP   3 months ago Left hip pain   [place] [facility] Kerin Perna, NP   3 months ago Pain, dental   [place] Ladell Pier, MD

## 2022-04-28 NOTE — Telephone Encounter (Addendum)
Spoke with along with patient PCP. Patient voices that she has gone back to the oral surgeon (x 2)  Per patient Surgeon voiced that  there  was nothing else he could do. Patient also voiced that she still had the stiches in hr mouth and there not dissolving alike surgeon she they would. SEE NT Notes. Patient also has c/o shoulder , hip and knee pain. Patient advised to go to the ER. Patient voiced she was going to the ER

## 2022-04-28 NOTE — Telephone Encounter (Signed)
Pt called back stating that she has drank 1 bottle water and will drink "1 bottle every hour" stated the dizziness has improved. Stated she is going to see if her sx continue to improve before she decides to go ED.

## 2022-04-28 NOTE — Telephone Encounter (Signed)
Copied from [place] [id number]. Topic: General - Other >> Apr 28, 2022  4:09 PM Chapman Fitch wrote: Reason for CRM: pt wanted to let Sharyn Lull know she will see her oral surgeon on Friday to see when the stitched need to come out  and what else he can do / pt also wanted to ask Sharyn Lull if she should still go to the hospital to get some fluids today / please advise

## 2022-04-28 NOTE — Telephone Encounter (Signed)
Pt called back and stated she is "out of refill" [facility] and spoke to [employer]. Pt has refills left on all meds except amlodipine and fenofibrate.

## 2022-04-28 NOTE — Telephone Encounter (Signed)
Chief Complaint: lightheadedness and difficulty swallowing Symptoms: poor po fluid intake, dry mouth mouth pain Frequency: few weeks Pertinent Negatives: Patient denies fever, chest pain, vomiting, diarrhea bleeding Disposition: '[]'$ ED /'[]'$ Urgent Care (no appt availability in office) / '[]'$ Appointment(In office/virtual)/ '[]'$  [facility]/ '[]'$ Home Care/ '[x]'$ Refused Recommended Disposition /'[]'$ [facility]/ '[]'$  Follow-up with PCP Additional Notes: Team messaged Evelena Asa and requested call transfer to her. Reason for Disposition  [1] Swallowing difficulty AND [2] cause unknown  (Exception: Difficulty swallowing is a chronic symptom.)  [1] Drinking very little AND [2] dehydration suspected (e.g., no urine > 12 hours, very dry mouth, very lightheaded)    Dry mouth lightheaded  Answer Assessment - Initial Assessment Questions 1. DESCRIPTION: "Tell me more about this problem." "Are you  having trouble swallowing liquids, solids, or both?" "Any trouble with swallowing saliva (spit)?"     Having trouble swallowing pills and liquid 2. SEVERITY: "How bad is the swallowing difficulty?"  (e.g., Scale 1-10; or mild, moderate, severe)   - MILD (0-3): Occasional swallowing difficulty; has trouble swallowing certain types of foods or liquids.   - MODERATE (4-7): Frequent swallowing difficulty; only able to swallow small amounts of foods and fluids.   - SEVERE (8-10): Unable to swallow any foods, fluids, or saliva; sensation of "lump in throat" or "something stuck in throat", and frequent drooling or spitting may be present.     moderate 3. ONSET: "When did the swallowing problems begin?"      Since oral surgery 1 month 4. CAUSE: "What do you think is causing the problem?"  (e.g., dry mouth, food or pill stuck in throat, mouth pain, sore throat, progression of disease process such as dementia or Parkinson's disease).      Pt feels it is related to her surgery  5. CHRONIC or RECURRENT: "Is  this a new problem for you?"  If No, ask: "How long have you had this problem?" (e.g., days, weeks, months)      1 month 6. OTHER SYMPTOMS: "Do you have any other symptoms?" (e.g., chest pain, difficulty breathing, mouth sores, sore throat, swollen tongue, chest pain)     Sore mouth from surgery, stitches in place x 1 month, feels like she is choking, poor liquid po intake and dry mouth 7. PREGNANCY: "Is there any chance you are pregnant?" "When was your last menstrual period?"     N/a  Answer Assessment - Initial Assessment Questions 1. DESCRIPTION: "Describe your dizziness."     Feels lightheaded with standing and occasionally walking 2. LIGHTHEADED: "Do you feel lightheaded?" (e.g., somewhat faint, woozy, weak upon standing)     yes 3. VERTIGO: "Do you feel like either you or the room is spinning or tilting?" (i.e. vertigo)     no 4. SEVERITY: "How bad is it?"  "Do you feel like you are going to faint?" "Can you stand and walk?"   - MILD: Feels slightly dizzy, but walking normally.   - MODERATE: Feels unsteady when walking, but not falling; interferes with normal activities (e.g., school, work).   - SEVERE: Unable to walk without falling, or requires assistance to walk without falling; feels like passing out now.      mild 5. ONSET:  "When did the dizziness begin?"     Few weeks  6. AGGRAVATING FACTORS: "Does anything make it worse?" (e.g., standing, change in head position)     standng  8. CAUSE: "What do you think is causing the dizziness?"     Poor  po intake  10. OTHER SYMPTOMS: "Do you have any other symptoms?" (e.g., fever, chest pain, vomiting, diarrhea, bleeding)       Mouth pain, difficulty swallowing liquids and pills  Protocols used: Swallowing Difficulty-A-AH, Dizziness - Lightheadedness-A-AH

## 2022-04-29 LAB — CBC WITH DIFFERENTIAL/PLATELET
Basophils Absolute: 0 10*3/uL (ref 0.0–0.2)
Basos: 1 %
EOS (ABSOLUTE): 0 10*3/uL (ref 0.0–0.4)
Eos: 1 %
Hematocrit: 37.6 % (ref 34.0–46.6)
Hemoglobin: 12.4 g/dL (ref 11.1–15.9)
Immature Grans (Abs): 0 10*3/uL (ref 0.0–0.1)
Immature Granulocytes: 1 %
Lymphocytes Absolute: 1.5 10*3/uL (ref 0.7–3.1)
Lymphs: 35 %
MCH: 32.1 pg (ref 26.6–33.0)
MCHC: 33 g/dL (ref 31.5–35.7)
MCV: 97 fL (ref 79–97)
Monocytes Absolute: 0.4 10*3/uL (ref 0.1–0.9)
Monocytes: 8 %
Neutrophils Absolute: 2.4 10*3/uL (ref 1.4–7.0)
Neutrophils: 54 %
Platelets: 210 10*3/uL (ref 150–450)
RBC: 3.86 x10E6/uL (ref 3.77–5.28)
RDW: 17 % — ABNORMAL HIGH (ref 11.7–15.4)
WBC: 4.3 10*3/uL (ref 3.4–10.8)

## 2022-04-29 LAB — CMP14+EGFR
ALT: 17 IU/L (ref 0–32)
AST: 68 IU/L — ABNORMAL HIGH (ref 0–40)
Albumin/Globulin Ratio: 1 — ABNORMAL LOW (ref 1.2–2.2)
Albumin: 3.5 g/dL — ABNORMAL LOW (ref 3.8–4.9)
Alkaline Phosphatase: 112 IU/L (ref 44–121)
BUN/Creatinine Ratio: 8 — ABNORMAL LOW (ref 9–23)
BUN: 7 mg/dL (ref 6–24)
Bilirubin Total: 0.6 mg/dL (ref 0.0–1.2)
CO2: 19 mmol/L — ABNORMAL LOW (ref 20–29)
Calcium: 8.6 mg/dL — ABNORMAL LOW (ref 8.7–10.2)
Chloride: 103 mmol/L (ref 96–106)
Creatinine, Ser: 0.84 mg/dL (ref 0.57–1.00)
Globulin, Total: 3.6 g/dL (ref 1.5–4.5)
Glucose: 83 mg/dL (ref 70–99)
Potassium: 3.9 mmol/L (ref 3.5–5.2)
Sodium: 144 mmol/L (ref 134–144)
Total Protein: 7.1 g/dL (ref 6.0–8.5)
eGFR: 82 mL/min/{1.73_m2} (ref >59)

## 2022-04-29 LAB — LIPID PANEL
Chol/HDL Ratio: 1.8 ratio (ref 0.0–4.4)
Cholesterol, Total: 154 mg/dL (ref 100–199)
HDL: 87 mg/dL (ref >39)
LDL Chol Calc (NIH): 48 mg/dL (ref 0–99)
Triglycerides: 109 mg/dL (ref 0–149)
VLDL Cholesterol Cal: 19 mg/dL (ref 5–40)

## 2022-04-29 LAB — HEMOGLOBIN A1C
Est. average glucose Bld gHb Est-mCnc: 85 mg/dL
Hgb A1c MFr Bld: 4.6 % — ABNORMAL LOW (ref 4.8–5.6)

## 2022-04-29 LAB — VITAMIN B12: Vitamin B-12: 1059 pg/mL (ref 232–1245)

## 2022-04-29 LAB — FOLATE: Folate: <2 ng/mL — ABNORMAL LOW (ref >3.0)

## 2022-04-29 NOTE — Telephone Encounter (Signed)
Patient seen ED

## 2022-05-02 LAB — SPECIMEN STATUS REPORT

## 2022-05-02 LAB — VITAMIN B1: Thiamine: 84.5 nmol/L (ref 66.5–200.0)

## 2022-05-02 LAB — URIC ACID: Uric Acid: 10.9 mg/dL — ABNORMAL HIGH (ref 3.0–7.2)

## 2022-05-04 ENCOUNTER — Telehealth (INDEPENDENT_AMBULATORY_CARE_PROVIDER_SITE_OTHER): Payer: Self-pay

## 2022-05-04 NOTE — Telephone Encounter (Signed)
Contacted pt to go over lab results pt is aware and doesn't have any questions or concerns 

## 2022-05-05 ENCOUNTER — Ambulatory Visit: Payer: No Typology Code available for payment source | Admitting: Neurology

## 2022-05-05 ENCOUNTER — Ambulatory Visit: Payer: Medicaid Other | Admitting: Neurology

## 2022-05-05 ENCOUNTER — Encounter: Payer: Self-pay | Admitting: Neurology

## 2022-05-09 ENCOUNTER — Encounter (HOSPITAL_COMMUNITY): Payer: Self-pay

## 2022-05-09 ENCOUNTER — Other Ambulatory Visit: Payer: Self-pay

## 2022-05-09 ENCOUNTER — Emergency Department (HOSPITAL_COMMUNITY)
Admission: EM | Admit: 2022-05-09 | Discharge: 2022-05-09 | Disposition: A | Discharge status: Home / Self Care | Payer: BLUE CROSS/BLUE SHIELD | Attending: Emergency Medicine | Admitting: Emergency Medicine

## 2022-05-09 ENCOUNTER — Emergency Department (HOSPITAL_COMMUNITY): Payer: BLUE CROSS/BLUE SHIELD

## 2022-05-09 DIAGNOSIS — K0889 Other specified disorders of teeth and supporting structures: Secondary | ICD-10-CM | POA: Diagnosis present

## 2022-05-09 DIAGNOSIS — R1084 Generalized abdominal pain: Secondary | ICD-10-CM | POA: Insufficient documentation

## 2022-05-09 DIAGNOSIS — I10 Essential (primary) hypertension: Secondary | ICD-10-CM | POA: Diagnosis not present

## 2022-05-09 DIAGNOSIS — Z79899 Other long term (current) drug therapy: Secondary | ICD-10-CM | POA: Diagnosis not present

## 2022-05-09 DIAGNOSIS — K047 Periapical abscess without sinus: Secondary | ICD-10-CM | POA: Insufficient documentation

## 2022-05-09 DIAGNOSIS — Z7982 Long term (current) use of aspirin: Secondary | ICD-10-CM | POA: Diagnosis not present

## 2022-05-09 LAB — CBC WITH DIFFERENTIAL/PLATELET
Abs Immature Granulocytes: 0.02 10*3/uL (ref 0.00–0.07)
Basophils Absolute: 0.1 10*3/uL (ref 0.0–0.1)
Basophils Relative: 1 %
Eosinophils Absolute: 0.1 10*3/uL (ref 0.0–0.5)
Eosinophils Relative: 1 %
HCT: 33 % — ABNORMAL LOW (ref 36.0–46.0)
Hemoglobin: 11.1 g/dL — ABNORMAL LOW (ref 12.0–15.0)
Immature Granulocytes: 0 %
Lymphocytes Relative: 26 %
Lymphs Abs: 1.6 10*3/uL (ref 0.7–4.0)
MCH: 33 pg (ref 26.0–34.0)
MCHC: 33.6 g/dL (ref 30.0–36.0)
MCV: 98.2 fL (ref 80.0–100.0)
Monocytes Absolute: 0.9 10*3/uL (ref 0.1–1.0)
Monocytes Relative: 14 %
Neutro Abs: 3.6 10*3/uL (ref 1.7–7.7)
Neutrophils Relative %: 58 %
Platelets: 229 10*3/uL (ref 150–400)
RBC: 3.36 MIL/uL — ABNORMAL LOW (ref 3.87–5.11)
RDW: 18.4 % — ABNORMAL HIGH (ref 11.5–15.5)
WBC: 6.3 10*3/uL (ref 4.0–10.5)
nRBC: 0 % (ref 0.0–0.2)

## 2022-05-09 LAB — COMPREHENSIVE METABOLIC PANEL
ALT: 19 U/L (ref 0–44)
AST: 35 U/L (ref 15–41)
Albumin: 2.7 g/dL — ABNORMAL LOW (ref 3.5–5.0)
Alkaline Phosphatase: 84 U/L (ref 38–126)
Anion gap: 12 (ref 5–15)
BUN: <5 mg/dL — ABNORMAL LOW (ref 6–20)
CO2: 29 mmol/L (ref 22–32)
Calcium: 8.6 mg/dL — ABNORMAL LOW (ref 8.9–10.3)
Chloride: 96 mmol/L — ABNORMAL LOW (ref 98–111)
Creatinine, Ser: 1.24 mg/dL — ABNORMAL HIGH (ref 0.44–1.00)
GFR, Estimated: 51 mL/min — ABNORMAL LOW (ref >60)
Glucose, Bld: 101 mg/dL — ABNORMAL HIGH (ref 70–99)
Potassium: 3.9 mmol/L (ref 3.5–5.1)
Sodium: 137 mmol/L (ref 135–145)
Total Bilirubin: 0.4 mg/dL (ref 0.3–1.2)
Total Protein: 6.5 g/dL (ref 6.5–8.1)

## 2022-05-09 LAB — TYPE AND SCREEN
ABO/RH(D): B NEG
Antibody Screen: NEGATIVE

## 2022-05-09 LAB — LIPASE, BLOOD: Lipase: 86 U/L — ABNORMAL HIGH (ref 11–51)

## 2022-05-09 LAB — ABO/RH: ABO/RH(D): B NEG

## 2022-05-09 MED ORDER — LACTATED RINGERS IV BOLUS
1000.0000 mL | Freq: Once | INTRAVENOUS | Status: AC
  Administered 2022-05-09: 1000 mL via INTRAVENOUS

## 2022-05-09 MED ORDER — CLINDAMYCIN PALMITATE HCL 75 MG/5ML PO SOLR: 300.0000 mg | Freq: Three times a day (TID) | ORAL | 0 refills | Status: DC

## 2022-05-09 MED ORDER — CLINDAMYCIN PALMITATE HCL 75 MG/5ML PO SOLR: 300.0000 mg | Freq: Three times a day (TID) | ORAL | 0 refills | Status: AC

## 2022-05-09 MED ORDER — CLONIDINE HCL 0.1 MG PO TABS: ORAL_TABLET | ORAL | 1 refills | Status: DC

## 2022-05-09 MED ORDER — ONDANSETRON HCL 4 MG/2ML IJ SOLN
4.0000 mg | Freq: Once | INTRAMUSCULAR | Status: AC
  Administered 2022-05-09: 4 mg via INTRAVENOUS
  Filled 2022-05-09: qty 2

## 2022-05-09 MED ORDER — IOHEXOL 350 MG/ML SOLN
100.0000 mL | Freq: Once | INTRAVENOUS | Status: AC | PRN
  Administered 2022-05-09: 100 mL via INTRAVENOUS

## 2022-05-09 MED ORDER — MORPHINE SULFATE (PF) 4 MG/ML IV SOLN
4.0000 mg | Freq: Once | INTRAVENOUS | Status: AC
  Administered 2022-05-09: 4 mg via INTRAVENOUS
  Filled 2022-05-09: qty 1

## 2022-05-09 NOTE — Discharge Instructions (Addendum)
Your workup today showed you have a dental abscess.  Does not need to be drained at this time.  You state it has been improving recently.  I will send antibiotic solution into the pharmacy for you.  Please take this as directed.  Please call your oral surgeon to schedule a follow-up appointment.  From the abdominal pain and black stool standpoint I have given you a referral to gastroenterology.  Your hemoglobin and vital signs were stable today.  No concern for significant internal bleeding.  However if your symptoms worsen please return to the emergency department.  Otherwise follow-up with your primary care provider.

## 2022-05-09 NOTE — ED Provider Notes (Addendum)
[place] Provider Note   CSN: [id number] Arrival date & time: 05/09/22  [id number]     History  Chief Complaint  Patient presents with   Abdominal Pain   Emesis    Christina Byrd is a 57 y.o. female.  57 year old female presents today for evaluation of dental pain, difficulty swallowing, also developing abdominal pain as well as black stools.  She states her dental extractions was on February 6.  She has had 4 visits to the dentist since then.  Most recently 2 weeks ago.  She has stitches removed during this visit.  She states she was prescribed an antibiotic however this was in a capsule form and she is unable to take capsules due to prior episode of choking with capsules.  She states she was told there is no other form of antibiotic that could be prescribed.  She states since her procedure she has had decreased p.o. intake, has painful swallowing.  Abdominal pain recently started.  Also endorses black stools.  Denies any hematemesis, bright red blood per rectum.  She attributes the black stools to decreased p.o. intake and continuing to take her medications.  The history is provided by the patient. No language interpreter was used.       Home Medications Prior to Admission medications   Medication Sig Start Date End Date Taking? Authorizing Provider  allopurinol (ZYLOPRIM) 100 MG tablet Take 1 tablet (100 mg total) by mouth daily. Patient not taking: Reported on 01/25/2022 01/13/22   Kerin Perna, NP  amLODipine (NORVASC) 10 MG tablet TAKE 1 TABLET(10 MG) BY MOUTH DAILY 04/28/22   Kerin Perna, NP  aspirin EC 81 MG tablet Take 1 tablet (81 mg total) by mouth daily. 10/26/12   Rai, Ripudeep K, MD  atorvastatin (LIPITOR) 20 MG tablet TAKE 1 TABLET(20 MG) BY MOUTH DAILY 03/12/22   Kerin Perna, NP  Blood Glucose Monitoring Suppl (TRUE METRIX METER) DEVI 1 kit by Does not apply route 3 (three) times daily. CHECK BLOOD SUGAR  UP TO 3 TIMES DAILY. E11.9 02/27/19   Charlott Rakes, MD  cloNIDine (CATAPRES) 0.1 MG tablet TAKE 1 TABLET(0.1 MG) BY MOUTH AT BEDTIME AS NEEDED FOR HOT FLASHES 11/02/21   Charlott Rakes, MD  diclofenac (VOLTAREN) 75 MG EC tablet TAKE 1 TABLET(75 MG) BY MOUTH TWICE DAILY 02/01/22   Kerin Perna, NP  fenofibrate (TRICOR) 48 MG tablet Take 1 tablet (48 mg total) by mouth daily. 04/28/22   Kerin Perna, NP  fluticasone (FLONASE) 50 MCG/ACT nasal spray USE 2 SPRAYS IN EACH NOSTRIL DAILY 06/25/21   Charlott Rakes, MD  folic acid (FOLVITE) 1 MG tablet TAKE 1 TABLET(1 MG) BY MOUTH DAILY 04/29/22   Cameron Sprang, MD  glucose blood (TRUE METRIX BLOOD GLUCOSE TEST) test strip Use as instructed 03/06/20   Charlott Rakes, MD  ketoconazole (NIZORAL) 2 % cream Apply 1 Application topically daily.    [provider]  levETIRAcetam (KEPPRA) 500 MG tablet Take 1 tablet (500 mg total) by mouth 2 (two) times daily. 05/04/21   Cameron Sprang, MD  LORazepam (ATIVAN) 0.5 MG tablet take 1 tab by mouth once 30 minutes before MRI 05/06/21   Charlott Rakes, MD  magnesium oxide (MAG-OX) 400 (240 Mg) MG tablet TAKE 1 TABLET BY MOUTH EVERY DAY 06/24/21   Charlott Rakes, MD  omega-3 acid ethyl esters (LOVAZA) 1 g capsule Take 2 capsules (2 g total) by mouth 2 (two)  times daily. 10/06/21   Charlott Rakes, MD  pantoprazole (PROTONIX) 40 MG tablet TAKE 1 TABLET(40 MG) BY MOUTH DAILY 03/12/22   Kerin Perna, NP  thiamine (VITAMIN B-1) 100 MG tablet Take 1 tablet (100 mg total) by mouth daily. 07/28/20   Cameron Sprang, MD  tiZANidine (ZANAFLEX) 4 MG tablet TAKE 1 TABLET BY MOUTH EVERY 8 HOURS AS NEEDED FOR MUSCLE SPASMS 11/02/21   Charlott Rakes, MD  TRUEplus Lancets 28G MISC SMARTSIG:Topical 1 to 3 Times Daily 03/06/20   Charlott Rakes, MD  venlafaxine XR (EFFEXOR XR) 37.5 MG 24 hr capsule Take 1 capsule (37.5 mg total) by mouth daily with breakfast. For hot flashes 10/05/21   Charlott Rakes, MD  vitamin B-12  (CYANOCOBALAMIN) 1000 MCG tablet Take 1 tablet (1,000 mcg total) by mouth daily. 07/28/20   Cameron Sprang, MD  metFORMIN (GLUCOPHAGE) 500 MG tablet Take 0.5 tablets (250 mg total) by mouth daily with breakfast. 03/05/20 04/02/20  Argentina Donovan, PA-C      Allergies    Losartan potassium, Levaquin [levofloxacin in d5w], Ace inhibitors, Codeine, Cefepime, and Vancomycin    Review of Systems   Review of Systems  Constitutional:  Negative for chills and fever.  HENT:  Positive for dental problem. Negative for drooling.   Respiratory:  Negative for shortness of breath.   Cardiovascular:  Negative for chest pain.  Gastrointestinal:  Positive for abdominal pain. Negative for blood in stool, constipation, nausea and vomiting.  Neurological:  Negative for light-headedness.  All other systems reviewed and are negative.   Physical Exam Updated Vital Signs BP 100/83   Pulse 85   Temp 98 F (36.7 C) (Oral)   Resp (!) 23   Ht 5\' 2"  (1.575 m)   Wt 58.2 kg   LMP 08/23/2011   SpO2 100%   BMI 23.47 kg/m  Physical Exam Vitals and nursing note reviewed.  Constitutional:      General: She is not in acute distress.    Appearance: Normal appearance. She is not ill-appearing.  HENT:     Head: Normocephalic and atraumatic.     Nose: Nose normal.  Eyes:     General: No scleral icterus.    Extraocular Movements: Extraocular movements intact.     Conjunctiva/sclera: Conjunctivae normal.  Cardiovascular:     Rate and Rhythm: Normal rate and regular rhythm.     Pulses: Normal pulses.  Pulmonary:     Effort: Pulmonary effort is normal. No respiratory distress.     Breath sounds: Normal breath sounds. No wheezing or rales.  Abdominal:     General: There is no distension.     Palpations: Abdomen is soft.     Tenderness: There is abdominal tenderness (Mild generalized tenderness without guarding). There is no guarding.  Musculoskeletal:        General: Normal range of motion.     Cervical back:  Normal range of motion.  Skin:    General: Skin is warm and dry.  Neurological:     General: No focal deficit present.     Mental Status: She is alert. Mental status is at baseline.     ED Results / Procedures / Treatments   Labs (all labs ordered are listed, but only abnormal results are displayed) Labs Reviewed  COMPREHENSIVE METABOLIC PANEL - Abnormal; Notable for the following components:      Result Value   Chloride 96 (*)    Glucose, Bld 101 (*)    BUN <5 (*)  Creatinine, Ser 1.24 (*)    Calcium 8.6 (*)    Albumin 2.7 (*)    GFR, Estimated 51 (*)    All other components within normal limits  LIPASE, BLOOD - Abnormal; Notable for the following components:   Lipase 86 (*)    All other components within normal limits  CBC WITH DIFFERENTIAL/PLATELET - Abnormal; Notable for the following components:   RBC 3.36 (*)    Hemoglobin 11.1 (*)    HCT 33.0 (*)    RDW 18.4 (*)    All other components within normal limits  POC OCCULT BLOOD, ED  TYPE AND SCREEN  ABO/RH    EKG None  Radiology No results found.  Procedures Procedures    Medications Ordered in ED Medications  lactated ringers bolus 1,000 mL (1,000 mLs Intravenous New Bag/Given 05/09/22 1057)  ondansetron (ZOFRAN) injection 4 mg (4 mg Intravenous Given 05/09/22 1055)  morphine (PF) 4 MG/ML injection 4 mg (4 mg Intravenous Given 05/09/22 1055)    ED Course/ Medical Decision Making/ A&P                             Medical Decision Making Amount and/or Complexity of Data Reviewed Labs: ordered. Radiology: ordered.  Risk Prescription drug management.   Medical Decision Making / ED Course   This patient presents to the ED for concern of dental pain, difficulty swallowing, abdominal pain, this involves an extensive number of treatment options, and is a complaint that carries with it a high risk of complications and morbidity.  The differential diagnosis includes dental abscess, retropharyngeal  abscess, peritonsillar abscess, failure to thrive, pancreatitis, GI bleed, gastroenteritis  MDM: 57 year old female presents today for evaluation of dental pain, difficulty swallowing, as well as abdominal pain.  Also notes black stools but only after taking her medications.  She states she has a bowel movement immediately after she has any sort of p.o. intake.  Her black stools are only following medication intake.  Vital signs are stable.  Hemoglobin is stable.  Low suspicion for GI bleed.  We discussed doing a rectal exam but patient defers.  Will provide her with GI referral.  Her CT scan did not show any acute concerns.  From the standpoint of her dental pain.  Labs were obtained.  CT soft tissue neck was obtained.  CBC shows no leukocytosis.  Hemoglobin 11.1.  Which is slightly below baseline.  But not significantly.  CMP shows creatinine 1.24, otherwise electrolytes are within normal.  No acute concerns.  Lipase 86.  Not significantly elevated.  CT soft tissue neck shows right-sided periapical abscess that extends into the right nasal passage.  She was not able to take antibiotics previously prescribed by her oral surgeon.  She is status post oral surgery about 1-1/2 months out.  Will discuss with ENT regarding the extension into the nasal passage.  Discussed with ENT.  No acute concerns from the periapical abscess extending into the right nasal passage.  Will start patient on clindamycin and have her follow-up with her oral surgeon.   Patient is appropriate for discharge.  Discharged in stable condition.  Return precaution discussed.   O2 sats noted to be in the mid to low 80s.  Please do not aggravate.  Patient maintained mid 90s on room air during my evaluations.  She denies any complaint of shortness of breath.   Lab Tests: -I ordered, reviewed, and interpreted labs.   The pertinent  results include:   Labs Reviewed  COMPREHENSIVE METABOLIC PANEL - Abnormal; Notable for the following  components:      Result Value   Chloride 96 (*)    Glucose, Bld 101 (*)    BUN <5 (*)    Creatinine, Ser 1.24 (*)    Calcium 8.6 (*)    Albumin 2.7 (*)    GFR, Estimated 51 (*)    All other components within normal limits  LIPASE, BLOOD - Abnormal; Notable for the following components:   Lipase 86 (*)    All other components within normal limits  CBC WITH DIFFERENTIAL/PLATELET - Abnormal; Notable for the following components:   RBC 3.36 (*)    Hemoglobin 11.1 (*)    HCT 33.0 (*)    RDW 18.4 (*)    All other components within normal limits  POC OCCULT BLOOD, ED  TYPE AND SCREEN  ABO/RH      EKG  EKG Interpretation  Date/Time:    Ventricular Rate:    PR Interval:    QRS Duration:   QT Interval:    QTC Calculation:   R Axis:     Text Interpretation:           Imaging Studies ordered: I ordered imaging studies including CT soft tissue neck, CT abdomen pelvis with contrast I independently visualized and interpreted imaging. I agree with the radiologist interpretation   Medicines ordered and prescription drug management: Meds ordered this encounter  Medications   lactated ringers bolus 1,000 mL   ondansetron (ZOFRAN) injection 4 mg   morphine (PF) 4 MG/ML injection 4 mg   iohexol (OMNIPAQUE) 350 MG/ML injection 100 mL    -I have reviewed the patients home medicines and have made adjustments as needed   Consultations Obtained: I requested consultation with the ENT (Dr. Janace Hoard),  and discussed lab and imaging findings as well as pertinent plan - they recommend: As above   Cardiac Monitoring: The patient was maintained on a cardiac monitor.  I personally viewed and interpreted the cardiac monitored which showed an underlying rhythm of: normal sinus rhythm  Reevaluation: After the interventions noted above, I reevaluated the patient and found that they have :improved  Co morbidities that complicate the patient evaluation  Past Medical History:  Diagnosis  Date   GASTROESOPHAGEAL REFLUX, NO ESOPHAGITIS 04/21/2006   Qualifier: Diagnosis of  By: Benna Dunks     GERD (gastroesophageal reflux disease) Dx 1995   HTN (hypertension)       Dispostion: Patient is appropriate for discharge.  Discharged in stable condition.  Return precaution discussed.  Patient voices understanding and is in agreement with plan.  Patient evaluated by attending as well.  Final Clinical Impression(s) / ED Diagnoses Final diagnoses:  Generalized abdominal pain  Periapical abscess    Rx / DC Orders ED Discharge Orders          Ordered    clindamycin (CLEOCIN) 75 MG/5ML solution  3 times daily        05/09/22 1351              Evlyn Courier, PA-C 05/09/22 1355    Evlyn Courier, PA-C 05/09/22 [place], [place], DO 05/09/22 1403

## 2022-05-09 NOTE — ED Triage Notes (Signed)
Pt states she had all her teeth pulled 2/6 and the abdominal pain and emesis. Pt states she has been going to dentist about it and he gave her an atx, but she couldn't take it, because it was a capsule and she takes she can't take capsules and she told him, but he wouldn't change it. Pt has only been eating very little due to N/Vx1.5 mos. Pt states she noticed black stools the last couple of days.

## 2022-05-09 NOTE — Progress Notes (Signed)
Christina Byrd, is a 57 y.o. female  [id number]  [id number]  DOB - 04-12-1965  Chief Complaint  Patient presents with   Hypertension       Subjective:   Christina Byrd is a 57 y.o. female here today for a follow up visit hypertension.  BP is unremarkable.  Patient has No headache, No chest pain, No abdominal pain - No Nausea, No new weakness tingling or numbness, No Cough - shortness of breath.  Patient is actually in for inability to chew, intake liquids, has a different taste in her mouth, recently had bottom teeth removed stitches are still in place although the biodegradable.  She has seen the dentist since writer called him and discussed the problems.  Patient states the dentist saw her and said there is nothing else he can do.  Discouraged the use of the emergency room unlikely to remove stitches and mouth needs to return to the dentist that removed the teeth for proper care.  No problems updated.  Allergies  Allergen Reactions   Losartan Potassium     Rash   Levaquin [Levofloxacin In D5w] Rash   Ace Inhibitors Cough    REACTION: cough   Codeine Nausea Only   Cefepime Rash    09/04/12 pm Patient started to break out with small macules after IV Vanco infusion, then macules increased in size after starting cefepime infusion.   Vancomycin Rash    09/04/12 pm Patient started to break out with small macules after IV Vanco infusion, then macules increased in size after starting cefepime infusion.    Past Medical History:  Diagnosis Date   GASTROESOPHAGEAL REFLUX, NO ESOPHAGITIS 04/21/2006   Qualifier: Diagnosis of  By: Benna Dunks     GERD (gastroesophageal reflux disease) Dx 1995   HTN (hypertension)     Current Outpatient Medications on File Prior to Visit  Medication Sig Dispense Refill   allopurinol (ZYLOPRIM) 100 MG tablet Take 1 tablet (100 mg total) by mouth daily. (Patient not taking: Reported on 01/25/2022) 30 tablet 6    aspirin EC 81 MG tablet Take 1 tablet (81 mg total) by mouth daily.     atorvastatin (LIPITOR) 20 MG tablet TAKE 1 TABLET(20 MG) BY MOUTH DAILY 90 tablet 1   Blood Glucose Monitoring Suppl (TRUE METRIX METER) DEVI 1 kit by Does not apply route 3 (three) times daily. CHECK BLOOD SUGAR UP TO 3 TIMES DAILY. E11.9 1 each 0   cloNIDine (CATAPRES) 0.1 MG tablet TAKE 1 TABLET(0.1 MG) BY MOUTH AT BEDTIME AS NEEDED FOR HOT FLASHES 90 tablet 1   diclofenac (VOLTAREN) 75 MG EC tablet TAKE 1 TABLET(75 MG) BY MOUTH TWICE DAILY 180 tablet 1   fluticasone (FLONASE) 50 MCG/ACT nasal spray USE 2 SPRAYS IN EACH NOSTRIL DAILY 16 g 1   glucose blood (TRUE METRIX BLOOD GLUCOSE TEST) test strip Use as instructed 100 each 12   ketoconazole (NIZORAL) 2 % cream Apply 1 Application topically daily.     levETIRAcetam (KEPPRA) 500 MG tablet Take 1 tablet (500 mg total) by mouth 2 (two) times daily. 180 tablet 3   LORazepam (ATIVAN) 0.5 MG tablet take 1 tab by mouth once 30 minutes before MRI 1 tablet 0   magnesium oxide (MAG-OX) 400 (240 Mg) MG tablet TAKE 1 TABLET BY MOUTH EVERY DAY 90 tablet 0   omega-3 acid ethyl esters (LOVAZA) 1 g capsule Take 2 capsules (2 g total) by mouth 2 (two) times daily. 60 capsule 6  pantoprazole (PROTONIX) 40 MG tablet TAKE 1 TABLET(40 MG) BY MOUTH DAILY 90 tablet 1   thiamine (VITAMIN B-1) 100 MG tablet Take 1 tablet (100 mg total) by mouth daily. 30 tablet 11   tiZANidine (ZANAFLEX) 4 MG tablet TAKE 1 TABLET BY MOUTH EVERY 8 HOURS AS NEEDED FOR MUSCLE SPASMS 120 tablet 1   TRUEplus Lancets 28G MISC SMARTSIG:Topical 1 to 3 Times Daily 100 each 3   venlafaxine XR (EFFEXOR XR) 37.5 MG 24 hr capsule Take 1 capsule (37.5 mg total) by mouth daily with breakfast. For hot flashes 90 capsule 1   vitamin B-12 (CYANOCOBALAMIN) 1000 MCG tablet Take 1 tablet (1,000 mcg total) by mouth daily. 30 tablet 11   [DISCONTINUED] metFORMIN (GLUCOPHAGE) 500 MG tablet Take 0.5 tablets (250 mg total) by mouth daily  with breakfast. 45 tablet 6   No current facility-administered medications on file prior to visit.    Objective:   Vitals:   04/27/22 1108  BP: 124/74  Pulse: (Abnormal) 102  Resp: 16  SpO2: 99%  Weight: 128 lb 6.4 oz (58.2 kg)  Height: 5\' 2"  (1.575 m)    Comprehensive ROS Pertinent positive and negative noted in HPI   Exam General appearance : Awake, alert, not in any distress. Speech Clear. Not toxic looking HEENT: Atraumatic and Normocephalic, pupils equally reactive to light and accomodation Neck: Supple, no JVD. No cervical lymphadenopathy.  Chest: Good air entry bilaterally, no added sounds  CVS: S1 S2 regular, no murmurs.  Abdomen: Bowel sounds present, Non tender and not distended with no gaurding, rigidity or rebound. Extremities: B/L Lower Ext shows no edema, both legs are warm to touch Neurology: Awake alert, and oriented X 3, CN II-XII intact, Non focal Skin: No Rash  Data Review Lab Results  Component Value Date   HGBA1C 4.6 (L) 04/27/2022   HGBA1C 5.4 10/05/2021   HGBA1C 5.5 03/24/2021    Assessment & Plan   Chanti was seen today for hypertension.  Diagnoses and all orders for this visit:  Pain, dental -     CBC with Differential  Essential hypertension, benign -     CMP14+EGFR  Mixed hyperlipidemia -     Lipid Panel  Chronic gout of left knee, unspecified cause -     Cancel: Uric acid -     Uric acid  Vitamin B deficiency -     Vitamin B12 -     Folate -     Cancel: Vitamin B1 -     Vitamin B1  Type 2 diabetes mellitus without complication, without long-term current use of insulin (HCC) -     Hemoglobin A1c  Vasomotor symptoms due to menopause  Other orders -     Specimen status report -     Specimen status report     Patient have been counseled extensively about nutrition and exercise. Other issues discussed during this visit include: low cholesterol diet, weight control and daily exercise, foot care, annual eye  examinations at Ophthalmology, importance of adherence with medications and regular follow-up. We also discussed long term complications of uncontrolled diabetes and hypertension.   No follow-ups on file.  The patient was given clear instructions to go to ER or return to medical center if symptoms don't improve, worsen or new problems develop. The patient verbalized understanding. The patient was told to call to get lab results if they haven't heard anything in the next week.   This note has been created with Museum/gallery curator  and smart Company secretary. Any transcriptional errors are unintentional.   Kerin Perna, NP 05/09/2022, 5:57 PM

## 2022-05-09 NOTE — ED Notes (Signed)
Pt's Sa02 dropped while she was sleeping down to 82%. I placed pt on 2L 02 per [place].

## 2022-05-10 ENCOUNTER — Encounter: Payer: Self-pay | Admitting: Internal Medicine

## 2022-05-10 ENCOUNTER — Telehealth: Payer: Self-pay | Admitting: Emergency Medicine

## 2022-05-10 NOTE — Telephone Encounter (Signed)
Copied from [place] [id number]. Topic: Appointment Scheduling - Scheduling Inquiry for Clinic >> May 10, 2022 10:36 AM Marcellus Scott wrote: Reason for CRM:Pt stated she just left the hospital, and the dentist left her with a bad infection. Is requesting a callback if possible from PCP.  Pt is requesting a hospital follow-up appointment; no appointments are available within the next 7-14 days. Pt was discharged from [facility] 03/17.  Please advise.

## 2022-05-11 NOTE — Telephone Encounter (Signed)
Patient given hospital  f/u appointment 05/20/2022

## 2022-05-20 ENCOUNTER — Ambulatory Visit (INDEPENDENT_AMBULATORY_CARE_PROVIDER_SITE_OTHER): Payer: BLUE CROSS/BLUE SHIELD | Admitting: Primary Care

## 2022-05-21 ENCOUNTER — Encounter (INDEPENDENT_AMBULATORY_CARE_PROVIDER_SITE_OTHER): Payer: Self-pay | Admitting: Primary Care

## 2022-05-21 ENCOUNTER — Telehealth (INDEPENDENT_AMBULATORY_CARE_PROVIDER_SITE_OTHER): Payer: Self-pay | Admitting: Primary Care

## 2022-05-21 ENCOUNTER — Ambulatory Visit (INDEPENDENT_AMBULATORY_CARE_PROVIDER_SITE_OTHER): Payer: BLUE CROSS/BLUE SHIELD | Admitting: Primary Care

## 2022-05-21 VITALS — BP 108/74 | HR 104 | Resp 16 | Wt 125.0 lb

## 2022-05-21 DIAGNOSIS — K029 Dental caries, unspecified: Secondary | ICD-10-CM | POA: Diagnosis not present

## 2022-05-21 DIAGNOSIS — I10 Essential (primary) hypertension: Secondary | ICD-10-CM | POA: Diagnosis not present

## 2022-05-21 NOTE — Telephone Encounter (Signed)
Will forward to provider  

## 2022-05-21 NOTE — Progress Notes (Unsigned)
[facility]   Subjective:   Christina Byrd is a 57 y.o. female presents for hospital follow up. Admit date to the hospital was 05/09/22, patient was discharged from the hospital on 05/09/22, patient was admitted for:  Dental pain refer to another dentist displeased with previous one had to return several times with no tx. Patient had to go to the ED to get oral clindamycin to treat infection in her gums. Patient and Probation officer spoke to dentist about a different abt's and needed to be oral. To her dis may per patient dentist told her it was nothing else he could do. Past Medical History:  Diagnosis Date   GASTROESOPHAGEAL REFLUX, NO ESOPHAGITIS 04/21/2006   Qualifier: Diagnosis of  By: Benna Dunks     GERD (gastroesophageal reflux disease) Dx 1995   HTN (hypertension)      Allergies  Allergen Reactions   Losartan Potassium     Rash   Levaquin [Levofloxacin In D5w] Rash   Ace Inhibitors Cough    REACTION: cough   Codeine Nausea Only   Cefepime Rash    09/04/12 pm Patient started to break out with small macules after IV Vanco infusion, then macules increased in size after starting cefepime infusion.   Vancomycin Rash    09/04/12 pm Patient started to break out with small macules after IV Vanco infusion, then macules increased in size after starting cefepime infusion.      Current Outpatient Medications on File Prior to Visit  Medication Sig Dispense Refill   allopurinol (ZYLOPRIM) 100 MG tablet Take 1 tablet (100 mg total) by mouth daily. (Patient not taking: Reported on 01/25/2022) 30 tablet 6   amLODipine (NORVASC) 10 MG tablet TAKE 1 TABLET(10 MG) BY MOUTH DAILY 90 tablet 1   aspirin EC 81 MG tablet Take 1 tablet (81 mg total) by mouth daily.     atorvastatin (LIPITOR) 20 MG tablet TAKE 1 TABLET(20 MG) BY MOUTH DAILY 90 tablet 1   Blood Glucose Monitoring Suppl (TRUE METRIX METER) DEVI 1 kit by Does not apply route 3 (three) times daily. CHECK BLOOD SUGAR UP TO 3 TIMES  DAILY. E11.9 1 each 0   cloNIDine (CATAPRES) 0.1 MG tablet : TAKE 1 TABLET(0.1 MG) BY MOUTH AT BEDTIME AS NEEDED FOR HOT FLASHES 90 tablet 1   diclofenac (VOLTAREN) 75 MG EC tablet TAKE 1 TABLET(75 MG) BY MOUTH TWICE DAILY 180 tablet 1   fenofibrate (TRICOR) 48 MG tablet Take 1 tablet (48 mg total) by mouth daily. 90 tablet 3   fluticasone (FLONASE) 50 MCG/ACT nasal spray USE 2 SPRAYS IN EACH NOSTRIL DAILY 16 g 1   folic acid (FOLVITE) 1 MG tablet TAKE 1 TABLET(1 MG) BY MOUTH DAILY 30 tablet 0   glucose blood (TRUE METRIX BLOOD GLUCOSE TEST) test strip Use as instructed 100 each 12   ketoconazole (NIZORAL) 2 % cream Apply 1 Application topically daily.     levETIRAcetam (KEPPRA) 500 MG tablet Take 1 tablet (500 mg total) by mouth 2 (two) times daily. 180 tablet 3   LORazepam (ATIVAN) 0.5 MG tablet take 1 tab by mouth once 30 minutes before MRI 1 tablet 0   magnesium oxide (MAG-OX) 400 (240 Mg) MG tablet TAKE 1 TABLET BY MOUTH EVERY DAY 90 tablet 0   omega-3 acid ethyl esters (LOVAZA) 1 g capsule Take 2 capsules (2 g total) by mouth 2 (two) times daily. 60 capsule 6   pantoprazole (PROTONIX) 40 MG tablet TAKE 1 TABLET(40 MG) BY  MOUTH DAILY 90 tablet 1   thiamine (VITAMIN B-1) 100 MG tablet Take 1 tablet (100 mg total) by mouth daily. 30 tablet 11   tiZANidine (ZANAFLEX) 4 MG tablet TAKE 1 TABLET BY MOUTH EVERY 8 HOURS AS NEEDED FOR MUSCLE SPASMS 120 tablet 1   TRUEplus Lancets 28G MISC SMARTSIG:Topical 1 to 3 Times Daily 100 each 3   venlafaxine XR (EFFEXOR XR) 37.5 MG 24 hr capsule Take 1 capsule (37.5 mg total) by mouth daily with breakfast. For hot flashes 90 capsule 1   vitamin B-12 (CYANOCOBALAMIN) 1000 MCG tablet Take 1 tablet (1,000 mcg total) by mouth daily. 30 tablet 11   [DISCONTINUED] metFORMIN (GLUCOPHAGE) 500 MG tablet Take 0.5 tablets (250 mg total) by mouth daily with breakfast. 45 tablet 6   No current facility-administered medications on file prior to visit.     Review of  System: Comprehensive ROS Pertinent positive and negative noted in HPI    Objective:  Blood Pressure 108/74   Pulse (Abnormal) 104   Respiration 16   Weight 125 lb (56.7 kg)   Last Menstrual Period 08/23/2011   Oxygen Saturation 98%   Body Mass Index 22.86 kg/m   Filed Weights   05/21/22 0936  Weight: 125 lb (56.7 kg)    Physical Exam: General Appearance: Well nourished, in no apparent distress. Eyes: PERRLA, EOMs, conjunctiva no swelling or erythema Sinuses: No Frontal/maxillary tenderness ENT/Mouth: Ext aud canals clear, TMs without erythema, bulging. Gums hurt teeth removal .Hearing normal.  Neck: Supple, thyroid normal.  Respiratory: Respiratory effort normal, BS equal bilaterally without rales, rhonchi, wheezing or stridor.  Cardio: RRR with no MRGs. Brisk peripheral pulses without edema.  Abdomen: Soft, + BS.  Non tender, no guarding, rebound, hernias, masses. Lymphatics: Non tender without lymphadenopathy.  Musculoskeletal: Full ROM, 5/5 strength, normal gait.  Skin: Warm, dry without rashes, lesions, ecchymosis.  Neuro: Cranial nerves intact. Normal muscle tone, no cerebellar symptoms. Sensation intact.  Psych: Awake and oriented X 3, normal affect, Insight and Judgment appropriate.    Assessment:  Christina Byrd was seen today for hospitalization follow-up.  Diagnoses and all orders for this visit:  Dental caries -     Ambulatory referral to Dentistry  HYPERTENSION, BENIGN ESSENTIAL BP goal - < 130/80 goal met Explained that having normal blood pressure is the goal and medications are helping to get to goal and maintain normal blood pressure. DIET: Limit salt intake, read nutrition labels to check salt content, limit fried and high fatty foods  Avoid using multisymptom OTC cold preparations that generally contain sudafed which can rise BP. Consult with pharmacist on best cold relief products to use for persons with HTN EXERCISE Discussed incorporating exercise such  as walking - 30 minutes most days of the week and can do in 10 minute intervals    -     amLODipine (NORVASC) 10 MG tablet; TAKE 1 TABLET(10 MG) BY MOUTH DAILY    No orders of the defined types were placed in this encounter.   This note has been created with Surveyor, quantity. Any transcriptional errors are unintentional.   Kerin Perna, NP 05/21/2022, 10:20 AM

## 2022-05-21 NOTE — Telephone Encounter (Signed)
Copied from [place] [id number]. Topic: General - Other >> May 21, 2022 10:33 AM Oley Balm A wrote: Reason for CRM: Pt states that she just left the office and Sharyn Lull forgot to give her a phone number. Please have PCP contact pt.

## 2022-05-22 MED ORDER — AMLODIPINE BESYLATE 10 MG PO TABS: ORAL_TABLET | ORAL | 1 refills | Status: DC

## 2022-05-29 ENCOUNTER — Other Ambulatory Visit: Payer: Self-pay

## 2022-05-29 ENCOUNTER — Emergency Department (HOSPITAL_COMMUNITY): Payer: BLUE CROSS/BLUE SHIELD

## 2022-05-29 ENCOUNTER — Emergency Department (HOSPITAL_COMMUNITY)
Admission: EM | Admit: 2022-05-29 | Discharge: 2022-05-29 | Disposition: A | Discharge status: Home / Self Care | Payer: BLUE CROSS/BLUE SHIELD | Attending: Emergency Medicine | Admitting: Emergency Medicine

## 2022-05-29 DIAGNOSIS — R112 Nausea with vomiting, unspecified: Secondary | ICD-10-CM | POA: Insufficient documentation

## 2022-05-29 DIAGNOSIS — R109 Unspecified abdominal pain: Secondary | ICD-10-CM | POA: Diagnosis not present

## 2022-05-29 DIAGNOSIS — Z7982 Long term (current) use of aspirin: Secondary | ICD-10-CM | POA: Insufficient documentation

## 2022-05-29 LAB — COMPREHENSIVE METABOLIC PANEL
ALT: 28 U/L (ref 0–44)
AST: 80 U/L — ABNORMAL HIGH (ref 15–41)
Albumin: 2.9 g/dL — ABNORMAL LOW (ref 3.5–5.0)
Alkaline Phosphatase: 82 U/L (ref 38–126)
Anion gap: 20 — ABNORMAL HIGH (ref 5–15)
BUN: 9 mg/dL (ref 6–20)
CO2: 24 mmol/L (ref 22–32)
Calcium: 8.3 mg/dL — ABNORMAL LOW (ref 8.9–10.3)
Chloride: 93 mmol/L — ABNORMAL LOW (ref 98–111)
Creatinine, Ser: 1.23 mg/dL — ABNORMAL HIGH (ref 0.44–1.00)
GFR, Estimated: 52 mL/min — ABNORMAL LOW (ref >60)
Glucose, Bld: 98 mg/dL (ref 70–99)
Potassium: 3.3 mmol/L — ABNORMAL LOW (ref 3.5–5.1)
Sodium: 137 mmol/L (ref 135–145)
Total Bilirubin: 1.3 mg/dL — ABNORMAL HIGH (ref 0.3–1.2)
Total Protein: 6.8 g/dL (ref 6.5–8.1)

## 2022-05-29 LAB — URINALYSIS, ROUTINE W REFLEX MICROSCOPIC
Bilirubin Urine: NEGATIVE
Glucose, UA: NEGATIVE mg/dL
Hgb urine dipstick: NEGATIVE
Ketones, ur: 5 mg/dL — AB
Nitrite: NEGATIVE
Protein, ur: 100 mg/dL — AB
Specific Gravity, Urine: 1.016 (ref 1.005–1.030)
pH: 5 (ref 5.0–8.0)

## 2022-05-29 LAB — CBC
HCT: 34.3 % — ABNORMAL LOW (ref 36.0–46.0)
Hemoglobin: 11.7 g/dL — ABNORMAL LOW (ref 12.0–15.0)
MCH: 32.1 pg (ref 26.0–34.0)
MCHC: 34.1 g/dL (ref 30.0–36.0)
MCV: 94 fL (ref 80.0–100.0)
Platelets: 177 10*3/uL (ref 150–400)
RBC: 3.65 MIL/uL — ABNORMAL LOW (ref 3.87–5.11)
RDW: 17.4 % — ABNORMAL HIGH (ref 11.5–15.5)
WBC: 7.3 10*3/uL (ref 4.0–10.5)
nRBC: 0 % (ref 0.0–0.2)

## 2022-05-29 LAB — TROPONIN I (HIGH SENSITIVITY): Troponin I (High Sensitivity): 9 ng/L (ref <18)

## 2022-05-29 LAB — LIPASE, BLOOD: Lipase: 101 U/L — ABNORMAL HIGH (ref 11–51)

## 2022-05-29 MED ORDER — ONDANSETRON 4 MG PO TBDP: 4.0000 mg | ORAL_TABLET | Freq: Three times a day (TID) | ORAL | 0 refills | Status: DC | PRN

## 2022-05-29 MED ORDER — LACTATED RINGERS IV BOLUS: 1000.0000 mL | Freq: Once | INTRAVENOUS | Status: DC

## 2022-05-29 MED ORDER — IOHEXOL 350 MG/ML SOLN
75.0000 mL | Freq: Once | INTRAVENOUS | Status: AC | PRN
  Administered 2022-05-29: 75 mL via INTRAVENOUS

## 2022-05-29 MED ORDER — ONDANSETRON 4 MG PO TBDP
4.0000 mg | ORAL_TABLET | Freq: Once | ORAL | Status: AC | PRN
  Administered 2022-05-29: 4 mg via ORAL
  Filled 2022-05-29: qty 1

## 2022-05-29 MED ORDER — ONDANSETRON HCL 4 MG/2ML IJ SOLN
4.0000 mg | Freq: Once | INTRAMUSCULAR | Status: AC
  Administered 2022-05-29: 4 mg via INTRAVENOUS
  Filled 2022-05-29: qty 2

## 2022-05-29 MED ORDER — LACTATED RINGERS IV BOLUS
1000.0000 mL | Freq: Once | INTRAVENOUS | Status: AC
  Administered 2022-05-29: 1000 mL via INTRAVENOUS

## 2022-05-29 MED ORDER — PANTOPRAZOLE SODIUM 40 MG IV SOLR
40.0000 mg | Freq: Once | INTRAVENOUS | Status: AC
  Administered 2022-05-29: 40 mg via INTRAVENOUS
  Filled 2022-05-29: qty 10

## 2022-05-29 NOTE — Discharge Instructions (Signed)
CT scan shows improvement in the fluid collection where your teeth were removed.  No further antibiotics needed.  Take the nausea medication as prescribed.  Start with a clear liquid diet and advance slowly as tolerated.  Decrease your alcohol use.  Return to the ED with worsening symptoms including intractable vomiting, abdominal pain, difficulty breathing, difficulty swallowing, chest pain or other concerns.  Follow-up with your dentist and oral surgeon for

## 2022-05-29 NOTE — ED Provider Notes (Signed)
[place] EMERGENCY DEPARTMENT AT [facility] Provider Note   CSN: [id number] Arrival date & time: 05/29/22  0022     History  Chief Complaint  Patient presents with   Emesis    Christina Byrd is a 57 y.o. female.  Patient presents with "not better since last time I was here".  She states she has had ongoing nausea vomiting abdominal pain since she was seen in the ED on March 17.  She believes this is related to her dental problems.  She had most of her teeth extracted by her dentist at the beginning of March.  She developed complications from this with retained sutures and had to have 3 or 4 different dental visits before these symptoms have resolved.  She was given a course of clindamycin when she was seen in the ED on March 17 and feels that she is no better.  Still having vomiting and spitting with attempted eating.  Every time she tries to eat she says there is pain in her throat and she has spitting and difficulty swallowing due to pain in her mouth and throat.  There has been no bleeding or drainage.  No difficulty breathing.  No known fever.  No chest pain but has burning in her throat from vomiting.  States she did not have any abdominal pain issues prior to her dental issues.  Has not seen her dentist since she was last here in March 17.  Having diffuse crampy abdominal pain.  Sounds like mostly she is having spitting rather than true vomiting.  Denies diarrhea or fever.  No pain with urination or blood in the urine. Drinks alcohol on a regular basis, multiple beers per day.  No history of withdrawal symptoms  The history is provided by the patient.  Emesis Associated symptoms: abdominal pain   Associated symptoms: no arthralgias, no chills, no cough, no diarrhea, no fever, no headaches and no myalgias        Home Medications Prior to Admission medications   Medication Sig Start Date End Date Taking? Authorizing Provider  allopurinol (ZYLOPRIM) 100 MG tablet  Take 1 tablet (100 mg total) by mouth daily. Patient not taking: Reported on 01/25/2022 01/13/22   Grayce Sessions, NP  amLODipine (NORVASC) 10 MG tablet TAKE 1 TABLET(10 MG) BY MOUTH DAILY 05/22/22   Grayce Sessions, NP  aspirin EC 81 MG tablet Take 1 tablet (81 mg total) by mouth daily. 10/26/12   Rai, Ripudeep K, MD  atorvastatin (LIPITOR) 20 MG tablet TAKE 1 TABLET(20 MG) BY MOUTH DAILY 03/12/22   Grayce Sessions, NP  Blood Glucose Monitoring Suppl (TRUE METRIX METER) DEVI 1 kit by Does not apply route 3 (three) times daily. CHECK BLOOD SUGAR UP TO 3 TIMES DAILY. E11.9 02/27/19   Hoy Register, MD  cloNIDine (CATAPRES) 0.1 MG tablet : TAKE 1 TABLET(0.1 MG) BY MOUTH AT BEDTIME AS NEEDED FOR HOT FLASHES 05/09/22   Grayce Sessions, NP  diclofenac (VOLTAREN) 75 MG EC tablet TAKE 1 TABLET(75 MG) BY MOUTH TWICE DAILY 02/01/22   Grayce Sessions, NP  fenofibrate (TRICOR) 48 MG tablet Take 1 tablet (48 mg total) by mouth daily. 04/28/22   Grayce Sessions, NP  fluticasone (FLONASE) 50 MCG/ACT nasal spray USE 2 SPRAYS IN EACH NOSTRIL DAILY 06/25/21   Hoy Register, MD  folic acid (FOLVITE) 1 MG tablet TAKE 1 TABLET(1 MG) BY MOUTH DAILY 04/29/22   Van Clines, MD  glucose blood (TRUE METRIX  BLOOD GLUCOSE TEST) test strip Use as instructed 03/06/20   Hoy Register, MD  ketoconazole (NIZORAL) 2 % cream Apply 1 Application topically daily.    [provider]  levETIRAcetam (KEPPRA) 500 MG tablet Take 1 tablet (500 mg total) by mouth 2 (two) times daily. 05/04/21   Van Clines, MD  LORazepam (ATIVAN) 0.5 MG tablet take 1 tab by mouth once 30 minutes before MRI 05/06/21   Hoy Register, MD  magnesium oxide (MAG-OX) 400 (240 Mg) MG tablet TAKE 1 TABLET BY MOUTH EVERY DAY 06/24/21   Hoy Register, MD  omega-3 acid ethyl esters (LOVAZA) 1 g capsule Take 2 capsules (2 g total) by mouth 2 (two) times daily. 10/06/21   Hoy Register, MD  pantoprazole (PROTONIX) 40 MG tablet TAKE 1  TABLET(40 MG) BY MOUTH DAILY 03/12/22   Grayce Sessions, NP  thiamine (VITAMIN B-1) 100 MG tablet Take 1 tablet (100 mg total) by mouth daily. 07/28/20   Van Clines, MD  tiZANidine (ZANAFLEX) 4 MG tablet TAKE 1 TABLET BY MOUTH EVERY 8 HOURS AS NEEDED FOR MUSCLE SPASMS 11/02/21   Hoy Register, MD  TRUEplus Lancets 28G MISC SMARTSIG:Topical 1 to 3 Times Daily 03/06/20   Hoy Register, MD  venlafaxine XR (EFFEXOR XR) 37.5 MG 24 hr capsule Take 1 capsule (37.5 mg total) by mouth daily with breakfast. For hot flashes 10/05/21   Hoy Register, MD  vitamin B-12 (CYANOCOBALAMIN) 1000 MCG tablet Take 1 tablet (1,000 mcg total) by mouth daily. 07/28/20   Van Clines, MD  metFORMIN (GLUCOPHAGE) 500 MG tablet Take 0.5 tablets (250 mg total) by mouth daily with breakfast. 03/05/20 04/02/20  Anders Simmonds, PA-C      Allergies    Losartan potassium, Levaquin [levofloxacin in d5w], Ace inhibitors, Codeine, Cefepime, and Vancomycin    Review of Systems   Review of Systems  Constitutional:  Negative for chills and fever.  HENT:  Positive for dental problem, mouth sores and trouble swallowing.   Respiratory:  Negative for cough and shortness of breath.   Gastrointestinal:  Positive for abdominal pain, nausea and vomiting. Negative for diarrhea.  Genitourinary:  Negative for dysuria and hematuria.  Musculoskeletal:  Negative for arthralgias and myalgias.  Skin:  Negative for rash.  Neurological:  Positive for weakness. Negative for dizziness and headaches.   all other systems are negative except as noted in the HPI and PMH.    Physical Exam Updated Vital Signs BP 94/81 (BP Location: Left Arm)   Pulse (!) 120   Temp 97.8 F (36.6 C)   Resp 16   LMP 08/23/2011   SpO2 98%  Physical Exam Vitals and nursing note reviewed.  Constitutional:      General: She is not in acute distress.    Appearance: She is well-developed.  HENT:     Head: Normocephalic and atraumatic.     Mouth/Throat:      Pharynx: No oropharyngeal exudate.     Comments: Edentulous, controlling secretions, no tongue or lip swelling.  No obvious abscess.  Uvula is midline. Eyes:     Conjunctiva/sclera: Conjunctivae normal.     Pupils: Pupils are equal, round, and reactive to light.  Neck:     Comments: No meningismus. Cardiovascular:     Rate and Rhythm: Normal rate and regular rhythm.     Heart sounds: Normal heart sounds. No murmur heard. Pulmonary:     Effort: Pulmonary effort is normal. No respiratory distress.     Breath  sounds: Normal breath sounds.  Chest:     Chest wall: No tenderness.  Abdominal:     Palpations: Abdomen is soft.     Tenderness: There is abdominal tenderness. There is no guarding or rebound.     Comments: Diffuse crampy tenderness, no guarding or rebound  Musculoskeletal:        General: No tenderness. Normal range of motion.     Cervical back: Normal range of motion and neck supple.  Skin:    General: Skin is warm.  Neurological:     Mental Status: She is alert and oriented to person, place, and time.     Cranial Nerves: No cranial nerve deficit.     Motor: No abnormal muscle tone.     Coordination: Coordination normal.     Comments: No ataxia on finger to nose bilaterally. No pronator drift. 5/5 strength throughout. CN 2-12 intact.Equal grip strength. Sensation intact.   Psychiatric:        Behavior: Behavior normal.     ED Results / Procedures / Treatments   Labs (all labs ordered are listed, but only abnormal results are displayed) Labs Reviewed  LIPASE, BLOOD - Abnormal; Notable for the following components:      Result Value   Lipase 101 (*)    All other components within normal limits  COMPREHENSIVE METABOLIC PANEL - Abnormal; Notable for the following components:   Potassium 3.3 (*)    Chloride 93 (*)    Creatinine, Ser 1.23 (*)    Calcium 8.3 (*)    Albumin 2.9 (*)    AST 80 (*)    Total Bilirubin 1.3 (*)    GFR, Estimated 52 (*)    Anion gap 20 (*)     All other components within normal limits  CBC - Abnormal; Notable for the following components:   RBC 3.65 (*)    Hemoglobin 11.7 (*)    HCT 34.3 (*)    RDW 17.4 (*)    All other components within normal limits  URINALYSIS, ROUTINE W REFLEX MICROSCOPIC - Abnormal; Notable for the following components:   APPearance HAZY (*)    Ketones, ur 5 (*)    Protein, ur 100 (*)    Leukocytes,Ua LARGE (*)    Bacteria, UA RARE (*)    Non Squamous Epithelial 0-5 (*)    All other components within normal limits  TROPONIN I (HIGH SENSITIVITY)  TROPONIN I (HIGH SENSITIVITY)    EKG EKG Interpretation  Date/Time:  Saturday May 29 2022 05:24:16 EDT Ventricular Rate:  98 PR Interval:  159 QRS Duration: 91 QT Interval:  375 QTC Calculation: 479 R Axis:   69 Text Interpretation: Sinus rhythm Anteroseptal infarct, age indeterminate No significant change was found Confirmed by Glynn Octaveancour, Jull Harral [phone]([id number]) on 05/29/2022 5:30:03 AM  Radiology CT Soft Tissue Neck W Contrast  Result Date: 05/29/2022 CLINICAL DATA:  57 year old female with tooth extraction in February. Abnormal dentition, possible anterior maxillary alveolus infection/phlegmon on CT last month. EXAM: CT NECK WITH CONTRAST TECHNIQUE: Multidetector CT imaging of the neck was performed using the standard protocol following the bolus administration of intravenous contrast. RADIATION DOSE REDUCTION: This exam was performed according to the departmental dose-optimization program which includes automated exposure control, adjustment of the mA and/or kV according to patient size and/or use of iterative reconstruction technique. CONTRAST:  75mL OMNIPAQUE IOHEXOL 350 MG/ML SOLN COMPARISON:  Neck CT 05/09/2022. Plain head CT 09/06/2017. FINDINGS: Pharynx and larynx: Retropharyngeal course of both carotids, normal variant. Pharyngeal,  parapharyngeal, retropharyngeal spaces within normal limits. Glottis is closed, larynx otherwise appears negative. Salivary  glands: Negative sublingual space and submandibular glands. Negative parotid glands. See masticator space and dental findings below. Thyroid: Subcentimeter right thyroid nodule Not clinically significant; no follow-up imaging recommended (ref: J Am Coll Radiol. 2015 Feb;12(2): 143-50). Lymph nodes: No cervical lymphadenopathy. Vascular: Major vascular structures in the neck and at the skull base are patent with no significant atherosclerosis. Incidental retropharyngeal course of both carotids (normal variant). Limited intracranial: Chronic left MCA anterior division, anterior operculum region encephalomalacia on series 3, image 3 appears stable since 2019. No new abnormality of the visible brain. Visualized orbits: Negative. Mastoids and visualized paranasal sinuses: Neck Visualized paranasal sinuses and mastoids are stable and well aerated. Skeleton: Virtually absent dentition now. There is a single residual anterior right maxillary tooth with extensive but stable surrounding apical lucency. Central maxillary alveolus lucency also unchanged from last month. No new maxilla erosion. An regional soft tissues appear stable without obvious active inflammation. Masticator spaces appear normal. Widespread advanced cervical spine disc and endplate degeneration. Mandible intact and normally located. No new osseous abnormality. Upper chest: Negative aside from Calcified aortic atherosclerosis. No upper mediastinal lymphadenopathy. Lung apices are clear. IMPRESSION: 1. Subtotal absent dentition. Residual abnormal right anterior maxillary tooth with unchanged since last month surrounding and separate nearby midline maxillary alveolus bone resorption. No obvious regional soft tissue inflammation. 2. No acute or inflammatory process identified in the neck. 3.  Aortic Atherosclerosis (ICD10-I70.0). Electronically Signed   By: Odessa Fleming M.D.   On: 05/29/2022 05:42   CT ABDOMEN PELVIS W CONTRAST  Result Date: 05/29/2022 CLINICAL  DATA:  57 year old female with history of acute onset of nonlocalized abdominal pain. EXAM: CT ABDOMEN AND PELVIS WITH CONTRAST TECHNIQUE: Multidetector CT imaging of the abdomen and pelvis was performed using the standard protocol following bolus administration of intravenous contrast. RADIATION DOSE REDUCTION: This exam was performed according to the departmental dose-optimization program which includes automated exposure control, adjustment of the mA and/or kV according to patient size and/or use of iterative reconstruction technique. CONTRAST:  75mL OMNIPAQUE IOHEXOL 350 MG/ML SOLN COMPARISON:  CT of the abdomen and pelvis 05/09/2022. FINDINGS: Lower chest: Unremarkable. Hepatobiliary: Diffuse low attenuation throughout the hepatic parenchyma, indicative of a background of hepatic steatosis. No discrete cystic or solid hepatic lesions are confidently identified on today's examination. No intra or extrahepatic biliary ductal dilatation. Gallbladder is unremarkable in appearance. Pancreas: No pancreatic mass. No pancreatic ductal dilatation. No pancreatic or peripancreatic fluid collections or inflammatory changes. Spleen: Unremarkable. Adrenals/Urinary Tract: Adrenal form thickening of the left adrenal gland, likely reflective of adrenal hyperplasia. Mild diffuse cortical thinning in both kidneys. Bilateral kidneys and right adrenal gland are otherwise unremarkable in appearance. No hydroureteronephrosis. Urinary bladder is normal in appearance. Stomach/Bowel: The appearance of the stomach is normal. There is no pathologic dilatation of small bowel or colon. Normal appendix. Vascular/Lymphatic: Atherosclerosis in the abdominal aorta and pelvic vasculature, without evidence of aneurysm or dissection. No lymphadenopathy noted in the abdomen or pelvis. Reproductive: Multiple small lesions in the uterus, largest of which extends exophytically from the posterior aspect of the uterine fundus measuring up to 2 cm in  diameter, presumably small fibroids. Ovaries are atrophic and otherwise unremarkable in appearance. Other: No significant volume of ascites.  No pneumoperitoneum. Musculoskeletal: There are no aggressive appearing lytic or blastic lesions noted in the visualized portions of the skeleton. IMPRESSION: 1. No acute findings are noted in the abdomen or pelvis  to account for the patient's symptoms. 2. Hepatic steatosis. 3. Aortic atherosclerosis. 4. Left-sided adrenal hyperplasia. 5. Additional incidental findings, as above. Electronically Signed   By: Trudie Reed M.D.   On: 05/29/2022 05:35    Procedures Procedures    Medications Ordered in ED Medications  lactated ringers bolus 1,000 mL (has no administration in time range)  ondansetron (ZOFRAN) injection 4 mg (has no administration in time range)  pantoprazole (PROTONIX) injection 40 mg (has no administration in time range)  ondansetron (ZOFRAN-ODT) disintegrating tablet 4 mg (4 mg Oral Given 05/29/22 0109)    ED Course/ Medical Decision Making/ A&P                             Medical Decision Making Amount and/or Complexity of Data Reviewed Labs: ordered. Decision-making details documented in ED Course. Radiology: ordered and independent interpretation performed. Decision-making details documented in ED Course. ECG/medicine tests: ordered and independent interpretation performed. Decision-making details documented in ED Course.  Risk Prescription drug management.   Ongoing nausea, vomiting abdominal pain with difficulty swallowing since dental extractions.  Tachycardic on arrival.  Stable airway.  Abdomen soft without peritoneal signs.  Patient given IV fluids and pain and nausea control.  Labs obtained in triage show no leukocytosis.  Mild lipase elevation of 100  Patient given IV fluid, pain and nausea control.  Heart rate elevated in triage but has normalized on my assessment.  Abdomen soft without peritoneal signs.  CT scan of  March 17 did not show any acute intra-abdominal abnormalities. Labs today show normal LFTs.  Slight lipase elevation of 101.  Given patient's ongoing mouth pain and reported difficulty swallowing repeat CT soft tissue neck was performed.  This shows stable residual right maxillary tooth.  No surrounding fluid collection or drainable abscess.  Will attempt p.o. challenge and have patient follow-up with her oral surgeon/dentist.  Low suspicion for acute pancreatitis today given her lack of epigastric pain and lack of inflammation of pancreas on imaging.  Patient tolerating p.o.  No vomiting throughout ED stay.  She is able to swallow without difficulty.  CT scan results discussed with her need for follow-up with her oral surgeon.  No indication for further antibiotics.  Start with clear liquid diet and advance slowly as tolerated.  Decrease alcohol use.  May have slight case of pancreatitis though no significant inflammation appreciated on imaging today.  She is agreeable to discharge, outpatient follow-up with symptom control.  Return precautions given including intractable nausea and vomiting, severe abdominal pain, difficulty breathing, difficulty swallowing, chest pain or any other concerns.       Final Clinical Impression(s) / ED Diagnoses Final diagnoses:  Nausea and vomiting, unspecified vomiting type    Rx / DC Orders ED Discharge Orders     None         Sakia Schrimpf, Jeannett Senior, MD 05/29/22 [phone]

## 2022-05-29 NOTE — ED Triage Notes (Signed)
Pt reports generalized abdominal pain, vomiting, diarrhea. Unsure of fevers.

## 2022-06-02 ENCOUNTER — Inpatient Hospital Stay (HOSPITAL_COMMUNITY)
Admission: EM | Admit: 2022-06-02 | Discharge: 2022-06-05 | DRG: 378 | Disposition: A | Discharge status: Home / Self Care | Payer: BLUE CROSS/BLUE SHIELD | Attending: Internal Medicine | Admitting: Internal Medicine

## 2022-06-02 ENCOUNTER — Emergency Department (HOSPITAL_COMMUNITY): Payer: BLUE CROSS/BLUE SHIELD

## 2022-06-02 ENCOUNTER — Ambulatory Visit (HOSPITAL_COMMUNITY)
Admission: EM | Admit: 2022-06-02 | Discharge: 2022-06-02 | Disposition: A | Discharge status: Home / Self Care | Payer: BLUE CROSS/BLUE SHIELD

## 2022-06-02 ENCOUNTER — Encounter (HOSPITAL_COMMUNITY): Payer: Self-pay | Admitting: Emergency Medicine

## 2022-06-02 ENCOUNTER — Encounter (HOSPITAL_COMMUNITY): Payer: Self-pay

## 2022-06-02 ENCOUNTER — Ambulatory Visit (INDEPENDENT_AMBULATORY_CARE_PROVIDER_SITE_OTHER): Payer: BLUE CROSS/BLUE SHIELD | Admitting: Primary Care

## 2022-06-02 VITALS — BP 77/63 | HR 99 | Temp 97.9°F | Resp 16 | Wt 120.0 lb

## 2022-06-02 DIAGNOSIS — N179 Acute kidney failure, unspecified: Secondary | ICD-10-CM | POA: Diagnosis present

## 2022-06-02 DIAGNOSIS — K264 Chronic or unspecified duodenal ulcer with hemorrhage: Secondary | ICD-10-CM

## 2022-06-02 DIAGNOSIS — R112 Nausea with vomiting, unspecified: Secondary | ICD-10-CM | POA: Diagnosis not present

## 2022-06-02 DIAGNOSIS — D500 Iron deficiency anemia secondary to blood loss (chronic): Secondary | ICD-10-CM

## 2022-06-02 DIAGNOSIS — Z79899 Other long term (current) drug therapy: Secondary | ICD-10-CM

## 2022-06-02 DIAGNOSIS — F1010 Alcohol abuse, uncomplicated: Secondary | ICD-10-CM | POA: Insufficient documentation

## 2022-06-02 DIAGNOSIS — N3000 Acute cystitis without hematuria: Secondary | ICD-10-CM

## 2022-06-02 DIAGNOSIS — E785 Hyperlipidemia, unspecified: Secondary | ICD-10-CM | POA: Diagnosis present

## 2022-06-02 DIAGNOSIS — K0889 Other specified disorders of teeth and supporting structures: Secondary | ICD-10-CM | POA: Diagnosis present

## 2022-06-02 DIAGNOSIS — K922 Gastrointestinal hemorrhage, unspecified: Principal | ICD-10-CM | POA: Insufficient documentation

## 2022-06-02 DIAGNOSIS — Z7982 Long term (current) use of aspirin: Secondary | ICD-10-CM

## 2022-06-02 DIAGNOSIS — Z885 Allergy status to narcotic agent status: Secondary | ICD-10-CM

## 2022-06-02 DIAGNOSIS — K31811 Angiodysplasia of stomach and duodenum with bleeding: Secondary | ICD-10-CM | POA: Diagnosis not present

## 2022-06-02 DIAGNOSIS — M109 Gout, unspecified: Secondary | ICD-10-CM | POA: Diagnosis present

## 2022-06-02 DIAGNOSIS — D62 Acute posthemorrhagic anemia: Secondary | ICD-10-CM | POA: Diagnosis not present

## 2022-06-02 DIAGNOSIS — Z87891 Personal history of nicotine dependence: Secondary | ICD-10-CM

## 2022-06-02 DIAGNOSIS — E876 Hypokalemia: Secondary | ICD-10-CM | POA: Diagnosis not present

## 2022-06-02 DIAGNOSIS — I959 Hypotension, unspecified: Secondary | ICD-10-CM

## 2022-06-02 DIAGNOSIS — Z888 Allergy status to other drugs, medicaments and biological substances status: Secondary | ICD-10-CM

## 2022-06-02 DIAGNOSIS — E860 Dehydration: Secondary | ICD-10-CM | POA: Diagnosis not present

## 2022-06-02 DIAGNOSIS — K859 Acute pancreatitis without necrosis or infection, unspecified: Secondary | ICD-10-CM

## 2022-06-02 DIAGNOSIS — K029 Dental caries, unspecified: Secondary | ICD-10-CM

## 2022-06-02 DIAGNOSIS — R42 Dizziness and giddiness: Secondary | ICD-10-CM

## 2022-06-02 DIAGNOSIS — K219 Gastro-esophageal reflux disease without esophagitis: Secondary | ICD-10-CM | POA: Diagnosis present

## 2022-06-02 DIAGNOSIS — Z7984 Long term (current) use of oral hypoglycemic drugs: Secondary | ICD-10-CM

## 2022-06-02 DIAGNOSIS — Z8249 Family history of ischemic heart disease and other diseases of the circulatory system: Secondary | ICD-10-CM

## 2022-06-02 DIAGNOSIS — I10 Essential (primary) hypertension: Secondary | ICD-10-CM | POA: Diagnosis present

## 2022-06-02 LAB — CBG MONITORING, ED: Glucose-Capillary: 97 mg/dL (ref 70–99)

## 2022-06-02 LAB — COMPREHENSIVE METABOLIC PANEL
ALT: 13 U/L (ref 0–44)
AST: 20 U/L (ref 15–41)
Albumin: 2.7 g/dL — ABNORMAL LOW (ref 3.5–5.0)
Alkaline Phosphatase: 53 U/L (ref 38–126)
Anion gap: 13 (ref 5–15)
BUN: 30 mg/dL — ABNORMAL HIGH (ref 6–20)
CO2: 26 mmol/L (ref 22–32)
Calcium: 8.5 mg/dL — ABNORMAL LOW (ref 8.9–10.3)
Chloride: 97 mmol/L — ABNORMAL LOW (ref 98–111)
Creatinine, Ser: 1.38 mg/dL — ABNORMAL HIGH (ref 0.44–1.00)
GFR, Estimated: 45 mL/min — ABNORMAL LOW (ref >60)
Glucose, Bld: 107 mg/dL — ABNORMAL HIGH (ref 70–99)
Potassium: 3.7 mmol/L (ref 3.5–5.1)
Sodium: 136 mmol/L (ref 135–145)
Total Bilirubin: 0.9 mg/dL (ref 0.3–1.2)
Total Protein: 6.4 g/dL — ABNORMAL LOW (ref 6.5–8.1)

## 2022-06-02 LAB — CBC WITH DIFFERENTIAL/PLATELET
Abs Immature Granulocytes: 0.02 10*3/uL (ref 0.00–0.07)
Basophils Absolute: 0 10*3/uL (ref 0.0–0.1)
Basophils Relative: 0 %
Eosinophils Absolute: 0 10*3/uL (ref 0.0–0.5)
Eosinophils Relative: 1 %
HCT: 25.6 % — ABNORMAL LOW (ref 36.0–46.0)
Hemoglobin: 8.6 g/dL — ABNORMAL LOW (ref 12.0–15.0)
Immature Granulocytes: 1 %
Lymphocytes Relative: 26 %
Lymphs Abs: 1.1 10*3/uL (ref 0.7–4.0)
MCH: 33.2 pg (ref 26.0–34.0)
MCHC: 33.6 g/dL (ref 30.0–36.0)
MCV: 98.8 fL (ref 80.0–100.0)
Monocytes Absolute: 0.3 10*3/uL (ref 0.1–1.0)
Monocytes Relative: 7 %
Neutro Abs: 2.8 10*3/uL (ref 1.7–7.7)
Neutrophils Relative %: 65 %
Platelets: 153 10*3/uL (ref 150–400)
RBC: 2.59 MIL/uL — ABNORMAL LOW (ref 3.87–5.11)
RDW: 17.1 % — ABNORMAL HIGH (ref 11.5–15.5)
WBC: 4.3 10*3/uL (ref 4.0–10.5)
nRBC: 0 % (ref 0.0–0.2)

## 2022-06-02 LAB — TROPONIN I (HIGH SENSITIVITY): Troponin I (High Sensitivity): 7 ng/L (ref <18)

## 2022-06-02 LAB — LIPASE, BLOOD: Lipase: 153 U/L — ABNORMAL HIGH (ref 11–51)

## 2022-06-02 MED ORDER — LACTATED RINGERS IV BOLUS
1000.0000 mL | Freq: Once | INTRAVENOUS | Status: AC
  Administered 2022-06-02: 1000 mL via INTRAVENOUS

## 2022-06-02 MED ORDER — ONDANSETRON 4 MG PO TBDP: 4.0000 mg | ORAL_TABLET | Freq: Once | ORAL | Status: DC

## 2022-06-02 NOTE — ED Notes (Signed)
Pt refused EMS. [facility] at back door and notified of EMS refusal.

## 2022-06-02 NOTE — ED Provider Triage Note (Signed)
Emergency Medicine Provider Triage Evaluation Note  Christina Byrd , a 57 y.o. female  was evaluated in triage.  Pt complains of Nausea and vomiting for the past 2 months.  Denies any chest pain, does feel short of breath.  States that she is not having epigastric pain but started after vomiting.  Seen at urgent care, sent to ED because she was hypotensive.  Review of Systems  Per HPI  Physical Exam  BP 96/65 (BP Location: Left Leg)   LMP 08/23/2011  Gen:   Awake, no distress   Resp:  Normal effort  MSK:   Moves extremities without difficulty  Other:  Mildly tachycardic, abdomen is nontender.  Medical Decision Making  Medically screening exam initiated at 9:23 PM.  Appropriate orders placed.  Christina Byrd was informed that the remainder of the evaluation will be completed by another provider, this initial triage assessment does not replace that evaluation, and the importance of remaining in the ED until their evaluation is complete.     Theron Arista, PA-C 06/02/22 2124

## 2022-06-02 NOTE — ED Provider Notes (Signed)
Patient presents to urgent care for evaluation of dizziness with standing, shortness of breath with walking short distances, nausea, vomiting, and upper abdominal pain that started 2 to 3 days ago.  Patient states that whenever she drinks water or eats anything, she has to throw up almost immediately.  Her family member states that the emesis is described as "coffee" in appearance.  Denies dark/tarry emesis.  No blood/mucus in the stools.  No lower abdominal pain, diarrhea, chest pain, or headache.  No urinary symptoms.  Patient was seen by her PCP today where only her chronic HTN was addressed. Patient was discharged from PCP office today with BP of 77/63. Prior to this, BP in the ED at visit was 115/80. She has not taken her antihypertensive medication today. She has not attempted use of any over the counter medications to help with her nausea, vomiting, or abdominal pain.   BP Readings from Last 3 Encounters:  06/02/22 (!) 89/67  06/02/22 (!) 77/63  05/29/22 115/80   Currently sitting in wheelchair in no acute distress. Neurologic exam is non-focal. Cardiopulmonary exam is stable, so is abdominal exam. She appears dehydrated with dry mucous membranes and mild tachycardia. Currently nauseous and experiencing episodes of emesis in clinic. Documented history of type 2 diabetes (patient declines this), CBG 97. She is ill-appearing, however non-toxic in appearance.  Recommend transport to the ED via [facility] due to dizziness, tachycardia, hypotension, and persistent nausea and vomiting. Patient initially agreed to [facility] transport reluctantly, however after attempt to start IV unsuccessfully, she states she would like to "just go home". Discussed risks of deferring ED visit at length with patient who states she would like to go to the ER by herself as she is "scared to go in the ambulance". Given she is neurologically stable with soft, but stable BP and with family member, I am agreeable with this. [facility]  notified of patient refusal. Patient discharged from urgent care in stable condition with family member.    Carlisle Beers, [place] 06/02/22 2125

## 2022-06-02 NOTE — ED Triage Notes (Addendum)
Pt had 9 teeth pulled in March. Reports she wasn't given antibiotics and ended up in the hospital from it. Reports that ever since she got back home from the hospital she has had abdominal pain and emesis and has lost a lot of weight. She saw pcp today but did not address it.   Pt is actively vomiting in intake and reports it is all day since the weekend.

## 2022-06-02 NOTE — ED Triage Notes (Signed)
PT reports he cannot hold down any fluids or food. She is hypotensive in triage but alert and oriented. Reports episodes of dizziness recently. This has been ongoing issue for 2 months.

## 2022-06-03 ENCOUNTER — Inpatient Hospital Stay (HOSPITAL_COMMUNITY): Payer: BLUE CROSS/BLUE SHIELD | Admitting: Anesthesiology

## 2022-06-03 ENCOUNTER — Other Ambulatory Visit: Payer: Self-pay

## 2022-06-03 ENCOUNTER — Encounter (HOSPITAL_COMMUNITY)
Admission: EM | Disposition: A | Discharge status: Home / Self Care | Payer: Self-pay | Source: Home / Self Care | Attending: Internal Medicine

## 2022-06-03 ENCOUNTER — Encounter (HOSPITAL_COMMUNITY): Payer: Self-pay | Admitting: Internal Medicine

## 2022-06-03 DIAGNOSIS — R634 Abnormal weight loss: Secondary | ICD-10-CM

## 2022-06-03 DIAGNOSIS — K921 Melena: Secondary | ICD-10-CM | POA: Diagnosis not present

## 2022-06-03 DIAGNOSIS — D500 Iron deficiency anemia secondary to blood loss (chronic): Secondary | ICD-10-CM

## 2022-06-03 DIAGNOSIS — D62 Acute posthemorrhagic anemia: Secondary | ICD-10-CM | POA: Diagnosis not present

## 2022-06-03 DIAGNOSIS — Z87891 Personal history of nicotine dependence: Secondary | ICD-10-CM | POA: Diagnosis not present

## 2022-06-03 DIAGNOSIS — K920 Hematemesis: Secondary | ICD-10-CM

## 2022-06-03 DIAGNOSIS — R112 Nausea with vomiting, unspecified: Secondary | ICD-10-CM

## 2022-06-03 DIAGNOSIS — Z8249 Family history of ischemic heart disease and other diseases of the circulatory system: Secondary | ICD-10-CM | POA: Diagnosis not present

## 2022-06-03 DIAGNOSIS — Z7982 Long term (current) use of aspirin: Secondary | ICD-10-CM | POA: Diagnosis not present

## 2022-06-03 DIAGNOSIS — K31811 Angiodysplasia of stomach and duodenum with bleeding: Secondary | ICD-10-CM | POA: Diagnosis present

## 2022-06-03 DIAGNOSIS — K269 Duodenal ulcer, unspecified as acute or chronic, without hemorrhage or perforation: Secondary | ICD-10-CM | POA: Diagnosis not present

## 2022-06-03 DIAGNOSIS — F1010 Alcohol abuse, uncomplicated: Secondary | ICD-10-CM

## 2022-06-03 DIAGNOSIS — K2950 Unspecified chronic gastritis without bleeding: Secondary | ICD-10-CM

## 2022-06-03 DIAGNOSIS — M109 Gout, unspecified: Secondary | ICD-10-CM | POA: Diagnosis present

## 2022-06-03 DIAGNOSIS — K264 Chronic or unspecified duodenal ulcer with hemorrhage: Secondary | ICD-10-CM | POA: Diagnosis present

## 2022-06-03 DIAGNOSIS — E785 Hyperlipidemia, unspecified: Secondary | ICD-10-CM | POA: Diagnosis present

## 2022-06-03 DIAGNOSIS — E876 Hypokalemia: Secondary | ICD-10-CM | POA: Diagnosis not present

## 2022-06-03 DIAGNOSIS — Z79899 Other long term (current) drug therapy: Secondary | ICD-10-CM | POA: Diagnosis not present

## 2022-06-03 DIAGNOSIS — K922 Gastrointestinal hemorrhage, unspecified: Secondary | ICD-10-CM | POA: Diagnosis present

## 2022-06-03 DIAGNOSIS — Z885 Allergy status to narcotic agent status: Secondary | ICD-10-CM | POA: Diagnosis not present

## 2022-06-03 DIAGNOSIS — N179 Acute kidney failure, unspecified: Secondary | ICD-10-CM | POA: Diagnosis present

## 2022-06-03 DIAGNOSIS — I10 Essential (primary) hypertension: Secondary | ICD-10-CM | POA: Diagnosis present

## 2022-06-03 DIAGNOSIS — Z7984 Long term (current) use of oral hypoglycemic drugs: Secondary | ICD-10-CM | POA: Diagnosis not present

## 2022-06-03 DIAGNOSIS — Z888 Allergy status to other drugs, medicaments and biological substances status: Secondary | ICD-10-CM | POA: Diagnosis not present

## 2022-06-03 DIAGNOSIS — K219 Gastro-esophageal reflux disease without esophagitis: Secondary | ICD-10-CM | POA: Diagnosis present

## 2022-06-03 DIAGNOSIS — K0889 Other specified disorders of teeth and supporting structures: Secondary | ICD-10-CM | POA: Diagnosis present

## 2022-06-03 HISTORY — PX: BIOPSY: SHX5522

## 2022-06-03 HISTORY — PX: HOT HEMOSTASIS: SHX5433

## 2022-06-03 HISTORY — PX: ESOPHAGOGASTRODUODENOSCOPY (EGD) WITH PROPOFOL: SHX5813

## 2022-06-03 LAB — POCT I-STAT, CHEM 8
BUN: 25 mg/dL — ABNORMAL HIGH (ref 6–20)
Calcium, Ion: 0.99 mmol/L — ABNORMAL LOW (ref 1.15–1.40)
Chloride: 106 mmol/L (ref 98–111)
Creatinine, Ser: 1 mg/dL (ref 0.44–1.00)
Glucose, Bld: 74 mg/dL (ref 70–99)
HCT: 31 % — ABNORMAL LOW (ref 36.0–46.0)
Hemoglobin: 10.5 g/dL — ABNORMAL LOW (ref 12.0–15.0)
Potassium: 3.5 mmol/L (ref 3.5–5.1)
Sodium: 140 mmol/L (ref 135–145)
TCO2: 22 mmol/L (ref 22–32)

## 2022-06-03 LAB — URINALYSIS, ROUTINE W REFLEX MICROSCOPIC
Bilirubin Urine: NEGATIVE
Glucose, UA: NEGATIVE mg/dL
Hgb urine dipstick: NEGATIVE
Ketones, ur: 5 mg/dL — AB
Nitrite: NEGATIVE
Protein, ur: 30 mg/dL — AB
Specific Gravity, Urine: 1.019 (ref 1.005–1.030)
pH: 6 (ref 5.0–8.0)

## 2022-06-03 LAB — BPAM RBC: Unit Type and Rh: 1700

## 2022-06-03 LAB — HIV ANTIBODY (ROUTINE TESTING W REFLEX): HIV Screen 4th Generation wRfx: NONREACTIVE

## 2022-06-03 LAB — TYPE AND SCREEN: Unit division: 0

## 2022-06-03 LAB — RAPID URINE DRUG SCREEN, HOSP PERFORMED
Amphetamines: NOT DETECTED
Barbiturates: NOT DETECTED
Benzodiazepines: NOT DETECTED
Cocaine: NOT DETECTED
Opiates: NOT DETECTED
Tetrahydrocannabinol: NOT DETECTED

## 2022-06-03 LAB — CBC
HCT: 26.7 % — ABNORMAL LOW (ref 36.0–46.0)
Hemoglobin: 9.4 g/dL — ABNORMAL LOW (ref 12.0–15.0)
MCH: 32.5 pg (ref 26.0–34.0)
MCHC: 35.2 g/dL (ref 30.0–36.0)
MCV: 92.4 fL (ref 80.0–100.0)
Platelets: 132 10*3/uL — ABNORMAL LOW (ref 150–400)
RBC: 2.89 MIL/uL — ABNORMAL LOW (ref 3.87–5.11)
RDW: 17 % — ABNORMAL HIGH (ref 11.5–15.5)
WBC: 6.7 10*3/uL (ref 4.0–10.5)
nRBC: 0 % (ref 0.0–0.2)

## 2022-06-03 LAB — PREPARE RBC (CROSSMATCH)

## 2022-06-03 LAB — PROTIME-INR
INR: 1.2 (ref 0.8–1.2)
Prothrombin Time: 14.7 seconds (ref 11.4–15.2)

## 2022-06-03 LAB — TROPONIN I (HIGH SENSITIVITY): Troponin I (High Sensitivity): 7 ng/L (ref <18)

## 2022-06-03 LAB — HEMOGLOBIN AND HEMATOCRIT, BLOOD
HCT: 20.7 % — ABNORMAL LOW (ref 36.0–46.0)
HCT: 26.2 % — ABNORMAL LOW (ref 36.0–46.0)
Hemoglobin: 6.7 g/dL — CL (ref 12.0–15.0)
Hemoglobin: 9.5 g/dL — ABNORMAL LOW (ref 12.0–15.0)

## 2022-06-03 LAB — POC OCCULT BLOOD, ED: Fecal Occult Bld: POSITIVE — AB

## 2022-06-03 SURGERY — ESOPHAGOGASTRODUODENOSCOPY (EGD) WITH PROPOFOL
Anesthesia: Monitor Anesthesia Care

## 2022-06-03 MED ORDER — HYDRALAZINE HCL 20 MG/ML IJ SOLN: 5.0000 mg | Freq: Four times a day (QID) | INTRAMUSCULAR | Status: DC | PRN

## 2022-06-03 MED ORDER — PROPOFOL 500 MG/50ML IV EMUL
INTRAVENOUS | Status: DC | PRN
  Administered 2022-06-03: 20 mg via INTRAVENOUS
  Administered 2022-06-03: 150 ug/kg/min via INTRAVENOUS

## 2022-06-03 MED ORDER — LEVETIRACETAM 500 MG PO TABS
500.0000 mg | ORAL_TABLET | Freq: Two times a day (BID) | ORAL | Status: DC
  Administered 2022-06-03 – 2022-06-05 (×3): 500 mg via ORAL
  Filled 2022-06-03 (×4): qty 1

## 2022-06-03 MED ORDER — ASPIRIN 81 MG PO TBEC: 81.0000 mg | DELAYED_RELEASE_TABLET | Freq: Every day | ORAL | Status: DC

## 2022-06-03 MED ORDER — ALLOPURINOL 100 MG PO TABS
100.0000 mg | ORAL_TABLET | Freq: Every day | ORAL | Status: DC
  Administered 2022-06-04 – 2022-06-05 (×2): 100 mg via ORAL
  Filled 2022-06-03 (×2): qty 1

## 2022-06-03 MED ORDER — ONDANSETRON HCL 4 MG/2ML IJ SOLN
4.0000 mg | Freq: Four times a day (QID) | INTRAMUSCULAR | Status: DC | PRN
  Administered 2022-06-03: 4 mg via INTRAVENOUS
  Filled 2022-06-03: qty 2

## 2022-06-03 MED ORDER — LEVETIRACETAM 500 MG PO TABS: 500.0000 mg | ORAL_TABLET | Freq: Two times a day (BID) | ORAL | Status: DC

## 2022-06-03 MED ORDER — SODIUM CHLORIDE 0.9 % IV SOLN: INTRAVENOUS | Status: DC

## 2022-06-03 MED ORDER — PANTOPRAZOLE 80MG IVPB - SIMPLE MED
80.0000 mg | Freq: Once | INTRAVENOUS | Status: AC
  Administered 2022-06-03: 80 mg via INTRAVENOUS
  Filled 2022-06-03: qty 100

## 2022-06-03 MED ORDER — SODIUM CHLORIDE 0.9% IV SOLUTION: Freq: Once | INTRAVENOUS | Status: AC

## 2022-06-03 MED ORDER — TIZANIDINE HCL 4 MG PO TABS
4.0000 mg | ORAL_TABLET | Freq: Three times a day (TID) | ORAL | Status: DC | PRN
  Filled 2022-06-03: qty 1

## 2022-06-03 MED ORDER — PHENYLEPHRINE 80 MCG/ML (10ML) SYRINGE FOR IV PUSH (FOR BLOOD PRESSURE SUPPORT)
PREFILLED_SYRINGE | INTRAVENOUS | Status: DC | PRN
  Administered 2022-06-03: 80 ug via INTRAVENOUS

## 2022-06-03 MED ORDER — THIAMINE MONONITRATE 100 MG PO TABS: 100.0000 mg | ORAL_TABLET | Freq: Every day | ORAL | Status: DC

## 2022-06-03 MED ORDER — ATORVASTATIN CALCIUM 10 MG PO TABS
20.0000 mg | ORAL_TABLET | Freq: Every day | ORAL | Status: DC
  Administered 2022-06-03 – 2022-06-05 (×3): 20 mg via ORAL
  Filled 2022-06-03 (×3): qty 2

## 2022-06-03 MED ORDER — FOLIC ACID 1 MG PO TABS
1.0000 mg | ORAL_TABLET | Freq: Every day | ORAL | Status: DC
  Administered 2022-06-03 – 2022-06-05 (×3): 1 mg via ORAL
  Filled 2022-06-03 (×3): qty 1

## 2022-06-03 MED ORDER — METOCLOPRAMIDE HCL 5 MG/ML IJ SOLN: 10.0000 mg | Freq: Once | INTRAMUSCULAR | Status: DC

## 2022-06-03 MED ORDER — LACTATED RINGERS IV SOLN: INTRAVENOUS | Status: DC

## 2022-06-03 MED ORDER — LORAZEPAM 2 MG/ML IJ SOLN: 1.0000 mg | INTRAMUSCULAR | Status: DC | PRN

## 2022-06-03 MED ORDER — PANTOPRAZOLE SODIUM 40 MG IV SOLR: 40.0000 mg | Freq: Two times a day (BID) | INTRAVENOUS | Status: DC

## 2022-06-03 MED ORDER — LIDOCAINE 2% (20 MG/ML) 5 ML SYRINGE
INTRAMUSCULAR | Status: DC | PRN
  Administered 2022-06-03: 40 mg via INTRAVENOUS

## 2022-06-03 MED ORDER — ADULT MULTIVITAMIN W/MINERALS CH
1.0000 | ORAL_TABLET | Freq: Every day | ORAL | Status: DC
  Administered 2022-06-03 – 2022-06-04 (×2): 1 via ORAL
  Filled 2022-06-03 (×3): qty 1

## 2022-06-03 MED ORDER — SUCRALFATE 1 G PO TABS
1.0000 g | ORAL_TABLET | Freq: Three times a day (TID) | ORAL | Status: DC
  Administered 2022-06-03 – 2022-06-05 (×6): 1 g via ORAL
  Filled 2022-06-03 (×7): qty 1

## 2022-06-03 MED ORDER — PANTOPRAZOLE INFUSION (NEW) - SIMPLE MED
8.0000 mg/h | INTRAVENOUS | Status: DC
  Administered 2022-06-03: 8 mg/h via INTRAVENOUS
  Filled 2022-06-03 (×2): qty 100

## 2022-06-03 MED ORDER — LORAZEPAM 1 MG PO TABS: 1.0000 mg | ORAL_TABLET | ORAL | Status: DC | PRN

## 2022-06-03 MED ORDER — VENLAFAXINE HCL ER 37.5 MG PO CP24
37.5000 mg | ORAL_CAPSULE | Freq: Every day | ORAL | Status: DC
  Administered 2022-06-05: 37.5 mg via ORAL
  Filled 2022-06-03 (×3): qty 1

## 2022-06-03 MED ORDER — THIAMINE MONONITRATE 100 MG PO TABS
100.0000 mg | ORAL_TABLET | Freq: Every day | ORAL | Status: DC
  Administered 2022-06-03 – 2022-06-05 (×3): 100 mg via ORAL
  Filled 2022-06-03 (×3): qty 1

## 2022-06-03 MED ORDER — SODIUM CHLORIDE 0.9 % IV BOLUS
1000.0000 mL | Freq: Once | INTRAVENOUS | Status: AC
  Administered 2022-06-03: 1000 mL via INTRAVENOUS

## 2022-06-03 MED ORDER — FENOFIBRATE 54 MG PO TABS
54.0000 mg | ORAL_TABLET | Freq: Every day | ORAL | Status: DC
  Administered 2022-06-04 – 2022-06-05 (×2): 54 mg via ORAL
  Filled 2022-06-03 (×3): qty 1

## 2022-06-03 MED ORDER — THIAMINE HCL 100 MG/ML IJ SOLN
100.0000 mg | Freq: Every day | INTRAMUSCULAR | Status: DC
  Filled 2022-06-03: qty 2

## 2022-06-03 SURGICAL SUPPLY — 15 items

## 2022-06-03 NOTE — Anesthesia Preprocedure Evaluation (Addendum)
Anesthesia Evaluation  Patient identified by MRN, date of birth, ID band Patient awake    Reviewed: Allergy & Precautions, NPO status , Patient's Chart, lab work & pertinent test results  History of Anesthesia Complications Negative for: history of anesthetic complications  Airway Mallampati: II  TM Distance: >3 FB Neck ROM: Full    Dental  (+) Edentulous Lower, Edentulous Upper   Pulmonary former smoker   Pulmonary exam normal        Cardiovascular hypertension, Pt. on medications Normal cardiovascular exam     Neuro/Psych Seizures -,    Depression       GI/Hepatic Neg liver ROS,GERD  Controlled,,Upper GI Bleed   Endo/Other  diabetes, Type 2    Renal/GU negative Renal ROS  negative genitourinary   Musculoskeletal  (+) Arthritis ,    Abdominal   Peds  Hematology  (+) Blood dyscrasia (Hgb 6.7), anemia   Anesthesia Other Findings Day of surgery medications reviewed with patient.  Reproductive/Obstetrics                              Anesthesia Physical Anesthesia Plan  ASA: 3 and emergent  Anesthesia Plan: MAC   Post-op Pain Management: Minimal or no pain anticipated   Induction:   PONV Risk Score and Plan: 2 and Treatment may vary due to age or medical condition and Propofol infusion  Airway Management Planned: Natural Airway and Nasal Cannula  Additional Equipment: None  Intra-op Plan:   Post-operative Plan:   Informed Consent: I have reviewed the patients History and Physical, chart, labs and discussed the procedure including the risks, benefits and alternatives for the proposed anesthesia with the patient or authorized representative who has indicated his/her understanding and acceptance.       Plan Discussed with: CRNA  Anesthesia Plan Comments:         Anesthesia Quick Evaluation

## 2022-06-03 NOTE — Op Note (Signed)
[facility] Patient Name: Christina Byrd Procedure Date : 06/03/2022 MRN: [id number] Attending MD: Dub Amis. [name] , MD, [id number] Date of Birth: Mar 14, 1965 CSN: [id number] Age: 57 Admit Type: Outpatient Procedure:                Upper GI endoscopy Indications:              Hematemesis, Melena Providers:                Lorin Picket E. [name], MD, Delton Prairie, RN, Kandice Robinsons, Technician Referring MD:              Medicines:                Monitored Anesthesia Care Complications:            No immediate complications. Estimated Blood Loss:     Estimated blood loss was minimal. Procedure:                Pre-Anesthesia Assessment:                           - Prior to the procedure, a History and Physical                            was performed, and patient medications and                            allergies were reviewed. The patient's tolerance of                            previous anesthesia was also reviewed. The risks                            and benefits of the procedure and the sedation                            options and risks were discussed with the patient.                            All questions were answered, and informed consent                            was obtained. Prior Anticoagulants: The patient has                            taken no anticoagulant or antiplatelet agents.                            After reviewing the risks and benefits, the patient                            was deemed in satisfactory condition to undergo the  procedure.                           After obtaining informed consent, the endoscope was                            passed under direct vision. Throughout the                            procedure, the patient's blood pressure, pulse, and                            oxygen saturations were monitored continuously. The                            [id number]-[id number] ([id number]  Olympus endoscope was introduced                            through the mouth, and advanced to the second part                            of duodenum. The upper GI endoscopy was                            accomplished without difficulty. The patient                            tolerated the procedure well. Scope In: Scope Out: Findings:      The examined portions of the nasopharynx, oropharynx and larynx were       normal.      The examined esophagus was normal.      Clotted blood was found in the gastric fundus.      A single 5 mm angioectasia vs focal gastritis with scant oozing was       found in the gastric body. Coagulation for hemostasis using argon plasma       was successful. Estimated blood loss: none.      The exam of the stomach was otherwise normal.      Biopsies were taken with a cold forceps in the gastric antrum for       Helicobacter pylori testing. Estimated blood loss was minimal.      One non-bleeding cratered duodenal ulcer with a small flat pigmented       spot at the periphery of the ulcer (Forrest Class IIc) was found in the       duodenal bulb. The lesion was 10 mm in largest dimension.      The exam of the duodenum was otherwise normal. Impression:               - The examined portions of the nasopharynx,                            oropharynx and larynx were normal.                           - Normal esophagus.                           -  Clotted blood in the gastric fundus.                           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                            Treated with argon plasma coagulation (APC).                           - Non-bleeding duodenal ulcer with a flat pigmented                            spot (Forrest Class IIc). Hemostatic interventions                            not felt to be necessary. Anticipate lesion will                            continue to heal with medical therapy.                           - Biopsies were taken  with a cold forceps for                            Helicobacter pylori testing. Moderate Sedation:      N/A Recommendation:           - Return patient to hospital ward for ongoing care.                           - Full liquid diet today, advance to regular diet                            tomorrow if no evidence of rebleed.                           - Continue present medications.                           - Continue IV PPI BID today, transition to PO                            tomorrow. Patient will need to be on BID PPI for 8                            weeks                           - Start carafate suspension, 1 gram QID for 2 weeks                           - Await pathology results.                           - Stop diclofenac which was the likely  cause of the                            patient's ulcer                           - Recommend Tylenol or other non-NSAID for pain. Procedure Code(s):        --- Professional ---                           [phone], 59, Esophagogastroduodenoscopy, flexible,                            transoral; with control of bleeding, any method                           43239, Esophagogastroduodenoscopy, flexible,                            transoral; with biopsy, single or multiple Diagnosis Code(s):        --- Professional ---                           K92.2, Gastrointestinal hemorrhage, unspecified                           K31.811, Angiodysplasia of stomach and duodenum                            with bleeding                           K26.9, Duodenal ulcer, unspecified as acute or                            chronic, without hemorrhage or perforation                           K92.0, Hematemesis                           K92.1, Melena (includes Hematochezia) CPT copyright 2022 [employer]. All rights reserved. The codes documented in this report are preliminary and upon coder review may  be revised to meet current compliance  requirements. Jamice Carreno E. [name], MD 06/03/2022 2:48:17 PM This report has been signed electronically. Number of Addenda: 0

## 2022-06-03 NOTE — Consult Note (Addendum)
Consultation  Referring Provider: Dr. Chipper HerbZhang    Primary Care Physician:  Grayce SessionsEdwards, Michelle P, NP Primary Gastroenterologist: Gentry FitzUnassigned       Reason for Consultation: Upper GI bleed, alcohol abuse         HPI:   Christina Byrd is a 57 y.o. female with a past medical history as listed below including GERD, who presented to the ER today with nausea.  Described bilious material and coffee grounds when vomiting and melena.    At time of presentation patient described dizziness with standing, shortness of breath with walking short distances, nausea, vomiting and upper abdominal pain for 2 to 3 days.  Her vomit was described as "coffee-ground".  Apparently seen by her PCP yesterday for chronic hypertension and was discharged with a BP of 77/63.    Today, the patient is a poor historian but tells me that all of her trouble started after she had her front teeth pulled on 03/30/2022.  Apparently she had some swelling of her mouth and even face after that point and was eventually given some antibiotics after being seen in the ED, but continued with issues with nausea and vomiting.  She tells me since that point she has never been able to eat or drink anything, even sips of water seem to come out.  Tells me it has been 2 months since she has gotten any food inside of her.  Tells me that she has lost all of her weight, apparently previously 190 pounds now, 119.  She does tell me she has chronic reflux but has been having trouble with this since she has not been eating.  She only takes medicine for that as needed.  Tells me she really has not been able to get any of her meds and due to the nausea and vomiting.  Denies any NSAID use.  Shows me pictures on her phone of her vomit which has been streaked with a brown material for some time over the past couple of months, what scared her recently was that she started having black stools anytime she would go to the bathroom which started around April 3.  Tells me  initially started to clear up but now it has come back, her last 1 in the ER.  She is vomiting at time of my interview with bright red blood.  Denies any abdominal pain and tells me she just has "muscle soreness from vomiting".    Relays a lot of concerns regarding her primary care team.  Apparently she feels like they do not listen to her and that she is behind on a lot of her health care. Does not describe strong etoh history to me at time of interview.    Denies fever or chills.  ED course: Creatinine 1.38, BUN 30, CBC with hemoglobin of 8.6, lipase 153, fecal occult positive, chest x-ray normal  GI history: None  Past Medical History:  Diagnosis Date  . GASTROESOPHAGEAL REFLUX, NO ESOPHAGITIS 04/21/2006   Qualifier: Diagnosis of  By: Levada SchillingWATT, JOANNE    . GERD (gastroesophageal reflux disease) Dx 1995  . HTN (hypertension)     History reviewed. No pertinent surgical history.  Family History  Problem Relation Age of Onset  . CAD Mother     Social History   Tobacco Use  . Smoking status: Former    Types: Cigarettes    Quit date: 06/25/2012    Years since quitting: 9.9  . Smokeless tobacco: Never  Vaping Use  .  Vaping Use: Never used  Substance Use Topics  . Alcohol use: Not Currently    Alcohol/week: 49.0 standard drinks of alcohol    Types: 14 Cans of beer, 35 Shots of liquor per week  . Drug use: No    Prior to Admission medications   Medication Sig Start Date End Date Taking? Authorizing Provider  allopurinol (ZYLOPRIM) 100 MG tablet Take 1 tablet (100 mg total) by mouth daily. 01/13/22   Grayce Sessions, NP  amLODipine (NORVASC) 10 MG tablet TAKE 1 TABLET(10 MG) BY MOUTH DAILY 05/22/22   Grayce Sessions, NP  aspirin EC 81 MG tablet Take 1 tablet (81 mg total) by mouth daily. 10/26/12   Rai, Ripudeep K, MD  atorvastatin (LIPITOR) 20 MG tablet TAKE 1 TABLET(20 MG) BY MOUTH DAILY 03/12/22   Grayce Sessions, NP  Blood Glucose Monitoring Suppl (TRUE METRIX METER) DEVI  1 kit by Does not apply route 3 (three) times daily. CHECK BLOOD SUGAR UP TO 3 TIMES DAILY. E11.9 02/27/19   Hoy Register, MD  clindamycin (CLEOCIN T) 1 % external solution Apply topically 2 (two) times daily.    [provider]  cloNIDine (CATAPRES) 0.1 MG tablet : TAKE 1 TABLET(0.1 MG) BY MOUTH AT BEDTIME AS NEEDED FOR HOT FLASHES 05/09/22   Grayce Sessions, NP  diclofenac (VOLTAREN) 75 MG EC tablet TAKE 1 TABLET(75 MG) BY MOUTH TWICE DAILY 02/01/22   Grayce Sessions, NP  fenofibrate (TRICOR) 48 MG tablet Take 1 tablet (48 mg total) by mouth daily. 04/28/22   Grayce Sessions, NP  fluticasone (FLONASE) 50 MCG/ACT nasal spray USE 2 SPRAYS IN EACH NOSTRIL DAILY 06/25/21   Hoy Register, MD  folic acid (FOLVITE) 1 MG tablet TAKE 1 TABLET(1 MG) BY MOUTH DAILY 04/29/22   Van Clines, MD  glucose blood (TRUE METRIX BLOOD GLUCOSE TEST) test strip Use as instructed 03/06/20   Hoy Register, MD  ketoconazole (NIZORAL) 2 % cream Apply 1 Application topically daily.    [provider]  levETIRAcetam (KEPPRA) 500 MG tablet Take 1 tablet (500 mg total) by mouth 2 (two) times daily. 05/04/21   Van Clines, MD  LORazepam (ATIVAN) 0.5 MG tablet take 1 tab by mouth once 30 minutes before MRI 05/06/21   Hoy Register, MD  magnesium oxide (MAG-OX) 400 (240 Mg) MG tablet TAKE 1 TABLET BY MOUTH EVERY DAY 06/24/21   Hoy Register, MD  omega-3 acid ethyl esters (LOVAZA) 1 g capsule Take 2 capsules (2 g total) by mouth 2 (two) times daily. 10/06/21   Hoy Register, MD  ondansetron (ZOFRAN-ODT) 4 MG disintegrating tablet Take 1 tablet (4 mg total) by mouth every 8 (eight) hours as needed for nausea or vomiting. 05/29/22   Rancour, Jeannett Senior, MD  pantoprazole (PROTONIX) 40 MG tablet TAKE 1 TABLET(40 MG) BY MOUTH DAILY 03/12/22   Grayce Sessions, NP  thiamine (VITAMIN B-1) 100 MG tablet Take 1 tablet (100 mg total) by mouth daily. 07/28/20   Van Clines, MD  tiZANidine (ZANAFLEX) 4 MG  tablet TAKE 1 TABLET BY MOUTH EVERY 8 HOURS AS NEEDED FOR MUSCLE SPASMS 11/02/21   Hoy Register, MD  TRUEplus Lancets 28G MISC SMARTSIG:Topical 1 to 3 Times Daily 03/06/20   Hoy Register, MD  venlafaxine XR (EFFEXOR XR) 37.5 MG 24 hr capsule Take 1 capsule (37.5 mg total) by mouth daily with breakfast. For hot flashes 10/05/21   Hoy Register, MD  vitamin B-12 (CYANOCOBALAMIN) 1000 MCG tablet Take 1  tablet (1,000 mcg total) by mouth daily. 07/28/20   Van Clines, MD  metFORMIN (GLUCOPHAGE) 500 MG tablet Take 0.5 tablets (250 mg total) by mouth daily with breakfast. 03/05/20 04/02/20  Anders Simmonds, PA-C    Current Facility-Administered Medications  Medication Dose Route Frequency Provider Last Rate Last Admin  . 0.9 %  sodium chloride infusion   Intravenous Continuous Palumbo, April, MD 125 mL/hr at 06/03/22 0332 New Bag at 06/03/22 0332  . allopurinol (ZYLOPRIM) tablet 100 mg  100 mg Oral Daily Mikey College T, MD      . aspirin EC tablet 81 mg  81 mg Oral Daily Mikey College T, MD      . atorvastatin (LIPITOR) tablet 20 mg  20 mg Oral Daily Mikey College T, MD      . fenofibrate tablet 54 mg  54 mg Oral Daily Mikey College T, MD      . folic acid (FOLVITE) tablet 1 mg  1 mg Oral Daily Mikey College T, MD      . hydrALAZINE (APRESOLINE) injection 5 mg  5 mg Intravenous Q6H PRN Mikey College T, MD      . levETIRAcetam (KEPPRA) tablet 500 mg  500 mg Oral BID Mikey College T, MD      . LORazepam (ATIVAN) tablet 1-4 mg  1-4 mg Oral Q1H PRN Emeline General, MD       Or  . LORazepam (ATIVAN) injection 1-4 mg  1-4 mg Intravenous Q1H PRN Mikey College T, MD      . multivitamin with minerals tablet 1 tablet  1 tablet Oral Daily Mikey College T, MD      . Melene Muller ON 06/06/2022] pantoprazole (PROTONIX) injection 40 mg  40 mg Intravenous Q12H Palumbo, April, MD      . pantoprozole (PROTONIX) 80 mg /NS 100 mL infusion  8 mg/hr Intravenous Continuous Palumbo, April, MD 10 mL/hr at 06/03/22 0333 8 mg/hr at 06/03/22 0333   . thiamine (VITAMIN B1) tablet 100 mg  100 mg Oral Daily Mikey College T, MD       Or  . thiamine (VITAMIN B1) injection 100 mg  100 mg Intravenous Daily Mikey College T, MD      . tiZANidine (ZANAFLEX) tablet 4 mg  4 mg Oral Q8H PRN Mikey College T, MD      . venlafaxine XR (EFFEXOR-XR) 24 hr capsule 37.5 mg  37.5 mg Oral Q breakfast Emeline General, MD       Current Outpatient Medications  Medication Sig Dispense Refill  . allopurinol (ZYLOPRIM) 100 MG tablet Take 1 tablet (100 mg total) by mouth daily. 30 tablet 6  . amLODipine (NORVASC) 10 MG tablet TAKE 1 TABLET(10 MG) BY MOUTH DAILY 90 tablet 1  . aspirin EC 81 MG tablet Take 1 tablet (81 mg total) by mouth daily.    Marland Kitchen atorvastatin (LIPITOR) 20 MG tablet TAKE 1 TABLET(20 MG) BY MOUTH DAILY 90 tablet 1  . Blood Glucose Monitoring Suppl (TRUE METRIX METER) DEVI 1 kit by Does not apply route 3 (three) times daily. CHECK BLOOD SUGAR UP TO 3 TIMES DAILY. E11.9 1 each 0  . clindamycin (CLEOCIN T) 1 % external solution Apply topically 2 (two) times daily.    . cloNIDine (CATAPRES) 0.1 MG tablet : TAKE 1 TABLET(0.1 MG) BY MOUTH AT BEDTIME AS NEEDED FOR HOT FLASHES 90 tablet 1  . diclofenac (VOLTAREN) 75 MG EC tablet TAKE 1 TABLET(75 MG) BY MOUTH TWICE DAILY 180 tablet  1  . fenofibrate (TRICOR) 48 MG tablet Take 1 tablet (48 mg total) by mouth daily. 90 tablet 3  . fluticasone (FLONASE) 50 MCG/ACT nasal spray USE 2 SPRAYS IN EACH NOSTRIL DAILY 16 g 1  . folic acid (FOLVITE) 1 MG tablet TAKE 1 TABLET(1 MG) BY MOUTH DAILY 30 tablet 0  . glucose blood (TRUE METRIX BLOOD GLUCOSE TEST) test strip Use as instructed 100 each 12  . ketoconazole (NIZORAL) 2 % cream Apply 1 Application topically daily.    Marland Kitchen levETIRAcetam (KEPPRA) 500 MG tablet Take 1 tablet (500 mg total) by mouth 2 (two) times daily. 180 tablet 3  . LORazepam (ATIVAN) 0.5 MG tablet take 1 tab by mouth once 30 minutes before MRI 1 tablet 0  . magnesium oxide (MAG-OX) 400 (240 Mg) MG tablet TAKE  1 TABLET BY MOUTH EVERY DAY 90 tablet 0  . omega-3 acid ethyl esters (LOVAZA) 1 g capsule Take 2 capsules (2 g total) by mouth 2 (two) times daily. 60 capsule 6  . ondansetron (ZOFRAN-ODT) 4 MG disintegrating tablet Take 1 tablet (4 mg total) by mouth every 8 (eight) hours as needed for nausea or vomiting. 20 tablet 0  . pantoprazole (PROTONIX) 40 MG tablet TAKE 1 TABLET(40 MG) BY MOUTH DAILY 90 tablet 1  . thiamine (VITAMIN B-1) 100 MG tablet Take 1 tablet (100 mg total) by mouth daily. 30 tablet 11  . tiZANidine (ZANAFLEX) 4 MG tablet TAKE 1 TABLET BY MOUTH EVERY 8 HOURS AS NEEDED FOR MUSCLE SPASMS 120 tablet 1  . TRUEplus Lancets 28G MISC SMARTSIG:Topical 1 to 3 Times Daily 100 each 3  . venlafaxine XR (EFFEXOR XR) 37.5 MG 24 hr capsule Take 1 capsule (37.5 mg total) by mouth daily with breakfast. For hot flashes 90 capsule 1  . vitamin B-12 (CYANOCOBALAMIN) 1000 MCG tablet Take 1 tablet (1,000 mcg total) by mouth daily. 30 tablet 11    Allergies as of 06/02/2022 - Review Complete 06/02/2022  Allergen Reaction Noted  . Losartan potassium  12/20/2012  . Levaquin [levofloxacin in d5w] Rash 02/06/2013  . Ace inhibitors Cough 01/30/2008  . Codeine Nausea Only 05/26/2015  . Cefepime Rash 09/05/2012  . Vancomycin Rash 09/05/2012     Review of Systems:    Constitutional: No fever or chills Skin: No rash or itching Cardiovascular: No chest pain Respiratory: No SOB  Gastrointestinal: See HPI and otherwise negative Genitourinary: No dysuria Neurological: No headache or syncope Musculoskeletal: No new muscle or joint pain Hematologic: No bruising Psychiatric: No history of depression or anxiety    Physical Exam:  Vital signs in last 24 hours: Temp:  [97.9 F (36.6 C)-98.6 F (37 C)] 98.6 F (37 C) (04/11 0630) Pulse Rate:  [73-102] 88 (04/11 0700) Resp:  [7-22] 17 (04/11 0700) BP: (77-125)/(54-85) 113/71 (04/11 0700) SpO2:  [93 %-100 %] 99 % (04/11 0739) Weight:  [54.4 kg] 54.4 kg  (04/10 1412)   General:   Pleasant AA female appears to be in NAD, Well developed, Well nourished, alert and cooperative Head:  Normocephalic and atraumatic. Eyes:   PEERL, EOMI. No icterus. Conjunctiva pink. Ears:  Normal auditory acuity. Neck:  Supple Throat: Oral cavity and pharynx without inflammation, swelling or lesion. Front mandibular teeth missing Lungs: Respirations even and unlabored. Lungs clear to auscultation bilaterally.   No wheezes, crackles, or rhonchi.  Heart: Normal S1, S2. No MRG. Regular rate and rhythm. No peripheral edema, cyanosis or pallor.  Abdomen:  Soft, nondistended, nontender. No rebound or guarding.  Normal bowel sounds. No appreciable masses or hepatomegaly. Rectal:  Not performed.  Msk:  Symmetrical without gross deformities. Peripheral pulses intact.  Extremities:  Without edema, no deformity or joint abnormality.  Neurologic:  Alert and  oriented x4;  grossly normal neurologically.  Skin:   Dry and intact without significant lesions or rashes. Psychiatric: Demonstrates good judgement and reason without abnormal affect or behaviors. Poor historian   LAB RESULTS: Recent Labs    06/02/22 2129 06/03/22 0209  WBC 4.3  --   HGB 8.6* 6.7*  HCT 25.6* 20.7*  PLT 153  --    BMET Recent Labs    06/02/22 2129  NA 136  K 3.7  CL 97*  CO2 26  GLUCOSE 107*  BUN 30*  CREATININE 1.38*  CALCIUM 8.5*   LFT Recent Labs    06/02/22 2129  PROT 6.4*  ALBUMIN 2.7*  AST 20  ALT 13  ALKPHOS 53  BILITOT 0.9   PT/INR Recent Labs    06/03/22 0209  LABPROT 14.7  INR 1.2    STUDIES: DG Chest 2 View  Result Date: 06/02/2022 CLINICAL DATA:  Shortness of breath EXAM: CHEST - 2 VIEW COMPARISON:  07/08/2016 FINDINGS: The heart size and mediastinal contours are within normal limits. Both lungs are clear. The visualized skeletal structures are unremarkable. IMPRESSION: No active cardiopulmonary disease. Electronically Signed   By: Alcide Clever M.D.   On:  06/02/2022 21:49    CTAP with contrast with no acute findings in the abdomen or pelvis, hepatic steatosis, aortic atherosclerosis and left-sided adrenal hyperplasia   Impression / Plan:   Impression: 1.  Upper GI bleed: With description of melena and hematemesis, hemoglobin 8.6--> 6.7 since admission now status post 2 units PRBCs, repeat hemoglobin pending, actively vomiting blood during time of interview, denies history of NSAID use but this was documented by previous providers as well as alcohol abuse, does admit to daily aspirin "for my heart", imaging with no sign of varices; consider likely bleeding ulcer 2.  Acute blood loss anemia: With above  Plan: 1.  Patient is scheduled for an emergent EGD this afternoon with Dr. [name].  Did discuss risks, benefits, limitations and alternatives and the patient agrees to proceed.  This is scheduled around 1215. 2.  Did ask the nurse to recheck hemoglobin when able after second unit PRBCs has completed 3.  Patient is n.p.o. and will remain so until after time of procedure 4.  Agree with as needed analgesics and antiemetics, may need to schedule these out if ongoing symptoms 5.  Agree with Pantoprazole 40 mg IV twice daily 6.  Continue to monitor hemoglobin and transfusion as needed less than 7 7.  Holding patient's home aspirin for now given GI bleed 8.  Please await further recommendations from Dr. [name] after time of procedure. 9.  Will give Reglan dose now to help clear stomach before procedure.  Thank you for your kind consultation, we will continue to follow.  Violet Baldy [facility]  06/03/2022, 9:06 AM

## 2022-06-03 NOTE — ED Provider Notes (Signed)
[place] EMERGENCY DEPARTMENT AT [facility] Provider Note   CSN: [id number] Arrival date & time: 06/02/22  2100     History  Chief Complaint  Patient presents with  . Nausea    Christina Byrd is a 57 y.o. female.  The history is provided by the patient.  Emesis Severity:  Severe Duration: months. Quality:  Bilious material and coffee grounds Progression:  Unchanged Chronicity:  New Context: not post-tussive   Relieved by:  Nothing Worsened by:  Nothing Associated symptoms: no fever   Risk factors: no travel to endemic areas   Rectal Bleeding Quality:  Black and tarry Amount:  Copious Duration: since 4/3. Timing:  Intermittent Chronicity:  New Context: not hemorrhoids   Similar prior episodes: no   Relieved by:  Nothing Worsened by:  Nothing Ineffective treatments:  None tried Associated symptoms: vomiting   Associated symptoms: no fever   Risk factors: no anticoagulant use and no hx of IBD   Patient with GERD and HTN has been seen multiple times for nausea and vomiting.  Reportedly went to PMD today for lightheadedness and continued symptoms and was hypotensive but reports was sent home and then went to urgent care where she was again hypotensive in the seventies and sent to the ED.  IS showing me pictures of melena in the toilet that started on 4/3 and bilious     Past Medical History:  Diagnosis Date  . GASTROESOPHAGEAL REFLUX, NO ESOPHAGITIS 04/21/2006   Qualifier: Diagnosis of  By: Levada Schilling    . GERD (gastroesophageal reflux disease) Dx 1995  . HTN (hypertension)      Home Medications Prior to Admission medications   Medication Sig Start Date End Date Taking? Authorizing Provider  allopurinol (ZYLOPRIM) 100 MG tablet Take 1 tablet (100 mg total) by mouth daily. 01/13/22   Grayce Sessions, NP  amLODipine (NORVASC) 10 MG tablet TAKE 1 TABLET(10 MG) BY MOUTH DAILY 05/22/22   Grayce Sessions, NP  aspirin EC 81 MG tablet Take 1  tablet (81 mg total) by mouth daily. 10/26/12   Rai, Ripudeep K, MD  atorvastatin (LIPITOR) 20 MG tablet TAKE 1 TABLET(20 MG) BY MOUTH DAILY 03/12/22   Grayce Sessions, NP  Blood Glucose Monitoring Suppl (TRUE METRIX METER) DEVI 1 kit by Does not apply route 3 (three) times daily. CHECK BLOOD SUGAR UP TO 3 TIMES DAILY. E11.9 02/27/19   Hoy Register, MD  clindamycin (CLEOCIN T) 1 % external solution Apply topically 2 (two) times daily.    [provider]  cloNIDine (CATAPRES) 0.1 MG tablet : TAKE 1 TABLET(0.1 MG) BY MOUTH AT BEDTIME AS NEEDED FOR HOT FLASHES 05/09/22   Grayce Sessions, NP  diclofenac (VOLTAREN) 75 MG EC tablet TAKE 1 TABLET(75 MG) BY MOUTH TWICE DAILY 02/01/22   Grayce Sessions, NP  fenofibrate (TRICOR) 48 MG tablet Take 1 tablet (48 mg total) by mouth daily. 04/28/22   Grayce Sessions, NP  fluticasone (FLONASE) 50 MCG/ACT nasal spray USE 2 SPRAYS IN EACH NOSTRIL DAILY 06/25/21   Hoy Register, MD  folic acid (FOLVITE) 1 MG tablet TAKE 1 TABLET(1 MG) BY MOUTH DAILY 04/29/22   Van Clines, MD  glucose blood (TRUE METRIX BLOOD GLUCOSE TEST) test strip Use as instructed 03/06/20   Hoy Register, MD  ketoconazole (NIZORAL) 2 % cream Apply 1 Application topically daily.    [provider]  levETIRAcetam (KEPPRA) 500 MG tablet Take 1 tablet (500 mg total) by  mouth 2 (two) times daily. 05/04/21   Van Clines, MD  LORazepam (ATIVAN) 0.5 MG tablet take 1 tab by mouth once 30 minutes before MRI 05/06/21   Hoy Register, MD  magnesium oxide (MAG-OX) 400 (240 Mg) MG tablet TAKE 1 TABLET BY MOUTH EVERY DAY 06/24/21   Hoy Register, MD  omega-3 acid ethyl esters (LOVAZA) 1 g capsule Take 2 capsules (2 g total) by mouth 2 (two) times daily. 10/06/21   Hoy Register, MD  ondansetron (ZOFRAN-ODT) 4 MG disintegrating tablet Take 1 tablet (4 mg total) by mouth every 8 (eight) hours as needed for nausea or vomiting. 05/29/22   Rancour, Jeannett Senior, MD  pantoprazole  (PROTONIX) 40 MG tablet TAKE 1 TABLET(40 MG) BY MOUTH DAILY 03/12/22   Grayce Sessions, NP  thiamine (VITAMIN B-1) 100 MG tablet Take 1 tablet (100 mg total) by mouth daily. 07/28/20   Van Clines, MD  tiZANidine (ZANAFLEX) 4 MG tablet TAKE 1 TABLET BY MOUTH EVERY 8 HOURS AS NEEDED FOR MUSCLE SPASMS 11/02/21   Hoy Register, MD  TRUEplus Lancets 28G MISC SMARTSIG:Topical 1 to 3 Times Daily 03/06/20   Hoy Register, MD  venlafaxine XR (EFFEXOR XR) 37.5 MG 24 hr capsule Take 1 capsule (37.5 mg total) by mouth daily with breakfast. For hot flashes 10/05/21   Hoy Register, MD  vitamin B-12 (CYANOCOBALAMIN) 1000 MCG tablet Take 1 tablet (1,000 mcg total) by mouth daily. 07/28/20   Van Clines, MD  metFORMIN (GLUCOPHAGE) 500 MG tablet Take 0.5 tablets (250 mg total) by mouth daily with breakfast. 03/05/20 04/02/20  Anders Simmonds, PA-C      Allergies    Losartan potassium, Levaquin [levofloxacin in d5w], Ace inhibitors, Codeine, Cefepime, and Vancomycin    Review of Systems   Review of Systems  Constitutional:  Negative for fever.  Gastrointestinal:  Positive for hematochezia and vomiting.    Physical Exam Updated Vital Signs BP 107/66   Pulse 83   Temp 97.9 F (36.6 C)   Resp (!) 7   LMP 08/23/2011   SpO2 99%  Physical Exam  ED Results / Procedures / Treatments   Labs (all labs ordered are listed, but only abnormal results are displayed) Results for orders placed or performed during the hospital encounter of 06/02/22  Comprehensive metabolic panel  Result Value Ref Range   Sodium 136 135 - 145 mmol/L   Potassium 3.7 3.5 - 5.1 mmol/L   Chloride 97 (L) 98 - 111 mmol/L   CO2 26 22 - 32 mmol/L   Glucose, Bld 107 (H) 70 - 99 mg/dL   BUN 30 (H) 6 - 20 mg/dL   Creatinine, Ser 7.68 (H) 0.44 - 1.00 mg/dL   Calcium 8.5 (L) 8.9 - 10.3 mg/dL   Total Protein 6.4 (L) 6.5 - 8.1 g/dL   Albumin 2.7 (L) 3.5 - 5.0 g/dL   AST 20 15 - 41 U/L   ALT 13 0 - 44 U/L   Alkaline Phosphatase  53 38 - 126 U/L   Total Bilirubin 0.9 0.3 - 1.2 mg/dL   GFR, Estimated 45 (L) >60 mL/min   Anion gap 13 5 - 15  CBC with Differential  Result Value Ref Range   WBC 4.3 4.0 - 10.5 K/uL   RBC 2.59 (L) 3.87 - 5.11 MIL/uL   Hemoglobin 8.6 (L) 12.0 - 15.0 g/dL   HCT 08.8 (L) 11.0 - 31.5 %   MCV 98.8 80.0 - 100.0 fL   MCH 33.2 26.0 -  34.0 pg   MCHC 33.6 30.0 - 36.0 g/dL   RDW 16.1 (H) 09.6 - 04.5 %   Platelets 153 150 - 400 K/uL   nRBC 0.0 0.0 - 0.2 %   Neutrophils Relative % 65 %   Neutro Abs 2.8 1.7 - 7.7 K/uL   Lymphocytes Relative 26 %   Lymphs Abs 1.1 0.7 - 4.0 K/uL   Monocytes Relative 7 %   Monocytes Absolute 0.3 0.1 - 1.0 K/uL   Eosinophils Relative 1 %   Eosinophils Absolute 0.0 0.0 - 0.5 K/uL   Basophils Relative 0 %   Basophils Absolute 0.0 0.0 - 0.1 K/uL   Immature Granulocytes 1 %   Abs Immature Granulocytes 0.02 0.00 - 0.07 K/uL  Lipase, blood  Result Value Ref Range   Lipase 153 (H) 11 - 51 U/L  Urinalysis, Routine w reflex microscopic -Urine, Clean Catch  Result Value Ref Range   Color, Urine YELLOW YELLOW   APPearance HAZY (A) CLEAR   Specific Gravity, Urine 1.019 1.005 - 1.030   pH 6.0 5.0 - 8.0   Glucose, UA NEGATIVE NEGATIVE mg/dL   Hgb urine dipstick NEGATIVE NEGATIVE   Bilirubin Urine NEGATIVE NEGATIVE   Ketones, ur 5 (A) NEGATIVE mg/dL   Protein, ur 30 (A) NEGATIVE mg/dL   Nitrite NEGATIVE NEGATIVE   Leukocytes,Ua MODERATE (A) NEGATIVE   RBC / HPF 0-5 0 - 5 RBC/hpf   WBC, UA 6-10 0 - 5 WBC/hpf   Bacteria, UA RARE (A) NONE SEEN   Squamous Epithelial / HPF 0-5 0 - 5 /HPF   Mucus PRESENT   Rapid urine drug screen (hospital performed)  Result Value Ref Range   Opiates NONE DETECTED NONE DETECTED   Cocaine NONE DETECTED NONE DETECTED   Benzodiazepines NONE DETECTED NONE DETECTED   Amphetamines NONE DETECTED NONE DETECTED   Tetrahydrocannabinol NONE DETECTED NONE DETECTED   Barbiturates NONE DETECTED NONE DETECTED  POC occult blood, ED Provider will  collect  Result Value Ref Range   Fecal Occult Bld POSITIVE (A) NEGATIVE  Troponin I (High Sensitivity)  Result Value Ref Range   Troponin I (High Sensitivity) 7 <18 ng/L   DG Chest 2 View  Result Date: 06/02/2022 CLINICAL DATA:  Shortness of breath EXAM: CHEST - 2 VIEW COMPARISON:  07/08/2016 FINDINGS: The heart size and mediastinal contours are within normal limits. Both lungs are clear. The visualized skeletal structures are unremarkable. IMPRESSION: No active cardiopulmonary disease. Electronically Signed   By: Alcide Clever M.D.   On: 06/02/2022 21:49   CT Soft Tissue Neck W Contrast  Result Date: 05/29/2022 CLINICAL DATA:  57 year old female with tooth extraction in February. Abnormal dentition, possible anterior maxillary alveolus infection/phlegmon on CT last month. EXAM: CT NECK WITH CONTRAST TECHNIQUE: Multidetector CT imaging of the neck was performed using the standard protocol following the bolus administration of intravenous contrast. RADIATION DOSE REDUCTION: This exam was performed according to the departmental dose-optimization program which includes automated exposure control, adjustment of the mA and/or kV according to patient size and/or use of iterative reconstruction technique. CONTRAST:  75mL OMNIPAQUE IOHEXOL 350 MG/ML SOLN COMPARISON:  Neck CT 05/09/2022. Plain head CT 09/06/2017. FINDINGS: Pharynx and larynx: Retropharyngeal course of both carotids, normal variant. Pharyngeal, parapharyngeal, retropharyngeal spaces within normal limits. Glottis is closed, larynx otherwise appears negative. Salivary glands: Negative sublingual space and submandibular glands. Negative parotid glands. See masticator space and dental findings below. Thyroid: Subcentimeter right thyroid nodule Not clinically significant; no follow-up imaging recommended (ref:  J Am Coll Radiol. 2015 Feb;12(2): 143-50). Lymph nodes: No cervical lymphadenopathy. Vascular: Major vascular structures in the neck and at the  skull base are patent with no significant atherosclerosis. Incidental retropharyngeal course of both carotids (normal variant). Limited intracranial: Chronic left MCA anterior division, anterior operculum region encephalomalacia on series 3, image 3 appears stable since 2019. No new abnormality of the visible brain. Visualized orbits: Negative. Mastoids and visualized paranasal sinuses: Neck Visualized paranasal sinuses and mastoids are stable and well aerated. Skeleton: Virtually absent dentition now. There is a single residual anterior right maxillary tooth with extensive but stable surrounding apical lucency. Central maxillary alveolus lucency also unchanged from last month. No new maxilla erosion. An regional soft tissues appear stable without obvious active inflammation. Masticator spaces appear normal. Widespread advanced cervical spine disc and endplate degeneration. Mandible intact and normally located. No new osseous abnormality. Upper chest: Negative aside from Calcified aortic atherosclerosis. No upper mediastinal lymphadenopathy. Lung apices are clear. IMPRESSION: 1. Subtotal absent dentition. Residual abnormal right anterior maxillary tooth with unchanged since last month surrounding and separate nearby midline maxillary alveolus bone resorption. No obvious regional soft tissue inflammation. 2. No acute or inflammatory process identified in the neck. 3.  Aortic Atherosclerosis (ICD10-I70.0). Electronically Signed   By: Odessa FlemingH  Hall M.D.   On: 05/29/2022 05:42   CT ABDOMEN PELVIS W CONTRAST  Result Date: 05/29/2022 CLINICAL DATA:  57 year old female with history of acute onset of nonlocalized abdominal pain. EXAM: CT ABDOMEN AND PELVIS WITH CONTRAST TECHNIQUE: Multidetector CT imaging of the abdomen and pelvis was performed using the standard protocol following bolus administration of intravenous contrast. RADIATION DOSE REDUCTION: This exam was performed according to the departmental dose-optimization  program which includes automated exposure control, adjustment of the mA and/or kV according to patient size and/or use of iterative reconstruction technique. CONTRAST:  75mL OMNIPAQUE IOHEXOL 350 MG/ML SOLN COMPARISON:  CT of the abdomen and pelvis 05/09/2022. FINDINGS: Lower chest: Unremarkable. Hepatobiliary: Diffuse low attenuation throughout the hepatic parenchyma, indicative of a background of hepatic steatosis. No discrete cystic or solid hepatic lesions are confidently identified on today's examination. No intra or extrahepatic biliary ductal dilatation. Gallbladder is unremarkable in appearance. Pancreas: No pancreatic mass. No pancreatic ductal dilatation. No pancreatic or peripancreatic fluid collections or inflammatory changes. Spleen: Unremarkable. Adrenals/Urinary Tract: Adrenal form thickening of the left adrenal gland, likely reflective of adrenal hyperplasia. Mild diffuse cortical thinning in both kidneys. Bilateral kidneys and right adrenal gland are otherwise unremarkable in appearance. No hydroureteronephrosis. Urinary bladder is normal in appearance. Stomach/Bowel: The appearance of the stomach is normal. There is no pathologic dilatation of small bowel or colon. Normal appendix. Vascular/Lymphatic: Atherosclerosis in the abdominal aorta and pelvic vasculature, without evidence of aneurysm or dissection. No lymphadenopathy noted in the abdomen or pelvis. Reproductive: Multiple small lesions in the uterus, largest of which extends exophytically from the posterior aspect of the uterine fundus measuring up to 2 cm in diameter, presumably small fibroids. Ovaries are atrophic and otherwise unremarkable in appearance. Other: No significant volume of ascites.  No pneumoperitoneum. Musculoskeletal: There are no aggressive appearing lytic or blastic lesions noted in the visualized portions of the skeleton. IMPRESSION: 1. No acute findings are noted in the abdomen or pelvis to account for the patient's  symptoms. 2. Hepatic steatosis. 3. Aortic atherosclerosis. 4. Left-sided adrenal hyperplasia. 5. Additional incidental findings, as above. Electronically Signed   By: Trudie Reedaniel  Entrikin M.D.   On: 05/29/2022 05:35   CT Soft Tissue Neck W Contrast  Result Date: 05/09/2022 CLINICAL DATA:  Soft tissue infection suspected, neck x-ray done. Additional history provided: Patient reports having teeth pulled on 02/06. Abdominal pain. Emesis. EXAM: CT NECK WITH CONTRAST TECHNIQUE: Multidetector CT imaging of the neck was performed using the standard protocol following the bolus administration of intravenous contrast. RADIATION DOSE REDUCTION: This exam was performed according to the departmental dose-optimization program which includes automated exposure control, adjustment of the mA and/or kV according to patient size and/or use of iterative reconstruction technique. CONTRAST:  100 mL Omnipaque 350 intravenous contrast. COMPARISON:  No pertinent prior exams available for comparison. FINDINGS: Pharynx and larynx: The patient is nearly edentulous. There is prominent periapical lucency surrounding a tooth remnant within the anterior right maxilla (for instance as seen on series 3, image 29). This likely reflects a periapical abscess. The periapical abscess appears to extend into the anterior aspect of the right nasal passage (series 1, image 23). Additionally, there is adjacent ill-defined soft tissue attenuation along the anterior aspect of the maxilla to the right, suspicious for phlegmon. 8 mm lucent focus within the anterior maxilla at midline, which may reflect incisive canal cyst or a residual cyst (series 3, image 32) (series 1, image 20). No appreciable swelling or mass elsewhere within the oral cavity, pharynx or larynx. Salivary glands: No inflammation, mass, or stone. Thyroid: Subcentimeter thyroid nodules not meeting consensus criteria for ultrasound follow-up based on size. No follow-up imaging is recommended.  Lymph nodes: No pathologically enlarged lymph nodes are identified within the neck. Vascular: The major vascular structures of the neck are patent. Limited intracranial: No evidence of an acute intracranial abnormality within the field of view. Partially empty sella turcica. Visualized orbits: No orbital mass or acute orbital finding at the imaged levels. Mastoids and visualized paranasal sinuses: Portions of the frontal sinuses are excluded from the field of view superiorly. No significant paranasal sinus disease at the imaged levels. Skeleton: Cervical spondylosis. Upper chest: No consolidation within the imaged lung apices. IMPRESSION: 1. Poor dentition. The patient is nearly edentulous. 2. Prominent periapical lucency surrounding a tooth remnant within the anterior right maxilla, likely reflecting a periapical abscess. The periapical abscess appears to extend into the anterior aspect of the right nasal passage. Additionally, there is adjacent ill-defined soft tissue attenuation along the right anterior aspect of the maxilla, suspicious for phlegmon. 3. 8 mm lucent focus within the anterior maxilla at midline, which may reflect an incisive canal cyst or a residual cyst. 4. Partially empty sella turcica. This finding can reflect incidental anatomic variation, or alternatively, it can be associated with idiopathic intracranial hypertension (pseudotumor cerebri). Electronically Signed   By: Jackey Loge D.O.   On: 05/09/2022 12:44   CT ABDOMEN PELVIS W CONTRAST  Result Date: 05/09/2022 CLINICAL DATA:  Recent dental extraction.  Acute abdominal pain EXAM: CT ABDOMEN AND PELVIS WITH CONTRAST TECHNIQUE: Multidetector CT imaging of the abdomen and pelvis was performed using the standard protocol following bolus administration of intravenous contrast. RADIATION DOSE REDUCTION: This exam was performed according to the departmental dose-optimization program which includes automated exposure control, adjustment of the  mA and/or kV according to patient size and/or use of iterative reconstruction technique. CONTRAST:  OMNIPAQUE IOHEXOL 350 MG/ML SOLN COMPARISON:  None Available. FINDINGS: Lower chest: Lung bases clear Hepatobiliary: No focal hepatic lesion. Normal gallbladder. No biliary duct dilatation. Common bile duct is normal. Pancreas: Pancreas is normal. No ductal dilatation. No pancreatic inflammation. Spleen: Normal spleen Adrenals/urinary tract: Adrenal glands are normal. Bilateral renal  cortical thinning. The ureters and bladder normal. Stomach/Bowel: Stomach, small bowel, appendix, and cecum are normal. The colon and rectosigmoid colon are normal. Vascular/Lymphatic: Abdominal aorta is normal caliber. No periportal or retroperitoneal adenopathy. No pelvic adenopathy. Reproductive: Uterus and adnexa unremarkable. Other: No free fluid. Musculoskeletal: No aggressive osseous lesion. IMPRESSION: 1. No acute findings in the abdomen pelvis. 2. Normal appendix. 3. Bilateral renal cortical thinning. Electronically Signed   By: Genevive Bi M.D.   On: 05/09/2022 12:34    EKG EKG Interpretation  Date/Time:  Wednesday Jackelyn Illingworth 10 2024 21:27:00 EDT Ventricular Rate:  87 PR Interval:  162 QRS Duration: 66 QT Interval:  372 QTC Calculation: 447 R Axis:   42 Text Interpretation: Normal sinus rhythm Confirmed by Nicanor Alcon, Barnie Sopko ([id number]) on 06/03/2022 12:17:02 AM  Radiology DG Chest 2 View  Result Date: 06/02/2022 CLINICAL DATA:  Shortness of breath EXAM: CHEST - 2 VIEW COMPARISON:  07/08/2016 FINDINGS: The heart size and mediastinal contours are within normal limits. Both lungs are clear. The visualized skeletal structures are unremarkable. IMPRESSION: No active cardiopulmonary disease. Electronically Signed   By: Alcide Clever M.D.   On: 06/02/2022 21:49    Procedures Procedures    Medications Ordered in ED Medications  pantoprozole (PROTONIX) 80 mg /NS 100 mL infusion (has no administration in time range)   pantoprazole (PROTONIX) injection 40 mg (has no administration in time range)  lactated ringers bolus 1,000 mL (0 mLs Intravenous Stopped 06/03/22 0047)  pantoprazole (PROTONIX) 80 mg /NS 100 mL IVPB (80 mg Intravenous New Bag/Given 06/03/22 0128)  sodium chloride 0.9 % bolus 1,000 mL (1,000 mLs Intravenous New Bag/Given 06/03/22 0131)    ED Course/ Medical Decision Making/ A&P                             Medical Decision Making GI bleeding since the 3rd and   Amount and/or Complexity of Data Reviewed Independent Historian: spouse    Details: See above  External Data Reviewed: labs, radiology and notes.    Details: Previous notes and labs and CT reviewed  Labs: ordered.    Details: All labs reviewed: uds negative urine is positive for UTI, elevated lipase 153 consistent with pancreatitis.  Positive hemoccult.  Normal white count 4.3, hemoglobin 8.6 low, normal platelets. Normal sodium 136, 3.7, elevated creatinine 1.38 Discussion of management or test interpretation with external provider(s): Dr. Rhea Belton secure chat by me for GIB Dr. Julian Reil will admit   Risk Prescription drug management. Decision regarding hospitalization. Risk Details: Blood ordered by me as hemoglobin dropped even further   Critical Care Total time providing critical care: 30 minutes   Final Clinical Impression(s) / ED Diagnoses Final diagnoses:  Gastrointestinal hemorrhage, unspecified gastrointestinal hemorrhage type  Nausea and vomiting, unspecified vomiting type  Hypotension, unspecified hypotension type   The patient appears reasonably stabilized for admission considering the current resources, flow, and capabilities available in the ED at this time, and I doubt any other [facility] requiring further screening and/or treatment in the ED prior to admission.  Rx / DC Orders ED Discharge Orders     None         Lonita Debes, MD 06/03/22 [phone]

## 2022-06-03 NOTE — ED Notes (Signed)
Critical hemoglobin 6.7, report @02 :64, Dr Nicanor Alcon updated via epic message

## 2022-06-03 NOTE — H&P (Signed)
History and Physical    Christina Byrd:[id number] DOB: 04-12-1965 DOA: 06/02/2022  PCP: Grayce Sessions, NP (Confirm with patient/family/NH records and if not entered, this has to be entered at [facility] point of entry) Patient coming from: Home  I have personally briefly reviewed patient's old medical records in [facility]  Chief Complaint: Abdominal pain, black tarry stool  HPI: Christina Byrd is a 57 y.o. female with medical history significant of alcohol abuse, GERD, HTN, presented with epigastric abdominal pain, nauseous vomiting and black stool.  Patient has been having on and off abdominal pain nauseous vomiting for 4+ weeks after she had 9 teeth removed.  Has had 2 ED visit before yesterday.  This week however her symptoms became worse, she described the abdominal pain as cramping-like epigastric located, episodic, and worsening with nauseous no vomiting.  Vomitus has been stomach content no blood non-coffee-ground stuff.  Since yesterday she has been passing black tarry stool 3 episodes, denies any tenesmus, denies any fever or chills.  No chest pain no shortness of breath.  At baseline, patient drinks 2 beers +4 liquor shots every day last drink was last night.  Recently for tooth ache patient also started to take OTC NSAIDs every day.  ED Course: Borderline tachycardia, borderline hypotension afebrile nonhypoxic.  Hemoglobin trending down in the ED 8.6 on admission then trending down to 6.7, and 2 unit of PRBC ordered.  FOBT positive, BUN 30, creatinine 1.2  PPI started in the ED  Review of Systems: As per HPI otherwise 14 point review of systems negative.    Past Medical History:  Diagnosis Date   GASTROESOPHAGEAL REFLUX, NO ESOPHAGITIS 04/21/2006   Qualifier: Diagnosis of  By: Levada Schilling     GERD (gastroesophageal reflux disease) Dx 1995   HTN (hypertension)     History reviewed. No pertinent surgical history.   reports that she quit smoking about 9 years  ago. Her smoking use included cigarettes. She has never used smokeless tobacco. She reports that she does not currently use alcohol after a past usage of about 49.0 standard drinks of alcohol per week. She reports that she does not use drugs.  Allergies  Allergen Reactions   Losartan Potassium     Rash   Levaquin [Levofloxacin In D5w] Rash   Ace Inhibitors Cough    REACTION: cough   Codeine Nausea Only   Cefepime Rash    09/04/12 pm Patient started to break out with small macules after IV Vanco infusion, then macules increased in size after starting cefepime infusion.   Vancomycin Rash    09/04/12 pm Patient started to break out with small macules after IV Vanco infusion, then macules increased in size after starting cefepime infusion.    Family History  Problem Relation Age of Onset   CAD Mother      Prior to Admission medications   Medication Sig Start Date End Date Taking? Authorizing Provider  allopurinol (ZYLOPRIM) 100 MG tablet Take 1 tablet (100 mg total) by mouth daily. 01/13/22   Grayce Sessions, NP  amLODipine (NORVASC) 10 MG tablet TAKE 1 TABLET(10 MG) BY MOUTH DAILY 05/22/22   Grayce Sessions, NP  aspirin EC 81 MG tablet Take 1 tablet (81 mg total) by mouth daily. 10/26/12   Rai, Ripudeep K, MD  atorvastatin (LIPITOR) 20 MG tablet TAKE 1 TABLET(20 MG) BY MOUTH DAILY 03/12/22   Grayce Sessions, NP  Blood Glucose Monitoring Suppl (TRUE METRIX METER) DEVI 1 kit  by Does not apply route 3 (three) times daily. CHECK BLOOD SUGAR UP TO 3 TIMES DAILY. E11.9 02/27/19   Hoy Register, MD  clindamycin (CLEOCIN T) 1 % external solution Apply topically 2 (two) times daily.    [provider]  cloNIDine (CATAPRES) 0.1 MG tablet : TAKE 1 TABLET(0.1 MG) BY MOUTH AT BEDTIME AS NEEDED FOR HOT FLASHES 05/09/22   Grayce Sessions, NP  diclofenac (VOLTAREN) 75 MG EC tablet TAKE 1 TABLET(75 MG) BY MOUTH TWICE DAILY 02/01/22   Grayce Sessions, NP  fenofibrate (TRICOR) 48 MG  tablet Take 1 tablet (48 mg total) by mouth daily. 04/28/22   Grayce Sessions, NP  fluticasone (FLONASE) 50 MCG/ACT nasal spray USE 2 SPRAYS IN EACH NOSTRIL DAILY 06/25/21   Hoy Register, MD  folic acid (FOLVITE) 1 MG tablet TAKE 1 TABLET(1 MG) BY MOUTH DAILY 04/29/22   Van Clines, MD  glucose blood (TRUE METRIX BLOOD GLUCOSE TEST) test strip Use as instructed 03/06/20   Hoy Register, MD  ketoconazole (NIZORAL) 2 % cream Apply 1 Application topically daily.    [provider]  levETIRAcetam (KEPPRA) 500 MG tablet Take 1 tablet (500 mg total) by mouth 2 (two) times daily. 05/04/21   Van Clines, MD  LORazepam (ATIVAN) 0.5 MG tablet take 1 tab by mouth once 30 minutes before MRI 05/06/21   Hoy Register, MD  magnesium oxide (MAG-OX) 400 (240 Mg) MG tablet TAKE 1 TABLET BY MOUTH EVERY DAY 06/24/21   Hoy Register, MD  omega-3 acid ethyl esters (LOVAZA) 1 g capsule Take 2 capsules (2 g total) by mouth 2 (two) times daily. 10/06/21   Hoy Register, MD  ondansetron (ZOFRAN-ODT) 4 MG disintegrating tablet Take 1 tablet (4 mg total) by mouth every 8 (eight) hours as needed for nausea or vomiting. 05/29/22   Rancour, Jeannett Senior, MD  pantoprazole (PROTONIX) 40 MG tablet TAKE 1 TABLET(40 MG) BY MOUTH DAILY 03/12/22   Grayce Sessions, NP  thiamine (VITAMIN B-1) 100 MG tablet Take 1 tablet (100 mg total) by mouth daily. 07/28/20   Van Clines, MD  tiZANidine (ZANAFLEX) 4 MG tablet TAKE 1 TABLET BY MOUTH EVERY 8 HOURS AS NEEDED FOR MUSCLE SPASMS 11/02/21   Hoy Register, MD  TRUEplus Lancets 28G MISC SMARTSIG:Topical 1 to 3 Times Daily 03/06/20   Hoy Register, MD  venlafaxine XR (EFFEXOR XR) 37.5 MG 24 hr capsule Take 1 capsule (37.5 mg total) by mouth daily with breakfast. For hot flashes 10/05/21   Hoy Register, MD  vitamin B-12 (CYANOCOBALAMIN) 1000 MCG tablet Take 1 tablet (1,000 mcg total) by mouth daily. 07/28/20   Van Clines, MD  metFORMIN (GLUCOPHAGE) 500 MG tablet Take 0.5  tablets (250 mg total) by mouth daily with breakfast. 03/05/20 04/02/20  Anders Simmonds, PA-C    Physical Exam: Vitals:   06/03/22 0645 06/03/22 0700 06/03/22 0739 06/03/22 1000  BP: 112/74 113/71  131/88  Pulse: 77 88  82  Resp: 12 17  13   Temp:    98.6 F (37 C)  TempSrc:    Oral  SpO2: 97% 97% 99%     Constitutional: NAD, calm, comfortable Vitals:   06/03/22 0645 06/03/22 0700 06/03/22 0739 06/03/22 1000  BP: 112/74 113/71  131/88  Pulse: 77 88  82  Resp: 12 17  13   Temp:    98.6 F (37 C)  TempSrc:    Oral  SpO2: 97% 97% 99%    Eyes: PERRL, lids  and conjunctivae normal ENMT: Mucous membranes are moist. Posterior pharynx clear of any exudate or lesions.Normal dentition.  Neck: normal, supple, no masses, no thyromegaly Respiratory: clear to auscultation bilaterally, no wheezing, no crackles. Normal respiratory effort. No accessory muscle use.  Cardiovascular: Regular rate and rhythm, no murmurs / rubs / gallops. No extremity edema. 2+ pedal pulses. No carotid bruits.  Abdomen: no tenderness, no masses palpated. No hepatosplenomegaly. Bowel sounds positive.  Musculoskeletal: no clubbing / cyanosis. No joint deformity upper and lower extremities. Good ROM, no contractures. Normal muscle tone.  Skin: no rashes, lesions, ulcers. No induration Neurologic: CN 2-12 grossly intact. Sensation intact, DTR normal. Strength 5/5 in all 4.  Psychiatric: Normal judgment and insight. Alert and oriented x 3. Normal mood.    Labs on Admission: I have personally reviewed following labs and imaging studies  CBC: Recent Labs  Lab 05/29/22 0048 06/02/22 2129 06/03/22 0209  WBC 7.3 4.3  --   NEUTROABS  --  2.8  --   HGB 11.7* 8.6* 6.7*  HCT 34.3* 25.6* 20.7*  MCV 94.0 98.8  --   PLT 177 153  --    Basic Metabolic Panel: Recent Labs  Lab 05/29/22 0048 06/02/22 2129  NA 137 136  K 3.3* 3.7  CL 93* 97*  CO2 24 26  GLUCOSE 98 107*  BUN 9 30*  CREATININE 1.23* 1.38*  CALCIUM  8.3* 8.5*   GFR: Estimated Creatinine Clearance: 36 mL/min (A) (by C-G formula based on SCr of 1.38 mg/dL (H)). Liver Function Tests: Recent Labs  Lab 05/29/22 0048 06/02/22 2129  AST 80* 20  ALT 28 13  ALKPHOS 82 53  BILITOT 1.3* 0.9  PROT 6.8 6.4*  ALBUMIN 2.9* 2.7*   Recent Labs  Lab 05/29/22 0048 06/02/22 2129  LIPASE 101* 153*   No results for input(s): "AMMONIA" in the last 168 hours. Coagulation Profile: Recent Labs  Lab 06/03/22 0209  INR 1.2   Cardiac Enzymes: No results for input(s): "CKTOTAL", "CKMB", "CKMBINDEX", "TROPONINI" in the last 168 hours. BNP (last 3 results) No results for input(s): "PROBNP" in the last 8760 hours. HbA1C: No results for input(s): "HGBA1C" in the last 72 hours. CBG: Recent Labs  Lab 06/02/22 1931  GLUCAP 97   Lipid Profile: No results for input(s): "CHOL", "HDL", "LDLCALC", "TRIG", "CHOLHDL", "LDLDIRECT" in the last 72 hours. Thyroid Function Tests: No results for input(s): "TSH", "T4TOTAL", "FREET4", "T3FREE", "THYROIDAB" in the last 72 hours. Anemia Panel: No results for input(s): "VITAMINB12", "FOLATE", "FERRITIN", "TIBC", "IRON", "RETICCTPCT" in the last 72 hours. Urine analysis:    Component Value Date/Time   COLORURINE YELLOW 06/03/2022 0017   APPEARANCEUR HAZY (A) 06/03/2022 0017   LABSPEC 1.019 06/03/2022 0017   PHURINE 6.0 06/03/2022 0017   GLUCOSEU NEGATIVE 06/03/2022 0017   HGBUR NEGATIVE 06/03/2022 0017   HGBUR trace-lysed 08/05/2006 1541   BILIRUBINUR NEGATIVE 06/03/2022 0017   BILIRUBINUR negative 06/20/2013 1652   KETONESUR 5 (A) 06/03/2022 0017   PROTEINUR 30 (A) 06/03/2022 0017   UROBILINOGEN 1.0 06/20/2013 1652   UROBILINOGEN 1.0 08/05/2006 1541   NITRITE NEGATIVE 06/03/2022 0017   LEUKOCYTESUR MODERATE (A) 06/03/2022 0017    Radiological Exams on Admission: DG Chest 2 View  Result Date: 06/02/2022 CLINICAL DATA:  Shortness of breath EXAM: CHEST - 2 VIEW COMPARISON:  07/08/2016 FINDINGS: The  heart size and mediastinal contours are within normal limits. Both lungs are clear. The visualized skeletal structures are unremarkable. IMPRESSION: No active cardiopulmonary disease. Electronically Signed  By: Alcide CleverMark  Lukens M.D.   On: 06/02/2022 21:49    EKG: Independently reviewed.  Sinus rhythm, no acute ST changes  Assessment/Plan Principal Problem:   GI bleed Active Problems:   Alcohol abuse  (please populate well all problems here in Problem List. (For example, if patient is on BP meds at home and you resume or decide to hold them, it is a problem that needs to be her. Same for CAD, COPD, HLD and so on)  Acute blood loss anemia -Likely secondary to GI bleed, suspect source is upper GI, suspect peptic ulcer as patient has risk factor of alcohol abuse as well as continuous NSAIDs use recently.  CT abdominal pelvis on April 6 showed no signs of cirrhosis and INR normal making portal hypertension less likely. -Other DDx, Mallory-Weiss also possible -With PRBC x 2 and continuous volume resuscitation -Discussed with [facility], EGD today.  N.p.o.  Alcohol abuse -No symptoms signs of acute withdrawal, start CIWA protocol with as needed benzos  HTN -Hold off home BP meds -As needed hydralazine  DVT prophylaxis: SCD Code Status: Full code Family Communication: None at bedside Disposition Plan: Patient is sick with ongoing GI bleed, expect inpatient GI workup, expect more than 2 midnight hospital stay. Consults called: [facility] Admission status: PCU   Emeline GeneralPing T Ciro Tashiro MD [facility] Pager [phone]  06/03/2022, 10:07 AM

## 2022-06-03 NOTE — Anesthesia Procedure Notes (Signed)
Procedure Name: MAC Date/Time: 06/03/2022 2:03 PM  Performed by: Jodell Cipro, CRNAPre-anesthesia Checklist: Patient identified, Emergency Drugs available, Suction available, Patient being monitored and Timeout performed Patient Re-evaluated:Patient Re-evaluated prior to induction Oxygen Delivery Method: Simple face mask Placement Confirmation: positive ETCO2 Dental Injury: Teeth and Oropharynx as per pre-operative assessment

## 2022-06-03 NOTE — Anesthesia Postprocedure Evaluation (Signed)
Anesthesia Post Note  Patient: Christina Byrd  Procedure(s) Performed: ESOPHAGOGASTRODUODENOSCOPY (EGD) WITH PROPOFOL HOT HEMOSTASIS (ARGON PLASMA COAGULATION/BICAP) BIOPSY     Patient location during evaluation: PACU Anesthesia Type: MAC Level of consciousness: awake and alert Pain management: pain level controlled Vital Signs Assessment: post-procedure vital signs reviewed and stable Respiratory status: spontaneous breathing, nonlabored ventilation and respiratory function stable Cardiovascular status: blood pressure returned to baseline Postop Assessment: no apparent nausea or vomiting Anesthetic complications: no   No notable events documented.  Last Vitals:  Vitals:   06/03/22 1450 06/03/22 1500  BP: 100/84 102/88  Pulse: 91 71  Resp: 18 16  Temp:    SpO2: 98% 98%    Last Pain:  Vitals:   06/03/22 1500  TempSrc:   PainSc: 0-No pain                 Shanda Howells

## 2022-06-03 NOTE — Transfer of Care (Signed)
Immediate Anesthesia Transfer of Care Note  Patient: Christina Byrd  Procedure(s) Performed: ESOPHAGOGASTRODUODENOSCOPY (EGD) WITH PROPOFOL HOT HEMOSTASIS (ARGON PLASMA COAGULATION/BICAP) BIOPSY  Patient Location: Endoscopy Unit  Anesthesia Type:MAC  Level of Consciousness: drowsy  Airway & Oxygen Therapy: Patient Spontanous Breathing and Patient connected to face mask oxygen  Post-op Assessment: Report given to RN and Post -op Vital signs reviewed and stable  Post vital signs: Reviewed and stable  Last Vitals:  Vitals Value Taken Time  BP    Temp    Pulse    Resp    SpO2      Last Pain:  Vitals:   06/03/22 1316  TempSrc: Oral  PainSc: 0-No pain         Complications: No notable events documented.

## 2022-06-04 ENCOUNTER — Telehealth: Payer: Self-pay

## 2022-06-04 DIAGNOSIS — K922 Gastrointestinal hemorrhage, unspecified: Secondary | ICD-10-CM | POA: Diagnosis not present

## 2022-06-04 DIAGNOSIS — F1010 Alcohol abuse, uncomplicated: Secondary | ICD-10-CM | POA: Diagnosis not present

## 2022-06-04 DIAGNOSIS — R634 Abnormal weight loss: Secondary | ICD-10-CM | POA: Diagnosis not present

## 2022-06-04 DIAGNOSIS — D62 Acute posthemorrhagic anemia: Secondary | ICD-10-CM | POA: Diagnosis not present

## 2022-06-04 DIAGNOSIS — R112 Nausea with vomiting, unspecified: Secondary | ICD-10-CM | POA: Diagnosis not present

## 2022-06-04 LAB — TYPE AND SCREEN
ABO/RH(D): B NEG
Antibody Screen: NEGATIVE
Unit division: 0

## 2022-06-04 LAB — BPAM RBC
Blood Product Expiration Date: 202404142359
Blood Product Expiration Date: 202405082359
ISSUE DATE / TIME: 202404110404
ISSUE DATE / TIME: 202404110404
Unit Type and Rh: 1700

## 2022-06-04 LAB — CBC
HCT: 24.4 % — ABNORMAL LOW (ref 36.0–46.0)
Hemoglobin: 8.6 g/dL — ABNORMAL LOW (ref 12.0–15.0)
MCH: 32.3 pg (ref 26.0–34.0)
MCHC: 35.2 g/dL (ref 30.0–36.0)
MCV: 91.7 fL (ref 80.0–100.0)
Platelets: 133 10*3/uL — ABNORMAL LOW (ref 150–400)
RBC: 2.66 MIL/uL — ABNORMAL LOW (ref 3.87–5.11)
RDW: 16.9 % — ABNORMAL HIGH (ref 11.5–15.5)
WBC: 6.2 10*3/uL (ref 4.0–10.5)
nRBC: 0 % (ref 0.0–0.2)

## 2022-06-04 LAB — BASIC METABOLIC PANEL
Anion gap: 11 (ref 5–15)
BUN: 20 mg/dL (ref 6–20)
CO2: 23 mmol/L (ref 22–32)
Calcium: 7.8 mg/dL — ABNORMAL LOW (ref 8.9–10.3)
Chloride: 105 mmol/L (ref 98–111)
Creatinine, Ser: 0.92 mg/dL (ref 0.44–1.00)
GFR, Estimated: >60 mL/min (ref >60)
Glucose, Bld: 62 mg/dL — ABNORMAL LOW (ref 70–99)
Potassium: 3 mmol/L — ABNORMAL LOW (ref 3.5–5.1)
Sodium: 139 mmol/L (ref 135–145)

## 2022-06-04 LAB — MAGNESIUM: Magnesium: 1.2 mg/dL — ABNORMAL LOW (ref 1.7–2.4)

## 2022-06-04 MED ORDER — FERROUS SULFATE 325 (65 FE) MG PO TABS
325.0000 mg | ORAL_TABLET | Freq: Two times a day (BID) | ORAL | Status: DC
  Administered 2022-06-04 – 2022-06-05 (×3): 325 mg via ORAL
  Filled 2022-06-04 (×3): qty 1

## 2022-06-04 MED ORDER — POTASSIUM CHLORIDE CRYS ER 20 MEQ PO TBCR
40.0000 meq | EXTENDED_RELEASE_TABLET | Freq: Four times a day (QID) | ORAL | Status: AC
  Administered 2022-06-04 (×2): 40 meq via ORAL
  Filled 2022-06-04 (×2): qty 2

## 2022-06-04 MED ORDER — MAGNESIUM SULFATE 4 GM/100ML IV SOLN
4.0000 g | Freq: Once | INTRAVENOUS | Status: AC
  Administered 2022-06-04: 4 g via INTRAVENOUS
  Filled 2022-06-04: qty 100

## 2022-06-04 NOTE — Care Management (Signed)
  Transition of Care [facility]) Screening Note   Patient Details  Name: BURNETTE ANTONE Date of Birth: 08/10/1965   Transition of Care [facility]) CM/SW Contact:    Gordy Clement, RN Phone Number: 06/04/2022, 10:16 AM    Transition of Care Department [facility]) has reviewed patient   [facility] consult for Substance Abuse screening placed   We will continue to monitor patient advancement through interdisciplinary progression rounds. If new patient transition needs arise, please place a TOC consult.

## 2022-06-04 NOTE — Telephone Encounter (Signed)
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 [name].  Thanks, JL L

## 2022-06-04 NOTE — Telephone Encounter (Signed)
4-week hospital follow up scheduled with Hyacinth Meeker, PA-C on Friday, 07/02/22 at 1:30 pm. Appt information will be included on discharge summary. Appt information mailed to address on file.

## 2022-06-04 NOTE — Progress Notes (Signed)
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 today for 8 weeks and Carafate 1 g 4 times daily for 2 weeks, told to stop Diclofenac  Today, patient is found sleeping in her room.  She really makes no effort to wake up with me talking to her.  Tells me she has no new complaints, no further vomiting but apparently had 1 further little bit of black stool overnight, no abdominal pain.   Objective   Vital signs in last 24 hours: Temp:  [98 F (36.7 C)-98.3 F (36.8 C)] 98 F (36.7 C) (04/12 0420) Pulse Rate:  [71-91] 77 (04/12 0420) Resp:  [11-18] 11 (04/12 0420) BP: (91-125)/(60-88) 91/60 (04/12 0420) SpO2:  [94 %-100 %] 99 % (04/12 0420) Weight:  [54.4 kg-56.8 kg] 56.8 kg (04/11 1513) Last BM Date : 06/02/22 General:    AA female in NAD Heart:  Regular rate and rhythm; no murmurs Lungs: Respirations even and unlabored, lungs CTA bilaterally Abdomen:  Soft, nontender and nondistended. Normal bowel sounds. Psych:  Sleepy  Intake/Output from previous day: 04/11 0701 - 04/12 0700 In: 715 [I.V.:400; Blood:315] Out: -    Lab Results: Recent Labs    06/02/22 2129 06/03/22 0209 06/03/22 1823 06/03/22 2141 06/04/22 0604  WBC 4.3  --  6.7  --  6.2  HGB 8.6*   < > 9.4* 9.5* 8.6*  HCT 25.6*   < > 26.7* 26.2* 24.4*  PLT 153  --  132*  --  133*   < > = values in this interval not displayed.   BMET Recent Labs    06/02/22 2129 06/03/22 1324 06/04/22 0604  NA 136 140 139  K 3.7 3.5 3.0*  CL 97* 106 105  CO2 26  --  23  GLUCOSE 107* 74 62*  BUN 30* 25* 20  CREATININE 1.38* 1.00 0.92  CALCIUM 8.5*  --  7.8*   LFT Recent Labs    06/02/22 2129  PROT 6.4*  ALBUMIN 2.7*  AST 20  ALT 13  ALKPHOS 53  BILITOT 0.9   PT/INR Recent  Labs    06/03/22 0209  LABPROT 14.7  INR 1.2    Studies/Results: DG Chest 2 View  Result Date: 06/02/2022 CLINICAL DATA:  Shortness of breath EXAM: CHEST - 2 VIEW COMPARISON:  07/08/2016 FINDINGS: The heart size and mediastinal contours are within normal limits. Both lungs are clear. The visualized skeletal structures are unremarkable. IMPRESSION: No active cardiopulmonary disease. Electronically Signed   By: Alcide Clever M.D.   On: 06/02/2022 21:49     Assessment / Plan:   Assessment: 1.  Upper GI bleed: EGD yesterday with APC of angioectasia in the stomach as well as nonbleeding duodenal ulcer, patient stable today 2.  Acute blood loss anemia: With above, hemoglobin stable today  Plan: 1.  Continue Pantoprazole 40 p.o. twice daily for 8 weeks 2.  Continue Carafate suspension 1 g 4 times daily for 2 weeks 3.  Again recommend discontinuation of Diclofenac which is likely the cause of the ulcer 4.  We will set up follow-up for the patient with myself or Dr. [name] in the next 3 to 4 weeks to discuss screening colonoscopy and to check on status of anemia.  Thank you for your kind consultation,  we will sign off.  Please call us back if we can be of any further assistance.   LOS: 1 day   Unk Lightning  06/04/2022, 11:41 AM

## 2022-06-04 NOTE — Evaluation (Signed)
Physical Therapy Evaluation Patient Details Name: Christina Byrd MRN: [id number] DOB: 22-Jan-1966 Today's Date: 06/04/2022  History of Present Illness  57 y/o F admitted to [facility] on 4/10 for epigastric abdominal pain, nauseous vomiting and black stool. 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 PMHx: alcohol abuse, GERD, HTN  Clinical Impression  Pt presents today functioning at or close to her mobility baseline. Pt reports being independent with all mobility at baseline, performing transfers with modI today and ambulating with supervision for line management and safety. Pt performing stair trial with minG and use of rail, attempted one trial without rail but required minA for controlled descent, educated on use of rails when first returning home and having her significant other there to help, verbalizing agreement. Pt reports no concerns with mobility upon discharge, educated on taking her time easing back into her normal activities. Pt with no further benefit from skilled acute PT with no current need for PT upon discharge, instructed pt to continue to mobilize during admission as able. PT will sign off.        Recommendations for follow up therapy are one component of a multi-disciplinary discharge planning process, led by the attending physician.  Recommendations may be updated based on patient status, additional functional criteria and insurance authorization.  Follow Up Recommendations       Assistance Recommended at Discharge Set up Supervision/Assistance  Patient can return home with the following  Help with stairs or ramp for entrance    Equipment Recommendations None recommended by PT  Recommendations for Other Services       Functional Status Assessment Patient has had a recent decline in their functional status and demonstrates the ability to make significant improvements in function in a reasonable and predictable amount of time.      Precautions / Restrictions Precautions Precautions: Fall Restrictions Weight Bearing Restrictions: No      Mobility  Bed Mobility               General bed mobility comments: pt seated EOB upon arrival    Transfers Overall transfer level: Modified independent Equipment used: None               General transfer comment: line management provided    Ambulation/Gait Ambulation/Gait assistance: Supervision Gait Distance (Feet): 200 Feet Assistive device: None Gait Pattern/deviations: Step-through pattern, Decreased stride length, Drifts right/left Gait velocity: decreased     General Gait Details: lateral sway noted but pt reports her typical gait pattern, no overt LOB  Stairs Stairs: Yes Stairs assistance: Min guard Stair Management: One rail Right, Step to pattern, Forwards Number of Stairs: 4 General stair comments: attempted one trial descending without rail but requiring minA, completeing minG with rail, assist for safety  Wheelchair Mobility    Modified Rankin (Stroke Patients Only)       Balance Overall balance assessment: Needs assistance Sitting-balance support: No upper extremity supported, Feet supported Sitting balance-Leahy Scale: Good     Standing balance support: No upper extremity supported, During functional activity Standing balance-Leahy Scale: Good Standing balance comment: mild lateral sway but no LOB                             Pertinent Vitals/Pain Pain Assessment Pain Assessment: No/denies pain    Home Living Family/patient expects to be discharged to:: Private residence Living Arrangements: Spouse/significant other Available Help at Discharge: Family;Available 24 hours/day  Type of Home: House Home Access: Stairs to enter Entrance Stairs-Rails: Right;Left;Can reach both Entrance Stairs-Number of Steps: 4   Home Layout: One level Home Equipment: Agricultural consultant (2 wheels);Crutches;Shower seat;Wheelchair -  manual Additional Comments: Has access to a lot of DME as they clean out SNF and get leftover DME patients do not take    Prior Function Prior Level of Function : Independent/Modified Independent;Working/employed;Driving             Mobility Comments: independent with all mobility, employed and drives, denies any recent falls       Hand Dominance   Dominant Hand: Right    Extremity/Trunk Assessment   Upper Extremity Assessment Upper Extremity Assessment: Overall WFL for tasks assessed    Lower Extremity Assessment Lower Extremity Assessment: Overall WFL for tasks assessed    Cervical / Trunk Assessment Cervical / Trunk Assessment: Normal  Communication   Communication: No difficulties  Cognition Arousal/Alertness: Awake/alert Behavior During Therapy: WFL for tasks assessed/performed Overall Cognitive Status: Within Functional Limits for tasks assessed                                 General Comments: A&Ox4, able to provide history, significant other at bedside without concerns for cognition        General Comments General comments (skin integrity, edema, etc.): HR elevating to 130-140 with stair trial but resolving with brief standing rest break    Exercises     Assessment/Plan    PT Assessment Patient does not need any further PT services  PT Problem List         PT Treatment Interventions      PT Goals (Current goals can be found in the Care Plan section)  Acute Rehab PT Goals Patient Stated Goal: go home PT Goal Formulation: All assessment and education complete, DC therapy    Frequency       Co-evaluation               AM-PAC PT "6 Clicks" Mobility  Outcome Measure Help needed turning from your back to your side while in a flat bed without using bedrails?: None Help needed moving from lying on your back to sitting on the side of a flat bed without using bedrails?: None Help needed moving to and from a bed to a chair  (including a wheelchair)?: None Help needed standing up from a chair using your arms (e.g., wheelchair or bedside chair)?: None Help needed to walk in hospital room?: A Little Help needed climbing 3-5 steps with a railing? : A Little 6 Click Score: 22    End of Session Equipment Utilized During Treatment: Gait belt Activity Tolerance: Patient tolerated treatment well Patient left: in bed;with call bell/phone within reach;with bed alarm set;with family/visitor present Nurse Communication: Mobility status PT Visit Diagnosis: Unsteadiness on feet (R26.81)    Time: 4128-2081 PT Time Calculation (min) (ACUTE ONLY): 13 min   Charges:   PT Evaluation $PT Eval Low Complexity: 1 Low          Lindalou Hose, PT DPT Acute Rehabilitation Services Office [phone]   Leonie Man 06/04/2022, 3:20 PM

## 2022-06-04 NOTE — Progress Notes (Signed)
PROGRESS NOTE                                                                                                                                                                                                             Patient Demographics:    Christina Byrd, is a 57 y.o. female, DOB - [birth year], RCB:[id number]  Outpatient Primary MD for the patient is Grayce Sessions, NP    LOS - 1  Admit date - 06/02/2022    Chief Complaint  Patient presents with   Nausea       Brief Narrative (HPI from H&P)   57 y.o. female with medical history significant of alcohol abuse, GERD, HTN, presented with epigastric abdominal pain, nauseous vomiting and black stool.  He was diagnosed with upper GI bleed and admitted to the hospital.   Subjective:    Christina Byrd today has, No headache, No chest pain, No abdominal pain - No Nausea, No new weakness tingling or numbness, no SOB, no further bleeding.   Assessment  & Plan :   Upper GI bleed with acute upper GI bleed related anemia in a patient with history of alcohol abuse and recent NSAID use.  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  Alcohol abuse.  Monitor on CIWA protocol counseled to quit.  No signs of DTs.  Hypertension.  As needed hydralazine for now.  Dyslipidemia.  Home dose statin.  History of gout.  On allopurinol.  Hypokalemia and hypomagnesemia.  Replaced.        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,  nonbleeding duodenal ulcer      Disposition Plan  :    Status is: Inpatient  DVT Prophylaxis  :    SCDs Start: 06/03/22 0801     Lab Results  Component Value Date   PLT 133 (  L) 06/04/2022    Diet :  Diet Order             DIET SOFT Fluid consistency: Thin  Diet effective now                    Inpatient Medications  Scheduled Meds:  allopurinol  100 mg Oral Daily   atorvastatin  20 mg Oral Daily   fenofibrate  54 mg Oral Daily   ferrous sulfate  325 mg Oral BID WC   folic acid  1 mg Oral Daily   levETIRAcetam  500 mg Oral BID   metoCLOPramide (REGLAN) injection  10 mg Intravenous Once   multivitamin with minerals  1 tablet Oral Daily   [START ON 06/06/2022] pantoprazole  40 mg Intravenous Q12H   potassium chloride  40 mEq Oral Q6H   sucralfate  1 g Oral TID WC & HS   thiamine  100 mg Oral Daily   Or   thiamine  100 mg Intravenous Daily   venlafaxine XR  37.5 mg Oral Q breakfast   Continuous Infusions:  magnesium sulfate bolus IVPB     PRN Meds:.hydrALAZINE, LORazepam **OR** LORazepam, ondansetron (ZOFRAN) IV, tiZANidine  Antibiotics  :    Anti-infectives (From admission, onward)    None         Objective:   Vitals:   06/03/22 1617 06/03/22 1943 06/04/22 0000 06/04/22 0420  BP:  108/64 125/80 91/60  Pulse:  79 80 77  Resp: Temp:  98.2 F (36.8 C) 98.3 F (36.8 C) 98 F (36.7 C)  TempSrc:  Oral Oral Oral  SpO2:  100% 99% 99%  Weight:      Height:        Wt Readings from Last 3 Encounters:  06/03/22 56.8 kg  06/02/22 54.4 kg  05/21/22 56.7 kg     Intake/Output Summary (Last 24 hours) at 06/04/2022 0804 Last data filed at 06/03/2022 1437 Gross per 24 hour  Intake 715 ml  Output --  Net 715 ml     Physical Exam  Awake Alert, No new F.N deficits, Normal affect [place].AT,PERRAL Supple Neck, No JVD,   Symmetrical Chest wall movement, Good air movement bilaterally, CTAB RRR,No Gallops,Rubs or new Murmurs,  +ve  B.Sounds, Abd Soft, No tenderness,   No Cyanosis, Clubbing or edema        Data Review:    Recent Labs  Lab 05/29/22 0048 06/02/22 2129 06/03/22 0209 06/03/22 1324 06/03/22 1823 06/03/22 2141 06/04/22 0604  WBC 7.3 4.3  --   --  6.7  --  6.2  HGB 11.7* 8.6* 6.7* 10.5* 9.4* 9.5* 8.6*  HCT 34.3* 25.6* 20.7* 31.0* 26.7* 26.2* 24.4*  PLT 177 153  --   --  132*  --  133*  MCV 94.0 98.8  --   --  92.4  --  91.7  MCH 32.1 33.2  --   --  32.5  --  32.3  MCHC 34.1 33.6  --   --  35.2  --  35.2  RDW 17.4* 17.1*  --   --  17.0*  --  16.9*  LYMPHSABS  --  1.1  --   --   --   --   --   MONOABS  --  0.3  --   --   --   --   --   EOSABS  --  0.0  --   --   --   --   --  BASOSABS  --  0.0  --   --   --   --   --     Recent Labs  Lab 05/29/22 0048 06/02/22 2129 06/03/22 0209 06/03/22 1324 06/04/22 0604  NA 137 136  --  140 139  K 3.3* 3.7  --  3.5 3.0*  CL 93* 97*  --  106 105  CO2 24 26  --   --  23  ANIONGAP 20* 13  --   --  11  GLUCOSE 98 107*  --  74 62*  BUN 9 30*  --  25* 20  CREATININE 1.23* 1.38*  --  1.00 0.92  AST 80* 20  --   --   --   ALT 28 13  --   --   --   ALKPHOS 82 53  --   --   --   BILITOT 1.3* 0.9  --   --   --   ALBUMIN 2.9* 2.7*  --   --   --   INR  --   --  1.2  --   --   MG  --   --   --   --  1.2*  CALCIUM 8.3* 8.5*  --   --  7.8*      Recent Labs  Lab 05/29/22 0048 06/02/22 2129 06/03/22 0209 06/04/22 0604  INR  --   --  1.2  --   MG  --   --   --  1.2*  CALCIUM 8.3* 8.5*  --  7.8*    Recent Labs  Lab 05/29/22 0048 06/02/22 2129 06/03/22 1324 06/03/22 1823 06/04/22 0604  WBC 7.3 4.3  --  6.7 6.2  PLT 177 153  --  132* 133*  CREATININE 1.23* 1.38* 1.00  --  0.92    ------------------------------------------------------------------------------------------------------------------ Lab Results  Component Value Date   CHOL 154 04/27/2022   HDL 87 04/27/2022   LDLCALC 48 04/27/2022   LDLDIRECT 153 (H) 01/30/2008   TRIG 109  04/27/2022   CHOLHDL 1.8 04/27/2022    Lab Results  Component Value Date   HGBA1C 4.6 (L) 04/27/2022    No results for input(s): "TSH", "T4TOTAL", "T3FREE", "THYROIDAB" in the last 72 hours.  Invalid input(s): "FREET3" ------------------------------------------------------------------------------------------------------------------ Cardiac Enzymes No results for input(s): "CKMB", "TROPONINI", "MYOGLOBIN" in the last 168 hours.  Invalid input(s): "CK"  Micro Results No results found for this or any previous visit (from the past 240 hour(s)).  Radiology Reports DG Chest 2 View  Result Date: 06/02/2022 CLINICAL DATA:  Shortness of breath EXAM: CHEST - 2 VIEW COMPARISON:  07/08/2016 FINDINGS: The heart size and mediastinal contours are within normal limits. Both lungs are clear. The visualized skeletal structures are unremarkable. IMPRESSION: No active cardiopulmonary disease. Electronically Signed   By: Alcide Clever M.D.   On: 06/02/2022 21:49      Signature  -   Susa Raring M.D on 06/04/2022 at 8:04 AM   -  To page go to [email or web address]

## 2022-06-05 DIAGNOSIS — K922 Gastrointestinal hemorrhage, unspecified: Secondary | ICD-10-CM | POA: Diagnosis not present

## 2022-06-05 LAB — BASIC METABOLIC PANEL
Anion gap: 10 (ref 5–15)
BUN: 21 mg/dL — ABNORMAL HIGH (ref 6–20)
CO2: 24 mmol/L (ref 22–32)
Calcium: 8.3 mg/dL — ABNORMAL LOW (ref 8.9–10.3)
Chloride: 103 mmol/L (ref 98–111)
Creatinine, Ser: 0.96 mg/dL (ref 0.44–1.00)
GFR, Estimated: >60 mL/min (ref >60)
Glucose, Bld: 129 mg/dL — ABNORMAL HIGH (ref 70–99)
Potassium: 3.8 mmol/L (ref 3.5–5.1)
Sodium: 137 mmol/L (ref 135–145)

## 2022-06-05 LAB — BRAIN NATRIURETIC PEPTIDE: B Natriuretic Peptide: 42.9 pg/mL (ref 0.0–100.0)

## 2022-06-05 LAB — CBC WITH DIFFERENTIAL/PLATELET
Abs Immature Granulocytes: 0.01 10*3/uL (ref 0.00–0.07)
Basophils Absolute: 0 10*3/uL (ref 0.0–0.1)
Basophils Relative: 0 %
Eosinophils Absolute: 0.1 10*3/uL (ref 0.0–0.5)
Eosinophils Relative: 1 %
HCT: 23.7 % — ABNORMAL LOW (ref 36.0–46.0)
Hemoglobin: 8.5 g/dL — ABNORMAL LOW (ref 12.0–15.0)
Immature Granulocytes: 0 %
Lymphocytes Relative: 26 %
Lymphs Abs: 1.5 10*3/uL (ref 0.7–4.0)
MCH: 33.1 pg (ref 26.0–34.0)
MCHC: 35.9 g/dL (ref 30.0–36.0)
MCV: 92.2 fL (ref 80.0–100.0)
Monocytes Absolute: 0.5 10*3/uL (ref 0.1–1.0)
Monocytes Relative: 8 %
Neutro Abs: 4 10*3/uL (ref 1.7–7.7)
Neutrophils Relative %: 65 %
Platelets: 152 10*3/uL (ref 150–400)
RBC: 2.57 MIL/uL — ABNORMAL LOW (ref 3.87–5.11)
RDW: 16.5 % — ABNORMAL HIGH (ref 11.5–15.5)
WBC: 6 10*3/uL (ref 4.0–10.5)
nRBC: 0 % (ref 0.0–0.2)

## 2022-06-05 LAB — MAGNESIUM: Magnesium: 2.1 mg/dL (ref 1.7–2.4)

## 2022-06-05 MED ORDER — FOLIC ACID 1 MG PO TABS: 1.0000 mg | ORAL_TABLET | Freq: Every day | ORAL | 0 refills | Status: DC

## 2022-06-05 MED ORDER — FERROUS SULFATE 325 (65 FE) MG PO TABS: 325.0000 mg | ORAL_TABLET | Freq: Two times a day (BID) | ORAL | 0 refills | Status: DC

## 2022-06-05 MED ORDER — ACETAMINOPHEN 325 MG PO TABS: 650.0000 mg | ORAL_TABLET | Freq: Four times a day (QID) | ORAL | Status: DC | PRN

## 2022-06-05 MED ORDER — PANTOPRAZOLE SODIUM 40 MG PO TBEC
40.0000 mg | DELAYED_RELEASE_TABLET | Freq: Two times a day (BID) | ORAL | 2 refills | Status: DC
Start: 2022-06-05 — End: 2022-10-21

## 2022-06-05 NOTE — Discharge Instructions (Addendum)
Follow with Primary MD Grayce Sessions, NP in 7 days, you must follow-up with the recommended GI physician within the next 1 to 2 weeks, resume aspirin once cleared by him to do so.  Get CBC, CMP, magnesium, anemia panel-  checked next visit with your primary MD    Activity: As tolerated with Full fall precautions use walker/cane & assistance as needed  Disposition Home    Diet: Heart Healthy    Special Instructions: If you have smoked or chewed Tobacco  in the last 2 yrs please stop smoking, stop any regular Alcohol  and or any Recreational drug use.  On your next visit with your primary care physician please Get Medicines reviewed and adjusted.  Please request your Prim.MD to go over all Hospital Tests and Procedure/Radiological results at the follow up, please get all Hospital records sent to your Prim MD by signing hospital release before you go home.  If you experience worsening of your admission symptoms, develop shortness of breath, life threatening emergency, suicidal or homicidal thoughts you must seek medical attention immediately by calling 911 or calling your MD immediately  if symptoms less severe.  You Must read complete instructions/literature along with all the possible adverse reactions/side effects for all the Medicines you take and that have been prescribed to you. Take any new Medicines after you have completely understood and accpet all the possible adverse reactions/side effects.   Do not drive when taking Pain medications.  Do not take more than prescribed Pain, Sleep and Anxiety Medications

## 2022-06-05 NOTE — Discharge Summary (Signed)
Christina Byrd KPQ:[id number] DOB: May 09, 1965 DOA: 06/02/2022  PCP: Grayce Sessions, NP  Admit date: 06/02/2022  Discharge date: 06/05/2022  Admitted From: Home   Disposition:  Home   Recommendations for Outpatient Follow-up:   Follow up with PCP in 1-2 weeks  PCP Please obtain BMP/CBC, 2 view CXR in 1week,  (see Discharge instructions)   PCP Please follow up on the following pending results: Monitor CBC, CMP, Magnesium, anemia panel closely.  Needs close outpatient follow-up with GI.   Home Health: None   Equipment/Devices: None  Consultations: GI Discharge Condition: Stable     CODE STATUS: Full    Diet Recommendation: Heart Healthy Low Carb  Diet Order             Diet - low sodium heart healthy           DIET SOFT Fluid consistency: Thin  Diet effective now                    Chief Complaint  Patient presents with   Nausea     Brief history of present illness from the day of admission and additional interim summary    57 y.o. female with medical history significant of alcohol abuse, GERD, HTN, presented with epigastric abdominal pain, nauseous vomiting and black stool.  He was diagnosed with upper GI bleed and admitted to the hospital.                                                                  Hospital Course   Upper GI bleed with acute upper GI bleed related anemia in a patient with history of alcohol abuse and recent NSAID use.  So far H&H stable, on IV PPI, did not require transfusion but will be monitored, placed on oral iron and folic acid, seen by GI underwent EGD showing clotted blood in the gastric fundus, single bleeding angioectasia in the stomach, nonbleeding duodenal ulcer, advance diet to soft, now stable no signs of active bleeding, placed on PPI twice daily, home  dose of NSAID and aspirin held, provided 1 month supply of folic acid and ferrous sulfate.  To follow-up with PCP and GI.  Requested to follow-up within 1 to 2 weeks of discharge.   Alcohol abuse.  Monitor on CIWA protocol counseled to quit.  No signs of DTs.   Hypertension.  As needed hydralazine for now.   Dyslipidemia.  Home dose statin.   History of gout.  On allopurinol.   Hypokalemia and hypomagnesemia.  Replaced.  PCP to recheck 7 to 10 days.    Discharge diagnosis     Principal Problem:   GI bleed Active Problems:   Alcohol abuse   Duodenal ulcer hemorrhage   AVM (arteriovenous malformation) of stomach, acquired with hemorrhage  Acute blood loss anemia    Discharge instructions    Discharge Instructions     Diet - low sodium heart healthy   Complete by: As directed    Discharge instructions   Complete by: As directed    Follow with Primary MD Grayce Sessions, NP in 7 days, you must follow-up with the recommended GI physician within the next 1 to 2 weeks, resume aspirin once cleared by him to do so.  Get CBC, CMP, magnesium, anemia panel-  checked next visit with your primary MD    Activity: As tolerated with Full fall precautions use walker/cane & assistance as needed  Disposition Home    Diet: Heart Healthy    Special Instructions: If you have smoked or chewed Tobacco  in the last 2 yrs please stop smoking, stop any regular Alcohol  and or any Recreational drug use.  On your next visit with your primary care physician please Get Medicines reviewed and adjusted.  Please request your Prim.MD to go over all Hospital Tests and Procedure/Radiological results at the follow up, please get all Hospital records sent to your Prim MD by signing hospital release before you go home.  If you experience worsening of your admission symptoms, develop shortness of breath, life threatening emergency, suicidal or homicidal thoughts you must seek medical attention  immediately by calling 911 or calling your MD immediately  if symptoms less severe.  You Must read complete instructions/literature along with all the possible adverse reactions/side effects for all the Medicines you take and that have been prescribed to you. Take any new Medicines after you have completely understood and accpet all the possible adverse reactions/side effects.   Do not drive when taking Pain medications.  Do not take more than prescribed Pain, Sleep and Anxiety Medications   Increase activity slowly   Complete by: As directed        Discharge Medications   Allergies as of 06/05/2022       Reactions   Losartan Potassium    Rash   Levaquin [levofloxacin In D5w] Rash   Ace Inhibitors Cough   REACTION: cough   Codeine Nausea Only   Cefepime Rash   09/04/12 pm Patient started to break out with small macules after IV Vanco infusion, then macules increased in size after starting cefepime infusion.   Vancomycin Rash   09/04/12 pm Patient started to break out with small macules after IV Vanco infusion, then macules increased in size after starting cefepime infusion.        Medication List     STOP taking these medications    aspirin EC 81 MG tablet   cloNIDine 0.1 MG tablet Commonly known as: CATAPRES   diclofenac 75 MG EC tablet Commonly known as: VOLTAREN   LORazepam 0.5 MG tablet Commonly known as: ATIVAN       TAKE these medications    allopurinol 100 MG tablet Commonly known as: ZYLOPRIM Take 1 tablet (100 mg total) by mouth daily.   amLODipine 10 MG tablet Commonly known as: NORVASC TAKE 1 TABLET(10 MG) BY MOUTH DAILY   atorvastatin 20 MG tablet Commonly known as: LIPITOR TAKE 1 TABLET(20 MG) BY MOUTH DAILY   clindamycin 1 % external solution Commonly known as: CLEOCIN T Apply topically 2 (two) times daily.   cyanocobalamin 1000 MCG tablet Commonly known as: VITAMIN B12 Take 1 tablet (1,000 mcg total) by mouth daily.   fenofibrate 48  MG tablet Commonly known as: Tricor Take 1 tablet (48  mg total) by mouth daily.   ferrous sulfate 325 (65 FE) MG tablet Take 1 tablet (325 mg total) by mouth 2 (two) times daily with a meal.   fluticasone 50 MCG/ACT nasal spray Commonly known as: FLONASE USE 2 SPRAYS IN EACH NOSTRIL DAILY   folic acid 1 MG tablet Commonly known as: FOLVITE Take 1 tablet (1 mg total) by mouth daily. What changed: See the new instructions.   ketoconazole 2 % cream Commonly known as: NIZORAL Apply 1 Application topically daily.   levETIRAcetam 500 MG tablet Commonly known as: KEPPRA Take 1 tablet (500 mg total) by mouth 2 (two) times daily.   magnesium oxide 400 (240 Mg) MG tablet Commonly known as: MAG-OX TAKE 1 TABLET BY MOUTH EVERY DAY   omega-3 acid ethyl esters 1 g capsule Commonly known as: LOVAZA Take 2 capsules (2 g total) by mouth 2 (two) times daily.   ondansetron 4 MG disintegrating tablet Commonly known as: ZOFRAN-ODT Take 1 tablet (4 mg total) by mouth every 8 (eight) hours as needed for nausea or vomiting.   pantoprazole 40 MG tablet Commonly known as: PROTONIX Take 1 tablet (40 mg total) by mouth 2 (two) times daily before a meal. What changed: See the new instructions.   thiamine 100 MG tablet Commonly known as: Vitamin B-1 Take 1 tablet (100 mg total) by mouth daily.   tiZANidine 4 MG tablet Commonly known as: ZANAFLEX TAKE 1 TABLET BY MOUTH EVERY 8 HOURS AS NEEDED FOR MUSCLE SPASMS   True Metrix Blood Glucose Test test strip Generic drug: glucose blood Use as instructed   True Metrix Meter Devi 1 kit by Does not apply route 3 (three) times daily. CHECK BLOOD SUGAR UP TO 3 TIMES DAILY. E11.9   TRUEplus Lancets 28G Misc SMARTSIG:Topical 1 to 3 Times Daily   venlafaxine XR 37.5 MG 24 hr capsule Commonly known as: Effexor XR Take 1 capsule (37.5 mg total) by mouth daily with breakfast. For hot flashes         Follow-up Information     Grayce Sessions, NP. Schedule an appointment as soon as possible for a visit in 1 week(s).   Specialty: Internal Medicine Contact information: [address] [place] [address] [phone]         Jenel Lucks, MD. Schedule an appointment as soon as possible for a visit in 1 week(s).   Specialty: Gastroenterology Contact information: [address] [place] [address] [phone]                 Major procedures and Radiology Reports - PLEASE review detailed and final reports thoroughly  -      DG Chest 2 View  Result Date: 06/02/2022 CLINICAL DATA:  Shortness of breath EXAM: CHEST - 2 VIEW COMPARISON:  07/08/2016 FINDINGS: The heart size and mediastinal contours are within normal limits. Both lungs are clear. The visualized skeletal structures are unremarkable. IMPRESSION: No active cardiopulmonary disease. Electronically Signed   By: Alcide Clever M.D.   On: 06/02/2022 21:49   CT Soft Tissue Neck W Contrast  Result Date: 05/29/2022 CLINICAL DATA:  57 year old female with tooth extraction in February. Abnormal dentition, possible anterior maxillary alveolus infection/phlegmon on CT last month. EXAM: CT NECK WITH CONTRAST TECHNIQUE: Multidetector CT imaging of the neck was performed using the standard protocol following the bolus administration of intravenous contrast. RADIATION DOSE REDUCTION: This exam was performed according to the departmental dose-optimization program which includes automated exposure control, adjustment  of the mA and/or kV according to patient size and/or use of iterative reconstruction technique. CONTRAST:  75mL OMNIPAQUE IOHEXOL 350 MG/ML SOLN COMPARISON:  Neck CT 05/09/2022. Plain head CT 09/06/2017. FINDINGS: Pharynx and larynx: Retropharyngeal course of both carotids, normal variant. Pharyngeal, parapharyngeal, retropharyngeal spaces within normal limits. Glottis is closed, larynx otherwise appears negative. Salivary glands: Negative sublingual  space and submandibular glands. Negative parotid glands. See masticator space and dental findings below. Thyroid: Subcentimeter right thyroid nodule Not clinically significant; no follow-up imaging recommended (ref: J Am Coll Radiol. 2015 Feb;12(2): 143-50). Lymph nodes: No cervical lymphadenopathy. Vascular: Major vascular structures in the neck and at the skull base are patent with no significant atherosclerosis. Incidental retropharyngeal course of both carotids (normal variant). Limited intracranial: Chronic left MCA anterior division, anterior operculum region encephalomalacia on series 3, image 3 appears stable since 2019. No new abnormality of the visible brain. Visualized orbits: Negative. Mastoids and visualized paranasal sinuses: Neck Visualized paranasal sinuses and mastoids are stable and well aerated. Skeleton: Virtually absent dentition now. There is a single residual anterior right maxillary tooth with extensive but stable surrounding apical lucency. Central maxillary alveolus lucency also unchanged from last month. No new maxilla erosion. An regional soft tissues appear stable without obvious active inflammation. Masticator spaces appear normal. Widespread advanced cervical spine disc and endplate degeneration. Mandible intact and normally located. No new osseous abnormality. Upper chest: Negative aside from Calcified aortic atherosclerosis. No upper mediastinal lymphadenopathy. Lung apices are clear. IMPRESSION: 1. Subtotal absent dentition. Residual abnormal right anterior maxillary tooth with unchanged since last month surrounding and separate nearby midline maxillary alveolus bone resorption. No obvious regional soft tissue inflammation. 2. No acute or inflammatory process identified in the neck. 3.  Aortic Atherosclerosis (ICD10-I70.0). Electronically Signed   By: Odessa Fleming M.D.   On: 05/29/2022 05:42   CT ABDOMEN PELVIS W CONTRAST  Result Date: 05/29/2022 CLINICAL DATA:  57 year old female  with history of acute onset of nonlocalized abdominal pain. EXAM: CT ABDOMEN AND PELVIS WITH CONTRAST TECHNIQUE: Multidetector CT imaging of the abdomen and pelvis was performed using the standard protocol following bolus administration of intravenous contrast. RADIATION DOSE REDUCTION: This exam was performed according to the departmental dose-optimization program which includes automated exposure control, adjustment of the mA and/or kV according to patient size and/or use of iterative reconstruction technique. CONTRAST:  75mL OMNIPAQUE IOHEXOL 350 MG/ML SOLN COMPARISON:  CT of the abdomen and pelvis 05/09/2022. FINDINGS: Lower chest: Unremarkable. Hepatobiliary: Diffuse low attenuation throughout the hepatic parenchyma, indicative of a background of hepatic steatosis. No discrete cystic or solid hepatic lesions are confidently identified on today's examination. No intra or extrahepatic biliary ductal dilatation. Gallbladder is unremarkable in appearance. Pancreas: No pancreatic mass. No pancreatic ductal dilatation. No pancreatic or peripancreatic fluid collections or inflammatory changes. Spleen: Unremarkable. Adrenals/Urinary Tract: Adrenal form thickening of the left adrenal gland, likely reflective of adrenal hyperplasia. Mild diffuse cortical thinning in both kidneys. Bilateral kidneys and right adrenal gland are otherwise unremarkable in appearance. No hydroureteronephrosis. Urinary bladder is normal in appearance. Stomach/Bowel: The appearance of the stomach is normal. There is no pathologic dilatation of small bowel or colon. Normal appendix. Vascular/Lymphatic: Atherosclerosis in the abdominal aorta and pelvic vasculature, without evidence of aneurysm or dissection. No lymphadenopathy noted in the abdomen or pelvis. Reproductive: Multiple small lesions in the uterus, largest of which extends exophytically from the posterior aspect of the uterine fundus measuring up to 2 cm in diameter, presumably small  fibroids. Ovaries are atrophic  and otherwise unremarkable in appearance. Other: No significant volume of ascites.  No pneumoperitoneum. Musculoskeletal: There are no aggressive appearing lytic or blastic lesions noted in the visualized portions of the skeleton. IMPRESSION: 1. No acute findings are noted in the abdomen or pelvis to account for the patient's symptoms. 2. Hepatic steatosis. 3. Aortic atherosclerosis. 4. Left-sided adrenal hyperplasia. 5. Additional incidental findings, as above. Electronically Signed   By: Trudie Reed M.D.   On: 05/29/2022 05:35   CT Soft Tissue Neck W Contrast  Result Date: 05/09/2022 CLINICAL DATA:  Soft tissue infection suspected, neck x-ray done. Additional history provided: Patient reports having teeth pulled on 02/06. Abdominal pain. Emesis. EXAM: CT NECK WITH CONTRAST TECHNIQUE: Multidetector CT imaging of the neck was performed using the standard protocol following the bolus administration of intravenous contrast. RADIATION DOSE REDUCTION: This exam was performed according to the departmental dose-optimization program which includes automated exposure control, adjustment of the mA and/or kV according to patient size and/or use of iterative reconstruction technique. CONTRAST:  100 mL Omnipaque 350 intravenous contrast. COMPARISON:  No pertinent prior exams available for comparison. FINDINGS: Pharynx and larynx: The patient is nearly edentulous. There is prominent periapical lucency surrounding a tooth remnant within the anterior right maxilla (for instance as seen on series 3, image 29). This likely reflects a periapical abscess. The periapical abscess appears to extend into the anterior aspect of the right nasal passage (series 1, image 23). Additionally, there is adjacent ill-defined soft tissue attenuation along the anterior aspect of the maxilla to the right, suspicious for phlegmon. 8 mm lucent focus within the anterior maxilla at midline, which may reflect  incisive canal cyst or a residual cyst (series 3, image 32) (series 1, image 20). No appreciable swelling or mass elsewhere within the oral cavity, pharynx or larynx. Salivary glands: No inflammation, mass, or stone. Thyroid: Subcentimeter thyroid nodules not meeting consensus criteria for ultrasound follow-up based on size. No follow-up imaging is recommended. Lymph nodes: No pathologically enlarged lymph nodes are identified within the neck. Vascular: The major vascular structures of the neck are patent. Limited intracranial: No evidence of an acute intracranial abnormality within the field of view. Partially empty sella turcica. Visualized orbits: No orbital mass or acute orbital finding at the imaged levels. Mastoids and visualized paranasal sinuses: Portions of the frontal sinuses are excluded from the field of view superiorly. No significant paranasal sinus disease at the imaged levels. Skeleton: Cervical spondylosis. Upper chest: No consolidation within the imaged lung apices. IMPRESSION: 1. Poor dentition. The patient is nearly edentulous. 2. Prominent periapical lucency surrounding a tooth remnant within the anterior right maxilla, likely reflecting a periapical abscess. The periapical abscess appears to extend into the anterior aspect of the right nasal passage. Additionally, there is adjacent ill-defined soft tissue attenuation along the right anterior aspect of the maxilla, suspicious for phlegmon. 3. 8 mm lucent focus within the anterior maxilla at midline, which may reflect an incisive canal cyst or a residual cyst. 4. Partially empty sella turcica. This finding can reflect incidental anatomic variation, or alternatively, it can be associated with idiopathic intracranial hypertension (pseudotumor cerebri). Electronically Signed   By: Jackey Loge D.O.   On: 05/09/2022 12:44   CT ABDOMEN PELVIS W CONTRAST  Result Date: 05/09/2022 CLINICAL DATA:  Recent dental extraction.  Acute abdominal pain EXAM:  CT ABDOMEN AND PELVIS WITH CONTRAST TECHNIQUE: Multidetector CT imaging of the abdomen and pelvis was performed using the standard protocol following bolus administration of intravenous contrast.  RADIATION DOSE REDUCTION: This exam was performed according to the departmental dose-optimization program which includes automated exposure control, adjustment of the mA and/or kV according to patient size and/or use of iterative reconstruction technique. CONTRAST:  OMNIPAQUE IOHEXOL 350 MG/ML SOLN COMPARISON:  None Available. FINDINGS: Lower chest: Lung bases clear Hepatobiliary: No focal hepatic lesion. Normal gallbladder. No biliary duct dilatation. Common bile duct is normal. Pancreas: Pancreas is normal. No ductal dilatation. No pancreatic inflammation. Spleen: Normal spleen Adrenals/urinary tract: Adrenal glands are normal. Bilateral renal cortical thinning. The ureters and bladder normal. Stomach/Bowel: Stomach, small bowel, appendix, and cecum are normal. The colon and rectosigmoid colon are normal. Vascular/Lymphatic: Abdominal aorta is normal caliber. No periportal or retroperitoneal adenopathy. No pelvic adenopathy. Reproductive: Uterus and adnexa unremarkable. Other: No free fluid. Musculoskeletal: No aggressive osseous lesion. IMPRESSION: 1. No acute findings in the abdomen pelvis. 2. Normal appendix. 3. Bilateral renal cortical thinning. Electronically Signed   By: Genevive Bi M.D.   On: 05/09/2022 12:34    Micro Results    No results found for this or any previous visit (from the past 240 hour(s)).  Today   Subjective    Christina Byrd today has no headache,no chest abdominal pain,no new weakness tingling or numbness, feels much better wants to go home today.    Objective   Blood pressure 93/81, pulse 84, temperature 97.8 F (36.6 C), temperature source Oral, resp. rate 11, height 5\' 2"  (1.575 m), weight 56.8 kg, last menstrual period 08/23/2011, SpO2 100 %.  No intake or  output data in the 24 hours ending 06/05/22 0902  Exam  Awake Alert, No new F.N deficits,    Muddy.AT,PERRAL Supple Neck,   Symmetrical Chest wall movement, Good air movement bilaterally, CTAB RRR,No Gallops,   +ve B.Sounds, Abd Soft, Non tender,  No Cyanosis, Clubbing or edema    Data Review   Recent Labs  Lab 06/02/22 2129 06/03/22 0209 06/03/22 1324 06/03/22 1823 06/03/22 2141 06/04/22 0604 06/05/22 0249  WBC 4.3  --   --  6.7  --  6.2 6.0  HGB 8.6*   < > 10.5* 9.4* 9.5* 8.6* 8.5*  HCT 25.6*   < > 31.0* 26.7* 26.2* 24.4* 23.7*  PLT 153  --   --  132*  --  133* 152  MCV 98.8  --   --  92.4  --  91.7 92.2  MCH 33.2  --   --  32.5  --  32.3 33.1  MCHC 33.6  --   --  35.2  --  35.2 35.9  RDW 17.1*  --   --  17.0*  --  16.9* 16.5*  LYMPHSABS 1.1  --   --   --   --   --  1.5  MONOABS 0.3  --   --   --   --   --  0.5  EOSABS 0.0  --   --   --   --   --  0.1  BASOSABS 0.0  --   --   --   --   --  0.0   < > = values in this interval not displayed.    Recent Labs  Lab 06/02/22 2129 06/03/22 0209 06/03/22 1324 06/04/22 0604 06/05/22 0249  NA 136  --  140 139 137  K 3.7  --  3.5 3.0* 3.8  CL 97*  --  106 105 103  CO2 26  --   --  23 24  ANIONGAP 13  --   --  11 10  GLUCOSE 107*  --  74 62* 129*  BUN 30*  --  25* 20 21*  CREATININE 1.38*  --  1.00 0.92 0.96  AST 20  --   --   --   --   ALT 13  --   --   --   --   ALKPHOS 53  --   --   --   --   BILITOT 0.9  --   --   --   --   ALBUMIN 2.7*  --   --   --   --   INR  --  1.2  --   --   --   BNP  --   --   --   --  42.9  MG  --   --   --  1.2* 2.1  CALCIUM 8.5*  --   --  7.8* 8.3*    Total Time in preparing paper work, data evaluation and todays exam - 35 minutes  Signature  -    Susa Raring M.D on 06/05/2022 at 9:02 AM   -  To page go to [email or web address]

## 2022-06-05 NOTE — Plan of Care (Signed)

## 2022-06-06 ENCOUNTER — Encounter (HOSPITAL_COMMUNITY): Payer: Self-pay | Admitting: Gastroenterology

## 2022-06-06 NOTE — Progress Notes (Signed)
Patient became upset trying to make writer think her dark stools came from infection from mouth. She has a f/u with GI . Tried to explain black stool with blood is old. Got mad and left

## 2022-06-07 ENCOUNTER — Telehealth: Payer: Self-pay

## 2022-06-07 NOTE — Telephone Encounter (Signed)
From the discharge call:  She said she is feeling much better since returning home from the hospital    We reviewed each medication on her med list/AVS.  She is waiting to get the refill of amlodipine at her pharmacy.  She does not have vitamin B12, lovaza, thiamine. She said she has never used clindamycin soln, flonase, ketaconazole cream.  She never had a glucometer. She needs a refill of effexor and said she needs something for hat flashes during the day and night. She has tizanidine but has not needed it.  She said she will bring all of her medications with her when she sees Marcelino Duster 06/09/2022   She said she is meeting with Marcelino Duster on 06/09/2022 to talk to her, it is not a follow up, she said she has her follow up with GI on 07/02/2022.

## 2022-06-07 NOTE — Transitions of Care (Post Inpatient/ED Visit) (Signed)
   06/07/2022  Name: Christina Byrd MRN: [id number] DOB: 1965-05-22  Today's TOC FU Call Status: Today's TOC FU Call Status:: Successful TOC FU Call Competed Unsuccessful Call (1st Attempt) Date: 06/07/22 [facility] FU Call Complete Date: 06/07/22  Transition Care Management Follow-up Telephone Call Date of Discharge: 06/05/22 Discharge Facility: [facility]) Type of Discharge: Inpatient Admission Primary Inpatient Discharge Diagnosis:: GI bleed How have you been since you were released from the hospital?: Better Any questions or concerns?: Yes Patient Questions/Concerns:: questions regarding medications as noted below.  She needs a refill of effexor and said she needs something for hat flashes during the day and night. She has tizanidine but has not needed it.  She said she is meeting with Marcelino Duster to talk to her, it is not a follow up, she said she has her follow up with GI. Patient Questions/Concerns Addressed: Notified Provider of Patient Questions/Concerns  Items Reviewed: Did you receive and understand the discharge instructions provided?: Yes Medications obtained and verified?: Yes (Medications Reviewed) (we reviewed each medication on her med list/AVS.  She is waiting to get the refill of amlodipine. She does not have vitamin B12, lovaza, thiamine. She said she has never used clindamycin soln, flonase, ketaconazole cream.  She never had a glucometer.) Any new allergies since your discharge?: No Dietary orders reviewed?: Yes Type of Diet Ordered:: heart healthy Do you have support at home?: Yes People in Home: spouse Name of Support/Comfort Primary Source: she hasa son and mulitple grandchildren who live close by.  Home Care and Equipment/Supplies: Were Home Health Services Ordered?: No Any new equipment or medical supplies ordered?: No  Functional Questionnaire: Do you need assistance with bathing/showering or dressing?: No Do you need assistance with meal preparation?:  No Do you need assistance with eating?: No Do you have difficulty maintaining continence: No Do you need assistance with getting out of bed/getting out of a chair/moving?: No Do you have difficulty managing or taking your medications?: No  Follow up appointments reviewed: PCP Follow-up appointment confirmed?: Yes Date of PCP follow-up appointment?: 06/09/22 Follow-up Provider: Gwinda Passe, NP Specialist Hospital Follow-up appointment confirmed?: Yes Date of Specialist follow-up appointment?: 07/02/22 Follow-Up Specialty Provider:: GI Do you need transportation to your follow-up appointment?: No Do you understand care options if your condition(s) worsen?: Yes-patient verbalized understanding    SIGNATURE  Robyne Peers, RN

## 2022-06-07 NOTE — Transitions of Care (Post Inpatient/ED Visit) (Signed)
   06/07/2022  Name: Christina Byrd MRN: [id number] DOB: Sep 22, 1965  Today's TOC FU Call Status: Today's TOC FU Call Status:: Unsuccessul Call (1st Attempt) Unsuccessful Call (1st Attempt) Date: 06/07/22  Attempted to reach the patient regarding the most recent Inpatient/ED visit.  Follow Up Plan: Additional outreach attempts will be made to reach the patient to complete the Transitions of Care (Post Inpatient/ED visit) call.   Signature Robyne Peers, RN

## 2022-06-08 ENCOUNTER — Telehealth: Payer: Self-pay

## 2022-06-08 NOTE — Telephone Encounter (Signed)
Copied from CRM [id number]. Topic: General - Other >> Jun 07, 2022  5:18 PM Ja-Kwan M wrote: Reason for CRM: Pt stated she was told that someone would call her back but no one ever called back.

## 2022-06-09 ENCOUNTER — Ambulatory Visit (INDEPENDENT_AMBULATORY_CARE_PROVIDER_SITE_OTHER): Payer: BLUE CROSS/BLUE SHIELD | Admitting: Primary Care

## 2022-06-11 LAB — SURGICAL PATHOLOGY

## 2022-06-14 ENCOUNTER — Encounter: Payer: Self-pay | Admitting: Gastroenterology

## 2022-06-15 ENCOUNTER — Encounter (INDEPENDENT_AMBULATORY_CARE_PROVIDER_SITE_OTHER): Payer: BLUE CROSS/BLUE SHIELD | Admitting: Primary Care

## 2022-06-15 ENCOUNTER — Ambulatory Visit (INDEPENDENT_AMBULATORY_CARE_PROVIDER_SITE_OTHER): Payer: BLUE CROSS/BLUE SHIELD | Admitting: Primary Care

## 2022-06-16 ENCOUNTER — Other Ambulatory Visit: Payer: Self-pay | Admitting: Neurology

## 2022-06-16 DIAGNOSIS — R569 Unspecified convulsions: Secondary | ICD-10-CM

## 2022-06-17 NOTE — Telephone Encounter (Signed)
Called to make appt with patient to see follow up with Karel Jarvis and could not leave a message or no answer. She will need a follow up appt schedule so Medication can be called in

## 2022-06-19 NOTE — Progress Notes (Signed)
Patient left.

## 2022-06-25 ENCOUNTER — Emergency Department (HOSPITAL_COMMUNITY): Payer: BLUE CROSS/BLUE SHIELD

## 2022-06-25 ENCOUNTER — Other Ambulatory Visit: Payer: Self-pay

## 2022-06-25 ENCOUNTER — Encounter (HOSPITAL_COMMUNITY): Payer: Self-pay

## 2022-06-25 ENCOUNTER — Inpatient Hospital Stay (HOSPITAL_COMMUNITY)
Admission: EM | Admit: 2022-06-25 | Discharge: 2022-06-29 | DRG: 101 | Disposition: A | Discharge status: Home / Self Care | Payer: BLUE CROSS/BLUE SHIELD | Attending: Internal Medicine | Admitting: Internal Medicine

## 2022-06-25 DIAGNOSIS — K922 Gastrointestinal hemorrhage, unspecified: Secondary | ICD-10-CM | POA: Diagnosis present

## 2022-06-25 DIAGNOSIS — E8809 Other disorders of plasma-protein metabolism, not elsewhere classified: Secondary | ICD-10-CM | POA: Diagnosis present

## 2022-06-25 DIAGNOSIS — Z8249 Family history of ischemic heart disease and other diseases of the circulatory system: Secondary | ICD-10-CM | POA: Diagnosis not present

## 2022-06-25 DIAGNOSIS — F1029 Alcohol dependence with unspecified alcohol-induced disorder: Secondary | ICD-10-CM | POA: Diagnosis not present

## 2022-06-25 DIAGNOSIS — E876 Hypokalemia: Secondary | ICD-10-CM | POA: Diagnosis present

## 2022-06-25 DIAGNOSIS — F1010 Alcohol abuse, uncomplicated: Secondary | ICD-10-CM | POA: Diagnosis present

## 2022-06-25 DIAGNOSIS — F1090 Alcohol use, unspecified, uncomplicated: Secondary | ICD-10-CM | POA: Diagnosis present

## 2022-06-25 DIAGNOSIS — Z87891 Personal history of nicotine dependence: Secondary | ICD-10-CM

## 2022-06-25 DIAGNOSIS — D509 Iron deficiency anemia, unspecified: Secondary | ICD-10-CM | POA: Diagnosis present

## 2022-06-25 DIAGNOSIS — K31811 Angiodysplasia of stomach and duodenum with bleeding: Secondary | ICD-10-CM | POA: Diagnosis present

## 2022-06-25 DIAGNOSIS — D500 Iron deficiency anemia secondary to blood loss (chronic): Secondary | ICD-10-CM | POA: Diagnosis not present

## 2022-06-25 DIAGNOSIS — E872 Acidosis, unspecified: Secondary | ICD-10-CM | POA: Diagnosis present

## 2022-06-25 DIAGNOSIS — N951 Menopausal and female climacteric states: Secondary | ICD-10-CM | POA: Diagnosis present

## 2022-06-25 DIAGNOSIS — Z881 Allergy status to other antibiotic agents status: Secondary | ICD-10-CM | POA: Diagnosis not present

## 2022-06-25 DIAGNOSIS — Z79899 Other long term (current) drug therapy: Secondary | ICD-10-CM | POA: Diagnosis not present

## 2022-06-25 DIAGNOSIS — F1020 Alcohol dependence, uncomplicated: Secondary | ICD-10-CM | POA: Diagnosis present

## 2022-06-25 DIAGNOSIS — Y901 Blood alcohol level of 20-39 mg/100 ml: Secondary | ICD-10-CM | POA: Diagnosis present

## 2022-06-25 DIAGNOSIS — G40909 Epilepsy, unspecified, not intractable, without status epilepticus: Secondary | ICD-10-CM | POA: Diagnosis present

## 2022-06-25 DIAGNOSIS — Z885 Allergy status to narcotic agent status: Secondary | ICD-10-CM | POA: Diagnosis not present

## 2022-06-25 DIAGNOSIS — Z7984 Long term (current) use of oral hypoglycemic drugs: Secondary | ICD-10-CM | POA: Diagnosis not present

## 2022-06-25 DIAGNOSIS — K31819 Angiodysplasia of stomach and duodenum without bleeding: Secondary | ICD-10-CM | POA: Diagnosis present

## 2022-06-25 DIAGNOSIS — K219 Gastro-esophageal reflux disease without esophagitis: Secondary | ICD-10-CM | POA: Diagnosis present

## 2022-06-25 DIAGNOSIS — N1832 Chronic kidney disease, stage 3b: Secondary | ICD-10-CM | POA: Diagnosis present

## 2022-06-25 DIAGNOSIS — Z888 Allergy status to other drugs, medicaments and biological substances status: Secondary | ICD-10-CM | POA: Diagnosis not present

## 2022-06-25 DIAGNOSIS — E785 Hyperlipidemia, unspecified: Secondary | ICD-10-CM | POA: Diagnosis present

## 2022-06-25 DIAGNOSIS — F10931 Alcohol use, unspecified with withdrawal delirium: Secondary | ICD-10-CM | POA: Diagnosis not present

## 2022-06-25 DIAGNOSIS — D631 Anemia in chronic kidney disease: Secondary | ICD-10-CM | POA: Diagnosis present

## 2022-06-25 DIAGNOSIS — I10 Essential (primary) hypertension: Secondary | ICD-10-CM | POA: Diagnosis present

## 2022-06-25 DIAGNOSIS — F10239 Alcohol dependence with withdrawal, unspecified: Secondary | ICD-10-CM | POA: Diagnosis not present

## 2022-06-25 DIAGNOSIS — R569 Unspecified convulsions: Principal | ICD-10-CM

## 2022-06-25 DIAGNOSIS — Z91148 Patient's other noncompliance with medication regimen for other reason: Secondary | ICD-10-CM | POA: Diagnosis not present

## 2022-06-25 DIAGNOSIS — E1122 Type 2 diabetes mellitus with diabetic chronic kidney disease: Secondary | ICD-10-CM | POA: Diagnosis present

## 2022-06-25 DIAGNOSIS — I129 Hypertensive chronic kidney disease with stage 1 through stage 4 chronic kidney disease, or unspecified chronic kidney disease: Secondary | ICD-10-CM | POA: Diagnosis present

## 2022-06-25 HISTORY — DX: Unspecified convulsions: R56.9

## 2022-06-25 LAB — CBC WITH DIFFERENTIAL/PLATELET
Abs Immature Granulocytes: 0 10*3/uL (ref 0.00–0.07)
Basophils Absolute: 0.1 10*3/uL (ref 0.0–0.1)
Basophils Relative: 1 %
Eosinophils Absolute: 0 10*3/uL (ref 0.0–0.5)
Eosinophils Relative: 0 %
HCT: 27.8 % — ABNORMAL LOW (ref 36.0–46.0)
Hemoglobin: 8.5 g/dL — ABNORMAL LOW (ref 12.0–15.0)
Lymphocytes Relative: 51 %
Lymphs Abs: 5.5 10*3/uL — ABNORMAL HIGH (ref 0.7–4.0)
MCH: 31 pg (ref 26.0–34.0)
MCHC: 30.6 g/dL (ref 30.0–36.0)
MCV: 101.5 fL — ABNORMAL HIGH (ref 80.0–100.0)
Monocytes Absolute: 0.4 10*3/uL (ref 0.1–1.0)
Monocytes Relative: 4 %
Neutro Abs: 4.7 10*3/uL (ref 1.7–7.7)
Neutrophils Relative %: 44 %
Platelets: 365 10*3/uL (ref 150–400)
RBC: 2.74 MIL/uL — ABNORMAL LOW (ref 3.87–5.11)
RDW: 16.1 % — ABNORMAL HIGH (ref 11.5–15.5)
WBC: 10.7 10*3/uL — ABNORMAL HIGH (ref 4.0–10.5)
nRBC: 0.2 % (ref 0.0–0.2)
nRBC: 1 /100 WBC — ABNORMAL HIGH

## 2022-06-25 LAB — COMPREHENSIVE METABOLIC PANEL
ALT: 29 U/L (ref 0–44)
AST: 59 U/L — ABNORMAL HIGH (ref 15–41)
Albumin: 3.1 g/dL — ABNORMAL LOW (ref 3.5–5.0)
Alkaline Phosphatase: 80 U/L (ref 38–126)
BUN: 5 mg/dL — ABNORMAL LOW (ref 6–20)
CO2: <7 mmol/L — ABNORMAL LOW (ref 22–32)
Calcium: 8.3 mg/dL — ABNORMAL LOW (ref 8.9–10.3)
Chloride: 108 mmol/L (ref 98–111)
Creatinine, Ser: 1.29 mg/dL — ABNORMAL HIGH (ref 0.44–1.00)
GFR, Estimated: 49 mL/min — ABNORMAL LOW (ref >60)
Glucose, Bld: 91 mg/dL (ref 70–99)
Potassium: 3.5 mmol/L (ref 3.5–5.1)
Sodium: 141 mmol/L (ref 135–145)
Total Bilirubin: 0.3 mg/dL (ref 0.3–1.2)
Total Protein: 6.8 g/dL (ref 6.5–8.1)

## 2022-06-25 LAB — CK: Total CK: 84 U/L (ref 38–234)

## 2022-06-25 LAB — CBG MONITORING, ED: Glucose-Capillary: 71 mg/dL (ref 70–99)

## 2022-06-25 LAB — MAGNESIUM: Magnesium: 1.5 mg/dL — ABNORMAL LOW (ref 1.7–2.4)

## 2022-06-25 LAB — ETHANOL: Alcohol, Ethyl (B): 24 mg/dL — ABNORMAL HIGH (ref <10)

## 2022-06-25 MED ORDER — ATORVASTATIN CALCIUM 10 MG PO TABS
20.0000 mg | ORAL_TABLET | Freq: Every day | ORAL | Status: DC
  Administered 2022-06-26 – 2022-06-29 (×4): 20 mg via ORAL
  Filled 2022-06-25 (×4): qty 2

## 2022-06-25 MED ORDER — POLYETHYLENE GLYCOL 3350 17 G PO PACK: 17.0000 g | PACK | Freq: Every day | ORAL | Status: DC | PRN

## 2022-06-25 MED ORDER — SODIUM CHLORIDE 0.9 % IV BOLUS
1000.0000 mL | Freq: Once | INTRAVENOUS | Status: AC
  Administered 2022-06-25: 1000 mL via INTRAVENOUS

## 2022-06-25 MED ORDER — THIAMINE HCL 100 MG/ML IJ SOLN
100.0000 mg | Freq: Every day | INTRAMUSCULAR | Status: DC
  Filled 2022-06-25 (×2): qty 2

## 2022-06-25 MED ORDER — OMEGA-3-ACID ETHYL ESTERS 1 G PO CAPS
2.0000 g | ORAL_CAPSULE | Freq: Two times a day (BID) | ORAL | Status: DC
  Administered 2022-06-25 – 2022-06-27 (×4): 2 g via ORAL
  Filled 2022-06-25 (×7): qty 2

## 2022-06-25 MED ORDER — VENLAFAXINE HCL ER 37.5 MG PO CP24
37.5000 mg | ORAL_CAPSULE | Freq: Every day | ORAL | Status: DC
  Administered 2022-06-26 – 2022-06-29 (×4): 37.5 mg via ORAL
  Filled 2022-06-25 (×4): qty 1

## 2022-06-25 MED ORDER — MAGNESIUM SULFATE 2 GM/50ML IV SOLN
2.0000 g | Freq: Once | INTRAVENOUS | Status: AC
  Administered 2022-06-25: 2 g via INTRAVENOUS
  Filled 2022-06-25: qty 50

## 2022-06-25 MED ORDER — ADULT MULTIVITAMIN W/MINERALS CH
1.0000 | ORAL_TABLET | Freq: Every day | ORAL | Status: DC
  Administered 2022-06-26 – 2022-06-29 (×4): 1 via ORAL
  Filled 2022-06-25 (×4): qty 1

## 2022-06-25 MED ORDER — THIAMINE MONONITRATE 100 MG PO TABS
100.0000 mg | ORAL_TABLET | Freq: Every day | ORAL | Status: DC
  Administered 2022-06-25 – 2022-06-29 (×5): 100 mg via ORAL
  Filled 2022-06-25 (×5): qty 1

## 2022-06-25 MED ORDER — LORAZEPAM 1 MG PO TABS
1.0000 mg | ORAL_TABLET | ORAL | Status: AC | PRN
  Filled 2022-06-25: qty 1

## 2022-06-25 MED ORDER — FOLIC ACID 1 MG PO TABS
1.0000 mg | ORAL_TABLET | Freq: Every day | ORAL | Status: DC
  Administered 2022-06-25 – 2022-06-29 (×5): 1 mg via ORAL
  Filled 2022-06-25 (×5): qty 1

## 2022-06-25 MED ORDER — FENOFIBRATE 54 MG PO TABS
54.0000 mg | ORAL_TABLET | Freq: Every day | ORAL | Status: DC
  Administered 2022-06-26 – 2022-06-29 (×4): 54 mg via ORAL
  Filled 2022-06-25 (×4): qty 1

## 2022-06-25 MED ORDER — PANTOPRAZOLE SODIUM 40 MG PO TBEC
40.0000 mg | DELAYED_RELEASE_TABLET | Freq: Two times a day (BID) | ORAL | Status: DC
  Administered 2022-06-26: 40 mg via ORAL
  Filled 2022-06-25: qty 1

## 2022-06-25 MED ORDER — LORAZEPAM 2 MG/ML IJ SOLN
2.0000 mg | Freq: Four times a day (QID) | INTRAMUSCULAR | Status: DC | PRN
  Filled 2022-06-25 (×2): qty 1

## 2022-06-25 MED ORDER — PROCHLORPERAZINE EDISYLATE 10 MG/2ML IJ SOLN: 5.0000 mg | Freq: Four times a day (QID) | INTRAMUSCULAR | Status: DC | PRN

## 2022-06-25 MED ORDER — ACETAMINOPHEN 325 MG PO TABS
650.0000 mg | ORAL_TABLET | Freq: Four times a day (QID) | ORAL | Status: DC | PRN
  Administered 2022-06-27 – 2022-06-29 (×3): 650 mg via ORAL
  Filled 2022-06-25 (×3): qty 2

## 2022-06-25 MED ORDER — LEVETIRACETAM IN NACL 1000 MG/100ML IV SOLN
1000.0000 mg | Freq: Two times a day (BID) | INTRAVENOUS | Status: DC
  Administered 2022-06-25: 1000 mg via INTRAVENOUS
  Filled 2022-06-25: qty 100

## 2022-06-25 MED ORDER — ENOXAPARIN SODIUM 40 MG/0.4ML IJ SOSY
40.0000 mg | PREFILLED_SYRINGE | INTRAMUSCULAR | Status: DC
  Administered 2022-06-25: 40 mg via SUBCUTANEOUS
  Filled 2022-06-25: qty 0.4

## 2022-06-25 MED ORDER — MELATONIN 5 MG PO TABS
5.0000 mg | ORAL_TABLET | Freq: Every evening | ORAL | Status: DC | PRN
  Filled 2022-06-25: qty 1

## 2022-06-25 MED ORDER — LEVETIRACETAM 500 MG PO TABS
500.0000 mg | ORAL_TABLET | Freq: Two times a day (BID) | ORAL | Status: DC
  Administered 2022-06-26 – 2022-06-29 (×7): 500 mg via ORAL
  Filled 2022-06-25 (×7): qty 1

## 2022-06-25 MED ORDER — LACTATED RINGERS IV SOLN: INTRAVENOUS | Status: AC

## 2022-06-25 MED ORDER — VITAMIN B-12 1000 MCG PO TABS
1000.0000 ug | ORAL_TABLET | Freq: Every day | ORAL | Status: DC
  Administered 2022-06-26 – 2022-06-29 (×4): 1000 ug via ORAL
  Filled 2022-06-25 (×4): qty 1

## 2022-06-25 MED ORDER — LORAZEPAM 2 MG/ML IJ SOLN
1.0000 mg | INTRAMUSCULAR | Status: AC | PRN
  Administered 2022-06-27 (×2): 2 mg via INTRAVENOUS
  Filled 2022-06-25 (×2): qty 1

## 2022-06-25 MED ORDER — FERROUS SULFATE 325 (65 FE) MG PO TABS
325.0000 mg | ORAL_TABLET | Freq: Two times a day (BID) | ORAL | Status: DC
  Administered 2022-06-26 – 2022-06-29 (×6): 325 mg via ORAL
  Filled 2022-06-25 (×7): qty 1

## 2022-06-25 MED ORDER — LEVETIRACETAM IN NACL 500 MG/100ML IV SOLN
500.0000 mg | Freq: Two times a day (BID) | INTRAVENOUS | Status: DC
  Filled 2022-06-25 (×2): qty 100

## 2022-06-25 NOTE — ED Notes (Addendum)
Seizure pads applied to stretcher, suction set up

## 2022-06-25 NOTE — ED Triage Notes (Signed)
Pt arrived to ED POV w/ c/o 2 back to back seizures. Pt noncompliant w/ sz meds. Pt postictal upon assessment and responsive to pain only. Airway patent.

## 2022-06-25 NOTE — H&P (Addendum)
History and Physical  JEWELINE BREITLING YQM:[id number] DOB: 03/14/1965 DOA: 06/25/2022  Referring physician: Dr. Jeraldine Loots, EDP. PCP: Grayce Sessions, NP  Outpatient Specialists: None Patient coming from: Home  Chief Complaint: Seizures  HPI: Christina Byrd is a 58 y.o. female with medical history significant for alcohol abuse drinks 2 beers and 4 shots of hard liquor per night, seizure disorder, history of hematemesis status post EGD on 06/02/2022 which revealed angiodysplasia of stomach and duodenum with bleeding, duodenal ulcer with recommendation to stop NSAIDs and to take p.o. PPI x 8 weeks, CKD 3B, anemia of chronic disease/iron deficiency anemia, folic acid deficiency, hyperlipidemia, GERD, who presented to [facility] ED from home after 2 witnessed seizure activities by a family member.  She was brought in by family member due to back-to-back seizures at home.  She reports she has been compliant with her home AEDs with ongoing alcohol use.  Upon ER assessment, the patient was postictal and responsive to pain only.  Last alcoholic intake was last night.  States she also took her Keppra medication.  In the ED, Tmax 99.1, tachycardic with heart rate in the 130s.  Chest x-ray with no significant findings.  The patient received 2 L IV fluid boluses NS and a loading dose of IV Keppra.  EDP discussed the case with neurology who recommended admission for further workup.  Admitted by [facility], hospitalist service, to [facility] telemetry medical unit as inpatient status.  The patient is at high risk for alcohol withdrawal.  At the time of this visit she is no longer postictal and is able to respond to questions appropriately.  She is in the room accompanied by her son at bedside.  ED Course: Tmax 99.1.  BP 123/78, pulse 111, respiration rate 17, O2 saturation 96% on room air.  Lab studies remarkable for serum bicarb less than 7, creatinine 1.29, magnesium 1.5, albumin 3.1, AST 59, ALT 29.  GFR  49.  CPK 84.  WBC 10.7.  Hemoglobin 8.5, at baseline.  Platelet count 365.  Review of Systems: Review of systems as noted in the HPI. All other systems reviewed and are negative.   Past Medical History:  Diagnosis Date   GASTROESOPHAGEAL REFLUX, NO ESOPHAGITIS 04/21/2006   Qualifier: Diagnosis of  By: Levada Schilling     GERD (gastroesophageal reflux disease) Dx 1995   HTN (hypertension)    Seizures (HCC)    Past Surgical History:  Procedure Laterality Date   BIOPSY  06/03/2022   Procedure: BIOPSY;  Surgeon: Jenel Lucks, MD;  Location: [facility] ENDOSCOPY;  Service: Gastroenterology;;   ESOPHAGOGASTRODUODENOSCOPY (EGD) WITH PROPOFOL N/A 06/03/2022   Procedure: ESOPHAGOGASTRODUODENOSCOPY (EGD) WITH PROPOFOL;  Surgeon: Jenel Lucks, MD;  Location: [facility] ENDOSCOPY;  Service: Gastroenterology;  Laterality: N/A;   HOT HEMOSTASIS N/A 06/03/2022   Procedure: HOT HEMOSTASIS (ARGON PLASMA COAGULATION/BICAP);  Surgeon: Jenel Lucks, MD;  Location: [facility] ENDOSCOPY;  Service: Gastroenterology;  Laterality: N/A;    Social History:  reports that she quit smoking about 10 years ago. Her smoking use included cigarettes. She has never used smokeless tobacco. She reports that she does not currently use alcohol after a past usage of about 49.0 standard drinks of alcohol per week. She reports that she does not use drugs.   Allergies  Allergen Reactions   Losartan Potassium     Rash   Levaquin [Levofloxacin In D5w] Rash   Ace Inhibitors Cough    REACTION: cough   Codeine Nausea Only   Cefepime  Rash    09/04/12 pm Patient started to break out with small macules after IV Vanco infusion, then macules increased in size after starting cefepime infusion.   Vancomycin Rash    09/04/12 pm Patient started to break out with small macules after IV Vanco infusion, then macules increased in size after starting cefepime infusion.    Family History  Problem Relation Age of Onset   CAD Mother       Prior  to Admission medications   Medication Sig Start Date End Date Taking? Authorizing Provider  allopurinol (ZYLOPRIM) 100 MG tablet Take 1 tablet (100 mg total) by mouth daily. 01/13/22   Grayce Sessions, NP  amLODipine (NORVASC) 10 MG tablet TAKE 1 TABLET(10 MG) BY MOUTH DAILY 05/22/22   Grayce Sessions, NP  atorvastatin (LIPITOR) 20 MG tablet TAKE 1 TABLET(20 MG) BY MOUTH DAILY 03/12/22   Grayce Sessions, NP  Blood Glucose Monitoring Suppl (TRUE METRIX METER) DEVI 1 kit by Does not apply route 3 (three) times daily. CHECK BLOOD SUGAR UP TO 3 TIMES DAILY. E11.9 02/27/19   Hoy Register, MD  clindamycin (CLEOCIN T) 1 % external solution Apply topically 2 (two) times daily.    [provider]  fenofibrate (TRICOR) 48 MG tablet Take 1 tablet (48 mg total) by mouth daily. 04/28/22   Grayce Sessions, NP  ferrous sulfate 325 (65 FE) MG tablet Take 1 tablet (325 mg total) by mouth 2 (two) times daily with a meal. 06/05/22   Leroy Sea, MD  fluticasone (FLONASE) 50 MCG/ACT nasal spray USE 2 SPRAYS IN EACH NOSTRIL DAILY 06/25/21   Hoy Register, MD  folic acid (FOLVITE) 1 MG tablet Take 1 tablet (1 mg total) by mouth daily. 06/05/22   Leroy Sea, MD  glucose blood (TRUE METRIX BLOOD GLUCOSE TEST) test strip Use as instructed 03/06/20   Hoy Register, MD  ketoconazole (NIZORAL) 2 % cream Apply 1 Application topically daily.    [provider]  levETIRAcetam (KEPPRA) 500 MG tablet Take 1 tablet (500 mg total) by mouth 2 (two) times daily. 05/04/21   Van Clines, MD  magnesium oxide (MAG-OX) 400 (240 Mg) MG tablet TAKE 1 TABLET BY MOUTH EVERY DAY 06/24/21   Hoy Register, MD  omega-3 acid ethyl esters (LOVAZA) 1 g capsule Take 2 capsules (2 g total) by mouth 2 (two) times daily. 10/06/21   Hoy Register, MD  ondansetron (ZOFRAN-ODT) 4 MG disintegrating tablet Take 1 tablet (4 mg total) by mouth every 8 (eight) hours as needed for nausea or vomiting. 05/29/22   Rancour,  Jeannett Senior, MD  pantoprazole (PROTONIX) 40 MG tablet Take 1 tablet (40 mg total) by mouth 2 (two) times daily before a meal. 06/05/22   Leroy Sea, MD  thiamine (VITAMIN B-1) 100 MG tablet Take 1 tablet (100 mg total) by mouth daily. 07/28/20   Van Clines, MD  tiZANidine (ZANAFLEX) 4 MG tablet TAKE 1 TABLET BY MOUTH EVERY 8 HOURS AS NEEDED FOR MUSCLE SPASMS 11/02/21   Hoy Register, MD  TRUEplus Lancets 28G MISC SMARTSIG:Topical 1 to 3 Times Daily 03/06/20   Hoy Register, MD  venlafaxine XR (EFFEXOR XR) 37.5 MG 24 hr capsule Take 1 capsule (37.5 mg total) by mouth daily with breakfast. For hot flashes 10/05/21   Hoy Register, MD  vitamin B-12 (CYANOCOBALAMIN) 1000 MCG tablet Take 1 tablet (1,000 mcg total) by mouth daily. 07/28/20   Van Clines, MD  metFORMIN (GLUCOPHAGE) 500 MG tablet  Take 0.5 tablets (250 mg total) by mouth daily with breakfast. 03/05/20 04/02/20  Anders Simmonds, PA-C    Physical Exam: BP 118/77   Pulse (!) 108   Temp 99.1 F (37.3 C) (Rectal)   Resp 16   Ht 5\' 2"  (1.575 m)   Wt 56.8 kg   LMP 08/23/2011   SpO2 96%   BMI 22.90 kg/m   General: 57 y.o. year-old female well developed well nourished in no acute distress.  Alert and oriented x3. Cardiovascular: Regular rate and rhythm with no rubs or gallops.  No thyromegaly or JVD noted.  No lower extremity edema. 2/4 pulses in all 4 extremities. Respiratory: Clear to auscultation with no wheezes or rales. Good inspiratory effort. Abdomen: Soft nontender nondistended with normal bowel sounds x4 quadrants. Muskuloskeletal: No cyanosis, clubbing or edema noted bilaterally Neuro: CN II-XII intact, strength, sensation, reflexes Skin: No ulcerative lesions noted or rashes Psychiatry: Judgement and insight appear normal. Mood is appropriate for condition and setting          Labs on Admission:  Basic Metabolic Panel: Recent Labs  Lab 06/25/22 1750  NA 141  K 3.5  CL 108  CO2 <7*  GLUCOSE 91  BUN 5*   CREATININE 1.29*  CALCIUM 8.3*   Liver Function Tests: Recent Labs  Lab 06/25/22 1750  AST 59*  ALT 29  ALKPHOS 80  BILITOT 0.3  PROT 6.8  ALBUMIN 3.1*   No results for input(s): "LIPASE", "AMYLASE" in the last 168 hours. No results for input(s): "AMMONIA" in the last 168 hours. CBC: Recent Labs  Lab 06/25/22 1750  WBC 10.7*  NEUTROABS 4.7  HGB 8.5*  HCT 27.8*  MCV 101.5*  PLT 365   Cardiac Enzymes: No results for input(s): "CKTOTAL", "CKMB", "CKMBINDEX", "TROPONINI" in the last 168 hours.  BNP (last 3 results) Recent Labs    06/05/22 0249  BNP 42.9    ProBNP (last 3 results) No results for input(s): "PROBNP" in the last 8760 hours.  CBG: Recent Labs  Lab 06/25/22 1803  GLUCAP 71    Radiological Exams on Admission: CT Head Wo Contrast  Result Date: 06/25/2022 CLINICAL DATA:  Seizures, altered level of consciousness EXAM: CT HEAD WITHOUT CONTRAST TECHNIQUE: Contiguous axial images were obtained from the base of the skull through the vertex without intravenous contrast. RADIATION DOSE REDUCTION: This exam was performed according to the departmental dose-optimization program which includes automated exposure control, adjustment of the mA and/or kV according to patient size and/or use of iterative reconstruction technique. COMPARISON:  09/06/2017 FINDINGS: Brain: Chronic areas of cortical encephalomalacia are seen within the left frontal and left occipital lobes. No evidence of acute infarct or hemorrhage. The lateral ventricles and midline structures are unremarkable. No acute extra-axial fluid collections. No mass effect. Vascular: No hyperdense vessel or unexpected calcification. Skull: Normal. Negative for fracture or focal lesion. Sinuses/Orbits: No acute finding. Other: None. IMPRESSION: 1. Stable head CT, no acute intracranial process. Electronically Signed   By: Sharlet Salina M.D.   On: 06/25/2022 19:15    EKG: I independently viewed the EKG done and my  findings are as followed: Sinus tachycardia rate of 128.  Nonspecific ST changes.  QTc 460.  Assessment/Plan Present on Admission: **None**  Principal Problem:   Seizure (HCC)  Witnessed seizure activities in the setting of seizure disorder and alcohol abuse Counseled on the importance of complete alcohol cessation at bedside Obtain Keppra level and EEG. Seizure precautions in place IV Ativan as needed  for breakthrough seizures Continue IV Keppra twice daily Neurology consulted, appreciate recommendations  Severe metabolic acidosis with serum bicarb of less than 7 Suspect related to seizing Obtain VBG to assess pH, also obtain lactic acid level Start gentle IV fluid hydration LR at 50 cc/h x 2 days Repeat BMP in the morning  Alcohol abuse with concern for alcohol withdrawal CIWA protocol in place Alcohol cessation counseling done at bedside [facility] consulted to assist with providing resources Continue multivitamin, thiamine and folic acid supplements.  Anemia of chronic disease/iron deficiency anemia History of angiodysplasia of stomach and duodenum History of duodenal ulcer in the setting of NSAID use Advised to complete NSAID use cessation. GI recommended p.o. PPI twice daily x 8 weeks from 06/02/2022 Hemoglobin stable at 8.5 No reported overt bleeding. Monitor H&H Resume iron supplement  Hyperlipidemia Resume home Lipitor and fenofibrate  CKD 3B Appears to be at her baseline creatinine Avoid nephrotoxic agents, dehydration and hypotension Start LR at 50 cc/h x 2 days Monitor urine output Repeat BMP normal  Hypomagnesemia Magnesium 1.5 Repleted intravenously with 2 g of IV magnesium.  Hypoalbuminemia in the setting of alcohol abuse Isolated elevated AST, 2:1 ratio with ALT in the setting of alcohol abuse Albumin 3.1 Monitor for now.    DVT prophylaxis: Subcu Lovenox daily  Code Status: Full code  Family Communication: Updated her son at  bedside.  Disposition Plan: Admitted to telemetry medical unit  Consults called: Neurology  Admission status: Inpatient status.   Status is: Inpatient The patient requires at least 2 midnight for further evaluation and treatment of present condition.   Darlin Drop MD [facility] Pager [phone]  If 7PM-7AM, please contact night-coverage [email or web address] Password [facility]  06/25/2022, 8:28 PM

## 2022-06-25 NOTE — ED Notes (Signed)
ED TO INPATIENT HANDOFF REPORT  ED Nurse Name and Phone #: Morrie Sheldon RN [phone]  S Name/Age/Gender Christina Byrd 57 y.o. female Room/Bed: 031C/031C  Code Status   Code Status: Full Code  Home/SNF/Other Home Patient oriented to: self, place, and situation Is this baseline? No   Triage Complete: Triage complete  Chief Complaint Seizure [facility]) [R56.9]  Triage Note Pt arrived to ED POV w/ c/o 2 back to back seizures. Pt noncompliant w/ sz meds. Pt postictal upon assessment and responsive to pain only. Airway patent.    Allergies Allergies  Allergen Reactions   Losartan Potassium     Rash   Levaquin [Levofloxacin In D5w] Rash   Ace Inhibitors Cough    REACTION: cough   Codeine Nausea Only   Cefepime Rash    09/04/12 pm Patient started to break out with small macules after IV Vanco infusion, then macules increased in size after starting cefepime infusion.   Vancomycin Rash    09/04/12 pm Patient started to break out with small macules after IV Vanco infusion, then macules increased in size after starting cefepime infusion.    Level of Care/Admitting Diagnosis ED Disposition     ED Disposition  Admit   Condition  --   Comment  Hospital Area: [facility] [[id number]]  Level of Care: Telemetry Medical [104]  May admit patient to [facility] or [facility] if equivalent level of care is available:: No  Covid Evaluation: Asymptomatic - no recent exposure (last 10 days) testing not required  Diagnosis: Seizure [facility]) [[id number]]  Admitting Physician: Darlin Drop [[id number]]  Attending Physician: Darlin Drop [[id number]]  Certification:: I certify this patient will need inpatient services for at least 2 midnights  Estimated Length of Stay: 2          B Medical/Surgery History Past Medical History:  Diagnosis Date   GASTROESOPHAGEAL REFLUX, NO ESOPHAGITIS 04/21/2006   Qualifier: Diagnosis of  By: Levada Schilling     GERD (gastroesophageal reflux disease)  Dx 1995   HTN (hypertension)    Seizures (HCC)    Past Surgical History:  Procedure Laterality Date   BIOPSY  06/03/2022   Procedure: BIOPSY;  Surgeon: Jenel Lucks, MD;  Location: [facility] ENDOSCOPY;  Service: Gastroenterology;;   ESOPHAGOGASTRODUODENOSCOPY (EGD) WITH PROPOFOL N/A 06/03/2022   Procedure: ESOPHAGOGASTRODUODENOSCOPY (EGD) WITH PROPOFOL;  Surgeon: Jenel Lucks, MD;  Location: [facility] ENDOSCOPY;  Service: Gastroenterology;  Laterality: N/A;   HOT HEMOSTASIS N/A 06/03/2022   Procedure: HOT HEMOSTASIS (ARGON PLASMA COAGULATION/BICAP);  Surgeon: Jenel Lucks, MD;  Location: [facility] ENDOSCOPY;  Service: Gastroenterology;  Laterality: N/A;     A IV Location/Drains/Wounds Patient Lines/Drains/Airways Status     Active Line/Drains/Airways     Name Placement date Placement time Site Days   Peripheral IV 06/25/22 20 G Right;Posterior Forearm 06/25/22  1753  Forearm  less than 1            Intake/Output Last 24 hours No intake or output data in the 24 hours ending 06/25/22 2137  Labs/Imaging Results for orders placed or performed during the hospital encounter of 06/25/22 (from the past 48 hour(s))  Comprehensive metabolic panel     Status: Abnormal   Collection Time: 06/25/22  5:50 PM  Result Value Ref Range   Sodium 141 135 - 145 mmol/L   Potassium 3.5 3.5 - 5.1 mmol/L   Chloride 108 98 - 111 mmol/L   CO2 <7 (L) 22 - 32 mmol/L   Glucose,  Bld 91 70 - 99 mg/dL    Comment: Glucose reference range applies only to samples taken after fasting for at least 8 hours.   BUN 5 (L) 6 - 20 mg/dL   Creatinine, Ser 1.61 (H) 0.44 - 1.00 mg/dL   Calcium 8.3 (L) 8.9 - 10.3 mg/dL   Total Protein 6.8 6.5 - 8.1 g/dL   Albumin 3.1 (L) 3.5 - 5.0 g/dL   AST 59 (H) 15 - 41 U/L   ALT 29 0 - 44 U/L   Alkaline Phosphatase 80 38 - 126 U/L   Total Bilirubin 0.3 0.3 - 1.2 mg/dL   GFR, Estimated 49 (L) >60 mL/min    Comment: (NOTE) Calculated using the CKD-EPI Creatinine Equation  (2021)    Anion gap NOT CALCULATED 5 - 15    Comment: ELECTROLYTES REPEATED TO VERIFY Performed at [facility], [address] [place], [place] [address]   CBC with Differential/Platelet     Status: Abnormal   Collection Time: 06/25/22  5:50 PM  Result Value Ref Range   WBC 10.7 (H) 4.0 - 10.5 K/uL   RBC 2.74 (L) 3.87 - 5.11 MIL/uL   Hemoglobin 8.5 (L) 12.0 - 15.0 g/dL   HCT 54.0 (L) 98.1 - 19.1 %   MCV 101.5 (H) 80.0 - 100.0 fL   MCH 31.0 26.0 - 34.0 pg   MCHC 30.6 30.0 - 36.0 g/dL   RDW 47.8 (H) 29.5 - 62.1 %   Platelets 365 150 - 400 K/uL   nRBC 0.2 0.0 - 0.2 %   Neutrophils Relative % 44 %   Neutro Abs 4.7 1.7 - 7.7 K/uL   Lymphocytes Relative 51 %   Lymphs Abs 5.5 (H) 0.7 - 4.0 K/uL   Monocytes Relative 4 %   Monocytes Absolute 0.4 0.1 - 1.0 K/uL   Eosinophils Relative 0 %   Eosinophils Absolute 0.0 0.0 - 0.5 K/uL   Basophils Relative 1 %   Basophils Absolute 0.1 0.0 - 0.1 K/uL   nRBC 1 (H) 0 /100 WBC   Abs Immature Granulocytes 0.00 0.00 - 0.07 K/uL    Comment: Performed at [facility], [address], [place], [place] [address]  Ethanol     Status: Abnormal   Collection Time: 06/25/22  5:50 PM  Result Value Ref Range   Alcohol, Ethyl (B) 24 (H) <10 mg/dL    Comment: (NOTE) Lowest detectable limit for serum alcohol is 10 mg/dL.  For medical purposes only. Performed at [facility], [address] [place], [place] [address]   Magnesium     Status: Abnormal   Collection Time: 06/25/22  6:00 PM  Result Value Ref Range   Magnesium 1.5 (L) 1.7 - 2.4 mg/dL    Comment: Performed at [facility], [address], [place], [place] [address]  CK     Status: None   Collection Time: 06/25/22  6:00 PM  Result Value Ref Range   Total CK 84 38 - 234 U/L    Comment: Performed at [facility], [address], [place], [place] [address]  CBG monitoring, ED     Status: None   Collection Time: 06/25/22  6:03 PM  Result Value Ref Range    Glucose-Capillary 71 70 - 99 mg/dL    Comment: Glucose reference range applies only to samples taken after fasting for at least 8 hours.   Comment 1 Notify RN    Comment 2 Document  in Chart    DG CHEST PORT 1 VIEW  Result Date: 06/25/2022 CLINICAL DATA:  Seizure, altered mental status EXAM: PORTABLE CHEST 1 VIEW COMPARISON:  06/02/2022 FINDINGS: The heart size and mediastinal contours are within normal limits. Both lungs are clear. The visualized skeletal structures are unremarkable. IMPRESSION: No active disease. Electronically Signed   By: Helyn Numbers M.D.   On: 06/25/2022 21:03   CT Head Wo Contrast  Result Date: 06/25/2022 CLINICAL DATA:  Seizures, altered level of consciousness EXAM: CT HEAD WITHOUT CONTRAST TECHNIQUE: Contiguous axial images were obtained from the base of the skull through the vertex without intravenous contrast. RADIATION DOSE REDUCTION: This exam was performed according to the departmental dose-optimization program which includes automated exposure control, adjustment of the mA and/or kV according to patient size and/or use of iterative reconstruction technique. COMPARISON:  09/06/2017 FINDINGS: Brain: Chronic areas of cortical encephalomalacia are seen within the left frontal and left occipital lobes. No evidence of acute infarct or hemorrhage. The lateral ventricles and midline structures are unremarkable. No acute extra-axial fluid collections. No mass effect. Vascular: No hyperdense vessel or unexpected calcification. Skull: Normal. Negative for fracture or focal lesion. Sinuses/Orbits: No acute finding. Other: None. IMPRESSION: 1. Stable head CT, no acute intracranial process. Electronically Signed   By: Sharlet Salina M.D.   On: 06/25/2022 19:15    Pending Labs Unresulted Labs (From admission, onward)     Start     Ordered   07/02/22 0500  Creatinine, serum  (enoxaparin (LOVENOX)    CrCl >/= 30 ml/min)  Weekly,   R     Comments: while on enoxaparin therapy     06/25/22 2029   06/26/22 0500  CBC  Tomorrow morning,   R        06/25/22 2034   06/26/22 0500  Comprehensive metabolic panel  Tomorrow morning,   R        06/25/22 2034   06/26/22 0500  Magnesium  Tomorrow morning,   R        06/25/22 2034   06/26/22 0500  Phosphorus  Tomorrow morning,   R        06/25/22 2034   06/25/22 2030  Levetiracetam level  Add-on,   AD        06/25/22 2029   06/25/22 1958  Urinalysis, Routine w reflex microscopic -Urine, Clean Catch  Once,   URGENT       Question:  Specimen Source  Answer:  Urine, Clean Catch   06/25/22 1957   06/25/22 1958  Rapid urine drug screen (hospital performed)  ONCE - STAT,   STAT        06/25/22 1957            Vitals/Pain Today's Vitals   06/25/22 1830 06/25/22 1845 06/25/22 1900 06/25/22 2132  BP: 120/79 114/77 118/77 126/77  Pulse: (!) 117 (!) 115 (!) 108 (!) 103  Resp: 16 15 16 16   Temp:    99.4 F (37.4 C)  TempSrc:    Oral  SpO2: 97% 100% 96% 100%  Weight:      Height:        Isolation Precautions No active isolations  Medications Medications  levETIRAcetam (KEPPRA) IVPB 500 mg/100 mL premix (has no administration in time range)    Or  levETIRAcetam (KEPPRA) tablet 500 mg (has no administration in time range)  enoxaparin (LOVENOX) injection 40 mg (has no administration in time range)  atorvastatin (LIPITOR) tablet 20 mg (has no administration  in time range)  fenofibrate tablet 54 mg (has no administration in time range)  ferrous sulfate tablet 325 mg (has no administration in time range)  venlafaxine XR (EFFEXOR-XR) 24 hr capsule 37.5 mg (has no administration in time range)  cyanocobalamin (VITAMIN B12) tablet 1,000 mcg (has no administration in time range)  pantoprazole (PROTONIX) EC tablet 40 mg (has no administration in time range)  omega-3 acid ethyl esters (LOVAZA) capsule 2 g (has no administration in time range)  LORazepam (ATIVAN) tablet 1-4 mg (has no administration in time range)    Or   LORazepam (ATIVAN) injection 1-4 mg (has no administration in time range)  thiamine (VITAMIN B1) tablet 100 mg (has no administration in time range)    Or  thiamine (VITAMIN B1) injection 100 mg (has no administration in time range)  folic acid (FOLVITE) tablet 1 mg (has no administration in time range)  multivitamin with minerals tablet 1 tablet (has no administration in time range)  LORazepam (ATIVAN) injection 2 mg (has no administration in time range)  lactated ringers infusion (has no administration in time range)  magnesium sulfate IVPB 2 g 50 mL (has no administration in time range)  acetaminophen (TYLENOL) tablet 650 mg (has no administration in time range)  prochlorperazine (COMPAZINE) injection 5 mg (has no administration in time range)  melatonin tablet 5 mg (has no administration in time range)  polyethylene glycol (MIRALAX / GLYCOLAX) packet 17 g (has no administration in time range)  sodium chloride 0.9 % bolus 1,000 mL (0 mLs Intravenous Stopped 06/25/22 2136)  sodium chloride 0.9 % bolus 1,000 mL (0 mLs Intravenous Stopped 06/25/22 2136)    Mobility walks     Focused Assessments Neuro Assessment Handoff:  Swallow screen pass? Yes          Neuro Assessment: Exceptions to WDL Neuro Checks:      Has TPA been given? No     R Recommendations: See Admitting Provider Note  Report given to:   Additional Notes:

## 2022-06-25 NOTE — Consult Note (Incomplete)
Neurology Consultation Reason for Consult: Breakthrough seizures  Requesting Physician: Dow Adolph  CC: Seizures   History is obtained from: Chart review and patient, family sleeping at time of my eval  HPI: Christina Byrd is a 57 y.o. female with a past medical history significant for hypertension, hyperlipidemia, type 2 diabetes, GI bleed with duodenal ulcer and gastric AVM (s/p EGD on 06/02/2022), alcohol abuse (ongoing), history of cocaine use, and history of focal seizures with secondary generalization and poor medical adherence  She presented with 2 witnessed seizures while she was in the passenger seat with her husband driving.  The patient reports medication adherence to me and other providers, but per chart review husband reported patient has not been taking her antiseizure medications to ED provider.  Levels were not checked prior to administration of Keppra 1000 mg in the ED.  Unfortunately she had a third witnessed seizure in the ED at which time I was asked for recommendations and recommended giving an additional 1000 mg Keppra.  Keppra level was collected after loading dose was given, and notably this will not be accurate  She is continuing to drink.  She follows with Dr. Karel Jarvis on an outpatient basis and was last seen 05/04/2021 at which time it was reported she had been seizure-free since May 2020 on Keppra 500 twice daily.    Per Dr. 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    No other antiseizure medications have been tried other than Keppra, and she tolerates it well when she is taking it regularly  Labs on admission are additionally notable for ethanol level of 24, mild leukocytosis to 10.79, and anemia of chronic disease to 8.5  At the time of my evaluation at 2:30 AM she is very sleepy but agrees to participate in limited evaluation  ROS: Limited by patient  participation  Past Medical History:  Diagnosis Date   GASTROESOPHAGEAL REFLUX, NO ESOPHAGITIS 04/21/2006   Qualifier: Diagnosis of  By: Levada Schilling     GERD (gastroesophageal reflux disease) Dx 1995   HTN (hypertension)    Seizures (HCC)    Past Surgical History:  Procedure Laterality Date   BIOPSY  06/03/2022   Procedure: BIOPSY;  Surgeon: Jenel Lucks, MD;  Location: [facility] ENDOSCOPY;  Service: Gastroenterology;;   ESOPHAGOGASTRODUODENOSCOPY (EGD) WITH PROPOFOL N/A 06/03/2022   Procedure: ESOPHAGOGASTRODUODENOSCOPY (EGD) WITH PROPOFOL;  Surgeon: Jenel Lucks, MD;  Location: [facility] ENDOSCOPY;  Service: Gastroenterology;  Laterality: N/A;   HOT HEMOSTASIS N/A 06/03/2022   Procedure: HOT HEMOSTASIS (ARGON PLASMA COAGULATION/BICAP);  Surgeon: Jenel Lucks, MD;  Location: [facility] ENDOSCOPY;  Service: Gastroenterology;  Laterality: N/A;   Current Outpatient Medications  Medication Instructions   allopurinol (ZYLOPRIM) 100 mg, Oral, Daily   amLODipine (NORVASC) 10 MG tablet TAKE 1 TABLET(10 MG) BY MOUTH DAILY   atorvastatin (LIPITOR) 20 MG tablet TAKE 1 TABLET(20 MG) BY MOUTH DAILY   Blood Glucose Monitoring Suppl (TRUE METRIX METER) DEVI 1 kit, Does not apply, 3 times daily, CHECK BLOOD SUGAR UP TO 3 TIMES DAILY. E11.9   clindamycin (CLEOCIN T) 1 % external solution Topical, 2 times daily   cyanocobalamin (VITAMIN B12) 1,000 mcg, Oral, Daily   fenofibrate (TRICOR) 48 mg, Oral, Daily   ferrous sulfate 325 mg, Oral, 2 times daily with meals   fluticasone (FLONASE) 50 MCG/ACT nasal spray USE 2 SPRAYS IN EACH NOSTRIL DAILY   folic acid (FOLVITE) 1 mg, Oral, Daily  glucose blood (TRUE METRIX BLOOD GLUCOSE TEST) test strip Use as instructed   ketoconazole (NIZORAL) 2 % cream 1 Application, Topical, Daily   levETIRAcetam (KEPPRA) 500 mg, Oral, 2 times daily   magnesium oxide (MAG-OX) 400 (240 Mg) MG tablet TAKE 1 TABLET BY MOUTH EVERY DAY   omega-3 acid ethyl esters (LOVAZA) 2 g,  Oral, 2 times daily   ondansetron (ZOFRAN-ODT) 4 mg, Oral, Every 8 hours PRN   pantoprazole (PROTONIX) 40 mg, Oral, 2 times daily before meals   thiamine (VITAMIN B-1) 100 mg, Oral, Daily   tiZANidine (ZANAFLEX) 4 MG tablet TAKE 1 TABLET BY MOUTH EVERY 8 HOURS AS NEEDED FOR MUSCLE SPASMS   TRUEplus Lancets 28G MISC SMARTSIG:Topical 1 to 3 Times Daily   venlafaxine XR (EFFEXOR XR) 37.5 mg, Oral, Daily with breakfast, For hot flashes     Family History  Problem Relation Age of Onset   CAD Mother     Social History:  reports that she quit smoking about 10 years ago. Her smoking use included cigarettes. She has never used smokeless tobacco. She reports that she does not currently use alcohol after a past usage of about 49.0 standard drinks of alcohol per week. She reports that she does not use drugs.  Exam: Current vital signs: BP 126/77   Pulse (!) 103   Temp 98 F (36.7 C) (Oral)   Resp 16   Ht 5\' 2"  (1.575 m)   Wt 56.8 kg   LMP 08/23/2011   SpO2 100%   BMI 22.90 kg/m  Vital signs in last 24 hours: Temp:  [98 F (36.7 C)-99.4 F (37.4 C)] 98 F (36.7 C) (05/03 2200) Pulse Rate:  [103-135] 103 (05/03 2132) Resp:  [15-22] 16 (05/03 2132) BP: (111-129)/(74-85) 126/77 (05/03 2132) SpO2:  [95 %-100 %] 100 % (05/03 2132) Weight:  [56.8 kg] 56.8 kg (05/03 1807)   Physical Exam  Constitutional: Comfortable, no acute distress Psych: Affect mildly irritable but cooperative Eyes: No scleral injection HENT: No oropharyngeal obstruction.  She does have some intermittent mouth chewing movements which she states is her baseline.  She is able to attend to me while having these movements and responds appropriately to questions MSK: no major joint deformities.  Cardiovascular: Perfusing extremities well Respiratory: Effort normal, non-labored breathing GI: Soft.  No distension. There is no tenderness.  Skin: Warm dry and intact visible skin  Neuro: Mental Status: Patient is  sleeping but awakens easily.  She is able to tell me it is 2024 but does not recall what month it is.  She tells me she is 57 instead of 57 years old.  She is oriented to [facility] and knows she had a seizure.  Able to name simple objects and repeat Cranial Nerves: II: Visual Fields are full.  III,IV, VI: EOMI  VII: Facial movement is symmetric.  VIII: hearing is intact to voice XII: tongue is midline without atrophy or fasciculations.  Motor: She declines full confrontational strength testing, but uses her bilateral upper extremities equally to pull her blanket back up for herself and moves both her legs equally to light touch Sensory: Reactive to light touch in all 4 extremities Deep Tendon Reflexes: Deferred by patient Cerebellar: Deferred by patient Gait:  Deferred by patient  I have reviewed labs in epic and the results pertinent to this consultation are:  Basic Metabolic Panel: Recent Labs  Lab 06/25/22 1750 06/25/22 1800  NA 141  --   K 3.5  --  CL 108  --   CO2 <7*  --   GLUCOSE 91  --   BUN 5*  --   CREATININE 1.29*  --   CALCIUM 8.3*  --   MG  --  1.5*   Baseline Cr 0.96  CBC: Recent Labs  Lab 06/25/22 1750  WBC 10.7*  NEUTROABS 4.7  HGB 8.5*  HCT 27.8*  MCV 101.5*  PLT 365    Coagulation Studies: No results for input(s): "LABPROT", "INR" in the last 72 hours.   CK 84  UA, UDS negative  I have reviewed the images obtained:  Head CT personally reviewed, agree with radiology:   Brain: Chronic areas of cortical encephalomalacia are seen within the left frontal and left occipital lobes. No evidence of acute infarct or hemorrhage. The lateral ventricles and midline structures are unremarkable. No acute extra-axial fluid collections. No mass effect.   Impression: Breakthrough seizure likely in the setting of medication nonadherence as reported by family, ongoing alcohol use, hypomagnesemia.  Initial verbal recommendations at ~8 PM: -  Check CK and Magnesium, treat as needed - UA, UDS, CXR to look for additional provoking factors - Keppra 2000 mg load, then resume home 500 mg BID - Admit for observation to confirm seizure control and full return to baseline  Additional recommendations after full evaluation: - Will consider EEG if she has further seizure activity or not fully returning to baseline; please reach out to neurology in this situation - Please note Keppra level was collected after loading dose was given, and notably this will not be accurate for assessing medication adherence - Continue alcohol cessation counseling - Seizure precautions as below should be reviewed with the family and patient and included in discharge instructions - Inpatient neurology will be available as needed, please reach out if any questions or concerns arise  Standard seizure precautions: Per [facility] statutes, patients with seizures are not allowed to drive until  they have been seizure-free for six months. Use caution when using heavy equipment or power tools. Avoid working on ladders or at heights. Take showers instead of baths. Ensure the water temperature is not too high on the home water heater. Do not go swimming alone. When caring for infants or small children, sit down when holding, feeding, or changing them to minimize risk of injury to the child in the event you have a seizure.  To reduce risk of seizures, maintain good sleep hygiene avoid alcohol and illicit drug use, take all anti-seizure medications as prescribed.   Brooke Dare MD-PhD [facility] [phone] Available 7 PM to 7 AM, outside of these hours please call Neurologist on call as listed on Amion.

## 2022-06-25 NOTE — ED Provider Notes (Signed)
[place] [facility] EMERGENCY DEPARTMENT AT [facility] Provider Note   CSN: [id number] Arrival date & time: 06/25/22  1742     History  Chief Complaint  Patient presents with   Seizures    Christina Byrd is a 57 y.o. female.  HPI Presents with her husband who provides a history.  Patient is here due to 2 seizures separated by 30 minutes.  Husband notes that they were riding in a truck, when she had the 2 witnessed events.  No trauma. He notes that the patient has not been taking her antiepileptics, but has been drinking alcohol since recent hospitalization.  Level 5 caveat secondary to acuity of condition.    Home Medications Prior to Admission medications   Medication Sig Start Date End Date Taking? Authorizing Provider  allopurinol (ZYLOPRIM) 100 MG tablet Take 1 tablet (100 mg total) by mouth daily. 01/13/22   Grayce Sessions, NP  amLODipine (NORVASC) 10 MG tablet TAKE 1 TABLET(10 MG) BY MOUTH DAILY 05/22/22   Grayce Sessions, NP  atorvastatin (LIPITOR) 20 MG tablet TAKE 1 TABLET(20 MG) BY MOUTH DAILY 03/12/22   Grayce Sessions, NP  Blood Glucose Monitoring Suppl (TRUE METRIX METER) DEVI 1 kit by Does not apply route 3 (three) times daily. CHECK BLOOD SUGAR UP TO 3 TIMES DAILY. E11.9 02/27/19   Hoy Register, MD  clindamycin (CLEOCIN T) 1 % external solution Apply topically 2 (two) times daily.    [provider]  fenofibrate (TRICOR) 48 MG tablet Take 1 tablet (48 mg total) by mouth daily. 04/28/22   Grayce Sessions, NP  ferrous sulfate 325 (65 FE) MG tablet Take 1 tablet (325 mg total) by mouth 2 (two) times daily with a meal. 06/05/22   Leroy Sea, MD  fluticasone (FLONASE) 50 MCG/ACT nasal spray USE 2 SPRAYS IN EACH NOSTRIL DAILY 06/25/21   Hoy Register, MD  folic acid (FOLVITE) 1 MG tablet Take 1 tablet (1 mg total) by mouth daily. 06/05/22   Leroy Sea, MD  glucose blood (TRUE METRIX BLOOD GLUCOSE TEST) test strip Use as instructed  03/06/20   Hoy Register, MD  ketoconazole (NIZORAL) 2 % cream Apply 1 Application topically daily.    [provider]  levETIRAcetam (KEPPRA) 500 MG tablet Take 1 tablet (500 mg total) by mouth 2 (two) times daily. 05/04/21   Van Clines, MD  magnesium oxide (MAG-OX) 400 (240 Mg) MG tablet TAKE 1 TABLET BY MOUTH EVERY DAY 06/24/21   Hoy Register, MD  omega-3 acid ethyl esters (LOVAZA) 1 g capsule Take 2 capsules (2 g total) by mouth 2 (two) times daily. 10/06/21   Hoy Register, MD  ondansetron (ZOFRAN-ODT) 4 MG disintegrating tablet Take 1 tablet (4 mg total) by mouth every 8 (eight) hours as needed for nausea or vomiting. 05/29/22   Rancour, Jeannett Senior, MD  pantoprazole (PROTONIX) 40 MG tablet Take 1 tablet (40 mg total) by mouth 2 (two) times daily before a meal. 06/05/22   Leroy Sea, MD  thiamine (VITAMIN B-1) 100 MG tablet Take 1 tablet (100 mg total) by mouth daily. 07/28/20   Van Clines, MD  tiZANidine (ZANAFLEX) 4 MG tablet TAKE 1 TABLET BY MOUTH EVERY 8 HOURS AS NEEDED FOR MUSCLE SPASMS 11/02/21   Hoy Register, MD  TRUEplus Lancets 28G MISC SMARTSIG:Topical 1 to 3 Times Daily 03/06/20   Hoy Register, MD  venlafaxine XR (EFFEXOR XR) 37.5 MG 24 hr capsule Take 1 capsule (37.5 mg total)  by mouth daily with breakfast. For hot flashes 10/05/21   Hoy Register, MD  vitamin B-12 (CYANOCOBALAMIN) 1000 MCG tablet Take 1 tablet (1,000 mcg total) by mouth daily. 07/28/20   Van Clines, MD  metFORMIN (GLUCOPHAGE) 500 MG tablet Take 0.5 tablets (250 mg total) by mouth daily with breakfast. 03/05/20 04/02/20  Anders Simmonds, PA-C      Allergies    Losartan potassium, Levaquin [levofloxacin in d5w], Ace inhibitors, Codeine, Cefepime, and Vancomycin    Review of Systems   Review of Systems  Unable to perform ROS: Acuity of condition    Physical Exam Updated Vital Signs BP 118/77   Pulse (!) 108   Temp 99.1 F (37.3 C) (Rectal)   Resp 16   Ht 5\' 2"  (1.575 m)   Wt  56.8 kg   LMP 08/23/2011   SpO2 96%   BMI 22.90 kg/m  Physical Exam Vitals and nursing note reviewed.  Constitutional:      General: She is in acute distress.     Appearance: She is well-developed. She is obese. She is ill-appearing.  HENT:     Head: Normocephalic and atraumatic.  Eyes:     Conjunctiva/sclera: Conjunctivae normal.  Cardiovascular:     Rate and Rhythm: Regular rhythm. Tachycardia present.  Pulmonary:     Effort: Pulmonary effort is normal. No respiratory distress.     Breath sounds: Normal breath sounds. No stridor.  Abdominal:     General: There is no distension.  Skin:    General: Skin is warm and dry.  Neurological:     Mental Status: She is alert and oriented to person, place, and time.     Cranial Nerves: No cranial nerve deficit.     Comments: Obtunded, not following commands, but moving extremities  Psychiatric:        Cognition and Memory: Cognition is impaired. Memory is impaired.     ED Results / Procedures / Treatments   Labs (all labs ordered are listed, but only abnormal results are displayed) Labs Reviewed  COMPREHENSIVE METABOLIC PANEL - Abnormal; Notable for the following components:      Result Value   CO2 <7 (*)    BUN 5 (*)    Creatinine, Ser 1.29 (*)    Calcium 8.3 (*)    Albumin 3.1 (*)    AST 59 (*)    GFR, Estimated 49 (*)    All other components within normal limits  CBC WITH DIFFERENTIAL/PLATELET - Abnormal; Notable for the following components:   WBC 10.7 (*)    RBC 2.74 (*)    Hemoglobin 8.5 (*)    HCT 27.8 (*)    MCV 101.5 (*)    RDW 16.1 (*)    Lymphs Abs 5.5 (*)    nRBC 1 (*)    All other components within normal limits  ETHANOL - Abnormal; Notable for the following components:   Alcohol, Ethyl (B) 24 (*)    All other components within normal limits  MAGNESIUM - Abnormal; Notable for the following components:   Magnesium 1.5 (*)    All other components within normal limits  CK  URINALYSIS, ROUTINE W REFLEX  MICROSCOPIC  RAPID URINE DRUG SCREEN, HOSP PERFORMED  LEVETIRACETAM LEVEL  CBC  COMPREHENSIVE METABOLIC PANEL  MAGNESIUM  PHOSPHORUS  CBG MONITORING, ED    EKG EKG Interpretation  Date/Time:  Friday Jun 25 2022 17:55:17 EDT Ventricular Rate:  128 PR Interval:  147 QRS Duration: 65 QT Interval:  315 QTC Calculation: 460 R Axis:   61 Text Interpretation: Sinus tachycardia Low voltage, precordial leads Nonspecific T abnrm, anterolateral leads Abnormal ECG Confirmed by Gerhard Munch [phone]) on 06/25/2022 6:00:48 PM  Radiology CT Head Wo Contrast  Result Date: 06/25/2022 CLINICAL DATA:  Seizures, altered level of consciousness EXAM: CT HEAD WITHOUT CONTRAST TECHNIQUE: Contiguous axial images were obtained from the base of the skull through the vertex without intravenous contrast. RADIATION DOSE REDUCTION: This exam was performed according to the departmental dose-optimization program which includes automated exposure control, adjustment of the mA and/or kV according to patient size and/or use of iterative reconstruction technique. COMPARISON:  09/06/2017 FINDINGS: Brain: Chronic areas of cortical encephalomalacia are seen within the left frontal and left occipital lobes. No evidence of acute infarct or hemorrhage. The lateral ventricles and midline structures are unremarkable. No acute extra-axial fluid collections. No mass effect. Vascular: No hyperdense vessel or unexpected calcification. Skull: Normal. Negative for fracture or focal lesion. Sinuses/Orbits: No acute finding. Other: None. IMPRESSION: 1. Stable head CT, no acute intracranial process. Electronically Signed   By: Sharlet Salina M.D.   On: 06/25/2022 19:15    Procedures Procedures    Medications Ordered in ED Medications  levETIRAcetam (KEPPRA) IVPB 500 mg/100 mL premix (has no administration in time range)    Or  levETIRAcetam (KEPPRA) tablet 500 mg (has no administration in time range)  enoxaparin (LOVENOX) injection 40  mg (has no administration in time range)  atorvastatin (LIPITOR) tablet 20 mg (has no administration in time range)  fenofibrate tablet 54 mg (has no administration in time range)  ferrous sulfate tablet 325 mg (has no administration in time range)  venlafaxine XR (EFFEXOR-XR) 24 hr capsule 37.5 mg (has no administration in time range)  cyanocobalamin (VITAMIN B12) tablet 1,000 mcg (has no administration in time range)  pantoprazole (PROTONIX) EC tablet 40 mg (has no administration in time range)  omega-3 acid ethyl esters (LOVAZA) capsule 2 g (has no administration in time range)  LORazepam (ATIVAN) tablet 1-4 mg (has no administration in time range)    Or  LORazepam (ATIVAN) injection 1-4 mg (has no administration in time range)  thiamine (VITAMIN B1) tablet 100 mg (has no administration in time range)    Or  thiamine (VITAMIN B1) injection 100 mg (has no administration in time range)  folic acid (FOLVITE) tablet 1 mg (has no administration in time range)  multivitamin with minerals tablet 1 tablet (has no administration in time range)  LORazepam (ATIVAN) injection 2 mg (has no administration in time range)  lactated ringers infusion (has no administration in time range)  magnesium sulfate IVPB 2 g 50 mL (has no administration in time range)  acetaminophen (TYLENOL) tablet 650 mg (has no administration in time range)  prochlorperazine (COMPAZINE) injection 5 mg (has no administration in time range)  melatonin tablet 5 mg (has no administration in time range)  polyethylene glycol (MIRALAX / GLYCOLAX) packet 17 g (has no administration in time range)  sodium chloride 0.9 % bolus 1,000 mL (1,000 mLs Intravenous New Bag/Given 06/25/22 1802)  sodium chloride 0.9 % bolus 1,000 mL (1,000 mLs Intravenous New Bag/Given 06/25/22 2009)    ED Course/ Medical Decision Making/ A&P                             Medical Decision Making Patient presents just after seizure, postictal.  Given the high  husband's endorsement of alcohol use, medication noncompliance  or some suspicion for this contributing to the episode, but given the 2 episodes, close proximity, differential includes infection, dehydration, acute intoxication or withdrawal. Patient placed on monitors, seizure pads and precautions. Cardiac 125 sinus tach abnormal Pulse ox 96% room air normal   Amount and/or Complexity of Data Reviewed Independent Historian: spouse External Data Reviewed: notes. Labs: ordered. Decision-making details documented in ED Course. Radiology: ordered and independent interpretation performed. Decision-making details documented in ED Course. ECG/medicine tests: ordered and independent interpretation performed. Decision-making details documented in ED Course.  Risk Prescription drug management. Decision regarding hospitalization.   After initial loading of Keppra patient was placed on continuous monitoring.  About 1 hour later the patient had another seizure episode.  In between she had returned nearly to baseline.  Following the likely third seizure today patient was postictal, but started to answer questions appropriately.  With consideration of her alcohol use, contributing to breakthrough seizures, though no overt evidence for withdrawal, with minimal tachycardia, no hypertension, no tremor, I discussed her case with our neurology colleagues.  Patient will receive a second 1 g Keppra loading dose, will be admitted to our hospitalist team for ongoing monitoring, management with our neurology team following as a consulting service. Initial labs generally unremarkable aside from slight elevation in alcohol level, mild hypomagnesemia. Head CT unremarkable, no arrhythmia noted on monitor.         Final Clinical Impression(s) / ED Diagnoses Final diagnoses:  Seizure [facility])    Rx / DC Orders ED Discharge Orders     None         Gerhard Munch, MD 06/25/22 2046

## 2022-06-26 DIAGNOSIS — R569 Unspecified convulsions: Secondary | ICD-10-CM | POA: Diagnosis not present

## 2022-06-26 LAB — URINALYSIS, ROUTINE W REFLEX MICROSCOPIC
Bacteria, UA: NONE SEEN
Bilirubin Urine: NEGATIVE
Glucose, UA: NEGATIVE mg/dL
Ketones, ur: NEGATIVE mg/dL
Leukocytes,Ua: NEGATIVE
Nitrite: NEGATIVE
Protein, ur: NEGATIVE mg/dL
Specific Gravity, Urine: 1.006 (ref 1.005–1.030)
pH: 6 (ref 5.0–8.0)

## 2022-06-26 LAB — RAPID URINE DRUG SCREEN, HOSP PERFORMED
Amphetamines: NOT DETECTED
Barbiturates: NOT DETECTED
Benzodiazepines: NOT DETECTED
Cocaine: NOT DETECTED
Opiates: NOT DETECTED
Tetrahydrocannabinol: NOT DETECTED

## 2022-06-26 LAB — CBC
HCT: 21.6 % — ABNORMAL LOW (ref 36.0–46.0)
Hemoglobin: 7.1 g/dL — ABNORMAL LOW (ref 12.0–15.0)
MCH: 31 pg (ref 26.0–34.0)
MCHC: 32.9 g/dL (ref 30.0–36.0)
MCV: 94.3 fL (ref 80.0–100.0)
Platelets: 277 10*3/uL (ref 150–400)
RBC: 2.29 MIL/uL — ABNORMAL LOW (ref 3.87–5.11)
RDW: 15.6 % — ABNORMAL HIGH (ref 11.5–15.5)
WBC: 7.7 10*3/uL (ref 4.0–10.5)
nRBC: 0 % (ref 0.0–0.2)

## 2022-06-26 LAB — COMPREHENSIVE METABOLIC PANEL
ALT: 24 U/L (ref 0–44)
AST: 39 U/L (ref 15–41)
Albumin: 2.5 g/dL — ABNORMAL LOW (ref 3.5–5.0)
Alkaline Phosphatase: 72 U/L (ref 38–126)
Anion gap: 8 (ref 5–15)
BUN: <5 mg/dL — ABNORMAL LOW (ref 6–20)
CO2: 20 mmol/L — ABNORMAL LOW (ref 22–32)
Calcium: 7.6 mg/dL — ABNORMAL LOW (ref 8.9–10.3)
Chloride: 108 mmol/L (ref 98–111)
Creatinine, Ser: 0.88 mg/dL (ref 0.44–1.00)
GFR, Estimated: >60 mL/min (ref >60)
Glucose, Bld: 100 mg/dL — ABNORMAL HIGH (ref 70–99)
Potassium: 3.6 mmol/L (ref 3.5–5.1)
Sodium: 136 mmol/L (ref 135–145)
Total Bilirubin: 0.7 mg/dL (ref 0.3–1.2)
Total Protein: 5.6 g/dL — ABNORMAL LOW (ref 6.5–8.1)

## 2022-06-26 LAB — PHOSPHORUS: Phosphorus: 3.1 mg/dL (ref 2.5–4.6)

## 2022-06-26 LAB — BPAM RBC
Blood Product Expiration Date: 202405292359
Unit Type and Rh: 1700
Unit Type and Rh: 1700

## 2022-06-26 LAB — PREPARE RBC (CROSSMATCH)

## 2022-06-26 LAB — TYPE AND SCREEN: Unit division: 0

## 2022-06-26 LAB — MAGNESIUM: Magnesium: 1.9 mg/dL (ref 1.7–2.4)

## 2022-06-26 MED ORDER — PANTOPRAZOLE SODIUM 40 MG IV SOLR: 40.0000 mg | Freq: Two times a day (BID) | INTRAVENOUS | Status: DC

## 2022-06-26 MED ORDER — SODIUM CHLORIDE 0.9% IV SOLUTION: Freq: Once | INTRAVENOUS | Status: DC

## 2022-06-26 MED ORDER — PANTOPRAZOLE INFUSION (NEW) - SIMPLE MED
8.0000 mg/h | INTRAVENOUS | Status: AC
  Administered 2022-06-26 – 2022-06-28 (×7): 8 mg/h via INTRAVENOUS
  Filled 2022-06-26 (×8): qty 100

## 2022-06-26 MED ORDER — PANTOPRAZOLE 80MG IVPB - SIMPLE MED
80.0000 mg | Freq: Once | INTRAVENOUS | Status: AC
  Administered 2022-06-26: 80 mg via INTRAVENOUS
  Filled 2022-06-26: qty 100

## 2022-06-26 NOTE — Progress Notes (Signed)
PROGRESS NOTE    Christina Byrd  ZOX:[id number] DOB: 12-25-1965 DOA: 06/25/2022 PCP: Grayce Sessions, NP    Chief Complaint  Patient presents with   Seizures    Brief Narrative:    YISEL SCHOOLMAN is a 57 y.o. female with medical history significant for alcohol abuse drinks 2 beers and 4 shots of hard liquor per night, seizure disorder, history of hematemesis status post EGD on 06/02/2022 which revealed angiodysplasia of stomach and duodenum with bleeding, duodenal ulcer with recommendation to stop NSAIDs and to take p.o. PPI x 8 weeks, CKD 3B, anemia of chronic disease/iron deficiency anemia, folic acid deficiency, hyperlipidemia, GERD, who presented to [facility] ED from home after 2 witnessed seizure activities by a family member.  Christina Byrd was brought in by family member due to back-to-back seizures at home.  Christina Byrd reports Christina Byrd has been compliant with Christina Byrd home AEDs with ongoing alcohol use.   Upon ER assessment, the patient was postictal and responsive to pain only.  Last alcoholic intake was last night.  States Christina Byrd also took Christina Byrd Keppra medication.  In the ED, Tmax 99.1, tachycardic with heart rate in the 130s.  Chest x-ray with no significant findings.   The patient received 2 L IV fluid boluses NS and a loading dose of IV Keppra.  EDP discussed the case with neurology who recommended admission for further workup.   Admitted by [facility], hospitalist service, to [facility] telemetry medical unit as inpatient status.  The patient is at high risk for alcohol withdrawal.   At the time of this visit Christina Byrd is no longer postictal and is able to respond to questions appropriately.  Christina Byrd is in the room accompanied by Christina Byrd son at bedside.   ED Course: Tmax 99.1.  BP 123/78, pulse 111, respiration rate 17, O2 saturation 96% on room air.  Lab studies remarkable for serum bicarb less than 7, creatinine 1.29, magnesium 1.5, albumin 3.1, AST 59, ALT 29.  GFR 49.  CPK 84.  WBC 10.7.  Hemoglobin 8.5, at  baseline.  Platelet count 365.  Assessment & Plan:   Principal Problem:   Seizure (HCC)    Witnessed seizure activities in the setting of seizure disorder and alcohol abuse -As discussed with Christina Byrd husband by phone, Christina Byrd has not been taking any of Christina Byrd seizure meds over last few days, as well Christina Byrd cannot recall what kind of seizure medicine Christina Byrd is taking despite Christina Byrd emphasizing that Christina Byrd has been compliant. -Logen but greatly appreciated, continue with Keppra 100 mg p.o. twice daily -Keppra level will be unhelpful as it was obtained after Christina Byrd received loading dose  Severe metabolic acidosis with serum bicarb of less than 7 Suspect related to seizing Improved this morning  Alcohol abuse with concern for alcohol withdrawal CIWA protocol in place Alcohol cessation counseling done at bedside [facility] consulted to assist with providing resources Continue multivitamin, thiamine and folic acid supplements.   Anemia of chronic disease/iron deficiency anemia Neck blood loss anemia History of angiodysplasia of stomach and duodenum History of duodenal ulcer in the setting of NSAID use Advised to complete NSAID use cessation. GI recommended p.o. PPI twice daily x 8 weeks from 06/02/2022 -Hemoglobin this morning low at 7.1, by discussing with Christina Byrd husband, apparently Christina Byrd has not been taking any of Christina Byrd PPIs for last few days, Christina Byrd has been still taking Christina Byrd aspirin. -Continue to hold aspirin. -Will transfuse 2 units PRBC. -Continue with Protonix drip over next 24 hours -If these measures does  not stabilize Christina Byrd hemoglobin count over last 24 to 48 hours then will consult GI   Hyperlipidemia Resume home Lipitor and fenofibrate   CKD 3B Appears to be at Christina Byrd baseline creatinine Avoid nephrotoxic agents, dehydration and hypotension Start LR at 50 cc/h x 2 days Monitor urine output Repeat BMP normal   Hypomagnesemia Magnesium 1.5 Repleted intravenously with 2 g of IV magnesium.   Hypoalbuminemia in the  setting of alcohol abuse Isolated elevated AST, 2:1 ratio with ALT in the setting of alcohol abuse Albumin 3.1 Monitor for now.   DVT prophylaxis: SCD Code Status: Full Family Communication: D/W husband by phone Disposition:   Status is: Inpatient    Consultants:  None   Subjective:  No significant events overnight as discussed with staff, Christina Byrd denies any complaints today  Objective: Vitals:   06/25/22 2200 06/26/22 0324 06/26/22 1009 06/26/22 1023  BP:  120/78 113/77 99/63  Pulse:  99 86   Resp:  17 16   Temp: 98 F (36.7 C) 97.8 F (36.6 C)  99.1 F (37.3 C)  TempSrc: Oral   Oral  SpO2:  98%    Weight:      Height:        Intake/Output Summary (Last 24 hours) at 06/26/2022 1127 Last data filed at 06/26/2022 0100 Gross per 24 hour  Intake 136.24 ml  Output --  Net 136.24 ml   Filed Weights   06/25/22 1807  Weight: 56.8 kg    Examination:  Awake Alert, Oriented X 3, No new F.N deficits, Normal affect Symmetrical Chest wall movement, Good air movement bilaterally, CTAB RRR,No Gallops,Rubs or new Murmurs, No Parasternal Heave +ve B.Sounds, Abd Soft, No tenderness, No rebound - guarding or rigidity. No Cyanosis, Clubbing or edema, No new Rash or bruise      Data Reviewed: I have personally reviewed following labs and imaging studies  CBC: Recent Labs  Lab 06/25/22 1750 06/26/22 0413  WBC 10.7* 7.7  NEUTROABS 4.7  --   HGB 8.5* 7.1*  HCT 27.8* 21.6*  MCV 101.5* 94.3  PLT 365 277    Basic Metabolic Panel: Recent Labs  Lab 06/25/22 1750 06/25/22 1800 06/26/22 0257  NA 141  --  136  K 3.5  --  3.6  CL 108  --  108  CO2 <7*  --  20*  GLUCOSE 91  --  100*  BUN 5*  --  <5*  CREATININE 1.29*  --  0.88  CALCIUM 8.3*  --  7.6*  MG  --  1.5* 1.9  PHOS  --   --  3.1    GFR: Estimated Creatinine Clearance: 56.5 mL/min (by C-G formula based on SCr of 0.88 mg/dL).  Liver Function Tests: Recent Labs  Lab 06/25/22 1750 06/26/22 0257  AST 59*  39  ALT 29 24  ALKPHOS 80 72  BILITOT 0.3 0.7  PROT 6.8 5.6*  ALBUMIN 3.1* 2.5*    CBG: Recent Labs  Lab 06/25/22 1803  GLUCAP 71     No results found for this or any previous visit (from the past 240 hour(s)).       Radiology Studies: DG CHEST PORT 1 VIEW  Result Date: 06/25/2022 CLINICAL DATA:  Seizure, altered mental status EXAM: PORTABLE CHEST 1 VIEW COMPARISON:  06/02/2022 FINDINGS: The heart size and mediastinal contours are within normal limits. Both lungs are clear. The visualized skeletal structures are unremarkable. IMPRESSION: No active disease. Electronically Signed   By: Lyda Kalata.D.  On: 06/25/2022 21:03   CT Head Wo Contrast  Result Date: 06/25/2022 CLINICAL DATA:  Seizures, altered level of consciousness EXAM: CT HEAD WITHOUT CONTRAST TECHNIQUE: Contiguous axial images were obtained from the base of the skull through the vertex without intravenous contrast. RADIATION DOSE REDUCTION: This exam was performed according to the departmental dose-optimization program which includes automated exposure control, adjustment of the mA and/or kV according to patient size and/or use of iterative reconstruction technique. COMPARISON:  09/06/2017 FINDINGS: Brain: Chronic areas of cortical encephalomalacia are seen within the left frontal and left occipital lobes. No evidence of acute infarct or hemorrhage. The lateral ventricles and midline structures are unremarkable. No acute extra-axial fluid collections. No mass effect. Vascular: No hyperdense vessel or unexpected calcification. Skull: Normal. Negative for fracture or focal lesion. Sinuses/Orbits: No acute finding. Other: None. IMPRESSION: 1. Stable head CT, no acute intracranial process. Electronically Signed   By: Sharlet Salina M.D.   On: 06/25/2022 19:15        Scheduled Meds:  sodium chloride   Intravenous Once   atorvastatin  20 mg Oral Daily   cyanocobalamin  1,000 mcg Oral Daily   fenofibrate  54 mg Oral  Daily   ferrous sulfate  325 mg Oral BID WC   folic acid  1 mg Oral Daily   levETIRAcetam  500 mg Oral Q12H   multivitamin with minerals  1 tablet Oral Daily   omega-3 acid ethyl esters  2 g Oral BID   pantoprazole  40 mg Oral BID AC   [START ON 06/29/2022] pantoprazole  40 mg Intravenous Q12H   thiamine  100 mg Oral Daily   Or   thiamine  100 mg Intravenous Daily   venlafaxine XR  37.5 mg Oral Q breakfast   Continuous Infusions:  lactated ringers 50 mL/hr at 06/25/22 2213   levETIRAcetam     pantoprazole 8 mg/hr (06/26/22 0940)     LOS: 1 day       Huey Bienenstock, MD [facility]   To contact the attending provider between 7A-7P or the covering provider during after hours 7P-7A, please log into the web site [email or web address] and access using universal [place] password for that web site. If you do not have the password, please call the hospital operator.  06/26/2022, 11:27 AM

## 2022-06-26 NOTE — Progress Notes (Signed)
Attempted to reach EEG dept 2 X, no answer. Left voice message.

## 2022-06-27 DIAGNOSIS — F10931 Alcohol use, unspecified with withdrawal delirium: Secondary | ICD-10-CM | POA: Diagnosis not present

## 2022-06-27 DIAGNOSIS — D500 Iron deficiency anemia secondary to blood loss (chronic): Secondary | ICD-10-CM | POA: Diagnosis not present

## 2022-06-27 DIAGNOSIS — R569 Unspecified convulsions: Secondary | ICD-10-CM | POA: Diagnosis not present

## 2022-06-27 LAB — BASIC METABOLIC PANEL
Anion gap: 6 (ref 5–15)
BUN: <5 mg/dL — ABNORMAL LOW (ref 6–20)
CO2: 23 mmol/L (ref 22–32)
Calcium: 8.2 mg/dL — ABNORMAL LOW (ref 8.9–10.3)
Chloride: 107 mmol/L (ref 98–111)
Creatinine, Ser: 0.98 mg/dL (ref 0.44–1.00)
GFR, Estimated: >60 mL/min (ref >60)
Glucose, Bld: 97 mg/dL (ref 70–99)
Potassium: 3.3 mmol/L — ABNORMAL LOW (ref 3.5–5.1)
Sodium: 136 mmol/L (ref 135–145)

## 2022-06-27 LAB — TYPE AND SCREEN
ABO/RH(D): B NEG
Antibody Screen: NEGATIVE
Unit division: 0

## 2022-06-27 LAB — BPAM RBC
Blood Product Expiration Date: 202406062359
ISSUE DATE / TIME: 202405041003
ISSUE DATE / TIME: 202405041326

## 2022-06-27 LAB — CBC
HCT: 30.9 % — ABNORMAL LOW (ref 36.0–46.0)
Hemoglobin: 10.8 g/dL — ABNORMAL LOW (ref 12.0–15.0)
MCH: 30.1 pg (ref 26.0–34.0)
MCHC: 35 g/dL (ref 30.0–36.0)
MCV: 86.1 fL (ref 80.0–100.0)
Platelets: 207 10*3/uL (ref 150–400)
RBC: 3.59 MIL/uL — ABNORMAL LOW (ref 3.87–5.11)
RDW: 17.9 % — ABNORMAL HIGH (ref 11.5–15.5)
WBC: 8.1 10*3/uL (ref 4.0–10.5)
nRBC: 0 % (ref 0.0–0.2)

## 2022-06-27 LAB — MAGNESIUM: Magnesium: 1.4 mg/dL — ABNORMAL LOW (ref 1.7–2.4)

## 2022-06-27 LAB — PHOSPHORUS: Phosphorus: 2.7 mg/dL (ref 2.5–4.6)

## 2022-06-27 MED ORDER — HALOPERIDOL LACTATE 5 MG/ML IJ SOLN: 5.0000 mg | Freq: Once | INTRAMUSCULAR | Status: AC | PRN

## 2022-06-27 MED ORDER — HALOPERIDOL LACTATE 5 MG/ML IJ SOLN
INTRAMUSCULAR | Status: AC
  Administered 2022-06-27: 5 mg via INTRAVENOUS
  Filled 2022-06-27: qty 1

## 2022-06-27 MED ORDER — POTASSIUM CHLORIDE CRYS ER 20 MEQ PO TBCR
40.0000 meq | EXTENDED_RELEASE_TABLET | Freq: Four times a day (QID) | ORAL | Status: AC
  Administered 2022-06-27: 40 meq via ORAL
  Filled 2022-06-27 (×2): qty 2

## 2022-06-27 MED ORDER — MAGNESIUM SULFATE 4 GM/100ML IV SOLN
4.0000 g | Freq: Once | INTRAVENOUS | Status: AC
  Administered 2022-06-27: 4 g via INTRAVENOUS
  Filled 2022-06-27: qty 100

## 2022-06-27 MED ORDER — CHLORDIAZEPOXIDE HCL 5 MG PO CAPS
10.0000 mg | ORAL_CAPSULE | Freq: Four times a day (QID) | ORAL | Status: AC
  Administered 2022-06-27 – 2022-06-28 (×6): 10 mg via ORAL
  Filled 2022-06-27 (×6): qty 2

## 2022-06-27 MED ORDER — CHLORDIAZEPOXIDE HCL 5 MG PO CAPS
10.0000 mg | ORAL_CAPSULE | Freq: Three times a day (TID) | ORAL | Status: DC
  Administered 2022-06-28 – 2022-06-29 (×2): 10 mg via ORAL
  Filled 2022-06-27 (×2): qty 2

## 2022-06-27 NOTE — Progress Notes (Signed)
PROGRESS NOTE    Christina Byrd  UJW:[id number] DOB: 23-May-1965 DOA: 06/25/2022 PCP: Grayce Sessions, NP    Chief Complaint  Patient presents with   Seizures    Brief Narrative:    LORECE GARGUS is a 57 y.o. female with medical history significant for alcohol abuse drinks 2 beers and 4 shots of hard liquor per night, seizure disorder, history of hematemesis status post EGD on 06/02/2022 which revealed angiodysplasia of stomach and duodenum with bleeding, duodenal ulcer with recommendation to stop NSAIDs and to take p.o. PPI x 8 weeks, CKD 3B, anemia of chronic disease/iron deficiency anemia, folic acid deficiency, hyperlipidemia, GERD, who presented to [facility] ED from home after 2 witnessed seizure activities by a family member.  She was brought in by family member due to back-to-back seizures at home.  She reports she has been compliant with her home AEDs with ongoing alcohol use.   Upon ER assessment, the patient was postictal and responsive to pain only.  Last alcoholic intake was night before admission, apparently as discussed with husband, patient has not been taking any of her meds for last 3 to 4 days prior to admission in the setting of heavy alcohol abuse, workup significant for anemia, requiring PRBC transfusion Juliane compliant with Protonix, as well 5/5 AM she was noted to have agitation and restlessness due to alcohol withdrawals.     Assessment & Plan:   Principal Problem:   Seizure (HCC)    Witnessed seizure activities in the setting of seizure disorder and alcohol abuse -As discussed with her husband by phone, she has not been taking any of her seizure meds over last few days, as well she cannot recall what kind of seizure medicine she is taking despite her emphasizing that she has been compliant. -neurology input  greatly appreciated, continue with Keppra 500 mg p.o. twice daily -Keppra level will be unhelpful as it was obtained after she received loading  dose  Severe metabolic acidosis with serum bicarb of less than 7 Suspect related to seizing Improved   Alcohol abuse with alcohol withdrawals CIWA protocol in place, patient with significant agitation and delirium overnight, due to alcohol withdrawals, continue with CIWA protocol, did start on scheduled Librium as well Alcohol cessation counseling done at bedside [facility] consulted to assist with providing resources Continue multivitamin, thiamine and folic acid supplements.   Anemia of chronic disease/iron deficiency anemia Neck blood loss anemia History of angiodysplasia of stomach and duodenum History of duodenal ulcer in the setting of NSAID use Advised to complete NSAID use cessation. GI recommended p.o. PPI twice daily x 8 weeks from 06/02/2022 -Hemoglobin low at 7.1, by discussing with her husband, apparently she has not been taking any of her PPIs for last few days, she has been still taking her aspirin. -Continue to hold aspirin. -Received 2 units PRBC, continue to monitor CBC closely. -Continue with Protonix drip over next 24 hours -If these measures does not stabilize her hemoglobin count over last 24 to 48 hours then will consult GI   Hyperlipidemia Resume home Lipitor and fenofibrate   CKD 3B Appears to be at her baseline creatinine Avoid nephrotoxic agents, dehydration and hypotension Start LR at 50 cc/h x 2 days Monitor urine output Repeat BMP normal   Hypomagnesemia Hypokalemia Replaced, monitor closely, recheck in a.m.  Hypoalbuminemia in the setting of alcohol abuse Isolated elevated AST, 2:1 ratio with ALT in the setting of alcohol abuse Albumin 3.1 Monitor for now.   DVT prophylaxis:  SCD Code Status: Full Family Communication: D/W husband by at bedside Disposition:   Status is: Inpatient    Consultants:  None   Subjective:  She is with episode and agitation and restlessness overnight requiring as needed Haldol and Ativan  Objective: Vitals:    06/27/22 0800 06/27/22 0842 06/27/22 0848 06/27/22 1149  BP: (!) 155/128 (!) 155/128  103/71  Pulse: 78  78 76  Resp: 15  13 12   Temp:  98.1 F (36.7 C)  97.6 F (36.4 C)  TempSrc:    Axillary  SpO2:   98% 96%  Weight:      Height:        Intake/Output Summary (Last 24 hours) at 06/27/2022 1317 Last data filed at 06/27/2022 1125 Gross per 24 hour  Intake 2108.98 ml  Output --  Net 2108.98 ml   Filed Weights   06/25/22 1807  Weight: 56.8 kg    Examination:  She is more confused and restless today, but in no apparent distress Symmetrical Chest wall movement, Good air movement bilaterally, CTAB RRR,No Gallops,Rubs or new Murmurs, No Parasternal Heave +ve B.Sounds, Abd Soft, No tenderness, No rebound - guarding or rigidity. No Cyanosis, Clubbing or edema, No new Rash or bruise      Data Reviewed: I have personally reviewed following labs and imaging studies  CBC: Recent Labs  Lab 06/25/22 1750 06/26/22 0413 06/27/22 0315  WBC 10.7* 7.7 8.1  NEUTROABS 4.7  --   --   HGB 8.5* 7.1* 10.8*  HCT 27.8* 21.6* 30.9*  MCV 101.5* 94.3 86.1  PLT 365 277 207    Basic Metabolic Panel: Recent Labs  Lab 06/25/22 1750 06/25/22 1800 06/26/22 0257 06/27/22 0315  NA 141  --  136 136  K 3.5  --  3.6 3.3*  CL 108  --  108 107  CO2 <7*  --  20* 23  GLUCOSE 91  --  100* 97  BUN 5*  --  <5* <5*  CREATININE 1.29*  --  0.88 0.98  CALCIUM 8.3*  --  7.6* 8.2*  MG  --  1.5* 1.9 1.4*  PHOS  --   --  3.1 2.7    GFR: Estimated Creatinine Clearance: 50.7 mL/min (by C-G formula based on SCr of 0.98 mg/dL).  Liver Function Tests: Recent Labs  Lab 06/25/22 1750 06/26/22 0257  AST 59* 39  ALT 29 24  ALKPHOS 80 72  BILITOT 0.3 0.7  PROT 6.8 5.6*  ALBUMIN 3.1* 2.5*    CBG: Recent Labs  Lab 06/25/22 1803  GLUCAP 71     No results found for this or any previous visit (from the past 240 hour(s)).       Radiology Studies: DG CHEST PORT 1 VIEW  Result Date:  06/25/2022 CLINICAL DATA:  Seizure, altered mental status EXAM: PORTABLE CHEST 1 VIEW COMPARISON:  06/02/2022 FINDINGS: The heart size and mediastinal contours are within normal limits. Both lungs are clear. The visualized skeletal structures are unremarkable. IMPRESSION: No active disease. Electronically Signed   By: Helyn Numbers M.D.   On: 06/25/2022 21:03   CT Head Wo Contrast  Result Date: 06/25/2022 CLINICAL DATA:  Seizures, altered level of consciousness EXAM: CT HEAD WITHOUT CONTRAST TECHNIQUE: Contiguous axial images were obtained from the base of the skull through the vertex without intravenous contrast. RADIATION DOSE REDUCTION: This exam was performed according to the departmental dose-optimization program which includes automated exposure control, adjustment of the mA and/or kV according to patient size  and/or use of iterative reconstruction technique. COMPARISON:  09/06/2017 FINDINGS: Brain: Chronic areas of cortical encephalomalacia are seen within the left frontal and left occipital lobes. No evidence of acute infarct or hemorrhage. The lateral ventricles and midline structures are unremarkable. No acute extra-axial fluid collections. No mass effect. Vascular: No hyperdense vessel or unexpected calcification. Skull: Normal. Negative for fracture or focal lesion. Sinuses/Orbits: No acute finding. Other: None. IMPRESSION: 1. Stable head CT, no acute intracranial process. Electronically Signed   By: Sharlet Salina M.D.   On: 06/25/2022 19:15        Scheduled Meds:  sodium chloride   Intravenous Once   atorvastatin  20 mg Oral Daily   chlordiazePOXIDE  10 mg Oral QID   Followed by   Melene Muller ON 06/28/2022] chlordiazePOXIDE  10 mg Oral TID   cyanocobalamin  1,000 mcg Oral Daily   fenofibrate  54 mg Oral Daily   ferrous sulfate  325 mg Oral BID WC   folic acid  1 mg Oral Daily   levETIRAcetam  500 mg Oral Q12H   multivitamin with minerals  1 tablet Oral Daily   omega-3 acid ethyl esters  2  g Oral BID   [START ON 06/29/2022] pantoprazole  40 mg Intravenous Q12H   potassium chloride  40 mEq Oral Q6H   thiamine  100 mg Oral Daily   Or   thiamine  100 mg Intravenous Daily   venlafaxine XR  37.5 mg Oral Q breakfast   Continuous Infusions:  lactated ringers 50 mL/hr at 06/27/22 0855   levETIRAcetam     pantoprazole 8 mg/hr (06/27/22 1125)     LOS: 2 days       Huey Bienenstock, MD [facility]   To contact the attending provider between 7A-7P or the covering provider during after hours 7P-7A, please log into the web site [email or web address] and access using universal [place] password for that web site. If you do not have the password, please call the hospital operator.  06/27/2022, 1:17 PM

## 2022-06-27 NOTE — Progress Notes (Signed)
Pt has become severely agitated and is striking at staff. Plan to give a dose Haldol and use soft restraints with frequent reassessment and removal as soon as safe to do so.

## 2022-06-28 DIAGNOSIS — R569 Unspecified convulsions: Secondary | ICD-10-CM | POA: Diagnosis not present

## 2022-06-28 LAB — CBC WITH DIFFERENTIAL/PLATELET
Abs Immature Granulocytes: 0.02 10*3/uL (ref 0.00–0.07)
Basophils Absolute: 0 10*3/uL (ref 0.0–0.1)
Basophils Relative: 0 %
Eosinophils Absolute: 0.1 10*3/uL (ref 0.0–0.5)
Eosinophils Relative: 1 %
HCT: 34.1 % — ABNORMAL LOW (ref 36.0–46.0)
Hemoglobin: 11.1 g/dL — ABNORMAL LOW (ref 12.0–15.0)
Immature Granulocytes: 0 %
Lymphocytes Relative: 18 %
Lymphs Abs: 1.7 10*3/uL (ref 0.7–4.0)
MCH: 29.1 pg (ref 26.0–34.0)
MCHC: 32.6 g/dL (ref 30.0–36.0)
MCV: 89.3 fL (ref 80.0–100.0)
Monocytes Absolute: 0.5 10*3/uL (ref 0.1–1.0)
Monocytes Relative: 5 %
Neutro Abs: 7.3 10*3/uL (ref 1.7–7.7)
Neutrophils Relative %: 76 %
Platelets: 202 10*3/uL (ref 150–400)
RBC: 3.82 MIL/uL — ABNORMAL LOW (ref 3.87–5.11)
RDW: 17.7 % — ABNORMAL HIGH (ref 11.5–15.5)
WBC: 9.5 10*3/uL (ref 4.0–10.5)
nRBC: 0 % (ref 0.0–0.2)

## 2022-06-28 LAB — PHOSPHORUS: Phosphorus: 3.3 mg/dL (ref 2.5–4.6)

## 2022-06-28 LAB — BASIC METABOLIC PANEL
Anion gap: 8 (ref 5–15)
BUN: <5 mg/dL — ABNORMAL LOW (ref 6–20)
CO2: 21 mmol/L — ABNORMAL LOW (ref 22–32)
Calcium: 8 mg/dL — ABNORMAL LOW (ref 8.9–10.3)
Chloride: 106 mmol/L (ref 98–111)
Creatinine, Ser: 1.13 mg/dL — ABNORMAL HIGH (ref 0.44–1.00)
GFR, Estimated: 57 mL/min — ABNORMAL LOW (ref >60)
Glucose, Bld: 91 mg/dL (ref 70–99)
Potassium: 3.9 mmol/L (ref 3.5–5.1)
Sodium: 135 mmol/L (ref 135–145)

## 2022-06-28 LAB — LEVETIRACETAM LEVEL: Levetiracetam Lvl: 54.6 ug/mL — ABNORMAL HIGH (ref 10.0–40.0)

## 2022-06-28 LAB — MAGNESIUM: Magnesium: 2.6 mg/dL — ABNORMAL HIGH (ref 1.7–2.4)

## 2022-06-28 MED ORDER — PANTOPRAZOLE SODIUM 40 MG PO TBEC: 40.0000 mg | DELAYED_RELEASE_TABLET | Freq: Two times a day (BID) | ORAL | Status: DC

## 2022-06-28 NOTE — Progress Notes (Signed)
CSW received referral regarding substance use resources. CSW spoke with patient regarding ETOH use. Patient states that she can stop anything if she puts her mind to it and provided an example of how she quit smoking after 20 years of it. CSW offered resources but she declined, stating she only drinks at night and ended up at the hospital this time due to "partying too much at a two-day party". Patient stated she has a PCP but will likely search for a new one. No other needs identified at this time.   Joaquin Courts, MSW, [facility]

## 2022-06-28 NOTE — Progress Notes (Signed)
  Transition of Care [facility]) Screening Note   Patient Details  Name: Christina Byrd Date of Birth: Oct 17, 1965   Transition of Care [facility]) CM/SW Contact:    Mearl Latin, LCSW Phone Number: 06/28/2022, 8:54 AM    Transition of Care Department [facility]) has reviewed patient and will assess for ETOH consult. We will continue to monitor patient advancement through interdisciplinary progression rounds. If new patient transition needs arise, please place a TOC consult.

## 2022-06-28 NOTE — Progress Notes (Signed)
PROGRESS NOTE    Christina Byrd  XBJ:[id number] DOB: October 17, 1965 DOA: 06/25/2022 PCP: Grayce Sessions, NP    Chief Complaint  Patient presents with   Seizures    Brief Narrative:    Christina Byrd is a 57 y.o. female with medical history significant for alcohol abuse drinks 2 beers and 4 shots of hard liquor per night, seizure disorder, history of hematemesis status post EGD on 06/02/2022 which revealed angiodysplasia of stomach and duodenum with bleeding, duodenal ulcer with recommendation to stop NSAIDs and to take p.o. PPI x 8 weeks, CKD 3B, anemia of chronic disease/iron deficiency anemia, folic acid deficiency, hyperlipidemia, GERD, who presented to [facility] ED from home after 2 witnessed seizure activities by a family member.  She was brought in by family member due to back-to-back seizures at home.  She reports she has been compliant with her home AEDs with ongoing alcohol use.   Upon ER assessment, the patient was postictal and responsive to pain only.  Last alcoholic intake was night before admission, apparently as discussed with husband, patient has not been taking any of her meds for last 3 to 4 days prior to admission in the setting of heavy alcohol abuse, workup significant for anemia, requiring PRBC transfusion Juliane compliant with Protonix, as well 5/5 AM she was noted to have agitation and restlessness due to alcohol withdrawals.     Assessment & Plan:   Principal Problem:   Seizure (HCC)    Witnessed seizure activities in the setting of seizure disorder and alcohol abuse -As discussed with her husband by phone, she has not been taking any of her seizure meds over last few days, as well she cannot recall what kind of seizure medicine she is taking despite her emphasizing that she has been compliant. -neurology input  greatly appreciated, continue with Keppra 500 mg p.o. twice daily -Keppra level will be unhelpful as it was obtained after she received loading  dose  Severe metabolic acidosis with serum bicarb of less than 7 Suspect related to seizing Improved   Alcohol abuse with alcohol withdrawals Continue with thiamine, folic acid, multivitamin  -Developed alcohol withdrawals 5/5, started on scheduled Librium, continue with CIWA protocol . TOC consulted to assist with providing resources Continue multivitamin, thiamine and folic acid supplements.   Anemia of chronic disease/iron deficiency anemia Neck blood loss anemia History of angiodysplasia of stomach and duodenum History of duodenal ulcer in the setting of NSAID use Advised to complete NSAID use cessation. GI recommended p.o. PPI twice daily x 8 weeks from 06/02/2022 -Hemoglobin low at 7.1, by discussing with her husband, apparently she has not been taking any of her PPIs for last few days, she has been still taking her aspirin. -Continue to hold aspirin. -Received 2 units PRBC, CBC remained stable -on  IV Protonix, will switch to p.o. today -recheck CBC in am.   Hyperlipidemia Resume home Lipitor and fenofibrate   CKD 3B Appears to be at her baseline creatinine Avoid nephrotoxic agents, dehydration and hypotension Start LR at 50 cc/h x 2 days Monitor urine output Repeat BMP normal   Hypomagnesemia Hypokalemia Replaced, monitor closely, recheck in a.m.  Hypoalbuminemia in the setting of alcohol abuse Isolated elevated AST, 2:1 ratio with ALT in the setting of alcohol abuse Albumin 3.1 Monitor for now.   DVT prophylaxis: SCD Code Status: Full Family Communication: D/W husband by at bedside Disposition:   Status is: Inpatient    Consultants:  None   Subjective:  No  significant events overnight as discussed with staff and husband at bedside today, she denies any complaints today Objective: Vitals:   06/28/22 0600 06/28/22 0605 06/28/22 0800 06/28/22 1141  BP:  115/76  115/76  Pulse: (!) 105 (!) 102    Resp:  15  14  Temp:  97.8 F (36.6 C) 99.2 F (37.3  C) 98.6 F (37 C)  TempSrc:  Oral Oral Oral  SpO2:  99%    Weight:      Height:        Intake/Output Summary (Last 24 hours) at 06/28/2022 1401 Last data filed at 06/28/2022 1000 Gross per 24 hour  Intake 895.02 ml  Output --  Net 895.02 ml   Filed Weights   06/25/22 1807  Weight: 56.8 kg    Examination:  Awake Alert, she is more appropriate and coherent today Symmetrical Chest wall movement, Good air movement bilaterally, CTAB RRR,No Gallops,Rubs or new Murmurs, No Parasternal Heave +ve B.Sounds, Abd Soft, No tenderness, No rebound - guarding or rigidity. No Cyanosis, Clubbing or edema, No new Rash or bruise       Data Reviewed: I have personally reviewed following labs and imaging studies  CBC: Recent Labs  Lab 06/25/22 1750 06/26/22 0413 06/27/22 0315 06/28/22 0312  WBC 10.7* 7.7 8.1 9.5  NEUTROABS 4.7  --   --  7.3  HGB 8.5* 7.1* 10.8* 11.1*  HCT 27.8* 21.6* 30.9* 34.1*  MCV 101.5* 94.3 86.1 89.3  PLT 365 277 207 202    Basic Metabolic Panel: Recent Labs  Lab 06/25/22 1750 06/25/22 1800 06/26/22 0257 06/27/22 0315 06/28/22 0312  NA 141  --  136 136 135  K 3.5  --  3.6 3.3* 3.9  CL 108  --  108 107 106  CO2 <7*  --  20* 23 21*  GLUCOSE 91  --  100* 97 91  BUN 5*  --  <5* <5* <5*  CREATININE 1.29*  --  0.88 0.98 1.13*  CALCIUM 8.3*  --  7.6* 8.2* 8.0*  MG  --  1.5* 1.9 1.4* 2.6*  PHOS  --   --  3.1 2.7 3.3    GFR: Estimated Creatinine Clearance: 44 mL/min (A) (by C-G formula based on SCr of 1.13 mg/dL (H)).  Liver Function Tests: Recent Labs  Lab 06/25/22 1750 06/26/22 0257  AST 59* 39  ALT 29 24  ALKPHOS 80 72  BILITOT 0.3 0.7  PROT 6.8 5.6*  ALBUMIN 3.1* 2.5*    CBG: Recent Labs  Lab 06/25/22 1803  GLUCAP 71     No results found for this or any previous visit (from the past 240 hour(s)).       Radiology Studies: No results found.      Scheduled Meds:  sodium chloride   Intravenous Once   atorvastatin  20 mg  Oral Daily   chlordiazePOXIDE  10 mg Oral QID   Followed by   chlordiazePOXIDE  10 mg Oral TID   cyanocobalamin  1,000 mcg Oral Daily   fenofibrate  54 mg Oral Daily   ferrous sulfate  325 mg Oral BID WC   folic acid  1 mg Oral Daily   levETIRAcetam  500 mg Oral Q12H   multivitamin with minerals  1 tablet Oral Daily   omega-3 acid ethyl esters  2 g Oral BID   [START ON 06/29/2022] pantoprazole  40 mg Oral BID AC   thiamine  100 mg Oral Daily   Or   thiamine  100 mg Intravenous Daily   venlafaxine XR  37.5 mg Oral Q breakfast   Continuous Infusions:  levETIRAcetam     pantoprazole 8 mg/hr (06/28/22 0610)     LOS: 3 days       Huey Bienenstock, MD [facility]   To contact the attending provider between 7A-7P or the covering provider during after hours 7P-7A, please log into the web site [email or web address] and access using universal [place] password for that web site. If you do not have the password, please call the hospital operator.  06/28/2022, 2:01 PM

## 2022-06-29 ENCOUNTER — Other Ambulatory Visit (HOSPITAL_COMMUNITY): Payer: Self-pay

## 2022-06-29 DIAGNOSIS — R569 Unspecified convulsions: Secondary | ICD-10-CM | POA: Diagnosis not present

## 2022-06-29 DIAGNOSIS — F1029 Alcohol dependence with unspecified alcohol-induced disorder: Secondary | ICD-10-CM | POA: Diagnosis not present

## 2022-06-29 LAB — CBC WITH DIFFERENTIAL/PLATELET
Abs Immature Granulocytes: 0.03 10*3/uL (ref 0.00–0.07)
Basophils Absolute: 0 10*3/uL (ref 0.0–0.1)
Basophils Relative: 0 %
Eosinophils Absolute: 0.1 10*3/uL (ref 0.0–0.5)
Eosinophils Relative: 0 %
HCT: 34.2 % — ABNORMAL LOW (ref 36.0–46.0)
Hemoglobin: 11.7 g/dL — ABNORMAL LOW (ref 12.0–15.0)
Immature Granulocytes: 0 %
Lymphocytes Relative: 13 %
Lymphs Abs: 1.5 10*3/uL (ref 0.7–4.0)
MCH: 30 pg (ref 26.0–34.0)
MCHC: 34.2 g/dL (ref 30.0–36.0)
MCV: 87.7 fL (ref 80.0–100.0)
Monocytes Absolute: 0.6 10*3/uL (ref 0.1–1.0)
Monocytes Relative: 5 %
Neutro Abs: 9.6 10*3/uL — ABNORMAL HIGH (ref 1.7–7.7)
Neutrophils Relative %: 82 %
Platelets: 196 10*3/uL (ref 150–400)
RBC: 3.9 MIL/uL (ref 3.87–5.11)
RDW: 17.4 % — ABNORMAL HIGH (ref 11.5–15.5)
WBC: 11.8 10*3/uL — ABNORMAL HIGH (ref 4.0–10.5)
nRBC: 0 % (ref 0.0–0.2)

## 2022-06-29 LAB — BASIC METABOLIC PANEL
Anion gap: 6 (ref 5–15)
BUN: 5 mg/dL — ABNORMAL LOW (ref 6–20)
CO2: 21 mmol/L — ABNORMAL LOW (ref 22–32)
Calcium: 8.1 mg/dL — ABNORMAL LOW (ref 8.9–10.3)
Chloride: 106 mmol/L (ref 98–111)
Creatinine, Ser: 0.99 mg/dL (ref 0.44–1.00)
GFR, Estimated: >60 mL/min (ref >60)
Glucose, Bld: 93 mg/dL (ref 70–99)
Potassium: 3.9 mmol/L (ref 3.5–5.1)
Sodium: 133 mmol/L — ABNORMAL LOW (ref 135–145)

## 2022-06-29 LAB — MAGNESIUM: Magnesium: 1.9 mg/dL (ref 1.7–2.4)

## 2022-06-29 LAB — PHOSPHORUS: Phosphorus: 2.9 mg/dL (ref 2.5–4.6)

## 2022-06-29 MED ORDER — CHLORDIAZEPOXIDE HCL 10 MG PO CAPS
ORAL_CAPSULE | ORAL | 0 refills | Status: DC
  Filled 2022-06-29: qty 18, 9d supply, fill #0

## 2022-06-29 MED ORDER — LEVETIRACETAM 500 MG PO TABS
500.0000 mg | ORAL_TABLET | Freq: Two times a day (BID) | ORAL | 0 refills | Status: DC
Start: 2022-06-29 — End: 2022-07-13
  Filled 2022-06-29: qty 60, 30d supply, fill #0

## 2022-06-29 NOTE — Discharge Instructions (Signed)
Follow with Primary MD Edwards, Michelle P, NP in 7 days   Get CBC, CMP,  checked  by Primary MD next visit.    Activity: As tolerated with Full fall precautions use walker/cane & assistance as needed   Disposition Home   Diet: Heart Healthy   On your next visit with your primary care physician please Get Medicines reviewed and adjusted.   Please request your Prim.MD to go over all Hospital Tests and Procedure/Radiological results at the follow up, please get all Hospital records sent to your Prim MD by signing hospital release before you go home.   If you experience worsening of your admission symptoms, develop shortness of breath, life threatening emergency, suicidal or homicidal thoughts you must seek medical attention immediately by calling 911 or calling your MD immediately  if symptoms less severe.  You Must read complete instructions/literature along with all the possible adverse reactions/side effects for all the Medicines you take and that have been prescribed to you. Take any new Medicines after you have completely understood and accpet all the possible adverse reactions/side effects.   Do not drive, operating heavy machinery, perform activities at heights, swimming or participation in water activities or provide baby sitting services if your were admitted for syncope or siezures until you have seen by Primary MD or a Neurologist and advised to do so again.  Do not drive when taking Pain medications.    Do not take more than prescribed Pain, Sleep and Anxiety Medications  Special Instructions: If you have smoked or chewed Tobacco  in the last 2 yrs please stop smoking, stop any regular Alcohol  and or any Recreational drug use.  Wear Seat belts while driving.   Please note  You were cared for by a hospitalist during your hospital stay. If you have any questions about your discharge medications or the care you received while you were in the hospital after you are  discharged, you can call the unit and asked to speak with the hospitalist on call if the hospitalist that took care of you is not available. Once you are discharged, your primary care physician will handle any further medical issues. Please note that NO REFILLS for any discharge medications will be authorized once you are discharged, as it is imperative that you return to your primary care physician (or establish a relationship with a primary care physician if you do not have one) for your aftercare needs so that they can reassess your need for medications and monitor your lab values. 

## 2022-06-29 NOTE — Discharge Summary (Signed)
Physician Discharge Summary  Christina Byrd:[id number] DOB: 1965/11/19 DOA: 06/25/2022  PCP: Grayce Sessions, NP  Admit date: 06/25/2022 Discharge date: 06/29/2022   Recommendations for Outpatient Follow-up:  Follow up with PCP in 1-2 weeks Please obtain BMP/CBC in one week Please continue counseling about alcohol abuse    Discharge Condition:Stable Diet recommendation: Heart Healthy   Brief/Interim Summary:  Christina Byrd is a 57 y.o. female with medical history significant for alcohol abuse drinks 2 beers and 4 shots of hard liquor per night, seizure disorder, history of hematemesis status post EGD on 06/02/2022 which revealed angiodysplasia of stomach and duodenum with bleeding, duodenal ulcer with recommendation to stop NSAIDs and to take p.o. PPI x 8 weeks, CKD 3B, anemia of chronic disease/iron deficiency anemia, folic acid deficiency, hyperlipidemia, GERD, who presented to [facility] ED from home after 2 witnessed seizure activities by a family member.  She was brought in by family member due to back-to-back seizures at home.  Upon ER assessment, the patient was postictal and responsive to pain only.  Last alcoholic intake was night before admission, apparently as discussed with husband, patient has not been taking any of her meds for last 3 to 4 days prior to admission in the setting of heavy alcohol abuse, workup significant for anemia, requiring PRBC transfusion , she has not been  compliant with Protonix, as well 5/5 AM she was noted to have agitation and restlessness due to alcohol withdrawals.      Witnessed seizure activities in the setting of seizure disorder and alcohol abuse and noncompliance with medications. -As discussed with her husband, she has not been taking any of her seizure meds ( or any other meds) over last few days. -neurology input  greatly appreciated, continue with Keppra 500 mg p.o. twice daily(she will be given 1 month of extra supply upon  discharge) -Keppra level will be unhelpful as it was obtained after she received loading dose   Severe metabolic acidosis with serum bicarb of less than 7 Suspect related to seizing Improved    Alcohol abuse with alcohol withdrawals Continue with thiamine, folic acid, multivitamin  -Developed alcohol withdrawals 5/5, started on scheduled Librium, was on CIWA protocol during hospital stay, she will be discharged on Librium taper as discussed with her and husband. TOC consulted to assist with providing resources    Anemia of chronic disease/iron deficiency anemia Chronic  blood loss anemia History of angiodysplasia of stomach and duodenum History of duodenal ulcer in the setting of NSAID use Advised to complete NSAID use cessation. GI recommended p.o. PPI twice daily x 8 weeks from 06/02/2022 during recent hospitalization -Hemoglobin low at 7.1, by discussing with her husband, apparently she has not been taking any of her PPIs for last few days. -Received 2 units PRBC, CBC remained stable -on  IV Protonix initially, this has been switched to p.o. Protonix, she is to continue Protonix 40 mg p.o. twice daily.   Hyperlipidemia Resume home Lipitor and fenofibrate   CKD 3B Appears to be at her baseline creatinine  Hypomagnesemia Hypokalemia Replaced, monitor closely, recheck in a.m.   Hypoalbuminemia in the setting of alcohol abuse Isolated elevated AST, 2:1 ratio with ALT in the setting of alcohol abuse Albumin 3.1 Monitor for now.    Discharge Diagnoses:  Principal Problem:   Seizure (HCC) Active Problems:   HYPERTENSION, BENIGN ESSENTIAL   GERD (gastroesophageal reflux disease)   EtOH dependence (HCC)   GI bleed   Alcohol abuse   AVM (  arteriovenous malformation) of stomach, acquired with hemorrhage    Discharge Instructions  Discharge Instructions     Diet - low sodium heart healthy   Complete by: As directed    Discharge instructions   Complete by: As directed     Follow with Primary MD Grayce Sessions, NP in 7 days   Get CBC, CMP, checked  by Primary MD next visit.    Activity: As tolerated with Full fall precautions use walker/cane & assistance as needed   Disposition Home    Diet: Heart Healthy  On your next visit with your primary care physician please Get Medicines reviewed and adjusted.   Please request your Prim.MD to go over all Hospital Tests and Procedure/Radiological results at the follow up, please get all Hospital records sent to your Prim MD by signing hospital release before you go home.   If you experience worsening of your admission symptoms, develop shortness of breath, life threatening emergency, suicidal or homicidal thoughts you must seek medical attention immediately by calling 911 or calling your MD immediately  if symptoms less severe.  You Must read complete instructions/literature along with all the possible adverse reactions/side effects for all the Medicines you take and that have been prescribed to you. Take any new Medicines after you have completely understood and accpet all the possible adverse reactions/side effects.   Do not drive, operating heavy machinery, perform activities at heights, swimming or participation in water activities or provide baby sitting services if your were admitted for syncope or siezures until you have seen by Primary MD or a Neurologist and advised to do so again.  Do not drive when taking Pain medications.    Do not take more than prescribed Pain, Sleep and Anxiety Medications  Special Instructions: If you have smoked or chewed Tobacco  in the last 2 yrs please stop smoking, stop any regular Alcohol  and or any Recreational drug use.  Wear Seat belts while driving.   Please note  You were cared for by a hospitalist during your hospital stay. If you have any questions about your discharge medications or the care you received while you were in the hospital after you are  discharged, you can call the unit and asked to speak with the hospitalist on call if the hospitalist that took care of you is not available. Once you are discharged, your primary care physician will handle any further medical issues. Please note that NO REFILLS for any discharge medications will be authorized once you are discharged, as it is imperative that you return to your primary care physician (or establish a relationship with a primary care physician if you do not have one) for your aftercare needs so that they can reassess your need for medications and monitor your lab values.   Increase activity slowly   Complete by: As directed       Allergies as of 06/29/2022       Reactions   Losartan Potassium    Rash   Levaquin [levofloxacin In D5w] Rash   Ace Inhibitors Cough   REACTION: cough   Codeine Nausea Only   Cefepime Rash   09/04/12 pm Patient started to break out with small macules after IV Vanco infusion, then macules increased in size after starting cefepime infusion.   Vancomycin Rash   09/04/12 pm Patient started to break out with small macules after IV Vanco infusion, then macules increased in size after starting cefepime infusion.  Medication List     TAKE these medications    allopurinol 100 MG tablet Commonly known as: ZYLOPRIM Take 1 tablet (100 mg total) by mouth daily.   amLODipine 10 MG tablet Commonly known as: NORVASC TAKE 1 TABLET(10 MG) BY MOUTH DAILY   atorvastatin 20 MG tablet Commonly known as: LIPITOR TAKE 1 TABLET(20 MG) BY MOUTH DAILY   chlordiazePOXIDE 10 MG capsule Commonly known as: LIBRIUM Please take 10 mg oral 3 times daily for 3 days, then 10 mg oral 2 times daily for 3 days, then 10 mg oral daily for 3 days then stop.   clindamycin 1 % external solution Commonly known as: CLEOCIN T Apply topically 2 (two) times daily.   cyanocobalamin 1000 MCG tablet Commonly known as: VITAMIN B12 Take 1 tablet (1,000 mcg total) by mouth  daily.   fenofibrate 48 MG tablet Commonly known as: Tricor Take 1 tablet (48 mg total) by mouth daily.   ferrous sulfate 325 (65 FE) MG tablet Take 1 tablet (325 mg total) by mouth 2 (two) times daily with a meal.   fluticasone 50 MCG/ACT nasal spray Commonly known as: FLONASE USE 2 SPRAYS IN EACH NOSTRIL DAILY   folic acid 1 MG tablet Commonly known as: FOLVITE Take 1 tablet (1 mg total) by mouth daily.   ketoconazole 2 % cream Commonly known as: NIZORAL Apply 1 Application topically daily.   levETIRAcetam 500 MG tablet Commonly known as: KEPPRA Take 1 tablet (500 mg total) by mouth 2 (two) times daily.   magnesium oxide 400 (240 Mg) MG tablet Commonly known as: MAG-OX TAKE 1 TABLET BY MOUTH EVERY DAY   omega-3 acid ethyl esters 1 g capsule Commonly known as: LOVAZA Take 2 capsules (2 g total) by mouth 2 (two) times daily.   ondansetron 4 MG disintegrating tablet Commonly known as: ZOFRAN-ODT Take 1 tablet (4 mg total) by mouth every 8 (eight) hours as needed for nausea or vomiting.   pantoprazole 40 MG tablet Commonly known as: PROTONIX Take 1 tablet (40 mg total) by mouth 2 (two) times daily before a meal.   thiamine 100 MG tablet Commonly known as: Vitamin B-1 Take 1 tablet (100 mg total) by mouth daily.   tiZANidine 4 MG tablet Commonly known as: ZANAFLEX TAKE 1 TABLET BY MOUTH EVERY 8 HOURS AS NEEDED FOR MUSCLE SPASMS   True Metrix Blood Glucose Test test strip Generic drug: glucose blood Use as instructed   True Metrix Meter Devi 1 kit by Does not apply route 3 (three) times daily. CHECK BLOOD SUGAR UP TO 3 TIMES DAILY. E11.9   TRUEplus Lancets 28G Misc SMARTSIG:Topical 1 to 3 Times Daily   venlafaxine XR 37.5 MG 24 hr capsule Commonly known as: Effexor XR Take 1 capsule (37.5 mg total) by mouth daily with breakfast. For hot flashes        Allergies  Allergen Reactions   Losartan Potassium     Rash   Levaquin [Levofloxacin In D5w] Rash    Ace Inhibitors Cough    REACTION: cough   Codeine Nausea Only   Cefepime Rash    09/04/12 pm Patient started to break out with small macules after IV Vanco infusion, then macules increased in size after starting cefepime infusion.   Vancomycin Rash    09/04/12 pm Patient started to break out with small macules after IV Vanco infusion, then macules increased in size after starting cefepime infusion.    Consultations: None   Procedures/Studies: DG CHEST PORT 1  VIEW  Result Date: 06/25/2022 CLINICAL DATA:  Seizure, altered mental status EXAM: PORTABLE CHEST 1 VIEW COMPARISON:  06/02/2022 FINDINGS: The heart size and mediastinal contours are within normal limits. Both lungs are clear. The visualized skeletal structures are unremarkable. IMPRESSION: No active disease. Electronically Signed   By: Helyn Numbers M.D.   On: 06/25/2022 21:03   CT Head Wo Contrast  Result Date: 06/25/2022 CLINICAL DATA:  Seizures, altered level of consciousness EXAM: CT HEAD WITHOUT CONTRAST TECHNIQUE: Contiguous axial images were obtained from the base of the skull through the vertex without intravenous contrast. RADIATION DOSE REDUCTION: This exam was performed according to the departmental dose-optimization program which includes automated exposure control, adjustment of the mA and/or kV according to patient size and/or use of iterative reconstruction technique. COMPARISON:  09/06/2017 FINDINGS: Brain: Chronic areas of cortical encephalomalacia are seen within the left frontal and left occipital lobes. No evidence of acute infarct or hemorrhage. The lateral ventricles and midline structures are unremarkable. No acute extra-axial fluid collections. No mass effect. Vascular: No hyperdense vessel or unexpected calcification. Skull: Normal. Negative for fracture or focal lesion. Sinuses/Orbits: No acute finding. Other: None. IMPRESSION: 1. Stable head CT, no acute intracranial process. Electronically Signed   By: Sharlet Salina  M.D.   On: 06/25/2022 19:15   DG Chest 2 View  Result Date: 06/02/2022 CLINICAL DATA:  Shortness of breath EXAM: CHEST - 2 VIEW COMPARISON:  07/08/2016 FINDINGS: The heart size and mediastinal contours are within normal limits. Both lungs are clear. The visualized skeletal structures are unremarkable. IMPRESSION: No active cardiopulmonary disease. Electronically Signed   By: Alcide Clever M.D.   On: 06/02/2022 21:49      Subjective: No significant events overnight, she denies any complaints today.  Discharge Exam: Vitals:   06/29/22 0410 06/29/22 0827  BP: 114/89 129/82  Pulse: 96 (!) 105  Resp: 16 20  Temp: 98.2 F (36.8 C) 98.5 F (36.9 C)  SpO2: 100% 100%   Vitals:   06/28/22 2029 06/29/22 0010 06/29/22 0410 06/29/22 0827  BP: 120/85 122/82 114/89 129/82  Pulse: 94 93 96 (!) 105  Resp: 15 19 16 20   Temp: 98.1 F (36.7 C) 98 F (36.7 C) 98.2 F (36.8 C) 98.5 F (36.9 C)  TempSrc: Oral Oral Oral Oral  SpO2: 96% 100% 100% 100%  Weight:      Height:        General: Pt is alert, awake, not in acute distress Cardiovascular: RRR, S1/S2 +, no rubs, no gallops Respiratory: CTA bilaterally, no wheezing, no rhonchi Abdominal: Soft, NT, ND, bowel sounds + Extremities: no edema, no cyanosis    The results of significant diagnostics from this hospitalization (including imaging, microbiology, ancillary and laboratory) are listed below for reference.     Microbiology: No results found for this or any previous visit (from the past 240 hour(s)).   Labs: BNP (last 3 results) Recent Labs    06/05/22 0249  BNP 42.9   Basic Metabolic Panel: Recent Labs  Lab 06/25/22 1750 06/25/22 1800 06/26/22 0257 06/27/22 0315 06/28/22 0312 06/29/22 0421  NA 141  --  136 136 135 133*  K 3.5  --  3.6 3.3* 3.9 3.9  CL 108  --  108 107 106 106  CO2 <7*  --  20* 23 21* 21*  GLUCOSE 91  --  100* 97 91 93  BUN 5*  --  <5* <5* <5* 5*  CREATININE 1.29*  --  0.88 0.98 1.13* 0.99  CALCIUM 8.3*  --  7.6* 8.2* 8.0* 8.1*  MG  --  1.5* 1.9 1.4* 2.6* 1.9  PHOS  --   --  3.1 2.7 3.3 2.9   Liver Function Tests: Recent Labs  Lab 06/25/22 1750 06/26/22 0257  AST 59* 39  ALT 29 24  ALKPHOS 80 72  BILITOT 0.3 0.7  PROT 6.8 5.6*  ALBUMIN 3.1* 2.5*   No results for input(s): "LIPASE", "AMYLASE" in the last 168 hours. No results for input(s): "AMMONIA" in the last 168 hours. CBC: Recent Labs  Lab 06/25/22 1750 06/26/22 0413 06/27/22 0315 06/28/22 0312 06/29/22 0421  WBC 10.7* 7.7 8.1 9.5 11.8*  NEUTROABS 4.7  --   --  7.3 9.6*  HGB 8.5* 7.1* 10.8* 11.1* 11.7*  HCT 27.8* 21.6* 30.9* 34.1* 34.2*  MCV 101.5* 94.3 86.1 89.3 87.7  PLT 365 277 207 202 196   Cardiac Enzymes: Recent Labs  Lab 06/25/22 1800  CKTOTAL 84   BNP: Invalid input(s): "POCBNP" CBG: Recent Labs  Lab 06/25/22 1803  GLUCAP 71   D-Dimer No results for input(s): "DDIMER" in the last 72 hours. Hgb A1c No results for input(s): "HGBA1C" in the last 72 hours. Lipid Profile No results for input(s): "CHOL", "HDL", "LDLCALC", "TRIG", "CHOLHDL", "LDLDIRECT" in the last 72 hours. Thyroid function studies No results for input(s): "TSH", "T4TOTAL", "T3FREE", "THYROIDAB" in the last 72 hours.  Invalid input(s): "FREET3" Anemia work up No results for input(s): "VITAMINB12", "FOLATE", "FERRITIN", "TIBC", "IRON", "RETICCTPCT" in the last 72 hours. Urinalysis    Component Value Date/Time   COLORURINE COLORLESS (A) 06/26/2022 0119   APPEARANCEUR CLEAR 06/26/2022 0119   LABSPEC 1.006 06/26/2022 0119   PHURINE 6.0 06/26/2022 0119   GLUCOSEU NEGATIVE 06/26/2022 0119   HGBUR SMALL (A) 06/26/2022 0119   HGBUR trace-lysed 08/05/2006 1541   BILIRUBINUR NEGATIVE 06/26/2022 0119   BILIRUBINUR negative 06/20/2013 1652   KETONESUR NEGATIVE 06/26/2022 0119   PROTEINUR NEGATIVE 06/26/2022 0119   UROBILINOGEN 1.0 06/20/2013 1652   UROBILINOGEN 1.0 08/05/2006 1541   NITRITE NEGATIVE 06/26/2022 0119    LEUKOCYTESUR NEGATIVE 06/26/2022 0119   Sepsis Labs Recent Labs  Lab 06/26/22 0413 06/27/22 0315 06/28/22 0312 06/29/22 0421  WBC 7.7 8.1 9.5 11.8*   Microbiology No results found for this or any previous visit (from the past 240 hour(s)).   Time coordinating discharge: Over 30 minutes  SIGNED:   Huey Bienenstock, MD  [facility] 06/29/2022, 10:28 AM Pager   If 7PM-7AM, please contact night-coverage [email or web address]

## 2022-06-29 NOTE — Plan of Care (Signed)

## 2022-06-30 ENCOUNTER — Telehealth (INDEPENDENT_AMBULATORY_CARE_PROVIDER_SITE_OTHER): Payer: Self-pay

## 2022-06-30 NOTE — Transitions of Care (Post Inpatient/ED Visit) (Signed)
   06/30/2022  Name: Christina Byrd MRN: [id number] DOB: 07/23/1965  Today's TOC FU Call Status: Today's TOC FU Call Status:: Unsuccessul Call (1st Attempt) Unsuccessful Call (1st Attempt) Date: 06/30/22  Attempted to reach the patient regarding the most recent Inpatient/ED visit.  Follow Up Plan: Additional outreach attempts will be made to reach the patient to complete the Transitions of Care (Post Inpatient/ED visit) call.   Signature   Nusaiba Guallpa, CMA  [facility] Team

## 2022-07-01 NOTE — Transitions of Care (Post Inpatient/ED Visit) (Signed)
   07/01/2022  Name: Christina Byrd MRN: [id number] DOB: 1965/03/14  Today's TOC FU Call Status: Today's TOC FU Call Status:: Unsuccessful Call (2nd Attempt) Unsuccessful Call (1st Attempt) Date: 06/30/22 Unsuccessful Call (2nd Attempt) Date: 07/01/22  Attempted to reach the patient regarding the most recent Inpatient/ED visit.  Follow Up Plan: Additional outreach attempts will be made to reach the patient to complete the Transitions of Care (Post Inpatient/ED visit) call.   Signature   Taiwan Talcott, CMA  [facility] Team

## 2022-07-02 ENCOUNTER — Ambulatory Visit: Payer: BLUE CROSS/BLUE SHIELD | Admitting: Physician Assistant

## 2022-07-07 ENCOUNTER — Inpatient Hospital Stay (HOSPITAL_COMMUNITY)
Admission: EM | Admit: 2022-07-07 | Discharge: 2022-07-13 | DRG: 897 | Disposition: A | Discharge status: Home / Self Care | Payer: BLUE CROSS/BLUE SHIELD | Attending: Internal Medicine | Admitting: Internal Medicine

## 2022-07-07 ENCOUNTER — Emergency Department (HOSPITAL_COMMUNITY): Payer: BLUE CROSS/BLUE SHIELD

## 2022-07-07 ENCOUNTER — Other Ambulatory Visit: Payer: Self-pay

## 2022-07-07 DIAGNOSIS — N179 Acute kidney failure, unspecified: Secondary | ICD-10-CM | POA: Diagnosis present

## 2022-07-07 DIAGNOSIS — I129 Hypertensive chronic kidney disease with stage 1 through stage 4 chronic kidney disease, or unspecified chronic kidney disease: Secondary | ICD-10-CM | POA: Diagnosis present

## 2022-07-07 DIAGNOSIS — K760 Fatty (change of) liver, not elsewhere classified: Secondary | ICD-10-CM | POA: Diagnosis present

## 2022-07-07 DIAGNOSIS — M1711 Unilateral primary osteoarthritis, right knee: Secondary | ICD-10-CM | POA: Diagnosis present

## 2022-07-07 DIAGNOSIS — N1832 Chronic kidney disease, stage 3b: Secondary | ICD-10-CM | POA: Diagnosis present

## 2022-07-07 DIAGNOSIS — G40909 Epilepsy, unspecified, not intractable, without status epilepticus: Secondary | ICD-10-CM

## 2022-07-07 DIAGNOSIS — D509 Iron deficiency anemia, unspecified: Secondary | ICD-10-CM | POA: Diagnosis present

## 2022-07-07 DIAGNOSIS — D539 Nutritional anemia, unspecified: Secondary | ICD-10-CM | POA: Diagnosis present

## 2022-07-07 DIAGNOSIS — Z888 Allergy status to other drugs, medicaments and biological substances status: Secondary | ICD-10-CM | POA: Diagnosis not present

## 2022-07-07 DIAGNOSIS — D631 Anemia in chronic kidney disease: Secondary | ICD-10-CM | POA: Diagnosis present

## 2022-07-07 DIAGNOSIS — Z87891 Personal history of nicotine dependence: Secondary | ICD-10-CM | POA: Diagnosis not present

## 2022-07-07 DIAGNOSIS — R569 Unspecified convulsions: Secondary | ICD-10-CM | POA: Diagnosis not present

## 2022-07-07 DIAGNOSIS — K219 Gastro-esophageal reflux disease without esophagitis: Secondary | ICD-10-CM | POA: Diagnosis present

## 2022-07-07 DIAGNOSIS — Z885 Allergy status to narcotic agent status: Secondary | ICD-10-CM

## 2022-07-07 DIAGNOSIS — E1165 Type 2 diabetes mellitus with hyperglycemia: Secondary | ICD-10-CM | POA: Diagnosis present

## 2022-07-07 DIAGNOSIS — R188 Other ascites: Secondary | ICD-10-CM | POA: Diagnosis present

## 2022-07-07 DIAGNOSIS — Z881 Allergy status to other antibiotic agents status: Secondary | ICD-10-CM

## 2022-07-07 DIAGNOSIS — F10139 Alcohol abuse with withdrawal, unspecified: Secondary | ICD-10-CM | POA: Diagnosis present

## 2022-07-07 DIAGNOSIS — S36118A Other injury of liver, initial encounter: Secondary | ICD-10-CM | POA: Diagnosis not present

## 2022-07-07 DIAGNOSIS — Z7984 Long term (current) use of oral hypoglycemic drugs: Secondary | ICD-10-CM

## 2022-07-07 DIAGNOSIS — E785 Hyperlipidemia, unspecified: Secondary | ICD-10-CM | POA: Diagnosis present

## 2022-07-07 DIAGNOSIS — D689 Coagulation defect, unspecified: Secondary | ICD-10-CM | POA: Diagnosis present

## 2022-07-07 DIAGNOSIS — Z8249 Family history of ischemic heart disease and other diseases of the circulatory system: Secondary | ICD-10-CM

## 2022-07-07 DIAGNOSIS — E872 Acidosis, unspecified: Secondary | ICD-10-CM | POA: Diagnosis present

## 2022-07-07 DIAGNOSIS — E1122 Type 2 diabetes mellitus with diabetic chronic kidney disease: Secondary | ICD-10-CM | POA: Diagnosis present

## 2022-07-07 DIAGNOSIS — R7401 Elevation of levels of liver transaminase levels: Secondary | ICD-10-CM | POA: Diagnosis present

## 2022-07-07 DIAGNOSIS — M25561 Pain in right knee: Secondary | ICD-10-CM | POA: Diagnosis not present

## 2022-07-07 DIAGNOSIS — E860 Dehydration: Secondary | ICD-10-CM | POA: Diagnosis present

## 2022-07-07 DIAGNOSIS — T391X5A Adverse effect of 4-Aminophenol derivatives, initial encounter: Secondary | ICD-10-CM | POA: Diagnosis present

## 2022-07-07 DIAGNOSIS — Z79899 Other long term (current) drug therapy: Secondary | ICD-10-CM | POA: Diagnosis not present

## 2022-07-07 DIAGNOSIS — F10930 Alcohol use, unspecified with withdrawal, uncomplicated: Secondary | ICD-10-CM | POA: Diagnosis not present

## 2022-07-07 DIAGNOSIS — E119 Type 2 diabetes mellitus without complications: Secondary | ICD-10-CM

## 2022-07-07 DIAGNOSIS — R9431 Abnormal electrocardiogram [ECG] [EKG]: Secondary | ICD-10-CM | POA: Diagnosis present

## 2022-07-07 DIAGNOSIS — F10939 Alcohol use, unspecified with withdrawal, unspecified: Secondary | ICD-10-CM | POA: Diagnosis present

## 2022-07-07 DIAGNOSIS — D61818 Other pancytopenia: Secondary | ICD-10-CM | POA: Diagnosis present

## 2022-07-07 DIAGNOSIS — R7989 Other specified abnormal findings of blood chemistry: Secondary | ICD-10-CM | POA: Diagnosis not present

## 2022-07-07 DIAGNOSIS — F1010 Alcohol abuse, uncomplicated: Secondary | ICD-10-CM | POA: Diagnosis present

## 2022-07-07 DIAGNOSIS — K719 Toxic liver disease, unspecified: Secondary | ICD-10-CM | POA: Diagnosis present

## 2022-07-07 DIAGNOSIS — M109 Gout, unspecified: Secondary | ICD-10-CM | POA: Diagnosis present

## 2022-07-07 DIAGNOSIS — I10 Essential (primary) hypertension: Secondary | ICD-10-CM | POA: Diagnosis not present

## 2022-07-07 DIAGNOSIS — D649 Anemia, unspecified: Secondary | ICD-10-CM | POA: Diagnosis present

## 2022-07-07 LAB — CBC WITH DIFFERENTIAL/PLATELET
Abs Immature Granulocytes: 0.01 10*3/uL (ref 0.00–0.07)
Basophils Absolute: 0 10*3/uL (ref 0.0–0.1)
Basophils Relative: 1 %
Eosinophils Absolute: 0 10*3/uL (ref 0.0–0.5)
Eosinophils Relative: 0 %
HCT: 35.5 % — ABNORMAL LOW (ref 36.0–46.0)
Hemoglobin: 11.1 g/dL — ABNORMAL LOW (ref 12.0–15.0)
Immature Granulocytes: 0 %
Lymphocytes Relative: 24 %
Lymphs Abs: 1.4 10*3/uL (ref 0.7–4.0)
MCH: 29.2 pg (ref 26.0–34.0)
MCHC: 31.3 g/dL (ref 30.0–36.0)
MCV: 93.4 fL (ref 80.0–100.0)
Monocytes Absolute: 0.6 10*3/uL (ref 0.1–1.0)
Monocytes Relative: 10 %
Neutro Abs: 3.8 10*3/uL (ref 1.7–7.7)
Neutrophils Relative %: 65 %
Platelets: 227 10*3/uL (ref 150–400)
RBC: 3.8 MIL/uL — ABNORMAL LOW (ref 3.87–5.11)
RDW: 16.9 % — ABNORMAL HIGH (ref 11.5–15.5)
WBC: 5.8 10*3/uL (ref 4.0–10.5)
nRBC: 0 % (ref 0.0–0.2)

## 2022-07-07 LAB — COMPREHENSIVE METABOLIC PANEL
ALT: 14 U/L (ref 0–44)
AST: 41 U/L (ref 15–41)
Albumin: 2.7 g/dL — ABNORMAL LOW (ref 3.5–5.0)
Alkaline Phosphatase: 89 U/L (ref 38–126)
Anion gap: 15 (ref 5–15)
BUN: 10 mg/dL (ref 6–20)
CO2: 18 mmol/L — ABNORMAL LOW (ref 22–32)
Calcium: 8 mg/dL — ABNORMAL LOW (ref 8.9–10.3)
Chloride: 103 mmol/L (ref 98–111)
Creatinine, Ser: 1.26 mg/dL — ABNORMAL HIGH (ref 0.44–1.00)
GFR, Estimated: 50 mL/min — ABNORMAL LOW (ref >60)
Glucose, Bld: 331 mg/dL — ABNORMAL HIGH (ref 70–99)
Potassium: 4.1 mmol/L (ref 3.5–5.1)
Sodium: 136 mmol/L (ref 135–145)
Total Bilirubin: 0.3 mg/dL (ref 0.3–1.2)
Total Protein: 6.3 g/dL — ABNORMAL LOW (ref 6.5–8.1)

## 2022-07-07 LAB — ETHANOL: Alcohol, Ethyl (B): <10 mg/dL (ref <10)

## 2022-07-07 LAB — MAGNESIUM: Magnesium: 1.3 mg/dL — ABNORMAL LOW (ref 1.7–2.4)

## 2022-07-07 LAB — TYPE AND SCREEN
ABO/RH(D): B NEG
Antibody Screen: NEGATIVE

## 2022-07-07 LAB — LIPASE, BLOOD: Lipase: 140 U/L — ABNORMAL HIGH (ref 11–51)

## 2022-07-07 MED ORDER — ADULT MULTIVITAMIN W/MINERALS CH
1.0000 | ORAL_TABLET | Freq: Every day | ORAL | Status: DC
  Administered 2022-07-08 – 2022-07-13 (×6): 1 via ORAL
  Filled 2022-07-07 (×6): qty 1

## 2022-07-07 MED ORDER — THIAMINE MONONITRATE 100 MG PO TABS
100.0000 mg | ORAL_TABLET | Freq: Every day | ORAL | Status: DC
  Administered 2022-07-08 – 2022-07-13 (×6): 100 mg via ORAL
  Filled 2022-07-07 (×6): qty 1

## 2022-07-07 MED ORDER — PANTOPRAZOLE 80MG IVPB - SIMPLE MED
80.0000 mg | Freq: Once | INTRAVENOUS | Status: AC
  Administered 2022-07-07: 80 mg via INTRAVENOUS
  Filled 2022-07-07: qty 100

## 2022-07-07 MED ORDER — LORAZEPAM 2 MG/ML IJ SOLN
1.0000 mg | INTRAMUSCULAR | Status: AC | PRN
  Administered 2022-07-09: 2 mg via INTRAVENOUS
  Administered 2022-07-09: 1 mg via INTRAVENOUS
  Filled 2022-07-07 (×2): qty 1

## 2022-07-07 MED ORDER — FENTANYL CITRATE PF 50 MCG/ML IJ SOSY
50.0000 ug | PREFILLED_SYRINGE | Freq: Once | INTRAMUSCULAR | Status: AC
  Administered 2022-07-07: 50 ug via INTRAVENOUS
  Filled 2022-07-07: qty 1

## 2022-07-07 MED ORDER — FOLIC ACID 1 MG PO TABS
1.0000 mg | ORAL_TABLET | Freq: Every day | ORAL | Status: DC
  Administered 2022-07-08 – 2022-07-13 (×6): 1 mg via ORAL
  Filled 2022-07-07 (×6): qty 1

## 2022-07-07 MED ORDER — PANTOPRAZOLE INFUSION (NEW) - SIMPLE MED
8.0000 mg/h | INTRAVENOUS | Status: DC
  Filled 2022-07-07: qty 100

## 2022-07-07 MED ORDER — SODIUM CHLORIDE 0.9 % IV BOLUS
1000.0000 mL | Freq: Once | INTRAVENOUS | Status: AC
  Administered 2022-07-07: 1000 mL via INTRAVENOUS

## 2022-07-07 MED ORDER — LORAZEPAM 2 MG/ML IJ SOLN
0.0000 mg | INTRAMUSCULAR | Status: AC
  Filled 2022-07-07: qty 1

## 2022-07-07 MED ORDER — LORAZEPAM 1 MG PO TABS: 1.0000 mg | ORAL_TABLET | ORAL | Status: AC | PRN

## 2022-07-07 MED ORDER — THIAMINE HCL 100 MG/ML IJ SOLN
100.0000 mg | Freq: Every day | INTRAMUSCULAR | Status: DC
  Filled 2022-07-07: qty 2

## 2022-07-07 MED ORDER — LORAZEPAM 2 MG/ML IJ SOLN
0.0000 mg | Freq: Three times a day (TID) | INTRAMUSCULAR | Status: AC
  Administered 2022-07-10: 1 mg via INTRAVENOUS
  Filled 2022-07-07: qty 1

## 2022-07-07 NOTE — ED Provider Notes (Signed)
[place] [facility]ERGENCY DEPARTMENT AT [facility] Provider Note   CSN: [id number] Arrival date & time: 07/07/22  1937     History  Chief Complaint  Patient presents with   Fatigue    Christina Byrd is a 57 y.o. female history of alcohol abuse, diabetes, angioectasia, here presenting with fatigue.  Patient states that she has been tired and continues to drink alcohol.  Patient states that she is anemic per the blood drawn in the hospital.  Patient did not have further blood draw at urgent care per nursing report.  Patient states that she continues to drink alcohol.  She states that she has some right knee pain and she attributes that to gout.  Patient states that she feels tremulous.  Patient was recently admitted for alcohol withdrawal seizures.  Patient also was anemic and required transfusion.    The history is provided by the patient.       Home Medications Prior to Admission medications   Medication Sig Start Date End Date Taking? Authorizing Provider  allopurinol (ZYLOPRIM) 100 MG tablet Take 1 tablet (100 mg total) by mouth daily. 01/13/22   Grayce Sessions, NP  amLODipine (NORVASC) 10 MG tablet TAKE 1 TABLET(10 MG) BY MOUTH DAILY 05/22/22   Grayce Sessions, NP  atorvastatin (LIPITOR) 20 MG tablet TAKE 1 TABLET(20 MG) BY MOUTH DAILY 03/12/22   Grayce Sessions, NP  Blood Glucose Monitoring Suppl (TRUE METRIX METER) DEVI 1 kit by Does not apply route 3 (three) times daily. CHECK BLOOD SUGAR UP TO 3 TIMES DAILY. E11.9 02/27/19   Hoy Register, MD  chlordiazePOXIDE (LIBRIUM) 10 MG capsule Please take 1 capsule (10 mg) by mouth 3 times daily for 3 days, then 1 capsule (10 mg) by mouth 2 times daily for 3 days, then 1 capsule (10 mg) daily for 3 days then stop. 06/29/22   Elgergawy, Leana Roe, MD  clindamycin (CLEOCIN T) 1 % external solution Apply topically 2 (two) times daily.    [provider]  fenofibrate (TRICOR) 48 MG tablet Take 1 tablet (48 mg total)  by mouth daily. 04/28/22   Grayce Sessions, NP  ferrous sulfate 325 (65 FE) MG tablet Take 1 tablet (325 mg total) by mouth 2 (two) times daily with a meal. 06/05/22   Leroy Sea, MD  fluticasone (FLONASE) 50 MCG/ACT nasal spray USE 2 SPRAYS IN EACH NOSTRIL DAILY 06/25/21   Hoy Register, MD  folic acid (FOLVITE) 1 MG tablet Take 1 tablet (1 mg total) by mouth daily. 06/05/22   Leroy Sea, MD  glucose blood (TRUE METRIX BLOOD GLUCOSE TEST) test strip Use as instructed 03/06/20   Hoy Register, MD  ketoconazole (NIZORAL) 2 % cream Apply 1 Application topically daily.    [provider]  levETIRAcetam (KEPPRA) 500 MG tablet Take 1 tablet (500 mg total) by mouth 2 (two) times daily. 06/29/22 07/29/22  Elgergawy, Leana Roe, MD  magnesium oxide (MAG-OX) 400 (240 Mg) MG tablet TAKE 1 TABLET BY MOUTH EVERY DAY 06/24/21   Hoy Register, MD  omega-3 acid ethyl esters (LOVAZA) 1 g capsule Take 2 capsules (2 g total) by mouth 2 (two) times daily. 10/06/21   Hoy Register, MD  ondansetron (ZOFRAN-ODT) 4 MG disintegrating tablet Take 1 tablet (4 mg total) by mouth every 8 (eight) hours as needed for nausea or vomiting. 05/29/22   Rancour, Jeannett Senior, MD  pantoprazole (PROTONIX) 40 MG tablet Take 1 tablet (40 mg total) by mouth 2 (  two) times daily before a meal. 06/05/22   Leroy Sea, MD  thiamine (VITAMIN B-1) 100 MG tablet Take 1 tablet (100 mg total) by mouth daily. 07/28/20   Van Clines, MD  tiZANidine (ZANAFLEX) 4 MG tablet TAKE 1 TABLET BY MOUTH EVERY 8 HOURS AS NEEDED FOR MUSCLE SPASMS 11/02/21   Hoy Register, MD  TRUEplus Lancets 28G MISC SMARTSIG:Topical 1 to 3 Times Daily 03/06/20   Hoy Register, MD  venlafaxine XR (EFFEXOR XR) 37.5 MG 24 hr capsule Take 1 capsule (37.5 mg total) by mouth daily with breakfast. For hot flashes 10/05/21   Hoy Register, MD  vitamin B-12 (CYANOCOBALAMIN) 1000 MCG tablet Take 1 tablet (1,000 mcg total) by mouth daily. 07/28/20   Van Clines, MD   metFORMIN (GLUCOPHAGE) 500 MG tablet Take 0.5 tablets (250 mg total) by mouth daily with breakfast. 03/05/20 04/02/20  Anders Simmonds, PA-C      Allergies    Losartan potassium, Levaquin [levofloxacin in d5w], Ace inhibitors, Codeine, Cefepime, and Vancomycin    Review of Systems   Review of Systems  Musculoskeletal:        Right knee pain  Neurological:  Positive for tremors and weakness.  All other systems reviewed and are negative.   Physical Exam Updated Vital Signs BP 135/81   Pulse (!) 120   Temp 99.7 F (37.6 C) (Oral)   Resp 18   Wt 56.7 kg   LMP 08/23/2011   SpO2 100%   BMI 22.86 kg/m  Physical Exam Vitals and nursing note reviewed.  Constitutional:      Appearance: Normal appearance.     Comments: Tremulous  HENT:     Head: Normocephalic.     Nose: Nose normal.     Mouth/Throat:     Mouth: Mucous membranes are dry.  Eyes:     Extraocular Movements: Extraocular movements intact.     Pupils: Pupils are equal, round, and reactive to light.  Cardiovascular:     Rate and Rhythm: Regular rhythm. Tachycardia present.     Pulses: Normal pulses.     Heart sounds: Normal heart sounds.  Pulmonary:     Effort: Pulmonary effort is normal.     Breath sounds: Normal breath sounds.  Abdominal:     General: Abdomen is flat.     Palpations: Abdomen is soft.  Musculoskeletal:     Cervical back: Normal range of motion and neck supple.     Comments: Right knee with an effusion.  This is consistent with gout.  I do not think she has septic joint.  Skin:    General: Skin is warm.  Neurological:     General: No focal deficit present.     Mental Status: She is alert and oriented to person, place, and time.  Psychiatric:        Mood and Affect: Mood normal.     ED Results / Procedures / Treatments   Labs (all labs ordered are listed, but only abnormal results are displayed) Labs Reviewed  CBC WITH DIFFERENTIAL/PLATELET - Abnormal; Notable for the following  components:      Result Value   RBC 3.80 (*)    Hemoglobin 11.1 (*)    HCT 35.5 (*)    RDW 16.9 (*)    All other components within normal limits  COMPREHENSIVE METABOLIC PANEL - Abnormal; Notable for the following components:   CO2 18 (*)    Glucose, Bld 331 (*)    Creatinine, Ser 1.26 (*)  Calcium 8.0 (*)    Total Protein 6.3 (*)    Albumin 2.7 (*)    GFR, Estimated 50 (*)    All other components within normal limits  MAGNESIUM - Abnormal; Notable for the following components:   Magnesium 1.3 (*)    All other components within normal limits  LIPASE, BLOOD - Abnormal; Notable for the following components:   Lipase 140 (*)    All other components within normal limits  ETHANOL  TYPE AND SCREEN    EKG EKG Interpretation  Date/Time:  Wednesday Jul 07 2022 19:50:19 EDT Ventricular Rate:  133 PR Interval:  156 QRS Duration: 62 QT Interval:  378 QTC Calculation: 562 R Axis:   38 Text Interpretation: Prolonged QTc Sinus tachycardia T wave abnormality, consider inferolateral ischemia Abnormal ECG When compared with ECG of 25-Jun-2022 17:55, Prolonged QT new since previous Confirmed by Richardean Canal [phone]) on 07/07/2022 9:04:05 PM  Radiology DG Knee Complete 4 Views Right  Result Date: 07/07/2022 CLINICAL DATA:  Right knee pain. EXAM: RIGHT KNEE - COMPLETE 4+ VIEW COMPARISON:  AP Lat 02/11/2022. FINDINGS: There is normal bone mineralization without evidence of fracture, dislocation or joint effusions. 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 There is moderate joint space loss and osteophytosis involving the medial femorotibial compartment and mild narrowing and spurring of the patellofemoral compartment. Spurring change again noted of the tibial spines. The lateral compartment space is maintained. Findings consistent with nonerosive degenerative arthrosis. There are mild enthesopathic changes of the anterior patella. Soft tissues are unremarkable. Comparison to the prior  study reveals no significant interval change. IMPRESSION: Nonerosive degenerative changes without evidence of fractures. Bipartite patellar configuration. No significant interval change. Electronically Signed   By: Almira Bar M.D.   On: 07/07/2022 22:28    Procedures Procedures    Medications Ordered in ED Medications  LORazepam (ATIVAN) tablet 1-4 mg (has no administration in time range)    Or  LORazepam (ATIVAN) injection 1-4 mg (has no administration in time range)  thiamine (VITAMIN B1) tablet 100 mg (has no administration in time range)    Or  thiamine (VITAMIN B1) injection 100 mg (has no administration in time range)  folic acid (FOLVITE) tablet 1 mg (has no administration in time range)  multivitamin with minerals tablet 1 tablet (has no administration in time range)  LORazepam (ATIVAN) injection 0-4 mg (has no administration in time range)    Followed by  LORazepam (ATIVAN) injection 0-4 mg (has no administration in time range)  pantoprazole (PROTONIX) 80 mg /NS 100 mL IVPB (0 mg Intravenous Stopped 07/07/22 2207)  sodium chloride 0.9 % bolus 1,000 mL (1,000 mLs Intravenous New Bag/Given 07/07/22 2146)  fentaNYL (SUBLIMAZE) injection 50 mcg (50 mcg Intravenous Given 07/07/22 2254)    ED Course/ Medical Decision Making/ A&P                             Medical Decision Making Christina Byrd is a 57 y.o. female here presenting with right knee pain and also palpitations and tremors and weakness.  Patient is a very difficult historian.  She told me that she was anemic but has no melena.  She continues to drink alcohol after discharge.  Plan to get CIWA and CBC and CMP and right knee x-ray.  10:56 PM Patient CIWA score is 9.  I started her on stepdown CIWA protocol.  Patient's magnesium level is low at 1.3.  Patient's  lipase is stable at 140 and unchanged from previous.  Patient is still tachycardic at 120.  At this point patient will be admitted for alcohol  withdrawal.   Problems Addressed: Alcohol withdrawal syndrome without complication [facility]): acute illness or injury  Amount and/or Complexity of Data Reviewed Labs: ordered. Decision-making details documented in ED Course. Radiology: ordered and independent interpretation performed. Decision-making details documented in ED Course. ECG/medicine tests: ordered and independent interpretation performed. Decision-making details documented in ED Course.  Risk OTC drugs. Prescription drug management. Decision regarding hospitalization.    Final Clinical Impression(s) / ED Diagnoses Final diagnoses:  Alcohol withdrawal syndrome without complication [facility])    Rx / DC Orders ED Discharge Orders     None         Charlynne Pander, MD 07/07/22 2257

## 2022-07-07 NOTE — ED Triage Notes (Signed)
Pt arrives with c/o fatigue that started in the last few days. Per pt, she was sent from [facility] for a blood transfusion. Pt has hx of ulcers and GI bleeding. Pts last alcoholic drink was yesterday night. Pt HR in triage is 130s and pt has tremors. Pt also endorses right knee pain.

## 2022-07-07 NOTE — Social Work (Signed)
Patient chart was reviewed. SUD resources were added. TOC will follow up with the patient once the patient is admitted on the floor for any further resources that may be needed.

## 2022-07-07 NOTE — ED Notes (Signed)
Dr. Margo Aye rounding at bedside

## 2022-07-08 ENCOUNTER — Inpatient Hospital Stay (HOSPITAL_COMMUNITY): Payer: BLUE CROSS/BLUE SHIELD

## 2022-07-08 DIAGNOSIS — F10939 Alcohol use, unspecified with withdrawal, unspecified: Secondary | ICD-10-CM | POA: Diagnosis not present

## 2022-07-08 DIAGNOSIS — R7401 Elevation of levels of liver transaminase levels: Secondary | ICD-10-CM | POA: Diagnosis present

## 2022-07-08 DIAGNOSIS — D509 Iron deficiency anemia, unspecified: Secondary | ICD-10-CM | POA: Diagnosis present

## 2022-07-08 DIAGNOSIS — D539 Nutritional anemia, unspecified: Secondary | ICD-10-CM | POA: Diagnosis present

## 2022-07-08 DIAGNOSIS — D649 Anemia, unspecified: Secondary | ICD-10-CM | POA: Diagnosis present

## 2022-07-08 DIAGNOSIS — M25561 Pain in right knee: Secondary | ICD-10-CM

## 2022-07-08 DIAGNOSIS — D61818 Other pancytopenia: Secondary | ICD-10-CM | POA: Diagnosis present

## 2022-07-08 DIAGNOSIS — R569 Unspecified convulsions: Secondary | ICD-10-CM

## 2022-07-08 DIAGNOSIS — F10930 Alcohol use, unspecified with withdrawal, uncomplicated: Secondary | ICD-10-CM

## 2022-07-08 LAB — COMPREHENSIVE METABOLIC PANEL
ALT: 242 U/L — ABNORMAL HIGH (ref 0–44)
AST: 2067 U/L — ABNORMAL HIGH (ref 15–41)
Albumin: 2.2 g/dL — ABNORMAL LOW (ref 3.5–5.0)
Alkaline Phosphatase: 155 U/L — ABNORMAL HIGH (ref 38–126)
Anion gap: 13 (ref 5–15)
BUN: 8 mg/dL (ref 6–20)
CO2: 17 mmol/L — ABNORMAL LOW (ref 22–32)
Calcium: 7.9 mg/dL — ABNORMAL LOW (ref 8.9–10.3)
Chloride: 105 mmol/L (ref 98–111)
Creatinine, Ser: 1.1 mg/dL — ABNORMAL HIGH (ref 0.44–1.00)
GFR, Estimated: 59 mL/min — ABNORMAL LOW (ref >60)
Glucose, Bld: 245 mg/dL — ABNORMAL HIGH (ref 70–99)
Potassium: 4.2 mmol/L (ref 3.5–5.1)
Sodium: 135 mmol/L (ref 135–145)
Total Bilirubin: 1.3 mg/dL — ABNORMAL HIGH (ref 0.3–1.2)
Total Protein: 5.4 g/dL — ABNORMAL LOW (ref 6.5–8.1)

## 2022-07-08 LAB — CBC
HCT: 30.9 % — ABNORMAL LOW (ref 36.0–46.0)
Hemoglobin: 10.1 g/dL — ABNORMAL LOW (ref 12.0–15.0)
MCH: 29.3 pg (ref 26.0–34.0)
MCHC: 32.7 g/dL (ref 30.0–36.0)
MCV: 89.6 fL (ref 80.0–100.0)
Platelets: 73 10*3/uL — ABNORMAL LOW (ref 150–400)
RBC: 3.45 MIL/uL — ABNORMAL LOW (ref 3.87–5.11)
RDW: 16.7 % — ABNORMAL HIGH (ref 11.5–15.5)
WBC: 8.5 10*3/uL (ref 4.0–10.5)
nRBC: 0 % (ref 0.0–0.2)

## 2022-07-08 LAB — HEPATIC FUNCTION PANEL
ALT: 239 U/L — ABNORMAL HIGH (ref 0–44)
AST: 2075 U/L — ABNORMAL HIGH (ref 15–41)
Albumin: 2.2 g/dL — ABNORMAL LOW (ref 3.5–5.0)
Alkaline Phosphatase: 154 U/L — ABNORMAL HIGH (ref 38–126)
Bilirubin, Direct: 0.9 mg/dL — ABNORMAL HIGH (ref 0.0–0.2)
Indirect Bilirubin: 0.4 mg/dL (ref 0.3–0.9)
Total Bilirubin: 1.3 mg/dL — ABNORMAL HIGH (ref 0.3–1.2)
Total Protein: 5.5 g/dL — ABNORMAL LOW (ref 6.5–8.1)

## 2022-07-08 LAB — URIC ACID: Uric Acid, Serum: 8.7 mg/dL — ABNORMAL HIGH (ref 2.5–7.1)

## 2022-07-08 LAB — HEPATITIS PANEL, ACUTE
HCV Ab: NONREACTIVE
Hep A IgM: NONREACTIVE
Hep B C IgM: NONREACTIVE
Hepatitis B Surface Ag: NONREACTIVE

## 2022-07-08 LAB — GLUCOSE, CAPILLARY
Glucose-Capillary: 136 mg/dL — ABNORMAL HIGH (ref 70–99)
Glucose-Capillary: 173 mg/dL — ABNORMAL HIGH (ref 70–99)

## 2022-07-08 LAB — SEDIMENTATION RATE: Sed Rate: 19 mm/hr (ref 0–22)

## 2022-07-08 LAB — LIPASE, BLOOD: Lipase: 125 U/L — ABNORMAL HIGH (ref 11–51)

## 2022-07-08 LAB — C-REACTIVE PROTEIN: CRP: 0.7 mg/dL (ref <1.0)

## 2022-07-08 LAB — CK: Total CK: 55 U/L (ref 38–234)

## 2022-07-08 MED ORDER — INSULIN ASPART 100 UNIT/ML IJ SOLN: 0.0000 [IU] | Freq: Three times a day (TID) | INTRAMUSCULAR | Status: DC

## 2022-07-08 MED ORDER — PANTOPRAZOLE SODIUM 40 MG PO TBEC
40.0000 mg | DELAYED_RELEASE_TABLET | Freq: Two times a day (BID) | ORAL | Status: DC
  Administered 2022-07-08 – 2022-07-13 (×10): 40 mg via ORAL
  Filled 2022-07-08 (×11): qty 1

## 2022-07-08 MED ORDER — BUPIVACAINE HCL (PF) 0.5 % IJ SOLN
10.0000 mL | Freq: Once | INTRAMUSCULAR | Status: AC
  Administered 2022-07-08: 10 mL
  Filled 2022-07-08: qty 10

## 2022-07-08 MED ORDER — SODIUM CHLORIDE 0.9 % IV SOLN: INTRAVENOUS | Status: DC

## 2022-07-08 MED ORDER — LIDOCAINE 5 % EX PTCH
1.0000 | MEDICATED_PATCH | CUTANEOUS | Status: DC
  Administered 2022-07-08 – 2022-07-13 (×6): 1 via TRANSDERMAL
  Filled 2022-07-08 (×6): qty 1

## 2022-07-08 MED ORDER — ALLOPURINOL 100 MG PO TABS
100.0000 mg | ORAL_TABLET | Freq: Every day | ORAL | Status: DC
  Administered 2022-07-08 – 2022-07-13 (×6): 100 mg via ORAL
  Filled 2022-07-08 (×6): qty 1

## 2022-07-08 MED ORDER — MAGNESIUM SULFATE 4 GM/100ML IV SOLN
4.0000 g | Freq: Once | INTRAVENOUS | Status: AC
  Administered 2022-07-08: 4 g via INTRAVENOUS
  Filled 2022-07-08: qty 100

## 2022-07-08 MED ORDER — METHYLPREDNISOLONE ACETATE 40 MG/ML IJ SUSP
40.0000 mg | Freq: Once | INTRAMUSCULAR | Status: AC
  Administered 2022-07-08: 40 mg via INTRA_ARTICULAR
  Filled 2022-07-08: qty 1

## 2022-07-08 MED ORDER — VENLAFAXINE HCL ER 37.5 MG PO CP24
37.5000 mg | ORAL_CAPSULE | Freq: Every day | ORAL | Status: DC
  Administered 2022-07-08 – 2022-07-09 (×2): 37.5 mg via ORAL
  Filled 2022-07-08 (×2): qty 1

## 2022-07-08 MED ORDER — LEVETIRACETAM 500 MG PO TABS
500.0000 mg | ORAL_TABLET | Freq: Two times a day (BID) | ORAL | Status: DC
  Administered 2022-07-08 – 2022-07-13 (×10): 500 mg via ORAL
  Filled 2022-07-08 (×11): qty 1

## 2022-07-08 MED ORDER — ENOXAPARIN SODIUM 40 MG/0.4ML IJ SOSY
40.0000 mg | PREFILLED_SYRINGE | INTRAMUSCULAR | Status: DC
  Administered 2022-07-08 – 2022-07-13 (×6): 40 mg via SUBCUTANEOUS
  Filled 2022-07-08 (×6): qty 0.4

## 2022-07-08 MED ORDER — STERILE WATER FOR INJECTION IV SOLN
INTRAVENOUS | Status: DC
  Filled 2022-07-08: qty 1000
  Filled 2022-07-08: qty 150
  Filled 2022-07-08: qty 1000

## 2022-07-08 MED ORDER — INSULIN ASPART 100 UNIT/ML IJ SOLN: 0.0000 [IU] | Freq: Every day | INTRAMUSCULAR | Status: DC

## 2022-07-08 NOTE — ED Notes (Signed)
ED TO INPATIENT HANDOFF REPORT  ED Nurse Name and Phone #: Trish Mage [phone]  S Name/Age/Gender Christina Byrd 57 y.o. female Room/Bed: RESUSC/RESUSC  Code Status   Code Status: Prior  Home/SNF/Other Home Patient oriented to: self, place, time, and situation Is this baseline? Yes   Triage Complete: Triage complete  Chief Complaint Alcohol withdrawal (HCC) [F10.939]  Triage Note Pt arrives with c/o fatigue that started in the last few days. Per pt, she was sent from [facility] for a blood transfusion. Pt has hx of ulcers and GI bleeding. Pts last alcoholic drink was yesterday night. Pt HR in triage is 130s and pt has tremors. Pt also endorses right knee pain.    Allergies Allergies  Allergen Reactions   Losartan Potassium     Rash   Levaquin [Levofloxacin In D5w] Rash   Ace Inhibitors Cough    REACTION: cough   Codeine Nausea Only   Cefepime Rash    09/04/12 pm Patient started to break out with small macules after IV Vanco infusion, then macules increased in size after starting cefepime infusion.   Vancomycin Rash    09/04/12 pm Patient started to break out with small macules after IV Vanco infusion, then macules increased in size after starting cefepime infusion.    Level of Care/Admitting Diagnosis ED Disposition     ED Disposition  Admit   Condition  --   Comment  Hospital Area: [facility] [[id number]]  Level of Care: Progressive [102]  Admit to Progressive based on following criteria: MULTISYSTEM THREATS such as stable sepsis, metabolic/electrolyte imbalance with or without encephalopathy that is responding to early treatment.  May admit patient to [facility] or [facility] if equivalent level of care is available:: No  Covid Evaluation: Asymptomatic - no recent exposure (last 10 days) testing not required  Diagnosis: Alcohol withdrawal (HCC) [291.81.ICD-9-CM]  Admitting Physician: Darlin Drop [[id number]]  Attending Physician: Darlin Drop  [[id number]]  Certification:: I certify this patient will need inpatient services for at least 2 midnights  Estimated Length of Stay: 2          B Medical/Surgery History Past Medical History:  Diagnosis Date   GASTROESOPHAGEAL REFLUX, NO ESOPHAGITIS 04/21/2006   Qualifier: Diagnosis of  By: Levada Schilling     GERD (gastroesophageal reflux disease) Dx 1995   HTN (hypertension)    Seizures (HCC)    Past Surgical History:  Procedure Laterality Date   BIOPSY  06/03/2022   Procedure: BIOPSY;  Surgeon: Jenel Lucks, MD;  Location: [facility] ENDOSCOPY;  Service: Gastroenterology;;   ESOPHAGOGASTRODUODENOSCOPY (EGD) WITH PROPOFOL N/A 06/03/2022   Procedure: ESOPHAGOGASTRODUODENOSCOPY (EGD) WITH PROPOFOL;  Surgeon: Jenel Lucks, MD;  Location: [facility] ENDOSCOPY;  Service: Gastroenterology;  Laterality: N/A;   HOT HEMOSTASIS N/A 06/03/2022   Procedure: HOT HEMOSTASIS (ARGON PLASMA COAGULATION/BICAP);  Surgeon: Jenel Lucks, MD;  Location: [facility] ENDOSCOPY;  Service: Gastroenterology;  Laterality: N/A;     A IV Location/Drains/Wounds Patient Lines/Drains/Airways Status     Active Line/Drains/Airways     Name Placement date Placement time Site Days   Peripheral IV 07/07/22 20 G Anterior;Proximal;Right Forearm 07/07/22  2123  Forearm  1            Intake/Output Last 24 hours  Intake/Output Summary (Last 24 hours) at 07/08/2022 0025 Last data filed at 07/07/2022 2207 Gross per 24 hour  Intake 100 ml  Output --  Net 100 ml    Labs/Imaging Results for orders placed or  performed during the hospital encounter of 07/07/22 (from the past 48 hour(s))  CBC with Differential     Status: Abnormal   Collection Time: 07/07/22  9:23 PM  Result Value Ref Range   WBC 5.8 4.0 - 10.5 K/uL   RBC 3.80 (L) 3.87 - 5.11 MIL/uL   Hemoglobin 11.1 (L) 12.0 - 15.0 g/dL   HCT 45.4 (L) 09.8 - 11.9 %   MCV 93.4 80.0 - 100.0 fL   MCH 29.2 26.0 - 34.0 pg   MCHC 31.3 30.0 - 36.0 g/dL   RDW 14.7 (H)  82.9 - 15.5 %   Platelets 227 150 - 400 K/uL   nRBC 0.0 0.0 - 0.2 %   Neutrophils Relative % 65 %   Neutro Abs 3.8 1.7 - 7.7 K/uL   Lymphocytes Relative 24 %   Lymphs Abs 1.4 0.7 - 4.0 K/uL   Monocytes Relative 10 %   Monocytes Absolute 0.6 0.1 - 1.0 K/uL   Eosinophils Relative 0 %   Eosinophils Absolute 0.0 0.0 - 0.5 K/uL   Basophils Relative 1 %   Basophils Absolute 0.0 0.0 - 0.1 K/uL   Immature Granulocytes 0 %   Abs Immature Granulocytes 0.01 0.00 - 0.07 K/uL    Comment: Performed at [facility], [address] [place], [place] [address]  Type and screen [facility]     Status: None   Collection Time: 07/07/22  9:23 PM  Result Value Ref Range   ABO/RH(D) B NEG    Antibody Screen NEG    Sample Expiration      07/10/2022,2359 Performed at [facility], [address], [place], [place] [address]   Ethanol     Status: None   Collection Time: 07/07/22  9:23 PM  Result Value Ref Range   Alcohol, Ethyl (B) <10 <10 mg/dL    Comment: (NOTE) Lowest detectable limit for serum alcohol is 10 mg/dL.  For medical purposes only. Performed at [facility], [address], [place], [place] [address]   Comprehensive metabolic panel     Status: Abnormal   Collection Time: 07/07/22  9:23 PM  Result Value Ref Range   Sodium 136 135 - 145 mmol/L   Potassium 4.1 3.5 - 5.1 mmol/L   Chloride 103 98 - 111 mmol/L   CO2 18 (L) 22 - 32 mmol/L   Glucose, Bld 331 (H) 70 - 99 mg/dL    Comment: Glucose reference range applies only to samples taken after fasting for at least 8 hours.   BUN 10 6 - 20 mg/dL   Creatinine, Ser 2.95 (H) 0.44 - 1.00 mg/dL   Calcium 8.0 (L) 8.9 - 10.3 mg/dL   Total Protein 6.3 (L) 6.5 - 8.1 g/dL   Albumin 2.7 (L) 3.5 - 5.0 g/dL   AST 41 15 - 41 U/L   ALT 14 0 - 44 U/L   Alkaline Phosphatase 89 38 - 126 U/L   Total Bilirubin 0.3 0.3 - 1.2 mg/dL   GFR, Estimated 50 (L) >60 mL/min    Comment: (NOTE) Calculated using the CKD-EPI  Creatinine Equation (2021)    Anion gap 15 5 - 15    Comment: Performed at [facility], [address], [place], [place] [address]  Magnesium     Status: Abnormal   Collection Time: 07/07/22  9:23 PM  Result Value Ref Range   Magnesium 1.3 (L) 1.7 - 2.4 mg/dL    Comment:  Performed at [facility], [address], [place], [place] [address]  Lipase, blood     Status: Abnormal   Collection Time: 07/07/22  9:23 PM  Result Value Ref Range   Lipase 140 (H) 11 - 51 U/L    Comment: Performed at [facility], [address] [place], [place] [address]   DG Knee Complete 4 Views Right  Result Date: 07/07/2022 CLINICAL DATA:  Right knee pain. EXAM: RIGHT KNEE - COMPLETE 4+ VIEW COMPARISON:  AP Lat 02/11/2022. FINDINGS: There is normal bone mineralization without evidence of fracture, dislocation or joint effusions. 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 There is moderate joint space loss and osteophytosis involving the medial femorotibial compartment and mild narrowing and spurring of the patellofemoral compartment. Spurring change again noted of the tibial spines. The lateral compartment space is maintained. Findings consistent with nonerosive degenerative arthrosis. There are mild enthesopathic changes of the anterior patella. Soft tissues are unremarkable. Comparison to the prior study reveals no significant interval change. IMPRESSION: Nonerosive degenerative changes without evidence of fractures. Bipartite patellar configuration. No significant interval change. Electronically Signed   By: Almira Bar M.D.   On: 07/07/2022 22:28    Pending Labs Unresulted Labs (From admission, onward)    None       Vitals/Pain Today's Vitals   07/07/22 2135 07/07/22 2145 07/07/22 2219 07/07/22 2245  BP:  135/81 135/81 139/86  Pulse: (!) 123 (!) 120 (!) 120 (!) 114  Resp:  18  15  Temp:      TempSrc:      SpO2: 100% 100%  100%  Weight:      PainSc:        Isolation  Precautions No active isolations  Medications Medications  LORazepam (ATIVAN) tablet 1-4 mg (has no administration in time range)    Or  LORazepam (ATIVAN) injection 1-4 mg (has no administration in time range)  thiamine (VITAMIN B1) tablet 100 mg (has no administration in time range)    Or  thiamine (VITAMIN B1) injection 100 mg (has no administration in time range)  folic acid (FOLVITE) tablet 1 mg (has no administration in time range)  multivitamin with minerals tablet 1 tablet (has no administration in time range)  LORazepam (ATIVAN) injection 0-4 mg (has no administration in time range)    Followed by  LORazepam (ATIVAN) injection 0-4 mg (has no administration in time range)  pantoprazole (PROTONIX) 80 mg /NS 100 mL IVPB (0 mg Intravenous Stopped 07/07/22 2207)  sodium chloride 0.9 % bolus 1,000 mL (1,000 mLs Intravenous New Bag/Given 07/07/22 2146)  fentaNYL (SUBLIMAZE) injection 50 mcg (50 mcg Intravenous Given 07/07/22 2254)    Mobility walks with person assist     Focused Assessments Cardiac Assessment Handoff:    Lab Results  Component Value Date   CKTOTAL 84 06/25/2022   CKMBINDEX <1.0 07/23/2016   TROPONINI <0.03 07/09/2016   No results found for: "DDIMER" Does the Patient currently have chest pain? No    R Recommendations: See Admitting Provider Note  Report given to:   Additional Notes: none

## 2022-07-08 NOTE — Plan of Care (Signed)

## 2022-07-08 NOTE — Inpatient Diabetes Management (Signed)
Inpatient Diabetes Program Recommendations  AACE/ADA: New Consensus Statement on Inpatient Glycemic Control (2015)  Target Ranges:  Prepandial:   less than 140 mg/dL      Peak postprandial:   less than 180 mg/dL (1-2 hours)      Critically ill patients:  140 - 180 mg/dL   Lab Results  Component Value Date   GLUCAP 71 06/25/2022   HGBA1C 4.6 (L) 04/27/2022    Review of Glycemic Control  Latest Reference Range & Units 07/07/22 21:23 07/08/22 08:43  Glucose 70 - 99 mg/dL 161 (H) 096 (H)   Diabetes history: No DM  Current orders for Inpatient glycemic control:  None A1c 4.6% on 3/5  Glucose 331/245  Inpatient Diabetes Program Recommendations:    -  Start Novolog 0-9 units tid + hs  Thanks,  Christena Deem RN, MSN, BC-ADM Inpatient Diabetes Coordinator Team Pager [phone] (8a-5p)

## 2022-07-08 NOTE — Consult Note (Signed)
Reason for Consult:Right knee pain Referring Physician: Ripudeep RAi Time called: 1157 Time at bedside: 1506   Christina Byrd is an 57 y.o. female.  HPI: Adelay was admitted yesterday with right knee pain. She says it's been hurting severely for about 2-3 weeks and she's been unable to bear weight. However, over the last 24-48h it has gotten remarkably better. She has attacks like this on and off where her knee swells and hurts and then gets better. She went to UC once years ago and had it drained and was possibly given a dx of gout but also thinks this is gout because it runs in her family and this looks similar.  Past Medical History:  Diagnosis Date   GASTROESOPHAGEAL REFLUX, NO ESOPHAGITIS 04/21/2006   Qualifier: Diagnosis of  By: Levada Schilling     GERD (gastroesophageal reflux disease) Dx 1995   HTN (hypertension)    Seizures (HCC)     Past Surgical History:  Procedure Laterality Date   BIOPSY  06/03/2022   Procedure: BIOPSY;  Surgeon: Jenel Lucks, MD;  Location: [facility] ENDOSCOPY;  Service: Gastroenterology;;   ESOPHAGOGASTRODUODENOSCOPY (EGD) WITH PROPOFOL N/A 06/03/2022   Procedure: ESOPHAGOGASTRODUODENOSCOPY (EGD) WITH PROPOFOL;  Surgeon: Jenel Lucks, MD;  Location: [facility] ENDOSCOPY;  Service: Gastroenterology;  Laterality: N/A;   HOT HEMOSTASIS N/A 06/03/2022   Procedure: HOT HEMOSTASIS (ARGON PLASMA COAGULATION/BICAP);  Surgeon: Jenel Lucks, MD;  Location: [facility] ENDOSCOPY;  Service: Gastroenterology;  Laterality: N/A;    Family History  Problem Relation Age of Onset   CAD Mother     Social History:  reports that she quit smoking about 10 years ago. Her smoking use included cigarettes. She has never used smokeless tobacco. She reports that she does not currently use alcohol after a past usage of about 49.0 standard drinks of alcohol per week. She reports that she does not use drugs.  Allergies:  Allergies  Allergen Reactions   Losartan Potassium      Rash   Levaquin [Levofloxacin In D5w] Rash   Ace Inhibitors Cough    REACTION: cough   Codeine Nausea Only   Cefepime Rash    09/04/12 pm Patient started to break out with small macules after IV Vanco infusion, then macules increased in size after starting cefepime infusion.   Vancomycin Rash    09/04/12 pm Patient started to break out with small macules after IV Vanco infusion, then macules increased in size after starting cefepime infusion.    Medications: I have reviewed the patient's current medications.  Results for orders placed or performed during the hospital encounter of 07/07/22 (from the past 48 hour(s))  CBC with Differential     Status: Abnormal   Collection Time: 07/07/22  9:23 PM  Result Value Ref Range   WBC 5.8 4.0 - 10.5 K/uL   RBC 3.80 (L) 3.87 - 5.11 MIL/uL   Hemoglobin 11.1 (L) 12.0 - 15.0 g/dL   HCT 16.1 (L) 09.6 - 04.5 %   MCV 93.4 80.0 - 100.0 fL   MCH 29.2 26.0 - 34.0 pg   MCHC 31.3 30.0 - 36.0 g/dL   RDW 40.9 (H) 81.1 - 91.4 %   Platelets 227 150 - 400 K/uL   nRBC 0.0 0.0 - 0.2 %   Neutrophils Relative % 65 %   Neutro Abs 3.8 1.7 - 7.7 K/uL   Lymphocytes Relative 24 %   Lymphs Abs 1.4 0.7 - 4.0 K/uL   Monocytes Relative 10 %   Monocytes Absolute  0.6 0.1 - 1.0 K/uL   Eosinophils Relative 0 %   Eosinophils Absolute 0.0 0.0 - 0.5 K/uL   Basophils Relative 1 %   Basophils Absolute 0.0 0.0 - 0.1 K/uL   Immature Granulocytes 0 %   Abs Immature Granulocytes 0.01 0.00 - 0.07 K/uL    Comment: Performed at [facility], [address] [place], [place] [address]  Type and screen [facility]     Status: None   Collection Time: 07/07/22  9:23 PM  Result Value Ref Range   ABO/RH(D) B NEG    Antibody Screen NEG    Sample Expiration      07/10/2022,2359 Performed at [facility], [address] [place], [place] [address]   Ethanol     Status: None   Collection Time: 07/07/22  9:23 PM  Result Value Ref Range   Alcohol, Ethyl  (B) <10 <10 mg/dL    Comment: (NOTE) Lowest detectable limit for serum alcohol is 10 mg/dL.  For medical purposes only. Performed at [facility], [address] [place], [place] [address]   Comprehensive metabolic panel     Status: Abnormal   Collection Time: 07/07/22  9:23 PM  Result Value Ref Range   Sodium 136 135 - 145 mmol/L   Potassium 4.1 3.5 - 5.1 mmol/L   Chloride 103 98 - 111 mmol/L   CO2 18 (L) 22 - 32 mmol/L   Glucose, Bld 331 (H) 70 - 99 mg/dL    Comment: Glucose reference range applies only to samples taken after fasting for at least 8 hours.   BUN 10 6 - 20 mg/dL   Creatinine, Ser 9.14 (H) 0.44 - 1.00 mg/dL   Calcium 8.0 (L) 8.9 - 10.3 mg/dL   Total Protein 6.3 (L) 6.5 - 8.1 g/dL   Albumin 2.7 (L) 3.5 - 5.0 g/dL   AST 41 15 - 41 U/L   ALT 14 0 - 44 U/L   Alkaline Phosphatase 89 38 - 126 U/L   Total Bilirubin 0.3 0.3 - 1.2 mg/dL   GFR, Estimated 50 (L) >60 mL/min    Comment: (NOTE) Calculated using the CKD-EPI Creatinine Equation (2021)    Anion gap 15 5 - 15    Comment: Performed at [facility], [address] [place], [place] [address]  Magnesium     Status: Abnormal   Collection Time: 07/07/22  9:23 PM  Result Value Ref Range   Magnesium 1.3 (L) 1.7 - 2.4 mg/dL    Comment: Performed at [facility], [address], [place], [place] [address]  Lipase, blood     Status: Abnormal   Collection Time: 07/07/22  9:23 PM  Result Value Ref Range   Lipase 140 (H) 11 - 51 U/L    Comment: Performed at [facility], [address], [place], [place] [address]  CBC     Status: Abnormal   Collection Time: 07/08/22  8:43 AM  Result Value Ref Range   WBC 8.5 4.0 - 10.5 K/uL   RBC 3.45 (L) 3.87 - 5.11 MIL/uL   Hemoglobin 10.1 (L) 12.0 - 15.0 g/dL   HCT 46.9 (L) 62.9 - 52.8 %   MCV 89.6 80.0 - 100.0 fL   MCH 29.3 26.0 - 34.0 pg   MCHC 32.7 30.0 - 36.0 g/dL   RDW 41.3 (H) 24.4 - 01.0 %   Platelets 73 (L) 150 - 400 K/uL  Comment: Immature  Platelet Fraction may be clinically indicated, consider ordering this additional test [id number] REPEATED TO VERIFY PLATELET COUNT CONFIRMED BY SMEAR    nRBC 0.0 0.0 - 0.2 %    Comment: Performed at [facility], [address], [place], [place] [address]  Comprehensive metabolic panel     Status: Abnormal   Collection Time: 07/08/22  8:43 AM  Result Value Ref Range   Sodium 135 135 - 145 mmol/L   Potassium 4.2 3.5 - 5.1 mmol/L   Chloride 105 98 - 111 mmol/L   CO2 17 (L) 22 - 32 mmol/L   Glucose, Bld 245 (H) 70 - 99 mg/dL    Comment: Glucose reference range applies only to samples taken after fasting for at least 8 hours.   BUN 8 6 - 20 mg/dL   Creatinine, Ser 1.19 (H) 0.44 - 1.00 mg/dL   Calcium 7.9 (L) 8.9 - 10.3 mg/dL   Total Protein 5.4 (L) 6.5 - 8.1 g/dL   Albumin 2.2 (L) 3.5 - 5.0 g/dL   AST 1,478 (H) 15 - 41 U/L   ALT 242 (H) 0 - 44 U/L   Alkaline Phosphatase 155 (H) 38 - 126 U/L   Total Bilirubin 1.3 (H) 0.3 - 1.2 mg/dL   GFR, Estimated 59 (L) >60 mL/min    Comment: (NOTE) Calculated using the CKD-EPI Creatinine Equation (2021)    Anion gap 13 5 - 15    Comment: Performed at [facility], [address] [place], [place] [address]  Lipase, blood     Status: Abnormal   Collection Time: 07/08/22  8:43 AM  Result Value Ref Range   Lipase 125 (H) 11 - 51 U/L    Comment: Performed at [facility], [address] [place], [place] [address]  CK     Status: None   Collection Time: 07/08/22  8:43 AM  Result Value Ref Range   Total CK 55 38 - 234 U/L    Comment: Performed at [facility], [address] [place], [place] [address]  Sedimentation rate     Status: None   Collection Time: 07/08/22  8:43 AM  Result Value Ref Range   Sed Rate 19 0 - 22 mm/hr    Comment: Performed at [facility], [address], [place], [place] [address]  C-reactive protein     Status: None   Collection Time: 07/08/22  8:43 AM  Result Value Ref Range   CRP 0.7  <1.0 mg/dL    Comment: Performed at [facility], [address], [place], [place] [address]  Uric acid     Status: Abnormal   Collection Time: 07/08/22  8:43 AM  Result Value Ref Range   Uric Acid, Serum 8.7 (H) 2.5 - 7.1 mg/dL    Comment: HEMOLYSIS AT THIS LEVEL MAY AFFECT RESULT Performed at [facility], [address], [place], [place] [address]   Hepatic function panel     Status: Abnormal   Collection Time: 07/08/22  8:43 AM  Result Value Ref Range   Total Protein 5.5 (L) 6.5 - 8.1 g/dL   Albumin 2.2 (L) 3.5 - 5.0 g/dL   AST 0,272 (H) 15 - 41 U/L   ALT 239 (H) 0 - 44 U/L   Alkaline Phosphatase 154 (H) 38 - 126 U/L   Total Bilirubin 1.3 (H) 0.3 - 1.2 mg/dL   Bilirubin, Direct 0.9 (H) 0.0 - 0.2  mg/dL   Indirect Bilirubin 0.4 0.3 - 0.9 mg/dL    Comment: Performed at [facility], [address] [place], [place] [address]    DG Knee Complete 4 Views Right  Result Date: 07/07/2022 CLINICAL DATA:  Right knee pain. EXAM: RIGHT KNEE - COMPLETE 4+ VIEW COMPARISON:  AP Lat 02/11/2022. FINDINGS: There is normal bone mineralization without evidence of fracture, dislocation or joint effusions. 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 There is moderate joint space loss and osteophytosis involving the medial femorotibial compartment and mild narrowing and spurring of the patellofemoral compartment. Spurring change again noted of the tibial spines. The lateral compartment space is maintained. Findings consistent with nonerosive degenerative arthrosis. There are mild enthesopathic changes of the anterior patella. Soft tissues are unremarkable. Comparison to the prior study reveals no significant interval change. IMPRESSION: Nonerosive degenerative changes without evidence of fractures. Bipartite patellar configuration. No significant interval change. Electronically Signed   By: Almira Bar M.D.   On: 07/07/2022 22:28    Review of Systems  Constitutional:  Negative  for chills, diaphoresis and fever.  HENT:  Negative for ear discharge, ear pain, hearing loss and tinnitus.   Eyes:  Negative for photophobia and pain.  Respiratory:  Negative for cough and shortness of breath.   Cardiovascular:  Negative for chest pain.  Gastrointestinal:  Negative for abdominal pain, nausea and vomiting.  Genitourinary:  Negative for dysuria, flank pain, frequency and urgency.  Musculoskeletal:  Positive for arthralgias (Right knee). Negative for back pain, myalgias and neck pain.  Neurological:  Negative for dizziness and headaches.  Hematological:  Does not bruise/bleed easily.  Psychiatric/Behavioral:  The patient is not nervous/anxious.    Blood pressure (!) 94/55, pulse 100, temperature 98.3 F (36.8 C), temperature source Oral, resp. rate 18, weight 56.7 kg, last menstrual period 08/23/2011, SpO2 99 %. Physical Exam Constitutional:      General: She is not in acute distress.    Appearance: She is well-developed. She is not diaphoretic.  HENT:     Head: Normocephalic and atraumatic.  Eyes:     General: No scleral icterus.       Right eye: No discharge.        Left eye: No discharge.     Conjunctiva/sclera: Conjunctivae normal.  Cardiovascular:     Rate and Rhythm: Normal rate and regular rhythm.  Pulmonary:     Effort: Pulmonary effort is normal. No respiratory distress.  Musculoskeletal:     Cervical back: Normal range of motion.     Comments: RLE No traumatic wounds, ecchymosis, or rash  Mild TTP knee  Mild knee effusion  Knee stable to varus/ valgus and anterior/posterior stress  Sens DPN, SPN, TN intact  Motor EHL, ext, flex, evers 5/5  DP 2+, PT 1+, No significant edema  Skin:    General: Skin is warm and dry.  Neurological:     Mental Status: She is alert.  Psychiatric:        Mood and Affect: Mood normal.        Behavior: Behavior normal.     Assessment/Plan: Right knee pain -- Will aspirate and inject.    Freeman Caldron,  PA-C Orthopedic Surgery [phone] 07/08/2022, 3:23 PM

## 2022-07-08 NOTE — Progress Notes (Signed)
Pt denies any alcohol abuse, refused Ativan despite explanation of increase heart rate, agitation, tremor, anxiety

## 2022-07-08 NOTE — Progress Notes (Signed)
[facility]                                                                              [name], is a 57 y.o. female, DOB - 20-Dec-1965, NFA:[id number] Admit date - 07/07/2022    Outpatient Primary MD for the patient is Pcp, No  LOS - 1  days  Chief Complaint  Patient presents with   Fatigue       Brief summary   Patient is a 57 year old female with history of alcohol abuse, seizure disorder, prior history of GI bleed, CKD stage IIIa, anemia of chronic disease, iron deficiency anemia HLP, GERD, history of gout and osteoarthritis right knee, follows orthopedics presented to ED with acute right knee pain.  Patient feels it is like a gout flare, also reported fatigue and tremors. Last alcohol intake was night before the admission, drank 2 beers and 4 shots of hard liquor. In ED, x-ray of the right knee showed nonerosive degenerative changes without evidence of fracture, bipartite patellar configuration.   Assessment & Plan    Principal Problem:   Acute pain of right knee -Patient has history of gout and right knee tricompartmental arthritis, follows orthopedics. -She feels pain typical of her gout flare, obtain ESR, CRP, uric acid.  Consulted Ortho for arthrocentesis and possible cortisone shot  Active Problems: Acute transaminitis -On admission, LFTs normal however noted AST elevated 2067, ALT 242, alk phos 155 this morning -Repeat LFTs, obtain right upper quadrant ultrasound, obtain hepatitis panel, CK -Increase IV fluid hydration, possibly due to hypotension, alcohol use  History of alcohol use with risk of acute alcohol withdrawals -Tremulous on exam likely from alcohol withdrawal. -Continue CIWA with Ativan, thiamine, folate  Mild AKI on CKD stage IIIa, NAGMA -Creatinine 1.26 on admission, was 0.9 on 06/29/2022 -Will place on sodium bicarb drip    Seizure (HCC) -Continue Keppra    Type 2 diabetes mellitus (HCC), uncontrolled with  hyperglycemia -Placed on sliding scale insulin while inpatient, obtain HbA1c -Hemoglobin A1c 4.6 on 04/27/2022    Hypomagnesemia -Replaced    Iron deficiency anemia H&H currently at baseline 10-11   Estimated body mass index is 22.86 kg/m as calculated from the following:   Height as of 06/25/22: 5\' 2"  (1.575 m).   Weight as of this encounter: 56.7 kg.  Code Status: full code  DVT Prophylaxis:  enoxaparin (LOVENOX) injection 40 mg Start: 07/08/22 1000   Level of Care: Level of care: Progressive Family Communication: Updated patient's family member at bed side  Disposition Plan:      Remains inpatient appropriate:     Procedures:   Consultants:   Orthopedics   Antimicrobials: none   Medications  allopurinol  100 mg Oral Daily   bupivacaine(PF)  10 mL Infiltration Once   enoxaparin (LOVENOX) injection  40 mg Subcutaneous Q24H   folic acid  1 mg Oral Daily   insulin aspart  0-5 Units Subcutaneous QHS   insulin aspart  0-9 Units Subcutaneous TID WC   levETIRAcetam  500 mg Oral BID   lidocaine  1 patch Transdermal Q24H   LORazepam  0-4 mg Intravenous Q4H  Followed by   Melene Muller ON 07/09/2022] LORazepam  0-4 mg Intravenous Q8H   methylPREDNISolone acetate  40 mg Intra-articular Once   multivitamin with minerals  1 tablet Oral Daily   pantoprazole  40 mg Oral BID AC   thiamine  100 mg Oral Daily   Or   thiamine  100 mg Intravenous Daily   venlafaxine XR  37.5 mg Oral Q breakfast      Subjective:   Christina Byrd was seen and examined today.  C/o right knee pain.  Patient denies dizziness, chest pain, shortness of breath, abdominal pain, N/V. No fevers  Objective:   Vitals:   07/08/22 0314 07/08/22 0757 07/08/22 0802 07/08/22 1145  BP: 115/64 120/76 120/76 (!) 94/55  Pulse: 100  100   Resp: 15  18   Temp: 98.5 F (36.9 C)  98.3 F (36.8 C)   TempSrc: Oral  Oral   SpO2: 97%  99%   Weight:        Intake/Output Summary (Last 24 hours) at 07/08/2022 1205 Last  data filed at 07/07/2022 2207 Gross per 24 hour  Intake 100 ml  Output --  Net 100 ml     Wt Readings from Last 3 Encounters:  07/07/22 56.7 kg  06/25/22 56.8 kg  06/03/22 56.8 kg     Exam General: Alert and oriented x 3, NAD Cardiovascular: S1 S2 auscultated,  RRR Respiratory: Clear to auscultation bilaterally, no wheezing Gastrointestinal: Soft, nontender, nondistended, + bowel sounds Ext: no pedal edema bilaterally, + right knee TTP, ROM dec dur to pain     Neuro: Strength 5/5 upper and lower extremities bilaterally Psych: Normal affect     Data Reviewed:  I have personally reviewed following labs    CBC Lab Results  Component Value Date   WBC 8.5 07/08/2022   RBC 3.45 (L) 07/08/2022   HGB 10.1 (L) 07/08/2022   HCT 30.9 (L) 07/08/2022   MCV 89.6 07/08/2022   MCH 29.3 07/08/2022   PLT 73 (L) 07/08/2022   MCHC 32.7 07/08/2022   RDW 16.7 (H) 07/08/2022   LYMPHSABS 1.4 07/07/2022   MONOABS 0.6 07/07/2022   EOSABS 0.0 07/07/2022   BASOSABS 0.0 07/07/2022     Last metabolic panel Lab Results  Component Value Date   NA 135 07/08/2022   K 4.2 07/08/2022   CL 105 07/08/2022   CO2 17 (L) 07/08/2022   BUN 8 07/08/2022   CREATININE 1.10 (H) 07/08/2022   GLUCOSE 245 (H) 07/08/2022   GFRNONAA 59 (L) 07/08/2022   GFRAA 61 01/02/2020   CALCIUM 7.9 (L) 07/08/2022   PHOS 2.9 06/29/2022   PROT 5.4 (L) 07/08/2022   ALBUMIN 2.2 (L) 07/08/2022   LABGLOB 3.6 04/27/2022   AGRATIO 1.0 (L) 04/27/2022   BILITOT 1.3 (H) 07/08/2022   ALKPHOS 155 (H) 07/08/2022   AST 2,067 (H) 07/08/2022   ALT 242 (H) 07/08/2022   ANIONGAP 13 07/08/2022    CBG (last 3)  No results for input(s): "GLUCAP" in the last 72 hours.    Coagulation Profile: No results for input(s): "INR", "PROTIME" in the last 168 hours.   Radiology Studies: I have personally reviewed the imaging studies  DG Knee Complete 4 Views Right  Result Date: 07/07/2022 CLINICAL DATA:  Right knee pain. EXAM:  RIGHT KNEE - COMPLETE 4+ VIEW COMPARISON:  AP Lat 02/11/2022. FINDINGS: There is normal bone mineralization without evidence of fracture, dislocation or joint effusions. 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 There is moderate  joint space loss and osteophytosis involving the medial femorotibial compartment and mild narrowing and spurring of the patellofemoral compartment. Spurring change again noted of the tibial spines. The lateral compartment space is maintained. Findings consistent with nonerosive degenerative arthrosis. There are mild enthesopathic changes of the anterior patella. Soft tissues are unremarkable. Comparison to the prior study reveals no significant interval change. IMPRESSION: Nonerosive degenerative changes without evidence of fractures. Bipartite patellar configuration. No significant interval change. Electronically Signed   By: Almira Bar M.D.   On: 07/07/2022 22:28       Akia Desroches M.D. [facility] 07/08/2022, 12:05 PM  Available via Epic secure chat 7am-7pm After 7 pm, please refer to night coverage provider listed on amion.

## 2022-07-08 NOTE — Procedures (Signed)
Procedure: Right knee aspiration and injection   Indication: Right knee effusion(s)   Surgeon: Charma Igo, PA-C   Assist: None   Anesthesia: Topical refrigerant   EBL: None   Complications: Dry tap   Findings: After risks/benefits explained patient desires to undergo procedure. Consent obtained and time out performed. The right knee was sterilely prepped and aspirated. No fluid was encountered. 6ml 0.5% Marcaine and 40mg  depo-medrol were instilled. Pt tolerated the procedure well.       Freeman Caldron, PA-C Orthopedic Surgery [phone]

## 2022-07-08 NOTE — H&P (Addendum)
History and Physical  Christina Byrd:[id number] DOB: Jul 22, 1965 DOA: 07/07/2022  Referring physician: Dr. Silverio Lay, EDP  PCP: Pcp, No  Outpatient Specialists: GI, orthopedic surgery. Patient coming from: Home.  Chief Complaint: Right knee pain  HPI: Christina Byrd is a 57 y.o. female with medical history significant for alcohol abuse, seizure disorder, history of upper GI bleed, CKD 3B, anemia of chronic disease, iron deficiency anemia, folic acid deficiency, hyperlipidemia, GERD, history of gout, chronic right knee pain secondary to right knee tricompartmental arthritis for which she follows with orthopedic surgery who presented to [facility] ED from home with complaints of right knee pain.  Feels like gout flare.  Also reports fatigue associated with tremors.  States last alcohol intake was last night.  Drank 2 beers and 4 shots of hard liquor.  In the ED, x-ray of right knee was done which revealed the following:  Nonerosive degenerative changes without evidence of fractures. Bipartite patellar configuration. No significant interval change.  Due to concern for alcohol withdrawal, [facility] was asked to admit.  ED Course: Temperature 98.5.  BP 115/64, pulse 100, respiratory 15, O2 saturation 97% on room air.  Lab studies notable for serum bicarb 18, glucose 331, creatinine 1.26, magnesium 1.3.  Albumin 2.7.  Lipase 140.  Review of Systems: Review of systems as noted in the HPI. All other systems reviewed and are negative.   Past Medical History:  Diagnosis Date   GASTROESOPHAGEAL REFLUX, NO ESOPHAGITIS 04/21/2006   Qualifier: Diagnosis of  By: Levada Schilling     GERD (gastroesophageal reflux disease) Dx 1995   HTN (hypertension)    Seizures (HCC)    Past Surgical History:  Procedure Laterality Date   BIOPSY  06/03/2022   Procedure: BIOPSY;  Surgeon: Jenel Lucks, MD;  Location: [facility] ENDOSCOPY;  Service: Gastroenterology;;   ESOPHAGOGASTRODUODENOSCOPY (EGD) WITH PROPOFOL N/A  06/03/2022   Procedure: ESOPHAGOGASTRODUODENOSCOPY (EGD) WITH PROPOFOL;  Surgeon: Jenel Lucks, MD;  Location: [facility] ENDOSCOPY;  Service: Gastroenterology;  Laterality: N/A;   HOT HEMOSTASIS N/A 06/03/2022   Procedure: HOT HEMOSTASIS (ARGON PLASMA COAGULATION/BICAP);  Surgeon: Jenel Lucks, MD;  Location: [facility] ENDOSCOPY;  Service: Gastroenterology;  Laterality: N/A;    Social History:  reports that she quit smoking about 10 years ago. Her smoking use included cigarettes. She has never used smokeless tobacco. She reports that she does not currently use alcohol after a past usage of about 49.0 standard drinks of alcohol per week. She reports that she does not use drugs.   Allergies  Allergen Reactions   Losartan Potassium     Rash   Levaquin [Levofloxacin In D5w] Rash   Ace Inhibitors Cough    REACTION: cough   Codeine Nausea Only   Cefepime Rash    09/04/12 pm Patient started to break out with small macules after IV Vanco infusion, then macules increased in size after starting cefepime infusion.   Vancomycin Rash    09/04/12 pm Patient started to break out with small macules after IV Vanco infusion, then macules increased in size after starting cefepime infusion.    Family History  Problem Relation Age of Onset   CAD Mother       Prior to Admission medications   Medication Sig Start Date End Date Taking? Authorizing Provider  allopurinol (ZYLOPRIM) 100 MG tablet Take 1 tablet (100 mg total) by mouth daily. 01/13/22   Grayce Sessions, NP  amLODipine (NORVASC) 10 MG tablet TAKE 1 TABLET(10 MG) BY MOUTH DAILY 05/22/22  Grayce Sessions, NP  atorvastatin (LIPITOR) 20 MG tablet TAKE 1 TABLET(20 MG) BY MOUTH DAILY 03/12/22   Grayce Sessions, NP  Blood Glucose Monitoring Suppl (TRUE METRIX METER) DEVI 1 kit by Does not apply route 3 (three) times daily. CHECK BLOOD SUGAR UP TO 3 TIMES DAILY. E11.9 02/27/19   Hoy Register, MD  chlordiazePOXIDE (LIBRIUM) 10 MG capsule Please  take 1 capsule (10 mg) by mouth 3 times daily for 3 days, then 1 capsule (10 mg) by mouth 2 times daily for 3 days, then 1 capsule (10 mg) daily for 3 days then stop. 06/29/22   Elgergawy, Leana Roe, MD  clindamycin (CLEOCIN T) 1 % external solution Apply topically 2 (two) times daily.    [provider]  fenofibrate (TRICOR) 48 MG tablet Take 1 tablet (48 mg total) by mouth daily. 04/28/22   Grayce Sessions, NP  ferrous sulfate 325 (65 FE) MG tablet Take 1 tablet (325 mg total) by mouth 2 (two) times daily with a meal. 06/05/22   Leroy Sea, MD  fluticasone (FLONASE) 50 MCG/ACT nasal spray USE 2 SPRAYS IN EACH NOSTRIL DAILY 06/25/21   Hoy Register, MD  folic acid (FOLVITE) 1 MG tablet Take 1 tablet (1 mg total) by mouth daily. 06/05/22   Leroy Sea, MD  glucose blood (TRUE METRIX BLOOD GLUCOSE TEST) test strip Use as instructed 03/06/20   Hoy Register, MD  ketoconazole (NIZORAL) 2 % cream Apply 1 Application topically daily.    [provider]  levETIRAcetam (KEPPRA) 500 MG tablet Take 1 tablet (500 mg total) by mouth 2 (two) times daily. 06/29/22 07/29/22  Elgergawy, Leana Roe, MD  magnesium oxide (MAG-OX) 400 (240 Mg) MG tablet TAKE 1 TABLET BY MOUTH EVERY DAY 06/24/21   Hoy Register, MD  omega-3 acid ethyl esters (LOVAZA) 1 g capsule Take 2 capsules (2 g total) by mouth 2 (two) times daily. 10/06/21   Hoy Register, MD  ondansetron (ZOFRAN-ODT) 4 MG disintegrating tablet Take 1 tablet (4 mg total) by mouth every 8 (eight) hours as needed for nausea or vomiting. 05/29/22   Rancour, Jeannett Senior, MD  pantoprazole (PROTONIX) 40 MG tablet Take 1 tablet (40 mg total) by mouth 2 (two) times daily before a meal. 06/05/22   Leroy Sea, MD  thiamine (VITAMIN B-1) 100 MG tablet Take 1 tablet (100 mg total) by mouth daily. 07/28/20   Van Clines, MD  tiZANidine (ZANAFLEX) 4 MG tablet TAKE 1 TABLET BY MOUTH EVERY 8 HOURS AS NEEDED FOR MUSCLE SPASMS 11/02/21   Hoy Register, MD   TRUEplus Lancets 28G MISC SMARTSIG:Topical 1 to 3 Times Daily 03/06/20   Hoy Register, MD  venlafaxine XR (EFFEXOR XR) 37.5 MG 24 hr capsule Take 1 capsule (37.5 mg total) by mouth daily with breakfast. For hot flashes 10/05/21   Hoy Register, MD  vitamin B-12 (CYANOCOBALAMIN) 1000 MCG tablet Take 1 tablet (1,000 mcg total) by mouth daily. 07/28/20   Van Clines, MD  metFORMIN (GLUCOPHAGE) 500 MG tablet Take 0.5 tablets (250 mg total) by mouth daily with breakfast. 03/05/20 04/02/20  Anders Simmonds, PA-C    Physical Exam: BP 115/64 (BP Location: Right Arm)   Pulse 100   Temp 98.5 F (36.9 C) (Oral)   Resp 15   Wt 56.7 kg   LMP 08/23/2011   SpO2 97%   BMI 22.86 kg/m   General: 57 y.o. year-old female well developed well nourished in no acute distress.  Alert  and oriented x3.  Tremulous Cardiovascular: Regular rate and rhythm with no rubs or gallops.  No thyromegaly or JVD noted.  No lower extremity edema. 2/4 pulses in all 4 extremities. Respiratory: Clear to auscultation with no wheezes or rales. Good inspiratory effort. Abdomen: Soft nontender nondistended with normal bowel sounds x4 quadrants. Muskuloskeletal: No cyanosis, clubbing or edema noted bilaterally Neuro: CN II-XII intact, strength, sensation, reflexes Skin: No ulcerative lesions noted or rashes Psychiatry: Judgement and insight appear normal. Mood is appropriate for condition and setting          Labs on Admission:  Basic Metabolic Panel: Recent Labs  Lab 07/07/22 2123  NA 136  K 4.1  CL 103  CO2 18*  GLUCOSE 331*  BUN 10  CREATININE 1.26*  CALCIUM 8.0*  MG 1.3*   Liver Function Tests: Recent Labs  Lab 07/07/22 2123  AST 41  ALT 14  ALKPHOS 89  BILITOT 0.3  PROT 6.3*  ALBUMIN 2.7*   Recent Labs  Lab 07/07/22 2123  LIPASE 140*   No results for input(s): "AMMONIA" in the last 168 hours. CBC: Recent Labs  Lab 07/07/22 2123  WBC 5.8  NEUTROABS 3.8  HGB 11.1*  HCT 35.5*  MCV 93.4   PLT 227   Cardiac Enzymes: No results for input(s): "CKTOTAL", "CKMB", "CKMBINDEX", "TROPONINI" in the last 168 hours.  BNP (last 3 results) Recent Labs    06/05/22 0249  BNP 42.9    ProBNP (last 3 results) No results for input(s): "PROBNP" in the last 8760 hours.  CBG: No results for input(s): "GLUCAP" in the last 168 hours.  Radiological Exams on Admission: DG Knee Complete 4 Views Right  Result Date: 07/07/2022 CLINICAL DATA:  Right knee pain. EXAM: RIGHT KNEE - COMPLETE 4+ VIEW COMPARISON:  AP Lat 02/11/2022. FINDINGS: There is normal bone mineralization without evidence of fracture, dislocation or joint effusions. 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 There is moderate joint space loss and osteophytosis involving the medial femorotibial compartment and mild narrowing and spurring of the patellofemoral compartment. Spurring change again noted of the tibial spines. The lateral compartment space is maintained. Findings consistent with nonerosive degenerative arthrosis. There are mild enthesopathic changes of the anterior patella. Soft tissues are unremarkable. Comparison to the prior study reveals no significant interval change. IMPRESSION: Nonerosive degenerative changes without evidence of fractures. Bipartite patellar configuration. No significant interval change. Electronically Signed   By: Almira Bar M.D.   On: 07/07/2022 22:28    EKG: I independently viewed the EKG done and my findings are as followed: Sinus tachycardia rate of 133.  Nonspecific ST-T changes.  QTc 562.  Assessment/Plan Present on Admission:  Alcohol withdrawal (HCC)  Principal Problem:   Alcohol withdrawal (HCC)  History of alcohol abuse with concern for alcohol withdrawal Tremulous on exam likely from alcohol withdrawal CIWA protocol in place Continue multivitamin, thiamine and folic acid supplement TOC consulted to provide resources for complete alcohol cessation  Right knee pain  likely secondary toright knee tricompartmental arthritis Follows with orthopedic surgery Analgesics as needed  Elevated lipase likely secondary to alcohol abuse Initial lipase level 140 Repeat lipase level, could be developing acute pancreatitis IV fluid hydration  QTC prolongation Admission twelve-lead EKG with QTc of 562 Avoid QTc prolonging agents Optimize magnesium and potassium levels, magnesium greater than 2.0, potassium greater than 4.0  AKI, likely prerenal in the setting of dehydration from alcohol abuse Baseline creatinine 0.9 GFR greater than 60 Presented with creatinine 1.26 with GFR  of 50 IV fluid hydration Avoid nephrotoxic agent, dehydration and hypotension Repeat BMP in the morning  Anion gap metabolic acidosis Serum bicarb 18 Anion gap 15 IV fluid hydration Repeat chemistry panel  Elevated liver chemistries secondary to alcohol abuse AST to ALT 2:1 ratio Monitor LFTs  Hypomagnesemia Serum magnesium 1.3 Repleted intravenously, IV mag 4 g. Repeat magnesium level in the morning  Hyperglycemia Last hemoglobin A1c 4.6 on 04/27/2022 Monitor with BMP  Seizure disorder Resume home Keppra Seizure precaution  History of gout Resume home allopurinol   DVT prophylaxis: Subcu Lovenox daily  Code Status: Full code  Family Communication: None at bedside  Disposition Plan: Admitted to progressive care unit  Consults called: None.  Admission status: Inpatient status.   Status is: Inpatient The patient requires at least 2 midnights for further evaluation and treatment of present condition.   Darlin Drop MD [facility] Pager [phone]  If 7PM-7AM, please contact night-coverage [email or web address] Password [email or web address]  07/08/2022, 6:18 AM

## 2022-07-09 DIAGNOSIS — R7989 Other specified abnormal findings of blood chemistry: Secondary | ICD-10-CM | POA: Diagnosis not present

## 2022-07-09 DIAGNOSIS — R7401 Elevation of levels of liver transaminase levels: Secondary | ICD-10-CM

## 2022-07-09 DIAGNOSIS — M25561 Pain in right knee: Secondary | ICD-10-CM | POA: Diagnosis not present

## 2022-07-09 DIAGNOSIS — R569 Unspecified convulsions: Secondary | ICD-10-CM | POA: Diagnosis not present

## 2022-07-09 DIAGNOSIS — S36118A Other injury of liver, initial encounter: Secondary | ICD-10-CM | POA: Diagnosis not present

## 2022-07-09 DIAGNOSIS — F10930 Alcohol use, unspecified with withdrawal, uncomplicated: Secondary | ICD-10-CM | POA: Diagnosis not present

## 2022-07-09 LAB — COMPREHENSIVE METABOLIC PANEL
ALT: 1725 U/L — ABNORMAL HIGH (ref 0–44)
AST: >10000 U/L — ABNORMAL HIGH (ref 15–41)
Albumin: 2.5 g/dL — ABNORMAL LOW (ref 3.5–5.0)
Alkaline Phosphatase: 184 U/L — ABNORMAL HIGH (ref 38–126)
Anion gap: 10 (ref 5–15)
BUN: 8 mg/dL (ref 6–20)
CO2: 28 mmol/L (ref 22–32)
Calcium: 8.7 mg/dL — ABNORMAL LOW (ref 8.9–10.3)
Chloride: 97 mmol/L — ABNORMAL LOW (ref 98–111)
Creatinine, Ser: 1.14 mg/dL — ABNORMAL HIGH (ref 0.44–1.00)
GFR, Estimated: 56 mL/min — ABNORMAL LOW (ref >60)
Glucose, Bld: 151 mg/dL — ABNORMAL HIGH (ref 70–99)
Potassium: 3.8 mmol/L (ref 3.5–5.1)
Sodium: 135 mmol/L (ref 135–145)
Total Bilirubin: 2.9 mg/dL — ABNORMAL HIGH (ref 0.3–1.2)
Total Protein: 6.2 g/dL — ABNORMAL LOW (ref 6.5–8.1)

## 2022-07-09 LAB — CBC
HCT: 30.7 % — ABNORMAL LOW (ref 36.0–46.0)
Hemoglobin: 10.1 g/dL — ABNORMAL LOW (ref 12.0–15.0)
MCH: 28.5 pg (ref 26.0–34.0)
MCHC: 32.9 g/dL (ref 30.0–36.0)
MCV: 86.5 fL (ref 80.0–100.0)
Platelets: 77 10*3/uL — ABNORMAL LOW (ref 150–400)
RBC: 3.55 MIL/uL — ABNORMAL LOW (ref 3.87–5.11)
RDW: 16.3 % — ABNORMAL HIGH (ref 11.5–15.5)
WBC: 9.4 10*3/uL (ref 4.0–10.5)
nRBC: 0 % (ref 0.0–0.2)

## 2022-07-09 LAB — RAPID URINE DRUG SCREEN, HOSP PERFORMED
Amphetamines: NOT DETECTED
Barbiturates: NOT DETECTED
Benzodiazepines: POSITIVE — AB
Cocaine: NOT DETECTED
Opiates: NOT DETECTED
Tetrahydrocannabinol: NOT DETECTED

## 2022-07-09 LAB — GLUCOSE, CAPILLARY
Glucose-Capillary: 185 mg/dL — ABNORMAL HIGH (ref 70–99)
Glucose-Capillary: 215 mg/dL — ABNORMAL HIGH (ref 70–99)
Glucose-Capillary: 243 mg/dL — ABNORMAL HIGH (ref 70–99)
Glucose-Capillary: 158 mg/dL — ABNORMAL HIGH (ref 70–99)

## 2022-07-09 LAB — ACETAMINOPHEN LEVEL: Acetaminophen (Tylenol), Serum: <10 ug/mL — ABNORMAL LOW (ref 10–30)

## 2022-07-09 LAB — PROTIME-INR
INR: 2.2 — ABNORMAL HIGH (ref 0.8–1.2)
Prothrombin Time: 24.9 seconds — ABNORMAL HIGH (ref 11.4–15.2)

## 2022-07-09 MED ORDER — LACTATED RINGERS IV SOLN: INTRAVENOUS | Status: AC

## 2022-07-09 MED ORDER — DEXTROSE 5 % IV SOLN
6.2500 mg/kg/h | INTRAVENOUS | Status: AC
  Administered 2022-07-09 – 2022-07-10 (×2): 6.25 mg/kg/h via INTRAVENOUS
  Filled 2022-07-09: qty 90

## 2022-07-09 MED ORDER — DEXTROSE 5 % IV SOLN
12.5000 mg/kg/h | INTRAVENOUS | Status: AC
  Administered 2022-07-09: 12.5 mg/kg/h via INTRAVENOUS
  Filled 2022-07-09: qty 90

## 2022-07-09 MED ORDER — ONDANSETRON HCL 4 MG/2ML IJ SOLN: 4.0000 mg | Freq: Four times a day (QID) | INTRAMUSCULAR | Status: DC | PRN

## 2022-07-09 MED ORDER — HYDROCORTISONE 1 % EX CREA
TOPICAL_CREAM | CUTANEOUS | Status: DC | PRN
  Filled 2022-07-09: qty 28

## 2022-07-09 MED ORDER — DEXTROSE 5 % IV SOLN: 15.0000 mg/kg/h | INTRAVENOUS | Status: DC

## 2022-07-09 MED ORDER — ACETYLCYSTEINE LOAD VIA INFUSION
150.0000 mg/kg | Freq: Once | INTRAVENOUS | Status: AC
  Administered 2022-07-09: 8505 mg via INTRAVENOUS
  Filled 2022-07-09: qty 279

## 2022-07-09 NOTE — Progress Notes (Signed)
[facility]                                                                              Christina Byrd, is a 57 y.o. female, DOB - 1966-02-15, ZOX:[id number] Admit date - 07/07/2022    Outpatient Primary MD for the patient is Pcp, No  LOS - 2  days  Chief Complaint  Patient presents with   Fatigue       Brief summary   Patient is a 57 year old female with history of alcohol abuse, seizure disorder, prior history of GI bleed, CKD stage IIIa, anemia of chronic disease, iron deficiency anemia HLP, GERD, history of gout and osteoarthritis right knee, follows orthopedics presented to ED with acute right knee pain.  Patient feels it is like a gout flare, also reported fatigue and tremors. Last alcohol intake was night before the admission, drank 2 beers and 4 shots of hard liquor. In ED, x-ray of the right knee showed nonerosive degenerative changes without evidence of fracture, bipartite patellar configuration.   Assessment & Plan    Principal Problem:   Acute pain of right knee -Patient has history of gout and right knee tricompartmental arthritis, follows orthopedics. -s/p arthrocentesis and cortisone shot by orthopedics on 5/16. -Feels a lot better today with right knee pain, close to her baseline   Active Problems: Acute transaminitis, unclear etiology -On admission, LFTs normal, however has been trending up since admission.   -AST 41-> 2075-> >10,000 ALT 14-> 239-> 1725 Alk phos 89-> 154-> 184 -Total bili 0.3-> 1.3-> 2.9 -CK normal -Acute hepatitis panel negative -CRP 0.7, ESR 19 -Right upper quadrant ultrasound showed steatosis,?  Cirrhosis, diffuse nonspecific gallbladder wall thickening without Murphy sign, trace perihepatic ascites -Discontinue venlafaxine due to s/e of hepatotoxicity -GI consulted, will await evaluation  History of alcohol use with risk of acute alcohol withdrawals -continue CIWA with Ativan -Continue thiamine,  folate -Currently stable, not in any acute withdrawals  Mild AKI on CKD stage IIIa, NAGMA -Creatinine 1.26 on admission, was 0.9 on 06/29/2022 -Continue IVF with Ringer lactate.  Bicarb drip discontinued, CO2 improved    Seizure (HCC) -Continue Keppra    Type 2 diabetes mellitus (HCC), uncontrolled with hyperglycemia -Placed on sliding scale insulin while inpatient, obtain HbA1c -Hemoglobin A1c 4.6 on 04/27/2022    Iron deficiency anemia H&H currently at baseline 10-11   Estimated body mass index is 22.86 kg/m as calculated from the following:   Height as of 06/25/22: 5\' 2"  (1.575 m).   Weight as of this encounter: 56.7 kg.  Code Status: full code  DVT Prophylaxis:  enoxaparin (LOVENOX) injection 40 mg Start: 07/08/22 1000   Level of Care: Level of care: Progressive Family Communication: Updated patient's family members at the bedside Disposition Plan:      Remains inpatient appropriate:     Procedures:   Consultants:   Orthopedics  Gastroenterology  Antimicrobials: none   Medications  allopurinol  100 mg Oral Daily   enoxaparin (LOVENOX) injection  40 mg Subcutaneous Q24H   folic acid  1 mg Oral Daily   insulin aspart  0-5 Units Subcutaneous QHS   insulin aspart  0-9 Units Subcutaneous TID WC   levETIRAcetam  500 mg Oral BID   lidocaine  1 patch Transdermal Q24H   LORazepam  0-4 mg Intravenous Q4H   Followed by   LORazepam  0-4 mg Intravenous Q8H   multivitamin with minerals  1 tablet Oral Daily   pantoprazole  40 mg Oral BID AC   thiamine  100 mg Oral Daily   Or   thiamine  100 mg Intravenous Daily   venlafaxine XR  37.5 mg Oral Q breakfast      Subjective:   Christina Byrd was seen and examined today.  Currently no nausea vomiting or abdominal pain.  Alert and oriented x 3, family members at bedside.  Patient reports she feels close to her baseline after her right knee pain has improved with the cortisone shot.    Objective:   Vitals:   07/09/22 0332  07/09/22 0758 07/09/22 0800 07/09/22 1118  BP: 127/62 (!) 149/88 (!) 149/88 115/79  Pulse: 92 83  90  Resp: 16 15  16   Temp:  98.1 F (36.7 C)  98.1 F (36.7 C)  TempSrc:  Oral  Oral  SpO2: 95% 91%  97%  Weight:        Intake/Output Summary (Last 24 hours) at 07/09/2022 1326 Last data filed at 07/09/2022 0347 Gross per 24 hour  Intake 1173.86 ml  Output --  Net 1173.86 ml     Wt Readings from Last 3 Encounters:  07/07/22 56.7 kg  06/25/22 56.8 kg  06/03/22 56.8 kg   Physical Exam General: Alert and oriented x 3, NAD Cardiovascular: S1 S2 clear, RRR.  Respiratory: CTAB, Gastrointestinal: Soft, nontender, nondistended, NBS Ext: no pedal edema bilaterally Neuro: no new deficits Psych: Normal affect    Data Reviewed:  I have personally reviewed following labs    CBC Lab Results  Component Value Date   WBC 8.5 07/08/2022   RBC 3.45 (L) 07/08/2022   HGB 10.1 (L) 07/08/2022   HCT 30.9 (L) 07/08/2022   MCV 89.6 07/08/2022   MCH 29.3 07/08/2022   PLT 73 (L) 07/08/2022   MCHC 32.7 07/08/2022   RDW 16.7 (H) 07/08/2022   LYMPHSABS 1.4 07/07/2022   MONOABS 0.6 07/07/2022   EOSABS 0.0 07/07/2022   BASOSABS 0.0 07/07/2022     Last metabolic panel Lab Results  Component Value Date   NA 135 07/09/2022   K 3.8 07/09/2022   CL 97 (L) 07/09/2022   CO2 28 07/09/2022   BUN 8 07/09/2022   CREATININE 1.14 (H) 07/09/2022   GLUCOSE 151 (H) 07/09/2022   GFRNONAA 56 (L) 07/09/2022   GFRAA 61 01/02/2020   CALCIUM 8.7 (L) 07/09/2022   PHOS 2.9 06/29/2022   PROT 6.2 (L) 07/09/2022   ALBUMIN 2.5 (L) 07/09/2022   LABGLOB 3.6 04/27/2022   AGRATIO 1.0 (L) 04/27/2022   BILITOT 2.9 (H) 07/09/2022   ALKPHOS 184 (H) 07/09/2022   AST >10,000 (H) 07/09/2022   ALT 1,725 (H) 07/09/2022   ANIONGAP 10 07/09/2022    CBG (last 3)  Recent Labs    07/08/22 2131 07/09/22 0756 07/09/22 1115  GLUCAP 173* 185* 215*      Coagulation Profile: No results for input(s): "INR",  "PROTIME" in the last 168 hours.   Radiology Studies: I have personally reviewed the imaging studies  US Abdomen Limited RUQ (LIVER/GB)  Addendum Date: 07/08/2022   ADDENDUM REPORT: 07/08/2022 16:46 ADDENDUM: Right kidney may be slightly echogenic, correlate with appropriate laboratory values. Electronically Signed  By: Jasmine Pang M.D.   On: 07/08/2022 16:46   Result Date: 07/08/2022 CLINICAL DATA:  Transaminitis EXAM: ULTRASOUND ABDOMEN LIMITED RIGHT UPPER QUADRANT COMPARISON:  CT 05/29/2022 FINDINGS: Gallbladder: No shadowing stones. Diffuse gallbladder wall thickening measuring up to 7.8 mm. Negative sonographic Murphy. Common bile duct: Diameter: 4.3 mm Liver: Echogenic liver parenchyma. Question subtle surface nodularity. No focal hepatic abnormality. Portal vein is patent on color Doppler imaging with normal direction of blood flow towards the liver. Other: Trace perihepatic ascites. IMPRESSION: 1. Echogenic liver parenchyma consistent with steatosis. Question subtle surface nodularity as may be seen with cirrhosis. 2. Diffuse nonspecific gallbladder wall thickening without sonographic Murphy. 3. Trace perihepatic ascites. Electronically Signed: By: Jasmine Pang M.D. On: 07/08/2022 16:43   DG Knee Complete 4 Views Right  Result Date: 07/07/2022 CLINICAL DATA:  Right knee pain. EXAM: RIGHT KNEE - COMPLETE 4+ VIEW COMPARISON:  AP Lat 02/11/2022. FINDINGS: There is normal bone mineralization without evidence of fracture, dislocation or joint effusions. 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 There is moderate joint space loss and osteophytosis involving the medial femorotibial compartment and mild narrowing and spurring of the patellofemoral compartment. Spurring change again noted of the tibial spines. The lateral compartment space is maintained. Findings consistent with nonerosive degenerative arthrosis. There are mild enthesopathic changes of the anterior patella. Soft tissues are  unremarkable. Comparison to the prior study reveals no significant interval change. IMPRESSION: Nonerosive degenerative changes without evidence of fractures. Bipartite patellar configuration. No significant interval change. Electronically Signed   By: Almira Bar M.D.   On: 07/07/2022 22:28       Mikaiah Stoffer M.D. [facility] 07/09/2022, 1:26 PM  Available via Epic secure chat 7am-7pm After 7 pm, please refer to night coverage provider listed on amion.

## 2022-07-09 NOTE — Plan of Care (Signed)

## 2022-07-09 NOTE — Evaluation (Signed)
Physical Therapy Evaluation Patient Details Name: Christina Byrd MRN: [id number] DOB: 11-Jul-1965 Today's Date: 07/09/2022  History of Present Illness  The pt is a 57 yo female presenting with fatigue, reports she was sent from UC for anemia. Work up revealed CIWA of 9, admitted for alcohol withdrawal. PMH includes: HTN, seizure, GI bleed, CKD III, anemia, gout, R knee arthritis, and alcohol abuse.   Clinical Impression  Pt in bed upon arrival of PT, agreeable to evaluation at this time. 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 She now presents with minor limitations in functional mobility, and dynamic stability, but reports she is back to her baseline after improvement in R knee pain. The pt was able to demo good independence with transfers, hallway ambulation, and stair navigation. She does have access to DME, although demos no current need for assistance or support. No further acute PT needs identified, pt is safe to return home with family support. Please re-consult if change in status or need.     Recommendations for follow up therapy are one component of a multi-disciplinary discharge planning process, led by the attending physician.  Recommendations may be updated based on patient status, additional functional criteria and insurance authorization.  Follow Up Recommendations       Assistance Recommended at Discharge PRN  Patient can return home with the following  Assist for transportation    Equipment Recommendations None recommended by PT  Recommendations for Other Services       Functional Status Assessment Patient has not had a recent decline in their functional status     Precautions / Restrictions Precautions Precautions: Fall Restrictions Weight Bearing Restrictions: No      Mobility  Bed Mobility Overal bed mobility: Independent                  Transfers Overall transfer level: Modified  independent Equipment used: None               General transfer comment: supervision in session for lines    Ambulation/Gait Ambulation/Gait assistance: Supervision Gait Distance (Feet): 350 Feet Assistive device: None Gait Pattern/deviations: WFL(Within Functional Limits), Trunk flexed Gait velocity: 0.45 m/s Gait velocity interpretation: 1.31 - 2.62 ft/sec, indicative of limited community ambulator   General Gait Details: pt states at baseline, slightly narrow BOS and increased sway, no overt LOB.  Stairs Stairs: Yes Stairs assistance: Supervision Stair Management: One rail Right, Step to pattern, Forwards Number of Stairs: 3    Wheelchair Mobility    Modified Rankin (Stroke Patients Only)       Balance Overall balance assessment: Mild deficits observed, not formally tested                                           Pertinent Vitals/Pain Pain Assessment Pain Assessment: No/denies pain    Home Living Family/patient expects to be discharged to:: Private residence Living Arrangements: Spouse/significant other Available Help at Discharge: Family;Available 24 hours/day Type of Home: House Home Access: Stairs to enter Entrance Stairs-Rails: Right;Left;Can reach both Entrance Stairs-Number of Steps: 3   Home Layout: One level Home Equipment: Agricultural consultant (2 wheels);Crutches;Shower seat;Wheelchair - manual Additional Comments: Has access to a lot of DME as they clean out SNF and get leftover DME patients do not take    Prior Function Prior Level of Function :  Independent/Modified Independent;Working/employed;Driving             Mobility Comments: independent with all mobility, employed and drives, denies any recent falls ADLs Comments: independent, 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)     [employer]   Dominant Hand: Right    Extremity/Trunk Assessment   Upper Extremity Assessment Upper  Extremity Assessment: Defer to OT evaluation    Lower Extremity Assessment Lower Extremity Assessment: Overall WFL for tasks assessed (reports R knee pain resolived, good strength and ROm)    Cervical / Trunk Assessment Cervical / Trunk Assessment: Normal  Communication   Communication: No difficulties  Cognition Arousal/Alertness: Awake/alert Behavior During Therapy: WFL for tasks assessed/performed Overall Cognitive Status: Within Functional Limits for tasks assessed                                          General Comments General comments (skin integrity, edema, etc.): VSS on RA, pt reports improvements in R knee pain    Exercises     Assessment/Plan    PT Assessment Patient does not need any further PT services         PT Goals (Current goals can be found in the Care Plan section)  Acute Rehab PT Goals Patient Stated Goal: return home to see her dogs PT Goal Formulation: All assessment and education complete, DC therapy Time For Goal Achievement: 07/16/22 Potential to Achieve Goals: Good     AM-PAC PT "6 Clicks" Mobility  Outcome Measure Help needed turning from your back to your side while in a flat bed without using bedrails?: None Help needed moving from lying on your back to sitting on the side of a flat bed without using bedrails?: None Help needed moving to and from a bed to a chair (including a wheelchair)?: None Help needed standing up from a chair using your arms (e.g., wheelchair or bedside chair)?: None Help needed to walk in hospital room?: A Little Help needed climbing 3-5 steps with a railing? : A Little 6 Click Score: 22    End of Session Equipment Utilized During Treatment: Gait belt Activity Tolerance: Patient tolerated treatment well Patient left: in chair;with call bell/phone within reach;with chair alarm set Nurse Communication: Mobility status PT Visit Diagnosis: Other abnormalities of gait and mobility (R26.89)     Time: 0981-1914 PT Time Calculation (min) (ACUTE ONLY): 13 min   Charges:   PT Evaluation $PT Eval Low Complexity: 1 Low          Vickki Muff, PT, DPT   Acute Rehabilitation Department Office [phone] Secure Chat Communication Preferred  Ronnie Derby 07/09/2022, 10:53 AM

## 2022-07-09 NOTE — Progress Notes (Signed)
  Transition of Care [facility]) Screening Note   Patient Details  Name: Christina Byrd Date of Birth: [birth year]   Transition of Care [facility]) CM/SW Contact:    Darrold Span, RN Phone Number: 07/09/2022, 2:26 PM    Transition of Care Department [facility]) has reviewed patient and no TOC needs have been identified at this time. We will continue to monitor patient advancement through interdisciplinary progression rounds. If new patient transition needs arise, please place a TOC consult. Substance abuse resources have been added to AVS

## 2022-07-09 NOTE — Consult Note (Addendum)
Referring Provider: [facility] Primary Care Physician:  Pcp, No Primary Gastroenterologist:  Gentry Fitz, seen by Dr. [name] first as inpatient last month  Reason for Consultation:   Elevated LFTs  HPI: Christina Byrd is a 57 y.o. female with history of alcohol abuse, seizure disorder, prior history of GI bleed (recent EGD), CKD stage IIIa, anemia of chronic disease, iron deficiency anemia, GERD, history of gout and osteoarthritis right knee for which follows with orthopedics and presented to ED with acute right knee pain.  Last alcohol intake was night before the admission, drank 2 beers and 4 shots of hard liquor, which is her normal.  She denies any street drugs.  Says she only takes the medications that are prescribed.  She did take 3 Tylenol in the morning and 3 Tylenol in the evening on Tuesday and followed the evening Tylenol by 2 beers and her 4 shots of liquor.  No abdominal pain or any GI complaints at this time.  LFTs were normal two days ago.  Today AST is greater than 10,000, ALT 1725, alk phos 185, total bili 2.9.  These are up significantly from yesterday.  Viral hepatitis studies are negative.  Ultrasound:  IMPRESSION: 1. Echogenic liver parenchyma consistent with steatosis. Question subtle surface nodularity as may be seen with cirrhosis. 2. Diffuse nonspecific gallbladder wall thickening without sonographic Murphy. 3. Trace perihepatic ascites.  Past Medical History:  Diagnosis Date   GASTROESOPHAGEAL REFLUX, NO ESOPHAGITIS 04/21/2006   Qualifier: Diagnosis of  By: Levada Schilling     GERD (gastroesophageal reflux disease) Dx 1995   HTN (hypertension)    Seizures (HCC)     Past Surgical History:  Procedure Laterality Date   BIOPSY  06/03/2022   Procedure: BIOPSY;  Surgeon: Jenel Lucks, MD;  Location: [facility] ENDOSCOPY;  Service: Gastroenterology;;   ESOPHAGOGASTRODUODENOSCOPY (EGD) WITH PROPOFOL N/A 06/03/2022   Procedure: ESOPHAGOGASTRODUODENOSCOPY (EGD) WITH  PROPOFOL;  Surgeon: Jenel Lucks, MD;  Location: [facility] ENDOSCOPY;  Service: Gastroenterology;  Laterality: N/A;   HOT HEMOSTASIS N/A 06/03/2022   Procedure: HOT HEMOSTASIS (ARGON PLASMA COAGULATION/BICAP);  Surgeon: Jenel Lucks, MD;  Location[facility] ENDOSCOPY;  Service: Gastroenterology;  Laterality: N/A;    Prior to Admission medications   Medication Sig Start Date End Date Taking? Authorizing Provider  allopurinol (ZYLOPRIM) 100 MG tablet Take 1 tablet (100 mg total) by mouth daily. 01/13/22   Grayce Sessions, NP  amLODipine (NORVASC) 10 MG tablet TAKE 1 TABLET(10 MG) BY MOUTH DAILY 05/22/22   Grayce Sessions, NP  atorvastatin (LIPITOR) 20 MG tablet TAKE 1 TABLET(20 MG) BY MOUTH DAILY 03/12/22   Grayce Sessions, NP  Blood Glucose Monitoring Suppl (TRUE METRIX METER) DEVI 1 kit by Does not apply route 3 (three) times daily. CHECK BLOOD SUGAR UP TO 3 TIMES DAILY. E11.9 02/27/19   Hoy Register, MD  chlordiazePOXIDE (LIBRIUM) 10 MG capsule Please take 1 capsule (10 mg) by mouth 3 times daily for 3 days, then 1 capsule (10 mg) by mouth 2 times daily for 3 days, then 1 capsule (10 mg) daily for 3 days then stop. 06/29/22   Elgergawy, Leana Roe, MD  clindamycin (CLEOCIN T) 1 % external solution Apply topically 2 (two) times daily.    [provider]  fenofibrate (TRICOR) 48 MG tablet Take 1 tablet (48 mg total) by mouth daily. 04/28/22   Grayce Sessions, NP  ferrous sulfate 325 (65 FE) MG tablet Take 1 tablet (325 mg total) by mouth 2 (two) times  daily with a meal. 06/05/22   Leroy Sea, MD  fluticasone [facility]) 50 MCG/ACT nasal spray USE 2 SPRAYS IN EACH NOSTRIL DAILY 06/25/21   Hoy Register, MD  folic acid (FOLVITE) 1 MG tablet Take 1 tablet (1 mg total) by mouth daily. 06/05/22   Leroy Sea, MD  glucose blood (TRUE METRIX BLOOD GLUCOSE TEST) test strip Use as instructed 03/06/20   Hoy Register, MD  ketoconazole (NIZORAL) 2 % cream Apply 1 Application  topically daily.    [provider]  levETIRAcetam (KEPPRA) 500 MG tablet Take 1 tablet (500 mg total) by mouth 2 (two) times daily. 06/29/22 07/29/22  Elgergawy, Leana Roe, MD  magnesium oxide (MAG-OX) 400 (240 Mg) MG tablet TAKE 1 TABLET BY MOUTH EVERY DAY 06/24/21   Hoy Register, MD  omega-3 acid ethyl esters (LOVAZA) 1 g capsule Take 2 capsules (2 g total) by mouth 2 (two) times daily. 10/06/21   Hoy Register, MD  ondansetron (ZOFRAN-ODT) 4 MG disintegrating tablet Take 1 tablet (4 mg total) by mouth every 8 (eight) hours as needed for nausea or vomiting. 05/29/22   Rancour, Jeannett Senior, MD  pantoprazole (PROTONIX) 40 MG tablet Take 1 tablet (40 mg total) by mouth 2 (two) times daily before a meal. 06/05/22   Leroy Sea, MD  thiamine (VITAMIN B-1) 100 MG tablet Take 1 tablet (100 mg total) by mouth daily. 07/28/20   Van Clines, MD  tiZANidine (ZANAFLEX) 4 MG tablet TAKE 1 TABLET BY MOUTH EVERY 8 HOURS AS NEEDED FOR MUSCLE SPASMS 11/02/21   Hoy Register, MD  TRUEplus Lancets 28G MISC SMARTSIG:Topical 1 to 3 Times Daily 03/06/20   Hoy Register, MD  venlafaxine XR (EFFEXOR XR) 37.5 MG 24 hr capsule Take 1 capsule (37.5 mg total) by mouth daily with breakfast. For hot flashes 10/05/21   Hoy Register, MD  vitamin B-12 (CYANOCOBALAMIN) 1000 MCG tablet Take 1 tablet (1,000 mcg total) by mouth daily. 07/28/20   Van Clines, MD  metFORMIN (GLUCOPHAGE) 500 MG tablet Take 0.5 tablets (250 mg total) by mouth daily with breakfast. 03/05/20 04/02/20  Anders Simmonds, PA-C    Current Facility-Administered Medications  Medication Dose Route Frequency Provider Last Rate Last Admin   allopurinol (ZYLOPRIM) tablet 100 mg  100 mg Oral Daily Dow Adolph N, DO   100 mg at 07/09/22 0858   enoxaparin (LOVENOX) injection 40 mg  40 mg Subcutaneous Q24H Dow Adolph N, DO   40 mg at 07/09/22 1610   folic acid (FOLVITE) tablet 1 mg  1 mg Oral Daily Charlynne Pander, MD   1 mg at 07/09/22 0858    insulin aspart (novoLOG) injection 0-5 Units  0-5 Units Subcutaneous QHS Rai, Ripudeep K, MD       insulin aspart (novoLOG) injection 0-9 Units  0-9 Units Subcutaneous TID WC Rai, Ripudeep K, MD       levETIRAcetam (KEPPRA) tablet 500 mg  500 mg Oral BID Dow Adolph N, DO   500 mg at 07/09/22 0857   lidocaine (LIDODERM) 5 % 1 patch  1 patch Transdermal Q24H Dow Adolph N, DO   1 patch at 07/09/22 9604   LORazepam (ATIVAN) injection 0-4 mg  0-4 mg Intravenous Q4H Charlynne Pander, MD       Followed by   LORazepam (ATIVAN) injection 0-4 mg  0-4 mg Intravenous Q8H Charlynne Pander, MD       LORazepam (ATIVAN) tablet 1-4 mg  1-4 mg Oral Q1H PRN  Charlynne Pander, MD       Or   LORazepam (ATIVAN) injection 1-4 mg  1-4 mg Intravenous Q1H PRN Charlynne Pander, MD       multivitamin with minerals tablet 1 tablet  1 tablet Oral Daily Charlynne Pander, MD   1 tablet at 07/09/22 0858   pantoprazole (PROTONIX) EC tablet 40 mg  40 mg Oral BID AC Hall, Carole N, DO   40 mg at 07/09/22 4098   sodium bicarbonate 150 mEq in sterile water 1,150 mL infusion   Intravenous Continuous Rai, Ripudeep K, MD 100 mL/hr at 07/09/22 0411 New Bag at 07/09/22 0411   thiamine (VITAMIN B1) tablet 100 mg  100 mg Oral Daily Charlynne Pander, MD   100 mg at 07/09/22 1191   Or   thiamine (VITAMIN B1) injection 100 mg  100 mg Intravenous Daily Charlynne Pander, MD       venlafaxine XR (EFFEXOR-XR) 24 hr capsule 37.5 mg  37.5 mg Oral Q breakfast Dow Adolph N, DO   37.5 mg at 07/09/22 4782    Allergies as of 07/07/2022 - Review Complete 07/07/2022  Allergen Reaction Noted   Losartan potassium  12/20/2012   Levaquin [levofloxacin in d5w] Rash 02/06/2013   Ace inhibitors Cough 01/30/2008   Codeine Nausea Only 05/26/2015   Cefepime Rash 09/05/2012   Vancomycin Rash 09/05/2012    Family History  Problem Relation Age of Onset   CAD Mother     Social History   Socioeconomic History   Marital status: Single     Spouse name: Not on file   Number of children: Not on file   Years of education: Not on file   Highest education level: Not on file  Occupational History   Not on file  Tobacco Use   Smoking status: Former    Types: Cigarettes    Quit date: 06/25/2012    Years since quitting: 10.0   Smokeless tobacco: Never  Vaping Use   Vaping Use: Never used  Substance and Sexual Activity   Alcohol use: Not Currently    Alcohol/week: 49.0 standard drinks of alcohol    Types: 14 Cans of beer, 35 Shots of liquor per week   Drug use: No   Sexual activity: Not Currently  Other Topics Concern   Not on file  Social History Narrative   Pt lives in single story home with her partner   Has 1 son   5 grandchildren   10th grade education   Works at [employer].    Right handed   Social Determinants of Health   Financial Resource Strain: Not on file  Food Insecurity: No Food Insecurity (06/25/2022)   Hunger Vital Sign    Worried About Running Out of Food in the Last Year: Never true    Ran Out of Food in the Last Year: Never true  Transportation Needs: No Transportation Needs (06/25/2022)   PRAPARE - Administrator, Civil Service (Medical): No    Lack of Transportation (Non-Medical): No  Physical Activity: Not on file  Stress: Not on file  Social Connections: Not on file  Intimate Partner Violence: Not At Risk (06/25/2022)   Humiliation, Afraid, Rape, and Kick questionnaire    Fear of Current or Ex-Partner: No    Emotionally Abused: No    Physically Abused: No    Sexually Abused: No    Review of Systems: ROS is O/W negative except as mentioned in HPI.  Physical Exam: Vital signs in last 24 hours: Temp:  [97.7 F (36.5 C)-98.5 F (36.9 C)] 98.1 F (36.7 C) (05/17 1118) Pulse Rate:  [83-105] 90 (05/17 1118) Resp:  [15-16] 16 (05/17 1118) BP: (99-149)/(60-88) 115/79 (05/17 1118) SpO2:  [91 %-97 %] 97 % (05/17 1118) Last BM Date : 07/08/22 General:  Alert, Well-developed,  well-nourished, pleasant and cooperative in NAD Head:  Normocephalic and atraumatic. Eyes:  Sclera clear, no icterus.  Conjunctiva pink. Ears:  Normal auditory acuity. Mouth:  No deformity or lesions.   Lungs:  Clear throughout to auscultation.  No wheezes, crackles, or rhonchi.  Heart:  Regular rate and rhythm; no murmurs, clicks, rubs, or gallops. Abdomen:  Soft, non-distended.  BS present.  Non-tender.   Msk:  Symmetrical without gross deformities.  Pulses:  Normal pulses noted. Extremities:  Without clubbing or edema. Neurologic:  Alert and oriented x 4;  grossly normal neurologically. Skin:  Intact without significant lesions or rashes. Psych:  Alert and cooperative. Normal mood and affect.  Intake/Output from previous day: 05/16 0701 - 05/17 0700 In: 1293.9 [P.O.:120; I.V.:1173.9] Out: -   Lab Results: Recent Labs    07/07/22 2123 07/08/22 0843  WBC 5.8 8.5  HGB 11.1* 10.1*  HCT 35.5* 30.9*  PLT 227 73*   BMET Recent Labs    07/07/22 2123 07/08/22 0843 07/09/22 0921  NA 136 135 135  K 4.1 4.2 3.8  CL 103 105 97*  CO2 18* 17* 28  GLUCOSE 331* 245* 151*  BUN 10 8 8   CREATININE 1.26* 1.10* 1.14*  CALCIUM 8.0* 7.9* 8.7*   LFT Recent Labs    07/08/22 0843 07/09/22 0921  PROT 5.5*  5.4* 6.2*  ALBUMIN 2.2*  2.2* 2.5*  AST 2,075*  2,067* >10,000*  ALT 239*  242* 1,725*  ALKPHOS 154*  155* 184*  BILITOT 1.3*  1.3* 2.9*  BILIDIR 0.9*  --   IBILI 0.4  --     Hepatitis Panel Recent Labs    07/08/22 0843  HEPBSAG NON REACTIVE  HCVAB NON REACTIVE  HEPAIGM NON REACTIVE  HEPBIGM NON REACTIVE    Studies/Results: US Abdomen Limited RUQ (LIVER/GB)  Addendum Date: 07/08/2022   ADDENDUM REPORT: 07/08/2022 16:46 ADDENDUM: Right kidney may be slightly echogenic, correlate with appropriate laboratory values. Electronically Signed   By: Jasmine Pang M.D.   On: 07/08/2022 16:46   Result Date: 07/08/2022 CLINICAL DATA:  Transaminitis EXAM: ULTRASOUND  ABDOMEN LIMITED RIGHT UPPER QUADRANT COMPARISON:  CT 05/29/2022 FINDINGS: Gallbladder: No shadowing stones. Diffuse gallbladder wall thickening measuring up to 7.8 mm. Negative sonographic Murphy. Common bile duct: Diameter: 4.3 mm Liver: Echogenic liver parenchyma. Question subtle surface nodularity. No focal hepatic abnormality. Portal vein is patent on color Doppler imaging with normal direction of blood flow towards the liver. Other: Trace perihepatic ascites. IMPRESSION: 1. Echogenic liver parenchyma consistent with steatosis. Question subtle surface nodularity as may be seen with cirrhosis. 2. Diffuse nonspecific gallbladder wall thickening without sonographic Murphy. 3. Trace perihepatic ascites. Electronically Signed: By: Jasmine Pang M.D. On: 07/08/2022 16:43   DG Knee Complete 4 Views Right  Result Date: 07/07/2022 CLINICAL DATA:  Right knee pain. EXAM: RIGHT KNEE - COMPLETE 4+ VIEW COMPARISON:  AP Lat 02/11/2022. FINDINGS: There is normal bone mineralization without evidence of fracture, dislocation or joint effusions. 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 There is moderate joint space loss and osteophytosis involving the medial femorotibial compartment and mild narrowing and spurring of the patellofemoral compartment. Spurring change  again noted of the tibial spines. The lateral compartment space is maintained. Findings consistent with nonerosive degenerative arthrosis. There are mild enthesopathic changes of the anterior patella. Soft tissues are unremarkable. Comparison to the prior study reveals no significant interval change. IMPRESSION: Nonerosive degenerative changes without evidence of fractures. Bipartite patellar configuration. No significant interval change. Electronically Signed   By: Almira Bar M.D.   On: 07/07/2022 22:28    IMPRESSION:  *New transaminitis: LFTs 2 days ago were completely normal. Now AST is greater than 10,000, ALT 1725, alk phos 185, total bili 2.9.   These are up significantly from yesterday.  Viral hepatitis studies negative.  Ultrasound ok.  Drinks alcohol, but denies any other drug use.  Only takes the medications in her bag that she is prescribed.  Does admit to taking 6 Tylenol on Tuesday which was followed by 2 beers and 4 shots of liquor.  Mentation is completely normal at this time.  PLAN: -Will check a PT/INR. -Will check a Tylenol level although might be past her window for that. -Will check EBV and CMV viral studies for acute infection. -Trend LFTs, PT/INR, and monitor neurostatus. -Will add N-acetylcysteine.  Princella Pellegrini. Zehr  07/09/2022, 1:29 PM  I have taken an interval history, thoroughly reviewed the chart and examined the patient. I agree with the Advanced Practitioner's note, impression and recommendations, and have recorded additional findings, impressions and recommendations below. I performed a substantive portion of this encounter (>50% time spent), including a complete performance of the medical decision making.  My additional thoughts are as follows:  Marked AST predominant transaminitis worsening since admission.  Bilirubin rising.  No INR checked on admission or today, so we will get those along with an infectious workup and a Tylenol level.  The overall clinical picture favors a medication/toxic effect from Tylenol potentiated by heavy alcohol use.  It is difficult to be certain from her report, but she also may have been taking the Tylenol and greater amounts than recommended.  If there is any ongoing NSAID use, that is contributing as well.  Lab testing as noted above, and we are also starting NAC.   As an aside, the echogenic liver parenchyma on ultrasound is consistent with alcohol abuse and the gallbladder wall thickening with trace perihepatic ascites is a result of the acute hepatitis.  We will follow  Charlie Pitter III Office:[phone]

## 2022-07-09 NOTE — Evaluation (Signed)
Occupational Therapy Evaluation Patient Details Name: Christina Byrd MRN: [id number] DOB: 06/11/1965 Today's Date: 07/09/2022   History of Present Illness The pt is a 57 yo female presenting with fatigue, reports she was sent from UC for anemia. Work up revealed CIWA of 9, admitted for alcohol withdrawal. PMH includes: HTN, seizure, GI bleed, CKD III, anemia, gout, R knee arthritis, and alcohol abuse.   Clinical Impression   Prior to this admission, patient independent, living with her husband, and owning a junkyard haul away business. Patient reports she feels back to her baseline, with patient ambulating to bathroom without assist, able to complete toileting and self care at sink. Patient already dressed upon OT arrival, able to demonstrate lower body dressing without issue. Handed off to PT to complete mobility assessment at end of session. OT will sign off, with patient with no further acute needs, and no need for OT follow up. Please re-consult if further acute needs arise.     Recommendations for follow up therapy are one component of a multi-disciplinary discharge planning process, led by the attending physician.  Recommendations may be updated based on patient status, additional functional criteria and insurance authorization.   Assistance Recommended at Discharge PRN  Patient can return home with the following Assist for transportation (initially)    Functional Status Assessment  Patient has had a recent decline in their functional status and demonstrates the ability to make significant improvements in function in a reasonable and predictable amount of time.  Equipment Recommendations  None recommended by OT    Recommendations for Other Services       Precautions / Restrictions Precautions Precautions: Fall Restrictions Weight Bearing Restrictions: No      Mobility Bed Mobility Overal bed mobility: Independent                  Transfers Overall transfer  level: Modified independent Equipment used: None               General transfer comment: supervision in session for lines      Balance Overall balance assessment: Mild deficits observed, not formally tested                                         ADL either performed or assessed with clinical judgement   ADL Overall ADL's : At baseline                                       General ADL Comments: Patient ambulating to bathroom without assist, able to complete toileting and self care at sink. Already dressed upon OT arrival, able to demonstrate lower body dressing without issue. Handed off to PT to complete mobility assessment at end of session.     Vision Baseline Vision/History: 0 No visual deficits Ability to See in Adequate Light: 0 Adequate Patient Visual Report: No change from baseline Vision Assessment?: No apparent visual deficits     Perception     Praxis      Pertinent Vitals/Pain Pain Assessment Pain Assessment: No/denies pain     Hand Dominance Right   Extremity/Trunk Assessment Upper Extremity Assessment Upper Extremity Assessment: Overall WFL for tasks assessed (minimal weakness but bilateral)   Lower Extremity Assessment Lower Extremity Assessment: Defer to PT evaluation   Cervical / Trunk Assessment  Cervical / Trunk Assessment: Normal   Communication Communication Communication: No difficulties   Cognition Arousal/Alertness: Awake/alert Behavior During Therapy: WFL for tasks assessed/performed Overall Cognitive Status: Within Functional Limits for tasks assessed                                       General Comments  VSS on RA, pt reports improvements in R knee pain    Exercises     Shoulder Instructions      Home Living Family/patient expects to be discharged to:: Private residence Living Arrangements: Spouse/significant other Available Help at Discharge: Family;Available 24  hours/day Type of Home: House Home Access: Stairs to enter [employer] of Steps: 3 Entrance Stairs-Rails: Right;Left;Can reach both Home Layout: One level     Bathroom Shower/Tub: Chief Strategy Officer: Standard     Home Equipment: Agricultural consultant (2 wheels);Crutches;Shower seat;Wheelchair - manual   Additional Comments: Has access to a lot of DME as they clean out SNF and get leftover DME patients do not take      Prior Functioning/Environment Prior Level of Function : Independent/Modified Independent;Working/employed;Driving             Mobility Comments: independent with all mobility, employed and drives, denies any recent falls ADLs Comments: independent, 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)        OT Problem List: Impaired balance (sitting and/or standing)      OT Treatment/Interventions:      OT Goals(Current goals can be found in the care plan section) Acute Rehab OT Goals Patient Stated Goal: to get back to my dogs and my buisiness OT Goal Formulation: With patient Time For Goal Achievement: 07/23/22 Potential to Achieve Goals: Good  OT Frequency:      Co-evaluation              AM-PAC OT "6 Clicks" Daily Activity     Outcome Measure Help from another person eating meals?: None Help from another person taking care of personal grooming?: None Help from another person toileting, which includes using toliet, bedpan, or urinal?: None Help from another person bathing (including washing, rinsing, drying)?: None Help from another person to put on and taking off regular upper body clothing?: None Help from another person to put on and taking off regular lower body clothing?: None 6 Click Score: 24   End of Session Equipment Utilized During Treatment: Gait belt Nurse Communication: Mobility status;Other (comment) (OT signing off)  Activity Tolerance: Patient tolerated treatment well Patient left: in  chair (handed off to PT)  OT Visit Diagnosis: Unsteadiness on feet (R26.81)                Time: 1610-9604 OT Time Calculation (min): 12 min Charges:  OT General Charges $OT Visit: 1 Visit OT Evaluation $OT Eval Moderate Complexity: 1 Mod  Pollyann Glen E. Callista Hoh, OTR/L Acute Rehabilitation Services [phone]   Cherlyn Cushing 07/09/2022, 11:49 AM

## 2022-07-10 DIAGNOSIS — R7989 Other specified abnormal findings of blood chemistry: Secondary | ICD-10-CM | POA: Diagnosis not present

## 2022-07-10 DIAGNOSIS — R7401 Elevation of levels of liver transaminase levels: Secondary | ICD-10-CM | POA: Diagnosis not present

## 2022-07-10 DIAGNOSIS — M25561 Pain in right knee: Secondary | ICD-10-CM | POA: Diagnosis not present

## 2022-07-10 DIAGNOSIS — S36118A Other injury of liver, initial encounter: Secondary | ICD-10-CM | POA: Diagnosis not present

## 2022-07-10 LAB — COMPREHENSIVE METABOLIC PANEL
ALT: 1126 U/L — ABNORMAL HIGH (ref 0–44)
AST: 6064 U/L — ABNORMAL HIGH (ref 15–41)
Albumin: 2.2 g/dL — ABNORMAL LOW (ref 3.5–5.0)
Alkaline Phosphatase: 154 U/L — ABNORMAL HIGH (ref 38–126)
Anion gap: 10 (ref 5–15)
BUN: <5 mg/dL — ABNORMAL LOW (ref 6–20)
CO2: 28 mmol/L (ref 22–32)
Calcium: 8.6 mg/dL — ABNORMAL LOW (ref 8.9–10.3)
Chloride: 98 mmol/L (ref 98–111)
Creatinine, Ser: 0.9 mg/dL (ref 0.44–1.00)
GFR, Estimated: >60 mL/min (ref >60)
Glucose, Bld: 108 mg/dL — ABNORMAL HIGH (ref 70–99)
Potassium: 3 mmol/L — ABNORMAL LOW (ref 3.5–5.1)
Sodium: 136 mmol/L (ref 135–145)
Total Bilirubin: 3.3 mg/dL — ABNORMAL HIGH (ref 0.3–1.2)
Total Protein: 5.6 g/dL — ABNORMAL LOW (ref 6.5–8.1)

## 2022-07-10 LAB — GLUCOSE, CAPILLARY
Glucose-Capillary: 124 mg/dL — ABNORMAL HIGH (ref 70–99)
Glucose-Capillary: 125 mg/dL — ABNORMAL HIGH (ref 70–99)
Glucose-Capillary: 175 mg/dL — ABNORMAL HIGH (ref 70–99)

## 2022-07-10 LAB — EPSTEIN-BARR VIRUS VCA, IGM: EBV VCA IgM: <36 U/mL (ref 0.0–35.9)

## 2022-07-10 LAB — CMV IGM: CMV IgM: <30 AU/mL (ref 0.0–29.9)

## 2022-07-10 MED ORDER — POTASSIUM CHLORIDE CRYS ER 20 MEQ PO TBCR
40.0000 meq | EXTENDED_RELEASE_TABLET | Freq: Once | ORAL | Status: AC
  Administered 2022-07-10: 40 meq via ORAL
  Filled 2022-07-10: qty 2

## 2022-07-10 NOTE — Progress Notes (Signed)
[facility]                                                                              Christina Byrd, is a 57 y.o. female, DOB - [birth year], WUJ:[id number] Admit date - 07/07/2022    Outpatient Primary MD for the patient is Pcp, No  LOS - 3  days  Chief Complaint  Patient presents with   Fatigue       Brief summary   Patient is a 57 year old female with history of alcohol abuse, seizure disorder, prior history of GI bleed, CKD stage IIIa, anemia of chronic disease, iron deficiency anemia HLP, GERD, history of gout and osteoarthritis right knee, follows orthopedics presented to ED with acute right knee pain.  Patient feels it is like a gout flare, also reported fatigue and tremors. Last alcohol intake was night before the admission, drank 2 beers and 4 shots of hard liquor. In ED, x-ray of the right knee showed nonerosive degenerative changes without evidence of fracture, bipartite patellar configuration.   Assessment & Plan    Principal Problem:   Acute pain of right knee -Patient has history of gout and right knee tricompartmental arthritis, follows orthopedics. -s/p arthrocentesis and cortisone shot by orthopedics on 5/16. -Feels better with right knee pain   Active Problems: Acute transaminitis, unclear etiology -On admission, LFTs normal, however has been trending up since admission.   -AST 41-> 2075-> >10,000-> 6064 ALT 14-> 239-> 1725-> 1126 Alk phos 89-> 154-> 184-> 154 -Total bili 0.3-> 1.3-> 2.9-> 3.3 -INR 2.2 -CK normal -Acute hepatitis panel negative -CRP 0.7, ESR 19 -Right upper quadrant ultrasound showed steatosis,?  Cirrhosis, diffuse nonspecific gallbladder wall thickening without Murphy sign, trace perihepatic ascites -Discontinued venlafaxine due to s/e of hepatotoxicity -EBV pending, CMV negative, UDS+ BZD, volatile standing -Likely due to combination of Tylenol toxicity and heavy alcohol abuse.  Appreciate GI for seeing the  patient, started on N-acetylcysteine, will complete course  History of alcohol use with risk of acute alcohol withdrawals -continue CIWA with Ativan -Continue thiamine, folate -Currently stable, not in any acute withdrawals  Mild AKI on CKD stage IIIa, NAGMA -Creatinine 1.26 on admission, was 0.9 on 06/29/2022 -Creatinine improved, on gentle hydration    Seizure (HCC) -Continue Keppra    Type 2 diabetes mellitus (HCC), uncontrolled with hyperglycemia -Placed on sliding scale insulin while inpatient, obtain HbA1c -Hemoglobin A1c 4.6 on 04/27/2022    Iron deficiency anemia H&H currently at baseline 10-11   Estimated body mass index is 22.86 kg/m as calculated from the following:   Height as of 06/25/22: 5\' 2"  (1.575 m).   Weight as of this encounter: 56.7 kg.  Code Status: full code  DVT Prophylaxis:  enoxaparin (LOVENOX) injection 40 mg Start: 07/08/22 1000   Level of Care: Level of care: Progressive Family Communication: Updated patient's family members at the bedside on 5/17 Disposition Plan:      Remains inpatient appropriate:     Procedures:   Consultants:   Orthopedics  Gastroenterology  Antimicrobials: none   Medications  allopurinol  100 mg Oral Daily   enoxaparin (LOVENOX) injection  40 mg Subcutaneous Q24H  folic acid  1 mg Oral Daily   insulin aspart  0-5 Units Subcutaneous QHS   insulin aspart  0-9 Units Subcutaneous TID WC   levETIRAcetam  500 mg Oral BID   lidocaine  1 patch Transdermal Q24H   LORazepam  0-4 mg Intravenous Q8H   multivitamin with minerals  1 tablet Oral Daily   pantoprazole  40 mg Oral BID AC   thiamine  100 mg Oral Daily   Or   thiamine  100 mg Intravenous Daily      Subjective:   Christina Byrd was seen and examined today.  No acute complaints this morning, right knee pain controlled.  LFTs improving.  Did not sleep well last night.  Objective:   Vitals:   07/10/22 0730 07/10/22 0800 07/10/22 0817 07/10/22 1140  BP:    138/86 134/83  Pulse: 85 (!) 105 84 99  Resp:      Temp:   98.4 F (36.9 C) 98.2 F (36.8 C)  TempSrc:   Oral Oral  SpO2: 95% 91% 94% 100%  Weight:        Intake/Output Summary (Last 24 hours) at 07/10/2022 1309 Last data filed at 07/10/2022 0347 Gross per 24 hour  Intake 1310.81 ml  Output --  Net 1310.81 ml     Wt Readings from Last 3 Encounters:  07/07/22 56.7 kg  06/25/22 56.8 kg  06/03/22 56.8 kg    Physical Exam General: Alert and oriented x 3, NAD Cardiovascular: S1 S2 clear, RRR.  Respiratory: CTAB, no wheezing Gastrointestinal: Soft, nontender, nondistended, NBS Ext: no pedal edema bilaterally Neuro: no new deficits Psych: Normal affect   Data Reviewed:  I have personally reviewed following labs    CBC Lab Results  Component Value Date   WBC 9.4 07/09/2022   RBC 3.55 (L) 07/09/2022   HGB 10.1 (L) 07/09/2022   HCT 30.7 (L) 07/09/2022   MCV 86.5 07/09/2022   MCH 28.5 07/09/2022   PLT 77 (L) 07/09/2022   MCHC 32.9 07/09/2022   RDW 16.3 (H) 07/09/2022   LYMPHSABS 1.4 07/07/2022   MONOABS 0.6 07/07/2022   EOSABS 0.0 07/07/2022   BASOSABS 0.0 07/07/2022     Last metabolic panel Lab Results  Component Value Date   NA 136 07/10/2022   K 3.0 (L) 07/10/2022   CL 98 07/10/2022   CO2 28 07/10/2022   BUN <5 (L) 07/10/2022   CREATININE 0.90 07/10/2022   GLUCOSE 108 (H) 07/10/2022   GFRNONAA >60 07/10/2022   GFRAA 61 01/02/2020   CALCIUM 8.6 (L) 07/10/2022   PHOS 2.9 06/29/2022   PROT 5.6 (L) 07/10/2022   ALBUMIN 2.2 (L) 07/10/2022   LABGLOB 3.6 04/27/2022   AGRATIO 1.0 (L) 04/27/2022   BILITOT 3.3 (H) 07/10/2022   ALKPHOS 154 (H) 07/10/2022   AST 6,064 (H) 07/10/2022   ALT 1,126 (H) 07/10/2022   ANIONGAP 10 07/10/2022    CBG (last 3)  Recent Labs    07/09/22 2130 07/10/22 0814 07/10/22 1139  GLUCAP 158* 124* 125*      Coagulation Profile: Recent Labs  Lab 07/09/22 1600  INR 2.2*     Radiology Studies: I have personally  reviewed the imaging studies  US Abdomen Limited RUQ (LIVER/GB)  Addendum Date: 07/08/2022   ADDENDUM REPORT: 07/08/2022 16:46 ADDENDUM: Right kidney may be slightly echogenic, correlate with appropriate laboratory values. Electronically Signed   By: Jasmine Pang M.D.   On: 07/08/2022 16:46   Result Date: 07/08/2022 CLINICAL DATA:  Transaminitis  EXAM: ULTRASOUND ABDOMEN LIMITED RIGHT UPPER QUADRANT COMPARISON:  CT 05/29/2022 FINDINGS: Gallbladder: No shadowing stones. Diffuse gallbladder wall thickening measuring up to 7.8 mm. Negative sonographic Murphy. Common bile duct: Diameter: 4.3 mm Liver: Echogenic liver parenchyma. Question subtle surface nodularity. No focal hepatic abnormality. Portal vein is patent on color Doppler imaging with normal direction of blood flow towards the liver. Other: Trace perihepatic ascites. IMPRESSION: 1. Echogenic liver parenchyma consistent with steatosis. Question subtle surface nodularity as may be seen with cirrhosis. 2. Diffuse nonspecific gallbladder wall thickening without sonographic Murphy. 3. Trace perihepatic ascites. Electronically Signed: By: Jasmine Pang M.D. On: 07/08/2022 16:43       Gwendy Boeder M.D. [facility] 07/10/2022, 1:09 PM  Available via Epic secure chat 7am-7pm After 7 pm, please refer to night coverage provider listed on amion.

## 2022-07-10 NOTE — Progress Notes (Signed)
[facility] Progress Note  Chief Complaint: Acute liver injury, suspected drug-induced.  History:  She had no complaints today other than that she did not sleep well last night and her breakfast tray has not arrived yet. No clinical events overnight recorded.  No vomiting, no overt GI bleeding.  Denies chest pain dyspnea or dysuria   Objective:   Current Facility-Administered Medications:    [COMPLETED] acetylcysteine (ACETADOTE) 30.5 mg/mL load via infusion 8,505 mg, 150 mg/kg, Intravenous, Once, 8,505 mg at 07/09/22 1547 **FOLLOWED BY** [EXPIRED] acetylcysteine (ACETADOTE) 18,000 mg in dextrose 5 % 590 mL (30.5085 mg/mL) infusion, 12.5 mg/kg/hr, Intravenous, Continuous, Stopped at 07/09/22 2024 **FOLLOWED BY** acetylcysteine (ACETADOTE) 18,000 mg in dextrose 5 % 590 mL (30.5085 mg/mL) infusion, 6.25 mg/kg/hr, Intravenous, Continuous, McMichael, Bayley M, PA-C, Last Rate: 11.62 mL/hr at 07/10/22 0347, 6.25 mg/kg/hr at 07/10/22 0347   allopurinol (ZYLOPRIM) tablet 100 mg, 100 mg, Oral, Daily, Hall, Carole N, DO, 100 mg at 07/10/22 1008   enoxaparin (LOVENOX) injection 40 mg, 40 mg, Subcutaneous, Q24H, Hall, Carole N, DO, 40 mg at 07/10/22 1010   folic acid (FOLVITE) tablet 1 mg, 1 mg, Oral, Daily, Charlynne Pander, MD, 1 mg at 07/10/22 1008   hydrocortisone cream 1 %, , Topical, PRN, Rai, Ripudeep K, MD   insulin aspart (novoLOG) injection 0-5 Units, 0-5 Units, Subcutaneous, QHS, Rai, Ripudeep K, MD   insulin aspart (novoLOG) injection 0-9 Units, 0-9 Units, Subcutaneous, TID WC, Rai, Ripudeep K, MD   lactated ringers infusion, , Intravenous, Continuous, Rai, Ripudeep K, MD, Last Rate: 100 mL/hr at 07/10/22 0347, Infusion Verify at 07/10/22 0347   levETIRAcetam (KEPPRA) tablet 500 mg, 500 mg, Oral, BID, Hall, Carole N, DO, 500 mg at 07/10/22 1008   lidocaine (LIDODERM) 5 % 1 patch, 1 patch, Transdermal, Q24H, Hall, Carole N, DO, 1 patch at 07/10/22 0650   [EXPIRED] LORazepam (ATIVAN)  injection 0-4 mg, 0-4 mg, Intravenous, Q4H **FOLLOWED BY** LORazepam (ATIVAN) injection 0-4 mg, 0-4 mg, Intravenous, Q8H, Charlynne Pander, MD   LORazepam (ATIVAN) tablet 1-4 mg, 1-4 mg, Oral, Q1H PRN **OR** LORazepam (ATIVAN) injection 1-4 mg, 1-4 mg, Intravenous, Q1H PRN, Charlynne Pander, MD, 2 mg at 07/09/22 1812   multivitamin with minerals tablet 1 tablet, 1 tablet, Oral, Daily, Charlynne Pander, MD, 1 tablet at 07/10/22 1008   ondansetron (ZOFRAN) injection 4 mg, 4 mg, Intravenous, Q6H PRN, Rai, Ripudeep K, MD   pantoprazole (PROTONIX) EC tablet 40 mg, 40 mg, Oral, BID AC, Hall, Carole N, DO, 40 mg at 07/10/22 1008   thiamine (VITAMIN B1) tablet 100 mg, 100 mg, Oral, Daily, 100 mg at 07/10/22 1008 **OR** thiamine (VITAMIN B1) injection 100 mg, 100 mg, Intravenous, Daily, Charlynne Pander, MD   acetylcysteine 6.25 mg/kg/hr (07/10/22 0347)   lactated ringers 100 mL/hr at 07/10/22 0347     Vital signs in last 24 hrs: Vitals:   07/10/22 0800 07/10/22 0817  BP:  138/86  Pulse: (!) 105 84  Resp:    Temp:  98.4 F (36.9 C)  SpO2: 91% 94%    Intake/Output Summary (Last 24 hours) at 07/10/2022 1111 Last data filed at 07/10/2022 0347 Gross per 24 hour  Intake 1310.81 ml  Output --  Net 1310.81 ml     Physical Exam  HEENT: sclera mildly icteric, oropharynx clear Neck: supple, no thyromegaly, JVD or lymphadenopathy Cardiac: RRR without murmurs, S1S2 heard, no peripheral edema Pulm: clear to auscultation bilaterally, normal RR and effort noted Abdomen: soft, no  tenderness, with active bowel sounds. No guarding or palpable hepatosplenomegaly Skin; warm and dry, no jaundice Neuro: Alert and conversational, no focal gross motor deficits, follows commands well. Recent Labs:     Latest Ref Rng & Units 07/09/2022    1:22 PM 07/08/2022    8:43 AM 07/07/2022    9:23 PM  CBC  WBC 4.0 - 10.5 K/uL 9.4  8.5  5.8   Hemoglobin 12.0 - 15.0 g/dL 16.1  09.6  04.5   Hematocrit 36.0 -  46.0 % 30.7  30.9  35.5   Platelets 150 - 400 K/uL 77  73  227     Recent Labs  Lab 07/09/22 1600  INR 2.2*      Latest Ref Rng & Units 07/10/2022    4:03 AM 07/09/2022    9:21 AM 07/08/2022    8:43 AM  CMP  Glucose 70 - 99 mg/dL 409  811  914   BUN 6 - 20 mg/dL 5  8  8    Creatinine 0.44 - 1.00 mg/dL 7.82  9.56  2.13   Sodium 135 - 145 mmol/L 136  135  135   Potassium 3.5 - 5.1 mmol/L 3.0  3.8  4.2   Chloride 98 - 111 mmol/L 98  97  105   CO2 22 - 32 mmol/L 28  28  17    Calcium 8.9 - 10.3 mg/dL 8.6  8.7  7.9   Total Protein 6.5 - 8.1 g/dL 5.6  6.2  5.4    5.5   Total Bilirubin 0.3 - 1.2 mg/dL 3.3  2.9  1.3    1.3   Alkaline Phos 38 - 126 U/L 154  184  155    154   AST 15 - 41 U/L 6,064  >10,000  2,067    2,075   ALT 0 - 44 U/L 1,126  1,725  242    239    Acetaminophen level 07/09/2022 at 4 PM undetected  Radiologic studies:   Assessment & Plan  Assessment: Elevated LFTs, primarily a marked AST predominant transaminitis with lesser degree of cholestasis.  I believe this is therapeutic misadventure with acetaminophen (possibly an greater than prescribed amount) in conjunction with excess alcohol use. Acetaminophen level was checked well after admission. Mild coagulopathy yesterday, no INR checked today.  No current signs GI bleeding or hepatic encephalopathy.  Transaminases peaked yesterday and are improved today, and there is a continued mild rise of the bilirubin as expected in this scenario.  Plan: Complete entire course of N acetylcysteine.  Regular diet  Continued hospital observation  Check CMP and INR tomorrow morning    Charlie Pitter III Office: [phone]

## 2022-07-11 DIAGNOSIS — R7989 Other specified abnormal findings of blood chemistry: Secondary | ICD-10-CM | POA: Diagnosis not present

## 2022-07-11 DIAGNOSIS — S36118A Other injury of liver, initial encounter: Secondary | ICD-10-CM | POA: Diagnosis not present

## 2022-07-11 DIAGNOSIS — M25561 Pain in right knee: Secondary | ICD-10-CM | POA: Diagnosis not present

## 2022-07-11 DIAGNOSIS — R569 Unspecified convulsions: Secondary | ICD-10-CM | POA: Diagnosis not present

## 2022-07-11 DIAGNOSIS — F10930 Alcohol use, unspecified with withdrawal, uncomplicated: Secondary | ICD-10-CM | POA: Diagnosis not present

## 2022-07-11 DIAGNOSIS — F10939 Alcohol use, unspecified with withdrawal, unspecified: Secondary | ICD-10-CM | POA: Diagnosis not present

## 2022-07-11 LAB — COMPREHENSIVE METABOLIC PANEL
ALT: 742 U/L — ABNORMAL HIGH (ref 0–44)
AST: 1803 U/L — ABNORMAL HIGH (ref 15–41)
Albumin: 2.2 g/dL — ABNORMAL LOW (ref 3.5–5.0)
Alkaline Phosphatase: 181 U/L — ABNORMAL HIGH (ref 38–126)
Anion gap: 10 (ref 5–15)
BUN: 7 mg/dL (ref 6–20)
CO2: 23 mmol/L (ref 22–32)
Calcium: 8.8 mg/dL — ABNORMAL LOW (ref 8.9–10.3)
Chloride: 102 mmol/L (ref 98–111)
Creatinine, Ser: 1.31 mg/dL — ABNORMAL HIGH (ref 0.44–1.00)
GFR, Estimated: 48 mL/min — ABNORMAL LOW (ref >60)
Glucose, Bld: 126 mg/dL — ABNORMAL HIGH (ref 70–99)
Potassium: 4 mmol/L (ref 3.5–5.1)
Sodium: 135 mmol/L (ref 135–145)
Total Bilirubin: 3.8 mg/dL — ABNORMAL HIGH (ref 0.3–1.2)
Total Protein: 5.7 g/dL — ABNORMAL LOW (ref 6.5–8.1)

## 2022-07-11 LAB — PROTIME-INR
INR: 1.4 — ABNORMAL HIGH (ref 0.8–1.2)
Prothrombin Time: 17.5 seconds — ABNORMAL HIGH (ref 11.4–15.2)

## 2022-07-11 LAB — CBC
HCT: 31.8 % — ABNORMAL LOW (ref 36.0–46.0)
Hemoglobin: 10.6 g/dL — ABNORMAL LOW (ref 12.0–15.0)
MCH: 28.8 pg (ref 26.0–34.0)
MCHC: 33.3 g/dL (ref 30.0–36.0)
MCV: 86.4 fL (ref 80.0–100.0)
Platelets: 101 10*3/uL — ABNORMAL LOW (ref 150–400)
RBC: 3.68 MIL/uL — ABNORMAL LOW (ref 3.87–5.11)
RDW: 15.8 % — ABNORMAL HIGH (ref 11.5–15.5)
WBC: 8.1 10*3/uL (ref 4.0–10.5)
nRBC: 0 % (ref 0.0–0.2)

## 2022-07-11 LAB — GLUCOSE, CAPILLARY
Glucose-Capillary: 111 mg/dL — ABNORMAL HIGH (ref 70–99)
Glucose-Capillary: 111 mg/dL — ABNORMAL HIGH (ref 70–99)
Glucose-Capillary: 133 mg/dL — ABNORMAL HIGH (ref 70–99)
Glucose-Capillary: 185 mg/dL — ABNORMAL HIGH (ref 70–99)

## 2022-07-11 LAB — HEMOGLOBIN A1C
Hgb A1c MFr Bld: 5 % (ref 4.8–5.6)
Mean Plasma Glucose: 96.8 mg/dL

## 2022-07-11 NOTE — Progress Notes (Signed)
[facility] Progress Note  Chief Complaint: Drug-induced liver injury  History:  She has no complaints today other than being hungry.  Apparently her knee pain has improved. Hoping to go home soon.  ROS: Cardiovascular: Denies chest pain Respiratory: Denies dyspnea Urinary: Denies dysuria  Objective:   Current Facility-Administered Medications:    [COMPLETED] acetylcysteine (ACETADOTE) 30.5 mg/mL load via infusion 8,505 mg, 150 mg/kg, Intravenous, Once, 8,505 mg at 07/09/22 1547 **FOLLOWED BY** [EXPIRED] acetylcysteine (ACETADOTE) 18,000 mg in dextrose 5 % 590 mL (30.5085 mg/mL) infusion, 12.5 mg/kg/hr, Intravenous, Continuous, Stopped at 07/09/22 2024 **FOLLOWED BY** acetylcysteine (ACETADOTE) 18,000 mg in dextrose 5 % 590 mL (30.5085 mg/mL) infusion, 6.25 mg/kg/hr, Intravenous, Continuous, McMichael, Bayley M, PA-C, Last Rate: 11.62 mL/hr at 07/11/22 0600, 6.25 mg/kg/hr at 07/11/22 0600   allopurinol (ZYLOPRIM) tablet 100 mg, 100 mg, Oral, Daily, Hall, Carole N, DO, 100 mg at 07/11/22 0846   enoxaparin (LOVENOX) injection 40 mg, 40 mg, Subcutaneous, Q24H, Hall, Carole N, DO, 40 mg at 07/11/22 0847   folic acid (FOLVITE) tablet 1 mg, 1 mg, Oral, Daily, Charlynne Pander, MD, 1 mg at 07/11/22 0847   hydrocortisone cream 1 %, , Topical, PRN, Rai, Ripudeep K, MD   insulin aspart (novoLOG) injection 0-5 Units, 0-5 Units, Subcutaneous, QHS, Rai, Ripudeep K, MD   insulin aspart (novoLOG) injection 0-9 Units, 0-9 Units, Subcutaneous, TID WC, Rai, Ripudeep K, MD   levETIRAcetam (KEPPRA) tablet 500 mg, 500 mg, Oral, BID, Hall, Carole N, DO, 500 mg at 07/11/22 0847   lidocaine (LIDODERM) 5 % 1 patch, 1 patch, Transdermal, Q24H, Hall, Carole N, DO, 1 patch at 07/11/22 0848   [EXPIRED] LORazepam (ATIVAN) injection 0-4 mg, 0-4 mg, Intravenous, Q4H **FOLLOWED BY** LORazepam (ATIVAN) injection 0-4 mg, 0-4 mg, Intravenous, Q8H, Charlynne Pander, MD, 1 mg at 07/10/22 2129   multivitamin with minerals  tablet 1 tablet, 1 tablet, Oral, Daily, Charlynne Pander, MD, 1 tablet at 07/11/22 0847   ondansetron (ZOFRAN) injection 4 mg, 4 mg, Intravenous, Q6H PRN, Rai, Ripudeep K, MD   pantoprazole (PROTONIX) EC tablet 40 mg, 40 mg, Oral, BID AC, Hall, Carole N, DO, 40 mg at 07/11/22 0847   thiamine (VITAMIN B1) tablet 100 mg, 100 mg, Oral, Daily, 100 mg at 07/11/22 0847 **OR** thiamine (VITAMIN B1) injection 100 mg, 100 mg, Intravenous, Daily, Charlynne Pander, MD   acetylcysteine 6.25 mg/kg/hr (07/11/22 0600)     Vital signs in last 24 hrs: Vitals:   07/11/22 0327 07/11/22 0730  BP: 133/86 (!) 141/86  Pulse: (!) 102 (!) 103  Resp: 16   Temp:    SpO2: 95% 96%    Intake/Output Summary (Last 24 hours) at 07/11/2022 0931 Last data filed at 07/11/2022 0600 Gross per 24 hour  Intake 1128.38 ml  Output --  Net 1128.38 ml     Physical Exam  HEENT: sclera mildly icteric, oral mucosa without lesions Neck: supple, no thyromegaly, JVD or lymphadenopathy Cardiac: RRR without murmurs, S1S2 heard, no peripheral edema Pulm: clear to auscultation bilaterally, normal RR and effort noted Abdomen: soft, no tenderness, with active bowel sounds. No guarding or palpable hepatosplenomegaly Skin; warm and dry, no appreciable jaundice  Recent Labs:     Latest Ref Rng & Units 07/11/2022    4:03 AM 07/09/2022    1:22 PM 07/08/2022    8:43 AM  CBC  WBC 4.0 - 10.5 K/uL 8.1  9.4  8.5   Hemoglobin 12.0 - 15.0 g/dL 16.1  09.6  10.1   Hematocrit 36.0 - 46.0 % 31.8  30.7  30.9   Platelets 150 - 400 K/uL 101  77  73     Recent Labs  Lab 07/11/22 0403  INR 1.4*      Latest Ref Rng & Units 07/11/2022    4:03 AM 07/10/2022    4:03 AM 07/09/2022    9:21 AM  CMP  Glucose 70 - 99 mg/dL 161  096  045   BUN 6 - 20 mg/dL 7  <5  8   Creatinine 4.09 - 1.00 mg/dL 8.11  9.14  7.82   Sodium 135 - 145 mmol/L 135  136  135   Potassium 3.5 - 5.1 mmol/L 4.0  3.0  3.8   Chloride 98 - 111 mmol/L 102  98  97   CO2  22 - 32 mmol/L 23  28  28    Calcium 8.9 - 10.3 mg/dL 8.8  8.6  8.7   Total Protein 6.5 - 8.1 g/dL 5.7  5.6  6.2   Total Bilirubin 0.3 - 1.2 mg/dL 3.8  3.3  2.9   Alkaline Phos 38 - 126 U/L 181  154  184   AST 15 - 41 U/L 1,803  6,064  >10,000   ALT 0 - 44 U/L 742  1,126  1,725      Radiologic studies:   Assessment & Plan  Assessment: Drug-induced liver injury-therapeutic misadventure with acetaminophen and excess use of alcohol.  Markedly elevated liver labs with AST predominant transaminitis and lesser degree of cholestasis.  Transaminases improving, alkaline phosphatase and total bilirubin still slowly rising, though typical in this scenario.  Cholestatic labs will peak later than the transaminases.  Most importantly, INR down from 2.2 to 1.4 today. No evidence GI bleeding, no clinical evidence of ascites on exam, no peripheral edema, no hepatic encephalopathy.  Tolerating regular diet without difficulty.  Current recommendation is to complete the entire course of an acetylcysteine and, if LFTs and INR still trending down at that point, she can be discharged home from a GI perspective for outpatient primary care follow-up and a recheck of these labs within a week.  Going forward, no Tylenol use and no NSAIDs (because she also recently had an upper GI ulcer from NSAID use)  Inpatient GI service signing off, call as needed.  Charlie Pitter III Office: [phone]

## 2022-07-11 NOTE — Progress Notes (Signed)
[facility]                                                                              Christina Byrd, is a 57 y.o. female, DOB - June 21, 1965, ZOX:[id number] Admit date - 07/07/2022    Outpatient Primary MD for the patient is Pcp, No  LOS - 4  days  Chief Complaint  Patient presents with   Fatigue       Brief summary   Patient is a 58 year old female with history of alcohol abuse, seizure disorder, prior history of GI bleed, CKD stage IIIa, anemia of chronic disease, iron deficiency anemia HLP, GERD, history of gout and osteoarthritis right knee, follows orthopedics presented to ED with acute right knee pain.  Patient feels it is like a gout flare, also reported fatigue and tremors. Last alcohol intake was night before the admission, drank 2 beers and 4 shots of hard liquor. In ED, x-ray of the right knee showed nonerosive degenerative changes without evidence of fracture, bipartite patellar configuration.   Assessment & Plan    Principal Problem:   Acute pain of right knee -Patient has history of gout and right knee tricompartmental arthritis, follows orthopedics. -s/p arthrocentesis and cortisone shot by orthopedics on 5/16. -Currently no acute issues with the right knee, feeling better   Active Problems: Acute transaminitis, unclear etiology -On admission, LFTs normal, however has been trending up since admission.   -AST 41-> 2075-> >10,000-> 6064-> 1803 ALT 14-> 239-> 1725-> 1126-> 742 Alk phos 89-> 154-> 184-> 154-> 181 -Total bili 0.3-> 1.3-> 2.9-> 3.3-> 3.8 -INR 2.2-> 1.4 -CK normal -Acute hepatitis panel negative -CRP 0.7, ESR 19 -Right upper quadrant ultrasound showed steatosis,?  Cirrhosis, diffuse nonspecific gallbladder wall thickening without Murphy sign, trace perihepatic ascites -Discontinued venlafaxine due to s/e of hepatotoxicity -EBV negative, CMV negative, UDS+ BZD, volatile standing -Likely due to combination of Tylenol toxicity  and heavy alcohol abuse.  -Appreciate GI recommendations, continue IV acetylcysteine to complete course.  History of alcohol use with risk of acute alcohol withdrawals -continue CIWA with Ativan -Continue thiamine, folate -Currently stable, not in any acute withdrawals  Mild AKI on CKD stage IIIa, NAGMA -Creatinine 1.26 on admission, was 0.9 on 06/29/2022 -Creatinine stable    Seizure (HCC) -Continue Keppra    Type 2 diabetes mellitus (HCC), uncontrolled with hyperglycemia -Placed on sliding scale insulin while inpatient, obtain HbA1c -Hemoglobin A1c 4.6 on 04/27/2022    Iron deficiency anemia H&H currently at baseline 10-11   Estimated body mass index is 22.86 kg/m as calculated from the following:   Height as of 06/25/22: 5\' 2"  (1.575 m).   Weight as of this encounter: 56.7 kg.  Code Status: full code  DVT Prophylaxis:  enoxaparin (LOVENOX) injection 40 mg Start: 07/08/22 1000   Level of Care: Level of care: Progressive Family Communication: Updated patien tDisposition Plan:      Remains inpatient appropriate:     Procedures:   Consultants:   Orthopedics  Gastroenterology  Antimicrobials: none   Medications  allopurinol  100 mg Oral Daily   enoxaparin (LOVENOX) injection  40 mg Subcutaneous Q24H   folic acid  1 mg Oral Daily   insulin aspart  0-5 Units Subcutaneous QHS   insulin aspart  0-9 Units Subcutaneous TID WC   levETIRAcetam  500 mg Oral BID   lidocaine  1 patch Transdermal Q24H   LORazepam  0-4 mg Intravenous Q8H   multivitamin with minerals  1 tablet Oral Daily   pantoprazole  40 mg Oral BID AC   thiamine  100 mg Oral Daily   Or   thiamine  100 mg Intravenous Daily      Subjective:   Christina Byrd was seen and examined today.  No acute complaints, resting comfortably.  Knee pain is controlled.   Objective:   Vitals:   07/10/22 2131 07/10/22 2330 07/11/22 0327 07/11/22 0730  BP:  121/76 133/86 (!) 141/86  Pulse: (!) 122 (!) 112 (!) 102 (!)  103  Resp:  16 16   Temp:  97.6 F (36.4 C)    TempSrc:  Oral    SpO2:  95% 95% 96%  Weight:        Intake/Output Summary (Last 24 hours) at 07/11/2022 1304 Last data filed at 07/11/2022 0600 Gross per 24 hour  Intake 1128.38 ml  Output --  Net 1128.38 ml     Wt Readings from Last 3 Encounters:  07/07/22 56.7 kg  06/25/22 56.8 kg  06/03/22 56.8 kg   Physical Exam General: Alert and oriented x 3, NAD Cardiovascular: S1 S2 clear, RRR.  Respiratory: CTAB, no wheezing Gastrointestinal: Soft, nontender, nondistended, NBS Ext: no pedal edema bilaterally Psych: Normal affect   Data Reviewed:  I have personally reviewed following labs    CBC Lab Results  Component Value Date   WBC 8.1 07/11/2022   RBC 3.68 (L) 07/11/2022   HGB 10.6 (L) 07/11/2022   HCT 31.8 (L) 07/11/2022   MCV 86.4 07/11/2022   MCH 28.8 07/11/2022   PLT 101 (L) 07/11/2022   MCHC 33.3 07/11/2022   RDW 15.8 (H) 07/11/2022   LYMPHSABS 1.4 07/07/2022   MONOABS 0.6 07/07/2022   EOSABS 0.0 07/07/2022   BASOSABS 0.0 07/07/2022     Last metabolic panel Lab Results  Component Value Date   NA 135 07/11/2022   K 4.0 07/11/2022   CL 102 07/11/2022   CO2 23 07/11/2022   BUN 7 07/11/2022   CREATININE 1.31 (H) 07/11/2022   GLUCOSE 126 (H) 07/11/2022   GFRNONAA 48 (L) 07/11/2022   GFRAA 61 01/02/2020   CALCIUM 8.8 (L) 07/11/2022   PHOS 2.9 06/29/2022   PROT 5.7 (L) 07/11/2022   ALBUMIN 2.2 (L) 07/11/2022   LABGLOB 3.6 04/27/2022   AGRATIO 1.0 (L) 04/27/2022   BILITOT 3.8 (H) 07/11/2022   ALKPHOS 181 (H) 07/11/2022   AST 1,803 (H) 07/11/2022   ALT 742 (H) 07/11/2022   ANIONGAP 10 07/11/2022    CBG (last 3)  Recent Labs    07/10/22 2103 07/11/22 0727 07/11/22 1114  GLUCAP 175* 111* 111*      Coagulation Profile: Recent Labs  Lab 07/09/22 1600 07/11/22 0403  INR 2.2* 1.4*     Radiology Studies: I have personally reviewed the imaging studies  No results found.     Thad Ranger  M.D. [facility] 07/11/2022, 1:04 PM  Available via Epic secure chat 7am-7pm After 7 pm, please refer to night coverage provider listed on amion.

## 2022-07-12 ENCOUNTER — Ambulatory Visit: Payer: Medicaid Other | Admitting: Internal Medicine

## 2022-07-12 DIAGNOSIS — F10930 Alcohol use, unspecified with withdrawal, uncomplicated: Secondary | ICD-10-CM | POA: Diagnosis not present

## 2022-07-12 DIAGNOSIS — M25561 Pain in right knee: Secondary | ICD-10-CM | POA: Diagnosis not present

## 2022-07-12 DIAGNOSIS — R569 Unspecified convulsions: Secondary | ICD-10-CM | POA: Diagnosis not present

## 2022-07-12 LAB — CBC
HCT: 29.5 % — ABNORMAL LOW (ref 36.0–46.0)
Hemoglobin: 9.9 g/dL — ABNORMAL LOW (ref 12.0–15.0)
MCH: 28.9 pg (ref 26.0–34.0)
MCHC: 33.6 g/dL (ref 30.0–36.0)
MCV: 86 fL (ref 80.0–100.0)
Platelets: 101 10*3/uL — ABNORMAL LOW (ref 150–400)
RBC: 3.43 MIL/uL — ABNORMAL LOW (ref 3.87–5.11)
RDW: 15.9 % — ABNORMAL HIGH (ref 11.5–15.5)
WBC: 6.2 10*3/uL (ref 4.0–10.5)
nRBC: 0 % (ref 0.0–0.2)

## 2022-07-12 LAB — COMPREHENSIVE METABOLIC PANEL
ALT: 479 U/L — ABNORMAL HIGH (ref 0–44)
AST: 577 U/L — ABNORMAL HIGH (ref 15–41)
Albumin: 2.1 g/dL — ABNORMAL LOW (ref 3.5–5.0)
Alkaline Phosphatase: 209 U/L — ABNORMAL HIGH (ref 38–126)
Anion gap: 8 (ref 5–15)
BUN: 10 mg/dL (ref 6–20)
CO2: 24 mmol/L (ref 22–32)
Calcium: 8.4 mg/dL — ABNORMAL LOW (ref 8.9–10.3)
Chloride: 100 mmol/L (ref 98–111)
Creatinine, Ser: 1.17 mg/dL — ABNORMAL HIGH (ref 0.44–1.00)
GFR, Estimated: 55 mL/min — ABNORMAL LOW (ref >60)
Glucose, Bld: 213 mg/dL — ABNORMAL HIGH (ref 70–99)
Potassium: 3.2 mmol/L — ABNORMAL LOW (ref 3.5–5.1)
Sodium: 132 mmol/L — ABNORMAL LOW (ref 135–145)
Total Bilirubin: 3.1 mg/dL — ABNORMAL HIGH (ref 0.3–1.2)
Total Protein: 5.7 g/dL — ABNORMAL LOW (ref 6.5–8.1)

## 2022-07-12 LAB — GLUCOSE, CAPILLARY
Glucose-Capillary: 103 mg/dL — ABNORMAL HIGH (ref 70–99)
Glucose-Capillary: 166 mg/dL — ABNORMAL HIGH (ref 70–99)

## 2022-07-12 LAB — PROTIME-INR
INR: 1.2 (ref 0.8–1.2)
Prothrombin Time: 15.3 seconds — ABNORMAL HIGH (ref 11.4–15.2)

## 2022-07-12 MED ORDER — POTASSIUM CHLORIDE CRYS ER 20 MEQ PO TBCR
40.0000 meq | EXTENDED_RELEASE_TABLET | Freq: Once | ORAL | Status: AC
  Administered 2022-07-12: 40 meq via ORAL
  Filled 2022-07-12: qty 2

## 2022-07-12 MED ORDER — TRAMADOL HCL 50 MG PO TABS
50.0000 mg | ORAL_TABLET | Freq: Two times a day (BID) | ORAL | Status: DC | PRN
  Administered 2022-07-13: 50 mg via ORAL
  Filled 2022-07-12: qty 1

## 2022-07-12 MED ORDER — ATENOLOL 25 MG PO TABS
25.0000 mg | ORAL_TABLET | Freq: Every day | ORAL | Status: DC
  Administered 2022-07-12 – 2022-07-13 (×2): 25 mg via ORAL
  Filled 2022-07-12 (×2): qty 1

## 2022-07-12 MED ORDER — TRAMADOL HCL 50 MG PO TABS: 50.0000 mg | ORAL_TABLET | Freq: Two times a day (BID) | ORAL | 0 refills | Status: DC | PRN

## 2022-07-12 MED ORDER — LIDOCAINE 5 % EX PTCH: 1.0000 | MEDICATED_PATCH | CUTANEOUS | 0 refills | Status: DC

## 2022-07-12 NOTE — Progress Notes (Addendum)
[facility]                                                                              Christina Byrd, is a 57 y.o. female, DOB - May 03, 1965, [id number] Admit date - 07/07/2022    Outpatient Primary MD for the patient is Pcp, No  LOS - 4  days  Chief Complaint  Patient presents with   Fatigue       Brief summary   Patient is a 57 year old female with history of alcohol abuse, seizure disorder, prior history of GI bleed, CKD stage IIIa, anemia of chronic disease, iron deficiency anemia HLP, GERD, history of gout and osteoarthritis right knee, follows orthopedics presented to ED with acute right knee pain.  Patient feels it is like a gout flare, also reported fatigue and tremors. Last alcohol intake was night before the admission, drank 2 beers and 4 shots of hard liquor. In ED, x-ray of the right knee showed nonerosive degenerative changes without evidence of fracture, bipartite patellar configuration.   Assessment & Plan    Principal Problem:   Acute pain of right knee -Patient has history of gout and right knee tricompartmental arthritis, follows orthopedics. -s/p arthrocentesis and cortisone shot by orthopedics on 5/16. -Currently no acute issues with the right knee, feeling better -Counseled strongly on not taking NSAIDs or Tylenol. -Based on low-dose tramadol 50 mg every 12 hours as needed for right knee pain, continue Lidoderm patch   Active Problems: Acute transaminitis, unclear etiology -On admission, LFTs normal, however has been trending up since admission.   -AST 41-> 2075-> >10,000-> 6064-> 1803-> 577 ALT 14-> 239-> 1725-> 1126-> 742-> 479 Alk phos 89-> 154-> 184-> 154-> 181-> 209 -Total bili 0.3-> 1.3-> 2.9-> 3.3-> 3.8-> 3.1 -INR 2.2-> 1.4-> 1.2 -CK normal -Acute hepatitis panel negative -CRP 0.7, ESR 19 -Right upper quadrant ultrasound showed steatosis,?  Cirrhosis, diffuse nonspecific gallbladder wall thickening without Murphy sign,  trace perihepatic ascites -Discontinued venlafaxine due to s/e of hepatotoxicity -EBV negative, CMV negative, UDS+ BZD, volatile standing -Likely due to combination of Tylenol toxicity and heavy alcohol abuse.  -Appreciate GI recommendations, continue IV acetylcysteine to complete course, will complete today evening.  History of alcohol use with risk of acute alcohol withdrawals -continue CIWA with Ativan -Continue thiamine, folate -Currently stable, not in any acute withdrawals  Mild AKI on CKD stage IIIa, NAGMA -Creatinine 1.26 on admission, was 0.9 on 06/29/2022 -Creatinine 1.3 on 5/19, improving today, 1.1     Seizure (HCC) -Continue Keppra    Type 2 diabetes mellitus (HCC), uncontrolled with hyperglycemia -Placed on sliding scale insulin while inpatient, obtain HbA1c -Hemoglobin A1c 4.6 on 04/27/2022    Iron deficiency anemia H&H currently at baseline 10-11  Hypertension -BP stable, tachycardia+, placed on atenolol 25 mg daily Norvasc discontinued   Estimated body mass index is 22.86 kg/m as calculated from the following:   Height as of 06/25/22: 5\' 2"  (1.575 m).   Weight as of this encounter: 56.7 kg.  Code Status: full code  DVT Prophylaxis:  enoxaparin (LOVENOX) injection 40 mg Start: 07/08/22 1000   Level of Care: Level of care: Progressive Family  Communication: Updated patien tDisposition Plan:      Remains inpatient appropriate:     Procedures:   Consultants:   Orthopedics  Gastroenterology  Antimicrobials: none   Medications  allopurinol  100 mg Oral Daily   enoxaparin (LOVENOX) injection  40 mg Subcutaneous Q24H   folic acid  1 mg Oral Daily   insulin aspart  0-5 Units Subcutaneous QHS   insulin aspart  0-9 Units Subcutaneous TID WC   levETIRAcetam  500 mg Oral BID   lidocaine  1 patch Transdermal Q24H   LORazepam  0-4 mg Intravenous Q8H   multivitamin with minerals  1 tablet Oral Daily   pantoprazole  40 mg Oral BID AC   thiamine  100 mg Oral Daily    Or   thiamine  100 mg Intravenous Daily      Subjective:   Christina Byrd was seen and examined today.  No acute complaints, hoping to go home soon.  Right knee pain feels better.  Objective:   Vitals:   07/10/22 2131 07/10/22 2330 07/11/22 0327 07/11/22 0730  BP:  121/76 133/86 (!) 141/86  Pulse: (!) 122 (!) 112 (!) 102 (!) 103  Resp:  16 16   Temp:  97.6 F (36.4 C)    TempSrc:  Oral    SpO2:  95% 95% 96%  Weight:        Intake/Output Summary (Last 24 hours) at 07/11/2022 1304 Last data filed at 07/11/2022 0600 Gross per 24 hour  Intake 1128.38 ml  Output --  Net 1128.38 ml     Wt Readings from Last 3 Encounters:  07/07/22 56.7 kg  06/25/22 56.8 kg  06/03/22 56.8 kg    Physical Exam General: Alert and oriented x 3, NAD Cardiovascular: S1 S2 clear, RRR.  Respiratory: CTAB, no wheezing, rales or rhonchi Gastrointestinal: Soft, nontender, nondistended, NBS Ext: no pedal edema bilaterally Neuro: no new deficits Skin: No rashes Psych: Normal affect  Data Reviewed:  I have personally reviewed following labs    CBC Lab Results  Component Value Date   WBC 8.1 07/11/2022   RBC 3.68 (L) 07/11/2022   HGB 10.6 (L) 07/11/2022   HCT 31.8 (L) 07/11/2022   MCV 86.4 07/11/2022   MCH 28.8 07/11/2022   PLT 101 (L) 07/11/2022   MCHC 33.3 07/11/2022   RDW 15.8 (H) 07/11/2022   LYMPHSABS 1.4 07/07/2022   MONOABS 0.6 07/07/2022   EOSABS 0.0 07/07/2022   BASOSABS 0.0 07/07/2022     Last metabolic panel Lab Results  Component Value Date   NA 135 07/11/2022   K 4.0 07/11/2022   CL 102 07/11/2022   CO2 23 07/11/2022   BUN 7 07/11/2022   CREATININE 1.31 (H) 07/11/2022   GLUCOSE 126 (H) 07/11/2022   GFRNONAA 48 (L) 07/11/2022   GFRAA 61 01/02/2020   CALCIUM 8.8 (L) 07/11/2022   PHOS 2.9 06/29/2022   PROT 5.7 (L) 07/11/2022   ALBUMIN 2.2 (L) 07/11/2022   LABGLOB 3.6 04/27/2022   AGRATIO 1.0 (L) 04/27/2022   BILITOT 3.8 (H) 07/11/2022   ALKPHOS 181 (H)  07/11/2022   AST 1,803 (H) 07/11/2022   ALT 742 (H) 07/11/2022   ANIONGAP 10 07/11/2022    CBG (last 3)  Recent Labs    07/10/22 2103 07/11/22 0727 07/11/22 1114  GLUCAP 175* 111* 111*      Coagulation Profile: Recent Labs  Lab 07/09/22 1600 07/11/22 0403  INR 2.2* 1.4*     Radiology Studies: I have personally  reviewed the imaging studies  No results found.     Thad Ranger M.D. [facility] 07/11/2022, 1:04 PM  Available via Epic secure chat 7am-7pm After 7 pm, please refer to night coverage provider listed on amion.

## 2022-07-12 NOTE — Progress Notes (Signed)
Offered Pt a bath, Pt refused stating that she had a bath last night, Tech advised Pt that a bath has not been noted for a couple of days. Pt continues to refused.

## 2022-07-13 DIAGNOSIS — R569 Unspecified convulsions: Secondary | ICD-10-CM | POA: Diagnosis not present

## 2022-07-13 DIAGNOSIS — I10 Essential (primary) hypertension: Secondary | ICD-10-CM | POA: Diagnosis not present

## 2022-07-13 DIAGNOSIS — M25561 Pain in right knee: Secondary | ICD-10-CM | POA: Diagnosis not present

## 2022-07-13 DIAGNOSIS — F10930 Alcohol use, unspecified with withdrawal, uncomplicated: Secondary | ICD-10-CM | POA: Diagnosis not present

## 2022-07-13 LAB — COMPREHENSIVE METABOLIC PANEL
ALT: 316 U/L — ABNORMAL HIGH (ref 0–44)
AST: 234 U/L — ABNORMAL HIGH (ref 15–41)
Albumin: 2.4 g/dL — ABNORMAL LOW (ref 3.5–5.0)
Alkaline Phosphatase: 228 U/L — ABNORMAL HIGH (ref 38–126)
Anion gap: 9 (ref 5–15)
BUN: 11 mg/dL (ref 6–20)
CO2: 22 mmol/L (ref 22–32)
Calcium: 8.6 mg/dL — ABNORMAL LOW (ref 8.9–10.3)
Chloride: 103 mmol/L (ref 98–111)
Creatinine, Ser: 1.07 mg/dL — ABNORMAL HIGH (ref 0.44–1.00)
GFR, Estimated: >60 mL/min (ref >60)
Glucose, Bld: 188 mg/dL — ABNORMAL HIGH (ref 70–99)
Potassium: 3.4 mmol/L — ABNORMAL LOW (ref 3.5–5.1)
Sodium: 134 mmol/L — ABNORMAL LOW (ref 135–145)
Total Bilirubin: 1.7 mg/dL — ABNORMAL HIGH (ref 0.3–1.2)
Total Protein: 5.9 g/dL — ABNORMAL LOW (ref 6.5–8.1)

## 2022-07-13 LAB — CBC
HCT: 30.5 % — ABNORMAL LOW (ref 36.0–46.0)
Hemoglobin: 10.1 g/dL — ABNORMAL LOW (ref 12.0–15.0)
MCH: 29.2 pg (ref 26.0–34.0)
MCHC: 33.1 g/dL (ref 30.0–36.0)
MCV: 88.2 fL (ref 80.0–100.0)
Platelets: 126 10*3/uL — ABNORMAL LOW (ref 150–400)
RBC: 3.46 MIL/uL — ABNORMAL LOW (ref 3.87–5.11)
RDW: 16.2 % — ABNORMAL HIGH (ref 11.5–15.5)
WBC: 8.4 10*3/uL (ref 4.0–10.5)
nRBC: 0 % (ref 0.0–0.2)

## 2022-07-13 LAB — PROTIME-INR
INR: 1.1 (ref 0.8–1.2)
Prothrombin Time: 14.3 seconds (ref 11.4–15.2)

## 2022-07-13 LAB — GLUCOSE, CAPILLARY
Glucose-Capillary: 214 mg/dL — ABNORMAL HIGH (ref 70–99)
Glucose-Capillary: 96 mg/dL (ref 70–99)

## 2022-07-13 MED ORDER — POTASSIUM CHLORIDE CRYS ER 20 MEQ PO TBCR
40.0000 meq | EXTENDED_RELEASE_TABLET | Freq: Once | ORAL | Status: AC
  Administered 2022-07-13: 40 meq via ORAL
  Filled 2022-07-13: qty 2

## 2022-07-13 MED ORDER — AMLODIPINE BESYLATE 10 MG PO TABS
ORAL_TABLET | ORAL | 1 refills | Status: DC
Start: 2022-07-13 — End: 2022-10-21

## 2022-07-13 MED ORDER — LEVETIRACETAM 500 MG PO TABS
500.0000 mg | ORAL_TABLET | Freq: Two times a day (BID) | ORAL | 3 refills | Status: DC
Start: 2022-07-13 — End: 2022-11-22

## 2022-07-13 NOTE — Discharge Summary (Signed)
Physician Discharge Summary   Patient: Christina Byrd MRN: [id number] DOB: 04-16-1965  Admit date:     07/07/2022  Discharge date: 07/13/22  Discharge Physician: Thad Ranger   PCP: Pcp, No   Recommendations at discharge:   Patient recommended going forward, no Tylenol use or NSAIDs, counseled on alcohol cessation Please follow LFTs in 2 weeks.  Discharge Diagnoses:    Acute pain of right knee Likely drug-induced liver injury due to acetaminophen and alcohol use   Seizure (HCC)   Type 2 diabetes mellitus (HCC)   Hypomagnesemia   Alcohol abuse   Alcohol withdrawal (HCC)   Transaminitis   Iron deficiency anemia   Hospital Course:  Patient is a 57 year old female with history of alcohol abuse, seizure disorder, prior history of GI bleed, CKD stage IIIa, anemia of chronic disease, iron deficiency anemia HLP, GERD, history of gout and osteoarthritis right knee, follows orthopedics presented to ED with acute right knee pain.  Patient feels it is like a gout flare, also reported fatigue and tremors. Last alcohol intake was night before the admission, drank 2 beers and 4 shots of hard liquor. In ED, x-ray of the right knee showed nonerosive degenerative changes without evidence of fracture, bipartite patellar configuration.     Assessment and Plan:   Acute pain of right knee -Patient has history of gout and right knee tricompartmental arthritis, follows orthopedics. -s/p arthrocentesis and cortisone shot by orthopedics on 5/16. -Currently no acute issues with the right knee, feeling better    Acute transaminitis likely due to drug-induced liver injury from taking excessive acetaminophen and alcohol Improving -On admission, LFTs normal, however has been trending up since admission.   -AST 41-> 2075-> >10,000-> 6064-> 1803-> 577-> 234 at discharge ALT 14-> 239-> 1725-> 1126-> 742-> 479-> 316 at discharge Alk phos 89-> 154-> 184-> 154-> 181-> 209-> 228 -Total bili 0.3-> 1.3->  2.9-> 3.3-> 3.8-> 3.1-> 1.7 -INR 2.2-> 1.4-> 1.2-> 1.1 -CK normal -Acute hepatitis panel negative -CRP 0.7, ESR 19 -Right upper quadrant ultrasound showed steatosis,?  Cirrhosis, diffuse nonspecific gallbladder wall thickening without Murphy sign, trace perihepatic ascites -Discontinued venlafaxine due to s/e of hepatotoxicity -EBV negative, CMV negative, UDS+ BZD, volatile standing -Likely due to combination of Tylenol toxicity and heavy alcohol abuse.  -GI was consulted, was started on  IV N-acetylcysteine -Patient completed full course of N-acetylcysteine on 5/20 evening -Outpatient follow-up of LFTs in 10 to 14 days   History of alcohol use with risk of acute alcohol withdrawals -Patient was placed on CIWA with Ativan protocol, thiamine and folate -Improved, not in any withdrawals   Mild AKI on CKD stage IIIa, NAGMA -Creatinine 1.26 on admission, was 0.9 on 06/29/2022 -Creatinine stable, 1.0 at discharge.     Seizure (HCC) -Continue Keppra -Recommend outpatient with her neurologist, Dr. Karel Jarvis     Type 2 diabetes mellitus (HCC), diet controlled -Hemoglobin A1c 5.0     Iron deficiency anemia H&H currently at baseline 10-11     Estimated body mass index is 22.86 kg/m as calculated from the following:   Height as of 06/25/22: 5\' 2"  (1.575 m).   Weight as of this encounter: 56.7 kg.     Pain control - [employer] Controlled Substance Reporting System database was reviewed. and patient was instructed, not to drive, operate heavy machinery, perform activities at heights, swimming or participation in water activities or provide baby-sitting services while on Pain, Sleep and Anxiety Medications; until their outpatient Physician has advised to do so again. Also  recommended to not to take more than prescribed Pain, Sleep and Anxiety Medications.  Consultants: Gastroenterology Procedures performed: None Disposition: Home Diet recommendation:  Discharge Diet Orders (From admission,  onward)     Start     Ordered   07/13/22 0000  Diet - low sodium heart healthy        07/13/22 0853            DISCHARGE MEDICATION: Allergies as of 07/13/2022       Reactions   Losartan Potassium    Rash   Levaquin [levofloxacin In D5w] Rash   Ace Inhibitors Cough   REACTION: cough   Codeine Nausea Only   Cefepime Rash   09/04/12 pm Patient started to break out with small macules after IV Vanco infusion, then macules increased in size after starting cefepime infusion.   Chlorhexidine Itching, Rash   Vancomycin Rash   09/04/12 pm Patient started to break out with small macules after IV Vanco infusion, then macules increased in size after starting cefepime infusion.        Medication List     STOP taking these medications    acetaminophen 500 MG tablet Commonly known as: TYLENOL   atorvastatin 20 MG tablet Commonly known as: LIPITOR   chlordiazePOXIDE 10 MG capsule Commonly known as: LIBRIUM   venlafaxine XR 37.5 MG 24 hr capsule Commonly known as: Effexor XR       TAKE these medications    allopurinol 100 MG tablet Commonly known as: ZYLOPRIM Take 1 tablet (100 mg total) by mouth daily.   amLODipine 10 MG tablet Commonly known as: NORVASC TAKE 1 TABLET(10 MG) BY MOUTH DAILY What changed:  how much to take how to take this when to take this additional instructions   cyanocobalamin 1000 MCG tablet Commonly known as: VITAMIN B12 Take 1 tablet (1,000 mcg total) by mouth daily.   ferrous sulfate 325 (65 FE) MG tablet Take 1 tablet (325 mg total) by mouth 2 (two) times daily with a meal.   folic acid 1 MG tablet Commonly known as: FOLVITE Take 1 tablet (1 mg total) by mouth daily.   levETIRAcetam 500 MG tablet Commonly known as: KEPPRA Take 1 tablet (500 mg total) by mouth 2 (two) times daily.   lidocaine 5 % Commonly known as: LIDODERM Place 1 patch onto the skin daily. Remove & Discard patch within 12 hours or as directed by MD. Apply to R  knee   magnesium oxide 400 (240 Mg) MG tablet Commonly known as: MAG-OX TAKE 1 TABLET BY MOUTH EVERY DAY   multivitamin with minerals Tabs tablet Take 1 tablet by mouth daily.   ondansetron 4 MG disintegrating tablet Commonly known as: ZOFRAN-ODT Take 1 tablet (4 mg total) by mouth every 8 (eight) hours as needed for nausea or vomiting.   pantoprazole 40 MG tablet Commonly known as: PROTONIX Take 1 tablet (40 mg total) by mouth 2 (two) times daily before a meal. What changed: when to take this   thiamine 100 MG tablet Commonly known as: Vitamin B-1 Take 1 tablet (100 mg total) by mouth daily.   traMADol 50 MG tablet Commonly known as: ULTRAM Take 1 tablet (50 mg total) by mouth every 12 (twelve) hours as needed for severe pain.   True Metrix Blood Glucose Test test strip Generic drug: glucose blood Use as instructed   True Metrix Meter Devi 1 kit by Does not apply route 3 (three) times daily. CHECK BLOOD SUGAR UP TO 3  TIMES DAILY. E11.9   TRUEplus Lancets 28G Misc SMARTSIG:Topical 1 to 3 Times Daily        Follow-up Information     Grayce Sessions, NP. Schedule an appointment as soon as possible for a visit in 10 day(s).   Specialty: Internal Medicine Why: for hospital follow-up, obtain labs LFT's Contact information: [address] [place] [address] [phone]         Van Clines, MD. Schedule an appointment as soon as possible for a visit in 2 week(s).   Specialty: Neurology Why: for hospital follow-up for your seizures Contact information: [address] [place] [address] [phone]         Kirtland Bouchard, PA-C. Schedule an appointment as soon as possible for a visit in 2 week(s).   Specialty: Orthopedic Surgery Why: for hospital follow-up for your right knee Contact information: [address] [place] [address] [phone]         Wendall Stade, MD. Schedule an appointment as soon as  possible for a visit in 2 week(s).   Specialty: Cardiology Why: for hospital follow-up for your heart (cardiology) Contact information: [address] [place] [address] [phone]                Discharge Exam: Ceasar Mons Weights   07/07/22 1953  Weight: 56.7 kg   S: No acute complaints, feeling ready to go home.  Tolerating diet, ambulating without any difficulty.  BP 130/74   Pulse 72   Temp 98.4 F (36.9 C) (Oral)   Resp 18   Wt 56.7 kg   LMP 08/23/2011   SpO2 94%   BMI 22.86 kg/m   Physical Exam General: Alert and oriented x 3, NAD Cardiovascular: S1 S2 clear, RRR.  Respiratory: CTAB, no wheezing Gastrointestinal: Soft, nontender, nondistended, NBS Ext: no pedal edema bilaterally Neuro: no new deficits Psych: Normal affect     Condition at discharge: fair  The results of significant diagnostics from this hospitalization (including imaging, microbiology, ancillary and laboratory) are listed below for reference.   Imaging Studies: US Abdomen Limited RUQ (LIVER/GB)  Addendum Date: 07/08/2022   ADDENDUM REPORT: 07/08/2022 16:46 ADDENDUM: Right kidney may be slightly echogenic, correlate with appropriate laboratory values. Electronically Signed   By: Jasmine Pang M.D.   On: 07/08/2022 16:46   Result Date: 07/08/2022 CLINICAL DATA:  Transaminitis EXAM: ULTRASOUND ABDOMEN LIMITED RIGHT UPPER QUADRANT COMPARISON:  CT 05/29/2022 FINDINGS: Gallbladder: No shadowing stones. Diffuse gallbladder wall thickening measuring up to 7.8 mm. Negative sonographic Murphy. Common bile duct: Diameter: 4.3 mm Liver: Echogenic liver parenchyma. Question subtle surface nodularity. No focal hepatic abnormality. Portal vein is patent on color Doppler imaging with normal direction of blood flow towards the liver. Other: Trace perihepatic ascites. IMPRESSION: 1. Echogenic liver parenchyma consistent with steatosis. Question subtle surface nodularity as may be seen with  cirrhosis. 2. Diffuse nonspecific gallbladder wall thickening without sonographic Murphy. 3. Trace perihepatic ascites. Electronically Signed: By: Jasmine Pang M.D. On: 07/08/2022 16:43   DG Knee Complete 4 Views Right  Result Date: 07/07/2022 CLINICAL DATA:  Right knee pain. EXAM: RIGHT KNEE - COMPLETE 4+ VIEW COMPARISON:  AP Lat 02/11/2022. FINDINGS: There is normal bone mineralization without evidence of fracture, dislocation or joint effusions. 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 There is moderate joint space loss and osteophytosis involving the medial femorotibial compartment and mild narrowing and spurring of the patellofemoral compartment. Spurring change  again noted of the tibial spines. The lateral compartment space is maintained. Findings consistent with nonerosive degenerative arthrosis. There are mild enthesopathic changes of the anterior patella. Soft tissues are unremarkable. Comparison to the prior study reveals no significant interval change. IMPRESSION: Nonerosive degenerative changes without evidence of fractures. Bipartite patellar configuration. No significant interval change. Electronically Signed   By: Almira Bar M.D.   On: 07/07/2022 22:28   DG CHEST PORT 1 VIEW  Result Date: 06/25/2022 CLINICAL DATA:  Seizure, altered mental status EXAM: PORTABLE CHEST 1 VIEW COMPARISON:  06/02/2022 FINDINGS: The heart size and mediastinal contours are within normal limits. Both lungs are clear. The visualized skeletal structures are unremarkable. IMPRESSION: No active disease. Electronically Signed   By: Helyn Numbers M.D.   On: 06/25/2022 21:03   CT Head Wo Contrast  Result Date: 06/25/2022 CLINICAL DATA:  Seizures, altered level of consciousness EXAM: CT HEAD WITHOUT CONTRAST TECHNIQUE: Contiguous axial images were obtained from the base of the skull through the vertex without intravenous contrast. RADIATION DOSE REDUCTION: This exam was performed according to the  departmental dose-optimization program which includes automated exposure control, adjustment of the mA and/or kV according to patient size and/or use of iterative reconstruction technique. COMPARISON:  09/06/2017 FINDINGS: Brain: Chronic areas of cortical encephalomalacia are seen within the left frontal and left occipital lobes. No evidence of acute infarct or hemorrhage. The lateral ventricles and midline structures are unremarkable. No acute extra-axial fluid collections. No mass effect. Vascular: No hyperdense vessel or unexpected calcification. Skull: Normal. Negative for fracture or focal lesion. Sinuses/Orbits: No acute finding. Other: None. IMPRESSION: 1. Stable head CT, no acute intracranial process. Electronically Signed   By: Sharlet Salina M.D.   On: 06/25/2022 19:15    Microbiology: Results for orders placed or performed in visit on 02/01/22  Anaerobic and Aerobic Culture     Status: None   Collection Time: 02/01/22  1:50 PM   Specimen: Joint, Knee; Synovial Fluid  Result Value Ref Range Status   MICRO NUMBER: [id number]  Final   SPECIMEN QUALITY: Adequate  Final   Source: SYNOVIAL FLUID  Final   STATUS: FINAL  Final   GRAM STAIN: No organisms or white blood cells seen  Final   ANA RESULT: No anaerobes isolated.  Final   MICRO NUMBER: [id number]  Final   SPECIMEN QUALITY: Adequate  Final   SOURCE: SYNOVIAL FLUID  Final   STATUS: FINAL  Final   AER RESULT: No Growth  Final    Labs: CBC: Recent Labs  Lab 07/07/22 2123 07/08/22 0843 07/09/22 1322 07/11/22 0403 07/12/22 0253 07/13/22 0403  WBC 5.8 8.5 9.4 8.1 6.2 8.4  NEUTROABS 3.8  --   --   --   --   --   HGB 11.1* 10.1* 10.1* 10.6* 9.9* 10.1*  HCT 35.5* 30.9* 30.7* 31.8* 29.5* 30.5*  MCV 93.4 89.6 86.5 86.4 86.0 88.2  PLT 227 73* 77* 101* 101* 126*   Basic Metabolic Panel: Recent Labs  Lab 07/07/22 2123 07/08/22 0843 07/09/22 0921 07/10/22 0403 07/11/22 0403 07/12/22 0253 07/13/22 0403  NA 136   < > 135 136 135  132* 134*  K 4.1   < > 3.8 3.0* 4.0 3.2* 3.4*  CL 103   < > 97* 98 102 100 103  CO2 18*   < > 28 28 23 24 22   GLUCOSE 331*   < > 151* 108* 126* 213* 188*  BUN 10   < > 8 <5*  7 10 11   CREATININE 1.26*   < > 1.14* 0.90 1.31* 1.17* 1.07*  CALCIUM 8.0*   < > 8.7* 8.6* 8.8* 8.4* 8.6*  MG 1.3*  --   --   --   --   --   --    < > = values in this interval not displayed.   Liver Function Tests: Recent Labs  Lab 07/09/22 0921 07/10/22 0403 07/11/22 0403 07/12/22 0253 07/13/22 0403  AST >10,000* 6,064* 1,803* 577* 234*  ALT 1,725* 1,126* 742* 479* 316*  ALKPHOS 184* 154* 181* 209* 228*  BILITOT 2.9* 3.3* 3.8* 3.1* 1.7*  PROT 6.2* 5.6* 5.7* 5.7* 5.9*  ALBUMIN 2.5* 2.2* 2.2* 2.1* 2.4*   CBG: Recent Labs  Lab 07/11/22 2116 07/12/22 0809 07/12/22 1111 07/12/22 2115 07/13/22 0742  GLUCAP 185* 103* 166* 214* 96    Discharge time spent: greater than 30 minutes.  Signed: Thad Ranger, MD [facility] 07/13/2022

## 2022-07-13 NOTE — Plan of Care (Signed)

## 2022-07-14 ENCOUNTER — Telehealth (INDEPENDENT_AMBULATORY_CARE_PROVIDER_SITE_OTHER): Payer: Self-pay

## 2022-07-14 LAB — VOLATILES,BLD-ACETONE,ETHANOL,ISOPROP,METHANOL
Acetone, blood: <0.01 g/dL (ref 0.000–0.010)
Ethanol, blood: <0.01 g/dL (ref 0.000–0.010)
Isopropanol, blood: <0.01 g/dL (ref 0.000–0.010)
Methanol, blood: <0.01 g/dL (ref 0.000–0.010)

## 2022-07-14 NOTE — Transitions of Care (Post Inpatient/ED Visit) (Signed)
   07/14/2022  Name: Christina Byrd MRN: [id number] DOB: 08-01-1965  Today's TOC FU Call Status: Today's TOC FU Call Status:: Unsuccessul Call (1st Attempt) Unsuccessful Call (1st Attempt) Date: 07/14/22  Attempted to reach the patient regarding the most recent Inpatient/ED visit.  Follow Up Plan: Additional outreach attempts will be made to reach the patient to complete the Transitions of Care (Post Inpatient/ED visit) call.   Signature  Agnes Lawrence, CMA (AAMA)  [facility] [phone]

## 2022-07-18 ENCOUNTER — Emergency Department (HOSPITAL_COMMUNITY): Payer: BLUE CROSS/BLUE SHIELD

## 2022-07-18 ENCOUNTER — Encounter (HOSPITAL_COMMUNITY): Payer: Self-pay

## 2022-07-18 ENCOUNTER — Emergency Department (HOSPITAL_COMMUNITY)
Admission: EM | Admit: 2022-07-18 | Discharge: 2022-07-18 | Discharge status: Against Medical Advice | Payer: BLUE CROSS/BLUE SHIELD | Attending: Emergency Medicine | Admitting: Emergency Medicine

## 2022-07-18 ENCOUNTER — Other Ambulatory Visit: Payer: Self-pay

## 2022-07-18 DIAGNOSIS — Z5321 Procedure and treatment not carried out due to patient leaving prior to being seen by health care provider: Secondary | ICD-10-CM | POA: Diagnosis not present

## 2022-07-18 DIAGNOSIS — R079 Chest pain, unspecified: Secondary | ICD-10-CM | POA: Diagnosis present

## 2022-07-18 NOTE — ED Triage Notes (Signed)
Pt reports she is here tonight for chest pain but does not elaborate on where in her chest or when it started. She is tearful stating she is tired of the pain. Her husband is here with her who reports she has drank too much brandy today, states she usually drinks 1/3 of a gallon everyday. Hx of GI bleeding ulcers with blood transfusion as well as hx of seizures.

## 2022-07-18 NOTE — ED Notes (Signed)
Pt is refusing blood work.

## 2022-07-19 ENCOUNTER — Emergency Department (HOSPITAL_COMMUNITY)
Admission: EM | Admit: 2022-07-19 | Discharge: 2022-07-19 | Disposition: A | Discharge status: Home / Self Care | Payer: BLUE CROSS/BLUE SHIELD | Attending: Emergency Medicine | Admitting: Emergency Medicine

## 2022-07-19 ENCOUNTER — Encounter (HOSPITAL_COMMUNITY): Payer: Self-pay

## 2022-07-19 ENCOUNTER — Other Ambulatory Visit: Payer: Self-pay

## 2022-07-19 DIAGNOSIS — R0789 Other chest pain: Secondary | ICD-10-CM | POA: Insufficient documentation

## 2022-07-19 DIAGNOSIS — E876 Hypokalemia: Secondary | ICD-10-CM

## 2022-07-19 DIAGNOSIS — Y908 Blood alcohol level of 240 mg/100 ml or more: Secondary | ICD-10-CM | POA: Diagnosis not present

## 2022-07-19 DIAGNOSIS — R7989 Other specified abnormal findings of blood chemistry: Secondary | ICD-10-CM | POA: Insufficient documentation

## 2022-07-19 DIAGNOSIS — D649 Anemia, unspecified: Secondary | ICD-10-CM | POA: Diagnosis not present

## 2022-07-19 DIAGNOSIS — I10 Essential (primary) hypertension: Secondary | ICD-10-CM | POA: Diagnosis not present

## 2022-07-19 DIAGNOSIS — Z79899 Other long term (current) drug therapy: Secondary | ICD-10-CM | POA: Diagnosis not present

## 2022-07-19 DIAGNOSIS — R079 Chest pain, unspecified: Secondary | ICD-10-CM

## 2022-07-19 DIAGNOSIS — F10120 Alcohol abuse with intoxication, uncomplicated: Secondary | ICD-10-CM | POA: Diagnosis not present

## 2022-07-19 DIAGNOSIS — F10920 Alcohol use, unspecified with intoxication, uncomplicated: Secondary | ICD-10-CM

## 2022-07-19 LAB — CBC
HCT: 29.7 % — ABNORMAL LOW (ref 36.0–46.0)
Hemoglobin: 9.7 g/dL — ABNORMAL LOW (ref 12.0–15.0)
MCH: 29 pg (ref 26.0–34.0)
MCHC: 32.7 g/dL (ref 30.0–36.0)
MCV: 88.9 fL (ref 80.0–100.0)
Platelets: 328 10*3/uL (ref 150–400)
RBC: 3.34 MIL/uL — ABNORMAL LOW (ref 3.87–5.11)
RDW: 17.7 % — ABNORMAL HIGH (ref 11.5–15.5)
WBC: 7 10*3/uL (ref 4.0–10.5)
nRBC: 0 % (ref 0.0–0.2)

## 2022-07-19 LAB — DIFFERENTIAL
Abs Immature Granulocytes: 0.04 10*3/uL (ref 0.00–0.07)
Basophils Absolute: 0.1 10*3/uL (ref 0.0–0.1)
Basophils Relative: 1 %
Eosinophils Absolute: 0 10*3/uL (ref 0.0–0.5)
Eosinophils Relative: 0 %
Immature Granulocytes: 1 %
Lymphocytes Relative: 30 %
Lymphs Abs: 2.1 10*3/uL (ref 0.7–4.0)
Monocytes Absolute: 0.7 10*3/uL (ref 0.1–1.0)
Monocytes Relative: 10 %
Neutro Abs: 4.1 10*3/uL (ref 1.7–7.7)
Neutrophils Relative %: 58 %

## 2022-07-19 LAB — BASIC METABOLIC PANEL
Anion gap: 12 (ref 5–15)
BUN: 15 mg/dL (ref 6–20)
CO2: 15 mmol/L — ABNORMAL LOW (ref 22–32)
Calcium: 7.9 mg/dL — ABNORMAL LOW (ref 8.9–10.3)
Chloride: 115 mmol/L — ABNORMAL HIGH (ref 98–111)
Creatinine, Ser: 1.03 mg/dL — ABNORMAL HIGH (ref 0.44–1.00)
GFR, Estimated: >60 mL/min (ref >60)
Glucose, Bld: 153 mg/dL — ABNORMAL HIGH (ref 70–99)
Potassium: 3.1 mmol/L — ABNORMAL LOW (ref 3.5–5.1)
Sodium: 142 mmol/L (ref 135–145)

## 2022-07-19 LAB — HEPATIC FUNCTION PANEL
ALT: 98 U/L — ABNORMAL HIGH (ref 0–44)
AST: 67 U/L — ABNORMAL HIGH (ref 15–41)
Albumin: 2.6 g/dL — ABNORMAL LOW (ref 3.5–5.0)
Alkaline Phosphatase: 170 U/L — ABNORMAL HIGH (ref 38–126)
Bilirubin, Direct: 0.4 mg/dL — ABNORMAL HIGH (ref 0.0–0.2)
Indirect Bilirubin: 1 mg/dL — ABNORMAL HIGH (ref 0.3–0.9)
Total Bilirubin: 1.4 mg/dL — ABNORMAL HIGH (ref 0.3–1.2)
Total Protein: 6.8 g/dL (ref 6.5–8.1)

## 2022-07-19 LAB — TROPONIN I (HIGH SENSITIVITY)
Troponin I (High Sensitivity): 10 ng/L (ref <18)
Troponin I (High Sensitivity): 12 ng/L (ref <18)

## 2022-07-19 LAB — ETHANOL: Alcohol, Ethyl (B): 261 mg/dL — ABNORMAL HIGH (ref <10)

## 2022-07-19 MED ORDER — ASPIRIN 81 MG PO CHEW
324.0000 mg | CHEWABLE_TABLET | Freq: Once | ORAL | Status: DC
  Filled 2022-07-19: qty 4

## 2022-07-19 MED ORDER — POTASSIUM CHLORIDE CRYS ER 20 MEQ PO TBCR
40.0000 meq | EXTENDED_RELEASE_TABLET | Freq: Once | ORAL | Status: DC
  Filled 2022-07-19: qty 2

## 2022-07-19 MED ORDER — POTASSIUM CHLORIDE CRYS ER 20 MEQ PO TBCR: 20.0000 meq | EXTENDED_RELEASE_TABLET | Freq: Two times a day (BID) | ORAL | 0 refills | Status: DC

## 2022-07-19 NOTE — ED Triage Notes (Signed)
Pt c/o left sided chest pain. Pt states that the pain is from the loss of her mother. Pt does not describe chest pain. Patient endorses drinking today but will not say how much. Unable to get much info from pt

## 2022-07-19 NOTE — ED Provider Notes (Signed)
[place] EMERGENCY DEPARTMENT AT [facility] Provider Note   CSN: [id number] Arrival date & time: 07/19/22  0032     History  Chief Complaint  Patient presents with   Chest Pain    Christina Byrd is a 57 y.o. female.  The history is provided by the patient. The history is limited by the condition of the patient (Clinically intoxicated).  Chest Pain She has history of hypertension, GERD, seizure disorder, alcohol abuse who initially tells me that she is here because her son called the police to bring her in but on further questioning does admit that she was having chest pain earlier tonight.  She denies dyspnea, nausea, diaphoresis.  She states that the pain has resolved.  She had been in the ED earlier and left without being seen.  She does admit that she has been drinking today but will not say how much.   Home Medications Prior to Admission medications   Medication Sig Start Date End Date Taking? Authorizing Provider  allopurinol (ZYLOPRIM) 100 MG tablet Take 1 tablet (100 mg total) by mouth daily. 01/13/22   Grayce Sessions, NP  amLODipine (NORVASC) 10 MG tablet TAKE 1 TABLET(10 MG) BY MOUTH DAILY 07/13/22   Rai, Ripudeep K, MD  Blood Glucose Monitoring Suppl (TRUE METRIX METER) DEVI 1 kit by Does not apply route 3 (three) times daily. CHECK BLOOD SUGAR UP TO 3 TIMES DAILY. E11.9 02/27/19   Hoy Register, MD  ferrous sulfate 325 (65 FE) MG tablet Take 1 tablet (325 mg total) by mouth 2 (two) times daily with a meal. 06/05/22   Leroy Sea, MD  folic acid (FOLVITE) 1 MG tablet Take 1 tablet (1 mg total) by mouth daily. 06/05/22   Leroy Sea, MD  glucose blood (TRUE METRIX BLOOD GLUCOSE TEST) test strip Use as instructed 03/06/20   Hoy Register, MD  levETIRAcetam (KEPPRA) 500 MG tablet Take 1 tablet (500 mg total) by mouth 2 (two) times daily. 07/13/22 11/10/22  Rai, Ripudeep K, MD  lidocaine (LIDODERM) 5 % Place 1 patch onto the skin daily. Remove &  Discard patch within 12 hours or as directed by MD. Apply to R knee 07/13/22   Rai, Ripudeep K, MD  magnesium oxide (MAG-OX) 400 (240 Mg) MG tablet TAKE 1 TABLET BY MOUTH EVERY DAY 06/24/21   Hoy Register, MD  Multiple Vitamin (MULTIVITAMIN WITH MINERALS) TABS tablet Take 1 tablet by mouth daily.    [provider]  ondansetron (ZOFRAN-ODT) 4 MG disintegrating tablet Take 1 tablet (4 mg total) by mouth every 8 (eight) hours as needed for nausea or vomiting. 05/29/22   Rancour, Jeannett Senior, MD  pantoprazole (PROTONIX) 40 MG tablet Take 1 tablet (40 mg total) by mouth 2 (two) times daily before a meal. Patient taking differently: Take 40 mg by mouth 2 (two) times daily. 06/05/22   Leroy Sea, MD  thiamine (VITAMIN B-1) 100 MG tablet Take 1 tablet (100 mg total) by mouth daily. Patient not taking: Reported on 07/12/2022 07/28/20   Van Clines, MD  traMADol (ULTRAM) 50 MG tablet Take 1 tablet (50 mg total) by mouth every 12 (twelve) hours as needed for severe pain. 07/12/22   Rai, Delene Ruffini, MD  TRUEplus Lancets 28G MISC SMARTSIG:Topical 1 to 3 Times Daily 03/06/20   Hoy Register, MD  vitamin B-12 (CYANOCOBALAMIN) 1000 MCG tablet Take 1 tablet (1,000 mcg total) by mouth daily. Patient not taking: Reported on 07/12/2022 07/28/20   Karel Jarvis,  Lesle Chris, MD  metFORMIN (GLUCOPHAGE) 500 MG tablet Take 0.5 tablets (250 mg total) by mouth daily with breakfast. 03/05/20 04/02/20  Anders Simmonds, PA-C      Allergies    Losartan potassium, Levaquin [levofloxacin in d5w], Ace inhibitors, Codeine, Cefepime, Chlorhexidine, and Vancomycin    Review of Systems   Review of Systems  Unable to perform ROS: Mental status change  Cardiovascular:  Positive for chest pain.    Physical Exam Updated Vital Signs BP 137/84 (BP Location: Right Arm)   Pulse (!) 102   Temp 99.1 F (37.3 C) (Oral)   Resp 20   Ht 5\' 2"  (1.575 m)   Wt 56.7 kg   LMP 08/23/2011   SpO2 100%   BMI 22.86 kg/m  Physical Exam Vitals  and nursing note reviewed.   57 year old female, somewhat agitated, but resting comfortably and in no acute distress. Vital signs are normal. Oxygen saturation is 100%, which is normal. Head is normocephalic and atraumatic. PERRLA, EOMI. Oropharynx is clear. Neck is nontender and supple without adenopathy or JVD. Back is nontender and there is no CVA tenderness. Lungs are clear without rales, wheezes, or rhonchi. Chest is nontender. Heart has regular rate and rhythm without murmur. Abdomen is soft, flat, nontender. Extremities have no cyanosis or edema, full range of motion is present. Skin is warm and dry without rash. Neurologic: Awake and alert, clinically intoxicated with loud speech and somewhat confrontational, cranial nerves are intact, moves all extremities equally.  ED Results / Procedures / Treatments   Labs (all labs ordered are listed, but only abnormal results are displayed) Labs Reviewed  ETHANOL - Abnormal; Notable for the following components:      Result Value   Alcohol, Ethyl (B) 261 (*)    All other components within normal limits  HEPATIC FUNCTION PANEL - Abnormal; Notable for the following components:   Albumin 2.6 (*)    AST 67 (*)    ALT 98 (*)    Alkaline Phosphatase 170 (*)    Total Bilirubin 1.4 (*)    Bilirubin, Direct 0.4 (*)    Indirect Bilirubin 1.0 (*)    All other components within normal limits  CBC WITH DIFFERENTIAL/PLATELET  TROPONIN I (HIGH SENSITIVITY)    EKG ALTON, FALLETTA ZO:[id number] 19-Jul-2022 00:31:43 [facility]/ED ROUTINE RECORD November 20, 1965 (56 yr) Female Black Room:[facility] Loc:11 Technician: [id number] Test JYN:WGNFA Pain Vent. rate 96 BPM PR interval 184 ms QRS duration 62 ms QT/QTcB 332/419 ms P-R-T axes 68 56 45 Normal sinus rhythm Normal ECG When compared with ECG of 18-Jul-2022 20:54, No significant change was found Confirmed by Dione Booze ([id number]) on 07/19/2022 1:45:51 AM Confirmed By: Dione Booze  Radiology DG Chest 2 View  Result Date: 07/18/2022 CLINICAL DATA:  Chest pain EXAM: CHEST - 2 VIEW COMPARISON:  Jun 25, 2022 FINDINGS: The heart size and mediastinal contours are within normal limits. Both lungs are clear. The visualized skeletal structures are unremarkable. IMPRESSION: No active cardiopulmonary disease. Electronically Signed   By: Aram Candela M.D.   On: 07/18/2022 21:19    Procedures Procedures  Cardiac monitor shows normal sinus rhythm, per my interpretation.  Medications Ordered in ED Medications  aspirin chewable tablet 324 mg (has no administration in time range)    ED Course/ Medical Decision Making/ A&P  Medical Decision Making Amount and/or Complexity of Data Reviewed Labs: ordered.  Risk OTC drugs. Prescription drug management.   Patient with episode of chest pain which appears to resolved, clinically intoxicated.  Per triage note at 2101 on 07/18/2022 she was tearful and complaining of chest pain and stated that she was tired of it but she refused blood work and left without being seen.  I have reviewed and interpreted her electrocardiogram, and my interpretation is normal ECG unchanged from ECG from earlier tonight.  She has recent hospitalizations including 07/07/2022 for alcohol withdrawal syndrome, 06/25/2022 for seizures, 06/02/2022 for GI bleeding.  No evidence of clinical GI bleeding currently.  I have ordered laboratory workup of CBC, comprehensive metabolic panel, troponin, ethanol level.  With uncertain cause of chest pain, I have ordered a dose of aspirin.  She is noted to have had an echocardiogram on 05/02/2019 which showed ejection fraction 50-55%, grade 1 diastolic dysfunction.  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  I have ordered a dose of oral potassium and I am discharging patient with a prescription with potassium.  I have advised her that she needs to stop drinking to prevent permanent liver damage.  Final Clinical Impression(s) / ED Diagnoses Final diagnoses:  Nonspecific chest pain  Alcohol intoxication, uncomplicated (HCC)  Elevated liver function tests  Normochromic normocytic anemia  Hypokalemia    Rx / DC Orders ED Discharge Orders          Ordered    potassium chloride SA (KLOR-CON M) 20 MEQ tablet  2 times daily        07/19/22 1610              Dione Booze, MD 07/19/22 [phone]

## 2022-07-19 NOTE — ED Notes (Signed)
Refused vitals 

## 2022-07-19 NOTE — Discharge Instructions (Addendum)
Your blood tests show that your liver is being affected by how much you are drinking.  It is very important for you to stop drinking or you may develop permanent liver damage.

## 2022-07-20 NOTE — Transitions of Care (Post Inpatient/ED Visit) (Signed)
   07/20/2022  Name: Christina Byrd MRN: [id number] DOB: 05-Jan-1966  Today's TOC FU Call Status: Today's TOC FU Call Status:: Unsuccessful Call (2nd Attempt) Unsuccessful Call (1st Attempt) Date: 07/14/22 Unsuccessful Call (2nd Attempt) Date: 07/20/22  Attempted to reach the patient regarding the most recent Inpatient/ED visit.  Follow Up Plan: Additional outreach attempts will be made to reach the patient to complete the Transitions of Care (Post Inpatient/ED visit) call.   Signature  Agnes Lawrence, CMA (AAMA)  [facility] [phone]

## 2022-07-22 ENCOUNTER — Encounter (HOSPITAL_COMMUNITY): Payer: Self-pay

## 2022-07-22 ENCOUNTER — Emergency Department (HOSPITAL_COMMUNITY)
Admission: EM | Admit: 2022-07-22 | Discharge: 2022-07-23 | Disposition: A | Discharge status: Home / Self Care | Payer: BLUE CROSS/BLUE SHIELD | Attending: Emergency Medicine | Admitting: Emergency Medicine

## 2022-07-22 ENCOUNTER — Emergency Department (HOSPITAL_COMMUNITY): Payer: BLUE CROSS/BLUE SHIELD

## 2022-07-22 DIAGNOSIS — Y908 Blood alcohol level of 240 mg/100 ml or more: Secondary | ICD-10-CM | POA: Insufficient documentation

## 2022-07-22 DIAGNOSIS — Z79899 Other long term (current) drug therapy: Secondary | ICD-10-CM | POA: Insufficient documentation

## 2022-07-22 DIAGNOSIS — I10 Essential (primary) hypertension: Secondary | ICD-10-CM | POA: Insufficient documentation

## 2022-07-22 DIAGNOSIS — F10920 Alcohol use, unspecified with intoxication, uncomplicated: Secondary | ICD-10-CM

## 2022-07-22 DIAGNOSIS — R000 Tachycardia, unspecified: Secondary | ICD-10-CM | POA: Insufficient documentation

## 2022-07-22 DIAGNOSIS — R1013 Epigastric pain: Secondary | ICD-10-CM

## 2022-07-22 DIAGNOSIS — F10129 Alcohol abuse with intoxication, unspecified: Secondary | ICD-10-CM | POA: Diagnosis not present

## 2022-07-22 MED ORDER — PANTOPRAZOLE SODIUM 40 MG IV SOLR
40.0000 mg | Freq: Once | INTRAVENOUS | Status: AC
  Administered 2022-07-23: 40 mg via INTRAVENOUS
  Filled 2022-07-22: qty 10

## 2022-07-22 MED ORDER — LORAZEPAM 2 MG/ML IJ SOLN: 1.0000 mg | INTRAMUSCULAR | Status: DC | PRN

## 2022-07-22 MED ORDER — THIAMINE HCL 100 MG/ML IJ SOLN: 100.0000 mg | Freq: Every day | INTRAMUSCULAR | Status: DC

## 2022-07-22 MED ORDER — LORAZEPAM 1 MG PO TABS: 1.0000 mg | ORAL_TABLET | ORAL | Status: DC | PRN

## 2022-07-22 MED ORDER — FOLIC ACID 1 MG PO TABS: 1.0000 mg | ORAL_TABLET | Freq: Every day | ORAL | Status: DC

## 2022-07-22 MED ORDER — LEVETIRACETAM 500 MG PO TABS
500.0000 mg | ORAL_TABLET | Freq: Once | ORAL | Status: AC
  Administered 2022-07-23: 500 mg via ORAL
  Filled 2022-07-22: qty 1

## 2022-07-22 MED ORDER — THIAMINE MONONITRATE 100 MG PO TABS: 100.0000 mg | ORAL_TABLET | Freq: Every day | ORAL | Status: DC

## 2022-07-22 MED ORDER — ADULT MULTIVITAMIN W/MINERALS CH: 1.0000 | ORAL_TABLET | Freq: Every day | ORAL | Status: DC

## 2022-07-22 MED ORDER — LACTATED RINGERS IV BOLUS
1000.0000 mL | Freq: Once | INTRAVENOUS | Status: AC
  Administered 2022-07-23: 1000 mL via INTRAVENOUS

## 2022-07-22 NOTE — ED Triage Notes (Signed)
Pt presents to ED for evaluation of mid abdominal pain. Hx of cirrhosis.

## 2022-07-22 NOTE — ED Provider Notes (Signed)
[place] EMERGENCY DEPARTMENT AT  [facility] Provider Note   CSN: [id number] Arrival date & time: 07/22/22  2218     History  Chief Complaint  Patient presents with   Abdominal Pain    Christina Byrd is a 57 y.o. female with PMH GERD, HTN, seizure disorder, alcohol use disorder, who presents to ED c/o epigastric abdominal pain. States this has been going on for 2 weeks since last hospitalized at [facility]. Says at that time diagnosed with hepatitis. Unsure if it feels similar. Uncertain if pain improved at discharge. Pt appears intoxicated. Admits to drinking daily including today. [facility] note from previous hospitalization and GI believed pt presentation likely secondary to medication/toxic effect from Tylenol worsened in setting of alcohol abuse. Pt denies nausea, vomiting, diarrhea, fever, chills, urinary symptoms, chest pain, shortness of breath. Pt reports medication adherence, family member at bedside states pt has not been taking medications. Pt then states this is true and she has not been taking medications due to poor literacy. Remainder of history unable to obtain secondary to suspected alcohol intoxication.       Home Medications Prior to Admission medications   Medication Sig Start Date End Date Taking? Authorizing Provider  allopurinol (ZYLOPRIM) 100 MG tablet Take 1 tablet (100 mg total) by mouth daily. 01/13/22  Yes Grayce Sessions, NP  amLODipine (NORVASC) 10 MG tablet TAKE 1 TABLET(10 MG) BY MOUTH DAILY Patient taking differently: Take 10 mg by mouth daily. TAKE 1 TABLET(10 MG) BY MOUTH DAILY 07/13/22  Yes Rai, Ripudeep K, MD  ferrous sulfate 325 (65 FE) MG tablet Take 1 tablet (325 mg total) by mouth 2 (two) times daily with a meal. 06/05/22  Yes Leroy Sea, MD  folic acid (FOLVITE) 1 MG tablet Take 1 tablet (1 mg total) by mouth daily. 06/05/22  Yes Leroy Sea, MD  levETIRAcetam (KEPPRA) 500 MG tablet Take 1 tablet (500 mg total) by  mouth 2 (two) times daily. 07/13/22 11/10/22 Yes Rai, Ripudeep K, MD  lidocaine (LIDODERM) 5 % Place 1 patch onto the skin daily. Remove & Discard patch within 12 hours or as directed by MD. Apply to R knee 07/13/22  Yes Rai, Ripudeep K, MD  magnesium oxide (MAG-OX) 400 (240 Mg) MG tablet TAKE 1 TABLET BY MOUTH EVERY DAY Patient taking differently: Take 400 mg by mouth daily. 06/24/21  Yes Hoy Register, MD  Multiple Vitamin (MULTIVITAMIN WITH MINERALS) TABS tablet Take 1 tablet by mouth daily.   Yes [provider]  ondansetron (ZOFRAN-ODT) 4 MG disintegrating tablet Take 1 tablet (4 mg total) by mouth every 8 (eight) hours as needed for nausea or vomiting. 05/29/22  Yes Rancour, Jeannett Senior, MD  pantoprazole (PROTONIX) 40 MG tablet Take 1 tablet (40 mg total) by mouth 2 (two) times daily before a meal. Patient taking differently: Take 40 mg by mouth 2 (two) times daily. 06/05/22  Yes Leroy Sea, MD  potassium chloride SA (KLOR-CON M) 20 MEQ tablet Take 1 tablet (20 mEq total) by mouth 2 (two) times daily. 07/19/22  Yes Dione Booze, MD  Blood Glucose Monitoring Suppl (TRUE METRIX METER) DEVI 1 kit by Does not apply route 3 (three) times daily. CHECK BLOOD SUGAR UP TO 3 TIMES DAILY. E11.9 02/27/19   Hoy Register, MD  glucose blood (TRUE METRIX BLOOD GLUCOSE TEST) test strip Use as instructed 03/06/20   Hoy Register, MD  thiamine (VITAMIN B-1) 100 MG tablet Take 1 tablet (100 mg total) by  mouth daily. Patient not taking: Reported on 07/12/2022 07/28/20   Van Clines, MD  traMADol (ULTRAM) 50 MG tablet Take 1 tablet (50 mg total) by mouth every 12 (twelve) hours as needed for severe pain. Patient not taking: Reported on 07/22/2022 07/12/22   Cathren Harsh, MD  TRUEplus Lancets 28G MISC SMARTSIG:Topical 1 to 3 Times Daily 03/06/20   Hoy Register, MD  vitamin B-12 (CYANOCOBALAMIN) 1000 MCG tablet Take 1 tablet (1,000 mcg total) by mouth daily. Patient not taking: Reported on 07/12/2022 07/28/20    Van Clines, MD  metFORMIN (GLUCOPHAGE) 500 MG tablet Take 0.5 tablets (250 mg total) by mouth daily with breakfast. 03/05/20 04/02/20  Anders Simmonds, PA-C      Allergies    Losartan potassium, Levaquin [levofloxacin in d5w], Ace inhibitors, Codeine, Cefepime, Chlorhexidine, and Vancomycin    Review of Systems   Review of Systems  All other systems reviewed and are negative.   Physical Exam Updated Vital Signs BP 139/89 (BP Location: Right Arm)   Pulse (!) 130   Temp 98.2 F (36.8 C)   Resp 20   LMP 08/23/2011   SpO2 100%  Physical Exam Vitals and nursing note reviewed.  Constitutional:      General: She is not in acute distress.    Appearance: Normal appearance. She is not ill-appearing, toxic-appearing or diaphoretic.  HENT:     Head: Normocephalic and atraumatic.     Mouth/Throat:     Comments: Moderately dry mucus membranes Eyes:     Extraocular Movements: Extraocular movements intact.     Conjunctiva/sclera: Conjunctivae normal.  Cardiovascular:     Rate and Rhythm: Regular rhythm. Tachycardia present.     Heart sounds: No murmur heard. Pulmonary:     Effort: Pulmonary effort is normal. No respiratory distress.     Breath sounds: Normal breath sounds. No stridor. No wheezing, rhonchi or rales.  Chest:     Chest wall: No tenderness.  Abdominal:     General: Abdomen is protuberant.     Palpations: Abdomen is soft. There is no shifting dullness, fluid wave or pulsatile mass.     Tenderness: There is no abdominal tenderness. There is no right CVA tenderness, left CVA tenderness, guarding or rebound. Negative signs include Murphy's sign.  Musculoskeletal:        General: Normal range of motion.     Cervical back: Neck supple.     Right lower leg: No edema.     Left lower leg: No edema.  Skin:    General: Skin is warm and dry.     Capillary Refill: Capillary refill takes less than 2 seconds.  Neurological:     General: No focal deficit present.     Mental  Status: She is alert. Mental status is at baseline.  Psychiatric:        Speech: Speech normal.        Behavior: Behavior is cooperative.     Comments: Appears intoxicated     ED Results / Procedures / Treatments   Labs (all labs ordered are listed, but only abnormal results are displayed) Labs Reviewed  CBC WITH DIFFERENTIAL/PLATELET  COMPREHENSIVE METABOLIC PANEL  LIPASE, BLOOD  URINALYSIS, ROUTINE W REFLEX MICROSCOPIC  RAPID URINE DRUG SCREEN, HOSP PERFORMED  ETHANOL  MAGNESIUM  ACETAMINOPHEN LEVEL    EKG None  Radiology No results found.  Procedures Procedures    Medications Ordered in ED Medications  pantoprazole (PROTONIX) injection 40 mg (has no administration in  time range)  lactated ringers bolus 1,000 mL (has no administration in time range)  LORazepam (ATIVAN) tablet 1-4 mg (has no administration in time range)    Or  LORazepam (ATIVAN) injection 1-4 mg (has no administration in time range)  thiamine (VITAMIN B1) tablet 100 mg (has no administration in time range)    Or  thiamine (VITAMIN B1) injection 100 mg (has no administration in time range)  folic acid (FOLVITE) tablet 1 mg (has no administration in time range)  multivitamin with minerals tablet 1 tablet (has no administration in time range)  levETIRAcetam (KEPPRA) tablet 500 mg (has no administration in time range)    ED Course/ Medical Decision Making/ A&P                             Medical Decision Making Amount and/or Complexity of Data Reviewed Labs: ordered. Decision-making details documented in ED Course. Radiology: ordered. Decision-making details documented in ED Course. ECG/medicine tests: ordered. Decision-making details documented in ED Course.  Risk OTC drugs. Prescription drug management.   Medical Decision Making:   SHANTAVIOUS ALGER is a 57 y.o. female who presented to the ED today with abdominal pain detailed above.    Additional history discussed with patient's  family/caregivers.  Patient's presentation is complicated by their history of alcohol abuse, cirrhosis, medication non-adherence.  Complete initial physical exam performed, notably the patient  was in NAD. Tachycardic with regular rhythm. LCTA. Protuberant abdomen, nontender. No rebound, guarding, or peritoneal signs. No fluid wave. No CVA tenderness. Appears intoxicated.     Reviewed and confirmed nursing documentation for past medical history, family history, social history.    Initial Assessment:   With the patient's presentation of abdominal pain, differential diagnosis includes but is not limited to AAA, mesenteric ischemia, appendicitis, diverticulitis, DKA, gastritis, gastroenteritis, AMI, nephrolithiasis, pancreatitis, peritonitis, adrenal insufficiency, intestinal ischemia, constipation, UTI, SBO/LBO, splenic rupture, biliary disease, IBD, IBS, PUD, hepatitis, STD, ovarian/testicular torsion, electrolyte disturbance, DKA, dehydration, acute kidney injury, renal failure, cholecystitis, cholelithiasis, choledocholithiasis, abdominal pain of  unknown etiology.   Initial Plan:  Screening labs including CBC and Metabolic panel to evaluate for infectious or metabolic etiology of disease.  Lipase to evaluate for pancreatitis Urinalysis with reflex culture ordered to evaluate for UTI or relevant urologic/nephrologic pathology.  RUQ [place] to evaluate for intra-abdominal pathology EKG to evaluate for cardiac pathology Ethanol level with suspected intoxication CIWA protocol Dose of AED given non-adherence Symptomatic management Objective evaluation as reviewed   Final Assessment and Plan:   57 year old female presenting to ED c/o epigastric abdominal pain. Questionably similar and ongoing from previous presentation with hospitalization 2 weeks ago when found to have cirrhosis. Pt clinically intoxicated, admits to alcohol use today. Abdomen soft, nontender. Pt nontoxic appearing. Review of GI notes  shows questions Tylenol toxicity and alcohol abuse contributing to liver disease. Workup initiated as above for further assessment. Tachycardic, non-treumulous. With history seizure disorder in setting of alcohol abuse, CIWA protocol ordered as well as home dose Keppra. Workup pending at shift change and care transitioned to oncoming PA-C, Roxy Horseman, pending workup and disposition.     Clinical Impression:  1. Epigastric pain   2. Alcoholic intoxication without complication (HCC)      Data Unavailable        Final Clinical Impression(s) / ED Diagnoses Final diagnoses:  Epigastric pain  Alcoholic intoxication without complication (HCC)    Rx / DC Orders ED Discharge  Orders     None         Richardson Dopp 07/22/22 [id number]    Vanetta Mulders, MD 07/24/22 [phone]

## 2022-07-23 DIAGNOSIS — E876 Hypokalemia: Secondary | ICD-10-CM | POA: Diagnosis not present

## 2022-07-23 DIAGNOSIS — E11649 Type 2 diabetes mellitus with hypoglycemia without coma: Secondary | ICD-10-CM | POA: Diagnosis not present

## 2022-07-23 LAB — RAPID URINE DRUG SCREEN, HOSP PERFORMED
Amphetamines: NOT DETECTED
Barbiturates: NOT DETECTED
Benzodiazepines: POSITIVE — AB
Cocaine: NOT DETECTED
Opiates: NOT DETECTED
Tetrahydrocannabinol: NOT DETECTED

## 2022-07-23 LAB — URINALYSIS, ROUTINE W REFLEX MICROSCOPIC
Bilirubin Urine: NEGATIVE
Glucose, UA: NEGATIVE mg/dL
Hgb urine dipstick: NEGATIVE
Ketones, ur: 5 mg/dL — AB
Leukocytes,Ua: NEGATIVE
Nitrite: NEGATIVE
Protein, ur: NEGATIVE mg/dL
Specific Gravity, Urine: 1.012 (ref 1.005–1.030)
pH: 6 (ref 5.0–8.0)

## 2022-07-23 LAB — COMPREHENSIVE METABOLIC PANEL
ALT: 71 U/L — ABNORMAL HIGH (ref 0–44)
AST: 134 U/L — ABNORMAL HIGH (ref 15–41)
Albumin: 3.1 g/dL — ABNORMAL LOW (ref 3.5–5.0)
Alkaline Phosphatase: 205 U/L — ABNORMAL HIGH (ref 38–126)
Anion gap: 20 — ABNORMAL HIGH (ref 5–15)
BUN: 18 mg/dL (ref 6–20)
CO2: 11 mmol/L — ABNORMAL LOW (ref 22–32)
Calcium: 8 mg/dL — ABNORMAL LOW (ref 8.9–10.3)
Chloride: 114 mmol/L — ABNORMAL HIGH (ref 98–111)
Creatinine, Ser: 0.92 mg/dL (ref 0.44–1.00)
GFR, Estimated: >60 mL/min (ref >60)
Glucose, Bld: 33 mg/dL — CL (ref 70–99)
Potassium: 4.3 mmol/L (ref 3.5–5.1)
Sodium: 145 mmol/L (ref 135–145)
Total Bilirubin: 1.2 mg/dL (ref 0.3–1.2)
Total Protein: 6.8 g/dL (ref 6.5–8.1)

## 2022-07-23 LAB — CBC WITH DIFFERENTIAL/PLATELET
Abs Immature Granulocytes: 0.15 10*3/uL — ABNORMAL HIGH (ref 0.00–0.07)
Basophils Absolute: 0.1 10*3/uL (ref 0.0–0.1)
Basophils Relative: 1 %
Eosinophils Absolute: 0 10*3/uL (ref 0.0–0.5)
Eosinophils Relative: 0 %
HCT: 33.1 % — ABNORMAL LOW (ref 36.0–46.0)
Hemoglobin: 10.3 g/dL — ABNORMAL LOW (ref 12.0–15.0)
Immature Granulocytes: 2 %
Lymphocytes Relative: 19 %
Lymphs Abs: 1.8 10*3/uL (ref 0.7–4.0)
MCH: 29.2 pg (ref 26.0–34.0)
MCHC: 31.1 g/dL (ref 30.0–36.0)
MCV: 93.8 fL (ref 80.0–100.0)
Monocytes Absolute: 0.6 10*3/uL (ref 0.1–1.0)
Monocytes Relative: 6 %
Neutro Abs: 7.2 10*3/uL (ref 1.7–7.7)
Neutrophils Relative %: 72 %
Platelets: 290 10*3/uL (ref 150–400)
RBC: 3.53 MIL/uL — ABNORMAL LOW (ref 3.87–5.11)
RDW: 18.6 % — ABNORMAL HIGH (ref 11.5–15.5)
WBC: 9.9 10*3/uL (ref 4.0–10.5)
nRBC: 0 % (ref 0.0–0.2)

## 2022-07-23 LAB — MAGNESIUM: Magnesium: 1.5 mg/dL — ABNORMAL LOW (ref 1.7–2.4)

## 2022-07-23 LAB — CBG MONITORING, ED
Glucose-Capillary: 76 mg/dL (ref 70–99)
Glucose-Capillary: 105 mg/dL — ABNORMAL HIGH (ref 70–99)

## 2022-07-23 LAB — ETHANOL: Alcohol, Ethyl (B): 273 mg/dL — ABNORMAL HIGH (ref <10)

## 2022-07-23 LAB — ACETAMINOPHEN LEVEL: Acetaminophen (Tylenol), Serum: <10 ug/mL — ABNORMAL LOW (ref 10–30)

## 2022-07-23 LAB — LIPASE, BLOOD: Lipase: 104 U/L — ABNORMAL HIGH (ref 11–51)

## 2022-07-23 MED ORDER — MAGNESIUM SULFATE 2 GM/50ML IV SOLN
2.0000 g | INTRAVENOUS | Status: AC
  Administered 2022-07-23: 2 g via INTRAVENOUS
  Filled 2022-07-23: qty 50

## 2022-07-23 MED ORDER — DEXTROSE 50 % IV SOLN: 1.0000 | Freq: Once | INTRAVENOUS | Status: DC

## 2022-07-23 MED ORDER — SODIUM CHLORIDE 0.9 % IV BOLUS
1000.0000 mL | Freq: Once | INTRAVENOUS | Status: AC
  Administered 2022-07-23: 1000 mL via INTRAVENOUS

## 2022-07-23 MED ORDER — GLUCOSE 40 % PO GEL
1.0000 | Freq: Once | ORAL | Status: AC
  Administered 2022-07-23: 31 g via ORAL
  Filled 2022-07-23: qty 1

## 2022-07-23 NOTE — ED Notes (Signed)
Pt ambulated from ed with steady gait. Pt verbalized understanding to discharge instructions. Family to drive home

## 2022-07-23 NOTE — ED Provider Notes (Signed)
Patient reassessed several times by me.  She complains of epigastric pain initially, but states that this is resolved after treatment.  Heart rate was initially in the 130s, but is downtrending now.  Most recent was 112 after some fluid.  She drinks heavily.  She had elevated ethanol level, but is alert and oriented.  She has downtrending LFTs and lipase.  Ultrasound is reassuring.  Patient is requesting discharge.  Given that her lab work is improved, heart rate is trending down, and she is feeling better, feel that discharge is appropriate as per her request.   Roxy Horseman, PA-C 07/23/22 [id number]    Zadie Rhine, MD 07/23/22 [phone]

## 2022-07-26 ENCOUNTER — Inpatient Hospital Stay (HOSPITAL_COMMUNITY)
Admission: EM | Admit: 2022-07-26 | Discharge: 2022-07-31 | DRG: 638 | Disposition: A | Discharge status: Home / Self Care | Payer: BLUE CROSS/BLUE SHIELD | Attending: Family Medicine | Admitting: Family Medicine

## 2022-07-26 ENCOUNTER — Other Ambulatory Visit: Payer: Self-pay

## 2022-07-26 ENCOUNTER — Emergency Department (HOSPITAL_COMMUNITY): Payer: BLUE CROSS/BLUE SHIELD

## 2022-07-26 ENCOUNTER — Encounter (HOSPITAL_COMMUNITY): Payer: Self-pay

## 2022-07-26 DIAGNOSIS — G40909 Epilepsy, unspecified, not intractable, without status epilepticus: Secondary | ICD-10-CM | POA: Diagnosis not present

## 2022-07-26 DIAGNOSIS — Z87891 Personal history of nicotine dependence: Secondary | ICD-10-CM

## 2022-07-26 DIAGNOSIS — M79661 Pain in right lower leg: Secondary | ICD-10-CM | POA: Diagnosis not present

## 2022-07-26 DIAGNOSIS — Z91148 Patient's other noncompliance with medication regimen for other reason: Secondary | ICD-10-CM

## 2022-07-26 DIAGNOSIS — E876 Hypokalemia: Secondary | ICD-10-CM | POA: Diagnosis present

## 2022-07-26 DIAGNOSIS — Z79899 Other long term (current) drug therapy: Secondary | ICD-10-CM | POA: Diagnosis not present

## 2022-07-26 DIAGNOSIS — F1010 Alcohol abuse, uncomplicated: Secondary | ICD-10-CM | POA: Diagnosis present

## 2022-07-26 DIAGNOSIS — K219 Gastro-esophageal reflux disease without esophagitis: Secondary | ICD-10-CM | POA: Diagnosis present

## 2022-07-26 DIAGNOSIS — E872 Acidosis, unspecified: Secondary | ICD-10-CM | POA: Diagnosis present

## 2022-07-26 DIAGNOSIS — E162 Hypoglycemia, unspecified: Secondary | ICD-10-CM | POA: Diagnosis not present

## 2022-07-26 DIAGNOSIS — Z7984 Long term (current) use of oral hypoglycemic drugs: Secondary | ICD-10-CM

## 2022-07-26 DIAGNOSIS — Z885 Allergy status to narcotic agent status: Secondary | ICD-10-CM | POA: Diagnosis not present

## 2022-07-26 DIAGNOSIS — D509 Iron deficiency anemia, unspecified: Secondary | ICD-10-CM | POA: Diagnosis present

## 2022-07-26 DIAGNOSIS — Z881 Allergy status to other antibiotic agents status: Secondary | ICD-10-CM

## 2022-07-26 DIAGNOSIS — K920 Hematemesis: Secondary | ICD-10-CM | POA: Diagnosis present

## 2022-07-26 DIAGNOSIS — Z888 Allergy status to other drugs, medicaments and biological substances status: Secondary | ICD-10-CM | POA: Diagnosis not present

## 2022-07-26 DIAGNOSIS — Z8249 Family history of ischemic heart disease and other diseases of the circulatory system: Secondary | ICD-10-CM

## 2022-07-26 DIAGNOSIS — G621 Alcoholic polyneuropathy: Secondary | ICD-10-CM | POA: Diagnosis present

## 2022-07-26 DIAGNOSIS — Z8711 Personal history of peptic ulcer disease: Secondary | ICD-10-CM

## 2022-07-26 DIAGNOSIS — K760 Fatty (change of) liver, not elsewhere classified: Secondary | ICD-10-CM | POA: Diagnosis not present

## 2022-07-26 DIAGNOSIS — I10 Essential (primary) hypertension: Secondary | ICD-10-CM | POA: Diagnosis present

## 2022-07-26 DIAGNOSIS — E8729 Other acidosis: Secondary | ICD-10-CM | POA: Insufficient documentation

## 2022-07-26 DIAGNOSIS — R7401 Elevation of levels of liver transaminase levels: Secondary | ICD-10-CM | POA: Diagnosis present

## 2022-07-26 DIAGNOSIS — F1090 Alcohol use, unspecified, uncomplicated: Secondary | ICD-10-CM

## 2022-07-26 DIAGNOSIS — E11649 Type 2 diabetes mellitus with hypoglycemia without coma: Principal | ICD-10-CM | POA: Diagnosis present

## 2022-07-26 DIAGNOSIS — Z91199 Patient's noncompliance with other medical treatment and regimen due to unspecified reason: Secondary | ICD-10-CM

## 2022-07-26 DIAGNOSIS — M109 Gout, unspecified: Secondary | ICD-10-CM | POA: Diagnosis not present

## 2022-07-26 LAB — COMPREHENSIVE METABOLIC PANEL
ALT: 55 U/L — ABNORMAL HIGH (ref 0–44)
AST: 93 U/L — ABNORMAL HIGH (ref 15–41)
Albumin: 3 g/dL — ABNORMAL LOW (ref 3.5–5.0)
Alkaline Phosphatase: 173 U/L — ABNORMAL HIGH (ref 38–126)
Anion gap: 16 — ABNORMAL HIGH (ref 5–15)
BUN: 8 mg/dL (ref 6–20)
CO2: 17 mmol/L — ABNORMAL LOW (ref 22–32)
Calcium: 7.8 mg/dL — ABNORMAL LOW (ref 8.9–10.3)
Chloride: 106 mmol/L (ref 98–111)
Creatinine, Ser: 0.81 mg/dL (ref 0.44–1.00)
GFR, Estimated: >60 mL/min (ref >60)
Glucose, Bld: 41 mg/dL — CL (ref 70–99)
Potassium: 2.9 mmol/L — ABNORMAL LOW (ref 3.5–5.1)
Sodium: 139 mmol/L (ref 135–145)
Total Bilirubin: 1.3 mg/dL — ABNORMAL HIGH (ref 0.3–1.2)
Total Protein: 7 g/dL (ref 6.5–8.1)

## 2022-07-26 LAB — RAPID URINE DRUG SCREEN, HOSP PERFORMED
Amphetamines: NOT DETECTED
Barbiturates: NOT DETECTED
Benzodiazepines: POSITIVE — AB
Cocaine: NOT DETECTED
Opiates: NOT DETECTED
Tetrahydrocannabinol: NOT DETECTED

## 2022-07-26 LAB — URINALYSIS, ROUTINE W REFLEX MICROSCOPIC
Bilirubin Urine: NEGATIVE
Glucose, UA: NEGATIVE mg/dL
Hgb urine dipstick: NEGATIVE
Ketones, ur: NEGATIVE mg/dL
Nitrite: NEGATIVE
Protein, ur: NEGATIVE mg/dL
Specific Gravity, Urine: 1.015 (ref 1.005–1.030)
pH: 6 (ref 5.0–8.0)

## 2022-07-26 LAB — CBC WITH DIFFERENTIAL/PLATELET
Abs Immature Granulocytes: 0.01 10*3/uL (ref 0.00–0.07)
Basophils Absolute: 0.1 10*3/uL (ref 0.0–0.1)
Basophils Relative: 1 %
Eosinophils Absolute: 0 10*3/uL (ref 0.0–0.5)
Eosinophils Relative: 0 %
HCT: 29.1 % — ABNORMAL LOW (ref 36.0–46.0)
Hemoglobin: 9.4 g/dL — ABNORMAL LOW (ref 12.0–15.0)
Immature Granulocytes: 0 %
Lymphocytes Relative: 30 %
Lymphs Abs: 2 10*3/uL (ref 0.7–4.0)
MCH: 28.9 pg (ref 26.0–34.0)
MCHC: 32.3 g/dL (ref 30.0–36.0)
MCV: 89.5 fL (ref 80.0–100.0)
Monocytes Absolute: 0.6 10*3/uL (ref 0.1–1.0)
Monocytes Relative: 9 %
Neutro Abs: 3.9 10*3/uL (ref 1.7–7.7)
Neutrophils Relative %: 60 %
Platelets: 186 10*3/uL (ref 150–400)
RBC: 3.25 MIL/uL — ABNORMAL LOW (ref 3.87–5.11)
RDW: 17.9 % — ABNORMAL HIGH (ref 11.5–15.5)
WBC: 6.6 10*3/uL (ref 4.0–10.5)
nRBC: 0 % (ref 0.0–0.2)

## 2022-07-26 LAB — LIPASE, BLOOD: Lipase: 100 U/L — ABNORMAL HIGH (ref 11–51)

## 2022-07-26 LAB — ETHANOL: Alcohol, Ethyl (B): 38 mg/dL — ABNORMAL HIGH (ref <10)

## 2022-07-26 LAB — ACETAMINOPHEN LEVEL: Acetaminophen (Tylenol), Serum: <10 ug/mL — ABNORMAL LOW (ref 10–30)

## 2022-07-26 LAB — CBG MONITORING, ED: Glucose-Capillary: 80 mg/dL (ref 70–99)

## 2022-07-26 MED ORDER — POTASSIUM CHLORIDE 10 MEQ/100ML IV SOLN
10.0000 meq | INTRAVENOUS | Status: AC
  Administered 2022-07-26 – 2022-07-27 (×3): 10 meq via INTRAVENOUS
  Filled 2022-07-26 (×3): qty 100

## 2022-07-26 MED ORDER — LORAZEPAM 2 MG/ML IJ SOLN: 1.0000 mg | INTRAMUSCULAR | Status: DC | PRN

## 2022-07-26 MED ORDER — PROCHLORPERAZINE EDISYLATE 10 MG/2ML IJ SOLN: 5.0000 mg | Freq: Four times a day (QID) | INTRAMUSCULAR | Status: DC | PRN

## 2022-07-26 MED ORDER — LORAZEPAM 2 MG/ML IJ SOLN
1.0000 mg | INTRAMUSCULAR | Status: AC | PRN
  Administered 2022-07-26 – 2022-07-27 (×3): 2 mg via INTRAVENOUS
  Administered 2022-07-27 – 2022-07-28 (×2): 1 mg via INTRAVENOUS
  Filled 2022-07-26 (×6): qty 1

## 2022-07-26 MED ORDER — FERROUS SULFATE 325 (65 FE) MG PO TABS
325.0000 mg | ORAL_TABLET | Freq: Two times a day (BID) | ORAL | Status: DC
  Administered 2022-07-27 – 2022-07-31 (×9): 325 mg via ORAL
  Filled 2022-07-26 (×9): qty 1

## 2022-07-26 MED ORDER — LORAZEPAM 1 MG PO TABS
1.0000 mg | ORAL_TABLET | ORAL | Status: AC | PRN
  Administered 2022-07-27: 2 mg via ORAL
  Administered 2022-07-29 (×2): 1 mg via ORAL
  Filled 2022-07-26: qty 1
  Filled 2022-07-26: qty 2
  Filled 2022-07-26: qty 1

## 2022-07-26 MED ORDER — ADULT MULTIVITAMIN W/MINERALS CH
1.0000 | ORAL_TABLET | Freq: Every day | ORAL | Status: DC
  Administered 2022-07-27 – 2022-07-31 (×5): 1 via ORAL
  Filled 2022-07-26 (×5): qty 1

## 2022-07-26 MED ORDER — ENOXAPARIN SODIUM 40 MG/0.4ML IJ SOSY
40.0000 mg | PREFILLED_SYRINGE | INTRAMUSCULAR | Status: DC
  Administered 2022-07-28 – 2022-07-29 (×2): 40 mg via SUBCUTANEOUS
  Filled 2022-07-26 (×5): qty 0.4

## 2022-07-26 MED ORDER — THIAMINE MONONITRATE 100 MG PO TABS
100.0000 mg | ORAL_TABLET | Freq: Every day | ORAL | Status: DC
  Administered 2022-07-27 – 2022-07-31 (×5): 100 mg via ORAL
  Filled 2022-07-26 (×5): qty 1

## 2022-07-26 MED ORDER — ALUM & MAG HYDROXIDE-SIMETH 200-200-20 MG/5ML PO SUSP
30.0000 mL | Freq: Once | ORAL | Status: AC
  Administered 2022-07-26: 30 mL via ORAL
  Filled 2022-07-26: qty 30

## 2022-07-26 MED ORDER — POLYETHYLENE GLYCOL 3350 17 G PO PACK: 17.0000 g | PACK | Freq: Every day | ORAL | Status: DC | PRN

## 2022-07-26 MED ORDER — FENTANYL CITRATE PF 50 MCG/ML IJ SOSY
25.0000 ug | PREFILLED_SYRINGE | Freq: Once | INTRAMUSCULAR | Status: AC
  Administered 2022-07-26: 25 ug via INTRAVENOUS
  Filled 2022-07-26: qty 1

## 2022-07-26 MED ORDER — POTASSIUM CHLORIDE CRYS ER 20 MEQ PO TBCR
40.0000 meq | EXTENDED_RELEASE_TABLET | Freq: Once | ORAL | Status: DC
  Filled 2022-07-26 (×2): qty 2

## 2022-07-26 MED ORDER — GLUCOSE 40 % PO GEL
1.0000 | Freq: Once | ORAL | Status: AC
  Administered 2022-07-26: 31 g via ORAL
  Filled 2022-07-26: qty 1

## 2022-07-26 MED ORDER — INSULIN ASPART 100 UNIT/ML IJ SOLN: 0.0000 [IU] | Freq: Three times a day (TID) | INTRAMUSCULAR | Status: DC

## 2022-07-26 MED ORDER — FOLIC ACID 1 MG PO TABS
1.0000 mg | ORAL_TABLET | Freq: Every day | ORAL | Status: DC
  Administered 2022-07-27 – 2022-07-31 (×5): 1 mg via ORAL
  Filled 2022-07-26 (×6): qty 1

## 2022-07-26 MED ORDER — GABAPENTIN 300 MG PO CAPS
300.0000 mg | ORAL_CAPSULE | Freq: Two times a day (BID) | ORAL | Status: DC
  Administered 2022-07-27 – 2022-07-31 (×10): 300 mg via ORAL
  Filled 2022-07-26 (×10): qty 1

## 2022-07-26 MED ORDER — ONDANSETRON HCL 4 MG/2ML IJ SOLN
4.0000 mg | Freq: Once | INTRAMUSCULAR | Status: AC
  Administered 2022-07-26: 4 mg via INTRAVENOUS
  Filled 2022-07-26: qty 2

## 2022-07-26 MED ORDER — DEXTROSE 5 % AND 0.9 % NACL IV BOLUS
1000.0000 mL | Freq: Once | INTRAVENOUS | Status: AC
  Administered 2022-07-26: 1000 mL via INTRAVENOUS

## 2022-07-26 MED ORDER — POTASSIUM CHLORIDE 2 MEQ/ML IV SOLN
INTRAVENOUS | Status: DC
  Filled 2022-07-26 (×2): qty 1000

## 2022-07-26 MED ORDER — FOLIC ACID 1 MG PO TABS
1.0000 mg | ORAL_TABLET | Freq: Every day | ORAL | Status: DC
Start: 2022-07-27 — End: 2022-07-26

## 2022-07-26 MED ORDER — THIAMINE MONONITRATE 100 MG PO TABS
100.0000 mg | ORAL_TABLET | Freq: Every day | ORAL | Status: DC
Start: 2022-07-27 — End: 2022-07-26

## 2022-07-26 MED ORDER — THIAMINE HCL 100 MG/ML IJ SOLN
100.0000 mg | Freq: Every day | INTRAMUSCULAR | Status: DC
  Filled 2022-07-26: qty 2

## 2022-07-26 MED ORDER — LEVETIRACETAM 500 MG PO TABS
500.0000 mg | ORAL_TABLET | Freq: Two times a day (BID) | ORAL | Status: DC
  Administered 2022-07-27 – 2022-07-31 (×9): 500 mg via ORAL
  Filled 2022-07-26 (×9): qty 1

## 2022-07-26 MED ORDER — ADULT MULTIVITAMIN W/MINERALS CH: 1.0000 | ORAL_TABLET | Freq: Every day | ORAL | Status: DC

## 2022-07-26 MED ORDER — PANTOPRAZOLE SODIUM 40 MG PO TBEC
40.0000 mg | DELAYED_RELEASE_TABLET | Freq: Two times a day (BID) | ORAL | Status: DC
  Administered 2022-07-27: 40 mg via ORAL
  Filled 2022-07-26: qty 1

## 2022-07-26 MED ORDER — LEVETIRACETAM 500 MG PO TABS
1000.0000 mg | ORAL_TABLET | Freq: Once | ORAL | Status: AC
  Administered 2022-07-26: 500 mg via ORAL
  Filled 2022-07-26: qty 2

## 2022-07-26 MED ORDER — LORAZEPAM 1 MG PO TABS: 1.0000 mg | ORAL_TABLET | ORAL | Status: DC | PRN

## 2022-07-26 MED ORDER — LIDOCAINE VISCOUS HCL 2 % MT SOLN
15.0000 mL | Freq: Once | OROMUCOSAL | Status: AC
  Administered 2022-07-26: 15 mL via ORAL
  Filled 2022-07-26: qty 15

## 2022-07-26 MED ORDER — THIAMINE HCL 100 MG/ML IJ SOLN
100.0000 mg | Freq: Every day | INTRAMUSCULAR | Status: DC
Start: 2022-07-27 — End: 2022-07-26

## 2022-07-26 MED ORDER — SODIUM CHLORIDE 0.9 % IV BOLUS
1000.0000 mL | Freq: Once | INTRAVENOUS | Status: AC
  Administered 2022-07-26: 1000 mL via INTRAVENOUS

## 2022-07-26 NOTE — ED Notes (Signed)
Pt provided with orange juice and graham crackers.  

## 2022-07-26 NOTE — ED Triage Notes (Signed)
Patient is here for evaluation of bilateral leg pain and naval pain. Reports that she was seen last week for the same thing. States she is unsure why she is hurting. Denies any injuries to legs.

## 2022-07-26 NOTE — ED Notes (Signed)
Patient stated that she took Levetiracetam this morning and is only taking one

## 2022-07-26 NOTE — Progress Notes (Signed)
Bilateral lower extremity venous study completed.   Preliminary results relayed to MD.  Please see CV Procedures for preliminary results.  Yoali Conry, RVT  7:32 PM 07/26/22

## 2022-07-26 NOTE — H&P (Addendum)
History and Physical  Christina Byrd UJW:[id number] DOB: 09/09/1965 DOA: 07/26/2022  Referring physician: Maryanna Shape, PA-EDP  PCP: Patient, No Pcp Per  Outpatient Specialists: None Patient coming from: Home  Chief Complaint: Legs pain, "feels like needles are poking".  HPI: Christina Byrd is a 57 y.o. 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  States it feels like needles are poking her legs on and off.  Was seen in the ED last week for the same complaint.  Denies any injuries to her legs.  Also endorses periumbilical pain.  She has a history of bleeding angiectasia in the stomach, duodenal ulcer and stopped taking all her medications in the past 2 weeks.  Has been drinking alcohol excessively until last night when she only had 2 beers.  She is trying to quit the use of alcohol completely.  States when she drinks alcohol she does not eat.  Her last meal was a week ago.  She presented to the ED for further evaluation.  In the ED, tremulous on exam, bilateral lower extremity exam was benign, Doppler ultrasound was negative for DVT.  Lab studies were notable for hypoglycemia with blood glucose of 41 elevated liver chemistries.  Right upper quadrant abdominal ultrasound revealed hepatic steatosis, cholelithiasis without evidence of cholecystitis.  Alcohol level was 38, serum potassium 2.9, serum bicarb 17, anion gap 16, albumin 3.0, alkaline phosphatase 173, AST 93, ALT 55, T. bili 1.3.  Due to concern for recurrent hypoglycemia EDP requested admission for further evaluation.  Admitted by [facility], hospitalist service.  ED Course: Temperature 99.7.  BP  159/83, pulse 115, respiration rate 15, saturation 99% on room air.  Lab studies remarkable for alcohol level was 38, serum potassium 2.9, serum bicarb 17, anion gap 16, albumin 3.0, alkaline phosphatase 173, AST 93, ALT 55, T. bili 1.3.   Review of Systems: Review of systems as noted in the HPI. All other systems reviewed and are negative.   Past Medical History:  Diagnosis Date   GASTROESOPHAGEAL REFLUX, NO ESOPHAGITIS 04/21/2006   Qualifier: Diagnosis of  By: Levada Schilling     GERD (gastroesophageal reflux disease) Dx 1995   HTN (hypertension)    Seizures (HCC)    Past Surgical History:  Procedure Laterality Date   BIOPSY  06/03/2022   Procedure: BIOPSY;  Surgeon: Jenel Lucks, MD;  Location: [facility] ENDOSCOPY;  Service: Gastroenterology;;   ESOPHAGOGASTRODUODENOSCOPY (EGD) WITH PROPOFOL N/A 06/03/2022   Procedure: ESOPHAGOGASTRODUODENOSCOPY (EGD) WITH PROPOFOL;  Surgeon: Jenel Lucks, MD;  Location: [facility] ENDOSCOPY;  Service: Gastroenterology;  Laterality: N/A;   HOT HEMOSTASIS N/A 06/03/2022   Procedure: HOT HEMOSTASIS (ARGON PLASMA COAGULATION/BICAP);  Surgeon: Jenel Lucks, MD;  Location: [facility] ENDOSCOPY;  Service: Gastroenterology;  Laterality: N/A;    Social History:  reports that she quit smoking about 10 years ago. Her smoking use included cigarettes. She has never used smokeless tobacco. She reports that she does not currently use alcohol after a past usage of about 49.0 standard drinks of alcohol per week. She reports that she does not use drugs.   Allergies  Allergen Reactions   Losartan Potassium     Rash   Levaquin [Levofloxacin In D5w] Rash  Ace Inhibitors Cough    REACTION: cough   Codeine Nausea Only   Cefepime Rash    09/04/12 pm Patient started to break out with small macules after IV Vanco infusion, then macules increased in size after starting cefepime infusion.   Chlorhexidine Itching and Rash   Vancomycin Rash    09/04/12 pm Patient started to  break out with small macules after IV Vanco infusion, then macules increased in size after starting cefepime infusion.    Family History  Problem Relation Age of Onset   CAD Mother       Prior to Admission medications   Medication Sig Start Date End Date Taking? Authorizing Provider  amLODipine (NORVASC) 10 MG tablet TAKE 1 TABLET(10 MG) BY MOUTH DAILY Patient taking differently: Take 10 mg by mouth daily. TAKE 1 TABLET(10 MG) BY MOUTH DAILY 07/13/22  Yes Rai, Ripudeep K, MD  ferrous sulfate 325 (65 FE) MG tablet Take 1 tablet (325 mg total) by mouth 2 (two) times daily with a meal. 06/05/22  Yes Leroy Sea, MD  folic acid (FOLVITE) 1 MG tablet Take 1 tablet (1 mg total) by mouth daily. 06/05/22  Yes Leroy Sea, MD  levETIRAcetam (KEPPRA) 500 MG tablet Take 1 tablet (500 mg total) by mouth 2 (two) times daily. 07/13/22 11/10/22 Yes Rai, Ripudeep K, MD  magnesium oxide (MAG-OX) 400 (240 Mg) MG tablet TAKE 1 TABLET BY MOUTH EVERY DAY Patient taking differently: Take 400 mg by mouth daily. 06/24/21  Yes Hoy Register, MD  Multiple Vitamin (MULTIVITAMIN WITH MINERALS) TABS tablet Take 1 tablet by mouth daily.   Yes [provider]  ondansetron (ZOFRAN-ODT) 4 MG disintegrating tablet Take 1 tablet (4 mg total) by mouth every 8 (eight) hours as needed for nausea or vomiting. 05/29/22  Yes Rancour, Jeannett Senior, MD  pantoprazole (PROTONIX) 40 MG tablet Take 1 tablet (40 mg total) by mouth 2 (two) times daily before a meal. Patient taking differently: Take 40 mg by mouth 2 (two) times daily. 06/05/22  Yes Leroy Sea, MD  potassium chloride SA (KLOR-CON M) 20 MEQ tablet Take 1 tablet (20 mEq total) by mouth 2 (two) times daily. 07/19/22  Yes Dione Booze, MD  allopurinol (ZYLOPRIM) 100 MG tablet Take 1 tablet (100 mg total) by mouth daily. Patient not taking: Reported on 07/26/2022 01/13/22   Grayce Sessions, NP  Blood Glucose Monitoring Suppl (TRUE METRIX METER) DEVI 1 kit by Does  not apply route 3 (three) times daily. CHECK BLOOD SUGAR UP TO 3 TIMES DAILY. E11.9 02/27/19   Hoy Register, MD  glucose blood (TRUE METRIX BLOOD GLUCOSE TEST) test strip Use as instructed 03/06/20   Hoy Register, MD  lidocaine (LIDODERM) 5 % Place 1 patch onto the skin daily. Remove & Discard patch within 12 hours or as directed by MD. Apply to R knee Patient not taking: Reported on 07/26/2022 07/13/22   Rai, Delene Ruffini, MD  thiamine (VITAMIN B-1) 100 MG tablet Take 1 tablet (100 mg total) by mouth daily. Patient not taking: Reported on 07/12/2022 07/28/20   Van Clines, MD  traMADol (ULTRAM) 50 MG tablet Take 1 tablet (50 mg total) by mouth every 12 (twelve) hours as needed for severe pain. Patient not taking: Reported on 07/22/2022 07/12/22   Cathren Harsh, MD  TRUEplus Lancets 28G MISC SMARTSIG:Topical 1 to 3 Times Daily 03/06/20   Hoy Register, MD  vitamin B-12 (CYANOCOBALAMIN) 1000 MCG tablet Take 1 tablet (1,000 mcg total) by mouth  daily. Patient not taking: Reported on 07/12/2022 07/28/20   Van Clines, MD  metFORMIN (GLUCOPHAGE) 500 MG tablet Take 0.5 tablets (250 mg total) by mouth daily with breakfast. 03/05/20 04/02/20  Anders Simmonds, PA-C    Physical Exam: BP 130/74   Pulse (!) 118   Temp 99.7 F (37.6 C)   Resp 19   Ht 5\' 2"  (1.575 m)   Wt 54.4 kg   LMP 08/23/2011   SpO2 100%   BMI 21.95 kg/m   General: 57 y.o. year-old female well developed well nourished in no acute distress.  Alert and oriented x3. Cardiovascular: Tachycardic with no rubs or gallops.  No thyromegaly or JVD noted.  No lower extremity edema. 2/4 pulses in all 4 extremities. Respiratory: Clear to auscultation with no wheezes or rales. Good inspiratory effort. Abdomen: Soft periumbilical tenderness on palpation.  Nondistended with normal bowel sounds x4 quadrants. Muskuloskeletal: No cyanosis, clubbing or edema noted bilaterally Neuro: CN II-XII intact, strength, sensation, reflexes Skin: No  ulcerative lesions noted or rashes Psychiatry: Judgement and insight appear normal. Mood is appropriate for condition and setting          Labs on Admission:  Basic Metabolic Panel: Recent Labs  Lab 07/23/22 0009 07/26/22 2025  NA 145 139  K 4.3 2.9*  CL 114* 106  CO2 11* 17*  GLUCOSE 33* 41*  BUN 18 8  CREATININE 0.92 0.81  CALCIUM 8.0* 7.8*  MG 1.5*  --    Liver Function Tests: Recent Labs  Lab 07/23/22 0009 07/26/22 2025  AST 134* 93*  ALT 71* 55*  ALKPHOS 205* 173*  BILITOT 1.2 1.3*  PROT 6.8 7.0  ALBUMIN 3.1* 3.0*   Recent Labs  Lab 07/23/22 0009 07/26/22 2025  LIPASE 104* 100*   No results for input(s): "AMMONIA" in the last 168 hours. CBC: Recent Labs  Lab 07/23/22 0009 07/26/22 2025  WBC 9.9 6.6  NEUTROABS 7.2 3.9  HGB 10.3* 9.4*  HCT 33.1* 29.1*  MCV 93.8 89.5  PLT 290 186   Cardiac Enzymes: No results for input(s): "CKTOTAL", "CKMB", "CKMBINDEX", "TROPONINI" in the last 168 hours.  BNP (last 3 results) Recent Labs    06/05/22 0249  BNP 42.9    ProBNP (last 3 results) No results for input(s): "PROBNP" in the last 8760 hours.  CBG: Recent Labs  Lab 07/23/22 0148 07/23/22 0241 07/26/22 2206  GLUCAP 76 105* 80    Radiological Exams on Admission: US Abdomen Limited RUQ (LIVER/GB)  Result Date: 07/26/2022 CLINICAL DATA:  Right upper quadrant pain EXAM: ULTRASOUND ABDOMEN LIMITED RIGHT UPPER QUADRANT COMPARISON:  Ultrasound 07/22/2022 and CT 05/29/2022 FINDINGS: Gallbladder: Mildly distended gallbladder with some layering sludge. No wall thickening or pericholecystic fluid. No sonographic Murphy's sign noted by the sonographer. Small 2 mm polyp. No follow-up recommended. Common bile duct: Diameter: 7 mm Liver: No focal lesion identified. Increased parenchymal echogenicity. Portal vein is patent on color Doppler imaging with normal direction of blood flow towards the liver. Other: None. IMPRESSION: Cholelithiasis without evidence of  cholecystitis. Hepatic steatosis. Electronically Signed   By: Minerva Fester M.D.   On: 07/26/2022 21:18   VAS [place] LOWER EXTREMITY VENOUS (DVT) (7a-7p)  Result Date: 07/26/2022  Lower Venous DVT Study Patient Name:  Christina Byrd  Date of Exam:   07/26/2022 Medical Rec #: [id number]           Accession #:    [id number] Date of Birth: [birth year]  Patient Gender: F Patient Age:   29 years Exam Location:  [facility] Procedure:      VAS [place] LOWER EXTREMITY VENOUS (DVT) Referring Phys: Maryanna Shape --------------------------------------------------------------------------------  Indications: Pain, and Swelling.  Comparison Study: No previous study. Performing Technologist: McKayla Maag RVT, VT  Examination Guidelines: A complete evaluation includes B-mode imaging, spectral Doppler, color Doppler, and power Doppler as needed of all accessible portions of each vessel. Bilateral testing is considered an integral part of a complete examination. Limited examinations for reoccurring indications may be performed as noted. The reflux portion of the exam is performed with the patient in reverse Trendelenburg.  +---------+---------------+---------+-----------+----------+--------------+ RIGHT    CompressibilityPhasicitySpontaneityPropertiesThrombus Aging +---------+---------------+---------+-----------+----------+--------------+ CFV      Full           Yes      Yes                                 +---------+---------------+---------+-----------+----------+--------------+ SFJ      Full                                                        +---------+---------------+---------+-----------+----------+--------------+ FV Prox  Full                                                        +---------+---------------+---------+-----------+----------+--------------+ FV Mid   Full                                                         +---------+---------------+---------+-----------+----------+--------------+ FV DistalFull                                                        +---------+---------------+---------+-----------+----------+--------------+ PFV      Full                                                        +---------+---------------+---------+-----------+----------+--------------+ POP      Full           Yes      Yes                                 +---------+---------------+---------+-----------+----------+--------------+ PTV      Full                                                        +---------+---------------+---------+-----------+----------+--------------+  PERO     Full                                                        +---------+---------------+---------+-----------+----------+--------------+   +---------+---------------+---------+-----------+----------+--------------+ LEFT     CompressibilityPhasicitySpontaneityPropertiesThrombus Aging +---------+---------------+---------+-----------+----------+--------------+ CFV      Full           Yes      Yes                                 +---------+---------------+---------+-----------+----------+--------------+ SFJ      Full                                                        +---------+---------------+---------+-----------+----------+--------------+ FV Prox  Full                                                        +---------+---------------+---------+-----------+----------+--------------+ FV Mid   Full                                                        +---------+---------------+---------+-----------+----------+--------------+ FV DistalFull                                                        +---------+---------------+---------+-----------+----------+--------------+ PFV      Full                                                         +---------+---------------+---------+-----------+----------+--------------+ POP      Full           Yes      Yes                                 +---------+---------------+---------+-----------+----------+--------------+ PTV      Full                                                        +---------+---------------+---------+-----------+----------+--------------+ PERO     Full                                                        +---------+---------------+---------+-----------+----------+--------------+  Summary: BILATERAL: - No evidence of deep vein thrombosis seen in the lower extremities, bilaterally. - No evidence of superficial venous thrombosis in the lower extremities, bilaterally. -No evidence of popliteal cyst, bilaterally.   *See table(s) above for measurements and observations.    Preliminary     EKG: I independently viewed the EKG done and my findings are as followed: Sinus tachycardia rate of 102.  Nonspecific ST-T changes.  QTc 464.  Assessment/Plan Present on Admission:  Hypoglycemia  Principal Problem:   Hypoglycemia  Hypoglycemia, suspect multifactorial in the setting of alcohol abuse, starvation, previous diabetic States when she drinks alcohol she does not eat, last meal was a week ago. Blood glucose on presentation 41, quickly corrected with starchy food. Hemoglobin A1c 5.0 on 06/28/22 The patient has been off hypoglycemics for the past 2 years Recommended complete alcohol cessation Continue to liberalize her diet, increase oral protein calorie intake. D5 KCl 40 mill equivalent at 50 cc/h x 1 day Repeat CBG every 2 hours x 3, if stable, then ACHS Dietitian consult  Alcohol abuse with concern for alcohol withdrawal Last alcohol intake was last night 2 beers. Tremulous on exam with concern for alcohol withdrawal Heavy drinker at baseline, drinks 9 beers and 2 shots per day. The patient is willing to completely quit alcohol use. CIWA protocol in  place Continue multivitamins, folic acid and thiamine acid replacement TOC consulted to provide resources for complete alcohol cessation The patient would benefit from referral to Alcoholic Anonymous  Elevated liver chemistries in the setting of alcohol abuse Right upper quadrant abdominal ultrasound done on 07/26/2022 revealed liver steatosis and cholelithiasis without cholecystitis. Alcohol cessation counseling done at bedside Monitor LFTs and avoid hepatotoxic agents.  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 Rule out pancreatitis, trend lipase level. If no improvement of her periumbilical pain consider GI evaluation.  Medication noncompliance The patient admits to not taking her home medications in the last 2 weeks. Counseled on the importance of medication compliance.  Recent teeth extraction The patient has requested a referral to a dentist Stating the dentist who removed her teeth will not see her TOC consulted to provide referral to follow-up with a dentist  Hepatic steatosis Recommend complete alcohol cessation  Hypokalemia Potassium on admission 2.9 Repleted orally Check magnesium level Continue D5 KCl 40 meq at 50 cc/h x 1 day Repeat BMP in the morning  High anion gap metabolic acidosis Presented with serum bicarb of 17 with anion gap of 16 Gentle IV fluid hydration Repeat BMP in the morning  Seizure disorder Resume home Keppra  GERD History of duodenal ulcer Resume home Protonix 40 mg twice daily  Iron deficiency anemia Resume home iron supplement  Elevated T bilirubin and lipase Repeat chemistry panel and lipase level in the morning to rule out biliary obstruction     DVT prophylaxis: Subcu Lovenox daily  Code Status: Full  code  Family Communication: The patient's significant other at bedside  Disposition Plan: Admitted to telemetry unit  Consults called: None.  Admission status: Observation status.   Status is: Observation    Darlin Drop MD [facility] Pager [phone]  If 7PM-7AM, please contact night-coverage [email or web address] Password [email or web address]  07/26/2022, 10:20 PM

## 2022-07-26 NOTE — ED Notes (Signed)
ED TO INPATIENT HANDOFF REPORT  Name/Age/Gender Christina Byrd 57 y.o. female  Code Status    Code Status Orders  (From admission, onward)           Start     Ordered   07/26/22 2237  Full code  Continuous       Question:  By:  Answer:  Consent: discussion documented in EHR   07/26/22 2236           Code Status History     Date Active Date Inactive Code Status Order ID Comments User Context   07/08/2022 0618 07/13/2022 1507 Full Code [id number]  Darlin Drop, DO Inpatient   06/25/2022 2029 06/29/2022 1749 Full Code [id number]  Darlin Drop, DO ED   06/03/2022 0804 06/05/2022 1758 Full Code [id number]  Emeline General, MD ED   07/08/2016 1638 07/09/2016 2136 Full Code [id number]  Ozella Rocks, MD ED   09/02/2012 0546 09/05/2012 1744 Full Code [id number]  Ron Parker, MD ED       Home/SNF/Other Home  Chief Complaint Hypoglycemia [E16.2] Coffee ground emesis [K92.0]  Level of Care/Admitting Diagnosis ED Disposition     ED Disposition  Admit   Condition  --   Comment  Hospital Area: [facility] [[id number]]  Level of Care: Progressive [102]  Admit to Progressive based on following criteria: GI, ENDOCRINE disease patients with GI bleeding, acute liver failure or pancreatitis, stable with diabetic ketoacidosis or thyrotoxicosis (hypothyroid) state.  May admit patient to [facility] or [facility] if equivalent level of care is available:: Yes  Covid Evaluation: Asymptomatic - no recent exposure (last 10 days) testing not required  Diagnosis: Coffee ground emesis [331796]  Admitting Physician: Darlin Drop [[id number]]  Attending Physician: Darlin Drop [[id number]]  Certification:: I certify this patient will need inpatient services for at least 2 midnights  Estimated Length of Stay: 2          Medical History Past Medical History:  Diagnosis Date   GASTROESOPHAGEAL REFLUX, NO ESOPHAGITIS 04/21/2006   Qualifier: Diagnosis of  By: Levada Schilling     GERD (gastroesophageal reflux disease) Dx 1995   HTN (hypertension)    Seizures (HCC)     Allergies Allergies  Allergen Reactions   Losartan Potassium     Rash   Levaquin [Levofloxacin In D5w] Rash   Ace Inhibitors Cough    REACTION: cough   Codeine Nausea Only   Cefepime Rash    09/04/12 pm Patient started to break out with small macules after IV Vanco infusion, then macules increased in size after starting cefepime infusion.   Chlorhexidine Itching and Rash   Vancomycin Rash    09/04/12 pm Patient started to break out with small macules after IV Vanco infusion, then macules increased in size after starting cefepime infusion.    IV Location/Drains/Wounds Patient Lines/Drains/Airways Status     Active Line/Drains/Airways     Name Placement date Placement time Site Days   Peripheral IV 07/26/22 20 G Anterior;Left;Proximal Forearm 07/26/22  2020  Forearm  less than 1            Labs/Imaging Results for orders placed or performed during the hospital encounter of 07/26/22 (from the past 48 hour(s))  Comprehensive metabolic panel     Status: Abnormal   Collection Time: 07/26/22  8:25 PM  Result Value Ref Range   Sodium 139 135 - 145 mmol/L   Potassium 2.9 (L) 3.5 - 5.1  mmol/L   Chloride 106 98 - 111 mmol/L   CO2 17 (L) 22 - 32 mmol/L   Glucose, Bld 41 (LL) 70 - 99 mg/dL    Comment: CRITICAL RESULT CALLED TO, READ BACK BY AND VERIFIED WITH D. ABRE, RN 07/26/22 2123 BY K. DAVIS Glucose reference range applies only to samples taken after fasting for at least 8 hours.    BUN 8 6 - 20 mg/dL   Creatinine, Ser 1.61 0.44 - 1.00 mg/dL   Calcium 7.8 (L) 8.9 - 10.3 mg/dL   Total Protein 7.0 6.5 - 8.1 g/dL   Albumin 3.0 (L) 3.5 - 5.0 g/dL   AST 93 (H) 15 - 41 U/L   ALT 55 (H) 0 - 44 U/L   Alkaline Phosphatase 173 (H) 38 - 126 U/L   Total Bilirubin 1.3 (H) 0.3 - 1.2 mg/dL   GFR, Estimated >09 >60 mL/min    Comment: (NOTE) Calculated using the CKD-EPI Creatinine  Equation (2021)    Anion gap 16 (H) 5 - 15    Comment: Performed at [facility], [address] [place], [place] [address]  CBC with Differential     Status: Abnormal   Collection Time: 07/26/22  8:25 PM  Result Value Ref Range   WBC 6.6 4.0 - 10.5 K/uL   RBC 3.25 (L) 3.87 - 5.11 MIL/uL   Hemoglobin 9.4 (L) 12.0 - 15.0 g/dL   HCT 81.1 (L) 91.4 - 78.2 %   MCV 89.5 80.0 - 100.0 fL   MCH 28.9 26.0 - 34.0 pg   MCHC 32.3 30.0 - 36.0 g/dL   RDW 95.6 (H) 21.3 - 08.6 %   Platelets 186 150 - 400 K/uL   nRBC 0.0 0.0 - 0.2 %   Neutrophils Relative % 60 %   Neutro Abs 3.9 1.7 - 7.7 K/uL   Lymphocytes Relative 30 %   Lymphs Abs 2.0 0.7 - 4.0 K/uL   Monocytes Relative 9 %   Monocytes Absolute 0.6 0.1 - 1.0 K/uL   Eosinophils Relative 0 %   Eosinophils Absolute 0.0 0.0 - 0.5 K/uL   Basophils Relative 1 %   Basophils Absolute 0.1 0.0 - 0.1 K/uL   Immature Granulocytes 0 %   Abs Immature Granulocytes 0.01 0.00 - 0.07 K/uL    Comment: Performed at [facility], [address] [place], [place] [address]  Lipase, blood     Status: Abnormal   Collection Time: 07/26/22  8:25 PM  Result Value Ref Range   Lipase 100 (H) 11 - 51 U/L    Comment: Performed at [facility], [address], [place], [place] [address]  Acetaminophen level     Status: Abnormal   Collection Time: 07/26/22  8:25 PM  Result Value Ref Range   Acetaminophen (Tylenol), Serum <10 (L) 10 - 30 ug/mL    Comment: (NOTE) Therapeutic concentrations vary significantly. A range of 10-30 ug/mL  may be an effective concentration for many patients. However, some  are best treated at concentrations outside of this range. Acetaminophen concentrations >150 ug/mL at 4 hours after ingestion  and >50 ug/mL at 12 hours after ingestion are often associated with  toxic reactions.  Performed at [facility], [address], [place], [place] [address]   Ethanol      Status: Abnormal   Collection Time: 07/26/22  8:25 PM  Result Value Ref Range   Alcohol, Ethyl (B) 38 (H) <10 mg/dL  Comment: (NOTE) Lowest detectable limit for serum alcohol is 10 mg/dL.  For medical purposes only. Performed at [facility], [address], [place], [place] [address]   Urinalysis, Routine w reflex microscopic -Urine, Clean Catch     Status: Abnormal   Collection Time: 07/26/22  8:35 PM  Result Value Ref Range   Color, Urine YELLOW YELLOW   APPearance CLEAR CLEAR   Specific Gravity, Urine 1.015 1.005 - 1.030   pH 6.0 5.0 - 8.0   Glucose, UA NEGATIVE NEGATIVE mg/dL   Hgb urine dipstick NEGATIVE NEGATIVE   Bilirubin Urine NEGATIVE NEGATIVE   Ketones, ur NEGATIVE NEGATIVE mg/dL   Protein, ur NEGATIVE NEGATIVE mg/dL   Nitrite NEGATIVE NEGATIVE   Leukocytes,Ua TRACE (A) NEGATIVE   RBC / HPF 0-5 0 - 5 RBC/hpf   WBC, UA 6-10 0 - 5 WBC/hpf   Bacteria, UA RARE (A) NONE SEEN   Squamous Epithelial / HPF 0-5 0 - 5 /HPF   Mucus PRESENT    Hyaline Casts, UA PRESENT     Comment: Performed at [facility], [address], [place], [place] [address]  Rapid urine drug screen (hospital performed)     Status: Abnormal   Collection Time: 07/26/22  8:35 PM  Result Value Ref Range   Opiates NONE DETECTED NONE DETECTED   Cocaine NONE DETECTED NONE DETECTED   Benzodiazepines POSITIVE (A) NONE DETECTED   Amphetamines NONE DETECTED NONE DETECTED   Tetrahydrocannabinol NONE DETECTED NONE DETECTED   Barbiturates NONE DETECTED NONE DETECTED    Comment: (NOTE) DRUG SCREEN FOR MEDICAL PURPOSES ONLY.  IF CONFIRMATION IS NEEDED FOR ANY PURPOSE, NOTIFY LAB WITHIN 5 DAYS.  LOWEST DETECTABLE LIMITS FOR URINE DRUG SCREEN Drug Class                     Cutoff (ng/mL) Amphetamine and metabolites    1000 Barbiturate and metabolites    200 Benzodiazepine                 200 Opiates and metabolites        300 Cocaine and metabolites        300 THC                             50 Performed at [facility], [address], [place], [place] [address]   POC CBG, ED     Status: None   Collection Time: 07/26/22 10:06 PM  Result Value Ref Range   Glucose-Capillary 80 70 - 99 mg/dL    Comment: Glucose reference range applies only to samples taken after fasting for at least 8 hours.   US Abdomen Limited RUQ (LIVER/GB)  Result Date: 07/26/2022 CLINICAL DATA:  Right upper quadrant pain EXAM: ULTRASOUND ABDOMEN LIMITED RIGHT UPPER QUADRANT COMPARISON:  Ultrasound 07/22/2022 and CT 05/29/2022 FINDINGS: Gallbladder: Mildly distended gallbladder with some layering sludge. No wall thickening or pericholecystic fluid. No sonographic Murphy's sign noted by the sonographer. Small 2 mm polyp. No follow-up recommended. Common bile duct: Diameter: 7 mm Liver: No focal lesion identified. Increased parenchymal echogenicity. Portal vein is patent on color Doppler imaging with normal direction of blood flow towards the liver. Other: None. IMPRESSION: Cholelithiasis without evidence of cholecystitis. Hepatic steatosis. Electronically Signed   By: Minerva Fester M.D.   On: 07/26/2022 21:18   VAS [place] LOWER EXTREMITY VENOUS (DVT) (7a-7p)  Result Date: 07/26/2022  Lower  Venous DVT Study Patient Name:  Christina Byrd  Date of Exam:   07/26/2022 Medical Rec #: [id number]           Accession #:    [id number] Date of Birth: 1965-07-29           Patient Gender: F Patient Age:   10 years Exam Location:  [facility] Procedure:      VAS [place] LOWER EXTREMITY VENOUS (DVT) Referring Phys: Maryanna Shape --------------------------------------------------------------------------------  Indications: Pain, and Swelling.  Comparison Study: No previous study. Performing Technologist: McKayla Maag RVT, [place]  Examination Guidelines: A complete evaluation includes B-mode imaging, spectral Doppler, color Doppler, and power Doppler as needed of all accessible portions of each  vessel. Bilateral testing is considered an integral part of a complete examination. Limited examinations for reoccurring indications may be performed as noted. The reflux portion of the exam is performed with the patient in reverse Trendelenburg.  +---------+---------------+---------+-----------+----------+--------------+ RIGHT    CompressibilityPhasicitySpontaneityPropertiesThrombus Aging +---------+---------------+---------+-----------+----------+--------------+ CFV      Full           Yes      Yes                                 +---------+---------------+---------+-----------+----------+--------------+ SFJ      Full                                                        +---------+---------------+---------+-----------+----------+--------------+ FV Prox  Full                                                        +---------+---------------+---------+-----------+----------+--------------+ FV Mid   Full                                                        +---------+---------------+---------+-----------+----------+--------------+ FV DistalFull                                                        +---------+---------------+---------+-----------+----------+--------------+ PFV      Full                                                        +---------+---------------+---------+-----------+----------+--------------+ POP      Full           Yes      Yes                                 +---------+---------------+---------+-----------+----------+--------------+ PTV      Full                                                        +---------+---------------+---------+-----------+----------+--------------+  PERO     Full                                                        +---------+---------------+---------+-----------+----------+--------------+   +---------+---------------+---------+-----------+----------+--------------+ LEFT      CompressibilityPhasicitySpontaneityPropertiesThrombus Aging +---------+---------------+---------+-----------+----------+--------------+ CFV      Full           Yes      Yes                                 +---------+---------------+---------+-----------+----------+--------------+ SFJ      Full                                                        +---------+---------------+---------+-----------+----------+--------------+ FV Prox  Full                                                        +---------+---------------+---------+-----------+----------+--------------+ FV Mid   Full                                                        +---------+---------------+---------+-----------+----------+--------------+ FV DistalFull                                                        +---------+---------------+---------+-----------+----------+--------------+ PFV      Full                                                        +---------+---------------+---------+-----------+----------+--------------+ POP      Full           Yes      Yes                                 +---------+---------------+---------+-----------+----------+--------------+ PTV      Full                                                        +---------+---------------+---------+-----------+----------+--------------+ PERO     Full                                                        +---------+---------------+---------+-----------+----------+--------------+  Summary: BILATERAL: - No evidence of deep vein thrombosis seen in the lower extremities, bilaterally. - No evidence of superficial venous thrombosis in the lower extremities, bilaterally. -No evidence of popliteal cyst, bilaterally.   *See table(s) above for measurements and observations.    Preliminary     Pending Labs Unresulted Labs (From admission, onward)     Start     Ordered   08/02/22 0500  Creatinine, serum   (enoxaparin (LOVENOX)    CrCl >/= 30 ml/min)  Weekly,   R     Comments: while on enoxaparin therapy    07/26/22 2236   07/26/22 2217  CK  Once,   URGENT        07/26/22 2216   07/26/22 2212  Magnesium  Once,   STAT        07/26/22 2212            Vitals/Pain Today's Vitals   07/26/22 2219 07/26/22 2300 07/26/22 2311 07/26/22 2312  BP: (!) 159/83 (!) 123/97    Pulse: (!) 115 (!) 134 (!) 127 (!) 124  Resp:  (!) 31 (!) 26 18  Temp:      TempSrc:      SpO2:  100% 100% 100%  Weight:      Height:      PainSc:        Isolation Precautions No active isolations  Medications Medications  LORazepam (ATIVAN) tablet 1-4 mg ( Oral See Alternative 07/26/22 2327)    Or  LORazepam (ATIVAN) injection 1-4 mg (2 mg Intravenous Given 07/26/22 2327)  thiamine (VITAMIN B1) tablet 100 mg (has no administration in time range)    Or  thiamine (VITAMIN B1) injection 100 mg (has no administration in time range)  folic acid (FOLVITE) tablet 1 mg (has no administration in time range)  multivitamin with minerals tablet 1 tablet (has no administration in time range)  potassium chloride SA (KLOR-CON M) CR tablet 40 mEq (40 mEq Oral Patient Refused/Not Given 07/26/22 2253)  potassium chloride 10 mEq in 100 mL IVPB (10 mEq Intravenous New Bag/Given 07/26/22 2325)  ferrous sulfate tablet 325 mg (has no administration in time range)  levETIRAcetam (KEPPRA) tablet 500 mg (has no administration in time range)  pantoprazole (PROTONIX) EC tablet 40 mg (has no administration in time range)  enoxaparin (LOVENOX) injection 40 mg (has no administration in time range)  dextrose 5 % 1,000 mL with potassium chloride 40 mEq infusion ( Intravenous New Bag/Given 07/26/22 2328)  prochlorperazine (COMPAZINE) injection 5 mg (has no administration in time range)  polyethylene glycol (MIRALAX / GLYCOLAX) packet 17 g (has no administration in time range)  gabapentin (NEURONTIN) capsule 300 mg (has no administration in time range)   sodium chloride 0.9 % bolus 1,000 mL (0 mLs Intravenous Stopped 07/26/22 2216)  alum & mag hydroxide-simeth (MAALOX/MYLANTA) 200-200-20 MG/5ML suspension 30 mL (30 mLs Oral Given 07/26/22 2059)    And  lidocaine (XYLOCAINE) 2 % viscous mouth solution 15 mL (15 mLs Oral Given 07/26/22 2059)  dextrose (GLUTOSE) oral gel 40% (31 g Oral Given 07/26/22 2134)  fentaNYL (SUBLIMAZE) injection 25 mcg (25 mcg Intravenous Given 07/26/22 2142)  ondansetron (ZOFRAN) injection 4 mg (4 mg Intravenous Given 07/26/22 2203)  dextrose 5 % and 0.9% NaCl 5-0.9 % bolus 1,000 mL (0 mLs Intravenous Stopped 07/26/22 2327)  levETIRAcetam (KEPPRA) tablet 1,000 mg (500 mg Oral Given 07/26/22 2250)    Mobility walks with person assist

## 2022-07-26 NOTE — ED Provider Notes (Signed)
[place] EMERGENCY DEPARTMENT AT [facility] Provider Note   CSN: [id number] Arrival date & time: 07/26/22  1749     History Chief Complaint  Patient presents with   Leg Pain    Christina Byrd is a 57 y.o. female. Patient presents to the ED with concerns of leg pain. She reports feeling pain all over. Has been seen several times recently with similar concerns as well as epigastric pain. Has been evaluated without any acute findings noted. Of note, patient does have history of GERD, HTN, seizure disorder, type 2 diabetes and alcohol use disorders. Denies any recent episodes of vomiting, diarrhea, or abdominal bloating.  Spoke with nursing staff regarding patient condition as she has not been noted to be homeless, but appears somewhat disheveled, however spouse was with her earlier. Unclear if patient is having difficulties from a social standpoint with food or medication access.   Leg Pain      Home Medications Prior to Admission medications   Medication Sig Start Date End Date Taking? Authorizing Provider  amLODipine (NORVASC) 10 MG tablet TAKE 1 TABLET(10 MG) BY MOUTH DAILY Patient taking differently: Take 10 mg by mouth daily. TAKE 1 TABLET(10 MG) BY MOUTH DAILY 07/13/22  Yes Rai, Ripudeep K, MD  ferrous sulfate 325 (65 FE) MG tablet Take 1 tablet (325 mg total) by mouth 2 (two) times daily with a meal. 06/05/22  Yes Leroy Sea, MD  folic acid (FOLVITE) 1 MG tablet Take 1 tablet (1 mg total) by mouth daily. 06/05/22  Yes Leroy Sea, MD  levETIRAcetam (KEPPRA) 500 MG tablet Take 1 tablet (500 mg total) by mouth 2 (two) times daily. 07/13/22 11/10/22 Yes Rai, Ripudeep K, MD  magnesium oxide (MAG-OX) 400 (240 Mg) MG tablet TAKE 1 TABLET BY MOUTH EVERY DAY Patient taking differently: Take 400 mg by mouth daily. 06/24/21  Yes Hoy Register, MD  Multiple Vitamin (MULTIVITAMIN WITH MINERALS) TABS tablet Take 1 tablet by mouth daily.   Yes [provider]   ondansetron (ZOFRAN-ODT) 4 MG disintegrating tablet Take 1 tablet (4 mg total) by mouth every 8 (eight) hours as needed for nausea or vomiting. 05/29/22  Yes Rancour, Jeannett Senior, MD  pantoprazole (PROTONIX) 40 MG tablet Take 1 tablet (40 mg total) by mouth 2 (two) times daily before a meal. Patient taking differently: Take 40 mg by mouth 2 (two) times daily. 06/05/22  Yes Leroy Sea, MD  potassium chloride SA (KLOR-CON M) 20 MEQ tablet Take 1 tablet (20 mEq total) by mouth 2 (two) times daily. 07/19/22  Yes Dione Booze, MD  allopurinol (ZYLOPRIM) 100 MG tablet Take 1 tablet (100 mg total) by mouth daily. Patient not taking: Reported on 07/26/2022 01/13/22   Grayce Sessions, NP  Blood Glucose Monitoring Suppl (TRUE METRIX METER) DEVI 1 kit by Does not apply route 3 (three) times daily. CHECK BLOOD SUGAR UP TO 3 TIMES DAILY. E11.9 02/27/19   Hoy Register, MD  glucose blood (TRUE METRIX BLOOD GLUCOSE TEST) test strip Use as instructed 03/06/20   Hoy Register, MD  lidocaine (LIDODERM) 5 % Place 1 patch onto the skin daily. Remove & Discard patch within 12 hours or as directed by MD. Apply to R knee Patient not taking: Reported on 07/26/2022 07/13/22   Rai, Delene Ruffini, MD  thiamine (VITAMIN B-1) 100 MG tablet Take 1 tablet (100 mg total) by mouth daily. Patient not taking: Reported on 07/12/2022 07/28/20   Van Clines, MD  traMADol Janean Sark)  50 MG tablet Take 1 tablet (50 mg total) by mouth every 12 (twelve) hours as needed for severe pain. Patient not taking: Reported on 07/22/2022 07/12/22   Cathren Harsh, MD  TRUEplus Lancets 28G MISC SMARTSIG:Topical 1 to 3 Times Daily 03/06/20   Hoy Register, MD  vitamin B-12 (CYANOCOBALAMIN) 1000 MCG tablet Take 1 tablet (1,000 mcg total) by mouth daily. Patient not taking: Reported on 07/12/2022 07/28/20   Van Clines, MD  metFORMIN (GLUCOPHAGE) 500 MG tablet Take 0.5 tablets (250 mg total) by mouth daily with breakfast. 03/05/20 04/02/20  Anders Simmonds,  PA-C      Allergies    Losartan potassium, Levaquin [levofloxacin in d5w], Ace inhibitors, Codeine, Cefepime, Chlorhexidine, and Vancomycin    Review of Systems   Review of Systems  Gastrointestinal:  Positive for abdominal pain and vomiting.  Musculoskeletal:        Leg pain  All other systems reviewed and are negative.   Physical Exam Updated Vital Signs BP (!) 159/83   Pulse (!) 115   Temp 99.7 F (37.6 C)   Resp 19   Ht 5\' 2"  (1.575 m)   Wt 54.4 kg   LMP 08/23/2011   SpO2 100%   BMI 21.95 kg/m  Physical Exam Vitals and nursing note reviewed.  Constitutional:      General: She is not in acute distress.    Appearance: She is well-developed.  HENT:     Head: Normocephalic and atraumatic.  Eyes:     Conjunctiva/sclera: Conjunctivae normal.  Cardiovascular:     Rate and Rhythm: Normal rate and regular rhythm.     Heart sounds: No murmur heard. Pulmonary:     Effort: Pulmonary effort is normal. No respiratory distress.     Breath sounds: Normal breath sounds.  Abdominal:     Palpations: Abdomen is soft.     Tenderness: There is no abdominal tenderness.  Musculoskeletal:        General: No swelling.     Cervical back: Neck supple.  Skin:    General: Skin is warm and dry.     Capillary Refill: Capillary refill takes less than 2 seconds.  Neurological:     Mental Status: She is alert.  Psychiatric:        Mood and Affect: Mood normal.     ED Results / Procedures / Treatments   Labs (all labs ordered are listed, but only abnormal results are displayed) Labs Reviewed  COMPREHENSIVE METABOLIC PANEL - Abnormal; Notable for the following components:      Result Value   Potassium 2.9 (*)    CO2 17 (*)    Glucose, Bld 41 (*)    Calcium 7.8 (*)    Albumin 3.0 (*)    AST 93 (*)    ALT 55 (*)    Alkaline Phosphatase 173 (*)    Total Bilirubin 1.3 (*)    Anion gap 16 (*)    All other components within normal limits  CBC WITH DIFFERENTIAL/PLATELET - Abnormal;  Notable for the following components:   RBC 3.25 (*)    Hemoglobin 9.4 (*)    HCT 29.1 (*)    RDW 17.9 (*)    All other components within normal limits  LIPASE, BLOOD - Abnormal; Notable for the following components:   Lipase 100 (*)    All other components within normal limits  ACETAMINOPHEN LEVEL - Abnormal; Notable for the following components:   Acetaminophen (Tylenol), Serum <10 (*)  All other components within normal limits  ETHANOL - Abnormal; Notable for the following components:   Alcohol, Ethyl (B) 38 (*)    All other components within normal limits  URINALYSIS, ROUTINE W REFLEX MICROSCOPIC  RAPID URINE DRUG SCREEN, HOSP PERFORMED  MAGNESIUM  CK  CBG MONITORING, ED  CBG MONITORING, ED    EKG EKG Interpretation  Date/Time:  Monday July 26 2022 18:14:14 EDT Ventricular Rate:  102 PR Interval:  164 QRS Duration: 70 QT Interval:  356 QTC Calculation: 464 R Axis:   55 Text Interpretation: Sinus tachycardia Anteroseptal infarct, age indeterminate Confirmed by Alvester Chou [phone]) on 07/26/2022 6:35:10 PM  Radiology US Abdomen Limited RUQ (LIVER/GB)  Result Date: 07/26/2022 CLINICAL DATA:  Right upper quadrant pain EXAM: ULTRASOUND ABDOMEN LIMITED RIGHT UPPER QUADRANT COMPARISON:  Ultrasound 07/22/2022 and CT 05/29/2022 FINDINGS: Gallbladder: Mildly distended gallbladder with some layering sludge. No wall thickening or pericholecystic fluid. No sonographic Murphy's sign noted by the sonographer. Small 2 mm polyp. No follow-up recommended. Common bile duct: Diameter: 7 mm Liver: No focal lesion identified. Increased parenchymal echogenicity. Portal vein is patent on color Doppler imaging with normal direction of blood flow towards the liver. Other: None. IMPRESSION: Cholelithiasis without evidence of cholecystitis. Hepatic steatosis. Electronically Signed   By: Minerva Fester M.D.   On: 07/26/2022 21:18   VAS [place] LOWER EXTREMITY VENOUS (DVT) (7a-7p)  Result Date:  07/26/2022  Lower Venous DVT Study Patient Name:  MAYBREE GROETSCH Shadduck  Date of Exam:   07/26/2022 Medical Rec #: [id number]           Accession #:    [id number] Date of Birth: 1965/10/09           Patient Gender: F Patient Age:   25 years Exam Location:  [facility] Procedure:      VAS [place] LOWER EXTREMITY VENOUS (DVT) Referring Phys: Maryanna Shape --------------------------------------------------------------------------------  Indications: Pain, and Swelling.  Comparison Study: No previous study. Performing Technologist: McKayla Maag RVT, [place]  Examination Guidelines: A complete evaluation includes B-mode imaging, spectral Doppler, color Doppler, and power Doppler as needed of all accessible portions of each vessel. Bilateral testing is considered an integral part of a complete examination. Limited examinations for reoccurring indications may be performed as noted. The reflux portion of the exam is performed with the patient in reverse Trendelenburg.  +---------+---------------+---------+-----------+----------+--------------+ RIGHT    CompressibilityPhasicitySpontaneityPropertiesThrombus Aging +---------+---------------+---------+-----------+----------+--------------+ CFV      Full           Yes      Yes                                 +---------+---------------+---------+-----------+----------+--------------+ SFJ      Full                                                        +---------+---------------+---------+-----------+----------+--------------+ FV Prox  Full                                                        +---------+---------------+---------+-----------+----------+--------------+ FV Mid   Full                                                        +---------+---------------+---------+-----------+----------+--------------+  FV DistalFull                                                         +---------+---------------+---------+-----------+----------+--------------+ PFV      Full                                                        +---------+---------------+---------+-----------+----------+--------------+ POP      Full           Yes      Yes                                 +---------+---------------+---------+-----------+----------+--------------+ PTV      Full                                                        +---------+---------------+---------+-----------+----------+--------------+ PERO     Full                                                        +---------+---------------+---------+-----------+----------+--------------+   +---------+---------------+---------+-----------+----------+--------------+ LEFT     CompressibilityPhasicitySpontaneityPropertiesThrombus Aging +---------+---------------+---------+-----------+----------+--------------+ CFV      Full           Yes      Yes                                 +---------+---------------+---------+-----------+----------+--------------+ SFJ      Full                                                        +---------+---------------+---------+-----------+----------+--------------+ FV Prox  Full                                                        +---------+---------------+---------+-----------+----------+--------------+ FV Mid   Full                                                        +---------+---------------+---------+-----------+----------+--------------+ FV DistalFull                                                        +---------+---------------+---------+-----------+----------+--------------+  PFV      Full                                                        +---------+---------------+---------+-----------+----------+--------------+ POP      Full           Yes      Yes                                  +---------+---------------+---------+-----------+----------+--------------+ PTV      Full                                                        +---------+---------------+---------+-----------+----------+--------------+ PERO     Full                                                        +---------+---------------+---------+-----------+----------+--------------+     Summary: BILATERAL: - No evidence of deep vein thrombosis seen in the lower extremities, bilaterally. - No evidence of superficial venous thrombosis in the lower extremities, bilaterally. -No evidence of popliteal cyst, bilaterally.   *See table(s) above for measurements and observations.    Preliminary     Procedures .Critical Care  Performed by: Smitty Knudsen, PA-C Authorized by: Smitty Knudsen, PA-C   Critical care provider statement:    Critical care time (minutes):  55   Critical care start time:  07/26/2022 9:30 PM   Critical care end time:  07/26/2022 10:25 PM   Critical care was necessary to treat or prevent imminent or life-threatening deterioration of the following conditions:  Metabolic crisis   Critical care was time spent personally by me on the following activities:  Development of treatment plan with patient or surrogate, discussions with consultants, interpretation of cardiac output measurements, ordering and review of laboratory studies and ordering and performing treatments and interventions   I assumed direction of critical care for this patient from another provider in my specialty: no     Care discussed with: admitting provider      Medications Ordered in ED Medications  LORazepam (ATIVAN) tablet 1-4 mg (has no administration in time range)    Or  LORazepam (ATIVAN) injection 1-4 mg (has no administration in time range)  thiamine (VITAMIN B1) tablet 100 mg (has no administration in time range)    Or  thiamine (VITAMIN B1) injection 100 mg (has no administration in time range)  folic acid  (FOLVITE) tablet 1 mg (has no administration in time range)  multivitamin with minerals tablet 1 tablet (has no administration in time range)  levETIRAcetam (KEPPRA) tablet 1,000 mg (has no administration in time range)  potassium chloride SA (KLOR-CON M) CR tablet 40 mEq (has no administration in time range)  potassium chloride 10 mEq in 100 mL IVPB (has no administration in time range)  sodium chloride 0.9 % bolus 1,000 mL (0 mLs Intravenous Stopped 07/26/22 2216)  alum & mag hydroxide-simeth (  MAALOX/MYLANTA) 200-200-20 MG/5ML suspension 30 mL (30 mLs Oral Given 07/26/22 2059)    And  lidocaine (XYLOCAINE) 2 % viscous mouth solution 15 mL (15 mLs Oral Given 07/26/22 2059)  dextrose (GLUTOSE) oral gel 40% (31 g Oral Given 07/26/22 2134)  fentaNYL (SUBLIMAZE) injection 25 mcg (25 mcg Intravenous Given 07/26/22 2142)  ondansetron (ZOFRAN) injection 4 mg (4 mg Intravenous Given 07/26/22 2203)  dextrose 5 % and 0.9% NaCl 5-0.9 % bolus 1,000 mL (1,000 mLs Intravenous New Bag/Given 07/26/22 2213)    ED Course/ Medical Decision Making/ A&P                           Medical Decision Making  This patient presents to the ED for concern of leg pain. Differential diagnosis includes DVT, ischemic limb, laceration, leg fracture   Lab Tests:  I Ordered, and personally interpreted labs.  The pertinent results include:  CBC with notable anemia appears at baseline, CMP with notable with hypoglycemia similar last ED visit, lipase elevated, negative Tylenol, pending UDS, ethanol, and UA   Imaging Studies ordered:  I ordered imaging studies including [place] lower extremity, [place] RUQ  I independently visualized and interpreted imaging which showed no evidence of DVT, cholelithiasis without evidence of cholecystitis I agree with the radiologist interpretation   Medicines ordered and prescription drug management:  I ordered medication including fluids, Maalox, dextrose, fentanyl, zofran  for dehydration, epigastric  tenderness, hypoglycemia, pain, nausea  Reevaluation of the patient after these medicines showed that the patient improved I have reviewed the patients home medicines and have made adjustments as needed   Problem List / ED Course:  Patient presented to the ED for leg pain and epigastric pain. She was seen in the ED for similar concerns on 05/31 and 05/27. States that she has been having lower extremity pain off and on without specific trigger. States that she has recently cut down on alcohol use by switching from hard liquor to beer recently, but cannot quantify use well. She is slightly tremulous on examination but unclear if this is in relation to alcohol use or other cause. While walking past patient's room, was advised that patient's glucose was 41 and was provided juice and crackers to eat. Given that patient had profound hypoglycemic episode a few days ago while in the ED with recurrence today, concerned that patient is poorly managing glucose at home and may be accidentally overdosing DM medications. She is not clear on what her DM regimen is at this time, but her chart does not show a particular regiment or insulin-dependence and metformin appears discontinued. Given recurrence of notable hypoglycemia, will plan on admission. Placed patient onto dextrose 5% bolus for continued glucose support and will treat sugars as needed. Will also perform potassium repletion and redose patient's Keppra. Given that patient's last drink was about 24 hours ago, increased risk of decline in alcohol withdrawal and DT. Thiamine added on as well as lorazepam if needed for alcohol withdrawal induced seizures. Patient agreeable with plan for admission at this time. Spoke with Dr. Dow Adolph, hospitalist, and will plan on admission of patient given recurrence of significant hypoglycemia. Unclear cause of abdominal pain or leg pain at this time as [place] or legs and RUQ reassuring at this time.   Final Clinical  Impression(s) / ED Diagnoses Final diagnoses:  Hypoglycemia  Hypokalemia  Alcohol use disorder    Rx / DC Orders ED Discharge Orders  None         Salomon Mast 07/26/22 2228    Terald Sleeper, MD 07/26/22 2325

## 2022-07-27 DIAGNOSIS — Z91199 Patient's noncompliance with other medical treatment and regimen due to unspecified reason: Secondary | ICD-10-CM

## 2022-07-27 DIAGNOSIS — E8729 Other acidosis: Secondary | ICD-10-CM | POA: Insufficient documentation

## 2022-07-27 DIAGNOSIS — K760 Fatty (change of) liver, not elsewhere classified: Secondary | ICD-10-CM | POA: Insufficient documentation

## 2022-07-27 DIAGNOSIS — E162 Hypoglycemia, unspecified: Secondary | ICD-10-CM | POA: Diagnosis not present

## 2022-07-27 DIAGNOSIS — E876 Hypokalemia: Secondary | ICD-10-CM | POA: Diagnosis not present

## 2022-07-27 DIAGNOSIS — F1090 Alcohol use, unspecified, uncomplicated: Secondary | ICD-10-CM | POA: Diagnosis not present

## 2022-07-27 LAB — COMPREHENSIVE METABOLIC PANEL
ALT: 47 U/L — ABNORMAL HIGH (ref 0–44)
AST: 77 U/L — ABNORMAL HIGH (ref 15–41)
Albumin: 2.7 g/dL — ABNORMAL LOW (ref 3.5–5.0)
Alkaline Phosphatase: 175 U/L — ABNORMAL HIGH (ref 38–126)
Anion gap: 8 (ref 5–15)
BUN: 7 mg/dL (ref 6–20)
CO2: 21 mmol/L — ABNORMAL LOW (ref 22–32)
Calcium: 7.4 mg/dL — ABNORMAL LOW (ref 8.9–10.3)
Chloride: 108 mmol/L (ref 98–111)
Creatinine, Ser: 0.91 mg/dL (ref 0.44–1.00)
GFR, Estimated: >60 mL/min (ref >60)
Glucose, Bld: 107 mg/dL — ABNORMAL HIGH (ref 70–99)
Potassium: 4.2 mmol/L (ref 3.5–5.1)
Sodium: 137 mmol/L (ref 135–145)
Total Bilirubin: 1.3 mg/dL — ABNORMAL HIGH (ref 0.3–1.2)
Total Protein: 6.1 g/dL — ABNORMAL LOW (ref 6.5–8.1)

## 2022-07-27 LAB — MAGNESIUM: Magnesium: 1.3 mg/dL — ABNORMAL LOW (ref 1.7–2.4)

## 2022-07-27 LAB — RENAL FUNCTION PANEL
Albumin: 2.6 g/dL — ABNORMAL LOW (ref 3.5–5.0)
Anion gap: 6 (ref 5–15)
BUN: 7 mg/dL (ref 6–20)
CO2: 22 mmol/L (ref 22–32)
Calcium: 7.5 mg/dL — ABNORMAL LOW (ref 8.9–10.3)
Chloride: 109 mmol/L (ref 98–111)
Creatinine, Ser: 0.85 mg/dL (ref 0.44–1.00)
GFR, Estimated: >60 mL/min (ref >60)
Glucose, Bld: 97 mg/dL (ref 70–99)
Phosphorus: 1.6 mg/dL — ABNORMAL LOW (ref 2.5–4.6)
Potassium: 3.9 mmol/L (ref 3.5–5.1)
Sodium: 137 mmol/L (ref 135–145)

## 2022-07-27 LAB — GLUCOSE, CAPILLARY
Glucose-Capillary: 143 mg/dL — ABNORMAL HIGH (ref 70–99)
Glucose-Capillary: 111 mg/dL — ABNORMAL HIGH (ref 70–99)
Glucose-Capillary: 99 mg/dL (ref 70–99)
Glucose-Capillary: 99 mg/dL (ref 70–99)
Glucose-Capillary: 98 mg/dL (ref 70–99)
Glucose-Capillary: 97 mg/dL (ref 70–99)

## 2022-07-27 LAB — CBC
HCT: 26.2 % — ABNORMAL LOW (ref 36.0–46.0)
Hemoglobin: 8.4 g/dL — ABNORMAL LOW (ref 12.0–15.0)
MCH: 29.6 pg (ref 26.0–34.0)
MCHC: 32.1 g/dL (ref 30.0–36.0)
MCV: 92.3 fL (ref 80.0–100.0)
Platelets: 151 10*3/uL (ref 150–400)
RBC: 2.84 MIL/uL — ABNORMAL LOW (ref 3.87–5.11)
RDW: 18 % — ABNORMAL HIGH (ref 11.5–15.5)
WBC: 6.5 10*3/uL (ref 4.0–10.5)
nRBC: 0 % (ref 0.0–0.2)

## 2022-07-27 LAB — PHOSPHORUS: Phosphorus: 1.5 mg/dL — ABNORMAL LOW (ref 2.5–4.6)

## 2022-07-27 MED ORDER — ENSURE ENLIVE PO LIQD
237.0000 mL | Freq: Two times a day (BID) | ORAL | Status: DC
  Administered 2022-07-28 (×2): 237 mL via ORAL

## 2022-07-27 MED ORDER — K PHOS MONO-SOD PHOS DI & MONO 155-852-130 MG PO TABS
500.0000 mg | ORAL_TABLET | Freq: Three times a day (TID) | ORAL | Status: AC
  Administered 2022-07-27 (×3): 500 mg via ORAL
  Filled 2022-07-27 (×3): qty 2

## 2022-07-27 MED ORDER — PANTOPRAZOLE SODIUM 40 MG PO TBEC
40.0000 mg | DELAYED_RELEASE_TABLET | Freq: Two times a day (BID) | ORAL | Status: DC
  Administered 2022-07-27 – 2022-07-31 (×8): 40 mg via ORAL
  Filled 2022-07-27 (×8): qty 1

## 2022-07-27 MED ORDER — MAGNESIUM SULFATE 2 GM/50ML IV SOLN
2.0000 g | Freq: Once | INTRAVENOUS | Status: AC
  Administered 2022-07-27: 2 g via INTRAVENOUS
  Filled 2022-07-27: qty 50

## 2022-07-27 MED ORDER — DEXTROSE-SODIUM CHLORIDE 5-0.45 % IV SOLN: INTRAVENOUS | Status: AC

## 2022-07-27 MED ORDER — IBUPROFEN 200 MG PO TABS
400.0000 mg | ORAL_TABLET | Freq: Four times a day (QID) | ORAL | Status: DC | PRN
  Administered 2022-07-27 – 2022-07-29 (×6): 400 mg via ORAL
  Filled 2022-07-27 (×6): qty 2

## 2022-07-27 NOTE — Progress Notes (Signed)
Initial Nutrition Assessment  DOCUMENTATION CODES:   Not applicable  INTERVENTION:  - DYS 3 diet per MD.  - Ensure Plus High Protein po BID, each supplement provides 350 kcal and 20 grams of protein. - Continue Multivitamin with minerals daily, folic acid, and thiamine for alcohol abuse.  - Monitor weight trends.   NUTRITION DIAGNOSIS:   Increased nutrient needs related to acute illness as evidenced by estimated needs.  GOAL:   Patient will meet greater than or equal to 90% of their needs  MONITOR:   PO intake, Supplement acceptance, Weight trends, Labs  REASON FOR ASSESSMENT:   Consult Assessment of nutrition requirement/status  ASSESSMENT:   57 y.o. 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 Admitted for hypoglycemia.    Attempted to visit with patient x2 today but first visit patient working with RN and second visit patient asleep and did not awake to sound of voice.   Per EMR, patient weighed at 149# in August and dropped to 124# by March - a 25# or 16.7% weight loss in 8 months, which is significant for the time frame. However, weight appears stable since that time.  Patient documented to have had recent teeth extractions, diet downgraded to DYS 3 early this morning. She is recorded to have had 100% of something at 0100 today.  Will order nutrition supplements and monitor intake.   Medications reviewed and include: 325mg  ferrous sulfate, folic acid, MIV, thiamine  Labs reviewed:  Phosphorus 1.5 Magnesium 1.3   NUTRITION - FOCUSED PHYSICAL EXAM:  Unable to complete at this time  Diet Order:   Diet Order             DIET DYS 3 Room service appropriate? Yes; Fluid consistency: Thin  Diet effective now                   EDUCATION NEEDS:  Not appropriate for  education at this time  Skin:  Skin Assessment: Reviewed RN Assessment  Last BM:  6/4  Height:  Ht Readings from Last 1 Encounters:  07/27/22 5\' 2"  (1.575 m)   Weight:  Wt Readings from Last 1 Encounters:  07/27/22 56.9 kg    BMI:  Body mass index is 22.94 kg/m.  Estimated Nutritional Needs:  Kcal:  1700-1900 kcals Protein:  65-75 grams Fluid:  >/= 1.7L    Shelle Iron RD, LDN For contact information, refer to [facility].

## 2022-07-27 NOTE — Progress Notes (Signed)
[facility] PROGRESS NOTE    Progress Note  [name]  GMW:[id number] DOB: August 23, 1965 DOA: 07/26/2022 PCP: Patient, No Pcp Per     Brief Narrative:   Christina Byrd is an 57 y.o. 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, prior history of diabetes mellitus type 2 (no longer on oral hypoglycemic agents for about 2 years) essential hypertension seizure disorder comes into the [facility] complaining of bilateral lower extremity pain, he is also been drinking alcohol excessively until last night he only had 2 beers (usually drinks 9 beers and 2 shots of liquor a day) in the ED was also found tremulous lower extremity Doppler negative for DVT was found to be hypoglycemic abdominal trismus [place] steatosis alcohol level 38 potassium 2.9 bicarb 17   Assessment/Plan:   Hypoglycemia with a history of diabetes mellitus type 2 suspect alcohol abuse and starvation ketosis/high anion gap metabolic acidosis: Glucose 41 on admission quickly corrected with starchy food. Hemoglobin A1c of 5.0.  He has been off oral hypoglycemic agents for almost 2 years. Blood glucose has been stable overnight greater than 80. Continue D5 until tonight.  Alcohol abuse: Concerned about alcohol withdrawal. He was tremulous in the ED. Started on thiamine and folate. He was also started on Ativan protocol. CIWA scores anywhere from 6-12. Continue Ativan.  Elevated LFTs in setting of alcohol use: Ultrasound showed hepatic steatosis. He has been counseled.  LFTs are trending down slowly.  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  Periumbilical pain/he has a history of duodenal ulcer: Currently on PPI twice a day.  Now resolved Slight drop in hemoglobin likely due to hemoconcentration. Denies any signs of overt bleeding. Continue IV fluids.  Hypomagnesemia: Replete  orally. Recheck tomorrow morning.  Medication noncompliance. Noted.  Hypokalemia: Replete orally now resolved.  Iron deficiency anemia: Continue iron supplementation.  DVT prophylaxis: lovenox Family Communication:none Status is: Inpatient Remains inpatient appropriate because: Alcohol abuse    Code Status:     Code Status Orders  (From admission, onward)           Start     Ordered   07/26/22 2237  Full code  Continuous       Question:  By:  Answer:  Consent: discussion documented in EHR   07/26/22 2236           Code Status History     Date Active Date Inactive Code Status Order ID Comments User Context   07/08/2022 0618 07/13/2022 1507 Full Code [id number]  Darlin Drop, DO Inpatient   06/25/2022 2029 06/29/2022 1749 Full Code [id number]  Darlin Drop, DO ED   06/03/2022 0804 06/05/2022 1758 Full Code [id number]  Emeline General, MD ED   07/08/2016 1638 07/09/2016 2136 Full Code [id number]  Ozella Rocks, MD ED   09/02/2012 0546 09/05/2012 1744 Full Code [id number]  Ron Parker, MD ED         IV Access:   Peripheral IV   Procedures and diagnostic studies:   US Abdomen Limited RUQ (LIVER/GB)  Result Date: 07/26/2022 CLINICAL DATA:  Right upper quadrant pain EXAM: ULTRASOUND ABDOMEN LIMITED RIGHT UPPER QUADRANT COMPARISON:  Ultrasound 07/22/2022 and CT 05/29/2022 FINDINGS: Gallbladder: Mildly distended gallbladder with some layering sludge. No wall thickening or pericholecystic fluid. No sonographic Murphy's sign noted by the sonographer. Small 2 mm polyp. No follow-up recommended. Common bile duct: Diameter: 7  mm Liver: No focal lesion identified. Increased parenchymal echogenicity. Portal vein is patent on color Doppler imaging with normal direction of blood flow towards the liver. Other: None. IMPRESSION: Cholelithiasis without evidence of cholecystitis. Hepatic steatosis. Electronically Signed   By: Minerva Fester M.D.   On: 07/26/2022 21:18   VAS [place]  LOWER EXTREMITY VENOUS (DVT) (7a-7p)  Result Date: 07/26/2022  Lower Venous DVT Study Patient Name:  Christina Byrd  Date of Exam:   07/26/2022 Medical Rec #: [id number]           Accession #:    [id number] Date of Birth: 11-10-1965           Patient Gender: F Patient Age:   69 years Exam Location:  [facility] Procedure:      VAS [place] LOWER EXTREMITY VENOUS (DVT) Referring Phys: Maryanna Shape --------------------------------------------------------------------------------  Indications: Pain, and Swelling.  Comparison Study: No previous study. Performing Technologist: McKayla Maag RVT, [place]  Examination Guidelines: A complete evaluation includes B-mode imaging, spectral Doppler, color Doppler, and power Doppler as needed of all accessible portions of each vessel. Bilateral testing is considered an integral part of a complete examination. Limited examinations for reoccurring indications may be performed as noted. The reflux portion of the exam is performed with the patient in reverse Trendelenburg.  +---------+---------------+---------+-----------+----------+--------------+ RIGHT    CompressibilityPhasicitySpontaneityPropertiesThrombus Aging +---------+---------------+---------+-----------+----------+--------------+ CFV      Full           Yes      Yes                                 +---------+---------------+---------+-----------+----------+--------------+ SFJ      Full                                                        +---------+---------------+---------+-----------+----------+--------------+ FV Prox  Full                                                        +---------+---------------+---------+-----------+----------+--------------+ FV Mid   Full                                                        +---------+---------------+---------+-----------+----------+--------------+ FV DistalFull                                                         +---------+---------------+---------+-----------+----------+--------------+ PFV      Full                                                        +---------+---------------+---------+-----------+----------+--------------+ POP  Full           Yes      Yes                                 +---------+---------------+---------+-----------+----------+--------------+ PTV      Full                                                        +---------+---------------+---------+-----------+----------+--------------+ PERO     Full                                                        +---------+---------------+---------+-----------+----------+--------------+   +---------+---------------+---------+-----------+----------+--------------+ LEFT     CompressibilityPhasicitySpontaneityPropertiesThrombus Aging +---------+---------------+---------+-----------+----------+--------------+ CFV      Full           Yes      Yes                                 +---------+---------------+---------+-----------+----------+--------------+ SFJ      Full                                                        +---------+---------------+---------+-----------+----------+--------------+ FV Prox  Full                                                        +---------+---------------+---------+-----------+----------+--------------+ FV Mid   Full                                                        +---------+---------------+---------+-----------+----------+--------------+ FV DistalFull                                                        +---------+---------------+---------+-----------+----------+--------------+ PFV      Full                                                        +---------+---------------+---------+-----------+----------+--------------+ POP      Full           Yes      Yes                                  +---------+---------------+---------+-----------+----------+--------------+  PTV      Full                                                        +---------+---------------+---------+-----------+----------+--------------+ PERO     Full                                                        +---------+---------------+---------+-----------+----------+--------------+     Summary: BILATERAL: - No evidence of deep vein thrombosis seen in the lower extremities, bilaterally. - No evidence of superficial venous thrombosis in the lower extremities, bilaterally. -No evidence of popliteal cyst, bilaterally.   *See table(s) above for measurements and observations.    Preliminary      Medical Consultants:   None.   Subjective:    STIRLING RUSTAD sleepy this morning responsive to verbal command  Objective:    Vitals:   07/26/22 2312 07/27/22 0009 07/27/22 0030 07/27/22 0404  BP:  119/89  (!) 139/93  Pulse: (!) 124 (!) 125  (!) 116  Resp: 18 20  17   Temp:  99.1 F (37.3 C)  97.9 F (36.6 C)  TempSrc:  Oral  Oral  SpO2: 100% 100%  95%  Weight:   56.9 kg   Height:   5\' 2"  (1.575 m)    SpO2: 95 %   Intake/Output Summary (Last 24 hours) at 07/27/2022 0732 Last data filed at 07/27/2022 6045 Gross per 24 hour  Intake 2292.5 ml  Output --  Net 2292.5 ml   Filed Weights   07/26/22 1758 07/27/22 0030  Weight: 54.4 kg 56.9 kg    Exam: General exam: In no acute distress. Respiratory system: Good air movement and clear to auscultation. Cardiovascular system: S1 & S2 heard, RRR. No JVD. Gastrointestinal system: Abdomen is nondistended, soft and nontender.  Extremities: No pedal edema. Skin: No rashes, lesions or ulcers  Data Reviewed:    Labs: Basic Metabolic Panel: Recent Labs  Lab 07/23/22 0009 07/26/22 2025 07/27/22 0431  NA 145 139 137  K 4.3 2.9* 4.2  CL 114* 106 108  CO2 11* 17* 21*  GLUCOSE 33* 41* 107*  BUN 18 8 7   CREATININE 0.92 0.81 0.91  CALCIUM 8.0*  7.8* 7.4*  MG 1.5*  --  1.3*  PHOS  --   --  1.5*   GFR Estimated Creatinine Clearance: 54.6 mL/min (by C-G formula based on SCr of 0.91 mg/dL). Liver Function Tests: Recent Labs  Lab 07/23/22 0009 07/26/22 2025 07/27/22 0431  AST 134* 93* 77*  ALT 71* 55* 47*  ALKPHOS 205* 173* 175*  BILITOT 1.2 1.3* 1.3*  PROT 6.8 7.0 6.1*  ALBUMIN 3.1* 3.0* 2.7*   Recent Labs  Lab 07/23/22 0009 07/26/22 2025  LIPASE 104* 100*   No results for input(s): "AMMONIA" in the last 168 hours. Coagulation profile No results for input(s): "INR", "PROTIME" in the last 168 hours. COVID-19 Labs  No results for input(s): "DDIMER", "FERRITIN", "LDH", "CRP" in the last 72 hours.  No results found for: "SARSCOV2NAA"  CBC: Recent Labs  Lab 07/23/22 0009 07/26/22 2025 07/27/22 0431  WBC 9.9 6.6 6.5  NEUTROABS 7.2 3.9  --  HGB 10.3* 9.4* 8.4*  HCT 33.1* 29.1* 26.2*  MCV 93.8 89.5 92.3  PLT 290 186 151   Cardiac Enzymes: No results for input(s): "CKTOTAL", "CKMB", "CKMBINDEX", "TROPONINI" in the last 168 hours. BNP (last 3 results) No results for input(s): "PROBNP" in the last 8760 hours. CBG: Recent Labs  Lab 07/23/22 0241 07/26/22 2206 07/27/22 0148 07/27/22 0401 07/27/22 0615  GLUCAP 105* 80 143* 111* 99   D-Dimer: No results for input(s): "DDIMER" in the last 72 hours. Hgb A1c: No results for input(s): "HGBA1C" in the last 72 hours. Lipid Profile: No results for input(s): "CHOL", "HDL", "LDLCALC", "TRIG", "CHOLHDL", "LDLDIRECT" in the last 72 hours. Thyroid function studies: No results for input(s): "TSH", "T4TOTAL", "T3FREE", "THYROIDAB" in the last 72 hours.  Invalid input(s): "FREET3" Anemia work up: No results for input(s): "VITAMINB12", "FOLATE", "FERRITIN", "TIBC", "IRON", "RETICCTPCT" in the last 72 hours. Sepsis Labs: Recent Labs  Lab 07/23/22 0009 07/26/22 2025 07/27/22 0431  WBC 9.9 6.6 6.5   Microbiology No results found for this or any previous visit  (from the past 240 hour(s)).   Medications:    enoxaparin (LOVENOX) injection  40 mg Subcutaneous Q24H   ferrous sulfate  325 mg Oral BID WC   folic acid  1 mg Oral Daily   gabapentin  300 mg Oral BID   levETIRAcetam  500 mg Oral BID   multivitamin with minerals  1 tablet Oral Daily   pantoprazole  40 mg Oral BID AC   potassium chloride  40 mEq Oral Once   thiamine  100 mg Oral Daily   Or   thiamine  100 mg Intravenous Daily   Continuous Infusions:  dextrose 5 % 1,000 mL with potassium chloride 40 mEq infusion 50 mL/hr at 07/27/22 0212      LOS: 1 day   Marinda Elk  [facility]  07/27/2022, 7:32 AM

## 2022-07-27 NOTE — H&P (Incomplete)
History and Physical  Christina Byrd XBJ:[id number] DOB: 1965-11-15 DOA: 07/26/2022  Referring physician: Maryanna Shape, PA-EDP  PCP: Christina Byrd  Outpatient Specialists: None Patient coming from: Home  Chief Complaint: Legs pain, "feels like needles are poking".  HPI: Christina Byrd is a 57 y.o. 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  States it feels like needles are poking her legs on and off.  Was seen in the ED last week for the same complaint.  Denies any injuries to her legs.  Also endorses periumbilical pain.  She has a history of bleeding angiectasia in the stomach, duodenal ulcer and stopped taking all her medications in the past 2 weeks.  Has been drinking alcohol excessively until last night when she only had 2 beers.  Heavy drinker at baseline, drinks 9 beers and 2 shots Byrd day.  She is trying to quit the use of alcohol completely.  States when she drinks alcohol she does not eat.  Her last meal was a week ago.  She presented to the ED for further evaluation.  In the ED, tremulous on exam, bilateral lower extremity exam was benign, Doppler ultrasound was negative for DVT.  Lab studies were notable for hypoglycemia with blood glucose of 41 elevated liver chemistries.  Right upper quadrant abdominal ultrasound revealed hepatic steatosis, cholelithiasis without evidence of cholecystitis.  Alcohol level was 38, serum potassium 2.9, serum bicarb 17, anion gap 16, albumin 3.0, alkaline phosphatase 173, AST 93, ALT 55, T. bili 1.3.  Due to concern for recurrent hypoglycemia EDP requested admission for further evaluation.  Admitted by  [facility], hospitalist service.  ED Course: Temperature 99.7.  BP 159/83, pulse 115, respiration rate 15, saturation 99% on room air.  Lab studies remarkable for alcohol level was 38, serum potassium 2.9, serum bicarb 17, anion gap 16, albumin 3.0, alkaline phosphatase 173, AST 93, ALT 55, T. bili 1.3.   Review of Systems: Review of systems as noted in the HPI. All other systems reviewed and are negative.   Past Medical History:  Diagnosis Date  . GASTROESOPHAGEAL REFLUX, NO ESOPHAGITIS 04/21/2006   Qualifier: Diagnosis of  By: Christina Byrd    . GERD (gastroesophageal reflux disease) Dx 1995  . HTN (hypertension)   . Seizures (HCC)    Past Surgical History:  Procedure Laterality Date  . BIOPSY  06/03/2022   Procedure: BIOPSY;  Surgeon: Christina Lucks, MD;  Location: [facility] ENDOSCOPY;  Service: Gastroenterology;;  . ESOPHAGOGASTRODUODENOSCOPY (EGD) WITH PROPOFOL N/A 06/03/2022   Procedure: ESOPHAGOGASTRODUODENOSCOPY (EGD) WITH PROPOFOL;  Surgeon: Christina Lucks, MD;  Location: [facility] ENDOSCOPY;  Service: Gastroenterology;  Laterality: N/A;  . HOT HEMOSTASIS N/A 06/03/2022   Procedure: HOT HEMOSTASIS (ARGON PLASMA COAGULATION/BICAP);  Surgeon: Christina Lucks, MD;  Location: [facility] ENDOSCOPY;  Service: Gastroenterology;  Laterality: N/A;    Social History:  reports that she quit smoking about 10 years ago. Her smoking use included cigarettes. She has never used smokeless tobacco. She reports that she does not currently use alcohol after a past usage of about 49.0 standard drinks of alcohol Byrd week. She reports that she does not use drugs.   Allergies  Allergen Reactions  . Losartan Potassium  Rash  . Levaquin [Levofloxacin In D5w] Rash  . Ace Inhibitors Cough    REACTION: cough  . Codeine Nausea Only  . Cefepime Rash    09/04/12 pm Patient started to break out with small macules after IV Vanco infusion, then macules increased in size after starting cefepime infusion.  .  Chlorhexidine Itching and Rash  . Vancomycin Rash    09/04/12 pm Patient started to break out with small macules after IV Vanco infusion, then macules increased in size after starting cefepime infusion.    Family History  Problem Relation Age of Onset  . CAD Mother       Prior to Admission medications   Medication Sig Start Date End Date Taking? Christina Byrd  amLODipine (NORVASC) 10 MG tablet TAKE 1 TABLET(10 MG) BY MOUTH DAILY Patient taking differently: Take 10 mg by mouth daily. TAKE 1 TABLET(10 MG) BY MOUTH DAILY 07/13/22  Yes Rai, Ripudeep K, MD  ferrous sulfate 325 (65 FE) MG tablet Take 1 tablet (325 mg total) by mouth 2 (two) times daily with a meal. 06/05/22  Yes Christina Sea, MD  folic acid (FOLVITE) 1 MG tablet Take 1 tablet (1 mg total) by mouth daily. 06/05/22  Yes Christina Sea, MD  levETIRAcetam (KEPPRA) 500 MG tablet Take 1 tablet (500 mg total) by mouth 2 (two) times daily. 07/13/22 11/10/22 Yes Rai, Ripudeep K, MD  magnesium oxide (MAG-OX) 400 (240 Mg) MG tablet TAKE 1 TABLET BY MOUTH EVERY DAY Patient taking differently: Take 400 mg by mouth daily. 06/24/21  Yes Christina Register, MD  Multiple Vitamin (MULTIVITAMIN WITH MINERALS) TABS tablet Take 1 tablet by mouth daily.   Yes Byrd, Historical, MD  ondansetron (ZOFRAN-ODT) 4 MG disintegrating tablet Take 1 tablet (4 mg total) by mouth every 8 (eight) hours as needed for nausea or vomiting. 05/29/22  Yes Rancour, Jeannett Senior, MD  pantoprazole (PROTONIX) 40 MG tablet Take 1 tablet (40 mg total) by mouth 2 (two) times daily before a meal. Patient taking differently: Take 40 mg by mouth 2 (two) times daily. 06/05/22  Yes Christina Sea, MD  potassium chloride SA (KLOR-CON M) 20 MEQ tablet Take 1 tablet (20 mEq total) by mouth 2 (two) times daily. 07/19/22  Yes Christina Booze, MD  allopurinol (ZYLOPRIM) 100 MG tablet Take 1 tablet (100 mg total) by mouth daily. Patient not taking: Reported on 07/26/2022 01/13/22   Christina Sessions, Christina Byrd  Blood Glucose Monitoring Suppl (TRUE METRIX METER) DEVI 1 kit by Does not apply route 3 (three) times daily. CHECK BLOOD SUGAR UP TO 3 TIMES DAILY. E11.9 02/27/19   Christina Register, MD  glucose blood (TRUE METRIX BLOOD GLUCOSE TEST) test strip Use as instructed 03/06/20   Christina Register, MD  lidocaine (LIDODERM) 5 % Place 1 patch onto the skin daily. Remove & Discard patch within 12 hours or as directed by MD. Apply to R knee Patient not taking: Reported on 07/26/2022 07/13/22   Rai, Delene Ruffini, MD  thiamine (VITAMIN B-1) 100 MG tablet Take 1 tablet (100 mg total) by mouth daily. Patient not taking: Reported on 07/12/2022 07/28/20   Van Clines, MD  traMADol (ULTRAM) 50 MG tablet Take 1 tablet (50 mg total) by mouth every 12 (twelve) hours as needed for severe pain. Patient not taking: Reported on 07/22/2022 07/12/22   Cathren Harsh, MD  TRUEplus Lancets 28G MISC SMARTSIG:Topical 1 to 3 Times Daily 03/06/20   Christina Register, MD  vitamin B-12 (CYANOCOBALAMIN) 1000  MCG tablet Take 1 tablet (1,000 mcg total) by mouth daily. Patient not taking: Reported on 07/12/2022 07/28/20   Van Clines, MD  metFORMIN (GLUCOPHAGE) 500 MG tablet Take 0.5 tablets (250 mg total) by mouth daily with breakfast. 03/05/20 04/02/20  Anders Simmonds, PA-C    Physical Exam: BP 130/74   Pulse (!) 118   Temp 99.7 F (37.6 C)   Resp 19   Ht 5\' 2"  (1.575 m)   Wt 54.4 kg   LMP 08/23/2011   SpO2 100%   BMI 21.95 kg/m   General: 57 y.o. year-old female well developed well nourished in no acute distress.  Alert and oriented x3. Cardiovascular: Tachycardic with no rubs or gallops.  No thyromegaly or JVD noted.  No lower extremity edema. 2/4 pulses in all 4 extremities. Respiratory: Clear to auscultation with no wheezes or rales. Good inspiratory effort. Abdomen: Soft periumbilical tenderness on palpation.  Nondistended with normal bowel sounds x4 quadrants. Muskuloskeletal: No cyanosis, clubbing or edema  noted bilaterally Neuro: CN II-XII intact, strength, sensation, reflexes Skin: No ulcerative lesions noted or rashes Psychiatry: Judgement and insight appear normal. Mood is appropriate for condition and setting          Labs on Admission:  Basic Metabolic Panel: Recent Labs  Lab 07/23/22 0009 07/26/22 2025  NA 145 139  Byrd 4.3 2.9*  CL 114* 106  CO2 11* 17*  GLUCOSE 33* 41*  BUN 18 8  CREATININE 0.92 0.81  CALCIUM 8.0* 7.8*  MG 1.5*  --    Liver Function Tests: Recent Labs  Lab 07/23/22 0009 07/26/22 2025  AST 134* 93*  ALT 71* 55*  ALKPHOS 205* 173*  BILITOT 1.2 1.3*  PROT 6.8 7.0  ALBUMIN 3.1* 3.0*   Recent Labs  Lab 07/23/22 0009 07/26/22 2025  LIPASE 104* 100*   No results for input(s): "AMMONIA" in the last 168 hours. CBC: Recent Labs  Lab 07/23/22 0009 07/26/22 2025  WBC 9.9 6.6  NEUTROABS 7.2 3.9  HGB 10.3* 9.4*  HCT 33.1* 29.1*  MCV 93.8 89.5  PLT 290 186   Cardiac Enzymes: No results for input(s): "CKTOTAL", "CKMB", "CKMBINDEX", "TROPONINI" in the last 168 hours.  BNP (last 3 results) Recent Labs    06/05/22 0249  BNP 42.9    ProBNP (last 3 results) No results for input(s): "PROBNP" in the last 8760 hours.  CBG: Recent Labs  Lab 07/23/22 0148 07/23/22 0241 07/26/22 2206  GLUCAP 76 105* 80    Radiological Exams on Admission: US Abdomen Limited RUQ (LIVER/GB)  Result Date: 07/26/2022 CLINICAL DATA:  Right upper quadrant pain EXAM: ULTRASOUND ABDOMEN LIMITED RIGHT UPPER QUADRANT COMPARISON:  Ultrasound 07/22/2022 and CT 05/29/2022 FINDINGS: Gallbladder: Mildly distended gallbladder with some layering sludge. No wall thickening or pericholecystic fluid. No sonographic Murphy's sign noted by the sonographer. Small 2 mm polyp. No follow-up recommended. Common bile duct: Diameter: 7 mm Liver: No focal lesion identified. Increased parenchymal echogenicity. Portal vein is patent on color Doppler imaging with normal direction of blood flow  towards the liver. Other: None. IMPRESSION: Cholelithiasis without evidence of cholecystitis. Hepatic steatosis. Electronically Signed   By: Minerva Fester M.D.   On: 07/26/2022 21:18   VAS [place] LOWER EXTREMITY VENOUS (DVT) (7a-7p)  Result Date: 07/26/2022  Lower Venous DVT Study Patient Name:  Christina Byrd Ivery  Date of Exam:   07/26/2022 Medical Rec #: [id number]           Accession #:    [id number]  Date of Birth: March 27, 1965           Patient Gender: F Patient Age:   57 years Exam Location:  [facility] Procedure:      VAS [place] LOWER EXTREMITY VENOUS (DVT) Referring Phys: Christina Byrd --------------------------------------------------------------------------------  Indications: Pain, and Swelling.  Comparison Study: No previous study. Performing Technologist: McKayla Maag RVT, VT  Examination Guidelines: A complete evaluation includes B-mode imaging, spectral Doppler, color Doppler, and power Doppler as needed of all accessible portions of each vessel. Bilateral testing is considered an integral part of a complete examination. Limited examinations for reoccurring indications may be performed as noted. The reflux portion of the exam is performed with the patient in reverse Trendelenburg.  +---------+---------------+---------+-----------+----------+--------------+ RIGHT    CompressibilityPhasicitySpontaneityPropertiesThrombus Aging +---------+---------------+---------+-----------+----------+--------------+ CFV      Full           Yes      Yes                                 +---------+---------------+---------+-----------+----------+--------------+ SFJ      Full                                                        +---------+---------------+---------+-----------+----------+--------------+ FV Prox  Full                                                        +---------+---------------+---------+-----------+----------+--------------+ FV Mid   Full                                                         +---------+---------------+---------+-----------+----------+--------------+ FV DistalFull                                                        +---------+---------------+---------+-----------+----------+--------------+ PFV      Full                                                        +---------+---------------+---------+-----------+----------+--------------+ POP      Full           Yes      Yes                                 +---------+---------------+---------+-----------+----------+--------------+ PTV      Full                                                        +---------+---------------+---------+-----------+----------+--------------+  PERO     Full                                                        +---------+---------------+---------+-----------+----------+--------------+   +---------+---------------+---------+-----------+----------+--------------+ LEFT     CompressibilityPhasicitySpontaneityPropertiesThrombus Aging +---------+---------------+---------+-----------+----------+--------------+ CFV      Full           Yes      Yes                                 +---------+---------------+---------+-----------+----------+--------------+ SFJ      Full                                                        +---------+---------------+---------+-----------+----------+--------------+ FV Prox  Full                                                        +---------+---------------+---------+-----------+----------+--------------+ FV Mid   Full                                                        +---------+---------------+---------+-----------+----------+--------------+ FV DistalFull                                                        +---------+---------------+---------+-----------+----------+--------------+ PFV      Full                                                         +---------+---------------+---------+-----------+----------+--------------+ POP      Full           Yes      Yes                                 +---------+---------------+---------+-----------+----------+--------------+ PTV      Full                                                        +---------+---------------+---------+-----------+----------+--------------+ PERO     Full                                                        +---------+---------------+---------+-----------+----------+--------------+  Summary: BILATERAL: - No evidence of deep vein thrombosis seen in the lower extremities, bilaterally. - No evidence of superficial venous thrombosis in the lower extremities, bilaterally. -No evidence of popliteal cyst, bilaterally.   *See table(s) above for measurements and observations.    Preliminary     EKG: I independently viewed the EKG done and my findings are as followed: Sinus tachycardia rate of 102.  Nonspecific ST-T changes.  QTc 464.  Assessment/Plan Present on Admission: . Hypoglycemia  Principal Problem:   Hypoglycemia  Hypoglycemia with history of type 2 diabetes, suspect multifactorial in the setting of alcohol abuse and starvation States when she drinks alcohol she does not eat, last meal was a week ago. Blood glucose on presentation 41, quickly corrected with starchy foods. Hemoglobin A1c 5.0 on 06/28/22 The patient has been off any hypoglycemics for the past 2 years Recommended complete alcohol cessation Continue to liberalize her diet, increase oral protein calorie intake. D5 KCl 40 mill equivalent at 50 cc/h x 1 day Repeat CBG every 2 hours x 3, if stable, then ACHS Dietitian consulted to provide patient education.  Alcohol abuse with concern for alcohol withdrawal Last alcohol intake was last night 2 beers. Tremulous on exam with concern for alcohol withdrawal Heavy drinker at baseline, drinks 9 beers and 2 shots Byrd day. The patient is willing  to completely quit alcohol use. CIWA protocol in place Continue multivitamins, folic acid and thiamine acid replacement TOC consulted to provide resources for complete alcohol cessation The patient would benefit from referral to Alcoholic Anonymous  Elevated liver chemistries in the setting of alcohol abuse Right upper quadrant abdominal ultrasound done on 07/26/2022 revealed liver steatosis and cholelithiasis without cholecystitis. Alcohol cessation counseling done at bedside Monitor LFTs and avoid hepatotoxic agents.  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 Rule out pancreatitis, trend lipase level. If no improvement of her periumbilical pain consider GI evaluation.  Medication noncompliance The patient admits to not taking her home medications in the last 2 weeks. Counseled on the importance of medication compliance.  Recent teeth extraction The patient has requested a referral to a dentist Stating the dentist who removed her teeth will not see her TOC consulted to provide referral to follow-up with a dentist  Hepatic steatosis Recommend complete alcohol cessation  Hypokalemia Potassium on admission 2.9 Repleted orally Check magnesium level Continue D5 KCl 40 meq at 50 cc/h x 1 day Repeat BMP in the morning  High anion gap metabolic acidosis Presented with serum bicarb of 17 with anion gap of 16 Gentle IV fluid hydration Repeat BMP in the morning  Seizure disorder Resume home Keppra  GERD History of duodenal ulcer Resume home Protonix 40 mg twice daily  Iron deficiency anemia Resume home iron supplement  Elevated T bilirubin and lipase Repeat chemistry panel and lipase level in the  morning to rule out biliary obstruction  History of type 2 diabetes, no longer on hypoglycemics   DVT prophylaxis: Subcu Lovenox daily  Code Status: Full code  Family Communication: The patient's significant other at bedside  Disposition Plan: Admitted to telemetry unit  Consults called: None.  Admission status: Observation status.   Status is: Observation    Darlin Drop MD [facility] Pager [phone]  If 7PM-7AM, please contact night-coverage  [email or web address] Password [email or web address]  07/26/2022, 10:20 PM

## 2022-07-28 DIAGNOSIS — E162 Hypoglycemia, unspecified: Secondary | ICD-10-CM | POA: Diagnosis not present

## 2022-07-28 LAB — RENAL FUNCTION PANEL
Albumin: 2.5 g/dL — ABNORMAL LOW (ref 3.5–5.0)
Anion gap: 8 (ref 5–15)
BUN: 5 mg/dL — ABNORMAL LOW (ref 6–20)
CO2: 23 mmol/L (ref 22–32)
Calcium: 7.6 mg/dL — ABNORMAL LOW (ref 8.9–10.3)
Chloride: 109 mmol/L (ref 98–111)
Creatinine, Ser: 0.84 mg/dL (ref 0.44–1.00)
GFR, Estimated: >60 mL/min (ref >60)
Glucose, Bld: 105 mg/dL — ABNORMAL HIGH (ref 70–99)
Phosphorus: 3 mg/dL (ref 2.5–4.6)
Potassium: 3.6 mmol/L (ref 3.5–5.1)
Sodium: 140 mmol/L (ref 135–145)

## 2022-07-28 LAB — GLUCOSE, CAPILLARY
Glucose-Capillary: 96 mg/dL (ref 70–99)
Glucose-Capillary: 122 mg/dL — ABNORMAL HIGH (ref 70–99)
Glucose-Capillary: 170 mg/dL — ABNORMAL HIGH (ref 70–99)
Glucose-Capillary: 164 mg/dL — ABNORMAL HIGH (ref 70–99)

## 2022-07-28 LAB — CBG MONITORING, ED: Glucose-Capillary: 116 mg/dL — ABNORMAL HIGH (ref 70–99)

## 2022-07-28 LAB — MAGNESIUM: Magnesium: 1.5 mg/dL — ABNORMAL LOW (ref 1.7–2.4)

## 2022-07-28 MED ORDER — LIDOCAINE 5 % EX PTCH
1.0000 | MEDICATED_PATCH | CUTANEOUS | Status: AC
  Administered 2022-07-28: 1 via TRANSDERMAL
  Filled 2022-07-28: qty 1

## 2022-07-28 MED ORDER — COLCHICINE 0.6 MG PO TABS
0.6000 mg | ORAL_TABLET | Freq: Two times a day (BID) | ORAL | Status: DC
  Administered 2022-07-28 – 2022-07-30 (×4): 0.6 mg via ORAL
  Filled 2022-07-28 (×4): qty 1

## 2022-07-28 MED ORDER — MAGNESIUM SULFATE 2 GM/50ML IV SOLN
2.0000 g | Freq: Once | INTRAVENOUS | Status: AC
  Administered 2022-07-28: 2 g via INTRAVENOUS
  Filled 2022-07-28: qty 50

## 2022-07-28 NOTE — TOC Initial Note (Signed)
Transition of Care [facility]) - Initial/Assessment Note    Patient Details  Name: Christina Byrd MRN: [id number] Date of Birth: 02-Feb-1966  Transition of Care [facility]) CM/SW Contact:    Larrie Kass, LCSW Phone Number: 07/28/2022, 10:00 AM  Clinical Narrative:                 CSW received a consult for a referral to a dentist and PCP. CSW spoke with pt , to inform her she will need to contact her insurance company to get [employer]. CSW inquired if she would like an appointment with one of the [facility] Clinics, pt agreed. Pt has no transportation or DME needs. Pt's PCP appointment was scheduled for June 18 at 3:15 pm, it was added to pt's AVS. No further TOC needs TOC sign-off.   Expected Discharge Plan: Home/Self Care Barriers to Discharge: Continued Medical Work up   Patient Goals and CMS Choice Patient states their goals for this hospitalization and ongoing recovery are:: retrun home          Expected Discharge Plan and Services       Living arrangements for the past 2 months: Single Family Home                                      Prior Living Arrangements/Services Living arrangements for the past 2 months: Single Family Home Lives with:: Self Patient language and need for interpreter reviewed:: Yes Do you feel safe going back to the place where you live?: Yes      Need for Family Participation in Patient Care: No (Comment) Care giver support system in place?: No (comment)   Criminal Activity/Legal Involvement Pertinent to Current Situation/Hospitalization: No - Comment as needed  Activities of Daily Living Home Assistive Devices/Equipment: None ADL Screening (condition at time of admission) Patient's cognitive ability adequate to safely complete daily activities?: Yes Is the patient deaf or have difficulty hearing?: No Does the patient have difficulty seeing, even when wearing glasses/contacts?: No Does the patient have difficulty  concentrating, remembering, or making decisions?: No Patient able to express need for assistance with ADLs?: No Does the patient have difficulty dressing or bathing?: No Independently performs ADLs?: Yes (appropriate for developmental age) Does the patient have difficulty walking or climbing stairs?: No Weakness of Legs: Both Weakness of Arms/Hands: None  Permission Sought/Granted                  Emotional Assessment Appearance:: Appears stated age Attitude/Demeanor/Rapport: Gracious Affect (typically observed): Accepting Orientation: : Oriented to Self, Oriented to Place, Oriented to  Time, Oriented to Situation Alcohol / Substance Use: Illicit Drugs, Alcohol Use Psych Involvement: No (comment)  Admission diagnosis:  Hypokalemia [E87.6] Hypoglycemia [E16.2] Coffee ground emesis [K92.0] Alcohol use disorder [F10.90] Patient Active Problem List   Diagnosis Date Noted   History of noncompliance with medical treatment 07/27/2022   Hepatic steatosis 07/27/2022   Hypokalemia 07/27/2022   High anion gap metabolic acidosis 07/27/2022   Hypoglycemia 07/26/2022   Coffee ground emesis 07/26/2022   Acute pain of right knee 07/08/2022   Transaminitis 07/08/2022   Iron deficiency anemia 07/08/2022   Alcohol withdrawal (HCC) 07/07/2022   GI bleed 06/03/2022   Alcohol abuse 06/03/2022   Duodenal ulcer hemorrhage 06/03/2022   AVM (arteriovenous malformation) of stomach, 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   Acute blood loss anemia 06/03/2022   Pharyngoesophageal dysphagia 03/28/2020  Cervicalgia 03/28/2020   Hot flashes 01/30/2020   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   Hypomagnesemia 03/14/2018   Type 2 diabetes mellitus (HCC) 09/13/2016   Depression 07/08/2016   Seizure disorder (HCC) 07/08/2016   Cocaine use    Hot flash, menopausal 06/22/2016   Intertrigo 06/22/2016   Gout of left ankle 10/09/2015   Left hip pain 09/02/2015   EtOH dependence  (HCC) 09/02/2015   Colonoscopy refused 09/02/2015   Hyperuricemia 05/27/2015   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   Pain and swelling of left ankle 12/02/2014   Tendinitis of right hip flexor 07/23/2014   Allergic rhinitis 05/13/2014   GERD (gastroesophageal reflux disease) 04/01/2014   Tendonitis, Achilles, right 04/01/2014   Osteoarthritis of left knee 12/11/2013   Ex-smoker 11/09/2008   OBESITY 08/12/2006   HYPERTENSION, BENIGN ESSENTIAL 08/12/2006   PCP:  Patient, No Pcp Per Pharmacy:   [id number] - [place], [place] - [address] AT [facility] OF [address]  [place] [address] Phone: [phone] Fax: [phone]  [id number] - [place], [place] - [address] AT [facility] OF [address] [facility] [address] [place] [address] Phone: [phone] Fax: [phone]  [facility] [address] [place] [address] Phone: [phone] Fax: [phone]     Social Determinants of Health (SDOH) Social History: SDOH Screenings   Food Insecurity: No Food Insecurity (07/27/2022)  Housing: Low Risk  (07/27/2022)  Transportation Needs: No Transportation Needs (07/27/2022)  Utilities: Not At Risk (07/27/2022)  Depression (PHQ2-9): Low Risk  (04/12/2022)  Tobacco Use: Medium Risk (07/26/2022)   SDOH Interventions:     Readmission Risk Interventions     No data to display

## 2022-07-28 NOTE — Progress Notes (Signed)
[facility]  PROGRESS NOTE  Christina Byrd ZOX:[id number] DOB: Aug 11, 1965 DOA: 07/26/2022 PCP: Patient, No Pcp Per   Brief HPI:   57 y.o. 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    Assessment/Plan:    Hypoglycemia with history of diabetes mellitus type 2 -Suspect alcohol abuse, starvation ketosis -Glucose was 41 admission, quickly corrected with starchy food -Hemoglobin  A1c 5.0, she has been hold hypoglycemic agents for 2 years -She was started on D5W which is currently stopped -CBG is better,  patient on dysphagia 3 diet  Acute gout attack -Right knee warm to touch, tender to palpation -She has history of gout -Will start  colchicine 0.6 mg p.o. twice daily  Alcohol abuse: Concerned about alcohol withdrawal.  was tremulous in the ED. Started on thiamine and folate. was also started on Ativan protocol. CIWA scores anywhere from 6-12. Continue Ativan.   Elevated LFTs in setting of alcohol use: Ultrasound showed hepatic steatosis.  has been counseled.   LFTs are trending down slowly.   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   Periumbilical pain/has a history of duodenal ulcer: Currently on PPI twice a day.  Now resolved Slight drop in hemoglobin likely due to hemoconcentration. Denies any signs of overt bleeding. Continue IV fluids.   Hypomagnesemia: -Will replace  magnesium -Follow magnesium level in a.m.   Medication noncompliance. Noted.   Hypokalemia: Replete    Iron deficiency anemia: Continue iron supplementation.    Medications     enoxaparin (LOVENOX) injection  40 mg Subcutaneous Q24H   feeding supplement  237 mL Oral BID BM   ferrous sulfate  325 mg Oral BID WC   folic acid  1 mg Oral Daily   gabapentin  300 mg Oral BID   levETIRAcetam  500 mg Oral BID   multivitamin with minerals  1 tablet Oral Daily   pantoprazole  40 mg Oral BID   potassium chloride  40 mEq Oral Once   thiamine  100 mg Oral Daily   Or   thiamine  100 mg Intravenous Daily     Data Reviewed:   CBG:  Recent Labs  Lab 07/27/22 0615 07/27/22 0742 07/27/22 1133 07/27/22 1649 07/28/22 0802  GLUCAP 99 99 98 97 96    SpO2: 95 % O2 Flow Rate (L/min): 2 L/min    Vitals:   07/27/22 1453 07/27/22 1702 07/27/22 2035 07/28/22 0423  BP: 125/80 (!) 140/86 (!) 141/86 138/87  Pulse: (!) 103 (!) 106 99 100  Resp: 14 16 16 17   Temp: 98.5 F (36.9 C) 98.3 F (36.8 C) 98 F (36.7 C) 98.4 F (36.9 C)  TempSrc: Axillary Oral Oral Oral  SpO2: 99% 98% 100% 95%  Weight:      Height:          Data Reviewed:  Basic Metabolic Panel: Recent Labs  Lab 07/23/22 0009 07/26/22 2025 07/27/22 0431 07/27/22 0914 07/28/22 0422  NA 145  139 137 137  --   K 4.3 2.9* 4.2 3.9  --   CL 114* 106 108 109  --   CO2 11* 17* 21* 22  --   GLUCOSE 33* 41* 107* 97  --   BUN 18 8 7 7   --   CREATININE 0.92 0.81 0.91 0.85  --   CALCIUM 8.0* 7.8* 7.4* 7.5*  --   MG 1.5*  --  1.3*  --  1.5*  PHOS  --   --  1.5* 1.6*  --     CBC: Recent Labs  Lab 07/23/22 0009 07/26/22 2025 07/27/22 0431  WBC 9.9 6.6 6.5  NEUTROABS 7.2 3.9  --   HGB 10.3* 9.4* 8.4*  HCT 33.1* 29.1* 26.2*  MCV 93.8 89.5 92.3  PLT 290 186 151    LFT Recent Labs  Lab 07/23/22 0009 07/26/22 2025 07/27/22 0431 07/27/22 0914  AST 134* 93* 77*  --   ALT 71* 55* 47*  --   ALKPHOS 205*  173* 175*  --   BILITOT 1.2 1.3* 1.3*  --   PROT 6.8 7.0 6.1*  --   ALBUMIN 3.1* 3.0* 2.7* 2.6*     Antibiotics: Anti-infectives (From admission, onward)    None        DVT prophylaxis: Lovenox  Code Status: Full code  Family Communication:    CONSULTS    Subjective   Patient seen and examined, complains of pain and swelling of right knee.  Objective    Physical Examination:  General-appears in no acute distress Heart-S1-S2, regular, no murmur auscultated Lungs-clear to auscultation bilaterally, no wheezing or crackles auscultated Abdomen-soft, nontender, no organomegaly Extremities-right knee warm to touch, swollen Neuro-alert, oriented x3, no focal deficit noted   Status is: Inpatient:             Meredeth Ide   [facility] If 7PM-7AM, please contact night-coverage at [email or web address], Office  [phone]   07/28/2022, 8:21 AM  LOS: 2 days

## 2022-07-29 DIAGNOSIS — G40909 Epilepsy, unspecified, not intractable, without status epilepticus: Secondary | ICD-10-CM

## 2022-07-29 DIAGNOSIS — M109 Gout, unspecified: Secondary | ICD-10-CM | POA: Diagnosis not present

## 2022-07-29 DIAGNOSIS — K760 Fatty (change of) liver, not elsewhere classified: Secondary | ICD-10-CM

## 2022-07-29 DIAGNOSIS — E162 Hypoglycemia, unspecified: Secondary | ICD-10-CM | POA: Diagnosis not present

## 2022-07-29 LAB — CBC
HCT: 26.1 % — ABNORMAL LOW (ref 36.0–46.0)
Hemoglobin: 8.4 g/dL — ABNORMAL LOW (ref 12.0–15.0)
MCH: 29.5 pg (ref 26.0–34.0)
MCHC: 32.2 g/dL (ref 30.0–36.0)
MCV: 91.6 fL (ref 80.0–100.0)
Platelets: 125 10*3/uL — ABNORMAL LOW (ref 150–400)
RBC: 2.85 MIL/uL — ABNORMAL LOW (ref 3.87–5.11)
RDW: 17.9 % — ABNORMAL HIGH (ref 11.5–15.5)
WBC: 9.1 10*3/uL (ref 4.0–10.5)
nRBC: 0 % (ref 0.0–0.2)

## 2022-07-29 LAB — COMPREHENSIVE METABOLIC PANEL
ALT: 28 U/L (ref 0–44)
AST: 30 U/L (ref 15–41)
Albumin: 2.5 g/dL — ABNORMAL LOW (ref 3.5–5.0)
Alkaline Phosphatase: 160 U/L — ABNORMAL HIGH (ref 38–126)
Anion gap: 5 (ref 5–15)
BUN: 10 mg/dL (ref 6–20)
CO2: 25 mmol/L (ref 22–32)
Calcium: 7.8 mg/dL — ABNORMAL LOW (ref 8.9–10.3)
Chloride: 105 mmol/L (ref 98–111)
Creatinine, Ser: 1.02 mg/dL — ABNORMAL HIGH (ref 0.44–1.00)
GFR, Estimated: >60 mL/min (ref >60)
Glucose, Bld: 122 mg/dL — ABNORMAL HIGH (ref 70–99)
Potassium: 3.6 mmol/L (ref 3.5–5.1)
Sodium: 135 mmol/L (ref 135–145)
Total Bilirubin: 0.7 mg/dL (ref 0.3–1.2)
Total Protein: 5.8 g/dL — ABNORMAL LOW (ref 6.5–8.1)

## 2022-07-29 LAB — MAGNESIUM: Magnesium: 1.6 mg/dL — ABNORMAL LOW (ref 1.7–2.4)

## 2022-07-29 LAB — GLUCOSE, CAPILLARY
Glucose-Capillary: 95 mg/dL (ref 70–99)
Glucose-Capillary: 127 mg/dL — ABNORMAL HIGH (ref 70–99)
Glucose-Capillary: 94 mg/dL (ref 70–99)
Glucose-Capillary: 157 mg/dL — ABNORMAL HIGH (ref 70–99)

## 2022-07-29 MED ORDER — KETOROLAC TROMETHAMINE 15 MG/ML IJ SOLN
15.0000 mg | Freq: Four times a day (QID) | INTRAMUSCULAR | Status: AC | PRN
  Administered 2022-07-29 – 2022-07-30 (×2): 15 mg via INTRAVENOUS
  Filled 2022-07-29 (×2): qty 1

## 2022-07-29 MED ORDER — MAGNESIUM SULFATE 2 GM/50ML IV SOLN
2.0000 g | Freq: Once | INTRAVENOUS | Status: AC
  Administered 2022-07-29: 2 g via INTRAVENOUS
  Filled 2022-07-29: qty 50

## 2022-07-29 MED ORDER — TRAMADOL HCL 50 MG PO TABS: 50.0000 mg | ORAL_TABLET | Freq: Four times a day (QID) | ORAL | Status: DC | PRN

## 2022-07-29 MED ORDER — SODIUM CHLORIDE 0.9 % IV SOLN: INTRAVENOUS | Status: DC

## 2022-07-29 MED ORDER — LIDOCAINE 5 % EX PTCH
1.0000 | MEDICATED_PATCH | CUTANEOUS | Status: AC
  Administered 2022-07-29: 1 via TRANSDERMAL
  Filled 2022-07-29: qty 1

## 2022-07-29 MED ORDER — KETOROLAC TROMETHAMINE 15 MG/ML IJ SOLN
15.0000 mg | Freq: Once | INTRAMUSCULAR | Status: AC
  Administered 2022-07-29: 15 mg via INTRAVENOUS
  Filled 2022-07-29: qty 1

## 2022-07-29 MED ORDER — KETOROLAC TROMETHAMINE 30 MG/ML IJ SOLN
30.0000 mg | Freq: Once | INTRAMUSCULAR | Status: AC
  Administered 2022-07-29: 30 mg via INTRAVENOUS
  Filled 2022-07-29: qty 1

## 2022-07-29 MED ORDER — ACETAMINOPHEN 325 MG PO TABS
650.0000 mg | ORAL_TABLET | Freq: Four times a day (QID) | ORAL | Status: DC | PRN
  Administered 2022-07-29 – 2022-07-31 (×6): 650 mg via ORAL
  Filled 2022-07-29 (×6): qty 2

## 2022-07-29 NOTE — Progress Notes (Signed)
[facility]  PROGRESS NOTE  Christina Byrd ZOX:[id number] DOB: 1965/09/22 DOA: 07/26/2022 PCP: Patient, No Pcp Per   Brief HPI:   58 y.o. 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    Assessment/Plan:    Hypoglycemia with history of diabetes mellitus type 2 -Suspect alcohol abuse, starvation ketosis -Glucose was 41 admission, quickly corrected with starchy food -Hemoglobin  A1c 5.0, she has been hold hypoglycemic agents for 2 years -She was started on D5W which is currently stopped -CBG is better,  patient on dysphagia 3 diet  Acute gout attack -Right knee warm to touch, tender to palpation -Improved after starting colchicine 0.6 mg p.o. twice daily -She has history of gout   Alcohol abuse: Concerned about alcohol withdrawal.  was tremulous in the ED. Started on thiamine and folate. was also started on Ativan protocol. CIWA scores anywhere from 6-12. Continue Ativan.   Elevated LFTs in setting of alcohol use: Ultrasound showed hepatic steatosis.  has been counseled.   LFTs are trending down slowly.   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   Periumbilical pain/has a history of duodenal ulcer: Currently on PPI twice a day.  Now resolved Slight drop in hemoglobin likely due to hemoconcentration. Denies any signs of overt bleeding. Continue IV fluids.   Hypomagnesemia: -Will  replace magnesium -Follow magnesium level in a.m.   Medication noncompliance. Noted.   Hypokalemia: Replete    Iron deficiency anemia: Continue iron supplementation.  Seizure disorder -Continue Keppra    Medications     colchicine  0.6 mg Oral BID   enoxaparin (LOVENOX) injection  40 mg Subcutaneous Q24H   feeding supplement  237 mL Oral BID BM   ferrous sulfate  325 mg Oral BID WC   folic acid  1 mg Oral Daily   gabapentin  300 mg Oral BID   levETIRAcetam  500 mg Oral BID   lidocaine  1 patch Transdermal Q24H   multivitamin with minerals  1 tablet Oral Daily   pantoprazole  40 mg Oral BID   thiamine  100 mg Oral Daily   Or   thiamine  100 mg Intravenous Daily     Data Reviewed:   CBG:  Recent Labs  Lab 07/28/22 0802 07/28/22 1211 07/28/22 1627 07/28/22 2050 07/29/22 0752  GLUCAP 96 122* 170* 164* 95    SpO2: 98 % O2 Flow Rate (L/min): 2 L/min    Vitals:   07/28/22 1214 07/28/22 2000 07/28/22 2049 07/29/22 0611  BP: 130/76 136/86 132/88 138/87  Pulse: (!) 107 (!) 104 (!) 107 91  Resp: 16  20 20   Temp: 99 F (37.2 C)  99.6 F (37.6 C) 99.2 F (37.3 C)  TempSrc: Oral  Oral Oral  SpO2: 100%  100% 98%  Weight:      Height:          Data Reviewed:  Basic Metabolic Panel: Recent Labs  Lab 07/23/22 0009 07/26/22 2025  07/27/22 0431 07/27/22 0914 07/28/22 0422 07/29/22 0435  NA 145 139 137 137 140 135  K 4.3 2.9* 4.2 3.9 3.6 3.6  CL 114* 106 108 109 109 105  CO2 11* 17* 21* 22 23 25   GLUCOSE 33* 41* 107* 97 105* 122*  BUN 18 8 7 7  5* 10  CREATININE 0.92 0.81 0.91 0.85 0.84 1.02*  CALCIUM 8.0* 7.8* 7.4* 7.5* 7.6* 7.8*  MG 1.5*  --  1.3*  --  1.5* 1.6*  PHOS  --   --  1.5* 1.6* 3.0  --     CBC: Recent Labs  Lab 07/23/22 0009 07/26/22 2025 07/27/22 0431 07/29/22 0435  WBC 9.9 6.6 6.5 9.1  NEUTROABS 7.2 3.9  --   --   HGB 10.3* 9.4* 8.4* 8.4*  HCT 33.1* 29.1* 26.2* 26.1*  MCV 93.8 89.5 92.3 91.6  PLT 290 186 151 125*     LFT Recent Labs  Lab 07/23/22 0009 07/26/22 2025 07/27/22 0431 07/27/22 0914 07/28/22 0422 07/29/22 0435  AST 134* 93* 77*  --   --  30  ALT 71* 55* 47*  --   --  28  ALKPHOS 205* 173* 175*  --   --  160*  BILITOT 1.2 1.3* 1.3*  --   --  0.7  PROT 6.8 7.0 6.1*  --   --  5.8*  ALBUMIN 3.1* 3.0* 2.7* 2.6* 2.5* 2.5*     Antibiotics: Anti-infectives (From admission, onward)    None        DVT prophylaxis: Lovenox  Code Status: Full code  Family Communication:    CONSULTS    Subjective   Feels better this morning.  Knee pain and swelling has improved after starting colchicine.  Objective    Physical Examination:  General-appears in no acute distress Heart-S1-S2, regular, no murmur auscultated Lungs-clear to auscultation bilaterally, no wheezing or crackles auscultated Abdomen-soft, nontender, no organomegaly Extremities-right knee swelling has improved, mild warmth to touch, no erythema noted Neuro-alert, oriented x3, no focal deficit noted   Status is: Inpatient:             Meredeth Ide   [facility] If 7PM-7AM, please contact night-coverage at [email or web address], Office  [phone]   07/29/2022, 8:24 AM  LOS: 3 days

## 2022-07-29 NOTE — Progress Notes (Signed)
Mobility Specialist Cancellation/Refusal Note:   Reason for Cancellation/Refusal: Pt declined mobility at this time. Pt stated "I want to take a nap but every time someone comes and bothers me". Will check back as schedule permits.    Billey Chang Mobility Specialist

## 2022-07-30 ENCOUNTER — Inpatient Hospital Stay (HOSPITAL_COMMUNITY): Payer: BLUE CROSS/BLUE SHIELD

## 2022-07-30 DIAGNOSIS — M109 Gout, unspecified: Secondary | ICD-10-CM | POA: Diagnosis not present

## 2022-07-30 DIAGNOSIS — K760 Fatty (change of) liver, not elsewhere classified: Secondary | ICD-10-CM | POA: Diagnosis not present

## 2022-07-30 DIAGNOSIS — E162 Hypoglycemia, unspecified: Secondary | ICD-10-CM | POA: Diagnosis not present

## 2022-07-30 DIAGNOSIS — G40909 Epilepsy, unspecified, not intractable, without status epilepticus: Secondary | ICD-10-CM | POA: Diagnosis not present

## 2022-07-30 LAB — BASIC METABOLIC PANEL
Anion gap: 8 (ref 5–15)
BUN: 12 mg/dL (ref 6–20)
CO2: 24 mmol/L (ref 22–32)
Calcium: 7.9 mg/dL — ABNORMAL LOW (ref 8.9–10.3)
Chloride: 107 mmol/L (ref 98–111)
Creatinine, Ser: 0.92 mg/dL (ref 0.44–1.00)
GFR, Estimated: >60 mL/min (ref >60)
Glucose, Bld: 111 mg/dL — ABNORMAL HIGH (ref 70–99)
Potassium: 3.8 mmol/L (ref 3.5–5.1)
Sodium: 139 mmol/L (ref 135–145)

## 2022-07-30 LAB — URIC ACID: Uric Acid, Serum: 7.3 mg/dL — ABNORMAL HIGH (ref 2.5–7.1)

## 2022-07-30 LAB — GLUCOSE, CAPILLARY
Glucose-Capillary: 101 mg/dL — ABNORMAL HIGH (ref 70–99)
Glucose-Capillary: 117 mg/dL — ABNORMAL HIGH (ref 70–99)
Glucose-Capillary: 118 mg/dL — ABNORMAL HIGH (ref 70–99)
Glucose-Capillary: 111 mg/dL — ABNORMAL HIGH (ref 70–99)

## 2022-07-30 LAB — MAGNESIUM: Magnesium: 1.8 mg/dL (ref 1.7–2.4)

## 2022-07-30 MED ORDER — COLCHICINE 0.6 MG PO TABS
0.6000 mg | ORAL_TABLET | Freq: Three times a day (TID) | ORAL | Status: DC
  Administered 2022-07-30 – 2022-07-31 (×3): 0.6 mg via ORAL
  Filled 2022-07-30 (×3): qty 1

## 2022-07-30 MED ORDER — KETOROLAC TROMETHAMINE 15 MG/ML IJ SOLN
15.0000 mg | Freq: Once | INTRAMUSCULAR | Status: AC
  Administered 2022-07-30: 15 mg via INTRAVENOUS
  Filled 2022-07-30: qty 1

## 2022-07-30 MED ORDER — LIDOCAINE 5 % EX PTCH
1.0000 | MEDICATED_PATCH | CUTANEOUS | Status: AC
  Administered 2022-07-30: 1 via TRANSDERMAL
  Filled 2022-07-30: qty 1

## 2022-07-30 NOTE — Progress Notes (Signed)
Mobility Specialist - Progress Note   07/30/22 1516  Mobility  Activity Ambulated with assistance to bathroom  Level of Assistance Standby assist, set-up cues, supervision of patient - no hands on  Assistive Device None  Distance Ambulated (ft) 10 ft  Range of Motion/Exercises Active  Activity Response Tolerated well  Mobility Referral Yes  $Mobility charge 1 Mobility  Mobility Specialist Start Time (ACUTE ONLY) 1510  Mobility Specialist Stop Time (ACUTE ONLY) 1516  Mobility Specialist Time Calculation (min) (ACUTE ONLY) 6 min   Pt found sitting on bed wanting assistance to the bathroom. Pt able to perform bed mobility and STS without assistance. Had no complaints and at EOS was left in the bathroom and pt verbilized understanding to pull the call bell when finished.  Billey Chang Mobility Specialist

## 2022-07-30 NOTE — Evaluation (Signed)
Physical Therapy Evaluation Patient Details Name: Christina Byrd MRN: [id number] DOB: 03-15-1965 Today's Date: 07/30/2022  History of Present Illness  57 y.o. 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  Also with acute gout flare.  Clinical Impression  Pt admitted with above diagnosis.  Pt currently with functional limitations due to the deficits listed below (see PT Problem List). Pt will benefit from acute skilled PT to increase their independence and safety with mobility to allow discharge.  Pt agreeable to mobilize however only able to tolerate a few feet of ambulation due to increased pain (pt with acute gout flare).  Pt states she can obtain RW for home (would need youth size).  Anticipate pt to progress well once pain improved/resolved.        Recommendations for follow up therapy are one component of a multi-disciplinary discharge planning process, led by the attending physician.  Recommendations may be updated based on patient status, additional functional criteria and insurance authorization.  Follow Up Recommendations       Assistance Recommended at Discharge PRN  Patient can return home with the following  Help with stairs or ramp for entrance;Assist for transportation    Equipment Recommendations Rolling walker (2 wheels)  Recommendations for Other Services       Functional Status Assessment Patient has had a recent decline in their functional status and demonstrates the ability to make significant improvements in function in a reasonable and predictable amount of time.     Precautions / Restrictions Precautions Precautions: Fall       Mobility  Bed Mobility Overal bed mobility: Independent                  Transfers Overall transfer level: Needs assistance Equipment used: Rolling walker (2 wheels) Transfers: Sit to/from Stand Sit to Stand: Min guard           General transfer comment: verbal cues for hand placement and safe technique    Ambulation/Gait Ambulation/Gait assistance: Min guard Gait Distance (Feet): 12 Feet Assistive device: Rolling walker (2 wheels) Gait Pattern/deviations: Step-to pattern, Decreased stance time - right, Antalgic       General Gait Details: pt reports gout pain worse today in Rt LE, cues for step to pattern for pain control as well as use of RW  Stairs            Wheelchair Mobility    Modified Rankin (Stroke Patients Only)       Balance Overall balance assessment:  (denies any recent falls)                                           Pertinent Vitals/Pain Pain Assessment Pain Assessment: Faces Faces Pain Scale: Hurts even more Pain Location: right leg, mostly all joints Pain Descriptors / Indicators: Sharp, Stabbing ("feels like gout") Pain Intervention(s): Monitored during session, Repositioned    Home Living Family/patient expects to be discharged to:: Private residence Living Arrangements: Spouse/significant other Available Help at Discharge: Family;Available 24 hours/day Type of Home: House Home Access: Stairs to enter Entrance Stairs-Rails: Right;Left;Can reach both Entrance [employer]  of Steps: 3-4   Home Layout: One level Home Equipment: Agricultural consultant (2 wheels);Wheelchair - manual Additional Comments: states she can get RW easily    Prior Function Prior Level of Function : Independent/Modified Independent             Mobility Comments: independent with all mobility, denies any recent falls       Hand Dominance        Extremity/Trunk Assessment        Lower Extremity Assessment Lower  Extremity Assessment: Generalized weakness (reports gout flare R LE worse than L LE, pain improved today but still present and limiting)    Cervical / Trunk Assessment Cervical / Trunk Assessment: Normal  Communication   Communication: No difficulties  Cognition Arousal/Alertness: Awake/alert Behavior During Therapy: WFL for tasks assessed/performed Overall Cognitive Status: Within Functional Limits for tasks assessed                                          General Comments      Exercises     Assessment/Plan    PT Assessment Patient needs continued PT services  PT Problem List Decreased strength;Decreased activity tolerance;Decreased mobility;Decreased balance;Pain;Decreased knowledge of use of DME       PT Treatment Interventions DME instruction;Gait training;Balance training;Therapeutic exercise;Functional mobility training;Therapeutic activities;Patient/family education;Stair training    PT Goals (Current goals can be found in the Care Plan section)  Acute Rehab PT Goals PT Goal Formulation: With patient Time For Goal Achievement: 08/13/22 Potential to Achieve Goals: Good    Frequency Min 1X/week     Co-evaluation               AM-PAC PT "6 Clicks" Mobility  Outcome Measure Help needed turning from your back to your side while in a flat bed without using bedrails?: None Help needed moving from lying on your back to sitting on the side of a flat bed without using bedrails?: None Help needed moving to and from a bed to a chair (including a wheelchair)?: A Little Help needed standing up from a chair using your arms (e.g., wheelchair or bedside chair)?: A Little Help needed to walk in hospital room?: A Little Help needed climbing 3-5 steps with a railing? : A Little 6 Click Score: 20    End of Session Equipment Utilized During Treatment: Gait belt Activity Tolerance: Patient limited by pain Patient left: in bed;with call bell/phone within  reach;with nursing/sitter in room;with bed alarm set Nurse Communication: Mobility status PT Visit Diagnosis: Difficulty in walking, not elsewhere classified (R26.2)    Time: 1610-9604 PT Time Calculation (min) (ACUTE ONLY): 14 min   Charges:   PT Evaluation $PT Eval Low Complexity: 1 Low     Kati PT, DPT Physical Therapist Acute Rehabilitation Services Office: [phone]   Kati L Payson 07/30/2022, 1:40 PM

## 2022-07-30 NOTE — Progress Notes (Signed)
[facility]  PROGRESS NOTE  Christina Byrd ZOX:[id number] DOB: [birth year] DOA: 07/26/2022 PCP: Patient, No Pcp Per   Brief HPI:   57 y.o. 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    Assessment/Plan:    Hypoglycemia with history of diabetes mellitus type 2 -Suspect alcohol abuse, starvation ketosis -Glucose was 41 admission, quickly corrected with starchy food -Hemoglobin  A1c 5.0, she has been hold hypoglycemic agents for 2 years -She was started on D5W which is currently stopped -CBG is better,  patient on dysphagia 3 diet  Acute gout attack -Right knee warm to touch, tender to palpation -Improved after starting colchicine 0.6 mg p.o. twice daily -Will change colchicine to 0.6 mg p.o. 3 times daily -Right knee x-ray shows severe osteoarthritis -Also received 2 doses of Toradol with significant improvement -She has history of gout   Alcohol abuse: Concerned about alcohol withdrawal.  was tremulous in the ED. Started on thiamine and folate. was also started on Ativan protocol. CIWA scores anywhere from 6-12. Continue Ativan as needed   Elevated LFTs in setting of alcohol use: Ultrasound showed hepatic steatosis.  has been counseled.   LFTs are trending down slowly.   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   Periumbilical pain/has a history of duodenal ulcer: Currently on PPI  twice a day.  Now resolved Slight drop in hemoglobin likely due to hemoconcentration. Denies any signs of overt bleeding. Continue IV fluids.   Hypomagnesemia: -Replete   Medication noncompliance. Noted.   Hypokalemia: Replete    Iron deficiency anemia: Continue iron supplementation.  Seizure disorder -Continue Keppra    Medications     colchicine  0.6 mg Oral Q8H   enoxaparin (LOVENOX) injection  40 mg Subcutaneous Q24H   feeding supplement  237 mL Oral BID BM   ferrous sulfate  325 mg Oral BID WC   folic acid  1 mg Oral Daily   gabapentin  300 mg Oral BID   levETIRAcetam  500 mg Oral BID   multivitamin with minerals  1 tablet Oral Daily   pantoprazole  40 mg Oral BID   thiamine  100 mg Oral Daily   Or   thiamine  100 mg Intravenous Daily     Data Reviewed:   CBG:  Recent Labs  Lab 07/29/22 1617 07/29/22 2051 07/30/22 0727 07/30/22 1137 07/30/22 1653  GLUCAP 94 157* 101* 117* 118*    SpO2: 99 % O2 Flow Rate (L/min): 2 L/min    Vitals:   07/29/22 2004 07/30/22 0613 07/30/22 0958 07/30/22 1247  BP: 138/87 (!) 148/83 (!) 146/94 123/61  Pulse: 93 86 91 91  Resp: 17 17  15   Temp: 99.2 F (37.3 C) 98.5 F (36.9 C)  97.8 F (36.6 C)  TempSrc: Oral Oral  Oral  SpO2: 100% 100%  99%  Weight:      Height:          Data Reviewed:  Basic Metabolic Panel: Recent Labs  Lab 07/27/22 0431 07/27/22 0914 07/28/22 0422 07/29/22 0435 07/30/22 0449  NA 137 137 140 135 139  K 4.2 3.9 3.6 3.6 3.8  CL 108 109 109 105 107  CO2 21* 22 23 25 24   GLUCOSE 107* 97 105* 122* 111*  BUN 7 7 5* 10 12  CREATININE 0.91 0.85 0.84 1.02* 0.92  CALCIUM 7.4* 7.5* 7.6* 7.8* 7.9*  MG 1.3*  --  1.5* 1.6* 1.8  PHOS 1.5* 1.6* 3.0  --   --     CBC: Recent Labs  Lab 07/26/22 2025 07/27/22 0431 07/29/22 0435  WBC 6.6 6.5 9.1  NEUTROABS 3.9  --   --   HGB 9.4* 8.4* 8.4*  HCT 29.1* 26.2* 26.1*  MCV 89.5 92.3 91.6  PLT 186 151 125*    LFT Recent Labs  Lab  07/26/22 2025 07/27/22 0431 07/27/22 0914 07/28/22 0422 07/29/22 0435  AST 93* 77*  --   --  30  ALT 55* 47*  --   --  28  ALKPHOS 173* 175*  --   --  160*  BILITOT 1.3* 1.3*  --   --  0.7  PROT 7.0 6.1*  --   --  5.8*  ALBUMIN 3.0* 2.7* 2.6* 2.5* 2.5*     Antibiotics: Anti-infectives (From admission, onward)    None        DVT prophylaxis: Lovenox  Code Status: Full code  Family Communication:    CONSULTS    Subjective   Patient complained of knee pain this morning, x-ray of the right knee obtained shows severe osteoarthritis.  Uric acid also elevated at 7.3.  Objective    Physical Examination:  General-appears in no acute distress Heart-S1-S2, regular, no murmur auscultated Lungs-clear to auscultation bilaterally, no wheezing or crackles auscultated Abdomen-soft, nontender, no organomegaly Extremities-right knee swelling has significantly improved, mild warmth to touch. Neuro-alert, oriented x3, no focal deficit noted   Status is: Inpatient:             Meredeth Ide   [facility] If 7PM-7AM, please contact night-coverage at [email or web address], Office  [phone]   07/30/2022, 5:12 PM  LOS: 4 days

## 2022-07-30 NOTE — Progress Notes (Signed)
Patient refused Lovenox injection today. Pt states that she will not allow administration because she is unable to warm food from room. Discussed importance of Lovenox therapy. Pt refused. Provider updated.

## 2022-07-31 DIAGNOSIS — I10 Essential (primary) hypertension: Secondary | ICD-10-CM

## 2022-07-31 DIAGNOSIS — F1010 Alcohol abuse, uncomplicated: Secondary | ICD-10-CM | POA: Diagnosis not present

## 2022-07-31 DIAGNOSIS — G40909 Epilepsy, unspecified, not intractable, without status epilepticus: Secondary | ICD-10-CM | POA: Diagnosis not present

## 2022-07-31 DIAGNOSIS — E162 Hypoglycemia, unspecified: Secondary | ICD-10-CM | POA: Diagnosis not present

## 2022-07-31 DIAGNOSIS — E876 Hypokalemia: Secondary | ICD-10-CM | POA: Diagnosis not present

## 2022-07-31 LAB — GLUCOSE, CAPILLARY
Glucose-Capillary: 75 mg/dL (ref 70–99)
Glucose-Capillary: 66 mg/dL — ABNORMAL LOW (ref 70–99)

## 2022-07-31 MED ORDER — COLCHICINE 0.6 MG PO TABS: 0.6000 mg | ORAL_TABLET | Freq: Two times a day (BID) | ORAL | 0 refills | Status: DC | PRN

## 2022-07-31 MED ORDER — KETOROLAC TROMETHAMINE 15 MG/ML IJ SOLN
15.0000 mg | Freq: Once | INTRAMUSCULAR | Status: AC
  Administered 2022-07-31: 15 mg via INTRAVENOUS
  Filled 2022-07-31: qty 1

## 2022-07-31 NOTE — Progress Notes (Signed)
Mobility Specialist - Progress Note   07/31/22 1040  Mobility  Activity Ambulated with assistance in hallway  Level of Assistance Standby assist, set-up cues, supervision of patient - no hands on  Assistive Device Front wheel walker  Distance Ambulated (ft) 150 ft  Range of Motion/Exercises Active  Activity Response Tolerated well  Mobility Referral Yes  $Mobility charge 1 Mobility  Mobility Specialist Start Time (ACUTE ONLY) 1025  Mobility Specialist Stop Time (ACUTE ONLY) 1038  Mobility Specialist Time Calculation (min) (ACUTE ONLY) 13 min   Pt was found in bed and agreeable to ambulate. Had no complaints during session and at EOS returned to bed with all necessities in reach and family in room.  Billey Chang Mobility Specialist

## 2022-07-31 NOTE — Progress Notes (Signed)
Lunchtime BG is 66. Given two orange juices to drink - attempted to recheck BG after drinking orange juice but patient refused. Asymptomatic - ate 100% of breakfast, denies dizziness.   Notified Dr. Sharl Ma - came to bedside to check on patient before discharge.   Discharge paperwork printed and reviewed with patient. All questions.concerns addressed. Wheeled patient down to private vehicle.

## 2022-07-31 NOTE — Discharge Summary (Signed)
Physician Discharge Summary   Patient: Christina Byrd MRN: [id number] DOB: 1965/09/12  Admit date:     07/26/2022  Discharge date: 07/31/22  Discharge Physician: Meredeth Ide   PCP: Patient, No Pcp Per   Recommendations at discharge:   Follow-up PCP on 08/10/2022  Discharge Diagnoses: Principal Problem:   Hypoglycemia Active Problems:   HYPERTENSION, BENIGN ESSENTIAL   Seizure disorder (HCC)   Alcohol abuse   Transaminitis   Coffee ground emesis   History of noncompliance with medical treatment   Hepatic steatosis   Hypokalemia   High anion gap metabolic acidosis  Resolved Problems:   * No resolved hospital problems. *  Hospital Course:  57 y.o. 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   Assessment and Plan:  Hypoglycemia with history of diabetes mellitus type 2 -Suspect alcohol abuse, starvation ketosis -Glucose was 41 admission, quickly corrected with starchy food -Hemoglobin  A1c 5.0, she has been hold hypoglycemic agents for 2 years -She was started on D5W which is currently stopped -CBG is better,  patient on dysphagia 3 diet -Patient CBG was 66, drank full glass of orange juice, she was asymptomatic.    However refused to get repeat CBG.  Told me that she will be going to be scheduled to get coffee with biscuit.   Acute gout attack -Right knee warm to touch, tender to palpation -Improved after starting colchicine 0.6 mg  p.o. twice daily -Right knee x-ray shows severe osteoarthritis -Also received 2 doses of Toradol with significant improvement -She has history of gout -Uric acid elevated 7.3, x-ray of right knee showed severe osteoarthritis and mild joint effusion -At this time gout has resolved.  Patient ambulating well. -Will discharge on colchicine 0.6 mg p.o. twice daily as needed     Alcohol abuse: Concerned about alcohol withdrawal.  was tremulous in the ED. Started on thiamine and folate. was also started on Ativan protocol. -No signs symptoms of alcohol withdrawal.   Elevated LFTs in setting of alcohol use: Ultrasound showed hepatic steatosis.  has been counseled.   LFTs are trending down slowly.    Periumbilical pain/has a history of duodenal ulcer: Currently on PPI twice a day.     Hypomagnesemia: -Replete   Medication noncompliance. Noted.   Hypokalemia: Replete    Iron deficiency anemia: Continue iron supplementation. -Hemoglobin stable at 8.4   Seizure disorder -Continue Keppra        Consultants:  Procedures performed:  Disposition: Home Diet recommendation:  Regular diet DISCHARGE MEDICATION: Allergies as of 07/31/2022       Reactions   Losartan Potassium    Rash   Levaquin [levofloxacin In D5w] Rash   Ace Inhibitors Cough   REACTION: cough   Codeine Nausea Only   Cefepime Rash   09/04/12 pm Patient started to break out with small macules after IV Vanco infusion, then macules increased in size after starting cefepime infusion.   Chlorhexidine Itching, Rash   Vancomycin Rash  09/04/12 pm Patient started to break out with small macules after IV Vanco infusion, then macules increased in size after starting cefepime infusion.        Medication List     STOP taking these medications    potassium chloride SA 20 MEQ tablet Commonly known as: KLOR-CON M       TAKE these medications    allopurinol 100 MG tablet Commonly known as: ZYLOPRIM Take 1  tablet (100 mg total) by mouth daily.   amLODipine 10 MG tablet Commonly known as: NORVASC TAKE 1 TABLET(10 MG) BY MOUTH DAILY What changed:  how much to take how to take this when to take this   colchicine 0.6 MG tablet Take 1 tablet (0.6 mg total) by mouth 2 (two) times daily as needed (gout).   cyanocobalamin 1000 MCG tablet Commonly known as: VITAMIN B12 Take 1 tablet (1,000 mcg total) by mouth daily.   ferrous sulfate 325 (65 FE) MG tablet Take 1 tablet (325 mg total) by mouth 2 (two) times daily with a meal.   folic acid 1 MG tablet Commonly known as: FOLVITE Take 1 tablet (1 mg total) by mouth daily.   levETIRAcetam 500 MG tablet Commonly known as: KEPPRA Take 1 tablet (500 mg total) by mouth 2 (two) times daily.   lidocaine 5 % Commonly known as: LIDODERM Place 1 patch onto the skin daily. Remove & Discard patch within 12 hours or as directed by MD. Apply to R knee   magnesium oxide 400 (240 Mg) MG tablet Commonly known as: MAG-OX TAKE 1 TABLET BY MOUTH EVERY DAY   multivitamin with minerals Tabs tablet Take 1 tablet by mouth daily.   ondansetron 4 MG disintegrating tablet Commonly known as: ZOFRAN-ODT Take 1 tablet (4 mg total) by mouth every 8 (eight) hours as needed for nausea or vomiting.   pantoprazole 40 MG tablet Commonly known as: PROTONIX Take 1 tablet (40 mg total) by mouth 2 (two) times daily before a meal. What changed: when to take this   thiamine 100 MG tablet Commonly known as: Vitamin B-1 Take 1 tablet (100 mg total) by mouth daily.   traMADol 50 MG tablet Commonly known as: ULTRAM Take 1 tablet (50 mg total) by mouth every 12 (twelve) hours as needed for severe pain.   True Metrix Blood Glucose Test test strip Generic drug: glucose blood Use as instructed   True Metrix Meter Devi 1 kit by Does not apply route 3 (three) times daily. CHECK BLOOD SUGAR UP TO 3 TIMES DAILY. E11.9   TRUEplus Lancets 28G Misc SMARTSIG:Topical 1 to 3  Times Daily        Follow-up Information     [facility]. Go on 08/10/2022.   Why: Your appointment is at 3:15 pm on June 18th. Contact information: [address] [place] [address] [phone]               Discharge Exam: Ceasar Mons Weights   07/26/22 1758 07/27/22 0030  Weight: 54.4 kg 56.9 kg   General-appears in no acute distress Heart-S1-S2, regular, no murmur auscultated Lungs-clear to auscultation bilaterally, no wheezing or crackles auscultated Abdomen-soft, nontender, no organomegaly Extremities-no edema in the lower extremities Neuro-alert, oriented x3, no focal deficit noted  Condition at discharge: good  The results of significant diagnostics from this hospitalization (including imaging, microbiology, ancillary and laboratory) are listed below for reference.   Imaging Studies: DG Knee 1-2 Views Right  Result Date: 07/30/2022  CLINICAL DATA:  Right knee pain and swelling.  Gout. EXAM: RIGHT KNEE - 1-2 VIEW COMPARISON:  Right knee radiographs 07/07/2022 FINDINGS: Severe medial joint space narrowing with subchondral sclerosis and moderate peripheral osteophytosis. Normal variant bipartite superolateral aspect of the patella. Mild patellofemoral joint space narrowing and superior osteophytosis. Mild chronic enthesopathic change at the quadriceps insertion on the patella. New mild-to-moderate joint effusion. No acute fracture or dislocation. IMPRESSION: 1. Severe medial compartment and mild-to-moderate patellofemoral compartment osteoarthritis. 2. New mild-to-moderate joint effusion. Electronically Signed   By: Neita Garnet M.D.   On: 07/30/2022 12:04   VAS [place] LOWER EXTREMITY VENOUS (DVT) (7a-7p)  Result Date: 07/27/2022  Lower Venous DVT Study Patient Name:  SHERLEE DETLEFSEN Ostrum  Date of Exam:   07/26/2022 Medical Rec #: [id number]           Accession #:    [id number] Date of Birth: 08/06/1965           Patient Gender: F Patient Age:    36 years Exam Location:  [facility] Procedure:      VAS [place] LOWER EXTREMITY VENOUS (DVT) Referring Phys: Maryanna Shape --------------------------------------------------------------------------------  Indications: Pain, and Swelling.  Comparison Study: No previous study. Performing Technologist: McKayla Maag RVT, [place]  Examination Guidelines: A complete evaluation includes B-mode imaging, spectral Doppler, color Doppler, and power Doppler as needed of all accessible portions of each vessel. Bilateral testing is considered an integral part of a complete examination. Limited examinations for reoccurring indications may be performed as noted. The reflux portion of the exam is performed with the patient in reverse Trendelenburg.  +---------+---------------+---------+-----------+----------+--------------+ RIGHT    CompressibilityPhasicitySpontaneityPropertiesThrombus Aging +---------+---------------+---------+-----------+----------+--------------+ CFV      Full           Yes      Yes                                 +---------+---------------+---------+-----------+----------+--------------+ SFJ      Full                                                        +---------+---------------+---------+-----------+----------+--------------+ FV Prox  Full                                                        +---------+---------------+---------+-----------+----------+--------------+ FV Mid   Full                                                        +---------+---------------+---------+-----------+----------+--------------+ FV DistalFull                                                        +---------+---------------+---------+-----------+----------+--------------+ PFV      Full                                                        +---------+---------------+---------+-----------+----------+--------------+  POP      Full           Yes      Yes                                  +---------+---------------+---------+-----------+----------+--------------+ PTV      Full                                                        +---------+---------------+---------+-----------+----------+--------------+ PERO     Full                                                        +---------+---------------+---------+-----------+----------+--------------+   +---------+---------------+---------+-----------+----------+--------------+ LEFT     CompressibilityPhasicitySpontaneityPropertiesThrombus Aging +---------+---------------+---------+-----------+----------+--------------+ CFV      Full           Yes      Yes                                 +---------+---------------+---------+-----------+----------+--------------+ SFJ      Full                                                        +---------+---------------+---------+-----------+----------+--------------+ FV Prox  Full                                                        +---------+---------------+---------+-----------+----------+--------------+ FV Mid   Full                                                        +---------+---------------+---------+-----------+----------+--------------+ FV DistalFull                                                        +---------+---------------+---------+-----------+----------+--------------+ PFV      Full                                                        +---------+---------------+---------+-----------+----------+--------------+ POP      Full           Yes      Yes                                 +---------+---------------+---------+-----------+----------+--------------+  PTV      Full                                                        +---------+---------------+---------+-----------+----------+--------------+ PERO     Full                                                         +---------+---------------+---------+-----------+----------+--------------+     Summary: BILATERAL: - No evidence of deep vein thrombosis seen in the lower extremities, bilaterally. - No evidence of superficial venous thrombosis in the lower extremities, bilaterally. -No evidence of popliteal cyst, bilaterally.   *See table(s) above for measurements and observations. Electronically signed by Waverly Ferrari MD on 07/27/2022 at 4:27:17 PM.    Final    US Abdomen Limited RUQ (LIVER/GB)  Result Date: 07/26/2022 CLINICAL DATA:  Right upper quadrant pain EXAM: ULTRASOUND ABDOMEN LIMITED RIGHT UPPER QUADRANT COMPARISON:  Ultrasound 07/22/2022 and CT 05/29/2022 FINDINGS: Gallbladder: Mildly distended gallbladder with some layering sludge. No wall thickening or pericholecystic fluid. No sonographic Murphy's sign noted by the sonographer. Small 2 mm polyp. No follow-up recommended. Common bile duct: Diameter: 7 mm Liver: No focal lesion identified. Increased parenchymal echogenicity. Portal vein is patent on color Doppler imaging with normal direction of blood flow towards the liver. Other: None. IMPRESSION: Cholelithiasis without evidence of cholecystitis. Hepatic steatosis. Electronically Signed   By: Minerva Fester M.D.   On: 07/26/2022 21:18   US Abdomen Limited RUQ (LIVER/GB)  Result Date: 07/22/2022 CLINICAL DATA:  Right upper quadrant pain EXAM: ULTRASOUND ABDOMEN LIMITED RIGHT UPPER QUADRANT COMPARISON:  07/08/2022 FINDINGS: Gallbladder: Gallbladder is well distended. Previously seen wall thickening has resolved in the interval. Negative sonographic Murphy's sign is elicited. Gallbladder sludge is seen. Common bile duct: Diameter: 5.8 mm Liver: Mildly increased in echogenicity consistent with fatty infiltration. Portal vein is patent on color Doppler imaging with normal direction of blood flow towards the liver. Other: None. IMPRESSION: Stable fatty infiltration. Previously seen gallbladder wall thickening  has resolved in the interval. Mild gallbladder sludge is noted. Electronically Signed   By: Alcide Clever M.D.   On: 07/22/2022 23:47   DG Chest 2 View  Result Date: 07/18/2022 CLINICAL DATA:  Chest pain EXAM: CHEST - 2 VIEW COMPARISON:  Jun 25, 2022 FINDINGS: The heart size and mediastinal contours are within normal limits. Both lungs are clear. The visualized skeletal structures are unremarkable. IMPRESSION: No active cardiopulmonary disease. Electronically Signed   By: Aram Candela M.D.   On: 07/18/2022 21:19   US Abdomen Limited RUQ (LIVER/GB)  Addendum Date: 07/08/2022   ADDENDUM REPORT: 07/08/2022 16:46 ADDENDUM: Right kidney may be slightly echogenic, correlate with appropriate laboratory values. Electronically Signed   By: Jasmine Pang M.D.   On: 07/08/2022 16:46   Result Date: 07/08/2022 CLINICAL DATA:  Transaminitis EXAM: ULTRASOUND ABDOMEN LIMITED RIGHT UPPER QUADRANT COMPARISON:  CT 05/29/2022 FINDINGS: Gallbladder: No shadowing stones. Diffuse gallbladder wall thickening measuring up to 7.8 mm. Negative sonographic Murphy. Common bile duct: Diameter: 4.3 mm Liver: Echogenic liver parenchyma. Question subtle surface nodularity. No focal hepatic abnormality. Portal vein is patent on color Doppler imaging with normal direction  of blood flow towards the liver. Other: Trace perihepatic ascites. IMPRESSION: 1. Echogenic liver parenchyma consistent with steatosis. Question subtle surface nodularity as may be seen with cirrhosis. 2. Diffuse nonspecific gallbladder wall thickening without sonographic Murphy. 3. Trace perihepatic ascites. Electronically Signed: By: Jasmine Pang M.D. On: 07/08/2022 16:43   DG Knee Complete 4 Views Right  Result Date: 07/07/2022 CLINICAL DATA:  Right knee pain. EXAM: RIGHT KNEE - COMPLETE 4+ VIEW COMPARISON:  AP Lat 02/11/2022. FINDINGS: There is normal bone mineralization without evidence of fracture, dislocation or joint effusions. 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 There is moderate joint space loss and osteophytosis involving the medial femorotibial compartment and mild narrowing and spurring of the patellofemoral compartment. Spurring change again noted of the tibial spines. The lateral compartment space is maintained. Findings consistent with nonerosive degenerative arthrosis. There are mild enthesopathic changes of the anterior patella. Soft tissues are unremarkable. Comparison to the prior study reveals no significant interval change. IMPRESSION: Nonerosive degenerative changes without evidence of fractures. Bipartite patellar configuration. No significant interval change. Electronically Signed   By: Almira Bar M.D.   On: 07/07/2022 22:28    Microbiology: Results for orders placed or performed in visit on 02/01/22  Anaerobic and Aerobic Culture     Status: None   Collection Time: 02/01/22  1:50 PM   Specimen: Joint, Knee; Synovial Fluid  Result Value Ref Range Status   MICRO NUMBER: [id number]  Final   SPECIMEN QUALITY: Adequate  Final   Source: SYNOVIAL FLUID  Final   STATUS: FINAL  Final   GRAM STAIN: No organisms or white blood cells seen  Final   ANA RESULT: No anaerobes isolated.  Final   MICRO NUMBER: [id number]  Final   SPECIMEN QUALITY: Adequate  Final   SOURCE: SYNOVIAL FLUID  Final   STATUS: FINAL  Final   AER RESULT: No Growth  Final    Labs: CBC: Recent Labs  Lab 07/26/22 2025 07/27/22 0431 07/29/22 0435  WBC 6.6 6.5 9.1  NEUTROABS 3.9  --   --   HGB 9.4* 8.4* 8.4*  HCT 29.1* 26.2* 26.1*  MCV 89.5 92.3 91.6  PLT 186 151 125*   Basic Metabolic Panel: Recent Labs  Lab 07/27/22 0431 07/27/22 0914 07/28/22 0422 07/29/22 0435 07/30/22 0449  NA 137 137 140 135 139  K 4.2 3.9 3.6 3.6 3.8  CL 108 109 109 105 107  CO2 21* 22 23 25 24   GLUCOSE 107* 97 105* 122* 111*  BUN 7 7 5* 10 12  CREATININE 0.91 0.85 0.84 1.02* 0.92  CALCIUM 7.4* 7.5* 7.6* 7.8* 7.9*  MG 1.3*  --  1.5* 1.6* 1.8  PHOS  1.5* 1.6* 3.0  --   --    Liver Function Tests: Recent Labs  Lab 07/26/22 2025 07/27/22 0431 07/27/22 0914 07/28/22 0422 07/29/22 0435  AST 93* 77*  --   --  30  ALT 55* 47*  --   --  28  ALKPHOS 173* 175*  --   --  160*  BILITOT 1.3* 1.3*  --   --  0.7  PROT 7.0 6.1*  --   --  5.8*  ALBUMIN 3.0* 2.7* 2.6* 2.5* 2.5*   CBG: Recent Labs  Lab 07/30/22 1137 07/30/22 1653 07/30/22 2054 07/31/22 0731 07/31/22 1133  GLUCAP 117* 118* 111* 75 66*    Discharge time spent: greater than 30 minutes.  Signed: Meredeth Ide, MD [facility] 07/31/2022

## 2022-08-07 ENCOUNTER — Other Ambulatory Visit: Payer: Self-pay | Admitting: Family Medicine

## 2022-08-07 DIAGNOSIS — E119 Type 2 diabetes mellitus without complications: Secondary | ICD-10-CM

## 2022-08-09 NOTE — Telephone Encounter (Signed)
Unable to refill per protocol, Rx expired. Discontinued 10/05/21.  Requested Prescriptions  Pending Prescriptions Disp Refills   atorvastatin (LIPITOR) 20 MG tablet [Pharmacy Med Name: ATORVASTATIN 20MG  TABLETS] 90 tablet 1    Sig: TAKE 1 TABLET(20 MG) BY MOUTH DAILY     Cardiovascular:  Antilipid - Statins Failed - 08/07/2022  4:31 PM      Failed - Lipid Panel in normal range within the last 12 months    Cholesterol, Total  Date Value Ref Range Status  04/27/2022 154 100 - 199 mg/dL Final   LDL Chol Calc (NIH)  Date Value Ref Range Status  04/27/2022 48 0 - 99 mg/dL Final   Direct LDL  Date Value Ref Range Status  01/30/2008 153 (H) mg/dL Final    Comment:    See lab report for associated comment(s)   HDL  Date Value Ref Range Status  04/27/2022 87 >39 mg/dL Final   Triglycerides  Date Value Ref Range Status  04/27/2022 109 0 - 149 mg/dL Final         Passed - Patient is not pregnant      Passed - Valid encounter within last 12 months    Recent Outpatient Visits           2 months ago Dental caries   [place] [facility] Grayce Sessions, NP   2 months ago Dental caries   [place] [facility] Grayce Sessions, NP   3 months ago Pain, dental   [place] [facility] Grayce Sessions, NP   3 months ago Pain, dental   [place] [facility] Grayce Sessions, NP   6 months ago Loss of weight    [facility] Grayce Sessions, NP

## 2022-08-10 ENCOUNTER — Ambulatory Visit (INDEPENDENT_AMBULATORY_CARE_PROVIDER_SITE_OTHER): Payer: BLUE CROSS/BLUE SHIELD

## 2022-08-10 ENCOUNTER — Other Ambulatory Visit: Payer: Self-pay

## 2022-08-10 ENCOUNTER — Other Ambulatory Visit: Payer: Self-pay | Admitting: Family Medicine

## 2022-08-10 VITALS — BP 150/105 | HR 102 | Temp 98.2°F | Ht 62.0 in | Wt 120.9 lb

## 2022-08-10 DIAGNOSIS — M1A0610 Idiopathic chronic gout, right knee, without tophus (tophi): Secondary | ICD-10-CM | POA: Diagnosis not present

## 2022-08-10 DIAGNOSIS — G40909 Epilepsy, unspecified, not intractable, without status epilepticus: Secondary | ICD-10-CM | POA: Diagnosis not present

## 2022-08-10 DIAGNOSIS — K264 Chronic or unspecified duodenal ulcer with hemorrhage: Secondary | ICD-10-CM

## 2022-08-10 DIAGNOSIS — K760 Fatty (change of) liver, not elsewhere classified: Secondary | ICD-10-CM | POA: Diagnosis not present

## 2022-08-10 DIAGNOSIS — F1029 Alcohol dependence with unspecified alcohol-induced disorder: Secondary | ICD-10-CM | POA: Diagnosis not present

## 2022-08-10 DIAGNOSIS — E119 Type 2 diabetes mellitus without complications: Secondary | ICD-10-CM

## 2022-08-10 DIAGNOSIS — I10 Essential (primary) hypertension: Secondary | ICD-10-CM

## 2022-08-10 NOTE — Assessment & Plan Note (Signed)
Patient presents with a history of GI bleeds likely secondary to alcohol use. She endorses daily PPI and denies any abdominal pain. No tenderness on exam. Denies melena, hematochezia or hematemesis. -continue protonix 40 mg bid

## 2022-08-10 NOTE — Assessment & Plan Note (Signed)
>>  ASSESSMENT AND PLAN FOR ALCOHOL USE DISORDER WRITTEN ON 08/10/2022  7:03 PM BY WHITE, JONATHAN, MD  Patient reports history of heavy alcohol use but has only been drinking two 7oz beers a day since recent discharge. She states she does not want any help with trying to quit including medicine. Encouraged patient to let us know if there is any way we can support her. She says she plans to quit cold [place] on her upcoming birthday. -continue to encourage abstinence from alcohol -congratulated on decrease in daily intake

## 2022-08-10 NOTE — Assessment & Plan Note (Signed)
Patient presents with history of hypertension. She takes amlodipine 10 mg daily and reports having taken this today. She denies chest pain, shortness of breath or vision changes. She reports having been in an argument before the appointment and feels this caused her blood pressure to be elevated. She left before we could check her blood pressure again. Recommend repeating at followup and considering additional antihypertensive if needed. -continue amlodipine 10 mg daily  -recent BMP with good renal function and electrolytes wnl

## 2022-08-10 NOTE — Progress Notes (Signed)
CC: hfu/establish care  HPI:  [name] is a 57 y.o. with past medical history as below who presents for HFU and to establish care. Please see detailed assessment and plan for HPI.  Health History Hx of heart failure Seizure disorder AUD Hypertension  Medications Allopurinol 100 mg daily Lipitor 20 mg daily Fenofibrate 48 mg daily Amlodipine 10 mg daily Colchicine 0.6 mg twice daily B12 1000 mcg daily Ferrous sulfate 325 mg twice daily (not taking currently) Folic acid 1 mg daily Levetiracetam 500 mg twice daily Protonix 40 mg twice daily Mag 400 mg daily  Family History Mother had CAD, OA, HTN, HLD Father AUD Brother OA, gout, shingles  Social History Lives with long time boyfriend. Junkyard dog - removes junk. Drinks beer (2 7 oz coronas a night) Quite smoking 20+ years.  Denies any other current or past drug use.  Past Medical History:  Diagnosis Date   GASTROESOPHAGEAL REFLUX, NO ESOPHAGITIS 04/21/2006   Qualifier: Diagnosis of  By: Levada Schilling     GERD (gastroesophageal reflux disease) Dx 1995   HTN (hypertension)    Seizures (HCC)    Review of Systems:  Please see detailed assessment and plan for pertinent ROS.  Physical Exam:  Vitals:   08/10/22 1529  BP: (!) 150/105  Pulse: (!) 102  Temp: 98.2 F (36.8 C)  TempSrc: Oral  SpO2: 100%  Weight: 120 lb 14.4 oz (54.8 kg)  Height: 5\' 2"  (1.575 m)   Physical Exam HENT:     Head: Normocephalic and atraumatic.  Eyes:     Extraocular Movements: Extraocular movements intact.  Cardiovascular:     Rate and Rhythm: Normal rate and regular rhythm.     Heart sounds: No murmur heard. Pulmonary:     Effort: Pulmonary effort is normal.     Breath sounds: No wheezing or rales.  Musculoskeletal:     Cervical back: Neck supple.     Right lower leg: No edema.     Left lower leg: No edema.  Skin:    General: Skin is warm and dry.  Neurological:     Mental Status: She is alert and oriented  to person, place, and time.  Psychiatric:        Mood and Affect: Mood normal.        Behavior: Behavior normal.      Assessment & Plan:   See Encounters Tab for problem based charting.  HYPERTENSION, BENIGN ESSENTIAL Patient presents with history of hypertension. She takes amlodipine 10 mg daily and reports having taken this today. She denies chest pain, shortness of breath or vision changes. She reports having been in an argument before the appointment and feels this caused her blood pressure to be elevated. She left before we could check her blood pressure again. Recommend repeating at followup and considering additional antihypertensive if needed. -continue amlodipine 10 mg daily  -recent BMP with good renal function and electrolytes wnl  Duodenal ulcer hemorrhage Patient presents with a history of GI bleeds likely secondary to alcohol use. She endorses daily PPI and denies any abdominal pain. No tenderness on exam. Denies melena, hematochezia or hematemesis. -continue protonix 40 mg bid  Hepatic steatosis Unable to address this problem today given time constraints but patient would benefit from monitoring for advancing liver disease in the future.  Idiopathic chronic gout of right knee without tophus Denies any symptoms today including pain or swelling. Reports she has been avoiding seafood. No tophi or signs of joint inflammation  on exam. Uric acid goal is 6 and her recent labs are trending in this direction.  -Continue daily allopurinol -colchicine as needed -repeat uric acid in the future  EtOH dependence (HCC) Patient reports history of heavy alcohol use but has only been drinking two 7oz beers a day since recent discharge. She states she does not want any help with trying to quit including medicine. Encouraged patient to let us know if there is any way we can support her. She says she plans to quit cold [place] on her upcoming birthday. -continue to encourage abstinence from  alcohol -congratulated on decrease in daily intake  Seizure disorder [employer]) Patient reports taking Keppra twice daily. Denies any recent seizures. She understands she is not to drive for six months after having seizure. -continue keppra -patient to schedule f/u with neurology  Patient discussed with Dr. Oswaldo Done

## 2022-08-10 NOTE — Assessment & Plan Note (Addendum)
Denies any symptoms today including pain or swelling. Reports she has been avoiding seafood. No tophi or signs of joint inflammation on exam. Uric acid goal is 6 and her recent labs are trending in this direction.  -Continue daily allopurinol -colchicine as needed -repeat uric acid in the future

## 2022-08-10 NOTE — Assessment & Plan Note (Signed)
Patient reports taking Keppra twice daily. Denies any recent seizures. She understands she is not to drive for six months after having seizure. -continue keppra -patient to schedule f/u with neurology

## 2022-08-10 NOTE — Assessment & Plan Note (Signed)
Unable to address this problem today given time constraints but patient would benefit from monitoring for advancing liver disease in the future.

## 2022-08-10 NOTE — Patient Instructions (Signed)
[name], it was a pleasure seeing you today!  Today we discussed: Alcohol use - continue to try to drink less and quit if possible Seizures - continue keppra twice daily. Make appointment with neurology. Blood pressure  - continue taking amlodipine 10 mg daily  Schedule appointment with cardiology.  I have ordered the following labs today:  Lab Orders  No laboratory test(s) ordered today     Tests ordered today:  none  Referrals ordered today:   Referral Orders  No referral(s) requested today     I have ordered the following medication/changed the following medications:   Stop the following medications: There are no discontinued medications.   Start the following medications: No orders of the defined types were placed in this encounter.    Follow-up: 3 months   Please make sure to arrive 15 minutes prior to your next appointment. If you arrive late, you may be asked to reschedule.   We look forward to seeing you next time. Please call our clinic at [phone] if you have any questions or concerns. The best time to call is Monday-Friday from 9am-4pm, but there is someone available 24/7. If after hours or the weekend, call the main hospital number and ask for the Internal Medicine Resident On-Call. If you need medication refills, please notify your pharmacy one week in advance and they will send [place] a request.  Thank you for letting us take part in your care. Wishing you the best!  Thank you, Adron Bene, MD

## 2022-08-10 NOTE — Assessment & Plan Note (Signed)
Patient reports history of heavy alcohol use but has only been drinking two 7oz beers a day since recent discharge. She states she does not want any help with trying to quit including medicine. Encouraged patient to let us know if there is any way we can support her. She says she plans to quit cold [place] on her upcoming birthday. -continue to encourage abstinence from alcohol -congratulated on decrease in daily intake

## 2022-08-12 NOTE — Progress Notes (Signed)
Internal Medicine Clinic Attending  Case discussed with Dr. White  At the time of the visit.  We reviewed the resident's history and exam and pertinent patient test results.  I agree with the assessment, diagnosis, and plan of care documented in the resident's note.  

## 2022-08-20 ENCOUNTER — Emergency Department (HOSPITAL_COMMUNITY)
Admission: EM | Admit: 2022-08-20 | Discharge: 2022-08-20 | Disposition: A | Discharge status: Home / Self Care | Payer: BLUE CROSS/BLUE SHIELD | Attending: Emergency Medicine | Admitting: Emergency Medicine

## 2022-08-20 ENCOUNTER — Encounter (HOSPITAL_COMMUNITY): Payer: Self-pay

## 2022-08-20 DIAGNOSIS — M109 Gout, unspecified: Secondary | ICD-10-CM | POA: Insufficient documentation

## 2022-08-20 DIAGNOSIS — Z79899 Other long term (current) drug therapy: Secondary | ICD-10-CM | POA: Diagnosis not present

## 2022-08-20 DIAGNOSIS — M25561 Pain in right knee: Secondary | ICD-10-CM | POA: Diagnosis present

## 2022-08-20 DIAGNOSIS — I10 Essential (primary) hypertension: Secondary | ICD-10-CM | POA: Insufficient documentation

## 2022-08-20 MED ORDER — OXYCODONE HCL 5 MG PO TABS: 10.0000 mg | ORAL_TABLET | Freq: Once | ORAL | Status: DC

## 2022-08-20 MED ORDER — HYDROCODONE-ACETAMINOPHEN 5-325 MG PO TABS
2.0000 | ORAL_TABLET | Freq: Once | ORAL | Status: AC
  Administered 2022-08-20: 2 via ORAL
  Filled 2022-08-20: qty 2

## 2022-08-20 MED ORDER — HYDROCODONE-ACETAMINOPHEN 5-325 MG PO TABS: 1.0000 | ORAL_TABLET | ORAL | 0 refills | Status: DC | PRN

## 2022-08-20 NOTE — Discharge Instructions (Addendum)
Take the prescribed medication as directed.  Start your colchicine, continue allopurinol. Follow-up with your primary care doctor. Return to the ED for new or worsening symptoms.

## 2022-08-20 NOTE — ED Provider Notes (Signed)
[place] EMERGENCY DEPARTMENT AT [facility] Provider Note   CSN: [id number] Arrival date & time: 08/20/22  4098     History  Chief Complaint  Patient presents with   Pain    Christina Byrd is a 57 y.o. female.  The history is provided by the patient and medical records.   57 year old female with history of GERD, alcohol abuse, hypertension, depression, gout, presenting to the ED with pain in her right knee and shoulder.  She feels like this is her gout pain again.  She denies any new falls or trauma.  No fever or chills.  She has been compliant with her allopurinol.  She denies eating red meat or fish but does continue drinking alcohol, usually 2-3 beers a day.  Home Medications Prior to Admission medications   Medication Sig Start Date End Date Taking? Authorizing Provider  HYDROcodone-acetaminophen (NORCO/VICODIN) 5-325 MG tablet Take 1 tablet by mouth every 4 (four) hours as needed. 08/20/22  Yes Garlon Hatchet, PA-C  allopurinol (ZYLOPRIM) 100 MG tablet Take 1 tablet (100 mg total) by mouth daily. Patient not taking: Reported on 07/26/2022 01/13/22   Grayce Sessions, NP  amLODipine (NORVASC) 10 MG tablet TAKE 1 TABLET(10 MG) BY MOUTH DAILY Patient taking differently: Take 10 mg by mouth daily. TAKE 1 TABLET(10 MG) BY MOUTH DAILY 07/13/22   Rai, Ripudeep K, MD  Blood Glucose Monitoring Suppl (TRUE METRIX METER) DEVI 1 kit by Does not apply route 3 (three) times daily. CHECK BLOOD SUGAR UP TO 3 TIMES DAILY. E11.9 02/27/19   Hoy Register, MD  colchicine 0.6 MG tablet Take 1 tablet (0.6 mg total) by mouth 2 (two) times daily as needed (gout). 07/31/22   Meredeth Ide, MD  ferrous sulfate 325 (65 FE) MG tablet Take 1 tablet (325 mg total) by mouth 2 (two) times daily with a meal. 06/05/22   Leroy Sea, MD  folic acid (FOLVITE) 1 MG tablet Take 1 tablet (1 mg total) by mouth daily. 06/05/22   Leroy Sea, MD  glucose blood (TRUE METRIX BLOOD GLUCOSE TEST)  test strip Use as instructed 03/06/20   Hoy Register, MD  levETIRAcetam (KEPPRA) 500 MG tablet Take 1 tablet (500 mg total) by mouth 2 (two) times daily. 07/13/22 11/10/22  Rai, Ripudeep K, MD  lidocaine (LIDODERM) 5 % Place 1 patch onto the skin daily. Remove & Discard patch within 12 hours or as directed by MD. Apply to R knee Patient not taking: Reported on 07/26/2022 07/13/22   Rai, Delene Ruffini, MD  magnesium oxide (MAG-OX) 400 (240 Mg) MG tablet TAKE 1 TABLET BY MOUTH EVERY DAY Patient taking differently: Take 400 mg by mouth daily. 06/24/21   Hoy Register, MD  Multiple Vitamin (MULTIVITAMIN WITH MINERALS) TABS tablet Take 1 tablet by mouth daily.    [provider]  ondansetron (ZOFRAN-ODT) 4 MG disintegrating tablet Take 1 tablet (4 mg total) by mouth every 8 (eight) hours as needed for nausea or vomiting. 05/29/22   Rancour, Jeannett Senior, MD  pantoprazole (PROTONIX) 40 MG tablet Take 1 tablet (40 mg total) by mouth 2 (two) times daily before a meal. Patient taking differently: Take 40 mg by mouth 2 (two) times daily. 06/05/22   Leroy Sea, MD  thiamine (VITAMIN B-1) 100 MG tablet Take 1 tablet (100 mg total) by mouth daily. Patient not taking: Reported on 07/12/2022 07/28/20   Van Clines, MD  traMADol (ULTRAM) 50 MG tablet Take 1 tablet (50  mg total) by mouth every 12 (twelve) hours as needed for severe pain. Patient not taking: Reported on 07/22/2022 07/12/22   Cathren Harsh, MD  TRUEplus Lancets 28G MISC SMARTSIG:Topical 1 to 3 Times Daily 03/06/20   Hoy Register, MD  vitamin B-12 (CYANOCOBALAMIN) 1000 MCG tablet Take 1 tablet (1,000 mcg total) by mouth daily. Patient not taking: Reported on 07/12/2022 07/28/20   Van Clines, MD  metFORMIN (GLUCOPHAGE) 500 MG tablet Take 0.5 tablets (250 mg total) by mouth daily with breakfast. 03/05/20 04/02/20  Anders Simmonds, PA-C      Allergies    Losartan potassium, Levaquin [levofloxacin in d5w], Ace inhibitors, Codeine, Cefepime,  Chlorhexidine, and Vancomycin    Review of Systems   Review of Systems  Musculoskeletal:  Positive for arthralgias.  All other systems reviewed and are negative.   Physical Exam Updated Vital Signs BP (!) 125/105 (BP Location: Right Arm)   Pulse (!) 105   Temp 98.8 F (37.1 C)   Resp 16   LMP 08/23/2011   SpO2 98%   Physical Exam Vitals and nursing note reviewed.  Constitutional:      Appearance: She is well-developed.  HENT:     Head: Normocephalic and atraumatic.  Eyes:     Conjunctiva/sclera: Conjunctivae normal.     Pupils: Pupils are equal, round, and reactive to light.  Cardiovascular:     Rate and Rhythm: Normal rate and regular rhythm.     Heart sounds: Normal heart sounds.  Pulmonary:     Effort: Pulmonary effort is normal.     Breath sounds: Normal breath sounds.  Abdominal:     General: Bowel sounds are normal.     Palpations: Abdomen is soft.  Musculoskeletal:        General: Normal range of motion.     Cervical back: Normal range of motion.     Comments: Right knee does have some very mild swelling, more so the medial aspect, there is no overlying erythema or induration, is able to range the knee but with some pain Right shoulder normal in appearance  Skin:    General: Skin is warm and dry.  Neurological:     Mental Status: She is alert and oriented to person, place, and time.     ED Results / Procedures / Treatments   Labs (all labs ordered are listed, but only abnormal results are displayed) Labs Reviewed - No data to display  EKG None  Radiology No results found.  Procedures Procedures    Medications Ordered in ED Medications  oxyCODONE (Oxy IR/ROXICODONE) immediate release tablet 10 mg (has no administration in time range)    ED Course/ Medical Decision Making/ A&P                             Medical Decision Making Risk Prescription drug management.   57 year old female here with right knee and shoulder pain.  History of  gout and reports this feels similar.  Does have some mild swelling to the knee on exam but no overlying erythema or induration.  She is afebrile and nontoxic.  Not clinically concerning for septic joint.  I suspect this is likely gout flare.  She does admit to continuing to drink alcohol which may be exacerbating her symptoms.  She takes chronic allopurinol, usually takes colchicine as needed.  She does have this at home but has not yet started it.  I advised her to  start the colchicine, will give a few tablets of Norco for pain control.  She can follow-up with her primary care doctor.  Return here for new concerns.  Final Clinical Impression(s) / ED Diagnoses Final diagnoses:  Acute gout of right knee, unspecified cause    Rx / DC Orders ED Discharge Orders          Ordered    HYDROcodone-acetaminophen (NORCO/VICODIN) 5-325 MG tablet  Every 4 hours PRN        08/20/22 0420              Garlon Hatchet, PA-C 08/20/22 Vicie Mutters, April, MD 08/20/22 1610

## 2022-08-20 NOTE — ED Triage Notes (Signed)
Pt is coming in for right knee and shoulder pain, Has a Hx of gout, some minimal to moderate swelling in the right knee. Pt took tylenol PTA.

## 2022-09-02 ENCOUNTER — Encounter: Payer: Self-pay | Admitting: Student

## 2022-09-02 ENCOUNTER — Ambulatory Visit (INDEPENDENT_AMBULATORY_CARE_PROVIDER_SITE_OTHER): Payer: BLUE CROSS/BLUE SHIELD | Admitting: Student

## 2022-09-02 VITALS — BP 103/68 | HR 92 | Temp 98.0°F | Wt 120.8 lb

## 2022-09-02 DIAGNOSIS — M1990 Unspecified osteoarthritis, unspecified site: Secondary | ICD-10-CM | POA: Diagnosis not present

## 2022-09-02 DIAGNOSIS — F1029 Alcohol dependence with unspecified alcohol-induced disorder: Secondary | ICD-10-CM | POA: Diagnosis not present

## 2022-09-02 DIAGNOSIS — M1A0610 Idiopathic chronic gout, right knee, without tophus (tophi): Secondary | ICD-10-CM

## 2022-09-02 MED ORDER — DICLOFENAC SODIUM 1 % EX GEL: 4.0000 g | CUTANEOUS | 3 refills | Status: DC | PRN

## 2022-09-02 MED ORDER — COLCHICINE 0.6 MG PO TABS
0.6000 mg | ORAL_TABLET | ORAL | 0 refills | Status: DC | PRN
Start: 2022-09-02 — End: 2022-10-22

## 2022-09-02 MED ORDER — ACETAMINOPHEN 500 MG PO TABS
1000.0000 mg | ORAL_TABLET | Freq: Three times a day (TID) | ORAL | 2 refills | Status: AC | PRN
Start: 2022-09-02 — End: 2023-09-02

## 2022-09-02 MED ORDER — ALLOPURINOL 100 MG PO TABS
200.0000 mg | ORAL_TABLET | Freq: Every day | ORAL | 11 refills | Status: DC
Start: 2022-09-02 — End: 2022-10-21

## 2022-09-02 NOTE — Assessment & Plan Note (Addendum)
Here for ED follow-up from 6/28 - Acute R knee swelling and pain similar to previous gout attacks. She was sent home with Norco but did not feel it was effective, instead she uses an old supply of diclofenac. Today, there is still some swelling and signs of inflammation, though it is resolving and swelling is going down, per pt. Will send her home with colchicine. She was prescribed this in the past but it does not appear she has tried it. In general, I would prefer her to avoid NSAIDs like diclofenac given her medical history which includes GI bleed and AVM of stomach. Her status is complicated by the presence of severe arthritis in the knees, which makes it difficult to distinguish gout and arthritic pain. Given increased frequency of attacks, her nightly beer-drinking habits, and elevated uric acid levels over time, will increase her allopurinol to 200 every day. - Allopurinol increase to 200qd - Colchicine 1.8mg  today and 1.2mg  qd subsequently as this attack wanes - Stop diclofenac - Encouraged patient to call the clinic for evaluation instead of the ED when her issue is a gout attack

## 2022-09-02 NOTE — Assessment & Plan Note (Signed)
>>  ASSESSMENT AND PLAN FOR ALCOHOL USE DISORDER WRITTEN ON 09/02/2022 11:42 AM BY JUBERG, CHRISTOPHER, DO  Pt reports that she drinks 3 7-oz beers per night and has quit hard liquor. This is an improvement from her previous history, though undoubtedly is still contributing to her health status.

## 2022-09-02 NOTE — Assessment & Plan Note (Signed)
Pt reports that she drinks 3 7-oz beers per night and has quit hard liquor. This is an improvement from her previous history, though undoubtedly is still contributing to her health status.

## 2022-09-02 NOTE — Progress Notes (Signed)
CC: ED follow up gout attack R knee  HPI:  Christina Byrd is a 57 y.o. female living with a history stated below and presents today for follow up of acute gout. Please see problem based assessment and plan for additional details.  Past Medical History:  Diagnosis Date   GASTROESOPHAGEAL REFLUX, NO ESOPHAGITIS 04/21/2006   Qualifier: Diagnosis of  By: Levada Schilling     GERD (gastroesophageal reflux disease) Dx 1995   HTN (hypertension)    Seizures (HCC)     Current Outpatient Medications on File Prior to Visit  Medication Sig Dispense Refill   amLODipine (NORVASC) 10 MG tablet TAKE 1 TABLET(10 MG) BY MOUTH DAILY (Patient taking differently: Take 10 mg by mouth daily. TAKE 1 TABLET(10 MG) BY MOUTH DAILY) 90 tablet 1   Blood Glucose Monitoring Suppl (TRUE METRIX METER) DEVI 1 kit by Does not apply route 3 (three) times daily. CHECK BLOOD SUGAR UP TO 3 TIMES DAILY. E11.9 1 each 0   ferrous sulfate 325 (65 FE) MG tablet Take 1 tablet (325 mg total) by mouth 2 (two) times daily with a meal. 60 tablet 0   folic acid (FOLVITE) 1 MG tablet Take 1 tablet (1 mg total) by mouth daily. 30 tablet 0   glucose blood (TRUE METRIX BLOOD GLUCOSE TEST) test strip Use as instructed 100 each 12   levETIRAcetam (KEPPRA) 500 MG tablet Take 1 tablet (500 mg total) by mouth 2 (two) times daily. 60 tablet 3   lidocaine (LIDODERM) 5 % Place 1 patch onto the skin daily. Remove & Discard patch within 12 hours or as directed by MD. Apply to R knee (Patient not taking: Reported on 07/26/2022) 30 patch 0   magnesium oxide (MAG-OX) 400 (240 Mg) MG tablet TAKE 1 TABLET BY MOUTH EVERY DAY (Patient taking differently: Take 400 mg by mouth daily.) 90 tablet 0   Multiple Vitamin (MULTIVITAMIN WITH MINERALS) TABS tablet Take 1 tablet by mouth daily.     ondansetron (ZOFRAN-ODT) 4 MG disintegrating tablet Take 1 tablet (4 mg total) by mouth every 8 (eight) hours as needed for nausea or vomiting. 20 tablet 0   pantoprazole  (PROTONIX) 40 MG tablet Take 1 tablet (40 mg total) by mouth 2 (two) times daily before a meal. (Patient taking differently: Take 40 mg by mouth 2 (two) times daily.) 60 tablet 2   thiamine (VITAMIN B-1) 100 MG tablet Take 1 tablet (100 mg total) by mouth daily. (Patient not taking: Reported on 07/12/2022) 30 tablet 11   traMADol (ULTRAM) 50 MG tablet Take 1 tablet (50 mg total) by mouth every 12 (twelve) hours as needed for severe pain. (Patient not taking: Reported on 07/22/2022) 30 tablet 0   TRUEplus Lancets 28G MISC SMARTSIG:Topical 1 to 3 Times Daily 100 each 3   vitamin B-12 (CYANOCOBALAMIN) 1000 MCG tablet Take 1 tablet (1,000 mcg total) by mouth daily. (Patient not taking: Reported on 07/12/2022) 30 tablet 11   [DISCONTINUED] metFORMIN (GLUCOPHAGE) 500 MG tablet Take 0.5 tablets (250 mg total) by mouth daily with breakfast. 45 tablet 6   No current facility-administered medications on file prior to visit.    Family History  Problem Relation Age of Onset   CAD Mother     Social History   Socioeconomic History   Marital status: Single    Spouse name: Not on file   Number of children: Not on file   Years of education: Not on file   Highest education level: Not  on file  Occupational History   Not on file  Tobacco Use   Smoking status: Former    Current packs/day: 0.00    Types: Cigarettes    Quit date: 06/25/2012    Years since quitting: 10.1   Smokeless tobacco: Never  Vaping Use   Vaping status: Never Used  Substance and Sexual Activity   Alcohol use: Not Currently    Alcohol/week: 49.0 standard drinks of alcohol    Types: 14 Cans of beer, 35 Shots of liquor per week   Drug use: No   Sexual activity: Not Currently  Other Topics Concern   Not on file  Social History Narrative   Pt lives in single story home with her partner   Has 1 son   5 grandchildren   10th grade education   Works at [employer].    Right handed   Social Determinants of Health   Financial  Resource Strain: Not on file  Food Insecurity: No Food Insecurity (07/27/2022)   Hunger Vital Sign    Worried About Running Out of Food in the Last Year: Never true    Ran Out of Food in the Last Year: Never true  Transportation Needs: No Transportation Needs (07/27/2022)   PRAPARE - Administrator, Civil Service (Medical): No    Lack of Transportation (Non-Medical): No  Physical Activity: Not on file  Stress: Not on file  Social Connections: Not on file  Intimate Partner Violence: Not At Risk (07/27/2022)   Humiliation, Afraid, Rape, and Kick questionnaire    Fear of Current or Ex-Partner: No    Emotionally Abused: No    Physically Abused: No    Sexually Abused: No    Review of Systems: ROS negative except for what is noted on the assessment and plan.  Vitals:   09/02/22 0951  BP: 103/68  Pulse: 92  Temp: 98 F (36.7 C)  TempSrc: Oral  SpO2: 100%  Weight: 120 lb 12.8 oz (54.8 kg)    Physical Exam: Constitutional: well-appearing female sitting in wheelchair, in no acute distress HENT: normocephalic atraumatic, mucous membranes moist Eyes: conjunctiva non-erythematous Cardiovascular: regular rate and rhythm, no m/r/g Pulmonary/Chest: normal work of breathing on room air, lungs clear to auscultation bilaterally Abdominal: soft, non-tender, non-distended MSK: normal bulk and tone. R knee is moderately swollen at medial and anterior aspect, warm, and tender to touch Neurological: alert & oriented x 3, no focal deficit Skin: warm and dry Psych: normal mood and behavior  Assessment & Plan:    Patient seen with Dr. Sol Blazing  Idiopathic chronic gout of right knee without tophus Here for ED follow-up from 6/28 - Acute R knee swelling and pain similar to previous gout attacks. She was sent home with Norco but did not feel it was effective, instead she uses an old supply of diclofenac. Today, there is still some swelling and signs of inflammation, though it is resolving and  swelling is going down, per pt. Will send her home with colchicine. She was prescribed this in the past but it does not appear she has tried it. In general, I would prefer her to avoid NSAIDs like diclofenac given her medical history which includes GI bleed and AVM of stomach. Her status is complicated by the presence of severe arthritis in the knees, which makes it difficult to distinguish gout and arthritic pain. Given increased frequency of attacks, her nightly beer-drinking habits, and elevated uric acid levels over time, will increase her allopurinol to  200 every day. - Allopurinol increase to 200qd - Colchicine 1.8mg  today and 1.2mg  qd subsequently as this attack wanes - Stop diclofenac - Encouraged patient to call the clinic for evaluation instead of the ED when her issue is a gout attack   EtOH dependence (HCC) Pt reports that she drinks 3 7-oz beers per night and has quit hard liquor. This is an improvement from her previous history, though undoubtedly is still contributing to her health status.  Osteoarthritis She does have significant arthritis in her knees and appears to be favoring diclofenac for pain control. In fact, she prefers diclofenac to the Norco she was sent home with at the ED. Recommend avoding diclofenac due to GI bleeds/ AVM history. - Tylenol 1000 TID prn - Voltaren gel prn - Consider role for PT/Injections going forward  [name], D.O. [facility] Internal Medicine, PGY-1 Phone: [phone] Date 09/02/2022 Time 11:46 AM

## 2022-09-02 NOTE — Patient Instructions (Signed)
Ms Soldo  Thank you for coming in today. This after visit summary is an important review of tests, referrals, and medication changes that were discussed during your visit. If you have questions or concerns, call the clinic at [phone] between 9am - 4pm. Outside of clinic business hours, call the main hospital at [phone] and ask the operator for the on-call internal medicine resident.  Your medicines: Allopurinol daily to prevent gout attacks Colchicine today to help ease gout pain Tylenol as needed for knee and joint pain Diclofenac gel as needed for knee and joint pain  Please call us during future gout attacks.  Best, Dr. [name]

## 2022-09-02 NOTE — Assessment & Plan Note (Addendum)
She does have significant arthritis in her knees and appears to be favoring diclofenac for pain control. In fact, she prefers diclofenac to the Norco she was sent home with at the ED. Recommend avoding diclofenac due to GI bleeds/ AVM history. - Tylenol 1000 TID prn - Voltaren gel prn - Consider role for PT/Injections going forward

## 2022-09-03 NOTE — Progress Notes (Signed)
Internal Medicine Clinic Attending  I was physically present during the key portions of the resident provided service and participated in the medical decision making of patient's management care. I reviewed pertinent patient test results.  The assessment, diagnosis, and plan were formulated together and I agree with the documentation in the resident's note. Concern for ongoing or recurrent gout flare of the right knee given exam with significant tenderness, warmth, and swelling. We discussed a course of colchicine for acute gout management. We also advised subsequent pain management for arthritis without use of NSAIDs given history of GIB as recently as April of this year.  Dickie La, MD

## 2022-09-27 ENCOUNTER — Inpatient Hospital Stay (HOSPITAL_COMMUNITY)
Admission: EM | Admit: 2022-09-27 | Discharge: 2022-09-30 | DRG: 896 | Disposition: A | Discharge status: Home / Self Care | Payer: BLUE CROSS/BLUE SHIELD | Attending: Pulmonary Disease | Admitting: Pulmonary Disease

## 2022-09-27 ENCOUNTER — Encounter (HOSPITAL_COMMUNITY): Payer: Self-pay

## 2022-09-27 ENCOUNTER — Other Ambulatory Visit: Payer: Self-pay

## 2022-09-27 ENCOUNTER — Inpatient Hospital Stay (HOSPITAL_COMMUNITY): Payer: BLUE CROSS/BLUE SHIELD

## 2022-09-27 ENCOUNTER — Emergency Department (HOSPITAL_COMMUNITY): Payer: BLUE CROSS/BLUE SHIELD

## 2022-09-27 DIAGNOSIS — R338 Other retention of urine: Secondary | ICD-10-CM | POA: Diagnosis present

## 2022-09-27 DIAGNOSIS — Z8249 Family history of ischemic heart disease and other diseases of the circulatory system: Secondary | ICD-10-CM

## 2022-09-27 DIAGNOSIS — E1165 Type 2 diabetes mellitus with hyperglycemia: Secondary | ICD-10-CM | POA: Diagnosis present

## 2022-09-27 DIAGNOSIS — F32A Depression, unspecified: Secondary | ICD-10-CM | POA: Diagnosis present

## 2022-09-27 DIAGNOSIS — Z888 Allergy status to other drugs, medicaments and biological substances status: Secondary | ICD-10-CM

## 2022-09-27 DIAGNOSIS — R569 Unspecified convulsions: Secondary | ICD-10-CM | POA: Diagnosis not present

## 2022-09-27 DIAGNOSIS — R9431 Abnormal electrocardiogram [ECG] [EKG]: Secondary | ICD-10-CM | POA: Diagnosis present

## 2022-09-27 DIAGNOSIS — T424X5A Adverse effect of benzodiazepines, initial encounter: Secondary | ICD-10-CM | POA: Diagnosis present

## 2022-09-27 DIAGNOSIS — I959 Hypotension, unspecified: Secondary | ICD-10-CM | POA: Diagnosis not present

## 2022-09-27 DIAGNOSIS — Z881 Allergy status to other antibiotic agents status: Secondary | ICD-10-CM | POA: Diagnosis not present

## 2022-09-27 DIAGNOSIS — F10931 Alcohol use, unspecified with withdrawal delirium: Secondary | ICD-10-CM | POA: Diagnosis not present

## 2022-09-27 DIAGNOSIS — K219 Gastro-esophageal reflux disease without esophagitis: Secondary | ICD-10-CM | POA: Diagnosis present

## 2022-09-27 DIAGNOSIS — E872 Acidosis, unspecified: Secondary | ICD-10-CM | POA: Diagnosis present

## 2022-09-27 DIAGNOSIS — Z885 Allergy status to narcotic agent status: Secondary | ICD-10-CM

## 2022-09-27 DIAGNOSIS — G928 Other toxic encephalopathy: Secondary | ICD-10-CM | POA: Diagnosis present

## 2022-09-27 DIAGNOSIS — D649 Anemia, unspecified: Secondary | ICD-10-CM | POA: Diagnosis present

## 2022-09-27 DIAGNOSIS — F10231 Alcohol dependence with withdrawal delirium: Secondary | ICD-10-CM | POA: Diagnosis present

## 2022-09-27 DIAGNOSIS — Z87891 Personal history of nicotine dependence: Secondary | ICD-10-CM | POA: Diagnosis not present

## 2022-09-27 DIAGNOSIS — M109 Gout, unspecified: Secondary | ICD-10-CM | POA: Diagnosis present

## 2022-09-27 DIAGNOSIS — G40909 Epilepsy, unspecified, not intractable, without status epilepticus: Secondary | ICD-10-CM | POA: Diagnosis present

## 2022-09-27 DIAGNOSIS — N179 Acute kidney failure, unspecified: Secondary | ICD-10-CM | POA: Diagnosis present

## 2022-09-27 DIAGNOSIS — E11649 Type 2 diabetes mellitus with hypoglycemia without coma: Secondary | ICD-10-CM | POA: Diagnosis not present

## 2022-09-27 DIAGNOSIS — E876 Hypokalemia: Secondary | ICD-10-CM | POA: Diagnosis present

## 2022-09-27 DIAGNOSIS — G9340 Encephalopathy, unspecified: Secondary | ICD-10-CM | POA: Diagnosis not present

## 2022-09-27 DIAGNOSIS — I10 Essential (primary) hypertension: Secondary | ICD-10-CM | POA: Diagnosis present

## 2022-09-27 DIAGNOSIS — Y900 Blood alcohol level of less than 20 mg/100 ml: Secondary | ICD-10-CM | POA: Diagnosis present

## 2022-09-27 DIAGNOSIS — Z79899 Other long term (current) drug therapy: Secondary | ICD-10-CM

## 2022-09-27 LAB — CBC WITH DIFFERENTIAL/PLATELET
Abs Immature Granulocytes: 0.04 10*3/uL (ref 0.00–0.07)
Basophils Absolute: 0.1 10*3/uL (ref 0.0–0.1)
Basophils Relative: 1 %
Eosinophils Absolute: 0 10*3/uL (ref 0.0–0.5)
Eosinophils Relative: 0 %
HCT: 33.3 % — ABNORMAL LOW (ref 36.0–46.0)
Hemoglobin: 10.4 g/dL — ABNORMAL LOW (ref 12.0–15.0)
Immature Granulocytes: 0 %
Lymphocytes Relative: 14 %
Lymphs Abs: 1.4 10*3/uL (ref 0.7–4.0)
MCH: 29.8 pg (ref 26.0–34.0)
MCHC: 31.2 g/dL (ref 30.0–36.0)
MCV: 95.4 fL (ref 80.0–100.0)
Monocytes Absolute: 0.4 10*3/uL (ref 0.1–1.0)
Monocytes Relative: 4 %
Neutro Abs: 7.6 10*3/uL (ref 1.7–7.7)
Neutrophils Relative %: 81 %
Platelets: 170 10*3/uL (ref 150–400)
RBC: 3.49 MIL/uL — ABNORMAL LOW (ref 3.87–5.11)
RDW: 17.2 % — ABNORMAL HIGH (ref 11.5–15.5)
WBC: 9.4 10*3/uL (ref 4.0–10.5)
nRBC: 0 % (ref 0.0–0.2)

## 2022-09-27 LAB — URINALYSIS, ROUTINE W REFLEX MICROSCOPIC
Bilirubin Urine: NEGATIVE
Glucose, UA: NEGATIVE mg/dL
Hgb urine dipstick: NEGATIVE
Ketones, ur: 5 mg/dL — AB
Leukocytes,Ua: NEGATIVE
Nitrite: NEGATIVE
Protein, ur: 30 mg/dL — AB
Specific Gravity, Urine: 1.013 (ref 1.005–1.030)
pH: 5 (ref 5.0–8.0)

## 2022-09-27 LAB — I-STAT VENOUS BLOOD GAS, ED
Acid-base deficit: 17 mmol/L — ABNORMAL HIGH (ref 0.0–2.0)
Bicarbonate: 10.6 mmol/L — ABNORMAL LOW (ref 20.0–28.0)
Calcium, Ion: 1.01 mmol/L — ABNORMAL LOW (ref 1.15–1.40)
HCT: 36 % (ref 36.0–46.0)
Hemoglobin: 12.2 g/dL (ref 12.0–15.0)
O2 Saturation: 83 %
Potassium: 3.3 mmol/L — ABNORMAL LOW (ref 3.5–5.1)
Sodium: 141 mmol/L (ref 135–145)
TCO2: 11 mmol/L — ABNORMAL LOW (ref 22–32)
pCO2, Ven: 30.7 mmHg — ABNORMAL LOW (ref 44–60)
pH, Ven: 7.144 — CL (ref 7.25–7.43)
pO2, Ven: 60 mmHg — ABNORMAL HIGH (ref 32–45)

## 2022-09-27 LAB — COMPREHENSIVE METABOLIC PANEL
ALT: 17 U/L (ref 0–44)
AST: 49 U/L — ABNORMAL HIGH (ref 15–41)
Albumin: 2.9 g/dL — ABNORMAL LOW (ref 3.5–5.0)
Alkaline Phosphatase: 70 U/L (ref 38–126)
Anion gap: 29 — ABNORMAL HIGH (ref 5–15)
BUN: 5 mg/dL — ABNORMAL LOW (ref 6–20)
CO2: 8 mmol/L — ABNORMAL LOW (ref 22–32)
Calcium: 7.8 mg/dL — ABNORMAL LOW (ref 8.9–10.3)
Chloride: 104 mmol/L (ref 98–111)
Creatinine, Ser: 1.32 mg/dL — ABNORMAL HIGH (ref 0.44–1.00)
GFR, Estimated: 47 mL/min — ABNORMAL LOW (ref >60)
Glucose, Bld: 212 mg/dL — ABNORMAL HIGH (ref 70–99)
Potassium: 3.1 mmol/L — ABNORMAL LOW (ref 3.5–5.1)
Sodium: 141 mmol/L (ref 135–145)
Total Bilirubin: 0.6 mg/dL (ref 0.3–1.2)
Total Protein: 6.6 g/dL (ref 6.5–8.1)

## 2022-09-27 LAB — I-STAT CG4 LACTIC ACID, ED
Lactic Acid, Venous: >15 mmol/L (ref 0.5–1.9)
Lactic Acid, Venous: 7.8 mmol/L (ref 0.5–1.9)

## 2022-09-27 LAB — I-STAT CHEM 8, ED
BUN: <3 mg/dL — ABNORMAL LOW (ref 6–20)
Calcium, Ion: 1 mmol/L — ABNORMAL LOW (ref 1.15–1.40)
Chloride: 106 mmol/L (ref 98–111)
Creatinine, Ser: 1.2 mg/dL — ABNORMAL HIGH (ref 0.44–1.00)
Glucose, Bld: 221 mg/dL — ABNORMAL HIGH (ref 70–99)
HCT: 36 % (ref 36.0–46.0)
Hemoglobin: 12.2 g/dL (ref 12.0–15.0)
Potassium: 3.3 mmol/L — ABNORMAL LOW (ref 3.5–5.1)
Sodium: 141 mmol/L (ref 135–145)
TCO2: 12 mmol/L — ABNORMAL LOW (ref 22–32)

## 2022-09-27 LAB — RAPID URINE DRUG SCREEN, HOSP PERFORMED
Amphetamines: NOT DETECTED
Barbiturates: NOT DETECTED
Benzodiazepines: POSITIVE — AB
Cocaine: NOT DETECTED
Opiates: NOT DETECTED
Tetrahydrocannabinol: NOT DETECTED

## 2022-09-27 LAB — TROPONIN I (HIGH SENSITIVITY): Troponin I (High Sensitivity): 17 ng/L (ref <18)

## 2022-09-27 LAB — ETHANOL: Alcohol, Ethyl (B): 11 mg/dL — ABNORMAL HIGH (ref <10)

## 2022-09-27 MED ORDER — THIAMINE HCL 100 MG/ML IJ SOLN
100.0000 mg | Freq: Every day | INTRAMUSCULAR | Status: DC
  Administered 2022-09-28: 100 mg via INTRAVENOUS
  Filled 2022-09-27: qty 2

## 2022-09-27 MED ORDER — INSULIN ASPART 100 UNIT/ML IJ SOLN
0.0000 [IU] | INTRAMUSCULAR | Status: DC
  Administered 2022-09-28: 2 [IU] via SUBCUTANEOUS

## 2022-09-27 MED ORDER — SODIUM CHLORIDE 0.9 % IV SOLN: INTRAVENOUS | Status: DC

## 2022-09-27 MED ORDER — POLYETHYLENE GLYCOL 3350 17 G PO PACK: 17.0000 g | PACK | Freq: Every day | ORAL | Status: DC | PRN

## 2022-09-27 MED ORDER — LORAZEPAM 2 MG/ML IJ SOLN
1.0000 mg | INTRAMUSCULAR | Status: DC | PRN
  Administered 2022-09-28 (×4): 2 mg via INTRAVENOUS
  Filled 2022-09-27 (×4): qty 1

## 2022-09-27 MED ORDER — SODIUM BICARBONATE 8.4 % IV SOLN
100.0000 meq | Freq: Once | INTRAVENOUS | Status: AC
  Administered 2022-09-28: 100 meq via INTRAVENOUS
  Filled 2022-09-27: qty 50

## 2022-09-27 MED ORDER — POTASSIUM CHLORIDE 2 MEQ/ML IV SOLN
INTRAVENOUS | Status: DC
  Filled 2022-09-27: qty 1000

## 2022-09-27 MED ORDER — HEPARIN SODIUM (PORCINE) 5000 UNIT/ML IJ SOLN
5000.0000 [IU] | Freq: Three times a day (TID) | INTRAMUSCULAR | Status: DC
  Administered 2022-09-27 – 2022-09-30 (×9): 5000 [IU] via SUBCUTANEOUS
  Filled 2022-09-27 (×9): qty 1

## 2022-09-27 MED ORDER — THIAMINE HCL 100 MG/ML IJ SOLN
100.0000 mg | Freq: Once | INTRAMUSCULAR | Status: AC
  Administered 2022-09-27: 100 mg via INTRAVENOUS
  Filled 2022-09-27: qty 2

## 2022-09-27 MED ORDER — LORAZEPAM 2 MG/ML IJ SOLN
2.0000 mg | Freq: Once | INTRAMUSCULAR | Status: AC
  Administered 2022-09-27: 2 mg via INTRAVENOUS
  Filled 2022-09-27: qty 1

## 2022-09-27 MED ORDER — LEVETIRACETAM IN NACL 1000 MG/100ML IV SOLN
1000.0000 mg | Freq: Once | INTRAVENOUS | Status: AC
  Administered 2022-09-27: 1000 mg via INTRAVENOUS
  Filled 2022-09-27: qty 100

## 2022-09-27 MED ORDER — DOCUSATE SODIUM 100 MG PO CAPS: 100.0000 mg | ORAL_CAPSULE | Freq: Two times a day (BID) | ORAL | Status: DC | PRN

## 2022-09-27 MED ORDER — ONDANSETRON HCL 4 MG/2ML IJ SOLN: 4.0000 mg | Freq: Four times a day (QID) | INTRAMUSCULAR | Status: DC | PRN

## 2022-09-27 MED ORDER — METOPROLOL TARTRATE 5 MG/5ML IV SOLN
5.0000 mg | Freq: Once | INTRAVENOUS | Status: AC
  Administered 2022-09-27: 5 mg via INTRAVENOUS
  Filled 2022-09-27: qty 5

## 2022-09-27 MED ORDER — SODIUM CHLORIDE 0.9 % IV BOLUS
1000.0000 mL | Freq: Once | INTRAVENOUS | Status: AC
  Administered 2022-09-27: 1000 mL via INTRAVENOUS

## 2022-09-27 MED ORDER — SODIUM CHLORIDE 0.9 % IV SOLN
1.0000 mg | Freq: Once | INTRAVENOUS | Status: AC
  Administered 2022-09-28: 1 mg via INTRAVENOUS
  Filled 2022-09-27: qty 0.2

## 2022-09-27 MED ORDER — POTASSIUM CHLORIDE 10 MEQ/100ML IV SOLN
10.0000 meq | INTRAVENOUS | Status: AC
  Administered 2022-09-27 (×2): 10 meq via INTRAVENOUS
  Filled 2022-09-27 (×2): qty 100

## 2022-09-27 NOTE — H&P (Signed)
NAME:  Christina Byrd, MRN:  [id number], DOB:  [birth year], LOS: 0 ADMISSION DATE:  09/27/2022, CONSULTATION DATE:  09/27/22 REFERRING MD:  EDP, CHIEF COMPLAINT:  sz 2/2 acute etoh withdrawal   History of Present Illness:  57 yo female with known pmh of etoh abuse presents this evening after sz. Etoh level 11. Poor historian and quite somnolent but protecting airway so all history is provided by EDP and chart review. Per report pt presented via ems after family witnesses sz like activity x2 lasting ~20 seconds each. Pt was noted to have one en route with EMS as well. Per family pt has been drinking again which has previously resulted in sz.    Ccm was consulted for admission in light of pt's somnolence in post ictal and acute etoh withdrawal state.   Upon assessment pt was noted to be lethargic but arousable. Non verbal but opens eyes to voice and moves all 4 extremities to tactile stimuli. Tachycardic to 130's.  Pertinent  Medical History  Para March abuse Depression Gout   Significant Hospital Events: Including procedures, antibiotic start and stop dates in addition to other pertinent events   Admitted to ICU 8/5  Interim History / Subjective:  As above  Objective   Blood pressure (!) 133/117, pulse (!) 117, temperature 98.6 F (37 C), temperature source Oral, resp. rate 16, last menstrual period 08/23/2011, SpO2 100%.        Intake/Output Summary (Last 24 hours) at 09/27/2022 2338 Last data filed at 09/27/2022 2200 Gross per 24 hour  Intake 600 ml  Output --  Net 600 ml   There were no vitals filed for this visit.  Examination: General: encephalopathic but awake, moving all extremities, appears comfortable laying supine in bed HENT: ncat, eomi, sclera injected, perrla. Mm dry but pink Lungs: ctab Cardiovascular: tachycardic but reg rhythm Abdomen: soft, nt,nd bs + Extremities: no c/c/e Neuro: awake, not following commands, non verbal but moans with stimulation GU:  deferred.   Resolved Hospital Problem list     Assessment & Plan:  Acute etoh withdrawal H/o etoh abuse Sz 2/2 above Acute toxic encephalopathy Depression Gout -cont ciwa -monitor for airway protection -neuro checks per neuro recommendations -appreciate EDP contacting neuro after acute sz, will await recommendations -resume home meds when able to take po -will check ammonia and am labs as well -uds positive for benzo but perhaps did receive en route with ems Metabolic acidosis:  Lactic acidosis -bicarb amps x2 -suspect 2/2 acute presentation with starvation and etoh withdrawal sz Hypokalemia:  -replace Aki:  -monitor indices -suspect with resuscitation will improve -if not, consider renal US  -will send urine lytes Hyperglycemia:  -last A1c in 07/11/22 was only 5 -however hyperglycemic on presentation -will start ssi and q4 bs checks     Best Practice (right click and "Reselect all SmartList Selections" daily)   Diet/type: NPO DVT prophylaxis: prophylactic heparin  GI prophylaxis: N/A Lines: N/A Foley:  N/A Code Status:  full code Last date of multidisciplinary goals of care discussion [pending family discussion or pt mentation]  Labs   CBC: Recent Labs  Lab 09/27/22 2051 09/27/22 2116 09/27/22 2118  WBC 9.4  --   --   NEUTROABS 7.6  --   --   HGB 10.4* 12.2 12.2  HCT 33.3* 36.0 36.0  MCV 95.4  --   --   PLT 170  --   --     Basic Metabolic Panel: Recent Labs  Lab 09/27/22 2051  09/27/22 2116 09/27/22 2118  NA 141 141 141  K 3.1* 3.3* 3.3*  CL 104  --  106  CO2 8*  --   --   GLUCOSE 212*  --  221*  BUN 5*  --  <3*  CREATININE 1.32*  --  1.20*  CALCIUM 7.8*  --   --    GFR: CrCl cannot be calculated (Unknown ideal weight.). Recent Labs  Lab 09/27/22 2051 09/27/22 2116 09/27/22 2313  WBC 9.4  --   --   LATICACIDVEN  --  >15.0* 7.8*    Liver Function Tests: Recent Labs  Lab 09/27/22 2051  AST 49*  ALT 17  ALKPHOS 70  BILITOT  0.6  PROT 6.6  ALBUMIN 2.9*   No results for input(s): "LIPASE", "AMYLASE" in the last 168 hours. No results for input(s): "AMMONIA" in the last 168 hours.  ABG    Component Value Date/Time   HCO3 10.6 (L) 09/27/2022 2116   TCO2 12 (L) 09/27/2022 2118   ACIDBASEDEF 17.0 (H) 09/27/2022 2116   O2SAT 83 09/27/2022 2116     Coagulation Profile: No results for input(s): "INR", "PROTIME" in the last 168 hours.  Cardiac Enzymes: No results for input(s): "CKTOTAL", "CKMB", "CKMBINDEX", "TROPONINI" in the last 168 hours.  HbA1C: HbA1c, POC (controlled diabetic range)  Date/Time Value Ref Range Status  03/24/2021 11:05 AM 5.5 0.0 - 7.0 % Final  11/17/2020 04:13 PM 5.4 0.0 - 7.0 % Final   Hgb A1c MFr Bld  Date/Time Value Ref Range Status  07/11/2022 04:03 AM 5.0 4.8 - 5.6 % Final    Comment:    (NOTE) Pre diabetes:          5.7%-6.4%  Diabetes:              >6.4%  Glycemic control for   <7.0% adults with diabetes   04/27/2022 11:25 AM 4.6 (L) 4.8 - 5.6 % Final    Comment:             Prediabetes: 5.7 - 6.4          Diabetes: >6.4          Glycemic control for adults with diabetes: <7.0     CBG: No results for input(s): "GLUCAP" in the last 168 hours.  Review of Systems:   Unobtainable 2/2 pt encephalopathy, unreliable historian  Past Medical History:  She,  has a past medical history of GASTROESOPHAGEAL REFLUX, NO ESOPHAGITIS (04/21/2006), GERD (gastroesophageal reflux disease) (Dx 1995), HTN (hypertension), and Seizures (HCC).   Surgical History:   Past Surgical History:  Procedure Laterality Date   BIOPSY  06/03/2022   Procedure: BIOPSY;  Surgeon: Jenel Lucks, MD;  Location: [facility] ENDOSCOPY;  Service: Gastroenterology;;   ESOPHAGOGASTRODUODENOSCOPY (EGD) WITH PROPOFOL N/A 06/03/2022   Procedure: ESOPHAGOGASTRODUODENOSCOPY (EGD) WITH PROPOFOL;  Surgeon: Jenel Lucks, MD;  Location[facility] ENDOSCOPY;  Service: Gastroenterology;  Laterality: N/A;   HOT  HEMOSTASIS N/A 06/03/2022   Procedure: HOT HEMOSTASIS (ARGON PLASMA COAGULATION/BICAP);  Surgeon: Jenel Lucks, MD;  Location: [facility] ENDOSCOPY;  Service: Gastroenterology;  Laterality: N/A;     Social History:   reports that she quit smoking about 10 years ago. Her smoking use included cigarettes. She has never used smokeless tobacco. She reports that she does not currently use alcohol after a past usage of about 49.0 standard drinks of alcohol per week. She reports that she does not use drugs.   Family History:  Her family history includes CAD in  her mother.   Allergies Allergies  Allergen Reactions   Losartan Potassium     Rash   Levaquin [Levofloxacin In D5w] Rash   Ace Inhibitors Cough    REACTION: cough   Codeine Nausea Only   Cefepime Rash    09/04/12 pm Patient started to break out with small macules after IV Vanco infusion, then macules increased in size after starting cefepime infusion.   Chlorhexidine Itching and Rash   Vancomycin Rash    09/04/12 pm Patient started to break out with small macules after IV Vanco infusion, then macules increased in size after starting cefepime infusion.     Home Medications  Prior to Admission medications   Medication Sig Start Date End Date Taking? Authorizing Provider  acetaminophen (TYLENOL) 500 MG tablet Take 2 tablets (1,000 mg total) by mouth every 8 (eight) hours as needed for moderate pain. 09/02/22 09/02/23  [name], DO  allopurinol (ZYLOPRIM) 100 MG tablet Take 2 tablets (200 mg total) by mouth daily. 09/02/22 09/02/23  [name], DO  amLODipine (NORVASC) 10 MG tablet TAKE 1 TABLET(10 MG) BY MOUTH DAILY Patient taking differently: Take 10 mg by mouth daily. TAKE 1 TABLET(10 MG) BY MOUTH DAILY 07/13/22   Rai, Ripudeep K, MD  Blood Glucose Monitoring Suppl (TRUE METRIX METER) DEVI 1 kit by Does not apply route 3 (three) times daily. CHECK BLOOD SUGAR UP TO 3 TIMES DAILY. E11.9 02/27/19   Hoy Register, MD   colchicine 0.6 MG tablet Take 1 tablet (0.6 mg total) by mouth as needed (Take 3 pills on first day of gout attack then take 2 per day as needed until attack ends). 09/02/22 09/02/23  [name], DO  diclofenac Sodium (VOLTAREN) 1 % GEL Apply 4 g topically as needed (Apply to skin as needed for joint pain). 09/02/22   [name], DO  ferrous sulfate 325 (65 FE) MG tablet Take 1 tablet (325 mg total) by mouth 2 (two) times daily with a meal. 06/05/22   Leroy Sea, MD  folic acid (FOLVITE) 1 MG tablet Take 1 tablet (1 mg total) by mouth daily. 06/05/22   Leroy Sea, MD  glucose blood (TRUE METRIX BLOOD GLUCOSE TEST) test strip Use as instructed 03/06/20   Hoy Register, MD  levETIRAcetam (KEPPRA) 500 MG tablet Take 1 tablet (500 mg total) by mouth 2 (two) times daily. 07/13/22 11/10/22  Rai, Ripudeep K, MD  lidocaine (LIDODERM) 5 % Place 1 patch onto the skin daily. Remove & Discard patch within 12 hours or as directed by MD. Apply to R knee Patient not taking: Reported on 07/26/2022 07/13/22   Rai, Delene Ruffini, MD  magnesium oxide (MAG-OX) 400 (240 Mg) MG tablet TAKE 1 TABLET BY MOUTH EVERY DAY Patient taking differently: Take 400 mg by mouth daily. 06/24/21   Hoy Register, MD  Multiple Vitamin (MULTIVITAMIN WITH MINERALS) TABS tablet Take 1 tablet by mouth daily.    [provider]  ondansetron (ZOFRAN-ODT) 4 MG disintegrating tablet Take 1 tablet (4 mg total) by mouth every 8 (eight) hours as needed for nausea or vomiting. 05/29/22   Rancour, Jeannett Senior, MD  pantoprazole (PROTONIX) 40 MG tablet Take 1 tablet (40 mg total) by mouth 2 (two) times daily before a meal. Patient taking differently: Take 40 mg by mouth 2 (two) times daily. 06/05/22   Leroy Sea, MD  thiamine (VITAMIN B-1) 100 MG tablet Take 1 tablet (100 mg total) by mouth daily. Patient not taking: Reported on 07/12/2022 07/28/20  Van Clines, MD  traMADol (ULTRAM) 50 MG tablet Take 1 tablet (50 mg  total) by mouth every 12 (twelve) hours as needed for severe pain. Patient not taking: Reported on 07/22/2022 07/12/22   Cathren Harsh, MD  TRUEplus Lancets 28G MISC SMARTSIG:Topical 1 to 3 Times Daily 03/06/20   Hoy Register, MD  vitamin B-12 (CYANOCOBALAMIN) 1000 MCG tablet Take 1 tablet (1,000 mcg total) by mouth daily. Patient not taking: Reported on 07/12/2022 07/28/20   Van Clines, MD  metFORMIN (GLUCOPHAGE) 500 MG tablet Take 0.5 tablets (250 mg total) by mouth daily with breakfast. 03/05/20 04/02/20  Anders Simmonds, PA-C     Critical care time: 

## 2022-09-27 NOTE — ED Provider Notes (Signed)
[place] EMERGENCY DEPARTMENT AT [facility] Provider Note   CSN: [id number] Arrival date & time: 09/27/22  2027     History {Add pertinent medical, surgical, social history, OB history to HPI:1} Chief Complaint  Patient presents with   Seizures    Christina Byrd is a 57 y.o. female.   Seizures    Patient has a history of reflux hypertension seizures alcohol use.  Patient presented to the ED for evaluation of seizures.  According to the EMS report patient's family reported patient has been drinking alcohol which usually triggers her seizures.  She had 2 seizures lasting approximately 20 seconds prior to them being called.  Patient was noted to have another 1 on route.  She was given midazolam 2.5 mg with resolution seizures.  In the ED patient is somnolent not answering any questions  Home Medications Prior to Admission medications   Medication Sig Start Date End Date Taking? Authorizing Provider  acetaminophen (TYLENOL) 500 MG tablet Take 2 tablets (1,000 mg total) by mouth every 8 (eight) hours as needed for moderate pain. 09/02/22 09/02/23  [name], DO  allopurinol (ZYLOPRIM) 100 MG tablet Take 2 tablets (200 mg total) by mouth daily. 09/02/22 09/02/23  [name], DO  amLODipine (NORVASC) 10 MG tablet TAKE 1 TABLET(10 MG) BY MOUTH DAILY Patient taking differently: Take 10 mg by mouth daily. TAKE 1 TABLET(10 MG) BY MOUTH DAILY 07/13/22   Rai, Ripudeep K, MD  Blood Glucose Monitoring Suppl (TRUE METRIX METER) DEVI 1 kit by Does not apply route 3 (three) times daily. CHECK BLOOD SUGAR UP TO 3 TIMES DAILY. E11.9 02/27/19   Hoy Register, MD  colchicine 0.6 MG tablet Take 1 tablet (0.6 mg total) by mouth as needed (Take 3 pills on first day of gout attack then take 2 per day as needed until attack ends). 09/02/22 09/02/23  [name], DO  diclofenac Sodium (VOLTAREN) 1 % GEL Apply 4 g topically as needed (Apply to skin as needed for joint pain).  09/02/22   [name], DO  ferrous sulfate 325 (65 FE) MG tablet Take 1 tablet (325 mg total) by mouth 2 (two) times daily with a meal. 06/05/22   Leroy Sea, MD  folic acid (FOLVITE) 1 MG tablet Take 1 tablet (1 mg total) by mouth daily. 06/05/22   Leroy Sea, MD  glucose blood (TRUE METRIX BLOOD GLUCOSE TEST) test strip Use as instructed 03/06/20   Hoy Register, MD  levETIRAcetam (KEPPRA) 500 MG tablet Take 1 tablet (500 mg total) by mouth 2 (two) times daily. 07/13/22 11/10/22  Rai, Ripudeep K, MD  lidocaine (LIDODERM) 5 % Place 1 patch onto the skin daily. Remove & Discard patch within 12 hours or as directed by MD. Apply to R knee Patient not taking: Reported on 07/26/2022 07/13/22   Rai, Delene Ruffini, MD  magnesium oxide (MAG-OX) 400 (240 Mg) MG tablet TAKE 1 TABLET BY MOUTH EVERY DAY Patient taking differently: Take 400 mg by mouth daily. 06/24/21   Hoy Register, MD  Multiple Vitamin (MULTIVITAMIN WITH MINERALS) TABS tablet Take 1 tablet by mouth daily.    [provider]  ondansetron (ZOFRAN-ODT) 4 MG disintegrating tablet Take 1 tablet (4 mg total) by mouth every 8 (eight) hours as needed for nausea or vomiting. 05/29/22   Rancour, Jeannett Senior, MD  pantoprazole (PROTONIX) 40 MG tablet Take 1 tablet (40 mg total) by mouth 2 (two) times daily before a meal. Patient taking differently: Take 40 mg  by mouth 2 (two) times daily. 06/05/22   Leroy Sea, MD  thiamine (VITAMIN B-1) 100 MG tablet Take 1 tablet (100 mg total) by mouth daily. Patient not taking: Reported on 07/12/2022 07/28/20   Van Clines, MD  traMADol (ULTRAM) 50 MG tablet Take 1 tablet (50 mg total) by mouth every 12 (twelve) hours as needed for severe pain. Patient not taking: Reported on 07/22/2022 07/12/22   Cathren Harsh, MD  TRUEplus Lancets 28G MISC SMARTSIG:Topical 1 to 3 Times Daily 03/06/20   Hoy Register, MD  vitamin B-12 (CYANOCOBALAMIN) 1000 MCG tablet Take 1 tablet (1,000 mcg total) by mouth  daily. Patient not taking: Reported on 07/12/2022 07/28/20   Van Clines, MD  metFORMIN (GLUCOPHAGE) 500 MG tablet Take 0.5 tablets (250 mg total) by mouth daily with breakfast. 03/05/20 04/02/20  Anders Simmonds, PA-C      Allergies    Losartan potassium, Levaquin [levofloxacin in d5w], Ace inhibitors, Codeine, Cefepime, Chlorhexidine, and Vancomycin    Review of Systems   Review of Systems  Neurological:  Positive for seizures.    Physical Exam Updated Vital Signs BP (!) 133/117   Pulse (!) 117   Temp 98.6 F (37 C) (Oral)   Resp 16   LMP 08/23/2011   SpO2 100%  Physical Exam Vitals and nursing note reviewed.  Constitutional:      Appearance: She is well-developed. She is ill-appearing.  HENT:     Head: Normocephalic and atraumatic.     Right Ear: External ear normal.     Left Ear: External ear normal.     Nose: No rhinorrhea.  Eyes:     General: No scleral icterus.       Right eye: No discharge.        Left eye: No discharge.     Conjunctiva/sclera: Conjunctivae normal.  Neck:     Trachea: No tracheal deviation.  Cardiovascular:     Rate and Rhythm: Regular rhythm. Tachycardia present.  Pulmonary:     Effort: Pulmonary effort is normal. No respiratory distress.     Breath sounds: Normal breath sounds. No stridor. No wheezing or rales.  Abdominal:     General: Bowel sounds are normal. There is no distension.     Palpations: Abdomen is soft.     Tenderness: There is no abdominal tenderness. There is no guarding or rebound.  Musculoskeletal:        General: No tenderness or deformity.     Cervical back: Neck supple.  Skin:    General: Skin is warm and dry.     Findings: No rash.  Neurological:     General: No focal deficit present.     Mental Status: She is alert.     Cranial Nerves: No facial asymmetry.     Motor: No abnormal muscle tone or seizure activity.     Comments: Patient withdrawing to pain, not answering any questions at the bedside  Psychiatric:         Mood and Affect: Mood normal.     ED Results / Procedures / Treatments   Labs (all labs ordered are listed, but only abnormal results are displayed) Labs Reviewed  COMPREHENSIVE METABOLIC PANEL - Abnormal; Notable for the following components:      Result Value   Potassium 3.1 (*)    CO2 8 (*)    Glucose, Bld 212 (*)    BUN 5 (*)    Creatinine, Ser 1.32 (*)  Calcium 7.8 (*)    Albumin 2.9 (*)    AST 49 (*)    GFR, Estimated 47 (*)    Anion gap 29 (*)    All other components within normal limits  CBC WITH DIFFERENTIAL/PLATELET - Abnormal; Notable for the following components:   RBC 3.49 (*)    Hemoglobin 10.4 (*)    HCT 33.3 (*)    RDW 17.2 (*)    All other components within normal limits  URINALYSIS, ROUTINE W REFLEX MICROSCOPIC - Abnormal; Notable for the following components:   Ketones, ur 5 (*)    Protein, ur 30 (*)    Bacteria, UA RARE (*)    All other components within normal limits  ETHANOL - Abnormal; Notable for the following components:   Alcohol, Ethyl (B) 11 (*)    All other components within normal limits  I-STAT CG4 LACTIC ACID, ED - Abnormal; Notable for the following components:   Lactic Acid, Venous >15.0 (*)    All other components within normal limits  I-STAT CHEM 8, ED - Abnormal; Notable for the following components:   Potassium 3.3 (*)    BUN <3 (*)    Creatinine, Ser 1.20 (*)    Glucose, Bld 221 (*)    Calcium, Ion 1.00 (*)    TCO2 12 (*)    All other components within normal limits  I-STAT VENOUS BLOOD GAS, ED - Abnormal; Notable for the following components:   pH, Ven 7.144 (*)    pCO2, Ven 30.7 (*)    pO2, Ven 60 (*)    Bicarbonate 10.6 (*)    TCO2 11 (*)    Acid-base deficit 17.0 (*)    Potassium 3.3 (*)    Calcium, Ion 1.01 (*)    All other components within normal limits  RAPID URINE DRUG SCREEN, HOSP PERFORMED  CBG MONITORING, ED  I-STAT CG4 LACTIC ACID, ED  TROPONIN I (HIGH SENSITIVITY)    EKG EKG  Interpretation Date/Time:  Monday September 27 2022 20:41:45 EDT Ventricular Rate:  160 PR Interval:  97 QRS Duration:  92 QT Interval:  333 QTC Calculation: 544 R Axis:   61  Text Interpretation: probable sinus tachcyardia Abnormal T, consider ischemia, lateral leads Minimal ST elevation, lateral leads Prolonged QT interval Confirmed by Linwood Dibbles [phone]) on 09/27/2022 8:49:35 PM  Radiology No results found.  Procedures .Critical Care  Performed by: Linwood Dibbles, MD Authorized by: Linwood Dibbles, MD   Critical care provider statement:    Critical care time (minutes):  30   Critical care was time spent personally by me on the following activities:  Development of treatment plan with patient or surrogate, discussions with consultants, evaluation of patient's response to treatment, examination of patient, ordering and review of laboratory studies, ordering and review of radiographic studies, ordering and performing treatments and interventions, pulse oximetry, re-evaluation of patient's condition and review of old charts   {Document cardiac monitor, telemetry assessment procedure when appropriate:1}  Medications Ordered in ED Medications  sodium chloride 0.9 % bolus 1,000 mL (1,000 mLs Intravenous New Bag/Given 09/27/22 2135)    And  0.9 %  sodium chloride infusion ( Intravenous New Bag/Given 09/27/22 2135)  LORazepam (ATIVAN) injection 2 mg (has no administration in time range)  potassium chloride 10 mEq in 100 mL IVPB (10 mEq Intravenous New Bag/Given 09/27/22 2228)  thiamine (VITAMIN B1) injection 100 mg (100 mg Intravenous Given 09/27/22 2136)  metoprolol tartrate (LOPRESSOR) injection 5 mg (5 mg Intravenous Given 09/27/22  2137)  levETIRAcetam (KEPPRA) IVPB 1000 mg/100 mL premix (0 mg Intravenous Stopped 09/27/22 2200)  LORazepam (ATIVAN) injection 2 mg (2 mg Intravenous Given 09/27/22 2143)    ED Course/ Medical Decision Making/ A&P Clinical Course as of 09/27/22 2255  Mon Sep 27, 2022  2133  Patient's laboratory test showed a venous blood glass with a decreased pH of 7.14.  pCO2 is not elevated consistent with a metabolic acidosis.  Likely related to the seizures. [JK]  2134 Alcohol level only slightly elevated at 11.  Suspicious for alcohol withdrawal seizures / DTs [JK]  2142 Heart rate improved down to 126 [JK]  2247 Case discussed with intensivist, Dr Gaynell Face.  Will see patient for admission [JK]    Clinical Course User Index [JK] Linwood Dibbles, MD   {   Click here for ABCD2, HEART and other calculatorsREFRESH Note before signing :1}                              Medical Decision Making Differential diagnosis includes but not limited to DTs alcohol withdrawal seizures status epilepticus  Problems Addressed: Delirium tremens [facility]): acute illness or injury that poses a threat to life or bodily functions  Amount and/or Complexity of Data Reviewed Labs: ordered. Radiology: ordered.  Risk Prescription drug management.   She presented to the ED for evaluation of altered mental status seizures.  Patient has history of alcohol use disorder.  Family states she been drinking for several days and not eating or taking her medications.  Patient presented altered on arrival.  Labs are notable for metabolic acidosis.  Patient also has a lactic acidosis likely related to her seizures.  No signs of infection at this time.  Continue with IV fluids and benzodiazepines.  Presentation consistent with delirium tremens.  Of consult with the critical care service for admission.  I will consult with neurology.  Head CT ordered to rule out any traumatic injury.  Will continue to monitor closely  {Document critical care time when appropriate:1} {Document review of labs and clinical decision tools ie heart score, Chads2Vasc2 etc:1}  {Document your independent review of radiology images, and any outside records:1} {Document your discussion with family members, caretakers, and with  consultants:1} {Document social determinants of health affecting pt's care:1} {Document your decision making why or why not admission, treatments were needed:1} Final Clinical Impression(s) / ED Diagnoses Final diagnoses:  Delirium tremens (HCC)    Rx / DC Orders ED Discharge Orders     None

## 2022-09-27 NOTE — ED Triage Notes (Signed)
Per EMS seizure activity x 2 prior to arrival lasting 20sec. Another in ambulance. Midazolam 2.5mg  given.

## 2022-09-28 ENCOUNTER — Encounter (HOSPITAL_COMMUNITY): Payer: BLUE CROSS/BLUE SHIELD

## 2022-09-28 DIAGNOSIS — G9340 Encephalopathy, unspecified: Secondary | ICD-10-CM

## 2022-09-28 DIAGNOSIS — R569 Unspecified convulsions: Secondary | ICD-10-CM

## 2022-09-28 DIAGNOSIS — F10931 Alcohol use, unspecified with withdrawal delirium: Secondary | ICD-10-CM | POA: Diagnosis not present

## 2022-09-28 LAB — BASIC METABOLIC PANEL
Anion gap: 10 (ref 5–15)
BUN: <5 mg/dL — ABNORMAL LOW (ref 6–20)
CO2: 27 mmol/L (ref 22–32)
Calcium: 7.2 mg/dL — ABNORMAL LOW (ref 8.9–10.3)
Chloride: 102 mmol/L (ref 98–111)
Creatinine, Ser: 0.81 mg/dL (ref 0.44–1.00)
GFR, Estimated: >60 mL/min (ref >60)
Glucose, Bld: 132 mg/dL — ABNORMAL HIGH (ref 70–99)
Potassium: 3.8 mmol/L (ref 3.5–5.1)
Sodium: 139 mmol/L (ref 135–145)

## 2022-09-28 LAB — MAGNESIUM
Magnesium: 2.8 mg/dL — ABNORMAL HIGH (ref 1.7–2.4)
Magnesium: 2.6 mg/dL — ABNORMAL HIGH (ref 1.7–2.4)

## 2022-09-28 LAB — GLUCOSE, CAPILLARY
Glucose-Capillary: 99 mg/dL (ref 70–99)
Glucose-Capillary: 67 mg/dL — ABNORMAL LOW (ref 70–99)
Glucose-Capillary: 161 mg/dL — ABNORMAL HIGH (ref 70–99)
Glucose-Capillary: 194 mg/dL — ABNORMAL HIGH (ref 70–99)
Glucose-Capillary: 205 mg/dL — ABNORMAL HIGH (ref 70–99)
Glucose-Capillary: 138 mg/dL — ABNORMAL HIGH (ref 70–99)
Glucose-Capillary: 139 mg/dL — ABNORMAL HIGH (ref 70–99)
Glucose-Capillary: 138 mg/dL — ABNORMAL HIGH (ref 70–99)

## 2022-09-28 LAB — MRSA NEXT GEN BY PCR, NASAL: MRSA by PCR Next Gen: NOT DETECTED

## 2022-09-28 LAB — LACTIC ACID, PLASMA: Lactic Acid, Venous: 1.3 mmol/L (ref 0.5–1.9)

## 2022-09-28 LAB — CK: Total CK: 384 U/L — ABNORMAL HIGH (ref 38–234)

## 2022-09-28 LAB — PROCALCITONIN: Procalcitonin: 0.31 ng/mL

## 2022-09-28 MED ORDER — MAGNESIUM SULFATE 4 GM/100ML IV SOLN
4.0000 g | Freq: Once | INTRAVENOUS | Status: AC
  Administered 2022-09-28: 4 g via INTRAVENOUS
  Filled 2022-09-28: qty 100

## 2022-09-28 MED ORDER — DEXMEDETOMIDINE HCL IN NACL 400 MCG/100ML IV SOLN
INTRAVENOUS | Status: AC
  Filled 2022-09-28: qty 100

## 2022-09-28 MED ORDER — DEXTROSE 5 % IV SOLN: INTRAVENOUS | Status: DC

## 2022-09-28 MED ORDER — ORAL CARE MOUTH RINSE: 15.0000 mL | OROMUCOSAL | Status: DC | PRN

## 2022-09-28 MED ORDER — FENTANYL CITRATE PF 50 MCG/ML IJ SOSY: 50.0000 ug | PREFILLED_SYRINGE | INTRAMUSCULAR | Status: DC | PRN

## 2022-09-28 MED ORDER — PHENOBARBITAL SODIUM 65 MG/ML IJ SOLN
65.0000 mg | Freq: Once | INTRAMUSCULAR | Status: AC
  Administered 2022-09-28: 65 mg via INTRAVENOUS
  Filled 2022-09-28: qty 1

## 2022-09-28 MED ORDER — ROCURONIUM BROMIDE 10 MG/ML (PF) SYRINGE
PREFILLED_SYRINGE | INTRAVENOUS | Status: AC
  Filled 2022-09-28: qty 10

## 2022-09-28 MED ORDER — MAGNESIUM SULFATE 2 GM/50ML IV SOLN
2.0000 g | Freq: Once | INTRAVENOUS | Status: AC
  Administered 2022-09-28: 2 g via INTRAVENOUS
  Filled 2022-09-28: qty 50

## 2022-09-28 MED ORDER — NOREPINEPHRINE 4 MG/250ML-% IV SOLN
2.0000 ug/min | INTRAVENOUS | Status: DC
  Administered 2022-09-28: 2 ug/min via INTRAVENOUS
  Filled 2022-09-28: qty 250

## 2022-09-28 MED ORDER — POTASSIUM PHOSPHATES 15 MMOLE/5ML IV SOLN
15.0000 mmol | Freq: Once | INTRAVENOUS | Status: AC
  Administered 2022-09-28: 15 mmol via INTRAVENOUS
  Filled 2022-09-28: qty 5

## 2022-09-28 MED ORDER — FENTANYL CITRATE PF 50 MCG/ML IJ SOSY
PREFILLED_SYRINGE | INTRAMUSCULAR | Status: AC
  Filled 2022-09-28: qty 1

## 2022-09-28 MED ORDER — LACTATED RINGERS IV BOLUS
1000.0000 mL | Freq: Once | INTRAVENOUS | Status: AC
  Administered 2022-09-28: 1000 mL via INTRAVENOUS

## 2022-09-28 MED ORDER — ORAL CARE MOUTH RINSE: 15.0000 mL | OROMUCOSAL | Status: DC

## 2022-09-28 MED ORDER — LEVETIRACETAM IN NACL 500 MG/100ML IV SOLN
500.0000 mg | Freq: Two times a day (BID) | INTRAVENOUS | Status: DC
  Administered 2022-09-28 – 2022-09-29 (×3): 500 mg via INTRAVENOUS
  Filled 2022-09-28 (×3): qty 100

## 2022-09-28 MED ORDER — ETOMIDATE 2 MG/ML IV SOLN
INTRAVENOUS | Status: AC
  Filled 2022-09-28: qty 20

## 2022-09-28 MED ORDER — DOCUSATE SODIUM 50 MG/5ML PO LIQD: 100.0000 mg | Freq: Two times a day (BID) | ORAL | Status: DC

## 2022-09-28 MED ORDER — SODIUM BICARBONATE 8.4 % IV SOLN
INTRAVENOUS | Status: AC
  Filled 2022-09-28: qty 50

## 2022-09-28 MED ORDER — ORAL CARE MOUTH RINSE
15.0000 mL | OROMUCOSAL | Status: DC
  Administered 2022-09-28 – 2022-09-30 (×8): 15 mL via OROMUCOSAL

## 2022-09-28 MED ORDER — POTASSIUM CHLORIDE 10 MEQ/100ML IV SOLN
10.0000 meq | INTRAVENOUS | Status: AC
  Administered 2022-09-28 (×2): 10 meq via INTRAVENOUS
  Filled 2022-09-28 (×2): qty 100

## 2022-09-28 MED ORDER — PROPOFOL 1000 MG/100ML IV EMUL
INTRAVENOUS | Status: AC
  Filled 2022-09-28: qty 100

## 2022-09-28 MED ORDER — ACETAMINOPHEN 650 MG RE SUPP
650.0000 mg | RECTAL | Status: DC | PRN
  Administered 2022-09-28: 650 mg via RECTAL
  Filled 2022-09-28: qty 1

## 2022-09-28 MED ORDER — PROPOFOL 1000 MG/100ML IV EMUL: 0.0000 ug/kg/min | INTRAVENOUS | Status: DC

## 2022-09-28 MED ORDER — DEXMEDETOMIDINE HCL IN NACL 400 MCG/100ML IV SOLN
0.0000 ug/kg/h | INTRAVENOUS | Status: DC
  Administered 2022-09-28: 0.4 ug/kg/h via INTRAVENOUS

## 2022-09-28 MED ORDER — DEXTROSE 50 % IV SOLN
12.5000 g | INTRAVENOUS | Status: AC
  Administered 2022-09-28: 12.5 g via INTRAVENOUS
  Filled 2022-09-28: qty 50

## 2022-09-28 MED ORDER — SODIUM CHLORIDE 0.9 % IV SOLN
250.0000 mL | INTRAVENOUS | Status: DC
  Administered 2022-09-28: 250 mL via INTRAVENOUS

## 2022-09-28 MED ORDER — ALBUTEROL SULFATE (2.5 MG/3ML) 0.083% IN NEBU: 2.5000 mg | INHALATION_SOLUTION | RESPIRATORY_TRACT | Status: DC | PRN

## 2022-09-28 MED ORDER — DEXTROSE IN LACTATED RINGERS 5 % IV SOLN: INTRAVENOUS | Status: DC

## 2022-09-28 MED ORDER — POLYETHYLENE GLYCOL 3350 17 G PO PACK: 17.0000 g | PACK | Freq: Every day | ORAL | Status: DC

## 2022-09-28 MED ORDER — FOLIC ACID 5 MG/ML IJ SOLN
1.0000 mg | Freq: Every day | INTRAMUSCULAR | Status: DC
  Administered 2022-09-28: 1 mg via INTRAVENOUS
  Filled 2022-09-28 (×2): qty 0.2

## 2022-09-28 MED ORDER — SUCCINYLCHOLINE CHLORIDE 200 MG/10ML IV SOSY
PREFILLED_SYRINGE | INTRAVENOUS | Status: AC
  Filled 2022-09-28: qty 10

## 2022-09-28 MED ORDER — INSULIN ASPART 100 UNIT/ML IJ SOLN: 0.0000 [IU] | INTRAMUSCULAR | Status: DC

## 2022-09-28 NOTE — Progress Notes (Signed)
An USGPIV (ultrasound guided PIV) has been placed for short-term vasopressor infusion. A correctly placed ivWatch must be used when administering Vasopressors. Should this treatment be needed beyond 24 hours, central line access should be obtained.  It will be the responsibility of the bedside nurse to follow best practice to prevent extravasations.

## 2022-09-28 NOTE — Progress Notes (Addendum)
eLink Physician-Brief Progress Note Patient Name: Christina Byrd DOB: 1965/10/17 MRN: [id number]   Date of Service  09/28/2022  HPI/Events of Note  Brief New admit note: 35 yr F with hx of seizure disorder, alcohol user  Having witnessed seizure  activity x 2 - EMS.  Data: reviewed K 3.1, getting replacement Cr 1.32 Hg 10 Alcohol 11, LA improving to 7.5 from earlier > 15 7.144/30/60/10.6 EKG: sinus tachy , qtc 544-prolonged UA neg Tox benzos +, received versed -en route Cth no acute changes Trop 17  Camera evaluation done: somnolent Sinus tachy at 112, on room air. Getting LR+ kcl and folic acid. Fever 102.  A/P:  Delirium tremens.  Seizures- etoh abuse. S/p keppra from ED. Electrolyte imbalance.  AKI/AGMA. Fever: wbc is ok. LA from seizures. UA neg. On room air. Watch for aspirational Pneumonitis.   - neuro consultation Asp and sz precautions -on thiamine, MVI-folate, to go on CIWA protocol - follow CK for any rhabdo. If elevated consider bicarb drip. - keep Mag and K over 2/4 respectively. - low thresh hold for intubation if getting recurring sz - s/p 2 bicarb . Follow ABG.    WUJ:WJXBJYN SQ.  CBG goals < 180. on    . Reatha Harps, MD, FCCP. [phone]   eICU Interventions       Intervention Category Major Interventions: Seizures - evaluation and management Evaluation Type: New Patient Evaluation    Ranee Gosselin 09/28/2022, 1:50 AM  03:39 pretty agitated. received ativan 2mg   0130 and 0245  wondering if we could try precedex?  Discussed with RN; - will avoid precedex due to qtc prolongation. Do not have CIWA phenobarbital protocol on epic. Ordered once phenobarbital IV. Get EKG at 4 AM, if qtc improving, could consider precedex   - hypoglycemia at 67. Geeting D50. Start D5 at 50 ml/hr.  04:48 AM Qtc normal. Discussed with RN, ongoing agitation, ativan not helping - start precedex drip.

## 2022-09-28 NOTE — Progress Notes (Addendum)
NAME:  Christina Byrd, MRN:  [id number], DOB:  [birth year], LOS: 1 ADMISSION DATE:  09/27/2022, CONSULTATION DATE:  09/27/22 REFERRING MD:  EDP, CHIEF COMPLAINT:  sz 2/2 acute etoh withdrawal   History of Present Illness:  57 yo female with known PMH of ETOH abuse, seizures related to ETOH withdrawal and seizures unrelated ETOH on Keprra, GERD, and HTN presenting from home with seizure activity.  Per report pt presented via ems after family witnesses sz like activity x2 lasting ~20 seconds each. Pt was noted to have one en route with EMS as well. Per family pt has been drinking again and not taking her medications.  Etoh level 11.  Pt remains quite somnolent but protecting airway so all history is provided by EDP and chart review.  PCCM was consulted for admission in light of pt's somnolence in post ictal and acute etoh withdrawal state.   Upon assessment pt was noted to be lethargic but arousable. Non verbal but opens eyes to voice and moves all 4 extremities to tactile stimuli. Tachycardic to 130's.  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  Neurology consulted.  Loaded with keppra in ER.   Pertinent  Medical History  Para March abuse Depression Gout   Significant Hospital Events: Including procedures, antibiotic start and stop dates in addition to other pertinent events   Admitted to ICU 8/5  Interim History / Subjective:  Started on precedex this am for worsening agitation/ CIWA scores despite 12mg  of ativan and 65mg  phenobarbital.  No reports of clinical seizures   Tmax 102.7, slight leukocytosis  Episode of hypoglycemia overnight CBG 190  Objective   Blood pressure (!) 140/102, pulse (!) 138, temperature (!) 101 F (38.3 C), temperature source Axillary, resp. rate 16, height 5\' 2"  (1.575 m), weight 54.8 kg, last menstrual period 08/23/2011, SpO2 99%.    FiO2 (%):  [28 %] 28 %   Intake/Output Summary  (Last 24 hours) at 09/28/2022 5621 Last data filed at 09/28/2022 0700 Gross per 24 hour  Intake 2187.6 ml  Output 450 ml  Net 1737.6 ml   Filed Weights   09/28/22 0500  Weight: 54.8 kg    Examination: Dex 1.2 General:  ill appearing older female lying in bed unresponsive HEENT: MM pink/minimally moist, lip chapped, pupils 2/sluggish, anicteric, no nuchal rigidity   Neuro:  unresponsive to sternal rub, some mouth movements with oral care, -gag CV: rr, NSR, rr  PULM:  non labored, clear anteriorly, shallow, on 2L at 100% GI: soft, bs hypo, some distention over suprapubic area, puriwick Extremities: warm/dry, no LE edema  Skin: no rashes  Resolved Hospital Problem list    Assessment & Plan:  Acute etoh withdrawal/ delirium tremens H/o etoh abuse Sz 2/2 above Acute toxic encephalopathy - UDS +benzo's (received with EMS), ETOH 11 on admit P:  - now unresponsive, precedex stopped, CBG reassuring.  Concern for subclinical seizures given some fluctuating hemodynamics and worsening mental status.  Not protecting airway.  Will proceed with intubation.  - Neuro at bedside> LTM ordered - recheck CK and lactate - will need MRI at some point - serial neuro exams  - AEDs per Neurology> keppra 500mg   BID  - thiamine, folate, MVI supplementation  - Seizure precautions  - Aspirations precautions  - ammonia wnl - Maintain neuro protective measures; goal for eurothermia, euglycemia, eunatermia, normoxia, and PCO2 goal of 35-40 with  Nutrition and bowel regiment  Hypotension Leukocytosis  - fluctuating hemodynamics concerning for underlying seizures - LR fluid bolus X1, may repeat if needed - peripheral NE for MAP goal > 65 - recheck lactic  - febrile and slight increase in leukocytosis.  UA neg.  Will check CXR after intubation.  Could be related to seizures, if ongoing.  Will check PCT as well.    - trend WBC/ fever curve    Acute respiratory insufficiency  - intubate now  for airway protection - full MV support,  4-8cc/kg IBW with goal Pplat <30 and DP<15  - VAP prevention protocol/ PPI - PAD protocol for sedation> given hypotension> may need versed vs propofol with prn fentanyl.   - post intubation  CXR/ ABG - wean FiO2 as able for SpO2 >92% - pulm hygiene - prn BD   Urinary retention 2/2 poor mental status  AKI  AGMA, improving lactic acidosis  P:  - foley for urinary retention - cont MIVF for now - improving sCr, monitor UOP closely  - trend renal indices  - strict I/Os, daily wts - avoid nephrotoxins, renal dose meds, hemodynamic support as above   Hypokalemia:  Hypophos Hypomagnesia  - ongoing aggressive replacement of electrolytes.   - recheck Mag/ phos this evening - stop KCL in IVF, replete w/ kphos.  S/p Mag replete   Hyperglycemia f/b hypoglycemia - stop D50 and SSI prn  - monitor CBGs closely, q4hrs and prn  - will add TF today   Prolonged QTc - cont tele monitoring - optimize electrolytes    HTN - hold norvasc  GERD - PPI   Chronic anemia  - stable H/H , no concern for bleeding - trend CBC  Best Practice (right click and "Reselect all SmartList Selections" daily)   Diet/type: NPO DVT prophylaxis: prophylactic heparin  GI prophylaxis: PPI Lines: N/A Foley:  N/A Code Status:  full code Last date of multidisciplinary goals of care discussion [pending family discussion or pt mentation]  Pending update 8/6> son, Kaylyn Milkey [phone], updated by phone and clinical change.  Labs   CBC: Recent Labs  Lab 09/27/22 2051 09/27/22 2116 09/27/22 2118 09/28/22 0435  WBC 9.4  --   --  12.8*  NEUTROABS 7.6  --   --   --   HGB 10.4* 12.2 12.2 9.9*  HCT 33.3* 36.0 36.0 29.5*  MCV 95.4  --   --  91.0  PLT 170  --   --  193    Basic Metabolic Panel: Recent Labs  Lab 09/27/22 2051 09/27/22 2116 09/27/22 2118 09/28/22 0435  NA 141 141 141 142  K 3.1* 3.3* 3.3* 3.5  CL 104  --  106 103  CO2 8*  --    --  24  GLUCOSE 212*  --  221* 155*  BUN 5*  --  <3* <5*  CREATININE 1.32*  --  1.20* 1.03*  CALCIUM 7.8*  --   --  7.4*  MG  --   --   --  1.0*  PHOS  --   --   --  2.1*   GFR: Estimated Creatinine Clearance: 47.7 mL/min (A) (by C-G formula based on SCr of 1.03 mg/dL (H)). Recent Labs  Lab 09/27/22 2051 09/27/22 2116 09/27/22 2313 09/28/22 0435  WBC 9.4  --   --  12.8*  LATICACIDVEN  --  >15.0* 7.8*  --     Liver Function Tests: Recent Labs  Lab 09/27/22 2051  AST 49*  ALT 17  ALKPHOS 70  BILITOT 0.6  PROT 6.6  ALBUMIN 2.9*   No results for input(s): "LIPASE", "AMYLASE" in the last 168 hours. Recent Labs  Lab 09/28/22 0435  AMMONIA 27    ABG    Component Value Date/Time   HCO3 10.6 (L) 09/27/2022 2116   TCO2 12 (L) 09/27/2022 2118   ACIDBASEDEF 17.0 (H) 09/27/2022 2116   O2SAT 83 09/27/2022 2116     Coagulation Profile: No results for input(s): "INR", "PROTIME" in the last 168 hours.  Cardiac Enzymes: No results for input(s): "CKTOTAL", "CKMB", "CKMBINDEX", "TROPONINI" in the last 168 hours.  HbA1C: HbA1c, POC (controlled diabetic range)  Date/Time Value Ref Range Status  03/24/2021 11:05 AM 5.5 0.0 - 7.0 % Final  11/17/2020 04:13 PM 5.4 0.0 - 7.0 % Final   Hgb A1c MFr Bld  Date/Time Value Ref Range Status  07/11/2022 04:03 AM 5.0 4.8 - 5.6 % Final    Comment:    (NOTE) Pre diabetes:          5.7%-6.4%  Diabetes:              >6.4%  Glycemic control for   <7.0% adults with diabetes   04/27/2022 11:25 AM 4.6 (L) 4.8 - 5.6 % Final    Comment:             Prediabetes: 5.7 - 6.4          Diabetes: >6.4          Glycemic control for adults with diabetes: <7.0     CBG: Recent Labs  Lab 09/28/22 0019 09/28/22 0121 09/28/22 0326 09/28/22 0403  GLUCAP 160* 99 67* 161*    Critical care time: 50 min        Posey Boyer, MSN, NP, AG-ACNP-BC  Pulmonary & Critical Care 09/28/2022, 7:33 AM  See Amion for pager If no  response to pager , please call [phone] until 7pm After 7:00 pm call Elink  [phone]

## 2022-09-28 NOTE — Consult Note (Signed)
NEURO HOSPITALIST CONSULT NOTE   Requestig physician: Dr. Gaynell Face  Reason for Consult: Seizures in the setting of EtOH withdrawal  History obtained from:  Chart     HPI:                                                                                                                                          Christina Byrd is an 57 y.o. female with a PMHx of seizures associated with EtOH withdrawal as well as seizures unrelated to EtOH use, HTN and GERD who presented to the ED via EMS on Monday night after seizure activity x 2, each lasting 20 seconds, followed by another en route. She was given midazolam 2.5 mg with resolution of seizures. In the ED the patient was somnolent and not answering any questions. Patient's family reported patient has been drinking alcohol which usually triggers her seizures. Her EtOH level was 11 in the ED. Vitals revealed a HR in the 170's. Her pH was low and lactate was elevated. She has been admitted to [facility] for management of EtOH withdrawal.   Past Medical History:  Diagnosis Date   GASTROESOPHAGEAL REFLUX, NO ESOPHAGITIS 04/21/2006   Qualifier: Diagnosis of  By: Levada Schilling     GERD (gastroesophageal reflux disease) Dx 1995   HTN (hypertension)    Seizures (HCC)     Past Surgical History:  Procedure Laterality Date   BIOPSY  06/03/2022   Procedure: BIOPSY;  Surgeon: Jenel Lucks, MD;  Location: [facility] ENDOSCOPY;  Service: Gastroenterology;;   ESOPHAGOGASTRODUODENOSCOPY (EGD) WITH PROPOFOL N/A 06/03/2022   Procedure: ESOPHAGOGASTRODUODENOSCOPY (EGD) WITH PROPOFOL;  Surgeon: Jenel Lucks, MD;  Location[facility] ENDOSCOPY;  Service: Gastroenterology;  Laterality: N/A;   HOT HEMOSTASIS N/A 06/03/2022   Procedure: HOT HEMOSTASIS (ARGON PLASMA COAGULATION/BICAP);  Surgeon: Jenel Lucks, MD;  Location: [facility] ENDOSCOPY;  Service: Gastroenterology;  Laterality: N/A;    Family History  Problem Relation Age of Onset   CAD Mother                Social History:  reports that she quit smoking about 10 years ago. Her smoking use included cigarettes. She has never used smokeless tobacco. She reports that she does not currently use alcohol after a past usage of about 49.0 standard drinks of alcohol per week. She reports that she does not use drugs.  Allergies  Allergen Reactions   Losartan Potassium     Rash   Levaquin [Levofloxacin In D5w] Rash   Ace Inhibitors Cough    REACTION: cough   Codeine Nausea Only   Cefepime Rash    09/04/12 pm Patient started to break out with small macules after IV Vanco infusion, then macules increased in size after starting cefepime infusion.   Chlorhexidine Itching  and Rash   Vancomycin Rash    09/04/12 pm Patient started to break out with small macules after IV Vanco infusion, then macules increased in size after starting cefepime infusion.    MEDICATIONS:                                                                                                                     Prior to Admission:  Medications Prior to Admission  Medication Sig Dispense Refill Last Dose   allopurinol (ZYLOPRIM) 100 MG tablet Take 2 tablets (200 mg total) by mouth daily. 60 tablet 11 09/24/2022 at am   fenofibrate (TRICOR) 48 MG tablet Take 48 mg by mouth daily.   09/24/2022   levETIRAcetam (KEPPRA) 500 MG tablet Take 1 tablet (500 mg total) by mouth 2 (two) times daily. 60 tablet 3 09/24/2022 at pm   pantoprazole (PROTONIX) 40 MG tablet Take 1 tablet (40 mg total) by mouth 2 (two) times daily before a meal. (Patient taking differently: Take 40 mg by mouth 2 (two) times daily.) 60 tablet 2 09/24/2022   acetaminophen (TYLENOL) 500 MG tablet Take 2 tablets (1,000 mg total) by mouth every 8 (eight) hours as needed for moderate pain. 100 tablet 2 UNKNOWN   amLODipine (NORVASC) 10 MG tablet TAKE 1 TABLET(10 MG) BY MOUTH DAILY (Patient taking differently: Take 10 mg by mouth daily. TAKE 1 TABLET(10 MG) BY MOUTH DAILY) 90 tablet 1  09/24/2022 at am   Blood Glucose Monitoring Suppl (TRUE METRIX METER) DEVI 1 kit by Does not apply route 3 (three) times daily. CHECK BLOOD SUGAR UP TO 3 TIMES DAILY. E11.9 1 each 0    colchicine 0.6 MG tablet Take 1 tablet (0.6 mg total) by mouth as needed (Take 3 pills on first day of gout attack then take 2 per day as needed until attack ends). 30 tablet 0 UNKNOWN   diclofenac Sodium (VOLTAREN) 1 % GEL Apply 4 g topically as needed (Apply to skin as needed for joint pain). 4 g 3 UNKNOWN   ferrous sulfate 325 (65 FE) MG tablet Take 1 tablet (325 mg total) by mouth 2 (two) times daily with a meal. 60 tablet 0 UNKNOWN   folic acid (FOLVITE) 1 MG tablet Take 1 tablet (1 mg total) by mouth daily. 30 tablet 0 UNKNOWN   glucose blood (TRUE METRIX BLOOD GLUCOSE TEST) test strip Use as instructed 100 each 12    magnesium oxide (MAG-OX) 400 (240 Mg) MG tablet TAKE 1 TABLET BY MOUTH EVERY DAY (Patient taking differently: Take 400 mg by mouth daily.) 90 tablet 0 UNKNOWN   Multiple Vitamin (MULTIVITAMIN WITH MINERALS) TABS tablet Take 1 tablet by mouth daily.   UNKNOWN   ondansetron (ZOFRAN-ODT) 4 MG disintegrating tablet Take 1 tablet (4 mg total) by mouth every 8 (eight) hours as needed for nausea or vomiting. 20 tablet 0 UNKNOWN   TRUEplus Lancets 28G MISC SMARTSIG:Topical 1 to 3 Times Daily 100 each 3    Scheduled:  heparin  5,000  Units Subcutaneous Q8H   insulin aspart  0-9 Units Subcutaneous Q4H   mouth rinse  15 mL Mouth Rinse 4 times per day   thiamine (VITAMIN B1) injection  100 mg Intravenous Daily   Continuous:  dexmedetomidine (PRECEDEX) IV infusion 0.7 mcg/kg/hr (09/28/22 0600)   dextrose 50 mL/hr at 09/28/22 0600   lactated ringers 1,000 mL with potassium chloride 40 mEq infusion 75 mL/hr at 09/28/22 0600   magnesium sulfate bolus IVPB 4 g (09/28/22 0600)   Followed by   magnesium sulfate bolus IVPB       ROS:                                                                                                                                        Unable to obtain as patient is currently agitated and nonverbal.    Blood pressure 117/78, pulse (!) 130, temperature (!) 102 F (38.9 C), temperature source Oral, resp. rate (!) 25, last menstrual period 08/23/2011, SpO2 100%.   General Examination:                                                                                                       Physical Exam  HEENT-  /AT    Lungs- Respirations unlabored Extremities- No edema   Neurological Examination Mental Status: Patient is currently agitated and nonverbal. No attempts to communicate. Not following any commands.  Cranial Nerves: II: PERRL Does not fixate or track III,IV, VI: Mild exotropia. No forced gaze deviation.   V: Noncooperative VII: Face is symmetric VIII: Unable to assess IX,X: Nonvocal XI: Head is midline XII: Not following commands for testing.  Motor/Sensory: Intermittently thrashes each limb without gross asymmetry. Briskly withdraws BLE to noxious plantar stimulation. Does not follow any motor commands.  Deep Tendon Reflexes: Unremarkable Cerebellar: Unable to assess Gait: Unable to assess     Lab Results: Basic Metabolic Panel: Recent Labs  Lab 09/27/22 2051 09/27/22 2116 09/27/22 2118  NA 141 141 141  K 3.1* 3.3* 3.3*  CL 104  --  106  CO2 8*  --   --   GLUCOSE 212*  --  221*  BUN 5*  --  <3*  CREATININE 1.32*  --  1.20*  CALCIUM 7.8*  --   --     CBC: Recent Labs  Lab 09/27/22 2051 09/27/22 2116 09/27/22 2118  WBC 9.4  --   --   NEUTROABS 7.6  --   --   HGB  10.4* 12.2 12.2  HCT 33.3* 36.0 36.0  MCV 95.4  --   --   PLT 170  --   --     Cardiac Enzymes: No results for input(s): "CKTOTAL", "CKMB", "CKMBINDEX", "TROPONINI" in the last 168 hours.  Lipid Panel: No results for input(s): "CHOL", "TRIG", "HDL", "CHOLHDL", "VLDL", "LDLCALC" in the last 168 hours.  Imaging: CT Head Wo Contrast  Result Date:  09/28/2022 CLINICAL DATA:  Seizure. EXAM: CT HEAD WITHOUT CONTRAST TECHNIQUE: Contiguous axial images were obtained from the base of the skull through the vertex without intravenous contrast. RADIATION DOSE REDUCTION: This exam was performed according to the departmental dose-optimization program which includes automated exposure control, adjustment of the mA and/or kV according to patient size and/or use of iterative reconstruction technique. COMPARISON:  Jun 25, 2022 FINDINGS: Brain: No evidence of acute infarction, hemorrhage, hydrocephalus, extra-axial collection or mass lesion/mass effect. 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 Vascular: No hyperdense vessel or unexpected calcification. Skull: Normal. Negative for fracture or focal lesion. Sinuses/Orbits: No acute finding. Other: None. IMPRESSION: 1. No acute intracranial pathology. 2. 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 Electronically Signed   By: Aram Candela M.D.   On: 09/28/2022 00:18     Assessment: 57 year old female presenting with EtOH withdrawal seizures - Exam reveals an agitated, nonverbal patient who thrashes her limbs intermittently without asymmetry. No clinical seizure activity seen.  - CT head: No acute intracranial pathology. 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  - She has been loaded with Keppra in the ED.  - Electrolyte derangements include hypomagnesemia, which may lower the seizure threshold.  - Leukocytosis on CBC - Overall presentation is most consistent with acute EtOH withdrawal, including EtOH withdrawal seizures, labile vitals and delirium tremens.   Recommendations: - Continue home Keppra at 500 mg IV BID - Management of DTs with CIWA and select sedatives per CCM.  - Management of hypoglycemia and electrolyte derangements per CCM - Agree with thiamine supplementation - EEG (ordered)   Electronically signed: Dr. Caryl Pina 09/28/2022,  2:06 AM

## 2022-09-28 NOTE — Progress Notes (Signed)
  Pt now awake, yelling out, moving extremities, protecting airway.  Will hold off on intubation at this time and monitor closely.   - keep NPO with aspiration precautions  LTM being placed.  Continue to monitor closely.   Son updated again by phone.       Posey Boyer, MSN, NP, AG-ACNP-BC [facility] 09/28/2022, 9:03 AM  See Amion for pager If no response to pager , please call [phone] until 7pm After 7:00 pm call Elink  [phone]

## 2022-09-28 NOTE — TOC CM/SW Note (Signed)
Transition of Care [facility]) - Inpatient Brief Assessment   Patient Details  Name: Christina Byrd MRN: [id number] Date of Birth: 12/27/1965  Transition of Care [facility]) CM/SW Contact:    Mearl Latin, LCSW Phone Number: 09/28/2022, 3:08 PM   Clinical Narrative: Patient admitted from home home. CSW acknowledges substance use consult but patient is currently disoriented. Resources placed on AVS and will monitor progress for needs.    Transition of Care Asessment: Insurance and Status: Insurance coverage has been reviewed Patient has primary care physician: Yes Home environment has been reviewed: From home Prior level of function:: Independent Prior/Current Home Services: No current home services Social Determinants of Health Reivew: SDOH reviewed no interventions necessary Readmission risk has been reviewed: Yes Transition of care needs: transition of care needs identified, TOC will continue to follow

## 2022-09-28 NOTE — Inpatient Diabetes Management (Signed)
Inpatient Diabetes Program Recommendations  AACE/ADA: New Consensus Statement on Inpatient Glycemic Control (2015)  Target Ranges:  Prepandial:   less than 140 mg/dL      Peak postprandial:   less than 180 mg/dL (1-2 hours)      Critically ill patients:  140 - 180 mg/dL   Lab Results  Component Value Date   GLUCAP 194 (H) 09/28/2022   HGBA1C 5.0 07/11/2022    Review of Glycemic Control  Latest Reference Range & Units 09/28/22 00:19 09/28/22 01:21 09/28/22 03:26 09/28/22 04:03 09/28/22 08:08  Glucose-Capillary 70 - 99 mg/dL 811 (H) 99 67 (L) 914 (H) 194 (H)   Diabetes history: None  Current orders for Inpatient glycemic control:  None  A1c 5% on 5/19  Inpatient Diabetes Program Recommendations:    -  May consider starting Novolog "very sensitive" scale, 0-6 units, to cover glucose trends starting at 150 mg/dl.   Thanks,  Christena Deem RN, MSN, BC-ADM Inpatient Diabetes Coordinator Team Pager [phone] (8a-5p)

## 2022-09-28 NOTE — Progress Notes (Signed)
Patient with high RAS score, tachycardic in 130-140 range, and unreceptive to reorientation strategies. Patient with agitation refractory to three Ativan 2 mg doses and one time dose of phenobarbital 65 mg. EKG performed to assess Qtc, as ordered by eLink MD. Requested Dexmedetomidine for relaxation and patient safety.

## 2022-09-28 NOTE — Progress Notes (Signed)
[facility] ADULT ICU REPLACEMENT PROTOCOL   The patient does apply for the [facility] Adult ICU Electrolyte Replacment Protocol based on the criteria listed below:   1.Exclusion criteria: TCTS, ECMO, Dialysis, and Myasthenia Gravis patients 2. Is GFR >/= 30 ml/min? Yes.    Patient's GFR today is >60 3. Is SCr </= 2? Yes.   Patient's SCr is 1.03 mg/dL 4. Did SCr increase >/= 0.5 in 24 hours? No. 5.Pt's weight >40kg  Yes.   6. Abnormal electrolyte(s): Mag 1  7. Electrolytes replaced per protocol 8.  Call MD STAT for K+ </= 2.5, Phos </= 1, or Mag </= 1 Physician:  Morene Crocker,  A 09/28/2022 5:42 AM

## 2022-09-28 NOTE — Discharge Instructions (Signed)
Seizure precautions: Per [facility] statutes, patients with seizures are not allowed to drive until they have been seizure-free for six months and cleared by a physician    Use caution when using heavy equipment or power tools. Avoid working on ladders or at heights. Take showers instead of baths. Ensure the water temperature is not too high on the home water heater. Do not go swimming alone. Do not lock yourself in a room alone (i.e. bathroom). When caring for infants or small children, sit down when holding, feeding, or changing them to minimize risk of injury to the child in the event you have a seizure. Maintain good sleep hygiene. Avoid alcohol.    If patient has another seizure, call 911 and bring them back to the ED if: A.  The seizure lasts longer than 5 minutes.      B.  The patient doesn't wake shortly after the seizure or has new problems such as difficulty seeing, speaking or moving following the seizure C.  The patient was injured during the seizure D.  The patient has a temperature over 102 F (39C) E.  The patient vomited during the seizure and now is having trouble breathing    During the Seizure   - First, ensure adequate ventilation and place patients on the floor on their left side  Loosen clothing around the neck and ensure the airway is patent. If the patient is clenching the teeth, do not force the mouth open with any object as this can cause severe damage - Remove all items from the surrounding that can be hazardous. The patient may be oblivious to what's happening and may not even know what he or she is doing. If the patient is confused and wandering, either gently guide him/her away and block access to outside areas - Reassure the individual and be comforting - Call 911. In most cases, the seizure ends before EMS arrives. However, there are cases when seizures may last over 3 to 5 minutes. Or the individual may have developed breathing difficulties or severe  injuries. If a pregnant patient or a person with diabetes develops a seizure, it is prudent to call an ambulance. - Finally, if the patient does not regain full consciousness, then call EMS. Most patients will remain confused for about 45 to 90 minutes after a seizure, so you must use judgment in calling for help. - Avoid restraints but make sure the patient is in a bed with padded side rails - Place the individual in a lateral position with the neck slightly flexed; this will help the saliva drain from the mouth and prevent the tongue from falling backward - Remove all nearby furniture and other hazards from the area - Provide verbal assurance as the individual is regaining consciousness - Provide the patient with privacy if possible - Call for help and start treatment as ordered by the caregiver    After the Seizure (Postictal Stage)   After a seizure, most patients experience confusion, fatigue, muscle pain and/or a headache. Thus, one should permit the individual to sleep. For the next few days, reassurance is essential. Being calm and helping reorient the person is also of importance.   Most seizures are painless and end spontaneously. Seizures are not harmful to others but can lead to complications such as stress on the lungs, brain and the heart. Individuals with prior lung problems may develop labored breathing and respiratory distress.           In a  time of Crisis: [facility]  Mobile Crisis Management provides immediate crisis response, 24/7.  Call [phone]  [facility] for MH/DD/[facility] is available 24 hours a day, 7 days a week. Customer Service Specialists will assist you to find a crisis provider that is well-matched with your needs. Your local number is: [phone]  [facility] ([facility]) IOP, individual counseling, medication management [address] [place], [place]  [address] [phone] Call for intake hours; Medicaid and Uninsured    Substance Use Outpatient Providers  [facility] ([facility]) Group and individual counseling. [address]  [place], [place] [address] [phone] [place]: [phone]  [place]: [phone] Medicaid and uninsured.   [facility] Offers IOP groups multiple times per week. [address] Sherian Maroon [place], [place] [address] [phone] Takes Medicaid and other insurances.   [facility]  Chemical Dependency Intensive Outpatient Program (IOP) [address] [place], [place] [address] [phone] Takes Nurse, learning disability and [place].   [facility]  IOP and Partial Hospitalization Program  [address]  [place], [place] [address] [phone] Private Insurance, [place] only for partial hospitalization  [facility] [address], [address], [place], [place] [address] [phone] Monday-Friday. Short and Long term options.  [facility] ([facility]) IOP, individual counseling, medication management [address] [place], [place] [address] [phone] Medicaid and [facility]  [facility] [address]  [place], [place] [address] [phone] Private Insurance and Self Pay   [facility] [address]  [place], [place] [address] [phone] Private Insurance, [place], and Self Pay   [facility]  [address] [place], [place] [address] [facility]  [address]  [place], [place] [address] [phone]  [facility]  [address] [place], [place] [address] [phone]      Residential Treatment Programs  [facility] [address] [place], [place] [address] [phone] or [phone] Detox and Residential Rehab 21  days (Medicaid, private insurance, and self pay. If Medicare, will look into funding). No methadone. Call for pre-screen.   [facility]) [address]  [place], [place] [address] [phone] Detox 3-7 days (self Pay and Medicaid Limited availability). Transitional Program for females needs 60 days clean first.  Rehab Only for Males (Medicare, Medicaid, and Self Pay)-No methadone.  [facility] [address] [place], [place] [address] [phone] or [phone] Private Insurance only  [facility] PHONE: [phone] FAX: [phone] Residential program for women 21 and over for up to a year through a Christian 12-step recovery model. Self-pay.    [facility] [address] [place] [address] [place], [place] [address] Phone:  [phone] Must be detoxed 72 hours prior to admission; 28 day program.  Self-pay.  [facility] [address]  [place], [place] [phone] [employer], Medicare, [place] (not straight [place]). They offer assistance with transportation.   [facility] [address] [place],  [place], [place] [address] [phone] Christian Based Program. Men only. No insurance  [employer] is a substance use disorder treatment program for women, including those who are pregnant, parenting, and/or whose lives have been touched by abuse and violence. [phone]  [facility] [address] [place], [place]  [address] Women's: [phone] Men's: [phone] No Medicaid.   [facility] [place] Locations across the [place] willing to help with transportation.  [phone] [employer]. [facility]  [address]  [place], [place] [address] [phone] Treatment Only, must make assessment appointment, and must be sober for assessment appointment. Self pay, [facility], must be  [facility] resident. No methadone.   [facility]  [address] [place], [place] [address] [phone] No pending legal charges, Long-term work program. No methadone. Call for assessment.  [facility]  [address], [place], [place] [address] [phone] or [phone] Commercial Insurance Only  [facility] - [phone] Private Insurance (no [place]). Males/Females, call to make referrals, multiple facilities.   [facility]: [facility] [address] [place], [place] [address] [phone]  [facility]-no methadone: [facility] [address] [place], [place] [address] [phone] Lacy Duverney [facility] [phone] [place], [place] For women, houses 8 residents for sober living. No Medicaid.         AA Meetings Website to locate meetings (virtually or in person): [email or web address] Phone: [phone]  Syringe Services Program: Due to COVID-19, syringe services programs are likely operating under different hours with limited or no fixed site hours. Some programs may not be operating at all. Please contact the program directly using the phone numbers provided below to see if they are still operating under COVID-19. [facility] Solution to the Opioid Problem (GCSTOP) Fixed; mobile; peer-based;Bobbye Riggs) [phone] [email or web address] Fixed site exchange at [facility], [address] [place], [place] [address] on Wednesdays (2:00 - 5:00 pm) and Thursdays (4:00 - 8:00 pm). Pop-up mobile exchange locations: [employer], [address] [place], [place] [address] on Tuesdays (11:00 am - 1:00 pm) and Fridays (11:00 am - 1:00 pm) -[facility] - [address], [place], [place] [address] on Tuesdays (2:00 - 4:00 pm) and Fridays (2:00 - 4:00 pm) -[employer] copy -  also serves Radio broadcast assistant and [employer];Fixed; mobile; peer-based; Lendon Ka [phone] [email or web address] [address], [place], [place] [address] Delivery and outreach available in [place] and [place], please call for more information. Monday, Tuesday: 1:00 -7:00 pm, Thursday: 4:00 pm - 8:00 pm, Friday: 1:00 pm - 8:00 pm)  Medication-Assisted Treatment (MAT):  -[facility]- services [address] and surrounding areas including [place], [place], [place], [place], [address], [place], [place], [place], [place], and [place], [place]. Options include Methadone, buprenorphine or Suboxone. [address], [place], [place] [address] Phone: [phone] Mon - Fri: 5:30am - 2:00pm Sat: 5:30am -7:30am Sun: Closed Holidays: 6:00am - 8:00am  -[facility]- We use FDA-approved medications, like methadone/suboxone/sublocade, and vivitrol. These medications are then combined with customized care plans that include individual or group counseling, toxicology, and medical care directed by on-site physicians. Accepts most insurance plans, Medicaid, and private pay.  [address] [place], [place] [address] Phone: [phone] Monday-Friday 5:00 AM - 10:00 AM Saturday 6:00 AM - 8:30 AM Sunday 6:00 AM - 7:00 AM  -[facility]- [facility] is a treatment & recovery focused program. In addition to receiving methadone medication, our clients participate in individual and group counseling as well as random drug testing. If accepted into the [facility] Opioid Program, you will be provided several intake appointments and a physical exam [address]  [address] [place], [place] [address] Office: [phone]  Fax: [phone]  -[facility]- We put our community members at the center of everything we do, for remote treatment services as well as in-person, from alcohol withdrawal to opioid use and more.  [address] [place]  [address], [place], [place] [address] [phone] Monday-Wednesday: 9:00am - 5:00pm Thursday: 9:00am - 6:00pm Friday: 9:00am - 5:00pm Saturday: 9:00am - 1:00pm Sunday: Closed  -[facility] [place]) [address], [place], [place] [address] [phone]  [place] [phone] [address] [place], [place] [address]  M-W    5:00am-12:00pm Thu     5:00am-10:00am Fri       5:00am-12:00pm Sat      5:00am-8:00am Sun     Closed  $12/daily for Methadone Treatment.

## 2022-09-28 NOTE — ED Notes (Signed)
ED TO INPATIENT HANDOFF REPORT  ED Nurse Name and Phone #: Arcelia Jew [phone]  S Name/Age/Gender Christina Byrd  57 y.o. female Room/Bed: 003C/003C  Code Status   Code Status: Full Code  Home/SNF/Other Home Patient oriented to: Nil Is this baseline? No   Triage Complete: Triage complete  Chief Complaint Seizure [facility]) [R56.9]  Triage Note Per EMS seizure activity x 2 prior to arrival lasting 20sec. Another in ambulance. Midazolam 2.5mg  given.   Allergies Allergies  Allergen Reactions   Losartan Potassium     Rash   Levaquin [Levofloxacin In D5w] Rash   Ace Inhibitors Cough    REACTION: cough   Codeine Nausea Only   Cefepime Rash    09/04/12 pm Patient started to break out with small macules after IV Vanco infusion, then macules increased in size after starting cefepime infusion.   Chlorhexidine Itching and Rash   Vancomycin Rash    09/04/12 pm Patient started to break out with small macules after IV Vanco infusion, then macules increased in size after starting cefepime infusion.    Level of Care/Admitting Diagnosis ED Disposition     ED Disposition  Admit   Condition  --   Comment  Hospital Area: [facility] [[id number]]  Level of Care: ICU [6]  May admit patient to [facility] or [facility] if equivalent level of care is available:: No  Covid Evaluation: Asymptomatic - no recent exposure (last 10 days) testing not required  Diagnosis: Seizure [facility]) [[id number]]  Admitting Physician: Briant Sites [[id number]]  Attending Physician: Briant Sites [phone]  Certification:: I certify this patient will need inpatient services for at least 2 midnights  Estimated Length of Stay: 3          B Medical/Surgery History Past Medical History:  Diagnosis Date   GASTROESOPHAGEAL REFLUX, NO ESOPHAGITIS 04/21/2006   Qualifier: Diagnosis of  By: Levada Schilling     GERD (gastroesophageal reflux disease) Dx 1995   HTN (hypertension)    Seizures (HCC)     Past Surgical History:  Procedure Laterality Date   BIOPSY  06/03/2022   Procedure: BIOPSY;  Surgeon: Jenel Lucks, MD;  Location: [facility] ENDOSCOPY;  Service: Gastroenterology;;   ESOPHAGOGASTRODUODENOSCOPY (EGD) WITH PROPOFOL N/A 06/03/2022   Procedure: ESOPHAGOGASTRODUODENOSCOPY (EGD) WITH PROPOFOL;  Surgeon: Jenel Lucks, MD;  Location: [facility] ENDOSCOPY;  Service: Gastroenterology;  Laterality: N/A;   HOT HEMOSTASIS N/A 06/03/2022   Procedure: HOT HEMOSTASIS (ARGON PLASMA COAGULATION/BICAP);  Surgeon: Jenel Lucks, MD;  Location: [facility] ENDOSCOPY;  Service: Gastroenterology;  Laterality: N/A;     A IV Location/Drains/Wounds Patient Lines/Drains/Airways Status     Active Line/Drains/Airways     Name Placement date Placement time Site Days   Peripheral IV 09/27/22 18 G Anterior;Left;Proximal Forearm 09/27/22  2034  Forearm  1            Intake/Output Last 24 hours  Intake/Output Summary (Last 24 hours) at 09/28/2022 0008 Last data filed at 09/27/2022 2347 Gross per 24 hour  Intake 700 ml  Output --  Net 700 ml    Labs/Imaging Results for orders placed or performed during the hospital encounter of 09/27/22 (from the past 48 hour(s))  Comprehensive metabolic panel     Status: Abnormal   Collection Time: 09/27/22  8:51 PM  Result Value Ref Range   Sodium 141 135 - 145 mmol/L   Potassium 3.1 (L) 3.5 - 5.1 mmol/L   Chloride 104 98 - 111 mmol/L   CO2 8 (  L) 22 - 32 mmol/L   Glucose, Bld 212 (H) 70 - 99 mg/dL    Comment: Glucose reference range applies only to samples taken after fasting for at least 8 hours.   BUN 5 (L) 6 - 20 mg/dL   Creatinine, Ser 0.98 (H) 0.44 - 1.00 mg/dL   Calcium 7.8 (L) 8.9 - 10.3 mg/dL   Total Protein 6.6 6.5 - 8.1 g/dL   Albumin 2.9 (L) 3.5 - 5.0 g/dL   AST 49 (H) 15 - 41 U/L   ALT 17 0 - 44 U/L   Alkaline Phosphatase 70 38 - 126 U/L   Total Bilirubin 0.6 0.3 - 1.2 mg/dL   GFR, Estimated 47 (L) >60 mL/min    Comment:  (NOTE) Calculated using the CKD-EPI Creatinine Equation (2021)    Anion gap 29 (H) 5 - 15    Comment: ELECTROLYTES REPEATED TO VERIFY Performed at [facility] Lab, [address], [place], [place] [address]   CBC with Differential/Platelet     Status: Abnormal   Collection Time: 09/27/22  8:51 PM  Result Value Ref Range   WBC 9.4 4.0 - 10.5 K/uL   RBC 3.49 (L) 3.87 - 5.11 MIL/uL   Hemoglobin 10.4 (L) 12.0 - 15.0 g/dL   HCT 78.2 (L) 95.6 - 21.3 %   MCV 95.4 80.0 - 100.0 fL   MCH 29.8 26.0 - 34.0 pg   MCHC 31.2 30.0 - 36.0 g/dL   RDW 08.6 (H) 57.8 - 46.9 %   Platelets 170 150 - 400 K/uL    Comment: REPEATED TO VERIFY   nRBC 0.0 0.0 - 0.2 %   Neutrophils Relative % 81 %   Neutro Abs 7.6 1.7 - 7.7 K/uL   Lymphocytes Relative 14 %   Lymphs Abs 1.4 0.7 - 4.0 K/uL   Monocytes Relative 4 %   Monocytes Absolute 0.4 0.1 - 1.0 K/uL   Eosinophils Relative 0 %   Eosinophils Absolute 0.0 0.0 - 0.5 K/uL   Basophils Relative 1 %   Basophils Absolute 0.1 0.0 - 0.1 K/uL   Immature Granulocytes 0 %   Abs Immature Granulocytes 0.04 0.00 - 0.07 K/uL    Comment: Performed at [facility] Lab, [address] [place], [place] [address]  Ethanol     Status: Abnormal   Collection Time: 09/27/22  8:51 PM  Result Value Ref Range   Alcohol, Ethyl (B) 11 (H) <10 mg/dL    Comment: (NOTE) Lowest detectable limit for serum alcohol is 10 mg/dL.  For medical purposes only. Performed at [facility], [address], [place], [place] [address]   Troponin I (High Sensitivity)     Status: None   Collection Time: 09/27/22  8:51 PM  Result Value Ref Range   Troponin I (High Sensitivity) 17 <18 ng/L    Comment: (NOTE) Elevated high sensitivity troponin I (hsTnI) values and significant  changes across serial measurements may suggest ACS but many other  chronic and acute conditions are known to elevate hsTnI results.  Refer to the "Links" section for chest pain algorithms and additional   guidance. Performed at [facility] Lab, [address] [place], [place] [address]   I-Stat CG4 Lactic Acid     Status: Abnormal   Collection Time: 09/27/22  9:16 PM  Result Value Ref Range   Lactic Acid, Venous >15.0 (HH) 0.5 - 1.9 mmol/L   Comment NOTIFIED PHYSICIAN   I-Stat venous blood gas, [facility] ED,  [facility], [facility])     Status: Abnormal   Collection Time: 09/27/22  9:16 PM  Result Value Ref Range   pH, Ven 7.144 (LL) 7.25 - 7.43   pCO2, Ven 30.7 (L) 44 - 60 mmHg   pO2, Ven 60 (H) 32 - 45 mmHg   Bicarbonate 10.6 (L) 20.0 - 28.0 mmol/L   TCO2 11 (L) 22 - 32 mmol/L   O2 Saturation 83 %   Acid-base deficit 17.0 (H) 0.0 - 2.0 mmol/L   Sodium 141 135 - 145 mmol/L   Potassium 3.3 (L) 3.5 - 5.1 mmol/L   Calcium, Ion 1.01 (L) 1.15 - 1.40 mmol/L   HCT 36.0 36.0 - 46.0 %   Hemoglobin 12.2 12.0 - 15.0 g/dL   Sample type VENOUS    Comment NOTIFIED PHYSICIAN   I-stat chem 8, ED (not at [facility], [facility] or [facility])     Status: Abnormal   Collection Time: 09/27/22  9:18 PM  Result Value Ref Range   Sodium 141 135 - 145 mmol/L   Potassium 3.3 (L) 3.5 - 5.1 mmol/L   Chloride 106 98 - 111 mmol/L   BUN <3 (L) 6 - 20 mg/dL   Creatinine, Ser 1.61 (H) 0.44 - 1.00 mg/dL   Glucose, Bld 096 (H) 70 - 99 mg/dL    Comment: Glucose reference range applies only to samples taken after fasting for at least 8 hours.   Calcium, Ion 1.00 (L) 1.15 - 1.40 mmol/L   TCO2 12 (L) 22 - 32 mmol/L   Hemoglobin 12.2 12.0 - 15.0 g/dL   HCT 04.5 40.9 - 81.1 %  Urinalysis, Routine w reflex microscopic -Urine, Catheterized     Status: Abnormal   Collection Time: 09/27/22 10:22 PM  Result Value Ref Range   Color, Urine YELLOW YELLOW   APPearance CLEAR CLEAR   Specific Gravity, Urine 1.013 1.005 - 1.030   pH 5.0 5.0 - 8.0   Glucose, UA NEGATIVE NEGATIVE mg/dL   Hgb urine dipstick NEGATIVE NEGATIVE   Bilirubin Urine NEGATIVE NEGATIVE   Ketones, ur 5 (A) NEGATIVE mg/dL   Protein, ur 30 (A) NEGATIVE mg/dL   Nitrite NEGATIVE  NEGATIVE   Leukocytes,Ua NEGATIVE NEGATIVE   RBC / HPF 0-5 0 - 5 RBC/hpf   WBC, UA 0-5 0 - 5 WBC/hpf   Bacteria, UA RARE (A) NONE SEEN   Squamous Epithelial / HPF 0-5 0 - 5 /HPF   Mucus PRESENT    Hyaline Casts, UA PRESENT     Comment: Performed at [facility] Lab, [address] [place], [place] [address]  Rapid urine drug screen (hospital performed)     Status: Abnormal   Collection Time: 09/27/22 10:22 PM  Result Value Ref Range   Opiates NONE DETECTED NONE DETECTED   Cocaine NONE DETECTED NONE DETECTED   Benzodiazepines POSITIVE (A) NONE DETECTED   Amphetamines NONE DETECTED NONE DETECTED   Tetrahydrocannabinol NONE DETECTED NONE DETECTED   Barbiturates NONE DETECTED NONE DETECTED    Comment: (NOTE) DRUG SCREEN FOR MEDICAL PURPOSES ONLY.  IF CONFIRMATION IS NEEDED FOR ANY PURPOSE, NOTIFY LAB WITHIN 5 DAYS.  LOWEST DETECTABLE LIMITS FOR URINE DRUG SCREEN Drug Class                     Cutoff (ng/mL) Amphetamine and metabolites    1000 Barbiturate and metabolites    200 Benzodiazepine                 200 Opiates  and metabolites        300 Cocaine and metabolites        300 THC                            50 Performed at [facility], [address], [place], [place] [address]   I-Stat CG4 Lactic Acid     Status: Abnormal   Collection Time: 09/27/22 11:13 PM  Result Value Ref Range   Lactic Acid, Venous 7.8 (HH) 0.5 - 1.9 mmol/L   Comment NOTIFIED PHYSICIAN    No results found.  Pending Labs Unresulted Labs (From admission, onward)     Start     Ordered   09/28/22 0500  CBC  Tomorrow morning,   R        09/27/22 2335   09/28/22 0500  Basic metabolic panel  Tomorrow morning,   R        09/27/22 2335   09/28/22 0500  Magnesium  Tomorrow morning,   R        09/27/22 2335   09/28/22 0500  Phosphorus  Tomorrow morning,   R        09/27/22 2335   09/28/22 0500  Ammonia  Tomorrow morning,   R        09/27/22 2339   09/27/22 2345  Sodium, urine, random   Once,   R        09/27/22 2345   09/27/22 2345  Protein / creatinine ratio, urine  Once,   R        09/27/22 2345   09/27/22 2334  CBC  (heparin)  Once,   R       Comments: Baseline for heparin therapy IF NOT ALREADY DRAWN.  Notify MD if PLT < 100 K.    09/27/22 2335   09/27/22 2334  Creatinine, serum  (heparin)  Once,   R       Comments: Baseline for heparin therapy IF NOT ALREADY DRAWN.    09/27/22 2335            Vitals/Pain Today's Vitals   09/27/22 2044 09/27/22 2136 09/27/22 2142 09/27/22 2230  BP: (!) 143/69 125/81  (!) 133/117  Pulse: (!) 176 (!) 155 (!) 129 (!) 117  Resp: (!) 28 18 (!) 24 16  Temp: 98.6 F (37 C)     TempSrc: Oral     SpO2: 97% 100% 100% 100%    Isolation Precautions No active isolations  Medications Medications  sodium chloride 0.9 % bolus 1,000 mL (1,000 mLs Intravenous New Bag/Given 09/27/22 2135)    And  0.9 %  sodium chloride infusion ( Intravenous New Bag/Given 09/27/22 2135)  potassium chloride 10 mEq in 100 mL IVPB (10 mEq Intravenous New Bag/Given 09/27/22 2348)  docusate sodium (COLACE) capsule 100 mg (has no administration in time range)  polyethylene glycol (MIRALAX / GLYCOLAX) packet 17 g (has no administration in time range)  heparin injection 5,000 Units (5,000 Units Subcutaneous Given 09/27/22 2346)  ondansetron (ZOFRAN) injection 4 mg (has no administration in time range)  insulin aspart (novoLOG) injection 0-9 Units (has no administration in time range)  LORazepam (ATIVAN) injection 1-2 mg (has no administration in time range)  thiamine (VITAMIN B1) injection 100 mg (has no administration in time range)  folic acid 1 mg in sodium chloride 0.9 % 50 mL IVPB (has no administration in time range)  lactated ringers 1,000 mL with  potassium chloride 40 mEq infusion (has no administration in time range)  sodium bicarbonate injection 100 mEq (has no administration in time range)  thiamine (VITAMIN B1) injection 100 mg (100 mg Intravenous  Given 09/27/22 2136)  metoprolol tartrate (LOPRESSOR) injection 5 mg (5 mg Intravenous Given 09/27/22 2137)  levETIRAcetam (KEPPRA) IVPB 1000 mg/100 mL premix (0 mg Intravenous Stopped 09/27/22 2200)  LORazepam (ATIVAN) injection 2 mg (2 mg Intravenous Given 09/27/22 2143)  LORazepam (ATIVAN) injection 2 mg (2 mg Intravenous Given 09/27/22 2347)    Mobility walks     Focused Assessments Neuro Assessment Handoff:  Swallow screen pass? No          Neuro Assessment: Exceptions to WDL Neuro Checks:      Has TPA been given? No If patient is a Neuro Trauma and patient is going to OR before floor call report to 4N Charge nurse: [phone] or [phone]   R Recommendations: See Admitting Provider Note  Report given to:   Additional Notes: Pt restless and pulling at her Iv. ANO X 0.

## 2022-09-28 NOTE — Progress Notes (Signed)
LTM EEG running - push button tested - neuro notified.

## 2022-09-28 NOTE — Progress Notes (Signed)
Fluid bolus and levophed started

## 2022-09-29 ENCOUNTER — Inpatient Hospital Stay (HOSPITAL_COMMUNITY): Payer: BLUE CROSS/BLUE SHIELD

## 2022-09-29 ENCOUNTER — Other Ambulatory Visit (HOSPITAL_COMMUNITY): Payer: Self-pay

## 2022-09-29 DIAGNOSIS — R569 Unspecified convulsions: Secondary | ICD-10-CM | POA: Diagnosis not present

## 2022-09-29 DIAGNOSIS — F10931 Alcohol use, unspecified with withdrawal delirium: Secondary | ICD-10-CM | POA: Diagnosis not present

## 2022-09-29 LAB — GLUCOSE, CAPILLARY
Glucose-Capillary: 131 mg/dL — ABNORMAL HIGH (ref 70–99)
Glucose-Capillary: 115 mg/dL — ABNORMAL HIGH (ref 70–99)
Glucose-Capillary: 89 mg/dL (ref 70–99)

## 2022-09-29 LAB — PROCALCITONIN: Procalcitonin: 0.34 ng/mL

## 2022-09-29 LAB — MAGNESIUM: Magnesium: 2.2 mg/dL (ref 1.7–2.4)

## 2022-09-29 MED ORDER — POTASSIUM PHOSPHATES 15 MMOLE/5ML IV SOLN
15.0000 mmol | Freq: Once | INTRAVENOUS | Status: AC
  Administered 2022-09-29: 15 mmol via INTRAVENOUS
  Filled 2022-09-29: qty 5

## 2022-09-29 MED ORDER — FOLIC ACID 1 MG PO TABS
1.0000 mg | ORAL_TABLET | Freq: Every day | ORAL | Status: DC
  Administered 2022-09-29 – 2022-09-30 (×2): 1 mg via ORAL
  Filled 2022-09-29 (×2): qty 1

## 2022-09-29 MED ORDER — OXYCODONE-ACETAMINOPHEN 5-325 MG PO TABS
1.0000 | ORAL_TABLET | ORAL | Status: DC | PRN
  Filled 2022-09-29: qty 1

## 2022-09-29 MED ORDER — POTASSIUM CHLORIDE CRYS ER 20 MEQ PO TBCR
40.0000 meq | EXTENDED_RELEASE_TABLET | Freq: Once | ORAL | Status: AC
  Administered 2022-09-29: 40 meq via ORAL
  Filled 2022-09-29: qty 2

## 2022-09-29 MED ORDER — LORAZEPAM 2 MG/ML IJ SOLN: 1.0000 mg | INTRAMUSCULAR | Status: DC | PRN

## 2022-09-29 MED ORDER — LEVETIRACETAM 500 MG PO TABS
500.0000 mg | ORAL_TABLET | Freq: Two times a day (BID) | ORAL | Status: DC
  Administered 2022-09-29 – 2022-09-30 (×2): 500 mg via ORAL
  Filled 2022-09-29 (×2): qty 1

## 2022-09-29 MED ORDER — THIAMINE MONONITRATE 100 MG PO TABS
100.0000 mg | ORAL_TABLET | Freq: Every day | ORAL | Status: DC
  Administered 2022-09-29 – 2022-09-30 (×2): 100 mg via ORAL
  Filled 2022-09-29 (×2): qty 1

## 2022-09-29 MED ORDER — POTASSIUM CHLORIDE 10 MEQ/100ML IV SOLN: 10.0000 meq | INTRAVENOUS | Status: DC

## 2022-09-29 MED ORDER — LEVETIRACETAM 500 MG PO TABS: 500.0000 mg | ORAL_TABLET | Freq: Two times a day (BID) | ORAL | Status: DC

## 2022-09-29 MED ORDER — ALLOPURINOL 100 MG PO TABS
100.0000 mg | ORAL_TABLET | Freq: Every day | ORAL | Status: DC
  Administered 2022-09-29 – 2022-09-30 (×2): 100 mg via ORAL
  Filled 2022-09-29 (×2): qty 1

## 2022-09-29 NOTE — Progress Notes (Signed)
Maint done, no skin breakdown at f8, t8, p8

## 2022-09-29 NOTE — Progress Notes (Signed)
LTM EEG discontinued - no skin breakdown at unhook.   

## 2022-09-29 NOTE — TOC Benefit Eligibility Note (Signed)
Patient Product/process development scientist completed.    The patient is insured through [facility] & [employer]     Ran test claim for Valtoco 15 mg and the current 30 day co-pay is $0.00.   This test claim was processed through [facility]- copay amounts may vary at other pharmacies due to pharmacy/plan contracts, or as the patient moves through the different stages of their insurance plan.     Roland Earl, CPHT Pharmacy Patient Advocate Specialist [facility] Patient Advocate Team Direct Number: [phone]  Fax: [phone]

## 2022-09-29 NOTE — Progress Notes (Signed)
NAME:  Christina Byrd, MRN:  [id number], DOB:  1965/12/15, LOS: 2 ADMISSION DATE:  09/27/2022, CONSULTATION DATE:  09/27/22 REFERRING MD:  EDP, CHIEF COMPLAINT:  sz 2/2 acute etoh withdrawal   History of Present Illness:  57 yo female with known PMH of ETOH abuse, seizures related to ETOH withdrawal and seizures unrelated ETOH on Keprra, GERD, and HTN presenting from home with seizure activity.  Per report pt presented via ems after family witnesses sz like activity x2 lasting ~20 seconds each. Pt was noted to have one en route with EMS as well. Per family pt has been drinking again and not taking her medications.  Etoh level 11.  Pt remains quite somnolent but protecting airway so all history is provided by EDP and chart review.  PCCM was consulted for admission in light of pt's somnolence in post ictal and acute etoh withdrawal state.   Upon assessment pt was noted to be lethargic but arousable. Non verbal but opens eyes to voice and moves all 4 extremities to tactile stimuli. Tachycardic to 130's.  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  Neurology consulted.  Loaded with keppra in ER.   Pertinent  Medical History  Para March abuse Depression Gout   Significant Hospital Events: Including procedures, antibiotic start and stop dates in addition to other pertinent events   Admitted to ICU 8/5 8/6 Started on precedex  for worsening agitation/ CIWA scores despite 12mg  of ativan and 65mg  phenobarbital >> obtunded in am but improved with time, febrile 1027  Interim History / Subjective:     Defervesced overnight. Did not require more doses of Ativan. On 2 L nasal cannula Started on low-dose Levophed  Objective   Blood pressure 115/72, pulse 84, temperature 98.1 F (36.7 C), temperature source Oral, resp. rate (!) 21, height 5\' 2"  (1.575 m), weight 54.9 kg, last menstrual period 08/23/2011, SpO2 100%.    FiO2  (%):  [28 %] 28 %   Intake/Output Summary (Last 24 hours) at 09/29/2022 0809 Last data filed at 09/29/2022 0600 Gross per 24 hour  Intake 2446.41 ml  Output 4265 ml  Net -1818.59 ml   Filed Weights   09/28/22 0500 09/29/22 0500  Weight: 54.8 kg 54.9 kg    Examination:  General: Acutely ill-appearing, lying in bed, no distress HEENT: MM pink/minimally moist, mild pallor, no icterus, no meningeal signs Neuro: Alert, interactive, nonfocal CV: rr, NSR, rr  PULM: No accessory muscle use, clear breath sounds bilateral GI: Soft, nontender Extremities: warm/dry, no LE edema  Skin: no rashes   Labs show mild hypokalemia 3.6, Phos 2.2, low procalcitonin, no leukocytosis, stable anemia Lactate decreased to 1.3  Resolved Hospital Problem list    Acute toxic encephalopathy  Assessment & Plan:  Acute etoh withdrawal/ delirium tremens H/o etoh abuse- ammonia wnl Sz 2/2 above - UDS +benzo's (received with EMS), ETOH 11 on admit P:  - - AEDs per Neurology> keppra 500mg   BID, can switch to oral -DC LTM EEG if okay with neurology - thiamine, folate, MVI supplementation  - Seizure precautions  - Aspirations precautions  -Continue CIWA protocol   Hypotension, resolved off pressors Leukocytosis , resolved Lactate normalized likely related to seizures HTN - hold norvasc  Urinary retention 2/2 poor mental status  AKI  AGMA, improving  P:  Resolved, hypophosphatemia repleted Hypomagnesemia repleted    Prolonged QTc - cont tele monitoring - optimize electrolytes   GERD -  PPI   Chronic anemia  - stable H/H , no concern for bleeding - trend CBC  Can transfer to floor  Best Practice (right click and "Reselect all SmartList Selections" daily)   Diet/type: Regular consistency (see orders) DVT prophylaxis: prophylactic heparin  GI prophylaxis: PPI Lines: N/A Foley:  N/A Code Status:  full code Last date of multidisciplinary goals of care discussion [pending family  discussion or pt mentation]  8/6> son, Kimbery Kietzman [phone], updated by phone and clinical change.  Labs   CBC: Recent Labs  Lab 09/27/22 2051 09/27/22 2116 09/27/22 2118 09/28/22 0435 09/29/22 0544  WBC 9.4  --   --  12.8* 8.9  NEUTROABS 7.6  --   --   --   --   HGB 10.4* 12.2 12.2 9.9* 10.8*  HCT 33.3* 36.0 36.0 29.5* 32.6*  MCV 95.4  --   --  91.0 92.9  PLT 170  --   --  193 170    Basic Metabolic Panel: Recent Labs  Lab 09/27/22 2051 09/27/22 2116 09/27/22 2118 09/28/22 0435 09/28/22 1742 09/28/22 1944 09/29/22 0544  NA 141 141 141 142  --  139 138  K 3.1* 3.3* 3.3* 3.5  --  3.8 3.6  CL 104  --  106 103  --  102 99  CO2 8*  --   --  24  --  27 27  GLUCOSE 212*  --  221* 155*  --  132* 114*  BUN 5*  --  <3* <5*  --  <5* <5*  CREATININE 1.32*  --  1.20* 1.03*  --  0.81 0.83  CALCIUM 7.8*  --   --  7.4*  --  7.2* 7.8*  MG  --   --   --  1.0* 2.8* 2.6* 2.2  PHOS  --   --   --  2.1*  --   --  2.2*   GFR: Estimated Creatinine Clearance: 59.1 mL/min (by C-G formula based on SCr of 0.83 mg/dL). Recent Labs  Lab 09/27/22 2051 09/27/22 2116 09/27/22 2313 09/28/22 0435 09/28/22 0824 09/29/22 0544  PROCALCITON  --   --   --  0.31  --  0.34  WBC 9.4  --   --  12.8*  --  8.9  LATICACIDVEN  --  >15.0* 7.8*  --  1.3  --     Liver Function Tests: Recent Labs  Lab 09/27/22 2051 09/29/22 0544  AST 49*  --   ALT 17  --   ALKPHOS 70  --   BILITOT 0.6  --   PROT 6.6  --   ALBUMIN 2.9* 2.6*   No results for input(s): "LIPASE", "AMYLASE" in the last 168 hours. Recent Labs  Lab 09/28/22 0435  AMMONIA 27    ABG    Component Value Date/Time   HCO3 10.6 (L) 09/27/2022 2116   TCO2 12 (L) 09/27/2022 2118   ACIDBASEDEF 17.0 (H) 09/27/2022 2116   O2SAT 83 09/27/2022 2116     Coagulation Profile: No results for input(s): "INR", "PROTIME" in the last 168 hours.  Cardiac Enzymes: Recent Labs  Lab 09/28/22 0824  CKTOTAL 384*    HbA1C: HbA1c, POC  (controlled diabetic range)  Date/Time Value Ref Range Status  03/24/2021 11:05 AM 5.5 0.0 - 7.0 % Final  11/17/2020 04:13 PM 5.4 0.0 - 7.0 % Final   Hgb A1c MFr Bld  Date/Time Value Ref Range Status  07/11/2022 04:03 AM 5.0 4.8 - 5.6 % Final  Comment:    (NOTE) Pre diabetes:          5.7%-6.4%  Diabetes:              >6.4%  Glycemic control for   <7.0% adults with diabetes   04/27/2022 11:25 AM 4.6 (L) 4.8 - 5.6 % Final    Comment:             Prediabetes: 5.7 - 6.4          Diabetes: >6.4          Glycemic control for adults with diabetes: <7.0     CBG: Recent Labs  Lab 09/28/22 1531 09/28/22 1921 09/28/22 2316 09/29/22 0314 09/29/22 0715  GLUCAP 138* 139* 138* 131* 115*    Cyril Mourning MD. FCCP. [facility] Pager : [phone]  If no response to pager , please call [phone] until 7 pm After 7:00 pm call Elink  [phone]   09/29/2022

## 2022-09-29 NOTE — Progress Notes (Addendum)
Subjective: NAEO. Reports pain in left shoulder and right ankle. Husband, brother and brother in law at bedside. Reports alcohol use  ROS: negative except above  Examination  Vital signs in last 24 hours: Temp:  [98.1 F (36.7 C)-98.8 F (37.1 C)] 98.6 F (37 C) (08/07 1158) Pulse Rate:  [30-111] 94 (08/07 1100) Resp:  [6-30] 15 (08/07 1100) BP: (81-139)/(32-103) 125/78 (08/07 1100) SpO2:  [80 %-100 %] 100 % (08/07 1100) FiO2 (%):  [28 %] 28 % (08/07 0400) Weight:  [54.9 kg] 54.9 kg (08/07 0500)  General: lying in bed, NAD Neuro: MS: Alert, oriented, follows commands CN: pupils equal and reactive,  EOMI, face symmetric, tongue midline, normal sensation over face, Motor: antigravity strength in all 4 extremities except LUE and RLE limited by pain Coordination: normal Gait: not tested  Basic Metabolic Panel: Recent Labs  Lab 09/27/22 2051 09/27/22 2116 09/27/22 2118 09/28/22 0435 09/28/22 1742 09/28/22 1944 09/29/22 0544  NA 141 141 141 142  --  139 138  K 3.1* 3.3* 3.3* 3.5  --  3.8 3.6  CL 104  --  106 103  --  102 99  CO2 8*  --   --  24  --  27 27  GLUCOSE 212*  --  221* 155*  --  132* 114*  BUN 5*  --  <3* <5*  --  <5* <5*  CREATININE 1.32*  --  1.20* 1.03*  --  0.81 0.83  CALCIUM 7.8*  --   --  7.4*  --  7.2* 7.8*  MG  --   --   --  1.0* 2.8* 2.6* 2.2  PHOS  --   --   --  2.1*  --   --  2.2*    CBC: Recent Labs  Lab 09/27/22 2051 09/27/22 2116 09/27/22 2118 09/28/22 0435 09/29/22 0544  WBC 9.4  --   --  12.8* 8.9  NEUTROABS 7.6  --   --   --   --   HGB 10.4* 12.2 12.2 9.9* 10.8*  HCT 33.3* 36.0 36.0 29.5* 32.6*  MCV 95.4  --   --  91.0 92.9  PLT 170  --   --  193 170     Coagulation Studies: No results for input(s): "LABPROT", "INR" in the last 72 hours.  Imaging CTH wo contrast 09/27/2022: No acute intracranial pathology. 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  ASSESSMENT AND PLAN: 57 year old female with h/o epilepsy  presenting with breakthrough seizure in the setting of alcohol use.  Epilepsy with breakthrough seizure Alcohol use disorder with alcohol withdrawal - continue Keppra 500mg  BID - rescue : intranasal valtoco 15mg  for seizure lasting over 2 minutes - Alcohol cessation counselling - Discussed importance of medication adherence and seizure provoking factors like alcohol use, lack of sleep, infections - seizure precautions - PRN IV versed for clinical seizures while impatient - F/u with Dr Karel Jarvis in 8-12 weeks  Seizure precautions: Per [facility] statutes, patients with seizures are not allowed to drive until they have been seizure-free for six months and cleared by a physician    Use caution when using heavy equipment or power tools. Avoid working on ladders or at heights. Take showers instead of baths. Ensure the water temperature is not too high on the home water heater. Do not go swimming alone. Do not lock yourself in a room alone (i.e. bathroom). When caring for infants or small children, sit down when holding, feeding,  or changing them to minimize risk of injury to the child in the event you have a seizure. Maintain good sleep hygiene. Avoid alcohol.    If patient has another seizure, call 911 and bring them back to the ED if: A.  The seizure lasts longer than 5 minutes.      B.  The patient doesn't wake shortly after the seizure or has new problems such as difficulty seeing, speaking or moving following the seizure C.  The patient was injured during the seizure D.  The patient has a temperature over 102 F (39C) E.  The patient vomited during the seizure and now is having trouble breathing    During the Seizure   - First, ensure adequate ventilation and place patients on the floor on their left side  Loosen clothing around the neck and ensure the airway is patent. If the patient is clenching the teeth, do not force the mouth open with any object as this can cause severe damage -  Remove all items from the surrounding that can be hazardous. The patient may be oblivious to what's happening and may not even know what he or she is doing. If the patient is confused and wandering, either gently guide him/her away and block access to outside areas - Reassure the individual and be comforting - Call 911. In most cases, the seizure ends before EMS arrives. However, there are cases when seizures may last over 3 to 5 minutes. Or the individual may have developed breathing difficulties or severe injuries. If a pregnant patient or a person with diabetes develops a seizure, it is prudent to call an ambulance. - Finally, if the patient does not regain full consciousness, then call EMS. Most patients will remain confused for about 45 to 90 minutes after a seizure, so you must use judgment in calling for help.    After the Seizure (Postictal Stage)   After a seizure, most patients experience confusion, fatigue, muscle pain and/or a headache. Thus, one should permit the individual to sleep. For the next few days, reassurance is essential. Being calm and helping reorient the person is also of importance.   Most seizures are painless and end spontaneously. Seizures are not harmful to others but can lead to complications such as stress on the lungs, brain and the heart. Individuals with prior lung problems may develop labored breathing and respiratory distress.   I have spent a total of  37  minutes with the patient reviewing hospital notes,  test results, labs and examining the patient as well as establishing an assessment and plan that was discussed personally with the patient and family.  > 50% of time was spent in direct patient care.    Lindie Spruce Epilepsy [facility] For questions after 5pm please refer to AMION to reach the Neurologist on call

## 2022-09-29 NOTE — Procedures (Signed)
Patient Name: Christina Byrd  MRN: [id number]  Epilepsy Attending: Charlsie Quest  Referring Physician/Provider: Elmer Picker, NP  Duration: 09/28/2022 4010 to 09/29/2022 2725  Patient history: 57 y.o. female with a PMHx of seizures associated with EtOH withdrawal as well as seizures unrelated to EtOH use, HTN and GERD who presented to the ED via EMS on Monday night after seizure activity x 2, each lasting 20 seconds, followed by another en route. EEG to evaluate for seizure.  Level of alertness: Awake, asleep  AEDs during EEG study: LEV, Ativan  Technical aspects: This EEG study was done with scalp electrodes positioned according to the 10-20 International system of electrode placement. Electrical activity was reviewed with band pass filter of 1-70Hz , sensitivity of 7 uV/mm, display speed of 45mm/sec with a 60Hz  notched filter applied as appropriate. EEG data were recorded continuously and digitally stored.  Video monitoring was available and reviewed as appropriate.  Description: The posterior dominant rhythm consists of 10 Hz activity of moderate voltage (25-35 uV) seen predominantly in posterior head regions, symmetric and reactive to eye opening and eye closing. Sleep was characterized by vertex waves, sleep spindles (12 to 14 Hz), maximal frontocentral region. EEG showed continuous generalized 3 to 6 Hz theta-delta slowing admixed with an excessive amount of 15 to 18 Hz beta activity distributed symmetrically and diffusely. Hyperventilation and photic stimulation were not performed.     ABNORMALITY - Continuous slow, generalized - Excessive beta, generalized  IMPRESSION: This study is suggestive of moderate diffuse encephalopathy, nonspecific etiology. The excessive beta activity seen in the background is most likely due to the effect of benzodiazepine and is a benign EEG pattern. No seizures or definite epileptiform discharges were seen throughout the recording.    Annabelle Harman

## 2022-09-30 MED ORDER — VALTOCO 15 MG DOSE 7.5 MG/0.1ML NA LQPK: 15.0000 mg | NASAL | 0 refills | Status: DC | PRN

## 2022-09-30 MED ORDER — GABAPENTIN 300 MG PO CAPS
300.0000 mg | ORAL_CAPSULE | Freq: Once | ORAL | Status: AC
  Administered 2022-09-30: 300 mg via ORAL
  Filled 2022-09-30 (×2): qty 1

## 2022-09-30 NOTE — Progress Notes (Signed)
Physical Therapy Evaluation Patient Details Name: CIMBERLY URESTI MRN: [id number] DOB: 1966/02/09 Today's Date: 09/30/2022  History of Present Illness  57 yo female presenting 8/5 with seizures and EtOH withdrawal was admitted to ICU, now more recovered and referred to rehab.  Pt has PMHx:  anemia, EtOH abuse, gout, R knee OA, HTN, GI bleed, CKD3  Clinical Impression  Pt was seen for mobility on RW in room after obtaining a walker for pain reduction on R foot.  Pt is anticipating a lot of equipment being available to her, and is demonstrating awareness of safety with RW.  Will progress as her R foot tolerates, and asking for home services to follow up as needed with geography of home and to progress strengthening.  Follow for goals of PT as are outlined below.        If plan is discharge home, recommend the following: Assistance with cooking/housework;Help with stairs or ramp for entrance   Can travel by private vehicle        Equipment Recommendations None recommended by PT  Recommendations for Other Services       Functional Status Assessment       Precautions / Restrictions Precautions Precautions: Fall Restrictions Weight Bearing Restrictions: No Other Position/Activity Restrictions: pain to WB on R foot      Mobility  Bed Mobility Overal bed mobility: Modified Independent                  Transfers Overall transfer level: Modified independent                      Ambulation/Gait Ambulation/Gait assistance: Supervision Gait Distance (Feet): 30 Feet Assistive device: Rolling walker (2 wheels) Gait Pattern/deviations: Step-through pattern, Decreased stance time - right, Decreased stride length, Wide base of support Gait velocity: reduced Gait velocity interpretation: <1.31 ft/sec, indicative of household ambulator Pre-gait activities: standing balance ck General Gait Details: pt is using RW for minimizing pressure on R foot, less wb time on  RLE  Stairs            Wheelchair Mobility     Tilt Bed    Modified Rankin (Stroke Patients Only)       Balance Overall balance assessment: Needs assistance Sitting-balance support: Feet supported Sitting balance-Leahy Scale: Good       Standing balance-Leahy Scale: Fair Standing balance comment: fair and using walker to steady dynamically                             Pertinent Vitals/Pain Pain Assessment Pain Assessment: Faces Faces Pain Scale: Hurts little more Pain Location: R foot Pain Descriptors / Indicators: Aching, Guarding Pain Intervention(s): Limited activity within patient's tolerance, Monitored during session, Premedicated before session, Repositioned    Home Living Family/patient expects to be discharged to:: Private residence Living Arrangements: Spouse/significant other Available Help at Discharge: Family;Available 24 hours/day Type of Home: House Home Access: Stairs to enter Entrance Stairs-Rails: Right;Left;Can reach both Entrance Stairs-Number of Steps: 3-4   Home Layout: One level Home Equipment: Agricultural consultant (2 wheels);Shower seat;Crutches      Prior Function Prior Level of Function : Independent/Modified Independent             Mobility Comments: no falls, I gait with no AD       Extremity/Trunk Assessment   Upper Extremity Assessment Upper Extremity Assessment: Overall WFL for tasks assessed    Lower Extremity Assessment Lower  Extremity Assessment: RLE deficits/detail RLE Deficits / Details: pain and edema R foot RLE Coordination: decreased gross motor       Communication   Communication Communication: No apparent difficulties  Cognition Arousal: Alert Behavior During Therapy: WFL for tasks assessed/performed Overall Cognitive Status: Within Functional Limits for tasks assessed                                          General Comments General comments (skin integrity, edema,  etc.): Pt was assisted to use RW with observed set up and fit but then was supervision level with gait in her room    Exercises     Assessment/Plan    PT Assessment    PT Problem List         PT Treatment Interventions      PT Goals (Current goals can be found in the Care Plan section)  Acute Rehab PT Goals Patient Stated Goal: to get R foot managed for pain PT Goal Formulation: With patient Time For Goal Achievement: 10/14/22 Potential to Achieve Goals: Good    Frequency Min 1X/week     Co-evaluation               AM-PAC PT "6 Clicks" Mobility  Outcome Measure Help needed turning from your back to your side while in a flat bed without using bedrails?: None Help needed moving from lying on your back to sitting on the side of a flat bed without using bedrails?: A Little Help needed moving to and from a bed to a chair (including a wheelchair)?: A Little Help needed standing up from a chair using your arms (e.g., wheelchair or bedside chair)?: A Little Help needed to walk in hospital room?: A Little Help needed climbing 3-5 steps with a railing? : A Lot 6 Click Score: 18    End of Session Equipment Utilized During Treatment: Gait belt Activity Tolerance: Treatment limited secondary to medical complications (Comment);Patient limited by pain Patient left: in bed;with call bell/phone within reach Nurse Communication: Mobility status PT Visit Diagnosis: Pain;Other abnormalities of gait and mobility (R26.89) Pain - Right/Left: Right Pain - part of body: Ankle and joints of foot    Time: 5284-1324 PT Time Calculation (min) (ACUTE ONLY): 28 min   Charges:   PT Evaluation $PT Eval Moderate Complexity: 1 Mod PT Treatments $Gait Training: 8-22 mins PT General Charges $$ ACUTE PT VISIT: 1 Visit        Ivar Drape 09/30/2022, 2:16 PM  Samul Dada, PT PhD Acute Rehab Dept. Number: [facility] [id number] and [facility] [phone]

## 2022-09-30 NOTE — Hospital Course (Signed)
57 yo female with known PMH of ETOH abuse, seizures related to ETOH withdrawal and seizures unrelated ETOH on Keprra, GERD, and HTN presenting from home with seizure activity.  pt presented via ems after family witnesses sz like activity x2 lasting ~20 seconds each. Pt was noted to have one en route with EMS as well. Per family pt has been drinking again and not taking her medications.  Etoh level 11.  Pt remains quite somnolent but protecting airway so all history is provided by EDP and chart review. PCCM was consulted for admission in light of pt's somnolence in post ictal and acute etoh withdrawal state.  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  Neurology consulted.  Loaded with keppra in ER and admitted in ICU 8/6> placed on Precedex for worsening agitation/CIWA score despite 20 mg of Ativan and 65 phenobarbital, after in the morning but improved with time, had episode of fever. Also had hypotension that resolved off pressors leukocytosis resolved lactic acid normalized at urine retention AKI which also resolved, noted to have normocytic anemia likely from chronic disease alcohol use.  --- Epilepsy with breakthrough seizure Alcohol abuse/alcohol withdrawal: Seen by neurology.  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  Managed in ICU with LTM EEG suggesting moderate diffuse encephalopathy nonspecific etiology no seizure or definitive) discharges seen, excessive beta generalized type seen.  Seen by epileptologist and plan is to continue Keppra 500 mg twice daily Rescue : intranasal valtoco 15mg  for seizure lasting over 2 minutes Cont w/ alcohol cessation counselling, medication compliance avoid triggers like lack of sleep alcohol use USE  PRN IV versed for clinical seizures while impatient He will F/u with Dr Karel Jarvis in 8-12 weeks.

## 2022-09-30 NOTE — Progress Notes (Signed)
PT Cancellation Note  Patient Details Name: Christina Byrd MRN: [id number] DOB: [birth year]   Cancelled Treatment:    Reason Eval/Treat Not Completed: Other (comment).  Pt in bed, has been medicated and declining to get up.  No reason specified but family in and report she has not been up to walk yet.  Revisit as time and pt allow.   Ivar Drape 09/30/2022, 10:39 AM  Samul Dada, PT PhD Acute Rehab Dept. Number: [facility] [id number] and [facility] [phone]

## 2022-09-30 NOTE — Discharge Summary (Signed)
Physician Discharge Summary       Patient ID: Christina Byrd MRN: [id number] DOB/AGE: 03/29/1965 57 y.o.  Admit date: 09/27/2022 Discharge date: 09/30/2022  Discharge Diagnoses:   Acute etoh withdrawal/ delirium tremens H/o etoh abuse- ammonia wnl Sz 2/2 above Hypotension, resolved off pressors Leukocytosis , resolved Lactate normalized likely related to seizures HTN Urinary retention 2/2 poor mental status  AKI  AGMA, improving Resolved, hypophosphatemia repleted Hypomagnesemia repleted Prolonged QTc GERD Chronic anemia   Discharge summary   57 yo female with known PMH of ETOH abuse, seizures related to ETOH withdrawal and seizures unrelated ETOH on Keprra, GERD, and HTN presenting from home with seizure activity.  Per report pt presented via ems after family witnesses sz like activity x2 lasting ~20 seconds each. Pt was noted to have one en route with EMS as well. Per family pt has been drinking again and not taking her medications.  Etoh level 11.  Pt remains quite somnolent but protecting airway so all history is provided by EDP and chart review.   PCCM was consulted for admission in light of pt's somnolence in post ictal and acute etoh withdrawal state.    Upon assessment pt was noted to be lethargic but arousable. Non verbal but opens eyes to voice and moves all 4 extremities to tactile stimuli. Tachycardic to 130's.  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  Neurology consulted.  Loaded with keppra in ER.  Patient was connected to LTM with no seizure activity seen. Neurology was consulted and recommendations made for breakthrough seizure in the setting of alcohol use. Medical she remains stable and back to baseline mentation by 8/8 therefore she was discharged home.   Discharge Plan by Active Problems   Epilepsy with breakthrough seizure Alcohol withdrawal with delirium tremens in the setting of  alcohol use disorder - UDS +benzo's (received with EMS), ETOH 11 on admit P:  Seen by neurology during admission LTM in place during admission with no seizure activity seen Continue Keppra 500 mg twice daily Rescue intranasal Valtoco 15 mg for seizure activity lasting 2 mg upon discharge Alcohol cessation education provided Follow-up with Dr. Karel Jarvis 8 to 12 weeks from discharge   Essential hypertension P:  Continue home Norvasc upon discharge  AKI Creatinine day of discharge 1.21 with GFR 57 P:  GERD P: Continue PPI  Chronic anemia P: Follow-up with PCP for management  Problems that were resolved during admission: Hypotension Leukocytosis  Lactic acidosis Urinary retention AGMA, improving Hypophosphatemia Hypomagnesemia Prolonged QTc   Significant Hospital tests/ studies  None  Procedures   None  Culture data/antimicrobials   None   Consults  Neurology    Discharge Exam: BP 94/66 (BP Location: Left Arm)   Pulse (!) 116   Temp 98.4 F (36.9 C)   Resp 18   Ht 5\' 2"  (1.575 m)   Wt 54.9 kg   LMP 08/23/2011   SpO2 100%   BMI 22.14 kg/m   General: Acute on chronically ill appearing elderly female siting up on edge of bed, in NAD HEENT: [place]/AT, MM pink/moist, PERRL,  Neuro: Alert and oriented x3, non-focal  CV: s1s2 regular rate and rhythm, no murmur, rubs, or gallops,  PULM:  Clear to auscultation, no increased work of breathing, no added breath sounds  GI: soft, bowel sounds active in all 4 quadrants, non-tender, non-distended, tolerating oral diet Extremities: warm/dry, no edema  Skin: no rashes or lesions  Labs at discharge   Lab Results  Component Value Date   CREATININE 1.12 (H) 09/30/2022   BUN 6 09/30/2022   NA 139 09/30/2022   K 4.2 09/30/2022   CL 105 09/30/2022   CO2 24 09/30/2022   Lab Results  Component Value Date   WBC 8.7 09/30/2022   HGB 9.4 (L) 09/30/2022   HCT 28.8 (L) 09/30/2022   MCV 91.7 09/30/2022   PLT 128 (L)  09/30/2022   Lab Results  Component Value Date   ALT 17 09/27/2022   AST 49 (H) 09/27/2022   ALKPHOS 70 09/27/2022   BILITOT 0.6 09/27/2022   Lab Results  Component Value Date   INR 1.1 07/13/2022   INR 1.2 07/12/2022   INR 1.4 (H) 07/11/2022    Current radiological studies    DG Foot Complete Right  Result Date: 09/29/2022 CLINICAL DATA:  Seizure, right foot pain EXAM: RIGHT FOOT COMPLETE - 3+ VIEW COMPARISON:  None Available. FINDINGS: Irregularity noted within the medial navicular bone. 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 Lucency within the adjacent medial navicular may reflect subchondral cyst. No visible fracture, subluxation or dislocation. Soft tissues are intact. IMPRESSION: No visible acute bony abnormality. Irregularity within the medial navicular felt to reflect a combination of large os and underlying subchondral cyst. This could be further evaluated with nonemergent MRI. Electronically Signed   By: Charlett Nose M.D.   On: 09/29/2022 18:41   DG Shoulder Left  Result Date: 09/29/2022 CLINICAL DATA:  Seizure.  Left shoulder pain EXAM: LEFT SHOULDER - 2+ VIEW COMPARISON:  03/24/2021 FINDINGS: Degenerative changes in the [facility] joint with joint space narrowing and spurring. Glenohumeral joint is maintained. No acute bony abnormality. Specifically, no fracture, subluxation, or dislocation. Soft tissues are intact. IMPRESSION: Mild degenerative changes in the left AC joint. No acute bony abnormality. Electronically Signed   By: Charlett Nose M.D.   On: 09/29/2022 18:29   Overnight EEG with video  Result Date: 09/29/2022 Charlsie Quest, MD     09/29/2022  9:35 AM Patient Name: Christina Byrd MRN: [id number] Epilepsy Attending: Charlsie Quest Referring Physician/Provider: Elmer Picker, NP Duration: 09/28/2022 1914 to 09/29/2022 7829 Patient history: 57 y.o. female with a PMHx of seizures associated with EtOH withdrawal as well as seizures unrelated to EtOH use, HTN and GERD who  presented to the ED via EMS on Monday night after seizure activity x 2, each lasting 20 seconds, followed by another en route. EEG to evaluate for seizure. Level of alertness: Awake, asleep AEDs during EEG study: LEV, Ativan Technical aspects: This EEG study was done with scalp electrodes positioned according to the 10-20 International system of electrode placement. Electrical activity was reviewed with band pass filter of 1-70Hz , sensitivity of 7 uV/mm, display speed of 62mm/sec with a 60Hz  notched filter applied as appropriate. EEG data were recorded continuously and digitally stored.  Video monitoring was available and reviewed as appropriate. Description: The posterior dominant rhythm consists of 10 Hz activity of moderate voltage (25-35 uV) seen predominantly in posterior head regions, symmetric and reactive to eye opening and eye closing. Sleep was characterized by vertex waves, sleep spindles (12 to 14 Hz), maximal frontocentral region. EEG showed continuous generalized 3 to 6 Hz theta-delta slowing admixed with an excessive amount of 15 to 18 Hz beta activity distributed symmetrically and diffusely. Hyperventilation and photic stimulation were not performed.   ABNORMALITY - Continuous slow, generalized - Excessive beta, generalized IMPRESSION: This study is suggestive  of moderate diffuse encephalopathy, nonspecific etiology. The excessive beta activity seen in the background is most likely due to the effect of benzodiazepine and is a benign EEG pattern. No seizures or definite epileptiform discharges were seen throughout the recording. Priyanka Annabelle Harman    Disposition:        Allergies as of 09/30/2022       Reactions   Losartan Potassium    Rash   Levaquin [levofloxacin In D5w] Rash   Ace Inhibitors Cough   REACTION: cough   Codeine Nausea Only   Cefepime Rash   09/04/12 pm Patient started to break out with small macules after IV Vanco infusion, then macules increased in size after starting  cefepime infusion.   Chlorhexidine Itching, Rash   Vancomycin Rash   09/04/12 pm Patient started to break out with small macules after IV Vanco infusion, then macules increased in size after starting cefepime infusion.        Medication List     TAKE these medications    acetaminophen 500 MG tablet Commonly known as: TYLENOL Take 2 tablets (1,000 mg total) by mouth every 8 (eight) hours as needed for moderate pain.   allopurinol 100 MG tablet Commonly known as: Zyloprim Take 2 tablets (200 mg total) by mouth daily.   amLODipine 10 MG tablet Commonly known as: NORVASC TAKE 1 TABLET(10 MG) BY MOUTH DAILY What changed:  how much to take how to take this when to take this   colchicine 0.6 MG tablet Take 1 tablet (0.6 mg total) by mouth as needed (Take 3 pills on first day of gout attack then take 2 per day as needed until attack ends).   diclofenac Sodium 1 % Gel Commonly known as: VOLTAREN Apply 4 g topically as needed (Apply to skin as needed for joint pain).   fenofibrate 48 MG tablet Commonly known as: TRICOR Take 48 mg by mouth daily.   ferrous sulfate 325 (65 FE) MG tablet Take 1 tablet (325 mg total) by mouth 2 (two) times daily with a meal.   folic acid 1 MG tablet Commonly known as: FOLVITE Take 1 tablet (1 mg total) by mouth daily.   levETIRAcetam 500 MG tablet Commonly known as: KEPPRA Take 1 tablet (500 mg total) by mouth 2 (two) times daily.   magnesium oxide 400 (240 Mg) MG tablet Commonly known as: MAG-OX TAKE 1 TABLET BY MOUTH EVERY DAY   multivitamin with minerals Tabs tablet Take 1 tablet by mouth daily.   ondansetron 4 MG disintegrating tablet Commonly known as: ZOFRAN-ODT Take 1 tablet (4 mg total) by mouth every 8 (eight) hours as needed for nausea or vomiting.   pantoprazole 40 MG tablet Commonly known as: PROTONIX Take 1 tablet (40 mg total) by mouth 2 (two) times daily before a meal. What changed: when to take this   True Metrix  Blood Glucose Test test strip Generic drug: glucose blood Use as instructed   True Metrix Meter Devi 1 kit by Does not apply route 3 (three) times daily. CHECK BLOOD SUGAR UP TO 3 TIMES DAILY. E11.9   TRUEplus Lancets 28G Misc SMARTSIG:Topical 1 to 3 Times Daily   Valtoco 15 MG Dose 7.5 MG/0.1ML Lqpk Generic drug: diazePAM (15 MG Dose) Place 15 mg into the nose as needed.         Follow-up appointment   PCP Neurologist  Discharge Condition:    good   Signed:  D. Harris 09/30/2022, 4:06 PM

## 2022-09-30 NOTE — Progress Notes (Signed)
Pt complained of throbbing bilat leg pain. Score 10. Pt said she never had the pain before today. Pt said her "seizure Dr" advised she can't take tylenol, aleve, Advil, or ibuprofen. When assessed at shift change, pt only complained about numbness and tingling in RLE, which she said she's had since she's been in the hospital. I didn't see any previous notes about the numbness and tingling. Notified Dr. Wynona Neat per above. Dr. Lenna Gilford percocet but pt refused. Dr. then ordered gabapentin. Pt requested to take gabapentin at a later time. Pt stated she doesn't take capsule pills because she almost choked so mixed powder form med in applesauce and gave before shift change. Bed alarm set, call bell in reach.

## 2022-10-01 ENCOUNTER — Telehealth: Payer: Self-pay

## 2022-10-01 NOTE — Transitions of Care (Post Inpatient/ED Visit) (Unsigned)
   10/01/2022  Name: Christina Byrd MRN: [id number] DOB: March 15, 1965  Today's TOC FU Call Status: Today's TOC FU Call Status:: Unsuccessful Call (1st Attempt) Unsuccessful Call (1st Attempt) Date: 10/01/22  Attempted to reach the patient regarding the most recent Inpatient/ED visit.  Follow Up Plan: Additional outreach attempts will be made to reach the patient to complete the Transitions of Care (Post Inpatient/ED visit) call.   Signature Karena Addison, LPN [facility] Nurse Health Advisor Direct Dial [phone]

## 2022-10-04 NOTE — Transitions of Care (Post Inpatient/ED Visit) (Signed)
10/04/2022  Name: Christina Byrd MRN: [id number] DOB: 1965/09/27  Today's TOC FU Call Status: Today's TOC FU Call Status:: Successful TOC FU Call Completed Unsuccessful Call (1st Attempt) Date: 10/01/22 [facility] FU Call Complete Date: 10/04/22  Transition Care Management Follow-up Telephone Call Date of Discharge: 09/30/22 Discharge Facility: [facility]) Type of Discharge: Inpatient Admission Primary Inpatient Discharge Diagnosis:: alcohol use How have you been since you were released from the hospital?: Better Any questions or concerns?: No  Items Reviewed: Did you receive and understand the discharge instructions provided?: Yes Medications obtained,verified, and reconciled?: Yes (Medications Reviewed) Any new allergies since your discharge?: No Dietary orders reviewed?: Yes Do you have support at home?: No  Medications Reviewed Today: Medications Reviewed Today     Reviewed by Karena Addison, LPN (Licensed Practical Nurse) on 10/04/22 at 1630  Med List Status: <None>   Medication Order Taking? Sig Documenting Provider Last Dose Status Informant  acetaminophen (TYLENOL) 500 MG tablet [id number] No Take 2 tablets (1,000 mg total) by mouth every 8 (eight) hours as needed for moderate pain. [name], DO UNKNOWN Active Spouse/Significant Other  allopurinol (ZYLOPRIM) 100 MG tablet [id number] No Take 2 tablets (200 mg total) by mouth daily. [name], DO 09/24/2022 am Active Spouse/Significant Other, Pharmacy Records  amLODipine (NORVASC) 10 MG tablet [id number] No TAKE 1 TABLET(10 MG) BY MOUTH DAILY  Patient taking differently: Take 10 mg by mouth daily. TAKE 1 TABLET(10 MG) BY MOUTH DAILY   Rai, Delene Ruffini, MD 09/24/2022 am Active Spouse/Significant Other, Pharmacy Records  Blood Glucose Monitoring Suppl (TRUE METRIX METER) DEVI [id number] No 1 kit by Does not apply route 3 (three) times daily. CHECK BLOOD SUGAR UP TO 3 TIMES DAILY. E11.9 Hoy Register, MD  Taking Active Spouse/Significant Other  colchicine 0.6 MG tablet [id number] No Take 1 tablet (0.6 mg total) by mouth as needed (Take 3 pills on first day of gout attack then take 2 per day as needed until attack ends). [name], DO UNKNOWN Active Spouse/Significant Other, Pharmacy Records  diazePAM, 15 MG Dose, (VALTOCO 15 MG DOSE) 2 x 7.5 MG/0.1ML LQPK [id number]  Place 15 mg into the nose as needed. Janeann Forehand D, NP  Active   diclofenac Sodium (VOLTAREN) 1 % GEL [id number] No Apply 4 g topically as needed (Apply to skin as needed for joint pain). [name], DO UNKNOWN Active Spouse/Significant Other, Pharmacy Records  fenofibrate (TRICOR) 48 MG tablet [id number] No Take 48 mg by mouth daily. [provider] 09/24/2022 Active Spouse/Significant Other, Pharmacy Records  ferrous sulfate 325 (65 FE) MG tablet [id number] No Take 1 tablet (325 mg total) by mouth 2 (two) times daily with a meal. Leroy Sea, MD UNKNOWN Active Spouse/Significant Other, Pharmacy Records           Med Note [facility], Elvin So   Tue Sep 28, 2022  1:22 AM)    folic acid (FOLVITE) 1 MG tablet [id number] No Take 1 tablet (1 mg total) by mouth daily. Leroy Sea, MD UNKNOWN Active Spouse/Significant Other, Pharmacy Records  glucose blood (TRUE METRIX BLOOD GLUCOSE TEST) test strip [id number] No Use as instructed Hoy Register, MD Taking Active Spouse/Significant Other  levETIRAcetam (KEPPRA) 500 MG tablet [id number] No Take 1 tablet (500 mg total) by mouth 2 (two) times daily. Rai, Ripudeep Kirtland Bouchard, MD 09/24/2022 pm Active Spouse/Significant Other, Pharmacy Records  magnesium oxide (MAG-OX) 400 (240 Mg) MG tablet [id number] No TAKE 1 TABLET BY MOUTH EVERY DAY  Patient taking differently: Take  400 mg by mouth daily.   Hoy Register, MD UNKNOWN Active Spouse/Significant Other  Discontinued 04/02/20 1650 (Completed Course)   Multiple Vitamin (MULTIVITAMIN WITH MINERALS) TABS tablet [id number] No Take 1  tablet by mouth daily. [provider] UNKNOWN Active Spouse/Significant Other  ondansetron (ZOFRAN-ODT) 4 MG disintegrating tablet [id number] No Take 1 tablet (4 mg total) by mouth every 8 (eight) hours as needed for nausea or vomiting. Rancour, Jeannett Senior, MD UNKNOWN Active Spouse/Significant Other  pantoprazole (PROTONIX) 40 MG tablet [id number] No Take 1 tablet (40 mg total) by mouth 2 (two) times daily before a meal.  Patient taking differently: Take 40 mg by mouth 2 (two) times daily.   Leroy Sea, MD 09/24/2022 Active Spouse/Significant Other, Pharmacy Records  TRUEplus Lancets 28G MISC [id number] No SMARTSIG:Topical 1 to 3 Times Daily Hoy Register, MD Taking Active Spouse/Significant Other            Home Care and Equipment/Supplies: Were Home Health Services Ordered?: NA Any new equipment or medical supplies ordered?: NA  Functional Questionnaire: Do you need assistance with bathing/showering or dressing?: No Do you need assistance with meal preparation?: No Do you need assistance with eating?: No Do you have difficulty maintaining continence: No Do you need assistance with getting out of bed/getting out of a chair/moving?: No Do you have difficulty managing or taking your medications?: No  Follow up appointments reviewed: PCP Follow-up appointment confirmed?: Yes Date of PCP follow-up appointment?: 10/21/22 (Office staff scheduled this appt) Follow-up Provider: [facility] Follow-up appointment confirmed?: No Reason Specialist Follow-Up Not Confirmed: Patient has Specialist Provider Number and will Call for Appointment Do you need transportation to your follow-up appointment?: No Do you understand care options if your condition(s) worsen?: Yes-patient verbalized understanding    SIGNATURE Karena Addison, LPN [facility] Nurse Health Advisor Direct Dial [phone]

## 2022-10-11 NOTE — Procedures (Addendum)
Patient Name: Christina Byrd  MRN: [id number]  Epilepsy Attending: Charlsie Quest  Referring Physician/Provider: Elmer Picker, NP  Duration: 09/29/2022 5621 to 09/29/2022 1216   Patient history: 57 y.o. female with a PMHx of seizures associated with EtOH withdrawal as well as seizures unrelated to EtOH use, HTN and GERD who presented to the ED via EMS on Monday night after seizure activity x 2, each lasting 20 seconds, followed by another en route. EEG to evaluate for seizure.   Level of alertness: Awake  AEDs during EEG study: LEV, Ativan   Technical aspects: This EEG study was done with scalp electrodes positioned according to the 10-20 International system of electrode placement. Electrical activity was reviewed with band pass filter of 1-70Hz , sensitivity of 7 uV/mm, display speed of 7mm/sec with a 60Hz  notched filter applied as appropriate. EEG data were recorded continuously and digitally stored.  Video monitoring was available and reviewed as appropriate.   Description: The posterior dominant rhythm consists of 10 Hz activity of moderate voltage (25-35 uV) seen predominantly in posterior head regions, symmetric and reactive to eye opening and eye closing. EEG showed continuous generalized 3 to 6 Hz theta-delta slowing admixed with an excessive amount of 15 to 18 Hz beta activity distributed symmetrically and diffusely. Hyperventilation and photic stimulation were not performed.      ABNORMALITY - Continuous slow, generalized - Excessive beta, generalized   IMPRESSION: This study is suggestive of moderate diffuse encephalopathy, nonspecific etiology. The excessive beta activity seen in the background is most likely due to the effect of benzodiazepine and is a benign EEG pattern. No seizures or definite epileptiform discharges were seen throughout the recording.     Farid Grigorian Annabelle Harman

## 2022-10-21 ENCOUNTER — Encounter: Payer: Self-pay | Admitting: Internal Medicine

## 2022-10-21 ENCOUNTER — Other Ambulatory Visit: Payer: Self-pay

## 2022-10-21 ENCOUNTER — Emergency Department (HOSPITAL_COMMUNITY)
Admission: EM | Admit: 2022-10-21 | Discharge: 2022-10-21 | Discharge status: Against Medical Advice | Payer: BLUE CROSS/BLUE SHIELD | Attending: Emergency Medicine | Admitting: Emergency Medicine

## 2022-10-21 ENCOUNTER — Encounter (HOSPITAL_COMMUNITY): Payer: Self-pay

## 2022-10-21 ENCOUNTER — Other Ambulatory Visit: Payer: Self-pay | Admitting: Family Medicine

## 2022-10-21 ENCOUNTER — Ambulatory Visit (INDEPENDENT_AMBULATORY_CARE_PROVIDER_SITE_OTHER): Payer: BLUE CROSS/BLUE SHIELD | Admitting: Internal Medicine

## 2022-10-21 VITALS — BP 81/58 | HR 89 | Temp 98.0°F | Ht 62.0 in | Wt 118.9 lb

## 2022-10-21 DIAGNOSIS — R112 Nausea with vomiting, unspecified: Secondary | ICD-10-CM | POA: Diagnosis not present

## 2022-10-21 DIAGNOSIS — I959 Hypotension, unspecified: Secondary | ICD-10-CM

## 2022-10-21 DIAGNOSIS — K219 Gastro-esophageal reflux disease without esophagitis: Secondary | ICD-10-CM

## 2022-10-21 DIAGNOSIS — R109 Unspecified abdominal pain: Secondary | ICD-10-CM | POA: Insufficient documentation

## 2022-10-21 DIAGNOSIS — R1314 Dysphagia, pharyngoesophageal phase: Secondary | ICD-10-CM | POA: Diagnosis not present

## 2022-10-21 DIAGNOSIS — M1990 Unspecified osteoarthritis, unspecified site: Secondary | ICD-10-CM

## 2022-10-21 DIAGNOSIS — Z5321 Procedure and treatment not carried out due to patient leaving prior to being seen by health care provider: Secondary | ICD-10-CM | POA: Insufficient documentation

## 2022-10-21 DIAGNOSIS — E119 Type 2 diabetes mellitus without complications: Secondary | ICD-10-CM

## 2022-10-21 DIAGNOSIS — M158 Other polyosteoarthritis: Secondary | ICD-10-CM

## 2022-10-21 DIAGNOSIS — G40909 Epilepsy, unspecified, not intractable, without status epilepticus: Secondary | ICD-10-CM

## 2022-10-21 DIAGNOSIS — M1A0610 Idiopathic chronic gout, right knee, without tophus (tophi): Secondary | ICD-10-CM

## 2022-10-21 DIAGNOSIS — E861 Hypovolemia: Secondary | ICD-10-CM

## 2022-10-21 HISTORY — DX: Hypotension, unspecified: I95.9

## 2022-10-21 MED ORDER — PANTOPRAZOLE SODIUM 40 MG PO TBEC
40.0000 mg | DELAYED_RELEASE_TABLET | Freq: Two times a day (BID) | ORAL | 2 refills | Status: DC
Start: 2022-10-21 — End: 2022-11-03

## 2022-10-21 MED ORDER — ALLOPURINOL 100 MG PO TABS
200.0000 mg | ORAL_TABLET | Freq: Every day | ORAL | 11 refills | Status: DC
Start: 2022-10-21 — End: 2022-10-22

## 2022-10-21 NOTE — Assessment & Plan Note (Addendum)
Patient presents for hospital follow up after admission earlier this month for breakthrough seizures related to not taking her Keppra and alcohol withdrawal. Patient has been taking her Keppra 500mg  BID since discharge without fail and has not experienced any more seizures. She has been consistently drinking approximately 3-4 drinks/night, both liquor and other alcoholic drinks.  -continue current regimen Keppra 500mg  BID -encourage alcohol cessation, though not cold [place] given increased seizure risk

## 2022-10-21 NOTE — Patient Instructions (Signed)
Hello Ms. Nastri,   It was a pleasure meeting you today. We are very concerned about your hypotension and your regurgitation. I recommend going to the ED within 24 hours if you continue not tolerating anything orally.   Thank you,  Monna Fam, MD

## 2022-10-21 NOTE — ED Triage Notes (Signed)
Ongoing vomiting of several months. Says she went to UC today and encouraged to ED for mild hypotension and IV fluids.

## 2022-10-21 NOTE — ED Notes (Signed)
Notified registration of leaving due to extended wait times.

## 2022-10-21 NOTE — Assessment & Plan Note (Addendum)
Patient is very concerned about her chronic arthritis, which has become worse in the past several months. She has limited motion in her left wrist, elbow, shoulder, hip, and knee. Historical imaging shows osteoarthritis ranging from mild to severe. Pain is poorly controlled with Tylenol and diclofenac -referral to physical therapy

## 2022-10-21 NOTE — Assessment & Plan Note (Addendum)
Patient reports she hasn't been able to keep down any food or water since she was discharged from the hospital three weeks ago. She immediately regurgitates anything taken po. She feels this may be due to her fenofibrate and magnesium though she has been taking both of these for many months. In the room today she took a small sip of water, then immediately spit it up into the garbage can. Patient states she will go home to check on her dogs and then go to the emergency department. ED charge nurse notified of patient's hypotension, poor oral intake, and need for IV fluids.  -fenofibrate, magnesium, amlodipine discontinued -Patient will be going to the ED for hypotension and IV fluids -follow up appointment in one month for BMP, lipid panel

## 2022-10-21 NOTE — Assessment & Plan Note (Signed)
Patient's blood pressure at presentation was 87/65, follow up blood pressure was 81/58. Patient reports regurgitating everything she has taken po for the past month. Denies lightheadedness or dizziness. Discussed GI referral, though patient stated she would be going to the emergency room today for IV fluids.  -GI referral for upper endoscopy placed -Patient planning on going to the ED for IV fluids today

## 2022-10-21 NOTE — ED Notes (Signed)
Due to movement unable to collect labs

## 2022-10-21 NOTE — Progress Notes (Signed)
Office Visit    Patient Name: Christina Byrd Date of Encounter: 10/21/2022  Primary Care Provider:  [name], DO Primary Cardiologist:  Charlton Haws, MD Primary Electrophysiologist: None   Past Medical History    Past Medical History:  Diagnosis Date   GASTROESOPHAGEAL REFLUX, NO ESOPHAGITIS 04/21/2006   Qualifier: Diagnosis of  By: Levada Schilling     GERD (gastroesophageal reflux disease) Dx 1995   HTN (hypertension)    Seizures (HCC)    Past Surgical History:  Procedure Laterality Date   BIOPSY  06/03/2022   Procedure: BIOPSY;  Surgeon: Jenel Lucks, MD;  Location: [facility] ENDOSCOPY;  Service: Gastroenterology;;   ESOPHAGOGASTRODUODENOSCOPY (EGD) WITH PROPOFOL N/A 06/03/2022   Procedure: ESOPHAGOGASTRODUODENOSCOPY (EGD) WITH PROPOFOL;  Surgeon: Jenel Lucks, MD;  Location: [facility] ENDOSCOPY;  Service: Gastroenterology;  Laterality: N/A;   HOT HEMOSTASIS N/A 06/03/2022   Procedure: HOT HEMOSTASIS (ARGON PLASMA COAGULATION/BICAP);  Surgeon: Jenel Lucks, MD;  Location: [facility] ENDOSCOPY;  Service: Gastroenterology;  Laterality: N/A;    Allergies  Allergies  Allergen Reactions   Losartan Potassium     Rash   Levaquin [Levofloxacin In D5w] Rash   Ace Inhibitors Cough    REACTION: cough   Codeine Nausea Only   Cefepime Rash    09/04/12 pm Patient started to break out with small macules after IV Vanco infusion, then macules increased in size after starting cefepime infusion.   Chlorhexidine Itching and Rash   Vancomycin Rash    09/04/12 pm Patient started to break out with small macules after IV Vanco infusion, then macules increased in size after starting cefepime infusion.     History of Present Illness    [name] is a 57 y.o. female with PMH of HFimEF, GERD, EtOH abuse, DM (diet controlled), epilepsy HLD who presents today for 1 year follow-up.  Ms. Reposa was initially seen by Dr. Eden Emms in 2014 referral from community health due to  hospitalization for dyspnea.  Patient denies history of CHF or history of CAD.  Patient had 2D echo completed and EF was 20-25% moderate MR. She was recommended to have right and left heart cath for further evaluation however could not complete due to lack of insurance.  She was started on carvedilol and hydralazine however had to stop hydralazine due to headaches. 2D echo was completed 04/2019 with recovered EF of 50-55%, no RWMA, grade 1 DD with normal valve function.  She was seen in follow-up 05/2020 and was tolerating amlodipine 10 mg without any issues.  She was seen by me on 11/18/2021 was experiencing intractable vomiting visit was postponed and rescheduled.  She has a history of polysubstance and alcohol abuse.  She was diagnosed with a upper GI bleed and admitted to the hospital in 05/2022.  She was seen at [facility] on 07/31/2022 with complaints of bilateral lower extremity pain.  Gout attack and started on colchicine.  She was last admitted 09/30/2022 after suffering a witnessed seizure due to appliance and alcohol abuse. She was seen by her PCP on 10/21/22 due to poor PO intake and was given a referral to GI for endoscopy. She was encouraged to go to the ED for fluids and left AMA due to extended wait times.   Since last being seen in the office patient reports that she has continued to feel poorly keep food on her stomach.  He is slightly improved from yesterday at 96/60 and heart rate however is elevated 102 bpm.  She  denies any chest pain or shortness of breath.  She is currently on Keppra and has been compliant with that medication.  She was advised to abstain from NSAIDs such as aspirin and ibuprofen due to her history of duodenal ulcers.  She continues to have symptoms such as dizziness and weakness and was advised to go to [facility] for fluid resuscitation due to current state of dehydration.  We discussed the importance of her consumption of alcohol and patient is currently ready to pursue  assistance with rehab and counseling.  Patient denies chest pain, palpitations, dyspnea, PND, orthopnea, nausea, vomiting, dizziness, syncope, edema, weight gain, or early satiety.  Home Medications    Current Outpatient Medications  Medication Sig Dispense Refill   acetaminophen (TYLENOL) 500 MG tablet Take 2 tablets (1,000 mg total) by mouth every 8 (eight) hours as needed for moderate pain. 100 tablet 2   allopurinol (ZYLOPRIM) 100 MG tablet Take 2 tablets (200 mg total) by mouth daily. 60 tablet 11   amLODipine (NORVASC) 10 MG tablet TAKE 1 TABLET(10 MG) BY MOUTH DAILY (Patient taking differently: Take 10 mg by mouth daily. TAKE 1 TABLET(10 MG) BY MOUTH DAILY) 90 tablet 1   Blood Glucose Monitoring Suppl (TRUE METRIX METER) DEVI 1 kit by Does not apply route 3 (three) times daily. CHECK BLOOD SUGAR UP TO 3 TIMES DAILY. E11.9 1 each 0   colchicine 0.6 MG tablet Take 1 tablet (0.6 mg total) by mouth as needed (Take 3 pills on first day of gout attack then take 2 per day as needed until attack ends). 30 tablet 0   diazePAM, 15 MG Dose, (VALTOCO 15 MG DOSE) 2 x 7.5 MG/0.1ML LQPK Place 15 mg into the nose as needed. 2 each 0   diclofenac Sodium (VOLTAREN) 1 % GEL Apply 4 g topically as needed (Apply to skin as needed for joint pain). 4 g 3   fenofibrate (TRICOR) 48 MG tablet Take 48 mg by mouth daily.     ferrous sulfate 325 (65 FE) MG tablet Take 1 tablet (325 mg total) by mouth 2 (two) times daily with a meal. 60 tablet 0   folic acid (FOLVITE) 1 MG tablet Take 1 tablet (1 mg total) by mouth daily. 30 tablet 0   glucose blood (TRUE METRIX BLOOD GLUCOSE TEST) test strip Use as instructed 100 each 12   levETIRAcetam (KEPPRA) 500 MG tablet Take 1 tablet (500 mg total) by mouth 2 (two) times daily. 60 tablet 3   magnesium oxide (MAG-OX) 400 (240 Mg) MG tablet TAKE 1 TABLET BY MOUTH EVERY DAY (Patient taking differently: Take 400 mg by mouth daily.) 90 tablet 0   Multiple Vitamin (MULTIVITAMIN WITH  MINERALS) TABS tablet Take 1 tablet by mouth daily.     ondansetron (ZOFRAN-ODT) 4 MG disintegrating tablet Take 1 tablet (4 mg total) by mouth every 8 (eight) hours as needed for nausea or vomiting. 20 tablet 0   pantoprazole (PROTONIX) 40 MG tablet Take 1 tablet (40 mg total) by mouth 2 (two) times daily before a meal. (Patient taking differently: Take 40 mg by mouth 2 (two) times daily.) 60 tablet 2   TRUEplus Lancets 28G MISC SMARTSIG:Topical 1 to 3 Times Daily 100 each 3   No current facility-administered medications for this visit.     Review of Systems  Please see the history of present illness.    (+) Fatigue (+) Dizziness, nausea  All other systems reviewed and are otherwise negative except as noted above.  Physical Exam    Wt Readings from Last 3 Encounters:  09/29/22 121 lb 0.5 oz (54.9 kg)  09/02/22 120 lb 12.8 oz (54.8 kg)  08/10/22 120 lb 14.4 oz (54.8 kg)   TD:VVOHY were no vitals filed for this visit.,There is no height or weight on file to calculate BMI.  Constitutional:      Appearance: Ill appearance. Not in distress.  Neck:     Vascular: JVD flat Pulmonary:     Effort: Pulmonary effort is normal.     Breath sounds: No wheezing. No rales. Diminished in the bases Cardiovascular:     Tachycardia normal S1. Normal S2.      Murmurs: There is no murmur.  Edema:    Peripheral edema absent.  Abdominal:     Palpations: Abdomen is soft non tender. There is no hepatomegaly.  Skin:    General: Warm and dry with poor turgor  neurological:     General: No focal deficit present.     Mental Status: Alert and oriented to person, place and time.     Cranial Nerves: Cranial nerves are intact.  EKG/LABS/ Recent Cardiac Studies    ECG personally reviewed by me today -none completed today   Risk Assessment/Calculations:    Lab Results  Component Value Date   WBC 8.7 09/30/2022   HGB 9.4 (L) 09/30/2022   HCT 28.8 (L) 09/30/2022   MCV 91.7 09/30/2022   PLT 128 (L)  09/30/2022   Lab Results  Component Value Date   CREATININE 1.12 (H) 09/30/2022   BUN 6 09/30/2022   NA 139 09/30/2022   K 4.2 09/30/2022   CL 105 09/30/2022   CO2 24 09/30/2022   Lab Results  Component Value Date   ALT 17 09/27/2022   AST 49 (H) 09/27/2022   ALKPHOS 70 09/27/2022   BILITOT 0.6 09/27/2022   Lab Results  Component Value Date   CHOL 154 04/27/2022   HDL 87 04/27/2022   LDLCALC 48 04/27/2022   LDLDIRECT 153 (H) 01/30/2008   TRIG 109 04/27/2022   CHOLHDL 1.8 04/27/2022    Lab Results  Component Value Date   HGBA1C 5.0 07/11/2022     Assessment & Plan    1.(HFimpeEF) Heart Failure with improved EF: -2D echo increased from 20% to 50-55% with no RWMA and no MVR. -Today patient is euvolemic on examination -She is dehydrated due to poor p.o. intake and will need fluid resuscitation. -She was instructed to go to the ED per her PCP yesterday for evaluation.  2.  Essential hypertension: -patients BP today was slightly improved at 96/60 -She was advised to not resume BP medications until fluid volume status is improved  3.  EtOH abuse: -Patient is currently drinking 2 shots of alcohol with 3-4 beers daily -She is currently willing to try rehab and is wanting to reduce her alcohol intake. -We will refer to social work for assistance and organizing resources.  4. DM type II:  -Patient's last hemoglobin A1c was 5  5. Hx of GI Bleed: -angiodysplasia of stomach and duodenum with bleeding, duodenal ulcer with recommendation to stop NSAIDs  -Patient advised to continue Protonix as prescribed  6. Hx of Seizures: -Patient currently on keppra -Patient was advised to continue her medications without interruption seizure activity.  Disposition: Follow-up with Charlton Haws, MD or APP in 12 months    Medication Adjustments/Labs and Tests Ordered: Current medicines are reviewed at length with the patient today.  Concerns regarding medicines are outlined  above.    Signed, Napoleon Form, Leodis Rains, NP 10/21/2022, 1:26 PM [facility]

## 2022-10-21 NOTE — ED Provider Triage Note (Signed)
Emergency Medicine Provider Triage Evaluation Note  Christina Byrd , a 57 y.o. female  was evaluated in triage.  Pt complains of inability to tolerate p.o. intake.  Does endorse some abdominal pain with nausea and vomiting.  Has history of dysphagia.  Patient was found to be hypotensive at the clinic and was sent in for IV fluids.  Terms are longstanding and ongoing since recent discharge 3 weeks ago.  No new complaints today..  Review of Systems  Positive: As above Negative: As above  Physical Exam  BP 93/66 (BP Location: Right Arm)   Pulse (!) 115   Temp 99.3 F (37.4 C) (Oral)   Resp 18   Ht 5\' 2"  (1.575 m)   Wt 52.6 kg   LMP 08/23/2011   SpO2 100%   BMI 21.20 kg/m  Gen:   Awake, no distress   Resp:  Normal effort  MSK:   Moves extremities without difficulty  Other:    Medical Decision Making  Medically screening exam initiated at 10:20 PM.  Appropriate orders placed.  Christina Byrd was informed that the remainder of the evaluation will be completed by another provider, this initial triage assessment does not replace that evaluation, and the importance of remaining in the ED until their evaluation is complete.     Marita Kansas, PA-C 10/21/22 2221

## 2022-10-21 NOTE — Progress Notes (Signed)
Subjective:  CC: hospital follow up, vomiting  HPI:  [name] is a 57 y.o. female with a past medical history significant for AUD, epilepsy, osteoarthritis presenting to the clinic for hospital follow up.   Blood pressure today was 87/64, with follow up BP 81/58. Patient reports she has had not been able to tolerate anything po for the last several weeks, immediately regurgitating any oral intake. After discussion, patient will be going to the emergency department for IV fluids and hypotension related to poor po intake. Ambulatory referral to GI has been placed. Notably, patient's chief concern is arm and elbow pain from her osteoarthritis.   Past Medical History:  Diagnosis Date   GASTROESOPHAGEAL REFLUX, NO ESOPHAGITIS 04/21/2006   Qualifier: Diagnosis of  By: Levada Schilling     GERD (gastroesophageal reflux disease) Dx 1995   HTN (hypertension)    Seizures (HCC)     Current Outpatient Medications on File Prior to Visit  Medication Sig Dispense Refill   acetaminophen (TYLENOL) 500 MG tablet Take 2 tablets (1,000 mg total) by mouth every 8 (eight) hours as needed for moderate pain. 100 tablet 2   allopurinol (ZYLOPRIM) 100 MG tablet Take 2 tablets (200 mg total) by mouth daily. 60 tablet 11   Blood Glucose Monitoring Suppl (TRUE METRIX METER) DEVI 1 kit by Does not apply route 3 (three) times daily. CHECK BLOOD SUGAR UP TO 3 TIMES DAILY. E11.9 1 each 0   colchicine 0.6 MG tablet Take 1 tablet (0.6 mg total) by mouth as needed (Take 3 pills on first day of gout attack then take 2 per day as needed until attack ends). 30 tablet 0   diazePAM, 15 MG Dose, (VALTOCO 15 MG DOSE) 2 x 7.5 MG/0.1ML LQPK Place 15 mg into the nose as needed. 2 each 0   diclofenac Sodium (VOLTAREN) 1 % GEL Apply 4 g topically as needed (Apply to skin as needed for joint pain). 4 g 3   ferrous sulfate 325 (65 FE) MG tablet Take 1 tablet (325 mg total) by mouth 2 (two) times daily with a meal. 60 tablet  0   folic acid (FOLVITE) 1 MG tablet Take 1 tablet (1 mg total) by mouth daily. 30 tablet 0   glucose blood (TRUE METRIX BLOOD GLUCOSE TEST) test strip Use as instructed 100 each 12   levETIRAcetam (KEPPRA) 500 MG tablet Take 1 tablet (500 mg total) by mouth 2 (two) times daily. 60 tablet 3   Multiple Vitamin (MULTIVITAMIN WITH MINERALS) TABS tablet Take 1 tablet by mouth daily.     ondansetron (ZOFRAN-ODT) 4 MG disintegrating tablet Take 1 tablet (4 mg total) by mouth every 8 (eight) hours as needed for nausea or vomiting. 20 tablet 0   pantoprazole (PROTONIX) 40 MG tablet Take 1 tablet (40 mg total) by mouth 2 (two) times daily before a meal. (Patient taking differently: Take 40 mg by mouth 2 (two) times daily.) 60 tablet 2   TRUEplus Lancets 28G MISC SMARTSIG:Topical 1 to 3 Times Daily 100 each 3   [DISCONTINUED] metFORMIN (GLUCOPHAGE) 500 MG tablet Take 0.5 tablets (250 mg total) by mouth daily with breakfast. 45 tablet 6   No current facility-administered medications on file prior to visit.    Review of Systems: ROS negative except for as is noted on the assessment and plan.  Objective:   Vitals:   10/21/22 1513 10/21/22 1553  BP: (!) 87/64 (!) 81/58  Pulse: 93 89  Temp: 98 F (  36.7 C)   TempSrc: Oral   SpO2: 99%   Weight: 118 lb 14.4 oz (53.9 kg)   Height: 5\' 2"  (1.575 m)     Physical Exam: Constitutional: well-appearing, in no acute distress HENT: normocephalic atraumatic, mucous membranes moist Eyes: conjunctiva non-erythematous Neck: supple Cardiovascular: regular rate and rhythm, no m/r/g Pulmonary/Chest: normal work of breathing on room air, lungs clear to auscultation bilaterally Abdominal: soft, non-tender, non-distended MSK: normal bulk and tone Neurological: alert & oriented x 3, 5/5 strength in bilateral upper and lower extremities Skin: warm and dry  Assessment & Plan:   Pharyngoesophageal dysphagia Patient reports she hasn't been able to keep down any  food or water since she was discharged from the hospital three weeks ago. She immediately regurgitates anything taken po. She feels this may be due to her fenofibrate and magnesium though she has been taking both of these for many months. In the room today she took a small sip of water, then immediately spit it up into the garbage can. Patient states she will go home to check on her dogs and then go to the emergency department. ED charge nurse notified of patient's hypotension, poor oral intake, and need for IV fluids.  -fenofibrate, magnesium, amlodipine discontinued -Patient will be going to the ED for hypotension and IV fluids -follow up appointment in one month for BMP, lipid panel  Seizure disorder [facility]) Patient presents for hospital follow up after admission earlier this month for breakthrough seizures related to not taking her Keppra and alcohol withdrawal. Patient has been taking her Keppra 500mg  BID since discharge without fail and has not experienced any more seizures. She has been consistently drinking approximately 3-4 drinks/night, both liquor and other alcoholic drinks.  -continue current regimen Keppra 500mg  BID -encourage alcohol cessation, though not cold [place] given increased seizure risk  Hypotension Patient's blood pressure at presentation was 87/65, follow up blood pressure was 81/58. Patient reports regurgitating everything she has taken po for the past month. Denies lightheadedness or dizziness. Discussed GI referral, though patient stated she would be going to the emergency room today for IV fluids.  -GI referral for upper endoscopy placed -Patient planning on going to the ED for IV fluids today    Osteoarthritis Patient is very concerned about her chronic arthritis, which has become worse in the past several months. She has limited motion in her left wrist, elbow, shoulder, hip, and knee. Historical imaging shows osteoarthritis ranging from mild to severe. Pain is poorly  controlled with Tylenol and diclofenac -referral to physical therapy    Patient seen with Dr. Assunta Gambles MD [facility]  PGY-1 Pager: [phone] Date 10/21/2022  Time 4:33 PM

## 2022-10-22 ENCOUNTER — Emergency Department (HOSPITAL_BASED_OUTPATIENT_CLINIC_OR_DEPARTMENT_OTHER)
Admission: EM | Admit: 2022-10-22 | Discharge: 2022-10-22 | Disposition: A | Discharge status: Home / Self Care | Payer: BLUE CROSS/BLUE SHIELD | Attending: Emergency Medicine | Admitting: Emergency Medicine

## 2022-10-22 ENCOUNTER — Encounter: Payer: Self-pay | Admitting: Nurse Practitioner

## 2022-10-22 ENCOUNTER — Encounter (HOSPITAL_BASED_OUTPATIENT_CLINIC_OR_DEPARTMENT_OTHER): Payer: Self-pay | Admitting: Emergency Medicine

## 2022-10-22 ENCOUNTER — Other Ambulatory Visit: Payer: Self-pay

## 2022-10-22 ENCOUNTER — Telehealth: Payer: Self-pay

## 2022-10-22 ENCOUNTER — Ambulatory Visit: Payer: BLUE CROSS/BLUE SHIELD | Attending: Nurse Practitioner | Admitting: Nurse Practitioner

## 2022-10-22 VITALS — BP 96/60 | HR 102 | Ht 62.0 in | Wt 118.2 lb

## 2022-10-22 DIAGNOSIS — R112 Nausea with vomiting, unspecified: Secondary | ICD-10-CM | POA: Insufficient documentation

## 2022-10-22 DIAGNOSIS — I10 Essential (primary) hypertension: Secondary | ICD-10-CM

## 2022-10-22 DIAGNOSIS — F10939 Alcohol use, unspecified with withdrawal, unspecified: Secondary | ICD-10-CM

## 2022-10-22 DIAGNOSIS — E860 Dehydration: Secondary | ICD-10-CM | POA: Insufficient documentation

## 2022-10-22 DIAGNOSIS — F1010 Alcohol abuse, uncomplicated: Secondary | ICD-10-CM

## 2022-10-22 DIAGNOSIS — Z8719 Personal history of other diseases of the digestive system: Secondary | ICD-10-CM

## 2022-10-22 DIAGNOSIS — I5032 Chronic diastolic (congestive) heart failure: Secondary | ICD-10-CM | POA: Diagnosis not present

## 2022-10-22 LAB — CBC WITH DIFFERENTIAL/PLATELET
Abs Immature Granulocytes: 0.01 10*3/uL (ref 0.00–0.07)
Basophils Absolute: 0 10*3/uL (ref 0.0–0.1)
Basophils Relative: 1 %
Eosinophils Absolute: 0 10*3/uL (ref 0.0–0.5)
Eosinophils Relative: 1 %
HCT: 29.4 % — ABNORMAL LOW (ref 36.0–46.0)
Hemoglobin: 9.9 g/dL — ABNORMAL LOW (ref 12.0–15.0)
Immature Granulocytes: 0 %
Lymphocytes Relative: 37 %
Lymphs Abs: 1.6 10*3/uL (ref 0.7–4.0)
MCH: 31.1 pg (ref 26.0–34.0)
MCHC: 33.7 g/dL (ref 30.0–36.0)
MCV: 92.5 fL (ref 80.0–100.0)
Monocytes Absolute: 0.5 10*3/uL (ref 0.1–1.0)
Monocytes Relative: 12 %
Neutro Abs: 2.2 10*3/uL (ref 1.7–7.7)
Neutrophils Relative %: 49 %
Platelets: 225 10*3/uL (ref 150–400)
RBC: 3.18 MIL/uL — ABNORMAL LOW (ref 3.87–5.11)
RDW: 16.5 % — ABNORMAL HIGH (ref 11.5–15.5)
WBC: 4.4 10*3/uL (ref 4.0–10.5)
nRBC: 0 % (ref 0.0–0.2)

## 2022-10-22 LAB — COMPREHENSIVE METABOLIC PANEL
ALT: 10 U/L (ref 0–44)
AST: 39 U/L (ref 15–41)
Albumin: 3.2 g/dL — ABNORMAL LOW (ref 3.5–5.0)
Alkaline Phosphatase: 69 U/L (ref 38–126)
Anion gap: 11 (ref 5–15)
BUN: 6 mg/dL (ref 6–20)
CO2: 24 mmol/L (ref 22–32)
Calcium: 7.9 mg/dL — ABNORMAL LOW (ref 8.9–10.3)
Chloride: 106 mmol/L (ref 98–111)
Creatinine, Ser: 1.19 mg/dL — ABNORMAL HIGH (ref 0.44–1.00)
GFR, Estimated: 53 mL/min — ABNORMAL LOW (ref >60)
Glucose, Bld: 78 mg/dL (ref 70–99)
Potassium: 3.2 mmol/L — ABNORMAL LOW (ref 3.5–5.1)
Sodium: 141 mmol/L (ref 135–145)
Total Bilirubin: 0.4 mg/dL (ref 0.3–1.2)
Total Protein: 6.7 g/dL (ref 6.5–8.1)

## 2022-10-22 LAB — LIPASE, BLOOD: Lipase: 64 U/L — ABNORMAL HIGH (ref 11–51)

## 2022-10-22 MED ORDER — METOCLOPRAMIDE HCL 5 MG/ML IJ SOLN
10.0000 mg | Freq: Once | INTRAMUSCULAR | Status: AC
  Administered 2022-10-22: 10 mg via INTRAVENOUS
  Filled 2022-10-22: qty 2

## 2022-10-22 MED ORDER — DIPHENHYDRAMINE HCL 50 MG/ML IJ SOLN
25.0000 mg | Freq: Once | INTRAMUSCULAR | Status: AC
  Administered 2022-10-22: 25 mg via INTRAVENOUS
  Filled 2022-10-22: qty 1

## 2022-10-22 MED ORDER — SODIUM CHLORIDE 0.9 % IV BOLUS
1000.0000 mL | Freq: Once | INTRAVENOUS | Status: AC
  Administered 2022-10-22: 1000 mL via INTRAVENOUS

## 2022-10-22 MED ORDER — ONDANSETRON 4 MG PO TBDP: ORAL_TABLET | ORAL | 0 refills | Status: DC

## 2022-10-22 NOTE — Telephone Encounter (Signed)
Prior Authorization for patient (Pantoprazole Sodium) came through on cover my meds was submitted with last office notes awaiting approval or denial.  KEY:[id number]

## 2022-10-22 NOTE — ED Notes (Signed)
Pt requests [place] IV d/t difficulty at [facility] getting access

## 2022-10-22 NOTE — Telephone Encounter (Signed)
Decision:Approved Christina Byrd (Key: [id number]) PA Case ID #: [id number] Rx #: [id number] Need Help? Call us at [phone] Outcome Approved today by [facility] [employer] [facility] 2017 Approved. Authorization Expiration Date: 10/22/2023 Drug Pantoprazole Sodium 40MG  dr tablets ePA cloud logo Form [employer] [place] [employer] Electronic Request Form Original Claim Info 75 DRUG REQUIRES PRIOR AUTHORIZATION

## 2022-10-22 NOTE — Progress Notes (Signed)
Internal Medicine Clinic Attending  I was physically present during the key portions of the resident provided service and participated in the medical decision making of patient's management care. I reviewed pertinent patient test results.  The assessment, diagnosis, and plan were formulated together and I agree with the documentation in the resident's note.  Mercie Eon, MD   I'm worried about this sick patient.   57yo woman with alcohol use and seizure disorder who presented to clinic with a chief concern of arm pain, but was found to be persistently hypotensive to 80s/50s (asymptomatic from this) and having new esophageal dysphagia with difficulty swallowing solids & liquids. We gave her a bottle of water to drink here, but she was not able to keep this down.   We strongly recommended going to the Emergency Room for further evaluation. We offered to wheel her upstairs to the [facility] ER, but she would prefer to go to the [facility] Emergency Room with her partner driving her.   We will also place a GI referral for further evaluation of her dysphagia.

## 2022-10-22 NOTE — Discharge Instructions (Signed)
Continue to take your medicine for acid reflux.  I prescribed you medicine to help you with nausea.  Try to avoid things that may make this worse, most commonly these are spicy foods tomato based products fatty foods chocolate and peppermint.  Alcohol and tobacco can also make this worse.  Return to the emergency department for sudden worsening pain fever or inability to eat or drink.

## 2022-10-22 NOTE — ED Provider Notes (Signed)
[place] EMERGENCY DEPARTMENT AT [facility] Provider Note   CSN: [id number] Arrival date & time: 10/22/22  1042     History  Chief Complaint  Patient presents with   Dehydration    Christina Byrd is a 57 y.o. female.  57 year old female with a chief complaints of decreased oral intake.  This been going on for weeks it sounds like.  She has been seen by her family doctor who is worried about hypotension and hypovolemia encouraged her to come to the ER.  Apparently the weights were too long and she was seen by her cardiologist today who encouraged her to do the same.  She complains of some left side pain that has been hurting her for months.        Home Medications Prior to Admission medications   Medication Sig Start Date End Date Taking? Authorizing Provider  ondansetron (ZOFRAN-ODT) 4 MG disintegrating tablet 4mg  ODT q4 hours prn nausea/vomit 10/22/22  Yes Melene Plan, DO  acetaminophen (TYLENOL) 500 MG tablet Take 2 tablets (1,000 mg total) by mouth every 8 (eight) hours as needed for moderate pain. 09/02/22 09/02/23  [name], DO  allopurinol (ZYLOPRIM) 100 MG tablet Take 100 mg by mouth daily. Pt takes 1 tablet daily.    [provider]  atorvastatin (LIPITOR) 20 MG tablet Take 20 mg by mouth daily. Pt takes 1 tablet daily.    [provider]  Blood Glucose Monitoring Suppl (TRUE METRIX METER) DEVI 1 kit by Does not apply route 3 (three) times daily. CHECK BLOOD SUGAR UP TO 3 TIMES DAILY. E11.9 02/27/19   Hoy Register, MD  diclofenac Sodium (VOLTAREN) 1 % GEL Apply 4 g topically as needed (Apply to skin as needed for joint pain). 09/02/22   [name], DO  ferrous sulfate 325 (65 FE) MG tablet Take 1 tablet (325 mg total) by mouth 2 (two) times daily with a meal. 06/05/22   Leroy Sea, MD  folic acid (FOLVITE) 1 MG tablet Take 1 tablet (1 mg total) by mouth daily. 06/05/22   Leroy Sea, MD  levETIRAcetam (KEPPRA) 500  MG tablet Take 1 tablet (500 mg total) by mouth 2 (two) times daily. 07/13/22 11/10/22  Rai, Delene Ruffini, MD  Multiple Vitamin (MULTIVITAMIN WITH MINERALS) TABS tablet Take 1 tablet by mouth daily.    [provider]  pantoprazole (PROTONIX) 40 MG tablet Take 1 tablet (40 mg total) by mouth 2 (two) times daily before a meal. 10/21/22   Monna Fam, MD  TRUEplus Lancets 28G MISC SMARTSIG:Topical 1 to 3 Times Daily 03/06/20   Hoy Register, MD  metFORMIN (GLUCOPHAGE) 500 MG tablet Take 0.5 tablets (250 mg total) by mouth daily with breakfast. 03/05/20 04/02/20  Anders Simmonds, PA-C      Allergies    Losartan potassium, Levaquin [levofloxacin in d5w], Ace inhibitors, Codeine, Cefepime, Chlorhexidine, and Vancomycin    Review of Systems   Review of Systems  Physical Exam Updated Vital Signs BP 100/64   Pulse 94   Temp 98 F (36.7 C) (Oral)   Resp 12   LMP 08/23/2011   SpO2 98%  Physical Exam Vitals and nursing note reviewed.  Constitutional:      General: She is not in acute distress.    Appearance: She is well-developed. She is not diaphoretic.  HENT:     Head: Normocephalic and atraumatic.  Eyes:     Pupils: Pupils are equal, round, and reactive to light.  Cardiovascular:  Rate and Rhythm: Normal rate and regular rhythm.     Heart sounds: No murmur heard.    No friction rub. No gallop.  Pulmonary:     Effort: Pulmonary effort is normal.     Breath sounds: No wheezing or rales.  Abdominal:     General: There is no distension.     Palpations: Abdomen is soft.     Tenderness: There is no abdominal tenderness.  Musculoskeletal:        General: No tenderness.     Cervical back: Normal range of motion and neck supple.  Skin:    General: Skin is warm and dry.  Neurological:     Mental Status: She is alert and oriented to person, place, and time.  Psychiatric:        Behavior: Behavior normal.     ED Results / Procedures / Treatments   Labs (all labs ordered  are listed, but only abnormal results are displayed) Labs Reviewed  CBC WITH DIFFERENTIAL/PLATELET - Abnormal; Notable for the following components:      Result Value   RBC 3.18 (*)    Hemoglobin 9.9 (*)    HCT 29.4 (*)    RDW 16.5 (*)    All other components within normal limits  COMPREHENSIVE METABOLIC PANEL - Abnormal; Notable for the following components:   Potassium 3.2 (*)    Creatinine, Ser 1.19 (*)    Calcium 7.9 (*)    Albumin 3.2 (*)    GFR, Estimated 53 (*)    All other components within normal limits  LIPASE, BLOOD - Abnormal; Notable for the following components:   Lipase 64 (*)    All other components within normal limits    EKG None  Radiology No results found.  Procedures Procedures    Medications Ordered in ED Medications  sodium chloride 0.9 % bolus 1,000 mL ( Intravenous Stopped 10/22/22 1313)  metoCLOPramide (REGLAN) injection 10 mg (10 mg Intravenous Given 10/22/22 1208)  diphenhydrAMINE (BENADRYL) injection 25 mg (25 mg Intravenous Given 10/22/22 1206)    ED Course/ Medical Decision Making/ A&P                                 Medical Decision Making Amount and/or Complexity of Data Reviewed Labs: ordered.  Risk Prescription drug management.   57 yo F with a chief complaints of decreased oral intake.  Was seen by her family doctor and then by her cardiologist both who encouraged her to come to the emergency department.  Obtain a laboratory evaluation.  Bolus of IV fluids.  Antiemetics.  Reassess.  Patient is feeling much better on repeat assessment.  She has very mild hypokalemia.  Renal function has trended up very modestly.  Hemoglobin at baseline.  She would like to go home at this time.  Will represcribe Zofran.  1:44 PM:  I have discussed the diagnosis/risks/treatment options with the patient.  Evaluation and diagnostic testing in the emergency department does not suggest an emergent condition requiring admission or immediate intervention  beyond what has been performed at this time.  They will follow up with PCP. We also discussed returning to the ED immediately if new or worsening sx occur. We discussed the sx which are most concerning (e.g., sudden worsening pain, fever, inability to tolerate by mouth) that necessitate immediate return. Medications administered to the patient during their visit and any new prescriptions provided to the patient are  listed below.  Medications given during this visit Medications  sodium chloride 0.9 % bolus 1,000 mL ( Intravenous Stopped 10/22/22 1313)  metoCLOPramide (REGLAN) injection 10 mg (10 mg Intravenous Given 10/22/22 1208)  diphenhydrAMINE (BENADRYL) injection 25 mg (25 mg Intravenous Given 10/22/22 1206)     The patient appears reasonably screen and/or stabilized for discharge and I doubt any other medical condition or other [facility] requiring further screening, evaluation, or treatment in the ED at this time prior to discharge.          Final Clinical Impression(s) / ED Diagnoses Final diagnoses:  Nausea and vomiting in adult    Rx / DC Orders ED Discharge Orders          Ordered    ondansetron (ZOFRAN-ODT) 4 MG disintegrating tablet        10/22/22 1243              Melene Plan, DO 10/22/22 1344

## 2022-10-22 NOTE — ED Triage Notes (Signed)
Pt c/o "no fluids in my body." Pt reports that if she eats anything "it just comes up." Pt sent here from Cardiology to get IVF

## 2022-10-22 NOTE — Patient Instructions (Addendum)
PLEASE GO TO THE EMERGENCY ROOM AT [facility] LOC[facility] FOR FLUIDS. THE ADDRESS IS  [address] [place], [place] [address]    Medication Instructions:  Your physician recommends that you continue on your current medications as directed. Please refer to the Current Medication list given to you today. *If you need a refill on your cardiac medications before your next appointment, please call your pharmacy*   Lab Work: LABS WILL BE COMPLETED IN THE EMERGENCY ROOM If you have labs (blood work) drawn today and your tests are completely normal, you will receive your results only by: MyChart Message (if you have MyChart) OR A paper copy in the mail If you have any lab test that is abnormal or we need to change your treatment, we will call you to review the results.   Testing/Procedures: NONE ORDERED   Follow-Up: At [facility], you and your health needs are our priority.  As part of our continuing mission to provide you with exceptional heart care, we have created designated Provider Care Teams.  These Care Teams include your primary Cardiologist (physician) and Advanced Practice Providers (APPs -  Physician Assistants and Nurse Practitioners) who all work together to provide you with the care you need, when you need it.  We recommend signing up for the patient portal called "MyChart".  Sign up information is provided on this After Visit Summary.  MyChart is used to connect with patients for Virtual Visits (Telemedicine).  Patients are able to view lab/test results, encounter notes, upcoming appointments, etc.  Non-urgent messages can be sent to your provider as well.   To learn more about what you can do with MyChart, go to [email or web address].    Your next appointment:   12 month(s)  Provider:   Charlton Haws, MD     Other Instructions Check your blood pressure daily for 2 weeks, then contact the office with your readings.  Make sure to check 2 hours after your  medications.   AVOID these things for 30 minutes before checking your blood pressure: No Drinking caffeine. No Drinking alcohol. No Eating. No Smoking. No Exercising.  Five minutes before checking your blood pressure: Pee. Sit in a dining chair. Avoid sitting in a soft couch or armchair. Be quiet. Do not talk.

## 2022-10-22 NOTE — ED Notes (Signed)
Pt discharged to home using teachback Method. Discharge instructions have been discussed with patient and/or family members. Pt verbally acknowledges understanding d/c instructions, has been given opportunity for questions to be answered, and endorses comprehension to checkout at registration before leaving.  

## 2022-10-26 ENCOUNTER — Telehealth: Payer: Self-pay | Admitting: Licensed Clinical Social Worker

## 2022-10-26 NOTE — Telephone Encounter (Signed)
H&V Care Navigation CSW Progress Note  Clinical Social Worker contacted patient by phone to f/u on referral for cessation resources. No answer this morning, attempted x3, phone rings through but unable to leave voicemail. Will re-attempt as able.  Patient is participating in a Managed Medicaid Plan:  Yes- Tourist information centre manager and BCBS commercial. BCBS is Out of Network. Will discuss with pt if I am able to touch base with her.   SDOH Screenings   Food Insecurity: No Food Insecurity (07/27/2022)  Housing: Low Risk  (07/27/2022)  Transportation Needs: No Transportation Needs (07/27/2022)  Utilities: Not At Risk (07/27/2022)  Depression (PHQ2-9): Low Risk  (09/02/2022)  Tobacco Use: Medium Risk (10/22/2022)   Octavio Graves, MSW, LCSW Clinical Social Worker II [facility] Heart/Vascular Care Navigation  [phone]- work cell phone (preferred) [phone]- desk phone

## 2022-10-28 ENCOUNTER — Telehealth (HOSPITAL_BASED_OUTPATIENT_CLINIC_OR_DEPARTMENT_OTHER): Payer: Self-pay | Admitting: Licensed Clinical Social Worker

## 2022-10-28 NOTE — Telephone Encounter (Signed)
H&V Care Navigation CSW Progress Note  Clinical Social Worker contacted patient by phone to f/u on referral for cessation resources. No answer this morning, attempted x2, phone rings through but unable to leave voicemail. Will re-attempt as able.   Patient is participating in a Managed Medicaid Plan:  Yes- Tourist information centre manager and BCBS commercial. BCBS is Out of Network. Will discuss with pt if I am able to touch base with her.   SDOH Screenings   Food Insecurity: No Food Insecurity (07/27/2022)  Housing: Low Risk  (07/27/2022)  Transportation Needs: No Transportation Needs (07/27/2022)  Utilities: Not At Risk (07/27/2022)  Depression (PHQ2-9): Low Risk  (09/02/2022)  Tobacco Use: Medium Risk (10/22/2022)     Octavio Graves, MSW, LCSW Clinical Social Worker II [facility] Heart/Vascular Care Navigation  [phone]- work cell phone (preferred) [phone]- desk phone

## 2022-10-29 NOTE — Progress Notes (Signed)
This encounter was created in error - please disregard.

## 2022-11-01 ENCOUNTER — Telehealth: Payer: Self-pay | Admitting: Licensed Clinical Social Worker

## 2022-11-01 NOTE — Telephone Encounter (Signed)
H&V Care Navigation CSW Progress Note  Clinical Social Worker contacted patient by phone to f/u on referral for cessation resources. No answer this morning, attempted x2, phone rings through but unable to leave voicemail. I have attempted 3 different times to reach pt. No DPR on file but to ensure I have accurate number I reached out to Lenetta Quaker, significant other at [phone]. He answers, states pt is asleep, confirmed number we have is accurate [phone]). Inquired if pt could give me a call once awake, partner states he will give her that number. Remain available should pt return my call.   Patient is participating in a Managed Medicaid Plan:  Yes- Tourist information centre manager and BCBS commercial. BCBS is Out of Network. Will discuss with pt if I am able to touch base with her.   SDOH Screenings   Food Insecurity: No Food Insecurity (07/27/2022)  Housing: Low Risk  (07/27/2022)  Transportation Needs: No Transportation Needs (07/27/2022)  Utilities: Not At Risk (07/27/2022)  Depression (PHQ2-9): Low Risk  (09/02/2022)  Tobacco Use: Medium Risk (10/22/2022)     Octavio Graves, MSW, LCSW Clinical Social Worker II [facility] Heart/Vascular Care Navigation  [phone]- work cell phone (preferred) [phone]- desk phone

## 2022-11-02 ENCOUNTER — Other Ambulatory Visit: Payer: Self-pay

## 2022-11-02 ENCOUNTER — Encounter (HOSPITAL_BASED_OUTPATIENT_CLINIC_OR_DEPARTMENT_OTHER): Payer: Self-pay | Admitting: Emergency Medicine

## 2022-11-02 ENCOUNTER — Emergency Department (HOSPITAL_BASED_OUTPATIENT_CLINIC_OR_DEPARTMENT_OTHER)
Admission: EM | Admit: 2022-11-02 | Discharge: 2022-11-03 | Disposition: A | Discharge status: Home / Self Care | Payer: BLUE CROSS/BLUE SHIELD | Attending: Emergency Medicine | Admitting: Emergency Medicine

## 2022-11-02 ENCOUNTER — Emergency Department (HOSPITAL_BASED_OUTPATIENT_CLINIC_OR_DEPARTMENT_OTHER): Payer: BLUE CROSS/BLUE SHIELD | Admitting: Radiology

## 2022-11-02 DIAGNOSIS — I10 Essential (primary) hypertension: Secondary | ICD-10-CM | POA: Diagnosis not present

## 2022-11-02 DIAGNOSIS — Z20822 Contact with and (suspected) exposure to covid-19: Secondary | ICD-10-CM | POA: Diagnosis not present

## 2022-11-02 DIAGNOSIS — R112 Nausea with vomiting, unspecified: Secondary | ICD-10-CM | POA: Insufficient documentation

## 2022-11-02 DIAGNOSIS — R1084 Generalized abdominal pain: Secondary | ICD-10-CM | POA: Insufficient documentation

## 2022-11-02 DIAGNOSIS — K0889 Other specified disorders of teeth and supporting structures: Secondary | ICD-10-CM | POA: Insufficient documentation

## 2022-11-02 DIAGNOSIS — Z79899 Other long term (current) drug therapy: Secondary | ICD-10-CM | POA: Diagnosis not present

## 2022-11-02 DIAGNOSIS — M542 Cervicalgia: Secondary | ICD-10-CM | POA: Diagnosis not present

## 2022-11-02 DIAGNOSIS — J029 Acute pharyngitis, unspecified: Secondary | ICD-10-CM | POA: Insufficient documentation

## 2022-11-02 DIAGNOSIS — H9202 Otalgia, left ear: Secondary | ICD-10-CM | POA: Insufficient documentation

## 2022-11-02 DIAGNOSIS — R070 Pain in throat: Secondary | ICD-10-CM

## 2022-11-02 DIAGNOSIS — M25512 Pain in left shoulder: Secondary | ICD-10-CM | POA: Diagnosis not present

## 2022-11-02 LAB — URINALYSIS, ROUTINE W REFLEX MICROSCOPIC
Bilirubin Urine: NEGATIVE
Glucose, UA: NEGATIVE mg/dL
Nitrite: NEGATIVE
Protein, ur: 100 mg/dL — AB
Specific Gravity, Urine: 1.022 (ref 1.005–1.030)
WBC, UA: >50 WBC/hpf (ref 0–5)
pH: 6 (ref 5.0–8.0)

## 2022-11-02 LAB — COMPREHENSIVE METABOLIC PANEL
ALT: 10 U/L (ref 0–44)
AST: 28 U/L (ref 15–41)
Albumin: 3.6 g/dL (ref 3.5–5.0)
Alkaline Phosphatase: 64 U/L (ref 38–126)
Anion gap: 11 (ref 5–15)
BUN: 10 mg/dL (ref 6–20)
CO2: 29 mmol/L (ref 22–32)
Calcium: 8.1 mg/dL — ABNORMAL LOW (ref 8.9–10.3)
Chloride: 100 mmol/L (ref 98–111)
Creatinine, Ser: 0.94 mg/dL (ref 0.44–1.00)
GFR, Estimated: >60 mL/min (ref >60)
Glucose, Bld: 109 mg/dL — ABNORMAL HIGH (ref 70–99)
Potassium: 3.2 mmol/L — ABNORMAL LOW (ref 3.5–5.1)
Sodium: 140 mmol/L (ref 135–145)
Total Bilirubin: 0.9 mg/dL (ref 0.3–1.2)
Total Protein: 7.1 g/dL (ref 6.5–8.1)

## 2022-11-02 LAB — CBC WITH DIFFERENTIAL/PLATELET
Abs Immature Granulocytes: 0.01 10*3/uL (ref 0.00–0.07)
Basophils Absolute: 0 10*3/uL (ref 0.0–0.1)
Basophils Relative: 0 %
Eosinophils Absolute: 0.1 10*3/uL (ref 0.0–0.5)
Eosinophils Relative: 1 %
HCT: 29.2 % — ABNORMAL LOW (ref 36.0–46.0)
Hemoglobin: 10 g/dL — ABNORMAL LOW (ref 12.0–15.0)
Immature Granulocytes: 0 %
Lymphocytes Relative: 32 %
Lymphs Abs: 2.2 10*3/uL (ref 0.7–4.0)
MCH: 31.4 pg (ref 26.0–34.0)
MCHC: 34.2 g/dL (ref 30.0–36.0)
MCV: 91.8 fL (ref 80.0–100.0)
Monocytes Absolute: 0.4 10*3/uL (ref 0.1–1.0)
Monocytes Relative: 6 %
Neutro Abs: 4 10*3/uL (ref 1.7–7.7)
Neutrophils Relative %: 61 %
Platelets: 99 10*3/uL — ABNORMAL LOW (ref 150–400)
RBC: 3.18 MIL/uL — ABNORMAL LOW (ref 3.87–5.11)
RDW: 15.6 % — ABNORMAL HIGH (ref 11.5–15.5)
WBC: 6.6 10*3/uL (ref 4.0–10.5)
nRBC: 0 % (ref 0.0–0.2)

## 2022-11-02 LAB — RESP PANEL BY RT-PCR (RSV, FLU A&B, COVID)  RVPGX2
Influenza A by PCR: NEGATIVE
Influenza B by PCR: NEGATIVE
Resp Syncytial Virus by PCR: NEGATIVE
SARS Coronavirus 2 by RT PCR: NEGATIVE

## 2022-11-02 LAB — LIPASE, BLOOD: Lipase: 180 U/L — ABNORMAL HIGH (ref 11–51)

## 2022-11-02 LAB — GROUP A STREP BY PCR: Group A Strep by PCR: NOT DETECTED

## 2022-11-02 MED ORDER — ONDANSETRON 4 MG PO TBDP: 4.0000 mg | ORAL_TABLET | Freq: Three times a day (TID) | ORAL | 0 refills | Status: DC | PRN

## 2022-11-02 MED ORDER — CLINDAMYCIN HCL 300 MG PO CAPS
300.0000 mg | ORAL_CAPSULE | Freq: Three times a day (TID) | ORAL | 0 refills | Status: AC
Start: 2022-11-02 — End: 2022-11-09

## 2022-11-02 MED ORDER — POTASSIUM CHLORIDE CRYS ER 20 MEQ PO TBCR
40.0000 meq | EXTENDED_RELEASE_TABLET | Freq: Once | ORAL | Status: AC
  Administered 2022-11-02: 40 meq via ORAL
  Filled 2022-11-02: qty 2

## 2022-11-02 MED ORDER — NAPROXEN 500 MG PO TABS
500.0000 mg | ORAL_TABLET | Freq: Two times a day (BID) | ORAL | 0 refills | Status: AC
Start: 2022-11-02 — End: ?

## 2022-11-02 NOTE — Discharge Instructions (Addendum)
Please take your antibiotics as prescribed.Take Zofran for nausea. Take tylenol/naproxen for pain. I recommend close follow-up with your PCP and a dentist for reevaluation.  Please do not hesitate to return to emergency department if worrisome signs symptoms we discussed become apparent.

## 2022-11-02 NOTE — ED Triage Notes (Signed)
Sore throat, "feels like gums are swollen" Not able to eat or drink   Started 3 days ago

## 2022-11-02 NOTE — ED Provider Notes (Signed)
EMERGENCY DEPARTMENT AT [facility] Provider Note   CSN: [id number] Arrival date & time: 11/02/22  1633     History  Chief Complaint  Patient presents with   Sore Throat    Christina Byrd is a 57 y.o. female with a history of GERD, hypertension, seizure presents today with multiple complaints.  Patient reports that in the last 3 days she has had increased swelling in her gums followed by neck pain and difficulty swallowing.  Patient also reports chronic left shoulder pain and right leg pain which she took OTC pain medication for with no relief.  She also reports that she had black stool yesterday with generalized abdominal pain. She also states that she been having some pain in her left ear.  She denies fever, chest pain, shortness of breath, bowel change, urinary symptoms.   Sore Throat    Past Medical History:  Diagnosis Date   GASTROESOPHAGEAL REFLUX, NO ESOPHAGITIS 04/21/2006   Qualifier: Diagnosis of  By: Levada Schilling     GERD (gastroesophageal reflux disease) Dx 1995   HTN (hypertension)    Seizures (HCC)    Past Surgical History:  Procedure Laterality Date   BIOPSY  06/03/2022   Procedure: BIOPSY;  Surgeon: Jenel Lucks, MD;  Location: [facility] ENDOSCOPY;  Service: Gastroenterology;;   ESOPHAGOGASTRODUODENOSCOPY (EGD) WITH PROPOFOL N/A 06/03/2022   Procedure: ESOPHAGOGASTRODUODENOSCOPY (EGD) WITH PROPOFOL;  Surgeon: Jenel Lucks, MD;  Location: [facility] ENDOSCOPY;  Service: Gastroenterology;  Laterality: N/A;   HOT HEMOSTASIS N/A 06/03/2022   Procedure: HOT HEMOSTASIS (ARGON PLASMA COAGULATION/BICAP);  Surgeon: Jenel Lucks, MD;  Location: [facility] ENDOSCOPY;  Service: Gastroenterology;  Laterality: N/A;     Home Medications Prior to Admission medications   Medication Sig Start Date End Date Taking? Authorizing Provider  clindamycin (CLEOCIN) 300 MG capsule Take 1 capsule (300 mg total) by mouth 3 (three) times daily for 7 days. 11/02/22  11/09/22 Yes Jeanelle Malling, PA  naproxen (NAPROSYN) 500 MG tablet Take 1 tablet (500 mg total) by mouth 2 (two) times daily. 11/02/22  Yes Jeanelle Malling, PA  ondansetron (ZOFRAN-ODT) 4 MG disintegrating tablet Take 1 tablet (4 mg total) by mouth every 8 (eight) hours as needed. 11/02/22  Yes Jeanelle Malling, PA  acetaminophen (TYLENOL) 500 MG tablet Take 2 tablets (1,000 mg total) by mouth every 8 (eight) hours as needed for moderate pain. 09/02/22 09/02/23  [name], DO  allopurinol (ZYLOPRIM) 100 MG tablet Take 100 mg by mouth daily. Pt takes 1 tablet daily.    [provider]  atorvastatin (LIPITOR) 20 MG tablet Take 20 mg by mouth daily. Pt takes 1 tablet daily.    [provider]  Blood Glucose Monitoring Suppl (TRUE METRIX METER) DEVI 1 kit by Does not apply route 3 (three) times daily. CHECK BLOOD SUGAR UP TO 3 TIMES DAILY. E11.9 02/27/19   Hoy Register, MD  diclofenac Sodium (VOLTAREN) 1 % GEL Apply 4 g topically as needed (Apply to skin as needed for joint pain). 09/02/22   [name], DO  ferrous sulfate 325 (65 FE) MG tablet Take 1 tablet (325 mg total) by mouth 2 (two) times daily with a meal. 06/05/22   Leroy Sea, MD  folic acid (FOLVITE) 1 MG tablet Take 1 tablet (1 mg total) by mouth daily. 06/05/22   Leroy Sea, MD  levETIRAcetam (KEPPRA) 500 MG tablet Take 1 tablet (500 mg total) by mouth 2 (two) times daily. 07/13/22 11/10/22  Rai, Ripudeep K,  MD  Multiple Vitamin (MULTIVITAMIN WITH MINERALS) TABS tablet Take 1 tablet by mouth daily.    [provider]  ondansetron (ZOFRAN-ODT) 4 MG disintegrating tablet 4mg  ODT q4 hours prn nausea/vomit 10/22/22   Melene Plan, DO  pantoprazole (PROTONIX) 40 MG tablet Take 1 tablet (40 mg total) by mouth 2 (two) times daily before a meal. 10/21/22   Monna Fam, MD  TRUEplus Lancets 28G MISC SMARTSIG:Topical 1 to 3 Times Daily 03/06/20   Hoy Register, MD  metFORMIN (GLUCOPHAGE) 500 MG tablet Take 0.5 tablets (250 mg  total) by mouth daily with breakfast. 03/05/20 04/02/20  Anders Simmonds, PA-C      Allergies    Losartan potassium, Levaquin [levofloxacin in d5w], Ace inhibitors, Codeine, Cefepime, Chlorhexidine, and Vancomycin    Review of Systems   Review of Systems Negative except as per HPI.  Physical Exam Updated Vital Signs BP 119/80   Pulse (!) 102   Temp 100.1 F (37.8 C) (Oral)   Resp 18   LMP 08/23/2011   SpO2 98%  Physical Exam Vitals and nursing note reviewed.  Constitutional:      Appearance: Normal appearance.  HENT:     Head: Normocephalic and atraumatic.     Mouth/Throat:     Mouth: Mucous membranes are moist.     Comments: Poor dentition with missing teeth in the lower jaw with mild swelling to the gingiva. Eyes:     General: No scleral icterus. Cardiovascular:     Rate and Rhythm: Normal rate and regular rhythm.     Pulses: Normal pulses.     Heart sounds: Normal heart sounds.  Pulmonary:     Effort: Pulmonary effort is normal.     Breath sounds: Normal breath sounds.  Abdominal:     General: Abdomen is flat.     Palpations: Abdomen is soft.     Tenderness: There is no abdominal tenderness.  Musculoskeletal:        General: No deformity.  Skin:    General: Skin is warm.     Findings: No rash.  Neurological:     General: No focal deficit present.     Mental Status: She is alert.  Psychiatric:        Mood and Affect: Mood normal.     ED Results / Procedures / Treatments   Labs (all labs ordered are listed, but only abnormal results are displayed) Labs Reviewed  COMPREHENSIVE METABOLIC PANEL - Abnormal; Notable for the following components:      Result Value   Potassium 3.2 (*)    Glucose, Bld 109 (*)    Calcium 8.1 (*)    All other components within normal limits  LIPASE, BLOOD - Abnormal; Notable for the following components:   Lipase 180 (*)    All other components within normal limits  CBC WITH DIFFERENTIAL/PLATELET - Abnormal; Notable for the  following components:   RBC 3.18 (*)    Hemoglobin 10.0 (*)    HCT 29.2 (*)    RDW 15.6 (*)    Platelets 99 (*)    All other components within normal limits  URINALYSIS, ROUTINE W REFLEX MICROSCOPIC - Abnormal; Notable for the following components:   APPearance HAZY (*)    Hgb urine dipstick SMALL (*)    Ketones, ur TRACE (*)    Protein, ur 100 (*)    Leukocytes,Ua LARGE (*)    Bacteria, UA FEW (*)    Non Squamous Epithelial 0-5 (*)    All  other components within normal limits  RESP PANEL BY RT-PCR (RSV, FLU A&B, COVID)  RVPGX2  GROUP A STREP BY PCR    EKG None  Radiology DG Shoulder Left  Result Date: 11/02/2022 CLINICAL DATA:  Left shoulder pain. EXAM: LEFT SHOULDER - 2+ VIEW COMPARISON:  None Available. FINDINGS: There is no evidence of acute fracture or dislocation. A small chronic deformity is seen along the greater tubercle of the left humeral head. Moderate severity degenerative changes are seen involving the left acromioclavicular joint and left glenohumeral articulation. Soft tissues are unremarkable. IMPRESSION: Moderate severity degenerative changes without an acute osseous abnormality. Electronically Signed   By: Aram Candela M.D.   On: 11/02/2022 22:03    Procedures Procedures    Medications Ordered in ED Medications  potassium chloride SA (KLOR-CON M) CR tablet 40 mEq (40 mEq Oral Given 11/02/22 2338)    ED Course/ Medical Decision Making/ A&P                                 Medical Decision Making Amount and/or Complexity of Data Reviewed Labs: ordered. Radiology: ordered.  Risk Prescription drug management.   This patient presents to the ED for multiple complains, this involves an extensive number of treatment options, and is a complaint that carries with a high risk of complications and morbidity.  The differential diagnosis includes dental infection, strep throat, COVID, RSV, flu, SOB, fracture, dislocation, ethanol intoxication, infectious  etiology.  This is not an exhaustive list.  Lab tests: I ordered and personally interpreted labs.  The pertinent results include: WBC unremarkable. Hbg unremarkable. Platelets unremarkable. Electrolytes significant for potassium 3.2.  Lipase 180.. BUN, creatinine unremarkable.  Viral panel was negative.  Group A strep is negative.  Imaging studies: I ordered imaging studies, personally reviewed, interpreted imaging and agree with the radiologist's interpretations. The results include: Negative shoulder x-ray  Problem list/ ED course/ Critical interventions/ Medical management: HPI: See above Vital signs within normal range and stable throughout visit. Laboratory/imaging studies significant for: See above. On physical examination, patient is afebrile and appears in no acute distress.  CBC with no leukocytosis.  Hemoglobin is 10 around her baseline.  Viral panel and group A strep are negative.  Patient does have swelling on her lower jaw gum line, suspicious for a dental infections.  No evidence of an abscess for I&D.  Next pain is likely unrelated to her dental pain.  No peritonsillar swelling or hot potato voice to concern for peritonsillar abscess.  No neck swelling.  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  Will prescribe naproxen for pain and orthopedics follow-up.  Patient also reports black stool today.  Abdominal exam with no peritoneal sign.  She is well-appearing and not immunocompromise.  No abdominal tenderness to palpation.  I have low suspicion for any acute emergent causes of abdominal pain at this point. Advised patient to take Tylenol/ibuprofen/naproxen for pain, follow-up with primary care physician for further evaluation and management, return to the ER if new or worsening symptoms. I have reviewed the patient home medicines and have made adjustments as needed.  Cardiac monitoring/EKG: The patient was maintained on a cardiac monitor.  I personally  reviewed and interpreted the cardiac monitor which showed an underlying rhythm of: sinus rhythm.  Additional history obtained: External records from outside source obtained and reviewed including: Chart review including previous notes, labs, imaging.  Consultations obtained:  Disposition  Continued outpatient therapy. Follow-up with PCP recommended for reevaluation of symptoms. Treatment plan discussed with patient.  Pt acknowledged understanding was agreeable to the plan. Worrisome signs and symptoms were discussed with patient, and patient acknowledged understanding to return to the ED if they noticed these signs and symptoms. Patient was stable upon discharge.   This chart was dictated using voice recognition software.  Despite best efforts to proofread,  errors can occur which can change the documentation meaning.          Final Clinical Impression(s) / ED Diagnoses Final diagnoses:  Pain, dental  Acute pain of left shoulder  Nausea and vomiting, unspecified vomiting type  Throat pain in adult    Rx / DC Orders ED Discharge Orders          Ordered    clindamycin (CLEOCIN) 300 MG capsule  3 times daily        11/02/22 2348    naproxen (NAPROSYN) 500 MG tablet  2 times daily        11/02/22 2348    ondansetron (ZOFRAN-ODT) 4 MG disintegrating tablet  Every 8 hours PRN        11/02/22 2348              Jeanelle Malling, PA 11/03/22 1610    Alvira Monday, MD 11/03/22 1151

## 2022-11-02 NOTE — ED Provider Notes (Incomplete)
[place] EMERGENCY DEPARTMENT AT [name] Provider Note   CSN: [id number] Arrival date & time: 11/02/22  1633     History {Add pertinent medical, surgical, social history, OB history to HPI:1} Chief Complaint  Patient presents with  . Sore Throat    Christina Byrd is a 57 y.o. female with a history of GERD, hypertension, seizure presents today with multiple complaints.  Patient reports that in the last 3 days she has had swelling in her gums followed by neck pain and difficulty swallowing.  Patient also reports chronic left shoulder pain and right leg pain which she took OTC pain medication for with no relief.  She also reports that she had black stool yesterday with generalized abdominal pain.  Since she also states that she been having some pain in her left ear.  She denies fever, chest pain, shortness of breath, bowel change, urinary symptoms.   Sore Throat    Past Medical History:  Diagnosis Date  . GASTROESOPHAGEAL REFLUX, NO ESOPHAGITIS 04/21/2006   Qualifier: Diagnosis of  By: Levada Schilling    . GERD (gastroesophageal reflux disease) Dx 1995  . HTN (hypertension)   . Seizures (HCC)    Past Surgical History:  Procedure Laterality Date  . BIOPSY  06/03/2022   Procedure: BIOPSY;  Surgeon: Jenel Lucks, MD;  Location: [facility] ENDOSCOPY;  Service: Gastroenterology;;  . ESOPHAGOGASTRODUODENOSCOPY (EGD) WITH PROPOFOL N/A 06/03/2022   Procedure: ESOPHAGOGASTRODUODENOSCOPY (EGD) WITH PROPOFOL;  Surgeon: Jenel Lucks, MD;  Location[facility] ENDOSCOPY;  Service: Gastroenterology;  Laterality: N/A;  . HOT HEMOSTASIS N/A 06/03/2022   Procedure: HOT HEMOSTASIS (ARGON PLASMA COAGULATION/BICAP);  Surgeon: Jenel Lucks, MD;  Location: [facility] ENDOSCOPY;  Service: Gastroenterology;  Laterality: N/A;     Home Medications Prior to Admission medications   Medication Sig Start Date End Date Taking? Authorizing Provider  clindamycin (CLEOCIN) 300 MG capsule Take 1  capsule (300 mg total) by mouth 3 (three) times daily for 7 days. 11/02/22 11/09/22 Yes Jeanelle Malling, PA  naproxen (NAPROSYN) 500 MG tablet Take 1 tablet (500 mg total) by mouth 2 (two) times daily. 11/02/22  Yes Jeanelle Malling, PA  ondansetron (ZOFRAN-ODT) 4 MG disintegrating tablet Take 1 tablet (4 mg total) by mouth every 8 (eight) hours as needed. 11/02/22  Yes Jeanelle Malling, PA  acetaminophen (TYLENOL) 500 MG tablet Take 2 tablets (1,000 mg total) by mouth every 8 (eight) hours as needed for moderate pain. 09/02/22 09/02/23  [name], DO  allopurinol (ZYLOPRIM) 100 MG tablet Take 100 mg by mouth daily. Pt takes 1 tablet daily.    [provider]  atorvastatin (LIPITOR) 20 MG tablet Take 20 mg by mouth daily. Pt takes 1 tablet daily.    [provider]  Blood Glucose Monitoring Suppl (TRUE METRIX METER) DEVI 1 kit by Does not apply route 3 (three) times daily. CHECK BLOOD SUGAR UP TO 3 TIMES DAILY. E11.9 02/27/19   Hoy Register, MD  diclofenac Sodium (VOLTAREN) 1 % GEL Apply 4 g topically as needed (Apply to skin as needed for joint pain). 09/02/22   [name], DO  ferrous sulfate 325 (65 FE) MG tablet Take 1 tablet (325 mg total) by mouth 2 (two) times daily with a meal. 06/05/22   Leroy Sea, MD  folic acid (FOLVITE) 1 MG tablet Take 1 tablet (1 mg total) by mouth daily. 06/05/22   Leroy Sea, MD  levETIRAcetam (KEPPRA) 500 MG tablet Take 1 tablet (500 mg total) by mouth  2 (two) times daily. 07/13/22 11/10/22  Rai, Delene Ruffini, MD  Multiple Vitamin (MULTIVITAMIN WITH MINERALS) TABS tablet Take 1 tablet by mouth daily.    [provider]  ondansetron (ZOFRAN-ODT) 4 MG disintegrating tablet 4mg  ODT q4 hours prn nausea/vomit 10/22/22   Melene Plan, DO  pantoprazole (PROTONIX) 40 MG tablet Take 1 tablet (40 mg total) by mouth 2 (two) times daily before a meal. 10/21/22   Monna Fam, MD  TRUEplus Lancets 28G MISC SMARTSIG:Topical 1 to 3 Times Daily 03/06/20   Hoy Register, MD  metFORMIN (GLUCOPHAGE) 500 MG tablet Take 0.5 tablets (250 mg total) by mouth daily with breakfast. 03/05/20 04/02/20  Anders Simmonds, PA-C      Allergies    Losartan potassium, Levaquin [levofloxacin in d5w], Ace inhibitors, Codeine, Cefepime, Chlorhexidine, and Vancomycin    Review of Systems   Review of Systems Negative except as per HPI.  Physical Exam Updated Vital Signs BP 119/80   Pulse (!) 102   Temp 100.1 F (37.8 C) (Oral)   Resp 18   LMP 08/23/2011   SpO2 98%  Physical Exam Vitals and nursing note reviewed.  Constitutional:      Appearance: Normal appearance.  HENT:     Head: Normocephalic and atraumatic.     Mouth/Throat:     Mouth: Mucous membranes are moist.  Eyes:     General: No scleral icterus. Cardiovascular:     Rate and Rhythm: Normal rate and regular rhythm.     Pulses: Normal pulses.     Heart sounds: Normal heart sounds.  Pulmonary:     Effort: Pulmonary effort is normal.     Breath sounds: Normal breath sounds.  Abdominal:     General: Abdomen is flat.     Palpations: Abdomen is soft.     Tenderness: There is no abdominal tenderness.  Musculoskeletal:        General: No deformity.  Skin:    General: Skin is warm.     Findings: No rash.  Neurological:     General: No focal deficit present.     Mental Status: She is alert.  Psychiatric:        Mood and Affect: Mood normal.     ED Results / Procedures / Treatments   Labs (all labs ordered are listed, but only abnormal results are displayed) Labs Reviewed  COMPREHENSIVE METABOLIC PANEL - Abnormal; Notable for the following components:      Result Value   Potassium 3.2 (*)    Glucose, Bld 109 (*)    Calcium 8.1 (*)    All other components within normal limits  LIPASE, BLOOD - Abnormal; Notable for the following components:   Lipase 180 (*)    All other components within normal limits  CBC WITH DIFFERENTIAL/PLATELET - Abnormal; Notable for the following components:    RBC 3.18 (*)    Hemoglobin 10.0 (*)    HCT 29.2 (*)    RDW 15.6 (*)    Platelets 99 (*)    All other components within normal limits  URINALYSIS, ROUTINE W REFLEX MICROSCOPIC - Abnormal; Notable for the following components:   APPearance HAZY (*)    Hgb urine dipstick SMALL (*)    Ketones, ur TRACE (*)    Protein, ur 100 (*)    Leukocytes,Ua LARGE (*)    Bacteria, UA FEW (*)    Non Squamous Epithelial 0-5 (*)    All other components within normal limits  RESP PANEL BY  RT-PCR (RSV, FLU A&B, COVID)  RVPGX2  GROUP A STREP BY PCR    EKG None  Radiology DG Shoulder Left  Result Date: 11/02/2022 CLINICAL DATA:  Left shoulder pain. EXAM: LEFT SHOULDER - 2+ VIEW COMPARISON:  None Available. FINDINGS: There is no evidence of acute fracture or dislocation. A small chronic deformity is seen along the greater tubercle of the left humeral head. Moderate severity degenerative changes are seen involving the left acromioclavicular joint and left glenohumeral articulation. Soft tissues are unremarkable. IMPRESSION: Moderate severity degenerative changes without an acute osseous abnormality. Electronically Signed   By: Aram Candela M.D.   On: 11/02/2022 22:03    Procedures Procedures  {Document cardiac monitor, telemetry assessment procedure when appropriate:1}  Medications Ordered in ED Medications  potassium chloride SA (KLOR-CON M) CR tablet 40 mEq (40 mEq Oral Given 11/02/22 2338)    ED Course/ Medical Decision Making/ A&P   {   Click here for ABCD2, HEART and other calculatorsREFRESH Note before signing :1}                              Medical Decision Making Amount and/or Complexity of Data Reviewed Labs: ordered. Radiology: ordered.  Risk Prescription drug management.   ***  {Document critical care time when appropriate:1} {Document review of labs and clinical decision tools ie heart score, Chads2Vasc2 etc:1}  {Document your independent review of radiology images, and  any outside records:1} {Document your discussion with family members, caretakers, and with consultants:1} {Document social determinants of health affecting pt's care:1} {Document your decision making why or why not admission, treatments were needed:1} Final Clinical Impression(s) / ED Diagnoses Final diagnoses:  Pain, dental  Acute pain of left shoulder  Nausea and vomiting, unspecified vomiting type  Throat pain in adult    Rx / DC Orders ED Discharge Orders          Ordered    clindamycin (CLEOCIN) 300 MG capsule  3 times daily        11/02/22 2348    naproxen (NAPROSYN) 500 MG tablet  2 times daily        11/02/22 2348    ondansetron (ZOFRAN-ODT) 4 MG disintegrating tablet  Every 8 hours PRN        11/02/22 2348

## 2022-11-03 ENCOUNTER — Ambulatory Visit (INDEPENDENT_AMBULATORY_CARE_PROVIDER_SITE_OTHER): Payer: BLUE CROSS/BLUE SHIELD | Admitting: Student

## 2022-11-03 VITALS — BP 98/69 | HR 110 | Temp 98.6°F | Ht 62.0 in | Wt 113.7 lb

## 2022-11-03 DIAGNOSIS — I959 Hypotension, unspecified: Secondary | ICD-10-CM | POA: Diagnosis not present

## 2022-11-03 DIAGNOSIS — R1314 Dysphagia, pharyngoesophageal phase: Secondary | ICD-10-CM

## 2022-11-03 DIAGNOSIS — F1010 Alcohol abuse, uncomplicated: Secondary | ICD-10-CM | POA: Diagnosis not present

## 2022-11-03 DIAGNOSIS — E861 Hypovolemia: Secondary | ICD-10-CM

## 2022-11-03 MED ORDER — PANTOPRAZOLE SODIUM 40 MG PO TBEC: 40.0000 mg | DELAYED_RELEASE_TABLET | Freq: Every day | ORAL | 3 refills | Status: DC

## 2022-11-03 NOTE — Patient Instructions (Addendum)
Thank you so much for coming to the clinic today!   The number for the stomach doctor is: [phone]  The number for the dentist is: [phone]  I am ordering a test called a barium swallow which will help figure out the cause of your trouble swallowing.  I have also sent a refill for the acid reflux medication, if it does not go through please let the office know and we will figure it out.  In the meanwhile, stop taking the amlodipine, and stop taking that antibiotic because I am worried that your blood pressure will drop too low and that your stomach may get made worse by the antibiotic.   If you have any questions please feel free to the call the clinic at anytime at [phone]. It was a pleasure seeing you!  Best, Dr. Thomasene Ripple

## 2022-11-04 NOTE — Progress Notes (Signed)
CC: Dysphagia follow-up  HPI:  [name] is a 57 y.o. female living with a history stated below and presents today for dysphagia follow-up. Please see problem based assessment and plan for additional details.  Past Medical History:  Diagnosis Date   GASTROESOPHAGEAL REFLUX, NO ESOPHAGITIS 04/21/2006   Qualifier: Diagnosis of  By: Levada Schilling     GERD (gastroesophageal reflux disease) Dx 1995   HTN (hypertension)    Seizures (HCC)     Current Outpatient Medications on File Prior to Visit  Medication Sig Dispense Refill   acetaminophen (TYLENOL) 500 MG tablet Take 2 tablets (1,000 mg total) by mouth every 8 (eight) hours as needed for moderate pain. 100 tablet 2   allopurinol (ZYLOPRIM) 100 MG tablet Take 100 mg by mouth daily. Pt takes 1 tablet daily.     atorvastatin (LIPITOR) 20 MG tablet Take 20 mg by mouth daily. Pt takes 1 tablet daily.     Blood Glucose Monitoring Suppl (TRUE METRIX METER) DEVI 1 kit by Does not apply route 3 (three) times daily. CHECK BLOOD SUGAR UP TO 3 TIMES DAILY. E11.9 1 each 0   clindamycin (CLEOCIN) 300 MG capsule Take 1 capsule (300 mg total) by mouth 3 (three) times daily for 7 days. 21 capsule 0   diclofenac Sodium (VOLTAREN) 1 % GEL Apply 4 g topically as needed (Apply to skin as needed for joint pain). 4 g 3   ferrous sulfate 325 (65 FE) MG tablet Take 1 tablet (325 mg total) by mouth 2 (two) times daily with a meal. 60 tablet 0   folic acid (FOLVITE) 1 MG tablet Take 1 tablet (1 mg total) by mouth daily. 30 tablet 0   levETIRAcetam (KEPPRA) 500 MG tablet Take 1 tablet (500 mg total) by mouth 2 (two) times daily. 60 tablet 3   Multiple Vitamin (MULTIVITAMIN WITH MINERALS) TABS tablet Take 1 tablet by mouth daily.     naproxen (NAPROSYN) 500 MG tablet Take 1 tablet (500 mg total) by mouth 2 (two) times daily. 30 tablet 0   ondansetron (ZOFRAN-ODT) 4 MG disintegrating tablet 4mg  ODT q4 hours prn nausea/vomit 20 tablet 0   ondansetron  (ZOFRAN-ODT) 4 MG disintegrating tablet Take 1 tablet (4 mg total) by mouth every 8 (eight) hours as needed. 20 tablet 0   TRUEplus Lancets 28G MISC SMARTSIG:Topical 1 to 3 Times Daily 100 each 3   [DISCONTINUED] metFORMIN (GLUCOPHAGE) 500 MG tablet Take 0.5 tablets (250 mg total) by mouth daily with breakfast. 45 tablet 6   No current facility-administered medications on file prior to visit.    Family History  Problem Relation Age of Onset   CAD Mother     Social History   Socioeconomic History   Marital status: Single    Spouse name: Not on file   Number of children: Not on file   Years of education: Not on file   Highest education level: Not on file  Occupational History   Not on file  Tobacco Use   Smoking status: Former    Current packs/day: 0.00    Types: Cigarettes    Quit date: 06/25/2012    Years since quitting: 10.3   Smokeless tobacco: Never  Vaping Use   Vaping status: Never Used  Substance and Sexual Activity   Alcohol use: Not Currently    Alcohol/week: 49.0 standard drinks of alcohol    Types: 14 Cans of beer, 35 Shots of liquor per week   Drug use: No  Sexual activity: Not Currently  Other Topics Concern   Not on file  Social History Narrative   Pt lives in single story home with her partner   Has 1 son   5 grandchildren   10th grade education   Works at [employer].    Right handed   Social Determinants of Health   Financial Resource Strain: Not on file  Food Insecurity: No Food Insecurity (07/27/2022)   Hunger Vital Sign    Worried About Running Out of Food in the Last Year: Never true    Ran Out of Food in the Last Year: Never true  Transportation Needs: No Transportation Needs (07/27/2022)   PRAPARE - Administrator, Civil Service (Medical): No    Lack of Transportation (Non-Medical): No  Physical Activity: Not on file  Stress: Not on file  Social Connections: Not on file  Intimate Partner Violence: Not At Risk (07/27/2022)    Humiliation, Afraid, Rape, and Kick questionnaire    Fear of Current or Ex-Partner: No    Emotionally Abused: No    Physically Abused: No    Sexually Abused: No    Review of Systems: ROS negative except for what is noted on the assessment and plan.  Vitals:   11/03/22 1600  BP: 98/69  Pulse: (!) 110  Temp: 98.6 F (37 C)  TempSrc: Oral  SpO2: 99%  Weight: 113 lb 11.2 oz (51.6 kg)  Height: 5\' 2"  (1.575 m)    Physical Exam: Constitutional: Appears older than stated age female, in no acute distress HENT: normocephalic atraumatic, mucous membranes moist, slightly enlarged tongue, no erythema or white plaques seen inside oral cavity Eyes: conjunctiva non-erythematous Neck: supple, no lymphadenopathy, no tenderness on palpation Cardiovascular: regular rate and rhythm, no m/r/g Pulmonary/Chest: normal work of breathing on room air, lungs clear to auscultation bilaterally Abdominal: soft, non-tender, non-distended   Assessment & Plan:   Hypotension Patient presents for follow-up regarding hypertension, blood pressure 98/69 today.  Likely in the setting of decreased p.o. intake secondary to her dysphagia.  Encourage patient to stop taking her antihypertensive medication such as amlodipine.  She is agreeable.  She denies any orthostatic symptoms, or falls.  Please see problem under dysphagia for further explanation.  Pharyngoesophageal dysphagia Patient has had decreased p.o. intake for about 4 weeks now she has not been able to keep anything down.  Solids and liquids both, she immediately regurgitates.  Since her last visit on August 29, she states that slowly and surely she is able to keep things down now such as tea and soup.  Encourage patient to keep trying liquids, before advancing to solids.  Referral was placed to GI for further evaluation, however patient has not followed up.  Provided number for gastroenterologist for him which she states she will call.  In the meanwhile, to  expedite workup will order modified barium swallow to further evaluate her dysphagia.  Plan: - Number given for gastroenterology - Modified barium swallow ordered  Alcohol abuse She was recently admitted to the hospital for acute alcohol withdrawal.  She has a longstanding history of alcohol abuse, which she states she is now slowing down.  She is currently limited to one mixed drink before she goes to sleep, and 1 shot of vodka.  This is an improvement, and she is very motivated to quitting altogether.  I offered her medications to help with the cravings, she declined.  At next evaluation, please assess if patient has been able to  quit.  Patient discussed with Dr. Lanelle Bal Edelin Fryer, M.D. [facility], PGY-2 Pager: [phone] Date 11/04/2022 Time 9:44 AM

## 2022-11-04 NOTE — Assessment & Plan Note (Signed)
She was recently admitted to the hospital for acute alcohol withdrawal.  She has a longstanding history of alcohol abuse, which she states she is now slowing down.  She is currently limited to one mixed drink before she goes to sleep, and 1 shot of vodka.  This is an improvement, and she is very motivated to quitting altogether.  I offered her medications to help with the cravings, she declined.  At next evaluation, please assess if patient has been able to quit.

## 2022-11-04 NOTE — Assessment & Plan Note (Signed)
Patient has had decreased p.o. intake for about 4 weeks now she has not been able to keep anything down.  Solids and liquids both, she immediately regurgitates.  Since her last visit on August 29, she states that slowly and surely she is able to keep things down now such as tea and soup.  Encourage patient to keep trying liquids, before advancing to solids.  Referral was placed to GI for further evaluation, however patient has not followed up.  Provided number for gastroenterologist for him which she states she will call.  In the meanwhile, to expedite workup will order modified barium swallow to further evaluate her dysphagia.  Plan: - Number given for gastroenterology - Modified barium swallow ordered

## 2022-11-04 NOTE — Assessment & Plan Note (Addendum)
Patient presents for follow-up regarding hypertension, blood pressure 98/69 today.  Likely in the setting of decreased p.o. intake secondary to her dysphagia.  Encourage patient to stop taking her antihypertensive medication such as amlodipine.  She is agreeable.  She denies any orthostatic symptoms, or falls.  Please see problem under dysphagia for further explanation.

## 2022-11-05 ENCOUNTER — Telehealth (HOSPITAL_COMMUNITY): Payer: Self-pay | Admitting: *Deleted

## 2022-11-05 ENCOUNTER — Other Ambulatory Visit (HOSPITAL_COMMUNITY): Payer: Self-pay | Admitting: *Deleted

## 2022-11-05 DIAGNOSIS — R1310 Dysphagia, unspecified: Secondary | ICD-10-CM

## 2022-11-05 NOTE — Telephone Encounter (Signed)
Attempted to contact patient to schedule OP MBS. Unable to leave VM. RKEEL

## 2022-11-15 ENCOUNTER — Inpatient Hospital Stay (HOSPITAL_COMMUNITY): Admission: RE | Admit: 2022-11-15 | Payer: BLUE CROSS/BLUE SHIELD | Source: Ambulatory Visit

## 2022-11-15 ENCOUNTER — Encounter (HOSPITAL_COMMUNITY): Payer: BLUE CROSS/BLUE SHIELD

## 2022-11-16 NOTE — Progress Notes (Signed)
Internal Medicine Clinic Attending  Case discussed with the resident at the time of the visit.  We reviewed the resident's history and exam and pertinent patient test results.  I agree with the assessment, diagnosis, and plan of care documented in the resident's note.  Debe Coder, MD

## 2022-11-19 ENCOUNTER — Inpatient Hospital Stay (HOSPITAL_COMMUNITY)
Admission: EM | Admit: 2022-11-19 | Discharge: 2022-11-22 | DRG: 641 | Disposition: A | Discharge status: Home / Self Care | Payer: BLUE CROSS/BLUE SHIELD | Attending: Family Medicine | Admitting: Family Medicine

## 2022-11-19 ENCOUNTER — Other Ambulatory Visit: Payer: Self-pay

## 2022-11-19 ENCOUNTER — Emergency Department (HOSPITAL_COMMUNITY): Payer: BLUE CROSS/BLUE SHIELD

## 2022-11-19 ENCOUNTER — Encounter (HOSPITAL_COMMUNITY): Payer: Self-pay

## 2022-11-19 DIAGNOSIS — K31819 Angiodysplasia of stomach and duodenum without bleeding: Secondary | ICD-10-CM | POA: Diagnosis present

## 2022-11-19 DIAGNOSIS — K219 Gastro-esophageal reflux disease without esophagitis: Secondary | ICD-10-CM | POA: Diagnosis present

## 2022-11-19 DIAGNOSIS — E872 Acidosis, unspecified: Secondary | ICD-10-CM | POA: Diagnosis present

## 2022-11-19 DIAGNOSIS — R1313 Dysphagia, pharyngeal phase: Secondary | ICD-10-CM | POA: Diagnosis present

## 2022-11-19 DIAGNOSIS — Z6820 Body mass index (BMI) 20.0-20.9, adult: Secondary | ICD-10-CM

## 2022-11-19 DIAGNOSIS — Z8249 Family history of ischemic heart disease and other diseases of the circulatory system: Secondary | ICD-10-CM | POA: Diagnosis not present

## 2022-11-19 DIAGNOSIS — E782 Mixed hyperlipidemia: Secondary | ICD-10-CM | POA: Diagnosis not present

## 2022-11-19 DIAGNOSIS — E8729 Other acidosis: Secondary | ICD-10-CM

## 2022-11-19 DIAGNOSIS — R748 Abnormal levels of other serum enzymes: Secondary | ICD-10-CM | POA: Insufficient documentation

## 2022-11-19 DIAGNOSIS — E440 Moderate protein-calorie malnutrition: Secondary | ICD-10-CM | POA: Diagnosis present

## 2022-11-19 DIAGNOSIS — M25511 Pain in right shoulder: Secondary | ICD-10-CM | POA: Diagnosis present

## 2022-11-19 DIAGNOSIS — W19XXXA Unspecified fall, initial encounter: Secondary | ICD-10-CM | POA: Diagnosis present

## 2022-11-19 DIAGNOSIS — E1122 Type 2 diabetes mellitus with diabetic chronic kidney disease: Secondary | ICD-10-CM | POA: Diagnosis present

## 2022-11-19 DIAGNOSIS — M19032 Primary osteoarthritis, left wrist: Secondary | ICD-10-CM | POA: Diagnosis present

## 2022-11-19 DIAGNOSIS — Z791 Long term (current) use of non-steroidal anti-inflammatories (NSAID): Secondary | ICD-10-CM

## 2022-11-19 DIAGNOSIS — M19012 Primary osteoarthritis, left shoulder: Secondary | ICD-10-CM | POA: Diagnosis present

## 2022-11-19 DIAGNOSIS — Y906 Blood alcohol level of 120-199 mg/100 ml: Secondary | ICD-10-CM | POA: Diagnosis present

## 2022-11-19 DIAGNOSIS — N1831 Chronic kidney disease, stage 3a: Secondary | ICD-10-CM | POA: Diagnosis present

## 2022-11-19 DIAGNOSIS — F1010 Alcohol abuse, uncomplicated: Secondary | ICD-10-CM | POA: Diagnosis present

## 2022-11-19 DIAGNOSIS — Z7984 Long term (current) use of oral hypoglycemic drugs: Secondary | ICD-10-CM

## 2022-11-19 DIAGNOSIS — R531 Weakness: Secondary | ICD-10-CM | POA: Diagnosis present

## 2022-11-19 DIAGNOSIS — I10 Essential (primary) hypertension: Secondary | ICD-10-CM | POA: Diagnosis not present

## 2022-11-19 DIAGNOSIS — G40409 Other generalized epilepsy and epileptic syndromes, not intractable, without status epilepticus: Secondary | ICD-10-CM | POA: Diagnosis present

## 2022-11-19 DIAGNOSIS — F1090 Alcohol use, unspecified, uncomplicated: Secondary | ICD-10-CM | POA: Diagnosis present

## 2022-11-19 DIAGNOSIS — Z8719 Personal history of other diseases of the digestive system: Secondary | ICD-10-CM

## 2022-11-19 DIAGNOSIS — Z91148 Patient's other noncompliance with medication regimen for other reason: Secondary | ICD-10-CM | POA: Diagnosis not present

## 2022-11-19 DIAGNOSIS — F40240 Claustrophobia: Secondary | ICD-10-CM | POA: Diagnosis present

## 2022-11-19 DIAGNOSIS — Z883 Allergy status to other anti-infective agents status: Secondary | ICD-10-CM

## 2022-11-19 DIAGNOSIS — Z888 Allergy status to other drugs, medicaments and biological substances status: Secondary | ICD-10-CM

## 2022-11-19 DIAGNOSIS — D631 Anemia in chronic kidney disease: Secondary | ICD-10-CM | POA: Diagnosis present

## 2022-11-19 DIAGNOSIS — Z87891 Personal history of nicotine dependence: Secondary | ICD-10-CM

## 2022-11-19 DIAGNOSIS — M25512 Pain in left shoulder: Secondary | ICD-10-CM | POA: Diagnosis not present

## 2022-11-19 DIAGNOSIS — F10231 Alcohol dependence with withdrawal delirium: Secondary | ICD-10-CM | POA: Diagnosis not present

## 2022-11-19 DIAGNOSIS — E785 Hyperlipidemia, unspecified: Secondary | ICD-10-CM | POA: Insufficient documentation

## 2022-11-19 DIAGNOSIS — M109 Gout, unspecified: Secondary | ICD-10-CM | POA: Diagnosis present

## 2022-11-19 DIAGNOSIS — G40909 Epilepsy, unspecified, not intractable, without status epilepticus: Secondary | ICD-10-CM

## 2022-11-19 DIAGNOSIS — I129 Hypertensive chronic kidney disease with stage 1 through stage 4 chronic kidney disease, or unspecified chronic kidney disease: Secondary | ICD-10-CM | POA: Diagnosis present

## 2022-11-19 DIAGNOSIS — E8809 Other disorders of plasma-protein metabolism, not elsewhere classified: Secondary | ICD-10-CM | POA: Diagnosis present

## 2022-11-19 DIAGNOSIS — E46 Unspecified protein-calorie malnutrition: Secondary | ICD-10-CM | POA: Insufficient documentation

## 2022-11-19 DIAGNOSIS — N179 Acute kidney failure, unspecified: Secondary | ICD-10-CM | POA: Diagnosis present

## 2022-11-19 DIAGNOSIS — Z79899 Other long term (current) drug therapy: Secondary | ICD-10-CM

## 2022-11-19 DIAGNOSIS — Z885 Allergy status to narcotic agent status: Secondary | ICD-10-CM

## 2022-11-19 DIAGNOSIS — Z881 Allergy status to other antibiotic agents status: Secondary | ICD-10-CM | POA: Diagnosis not present

## 2022-11-19 LAB — CBC WITH DIFFERENTIAL/PLATELET
Abs Immature Granulocytes: 0.11 10*3/uL — ABNORMAL HIGH (ref 0.00–0.07)
Basophils Absolute: 0.1 10*3/uL (ref 0.0–0.1)
Basophils Relative: 2 %
Eosinophils Absolute: 0 10*3/uL (ref 0.0–0.5)
Eosinophils Relative: 0 %
HCT: 25.3 % — ABNORMAL LOW (ref 36.0–46.0)
Hemoglobin: 8.7 g/dL — ABNORMAL LOW (ref 12.0–15.0)
Immature Granulocytes: 2 %
Lymphocytes Relative: 48 %
Lymphs Abs: 2.8 10*3/uL (ref 0.7–4.0)
MCH: 31.8 pg (ref 26.0–34.0)
MCHC: 34.4 g/dL (ref 30.0–36.0)
MCV: 92.3 fL (ref 80.0–100.0)
Monocytes Absolute: 0.3 10*3/uL (ref 0.1–1.0)
Monocytes Relative: 5 %
Neutro Abs: 2.5 10*3/uL (ref 1.7–7.7)
Neutrophils Relative %: 43 %
Platelets: 318 10*3/uL (ref 150–400)
RBC: 2.74 MIL/uL — ABNORMAL LOW (ref 3.87–5.11)
RDW: 15.9 % — ABNORMAL HIGH (ref 11.5–15.5)
WBC: 5.8 10*3/uL (ref 4.0–10.5)
nRBC: 0 % (ref 0.0–0.2)

## 2022-11-19 LAB — COMPREHENSIVE METABOLIC PANEL
ALT: 16 U/L (ref 0–44)
AST: 39 U/L (ref 15–41)
Albumin: 2.8 g/dL — ABNORMAL LOW (ref 3.5–5.0)
Alkaline Phosphatase: 54 U/L (ref 38–126)
Anion gap: 14 (ref 5–15)
BUN: 16 mg/dL (ref 6–20)
CO2: 18 mmol/L — ABNORMAL LOW (ref 22–32)
Calcium: 5.7 mg/dL — CL (ref 8.9–10.3)
Chloride: 106 mmol/L (ref 98–111)
Creatinine, Ser: 1.96 mg/dL — ABNORMAL HIGH (ref 0.44–1.00)
GFR, Estimated: 29 mL/min — ABNORMAL LOW (ref >60)
Glucose, Bld: 77 mg/dL (ref 70–99)
Potassium: 3.5 mmol/L (ref 3.5–5.1)
Sodium: 138 mmol/L (ref 135–145)
Total Bilirubin: 0.8 mg/dL (ref 0.3–1.2)
Total Protein: 6.4 g/dL — ABNORMAL LOW (ref 6.5–8.1)

## 2022-11-19 LAB — LIPASE, BLOOD: Lipase: 78 U/L — ABNORMAL HIGH (ref 11–51)

## 2022-11-19 LAB — MAGNESIUM: Magnesium: 0.8 mg/dL — CL (ref 1.7–2.4)

## 2022-11-19 LAB — ETHANOL: Alcohol, Ethyl (B): 128 mg/dL — ABNORMAL HIGH (ref <10)

## 2022-11-19 MED ORDER — THIAMINE MONONITRATE 100 MG PO TABS
100.0000 mg | ORAL_TABLET | Freq: Every day | ORAL | Status: DC
  Administered 2022-11-19: 100 mg via ORAL
  Filled 2022-11-19 (×2): qty 1

## 2022-11-19 MED ORDER — THIAMINE HCL 100 MG/ML IJ SOLN: 100.0000 mg | Freq: Every day | INTRAMUSCULAR | Status: DC

## 2022-11-19 MED ORDER — SODIUM CHLORIDE 0.9 % IV BOLUS
1000.0000 mL | Freq: Once | INTRAVENOUS | Status: AC
  Administered 2022-11-19: 1000 mL via INTRAVENOUS

## 2022-11-19 MED ORDER — CALCIUM GLUCONATE-NACL 1-0.675 GM/50ML-% IV SOLN
1.0000 g | Freq: Once | INTRAVENOUS | Status: AC
  Administered 2022-11-19: 1000 mg via INTRAVENOUS
  Filled 2022-11-19: qty 50

## 2022-11-19 MED ORDER — ONDANSETRON 4 MG PO TBDP: 4.0000 mg | ORAL_TABLET | Freq: Once | ORAL | Status: DC

## 2022-11-19 MED ORDER — LABETALOL HCL 5 MG/ML IV SOLN: 5.0000 mg | Freq: Four times a day (QID) | INTRAVENOUS | Status: DC | PRN

## 2022-11-19 MED ORDER — ONDANSETRON HCL 4 MG/2ML IJ SOLN: 4.0000 mg | Freq: Four times a day (QID) | INTRAMUSCULAR | Status: DC | PRN

## 2022-11-19 MED ORDER — LORAZEPAM 2 MG/ML IJ SOLN
0.0000 mg | INTRAMUSCULAR | Status: DC
  Administered 2022-11-20 – 2022-11-21 (×4): 1 mg via INTRAVENOUS
  Administered 2022-11-21: 2 mg via INTRAVENOUS
  Filled 2022-11-19 (×6): qty 1

## 2022-11-19 MED ORDER — ENSURE ENLIVE PO LIQD
237.0000 mL | Freq: Two times a day (BID) | ORAL | Status: DC
  Administered 2022-11-21 – 2022-11-22 (×3): 237 mL via ORAL

## 2022-11-19 MED ORDER — ACETAMINOPHEN 500 MG PO TABS
1000.0000 mg | ORAL_TABLET | Freq: Three times a day (TID) | ORAL | Status: DC | PRN
  Administered 2022-11-20 – 2022-11-22 (×2): 1000 mg via ORAL
  Filled 2022-11-19 (×3): qty 2

## 2022-11-19 MED ORDER — SODIUM CHLORIDE 0.9 % IV SOLN: INTRAVENOUS | Status: AC

## 2022-11-19 MED ORDER — PANTOPRAZOLE SODIUM 40 MG PO TBEC
40.0000 mg | DELAYED_RELEASE_TABLET | Freq: Every day | ORAL | Status: DC
  Administered 2022-11-21 – 2022-11-22 (×2): 40 mg via ORAL
  Filled 2022-11-19 (×3): qty 1

## 2022-11-19 MED ORDER — HEPARIN SODIUM (PORCINE) 5000 UNIT/ML IJ SOLN
5000.0000 [IU] | Freq: Three times a day (TID) | INTRAMUSCULAR | Status: DC
  Administered 2022-11-19 – 2022-11-22 (×8): 5000 [IU] via SUBCUTANEOUS
  Filled 2022-11-19 (×8): qty 1

## 2022-11-19 MED ORDER — LORAZEPAM 2 MG/ML IJ SOLN: 0.0000 mg | Freq: Three times a day (TID) | INTRAMUSCULAR | Status: DC

## 2022-11-19 MED ORDER — MAGNESIUM SULFATE 2 GM/50ML IV SOLN
2.0000 g | Freq: Once | INTRAVENOUS | Status: AC
  Administered 2022-11-19: 2 g via INTRAVENOUS
  Filled 2022-11-19: qty 50

## 2022-11-19 MED ORDER — DICLOFENAC SODIUM 1 % EX GEL
4.0000 g | CUTANEOUS | Status: DC | PRN
  Administered 2022-11-20 (×2): 4 g via TOPICAL
  Filled 2022-11-19: qty 100

## 2022-11-19 MED ORDER — ADULT MULTIVITAMIN W/MINERALS CH
1.0000 | ORAL_TABLET | Freq: Every day | ORAL | Status: DC
  Administered 2022-11-19: 1 via ORAL
  Filled 2022-11-19 (×2): qty 1

## 2022-11-19 MED ORDER — CALCIUM CARBONATE 1250 (500 CA) MG PO TABS
1.0000 | ORAL_TABLET | Freq: Two times a day (BID) | ORAL | Status: DC
  Administered 2022-11-20 – 2022-11-22 (×4): 1250 mg via ORAL
  Filled 2022-11-19 (×5): qty 1

## 2022-11-19 MED ORDER — ALLOPURINOL 100 MG PO TABS
100.0000 mg | ORAL_TABLET | Freq: Every day | ORAL | Status: DC
  Administered 2022-11-21 – 2022-11-22 (×2): 100 mg via ORAL
  Filled 2022-11-19 (×3): qty 1

## 2022-11-19 MED ORDER — FOLIC ACID 1 MG PO TABS
1.0000 mg | ORAL_TABLET | Freq: Every day | ORAL | Status: DC
  Administered 2022-11-19: 1 mg via ORAL
  Filled 2022-11-19 (×2): qty 1

## 2022-11-19 MED ORDER — ONDANSETRON HCL 4 MG PO TABS: 4.0000 mg | ORAL_TABLET | Freq: Four times a day (QID) | ORAL | Status: DC | PRN

## 2022-11-19 MED ORDER — ATORVASTATIN CALCIUM 40 MG PO TABS
20.0000 mg | ORAL_TABLET | Freq: Every day | ORAL | Status: DC
  Filled 2022-11-19: qty 1

## 2022-11-19 NOTE — ED Triage Notes (Signed)
Pt reports left arm and leg tingling for over a week. Pt denies weakness, pt is unable to raise left arm due to having arthritis and gout and states she can lift it, but it hurts. Pt also endorses that she drinks liquor daily.

## 2022-11-19 NOTE — ED Notes (Signed)
ED TO INPATIENT HANDOFF REPORT  Name/Age/Gender Christina Byrd 57 y.o. female  Code Status Code Status History     Date Active Date Inactive Code Status Order ID Comments User Context   09/27/2022 2335 10/01/2022 0556 Full Code [id number]  Briant Sites, DO ED   07/26/2022 2236 07/31/2022 1726 Full Code [id number]  Darlin Drop, DO ED   07/08/2022 0618 07/13/2022 1507 Full Code [id number]  Darlin Drop, DO Inpatient   06/25/2022 2029 06/29/2022 1749 Full Code [id number]  Darlin Drop, DO ED   06/03/2022 0804 06/05/2022 1758 Full Code [id number]  Emeline General, MD ED   07/08/2016 1638 07/09/2016 2136 Full Code [id number]  Ozella Rocks, MD ED   09/02/2012 0546 09/05/2012 1744 Full Code [id number]  Ron Parker, MD ED    Questions for Most Recent Historical Code Status (Order [id number])     Question Answer   By: Default: patient does not have capacity for decision making, no surrogate or prior directive available            Home/SNF/Other Home  Chief Complaint Hypocalcemia [E83.51]  Level of Care/Admitting Diagnosis ED Disposition     ED Disposition  Admit   Condition  --   Comment  Hospital Area: [facility] [[id number]]  Level of Care: Med-Surg [16]  May admit patient to [facility] or [facility] if equivalent level of care is available:: Yes  Covid Evaluation: Asymptomatic - no recent exposure (last 10 days) testing not required  Diagnosis: Hypocalcemia [275.41.ICD-9-CM]  Admitting Physician: Frankey Shown [[id number]]  Attending Physician: Frankey Shown [[id number]]  Certification:: I certify this patient will need inpatient services for at least 2 midnights  Expected Medical Readiness: 11/21/2022          Medical History Past Medical History:  Diagnosis Date   GASTROESOPHAGEAL REFLUX, NO ESOPHAGITIS 04/21/2006   Qualifier: Diagnosis of  By: Levada Schilling     GERD (gastroesophageal reflux disease) Dx 1995   HTN (hypertension)    Seizures  (HCC)     Allergies Allergies  Allergen Reactions   Losartan Potassium     Rash   Levaquin [Levofloxacin In D5w] Rash   Ace Inhibitors Cough    REACTION: cough   Codeine Nausea Only   Cefepime Rash    09/04/12 pm Patient started to break out with small macules after IV Vanco infusion, then macules increased in size after starting cefepime infusion.   Chlorhexidine Itching and Rash   Vancomycin Rash    09/04/12 pm Patient started to break out with small macules after IV Vanco infusion, then macules increased in size after starting cefepime infusion.    IV Location/Drains/Wounds Patient Lines/Drains/Airways Status     Active Line/Drains/Airways     Name Placement date Placement time Site Days   Peripheral IV 11/19/22 20 G Anterior;Distal;Right Forearm 11/19/22  1635  Forearm  less than 1            Labs/Imaging Results for orders placed or performed during the hospital encounter of 11/19/22 (from the past 48 hour(s))  Comprehensive metabolic panel     Status: Abnormal   Collection Time: 11/19/22  4:37 PM  Result Value Ref Range   Sodium 138 135 - 145 mmol/L   Potassium 3.5 3.5 - 5.1 mmol/L   Chloride 106 98 - 111 mmol/L   CO2 18 (L) 22 - 32 mmol/L   Glucose, Bld 77 70 - 99 mg/dL    Comment: Glucose reference range  applies only to samples taken after fasting for at least 8 hours.   BUN 16 6 - 20 mg/dL   Creatinine, Ser 4.69 (H) 0.44 - 1.00 mg/dL   Calcium 5.7 (LL) 8.9 - 10.3 mg/dL    Comment: CRITICAL RESULT CALLED TO, READ BACK BY AND VERIFIED WITH M.Juliette Standre, RN AT 1750 ON 09.27.24 BY N.THOMPSON    Total Protein 6.4 (L) 6.5 - 8.1 g/dL   Albumin 2.8 (L) 3.5 - 5.0 g/dL   AST 39 15 - 41 U/L   ALT 16 0 - 44 U/L   Alkaline Phosphatase 54 38 - 126 U/L   Total Bilirubin 0.8 0.3 - 1.2 mg/dL   GFR, Estimated 29 (L) >60 mL/min    Comment: (NOTE) Calculated using the CKD-EPI Creatinine Equation (2021)    Anion gap 14 5 - 15    Comment: Performed at [facility], [address], [place], [place] [address]  Lipase, blood     Status: Abnormal   Collection Time: 11/19/22  4:37 PM  Result Value Ref Range   Lipase 78 (H) 11 - 51 U/L    Comment: Performed at [facility], [address], [place], [place] [address]  CBC with Diff     Status: Abnormal   Collection Time: 11/19/22  4:37 PM  Result Value Ref Range   WBC 5.8 4.0 - 10.5 K/uL   RBC 2.74 (L) 3.87 - 5.11 MIL/uL   Hemoglobin 8.7 (L) 12.0 - 15.0 g/dL   HCT 44.0 (L) 10.2 - 72.5 %   MCV 92.3 80.0 - 100.0 fL   MCH 31.8 26.0 - 34.0 pg   MCHC 34.4 30.0 - 36.0 g/dL   RDW 36.6 (H) 44.0 - 34.7 %   Platelets 318 150 - 400 K/uL   nRBC 0.0 0.0 - 0.2 %   Neutrophils Relative % 43 %   Neutro Abs 2.5 1.7 - 7.7 K/uL   Lymphocytes Relative 48 %   Lymphs Abs 2.8 0.7 - 4.0 K/uL   Monocytes Relative 5 %   Monocytes Absolute 0.3 0.1 - 1.0 K/uL   Eosinophils Relative 0 %   Eosinophils Absolute 0.0 0.0 - 0.5 K/uL   Basophils Relative 2 %   Basophils Absolute 0.1 0.0 - 0.1 K/uL   Immature Granulocytes 2 %   Abs Immature Granulocytes 0.11 (H) 0.00 - 0.07 K/uL    Comment: Performed at [facility], [address] [place], [place] [address]  Ethanol     Status: Abnormal   Collection Time: 11/19/22  4:38 PM  Result Value Ref Range   Alcohol, Ethyl (B) 128 (H) <10 mg/dL    Comment: (NOTE) Lowest detectable limit for serum alcohol is 10 mg/dL.  For medical purposes only. Performed at [facility], [address] [place], [place] [address]   Magnesium     Status: Abnormal   Collection Time: 11/19/22  6:22 PM  Result Value Ref Range   Magnesium 0.8 (LL) 1.7 - 2.4 mg/dL    Comment: CRITICAL RESULT CALLED TO, READ BACK BY AND VERIFIED WITH Elicia Lui M. @ 1925 11/19/22 MCLEAN K. Performed at [facility], [address], [place], [place] [address]    CT Head Wo Contrast  Result Date: 11/19/2022 CLINICAL DATA:  Left-sided  weakness, numbness EXAM: CT HEAD WITHOUT CONTRAST TECHNIQUE: Contiguous axial images were obtained from the base of the skull through the vertex without intravenous contrast. RADIATION DOSE REDUCTION: This exam  was performed according to the departmental dose-optimization program which includes automated exposure control, adjustment of the mA and/or kV according to patient size and/or use of iterative reconstruction technique. COMPARISON:  09/27/2022 FINDINGS: Brain: No acute intracranial abnormality. Specifically, no hemorrhage, hydrocephalus, mass lesion, acute infarction, or significant intracranial injury. Vascular: No hyperdense vessel or unexpected calcification. Skull: No acute calvarial abnormality. Sinuses/Orbits: No acute findings Other: None IMPRESSION: No acute intracranial abnormality. Electronically Signed   By: Charlett Nose M.D.   On: 11/19/2022 17:26    Pending Labs Unresulted Labs (From admission, onward)     Start     Ordered   11/19/22 1800  Calcium, ionized  ONCE - STAT,   STAT        11/19/22 1759   11/19/22 1800  Parathyroid hormone, intact (no Ca)  ONCE - URGENT,   URGENT        11/19/22 1801   11/19/22 1610  Urinalysis, Routine w reflex microscopic -Urine, Clean Catch  Once,   URGENT       Question:  Specimen Source  Answer:  Urine, Clean Catch   11/19/22 1609   11/19/22 1610  Rapid urine drug screen (hospital performed)  ONCE - STAT,   STAT        11/19/22 1609            Vitals/Pain Today's Vitals   11/19/22 1700 11/19/22 1835 11/19/22 1854 11/19/22 1900  BP: (!) 127/94 (!) 118/99  136/83  Pulse: 95 91 91 99  Resp: 15 13  14   Temp:      SpO2: 95% 97%  92%  Weight:      Height:      PainSc:        Isolation Precautions No active isolations  Medications Medications  ondansetron (ZOFRAN-ODT) disintegrating tablet 4 mg (0 mg Oral Hold 11/19/22 1654)  thiamine (VITAMIN B1) tablet 100 mg (100 mg Oral Given 11/19/22 1910)    Or  thiamine (VITAMIN B1)  injection 100 mg ( Intravenous See Alternative 11/19/22 1910)  folic acid (FOLVITE) tablet 1 mg (1 mg Oral Given 11/19/22 1910)  multivitamin with minerals tablet 1 tablet (1 tablet Oral Given 11/19/22 1910)  LORazepam (ATIVAN) injection 0-4 mg (0 mg Intravenous Hold 11/19/22 1855)    Followed by  LORazepam (ATIVAN) injection 0-4 mg (has no administration in time range)  magnesium sulfate IVPB 2 g 50 mL (2 g Intravenous New Bag/Given 11/19/22 1950)  sodium chloride 0.9 % bolus 1,000 mL (0 mLs Intravenous Stopped 11/19/22 1945)  calcium gluconate 1 g/ 50 mL sodium chloride IVPB (0 mg Intravenous Stopped 11/19/22 1933)    Mobility walks with device

## 2022-11-19 NOTE — ED Notes (Signed)
Unable to draw labs from IV, pt refused to allow this RN to stick her anymore for ionized calcium

## 2022-11-19 NOTE — ED Notes (Signed)
Dr. Eloise Harman at bedside evaluating pt due to left sided tingling

## 2022-11-19 NOTE — H&P (Signed)
History and Physical    Patient: Christina Byrd:[id number] DOB: 06/10/1965 DOA: 11/19/2022 DOS: the patient was seen and examined on 11/19/2022 PCP: [name], DO  Patient coming from: Home  Chief Complaint:  Chief Complaint  Patient presents with   Tingling   HPI: Christina Byrd is a 57 y.o. female with medical history significant of hypertension, seizures unrelated to alcohol on Keppra, GERD, alcohol withdrawal-related seizures who presents to the emergency department due to 1 week onset of left sided tingling and weakness.  Symptoms have gradually worsened over the week with increased weakness and she states that she almost sustained a fall yesterday.  She complained of left shoulder pain which was chronic and thought was due to arthritis.  She complained of intermittent loss of taste for food which she thought may be related to her GERD symptoms and she endorsed recent mouth infection after teeth was pulled, she was recently prescribed with antibiotic from this ER on 9/10. Patient endorsed 2-4 shots of liquor mixed with soda daily with last drink being last night. At bedside, upper extremities was noted with tremors, patient states that it was due to having chills and that she usually does not shake when she does not drink.  However, patient was recently admitted from 8/5 to 8/8 [facility] ICU due to noncompliant with home seizure medications due to alcohol consumption.  ED Course:  In the emergency department, she was tachycardic, but other vital signs were within normal range.  Workup in the ED showed normocytic anemia, BMP was normal except for bicarb of 18 and creatinine of 1.96 (baseline creatinine at 0.8-1.2).  Calcium 5.7, but albumin was 2.8, eGFR was 29.  Lipase 78, magnesium 0.8, alcohol 128 CT head without contrast showed no acute intracranial abnormality Calcium gluconate 1 g x 1 was given.  IV NS 1 L was given.  Patient was started on CIWA protocol. Hospitalist was  asked to admit patient for further evaluation and management.  Review of Systems: Review of systems as noted in the HPI. All other systems reviewed and are negative.   Past Medical History:  Diagnosis Date   GASTROESOPHAGEAL REFLUX, NO ESOPHAGITIS 04/21/2006   Qualifier: Diagnosis of  By: Levada Schilling     GERD (gastroesophageal reflux disease) Dx 1995   HTN (hypertension)    Seizures (HCC)    Past Surgical History:  Procedure Laterality Date   BIOPSY  06/03/2022   Procedure: BIOPSY;  Surgeon: Jenel Lucks, MD;  Location: [facility] ENDOSCOPY;  Service: Gastroenterology;;   ESOPHAGOGASTRODUODENOSCOPY (EGD) WITH PROPOFOL N/A 06/03/2022   Procedure: ESOPHAGOGASTRODUODENOSCOPY (EGD) WITH PROPOFOL;  Surgeon: Jenel Lucks, MD;  Location: [facility] ENDOSCOPY;  Service: Gastroenterology;  Laterality: N/A;   HOT HEMOSTASIS N/A 06/03/2022   Procedure: HOT HEMOSTASIS (ARGON PLASMA COAGULATION/BICAP);  Surgeon: Jenel Lucks, MD;  Location: [facility] ENDOSCOPY;  Service: Gastroenterology;  Laterality: N/A;    Social History:  reports that she quit smoking about 10 years ago. Her smoking use included cigarettes. She has never used smokeless tobacco. She reports that she does not currently use alcohol after a past usage of about 49.0 standard drinks of alcohol per week. She reports that she does not use drugs.   Allergies  Allergen Reactions   Losartan Potassium     Rash   Levaquin [Levofloxacin In D5w] Rash   Ace Inhibitors Cough    REACTION: cough   Codeine Nausea Only   Cefepime Rash    09/04/12 pm Patient started to break  out with small macules after IV Vanco infusion, then macules increased in size after starting cefepime infusion.   Chlorhexidine Itching and Rash   Vancomycin Rash    09/04/12 pm Patient started to break out with small macules after IV Vanco infusion, then macules increased in size after starting cefepime infusion.    Family History  Problem Relation Age of Onset   CAD  Mother      Prior to Admission medications   Medication Sig Start Date End Date Taking? Authorizing Provider  acetaminophen (TYLENOL) 500 MG tablet Take 2 tablets (1,000 mg total) by mouth every 8 (eight) hours as needed for moderate pain. 09/02/22 09/02/23  [name], DO  allopurinol (ZYLOPRIM) 100 MG tablet Take 100 mg by mouth daily. Pt takes 1 tablet daily.    [provider]  atorvastatin (LIPITOR) 20 MG tablet Take 20 mg by mouth daily. Pt takes 1 tablet daily.    [provider]  Blood Glucose Monitoring Suppl (TRUE METRIX METER) DEVI 1 kit by Does not apply route 3 (three) times daily. CHECK BLOOD SUGAR UP TO 3 TIMES DAILY. E11.9 02/27/19   Hoy Register, MD  diclofenac Sodium (VOLTAREN) 1 % GEL Apply 4 g topically as needed (Apply to skin as needed for joint pain). 09/02/22   [name], DO  ferrous sulfate 325 (65 FE) MG tablet Take 1 tablet (325 mg total) by mouth 2 (two) times daily with a meal. 06/05/22   Leroy Sea, MD  folic acid (FOLVITE) 1 MG tablet Take 1 tablet (1 mg total) by mouth daily. 06/05/22   Leroy Sea, MD  levETIRAcetam (KEPPRA) 500 MG tablet Take 1 tablet (500 mg total) by mouth 2 (two) times daily. 07/13/22 11/10/22  Rai, Delene Ruffini, MD  Multiple Vitamin (MULTIVITAMIN WITH MINERALS) TABS tablet Take 1 tablet by mouth daily.    [provider]  naproxen (NAPROSYN) 500 MG tablet Take 1 tablet (500 mg total) by mouth 2 (two) times daily. 11/02/22   Jeanelle Malling, PA  ondansetron (ZOFRAN-ODT) 4 MG disintegrating tablet 4mg  ODT q4 hours prn nausea/vomit 10/22/22   Melene Plan, DO  ondansetron (ZOFRAN-ODT) 4 MG disintegrating tablet Take 1 tablet (4 mg total) by mouth every 8 (eight) hours as needed. 11/02/22   Jeanelle Malling, PA  pantoprazole (PROTONIX) 40 MG tablet Take 1 tablet (40 mg total) by mouth daily. 11/03/22   Nooruddin, Jason Fila, MD  TRUEplus Lancets 28G MISC SMARTSIG:Topical 1 to 3 Times Daily 03/06/20   Hoy Register, MD   metFORMIN (GLUCOPHAGE) 500 MG tablet Take 0.5 tablets (250 mg total) by mouth daily with breakfast. 03/05/20 04/02/20  Anders Simmonds, PA-C    Physical Exam: BP (!) 144/83   Pulse (!) 101   Temp 98.7 F (37.1 C)   Resp 14   Ht 5\' 2"  (1.575 m)   Wt 51 kg   LMP 08/23/2011   SpO2 94%   BMI 20.56 kg/m   General: 57 y.o. year-old female well developed well nourished in no acute distress.  Alert and oriented x3. HEENT: NCAT, EOMI Neck: Supple, trachea medial Cardiovascular: Tachycardia.  Regular rate and rhythm with no rubs or gallops.  No thyromegaly or JVD noted.  No lower extremity edema. 2/4 pulses in all 4 extremities. Respiratory: Clear to auscultation with no wheezes or rales. Good inspiratory effort. Abdomen: Soft, nontender nondistended with normal bowel sounds x4 quadrants. Muskuloskeletal: Tremors in hands.  Weakness and restriction of LUE due to chronic pain at left  shoulder.  No cyanosis, clubbing or edema noted bilaterally Neuro: CN II-XII intact, strength 5/5 x 4, sensation, reflexes intact Skin: No ulcerative lesions noted or rashes Psychiatry: Judgement and insight appear normal. Mood is appropriate for condition and setting          Labs on Admission:  Basic Metabolic Panel: Recent Labs  Lab 11/19/22 1637 11/19/22 1822  NA 138  --   K 3.5  --   CL 106  --   CO2 18*  --   GLUCOSE 77  --   BUN 16  --   CREATININE 1.96*  --   CALCIUM 5.7*  --   MG  --  0.8*   Liver Function Tests: Recent Labs  Lab 11/19/22 1637  AST 39  ALT 16  ALKPHOS 54  BILITOT 0.8  PROT 6.4*  ALBUMIN 2.8*   Recent Labs  Lab 11/19/22 1637  LIPASE 78*   No results for input(s): "AMMONIA" in the last 168 hours. CBC: Recent Labs  Lab 11/19/22 1637  WBC 5.8  NEUTROABS 2.5  HGB 8.7*  HCT 25.3*  MCV 92.3  PLT 318   Cardiac Enzymes: No results for input(s): "CKTOTAL", "CKMB", "CKMBINDEX", "TROPONINI" in the last 168 hours.  BNP (last 3 results) Recent Labs     06/05/22 0249  BNP 42.9    ProBNP (last 3 results) No results for input(s): "PROBNP" in the last 8760 hours.  CBG: No results for input(s): "GLUCAP" in the last 168 hours.  Radiological Exams on Admission: CT Head Wo Contrast  Result Date: 11/19/2022 CLINICAL DATA:  Left-sided weakness, numbness EXAM: CT HEAD WITHOUT CONTRAST TECHNIQUE: Contiguous axial images were obtained from the base of the skull through the vertex without intravenous contrast. RADIATION DOSE REDUCTION: This exam was performed according to the departmental dose-optimization program which includes automated exposure control, adjustment of the mA and/or kV according to patient size and/or use of iterative reconstruction technique. COMPARISON:  09/27/2022 FINDINGS: Brain: No acute intracranial abnormality. Specifically, no hemorrhage, hydrocephalus, mass lesion, acute infarction, or significant intracranial injury. Vascular: No hyperdense vessel or unexpected calcification. Skull: No acute calvarial abnormality. Sinuses/Orbits: No acute findings Other: None IMPRESSION: No acute intracranial abnormality. Electronically Signed   By: Charlett Nose M.D.   On: 11/19/2022 17:26    EKG: I independently viewed the EKG done and my findings are as followed: Sinus tachycardia at a rate of 116 bpm  Assessment/Plan Present on Admission:  Hypocalcemia  Hypomagnesemia  Alcohol use disorder  GERD without esophagitis  HYPERTENSION, BENIGN ESSENTIAL  Left shoulder pain  Principal Problem:   Hypocalcemia Active Problems:   HYPERTENSION, BENIGN ESSENTIAL   GERD without esophagitis   Left shoulder pain   Alcohol use disorder   Seizure disorder (HCC)   Hypomagnesemia   AKI (acute kidney injury) (HCC)   Hypoalbuminemia due to protein-calorie malnutrition (HCC)   Mixed hyperlipidemia   Elevated lipase  Hypocalcemia Corrected calcium level based on albumin level was 6.7 She was treated with calcium gluconate Continue  Os-Cal  Hypomagnesemia Mg level is 0.8 This will be replenished Please continue to monitor Mg level and correct accordingly  Acute kidney injury Creatinine of 1.96 (baseline creatinine at 0.8-1.2) Continue gentle hydration Renally adjust medications, avoid nephrotoxic agents/dehydration/hypotension  Alcohol use disorder Alcohol level was 128 Continue CIWA protocol, thiamine and multivitamin Continue fall precaution and neuro checks Patient was counseled on alcohol withdrawal cessation   Anion gap metabolic acidosis Bicarb was 18 Anion gap was 14, corrected anion  gap based on albumin level (2.8) was at least 16.5 Continue gentle hydration Repeat BMP in the morning  Hypoalbuminemia possibly secondary to moderate protein calorie malnutrition Albumin 2.8, protein supplement will be provided  Elevated lipase (chronic) Lipase 78, stable.  This was 180 on 11/02/2022 This was possibly secondary to alcohol use disorder  Left shoulder pain possibly secondary to history of arthritis Continue analgesics as needed  Seizures Continue Keppra per home regimen Continue seizure precaution  Essential hypertension Low BP meds noted in patient's home med rec Continue IV labetalol 5 mg every 6 hours as needed for SBP > 170  GERD Continue PPI  Mixed hyperlipidemia Continue Lipitor  History of gout Continue home allopurinol  DVT prophylaxis: Heparin subcu  Advance Care Planning: Code status: Full Code  Consults: None  Family Communication: None at bedside  Severity of Illness: The appropriate patient status for this patient is INPATIENT. Inpatient status is judged to be reasonDoes: Full code able and necessary in order to provide the required intensity of service to ensure the patient's safety. The patient's presenting symptoms, physical exam findings, and initial radiographic and laboratory data in the context of their chronic comorbidities is felt to place them at high risk for  further clinical deterioration. Furthermore, it is not anticipated that the patient will be medically stable for discharge from the hospital within 2 midnights of admission.   * I certify that at the point of admission it is my clinical judgment that the patient will require inpatient hospital care spanning beyond 2 midnights from the point of admission due to high intensity of service, high risk for further deterioration and high frequency of surveillance required.*  Author: Frankey Shown, DO 11/19/2022 9:06 PM  For on call review [email or web address]. CODE STATUS

## 2022-11-19 NOTE — ED Provider Triage Note (Signed)
Emergency Medicine Provider Triage Evaluation Note  Christina Byrd , a 57 y.o. female  was evaluated in triage.  Pt complains of numbness and tingling of her LUE and LLE for one week. Also having nausea and vomiting for the past week. Was drinking last night. Several shots per night. Having a tough time moving LUE 2/2 arthritis of wrist, shoulder, and elbow. This has also been going on for the past week.   Review of Systems  Positive: Numbness and tingling, n/v Negative: Fevers, diarrhea  Physical Exam  BP 112/79   Pulse (!) 117   Temp 99 F (37.2 C)   Resp 18   Ht 5\' 2"  (1.575 m)   Wt 51 kg   LMP 08/23/2011   SpO2 96%   BMI 20.56 kg/m  Gen:   Awake, no distress, no tremor or tongue fasiculations Resp:  Normal effort  MSK:   LUE and LLE strength limited 2/2 pain, RUE and LLE 5/5 strength. Intact sensation to light touch in all extremities  Medical Decision Making  Medically screening exam initiated at 4:04 PM.  Appropriate orders placed.  SHARESA KEMP was informed that the remainder of the evaluation will be completed by another provider, this initial triage assessment does not replace that evaluation, and the importance of remaining in the ED until their evaluation is complete.   Rondel Baton, MD 11/19/22 [phone]

## 2022-11-19 NOTE — ED Provider Notes (Signed)
EMERGENCY DEPARTMENT AT [facility] Provider Note   CSN: [id number] Arrival date & time: 11/19/22  1549     History  Chief Complaint  Patient presents with   Tingling    Christina Byrd is a 57 y.o. female with past medical history GERD, HTN, seizure, EtOH use presenting to the emergency room with nausea, generalized shaking and left upper extremity tingling and weakness as well as diffuse left lower extremity numbness and tingling.  Pt reports symptoms started approximately 1 week.  When her symptoms started she did not have associated headache.  Her symptoms have gradually worsened throughout the week.  She also reports left shoulder pain that is consistent with her baseline arthritis pain. Denies HA, CP, SOB, Abd pain, V/D.  Patient reports that ever since 2 months ago when she got hard teeth pulled she has had trouble eating and concern for mouth infection.  She was prescribed an antibiotic from our ER 9/10.  No recent falls, trauma.  Patient reports she drinks every day 3-4 shots of liquor, last drink was last night. Denies IVDU, reports former smoker.   HPI     Home Medications Prior to Admission medications   Medication Sig Start Date End Date Taking? Authorizing Provider  acetaminophen (TYLENOL) 500 MG tablet Take 2 tablets (1,000 mg total) by mouth every 8 (eight) hours as needed for moderate pain. 09/02/22 09/02/23  [name], DO  allopurinol (ZYLOPRIM) 100 MG tablet Take 100 mg by mouth daily. Pt takes 1 tablet daily.    [provider]  atorvastatin (LIPITOR) 20 MG tablet Take 20 mg by mouth daily. Pt takes 1 tablet daily.    [provider]  Blood Glucose Monitoring Suppl (TRUE METRIX METER) DEVI 1 kit by Does not apply route 3 (three) times daily. CHECK BLOOD SUGAR UP TO 3 TIMES DAILY. E11.9 02/27/19   Hoy Register, MD  diclofenac Sodium (VOLTAREN) 1 % GEL Apply 4 g topically as needed (Apply to skin as needed for joint  pain). 09/02/22   [name], DO  ferrous sulfate 325 (65 FE) MG tablet Take 1 tablet (325 mg total) by mouth 2 (two) times daily with a meal. 06/05/22   Leroy Sea, MD  folic acid (FOLVITE) 1 MG tablet Take 1 tablet (1 mg total) by mouth daily. 06/05/22   Leroy Sea, MD  levETIRAcetam (KEPPRA) 500 MG tablet Take 1 tablet (500 mg total) by mouth 2 (two) times daily. 07/13/22 11/10/22  Rai, Delene Ruffini, MD  Multiple Vitamin (MULTIVITAMIN WITH MINERALS) TABS tablet Take 1 tablet by mouth daily.    [provider]  naproxen (NAPROSYN) 500 MG tablet Take 1 tablet (500 mg total) by mouth 2 (two) times daily. 11/02/22   Jeanelle Malling, PA  ondansetron (ZOFRAN-ODT) 4 MG disintegrating tablet 4mg  ODT q4 hours prn nausea/vomit 10/22/22   Melene Plan, DO  ondansetron (ZOFRAN-ODT) 4 MG disintegrating tablet Take 1 tablet (4 mg total) by mouth every 8 (eight) hours as needed. 11/02/22   Jeanelle Malling, PA  pantoprazole (PROTONIX) 40 MG tablet Take 1 tablet (40 mg total) by mouth daily. 11/03/22   Nooruddin, Jason Fila, MD  TRUEplus Lancets 28G MISC SMARTSIG:Topical 1 to 3 Times Daily 03/06/20   Hoy Register, MD  metFORMIN (GLUCOPHAGE) 500 MG tablet Take 0.5 tablets (250 mg total) by mouth daily with breakfast. 03/05/20 04/02/20  Anders Simmonds, PA-C      Allergies    Losartan potassium, Levaquin [levofloxacin in d5w],  Ace inhibitors, Codeine, Cefepime, Chlorhexidine, and Vancomycin    Review of Systems   Review of Systems  Neurological:  Positive for numbness.    Physical Exam Updated Vital Signs BP 112/79   Pulse (!) 117   Temp 99 F (37.2 C)   Resp 18   Ht 5\' 2"  (1.575 m)   Wt 51 kg   LMP 08/23/2011   SpO2 96%   BMI 20.56 kg/m  Physical Exam Vitals and nursing note reviewed.  Constitutional:      General: She is not in acute distress.    Appearance: She is not toxic-appearing.  HENT:     Head: Normocephalic and atraumatic.  Eyes:     General: No scleral icterus.     Conjunctiva/sclera: Conjunctivae normal.  Cardiovascular:     Rate and Rhythm: Normal rate and regular rhythm.     Pulses: Normal pulses.     Heart sounds: Normal heart sounds.  Pulmonary:     Effort: Pulmonary effort is normal. No respiratory distress.     Breath sounds: Normal breath sounds.  Abdominal:     General: Abdomen is flat. Bowel sounds are normal. There is no distension.     Palpations: Abdomen is soft. There is no mass.     Tenderness: There is no abdominal tenderness. There is no guarding.  Skin:    General: Skin is warm and dry.     Capillary Refill: Capillary refill takes less than 2 seconds.     Findings: No lesion.  Neurological:     Mental Status: She is alert and oriented to person, place, and time. Mental status is at baseline.     Cranial Nerves: No cranial nerve deficit.     Sensory: Sensory deficit present.     Motor: Weakness present.     Comments: Decreased strength left upper extremity.  Patient is not able to hold arm up against gravity.  Strong radial pulse.  Reports decreased sensation of left hand when compared bilaterally.  Equal strength and equal sensation of lower extremity. Strong dorsal pedal pulse.      ED Results / Procedures / Treatments   Labs (all labs ordered are listed, but only abnormal results are displayed) Labs Reviewed  COMPREHENSIVE METABOLIC PANEL  LIPASE, BLOOD  CBC WITH DIFFERENTIAL/PLATELET  URINALYSIS, ROUTINE W REFLEX MICROSCOPIC  ETHANOL  RAPID URINE DRUG SCREEN, HOSP PERFORMED    EKG None  Radiology No results found.  Procedures Procedures    Medications Ordered in ED Medications  ondansetron (ZOFRAN-ODT) disintegrating tablet 4 mg (has no administration in time range)    ED Course/ Medical Decision Making/ A&P                                 Medical Decision Making Amount and/or Complexity of Data Reviewed Labs: ordered. Radiology: ordered.  Risk OTC drugs. Prescription drug  management. Decision regarding hospitalization.   This patient presents to the ED for concern of left arm tingling and weakness, this involves an extensive number of treatment options, and is a complaint that carries with it a high risk of complications and morbidity.  The differential diagnosis includes electrolyte abnormality, dehydration, stroke, alcohol withdrawal   Co morbidities that complicate the patient evaluation  Alcohol abuse GERD Hypertension Seizure disorder Arthritis   Additional history obtained:  Additional history obtained from husband at bedside   Lab Tests:  I personally interpreted labs.  The pertinent  results include:   Cbc normal wbc, hgb 8.7  Cmp 5.7 adjusted calcium 6.6, creatinine 1.96 elevated from baseline) recent previous labs.  Magnesium 0.8 ordered IV magnesium and contacted hospitalist about the result. Lipase 78 patient is not having any abdominal pain at this time and appears to be chronically elevated for her. Ethanol 128, reports last drink was this last evening. PTH, Mg, Ionized Calcium ordered and pending    Imaging Studies ordered:  I ordered imaging studies including CT head  I independently visualized and interpreted imaging which showed no acute intracranial abnormalities  I agree with the radiologist interpretation  Ordered MR brain, however patient refused.  Offered sedation, patient still declined.   Cardiac Monitoring: / EKG:  The patient was maintained on a cardiac monitor.  I personally viewed and interpreted the cardiac monitored which showed an underlying rhythm of: sinus tachycardia    Consultations Obtained:  I requested consultation with the hospitalist,  and discussed lab and imaging findings as well as pertinent plan - they recommend: Recommend admission, agree admission for hypokalemia and AKI.   Problem List / ED Course / Critical interventions / Medication management  Patient reporting to the emergency room  with paresthesia, generalized weakness.  Found to have left upper extremity weakness.  Difficulty discerning whether weakness was secondary to left shoulder pain which is chronic for her.  CT of head was ordered which did not show any acute intercranial abnormalities.  Lab work shows calcium 5.7, patients presentation consistent with symptomatic hypocalcemia.  Patient also has elevated creatinine from baseline. Thus consulted hospital ist for admission for hypocalcemia and AKI.  I ordered medication including Calcium gluconate, NS  for hypocalcemia CIWA, step down ordered due to history of EtOH abuse Reevaluation of the patient after these medicines showed that the patient stayed the same.  I have reviewed the patients home medicines and have made adjustments as needed   Plan  Admit for AKI and hypocalcemia symptomatic          Final Clinical Impression(s) / ED Diagnoses Final diagnoses:  Hypocalcemia  AKI (acute kidney injury) [facility])    Rx / DC Orders ED Discharge Orders     None         Raford Pitcher Evalee Jefferson 11/19/22 2011    Rondel Baton, MD 11/23/22 [phone]

## 2022-11-20 ENCOUNTER — Inpatient Hospital Stay (HOSPITAL_COMMUNITY): Payer: BLUE CROSS/BLUE SHIELD

## 2022-11-20 LAB — COMPREHENSIVE METABOLIC PANEL
ALT: 17 U/L (ref 0–44)
ALT: 14 U/L (ref 0–44)
AST: 41 U/L (ref 15–41)
AST: 30 U/L (ref 15–41)
Albumin: 2.5 g/dL — ABNORMAL LOW (ref 3.5–5.0)
Albumin: 2.3 g/dL — ABNORMAL LOW (ref 3.5–5.0)
Alkaline Phosphatase: 55 U/L (ref 38–126)
Alkaline Phosphatase: 46 U/L (ref 38–126)
Anion gap: 14 (ref 5–15)
Anion gap: 7 (ref 5–15)
BUN: 14 mg/dL (ref 6–20)
BUN: 11 mg/dL (ref 6–20)
CO2: 17 mmol/L — ABNORMAL LOW (ref 22–32)
CO2: 22 mmol/L (ref 22–32)
Calcium: 5.8 mg/dL — CL (ref 8.9–10.3)
Calcium: 6 mg/dL — CL (ref 8.9–10.3)
Chloride: 106 mmol/L (ref 98–111)
Chloride: 111 mmol/L (ref 98–111)
Creatinine, Ser: 1.76 mg/dL — ABNORMAL HIGH (ref 0.44–1.00)
Creatinine, Ser: 1.3 mg/dL — ABNORMAL HIGH (ref 0.44–1.00)
GFR, Estimated: 33 mL/min — ABNORMAL LOW (ref >60)
GFR, Estimated: 48 mL/min — ABNORMAL LOW (ref >60)
Glucose, Bld: 201 mg/dL — ABNORMAL HIGH (ref 70–99)
Glucose, Bld: 113 mg/dL — ABNORMAL HIGH (ref 70–99)
Potassium: 3.2 mmol/L — ABNORMAL LOW (ref 3.5–5.1)
Potassium: 3.7 mmol/L (ref 3.5–5.1)
Sodium: 137 mmol/L (ref 135–145)
Sodium: 140 mmol/L (ref 135–145)
Total Bilirubin: 0.9 mg/dL (ref 0.3–1.2)
Total Bilirubin: 1 mg/dL (ref 0.3–1.2)
Total Protein: 5.8 g/dL — ABNORMAL LOW (ref 6.5–8.1)
Total Protein: 5.3 g/dL — ABNORMAL LOW (ref 6.5–8.1)

## 2022-11-20 LAB — URINALYSIS, ROUTINE W REFLEX MICROSCOPIC
Bilirubin Urine: NEGATIVE
Glucose, UA: NEGATIVE mg/dL
Ketones, ur: NEGATIVE mg/dL
Nitrite: NEGATIVE
Protein, ur: NEGATIVE mg/dL
Specific Gravity, Urine: 1.009 (ref 1.005–1.030)
pH: 5 (ref 5.0–8.0)

## 2022-11-20 LAB — CBC
HCT: 23 % — ABNORMAL LOW (ref 36.0–46.0)
Hemoglobin: 7.8 g/dL — ABNORMAL LOW (ref 12.0–15.0)
MCH: 31.6 pg (ref 26.0–34.0)
MCHC: 33.9 g/dL (ref 30.0–36.0)
MCV: 93.1 fL (ref 80.0–100.0)
Platelets: 239 10*3/uL (ref 150–400)
RBC: 2.47 MIL/uL — ABNORMAL LOW (ref 3.87–5.11)
RDW: 15.9 % — ABNORMAL HIGH (ref 11.5–15.5)
WBC: 8.2 10*3/uL (ref 4.0–10.5)
nRBC: 0 % (ref 0.0–0.2)

## 2022-11-20 LAB — RAPID URINE DRUG SCREEN, HOSP PERFORMED
Amphetamines: NOT DETECTED
Barbiturates: NOT DETECTED
Benzodiazepines: NOT DETECTED
Cocaine: NOT DETECTED
Opiates: NOT DETECTED
Tetrahydrocannabinol: NOT DETECTED

## 2022-11-20 LAB — GLUCOSE, CAPILLARY: Glucose-Capillary: 185 mg/dL — ABNORMAL HIGH (ref 70–99)

## 2022-11-20 LAB — PHOSPHORUS: Phosphorus: 2.3 mg/dL — ABNORMAL LOW (ref 2.5–4.6)

## 2022-11-20 LAB — MAGNESIUM
Magnesium: 2.3 mg/dL (ref 1.7–2.4)
Magnesium: 1.7 mg/dL (ref 1.7–2.4)

## 2022-11-20 LAB — MRSA NEXT GEN BY PCR, NASAL: MRSA by PCR Next Gen: NOT DETECTED

## 2022-11-20 MED ORDER — LEVETIRACETAM 500 MG PO TABS
750.0000 mg | ORAL_TABLET | Freq: Two times a day (BID) | ORAL | Status: DC
  Administered 2022-11-20 – 2022-11-22 (×4): 750 mg via ORAL
  Filled 2022-11-20 (×5): qty 1

## 2022-11-20 MED ORDER — AMLODIPINE BESYLATE 10 MG PO TABS
10.0000 mg | ORAL_TABLET | Freq: Every day | ORAL | Status: DC
  Filled 2022-11-20: qty 1

## 2022-11-20 MED ORDER — FOLIC ACID 1 MG PO TABS
1.0000 mg | ORAL_TABLET | Freq: Every day | ORAL | Status: DC
  Administered 2022-11-20 – 2022-11-22 (×3): 1 mg via ORAL
  Filled 2022-11-20 (×3): qty 1

## 2022-11-20 MED ORDER — ORAL CARE MOUTH RINSE: 15.0000 mL | OROMUCOSAL | Status: DC | PRN

## 2022-11-20 MED ORDER — DIPHENHYDRAMINE HCL 50 MG/ML IJ SOLN
25.0000 mg | Freq: Once | INTRAMUSCULAR | Status: AC
  Administered 2022-11-20: 25 mg via INTRAVENOUS
  Filled 2022-11-20: qty 1

## 2022-11-20 MED ORDER — SODIUM CHLORIDE 0.9 % IV SOLN: INTRAVENOUS | Status: DC

## 2022-11-20 MED ORDER — LEVETIRACETAM IN NACL 500 MG/100ML IV SOLN
500.0000 mg | INTRAVENOUS | Status: AC
  Administered 2022-11-20: 500 mg via INTRAVENOUS
  Filled 2022-11-20: qty 100

## 2022-11-20 MED ORDER — LEVETIRACETAM IN NACL 500 MG/100ML IV SOLN
500.0000 mg | Freq: Two times a day (BID) | INTRAVENOUS | Status: DC
  Filled 2022-11-20: qty 100

## 2022-11-20 MED ORDER — CALCIUM GLUCONATE-NACL 1-0.675 GM/50ML-% IV SOLN
1.0000 g | Freq: Once | INTRAVENOUS | Status: AC
  Administered 2022-11-20: 1000 mg via INTRAVENOUS
  Filled 2022-11-20: qty 50

## 2022-11-20 MED ORDER — SODIUM CHLORIDE 0.9 % IV BOLUS
500.0000 mL | Freq: Once | INTRAVENOUS | Status: AC
  Administered 2022-11-20: 500 mL via INTRAVENOUS

## 2022-11-20 MED ORDER — AMLODIPINE BESYLATE 10 MG PO TABS
10.0000 mg | ORAL_TABLET | Freq: Every day | ORAL | Status: DC
  Administered 2022-11-20 – 2022-11-22 (×3): 10 mg via ORAL
  Filled 2022-11-20 (×3): qty 1

## 2022-11-20 MED ORDER — LORAZEPAM 2 MG/ML IJ SOLN: 2.0000 mg | INTRAMUSCULAR | Status: DC | PRN

## 2022-11-20 MED ORDER — POTASSIUM CHLORIDE 10 MEQ/100ML IV SOLN
10.0000 meq | INTRAVENOUS | Status: AC
  Administered 2022-11-20 (×3): 10 meq via INTRAVENOUS
  Filled 2022-11-20 (×3): qty 100

## 2022-11-20 MED ORDER — LORAZEPAM 2 MG/ML IJ SOLN
2.0000 mg | Freq: Once | INTRAMUSCULAR | Status: AC
  Administered 2022-11-20: 2 mg via INTRAVENOUS
  Filled 2022-11-20: qty 1

## 2022-11-20 MED ORDER — LORAZEPAM 2 MG/ML IJ SOLN
2.0000 mg | INTRAMUSCULAR | Status: DC | PRN
  Filled 2022-11-20: qty 1

## 2022-11-20 NOTE — Progress Notes (Signed)
Rapid Response Event Note   Reason for Call : Unwitnessed fall with seizure activity   Initial Focused Assessment: arrived to find pt in the floor not currently seizing.  Oriented x 1 but lethargic.  Able to follow simple commands.  Pupils intact.  2+ pulses. Pt is weak but equal strength noted in extremities   Denies pian.  No obvious busing, noted.  Pt helped back to bed.  TRIAD , NP at bedside.      Interventions: VS per flowsheet.  EKG complete.  See new multiple orders received and initiated.  .      Plan of Care: Pt will transfer to Progressive floor for closer for closer monitoring,  Pending CT scan.   Event Summary:   MD Notified: yes Call Time: 0005 Arrival Time: 0009 End Time: 0040  Conley Rolls, RN

## 2022-11-20 NOTE — Progress Notes (Signed)
Bed alarm for patient alarmed. Nurse tech immediately went to patient's room. Nurse tech voiced that the patient fell, unwitnessed. The primary nurse for this patient followed nurse tech. This nurse immediately following nurse and nurse tech observed patient lying on the floor with head on the bottom of the IV pole and appeared to be having a seizure. This nurse gently placed patient's head on a pillow on the floor and adjusted patient to her side. While doing so, rapid response was called and the hospitalist was informed of situation and arrived at bedside. The rapid response nurse immediately arrived to bedside. Patient's seizure last approximately 1-2 minutes. Patient then aroused and able to place patient in bed with assistance of other nurses.   Seizure precautions were implemented.  Initially, fall precautions were in place for this patient. Bed alarm on, three rails up per protocol.   6962: Nurse tech informed this nurse of patient. Patient appeared to have a second seizure, witnessed by family, nurse tech and primary nurse. Primary  nurse informed hospitalist. This nurse followed orders to administer Ativan. Patient then escorted by nursing staff to CT for a scan and then immediately transferred to ICU.

## 2022-11-20 NOTE — Plan of Care (Signed)
Plan of care, goals, CIWA, and neuro assessment discussed with patient and her husband, time given for questions and answers, Patient agitated, impulsive and forgetful, husband instructed to use call bell if wife is trying to get out off bed. Patient handbook/guide at bedside, Tele- Sitter activated. Due to patient being high fall risk and S/P fall,  bed alarm on exiting, in lowest position, wheels locked and call bell with husband, side rails padded for seizure precautions.  Problem: Activity: Goal: Ability to tolerate increased activity will improve Outcome: Progressing   Problem: Respiratory: Goal: Ability to maintain a clear airway and adequate ventilation will improve Outcome: Progressing   Problem: Role Relationship: Goal: Method of communication will improve Outcome: Progressing   Problem: Education: Goal: Knowledge of General Education information will improve Description: Including pain rating scale, medication(s)/side effects and non-pharmacologic comfort measures Outcome: Progressing   Problem: Health Behavior/Discharge Planning: Goal: Ability to manage health-related needs will improve Outcome: Progressing   Problem: Clinical Measurements: Goal: Ability to maintain clinical measurements within normal limits will improve Outcome: Progressing Goal: Will remain free from infection Outcome: Progressing Goal: Diagnostic test results will improve Outcome: Progressing Goal: Respiratory complications will improve Outcome: Progressing Goal: Cardiovascular complication will be avoided Outcome: Progressing   Problem: Activity: Goal: Risk for activity intolerance will decrease Outcome: Progressing   Problem: Nutrition: Goal: Adequate nutrition will be maintained Outcome: Progressing   Problem: Coping: Goal: Level of anxiety will decrease Outcome: Progressing   Problem: Elimination: Goal: Will not experience complications related to bowel motility Outcome:  Progressing Goal: Will not experience complications related to urinary retention Outcome: Progressing   Problem: Pain Managment: Goal: General experience of comfort will improve Outcome: Progressing   Problem: Safety: Goal: Ability to remain free from injury will improve Outcome: Progressing   Problem: Skin Integrity: Goal: Risk for impaired skin integrity will decrease Outcome: Progressing

## 2022-11-20 NOTE — Progress Notes (Signed)
Returned Ativan (2mg /66ml) vial pulled from pyxis on 6E to Pharmacist at this time. Pyxis would not allow return of medication.

## 2022-11-20 NOTE — Consult Note (Signed)
NEUROLOGY CONSULTATION NOTE   Date of service: November 20, 2022 Patient Name: Christina Byrd MRN:  [id number] DOB:  1965/12/25 Reason for consult: "seizures" Requesting Provider: Rhetta Mura, MD _ _ _   _ __   _ __ _ _  __ __   _ __   __ _  History of Present Illness  Christina Byrd is a 57 y.o. female with PMH significant for  has a past medical history of GASTROESOPHAGEAL REFLUX, NO ESOPHAGITIS (04/21/2006), GERD (gastroesophageal reflux disease) (Dx 1995), HTN (hypertension), and Seizures (HCC). who presents with  multiple seizures in setting of ETOH use. Required transfer to ICU. Two seizures yesterday.  No seizures overnight or today. Getting keppra 750mg  BID. On CIWA protocol.   She has no complaints. Doing well. Drinks daily. Says she is complaint with her medications.   Question if she needs LTM or further workup is reason for consultation.    ROS   Constitutional Denies weight loss, fever and chills.   HEENT Denies changes in vision and hearing.   Respiratory Denies SOB and cough.   CV Denies palpitations and CP   GI Denies abdominal pain, nausea, vomiting and diarrhea.   GU Denies dysuria and urinary frequency.   MSK Denies myalgia and joint pain.   Skin Denies rash and pruritus.   Neurological Denies headache and syncope.   Psychiatric Denies recent changes in mood. Denies anxiety and depression.    Past History   Past Medical History:  Diagnosis Date   GASTROESOPHAGEAL REFLUX, NO ESOPHAGITIS 04/21/2006   Qualifier: Diagnosis of  By: Levada Schilling     GERD (gastroesophageal reflux disease) Dx 1995   HTN (hypertension)    Seizures (HCC)    Past Surgical History:  Procedure Laterality Date   BIOPSY  06/03/2022   Procedure: BIOPSY;  Surgeon: Jenel Lucks, MD;  Location: [facility] ENDOSCOPY;  Service: Gastroenterology;;   ESOPHAGOGASTRODUODENOSCOPY (EGD) WITH PROPOFOL N/A 06/03/2022   Procedure: ESOPHAGOGASTRODUODENOSCOPY (EGD) WITH PROPOFOL;   Surgeon: Jenel Lucks, MD;  Location: [facility] ENDOSCOPY;  Service: Gastroenterology;  Laterality: N/A;   HOT HEMOSTASIS N/A 06/03/2022   Procedure: HOT HEMOSTASIS (ARGON PLASMA COAGULATION/BICAP);  Surgeon: Jenel Lucks, MD;  Location: [facility] ENDOSCOPY;  Service: Gastroenterology;  Laterality: N/A;   Family History  Problem Relation Age of Onset   CAD Mother    Social History   Socioeconomic History   Marital status: Single    Spouse name: Not on file   Number of children: Not on file   Years of education: Not on file   Highest education level: Not on file  Occupational History   Not on file  Tobacco Use   Smoking status: Former    Current packs/day: 0.00    Types: Cigarettes    Quit date: 06/25/2012    Years since quitting: 10.4   Smokeless tobacco: Never  Vaping Use   Vaping status: Never Used  Substance and Sexual Activity   Alcohol use: Not Currently    Alcohol/week: 49.0 standard drinks of alcohol    Types: 14 Cans of beer, 35 Shots of liquor per week   Drug use: No   Sexual activity: Not Currently  Other Topics Concern   Not on file  Social History Narrative   Pt lives in single story home with her partner   Has 1 son   5 grandchildren   10th grade education   Works at [employer].    Right handed   Social Determinants  of Health   Financial Resource Strain: Not on file  Food Insecurity: No Food Insecurity (07/27/2022)   Hunger Vital Sign    Worried About Running Out of Food in the Last Year: Never true    Ran Out of Food in the Last Year: Never true  Transportation Needs: No Transportation Needs (07/27/2022)   PRAPARE - Administrator, Civil Service (Medical): No    Lack of Transportation (Non-Medical): No  Physical Activity: Not on file  Stress: Not on file  Social Connections: Not on file   Allergies  Allergen Reactions   Losartan Potassium     Rash   Levaquin [Levofloxacin In D5w] Rash   Ace Inhibitors Cough    REACTION: cough    Codeine Nausea Only   Cefepime Rash    09/04/12 pm Patient started to break out with small macules after IV Vanco infusion, then macules increased in size after starting cefepime infusion.   Chlorhexidine Itching and Rash   Vancomycin Rash    09/04/12 pm Patient started to break out with small macules after IV Vanco infusion, then macules increased in size after starting cefepime infusion.    Medications   Medications Prior to Admission  Medication Sig Dispense Refill Last Dose   acetaminophen (TYLENOL) 500 MG tablet Take 2 tablets (1,000 mg total) by mouth every 8 (eight) hours as needed for moderate pain. 100 tablet 2    allopurinol (ZYLOPRIM) 100 MG tablet Take 100 mg by mouth daily. Pt takes 1 tablet daily.      amLODipine (NORVASC) 10 MG tablet Take 10 mg by mouth daily.      atorvastatin (LIPITOR) 20 MG tablet Take 20 mg by mouth daily. Pt takes 1 tablet daily.      diclofenac Sodium (VOLTAREN) 1 % GEL Apply 4 g topically as needed (Apply to skin as needed for joint pain). 4 g 3    ferrous sulfate 325 (65 FE) MG tablet Take 1 tablet (325 mg total) by mouth 2 (two) times daily with a meal. 60 tablet 0    folic acid (FOLVITE) 1 MG tablet Take 1 tablet (1 mg total) by mouth daily. 30 tablet 0    levETIRAcetam (KEPPRA) 500 MG tablet Take 1 tablet (500 mg total) by mouth 2 (two) times daily. 60 tablet 3    Multiple Vitamin (MULTIVITAMIN WITH MINERALS) TABS tablet Take 1 tablet by mouth daily.      naproxen (NAPROSYN) 500 MG tablet Take 1 tablet (500 mg total) by mouth 2 (two) times daily. 30 tablet 0    ondansetron (ZOFRAN-ODT) 4 MG disintegrating tablet Take 1 tablet (4 mg total) by mouth every 8 (eight) hours as needed. 20 tablet 0    pantoprazole (PROTONIX) 40 MG tablet Take 1 tablet (40 mg total) by mouth daily. 90 tablet 3      Vitals   Vitals:   11/20/22 0700 11/20/22 1200 11/20/22 1300 11/20/22 1400  BP: 120/82 111/74  127/72  Pulse: 97 100  91  Resp: 14   14  Temp:   98.3 F  (36.8 C)   TempSrc:   Axillary   SpO2: 100%   100%  Weight:      Height:         Body mass index is 22.09 kg/m.  Physical Exam   General: Laying comfortably in bed; in no acute distress.  HENT: Normal oropharynx and mucosa. Normal external appearance of ears and nose.  CV: non labored.  Neurologic Examination  Mental status/Cognition: Alert, oriented to self, place, but not year.  Speech/language: Fluent, comprehension intact, no aphasia.  Cranial nerves: PERRL, EOMI. No droop. Motor: non focal.  Sensory: normal to LT.  Gait: deferral.   Labs   CBC:  Recent Labs  Lab 11/19/22 1637 11/20/22 0058  WBC 5.8 8.2  NEUTROABS 2.5  --   HGB 8.7* 7.8*  HCT 25.3* 23.0*  MCV 92.3 93.1  PLT 318 239    Basic Metabolic Panel:  Lab Results  Component Value Date   NA 140 11/20/2022   K 3.7 11/20/2022   CO2 22 11/20/2022   GLUCOSE 113 (H) 11/20/2022   BUN 11 11/20/2022   CREATININE 1.30 (H) 11/20/2022   CALCIUM 6.0 (LL) 11/20/2022   GFRNONAA 48 (L) 11/20/2022   GFRAA 61 01/02/2020   Lipid Panel:  Lab Results  Component Value Date   LDLCALC 48 04/27/2022   HgbA1c:  Lab Results  Component Value Date   HGBA1C 5.0 07/11/2022   Urine Drug Screen:     Component Value Date/Time   LABOPIA NONE DETECTED 11/20/2022 0457   COCAINSCRNUR NONE DETECTED 11/20/2022 0457   LABBENZ NONE DETECTED 11/20/2022 0457   AMPHETMU NONE DETECTED 11/20/2022 0457   THCU NONE DETECTED 11/20/2022 0457   LABBARB NONE DETECTED 11/20/2022 0457    Alcohol Level     Component Value Date/Time   ETH 128 (H) 11/19/2022 1638   INR  Lab Results  Component Value Date   INR 1.1 07/13/2022   APTT No results found for: "APTT"   CT Head without contrast(Personally reviewed): Neg.  Impression   ALVEDA PARDEN is a 57 y.o. female with PMH significant for EOTH use, seizures, non complaints. Her neurologic examination is notable for mild confusion but maybe due to CIWA protocol.    Primary Diagnosis:  Status epilepticus.  Secondary Diagnosis: EOTH abuse  Recommendations   Con't keppra 750mg  BID. Transition to PO.  Q 4 neuro checks Transition out of ICU.  Seizure precautions.  CIWA protcol and ativan taper.   SEIZURE PRECAUTIONS Per [facility] statutes, patients with seizures are not allowed to drive until they have been seizure-free for six months.   Use caution when using heavy equipment or power tools. Avoid working on ladders or at heights. Take showers instead of baths. Ensure the water temperature is not too high on the home water heater. Do not go swimming alone. Do not lock yourself in a room alone (i.e. bathroom). When caring for infants or small children, sit down when holding, feeding, or changing them to minimize risk of injury to the child in the event you have a seizure. Maintain good sleep hygiene. Avoid alcohol.    If patient has another seizure, call 911 and bring them back to the ED if: A.  The seizure lasts longer than 5 minutes.      B.  The patient doesn't wake shortly after the seizure or has new problems such as difficulty seeing, speaking or moving following the seizure C.  The patient was injured during the seizure D.  The patient has a temperature over 102 F (39C) E.  The patient vomited during the seizure and now is having trouble breathing  ______________________________________________________________________   Thank you for the opportunity to take part in the care of this patient. If you have any further questions, please contact the neurology consultation attending.  Signed,   _ _ _   _ __   _ __  _ _  __ __   _ __   __ _

## 2022-11-20 NOTE — Progress Notes (Signed)
Patient lethargic and waking only to pain stimulus. Once awakened she begins to yell at nurse to " leave me alone" and " why the hell you hurting my chest"  patient will follow commands during this time but will immediately fall back asleep. Patient unable to take medication at this time. MD made aware.   Christina Byrd

## 2022-11-20 NOTE — Progress Notes (Signed)
Pt found on floor after bed alarm sounded. Unwitnessed fall with seizure like activity noted. Rapid response/Np called to bedside. AAOx1 at this time. New orders received and implemented.  CT scan ordered, with patient refusing to go secondary to claustrophobia. Pt c/o new rt shoulder pain.  0137:  Called to room by tech, pt having a 2nd seizure, witnessed by family. NP notified, new orders for Ativan IV received and administered as ordered. Pt taken for head CT at this time and transferred to ICU.

## 2022-11-20 NOTE — Progress Notes (Addendum)
Patient Name: Christina Byrd           DOB: [birth year]  MRN: [id number]      Admission Date: 11/19/2022  Attending Provider: Frankey Shown, DO  Primary Diagnosis: Hypocalcemia   Level of care: Progressive    CROSS COVER NOTE   Date of Service   11/20/2022   Christina Byrd, 57 y.o. female, was admitted on 11/19/2022 for Hypocalcemia.    HPI/Events of Note   Unwitnessed fall Seizure Found on the floor by nursing staff shortly after bed alarm rang. Patient was seen seizing for <1 minute.  Seizure activity stopped without medical intervention.  She was able to wake up easily with stimulation. No urinary incontinence, trauma to oral cavity, or aspiration noted.  Hx of seizures on Keppra. Had a recent ICU due to noncompliant with home seizure medications due to alcohol consumption.  Patient denies pain or injury at this time, no visible sign of injury or deformities. Patient was assisted back to bed without complications. Skin is intact.    Bedside evaluation Patient is lethargic, A/O x1, responds to voice.  No associated distress.  She was previously A/O x 4.  Respiratory: Clear bilaterally, no wheezing or crackles. Normal effort. No accessory muscle use.  Cardiovascular: Regular rate and rhythm, no murmurs / rubs / gallops. No extremity edema.  Abdomen: Abdomen is soft and nontender. Positive bowel sounds noted in all quadrants.   Neuro: A/Ox 1.  Confused to location and situation.  Follows simple commands PERRLA, facial symmetry, clear speech. ROM symmetrical, but weak (noted on admission)    Addendum 0120- patient back to baseline per RN, A/O x 4.   Complains of right shoulder pain.  Refusing head CT.   0137- Seizure seen by family, also "short duration" per family. Pt given 2 mg ativan. Taken to CT stat. Transferring to SDU.    0230-  A/O to self and place. Some confusion and restlessness.  Mildly lethargic after ativan, but alert to voice and touch.  Follows simple commands.  Clear speech. PERRLA.  Weak, but symmetrical strength to upper and lower extremities.    0400- Secure chat sent to neurology for sz management recommendations.  Dr. [name] recommends loading pt with 1000mg  IV Keppra,  and increase maintenance Keppra to 750mg  BID.     Interventions/ Plan   CT head -  No acute intracranial abnormality. Xray right shoulder-  pending Labs-CBC, CMP, magnesium, phosphorus  IV Keppra, 1,000 mg total Keppra 750 BID EKG- Sinus tachycardia without ST segment changes Telesitter  Q1 neuro checks Ativan PRN for seizures Replete calcium and potassium  Nursing staff to continue monitoring for change in acute symptoms, mobility, and pain.       Anthoney Harada, DNP, ACNPC- AG [facility]

## 2022-11-20 NOTE — Progress Notes (Signed)
HOSPITALIST ROUNDING NOTE Christina Byrd UVO:[id number]  DOB: 22-Apr-1965  DOA: 11/19/2022  PCP: [name], DO (this is a teaching service patient to IM [facility]) 11/20/2022,7:11 AM   LOS: 1 day      Code Status: Full code   From: Home  current Dispo: Unclear     57 year old black female Chronic EtOH + prior withdrawals-seizure disorder 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  (Keppra?) HTN, reflux, chronic anemia?  2/2 angioectasia of stomach duodenum-EGD 05/2022 Dr. [name] CKD 3 A, prior AGMA Prior DM TY 2 no longer on oral agents History of gout Chronic medication noncompliance  Last hospitalization 8/5-09/30/18/2024 delirium tremens acute ethanol withdrawal Came to ED 11/02/2022 dental pain-and was sent home-followed up in [facility] clinic that same day reported pharyngeal dysphagia and was rereferred back to Dr. [name] In the interim apparently had several teeth pulled  Brought back to emergency room 11/19/2022 left upper extremity tingling weakness + left lower extremity tingling weakness worsening symptoms during the week Because of paresthesias generalized weakness imaging was done showing CT head without any intracranial abnormality calcium was 5.7 consistent with symptomatic hypocalcemia hospitalist was consulted   Endorses continued EtOH 2-4 shots of liquor + soda last drink being 9/27 PM Tachycardic bicarb 18 creatinine up 1.96 (baseline 0.8-1.2) calcium 5.7 lipase 78 magnesium 0.8 EtOH 128 CT head no abnormality Rx calcium gluconate  Overnight postadmission 9/28 hide generalized tonic-clonic seizure transferred to ICU Neurology consulted Dr. [name] loaded 1000 mg IV Keppra-maintenance increased to 750 twice daily Multiple imaging studies and labs are pending   Plan  Generalized tonic-clonic seizure in the setting of prior epilepsy--- probably metabolic etiology to patient's seizures with electrolyte abnormalities Loaded as above-continue 750 twice daily Keppra-as no further seizure  activity will allow a soft diet and oral meds and await further input from neurology service Patient refuses MRI because of claustrophobia-defer to neurology if they wish CTA Unclear if needs LTM Was given Benadryl and Ativan last earlier this morning is less confused now  Chronic ethanolism drinks half a gallon every several days EtOH level128 on arrival CIWA 4-5 but is determined to get out of bed  Patient has been redirected several times and we will try to relieve restraints if she is cooperative  Accidental fall in the setting of seizure CT head negative, right shoulder x-ray pending at this time Continues to try to get up out of bed butwas able to get to the bedside commode with standby assist  Hypomagnesemia, AKI, metabolic acidosis and hypocalcemia All suspected secondary to ethanolism Corrected calcium is 7.4 12 AM received several doses of IV calcium gluconate needs repeat labs ASAP and will adjust meds Continue NS currently 75 cc/H  Normocytic anemia-baseline hemoglobin is 8-10 currently 7.8 with volume repletion Monitor for dark stool-transfusion structural not met-resume IV iron and folic acid etc. in the outpatient setting and if hemoglobin remains low may need iron studies  recent dental extractions Soft diet ordered--will need outpatient follow-up with teaching service  Gout Allopurinol 100 daily-does not require colchicine at this time as no acute flare-discontinue Naprosyn given prior GI bleed    Prior GI bleed with angioectasias 05/2022 [facility] Continue PPI Protonix 40 daily  DVT prophylaxis: SCD  Status is: Inpatient Remains inpatient appropriate because:   Requires close monitoring  Subjective: Agitated according to nursing earlier this morning trying to get out of bed pulled out IVs She is somewhat redirectable but does not know the year-tells me that this is October-.  Who the  president is but names Trump instead of the current president Husband is  at bedside who corroborates the history of stiffening overnight without tongue biting etc.-it is not clear how compliant she is on her seizure or other meds  Objective + exam Vitals:   11/20/22 0300 11/20/22 0400 11/20/22 0500 11/20/22 0600  BP: (!) 141/93 137/88 (!) 135/97 118/82  Pulse: (!) 127 (!) 118 99 96  Resp: 17 18 18 14   Temp: 98.3 F (36.8 C)     TempSrc: Oral     SpO2: 100% 100% 100% 100%  Weight: 54.6 kg     Height: 5' 1.89" (1.572 m)      Filed Weights   11/19/22 1558 11/20/22 0300  Weight: 51 kg 54.6 kg    Examination:  EOMI NCAT no focal deficit Cachectic black female-poor dentition-no tongue lacerations noted on limited exam Neck soft Chest is clear Abdomen is soft without HSM Her right shoulder is quite tender to palpation over bicipital groove-she is able to raise her arms minimally but is able to raise both legs Reflexes are brisk in knees  Data Reviewed: reviewed   CBC    Component Value Date/Time   WBC 8.2 11/20/2022 0058   RBC 2.47 (L) 11/20/2022 0058   HGB 7.8 (L) 11/20/2022 0058   HGB 12.4 04/27/2022 1125   HCT 23.0 (L) 11/20/2022 0058   HCT 37.6 04/27/2022 1125   PLT 239 11/20/2022 0058   PLT 210 04/27/2022 1125   MCV 93.1 11/20/2022 0058   MCV 97 04/27/2022 1125   MCH 31.6 11/20/2022 0058   MCHC 33.9 11/20/2022 0058   RDW 15.9 (H) 11/20/2022 0058   RDW 17.0 (H) 04/27/2022 1125   LYMPHSABS 2.8 11/19/2022 1637   LYMPHSABS 1.5 04/27/2022 1125   MONOABS 0.3 11/19/2022 1637   EOSABS 0.0 11/19/2022 1637   EOSABS 0.0 04/27/2022 1125   BASOSABS 0.1 11/19/2022 1637   BASOSABS 0.0 04/27/2022 1125      Latest Ref Rng & Units 11/20/2022   12:58 AM 11/19/2022    4:37 PM 11/02/2022    9:12 PM  CMP  Glucose 70 - 99 mg/dL 409  77  811   BUN 6 - 20 mg/dL 14  16  10    Creatinine 0.44 - 1.00 mg/dL 9.14  7.82  9.56   Sodium 135 - 145 mmol/L 137  138  140   Potassium 3.5 - 5.1 mmol/L 3.2  3.5  3.2   Chloride 98 - 111 mmol/L 106  106  100   CO2 22  - 32 mmol/L 17  18  29    Calcium 8.9 - 10.3 mg/dL 5.8  5.7  8.1   Total Protein 6.5 - 8.1 g/dL 5.8  6.4  7.1   Total Bilirubin 0.3 - 1.2 mg/dL 0.9  0.8  0.9   Alkaline Phos 38 - 126 U/L 55  54  64   AST 15 - 41 U/L 41  39  28   ALT 0 - 44 U/L 17  16  10       Scheduled Meds:  allopurinol  100 mg Oral Daily   atorvastatin  20 mg Oral Daily   calcium carbonate  1 tablet Oral BID WC   feeding supplement  237 mL Oral BID BM   folic acid  1 mg Oral Daily   heparin  5,000 Units Subcutaneous Q8H   levETIRAcetam  750 mg Oral BID   LORazepam  0-4 mg Intravenous Q4H   Followed by   [  START ON 11/21/2022] LORazepam  0-4 mg Intravenous Q8H   multivitamin with minerals  1 tablet Oral Daily   ondansetron  4 mg Oral Once   pantoprazole  40 mg Oral Daily   thiamine  100 mg Oral Daily   Or   thiamine  100 mg Intravenous Daily   Continuous Infusions:  sodium chloride 75 mL/hr at 11/20/22 0630    Time 45  Rhetta Mura, MD  [facility]

## 2022-11-20 NOTE — Progress Notes (Addendum)
   11/20/22 0015  What Happened  Was fall witnessed? No  Was patient injured? Unsure  Patient found on floor  Found by Staff-comment  Stated prior activity to/from bed, chair, or stretcher  Provider Notification  Provider Name/Title Anthoney Harada, NP  Date Provider Notified 11/20/22  Time Provider Notified 0010  Method of Notification Page  Notification Reason Fall  Provider response At bedside  Date of Provider Response 11/20/22  Time of Provider Response 0015  Follow Up  Family notified Yes - comment  Time family notified 0024  Additional tests Yes-comment  Progress note created (see row info) Yes  Adult Fall Risk Assessment  Risk Factor Category (scoring not indicated) High fall risk per protocol (document High fall risk)  Patient Fall Risk Level High fall risk  Adult Fall Risk Interventions  Required Bundle Interventions *See Row Information* High fall risk - low, moderate, and high requirements implemented  Additional Interventions Use of appropriate toileting equipment (bedpan, BSC, etc.)  Fall intervention(s) refused/Patient educated regarding refusal Yellow bracelet  Screening for Fall Injury Risk (To be completed on HIGH fall risk patients) - Assessing Need for Floor Mats  Risk For Fall Injury- Criteria for Floor Mats None identified - No additional interventions needed  Neurological  Neuro (WDL) X  Orientation Level Oriented to place  Speech Slurred/Dysarthria  Glasgow Coma Scale  Eye Opening 3  Best Verbal Response (NON-intubated) 4  Best Motor Response 6  Glasgow Coma Scale Score 13     11/20/22 0024  Screening for Fall Injury Risk (To be completed on HIGH fall risk patients) - Assessing Need for Floor Mats  Risk For Fall Injury- Criteria for Floor Mats Previous fall this admission  Will Implement Floor Mats Yes  Vitals  Temp 98.2 F (36.8 C)  Temp Source Oral  BP 131/82  MAP (mmHg) 98  BP Location Right Arm  BP Method Automatic  Patient Position (if  appropriate) Lying  Pulse Rate (!) 113  Pulse Rate Source Monitor  Resp 14  Oxygen Therapy  SpO2 98 %  O2 Device Room Air

## 2022-11-21 ENCOUNTER — Inpatient Hospital Stay (HOSPITAL_COMMUNITY): Payer: BLUE CROSS/BLUE SHIELD

## 2022-11-21 LAB — COMPREHENSIVE METABOLIC PANEL
ALT: 16 U/L (ref 0–44)
AST: 49 U/L — ABNORMAL HIGH (ref 15–41)
Albumin: 2.2 g/dL — ABNORMAL LOW (ref 3.5–5.0)
Alkaline Phosphatase: 75 U/L (ref 38–126)
Anion gap: 8 (ref 5–15)
BUN: 8 mg/dL (ref 6–20)
CO2: 21 mmol/L — ABNORMAL LOW (ref 22–32)
Calcium: 6.4 mg/dL — CL (ref 8.9–10.3)
Chloride: 112 mmol/L — ABNORMAL HIGH (ref 98–111)
Creatinine, Ser: 1.2 mg/dL — ABNORMAL HIGH (ref 0.44–1.00)
GFR, Estimated: 53 mL/min — ABNORMAL LOW (ref >60)
Glucose, Bld: 142 mg/dL — ABNORMAL HIGH (ref 70–99)
Potassium: 3.4 mmol/L — ABNORMAL LOW (ref 3.5–5.1)
Sodium: 141 mmol/L (ref 135–145)
Total Bilirubin: 0.7 mg/dL (ref 0.3–1.2)
Total Protein: 5.4 g/dL — ABNORMAL LOW (ref 6.5–8.1)

## 2022-11-21 LAB — MAGNESIUM
Magnesium: 1.2 mg/dL — ABNORMAL LOW (ref 1.7–2.4)
Magnesium: 2 mg/dL (ref 1.7–2.4)

## 2022-11-21 MED ORDER — DIAZEPAM 2 MG PO TABS
2.0000 mg | ORAL_TABLET | ORAL | Status: AC
  Administered 2022-11-21: 2 mg via ORAL
  Filled 2022-11-21: qty 1

## 2022-11-21 MED ORDER — ADULT MULTIVITAMIN W/MINERALS CH
1.0000 | ORAL_TABLET | Freq: Every day | ORAL | Status: DC
  Administered 2022-11-21 – 2022-11-22 (×2): 1 via ORAL
  Filled 2022-11-21 (×2): qty 1

## 2022-11-21 MED ORDER — ONDANSETRON 4 MG PO TBDP: 4.0000 mg | ORAL_TABLET | Freq: Four times a day (QID) | ORAL | Status: DC | PRN

## 2022-11-21 MED ORDER — LOPERAMIDE HCL 2 MG PO CAPS: 2.0000 mg | ORAL_CAPSULE | ORAL | Status: DC | PRN

## 2022-11-21 MED ORDER — CHLORDIAZEPOXIDE HCL 25 MG PO CAPS
25.0000 mg | ORAL_CAPSULE | Freq: Four times a day (QID) | ORAL | Status: DC | PRN
  Administered 2022-11-21 (×2): 25 mg via ORAL
  Filled 2022-11-21 (×2): qty 1

## 2022-11-21 MED ORDER — MAGNESIUM SULFATE 4 GM/100ML IV SOLN
4.0000 g | Freq: Once | INTRAVENOUS | Status: AC
  Administered 2022-11-21: 4 g via INTRAVENOUS
  Filled 2022-11-21: qty 100

## 2022-11-21 MED ORDER — CALCIUM GLUCONATE-NACL 1-0.675 GM/50ML-% IV SOLN
1.0000 g | Freq: Once | INTRAVENOUS | Status: AC
  Administered 2022-11-21: 1000 mg via INTRAVENOUS
  Filled 2022-11-21: qty 50

## 2022-11-21 MED ORDER — HYDROXYZINE HCL 25 MG PO TABS
25.0000 mg | ORAL_TABLET | Freq: Four times a day (QID) | ORAL | Status: DC | PRN
  Administered 2022-11-21: 25 mg via ORAL
  Filled 2022-11-21: qty 1

## 2022-11-21 MED ORDER — THIAMINE HCL 100 MG/ML IJ SOLN
100.0000 mg | Freq: Once | INTRAMUSCULAR | Status: DC
  Filled 2022-11-21: qty 2

## 2022-11-21 NOTE — Progress Notes (Addendum)
HOSPITALIST ROUNDING NOTE Christina Byrd ZOX:[id number]  DOB: 1965/11/14  DOA: 11/19/2022  PCP: [name], DO (this is a teaching service patient to IM [facility]) 11/21/2022,4:39 PM   LOS: 2 days      Code Status: Full code   From: Home  current Dispo: Unclear     57 year old black female Chronic EtOH + prior withdrawals-seizure disorder 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  (Keppra?) HTN, reflux, chronic anemia?  2/2 angioectasia of stomach duodenum-EGD 05/2022 Dr. [name] CKD 3 A, prior AGMA Prior DM TY 2 no longer on oral agents History of gout Chronic medication noncompliance  Last hospitalization 8/5-09/30/18/2024 delirium tremens acute ethanol withdrawal Came to ED 11/02/2022 dental pain-and was sent home-followed up in [facility] clinic that same day reported pharyngeal dysphagia and was rereferred back to Dr. [name] In the interim apparently had several teeth pulled  Brought back to emergency room 11/19/2022 left upper extremity tingling weakness + left lower extremity tingling weakness worsening symptoms during the week Because of paresthesias generalized weakness imaging was done showing CT head without any intracranial abnormality calcium was 5.7 consistent with symptomatic hypocalcemia hospitalist was consulted   continued EtOH 2-4 shots of liquor + soda last drink being 9/27 PM Tachycardic bicarb 18 creatinine up 1.96 (baseline 0.8-1.2) calcium 5.7 lipase 78 magnesium 0.8 EtOH 128 CT head no abnormality Rx calcium gluconate  9/28 hide generalized tonic-clonic seizure transferred to ICU Neurology consulted Dr. [name] loaded 1000 mg IV Keppra-maintenance increased to 750 twice daily--neurohospitalist signed off as no recurrence     Plan  Generalized tonic-clonic seizure in the setting of prior epilepsy--- exacerbated by metabolic abnormalities continue 750 twice daily Keppra-regular diet  no further seizure workup per neuro enforce compliance  Right shoulder discomfort-likely  rotator cuff tear?  Subacute Hardly able to raise-refuses MRI X2-discussed with Dr. Gaylyn Cheers and follow-up outpatient setting 7 to 14 days-physical therapy to evaluate in a.m. Not safe for discharge given recent fall and needs evaluation prior to going home-she wanted to leave [facility] 9/29 -- asked her to reconsider but we cannot prevent her from leaving  Chronic ethanolism drinks half a gallon every several days EtOH level128 on arrival Mild withdrawal keep on stepdown overnight changed to Librium protocol  Accidental fall in the setting of seizure -PT eval as above  Hypomagnesemia, AKI, metabolic acidosis and hypocalcemia All suspected secondary to ethanolism Corrected calcium still low-replacing with aggressive IV calcium gluconate received IV magnesium earlier today-replete and follow labs in the morning  Normocytic anemia-baseline hemoglobin is 8-10 currently 7.8 with volume repletion Monitor for dark stool-transfusion structural not met-resume IV iron and folic acid etc. in the outpatient setting and if hemoglobin remains low may need iron studies  recent dental extractions Regular diet ordered  Gout Allopurinol 100 daily-does not require colchicine at this time as no acute flare-discontinue Naprosyn given prior GI bleed  Prior GI bleed with angioectasias 05/2022 [facility] Continue PPI Protonix 40 daily  DVT prophylaxis: SCD  Status is: Inpatient Remains inpatient appropriate because:   Requires close monitoring  Subjective: Redirectable--however I was called back to the bedside as she wanted to leave AMA and her CIWA scores were little bit elevated this afternoon in the 7 range We decided on Librium for smoother taper-she understands that she may need an evaluation by therapy to make sure she is safe going home I would keep on stepdown overnight to ensure she has one-to-one supervision   Objective + exam Vitals:   11/21/22 1300 11/21/22 1400 11/21/22 1500  11/21/22  1600  BP: (!) 96/53 118/72 139/63 123/71  Pulse: (!) 113 (!) 113 (!) 137 (!) 138  Resp: 16 (!) 22 (!) 35 (!) 24  Temp:      TempSrc:      SpO2: 99% 100% 100% 100%  Weight:      Height:       Filed Weights   11/19/22 1558 11/20/22 0300  Weight: 51 kg 54.6 kg    Examination:  EOMI NCAT no focal deficit Cachectic black female-poor dentition- Chest clear no wheeze rales rhonchi Cannot flex the shoulder-, cannot properly internally rotate right shoulder has point tenderness over AC area and lateral deltoid grip strength is weaker on right than left Abdomen is soft no rebound Neuro is intact she is coherent  Data Reviewed: reviewed   CBC    Component Value Date/Time   WBC 8.2 11/20/2022 0058   RBC 2.47 (L) 11/20/2022 0058   HGB 7.8 (L) 11/20/2022 0058   HGB 12.4 04/27/2022 1125   HCT 23.0 (L) 11/20/2022 0058   HCT 37.6 04/27/2022 1125   PLT 239 11/20/2022 0058   PLT 210 04/27/2022 1125   MCV 93.1 11/20/2022 0058   MCV 97 04/27/2022 1125   MCH 31.6 11/20/2022 0058   MCHC 33.9 11/20/2022 0058   RDW 15.9 (H) 11/20/2022 0058   RDW 17.0 (H) 04/27/2022 1125   LYMPHSABS 2.8 11/19/2022 1637   LYMPHSABS 1.5 04/27/2022 1125   MONOABS 0.3 11/19/2022 1637   EOSABS 0.0 11/19/2022 1637   EOSABS 0.0 04/27/2022 1125   BASOSABS 0.1 11/19/2022 1637   BASOSABS 0.0 04/27/2022 1125      Latest Ref Rng & Units 11/21/2022    3:11 AM 11/20/2022    9:30 AM 11/20/2022   12:58 AM  CMP  Glucose 70 - 99 mg/dL 161  096  045   BUN 6 - 20 mg/dL 8  11  14    Creatinine 0.44 - 1.00 mg/dL 4.09  8.11  9.14   Sodium 135 - 145 mmol/L 141  140  137   Potassium 3.5 - 5.1 mmol/L 3.4  3.7  3.2   Chloride 98 - 111 mmol/L 112  111  106   CO2 22 - 32 mmol/L 21  22  17    Calcium 8.9 - 10.3 mg/dL 6.4  6.0  5.8   Total Protein 6.5 - 8.1 g/dL 5.4  5.3  5.8   Total Bilirubin 0.3 - 1.2 mg/dL 0.7  1.0  0.9   Alkaline Phos 38 - 126 U/L 75  46  55   AST 15 - 41 U/L 49  30  41   ALT 0 - 44 U/L 16  14  17        Scheduled Meds:  allopurinol  100 mg Oral Daily   amLODipine  10 mg Oral Daily   calcium carbonate  1 tablet Oral BID WC   feeding supplement  237 mL Oral BID BM   folic acid  1 mg Oral Daily   heparin  5,000 Units Subcutaneous Q8H   levETIRAcetam  750 mg Oral BID   multivitamin with minerals  1 tablet Oral Daily   ondansetron  4 mg Oral Once   pantoprazole  40 mg Oral Daily   thiamine  100 mg Intramuscular Once   Continuous Infusions:  sodium chloride 75 mL/hr at 11/21/22 1200    Time 25  Rhetta Mura, MD  [facility]

## 2022-11-21 NOTE — Plan of Care (Signed)
  Problem: Activity: Goal: Ability to tolerate increased activity will improve Outcome: Progressing   Problem: Respiratory: Goal: Ability to maintain a clear airway and adequate ventilation will improve Outcome: Progressing   Problem: Clinical Measurements: Goal: Will remain free from infection Outcome: Progressing Goal: Respiratory complications will improve Outcome: Progressing Goal: Cardiovascular complication will be avoided Outcome: Progressing

## 2022-11-21 NOTE — Plan of Care (Signed)
  Problem: Activity: Goal: Ability to tolerate increased activity will improve Outcome: Progressing   Problem: Respiratory: Goal: Ability to maintain a clear airway and adequate ventilation will improve Outcome: Progressing   Problem: Role Relationship: Goal: Method of communication will improve Outcome: Progressing   Problem: Education: Goal: Knowledge of General Education information will improve Description: Including pain rating scale, medication(s)/side effects and non-pharmacologic comfort measures Outcome: Progressing

## 2022-11-21 NOTE — Progress Notes (Signed)
CROSS COVER NOTE  NAME: Christina Byrd MRN: [id number] DOB : 1965-03-29    Concern as stated by nurse / staff   Page from RN Critical calcium 6.4     Pertinent findings on chart review: Patient with significant alcohol use history and seizure disorder admitted with delirium tremens acute alcohol withdrawal  Assessment and  Interventions   Assessment:    11/21/2022    5:00 AM 11/21/2022    4:00 AM 11/21/2022    3:00 AM  Vitals with BMI  Systolic 103 105 578  Diastolic 58 49 74  Pulse 104 114 110       Latest Ref Rng & Units 11/21/2022    3:11 AM 11/20/2022    9:30 AM 11/20/2022   12:58 AM  BMP  Glucose 70 - 99 mg/dL 469  629  528   BUN 6 - 20 mg/dL 8  11  14    Creatinine 0.44 - 1.00 mg/dL 4.13  2.44  0.10   Sodium 135 - 145 mmol/L 141  140  137   Potassium 3.5 - 5.1 mmol/L 3.4  3.7  3.2   Chloride 98 - 111 mmol/L 112  111  106   CO2 22 - 32 mmol/L 21  22  17    Calcium 8.9 - 10.3 mg/dL 6.4  6.0  5.8    Mag  1.2 Albumin 2.2 Corrected calcium 7.8 Plan: 4 gm mag stat 1 gm calcium gluconate Repeat mag level at 1300      Donnie Mesa NP [facility] Cross Cover 7pm-7am - check amion for availability Pager [phone]

## 2022-11-21 NOTE — Progress Notes (Signed)
Patient states that she refuses MRI due to claustrophobia. Patient is alert and oriented but easily agitated. Offered MRI a second time and patient agreed as long as she received medication. Once MRI was contacted for the second attempt, patient refused once more and stated she would Leave AMA. MD made aware and En route to Bedside.    Christina Byrd

## 2022-11-21 NOTE — TOC Initial Note (Signed)
Transition of Care [facility]) - Initial/Assessment Note    Patient Details  Name: Christina Byrd MRN: [id number] Date of Birth: [birth year]  Transition of Care [facility]) CM/SW Contact:    Adrian Prows, RN Phone Number: 11/21/2022, 1:04 PM  Clinical Narrative:                 [facility] consult for SA counseling/education; spoke w/ pt and her  S.O. Lenetta Quaker in room; pt says she is from home and plans to return at d/c; pt says she has transportation; she denies SDOH risks; pt says she does not have DME, HH services, or home oxygen; pt says she is supposed to have dentures but they are broken, and were never replaced; pt decline resources for SA counseling/education; TOC will follow.   Expected Discharge Plan: Home/Self Care Barriers to Discharge: Continued Medical Work up   Patient Goals and CMS Choice Patient states their goals for this hospitalization and ongoing recovery are:: home          Expected Discharge Plan and Services   Discharge Planning Services: CM Consult   Living arrangements for the past 2 months: Single Family Home                                      Prior Living Arrangements/Services Living arrangements for the past 2 months: Single Family Home Lives with:: Significant Other Patient language and need for interpreter reviewed:: Yes Do you feel safe going back to the place where you live?: Yes      Need for Family Participation in Patient Care: Yes (Comment) Care giver support system in place?: Yes (comment) Current home services:  (n/a) Criminal Activity/Legal Involvement Pertinent to Current Situation/Hospitalization: No - Comment as needed  Activities of Daily Living      Permission Sought/Granted Permission sought to share information with : Case Manager Permission granted to share information with : Yes, Verbal Permission Granted  Share Information with NAME: Case Manager     Permission granted to share info w Relationship: Lenetta Quaker ([place]) [phone]     Emotional Assessment Appearance:: Appears stated age Attitude/Demeanor/Rapport: Gracious Affect (typically observed): Accepting Orientation: : Oriented to Self, Oriented to Place, Oriented to  Time, Oriented to Situation Alcohol / Substance Use: Alcohol Use Psych Involvement: No (comment)  Admission diagnosis:  Hypocalcemia [E83.51] AKI (acute kidney injury) (HCC) [N17.9] Patient Active Problem List   Diagnosis Date Noted   Hypocalcemia 11/19/2022   AKI (acute kidney injury) (HCC) 11/19/2022   Hypoalbuminemia due to protein-calorie malnutrition (HCC) 11/19/2022   Mixed hyperlipidemia 11/19/2022   Elevated lipase 11/19/2022   Hypotension 10/21/2022   Seizure (HCC) 09/27/2022   Osteoarthritis 09/02/2022   History of noncompliance with medical treatment 07/27/2022   Hepatic steatosis 07/27/2022   Hypokalemia 07/27/2022   High anion gap metabolic acidosis 07/27/2022   Coffee ground emesis 07/26/2022   Acute pain of right knee 07/08/2022   Transaminitis 07/08/2022   Iron deficiency anemia 07/08/2022   Alcohol withdrawal (HCC) 07/07/2022   GI bleed 06/03/2022   Alcohol abuse 06/03/2022   Duodenal ulcer hemorrhage 06/03/2022   AVM (arteriovenous malformation) of stomach, 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   Acute blood loss anemia 06/03/2022   Pharyngoesophageal dysphagia 03/28/2020   Cervicalgia 03/28/2020   Hot flashes 01/30/2020   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   Hypomagnesemia 03/14/2018   Depression 07/08/2016  Seizure disorder (HCC) 07/08/2016   Cocaine use    Hot flash, menopausal 06/22/2016   Intertrigo 06/22/2016   Gout of left ankle 10/09/2015   Left hip pain 09/02/2015   Alcohol use disorder 09/02/2015   Colonoscopy refused 09/02/2015   Hyperuricemia 05/27/2015   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   Left shoulder pain 04/25/2015   Pain and swelling of left ankle 12/02/2014    Tendinitis of right hip flexor 07/23/2014   Allergic rhinitis 05/13/2014   GERD without esophagitis 04/01/2014   Tendonitis, Achilles, right 04/01/2014   Osteoarthritis of left knee 12/11/2013   Ex-smoker 11/09/2008   OBESITY 08/12/2006   HYPERTENSION, BENIGN ESSENTIAL 08/12/2006   PCP:  [name], DO Pharmacy:   [id number] [place], [place] - [address] AT [facility] OF [address] [place] [address] Phone: [phone] Fax: [phone]  [id number] - [place], [place] - [address] AT [facility] OF [address] [facility] [address] [place] [address] Phone: [phone] Fax: [phone]  [facility] [address] [place] [address] Phone: [phone] Fax: [phone]     Social Determinants of Health (SDOH) Social History: SDOH Screenings   Food Insecurity: No Food Insecurity (11/21/2022)  Housing: Low Risk  (11/21/2022)  Transportation Needs: No Transportation Needs (11/21/2022)  Utilities: Not At Risk (11/21/2022)  Depression (PHQ2-9): Low Risk  (09/02/2022)  Tobacco Use: Medium Risk (11/19/2022)   SDOH Interventions: Food Insecurity Interventions: Intervention Not Indicated, Inpatient TOC Housing Interventions: Intervention Not Indicated, Inpatient TOC Transportation Interventions: Intervention Not Indicated, Inpatient TOC Utilities Interventions: Intervention Not Indicated, Inpatient TOC   Readmission Risk Interventions    11/21/2022    1:01 PM  Readmission Risk Prevention Plan  Transportation Screening Complete  Medication Review (RN Care Manager) Complete  PCP or Specialist appointment within 3-5 days of discharge Complete  HRI or Home Care Consult Complete  Palliative Care Screening Not Applicable  Skilled Nursing Facility Not Applicable

## 2022-11-22 LAB — COMPREHENSIVE METABOLIC PANEL
ALT: 19 U/L (ref 0–44)
AST: 57 U/L — ABNORMAL HIGH (ref 15–41)
Albumin: 2.1 g/dL — ABNORMAL LOW (ref 3.5–5.0)
Alkaline Phosphatase: 77 U/L (ref 38–126)
Anion gap: 7 (ref 5–15)
BUN: 5 mg/dL — ABNORMAL LOW (ref 6–20)
CO2: 24 mmol/L (ref 22–32)
Calcium: 7 mg/dL — ABNORMAL LOW (ref 8.9–10.3)
Chloride: 111 mmol/L (ref 98–111)
Creatinine, Ser: 0.92 mg/dL (ref 0.44–1.00)
GFR, Estimated: >60 mL/min (ref >60)
Glucose, Bld: 128 mg/dL — ABNORMAL HIGH (ref 70–99)
Potassium: 3.9 mmol/L (ref 3.5–5.1)
Sodium: 142 mmol/L (ref 135–145)
Total Bilirubin: 0.6 mg/dL (ref 0.3–1.2)
Total Protein: 5.2 g/dL — ABNORMAL LOW (ref 6.5–8.1)

## 2022-11-22 LAB — CALCIUM, IONIZED
Calcium, Ionized, Serum: 3.4 mg/dL — ABNORMAL LOW (ref 4.5–5.6)
Calcium, Ionized, Serum: 3.4 mg/dL — ABNORMAL LOW (ref 4.5–5.6)

## 2022-11-22 LAB — PARATHYROID HORMONE, INTACT (NO CA): PTH: 86 pg/mL — ABNORMAL HIGH (ref 15–65)

## 2022-11-22 LAB — OCCULT BLOOD X 1 CARD TO LAB, STOOL: Fecal Occult Bld: NEGATIVE

## 2022-11-22 LAB — MAGNESIUM: Magnesium: 1.4 mg/dL — ABNORMAL LOW (ref 1.7–2.4)

## 2022-11-22 MED ORDER — MAGNESIUM OXIDE 400 MG PO TABS: 800.0000 mg | ORAL_TABLET | Freq: Two times a day (BID) | ORAL | 0 refills | Status: AC

## 2022-11-22 MED ORDER — FERROUS SULFATE 325 (65 FE) MG PO TABS: 325.0000 mg | ORAL_TABLET | Freq: Two times a day (BID) | ORAL | 2 refills | Status: DC

## 2022-11-22 MED ORDER — OYSTER SHELL CALCIUM/D3 500-5 MG-MCG PO TABS: 1.0000 | ORAL_TABLET | Freq: Two times a day (BID) | ORAL | 0 refills | Status: DC

## 2022-11-22 MED ORDER — FOLIC ACID 1 MG PO TABS: 1.0000 mg | ORAL_TABLET | Freq: Every day | ORAL | 2 refills | Status: DC

## 2022-11-22 MED ORDER — MAGNESIUM SULFATE IN D5W 1-5 GM/100ML-% IV SOLN
1.0000 g | Freq: Once | INTRAVENOUS | Status: AC
  Administered 2022-11-22: 1 g via INTRAVENOUS
  Filled 2022-11-22: qty 100

## 2022-11-22 MED ORDER — ATORVASTATIN CALCIUM 20 MG PO TABS: 20.0000 mg | ORAL_TABLET | Freq: Every day | ORAL | 2 refills | Status: DC

## 2022-11-22 MED ORDER — LEVETIRACETAM 750 MG PO TABS: 750.0000 mg | ORAL_TABLET | Freq: Two times a day (BID) | ORAL | 2 refills | Status: DC

## 2022-11-22 NOTE — Plan of Care (Signed)
  Problem: Health Behavior/Discharge Planning: Goal: Ability to manage health-related needs will improve Outcome: Not Progressing   Problem: Coping: Goal: Level of anxiety will decrease Outcome: Not Progressing   Problem: Pain Managment: Goal: General experience of comfort will improve Outcome: Not Progressing   

## 2022-11-22 NOTE — Evaluation (Signed)
Physical Therapy Evaluation Patient Details Name: Christina Byrd MRN: [id number] DOB: 09-19-1965 Today's Date: 11/22/2022  History of Present Illness  Patient is a 57 year old female who presented to the hospital on 9/27 with LUE weakness. CT was unremarkable. 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 PMH: gout, GI bleed, CKD, DM  Clinical Impression  Pt admitted with above diagnosis.  Pt currently with functional limitations due to the deficits listed below (see PT Problem List). Pt will benefit from acute skilled PT to increase their independence and safety with mobility to allow discharge.     The patient ambulated x 220' with no device and contact guard. Patient hopes to Dc  today       If plan is discharge home, recommend the following: A little help with walking and/or transfers;A little help with bathing/dressing/bathroom;Assistance with cooking/housework;Help with stairs or ramp for entrance   Can travel by private vehicle        Equipment Recommendations None recommended by PT  Recommendations for Other Services       Functional Status Assessment Patient has had a recent decline in their functional status and demonstrates the ability to make significant improvements in function in a reasonable and predictable amount of time.     Precautions / Restrictions Precautions Precautions: Fall Precaution Comments: sz Restrictions Weight Bearing Restrictions: No      Mobility  Bed Mobility Overal bed mobility: Modified Independent                  Transfers Overall transfer level: Needs assistance Equipment used: None Transfers: Sit to/from Stand Sit to Stand: Supervision                Ambulation/Gait Ambulation/Gait assistance: Supervision Gait Distance (Feet): 220 Feet Assistive device: None Gait Pattern/deviations: Step-through pattern Gait velocity: decr     General  Gait Details: no LOB  Stairs            Wheelchair Mobility     Tilt Bed    Modified Rankin (Stroke Patients Only)       Balance Overall balance assessment: Mild deficits observed, not formally tested                                           Pertinent Vitals/Pain Pain Assessment Faces Pain Scale: Hurts a little bit Pain Location: BUE from IVs Pain Descriptors / Indicators: Discomfort Pain Intervention(s): Monitored during session    Home Living Family/patient expects to be discharged to:: Private residence Living Arrangements: Spouse/significant other Available Help at Discharge: Family;Available 24 hours/day Type of Home: House Home Access: Stairs to enter Entrance Stairs-Rails: Right;Left;Can reach both Entrance Stairs-Number of Steps: 3-4   Home Layout: One level Home Equipment: Agricultural consultant (2 wheels);Shower seat;Crutches Additional Comments: states she can get RW easily    Prior Function Prior Level of Function : Independent/Modified Independent             Mobility Comments: no falls, I gait with no AD ADLs Comments: independent, 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 declined  to participate in FF. LUE Deficits / Details: noted to have a large ammount of edema on forearm patient reported IV was there. declined for therapist to attempt to touch it to assess PROM. able to use during functional tasks.    Lower Extremity Assessment Lower Extremity Assessment: Overall WFL for tasks assessed       Communication   Communication Communication: No apparent difficulties  Cognition Arousal: Alert Behavior  During Therapy: Flat affect Overall Cognitive Status: No family/caregiver present to determine baseline cognitive functioning                                 General Comments: patient was cooperative durig session pt eager to transition home. had a hard time with the date and noted to joke to cover deficits. patient reported today was sunday sept [id number]        General Comments      Exercises     Assessment/Plan    PT Assessment Patient needs continued PT services  PT Problem List Decreased activity tolerance;Decreased mobility       PT Treatment Interventions DME instruction;Therapeutic activities;Gait training;Functional mobility training    PT Goals (Current goals can be found in the Care Plan section)  Acute Rehab PT Goals Patient Stated Goal: home PT Goal Formulation: With patient Time For Goal Achievement: 12/06/22 Potential to Achieve Goals: Good    Frequency Min 1X/week     Co-evaluation PT/OT/SLP Co-Evaluation/Treatment: Yes Reason for Co-Treatment: For patient/therapist safety;Necessary to address cognition/behavior during functional activity PT goals addressed during session: Mobility/safety with mobility OT goals addressed during session: ADL's and self-care       AM-PAC PT "6 Clicks" Mobility  Outcome Measure Help needed turning from your back to your side while in a flat bed without using bedrails?: None Help needed moving from lying on your back to sitting on the side of a flat bed without using bedrails?: None Help needed moving to and from a bed to a chair (including a wheelchair)?: None Help needed standing up from a chair using your arms (e.g., wheelchair or bedside chair)?: None Help needed to walk in hospital room?: A Little Help needed climbing 3-5 steps with a railing? : A Little 6 Click Score: 22    End of Session Equipment Utilized During Treatment: Gait belt Activity Tolerance: Patient tolerated treatment well Patient  left: in chair;with call bell/phone within reach;with chair alarm set Nurse Communication: Mobility status PT Visit Diagnosis: Unsteadiness on feet (R26.81);Muscle weakness (generalized) (M62.81)    Time: 9562-1308 PT Time Calculation (min) (ACUTE ONLY): 18 min   Charges:   PT Evaluation $PT Eval Low Complexity: 1 Low   PT General Charges $$ ACUTE PT VISIT: 1 Visit         Blanchard Kelch PT Acute Rehabilitation Services Office [phone] Weekend [phone]   Rada Hay 11/22/2022, 1:26 PM

## 2022-11-22 NOTE — Discharge Summary (Signed)
Physician Discharge Summary  Christina Byrd:[id number] DOB: 1965-08-24 DOA: 11/19/2022  PCP: Christina James, DO  Internal medicine teaching service patient  Admit date: 11/19/2022 Discharge date: 11/22/2022  Time spent: 27 minutes  Recommendations for Outpatient Follow-up:  Check magnesium calcium Chem-12 1 week giving 1 week replacement of Os-Cal Mag-Ox etc. Repeat BC on follow-up-had some dilutional anemia and may require IV iron set up based on iron studies in the outpatient setting when obtained--- I do not see that she is ever had a colonoscopy and she is 57 years old-this may need to be set up-CC Dr. Tomasa Byrd Please do pill check and enforce compliance on Keppra etc.--- she has a history of chronic noncompliance and is at high risk for readmission and should be readmitted if hospital care is needed at [facility] where her primary physician is Home health PT ordered Please check A1c in the outpatient setting is no longer on any glycemic control  Discharge Diagnoses:  MAIN problem for hospitalization   Seizure Electrolyte disturbances Anemia Chronic noncompliance CKD 3 AA  Please see below for itemized issues addressed in HOpsital- refer to other progress notes for clarity if needed  Discharge Condition: Guarded if continues to drink  Diet recommendation: Regular potentially diabetic  Filed Weights   11/19/22 1558 11/20/22 0300  Weight: 51 kg 54.6 kg    History of present illness:  57 year old black female Chronic EtOH + prior withdrawals-seizure disorder 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  (Keppra?) HTN, reflux, chronic anemia?  2/2 angioectasia of stomach duodenum-EGD 05/2022 Dr. Tomasa Byrd CKD 3 A, prior AGMA Prior DM TY 2 no longer on oral agents History of gout Chronic medication noncompliance   Last hospitalization 8/5-09/30/18/2024 delirium tremens acute ethanol withdrawal Came to ED 11/02/2022 dental pain-and was sent home-followed up in IM [facility] clinic that same day  reported pharyngeal dysphagia and was rereferred back to Dr. Tomasa Byrd In the interim apparently had several teeth pulled   Brought back to emergency room 11/19/2022 left upper extremity tingling weakness + left lower extremity tingling weakness worsening symptoms during the week Because of paresthesias generalized weakness imaging was done showing CT head without any intracranial abnormality calcium was 5.7 consistent with symptomatic hypocalcemia hospitalist was consulted    continued EtOH 2-4 shots of liquor + soda last drink being 9/27 PM Tachycardic bicarb 18 creatinine up 1.96 (baseline 0.8-1.2) calcium 5.7 lipase 78 magnesium 0.8 EtOH 128 CT head no abnormality Rx calcium gluconate   9/28 hide generalized tonic-clonic seizure transferred to ICU Neurology consulted Dr. Derry Byrd loaded 1000 mg IV Keppra-maintenance increased to 750 twice daily--neurohospitalist signed off as no recurrence  Hospital Course:  Generalized tonic-clonic seizure in the setting of prior epilepsy--- exacerbated by metabolic abnormalities She had left-sided weakness on arrival but imaging was negative for stroke she refused MRI brain as she was claustrophobic and neurology did not feel any further workup was needed continue 750 twice daily Keppra-regular diet  no further seizure workup per neuro  enforce compliance with higher dose of Keppra and need to make sure that electrolytes are checked as below  Resolved severe hypocalcemia hypomagnesemia Secondary to wasting from chronic ethanolism-corrected calcium around 8.5 and although still low can be replaced with p.o. in the outpatient Received several boluses of IV magnesium and will need a check of magnesium calcium in the outpatient setting in about 1 week at the clinic--- CC PCP with internal medicine service at [facility] for follow-up   Right shoulder discomfort-likely rotator cuff tear?  Subacute Hardly able  to raise-refuses MRI X2-discussed with Dr.  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 EtOH level128 on arrival Patient to Librium protocol but she stopped withdrawing and was able to go home on 9/30   Accidental fall in the setting of seizure -PT eval as above we will see if she needs home health and I will order home health PT  Normocytic anemia-baseline hemoglobin is 8-10 currently 7.8 with volume repletion She was noncompliant on all of her meds all of her vitamins it is unclear whether she was taking them and I refill them for her She has predominant normocytic anemia likely worsened from not taking meds-I have explained to her that her iron and folic acid are important to take she received several doses of IV thiamine here  recent dental extractions Regular diet ordered and she is tolerating she will need follow-up in the outpatient setting   Gout Allopurinol 100 daily-does not require colchicine at this time as no acute flare-discontinue Naprosyn given prior GI bleed   Prior GI bleed with angioectasias 05/2022 [facility] Continue PPI Protonix 40 daily-has chronic dysphagia may require outpatient follow-up with Dr. Tomasa Byrd GI    Discharge Exam: Vitals:   11/22/22 1000 11/22/22 1012  BP: 94/61   Pulse: (!) 120 (!) 110  Resp: 14 15  Temp:    SpO2: 93% 99%    Subj on day of d/c   Coherent , wants to go home-is actually quite insistent about going home  General Exam on discharge  EOMI poor dentition No icterus no pallor Chest clear S1-S2 no murmur Abdomen soft no rebound Range of motion significantly improved to the right shoulder with internal and external rotation  Discharge Instructions   Discharge Instructions      Diet - low sodium heart healthy   Complete by: As directed    Discharge instructions   Complete by: As directed    Stop drinking.  Take supplements of calcium and magnesium Get labs in 1 week or so.  Make sure that u get follow up labs as an outpatient Remember that the dose of the keppra is now higher---do not miss doses   Increase activity slowly   Complete by: As directed       Allergies as of 11/22/2022       Reactions   Losartan Potassium    Rash   Levaquin [levofloxacin In D5w] Rash   Ace Inhibitors Cough   REACTION: cough   Codeine Nausea Only   Cefepime Rash   09/04/12 pm Patient started to break out with small macules after IV Vanco infusion, then macules increased in size after starting cefepime infusion.   Chlorhexidine Itching, Rash   Vancomycin Rash   09/04/12 pm Patient started to break out with small macules after IV Vanco infusion, then macules increased in size after starting cefepime infusion.        Medication List     STOP taking these medications    acetaminophen 500 MG tablet Commonly known as: TYLENOL   colchicine 0.6 MG tablet   diclofenac Sodium 1 % Gel Commonly known as: VOLTAREN   fenofibrate 48 MG tablet Commonly known as: TRICOR   multivitamin with minerals Tabs tablet  naproxen 500 MG tablet Commonly known as: NAPROSYN       TAKE these medications    allopurinol 100 MG tablet Commonly known as: ZYLOPRIM Take 100 mg by mouth daily. Pt takes 1 tablet daily.   amLODipine 10 MG tablet Commonly known as: NORVASC Take 10 mg by mouth daily.   atorvastatin 20 MG tablet Commonly known as: LIPITOR Take 1 tablet (20 mg total) by mouth daily. Pt takes 1 tablet daily.   calcium-vitamin D 500-5 MG-MCG tablet Commonly known as: OSCAL WITH D Take 1 tablet by mouth 2 (two) times daily for 5 days.   ferrous sulfate 325 (65 FE) MG tablet Take 1 tablet (325 mg total) by mouth 2 (two) times daily with a meal.   folic acid 1 MG  tablet Commonly known as: FOLVITE Take 1 tablet (1 mg total) by mouth daily.   levETIRAcetam 750 MG tablet Commonly known as: KEPPRA Take 1 tablet (750 mg total) by mouth 2 (two) times daily. What changed:  medication strength how much to take   magnesium oxide 400 MG tablet Commonly known as: MAG-OX Take 2 tablets (800 mg total) by mouth 2 (two) times daily for 5 days.   ondansetron 4 MG disintegrating tablet Commonly known as: ZOFRAN-ODT Take 1 tablet (4 mg total) by mouth every 8 (eight) hours as needed.   pantoprazole 40 MG tablet Commonly known as: Protonix Take 1 tablet (40 mg total) by mouth daily.       Allergies  Allergen Reactions   Losartan Potassium     Rash   Levaquin [Levofloxacin In D5w] Rash   Ace Inhibitors Cough    REACTION: cough   Codeine Nausea Only   Cefepime Rash    09/04/12 pm Patient started to break out with small macules after IV Vanco infusion, then macules increased in size after starting cefepime infusion.   Chlorhexidine Itching and Rash   Vancomycin Rash    09/04/12 pm Patient started to break out with small macules after IV Vanco infusion, then macules increased in size after starting cefepime infusion.      The results of significant diagnostics from this hospitalization (including imaging, microbiology, ancillary and laboratory) are listed below for reference.    Significant Diagnostic Studies: DG Shoulder 1V Right  Result Date: 11/20/2022 CLINICAL DATA:  Recent fall with right shoulder pain, initial encounter EXAM: RIGHT SHOULDER - 1 VIEW COMPARISON:  None Available. FINDINGS: There is no evidence of fracture or dislocation. Humeral head is somewhat high-riding with some remodeling of the acromion. This is suspicious for underlying rotator cuff tear. 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 No acute fracture is noted. Electronically Signed   By: Alcide Clever M.D.   On: 11/20/2022 03:43   DG Chest Port 1  View  Result Date: 11/20/2022 CLINICAL DATA:  Possible aspiration EXAM: PORTABLE CHEST 1 VIEW COMPARISON:  07/18/2022 FINDINGS: The heart size and mediastinal contours are within normal limits. Both lungs are clear. The visualized skeletal structures are unremarkable. IMPRESSION: No acute abnormality noted. Electronically Signed   By: Alcide Clever M.D.   On: 11/20/2022 03:43   CT HEAD WO CONTRAST ( )  Result Date: 11/20/2022 CLINICAL DATA:  Head trauma EXAM: CT HEAD WITHOUT CONTRAST TECHNIQUE: Contiguous axial images were obtained from the base of the skull through the vertex without intravenous contrast. RADIATION DOSE REDUCTION: This exam was performed according to the departmental dose-optimization program which includes automated exposure control, adjustment of the mA and/or kV according  to patient size and/or use of iterative reconstruction technique. 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 No mass, hemorrhage or extra-axial collection. Vascular: No hyperdense vessel or unexpected calcification. Skull: Normal. Negative for fracture or focal lesion. Sinuses/Orbits: No acute finding. Other: None. IMPRESSION: 1. No acute intracranial abnormality. 2. 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 Electronically Signed   By: Deatra Robinson M.D.   On: 11/20/2022 02:09   CT Head Wo Contrast  Result Date: 11/19/2022 CLINICAL DATA:  Left-sided weakness, numbness EXAM: CT HEAD WITHOUT CONTRAST TECHNIQUE: Contiguous axial images were obtained from the base of the skull through the vertex without intravenous contrast. RADIATION DOSE REDUCTION: This exam was performed according to the departmental dose-optimization program which includes automated exposure control, adjustment of the mA and/or kV according to patient size and/or use of iterative reconstruction technique. COMPARISON:  09/27/2022 FINDINGS: Brain: No acute  intracranial abnormality. Specifically, no hemorrhage, hydrocephalus, mass lesion, acute infarction, or significant intracranial injury. Vascular: No hyperdense vessel or unexpected calcification. Skull: No acute calvarial abnormality. Sinuses/Orbits: No acute findings Other: None IMPRESSION: No acute intracranial abnormality. Electronically Signed   By: Charlett Nose M.D.   On: 11/19/2022 17:26   DG Shoulder Left  Result Date: 11/02/2022 CLINICAL DATA:  Left shoulder pain. EXAM: LEFT SHOULDER - 2+ VIEW COMPARISON:  None Available. FINDINGS: There is no evidence of acute fracture or dislocation. A small chronic deformity is seen along the greater tubercle of the left humeral head. Moderate severity degenerative changes are seen involving the left acromioclavicular joint and left glenohumeral articulation. Soft tissues are unremarkable. IMPRESSION: Moderate severity degenerative changes without an acute osseous abnormality. Electronically Signed   By: Aram Candela M.D.   On: 11/02/2022 22:03    Microbiology: Recent Results (from the past 240 hour(s))  MRSA Next Gen by PCR, Nasal     Status: None   Collection Time: 11/20/22  2:15 AM   Specimen: Nasal Mucosa; Nasal Swab  Result Value Ref Range Status   MRSA by PCR Next Gen NOT DETECTED NOT DETECTED Final    Comment: (NOTE) The GeneXpert MRSA Assay (FDA approved for NASAL specimens only), is one component of a comprehensive MRSA colonization surveillance program. It is not intended to diagnose MRSA infection nor to guide or monitor treatment for MRSA infections. Test performance is not FDA approved in patients less than 68 years old. Performed at [facility], [address] [place], [place] [address]      Labs: Basic Metabolic Panel: Recent Labs  Lab 11/19/22 1637 11/19/22 1822 11/20/22 0058 11/20/22 0930 11/21/22 0311 11/21/22 1344 11/22/22 0549  NA 138  --  137 140 141  --  142  K 3.5  --  3.2* 3.7 3.4*  --   3.9  CL 106  --  106 111 112*  --  111  CO2 18*  --  17* 22 21*  --  24  GLUCOSE 77  --  201* 113* 142*  --  128*  BUN 16  --  14 11 8   --  5*  CREATININE 1.96*  --  1.76* 1.30* 1.20*  --  0.92  CALCIUM 5.7*  --  5.8* 6.0* 6.4*  --  7.0*  MG  --    < > 2.3 1.7 1.2* 2.0 1.4*  PHOS  --   --  2.3*  --   --   --   --    < > =  values in this interval not displayed.   Liver Function Tests: Recent Labs  Lab 11/19/22 1637 11/20/22 0058 11/20/22 0930 11/21/22 0311 11/22/22 0549  AST 39 41 30 49* 57*  ALT 16 17 14 16 19   ALKPHOS 54 55 46 75 77  BILITOT 0.8 0.9 1.0 0.7 0.6  PROT 6.4* 5.8* 5.3* 5.4* 5.2*  ALBUMIN 2.8* 2.5* 2.3* 2.2* 2.1*   Recent Labs  Lab 11/19/22 1637  LIPASE 78*   No results for input(s): "AMMONIA" in the last 168 hours. CBC: Recent Labs  Lab 11/19/22 1637 11/20/22 0058  WBC 5.8 8.2  NEUTROABS 2.5  --   HGB 8.7* 7.8*  HCT 25.3* 23.0*  MCV 92.3 93.1  PLT 318 239   Cardiac Enzymes: No results for input(s): "CKTOTAL", "CKMB", "CKMBINDEX", "TROPONINI" in the last 168 hours. BNP: BNP (last 3 results) Recent Labs    06/05/22 0249  BNP 42.9    ProBNP (last 3 results) No results for input(s): "PROBNP" in the last 8760 hours.  CBG: Recent Labs  Lab 11/20/22 0023  GLUCAP 185*       Signed:  Rhetta Mura MD   [facility] 11/22/2022, 11:28 AM

## 2022-11-22 NOTE — TOC Transition Note (Signed)
Transition of Care [facility]) - CM/SW Discharge Note   Patient Details  Name: MARVELINE PROFETA MRN: [id number] Date of Birth: [birth year]  [facility] CM/SW Contact:  Lavenia Atlas, RN Phone Number: 11/22/2022, 12:16 PM   Clinical Narrative:   Received TOC consult for HHPT. This RNCM spoke with patient at bedside to offer choice for HHPT. Patient reports she is not interested and declined HHPT services.  Family is at bedside to transport patient home.  No additional TOC needs.   Final next level of care: Home w Home Health Services Barriers to Discharge: No Barriers Identified   Patient Goals and CMS Choice CMS Medicare.gov Compare Post Acute Care list provided to:: Patient Choice offered to / list presented to : Patient  Discharge Placement                         Discharge Plan and Services Additional resources added to the After Visit Summary for     Discharge Planning Services: CM Consult            DME Arranged: N/A DME Agency: NA       HH Arranged: Refused HH HH Agency: [facility] Date HH Agency Contacted: 11/22/22 Time HH Agency Contacted: 1216 Representative spoke with at [facility] Agency: Haywood Lasso  Social Determinants of Health (SDOH) Interventions SDOH Screenings   Food Insecurity: No Food Insecurity (11/21/2022)  Housing: Low Risk  (11/21/2022)  Transportation Needs: No Transportation Needs (11/21/2022)  Utilities: Not At Risk (11/21/2022)  Depression (PHQ2-9): Low Risk  (09/02/2022)  Tobacco Use: Medium Risk (11/19/2022)     Readmission Risk Interventions    11/21/2022    1:01 PM  Readmission Risk Prevention Plan  Transportation Screening Complete  Medication Review (RN Care Manager) Complete  PCP or Specialist appointment within 3-5 days of discharge Complete  HRI or Home Care Consult Complete  Palliative Care Screening Not Applicable  Skilled Nursing Facility Not Applicable

## 2022-11-22 NOTE — Plan of Care (Signed)
  Problem: Activity: Goal: Ability to tolerate increased activity will improve Outcome: Adequate for Discharge   Problem: Respiratory: Goal: Ability to maintain a clear airway and adequate ventilation will improve Outcome: Adequate for Discharge   Problem: Role Relationship: Goal: Method of communication will improve Outcome: Adequate for Discharge   Problem: Education: Goal: Knowledge of General Education information will improve Description: Including pain rating scale, medication(s)/side effects and non-pharmacologic comfort measures Outcome: Adequate for Discharge   Problem: Health Behavior/Discharge Planning: Goal: Ability to manage health-related needs will improve Outcome: Adequate for Discharge   Problem: Clinical Measurements: Goal: Ability to maintain clinical measurements within normal limits will improve Outcome: Adequate for Discharge Goal: Will remain free from infection Outcome: Adequate for Discharge Goal: Diagnostic test results will improve Outcome: Adequate for Discharge Goal: Respiratory complications will improve Outcome: Adequate for Discharge Goal: Cardiovascular complication will be avoided Outcome: Adequate for Discharge   Problem: Activity: Goal: Risk for activity intolerance will decrease Outcome: Adequate for Discharge   Problem: Nutrition: Goal: Adequate nutrition will be maintained Outcome: Adequate for Discharge   Problem: Coping: Goal: Level of anxiety will decrease Outcome: Adequate for Discharge   Problem: Elimination: Goal: Will not experience complications related to bowel motility Outcome: Adequate for Discharge Goal: Will not experience complications related to urinary retention Outcome: Adequate for Discharge   Problem: Pain Managment: Goal: General experience of comfort will improve Outcome: Adequate for Discharge   Problem: Safety: Goal: Ability to remain free from injury will improve Outcome: Adequate for Discharge    Problem: Skin Integrity: Goal: Risk for impaired skin integrity will decrease Outcome: Adequate for Discharge   

## 2022-11-22 NOTE — Evaluation (Signed)
Occupational Therapy Evaluation Patient Details Name: Christina Byrd MRN: [id number] DOB: August 03, 1965 Today's Date: 11/22/2022   History of Present Illness Patient is a 57 year old female who presented to the hospital on 9/27 with LUE weakness. CT was unremarkable. 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 PMH: gout, GI bleed, CKD, DM   Clinical Impression   Patient is a 57 year old female who was admitted for above. Patient reported being independent at home with husband and sister helping her with ADLs as needed.  Patient is eager to transition home with family today. MD and family made aware. Patient's HR up to 137 bpm with functional mobility in hallway. Patient is supervision/CGA for ADLs at this time with patient reporting balance was off secondary to not wearing shoes like she normally does at home. Patient would continue to benefit from skilled OT services at this time while admitted to address noted deficits in order to improve overall safety and independence in ADLs.        If plan is discharge home, recommend the following: A little help with walking and/or transfers;Assistance with cooking/housework;Direct supervision/assist for medications management;Direct supervision/assist for financial management    Functional Status Assessment  Patient has had a recent decline in their functional status and demonstrates the ability to make significant improvements in function in a reasonable and predictable amount of time.  Equipment Recommendations  None recommended by OT       Precautions / Restrictions Precautions Precautions: Fall Restrictions Weight Bearing Restrictions: No      Mobility Bed Mobility Overal bed mobility: Modified Independent            Balance Overall balance assessment: Mild deficits observed, not formally tested               ADL either performed or assessed with  clinical judgement   ADL Overall ADL's : Needs assistance/impaired Eating/Feeding: Modified independent   Grooming: Set up   Upper Body Bathing: Set up   Lower Body Bathing: Contact guard assist Lower Body Bathing Details (indicate cue type and reason): can participate in figure four positioning of BLE for simulated task Upper Body Dressing : Set up;Sitting   Lower Body Dressing: Contact guard assist;Sitting/lateral leans;Sit to/from stand   Toilet Transfer: Electronics engineer Details (indicate cue type and reason): no AD unsteady at times during session. patient reproted she normally wears shoes at home Toileting- Architect and Hygiene: Contact guard assist;Sit to/from stand                Pertinent Vitals/Pain Pain Assessment Pain Assessment: Faces Faces Pain Scale: Hurts a little bit Pain Location: BUE from IVs Pain Descriptors / Indicators: Discomfort Pain Intervention(s): Limited activity within patient's tolerance     Extremity/Trunk Assessment Upper Extremity Assessment Upper Extremity Assessment: LUE deficits/detail;Difficult to assess due to impaired cognition;Generalized weakness;RUE deficits/detail RUE Deficits / Details: able to range ABduction to about 90 degrees reporting she can normally go higher. declined to participate in FF. LUE Deficits / Details: noted to have a large ammount of edema on forearm patient reported IV was there. declined for therapist to attempt to touch it to assess PROM. able to use during functional tasks.           Communication Communication Communication: No apparent difficulties   Cognition Arousal: Alert Behavior During Therapy: Flat affect Overall Cognitive Status: No family/caregiver present to determine baseline cognitive functioning  General Comments: patient was cooperative durig session pt eager to transition home. had a hard time with the date and noted to joke to  cover deficits. patient reported today was sunday sept [phone]                Home Living Family/patient expects to be discharged to:: Private residence Living Arrangements: Spouse/significant other Available Help at Discharge: Family;Available 24 hours/day Type of Home: House Home Access: Stairs to enter [employer] of Steps: 3-4 Entrance Stairs-Rails: Right;Left;Can reach both Home Layout: One level     Bathroom Shower/Tub: Tub/shower unit         Home Equipment: Agricultural consultant (2 wheels);Shower seat;Crutches   Additional Comments: states she can get RW easily      Prior Functioning/Environment Prior Level of Function : Independent/Modified Independent             Mobility Comments: no falls, I gait with no AD ADLs Comments: independent, 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)        OT Problem List: Decreased activity tolerance;Impaired balance (sitting and/or standing);Decreased coordination;Decreased safety awareness;Decreased knowledge of precautions;Decreased knowledge of use of DME or AE;Pain;Impaired UE functional use      OT Treatment/Interventions: Self-care/ADL training;Therapeutic exercise;DME and/or AE instruction;Therapeutic activities;Patient/family education;Balance training    OT Goals(Current goals can be found in the care plan section) Acute Rehab OT Goals Patient Stated Goal: to go home today OT Goal Formulation: With patient Time For Goal Achievement: 12/06/22 Potential to Achieve Goals: Fair  OT Frequency: Min 1X/week    Co-evaluation PT/OT/SLP Co-Evaluation/Treatment: Yes Reason for Co-Treatment: For patient/therapist safety;Necessary to address cognition/behavior during functional activity PT goals addressed during session: Mobility/safety with mobility OT goals addressed during session: ADL's and self-care      AM-PAC OT "6 Clicks" Daily Activity     Outcome Measure Help from another  person eating meals?: None Help from another person taking care of personal grooming?: A Little Help from another person toileting, which includes using toliet, bedpan, or urinal?: A Little Help from another person bathing (including washing, rinsing, drying)?: A Little Help from another person to put on and taking off regular upper body clothing?: A Little Help from another person to put on and taking off regular lower body clothing?: A Little 6 Click Score: 19   End of Session Equipment Utilized During Treatment: Gait belt Nurse Communication: Mobility status  Activity Tolerance: Patient tolerated treatment well Patient left: in chair;with call bell/phone within reach;with chair alarm set  OT Visit Diagnosis: Unsteadiness on feet (R26.81);Other abnormalities of gait and mobility (R26.89)                Time: 1478-2956 OT Time Calculation (min): 20 min Charges:  OT General Charges $OT Visit: 1 Visit OT Evaluation $OT Eval Low Complexity: 1 Low  Dezirea Mccollister OTR/L, MS Acute Rehabilitation Department Office# [phone]   Selinda Flavin 11/22/2022, 12:08 PM

## 2022-11-23 ENCOUNTER — Telehealth (HOSPITAL_COMMUNITY): Payer: Self-pay | Admitting: *Deleted

## 2022-11-23 ENCOUNTER — Ambulatory Visit: Payer: No Typology Code available for payment source | Admitting: Neurology

## 2022-11-23 NOTE — Telephone Encounter (Signed)
Attempted to contact [employer] to reschedule OP MBS. Phone not working, unable to leave VM. RKEEL

## 2022-11-24 ENCOUNTER — Telehealth: Payer: Self-pay

## 2022-11-24 NOTE — Transitions of Care (Post Inpatient/ED Visit) (Signed)
11/24/2022  Name: Christina Byrd MRN: [id number] DOB: 1965-12-25  Today's TOC FU Call Status:    Attempted to reach the patient regarding the most recent Inpatient/ED visit.  Follow Up Plan: Additional outreach attempts will be made to reach the patient to complete the Transitions of Care (Post Inpatient/ED visit) call.   Jodelle Gross RN, BSN, CCM RN Care Manager  Transitions of Care  [facility] - [facility]  [phone]

## 2022-11-25 ENCOUNTER — Telehealth: Payer: Self-pay

## 2022-11-25 NOTE — Transitions of Care (Post Inpatient/ED Visit) (Signed)
11/25/2022  Name: Christina Byrd MRN: [id number] DOB: 03/23/1965  Today's TOC FU Call Status: Today's TOC FU Call Status:: Unsuccessful Call (2nd Attempt) Unsuccessful Call (2nd Attempt) Date: 11/25/22  Attempted to reach the patient regarding the most recent Inpatient/ED visit.  Follow Up Plan: Additional outreach attempts will be made to reach the patient to complete the Transitions of Care (Post Inpatient/ED visit) call.   Jodelle Gross RN, BSN, CCM RN Care Manager  Transitions of Care  [facility] - [facility]  [phone]

## 2022-11-26 ENCOUNTER — Telehealth: Payer: Self-pay

## 2022-11-26 NOTE — Transitions of Care (Post Inpatient/ED Visit) (Signed)
11/26/2022  Name: Christina Byrd MRN: [id number] DOB: Apr 02, 1965  Today's TOC FU Call Status: Today's TOC FU Call Status:: Unsuccessful Call (3rd Attempt) Unsuccessful Call (3rd Attempt) Date: 11/26/22  Attempted to reach the patient regarding the most recent Inpatient/ED visit.  Follow Up Plan: No further outreach attempts will be made at this time. We have been unable to contact the patient.  Jodelle Gross RN, BSN, CCM RN Care Manager  Transitions of Care  [facility] - [facility]  [phone]

## 2022-11-30 ENCOUNTER — Telehealth (HOSPITAL_COMMUNITY): Payer: Self-pay | Admitting: *Deleted

## 2022-11-30 NOTE — Telephone Encounter (Signed)
Attempted to contact patient to reschedule OP MBS. Unable to leave VM. RKEEL

## 2022-12-01 ENCOUNTER — Encounter: Payer: BLUE CROSS/BLUE SHIELD | Admitting: Student

## 2022-12-07 ENCOUNTER — Telehealth (HOSPITAL_COMMUNITY): Payer: Self-pay | Admitting: *Deleted

## 2022-12-07 NOTE — Telephone Encounter (Signed)
Attempted to contact patient to schedule OP MBS. Unable to leave VM. Contacted patients significant other and request patient give [place] a call to schedule appointment. RKEEL

## 2022-12-14 ENCOUNTER — Ambulatory Visit (HOSPITAL_COMMUNITY)
Admission: RE | Admit: 2022-12-14 | Discharge: 2022-12-14 | Disposition: A | Discharge status: Home / Self Care | Payer: BLUE CROSS/BLUE SHIELD | Source: Ambulatory Visit | Attending: Internal Medicine | Admitting: Internal Medicine

## 2022-12-14 ENCOUNTER — Ambulatory Visit (HOSPITAL_COMMUNITY): Payer: BLUE CROSS/BLUE SHIELD

## 2022-12-14 ENCOUNTER — Encounter (HOSPITAL_COMMUNITY): Payer: BLUE CROSS/BLUE SHIELD

## 2022-12-14 ENCOUNTER — Telehealth (HOSPITAL_COMMUNITY): Payer: Self-pay | Admitting: Student

## 2022-12-14 DIAGNOSIS — R1314 Dysphagia, pharyngoesophageal phase: Secondary | ICD-10-CM

## 2022-12-14 NOTE — Telephone Encounter (Signed)
Patient's husband called to cancel OP MBS due to patient not feeling well. Instructed him that the patient will need new order from physician due to scheduling difficulties and a no show. Will close order. AHARRIS

## 2022-12-23 ENCOUNTER — Inpatient Hospital Stay (HOSPITAL_COMMUNITY)
Admission: AD | Admit: 2022-12-23 | Discharge: 2022-12-30 | DRG: 441 | Disposition: A | Discharge status: Home / Self Care | Payer: BLUE CROSS/BLUE SHIELD | Source: Ambulatory Visit | Attending: Internal Medicine | Admitting: Internal Medicine

## 2022-12-23 ENCOUNTER — Encounter: Payer: Self-pay | Admitting: Internal Medicine

## 2022-12-23 ENCOUNTER — Ambulatory Visit: Payer: BLUE CROSS/BLUE SHIELD | Admitting: Internal Medicine

## 2022-12-23 ENCOUNTER — Encounter (HOSPITAL_COMMUNITY): Payer: Self-pay

## 2022-12-23 ENCOUNTER — Observation Stay (HOSPITAL_COMMUNITY): Payer: BLUE CROSS/BLUE SHIELD

## 2022-12-23 VITALS — BP 70/47 | HR 115 | Temp 98.7°F | Ht 62.0 in | Wt 115.3 lb

## 2022-12-23 DIAGNOSIS — Z888 Allergy status to other drugs, medicaments and biological substances status: Secondary | ICD-10-CM

## 2022-12-23 DIAGNOSIS — R1310 Dysphagia, unspecified: Secondary | ICD-10-CM | POA: Diagnosis not present

## 2022-12-23 DIAGNOSIS — Z5320 Procedure and treatment not carried out because of patient's decision for unspecified reasons: Secondary | ICD-10-CM | POA: Diagnosis present

## 2022-12-23 DIAGNOSIS — E876 Hypokalemia: Secondary | ICD-10-CM | POA: Diagnosis present

## 2022-12-23 DIAGNOSIS — K760 Fatty (change of) liver, not elsewhere classified: Secondary | ICD-10-CM | POA: Diagnosis present

## 2022-12-23 DIAGNOSIS — Z885 Allergy status to narcotic agent status: Secondary | ICD-10-CM

## 2022-12-23 DIAGNOSIS — D631 Anemia in chronic kidney disease: Secondary | ICD-10-CM | POA: Diagnosis present

## 2022-12-23 DIAGNOSIS — D62 Acute posthemorrhagic anemia: Secondary | ICD-10-CM | POA: Diagnosis present

## 2022-12-23 DIAGNOSIS — K7200 Acute and subacute hepatic failure without coma: Principal | ICD-10-CM

## 2022-12-23 DIAGNOSIS — R197 Diarrhea, unspecified: Secondary | ICD-10-CM

## 2022-12-23 DIAGNOSIS — E878 Other disorders of electrolyte and fluid balance, not elsewhere classified: Secondary | ICD-10-CM | POA: Insufficient documentation

## 2022-12-23 DIAGNOSIS — E860 Dehydration: Secondary | ICD-10-CM | POA: Diagnosis present

## 2022-12-23 DIAGNOSIS — R1314 Dysphagia, pharyngoesophageal phase: Secondary | ICD-10-CM | POA: Diagnosis present

## 2022-12-23 DIAGNOSIS — R9431 Abnormal electrocardiogram [ECG] [EKG]: Secondary | ICD-10-CM | POA: Diagnosis present

## 2022-12-23 DIAGNOSIS — K7460 Unspecified cirrhosis of liver: Secondary | ICD-10-CM | POA: Insufficient documentation

## 2022-12-23 DIAGNOSIS — Z7984 Long term (current) use of oral hypoglycemic drugs: Secondary | ICD-10-CM

## 2022-12-23 DIAGNOSIS — R569 Unspecified convulsions: Secondary | ICD-10-CM | POA: Diagnosis not present

## 2022-12-23 DIAGNOSIS — R112 Nausea with vomiting, unspecified: Secondary | ICD-10-CM

## 2022-12-23 DIAGNOSIS — Z881 Allergy status to other antibiotic agents status: Secondary | ICD-10-CM

## 2022-12-23 DIAGNOSIS — E43 Unspecified severe protein-calorie malnutrition: Secondary | ICD-10-CM | POA: Insufficient documentation

## 2022-12-23 DIAGNOSIS — R195 Other fecal abnormalities: Secondary | ICD-10-CM

## 2022-12-23 DIAGNOSIS — D500 Iron deficiency anemia secondary to blood loss (chronic): Secondary | ICD-10-CM | POA: Diagnosis present

## 2022-12-23 DIAGNOSIS — Y900 Blood alcohol level of less than 20 mg/100 ml: Secondary | ICD-10-CM | POA: Diagnosis present

## 2022-12-23 DIAGNOSIS — M109 Gout, unspecified: Secondary | ICD-10-CM | POA: Diagnosis present

## 2022-12-23 DIAGNOSIS — E782 Mixed hyperlipidemia: Secondary | ICD-10-CM | POA: Diagnosis present

## 2022-12-23 DIAGNOSIS — K529 Noninfective gastroenteritis and colitis, unspecified: Secondary | ICD-10-CM

## 2022-12-23 DIAGNOSIS — Z789 Other specified health status: Secondary | ICD-10-CM

## 2022-12-23 DIAGNOSIS — Z6822 Body mass index (BMI) 22.0-22.9, adult: Secondary | ICD-10-CM

## 2022-12-23 DIAGNOSIS — R578 Other shock: Secondary | ICD-10-CM | POA: Diagnosis not present

## 2022-12-23 DIAGNOSIS — F1090 Alcohol use, unspecified, uncomplicated: Secondary | ICD-10-CM

## 2022-12-23 DIAGNOSIS — E46 Unspecified protein-calorie malnutrition: Secondary | ICD-10-CM | POA: Insufficient documentation

## 2022-12-23 DIAGNOSIS — R58 Hemorrhage, not elsewhere classified: Secondary | ICD-10-CM | POA: Diagnosis present

## 2022-12-23 DIAGNOSIS — G40909 Epilepsy, unspecified, not intractable, without status epilepticus: Secondary | ICD-10-CM | POA: Diagnosis present

## 2022-12-23 DIAGNOSIS — Z883 Allergy status to other anti-infective agents status: Secondary | ICD-10-CM

## 2022-12-23 DIAGNOSIS — Z8249 Family history of ischemic heart disease and other diseases of the circulatory system: Secondary | ICD-10-CM

## 2022-12-23 DIAGNOSIS — N179 Acute kidney failure, unspecified: Secondary | ICD-10-CM | POA: Diagnosis present

## 2022-12-23 DIAGNOSIS — Z87891 Personal history of nicotine dependence: Secondary | ICD-10-CM

## 2022-12-23 DIAGNOSIS — K626 Ulcer of anus and rectum: Secondary | ICD-10-CM | POA: Diagnosis present

## 2022-12-23 DIAGNOSIS — Z79899 Other long term (current) drug therapy: Secondary | ICD-10-CM

## 2022-12-23 DIAGNOSIS — R946 Abnormal results of thyroid function studies: Secondary | ICD-10-CM | POA: Diagnosis present

## 2022-12-23 DIAGNOSIS — R0982 Postnasal drip: Secondary | ICD-10-CM | POA: Diagnosis not present

## 2022-12-23 DIAGNOSIS — F1490 Cocaine use, unspecified, uncomplicated: Secondary | ICD-10-CM | POA: Diagnosis present

## 2022-12-23 DIAGNOSIS — R7989 Other specified abnormal findings of blood chemistry: Secondary | ICD-10-CM

## 2022-12-23 DIAGNOSIS — E440 Moderate protein-calorie malnutrition: Secondary | ICD-10-CM | POA: Diagnosis present

## 2022-12-23 DIAGNOSIS — I129 Hypertensive chronic kidney disease with stage 1 through stage 4 chronic kidney disease, or unspecified chronic kidney disease: Secondary | ICD-10-CM | POA: Diagnosis present

## 2022-12-23 DIAGNOSIS — E861 Hypovolemia: Secondary | ICD-10-CM | POA: Diagnosis not present

## 2022-12-23 DIAGNOSIS — N1832 Chronic kidney disease, stage 3b: Secondary | ICD-10-CM | POA: Diagnosis present

## 2022-12-23 DIAGNOSIS — E038 Other specified hypothyroidism: Secondary | ICD-10-CM | POA: Diagnosis not present

## 2022-12-23 DIAGNOSIS — K219 Gastro-esophageal reflux disease without esophagitis: Secondary | ICD-10-CM | POA: Diagnosis present

## 2022-12-23 HISTORY — DX: Nausea with vomiting, unspecified: R11.2

## 2022-12-23 LAB — CBC WITH DIFFERENTIAL/PLATELET
Abs Immature Granulocytes: 0.07 10*3/uL (ref 0.00–0.07)
Basophils Absolute: 0 10*3/uL (ref 0.0–0.1)
Basophils Relative: 0 %
Eosinophils Absolute: 0.1 10*3/uL (ref 0.0–0.5)
Eosinophils Relative: 0 %
HCT: 18.9 % — ABNORMAL LOW (ref 36.0–46.0)
Hemoglobin: 6 g/dL — CL (ref 12.0–15.0)
Immature Granulocytes: 1 %
Lymphocytes Relative: 17 %
Lymphs Abs: 2 10*3/uL (ref 0.7–4.0)
MCH: 32.3 pg (ref 26.0–34.0)
MCHC: 31.7 g/dL (ref 30.0–36.0)
MCV: 101.6 fL — ABNORMAL HIGH (ref 80.0–100.0)
Monocytes Absolute: 1.4 10*3/uL — ABNORMAL HIGH (ref 0.1–1.0)
Monocytes Relative: 12 %
Neutro Abs: 8.2 10*3/uL — ABNORMAL HIGH (ref 1.7–7.7)
Neutrophils Relative %: 70 %
Platelets: 175 10*3/uL (ref 150–400)
RBC: 1.86 MIL/uL — ABNORMAL LOW (ref 3.87–5.11)
RDW: 17.2 % — ABNORMAL HIGH (ref 11.5–15.5)
WBC: 11.7 10*3/uL — ABNORMAL HIGH (ref 4.0–10.5)
nRBC: 0.4 % — ABNORMAL HIGH (ref 0.0–0.2)

## 2022-12-23 LAB — IRON AND TIBC
Iron: 132 ug/dL (ref 28–170)
Saturation Ratios: 86 % — ABNORMAL HIGH (ref 10.4–31.8)
TIBC: 154 ug/dL — ABNORMAL LOW (ref 250–450)
UIBC: 22 ug/dL

## 2022-12-23 LAB — FERRITIN: Ferritin: 895 ng/mL — ABNORMAL HIGH (ref 11–307)

## 2022-12-23 LAB — TSH: TSH: 6.671 u[IU]/mL — ABNORMAL HIGH (ref 0.350–4.500)

## 2022-12-23 LAB — MAGNESIUM: Magnesium: <0.5 mg/dL — CL (ref 1.7–2.4)

## 2022-12-23 LAB — LACTIC ACID, PLASMA: Lactic Acid, Venous: 1.5 mmol/L (ref 0.5–1.9)

## 2022-12-23 LAB — PHOSPHORUS: Phosphorus: 1.5 mg/dL — ABNORMAL LOW (ref 2.5–4.6)

## 2022-12-23 MED ORDER — THIAMINE HCL 100 MG/ML IJ SOLN
100.0000 mg | Freq: Every day | INTRAMUSCULAR | Status: DC
  Administered 2022-12-24 – 2022-12-28 (×2): 100 mg via INTRAVENOUS
  Filled 2022-12-23 (×3): qty 2

## 2022-12-23 MED ORDER — HEPARIN SODIUM (PORCINE) 5000 UNIT/ML IJ SOLN: 5000.0000 [IU] | Freq: Three times a day (TID) | INTRAMUSCULAR | Status: DC

## 2022-12-23 MED ORDER — GERHARDT'S BUTT CREAM
TOPICAL_CREAM | Freq: Three times a day (TID) | CUTANEOUS | Status: DC
  Filled 2022-12-23 (×2): qty 1

## 2022-12-23 MED ORDER — THIAMINE MONONITRATE 100 MG PO TABS
100.0000 mg | ORAL_TABLET | Freq: Every day | ORAL | Status: DC
  Administered 2022-12-23 – 2022-12-30 (×6): 100 mg via ORAL
  Filled 2022-12-23 (×7): qty 1

## 2022-12-23 MED ORDER — FOLIC ACID 1 MG PO TABS
1.0000 mg | ORAL_TABLET | Freq: Every day | ORAL | Status: DC
  Administered 2022-12-23 – 2022-12-30 (×7): 1 mg via ORAL
  Filled 2022-12-23 (×9): qty 1

## 2022-12-23 MED ORDER — LORAZEPAM 1 MG PO TABS
1.0000 mg | ORAL_TABLET | ORAL | Status: DC | PRN
  Administered 2022-12-23 – 2022-12-25 (×3): 1 mg via ORAL
  Filled 2022-12-23 (×3): qty 1

## 2022-12-23 MED ORDER — LACTATED RINGERS IV BOLUS
1000.0000 mL | Freq: Once | INTRAVENOUS | Status: AC
  Administered 2022-12-23: 1000 mL via INTRAVENOUS

## 2022-12-23 MED ORDER — ACETAMINOPHEN 325 MG PO TABS
650.0000 mg | ORAL_TABLET | Freq: Four times a day (QID) | ORAL | Status: DC | PRN
  Administered 2022-12-23 – 2022-12-27 (×5): 650 mg via ORAL
  Filled 2022-12-23 (×5): qty 2

## 2022-12-23 MED ORDER — ADULT MULTIVITAMIN W/MINERALS CH
1.0000 | ORAL_TABLET | Freq: Every day | ORAL | Status: DC
Start: 2022-12-23 — End: 2022-12-30
  Administered 2022-12-23 – 2022-12-30 (×7): 1 via ORAL
  Filled 2022-12-23 (×9): qty 1

## 2022-12-23 MED ORDER — ACETAMINOPHEN 650 MG RE SUPP: 650.0000 mg | Freq: Four times a day (QID) | RECTAL | Status: DC | PRN

## 2022-12-23 MED ORDER — SODIUM CHLORIDE 0.9% IV SOLUTION: Freq: Once | INTRAVENOUS | Status: AC

## 2022-12-23 MED ORDER — ATORVASTATIN CALCIUM 10 MG PO TABS
20.0000 mg | ORAL_TABLET | Freq: Every day | ORAL | Status: DC
  Administered 2022-12-25 – 2022-12-27 (×3): 20 mg via ORAL
  Filled 2022-12-23 (×5): qty 2

## 2022-12-23 MED ORDER — LEVETIRACETAM 750 MG PO TABS
750.0000 mg | ORAL_TABLET | Freq: Two times a day (BID) | ORAL | Status: DC
  Administered 2022-12-24 – 2022-12-25 (×2): 750 mg via ORAL
  Filled 2022-12-23 (×4): qty 1

## 2022-12-23 MED ORDER — LEVETIRACETAM 750 MG PO TABS
750.0000 mg | ORAL_TABLET | Freq: Two times a day (BID) | ORAL | Status: DC
  Filled 2022-12-23: qty 1

## 2022-12-23 MED ORDER — PANTOPRAZOLE SODIUM 40 MG PO TBEC
40.0000 mg | DELAYED_RELEASE_TABLET | Freq: Every day | ORAL | Status: DC
  Administered 2022-12-23: 40 mg via ORAL
  Filled 2022-12-23 (×2): qty 1

## 2022-12-23 MED ORDER — MAGNESIUM SULFATE 4 GM/100ML IV SOLN
4.0000 g | Freq: Once | INTRAVENOUS | Status: AC
  Administered 2022-12-23: 4 g via INTRAVENOUS
  Filled 2022-12-23: qty 100

## 2022-12-23 MED ORDER — SENNOSIDES-DOCUSATE SODIUM 8.6-50 MG PO TABS: 1.0000 | ORAL_TABLET | Freq: Every evening | ORAL | Status: DC | PRN

## 2022-12-23 MED ORDER — SODIUM CHLORIDE 0.9 % IV SOLN
750.0000 mg | Freq: Two times a day (BID) | INTRAVENOUS | Status: DC
  Administered 2022-12-23 – 2022-12-24 (×2): 750 mg via INTRAVENOUS
  Filled 2022-12-23 (×6): qty 7.5

## 2022-12-23 NOTE — Progress Notes (Signed)
Report called to [facility].  Patient transported to [address] [employer] [facility] via wheelchair.  Family in attendance with patient.  Alert and oriented.

## 2022-12-23 NOTE — Progress Notes (Signed)
Patient refused lab draw provider notified

## 2022-12-23 NOTE — Progress Notes (Signed)
    Subjective:  CC: Hospital follow up  HPI:  [name] is a 57 y.o. female with a past medical history stated below and presents today for hospital follow up. 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   Past Medical History:  Diagnosis Date   GASTROESOPHAGEAL REFLUX, NO ESOPHAGITIS 04/21/2006   Qualifier: Diagnosis of  By: Levada Schilling     GERD (gastroesophageal reflux disease) Dx 1995   HTN (hypertension)    Seizures (HCC)     No current facility-administered medications on file prior to visit.   Current Outpatient Medications on File Prior to Visit  Medication Sig Dispense Refill   allopurinol (ZYLOPRIM) 100 MG tablet Take 100 mg by mouth daily. Pt takes 1 tablet daily.     atorvastatin (LIPITOR) 20 MG tablet Take 1 tablet (20 mg total) by mouth daily. Pt takes 1 tablet daily. 30 tablet 2   folic acid (FOLVITE) 1 MG tablet Take 1 tablet (1 mg total) by mouth daily. 30 tablet 2   levETIRAcetam (KEPPRA) 750 MG tablet Take 1 tablet (750 mg total) by mouth 2 (two) times daily. 60 tablet 2   ondansetron (ZOFRAN-ODT) 4 MG disintegrating tablet Take 1 tablet (4 mg total) by mouth every 8 (eight) hours as needed. 20 tablet 0   pantoprazole (PROTONIX) 40 MG tablet Take 1 tablet (40 mg total) by mouth daily. 90 tablet 3   [DISCONTINUED] metFORMIN (GLUCOPHAGE) 500 MG tablet Take 0.5 tablets (250 mg total) by mouth daily with breakfast. 45 tablet 6    Review of Systems: ROS negative except for as is noted on the assessment and plan.  Objective:   Vitals:   12/23/22 1411  BP: (!) 70/47  Pulse: (!) 115  Temp: 98.7 F (37.1 C)  TempSrc: Oral  SpO2: 100%  Weight: 115 lb 4.8 oz (52.3 kg)  Height: 5\' 2"  (1.575 m)    Physical Exam: Constitutional: ill-appearing HENT: glossitis, dry oral mucosa Eyes: conjunctiva non-erythematous Neck: supple Cardiovascular:  tachycardic Pulmonary/Chest: normal work of breathing on room air, lungs clear to auscultation bilaterally Abdominal: soft, non-tender, non-distended MSK: normal bulk and tone Neurological: alert & oriented x 3, 5/5 strength in bilateral upper and lower extremities, normal gait Skin: warm and dry  Assessment & Plan:   Dysphagia Patient reports she has been unable to eat or drink since she was discharged home after recent hospital stay. She states she immediately throws up anything she attempts to eat or drink. This is a recurrent issue for her with multiple ED visits for symptomatic hypotension related to poor oral intake. She was recently referred for outpatient GI workout and swallow study, though wasn't able to complete these due to recent hospital admission for seizures. Today she feels weak and ill with BP 70/47. She is able to drink a small amount of water while in the room. Patient will be admitted to IMTS for IV fluid replacement and GI workup of her dysphagia.     Patient seen with Dr. Alan Ripper MD [facility] Internal Medicine  PGY-1 Pager: [phone] Date 12/23/2022  Time 7:18 PM

## 2022-12-23 NOTE — Progress Notes (Signed)
Patient was taken from Internal Medicine  via wheel chair at 1545 PM. Alert and Oriented * 4. On RA. Will continue to monitor

## 2022-12-23 NOTE — Hospital Course (Addendum)
Keppra 750 mg BID 9/30pantoprazole 40 mg daily Colchicine 3x0.6mg allopurinol 200 x2 mg  Fenofibrate 1 tablet day Atorvastatin 20 mg dailyzofran folic acid  Diclofenac Keppra 500 mg BID  12/24/2022 Opt sleeping in bed bed , hard to arouse. BP in room is 85/69. Denies any pain. Knows her name and knows she is at [facility].

## 2022-12-23 NOTE — Progress Notes (Signed)
Patient was red mews for BP and HR. BP 76/63 HR 115/min Made the charge nurse and provider aware. Will continue to monitor

## 2022-12-23 NOTE — Progress Notes (Signed)
Report called to 6E for bed 8 for patient transfer

## 2022-12-23 NOTE — Assessment & Plan Note (Signed)
Patient reports she has been unable to eat or drink since she was discharged home after recent hospital stay. She states she immediately throws up anything she attempts to eat or drink. This is a recurrent issue for her with multiple ED visits for symptomatic hypotension related to poor oral intake. She was recently referred for outpatient GI workout and swallow study, though wasn't able to complete these due to recent hospital admission for seizures. Today she feels weak and ill with BP 70/47. She is able to drink a small amount of water while in the room. Patient will be admitted to IMTS for IV fluid replacement and GI workup of her dysphagia.

## 2022-12-23 NOTE — H&P (Addendum)
Date: 12/23/2022               Patient Name:  Christina Byrd MRN: [id number]  DOB: 05-09-1965 Age / Sex: 57 y.o., female   PCP: [name], DO         Medical Service: Internal Medicine Teaching Service         Attending Physician: Dr. Ginnie Smart, MD      First Contact: Dr. Kathleen Lime, MD Pager [phone]    Second Contact: Dr. Morene Crocker, MD Pager [phone]         After Hours (After 5p/  First Contact Pager: [phone]  weekends / holidays): Second Contact Pager: [phone]   SUBJECTIVE   Chief Complaint: Hypotension    History of Present Illness: This is a 57 year old female with a history of alcohol use disorder, GERD,CKD 3B, Generalized-tonic  seizures, GOUT, GI bleed with angioectasias 05/2022 , anemia ,presents as a direct admit from the internal medicine clinic at [facility] due to concerns for hypotension of 70/47.Patient also reported associated nausea and vomiting, dysphagia and dizzy spells. On presentation, patient had a blood pressure 76/63 and a heart rate of 115.Patient also reported associated watery diarrhea that has been ongoing for the past 2 weeks.Patient has not been able to eat or drink anything for the past two weeks. York Spaniel it hurt more to eat than to drink.  Patient reported she was admitted at [facility] about 2 weeks ago and fell from the bed and injured her right shoulder.  She reported her symptoms of dysphagia, hypotension and diarrhea started right after she got discharged from that hospitalization.Patient was also admitted for hypotension on 10/01/2022  Of note, Pt had an EGD on 06/03/2022,that showed - Normal  portions of the nasopharynx, oropharynx and larynx  and esophagus. There was clotted blood in the gastric fundus and a single bleeding angioectasia in the stomach that was treated with argon plasma coagulation.   ED Course: Direct admission from Clinic  Past Medical History. Per Chat review  Patient Active Problem List    Diagnosis Date Noted   Nausea & vomiting 12/23/2022   Hypocalcemia 11/19/2022   AKI (acute kidney injury) (HCC) 11/19/2022   Hypoalbuminemia due to protein-calorie malnutrition (HCC) 11/19/2022   Mixed hyperlipidemia 11/19/2022   Elevated lipase 11/19/2022   Hypotension 10/21/2022   Alcohol related seizure (HCC) 09/27/2022   Osteoarthritis 09/02/2022   History of noncompliance with medical treatment 07/27/2022   Hepatic steatosis 07/27/2022   Hypokalemia 07/27/2022   High anion gap metabolic acidosis 07/27/2022   Coffee ground emesis 07/26/2022   Acute pain of right knee 07/08/2022   Transaminitis 07/08/2022   Iron deficiency anemia 07/08/2022   Alcohol withdrawal (HCC) 07/07/2022   GI bleed 06/03/2022   Alcohol abuse 06/03/2022   Duodenal ulcer hemorrhage 06/03/2022   AVM (arteriovenous malformation) of stomach, 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   Acute blood loss anemia 06/03/2022   Dysphagia 03/28/2020   Cervicalgia 03/28/2020   Hot flashes 01/30/2020   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   Hypomagnesemia 03/14/2018   Depression 07/08/2016   Seizure disorder (HCC) 07/08/2016   Cocaine use    Hot flash, menopausal 06/22/2016   Intertrigo 06/22/2016   Gout of left ankle 10/09/2015   Left hip pain 09/02/2015   Alcohol use disorder 09/02/2015   Colonoscopy refused 09/02/2015   Hyperuricemia 05/27/2015   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   Left shoulder pain 04/25/2015   Pain  and swelling of left ankle 12/02/2014   Tendinitis of right hip flexor 07/23/2014   Allergic rhinitis 05/13/2014   GERD without esophagitis 04/01/2014   Tendonitis, Achilles, right 04/01/2014   Osteoarthritis of left knee 12/11/2013   Ex-smoker 11/09/2008   OBESITY 08/12/2006   HYPERTENSION, BENIGN ESSENTIAL 08/12/2006      Meds:  No outpatient medications have been marked as taking for the 12/23/22 encounter [facility] Encounter).  Keppra 750 mg  BID 9/30pantoprazole 40 mg daily Colchicine 3x0.6mg allopurinol 200 x2 mg  Fenofibrate 1 tablet day Atorvastatin 20 mg dailyzofran folic acid  Diclofenac Keppra 500 mg BID   Past Surgical History:  Procedure Laterality Date   BIOPSY  06/03/2022   Procedure: BIOPSY;  Surgeon: Jenel Lucks, MD;  Location: [facility] ENDOSCOPY;  Service: Gastroenterology;;   ESOPHAGOGASTRODUODENOSCOPY (EGD) WITH PROPOFOL N/A 06/03/2022   Procedure: ESOPHAGOGASTRODUODENOSCOPY (EGD) WITH PROPOFOL;  Surgeon: Jenel Lucks, MD;  Location: [facility] ENDOSCOPY;  Service: Gastroenterology;  Laterality: N/A;   HOT HEMOSTASIS N/A 06/03/2022   Procedure: HOT HEMOSTASIS (ARGON PLASMA COAGULATION/BICAP);  Surgeon: Jenel Lucks, MD;  Location: [facility] ENDOSCOPY;  Service: Gastroenterology;  Laterality: N/A;    Social:  Lives With: Husband at home Occupation: Works at [employer].  Support: Self Level of Function: Patient is unable to drive due to history of seizures but is able to do all of her other ADLs/IADLs PCP: [name], DO Substances: Drinks about 2 beers and 2 shots of rum every day.  Last drink was about 2 to 3 days ago Production manager: Husband Code Status: Full code   Family History:  No family history reported at this time  Allergies: Allergies as of 12/23/2022 - Unable to Assess 12/23/2022  Allergen Reaction Noted   Losartan potassium  12/20/2012   Levaquin [levofloxacin in d5w] Rash 02/06/2013   Ace inhibitors Cough 01/30/2008   Codeine Nausea Only 05/26/2015   Cefepime Rash 09/05/2012   Chlorhexidine Itching and Rash 07/09/2022   Vancomycin Rash 09/05/2012    Review of Systems: A complete ROS was negative except as per HPI.   OBJECTIVE:   Physical Exam: Last menstrual period 08/23/2011.  Constitutional: Mildly ill-appearing woman sitting in bed in no acute distress , answering questions appropriately  HENT: I did not appreciate any signs of trauma to the head.   Tongue is dry Eyes: conjunctiva non-erythematous Cardiovascular: Tachycardic  Pulmonary/Chest: Lungs are clear bilaterally MSK: Unable to move right shoulder  Abdominal: soft, non-tender, non-distended Neurological:  Purposeful trauma noted on arm extension bilaterally Skin: warm and dry Psych: Normal mood and affect, speech is rushed but goal oriented. GU: Presence of irritation to the skin at the intergluteal cleft  Labs: CBC    Component Value Date/Time   WBC 8.2 11/20/2022 0058   RBC 2.47 (L) 11/20/2022 0058   HGB 7.8 (L) 11/20/2022 0058   HGB 12.4 04/27/2022 1125   HCT 23.0 (L) 11/20/2022 0058   HCT 37.6 04/27/2022 1125   PLT 239 11/20/2022 0058   PLT 210 04/27/2022 1125   MCV 93.1 11/20/2022 0058   MCV 97 04/27/2022 1125   MCH 31.6 11/20/2022 0058   MCHC 33.9 11/20/2022 0058   RDW 15.9 (H) 11/20/2022 0058   RDW 17.0 (H) 04/27/2022 1125   LYMPHSABS 2.8 11/19/2022 1637   LYMPHSABS 1.5 04/27/2022 1125   MONOABS 0.3 11/19/2022 1637   EOSABS 0.0 11/19/2022 1637   EOSABS 0.0 04/27/2022 1125   BASOSABS 0.1 11/19/2022 1637   BASOSABS 0.0 04/27/2022 1125  CMP     Component Value Date/Time   NA 142 11/22/2022 0549   NA 144 04/27/2022 1125   K 3.9 11/22/2022 0549   CL 111 11/22/2022 0549   CO2 24 11/22/2022 0549   GLUCOSE 128 (H) 11/22/2022 0549   BUN 5 (L) 11/22/2022 0549   BUN 7 04/27/2022 1125   CREATININE 0.92 11/22/2022 0549   CREATININE 1.00 03/21/2014 1556   CALCIUM 7.0 (L) 11/22/2022 0549   PROT 5.2 (L) 11/22/2022 0549   PROT 7.1 04/27/2022 1125   ALBUMIN 2.1 (L) 11/22/2022 0549   ALBUMIN 3.5 (L) 04/27/2022 1125   AST 57 (H) 11/22/2022 0549   ALT 19 11/22/2022 0549   ALKPHOS 77 11/22/2022 0549   BILITOT 0.6 11/22/2022 0549   BILITOT 0.6 04/27/2022 1125   GFRNONAA >60 11/22/2022 0549   GFRNONAA >89 12/11/2013 1214   GFRAA 61 01/02/2020 1100   GFRAA >89 12/11/2013 1214    Imaging:  EKG: Pending   ASSESSMENT & PLAN:   Assessment & Plan by  Problem: Principal Problem:   Nausea & vomiting   Christina Byrd is a 57 y.o. person living with a history of GERD,CKD 3B, Generalized tonic  seizures, GOUT, GI bleed with angioectasias 05/2022 , Anemia, who presented with blood pressure of 70/74 and admitted for Hypotension.  #Hypotension #Diarrhea  #Nausea and Vomiting  Patient presented with a hypotension of 70/47. Differentials for this presentation includes vasodilatory causes such as cardiac tamponade ,sepsis, neurogenic shock vs hypovolemia causes such as diarrhea, vomiting ,inadequate fluid intake,diuresis, pancreatitis or burns, vs medication relates causes such as beta blockers and calcium channel blockers vs metabolic causes such as adrenal insufficiency or diabetes or Thyroid. Patient reports progressive non bloody, non bilious  vomiting and watery diarrhea for the past two weeks that I suspect could cause her hypotension but other etiologies cannot be completely ruled out at this time. She is afebrile and doesn't have have any sign of infection. 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 She is not on any antihypertensives at this time. Further work up is required. I will get some labs, monitor vitals, further imaging and further assess.  - TSH - Am Cortisol - Check Phosphorus and  Magnesium - CMP - CBC - Urine Drug Screen  - Lactic Acid  - ECHO to assess Heart Function   #Dysphagia #History of GERD Patient reports progressing pain with swallowing the past 2 weeks.  York Spaniel it is so hard to keep anything down including her medications.  She denies any weight loss or trouble drinking fluids.  Patient reports she feels like choking whenever she tries to eat. Differential in this otherwise 57 year old woman with a history of GERD presenting with dysphagia with solids foods only includes ring web eosinophilic esophagitis, peptic stricture or esophageal cancer.  Patient  reports she has not smoked cigarettes for over 10 years, reports she has lost about 38 pounds since May 2024.  Patient had an EGD on 06/03/2022,that showed normal  portions of the nasopharynx, oropharynx and larynx  and esophagus. There was clotted blood in the gastric fundus and a single bleeding angioectasia in the stomach that was treated with argon plasma coagulation.No other abnormalities were seen at that time.  It is unclear at this time if her retching has caused irritation to her esophageal lining causing her dysphagia. Will consult SLP and order a modified barium swallow to further assess and  continue Protonix 40 mg when patient can tolerate.   #Alcohol use disorder #Hx of Seizures Disorder 2/2 Alcohol withdrawal Patient has a history of alcohol use disorder, and alcohol induced seizures, drinks about 2 beers and 2 shots of rum every day. Last drink was 2-3 days ago. Follows up with Dr Karel Jarvis ( neurologist ) for her seizure disorder, on Keppra 500 mg BID. Patient reports adherence to her seizure medications.It is however noted  that  her seizure medications in her bag looks untouched. She reported not being able to keep anything down for the past 2 weeks but said she hasn't missed any of her keppra medications. Its unclear at this time if patient is actually taking her keppra as needed. - CIWA with Ativan - Thiamine / Folic acid  - Seizure protocol - Resume home Keppra   #Intergluteal Cleft Wound  Patient requested that we evaluated her  anal canal due to irritation in that area. Please refer to the physical exam above for a picture. I wonder if this is due to her recurrent diarrhea.She denied any current or history of any anal sex whatsoever. It didn't look erythematous on exam. Will get a Herpes swap PCR and apply Gerhardts butt cream at this time.   #Hx if Anemia of Chronic Disease #Hx of Iron deficiency Anemia  Patient has a hx of anaemia. Last Hgb on the 11/20/2022 was 7.9 - CBC with  Differential - Iron studies   #History of Gout - On home Allopurinol. Will hold for now until Kidney function is assessed   Diet:  Sip with Meds VTE: Heparin Code: Full  Prior to Admission Living Arrangement: Home, living with partner  Anticipated Discharge Location: Home Barriers to Discharge: medical work up and treatment   Dispo: Admit patient to Inpatient with expected length of stay greater than 2 midnights.  Signed: Kathleen Lime, MD Internal Medicine Resident PGY-1  12/23/2022, 5:18 PM

## 2022-12-24 ENCOUNTER — Observation Stay (HOSPITAL_COMMUNITY): Payer: BLUE CROSS/BLUE SHIELD

## 2022-12-24 ENCOUNTER — Observation Stay (HOSPITAL_BASED_OUTPATIENT_CLINIC_OR_DEPARTMENT_OTHER): Payer: BLUE CROSS/BLUE SHIELD

## 2022-12-24 ENCOUNTER — Other Ambulatory Visit: Payer: Self-pay

## 2022-12-24 ENCOUNTER — Encounter (HOSPITAL_COMMUNITY): Payer: Self-pay | Admitting: Infectious Diseases

## 2022-12-24 DIAGNOSIS — D631 Anemia in chronic kidney disease: Secondary | ICD-10-CM | POA: Diagnosis not present

## 2022-12-24 DIAGNOSIS — Z7984 Long term (current) use of oral hypoglycemic drugs: Secondary | ICD-10-CM | POA: Diagnosis not present

## 2022-12-24 DIAGNOSIS — R195 Other fecal abnormalities: Secondary | ICD-10-CM | POA: Diagnosis not present

## 2022-12-24 DIAGNOSIS — R112 Nausea with vomiting, unspecified: Secondary | ICD-10-CM | POA: Diagnosis not present

## 2022-12-24 DIAGNOSIS — R008 Other abnormalities of heart beat: Secondary | ICD-10-CM | POA: Diagnosis not present

## 2022-12-24 DIAGNOSIS — D649 Anemia, unspecified: Secondary | ICD-10-CM | POA: Diagnosis not present

## 2022-12-24 DIAGNOSIS — R1310 Dysphagia, unspecified: Secondary | ICD-10-CM | POA: Diagnosis not present

## 2022-12-24 DIAGNOSIS — R578 Other shock: Secondary | ICD-10-CM | POA: Diagnosis not present

## 2022-12-24 DIAGNOSIS — K760 Fatty (change of) liver, not elsewhere classified: Secondary | ICD-10-CM | POA: Diagnosis present

## 2022-12-24 DIAGNOSIS — F1490 Cocaine use, unspecified, uncomplicated: Secondary | ICD-10-CM | POA: Diagnosis present

## 2022-12-24 DIAGNOSIS — N1832 Chronic kidney disease, stage 3b: Secondary | ICD-10-CM | POA: Diagnosis not present

## 2022-12-24 DIAGNOSIS — I129 Hypertensive chronic kidney disease with stage 1 through stage 4 chronic kidney disease, or unspecified chronic kidney disease: Secondary | ICD-10-CM | POA: Diagnosis not present

## 2022-12-24 DIAGNOSIS — F1010 Alcohol abuse, uncomplicated: Secondary | ICD-10-CM | POA: Diagnosis not present

## 2022-12-24 DIAGNOSIS — R7989 Other specified abnormal findings of blood chemistry: Secondary | ICD-10-CM | POA: Diagnosis not present

## 2022-12-24 DIAGNOSIS — R197 Diarrhea, unspecified: Secondary | ICD-10-CM | POA: Diagnosis not present

## 2022-12-24 DIAGNOSIS — E876 Hypokalemia: Secondary | ICD-10-CM | POA: Diagnosis present

## 2022-12-24 DIAGNOSIS — G40909 Epilepsy, unspecified, not intractable, without status epilepticus: Secondary | ICD-10-CM | POA: Diagnosis not present

## 2022-12-24 DIAGNOSIS — R0982 Postnasal drip: Secondary | ICD-10-CM | POA: Diagnosis not present

## 2022-12-24 DIAGNOSIS — N179 Acute kidney failure, unspecified: Secondary | ICD-10-CM

## 2022-12-24 DIAGNOSIS — D500 Iron deficiency anemia secondary to blood loss (chronic): Secondary | ICD-10-CM | POA: Diagnosis not present

## 2022-12-24 DIAGNOSIS — R7401 Elevation of levels of liver transaminase levels: Secondary | ICD-10-CM | POA: Diagnosis not present

## 2022-12-24 DIAGNOSIS — Z881 Allergy status to other antibiotic agents status: Secondary | ICD-10-CM | POA: Diagnosis not present

## 2022-12-24 DIAGNOSIS — I959 Hypotension, unspecified: Secondary | ICD-10-CM | POA: Diagnosis not present

## 2022-12-24 DIAGNOSIS — E782 Mixed hyperlipidemia: Secondary | ICD-10-CM | POA: Diagnosis present

## 2022-12-24 DIAGNOSIS — E038 Other specified hypothyroidism: Secondary | ICD-10-CM | POA: Diagnosis not present

## 2022-12-24 DIAGNOSIS — E440 Moderate protein-calorie malnutrition: Secondary | ICD-10-CM | POA: Diagnosis not present

## 2022-12-24 DIAGNOSIS — K7200 Acute and subacute hepatic failure without coma: Secondary | ICD-10-CM | POA: Diagnosis not present

## 2022-12-24 DIAGNOSIS — E860 Dehydration: Secondary | ICD-10-CM | POA: Diagnosis present

## 2022-12-24 DIAGNOSIS — R110 Nausea: Secondary | ICD-10-CM | POA: Diagnosis not present

## 2022-12-24 DIAGNOSIS — F1090 Alcohol use, unspecified, uncomplicated: Secondary | ICD-10-CM | POA: Diagnosis present

## 2022-12-24 DIAGNOSIS — R1319 Other dysphagia: Secondary | ICD-10-CM | POA: Diagnosis not present

## 2022-12-24 DIAGNOSIS — K626 Ulcer of anus and rectum: Secondary | ICD-10-CM | POA: Diagnosis present

## 2022-12-24 DIAGNOSIS — Z87891 Personal history of nicotine dependence: Secondary | ICD-10-CM | POA: Diagnosis not present

## 2022-12-24 DIAGNOSIS — K7030 Alcoholic cirrhosis of liver without ascites: Secondary | ICD-10-CM | POA: Diagnosis not present

## 2022-12-24 DIAGNOSIS — Y900 Blood alcohol level of less than 20 mg/100 ml: Secondary | ICD-10-CM | POA: Diagnosis present

## 2022-12-24 DIAGNOSIS — K529 Noninfective gastroenteritis and colitis, unspecified: Secondary | ICD-10-CM | POA: Diagnosis not present

## 2022-12-24 DIAGNOSIS — R58 Hemorrhage, not elsewhere classified: Secondary | ICD-10-CM | POA: Diagnosis not present

## 2022-12-24 HISTORY — DX: Nausea with vomiting, unspecified: R11.2

## 2022-12-24 LAB — BASIC METABOLIC PANEL
Anion gap: 17 — ABNORMAL HIGH (ref 5–15)
BUN: 26 mg/dL — ABNORMAL HIGH (ref 6–20)
CO2: 17 mmol/L — ABNORMAL LOW (ref 22–32)
Calcium: 5.2 mg/dL — CL (ref 8.9–10.3)
Chloride: 103 mmol/L (ref 98–111)
Creatinine, Ser: 3.73 mg/dL — ABNORMAL HIGH (ref 0.44–1.00)
GFR, Estimated: 14 mL/min — ABNORMAL LOW (ref >60)
Glucose, Bld: 86 mg/dL (ref 70–99)
Potassium: 2.8 mmol/L — ABNORMAL LOW (ref 3.5–5.1)
Sodium: 137 mmol/L (ref 135–145)

## 2022-12-24 LAB — COMPREHENSIVE METABOLIC PANEL
ALT: 65 U/L — ABNORMAL HIGH (ref 0–44)
AST: 30 U/L (ref 15–41)
Albumin: 2 g/dL — ABNORMAL LOW (ref 3.5–5.0)
Alkaline Phosphatase: 49 U/L (ref 38–126)
Anion gap: 15 (ref 5–15)
BUN: 29 mg/dL — ABNORMAL HIGH (ref 6–20)
CO2: 18 mmol/L — ABNORMAL LOW (ref 22–32)
Calcium: 4.8 mg/dL — CL (ref 8.9–10.3)
Chloride: 104 mmol/L (ref 98–111)
Creatinine, Ser: 4.51 mg/dL — ABNORMAL HIGH (ref 0.44–1.00)
GFR, Estimated: 11 mL/min — ABNORMAL LOW (ref >60)
Glucose, Bld: 102 mg/dL — ABNORMAL HIGH (ref 70–99)
Potassium: 2.2 mmol/L — CL (ref 3.5–5.1)
Sodium: 137 mmol/L (ref 135–145)
Total Bilirubin: 1.3 mg/dL — ABNORMAL HIGH (ref 0.3–1.2)
Total Protein: 4.7 g/dL — ABNORMAL LOW (ref 6.5–8.1)

## 2022-12-24 LAB — CORTISOL-AM, BLOOD: Cortisol - AM: 14.2 ug/dL (ref 6.7–22.6)

## 2022-12-24 LAB — CBC
HCT: 17.9 % — ABNORMAL LOW (ref 36.0–46.0)
Hemoglobin: 5.7 g/dL — CL (ref 12.0–15.0)
MCH: 32.4 pg (ref 26.0–34.0)
MCHC: 31.8 g/dL (ref 30.0–36.0)
MCV: 101.7 fL — ABNORMAL HIGH (ref 80.0–100.0)
Platelets: 160 10*3/uL (ref 150–400)
RBC: 1.76 MIL/uL — ABNORMAL LOW (ref 3.87–5.11)
RDW: 17.2 % — ABNORMAL HIGH (ref 11.5–15.5)
WBC: 7.8 10*3/uL (ref 4.0–10.5)
nRBC: 0.3 % — ABNORMAL HIGH (ref 0.0–0.2)

## 2022-12-24 LAB — ACTH STIMULATION, 3 TIME POINTS
Cortisol, 30 Min: 25.9 ug/dL
Cortisol, 60 Min: 34.9 ug/dL
Cortisol, Base: 11.1 ug/dL

## 2022-12-24 LAB — ECHOCARDIOGRAM COMPLETE
Area-P 1/2: 3.72 cm2
Calc EF: 63.1 %
Height: 62 in
S' Lateral: 2.2 cm
Single Plane A2C EF: 64.7 %
Single Plane A4C EF: 61.7 %
Weight: 1844.81 [oz_av]

## 2022-12-24 LAB — MAGNESIUM
Magnesium: 2.2 mg/dL (ref 1.7–2.4)
Magnesium: 1.6 mg/dL — ABNORMAL LOW (ref 1.7–2.4)

## 2022-12-24 LAB — PREPARE RBC (CROSSMATCH)

## 2022-12-24 LAB — T4, FREE: Free T4: 1.08 ng/dL (ref 0.61–1.12)

## 2022-12-24 LAB — RENAL FUNCTION PANEL
Albumin: 2.1 g/dL — ABNORMAL LOW (ref 3.5–5.0)
Anion gap: 17 — ABNORMAL HIGH (ref 5–15)
BUN: 24 mg/dL — ABNORMAL HIGH (ref 6–20)
CO2: 18 mmol/L — ABNORMAL LOW (ref 22–32)
Calcium: 5.3 mg/dL — CL (ref 8.9–10.3)
Chloride: 102 mmol/L (ref 98–111)
Creatinine, Ser: 3.3 mg/dL — ABNORMAL HIGH (ref 0.44–1.00)
GFR, Estimated: 16 mL/min — ABNORMAL LOW (ref >60)
Glucose, Bld: 71 mg/dL (ref 70–99)
Phosphorus: 1.4 mg/dL — ABNORMAL LOW (ref 2.5–4.6)
Potassium: 3.5 mmol/L (ref 3.5–5.1)
Sodium: 137 mmol/L (ref 135–145)

## 2022-12-24 LAB — GLUCOSE, CAPILLARY: Glucose-Capillary: 81 mg/dL (ref 70–99)

## 2022-12-24 LAB — HEMOGLOBIN AND HEMATOCRIT, BLOOD
HCT: 21.6 % — ABNORMAL LOW (ref 36.0–46.0)
HCT: 26.3 % — ABNORMAL LOW (ref 36.0–46.0)
Hemoglobin: 7.1 g/dL — ABNORMAL LOW (ref 12.0–15.0)
Hemoglobin: 9.2 g/dL — ABNORMAL LOW (ref 12.0–15.0)

## 2022-12-24 LAB — HEPATITIS PANEL, ACUTE
HCV Ab: NONREACTIVE
Hep A IgM: NONREACTIVE
Hep B C IgM: NONREACTIVE
Hepatitis B Surface Ag: NONREACTIVE

## 2022-12-24 LAB — HIV ANTIBODY (ROUTINE TESTING W REFLEX): HIV Screen 4th Generation wRfx: NONREACTIVE

## 2022-12-24 LAB — LACTIC ACID, PLASMA: Lactic Acid, Venous: 1.1 mmol/L (ref 0.5–1.9)

## 2022-12-24 MED ORDER — POTASSIUM CHLORIDE CRYS ER 20 MEQ PO TBCR: 40.0000 meq | EXTENDED_RELEASE_TABLET | Freq: Once | ORAL | Status: DC

## 2022-12-24 MED ORDER — POTASSIUM CHLORIDE 10 MEQ/100ML IV SOLN
10.0000 meq | INTRAVENOUS | Status: AC
  Administered 2022-12-24 (×6): 10 meq via INTRAVENOUS
  Filled 2022-12-24 (×6): qty 100

## 2022-12-24 MED ORDER — MAGNESIUM SULFATE 2 GM/50ML IV SOLN
2.0000 g | Freq: Once | INTRAVENOUS | Status: AC
  Administered 2022-12-24: 2 g via INTRAVENOUS
  Filled 2022-12-24: qty 50

## 2022-12-24 MED ORDER — CALCIUM GLUCONATE-NACL 2-0.675 GM/100ML-% IV SOLN
2.0000 g | Freq: Once | INTRAVENOUS | Status: AC
  Administered 2022-12-24: 2000 mg via INTRAVENOUS
  Filled 2022-12-24: qty 100

## 2022-12-24 MED ORDER — COSYNTROPIN 0.25 MG IJ SOLR
0.2500 mg | Freq: Once | INTRAMUSCULAR | Status: AC
  Administered 2022-12-24: 0.25 mg via INTRAVENOUS
  Filled 2022-12-24: qty 0.25

## 2022-12-24 MED ORDER — SODIUM PHOSPHATES 45 MMOLE/15ML IV SOLN
15.0000 mmol | Freq: Once | INTRAVENOUS | Status: AC
  Administered 2022-12-24: 15 mmol via INTRAVENOUS
  Filled 2022-12-24: qty 5

## 2022-12-24 MED ORDER — LACTATED RINGERS IV BOLUS: 1000.0000 mL | Freq: Once | INTRAVENOUS | Status: DC

## 2022-12-24 MED ORDER — CALCIUM GLUCONATE-NACL 1-0.675 GM/50ML-% IV SOLN
1.0000 g | Freq: Once | INTRAVENOUS | Status: AC
  Administered 2022-12-24: 1000 mg via INTRAVENOUS
  Filled 2022-12-24: qty 50

## 2022-12-24 MED ORDER — PANTOPRAZOLE SODIUM 40 MG IV SOLR
40.0000 mg | Freq: Two times a day (BID) | INTRAVENOUS | Status: DC
  Administered 2022-12-24 – 2022-12-30 (×12): 40 mg via INTRAVENOUS
  Filled 2022-12-24 (×13): qty 10

## 2022-12-24 MED ORDER — SODIUM CHLORIDE 0.9% IV SOLUTION: Freq: Once | INTRAVENOUS | Status: AC

## 2022-12-24 MED ORDER — LACTATED RINGERS IV BOLUS
1000.0000 mL | Freq: Once | INTRAVENOUS | Status: AC
  Administered 2022-12-24: 1000 mL via INTRAVENOUS

## 2022-12-24 MED ORDER — K PHOS MONO-SOD PHOS DI & MONO 155-852-130 MG PO TABS
250.0000 mg | ORAL_TABLET | Freq: Once | ORAL | Status: DC
  Filled 2022-12-24: qty 1

## 2022-12-24 NOTE — Plan of Care (Signed)

## 2022-12-24 NOTE — Consult Note (Signed)
Consultation  Referring Provider: Ginnie Smart, MD Primary Care Physician:  [name], DO Primary Gastroenterologist:  Gentry Fitz  Reason for Consultation: Anemia concern for recent blood loss  HPI: Christina Byrd is a 57 y.o. female, with history of EtOH abuse, ongoing, seizure disorder, chronic kidney disease stage III, GERD.  She has history of previous GI bleeding and had undergone EGD during admission in April 2024 due to melena.  She was found to have some clotted blood in the gastric fundus and a single 5 mm AVM which was treated with APC, she was also noted to have 1 nonbleeding duodenal ulcer in the duodenal bulb, appearing esophagus.  She was seen in the medicine clinic yesterday noted to be hypotensive with blood pressure 71/45.  Patient at that time and expressed she could not remember when she had last eaten but had been having some vomiting and loose stool, denied hematemesis per notes. Recent weight loss reported.  Per the admission notes she had been drinking 1-2 beers daily and at least 2 shots daily with last ETO intake 2 to 3 days ago. No clear history of melena or hematochezia she was documented Hemoccult negative  Labs show WBC of 11.7/hemoglobin 6/hematocrit/18.9/MCV 101 platelets 175 Hemoglobin down from 7.8 in September 2024 Lactate 1.5 Ferritin 895/iron 132/TIBC 154/iron sat 86 Drug screen pending Sodium 142/potassium 3.9/BUN 5/creatinine 0.92 LFTs normal with the exception of AST 57 Albumin 2.1  This morning hemoglobin 5.7/hematocrit 17.9 she was transfused 1 unit of packed RBCs Hemoglobin 7.1 posttransfusion  She has not had any imaging this admission except ultrasound renal-hydronephrosis  Patient has had persistent hypotension today despite blood transfusion She has had 4 fluid boluses and per nursing has brief rise in blood pressure with boluses. She has now been unresponsive over the past 3 to 4 hours and I am unable to arouse her  even with sternal rub.   Past Medical History:  Diagnosis Date   GASTROESOPHAGEAL REFLUX, NO ESOPHAGITIS 04/21/2006   Qualifier: Diagnosis of  By: Levada Schilling     GERD (gastroesophageal reflux disease) Dx 1995   HTN (hypertension)    Seizures (HCC)     Past Surgical History:  Procedure Laterality Date   BIOPSY  06/03/2022   Procedure: BIOPSY;  Surgeon: Jenel Lucks, MD;  Location: [facility] ENDOSCOPY;  Service: Gastroenterology;;   ESOPHAGOGASTRODUODENOSCOPY (EGD) WITH PROPOFOL N/A 06/03/2022   Procedure: ESOPHAGOGASTRODUODENOSCOPY (EGD) WITH PROPOFOL;  Surgeon: Jenel Lucks, MD;  Location: [facility] ENDOSCOPY;  Service: Gastroenterology;  Laterality: N/A;   HOT HEMOSTASIS N/A 06/03/2022   Procedure: HOT HEMOSTASIS (ARGON PLASMA COAGULATION/BICAP);  Surgeon: Jenel Lucks, MD;  Location: [facility] ENDOSCOPY;  Service: Gastroenterology;  Laterality: N/A;    Prior to Admission medications   Medication Sig Start Date End Date Taking? Authorizing Provider  allopurinol (ZYLOPRIM) 100 MG tablet Take 100 mg by mouth daily. Pt takes 1 tablet daily.   Yes [provider]  atorvastatin (LIPITOR) 20 MG tablet Take 1 tablet (20 mg total) by mouth daily. Pt takes 1 tablet daily. 11/22/22  Yes Rhetta Mura, MD  colchicine 0.6 MG tablet Take 0.6 mg by mouth daily.   Yes [provider]  folic acid (FOLVITE) 1 MG tablet Take 1 tablet (1 mg total) by mouth daily. 11/22/22  Yes Rhetta Mura, MD  levETIRAcetam (KEPPRA) 750 MG tablet Take 1 tablet (750 mg total) by mouth 2 (two) times daily. 11/22/22  Yes Rhetta Mura, MD  ondansetron (ZOFRAN-ODT) 4 MG disintegrating  tablet Take 1 tablet (4 mg total) by mouth every 8 (eight) hours as needed. 11/02/22  Yes Jeanelle Malling, PA  pantoprazole (PROTONIX) 40 MG tablet Take 1 tablet (40 mg total) by mouth daily. 11/03/22  Yes Nooruddin, Jason Fila, MD  metFORMIN (GLUCOPHAGE) 500 MG tablet Take 0.5 tablets (250 mg total) by mouth daily with  breakfast. 03/05/20 04/02/20  Anders Simmonds, PA-C    Current Facility-Administered Medications  Medication Dose Route Frequency Provider Last Rate Last Admin   acetaminophen (TYLENOL) tablet 650 mg  650 mg Oral Q6H PRN Morene Crocker, MD   650 mg at 12/24/22 0446   Or   acetaminophen (TYLENOL) suppository 650 mg  650 mg Rectal Q6H PRN Morene Crocker, MD       atorvastatin (LIPITOR) tablet 20 mg  20 mg Oral Daily Morene Crocker, MD       folic acid (FOLVITE) tablet 1 mg  1 mg Oral Daily Morene Crocker, MD   1 mg at 12/23/22 1826   Gerhardt's butt cream   Topical TID Morene Crocker, MD   Given at 12/24/22 1511   levETIRAcetam (KEPPRA) tablet 750 mg  750 mg Oral BID Morene Crocker, MD       Or   levETIRAcetam (KEPPRA) 750 mg in sodium chloride 0.9 % 100 mL IVPB  750 mg Intravenous BID Morene Crocker, MD 430 mL/hr at 12/24/22 1511 Infusion Verify at 12/24/22 1511   LORazepam (ATIVAN) tablet 1-4 mg  1-4 mg Oral Q1H PRN Morene Crocker, MD   1 mg at 12/24/22 0413   multivitamin with minerals tablet 1 tablet  1 tablet Oral Daily Morene Crocker, MD   1 tablet at 12/23/22 1826   pantoprazole (PROTONIX) injection 40 mg  40 mg Intravenous Q12H Gwenevere Abbot, MD   40 mg at 12/24/22 1045   senna-docusate (Senokot-S) tablet 1 tablet  1 tablet Oral QHS PRN Morene Crocker, MD       sodium phosphate 15 mmol in sodium chloride 0.9 % 250 mL infusion  15 mmol Intravenous Once [place], [place], MD 43 mL/hr at 12/24/22 1511 Infusion Verify at 12/24/22 1511   thiamine (VITAMIN B1) tablet 100 mg  100 mg Oral Daily Morene Crocker, MD   100 mg at 12/23/22 1827   Or   thiamine (VITAMIN B1) injection 100 mg  100 mg Intravenous Daily Morene Crocker, MD   100 mg at 12/24/22 1104    Allergies as of 12/23/2022 - Review Complete 12/23/2022  Allergen Reaction Noted   Losartan potassium  12/20/2012   Levaquin  [levofloxacin in d5w] Rash 02/06/2013   Ace inhibitors Cough 01/30/2008   Codeine Nausea Only 05/26/2015   Cefepime Rash 09/05/2012   Chlorhexidine Itching and Rash 07/09/2022   Vancomycin Rash 09/05/2012    Family History  Problem Relation Age of Onset   CAD Mother     Social History   Socioeconomic History   Marital status: Single    Spouse name: Not on file   Number of children: Not on file   Years of education: Not on file   Highest education level: Not on file  Occupational History   Not on file  Tobacco Use   Smoking status: Former    Current packs/day: 0.00    Types: Cigarettes    Quit date: 06/25/2012    Years since quitting: 10.5   Smokeless tobacco: Never  Vaping Use   Vaping status: Never Used  Substance and Sexual Activity   Alcohol use: Not Currently  Alcohol/week: 49.0 standard drinks of alcohol    Types: 14 Cans of beer, 35 Shots of liquor per week   Drug use: No   Sexual activity: Not Currently  Other Topics Concern   Not on file  Social History Narrative   Pt lives in single story home with her partner   Has 1 son   5 grandchildren   10th grade education   Works at [employer].    Right handed   Social Determinants of Health   Financial Resource Strain: Not on file  Food Insecurity: No Food Insecurity (12/23/2022)   Hunger Vital Sign    Worried About Running Out of Food in the Last Year: Never true    Ran Out of Food in the Last Year: Never true  Transportation Needs: No Transportation Needs (12/23/2022)   PRAPARE - Administrator, Civil Service (Medical): No    Lack of Transportation (Non-Medical): No  Physical Activity: Not on file  Stress: Not on file  Social Connections: Not on file  Intimate Partner Violence: Not At Risk (12/23/2022)   Humiliation, Afraid, Rape, and Kick questionnaire    Fear of Current or Ex-Partner: No    Emotionally Abused: No    Physically Abused: No    Sexually Abused: No    Review of  Systems: Pt unable to provide  Physical Exam: Vital signs in last 24 hours: Temp:  [97.6 F (36.4 C)-99 F (37.2 C)] 98.6 F (37 C) (11/01 1125) Pulse Rate:  [60-115] 96 (11/01 1041) Resp:  [13-20] 16 (11/01 1041) BP: (57-138)/(45-118) 91/58 (11/01 1125) SpO2:  [93 %-100 %] 96 % (11/01 0653) Weight:  [52.3 kg] 52.3 kg (11/01 0653) Last BM Date : 12/23/22 General:   Alert,  Well-developed, thin older African-American female, unresponsive, unable to arouse even with sternal rub.  Blood pressure 84/40 at pulse in the 90s, O2 sat 96 Head:  Normocephalic and atraumatic. Eyes:  Sclera clear, no icterus.   Conjunctiva pink. Ears:  Normal auditory acuity. Nose:  No deformity, discharge,  or lesions. Mouth:  No deformity or lesions.   Neck:  Supple; no masses or thyromegaly. Lungs:  Clear throughout to auscultation.   No wheezes, crackles, or rhonchi.  Heart: tachy regular rate and rhythm; no murmurs, clicks, rubs,  or gallops. Abdomen:  Soft,nontender, nondistended  -palpable mass or hepatosplenomegaly bowel sounds are present Rectal: Not done, reported heme-negative on admission per medicine team Msk:  Symmetrical without gross deformities. . Pulses:  Normal pulses noted. Extremities:  Without clubbing or edema. Neurologic: obtunded Skin:  Intact without significant lesions or rashes..  Intake/Output from previous day: 10/31 0701 - 11/01 0700 In: 315 [Blood:315] Out: -  Intake/Output this shift: Total I/O In: 3313.9 [I.V.:31.3; Blood:295; IV Piggyback:2987.6] Out: -   Lab Results: Recent Labs    12/23/22 2137 12/24/22 0110 12/24/22 0645  WBC 11.7* 7.8  --   HGB 6.0* 5.7* 7.1*  HCT 18.9* 17.9* 21.6*  PLT 175 160  --    BMET Recent Labs    12/24/22 0111 12/24/22 0645 12/24/22 1215  NA 137 137 137  K 2.2* 2.8* 3.5  CL 104 103 102  CO2 18* 17* 18*  GLUCOSE 102* 86 71  BUN 29* 26* 24*  CREATININE 4.51* 3.73* 3.30*  CALCIUM 4.8* 5.2* 5.3*   LFT Recent Labs     12/24/22 0111 12/24/22 1215  PROT 4.7*  --   ALBUMIN 2.0* 2.1*  AST 30  --  ALT 65*  --   ALKPHOS 49  --   BILITOT 1.3*  --    PT/INR No results for input(s): "LABPROT", "INR" in the last 72 hours. Hepatitis Panel Recent Labs    12/24/22 1215  HEPBSAG NON REACTIVE  HCVAB NON REACTIVE  HEPAIGM NON REACTIVE  HEPBIGM NON REACTIVE    IMPRESSION:  #2 57 year old female with history of chronic EtOH abuse, seizure disorder and prior history of GI bleeding felt secondary to gastric AVM with EGD 4/24 revealing 1 gastric AVM which was treated with APC, and also noted a nonbleeding duodenal bulb ulcer.  Patient admitted yesterday after found to have significant hypotension  Documented heme-negative on admission Hemoglobin down almost 2 g from her prior baseline  She is clearly not having any active GI bleed, she certainly may have had some slow GI blood loss over the past month, although it sounds as if her nutrition has been very poor and she has not been eating so anemia may be multifactoral  #2 persistent hypotension despite transfusion and volume replacement This does not appear to be secondary to GI bleeding, needs workup for other etiologies  #3 altered mental status/lethargy now nonresponsive #4 hypomagnesemia/severe hypokalemia replaced  Plan; twice daily PPI Trend hemoglobin and transfuse to keep her hemoglobin 7 No indication for EGD at present Further management of persistent hypotension per medicine teams      Casara Perrier PA-C 12/24/2022, 3:26 PM

## 2022-12-24 NOTE — Progress Notes (Addendum)
HD#0 SUBJECTIVE:  Patient Summary: Christina Byrd is a 57 y.o. with a PMH of alcohol use do, CKD stage 3b, GERD, Seizure do, angiodysplasia of the stomach and duodenum, duodenal ulcers, hematemesis and melena 2/2 to NSAID and alcohol use presenting with as an admission from clinic due to hypotension.  Overnight Events: She needed a unit of blood. Was hypokalemic, hypocalcemic, and hypomagnesemic. Remained hypotensive.  Interim History: States that the dysphagia has been ongoing for three months. Started abruptly.   OBJECTIVE:  Vital Signs: Vitals:   12/24/22 0322 12/24/22 0354 12/24/22 0455 12/24/22 0653  BP: (!) 94/55 122/71 103/68 (!) 88/47  Pulse: (!) 110 (!) 107 (!) 110 (!) 101  Resp: 14 16 20 17   Temp: 98.9 F (37.2 C)   98.9 F (37.2 C)  TempSrc: Oral   Oral  SpO2: 100% 100% 100% 96%  Weight:    52.3 kg  Height:    5\' 2"  (1.575 m)   Supplemental O2: Room Air SpO2: 96 %  Filed Weights   12/24/22 0653  Weight: 52.3 kg     Intake/Output Summary (Last 24 hours) at 12/24/2022 0947 Last data filed at 12/24/2022 0981 Gross per 24 hour  Intake 315 ml  Output --  Net 315 ml   Net IO Since Admission: 315 mL [12/24/22 0947]  Physical Exam: Physical Exam Exam conducted with a chaperone present.  Constitutional:      Appearance: She is ill-appearing and toxic-appearing.  HENT:     Head: Atraumatic.     Mouth/Throat:     Mouth: Mucous membranes are dry.  Eyes:     General: Scleral icterus present.  Cardiovascular:     Rate and Rhythm: Normal rate and regular rhythm.     Heart sounds: No murmur heard.    No friction rub. No gallop.  Pulmonary:     Effort: No respiratory distress.     Breath sounds: No wheezing, rhonchi or rales.  Abdominal:     General: Abdomen is flat. There is no distension.     Palpations: Abdomen is soft.     Tenderness: There is no abdominal tenderness. There is no guarding or rebound.  Genitourinary:    Rectum: Guaiac result  negative.     Comments: Erosions on the bilateral buttocks surrounding the rectum. Consent obtained from the patient for rectal examination. DRE negative for blood. Normal spincter tone.  Musculoskeletal:        General: No swelling.  Skin:    Capillary Refill: Capillary refill takes less than 2 seconds.     Coloration: Skin is pale.   FOBT negative  Patient Lines/Drains/Airways Status     Active Line/Drains/Airways     Name Placement date Placement time Site Days   Peripheral IV 12/23/22 22 G 1.75" Anterior;Left Forearm 12/23/22  1758  Forearm  1   Peripheral IV 12/24/22 22 G Anterior;Distal;Left Forearm 12/24/22  0200  Forearm  less than 1   Wound / Incision (Open or Dehisced) 12/23/22 Irritant Dermatitis (Moisture Associated Skin Damage) Other (Comment) Right;Left 12/23/22  2110  Other (Comment)  1             ASSESSMENT/PLAN:  Assessment: Principal Problem:   Nausea & vomiting Active Problems:   Dysphagia   Alcohol related seizure (HCC)   Christina Byrd is a 57 y.o. with a PMH of alcohol use do, CKD stage 3b, GERD, Seizure do, angiodysplasia of the stomach and duodenum, duodenal ulcers, hematemesis and melena 2/2 to  NSAID and alcohol use presenting with as an admission from clinic due to hypotension.   Plan:  #Hypotension #Dysphagia #Diarrhea  #Nausea and Vomiting  Remains hypotensive overnight. S/p 1 unit of blood and 3L LR. Echo not concerning for heart failure. An additional 1L LR this am. She is afebrile and WBC is WNL. Lactic was not elevated last night. CXR not concerning for PNA. Erosions in buttocks but not ulcerated. With hypokalemia, hypomagenesemia, and hypophosphatemia may be related to fluid loss and decreased PO intake. Presentation worsened by anemia. I wonder if she has a malignancy, stricture or obstruction due to her alcohol use as well. States that her dysphagia started about three months ago. Patient too weak to obtain additional history at this  time. Diarrhea concerning for infectious etiology. Denies dysuria and no suprapubic pain. AM cortisol indeterminate this AM will follow with ACTH stim test. Previously also tested negative for marijuana but could also be cyclical vomiting syndrome.  -Appreciate PCCM's involvement with this case  -Continue to monitor vitals, if she remains hypotensive will need to go to ICU -GI consulted  -GI panel pending -UA pending  -UDS pending -Renal US pending  -Free T4, T3  -ACTH stim test -Strict I/Os -Daily weights  #Acute on chronic Anemia  She does have a history of angiodysplasias of the stomach and duodenum, duodenal ulcers, hematemesis and melena 2/2 alcohol use do and NSAIDs in April of this year. Patient is too weak to provide additional history at this time. No blood on digital rectal exam and FOBT is negative. However, Hgb at baseline is between 8-10. ~8 at last admission. Will give an additional unit of blood. Iron studies consistent with anemia of chronic disease. Iron 132, doubt this is iron deficiency anemia. May be bleeding despite negative tests as she remains hypotensive. -currently receiving second unit of blood  -follow up with HH  -trend CBC  -GI consulted  #Alcohol use disorder  #Seizure do  Continue monitoring CIWA scores. Ativan ordered.  Cw Thiamine / Folic acid Cw Seizure protocol Cw Keppra   #AKI on CKD stage 3b  S/p 4L LR  Monitor RFP  US renal   UA  #Hypocalcemia  Had a prolonged QT. S/p calcium gluconate. Repeat EKG still showing prolongation at 476. Repeat Calcium (corrected) is 6.8. Will give 2g of IV calcium gluconate.   #Hypomagnesemia #Hypokalemia #Hypophosphatemia  Could be due to malnutrition, N/V and diarrhea. Replete as needed. NPO for now.   #Elevated TSH  TSH is 6. Likely because she is sick. Would need repeat once she is stable. Adding Free T4 and T3.   #Intergluteal Cleft Wound  Erosions on the buttocks surrounding the rectum. May be  related to increased diarrhea. Not open, does not look purulent. No vesicle formation. Looks superficial. Continue to monitor for signs of infection.  HSV swab pending  Gerhards butt cream  HIV  Heptatitis panel  GC testing   #History of Gout - continue holding allopurinol at this time  Best Practice: Diet: NPO IVF: Fluids: LR, Rate:  4000 cc bolus VTE: Place and maintain sequential compression device Start: 12/23/22 2220 Code: Full DISPO: Anticipated discharge in 5 days  pending  medical work-up .  Signature: [facility]  Internal Medicine Resident, PGY-1 [facility]  Pager: [phone] 9:47 AM, 12/24/2022   Please contact the on call pager after 5 pm and on weekends at [phone].

## 2022-12-24 NOTE — Progress Notes (Signed)
  Echocardiogram 2D Echocardiogram has been performed.  Christina Byrd 12/24/2022, 9:13 AM

## 2022-12-24 NOTE — Progress Notes (Signed)
PT Cancellation Note  Patient Details Name: Christina Byrd MRN: [id number] DOB: 25-Mar-1965   Cancelled Treatment:    Reason Eval/Treat Not Completed: Medical issues which prohibited therapy - pt lethargic, RN starting blood transfusion and pt remains hypotensive. PT to check back at a later date.   Marye Round, PT DPT Acute Rehabilitation Services Secure Chat Preferred  Office [phone]    Oluwasemilore Pascuzzi Sheliah Plane 12/24/2022, 10:28 AM

## 2022-12-24 NOTE — Plan of Care (Signed)
  Problem: Clinical Measurements: Goal: Ability to maintain clinical measurements within normal limits will improve Outcome: Progressing Goal: Diagnostic test results will improve Outcome: Progressing Goal: Respiratory complications will improve Outcome: Progressing Goal: Cardiovascular complication will be avoided Outcome: Progressing   

## 2022-12-24 NOTE — Progress Notes (Signed)
SLP Cancellation Note  Patient Details Name: Christina Byrd MRN: [id number] DOB: Feb 16, 1966   Cancelled treatment:       Reason Eval/Treat Not Completed: Fatigue/lethargy limiting ability to participate Patient arouses briefly but quickly falls back to sleep.  Ferdinand Lango MA, CCC-SLP   [name] [name] 12/24/2022, 9:32 AM

## 2022-12-24 NOTE — Progress Notes (Signed)
Internal Medicine Clinic Attending  I was physically present during the key portions of the resident provided service and participated in the medical decision making of patient's management care. I reviewed pertinent patient test results.  The assessment, diagnosis, and plan were formulated together and I agree with the documentation in the resident's note.  Patient here with severe symptomatic hypotension due to progressive dysphagia; inability to tolerate PO intake and food regurgitation. We have been unable to arrange outpatient follow up thus far. Symptoms are severe are warrant inpatient admission for IVFs, expedited GI eval. Maybe SLP with barium swallow but her symptoms sound more esophageal.   Christina Poll, MD

## 2022-12-24 NOTE — Progress Notes (Signed)
CSW following to complete consult when appropriate.

## 2022-12-24 NOTE — Consult Note (Signed)
NAME:  Christina Byrd, MRN:  [id number], DOB:  Jun 22, 1965, LOS: 0 ADMISSION DATE:  12/23/2022, CONSULTATION DATE: 12/24/2022 REFERRING MD: Internal medicine teaching service, CHIEF COMPLAINT: Hypertension  History of Present Illness:  57 year old female who is a daily drinker with a history of seizures questionable compliant with medication, per significant other reportedly having black tarry stools noted to be anemic, now in acute renal failure with electrolyte disturbances with poor air p.o. intake nausea vomiting and diarrhea.  She is now hypotensive but has not been fully fluid resuscitated at this time.  She is also anemic with a reported black tarry stools for her significant other with a hemoglobin decreased.  Pulmonary critical care asked to evaluate agree with current interventions will continue to fluid resuscitate, GI consult, replete electrolytes.  Pertinent  Medical History   Past Medical History:  Diagnosis Date   GASTROESOPHAGEAL REFLUX, NO ESOPHAGITIS 04/21/2006   Qualifier: Diagnosis of  By: Levada Schilling     GERD (gastroesophageal reflux disease) Dx 1995   HTN (hypertension)    Seizures (HCC)      Significant Hospital Events: Including procedures, antibiotic start and stop dates in addition to other pertinent events     Interim History / Subjective:  Awake agitated blood pressure while fluids running at 999 108/72  Objective   Blood pressure (!) 88/47, pulse (!) 101, temperature 98.9 F (37.2 C), temperature source Oral, resp. rate 17, height 5\' 2"  (1.575 m), weight 52.3 kg, last menstrual period 08/23/2011, SpO2 96%.        Intake/Output Summary (Last 24 hours) at 12/24/2022 0934 Last data filed at 12/24/2022 4098 Gross per 24 hour  Intake 315 ml  Output --  Net 315 ml   Filed Weights   12/24/22 0653  Weight: 52.3 kg    Examination: General: Agitated female complaining of pain of her left arm from potassium HENT: No JVD or lymphadenopathy is  appreciated Lungs: Diminished in the bases Cardiovascular: Heart sounds are regular with a tachycardic at 105 Abdomen: Soft nontender positive bowel sounds Extremities: Warm dry without edema Neuro: Difficult to focus but can focus and follow commands GU: Poorly is not making urine at this time  Resolved Hospital Problem list     Assessment & Plan:  Hypotension in the setting of blood loss anemia with black tarry stools with history of GI bleed with continued alcohol abuse.  I suspect that she is dehydrated. Anemic  Recent Labs    12/24/22 0110 12/24/22 0645  HGB 5.7* 7.1*    Continue fluid resuscitation Continue transfusions to keep hemoglobin greater than 7 GI consult She does not need intensive care at this time Check coags GI consult  Electrolyte disturbances in the setting of malnutrition Replete as needed  Acute renal failure Lab Results  Component Value Date   CREATININE 4.51 (H) 12/24/2022   CREATININE 0.92 11/22/2022   CREATININE 1.20 (H) 11/21/2022   CREATININE 1.00 03/21/2014   CREATININE 0.76 12/11/2013   Fluid resuscitation If creatinine does not improve consider CT of the abdomen  Alcohol abuse Folic acid and thiamine CIWA protocol  History of seizures noted to have Keppra on her MAR Continue Keppra   Best Practice (right click and "Reselect all SmartList Selections" daily)   Diet/type: NPO DVT prophylaxis: not indicated GI prophylaxis: PPI Lines: N/A Foley:  N/A Code Status:  full code Last date of multidisciplinary goals of care discussion [tbd] Her significant other reports that she does not eat or drink but consumes  alcohol and has had black tarry stools. Labs   CBC: Recent Labs  Lab 12/23/22 2137 12/24/22 0110 12/24/22 0645  WBC 11.7* 7.8  --   NEUTROABS 8.2*  --   --   HGB 6.0* 5.7* 7.1*  HCT 18.9* 17.9* 21.6*  MCV 101.6* 101.7*  --   PLT 175 160  --     Basic Metabolic Panel: Recent Labs  Lab 12/23/22 2137  12/24/22 0110 12/24/22 0111  NA  --   --  137  K  --   --  2.2*  CL  --   --  104  CO2  --   --  18*  GLUCOSE  --   --  102*  BUN  --   --  29*  CREATININE  --   --  4.51*  CALCIUM  --   --  4.8*  MG <0.5* 2.2  --   PHOS 1.5*  --   --    GFR: Estimated Creatinine Clearance: 10.9 mL/min (A) (by C-G formula based on SCr of 4.51 mg/dL (H)). Recent Labs  Lab 12/23/22 2137 12/24/22 0110  WBC 11.7* 7.8  LATICACIDVEN 1.5  --     Liver Function Tests: Recent Labs  Lab 12/24/22 0111  AST 30  ALT 65*  ALKPHOS 49  BILITOT 1.3*  PROT 4.7*  ALBUMIN 2.0*   No results for input(s): "LIPASE", "AMYLASE" in the last 168 hours. No results for input(s): "AMMONIA" in the last 168 hours.  ABG    Component Value Date/Time   HCO3 10.6 (L) 09/27/2022 2116   TCO2 12 (L) 09/27/2022 2118   ACIDBASEDEF 17.0 (H) 09/27/2022 2116   O2SAT 83 09/27/2022 2116     Coagulation Profile: No results for input(s): "INR", "PROTIME" in the last 168 hours.  Cardiac Enzymes: No results for input(s): "CKTOTAL", "CKMB", "CKMBINDEX", "TROPONINI" in the last 168 hours.  HbA1C: HbA1c, POC (controlled diabetic range)  Date/Time Value Ref Range Status  03/24/2021 11:05 AM 5.5 0.0 - 7.0 % Final  11/17/2020 04:13 PM 5.4 0.0 - 7.0 % Final   Hgb A1c MFr Bld  Date/Time Value Ref Range Status  07/11/2022 04:03 AM 5.0 4.8 - 5.6 % Final    Comment:    (NOTE) Pre diabetes:          5.7%-6.4%  Diabetes:              >6.4%  Glycemic control for   <7.0% adults with diabetes   04/27/2022 11:25 AM 4.6 (L) 4.8 - 5.6 % Final    Comment:             Prediabetes: 5.7 - 6.4          Diabetes: >6.4          Glycemic control for adults with diabetes: <7.0     CBG: Recent Labs  Lab 12/24/22 0350  GLUCAP 81    Review of Systems:   10 point review of system taken, please see HPI for positives and negatives. Her significant other reports she has multiple drinks during the day.  She reports she has had  black stools several times.  Past Medical History:  She,  has a past medical history of GASTROESOPHAGEAL REFLUX, NO ESOPHAGITIS (04/21/2006), GERD (gastroesophageal reflux disease) (Dx 1995), HTN (hypertension), and Seizures (HCC).   Surgical History:   Past Surgical History:  Procedure Laterality Date   BIOPSY  06/03/2022   Procedure: BIOPSY;  Surgeon: Jenel Lucks, MD;  Location:  [facility] ENDOSCOPY;  Service: Gastroenterology;;   ESOPHAGOGASTRODUODENOSCOPY (EGD) WITH PROPOFOL N/A 06/03/2022   Procedure: ESOPHAGOGASTRODUODENOSCOPY (EGD) WITH PROPOFOL;  Surgeon: Jenel Lucks, MD;  Location: [facility] ENDOSCOPY;  Service: Gastroenterology;  Laterality: N/A;   HOT HEMOSTASIS N/A 06/03/2022   Procedure: HOT HEMOSTASIS (ARGON PLASMA COAGULATION/BICAP);  Surgeon: Jenel Lucks, MD;  Location: [facility] ENDOSCOPY;  Service: Gastroenterology;  Laterality: N/A;     Social History:   reports that she quit smoking about 10 years ago. Her smoking use included cigarettes. She has never used smokeless tobacco. She reports that she does not currently use alcohol after a past usage of about 49.0 standard drinks of alcohol per week. She reports that she does not use drugs.   Family History:  Her family history includes CAD in her mother.   Allergies Allergies  Allergen Reactions   Losartan Potassium     Rash   Levaquin [Levofloxacin In D5w] Rash   Ace Inhibitors Cough    REACTION: cough   Codeine Nausea Only   Cefepime Rash    09/04/12 pm Patient started to break out with small macules after IV Vanco infusion, then macules increased in size after starting cefepime infusion.   Chlorhexidine Itching and Rash   Vancomycin Rash    09/04/12 pm Patient started to break out with small macules after IV Vanco infusion, then macules increased in size after starting cefepime infusion.     Home Medications  Prior to Admission medications   Medication Sig Start Date End Date Taking? Authorizing Provider   allopurinol (ZYLOPRIM) 100 MG tablet Take 100 mg by mouth daily. Pt takes 1 tablet daily.   Yes [provider]  atorvastatin (LIPITOR) 20 MG tablet Take 1 tablet (20 mg total) by mouth daily. Pt takes 1 tablet daily. 11/22/22  Yes Rhetta Mura, MD  colchicine 0.6 MG tablet Take 0.6 mg by mouth daily.   Yes [provider]  folic acid (FOLVITE) 1 MG tablet Take 1 tablet (1 mg total) by mouth daily. 11/22/22  Yes Rhetta Mura, MD  levETIRAcetam (KEPPRA) 750 MG tablet Take 1 tablet (750 mg total) by mouth 2 (two) times daily. 11/22/22  Yes Rhetta Mura, MD  ondansetron (ZOFRAN-ODT) 4 MG disintegrating tablet Take 1 tablet (4 mg total) by mouth every 8 (eight) hours as needed. 11/02/22  Yes Jeanelle Malling, PA  pantoprazole (PROTONIX) 40 MG tablet Take 1 tablet (40 mg total) by mouth daily. 11/03/22  Yes Nooruddin, Jason Fila, MD  metFORMIN (GLUCOPHAGE) 500 MG tablet Take 0.5 tablets (250 mg total) by mouth daily with breakfast. 03/05/20 04/02/20  Anders Simmonds, PA-C     Critical care time:     Brett Canales Chundra Sauerwein ACNP Acute Care Nurse Practitioner [facility] Pulmonary/Critical Care Please consult Amion 12/24/2022, 9:34 AM

## 2022-12-24 NOTE — Progress Notes (Addendum)
Update from H&P:  Labs/tests that have since resulted and intervention(s) provided:  Mg 0.5: gave patient 4g magnesium sulfate and mg came up to 2.2  Hb 6.0: transfused 1u PRBC, post transfusion H&H still not collected  K 2.2: ordered IV Kcl 10 mEq x 6 and PO potassium 40 mEq, however, patient is too somnolent to take it by mouth. Starting with just IV repletion for now and will repeat BMP at 10 AM.   Ca 4.8, corrects to 6.4. Gave patient 1 g calcium gluconate.  BUN 29, Cr 4.51 (was 0.9-1.3 one month ago): BUN/Cr ratio calculated to be ~6, concerning for intrinsic etiology of AKI. Ordered urinalysis + renal ultrasound. Also giving another 1 L LR for this and her low-normal blood pressure.   EKG: consistent with sinus tachycardia with prolonged Qtc of 486. Prolonged QT could be explained by severe hypokalemia/hypocalcemia.  Her hypotension and electrolyte abnormalities can be explained by her ongoing diarrhea and vomiting, as noted in the H&P, but with the severity of the abnormalities I am concerned there may be something else going on to. Her AKI also could be explained by vomiting and diarrhea, but the BUN/Cr ratio is more consistent with an intrinsic etiology of AKI, rather than pre-renal.

## 2022-12-25 ENCOUNTER — Inpatient Hospital Stay (HOSPITAL_COMMUNITY): Payer: BLUE CROSS/BLUE SHIELD

## 2022-12-25 ENCOUNTER — Other Ambulatory Visit: Payer: Self-pay

## 2022-12-25 LAB — URINALYSIS, COMPLETE (UACMP) WITH MICROSCOPIC
Bacteria, UA: NONE SEEN
Bilirubin Urine: NEGATIVE
Glucose, UA: NEGATIVE mg/dL
Ketones, ur: NEGATIVE mg/dL
Leukocytes,Ua: NEGATIVE
Nitrite: NEGATIVE
Protein, ur: NEGATIVE mg/dL
Specific Gravity, Urine: 1.012 (ref 1.005–1.030)
pH: 5 (ref 5.0–8.0)

## 2022-12-25 LAB — GASTROINTESTINAL PANEL BY PCR, STOOL (REPLACES STOOL CULTURE)

## 2022-12-25 LAB — TYPE AND SCREEN
ABO/RH(D): B NEG
Antibody Screen: NEGATIVE
Unit division: 0
Unit division: 0

## 2022-12-25 LAB — CBC
HCT: 24.5 % — ABNORMAL LOW (ref 36.0–46.0)
Hemoglobin: 8.6 g/dL — ABNORMAL LOW (ref 12.0–15.0)
MCH: 31.3 pg (ref 26.0–34.0)
MCHC: 35.1 g/dL (ref 30.0–36.0)
MCV: 89.1 fL (ref 80.0–100.0)
Platelets: 152 10*3/uL (ref 150–400)
RBC: 2.75 MIL/uL — ABNORMAL LOW (ref 3.87–5.11)
RDW: 18 % — ABNORMAL HIGH (ref 11.5–15.5)
WBC: 6.7 10*3/uL (ref 4.0–10.5)
nRBC: 1.5 % — ABNORMAL HIGH (ref 0.0–0.2)

## 2022-12-25 LAB — BPAM RBC
Blood Product Expiration Date: 202411192359
Blood Product Expiration Date: 202411192359
ISSUE DATE / TIME: 202411010104
ISSUE DATE / TIME: 202411011007
Unit Type and Rh: 1700
Unit Type and Rh: 1700

## 2022-12-25 LAB — RENAL FUNCTION PANEL
Albumin: 1.8 g/dL — ABNORMAL LOW (ref 3.5–5.0)
Albumin: 2 g/dL — ABNORMAL LOW (ref 3.5–5.0)
Anion gap: 17 — ABNORMAL HIGH (ref 5–15)
Anion gap: 13 (ref 5–15)
BUN: 22 mg/dL — ABNORMAL HIGH (ref 6–20)
BUN: 17 mg/dL (ref 6–20)
CO2: 17 mmol/L — ABNORMAL LOW (ref 22–32)
CO2: 16 mmol/L — ABNORMAL LOW (ref 22–32)
Calcium: 5.5 mg/dL — CL (ref 8.9–10.3)
Calcium: 6.1 mg/dL — CL (ref 8.9–10.3)
Chloride: 107 mmol/L (ref 98–111)
Chloride: 110 mmol/L (ref 98–111)
Creatinine, Ser: 2.31 mg/dL — ABNORMAL HIGH (ref 0.44–1.00)
Creatinine, Ser: 1.75 mg/dL — ABNORMAL HIGH (ref 0.44–1.00)
GFR, Estimated: 24 mL/min — ABNORMAL LOW (ref >60)
GFR, Estimated: 34 mL/min — ABNORMAL LOW (ref >60)
Glucose, Bld: 82 mg/dL (ref 70–99)
Glucose, Bld: 77 mg/dL (ref 70–99)
Phosphorus: 2.2 mg/dL — ABNORMAL LOW (ref 2.5–4.6)
Phosphorus: 1.2 mg/dL — ABNORMAL LOW (ref 2.5–4.6)
Potassium: 2.9 mmol/L — ABNORMAL LOW (ref 3.5–5.1)
Potassium: 3.8 mmol/L (ref 3.5–5.1)
Sodium: 141 mmol/L (ref 135–145)
Sodium: 139 mmol/L (ref 135–145)

## 2022-12-25 LAB — MAGNESIUM
Magnesium: 1.9 mg/dL (ref 1.7–2.4)
Magnesium: 1.6 mg/dL — ABNORMAL LOW (ref 1.7–2.4)
Magnesium: 2.8 mg/dL — ABNORMAL HIGH (ref 1.7–2.4)

## 2022-12-25 LAB — C DIFFICILE QUICK SCREEN W PCR REFLEX
C Diff antigen: NEGATIVE
C Diff interpretation: NOT DETECTED
C Diff toxin: NEGATIVE

## 2022-12-25 LAB — PHOSPHORUS: Phosphorus: 1.4 mg/dL — ABNORMAL LOW (ref 2.5–4.6)

## 2022-12-25 LAB — POTASSIUM: Potassium: 3.6 mmol/L (ref 3.5–5.1)

## 2022-12-25 MED ORDER — MAGNESIUM SULFATE 4 GM/100ML IV SOLN
4.0000 g | Freq: Once | INTRAVENOUS | Status: AC
  Administered 2022-12-25: 4 g via INTRAVENOUS
  Filled 2022-12-25: qty 100

## 2022-12-25 MED ORDER — SODIUM PHOSPHATES 45 MMOLE/15ML IV SOLN
15.0000 mmol | Freq: Once | INTRAVENOUS | Status: AC
  Administered 2022-12-25: 15 mmol via INTRAVENOUS
  Filled 2022-12-25: qty 5

## 2022-12-25 MED ORDER — ENSURE ENLIVE PO LIQD
237.0000 mL | Freq: Three times a day (TID) | ORAL | Status: DC
  Administered 2022-12-25 – 2022-12-30 (×12): 237 mL via ORAL

## 2022-12-25 MED ORDER — LEVETIRACETAM IN NACL 500 MG/100ML IV SOLN
500.0000 mg | Freq: Two times a day (BID) | INTRAVENOUS | Status: DC
  Administered 2022-12-25 – 2022-12-28 (×2): 500 mg via INTRAVENOUS
  Filled 2022-12-25 (×11): qty 100

## 2022-12-25 MED ORDER — CARMEX CLASSIC LIP BALM EX OINT
TOPICAL_OINTMENT | CUTANEOUS | Status: DC | PRN
  Filled 2022-12-25: qty 10

## 2022-12-25 MED ORDER — CALCIUM GLUCONATE-NACL 2-0.675 GM/100ML-% IV SOLN
2.0000 g | Freq: Once | INTRAVENOUS | Status: DC
  Filled 2022-12-25: qty 100

## 2022-12-25 MED ORDER — HYDROCORTISONE 1 % EX CREA
TOPICAL_CREAM | Freq: Three times a day (TID) | CUTANEOUS | Status: DC | PRN
  Filled 2022-12-25: qty 28

## 2022-12-25 MED ORDER — LEVETIRACETAM 500 MG PO TABS
500.0000 mg | ORAL_TABLET | Freq: Two times a day (BID) | ORAL | Status: DC
  Administered 2022-12-26 – 2022-12-30 (×8): 500 mg via ORAL
  Filled 2022-12-25 (×8): qty 1

## 2022-12-25 MED ORDER — IOHEXOL 9 MG/ML PO SOLN
500.0000 mL | ORAL | Status: AC
  Administered 2022-12-25 (×2): 500 mL via ORAL

## 2022-12-25 MED ORDER — POTASSIUM CHLORIDE 10 MEQ/100ML IV SOLN
10.0000 meq | INTRAVENOUS | Status: AC
  Administered 2022-12-25 (×6): 10 meq via INTRAVENOUS
  Filled 2022-12-25 (×6): qty 100

## 2022-12-25 MED ORDER — LOPERAMIDE HCL 2 MG PO CAPS
2.0000 mg | ORAL_CAPSULE | Freq: Once | ORAL | Status: AC
  Administered 2022-12-25: 2 mg via ORAL
  Filled 2022-12-25: qty 1

## 2022-12-25 MED ORDER — CALCIUM GLUCONATE-NACL 2-0.675 GM/100ML-% IV SOLN
2.0000 g | Freq: Once | INTRAVENOUS | Status: AC
  Administered 2022-12-25: 2000 mg via INTRAVENOUS
  Filled 2022-12-25: qty 100

## 2022-12-25 NOTE — Procedures (Signed)
Modified Barium Swallow Study  Patient Details  Name: Christina Byrd MRN: [id number] Date of Birth: December 09, 1965  Today's Date: 12/25/2022  Modified Barium Swallow completed.  Full report located under Chart Review in the Imaging Section.  History of Present Illness Patient is a 57 y.o. female with PMH: ETOH use disorder, CKD stage 3b, GERD, seizure disorder, angiodysplasia of the stomach and duodenum, duodenal ulcers, hematemesis and melena secondary to NSAID and ETOH use. She presented to the hospital on 12/23/2022 from clinic due to hypotension. Patient reported ongoing episodes of dysphagia, diarrhea (dark colored stool) for past three months. GI consulted and as stoll was heme-negative and no report of active or recent GI blood loss, recommended twice daily PPI but no further intervention at this time. EGD on 06/03/2022,that showed - Normal portions of the nasopharynx, oropharynx and larynx and esophagus. There was clotted blood in the gastric fundus and a single bleeding angioectasia in the stomach that was treated with argon plasma coagulation. CXR without active disease. Patient has an order for an OP MBS from 11/05/22 but per notes, scheduler was not able to get ahold of patient on phone to schedule.   Clinical Impression  Pt seen for skilled ST evaluation of swallow function via MBSS. Pt presents with a WFL swallow, with some signs of mild UES obstruction per CP bar. Per esophagram from 2022, the pt presented with a CP bar with no Zenkers Diverticulum present. The pt was administered thin liquid, nectar thick liquid, puree and solids. MBSImP protocol unable to be followed due to pt noncompliance- imaging limited due to this factor. SLP attempts to encourage further PO intake for progression of consistencies largely unsuccessful, pt reported "that sh- is nasty, I'm not having more". With thin liquid and nectar thick liquid, the pt had a timely bolus transfer with a mild CP bar observed. Pt's  puree and regular solids functionally WNL, with some delayed oral transit (suspected due to pt's noncompliance), some inefficient mastication with mild CP bar present and full esophageal sweeping. With regular solids, the pt had severe oral residue collected at front of the mouth. The pt reported "It's not going down" with little to no attempt to transfer bolus via swallow out of the oral cavity. When verbally cued to swallow remaining bolus, the pt said "I can't do it" with the pt spitting out the remaining bolus into a napkin. Pt presents with reduced bolus awareness and general safety awareness. Given the pt's edentulism and difficulty with mastication and oral transit, safest diet recs are mech soft (Dys 2)/ thin liquid with strict aspiration precautions (sit upright, small bites and sips, reduced rate of intake, limit distractions). Given pt's GI concerns (emesis and diarrhea with all meals) and decreased UES function, GI consult would be appropriate to further address pt's needs. SLP signing off.  Factors that may increase risk of adverse event in presence of aspiration Rubye Oaks & Clearance Coots 2021): Poor general health and/or compromised immunity  Swallow Evaluation Recommendations      Dione Housekeeper M.S. CCC-SLP

## 2022-12-25 NOTE — Progress Notes (Signed)
HD#1 SUBJECTIVE:  Patient Summary: Christina Byrd is a 57 y.o. with a PMH of alcohol use do, CKD stage 3b, GERD, Seizure do, angiodysplasia of the stomach and duodenum, duodenal ulcers, hematemesis and melena 2/2 to NSAID and alcohol use presenting with as an admission from clinic due to hypotension.  Overnight Events: mag repleted. H/H stable. GI signed off.   Interim History: Reports hematochezia. Has diarrhea worse with foods. Dysphagia worse to solids.  OBJECTIVE:  Vital Signs: Vitals:   12/25/22 0010 12/25/22 0320 12/25/22 0616 12/25/22 0832  BP: 108/78 92/62 (!) 105/57 109/64  Pulse: 100 (!) 106 100 97  Resp: 14 19 20 20   Temp: 98.8 F (37.1 C) 98.9 F (37.2 C) 99.4 F (37.4 C)   TempSrc: Oral Oral Oral Oral  SpO2: 100% 100% 95% 97%  Weight:  55.2 kg    Height:       Supplemental O2: Room Air SpO2: 97 %  Filed Weights   12/24/22 0653 12/25/22 0320  Weight: 52.3 kg 55.2 kg     Intake/Output Summary (Last 24 hours) at 12/25/2022 0908 Last data filed at 12/25/2022 0109 Gross per 24 hour  Intake 3673.94 ml  Output 300 ml  Net 3373.94 ml   Net IO Since Admission: 3,688.94 mL [12/25/22 0908]  Physical Exam: Physical Exam Constitutional:      Appearance: She is ill-appearing and toxic-appearing.  HENT:     Head: Atraumatic.     Mouth/Throat:     Mouth: Mucous membranes are dry.  Eyes:     General: Scleral icterus present.  Cardiovascular:     Rate and Rhythm: Normal rate and regular rhythm.     Heart sounds: No murmur heard.    No friction rub. No gallop.  Pulmonary:     Effort: No respiratory distress.     Breath sounds: No wheezing, rhonchi or rales.  Abdominal:     General: Abdomen is flat. There is no distension.     Palpations: Abdomen is soft.     Tenderness: There is abdominal tenderness in the suprapubic area and left lower quadrant. There is no guarding or rebound.  Musculoskeletal:        General: No swelling.     Right lower leg: No  edema.     Left lower leg: No edema.  Skin:    General: Skin is dry.     Capillary Refill: Capillary refill takes less than 2 seconds.     Coloration: Skin is not pale.     Findings: No rash.   FOBT negative  Patient Lines/Drains/Airways Status     Active Line/Drains/Airways     Name Placement date Placement time Site Days   Peripheral IV 12/23/22 22 G 1.75" Anterior;Left Forearm 12/23/22  1758  Forearm  1   Peripheral IV 12/24/22 22 G Anterior;Distal;Left Forearm 12/24/22  0200  Forearm  less than 1   Wound / Incision (Open or Dehisced) 12/23/22 Irritant Dermatitis (Moisture Associated Skin Damage) Other (Comment) Right;Left 12/23/22  2110  Other (Comment)  1             ASSESSMENT/PLAN:  Assessment: Principal Problem:   Nausea & vomiting Active Problems:   Dysphagia   Acute on chronic blood loss anemia   Alcohol related seizure (HCC)   Nausea and vomiting   Christina Byrd is a 57 y.o. with a PMH of alcohol use do, CKD stage 3b, GERD, Seizure do, angiodysplasia of the stomach and duodenum, duodenal ulcers, hematemesis  and melena 2/2 to NSAID and alcohol use presenting with as an admission from clinic due to hypotension.   Plan:  #Hypotension #Dysphagia #Diarrhea  #Nausea and Vomiting  Does not have adrenal insufficiency. Hypotension improved and patient is not difficult to arise anymore after 2 units of blood and fluid resuscitation yesterday. Has remained afebrile and WBC WNL. Had seven episodes of diarrhea yesterday, worsened after meals. Reports these being dark. Has been ongoing for about three months. Her dysphagia also started about three months ago. Not associated with nausea. It is worse with solids and endorsing choking when trying to eat solids. These are concerning for a bleed and potential malignancy or stricture given progressive symptoms and worse with solids. She does have heavy alcohol use. Appreciate PCCM and GI. Abdominal pain on LLQ may be  concerning for diverticulitis. Speech and Swallow will see her today. Will consult GI after this if needed. -SLP eval, consider GI later -CT abdomen pelvis -Continue to monitor vitals for signs of hypotension -GI panel collected -UA pending  -UDS pending -Strict I/Os -Daily weights -Repeat RFP in the afternoon -Tigan for nausea  #Acute on chronic Anemia  Likely related to a GI bleed with reported dark stools. S/p 2 units of blood. Hgb remains stable.  -continue to trend HH  #Alcohol use disorder  #Seizure do  Continue monitoring CIWA scores. Ativan ordered.  Cw Thiamine / Folic acid Cw Seizure protocol Cw Keppra   #AKI on CKD stage 3b  Improved today with a Cr 2.31 baseline is 0.9-1.3 a month ago. Renal US not concerning for hydronephrosis. Unable to obtain urine yesterday as she would remove the hat and also have a bowel movement each time. Does have suprapubic tenderness and has had diarrhea. Will monitor for now as she is getting fluids with electrolyte repletion.  -Repeat RFP this afternoon -UA pending  #Hypocalcemia  Had a prolonged Q improving ( today). S/p 3g of calcium gluconate yesterday. Remains hypocalcemic today. Ordered 2 additional grams of calcium gluconate.  -Repeat EKG after calcium gluconate  #Hypomagnesemia #Hypokalemia #Hypophosphatemia  Could be due to malnutrition, N/V and diarrhea. Replete as needed.   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 Should she have Hashimoto's I would expect her to have some constipation though. -FU TSH outpatient -t3 pending  #Intergluteal Cleft Wound  Continue to monitor for signs of infection. HIV and hepatitis panels are negative.  HSV swab pending  Gerhards butt cream  GC testing pending  #History of Gout - continue holding allopurinol at this time due to AKI  Best Practice: Diet: NPO IVF: Fluids: LR, Rate:  5000 cc bolus VTE: Place and maintain  sequential compression device Start: 12/23/22 2220 Code: Full DISPO: Anticipated discharge in 5 days  pending  medical work-up .  Signature: [facility]  Internal Medicine Resident, PGY-1 [facility]  Pager: [phone] 9:08 AM, 12/25/2022   Please contact the on call pager after 5 pm and on weekends at [phone].

## 2022-12-25 NOTE — Evaluation (Signed)
Clinical/Bedside Swallow Evaluation Patient Details  Name: Christina Byrd MRN: [id number] Date of Birth: [birth year]  Today's Date: 12/25/2022 Time: SLP Start Time (ACUTE ONLY): 0945 SLP Stop Time (ACUTE ONLY): 1000 SLP Time Calculation (min) (ACUTE ONLY): 15 min  Past Medical History:  Past Medical History:  Diagnosis Date   GASTROESOPHAGEAL REFLUX, NO ESOPHAGITIS 04/21/2006   Qualifier: Diagnosis of  By: Levada Schilling     GERD (gastroesophageal reflux disease) Dx 1995   HTN (hypertension)    Seizures (HCC)    Past Surgical History:  Past Surgical History:  Procedure Laterality Date   BIOPSY  06/03/2022   Procedure: BIOPSY;  Surgeon: Jenel Lucks, MD;  Location: [facility] ENDOSCOPY;  Service: Gastroenterology;;   ESOPHAGOGASTRODUODENOSCOPY (EGD) WITH PROPOFOL N/A 06/03/2022   Procedure: ESOPHAGOGASTRODUODENOSCOPY (EGD) WITH PROPOFOL;  Surgeon: Jenel Lucks, MD;  Location[facility] ENDOSCOPY;  Service: Gastroenterology;  Laterality: N/A;   HOT HEMOSTASIS N/A 06/03/2022   Procedure: HOT HEMOSTASIS (ARGON PLASMA COAGULATION/BICAP);  Surgeon: Jenel Lucks, MD;  Location: [facility] ENDOSCOPY;  Service: Gastroenterology;  Laterality: N/A;   HPI:  Patient is a 57 y.o. female with PMH: ETOH use disorder, CKD stage 3b, GERD, seizure disorder, angiodysplasia of the stomach and duodenum, duodenal ulcers, hematemesis and melena secondary to NSAID and ETOH use. She presented to the hospital on 12/23/2022 from clinic due to hypotension. Patient reported ongoing episodes of dysphagia, diarrhea (dark colored stool) for past three months. GI consulted and as stoll was heme-negative and no report of active or recent GI blood loss, recommended twice daily PPI but no further intervention at this time. EGD on 06/03/2022,that showed - Normal portions of the nasopharynx, oropharynx and larynx and esophagus. There was clotted blood in the gastric fundus and a single bleeding angioectasia in the stomach that was  treated with argon plasma coagulation. CXR without active disease. Patient has an order for an OP MBS from 11/05/22 but per notes, scheduler was not able to get ahold of patient on phone to schedule.    Assessment / Plan / Recommendation  Clinical Impression  Patient presents with suspected primary esophageal dysphagia but cannot r/o esophageal phase dysphagia. No overt s/s aspiration with sips of thin liquids and swallow initiation appeared timely. Patient did endorse past instances of globus sensation with mainly solids but "it can happen with liquids too." She told SLP that anything she consumes ends up resulting in nausea with vomiting and/or diarrhea. As patient has an order from her PCP dated 11/05/22 for modfied barium swallow study that was not scheduled (scheduler unable to get in contact with patient through her contact information), and patient is having dysphagia symptoms, will proceed with MBS. SLP Visit Diagnosis: Dysphagia, unspecified (R13.10)    Aspiration Risk  Mild aspiration risk    Diet Recommendation Other (Comment) (PO's as tolerated by patient, further recommendations to be made following MBS)    Medication Administration: Other (Comment) (as tolerated) Supervision: Patient able to self feed Compensations: Small sips/bites;Slow rate Postural Changes: Seated upright at 90 degrees    Other  Recommendations Recommended Consults: Consider esophageal assessment Oral Care Recommendations: Oral care BID    Recommendations for follow up therapy are one component of a multi-disciplinary discharge planning process, led by the attending physician.  Recommendations may be updated based on patient status, additional functional criteria and insurance authorization.  Follow up Recommendations Other (comment) (TBD)      Assistance Recommended at Discharge  TBD  Functional Status Assessment  TBD  Frequency and  Duration   TBD         Prognosis Prognosis for improved oropharyngeal  function: Good      Swallow Study   General Date of Onset: 12/23/22 HPI: Patient is a 57 y.o. female with PMH: ETOH use disorder, CKD stage 3b, GERD, seizure disorder, angiodysplasia of the stomach and duodenum, duodenal ulcers, hematemesis and melena secondary to NSAID and ETOH use. She presented to the hospital on 12/23/2022 from clinic due to hypotension. Patient reported ongoing episodes of dysphagia, diarrhea (dark colored stool) for past three months. GI consulted and as stoll was heme-negative and no report of active or recent GI blood loss, recommended twice daily PPI but no further intervention at this time. EGD on 06/03/2022,that showed - Normal portions of the nasopharynx, oropharynx and larynx and esophagus. There was clotted blood in the gastric fundus and a single bleeding angioectasia in the stomach that was treated with argon plasma coagulation. CXR without active disease. Patient has an order for an OP MBS from 11/05/22 but per notes, scheduler was not able to get ahold of patient on phone to schedule. Type of Study: Bedside Swallow Evaluation Previous Swallow Assessment: none found Diet Prior to this Study: Regular;Thin liquids (Level 0) Temperature Spikes Noted: No Respiratory Status: Room air History of Recent Intubation: No Behavior/Cognition: Alert;Pleasant mood;Cooperative Oral Cavity Assessment: Within Functional Limits Oral Care Completed by SLP: No Oral Cavity - Dentition: Edentulous Vision: Functional for self-feeding Self-Feeding Abilities: Able to feed self Patient Positioning: Upright in bed Baseline Vocal Quality: Normal Volitional Swallow: Able to elicit    Oral/Motor/Sensory Function Overall Oral Motor/Sensory Function: Within functional limits   Ice Chips     Thin Liquid Thin Liquid: Within functional limits Presentation: Straw;Self Fed    Nectar Thick     Honey Thick     Puree Puree: Not tested   Solid     Solid: Not tested     Angela Nevin,  MA, CCC-SLP Speech Therapy

## 2022-12-25 NOTE — Progress Notes (Signed)
Initial Nutrition Assessment  DOCUMENTATION CODES:   Not applicable  INTERVENTION:   -48 hour calorie count per MD -Continue regular diet -Continue Ensure Enlive po TID, each supplement provides 350 kcal and 20 grams of protein.  -Continue MVI with minerals daily -Continue 1 mg folic acid daily -Continue 161 mg thiamine daily  NUTRITION DIAGNOSIS:   Increased nutrient needs related to acute illness as evidenced by estimated needs.  GOAL:   Patient will meet greater than or equal to 90% of their needs  MONITOR:   PO intake, Supplement acceptance  REASON FOR ASSESSMENT:   Consult Assessment of nutrition requirement/status, Calorie Count  ASSESSMENT:   Pt with a PMH of alcohol use do, CKD stage 3b, GERD, Seizure do, angiodysplasia of the stomach and duodenum, duodenal ulcers, hematemesis and melena 2/2 to NSAID and alcohol use presenting due to hypotension.  Pt admitted with hypotension, dysphagia, diarrhea, and nausea and vomiting.   Reviewed I/O's: +3.4 L x 24 hours and +3.7 L since admission  UOP: 300 ml x 24 hours   Pt unavailable at time of visit. Attempted to speak with pt via call to hospital room phone, however, unable to reach. RD unable to obtain further nutrition-related history or complete nutrition-focused physical exam at this time.    Per SLP, no overt signs and aspiration with sips of thin liquids. Plan for MBSS today for further investigation and recommendations. Pt with history of ETOH abuse, consuming 1-2 beers daily and att least 2 shots daily.   Pt currently on a regular diet. No meal completion data available to assess at this time.   Reviewed wt hx; wt has been stable over the past 3 months.   Medications reviewed and include folic aciid, keppra, protonix, and thiamine.   Lab Results  Component Value Date   HGBA1C 5.0 07/11/2022   PTA DM medications are none.   Labs reviewed: K: 2.9, CBGS: 81 (inpatient orders for glycemic control are  none).    Diet Order:   Diet Order             Diet regular Room service appropriate? Yes; Fluid consistency: Thin  Diet effective now                   EDUCATION NEEDS:   No education needs have been identified at this time  Skin:  Skin Assessment: Skin Integrity Issues: Skin Integrity Issues:: Other (Comment) Other: IAD intergluteal folds  Last BM:  12/25/22 (type 7)  Height:   Ht Readings from Last 1 Encounters:  12/24/22 5\' 2"  (1.575 m)    Weight:   Wt Readings from Last 1 Encounters:  12/25/22 55.2 kg    Ideal Body Weight:  50 kg  BMI:  Body mass index is 22.24 kg/m.  Estimated Nutritional Needs:   Kcal:  1650-1850  Protein:  85-100 grams  Fluid:  > 1.6 L    Levada Schilling, RD, LDN, CDCES Registered Dietitian III Certified Diabetes Care and Education Specialist Please refer to [facility] for RD and/or RD on-call/weekend/after hours pager

## 2022-12-25 NOTE — Plan of Care (Signed)

## 2022-12-25 NOTE — Progress Notes (Signed)
Blood pressure is improved after transfusion, hemoglobin 8.6. Seen by GI. PCCM will be available as needed  Brette Cast V. Vassie Loll MD

## 2022-12-25 NOTE — Evaluation (Signed)
Physical Therapy Evaluation Patient Details Name: Christina Byrd MRN: [id number] DOB: 1965-06-16 Today's Date: 12/25/2022  History of Present Illness  57 yo female presents to [facility] on 10/31 from MD office for n/v/d, decreased intake, hypotension in setting of blood loss anemia with black tarry stools. Concern for diverticulitis. PMHx:  anemia, EtOH abuse, gout, R knee OA, HTN, GI bleed, CKD3, seizures, CKD stage 3b   Clinical Impression  Pt in bed upon arrival with husband present. Pt was agreeable to PT eval with encouragement from husband and education about the necessity of PT eval. Prior to admission, husband reports that the pt needed physical assistance to stand and ambulate short distances. Pt would hold onto husband for UE support. Husband also reports that the pt has needed assistance for all ADLs and iADLS. He reports decline with mobility and ADLs after the last hospital admission in 09/24. Pt was agreeable to PT performing MMT and standing for a brief moment before returning to bed. Pt was able to stand with supervision and no AD. Pt presents to therapy session with decreased LE strength, activity tolerance, and mobility. Pt has 24/7 support from husband and would benefit from HHPT upon discharge to work towards independence. Pt would benefit from acute skilled PT to address functional impairments. Acute PT to follow.          If plan is discharge home, recommend the following: A little help with walking and/or transfers;A little help with bathing/dressing/bathroom;Help with stairs or ramp for entrance   Can travel by private vehicle    Yes    Equipment Recommendations None recommended by PT (will continue to assess)  Recommendations for Other Services  OT consult    Functional Status Assessment Patient has had a recent decline in their functional status and demonstrates the ability to make significant improvements in function in a reasonable and predictable amount of time.      Precautions / Restrictions Precautions Precautions: Fall Restrictions Weight Bearing Restrictions: No      Mobility  Bed Mobility Overal bed mobility: Modified Independent  General bed mobility comments: modI w/ HOB elevated    Transfers Overall transfer level: Needs assistance Equipment used: None Transfers: Sit to/from Stand Sit to Stand: Supervision    General transfer comment: supervision for safety    Ambulation/Gait  General Gait Details: pt declined further mobility, agreeable to stand after max encouragement     Balance Overall balance assessment: Mild deficits observed, not formally tested         Pertinent Vitals/Pain Pain Assessment Pain Assessment: No/denies pain    Home Living Family/patient expects to be discharged to:: Private residence Living Arrangements: Spouse/significant other Available Help at Discharge: Family;Available 24 hours/day Type of Home: House Home Access: Stairs to enter Entrance Stairs-Rails: Right;Left;Can reach both Entrance Stairs-Number of Steps: 3-4   Home Layout: One level Home Equipment: Agricultural consultant (2 wheels);Shower seat;Crutches      Prior Function Prior Level of Function : Needs assist       Physical Assist : Mobility (physical);ADLs (physical) Mobility (physical): Gait;Transfers;Stairs;Bed mobility ADLs (physical): Grooming;Bathing;Dressing;Toileting;IADLs Mobility Comments: husband states he helps pt with transfers and ambulation. Reports pt holds onto him for support and has not been willing to use an AD ADLs Comments: husband helps with bathing, dressing, cooking, and cleaning     Extremity/Trunk Assessment   Upper Extremity Assessment Upper Extremity Assessment: Defer to OT evaluation    Lower Extremity Assessment Lower Extremity Assessment: Generalized weakness (grossly 4/5 MMT)  Cervical / Trunk Assessment Cervical / Trunk Assessment: Normal  Communication    Communication Communication: No apparent difficulties Cueing Techniques: Verbal cues  Cognition Arousal: Alert Behavior During Therapy: WFL for tasks assessed/performed Overall Cognitive Status: Difficult to assess       General Comments: pt became alert and engaged with husband encouragement. Husband answered most questions due to pt wanting to sleep        General Comments General comments (skin integrity, edema, etc.): VSS on RA     PT Assessment Patient needs continued PT services  PT Problem List Decreased strength;Decreased activity tolerance;Decreased balance;Decreased mobility       PT Treatment Interventions DME instruction;Gait training;Stair training;Functional mobility training;Therapeutic activities;Therapeutic exercise;Balance training;Patient/family education    PT Goals (Current goals can be found in the Care Plan section)  Acute Rehab PT Goals Patient Stated Goal: to go home PT Goal Formulation: With patient/family Time For Goal Achievement: 01/08/23 Potential to Achieve Goals: Good    Frequency Min 1X/week        AM-PAC PT "6 Clicks" Mobility  Outcome Measure Help needed turning from your back to your side while in a flat bed without using bedrails?: None Help needed moving from lying on your back to sitting on the side of a flat bed without using bedrails?: A Little Help needed moving to and from a bed to a chair (including a wheelchair)?: A Little Help needed standing up from a chair using your arms (e.g., wheelchair or bedside chair)?: A Little Help needed to walk in hospital room?: A Little Help needed climbing 3-5 steps with a railing? : A Lot 6 Click Score: 18    End of Session   Activity Tolerance: Patient limited by fatigue;Patient limited by lethargy Patient left: in bed;with call bell/phone within reach;with family/visitor present Nurse Communication: Mobility status PT Visit Diagnosis: Unsteadiness on feet (R26.81);Muscle weakness  (generalized) (M62.81)    Time: 2130-8657 PT Time Calculation (min) (ACUTE ONLY): 26 min   Charges:   PT Evaluation $PT Eval Low Complexity: 1 Low   PT General Charges $$ ACUTE PT VISIT: 1 Visit        Hilton Cork, PT, DPT Secure Chat Preferred  Rehab Office [phone]   Arturo Morton Brion Aliment 12/25/2022, 5:29 PM

## 2022-12-25 NOTE — Plan of Care (Signed)

## 2022-12-26 ENCOUNTER — Inpatient Hospital Stay (HOSPITAL_COMMUNITY): Payer: BLUE CROSS/BLUE SHIELD

## 2022-12-26 DIAGNOSIS — E876 Hypokalemia: Secondary | ICD-10-CM

## 2022-12-26 DIAGNOSIS — E878 Other disorders of electrolyte and fluid balance, not elsewhere classified: Secondary | ICD-10-CM | POA: Insufficient documentation

## 2022-12-26 DIAGNOSIS — R1310 Dysphagia, unspecified: Secondary | ICD-10-CM | POA: Diagnosis not present

## 2022-12-26 DIAGNOSIS — R112 Nausea with vomiting, unspecified: Secondary | ICD-10-CM | POA: Diagnosis not present

## 2022-12-26 LAB — CBC WITH DIFFERENTIAL/PLATELET
Abs Immature Granulocytes: 0.24 10*3/uL — ABNORMAL HIGH (ref 0.00–0.07)
Basophils Absolute: 0 10*3/uL (ref 0.0–0.1)
Basophils Relative: 1 %
Eosinophils Absolute: 0.1 10*3/uL (ref 0.0–0.5)
Eosinophils Relative: 1 %
HCT: 26.5 % — ABNORMAL LOW (ref 36.0–46.0)
Hemoglobin: 9.5 g/dL — ABNORMAL LOW (ref 12.0–15.0)
Immature Granulocytes: 4 %
Lymphocytes Relative: 29 %
Lymphs Abs: 1.6 10*3/uL (ref 0.7–4.0)
MCH: 32.1 pg (ref 26.0–34.0)
MCHC: 35.8 g/dL (ref 30.0–36.0)
MCV: 89.5 fL (ref 80.0–100.0)
Monocytes Absolute: 0.5 10*3/uL (ref 0.1–1.0)
Monocytes Relative: 9 %
Neutro Abs: 3.2 10*3/uL (ref 1.7–7.7)
Neutrophils Relative %: 56 %
Platelets: 152 10*3/uL (ref 150–400)
RBC: 2.96 MIL/uL — ABNORMAL LOW (ref 3.87–5.11)
RDW: 17.3 % — ABNORMAL HIGH (ref 11.5–15.5)
WBC: 5.6 10*3/uL (ref 4.0–10.5)
nRBC: 1.8 % — ABNORMAL HIGH (ref 0.0–0.2)

## 2022-12-26 LAB — COMPREHENSIVE METABOLIC PANEL
ALT: 73 U/L — ABNORMAL HIGH (ref 0–44)
AST: 187 U/L — ABNORMAL HIGH (ref 15–41)
Albumin: 1.7 g/dL — ABNORMAL LOW (ref 3.5–5.0)
Alkaline Phosphatase: 64 U/L (ref 38–126)
Anion gap: 13 (ref 5–15)
BUN: 15 mg/dL (ref 6–20)
CO2: 14 mmol/L — ABNORMAL LOW (ref 22–32)
Calcium: 5.8 mg/dL — CL (ref 8.9–10.3)
Chloride: 111 mmol/L (ref 98–111)
Creatinine, Ser: 1.58 mg/dL — ABNORMAL HIGH (ref 0.44–1.00)
GFR, Estimated: 38 mL/min — ABNORMAL LOW (ref >60)
Glucose, Bld: 154 mg/dL — ABNORMAL HIGH (ref 70–99)
Potassium: 3 mmol/L — ABNORMAL LOW (ref 3.5–5.1)
Sodium: 138 mmol/L (ref 135–145)
Total Bilirubin: 1 mg/dL (ref 0.3–1.2)
Total Protein: 4.2 g/dL — ABNORMAL LOW (ref 6.5–8.1)

## 2022-12-26 LAB — RENAL FUNCTION PANEL
Albumin: 2.1 g/dL — ABNORMAL LOW (ref 3.5–5.0)
Anion gap: 15 (ref 5–15)
BUN: 12 mg/dL (ref 6–20)
CO2: 16 mmol/L — ABNORMAL LOW (ref 22–32)
Calcium: 6.6 mg/dL — ABNORMAL LOW (ref 8.9–10.3)
Chloride: 111 mmol/L (ref 98–111)
Creatinine, Ser: 1.28 mg/dL — ABNORMAL HIGH (ref 0.44–1.00)
GFR, Estimated: 49 mL/min — ABNORMAL LOW (ref >60)
Glucose, Bld: 129 mg/dL — ABNORMAL HIGH (ref 70–99)
Phosphorus: 1.1 mg/dL — ABNORMAL LOW (ref 2.5–4.6)
Potassium: 4.3 mmol/L (ref 3.5–5.1)
Sodium: 142 mmol/L (ref 135–145)

## 2022-12-26 LAB — VITAMIN D 25 HYDROXY (VIT D DEFICIENCY, FRACTURES): Vit D, 25-Hydroxy: 14.04 ng/mL — ABNORMAL LOW (ref 30–100)

## 2022-12-26 LAB — HSV 1/2 PCR (SURFACE)
HSV-1 DNA: NOT DETECTED
HSV-2 DNA: NOT DETECTED

## 2022-12-26 LAB — T3: T3, Total: 79 ng/dL (ref 71–180)

## 2022-12-26 LAB — PHOSPHORUS: Phosphorus: <1 mg/dL — CL (ref 2.5–4.6)

## 2022-12-26 LAB — MAGNESIUM: Magnesium: 2.2 mg/dL (ref 1.7–2.4)

## 2022-12-26 MED ORDER — POTASSIUM CHLORIDE 10 MEQ/100ML IV SOLN
10.0000 meq | INTRAVENOUS | Status: DC
  Administered 2022-12-26: 10 meq via INTRAVENOUS
  Filled 2022-12-26 (×2): qty 100

## 2022-12-26 MED ORDER — CALCIUM GLUCONATE-NACL 1-0.675 GM/50ML-% IV SOLN
1.0000 g | Freq: Once | INTRAVENOUS | Status: AC
  Administered 2022-12-26: 1000 mg via INTRAVENOUS
  Filled 2022-12-26: qty 50

## 2022-12-26 MED ORDER — POTASSIUM CHLORIDE 10 MEQ/100ML IV SOLN
10.0000 meq | INTRAVENOUS | Status: DC
Start: 2022-12-26 — End: 2022-12-26
  Administered 2022-12-26 (×2): 10 meq via INTRAVENOUS
  Filled 2022-12-26: qty 100

## 2022-12-26 MED ORDER — VITAMIN D (ERGOCALCIFEROL) 1.25 MG (50000 UNIT) PO CAPS
50000.0000 [IU] | ORAL_CAPSULE | ORAL | Status: DC
  Filled 2022-12-26: qty 1

## 2022-12-26 MED ORDER — LOPERAMIDE HCL 2 MG PO CAPS
4.0000 mg | ORAL_CAPSULE | Freq: Once | ORAL | Status: AC
  Administered 2022-12-26: 4 mg via ORAL
  Filled 2022-12-26: qty 2

## 2022-12-26 MED ORDER — LOPERAMIDE HCL 2 MG PO CAPS: 2.0000 mg | ORAL_CAPSULE | ORAL | Status: DC | PRN

## 2022-12-26 MED ORDER — SODIUM PHOSPHATES 45 MMOLE/15ML IV SOLN
15.0000 mmol | Freq: Once | INTRAVENOUS | Status: AC
  Administered 2022-12-26: 15 mmol via INTRAVENOUS
  Filled 2022-12-26: qty 5

## 2022-12-26 MED ORDER — LOPERAMIDE HCL 2 MG PO CAPS
2.0000 mg | ORAL_CAPSULE | Freq: Once | ORAL | Status: AC
  Administered 2022-12-26: 2 mg via ORAL
  Filled 2022-12-26: qty 1

## 2022-12-26 MED ORDER — POTASSIUM CHLORIDE 20 MEQ PO PACK
20.0000 meq | PACK | ORAL | Status: AC
  Administered 2022-12-26 (×2): 20 meq via ORAL
  Filled 2022-12-26 (×2): qty 1

## 2022-12-26 MED ORDER — LOPERAMIDE HCL 2 MG PO CAPS
2.0000 mg | ORAL_CAPSULE | ORAL | Status: DC | PRN
  Administered 2022-12-27: 2 mg via ORAL
  Filled 2022-12-26: qty 1

## 2022-12-26 NOTE — Progress Notes (Signed)
HD#2 SUBJECTIVE:  Patient Summary: Christina Byrd is a 57 y.o. with a PMH of alcohol use do, CKD stage 3b, GERD, Seizure do, angiodysplasia of the stomach and duodenum, duodenal ulcers, hematemesis and melena 2/2 to NSAID and alcohol use presenting with as an admission from clinic due to hypotension.  Overnight Events: Refused CT and could not complete MBS exam due to pt non-compliance  Interim History: Potassium is burning her arm. She did not complete the ct because she gets claustrophobic and wantgs to be put to sleep if she will go in the machine.  OBJECTIVE:  Vital Signs: Vitals:   12/25/22 1657 12/25/22 2007 12/25/22 2311 12/26/22 0544  BP: 97/64 (!) 129/114 104/76 99/66  Pulse: 95 87 99 98  Resp: 17 15 20 20   Temp: 98.7 F (37.1 C) 98.9 F (37.2 C) 99.5 F (37.5 C) 98.5 F (36.9 C)  TempSrc: Oral Oral Oral Oral  SpO2: 94% 100% 98% 98%  Weight:    55.6 kg  Height:       Supplemental O2: Room Air SpO2: 98 %  Filed Weights   12/24/22 0653 12/25/22 0320 12/26/22 0544  Weight: 52.3 kg 55.2 kg 55.6 kg     Intake/Output Summary (Last 24 hours) at 12/26/2022 0849 Last data filed at 12/26/2022 0659 Gross per 24 hour  Intake 1025.2 ml  Output 650 ml  Net 375.2 ml   Net IO Since Admission: 4,064.14 mL [12/26/22 0849]  Physical Exam: Physical Exam Constitutional:      Appearance: She is ill-appearing and toxic-appearing.  HENT:     Head: Atraumatic.     Mouth/Throat:     Mouth: Mucous membranes are dry.  Eyes:     General: Scleral icterus present.  Cardiovascular:     Rate and Rhythm: Normal rate and regular rhythm.     Heart sounds: No murmur heard.    No friction rub. No gallop.  Pulmonary:     Effort: No respiratory distress.     Breath sounds: No wheezing, rhonchi or rales.  Abdominal:     General: Abdomen is flat. There is no distension.     Palpations: Abdomen is soft.     Tenderness: There is abdominal tenderness in the left lower quadrant. There  is no guarding or rebound.  Musculoskeletal:        General: No swelling.     Right lower leg: No edema.     Left lower leg: No edema.  Skin:    General: Skin is dry.     Capillary Refill: Capillary refill takes less than 2 seconds.     Coloration: Skin is not pale.     Findings: No rash.   FOBT negative  Patient Lines/Drains/Airways Status     Active Line/Drains/Airways     Name Placement date Placement time Site Days   Peripheral IV 12/23/22 22 G 1.75" Anterior;Left Forearm 12/23/22  1758  Forearm  1   Peripheral IV 12/24/22 22 G Anterior;Distal;Left Forearm 12/24/22  0200  Forearm  less than 1   Wound / Incision (Open or Dehisced) 12/23/22 Irritant Dermatitis (Moisture Associated Skin Damage) Other (Comment) Right;Left 12/23/22  2110  Other (Comment)  1             ASSESSMENT/PLAN:  Assessment: Principal Problem:   Nausea & vomiting Active Problems:   Dysphagia   Acute on chronic blood loss anemia   Alcohol related seizure (HCC)   Nausea and vomiting   Christina Byrd is  a 57 y.o. with a PMH of alcohol use do, CKD stage 3b, GERD, Seizure do, angiodysplasia of the stomach and duodenum, duodenal ulcers, hematemesis and melena 2/2 to NSAID and alcohol use presenting with as an admission from clinic due to hypotension.  Plan:  #Hypotension #Dysphagia #Nausea and Vomiting  Hypotension is improved from admission but still remains on the low end 99/66 MAP is 77. The etiology of her diarrhea and dysphagia is unknown. She refused the CT scan because she was too weak and tired to complete the exam yesterday, even though she drank most of the contrast media. Now stating that she would like to be completely asleep to get the scan due to claustrophobia. MBS exam unable to be completed because of patient non-compliance and little to no attempt to swallow. An esophageal assessment was recommended prior on her swallow evaluation. She did not have any overt symptoms with aspiration  with sips of thin liquids but did have globus sensation. Her dysphagia that has been worsening with solids and now liquids is concerning for a potential malignancy or stricture given her alcohol use as well as cocaine use. Appreciate SLP and Radiology for multiple attempts at getting the exam completed. Will consult GI again today as she needs an EGD for her dysphagia which seems to be more esophageal in nature than oral.  -CT scan canceled as patient is refusing study.   -Continue to monitor vitals for signs of hypotension -UDS pending -Strict I/Os -Daily weights -Repeat RFP in the afternoon -Tigan for nausea  #Diarrhea It would be considered a chronic diarrhea on 11/7 (6 weeks duration). Gi panel is negative, including C diff.  Inflammatory vs malabsorption, perhaps diverticulosis with her LLQ pain. Given that it is not infectious will add on loperamide to help with fluid and electrolyte loss. GI consulted and would appreciate recommendations.  -start loperamide  -Gi consulted  #Acute on chronic Anemia  Likely related to a GI bleed with reported dark stools. S/p 2 units of blood. Hgb remains stable.  -continue to trend HH  #Alcohol use disorder  #Seizure do  Continue monitoring CIWA scores. Dc'ing Ativan at time time. Can add on if needed. Cw Thiamine / Folic acid Cw Seizure protocol Cw Keppra   #AKI on CKD stage 3b  Improved today with a Cr 1.5 almost at baseline (0.9-1.3 a month ago). Renal US not concerning for hydronephrosis. Had some hemoglobinuria but no CVA tenderness or pain that would suggest that she is passing a stone. Continues to get IV electrolyte replacement which will her her with her fluid replacement. -Repeat RFP this afternoon  #Hypocalcemia  Remains hypocalcemic today. Will order calcium gluconate again.  -Repeat EKG after calcium gluconate  #Hypomagnesemia #Hypokalemia #Hypophosphatemia  Could be due to malnutrition, N/V and diarrhea. Replete as needed.    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 Should she have Hashimoto's I would expect her to have some constipation though. -FU TSH outpatient -t3 pending  #Intergluteal Cleft Wound  Continue to monitor for signs of infection. HIV and hepatitis panels are negative.  HSV swab pending  Cw Gerhards butt cream  GC testing pending  #History of Gout - continue holding allopurinol at this time due to AKI  Best Practice: Diet: NPO IVF: Fluids: LR, Rate:  5000 cc bolus VTE: Place and maintain sequential compression device Start: 12/23/22 2220 Code: Full DISPO: Anticipated discharge in 2 days  pending  medical work-up .  Signature: [facility]  Internal Medicine Resident, PGY-1 [facility]  Pager: [phone] 8:49 AM, 12/26/2022   Please contact the on call pager after 5 pm and on weekends at [phone].

## 2022-12-26 NOTE — Progress Notes (Signed)
Dr. Augustin Coupe notified me to change the patient from IV potassium to the oral packets. Patient actually tolerated them in orange juice. The patient has requested to have a soft diet but I notified her since she has refused to perform her barium swallow we can not advance until it is done. The patient has been refusing to eat due to the fact she doesn't not have soft food to eat but will eat a [place] ice. Will continue to monitor patient

## 2022-12-26 NOTE — Progress Notes (Signed)
I was asked to see the patient to address her diarrhea, dysphagia, nausea, and vomiting.  The patient was sound asleep when I arrived.  Her family members helped me to wake her up.  She was conversing with me, but she was still in a very sleepy state.  The patient states that her diarrhea is better with treatment.  The option of an EGD/colonoscopy were brought up, but she was noncommittal for the procedures.  It was also clear that she was not going to stay awake to drink the prep.  Her family states that she is the most awake in the late morning.  [facility] will reassess in the late morning tomorrow.

## 2022-12-26 NOTE — Evaluation (Signed)
Occupational Therapy Evaluation Patient Details Name: Christina Byrd MRN: [id number] DOB: [birth year] Today's Date: 12/26/2022   History of Present Illness 57 yo female presents to [facility] on 10/31 from MD office for n/v/d, decreased intake, hypotension in setting of blood loss anemia with black tarry stools. Concern for diverticulitis. PMHx:  anemia, EtOH abuse, gout, R knee OA, HTN, GI bleed, CKD3, seizures, CKD stage 3b   Clinical Impression   PTA, pt reports living at home with her husband who assisted her with ADL prn. She reports she was independent with functional mobility. However, she would use her husband as support when feeling unbalanced. Pt currently requires contact guard assistance for functional mobility without AD and contact guard assistance for LB dressing and for safety while standing for grooming. She demonstrates below baseline functioning secondary to decreased activity tolerance,  instability and generalized deconditioning. Pt would benefit from continued skilled OT services during admission and HHOT follow-up services to maximize overall return to prior level of independence. Will continue to follow acutely.       If plan is discharge home, recommend the following: A little help with walking and/or transfers;A little help with bathing/dressing/bathroom    Functional Status Assessment  Patient has had a recent decline in their functional status and demonstrates the ability to make significant improvements in function in a reasonable and predictable amount of time.  Equipment Recommendations  None recommended by OT    Recommendations for Other Services       Precautions / Restrictions Precautions Precautions: Fall Restrictions Weight Bearing Restrictions: No      Mobility Bed Mobility Overal bed mobility: Modified Independent             General bed mobility comments: modI w/ HOB elevated    Transfers Overall transfer level: Needs  assistance Equipment used: None Transfers: Sit to/from Stand Sit to Stand: Contact guard assist           General transfer comment: contact guard assistance for safety and line management      Balance Overall balance assessment: Mild deficits observed, not formally tested                                         ADL either performed or assessed with clinical judgement   ADL Overall ADL's : Needs assistance/impaired Eating/Feeding: Independent   Grooming: Contact guard assist;Standing   Upper Body Bathing: Set up;Sitting   Lower Body Bathing: Contact guard assist;Sit to/from stand   Upper Body Dressing : Set up;Sitting   Lower Body Dressing: Contact guard assist;Sit to/from stand   Toilet Transfer: Contact guard assist;Ambulation   Toileting- Clothing Manipulation and Hygiene: Contact guard assist;Sit to/from stand       Functional mobility during ADLs: Contact guard assist General ADL Comments: contact guard assistance pt with intermittent furniture walking. reports she typically holds her husband for support if feeling unbalanced.     Vision         Perception         Praxis         Pertinent Vitals/Pain Pain Assessment Pain Assessment: No/denies pain     Extremity/Trunk Assessment Upper Extremity Assessment Upper Extremity Assessment: Generalized weakness;RUE deficits/detail RUE Deficits / Details: reports hx of injury to R shoulder. FF AROM ~100* strength grossly 3+/5 bilaterally   Lower Extremity Assessment Lower Extremity Assessment: Generalized weakness   Cervical / Trunk  Assessment Cervical / Trunk Assessment: Normal   Communication Communication Communication: No apparent difficulties   Cognition Arousal: Alert Behavior During Therapy: WFL for tasks assessed/performed Overall Cognitive Status: Within Functional Limits for tasks assessed                                 General Comments: pt pleasant and  agreeable throughout session.     General Comments  vss on RA    Exercises     Shoulder Instructions      Home Living Family/patient expects to be discharged to:: Private residence Living Arrangements: Spouse/significant other Available Help at Discharge: Family;Available 24 hours/day Type of Home: House Home Access: Stairs to enter [employer] of Steps: 3-4 Entrance Stairs-Rails: Right;Left;Can reach both Home Layout: One level     Bathroom Shower/Tub: Chief Strategy Officer: Standard Bathroom Accessibility: Yes   Home Equipment: Agricultural consultant (2 wheels);Shower seat;Crutches   Additional Comments: states she can get RW easily      Prior Functioning/Environment Prior Level of Function : Needs assist       Physical Assist : Mobility (physical);ADLs (physical) Mobility (physical): Gait;Transfers;Stairs;Bed mobility ADLs (physical): Grooming;Bathing;Dressing;Toileting;IADLs Mobility Comments: husband states he helps pt with transfers and ambulation. Reports pt holds onto him for support and has not been willing to use an AD ADLs Comments: husband helps with bathing, dressing, cooking, and cleaning        OT Problem List: Decreased activity tolerance;Impaired balance (sitting and/or standing)      OT Treatment/Interventions: Self-care/ADL training;Energy conservation;DME and/or AE instruction;Patient/family education;Balance training    OT Goals(Current goals can be found in the care plan section) Acute Rehab OT Goals Patient Stated Goal: to walk in the hall this afternoon OT Goal Formulation: With patient Time For Goal Achievement: 01/09/23 Potential to Achieve Goals: Good ADL Goals Pt Will Perform Grooming: with modified independence Pt Will Perform Lower Body Dressing: with modified independence;sit to/from stand Pt Will Transfer to Toilet: with modified independence;ambulating  OT Frequency: Min 2X/week    Co-evaluation               AM-PAC OT "6 Clicks" Daily Activity     Outcome Measure Help from another person eating meals?: None Help from another person taking care of personal grooming?: A Little Help from another person toileting, which includes using toliet, bedpan, or urinal?: A Little Help from another person bathing (including washing, rinsing, drying)?: A Little Help from another person to put on and taking off regular upper body clothing?: None Help from another person to put on and taking off regular lower body clothing?: A Little 6 Click Score: 20   End of Session Nurse Communication: Mobility status  Activity Tolerance: Patient tolerated treatment well Patient left: in bed;with call bell/phone within reach  OT Visit Diagnosis: Other abnormalities of gait and mobility (R26.89);Muscle weakness (generalized) (M62.81)                Time: 1003-1020 OT Time Calculation (min): 17 min Charges:  OT General Charges $OT Visit: 1 Visit OT Evaluation $OT Eval Low Complexity: 1 Low  Camilla Skeen OTR/L Acute Rehabilitation Services Office: [phone]   [place] Crosby 12/26/2022, 11:08 AM

## 2022-12-27 ENCOUNTER — Inpatient Hospital Stay (HOSPITAL_COMMUNITY): Payer: BLUE CROSS/BLUE SHIELD

## 2022-12-27 DIAGNOSIS — K7030 Alcoholic cirrhosis of liver without ascites: Secondary | ICD-10-CM

## 2022-12-27 DIAGNOSIS — K7200 Acute and subacute hepatic failure without coma: Principal | ICD-10-CM

## 2022-12-27 DIAGNOSIS — R7401 Elevation of levels of liver transaminase levels: Secondary | ICD-10-CM | POA: Diagnosis not present

## 2022-12-27 DIAGNOSIS — E440 Moderate protein-calorie malnutrition: Secondary | ICD-10-CM | POA: Insufficient documentation

## 2022-12-27 DIAGNOSIS — I959 Hypotension, unspecified: Secondary | ICD-10-CM | POA: Diagnosis not present

## 2022-12-27 DIAGNOSIS — R112 Nausea with vomiting, unspecified: Secondary | ICD-10-CM | POA: Diagnosis not present

## 2022-12-27 DIAGNOSIS — K529 Noninfective gastroenteritis and colitis, unspecified: Secondary | ICD-10-CM

## 2022-12-27 DIAGNOSIS — R1319 Other dysphagia: Secondary | ICD-10-CM

## 2022-12-27 DIAGNOSIS — E46 Unspecified protein-calorie malnutrition: Secondary | ICD-10-CM | POA: Insufficient documentation

## 2022-12-27 DIAGNOSIS — E43 Unspecified severe protein-calorie malnutrition: Secondary | ICD-10-CM | POA: Insufficient documentation

## 2022-12-27 DIAGNOSIS — R110 Nausea: Secondary | ICD-10-CM

## 2022-12-27 DIAGNOSIS — K7460 Unspecified cirrhosis of liver: Secondary | ICD-10-CM | POA: Insufficient documentation

## 2022-12-27 DIAGNOSIS — R1310 Dysphagia, unspecified: Secondary | ICD-10-CM | POA: Diagnosis not present

## 2022-12-27 DIAGNOSIS — Z789 Other specified health status: Secondary | ICD-10-CM

## 2022-12-27 LAB — CBC WITH DIFFERENTIAL/PLATELET
Abs Immature Granulocytes: 0.3 10*3/uL — ABNORMAL HIGH (ref 0.00–0.07)
Basophils Absolute: 0.1 10*3/uL (ref 0.0–0.1)
Basophils Relative: 1 %
Eosinophils Absolute: 0.1 10*3/uL (ref 0.0–0.5)
Eosinophils Relative: 2 %
HCT: 24.3 % — ABNORMAL LOW (ref 36.0–46.0)
Hemoglobin: 8.3 g/dL — ABNORMAL LOW (ref 12.0–15.0)
Immature Granulocytes: 6 %
Lymphocytes Relative: 26 %
Lymphs Abs: 1.3 10*3/uL (ref 0.7–4.0)
MCH: 31.4 pg (ref 26.0–34.0)
MCHC: 34.2 g/dL (ref 30.0–36.0)
MCV: 92 fL (ref 80.0–100.0)
Monocytes Absolute: 0.7 10*3/uL (ref 0.1–1.0)
Monocytes Relative: 15 %
Neutro Abs: 2.6 10*3/uL (ref 1.7–7.7)
Neutrophils Relative %: 50 %
Platelets: 141 10*3/uL — ABNORMAL LOW (ref 150–400)
RBC: 2.64 MIL/uL — ABNORMAL LOW (ref 3.87–5.11)
RDW: 17.7 % — ABNORMAL HIGH (ref 11.5–15.5)
WBC: 5 10*3/uL (ref 4.0–10.5)
nRBC: 1.8 % — ABNORMAL HIGH (ref 0.0–0.2)

## 2022-12-27 LAB — COMPREHENSIVE METABOLIC PANEL
ALT: 170 U/L — ABNORMAL HIGH (ref 0–44)
AST: 863 U/L — ABNORMAL HIGH (ref 15–41)
Albumin: 1.8 g/dL — ABNORMAL LOW (ref 3.5–5.0)
Alkaline Phosphatase: 69 U/L (ref 38–126)
Anion gap: 7 (ref 5–15)
BUN: 9 mg/dL (ref 6–20)
CO2: 17 mmol/L — ABNORMAL LOW (ref 22–32)
Calcium: 6 mg/dL — CL (ref 8.9–10.3)
Chloride: 115 mmol/L — ABNORMAL HIGH (ref 98–111)
Creatinine, Ser: 1.19 mg/dL — ABNORMAL HIGH (ref 0.44–1.00)
GFR, Estimated: 53 mL/min — ABNORMAL LOW (ref >60)
Glucose, Bld: 84 mg/dL (ref 70–99)
Potassium: 3.8 mmol/L (ref 3.5–5.1)
Sodium: 139 mmol/L (ref 135–145)
Total Bilirubin: 1 mg/dL (ref <1.2)
Total Protein: 4.5 g/dL — ABNORMAL LOW (ref 6.5–8.1)

## 2022-12-27 LAB — AEROBIC CULTURE W GRAM STAIN (SUPERFICIAL SPECIMEN): Special Requests: NORMAL

## 2022-12-27 LAB — GC/CHLAMYDIA PROBE AMP (~~LOC~~) NOT AT ARMC
Chlamydia: NEGATIVE
Comment: NEGATIVE
Comment: NORMAL
Neisseria Gonorrhea: NEGATIVE

## 2022-12-27 LAB — ETHANOL: Alcohol, Ethyl (B): <10 mg/dL (ref <10)

## 2022-12-27 LAB — CK: Total CK: 217 U/L (ref 38–234)

## 2022-12-27 MED ORDER — POTASSIUM PHOSPHATES 15 MMOLE/5ML IV SOLN
30.0000 mmol | Freq: Once | INTRAVENOUS | Status: DC
  Filled 2022-12-27 (×2): qty 10

## 2022-12-27 MED ORDER — CALCIUM GLUCONATE-NACL 1-0.675 GM/50ML-% IV SOLN
1.0000 g | Freq: Once | INTRAVENOUS | Status: AC
  Administered 2022-12-27: 1000 mg via INTRAVENOUS
  Filled 2022-12-27 (×2): qty 50

## 2022-12-27 MED ORDER — WHITE PETROLATUM EX OINT: TOPICAL_OINTMENT | Freq: Three times a day (TID) | CUTANEOUS | Status: DC

## 2022-12-27 MED ORDER — K PHOS MONO-SOD PHOS DI & MONO 155-852-130 MG PO TABS
250.0000 mg | ORAL_TABLET | Freq: Three times a day (TID) | ORAL | Status: AC
  Administered 2022-12-27 – 2022-12-29 (×5): 250 mg via ORAL
  Filled 2022-12-27 (×6): qty 1

## 2022-12-27 MED ORDER — PANCRELIPASE (LIP-PROT-AMYL) 12000-38000 UNITS PO CPEP
72000.0000 [IU] | ORAL_CAPSULE | Freq: Three times a day (TID) | ORAL | Status: DC
  Administered 2022-12-28 – 2022-12-29 (×3): 72000 [IU] via ORAL
  Filled 2022-12-27 (×4): qty 6

## 2022-12-27 MED ORDER — WHITE PETROLATUM EX OINT
TOPICAL_OINTMENT | Freq: Four times a day (QID) | CUTANEOUS | Status: DC
  Filled 2022-12-27: qty 28.35

## 2022-12-27 NOTE — Progress Notes (Addendum)
Nutrition Follow-up  DOCUMENTATION CODES:   Non-severe (moderate) malnutrition in context of social or environmental circumstances  INTERVENTION:   -Re-order 48 hr Calorie, current order for calorie count 11/2-11/3 not completed.   -Continue Ensure Enlive po BID, each supplement provides 350 kcal and 20 grams of protein.  -Continue Vitamin B1/Folic acid/MVI/Minerals, vitamin D for micronutrient support due to alcohol hx.   NUTRITION DIAGNOSIS:   Moderate Malnutrition related to social / environmental circumstances, inability to eat, diarrhea, altered GI function, dysphagia as evidenced by mild muscle depletion, moderate muscle depletion, energy intake < or equal to 50% for > or equal to 1 month, per patient/family report.    GOAL:   Patient will meet greater than or equal to 90% of their needs   MONITOR:   PO intake, Weight trends, Supplement acceptance, Skin, I & O's, Labs  REASON FOR ASSESSMENT:   Follow up and re-order Calorie Count  ASSESSMENT:   Pt with a PMH of alcohol use do, CKD stage 3b, GERD, Seizure do, angiodysplasia of the stomach and duodenum, duodenal ulcers, hematemesis and melena 2/2 to NSAID and alcohol use presenting due to hypotension.  11/2-MBSS, unabe to be completed in entirety due to patient non-compliance. SLP recommends dysphagia II.  11/3-GI consult 11/4-possible repeat MBSS  Patient informs she has been eating poorly for about the past six months, consuming well less than half of usual due to reported nausea, vomiting, diarrhea and alcohol excess.  She verbalizes intention to stop drinking. Discussed nutrition recommendations while going through sobriety. To provide written education materials on discharge paperwork.  Per review of EMR, weights have been gradually trending down since 2021.  Edentulous, on dysphagia II diet. Currently taking some Ensure, sprite, ginger ale, gram crackers, noted vomiting episode after the oral supplement.    Medications reviewed and include folic acid 1 mg daily, MVI/Minerals-1 Tab daily, PPI, phosphorus 250 mg 3x daily, thiamine 100 mg daily, Vitamin D 1.25 mg weekly, potassium phosphate 30 mmol once IV-patient refused, PRN medications imodium 2 mg.  Labs: creatinine 1.19, calcium 6.0, GFR 53  NUTRITION - FOCUSED PHYSICAL EXAM:  Flowsheet Row Most Recent Value  Orbital Region No depletion  Upper Arm Region No depletion  Thoracic and Lumbar Region No depletion  Buccal Region No depletion  Temple Region Mild depletion  Clavicle Bone Region No depletion  Clavicle and Acromion Bone Region No depletion  Scapular Bone Region No depletion  Dorsal Hand No depletion  Patellar Region Mild depletion  Anterior Thigh Region Mild depletion  Posterior Calf Region Moderate depletion  Edema (RD Assessment) None  Hair Reviewed  Eyes Reviewed  Mouth Reviewed  Skin Reviewed  Nails Reviewed       Diet Order:   Diet Order             DIET DYS 2 Room service appropriate? Yes with Assist; Fluid consistency: Thin  Diet effective now                   EDUCATION NEEDS:   Education needs have been addressed  Skin:  Skin Assessment: Skin Integrity Issues: Skin Integrity Issues:: Other (Comment) Other: moisture related skin damage intergluteal folds  Last BM:  11/3, type 6-small  Height:   Ht Readings from Last 1 Encounters:  12/24/22 5\' 2"  (1.575 m)    Weight:   Wt Readings from Last 1 Encounters:  12/26/22 55.6 kg    Ideal Body Weight:  50 kg  BMI:  Body mass index is  22.41 kg/m.  Estimated Nutritional Needs:   Kcal:  1650-1850  Protein:  85-100 grams  Fluid:  > 1.6 L    Alvino Chapel, RDLD Clinical Dietitian See AMION for contact information

## 2022-12-27 NOTE — Progress Notes (Addendum)
HD#3 SUBJECTIVE:  Patient Summary: Christina Byrd is a 57 y.o. with a PMH of alcohol use do, CKD stage 3b, GERD, Seizure do, angiodysplasia of the stomach and duodenum, duodenal ulcers, hematemesis and melena 2/2 to NSAID and alcohol use presenting with as an admission from clinic due to hypotension.  Overnight Events:NAEO  Interim History: Continues to not want to have a CT scan today. Slept better, diarrhea is improving. Has abdominal pain.   OBJECTIVE:  Vital Signs: Vitals:   12/26/22 1515 12/26/22 2033 12/27/22 0357 12/27/22 0732  BP: 110/69 108/72 103/73 105/79  Pulse: 97   85  Resp: 17 16 20 19   Temp:  98.4 F (36.9 C) 98.6 F (37 C) 98 F (36.7 C)  TempSrc: Oral Oral Oral Oral  SpO2: 98%   100%  Weight:      Height:       Supplemental O2: Room Air SpO2: 100 %  Filed Weights   12/24/22 0653 12/25/22 0320 12/26/22 0544  Weight: 52.3 kg 55.2 kg 55.6 kg     Intake/Output Summary (Last 24 hours) at 12/27/2022 1055 Last data filed at 12/26/2022 1230 Gross per 24 hour  Intake 120 ml  Output --  Net 120 ml   Net IO Since Admission: 4,184.14 mL [12/27/22 1055]  Physical Exam: Physical Exam Constitutional:      Appearance: She is ill-appearing. She is not toxic-appearing.  HENT:     Head: Atraumatic.     Mouth/Throat:     Mouth: Mucous membranes are dry.  Eyes:     General: Scleral icterus present.  Cardiovascular:     Rate and Rhythm: Normal rate and regular rhythm.     Heart sounds: No murmur heard.    No friction rub. No gallop.  Pulmonary:     Effort: No respiratory distress.     Breath sounds: Examination of the right-middle field reveals rales. Examination of the left-middle field reveals rales. Examination of the right-lower field reveals rales. Examination of the left-lower field reveals rales. Rales present. No wheezing or rhonchi.  Abdominal:     General: Abdomen is flat. There is no distension.     Palpations: Abdomen is soft.      Tenderness: There is abdominal tenderness in the left lower quadrant. There is no guarding or rebound.  Musculoskeletal:        General: No swelling.     Right lower leg: No edema.     Left lower leg: No edema.  Skin:    General: Skin is dry.     Capillary Refill: Capillary refill takes less than 2 seconds.     Coloration: Skin is not pale.     Findings: No rash.   FOBT negative  Patient Lines/Drains/Airways Status     Active Line/Drains/Airways     Name Placement date Placement time Site Days   Peripheral IV 12/23/22 22 G 1.75" Anterior;Left Forearm 12/23/22  1758  Forearm  1   Peripheral IV 12/24/22 22 G Anterior;Distal;Left Forearm 12/24/22  0200  Forearm  less than 1   Wound / Incision (Open or Dehisced) 12/23/22 Irritant Dermatitis (Moisture Associated Skin Damage) Other (Comment) Right;Left 12/23/22  2110  Other (Comment)  1             ASSESSMENT/PLAN:  Assessment: Principal Problem:   Dysphagia Active Problems:   Acute on chronic blood loss anemia   Alcohol related seizure (HCC)   Nausea & vomiting   Nausea and vomiting   Electrolyte  abnormality   Christina Byrd is a 57 y.o. with a PMH of alcohol use do, CKD stage 3b, GERD, Seizure do, angiodysplasia of the stomach and duodenum, duodenal ulcers, hematemesis and melena 2/2 to NSAID and alcohol use presenting with as an admission from clinic due to hypotension.  Plan:  #Transaminitis Unknown etiology. Alk phos is WNL and lipase at admission was not 3xULN. She also does not display sxs concerning for a pancreatitis. Could be from prolonged diarrhea and inability to eat. Was only started on loperamide yesterday. Hep panel at admission is negative. Refuses CT scan x 4. Would appreciate GI's assistance with this. Patient was too weak yesterday, GI to see her today.  -RUQ scan -FU GI recs  -Ck, CRP -Could consider anti-mitrochondrial antibodies but abrupt onset makes autoimmune disease unlikely.  -UDS  -Ethanol  level  #Hypotension #Dysphagia #Nausea and Vomiting  Bps have remained soft but she is stable. Diarrhea is improving with loperamide. Will reach out to SLP to see if MBS can be attempted today. GI to see her today.  -Will reach out to SLP again -Continue to monitor vitals for signs of hypotension -UDS pending -Strict I/Os -Daily weights -Tigan for nausea  #Diarrhea It would be considered a chronic diarrhea on 11/7 (6 weeks duration). Gi panel is negative, including C diff.  Inflammatory vs malabsorption, perhaps diverticulosis with her LLQ pain. Loperamide was added tyesterday.  -FU GI recs -cw loperamide   #Acute on chronic Anemia  Likely related to a GI bleed with reported dark stools. S/p 2 units of blood. Hgb has dropped one point today. Given her fluid loss, this is unlikely to be related to a dilutional effect. May need a colonoscopy.  -continue to trend HH -FU GI recs  #Alcohol use disorder  #Seizure do  Continue monitoring CIWA scores. Dc'ing Ativan at time time. Can add on if needed. Cw Thiamine / Folic acid Cw Seizure protocol Cw Keppra   #AKI on CKD stage 3b  Cr has continued to improve and it is 1.19 today  (0.9-1.3 a month ago). Continues to get IV electrolyte replacement which will her her with her fluid replacement. -continue monitoring Cr.   #Hypocalcemia  Remains hypocalcemic today but improved from yesterday. Likely due to loperamide helping with diarrhea. Will order calcium gluconate again.  -Repeat EKG after calcium gluconate  #Hypomagnesemia #Hypokalemia #Hypophosphatemia  Could be due to malnutrition, N/V and diarrhea. Replete as needed.   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 Should she have Hashimoto's I would expect her to have some constipation though. -FU TSH outpatient -t3 pending  #Intergluteal Cleft Wound  Continue to monitor for signs of infection. HIV and hepatitis  panels are negative. HSV negative. This is likely related to irritation 2/2 diarrhea. Not an active ulcer at this point but barrier ointment should help prevent wound from progressing.Cw Gerhards butt cream   #History of Gout - continue holding allopurinol at this time due to AKI  Best Practice: Diet: NPO IVF: none today VTE: Place and maintain sequential compression device Start: 12/23/22 2220 Code: Full DISPO: Anticipated discharge in 2 days  pending  medical work-up .  Signature: [facility]  Internal Medicine Resident, PGY-1 [facility]  Pager: [phone] 10:55 AM, 12/27/2022   Please contact the on call pager after 5 pm and on weekends at [phone].

## 2022-12-27 NOTE — Progress Notes (Addendum)
Progress Note  Primary GI: Unassigned  LOS: 3 days   Chief Complaint: Nausea, vomiting, diarrhea, dysphagia, elevated LFTs   Subjective   Family is in the room at the time of my evaluation Patient reports diarrhea ongoing for the last 3 months as well as intermittent nausea and vomiting.  Nausea and vomiting mainly occurs in the morning after eating.  States she can even tolerate 2 graham crackers.  She also feels like there is something constantly in her throat.  It happens with both liquids and solids and does not go away.  Denies melena/hematochezia.   Objective   Vital signs in last 24 hours: Temp:  [97.6 F (36.4 C)-98.6 F (37 C)] 97.6 F (36.4 C) (11/04 1553) Pulse Rate:  [85-88] 88 (11/04 1553) Resp:  [16-20] 20 (11/04 1553) BP: (103-118)/(72-84) 118/84 (11/04 1553) SpO2:  [93 %-100 %] 100 % (11/04 1553) Last BM Date : 12/25/22 Last BM recorded by nurses in past 5 days Stool Type: Type 6 (Mushy consistency with ragged edges) (12/26/2022  3:00 AM)  General:   female in no acute distress  Heart:  Regular rate and rhythm; no murmurs Pulm: Clear anteriorly; no wheezing Abdomen: soft, nondistended, normal bowel sounds in all quadrants. Nontender without guarding. No organomegaly appreciated. Extremities:  No edema Neurologic:  Alert and  oriented x4;  No focal deficits.  Psych:  Cooperative. Normal mood and affect.  Intake/Output from previous day: 11/03 0701 - 11/04 0700 In: 120 [P.O.:120] Out: -  Intake/Output this shift: Total I/O In: 290 [P.O.:240; IV Piggyback:50] Out: -   Studies/Results: US Abdomen Limited RUQ (LIVER/GB)  Result Date: 12/27/2022 CLINICAL DATA:  Elevated liver function tests. EXAM: ULTRASOUND ABDOMEN LIMITED RIGHT UPPER QUADRANT COMPARISON:  July 26, 2022. FINDINGS: Gallbladder: 1 cm gallstone is noted. Minimal gallbladder wall thickening is noted at approximately 4 mm. No sonographic Murphy's sign. Common bile duct: Diameter: 3 mm which is  within normal limits. Liver: Minimally increased echogenicity of hepatic parenchyma is noted suggesting hepatic steatosis. Portal vein is patent on color Doppler imaging with normal direction of blood flow towards the liver. Other: Minimal ascites. IMPRESSION: Cholelithiasis is noted with minimal gallbladder wall thickening, but no sonographic Murphy's sign. If there is clinical concern for cholecystitis, HIDA scan is recommended for further evaluation. Possible hepatic steatosis. Electronically Signed   By: Lupita Raider M.D.   On: 12/27/2022 12:14    Lab Results: Recent Labs    12/25/22 0712 12/26/22 0901 12/27/22 0442  WBC 6.7 5.6 5.0  HGB 8.6* 9.5* 8.3*  HCT 24.5* 26.5* 24.3*  PLT 152 152 141*   BMET Recent Labs    12/26/22 0408 12/26/22 1849 12/27/22 0442  NA 138 142 139  K 3.0* 4.3 3.8  CL 111 111 115*  CO2 14* 16* 17*  GLUCOSE 154* 129* 84  BUN 15 12 9   CREATININE 1.58* 1.28* 1.19*  CALCIUM 5.8* 6.6* 6.0*   LFT Recent Labs    12/27/22 0442  PROT 4.5*  ALBUMIN 1.8*  AST 863*  ALT 170*  ALKPHOS 69  BILITOT 1.0   PT/INR No results for input(s): "LABPROT", "INR" in the last 72 hours.   Scheduled Meds:  atorvastatin  20 mg Oral Daily   feeding supplement  237 mL Oral TID BM   folic acid  1 mg Oral Daily   levETIRAcetam  500 mg Oral BID   multivitamin with minerals  1 tablet Oral Daily   pantoprazole (PROTONIX) IV  40  mg Intravenous Q12H   phosphorus  250 mg Oral TID   thiamine  100 mg Oral Daily   Or   thiamine  100 mg Intravenous Daily   Vitamin D (Ergocalciferol)  50,000 Units Oral Q7 days   white petrolatum   Topical Q6H   Continuous Infusions:  levETIRAcetam Stopped (12/25/22 2105)   potassium PHOSPHATE IVPB (in mmol)        Patient profile:   57 year old female history of chronic EtOH abuse, seizure disorder, prior GI bleed felt secondary to gastric AVM with EGD 05/2022 revealing 1 gastric AVM treated with APC and nonbleeding duodenal bulb  ulcer secondary to NSAID use presenting for elevated LFTs, nausea, vomiting, diarrhea, and dysphagia    Impression:   Dysphagia Nausea/vomiting Both solids and liquids MBS protocol unable to be followed due to patient noncompliance -May need to consider EGD for further evaluation. Also possible globus sensation.   -- Although suspect nausea/vomiting could be related to symptomatic cholelithiasis/cholecystitis per RUQ ultrasound - HIDA scan  Diarrhea Ongoing for 3 months with negative GI pathogen panel and C. difficile.  No history of colonoscopy.  Diarrhea is improving with loperamide.  May need to do colonoscopy to rule out microscopic colitis - Continue loperamide - If doing colonoscopy will evaluate with biopsies for microscopic colitis  Elevated LFTs AST 863/ALT 170 T. bili 1.0 Ethanol normal No new medications.  Unknown etiology.  Typically AST being much higher than ALT is indicative of alcohol use though patient has not been using alcohol while admitted.  Hepatitis panel is negative.  Patient refuses CT scan x 4.  RUQ ultrasound shows cholelithiasis with gallbladder wall thickening and recommends HIDA.  Her nausea and vomiting with eating could be related to cholelithiasis and gallbladder wall thickening, although her LFTs are not an obstructive pattern. - Recommend HIDA scan - Continue to trend LFTs  Acute on chronic anemia S/p 2 units PRBCs, hemoglobin is dropped 1 point today.  Possible dilution versus GI bleed although has had negative Hemoccult.  No previous history of colonoscopy - Persistent acute on chronic anemia with history of duodenal ulcer April 2024.  May need to do EGD/colonoscopy for further evaluation  Seizure disorder On Keppra   Bayley M McMichael  12/27/2022, 3:59 PM   Attending physician's note   I have taken a history, reviewed the chart and examined the patient. I performed a substantive portion of this encounter, including complete performance of at  least one of the key components, in conjunction with the APP. I agree with the APP's note, impression and recommendations.    Patient complains of diarrhea, nausea with decreased appetite and dysphagia. Anemia with heme positive stool in the past, most recent fecal Hemoccult was negative Denies any overt bleeding with melena or blood in stool  Negative stool GI pathogen panel and C. Difficile  Based on labs, imaging and exam concerning for cirrhosis MELD 3.0: 15 at 07/13/2022  4:03 AM Ongoing EtOH use  Patient is declining endoscopic evaluation, she does not want to undergo any procedures  Start Creon for possible pancreatic insufficiency, 72,000 units 3 times daily with meals Small frequent meals with high-protein diet  Elevated transaminases in the setting of ongoing EtOH use, ?  Shock liver Continue to monitor LFT Negative for hepatitis panel  The patient was provided an opportunity to ask questions and all were answered. The patient agreed with the plan and demonstrated an understanding of the instructions.   Iona Beard , MD [phone]

## 2022-12-27 NOTE — Progress Notes (Signed)
Physical Therapy Treatment Patient Details Name: Christina Byrd MRN: [id number] DOB: [birth year] Today's Date: 12/27/2022   History of Present Illness 57 yo female presents to [facility] on 10/31 from MD office for n/v/d, decreased intake, hypotension in setting of blood loss anemia with black tarry stools. Concern for diverticulitis. PMHx:  anemia, EtOH abuse, gout, R knee OA, HTN, GI bleed, CKD3, seizures, CKD stage 3b    PT Comments  Pt reluctant to start and complete treatment session. Pt appears close to baseline, would benefit from use of AD to decrease fall risk when ambulating. Pt declined transferring to chair and stairs to assess goal progress, has met an acceptable distance for ambulation but would need to trial AD to further determine status. Will continue to follow acutely.    If plan is discharge home, recommend the following: A little help with walking and/or transfers;A little help with bathing/dressing/bathroom;Help with stairs or ramp for entrance   Can travel by private vehicle        Equipment Recommendations  None recommended by PT    Recommendations for Other Services       Precautions / Restrictions Precautions Precautions: Fall Restrictions Weight Bearing Restrictions: No     Mobility  Bed Mobility Overal bed mobility: Modified Independent             General bed mobility comments: Use of bedrails PT supervision    Transfers Overall transfer level: Needs assistance Equipment used: None Transfers: Sit to/from Stand Sit to Stand: Contact guard assist           General transfer comment: contact guard assistance for safety and line management    Ambulation/Gait Ambulation/Gait assistance: Contact guard assist Gait Distance (Feet): 125 Feet Assistive device: None Gait Pattern/deviations: Step-through pattern, Drifts right/left   Gait velocity interpretation: >2.62 ft/sec, indicative of community ambulatory   General Gait Details: pt refused  use of gait belt stated she would not fall and does not need help. CGA for safety, pt had large lateral trunk sways with weight shift during ambulation, no LOB.   Stairs             Wheelchair Mobility     Tilt Bed    Modified Rankin (Stroke Patients Only)       Balance Overall balance assessment: Needs assistance Sitting-balance support: No upper extremity supported, Feet supported Sitting balance-Leahy Scale: Good Sitting balance - Comments: Short time seated EOB as pt wanted session to end quickly   Standing balance support: During functional activity, No upper extremity supported Standing balance-Leahy Scale: Fair Standing balance comment: Large lateral sways during gait would benefit from further assessment when pt tolerates                            Cognition Arousal: Alert Behavior During Therapy: WFL for tasks assessed/performed Overall Cognitive Status: Within Functional Limits for tasks assessed                                 General Comments: pt reluctant to start session        Exercises      General Comments        Pertinent Vitals/Pain Pain Assessment Pain Assessment: No/denies pain    Home Living  Prior Function            PT Goals (current goals can now be found in the care plan section) Acute Rehab PT Goals PT Goal Formulation: With patient/family Time For Goal Achievement: 01/08/23 Potential to Achieve Goals: Good Progress towards PT goals: Progressing toward goals    Frequency    Min 1X/week      PT Plan      Co-evaluation              AM-PAC PT "6 Clicks" Mobility   Outcome Measure  Help needed turning from your back to your side while in a flat bed without using bedrails?: None Help needed moving from lying on your back to sitting on the side of a flat bed without using bedrails?: A Little Help needed moving to and from a bed to a chair (including  a wheelchair)?: A Little Help needed standing up from a chair using your arms (e.g., wheelchair or bedside chair)?: A Little Help needed to walk in hospital room?: A Little Help needed climbing 3-5 steps with a railing? : A Lot 6 Click Score: 18    End of Session   Activity Tolerance: Other (comment) (Self limiting) Patient left: in bed;with call bell/phone within reach Nurse Communication: Mobility status PT Visit Diagnosis: Unsteadiness on feet (R26.81);Muscle weakness (generalized) (M62.81)     Time: 1610-9604 PT Time Calculation (min) (ACUTE ONLY): 11 min  Charges:    $Gait Training: 8-22 mins PT General Charges $$ ACUTE PT VISIT: 1 Visit                     Andrey Farmer. SPT Secure chat preferred    Darlin Drop 12/27/2022, 3:52 PM

## 2022-12-27 NOTE — Progress Notes (Signed)
Paged Dr. Versie Starks regarding calcium levels. Provider aware.

## 2022-12-28 ENCOUNTER — Inpatient Hospital Stay (HOSPITAL_COMMUNITY): Payer: BLUE CROSS/BLUE SHIELD

## 2022-12-28 DIAGNOSIS — R7989 Other specified abnormal findings of blood chemistry: Secondary | ICD-10-CM | POA: Diagnosis not present

## 2022-12-28 DIAGNOSIS — R7401 Elevation of levels of liver transaminase levels: Secondary | ICD-10-CM | POA: Diagnosis not present

## 2022-12-28 DIAGNOSIS — D500 Iron deficiency anemia secondary to blood loss (chronic): Secondary | ICD-10-CM

## 2022-12-28 DIAGNOSIS — R112 Nausea with vomiting, unspecified: Secondary | ICD-10-CM | POA: Diagnosis not present

## 2022-12-28 DIAGNOSIS — K7200 Acute and subacute hepatic failure without coma: Secondary | ICD-10-CM

## 2022-12-28 DIAGNOSIS — R195 Other fecal abnormalities: Secondary | ICD-10-CM

## 2022-12-28 DIAGNOSIS — R197 Diarrhea, unspecified: Secondary | ICD-10-CM

## 2022-12-28 DIAGNOSIS — K529 Noninfective gastroenteritis and colitis, unspecified: Secondary | ICD-10-CM

## 2022-12-28 LAB — COMPREHENSIVE METABOLIC PANEL
ALT: 121 U/L — ABNORMAL HIGH (ref 0–44)
AST: 276 U/L — ABNORMAL HIGH (ref 15–41)
Albumin: 1.7 g/dL — ABNORMAL LOW (ref 3.5–5.0)
Alkaline Phosphatase: 63 U/L (ref 38–126)
Anion gap: 7 (ref 5–15)
BUN: 6 mg/dL (ref 6–20)
CO2: 20 mmol/L — ABNORMAL LOW (ref 22–32)
Calcium: 6 mg/dL — CL (ref 8.9–10.3)
Chloride: 115 mmol/L — ABNORMAL HIGH (ref 98–111)
Creatinine, Ser: 1.09 mg/dL — ABNORMAL HIGH (ref 0.44–1.00)
GFR, Estimated: 59 mL/min — ABNORMAL LOW (ref >60)
Glucose, Bld: 82 mg/dL (ref 70–99)
Potassium: 3.7 mmol/L (ref 3.5–5.1)
Sodium: 142 mmol/L (ref 135–145)
Total Bilirubin: 0.6 mg/dL (ref <1.2)
Total Protein: 4.5 g/dL — ABNORMAL LOW (ref 6.5–8.1)

## 2022-12-28 LAB — CBC WITH DIFFERENTIAL/PLATELET
Abs Immature Granulocytes: 0 10*3/uL (ref 0.00–0.07)
Basophils Absolute: 0.1 10*3/uL (ref 0.0–0.1)
Basophils Relative: 1 %
Eosinophils Absolute: 0.2 10*3/uL (ref 0.0–0.5)
Eosinophils Relative: 3 %
HCT: 24.9 % — ABNORMAL LOW (ref 36.0–46.0)
Hemoglobin: 8.3 g/dL — ABNORMAL LOW (ref 12.0–15.0)
Lymphocytes Relative: 42 %
Lymphs Abs: 2.3 10*3/uL (ref 0.7–4.0)
MCH: 30.6 pg (ref 26.0–34.0)
MCHC: 33.3 g/dL (ref 30.0–36.0)
MCV: 91.9 fL (ref 80.0–100.0)
Monocytes Absolute: 0.5 10*3/uL (ref 0.1–1.0)
Monocytes Relative: 9 %
Neutro Abs: 2.5 10*3/uL (ref 1.7–7.7)
Neutrophils Relative %: 45 %
Platelets: 164 10*3/uL (ref 150–400)
RBC: 2.71 MIL/uL — ABNORMAL LOW (ref 3.87–5.11)
RDW: 18 % — ABNORMAL HIGH (ref 11.5–15.5)
Smear Review: NORMAL
WBC: 5.5 10*3/uL (ref 4.0–10.5)
nRBC: 1.1 % — ABNORMAL HIGH (ref 0.0–0.2)

## 2022-12-28 LAB — PROTIME-INR
INR: 1.3 — ABNORMAL HIGH (ref 0.8–1.2)
Prothrombin Time: 16.6 s — ABNORMAL HIGH (ref 11.4–15.2)

## 2022-12-28 LAB — PHOSPHORUS: Phosphorus: 2 mg/dL — ABNORMAL LOW (ref 2.5–4.6)

## 2022-12-28 LAB — ACETAMINOPHEN LEVEL: Acetaminophen (Tylenol), Serum: 18 ug/mL (ref 10–30)

## 2022-12-28 LAB — MAGNESIUM: Magnesium: 1.1 mg/dL — ABNORMAL LOW (ref 1.7–2.4)

## 2022-12-28 MED ORDER — CALCIUM GLUCONATE-NACL 1-0.675 GM/50ML-% IV SOLN
1.0000 g | Freq: Once | INTRAVENOUS | Status: AC
  Administered 2022-12-28: 1000 mg via INTRAVENOUS
  Filled 2022-12-28: qty 50

## 2022-12-28 MED ORDER — TECHNETIUM TC 99M MEBROFENIN IV KIT
4.9100 | PACK | Freq: Once | INTRAVENOUS | Status: AC | PRN
  Administered 2022-12-28: 4.91 via INTRAVENOUS

## 2022-12-28 MED ORDER — PEG 3350-KCL-NA BICARB-NACL 420 G PO SOLR
4000.0000 mL | Freq: Once | ORAL | Status: AC
  Administered 2022-12-28: 4000 mL via ORAL
  Filled 2022-12-28: qty 4000

## 2022-12-28 MED ORDER — ACETAMINOPHEN 500 MG PO TABS
500.0000 mg | ORAL_TABLET | Freq: Once | ORAL | Status: AC
  Administered 2022-12-28: 500 mg via ORAL

## 2022-12-28 MED ORDER — GERHARDT'S BUTT CREAM
TOPICAL_CREAM | Freq: Three times a day (TID) | CUTANEOUS | Status: DC
  Filled 2022-12-28: qty 1

## 2022-12-28 MED ORDER — LIDOCAINE 5 % EX PTCH
1.0000 | MEDICATED_PATCH | Freq: Every day | CUTANEOUS | Status: DC
  Filled 2022-12-28: qty 1

## 2022-12-28 MED ORDER — SODIUM CHLORIDE 0.9 % IV SOLN: INTRAVENOUS | Status: DC

## 2022-12-28 MED ORDER — ACETAMINOPHEN 500 MG PO TABS
500.0000 mg | ORAL_TABLET | Freq: Once | ORAL | Status: DC
  Filled 2022-12-28: qty 1

## 2022-12-28 MED ORDER — HYDROMORPHONE HCL 2 MG PO TABS: 1.0000 mg | ORAL_TABLET | Freq: Once | ORAL | Status: DC

## 2022-12-28 MED ORDER — MAGNESIUM SULFATE 2 GM/50ML IV SOLN
2.0000 g | Freq: Once | INTRAVENOUS | Status: AC
  Administered 2022-12-28: 2 g via INTRAVENOUS
  Filled 2022-12-28: qty 50

## 2022-12-28 NOTE — Progress Notes (Addendum)
Progress Note   LOS: 4 days   Chief Complaint:Nausea, vomiting, diarrhea, dysphagia, elevated LFTs    Subjective   Patient recently returned from HIDA scan.  She reports to the RN that she does not want to take her vitamins.  Had an extensive discussion with patient about proceeding with EGD/colonoscopy.  She is unsure if she wants to do colonoscopy since she does not want to do the prep or be on a clear liquid diet.  She wants to wait until her family member is in attendance and talk to him before proceeding with a decision.  She states she no longer has diarrhea, nausea, or vomiting.   Objective   Vital signs in last 24 hours: Temp:  [97.6 F (36.4 C)-98.4 F (36.9 C)] 98.4 F (36.9 C) (11/05 1300) Pulse Rate:  [79-88] 79 (11/05 1300) Resp:  [20-21] 20 (11/05 1300) BP: (101-118)/(71-84) 111/76 (11/05 1300) SpO2:  [92 %-100 %] 100 % (11/05 1300) Weight:  [55.8 kg] 55.8 kg (11/05 0424) Last BM Date : 12/25/22 Last BM recorded by nurses in past 5 days Stool Type: Type 6 (Mushy consistency with ragged edges) (12/28/2022  1:23 AM)  General:   female in no acute distress  Heart:  Regular rate and rhythm; no murmurs Pulm: Clear anteriorly; no wheezing Abdomen: soft, nondistended, normal bowel sounds in all quadrants. Nontender without guarding. No organomegaly appreciated. Extremities:  No edema Neurologic:  Alert and  oriented x4;  No focal deficits.  Psych:  Cooperative. Normal mood and affect.  Intake/Output from previous day: 11/04 0701 - 11/05 0700 In: 290 [P.O.:240; IV Piggyback:50] Out: -  Intake/Output this shift: No intake/output data recorded.  Studies/Results: US Abdomen Limited RUQ (LIVER/GB)  Result Date: 12/27/2022 CLINICAL DATA:  Elevated liver function tests. EXAM: ULTRASOUND ABDOMEN LIMITED RIGHT UPPER QUADRANT COMPARISON:  July 26, 2022. FINDINGS: Gallbladder: 1 cm gallstone is noted. Minimal gallbladder wall thickening is noted at approximately 4 mm. No  sonographic Murphy's sign. Common bile duct: Diameter: 3 mm which is within normal limits. Liver: Minimally increased echogenicity of hepatic parenchyma is noted suggesting hepatic steatosis. Portal vein is patent on color Doppler imaging with normal direction of blood flow towards the liver. Other: Minimal ascites. IMPRESSION: Cholelithiasis is noted with minimal gallbladder wall thickening, but no sonographic Murphy's sign. If there is clinical concern for cholecystitis, HIDA scan is recommended for further evaluation. Possible hepatic steatosis. Electronically Signed   By: Lupita Raider M.D.   On: 12/27/2022 12:14    Lab Results: Recent Labs    12/26/22 0901 12/27/22 0442 12/28/22 0733  WBC 5.6 5.0 5.5  HGB 9.5* 8.3* 8.3*  HCT 26.5* 24.3* 24.9*  PLT 152 141* 164   BMET Recent Labs    12/26/22 1849 12/27/22 0442 12/28/22 0733  NA 142 139 142  K 4.3 3.8 3.7  CL 111 115* 115*  CO2 16* 17* 20*  GLUCOSE 129* 84 82  BUN 12 9 6   CREATININE 1.28* 1.19* 1.09*  CALCIUM 6.6* 6.0* 6.0*   LFT Recent Labs    12/28/22 0733  PROT 4.5*  ALBUMIN 1.7*  AST 276*  ALT 121*  ALKPHOS 63  BILITOT 0.6   PT/INR Recent Labs    12/28/22 0733  LABPROT 16.6*  INR 1.3*     Scheduled Meds:  feeding supplement  237 mL Oral TID BM   folic acid  1 mg Oral Daily   Gerhardt's butt cream   Topical TID AC & HS  levETIRAcetam  500 mg Oral BID   lidocaine  1 patch Transdermal Daily   lipase/protease/amylase  72,000 Units Oral TID WC   multivitamin with minerals  1 tablet Oral Daily   pantoprazole (PROTONIX) IV  40 mg Intravenous Q12H   phosphorus  250 mg Oral TID   thiamine  100 mg Oral Daily   Or   thiamine  100 mg Intravenous Daily   Vitamin D (Ergocalciferol)  50,000 Units Oral Q7 days   Continuous Infusions:  calcium gluconate 1,000 mg (12/28/22 1300)   levETIRAcetam 500 mg (12/28/22 0839)   magnesium sulfate bolus IVPB     potassium PHOSPHATE IVPB (in mmol)        Patient  profile:   57 year old female history of chronic EtOH abuse, seizure disorder, prior GI bleed felt secondary to gastric AVM with EGD 05/2022 revealing 1 gastric AVM treated with APC and nonbleeding duodenal bulb ulcer secondary to NSAID use presenting for elevated LFTs, nausea, vomiting, diarrhea, and dysphagia      Impression:   Acute on chronic anemia Hgb 8.3 stable s/p 2 units prbcs Persistent acute on chronic anemia with history of duodenal ulcer April 2024.  Extensive discussion with about importance of doing both EGD and colonoscopy to evaluate for anemia.  Patient is hesitant towards colonoscopy due to not wanting to do the prep.  Discussed with patient we cannot rule out source of possible GI bleed being etiology of her anemia if we do not do both EGD and colonoscopy.  Happy to do just EGD alone but this will not help evaluate the colon which could be a source of her anemia.  Patient opts to wait and discuss with a family member. - Preferably proceed with EGD/colonoscopy, however, if patient declines colon can proceed with EGD only. Scheduled for tomorrow - N.p.o. midnight - If patient agrees to colonoscopy then please do clear liquids with GoLytely prep - I thoroughly discussed the procedure with the patient (at bedside) to include nature of the procedure, alternatives, benefits, and risks (including but not limited to bleeding, infection, perforation, anesthesia/cardiac pulmonary complications).  Patient verbalized understanding and gave verbal consent to proceed with procedure.   Dysphagia Nausea/vomiting Both solids and liquids MBS protocol unable to be followed due to patient noncompliance HIDA scan pending Patient agreeable for EGD, will do tomorrow, n.p.o. midnight   Diarrhea Ongoing for 3 months with negative GI pathogen panel and C. difficile.  No history of colonoscopy.  Diarrhea is improving with loperamide.  May need to do colonoscopy to rule out microscopic colitis No  further diarrhea per patient, she is declining taking Creon.  Well-controlled on loperamide   Elevated LFTs AST 276/ALT 121, improving T. bili 0.6, improving Ethanol normal No new medications.  Unknown etiology.  Negative hepatitis panel.  History of alcohol use.  RUQ ultrasound shows cholelithiasis with gallbladder wall thickening and recommends HIDA.  MELD 3.0: 14 12/28/2022 7:33 AM - Continue to trend LFTs   Seizure disorder On Keppra  Bayley M McMichael  12/28/2022, 1:36 PM   Attending physician's note   I have taken a history, reviewed the chart and examined the patient. I performed a substantive portion of this encounter, including complete performance of at least one of the key components, in conjunction with the APP. I agree with the APP's note, impression and recommendations.   HIDA scan negative for cholecystitis. Transaminases trending down, continue to monitor LFT Patient is willing to proceed with EGD and colonoscopy for further evaluation of  dysphagia, nausea, vomiting and chronic diarrhea Clear liquid diet N.p.o. after midnight  The patient was provided an opportunity to ask questions and all were answered. The patient agreed with the plan and demonstrated an understanding of the instructions.   Iona Beard , MD [phone]

## 2022-12-28 NOTE — Progress Notes (Signed)
HD#4 SUBJECTIVE:  Patient Summary: Christina Byrd is a 57 y.o. with a PMH of alcohol use do, CKD stage 3b, GERD, Seizure do, angiodysplasia of the stomach and duodenum, duodenal ulcers, hematemesis and melena 2/2 to NSAID and alcohol use presenting with as an admission from clinic due to hypotension.  Overnight Events:Complained of pain on the buttocks from the erosions she has had. Got one dose of tylenol. Refused other pain meds.   Interim History: Pain in the buttocks about the same. Has not improved with tylenol. Abdominal pain more centered towards navel.   OBJECTIVE:  Vital Signs: Vitals:   12/27/22 1553 12/27/22 1947 12/28/22 0410 12/28/22 0424  BP: 118/84 101/73 103/71   Pulse: 88 86    Resp: 20 20 (!) 21   Temp: 97.6 F (36.4 C) 97.8 F (36.6 C) 98.4 F (36.9 C)   TempSrc: Oral Oral Oral   SpO2: 100% 97% 92%   Weight:    55.8 kg  Height:       Supplemental O2: Room Air SpO2: 92 %  Filed Weights   12/25/22 0320 12/26/22 0544 12/28/22 0424  Weight: 55.2 kg 55.6 kg 55.8 kg     Intake/Output Summary (Last 24 hours) at 12/28/2022 3664 Last data filed at 12/27/2022 1500 Gross per 24 hour  Intake 49.96 ml  Output --  Net 49.96 ml   Net IO Since Admission: 4,474.1 mL [12/28/22 0937]  Physical Exam:  Physical Exam Constitutional:      Appearance: She is ill-appearing. She is not toxic-appearing.  HENT:     Head: Atraumatic.     Mouth/Throat:     Mouth: Mucous membranes are moist.  Cardiovascular:     Rate and Rhythm: Normal rate and regular rhythm.     Heart sounds: No murmur heard.    No friction rub. No gallop.  Pulmonary:     Effort: Pulmonary effort is normal. No respiratory distress.  Abdominal:     General: Abdomen is flat. Bowel sounds are normal. There is no distension.     Palpations: Abdomen is soft.     Tenderness: There is abdominal tenderness. There is no guarding or rebound.  Musculoskeletal:        General: No swelling.     Right  lower leg: No edema.     Left lower leg: No edema.  Skin:    General: Skin is dry.     Capillary Refill: Capillary refill takes less than 2 seconds.     Coloration: Skin is not pale.     Findings: Lesion present.     Comments: Ulcerations over the perianal region. Non fuctuant erythematous with elevated borders. Some extending over the labia majora.     Patient Lines/Drains/Airways Status     Active Line/Drains/Airways     Name Placement date Placement time Site Days   Peripheral IV 12/23/22 22 G 1.75" Anterior;Left Forearm 12/23/22  1758  Forearm  1   Peripheral IV 12/24/22 22 G Anterior;Distal;Left Forearm 12/24/22  0200  Forearm  less than 1   Wound / Incision (Open or Dehisced) 12/23/22 Irritant Dermatitis (Moisture Associated Skin Damage) Other (Comment) Right;Left 12/23/22  2110  Other (Comment)  1             ASSESSMENT/PLAN:  Assessment: Principal Problem:   Shock liver Active Problems:   Dysphagia   Acute on chronic blood loss anemia   Alcohol related seizure (HCC)   Nausea & vomiting   Nausea and vomiting  Electrolyte abnormality   Malnutrition of moderate degree   Hepatic cirrhosis (HCC)   Alcohol use   Christina Byrd is a 57 y.o. with a PMH of alcohol use do, CKD stage 3b, GERD, Seizure do, angiodysplasia of the stomach and duodenum, duodenal ulcers, hematemesis and melena 2/2 to NSAID and alcohol use presenting with as an admission from clinic due to hypotension.  Plan:  #Transaminitis Unknown etiology. Seems like she has had elevations in LFTs in the past thought to be due to elevations in acetaminophen in the setting of seizures and alcohol withdrawal. Treated with NAC but acetaminophen levels were low. Acetaminophen levels today are low as well. Her elevation in LFTs happened around the time she got loperamide for this admission. Her last dose of loperamide was two midnights ago. Will continue with loperamide since it has reduced her episodes of  diarrhea. Statin was also given at this admission but CK is WNL. Hep panel at admission is negative. EtOH levels are negative. Pt amenable at getting CT scan of the abdomen and pelvis if she is under sedation.   -CT ab pelv tomorrow after EGD - will need a dose of ativan prior to her going to her CT scan -Ck, CRP -antimitochondrial antibodies pending   #Hypotension #Dysphagia #Nausea and Vomiting  Stable blood pressures but remain soft. She had less episodes of diarrhea last night. Appreciate GI- she will be getting an EGD tomorrow.  -EGD tomorrow -Calorie counts  -continue to monitor vitals  -Strict I/Os -Daily weights -Tigan for nausea -NPO at midnight  #Diarrhea It would be considered a chronic diarrhea on 11/7 (6 weeks duration). Gi panel is negative, including C diff.  Inflammatory vs malabsorption, perhaps diverticulosis with her LLQ pain.  -CT ab pelv as above  -Colonoscopy -cw loperamide   #Acute on chronic Anemia  Likely related to a GI bleed with reported dark stools. S/p 2 units of blood. Hemoglobin stable. Will need colonoscopy and EGD tomorrow as above.  -continue to trend HH -EGD and colonoscopy tomorrow  #Alcohol use disorder  #Seizure do  Should be out of the withdrawal window.  Cw Thiamine / Folic acid Cw Seizure protocol Cw Keppra   #AKI on CKD stage 3b  Cr has continued to improve and it is at baseline (1.09) today  (0.9-1.3 a month ago). Continues to get IV electrolyte replacement which will her her with her fluid replacement. -continue monitoring Cr.   #Hypocalcemia  Remains hypocalcemic. Giving 1g of IV calcium gluconate today.  Cw telemetry   #Hypomagnesemia #Hypokalemia #Hypophosphatemia  Could be due to malnutrition, N/V and diarrhea. Replete as needed.   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 Should she have Hashimoto's I would expect her to have some constipation though. -FU TSH  outpatient  #Perianal ulceration No signs of erythema or purulence today. Although borders look heaped up and erythematous. These are painful. Some of them look well rounded, concerning for infectious process. HSV negative.  -RPR although usually syphilis is non-painful -cw Gerhards butt cream as trial with vaseline made it worse.   #History of Gout - continue holding allopurinol at this time due to AKI  Best Practice: Diet: NPO IVF: none today VTE: Place and maintain sequential compression device Start: 12/23/22 2220 Code: Full DISPO: Anticipated discharge in 2 days  pending  medical work-up .  Signature: [facility]  Internal Medicine Resident, PGY-1 [facility]  Pager: [phone] 9:37 AM, 12/28/2022  Please contact the on call pager after 5 pm and on weekends at [phone].

## 2022-12-28 NOTE — Progress Notes (Signed)
Calorie Count Note  48 hour calorie count ordered.  Diet: dysphagia II Supplements: Ensure Enlive tid  Calorie count for 11/4 not completed correctly, only listed as 20% and 50% for two meals. Per discussion per patient, she states that she is was not eating any of the meals due to unable to keep anything down and dislike of the food textures. Notes she will only take ice cream and graham crackers. NPO for colonoscopy and EGD due to dysphagia, nausea, vomiting and chronic diarrhea.    Nutrition Dx: Moderate Malnutrition related to social / environmental circumstances, inability to eat, diarrhea, altered GI function, dysphagia as evidenced by mild muscle depletion, moderate muscle depletion, energy intake < or equal to 50% for > or equal to 1 month, per patient/family report.    Goal: Patient will meet greater than or equal to 90% of their needs   Intervention:   -Await results of diagnostics to determine appropriate nutrition interventions.  -Discontinue calorie count as she is only tolerating minimal  PO.  Alvino Chapel, RDLD Clinical Dietitian See AMION for contact information

## 2022-12-28 NOTE — Progress Notes (Signed)
Patient in Nuclear Medicine. GI will come by again this afternoon.

## 2022-12-28 NOTE — Plan of Care (Signed)

## 2022-12-28 NOTE — Plan of Care (Signed)
  Problem: Clinical Measurements: Goal: Ability to maintain clinical measurements within normal limits will improve Outcome: Progressing   Problem: Activity: Goal: Risk for activity intolerance will decrease Outcome: Progressing   Problem: Nutrition: Goal: Adequate nutrition will be maintained Outcome: Progressing   Problem: Coping: Goal: Level of anxiety will decrease Outcome: Progressing   Problem: Elimination: Goal: Will not experience complications related to urinary retention Outcome: Progressing   Problem: Pain Management: Goal: General experience of comfort will improve Outcome: Progressing   Problem: Safety: Goal: Ability to remain free from injury will improve Outcome: Progressing

## 2022-12-28 NOTE — H&P (View-Only) (Signed)
Progress Note   LOS: 4 days   Chief Complaint:Nausea, vomiting, diarrhea, dysphagia, elevated LFTs    Subjective   Patient recently returned from HIDA scan.  She reports to the RN that she does not want to take her vitamins.  Had an extensive discussion with patient about proceeding with EGD/colonoscopy.  She is unsure if she wants to do colonoscopy since she does not want to do the prep or be on a clear liquid diet.  She wants to wait until her family member is in attendance and talk to him before proceeding with a decision.  She states she no longer has diarrhea, nausea, or vomiting.   Objective   Vital signs in last 24 hours: Temp:  [97.6 F (36.4 C)-98.4 F (36.9 C)] 98.4 F (36.9 C) (11/05 1300) Pulse Rate:  [79-88] 79 (11/05 1300) Resp:  [20-21] 20 (11/05 1300) BP: (101-118)/(71-84) 111/76 (11/05 1300) SpO2:  [92 %-100 %] 100 % (11/05 1300) Weight:  [55.8 kg] 55.8 kg (11/05 0424) Last BM Date : 12/25/22 Last BM recorded by nurses in past 5 days Stool Type: Type 6 (Mushy consistency with ragged edges) (12/28/2022  1:23 AM)  General:   female in no acute distress  Heart:  Regular rate and rhythm; no murmurs Pulm: Clear anteriorly; no wheezing Abdomen: soft, nondistended, normal bowel sounds in all quadrants. Nontender without guarding. No organomegaly appreciated. Extremities:  No edema Neurologic:  Alert and  oriented x4;  No focal deficits.  Psych:  Cooperative. Normal mood and affect.  Intake/Output from previous day: 11/04 0701 - 11/05 0700 In: 290 [P.O.:240; IV Piggyback:50] Out: -  Intake/Output this shift: No intake/output data recorded.  Studies/Results: US Abdomen Limited RUQ (LIVER/GB)  Result Date: 12/27/2022 CLINICAL DATA:  Elevated liver function tests. EXAM: ULTRASOUND ABDOMEN LIMITED RIGHT UPPER QUADRANT COMPARISON:  July 26, 2022. FINDINGS: Gallbladder: 1 cm gallstone is noted. Minimal gallbladder wall thickening is noted at approximately 4 mm. No  sonographic Murphy's sign. Common bile duct: Diameter: 3 mm which is within normal limits. Liver: Minimally increased echogenicity of hepatic parenchyma is noted suggesting hepatic steatosis. Portal vein is patent on color Doppler imaging with normal direction of blood flow towards the liver. Other: Minimal ascites. IMPRESSION: Cholelithiasis is noted with minimal gallbladder wall thickening, but no sonographic Murphy's sign. If there is clinical concern for cholecystitis, HIDA scan is recommended for further evaluation. Possible hepatic steatosis. Electronically Signed   By: Lupita Raider M.D.   On: 12/27/2022 12:14    Lab Results: Recent Labs    12/26/22 0901 12/27/22 0442 12/28/22 0733  WBC 5.6 5.0 5.5  HGB 9.5* 8.3* 8.3*  HCT 26.5* 24.3* 24.9*  PLT 152 141* 164   BMET Recent Labs    12/26/22 1849 12/27/22 0442 12/28/22 0733  NA 142 139 142  K 4.3 3.8 3.7  CL 111 115* 115*  CO2 16* 17* 20*  GLUCOSE 129* 84 82  BUN 12 9 6   CREATININE 1.28* 1.19* 1.09*  CALCIUM 6.6* 6.0* 6.0*   LFT Recent Labs    12/28/22 0733  PROT 4.5*  ALBUMIN 1.7*  AST 276*  ALT 121*  ALKPHOS 63  BILITOT 0.6   PT/INR Recent Labs    12/28/22 0733  LABPROT 16.6*  INR 1.3*     Scheduled Meds:  feeding supplement  237 mL Oral TID BM   folic acid  1 mg Oral Daily   Gerhardt's butt cream   Topical TID AC & HS  levETIRAcetam  500 mg Oral BID   lidocaine  1 patch Transdermal Daily   lipase/protease/amylase  72,000 Units Oral TID WC   multivitamin with minerals  1 tablet Oral Daily   pantoprazole (PROTONIX) IV  40 mg Intravenous Q12H   phosphorus  250 mg Oral TID   thiamine  100 mg Oral Daily   Or   thiamine  100 mg Intravenous Daily   Vitamin D (Ergocalciferol)  50,000 Units Oral Q7 days   Continuous Infusions:  calcium gluconate 1,000 mg (12/28/22 1300)   levETIRAcetam 500 mg (12/28/22 0839)   magnesium sulfate bolus IVPB     potassium PHOSPHATE IVPB (in mmol)        Patient  profile:   57 year old female history of chronic EtOH abuse, seizure disorder, prior GI bleed felt secondary to gastric AVM with EGD 05/2022 revealing 1 gastric AVM treated with APC and nonbleeding duodenal bulb ulcer secondary to NSAID use presenting for elevated LFTs, nausea, vomiting, diarrhea, and dysphagia      Impression:   Acute on chronic anemia Hgb 8.3 stable s/p 2 units prbcs Persistent acute on chronic anemia with history of duodenal ulcer April 2024.  Extensive discussion with about importance of doing both EGD and colonoscopy to evaluate for anemia.  Patient is hesitant towards colonoscopy due to not wanting to do the prep.  Discussed with patient we cannot rule out source of possible GI bleed being etiology of her anemia if we do not do both EGD and colonoscopy.  Happy to do just EGD alone but this will not help evaluate the colon which could be a source of her anemia.  Patient opts to wait and discuss with a family member. - Preferably proceed with EGD/colonoscopy, however, if patient declines colon can proceed with EGD only. Scheduled for tomorrow - N.p.o. midnight - If patient agrees to colonoscopy then please do clear liquids with GoLytely prep - I thoroughly discussed the procedure with the patient (at bedside) to include nature of the procedure, alternatives, benefits, and risks (including but not limited to bleeding, infection, perforation, anesthesia/cardiac pulmonary complications).  Patient verbalized understanding and gave verbal consent to proceed with procedure.   Dysphagia Nausea/vomiting Both solids and liquids MBS protocol unable to be followed due to patient noncompliance HIDA scan pending Patient agreeable for EGD, will do tomorrow, n.p.o. midnight   Diarrhea Ongoing for 3 months with negative GI pathogen panel and C. difficile.  No history of colonoscopy.  Diarrhea is improving with loperamide.  May need to do colonoscopy to rule out microscopic colitis No  further diarrhea per patient, she is declining taking Creon.  Well-controlled on loperamide   Elevated LFTs AST 276/ALT 121, improving T. bili 0.6, improving Ethanol normal No new medications.  Unknown etiology.  Negative hepatitis panel.  History of alcohol use.  RUQ ultrasound shows cholelithiasis with gallbladder wall thickening and recommends HIDA.  MELD 3.0: 14 12/28/2022 7:33 AM - Continue to trend LFTs   Seizure disorder On Keppra  Bayley M McMichael  12/28/2022, 1:36 PM   Attending physician's note   I have taken a history, reviewed the chart and examined the patient. I performed a substantive portion of this encounter, including complete performance of at least one of the key components, in conjunction with the APP. I agree with the APP's note, impression and recommendations.   HIDA scan negative for cholecystitis. Transaminases trending down, continue to monitor LFT Patient is willing to proceed with EGD and colonoscopy for further evaluation of  dysphagia, nausea, vomiting and chronic diarrhea Clear liquid diet N.p.o. after midnight  The patient was provided an opportunity to ask questions and all were answered. The patient agreed with the plan and demonstrated an understanding of the instructions.   Iona Beard , MD [phone]

## 2022-12-28 NOTE — Progress Notes (Signed)
CSW received consult for patient. CSW spoke with patient at bedside. Patient reports she comes from home with spouse. CSW offered patient outpatient substance use treatment services resources. Patient accepted. All questions answered. Patient reports her spouse will transport her home when medically ready for dc. No further questions reported at this time.

## 2022-12-28 NOTE — Progress Notes (Addendum)
Evaluated the patient bedside for pain. She appears very uncomfortable on exam. The pain is mostly related to her gluteal wound and she describes it as severe. She also describes a tingling pain in her hands and legs, which seems to be chronic and which she states usually self-resolves. Options for pain management are limited given chronic kidney disease and liver disease. Since an acetaminophen level is scheduled to be checked in the morning in the setting of her acutely rising LFTs, I initially offered the patient a low dose of PO dilaudid, but she noted a previous episode in which a medication "made her too sleepy" and refused this medication, so I gave her a one time dose of acetaminophen 500 mg.

## 2022-12-29 ENCOUNTER — Inpatient Hospital Stay (HOSPITAL_COMMUNITY): Payer: BLUE CROSS/BLUE SHIELD

## 2022-12-29 ENCOUNTER — Encounter (HOSPITAL_COMMUNITY): Payer: Self-pay | Admitting: Infectious Diseases

## 2022-12-29 ENCOUNTER — Encounter (HOSPITAL_COMMUNITY)
Admission: AD | Disposition: A | Discharge status: Home / Self Care | Payer: Self-pay | Source: Ambulatory Visit | Attending: Internal Medicine

## 2022-12-29 DIAGNOSIS — R7401 Elevation of levels of liver transaminase levels: Secondary | ICD-10-CM | POA: Diagnosis not present

## 2022-12-29 DIAGNOSIS — R1310 Dysphagia, unspecified: Secondary | ICD-10-CM | POA: Diagnosis not present

## 2022-12-29 DIAGNOSIS — R112 Nausea with vomiting, unspecified: Secondary | ICD-10-CM | POA: Diagnosis not present

## 2022-12-29 DIAGNOSIS — D500 Iron deficiency anemia secondary to blood loss (chronic): Secondary | ICD-10-CM | POA: Diagnosis not present

## 2022-12-29 DIAGNOSIS — I959 Hypotension, unspecified: Secondary | ICD-10-CM | POA: Diagnosis not present

## 2022-12-29 HISTORY — PX: ESOPHAGOGASTRODUODENOSCOPY: SHX5428

## 2022-12-29 LAB — MAGNESIUM: Magnesium: 1.2 mg/dL — ABNORMAL LOW (ref 1.7–2.4)

## 2022-12-29 LAB — COMPREHENSIVE METABOLIC PANEL
ALT: 91 U/L — ABNORMAL HIGH (ref 0–44)
AST: 131 U/L — ABNORMAL HIGH (ref 15–41)
Albumin: 1.8 g/dL — ABNORMAL LOW (ref 3.5–5.0)
Alkaline Phosphatase: 60 U/L (ref 38–126)
Anion gap: 9 (ref 5–15)
BUN: 5 mg/dL — ABNORMAL LOW (ref 6–20)
CO2: 19 mmol/L — ABNORMAL LOW (ref 22–32)
Calcium: 6.1 mg/dL — CL (ref 8.9–10.3)
Chloride: 111 mmol/L (ref 98–111)
Creatinine, Ser: 0.9 mg/dL (ref 0.44–1.00)
GFR, Estimated: >60 mL/min (ref >60)
Glucose, Bld: 75 mg/dL (ref 70–99)
Potassium: 3.7 mmol/L (ref 3.5–5.1)
Sodium: 139 mmol/L (ref 135–145)
Total Bilirubin: 0.7 mg/dL (ref <1.2)
Total Protein: 4.4 g/dL — ABNORMAL LOW (ref 6.5–8.1)

## 2022-12-29 LAB — PROTIME-INR
INR: 1.2 (ref 0.8–1.2)
Prothrombin Time: 15.3 s — ABNORMAL HIGH (ref 11.4–15.2)

## 2022-12-29 LAB — PHOSPHORUS: Phosphorus: 2.4 mg/dL — ABNORMAL LOW (ref 2.5–4.6)

## 2022-12-29 LAB — MITOCHONDRIAL ANTIBODIES: Mitochondrial M2 Ab, IgG: <20 U (ref 0.0–20.0)

## 2022-12-29 LAB — RPR: RPR Ser Ql: NONREACTIVE

## 2022-12-29 SURGERY — EGD (ESOPHAGOGASTRODUODENOSCOPY)
Anesthesia: Monitor Anesthesia Care

## 2022-12-29 MED ORDER — K PHOS MONO-SOD PHOS DI & MONO 155-852-130 MG PO TABS
500.0000 mg | ORAL_TABLET | Freq: Two times a day (BID) | ORAL | Status: DC
  Filled 2022-12-29 (×2): qty 2

## 2022-12-29 MED ORDER — CALCIUM GLUCONATE-NACL 2-0.675 GM/100ML-% IV SOLN
2.0000 g | Freq: Once | INTRAVENOUS | Status: AC
  Administered 2022-12-29: 2000 mg via INTRAVENOUS
  Filled 2022-12-29: qty 100

## 2022-12-29 MED ORDER — FLUTICASONE PROPIONATE 50 MCG/ACT NA SUSP
2.0000 | Freq: Every day | NASAL | Status: DC
  Administered 2022-12-30: 2 via NASAL
  Filled 2022-12-29: qty 16

## 2022-12-29 MED ORDER — LORAZEPAM 2 MG/ML IJ SOLN: 0.5000 mg | Freq: Once | INTRAMUSCULAR | Status: DC

## 2022-12-29 MED ORDER — ZINC OXIDE 12.8 % EX OINT: TOPICAL_OINTMENT | CUTANEOUS | Status: DC | PRN

## 2022-12-29 MED ORDER — LORAZEPAM BOLUS VIA INFUSION: 0.5000 mg | Freq: Once | INTRAVENOUS | Status: DC

## 2022-12-29 MED ORDER — PROPOFOL 10 MG/ML IV BOLUS
INTRAVENOUS | Status: DC | PRN
  Administered 2022-12-29: 80 mg via INTRAVENOUS
  Administered 2022-12-29: 20 mg via INTRAVENOUS

## 2022-12-29 MED ORDER — LORAZEPAM 2 MG/ML IJ SOLN
0.5000 mg | Freq: Once | INTRAMUSCULAR | Status: AC
  Administered 2022-12-29: 0.5 mg via INTRAVENOUS
  Filled 2022-12-29: qty 1

## 2022-12-29 MED ORDER — LIDOCAINE 5 % EX PTCH
2.0000 | MEDICATED_PATCH | CUTANEOUS | Status: DC
  Filled 2022-12-29 (×2): qty 2

## 2022-12-29 MED ORDER — SODIUM CHLORIDE 0.9 % IV SOLN: INTRAVENOUS | Status: DC | PRN

## 2022-12-29 MED ORDER — PHENYLEPHRINE HCL (PRESSORS) 10 MG/ML IV SOLN
INTRAVENOUS | Status: DC | PRN
  Administered 2022-12-29: 80 ug via INTRAVENOUS

## 2022-12-29 MED ORDER — LIDOCAINE 2% (20 MG/ML) 5 ML SYRINGE
INTRAMUSCULAR | Status: DC | PRN
  Administered 2022-12-29: 50 mg via INTRAVENOUS

## 2022-12-29 MED ORDER — IOHEXOL 9 MG/ML PO SOLN: 500.0000 mL | ORAL | Status: AC

## 2022-12-29 MED ORDER — MAGNESIUM SULFATE 2 GM/50ML IV SOLN
2.0000 g | Freq: Once | INTRAVENOUS | Status: AC
  Administered 2022-12-29: 2 g via INTRAVENOUS
  Filled 2022-12-29: qty 50

## 2022-12-29 MED ORDER — K PHOS MONO-SOD PHOS DI & MONO 155-852-130 MG PO TABS
250.0000 mg | ORAL_TABLET | Freq: Two times a day (BID) | ORAL | Status: AC
  Administered 2022-12-29 (×2): 250 mg via ORAL
  Filled 2022-12-29 (×2): qty 1

## 2022-12-29 MED ORDER — MAGNESIUM SULFATE 2 GM/50ML IV SOLN: 2.0000 g | Freq: Once | INTRAVENOUS | Status: DC

## 2022-12-29 NOTE — Progress Notes (Signed)
Mobility Specialist Progress Note;   12/29/22 1610  Mobility  Activity Ambulated independently in hallway  Level of Assistance Standby assist, set-up cues, supervision of patient - no hands on  Assistive Device None  Distance Ambulated (ft) 350 ft  Activity Response Tolerated well  Mobility Referral Yes  $Mobility charge 1 Mobility  Mobility Specialist Start Time (ACUTE ONLY) 1610  Mobility Specialist Stop Time (ACUTE ONLY) 1625  Mobility Specialist Time Calculation (min) (ACUTE ONLY) 15 min   Pt agreeable to mobility with encouragement. Requires no physical assistance during ambulation, SV. Asx throughout. Stated her shoulders were hurting earlier today but not during this session. Pt back in bed with all needs met.   Caesar Bookman Mobility Specialist Please contact via SecureChat or Rehab Office [phone]

## 2022-12-29 NOTE — Progress Notes (Signed)
Physical Therapy Discharge Patient Details Name: Christina Byrd MRN: [id number] DOB: 1965/06/16 Today's Date: 12/29/2022 Time: 9528-4132 PT Time Calculation (min) (ACUTE ONLY): 10 min  Patient discharged from PT services secondary to goals met and no further PT needs identified.Pt at baseline per pt and husband.   Please see latest therapy progress note for current level of functioning and progress toward goals.    Progress and discharge plan discussed with patient and/or caregiver: Patient/Caregiver agrees with plan  GP     Bevelyn Buckles 12/29/2022, 1:36 PM  Maalle Starrett M,PT Acute Rehab Services [phone]

## 2022-12-29 NOTE — Interval H&P Note (Signed)
History and Physical Interval Note:  12/29/2022 9:33 AM  Christina Byrd  has presented today for surgery, with the diagnosis of Acute on chronic anemia, dysphagia, nausea vomiting, history of ulcers.  The various methods of treatment have been discussed with the patient and family. After consideration of risks, benefits and other options for treatment, the patient has consented to  Procedure(s): ESOPHAGOGASTRODUODENOSCOPY (EGD) (N/A) as a surgical intervention.  The patient's history has been reviewed, patient examined, no change in status, stable for surgery.  I have reviewed the patient's chart and labs.  Questions were answered to the patient's satisfaction.    Patient did not drink bowel prep, hence cannot proceed with colonoscopy   Christina Byrd

## 2022-12-29 NOTE — TOC Initial Note (Signed)
Transition of Care [facility]) - Initial/Assessment Note    Patient Details  Name: Christina Byrd MRN: [id number] Date of Birth: July 30, 1965  Transition [facility] CM/SW Contact:    Lawerance Sabal, RN Phone Number: 12/29/2022, 10:45 AM  Clinical Narrative:                  Patient off unit, spoke w spouse in room.  Discussed recommendations for [facility] PT, unable to set up w [facility] provider due to payor source. Discussed outpatient referral, discussed locations, spouse agreeable to referral being made to [facility], added to AVS, referral made in EPIC.  No DME needs    Expected Discharge Plan: Home/Self Care Barriers to Discharge: Continued Medical Work up   Patient Goals and CMS Choice Patient states their goals for this hospitalization and ongoing recovery are:: to go home CMS Medicare.gov Compare Post Acute Care list provided to:: Other (Comment Required) Choice offered to / list presented to : Spouse      Expected Discharge Plan and Services   Discharge Planning Services: CM Consult   Living arrangements for the past 2 months: Single Family Home                                      Prior Living Arrangements/Services Living arrangements for the past 2 months: Single Family Home Lives with:: Spouse                   Activities of Daily Living   ADL Screening (condition at time of admission) Independently performs ADLs?: Yes (appropriate for developmental age) Does the patient have a NEW difficulty with bathing/dressing/toileting/self-feeding that is expected to last >3 days?: No Does the patient have a NEW difficulty with getting in/out of bed, walking, or climbing stairs that is expected to last >3 days?: No Does the patient have a NEW difficulty with communication that is expected to last >3 days?: No Is the patient deaf or have difficulty hearing?: Yes Does the patient have difficulty seeing, even when wearing glasses/contacts?: No Does the patient  have difficulty concentrating, remembering, or making decisions?: Yes  Permission Sought/Granted                  Emotional Assessment              Admission diagnosis:  Nausea & vomiting [R11.2] Nausea and vomiting [R11.2] Patient Active Problem List   Diagnosis Date Noted   Abnormal LFTs 12/28/2022   Chronic diarrhea 12/28/2022   Heme positive stool 12/28/2022   Malnutrition of moderate degree 12/27/2022   Shock liver 12/27/2022   Hepatic cirrhosis (HCC) 12/27/2022   Alcohol use 12/27/2022   Electrolyte abnormality 12/26/2022   Nausea and vomiting 12/24/2022   Nausea & vomiting 12/23/2022   Hypocalcemia 11/19/2022   AKI (acute kidney injury) (HCC) 11/19/2022   Hypoalbuminemia due to protein-calorie malnutrition (HCC) 11/19/2022   Mixed hyperlipidemia 11/19/2022   Elevated lipase 11/19/2022   Hypotension 10/21/2022   Alcohol related seizure (HCC) 09/27/2022   Osteoarthritis 09/02/2022   History of noncompliance with medical treatment 07/27/2022   Hepatic steatosis 07/27/2022   Hypokalemia 07/27/2022   High anion gap metabolic acidosis 07/27/2022   Coffee ground emesis 07/26/2022   Acute pain of right knee 07/08/2022   Transaminitis 07/08/2022   Iron deficiency anemia 07/08/2022   Alcohol withdrawal (HCC) 07/07/2022   GI bleed 06/03/2022  Alcohol abuse 06/03/2022   Duodenal ulcer hemorrhage 06/03/2022   AVM (arteriovenous malformation) of stomach, 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   Anemia due to chronic blood loss 06/03/2022   Dysphagia 03/28/2020   Cervicalgia 03/28/2020   Hot flashes 01/30/2020   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   Hypomagnesemia 03/14/2018   Depression 07/08/2016   Seizure disorder (HCC) 07/08/2016   Cocaine use    Hot flash, menopausal 06/22/2016   Intertrigo 06/22/2016   Gout of left ankle 10/09/2015   Left hip pain 09/02/2015   Alcohol use disorder 09/02/2015   Colonoscopy refused 09/02/2015    Hyperuricemia 05/27/2015   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   Left shoulder pain 04/25/2015   Pain and swelling of left ankle 12/02/2014   Tendinitis of right hip flexor 07/23/2014   Allergic rhinitis 05/13/2014   GERD without esophagitis 04/01/2014   Tendonitis, Achilles, right 04/01/2014   Osteoarthritis of left knee 12/11/2013   Ex-smoker 11/09/2008   OBESITY 08/12/2006   HYPERTENSION, BENIGN ESSENTIAL 08/12/2006   PCP:  [name], DO Pharmacy:   [id number] [place], [place] - [address] AT [facility] OF [address] [place] [address] Phone: [phone] Fax: [phone]  [id number] - [place], [place] - [address] AT [facility] OF [address] [facility] [address] [place] [address] Phone: [phone] Fax: [phone]  [facility] [address] [place] [address] Phone: [phone] Fax: [phone]     Social Determinants of Health (SDOH) Social History: SDOH Screenings   Food Insecurity: No Food Insecurity (12/23/2022)  Housing: Low Risk  (12/23/2022)  Transportation Needs: No Transportation Needs (12/23/2022)  Utilities: Not At Risk (12/23/2022)  Depression (PHQ2-9): Low Risk  (12/23/2022)  Tobacco Use: Medium Risk (12/29/2022)   SDOH Interventions:     Readmission Risk Interventions    11/21/2022    1:01 PM  Readmission Risk Prevention Plan  Transportation Screening Complete  Medication Review (RN Care Manager) Complete  PCP or Specialist appointment within 3-5 days of discharge Complete  HRI or Home Care Consult Complete  Palliative Care Screening Not Applicable  Skilled Nursing Facility Not Applicable

## 2022-12-29 NOTE — Progress Notes (Signed)
HD#5 SUBJECTIVE:  Patient Summary: Christina Byrd is a 57 y.o. with a PMH of alcohol use do, CKD stage 3b, GERD, Seizure do, angiodysplasia of the stomach and duodenum, duodenal ulcers, hematemesis and melena 2/2 to NSAID and alcohol use presenting with as an admission from clinic due to hypotension.  Overnight Events:Only took part of her colonoscopy prep.  Interim History: Is heading down to her EGD today.   OBJECTIVE:  Vital Signs: Vitals:   12/28/22 0410 12/28/22 0424 12/28/22 1300 12/28/22 2035  BP: 103/71  111/76 108/71  Pulse:   79 85  Resp: (!) 21  20 14   Temp: 98.4 F (36.9 C)  98.4 F (36.9 C) 98.1 F (36.7 C)  TempSrc: Oral  Oral Oral  SpO2: 92%  100%   Weight:  55.8 kg    Height:       Supplemental O2: Room Air SpO2: 100 %  Filed Weights   12/25/22 0320 12/26/22 0544 12/28/22 0424  Weight: 55.2 kg 55.6 kg 55.8 kg     Intake/Output Summary (Last 24 hours) at 12/29/2022 0643 Last data filed at 12/28/2022 1640 Gross per 24 hour  Intake 131.19 ml  Output --  Net 131.19 ml   Net IO Since Admission: 4,605.29 mL [12/29/22 0643]  Physical Exam:  Physical Exam Constitutional:      Appearance: She is ill-appearing. She is not toxic-appearing.  HENT:     Head: Atraumatic.     Mouth/Throat:     Mouth: Mucous membranes are moist.  Cardiovascular:     Rate and Rhythm: Normal rate and regular rhythm.     Heart sounds: No murmur heard.    No friction rub. No gallop.  Pulmonary:     Effort: Pulmonary effort is normal. No respiratory distress.  Abdominal:     General: Abdomen is flat. Bowel sounds are normal. There is no distension.     Palpations: Abdomen is soft.     Tenderness: There is no abdominal tenderness. There is no guarding or rebound.  Musculoskeletal:        General: No swelling.     Right lower leg: No edema.     Left lower leg: No edema.  Skin:    General: Skin is dry.     Capillary Refill: Capillary refill takes less than 2 seconds.      Coloration: Skin is not pale.     Findings: Lesion present.     Comments:      Patient Lines/Drains/Airways Status     Active Line/Drains/Airways     Name Placement date Placement time Site Days   Peripheral IV 12/23/22 22 G 1.75" Anterior;Left Forearm 12/23/22  1758  Forearm  1   Peripheral IV 12/24/22 22 G Anterior;Distal;Left Forearm 12/24/22  0200  Forearm  less than 1   Wound / Incision (Open or Dehisced) 12/23/22 Irritant Dermatitis (Moisture Associated Skin Damage) Other (Comment) Right;Left 12/23/22  2110  Other (Comment)  1             ASSESSMENT/PLAN:  Assessment: Principal Problem:   Shock liver Active Problems:   Dysphagia   Anemia due to chronic blood loss   Alcohol related seizure (HCC)   Nausea & vomiting   Nausea and vomiting   Electrolyte abnormality   Malnutrition of moderate degree   Hepatic cirrhosis (HCC)   Alcohol use   Abnormal LFTs   Chronic diarrhea   Heme positive stool   Christina Byrd is a 57 y.o. with  a PMH of alcohol use do, CKD stage 3b, GERD, Seizure do, angiodysplasia of the stomach and duodenum, duodenal ulcers, hematemesis and melena 2/2 to NSAID and alcohol use presenting with as an admission from clinic due to hypotension.  Plan:  #Hypotension #Dysphagia #Nausea and Vomiting  #Transaminitis Improving today. Her diarrhea has also improved. Could have been from decreased PO intake. Will go for EGD and possible colonoscopy today. CT ab/pelvis with ativan.  -EGD, possible colonoscopy  -CT ab pelv  -antimitochondrial antibodies pending  -continue to monitor vitals  -Strict I/Os -Daily weights -Tigan for nausea  #Diarrhea It would be considered a chronic diarrhea on 11/7 (6 weeks duration). Gi panel is negative, including C diff.  Inflammatory vs malabsorption, perhaps diverticulosis with her LLQ pain.  -plan as above -cw loperamide   #Acute on chronic Anemia  Likely related to a GI bleed with reported dark stools.  S/p 2 units of blood. Hemoglobin stable. Will need colonoscopy and EGD tomorrow as above.  -continue to trend HH  #Alcohol use disorder  #Seizure do  Should be out of the withdrawal window.  Cw Thiamine / Folic acid Cw Seizure protocol Cw Keppra   #AKI on CKD stage 3b  Cr has continued to improve and it is at baseline (0.9) today  (0.9-1.3 a month ago). Continues to get IV electrolyte replacement which will her her with her fluid replacement. -continue monitoring Cr.   #Vitamin D Deficiency #Hypocalcemia  Remains hypocalcemic.  -Will need 50K units of Vitamin D today, seems like she did not get it last week. -IV calcium gluconate today. -Cw telemetry   #Hypomagnesemia #Hypokalemia #Hypophosphatemia  Could be due to malnutrition, N/V and diarrhea. Replete as needed.   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 Should she have Hashimoto's I would expect her to have some constipation though. -FU TSH outpatient  #Perianal ulceration No signs of erythema or purulence today. Although borders look heaped up and erythematous. These are painful. Some of them look well rounded, concerning for infectious process. HSV negative.  -RPR although usually syphilis is non-painful -cw Gerhards butt cream as trial with vaseline made it worse.   #History of Gout - continue holding allopurinol at this time due to AKI   Best Practice: Diet: NPO IVF: none today VTE: Place and maintain sequential compression device Start: 12/23/22 2220 Code: Full DISPO: Anticipated discharge in 2 days  pending  medical work-up .  Signature: [facility]  Internal Medicine Resident, PGY-1 [facility]  Pager: [phone] 6:43 AM, 12/29/2022   Please contact the on call pager after 5 pm and on weekends at [phone].

## 2022-12-29 NOTE — Anesthesia Preprocedure Evaluation (Addendum)
Anesthesia Evaluation  Patient identified by MRN, date of birth, ID band Patient awake    Reviewed: Allergy & Precautions, NPO status , Patient's Chart, lab work & pertinent test results  History of Anesthesia Complications Negative for: history of anesthetic complications  Airway Mallampati: I  TM Distance: >3 FB Neck ROM: Full    Dental  (+) Edentulous Upper, Edentulous Lower   Pulmonary former smoker   breath sounds clear to auscultation       Cardiovascular hypertension, (-) angina  Rhythm:Regular Rate:Normal  12/24/2022 ECHO: EF 60 to 65%.  1. The LV has normal function, no regional wall motion abnormalities. Left ventricular diastolic parameters were normal.   2. RVF is normal. The right ventricular size is normal.   3. The mitral valve is normal in structure. Trivial mitral valve regurgitation.   4. The aortic valve is tricuspid. Aortic valve regurgitation is not visualized.     Neuro/Psych Seizures -,    Depression       GI/Hepatic ,GERD  Controlled,,(+)     substance abuse  alcohol use  Endo/Other  negative endocrine ROS    Renal/GU Renal InsufficiencyRenal disease     Musculoskeletal   Abdominal   Peds  Hematology  (+) Blood dyscrasia (Hb 8.3, plt 164k), anemia   Anesthesia Other Findings   Reproductive/Obstetrics                             Anesthesia Physical Anesthesia Plan  ASA: 3  Anesthesia Plan: MAC   Post-op Pain Management: Minimal or no pain anticipated   Induction:   PONV Risk Score and Plan: 2 and Treatment may vary due to age or medical condition  Airway Management Planned: Natural Airway and Simple Face Mask  Additional Equipment: None  Intra-op Plan:   Post-operative Plan:   Informed Consent: I have reviewed the patients History and Physical, chart, labs and discussed the procedure including the risks, benefits and alternatives for the proposed  anesthesia with the patient or authorized representative who has indicated his/her understanding and acceptance.       Plan Discussed with: CRNA and Surgeon  Anesthesia Plan Comments:         Anesthesia Quick Evaluation

## 2022-12-29 NOTE — Plan of Care (Signed)
Patient has been moderately compliant with her care today. She has been more reasonable with me and allowing me to do her care. I compromised with her on her certain areas and she was willing. Patient EGD this morning was normal. She went for her CT scan around 1800 and was medicated. Patient has ordered her dinner tray and is working on eating at this moment. Will continue to monitor patient.

## 2022-12-29 NOTE — Progress Notes (Addendum)
Patient only drank a quarter to a third of colonoscopy prep. I explained why she needed to drink more but she said she drank all she could. Her stool looks like a light brown colored soup.

## 2022-12-29 NOTE — Op Note (Signed)
[facility] Patient Name: Christina Byrd Procedure Date : 12/29/2022 MRN: [id number] Attending MD: Napoleon Form , MD, [id number] Date of Birth: [birth year] CSN: [id number] Age: 57 Admit Type: Inpatient Procedure:                Upper GI endoscopy Indications:              Suspected upper gastrointestinal bleeding in                            patient with chronic blood loss, Dysphagia Providers:                Napoleon Form, MD, Jacquelyn "Lynelle Doctor, RN,                            Kandice Robinsons, Technician Referring MD:             Lacretia Leigh. Ninetta Lights MD, MD Medicines:                Monitored Anesthesia Care Complications:            No immediate complications. Estimated Blood Loss:     Estimated blood loss: none. Procedure:                Pre-Anesthesia Assessment:                           - Prior to the procedure, a History and Physical                            was performed, and patient medications and                            allergies were reviewed. The patient's tolerance of                            previous anesthesia was also reviewed. The risks                            and benefits of the procedure and the sedation                            options and risks were discussed with the patient.                            All questions were answered, and informed consent                            was obtained. Prior Anticoagulants: The patient has                            taken no anticoagulant or antiplatelet agents. ASA                            Grade Assessment: III - A patient with severe  systemic disease. After reviewing the risks and                            benefits, the patient was deemed in satisfactory                            condition to undergo the procedure.                           After obtaining informed consent, the endoscope was                            passed under direct vision.  Throughout the                            procedure, the patient's blood pressure, pulse, and                            oxygen saturations were monitored continuously. The                            [id number]-[id number] ([id number]) Olympus endoscope was introduced                            through the mouth, and advanced to the second part                            of duodenum. The upper GI endoscopy was                            accomplished without difficulty. The patient                            tolerated the procedure well. Scope In: Scope Out: Findings:      The Z-line was regular and was found 38 cm from the incisors.      No endoscopic abnormality was evident in the esophagus to explain the       patient's complaint of dysphagia.      The stomach was normal.      The cardia and gastric fundus were normal on retroflexion.      The examined duodenum was normal. Impression:               - Z-line regular, 38 cm from the incisors.                           - No endoscopic esophageal abnormality to explain                            patient's dysphagia.                           - Normal stomach.                           - Normal examined duodenum.                           -  No specimens collected. Recommendation:           - Patient has a contact number available for                            emergencies. The signs and symptoms of potential                            delayed complications were discussed with the                            patient. Return to normal activities tomorrow.                            Written discharge instructions were provided to the                            patient.                           - Resume previous diet.                           - Continue present medications.                           - Alcohol cessation                           - Pantoprazole 40mg  daily                           - Anti reflux measures                           - Follow up  as outpatient for colonoscopy for                            colorectal cancer screening. Diarrhea has resolved                           - Inpatient GI signing off, please call with any                            questions Procedure Code(s):        --- Professional ---                           [phone], Esophagogastroduodenoscopy, flexible,                            transoral; diagnostic, including collection of                            specimen(s) by brushing or washing, when performed                            (separate procedure)  Diagnosis Code(s):        --- Professional ---                           R13.10, Dysphagia, unspecified                           R58, Hemorrhage, not elsewhere classified CPT copyright 2022 American Medical Association. All rights reserved. The codes documented in this report are preliminary and upon coder review may  be revised to meet current compliance requirements. Napoleon Form, MD 12/29/2022 10:06:14 AM This report has been signed electronically. Number of Addenda: 0

## 2022-12-29 NOTE — Progress Notes (Signed)
Physical Therapy Treatment and D/C Patient Details Name: Christina Byrd MRN: [id number] DOB: [birth year] Today's Date: 12/29/2022   History of Present Illness 57 yo female presents to [facility] on 10/31 from MD office for n/v/d, decreased intake, hypotension in setting of blood loss anemia with black tarry stools. Concern for diverticulitis. PMHx:  anemia, EtOH abuse, gout, R knee OA, HTN, GI bleed, CKD3, seizures, CKD stage 3b    PT Comments  Pt admitted with above diagnosis.  Pt currently without significant functional limitations and is functioning at baseline per husband. Pt without LOB and scored 21/24 on DGI suggesting low risk of falls. Pt and husband state that pt is a furniture walker at home.  Pt can be ambulated by nursing staff and mobility specialist as PT does not feel that PT services are needed at this time.  Goals unmet as she refuses to use a device therefore couldn't meet Modif I.  She didn't need any hands on assist today.  Will sign off.     If plan is discharge home, recommend the following: A little help with walking and/or transfers;A little help with bathing/dressing/bathroom;Help with stairs or ramp for entrance;Assist for transportation   Can travel by private vehicle        Equipment Recommendations  None recommended by PT    Recommendations for Other Services       Precautions / Restrictions Precautions Precautions: Fall Restrictions Weight Bearing Restrictions: No     Mobility  Bed Mobility Overal bed mobility: Independent                  Transfers Overall transfer level: Needs assistance Equipment used: None Transfers: Sit to/from Stand Sit to Stand: Supervision           General transfer comment: did not need assist to stand    Ambulation/Gait Ambulation/Gait assistance: Supervision Gait Distance (Feet): 400 Feet Assistive device: None Gait Pattern/deviations: Step-through pattern   Gait velocity interpretation: 1.31 - 2.62  ft/sec, indicative of limited community ambulator   General Gait Details: pt refused use of gait bait stated she would not fall and does not need help.Supervision given and pt didnt lose balance with challenges to balance. Spoke with husband who states that pt is at baseline gait and that she had large lateral trunk sways with weight shift during ambulation prior to admit. He assists her prn.  She has had no falls at home per pt.   Stairs             Wheelchair Mobility     Tilt Bed    Modified Rankin (Stroke Patients Only)       Balance Overall balance assessment: Needs assistance Sitting-balance support: No upper extremity supported, Feet supported Sitting balance-Leahy Scale: Good     Standing balance support: During functional activity, No upper extremity supported Standing balance-Leahy Scale: Good                   Standardized Balance Assessment Standardized Balance Assessment : Dynamic Gait Index   Dynamic Gait Index Level Surface: Normal Change in Gait Speed: Normal Gait with Horizontal Head Turns: Mild Impairment Gait with Vertical Head Turns: Normal Gait and Pivot Turn: Mild Impairment Step Over Obstacle: Normal Step Around Obstacles: Normal Steps: Mild Impairment Total Score: 21      Cognition Arousal: Alert Behavior During Therapy: WFL for tasks assessed/performed Overall Cognitive Status: Within Functional Limits for tasks assessed  Exercises      General Comments General comments (skin integrity, edema, etc.): VSS      Pertinent Vitals/Pain Pain Assessment Pain Assessment: No/denies pain    Home Living                          Prior Function            PT Goals (current goals can now be found in the care plan section) Acute Rehab PT Goals Patient Stated Goal: to go home Progress towards PT goals: Progressing toward goals    Frequency    Min  1X/week      PT Plan      Co-evaluation              AM-PAC PT "6 Clicks" Mobility   Outcome Measure  Help needed turning from your back to your side while in a flat bed without using bedrails?: None Help needed moving from lying on your back to sitting on the side of a flat bed without using bedrails?: None Help needed moving to and from a bed to a chair (including a wheelchair)?: None Help needed standing up from a chair using your arms (e.g., wheelchair or bedside chair)?: A Little Help needed to walk in hospital room?: A Little Help needed climbing 3-5 steps with a railing? : A Little 6 Click Score: 21    End of Session   Activity Tolerance: Patient tolerated treatment well Patient left: in bed;with call bell/phone within reach;with family/visitor present (sitting EOB) Nurse Communication: Mobility status PT Visit Diagnosis: Unsteadiness on feet (R26.81);Muscle weakness (generalized) (M62.81)     Time: 1610-9604 PT Time Calculation (min) (ACUTE ONLY): 10 min  Charges:    $Gait Training: 8-22 mins PT General Charges $$ ACUTE PT VISIT: 1 Visit                     Christina Byrd M,PT Acute Rehab Services [phone]    Bevelyn Buckles 12/29/2022, 1:32 PM

## 2022-12-29 NOTE — Progress Notes (Signed)
PT Cancellation Note  Patient Details Name: Christina Byrd MRN: [id number] DOB: 13-Feb-1966   Cancelled Treatment:    Reason Eval/Treat Not Completed: Patient at procedure or test/unavailable (Pt in EGD. Will return as able.)   Bevelyn Buckles 12/29/2022, 10:10 AM Octave Montrose M,PT Acute Rehab Services [phone]

## 2022-12-29 NOTE — Consult Note (Signed)
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 If this is not helping affected area could switch to triple paste which is a bit thicker and may lessen discomfort for patient.   Re consult if needed, will not follow at this time. Thanks  Montrail Mehrer [employer], RN,CWOCN, CNS, CWON-AP [phone])

## 2022-12-29 NOTE — Progress Notes (Signed)
OT Cancellation Note  Patient Details Name: Christina Byrd MRN: [id number] DOB: 01/01/1966   Cancelled Treatment:    Reason Eval/Treat Not Completed: Patient at procedure or test/ unavailable (off the unit for EGD) OT will continue to follow acutely.   Evern Bio Sarinah Doetsch 12/29/2022, 8:44 AM  Nyoka Cowden OTR/L Acute Rehabilitation Services Office: [phone]

## 2022-12-29 NOTE — Transfer of Care (Signed)
Immediate Anesthesia Transfer of Care Note  Patient: Christina Byrd  Procedure(s) Performed: ESOPHAGOGASTRODUODENOSCOPY (EGD)  Patient Location: Endoscopy Unit  Anesthesia Type:MAC  Level of Consciousness: awake, alert , and oriented  Airway & Oxygen Therapy: Patient Spontanous Breathing and Patient connected to nasal cannula oxygen  Post-op Assessment: Report given to RN and Post -op Vital signs reviewed and stable  Post vital signs: Reviewed and stable  Last Vitals:  Vitals Value Taken Time  BP 98/68   Temp 97.1   Pulse 71 12/29/22 0955  Resp 11 12/29/22 0955  SpO2 95 % 12/29/22 0955  Vitals shown include unfiled device data.  Last Pain:  Vitals:   12/29/22 0853  TempSrc: Temporal  PainSc: 0-No pain      Patients Stated Pain Goal: 0 (12/24/22 2316)  Complications: No notable events documented.

## 2022-12-29 NOTE — Anesthesia Postprocedure Evaluation (Signed)
Anesthesia Post Note  Patient: Christina Byrd  Procedure(s) Performed: ESOPHAGOGASTRODUODENOSCOPY (EGD)     Patient location during evaluation: Endoscopy Anesthesia Type: MAC Level of consciousness: awake and alert, patient cooperative and oriented Pain management: pain level controlled Vital Signs Assessment: post-procedure vital signs reviewed and stable Respiratory status: nonlabored ventilation, spontaneous breathing and respiratory function stable Cardiovascular status: stable and blood pressure returned to baseline Postop Assessment: no apparent nausea or vomiting Anesthetic complications: no   No notable events documented.  Last Vitals:  Vitals:   12/29/22 1015 12/29/22 1020  BP: 107/74 122/78  Pulse: 73 85  Resp: 16 17  Temp:    SpO2: 97% 95%    Last Pain:  Vitals:   12/29/22 1020  TempSrc:   PainSc: 0-No pain                 Nolon Yellin,E. Merline Perkin

## 2022-12-29 NOTE — Progress Notes (Signed)
SLP Cancellation Note  Patient Details Name: ADYLINE HUBERTY MRN: [id number] DOB: August 11, 1965   Cancelled treatment:       Reason Eval/Treat Not Completed: Medical issues which prohibited therapy. Orders received for re-evaluation of swallow as patient reported to not be consuming dysphagia 2 solids. Patient now on clear liquids in preparation for EGD and possible colonoscopy next date. Will plan to f/u after procedures tomorrow when patient can be evaluated at bedside with upgraded solid textures.   Ferdinand Lango MA, CCC-SLP    Nerissa Constantin [name] 12/29/2022, 1:09 PM

## 2022-12-30 ENCOUNTER — Other Ambulatory Visit (HOSPITAL_COMMUNITY): Payer: Self-pay

## 2022-12-30 DIAGNOSIS — N1832 Chronic kidney disease, stage 3b: Secondary | ICD-10-CM

## 2022-12-30 DIAGNOSIS — N179 Acute kidney failure, unspecified: Secondary | ICD-10-CM | POA: Diagnosis not present

## 2022-12-30 LAB — COMPREHENSIVE METABOLIC PANEL
ALT: 67 U/L — ABNORMAL HIGH (ref 0–44)
AST: 63 U/L — ABNORMAL HIGH (ref 15–41)
Albumin: 1.7 g/dL — ABNORMAL LOW (ref 3.5–5.0)
Alkaline Phosphatase: 67 U/L (ref 38–126)
Anion gap: 5 (ref 5–15)
BUN: 6 mg/dL (ref 6–20)
CO2: 21 mmol/L — ABNORMAL LOW (ref 22–32)
Calcium: 6.8 mg/dL — ABNORMAL LOW (ref 8.9–10.3)
Chloride: 114 mmol/L — ABNORMAL HIGH (ref 98–111)
Creatinine, Ser: 0.89 mg/dL (ref 0.44–1.00)
GFR, Estimated: >60 mL/min (ref >60)
Glucose, Bld: 94 mg/dL (ref 70–99)
Potassium: 3.4 mmol/L — ABNORMAL LOW (ref 3.5–5.1)
Sodium: 140 mmol/L (ref 135–145)
Total Bilirubin: 0.5 mg/dL (ref <1.2)
Total Protein: 4.3 g/dL — ABNORMAL LOW (ref 6.5–8.1)

## 2022-12-30 LAB — CBC WITH DIFFERENTIAL/PLATELET
Abs Immature Granulocytes: 0.1 10*3/uL — ABNORMAL HIGH (ref 0.00–0.07)
Basophils Absolute: 0 10*3/uL (ref 0.0–0.1)
Basophils Relative: 0 %
Eosinophils Absolute: 0.1 10*3/uL (ref 0.0–0.5)
Eosinophils Relative: 1 %
HCT: 22.7 % — ABNORMAL LOW (ref 36.0–46.0)
Hemoglobin: 7.8 g/dL — ABNORMAL LOW (ref 12.0–15.0)
Immature Granulocytes: 2 %
Lymphocytes Relative: 26 %
Lymphs Abs: 1.5 10*3/uL (ref 0.7–4.0)
MCH: 31.8 pg (ref 26.0–34.0)
MCHC: 34.4 g/dL (ref 30.0–36.0)
MCV: 92.7 fL (ref 80.0–100.0)
Monocytes Absolute: 1.4 10*3/uL — ABNORMAL HIGH (ref 0.1–1.0)
Monocytes Relative: 25 %
Neutro Abs: 2.7 10*3/uL (ref 1.7–7.7)
Neutrophils Relative %: 46 %
Platelets: 203 10*3/uL (ref 150–400)
RBC: 2.45 MIL/uL — ABNORMAL LOW (ref 3.87–5.11)
RDW: 17.7 % — ABNORMAL HIGH (ref 11.5–15.5)
WBC: 5.8 10*3/uL (ref 4.0–10.5)
nRBC: 0.5 % — ABNORMAL HIGH (ref 0.0–0.2)

## 2022-12-30 LAB — MAGNESIUM: Magnesium: 1.8 mg/dL (ref 1.7–2.4)

## 2022-12-30 LAB — PROTIME-INR
INR: 1.2 (ref 0.8–1.2)
Prothrombin Time: 14.9 s (ref 11.4–15.2)

## 2022-12-30 LAB — PHOSPHORUS: Phosphorus: 2.6 mg/dL (ref 2.5–4.6)

## 2022-12-30 MED ORDER — ENSURE ENLIVE PO LIQD
237.0000 mL | Freq: Three times a day (TID) | ORAL | 12 refills | Status: AC
  Filled 2022-12-30: qty 237, 1d supply, fill #0

## 2022-12-30 MED ORDER — ERGOCALCIFEROL 1.25 MG (50000 UT) PO CAPS
50000.0000 [IU] | ORAL_CAPSULE | ORAL | 0 refills | Status: DC
  Filled 2022-12-30: qty 7, 49d supply, fill #0

## 2022-12-30 MED ORDER — ZINC OXIDE 12.8 % EX OINT
TOPICAL_OINTMENT | CUTANEOUS | 0 refills | Status: DC | PRN
  Filled 2022-12-30: qty 56.7, fill #0

## 2022-12-30 MED ORDER — ADULT MULTIVITAMIN W/MINERALS CH
1.0000 | ORAL_TABLET | Freq: Every day | ORAL | 0 refills | Status: DC
  Filled 2022-12-30: qty 30, 30d supply, fill #0

## 2022-12-30 MED ORDER — ORAL CARE MOUTH RINSE: 15.0000 mL | OROMUCOSAL | Status: DC | PRN

## 2022-12-30 MED ORDER — ORAL CARE MOUTH RINSE
15.0000 mL | OROMUCOSAL | Status: DC
  Administered 2022-12-30: 15 mL via OROMUCOSAL

## 2022-12-30 MED ORDER — LOPERAMIDE HCL 2 MG PO CAPS
2.0000 mg | ORAL_CAPSULE | Freq: Four times a day (QID) | ORAL | 0 refills | Status: DC | PRN
  Filled 2022-12-30: qty 30, 8d supply, fill #0

## 2022-12-30 MED ORDER — FLUTICASONE PROPIONATE 50 MCG/ACT NA SUSP
2.0000 | Freq: Every day | NASAL | 0 refills | Status: DC
  Filled 2022-12-30: qty 16, 30d supply, fill #0

## 2022-12-30 MED ORDER — DICLOFENAC SODIUM 1 % EX GEL
2.0000 g | Freq: Four times a day (QID) | CUTANEOUS | Status: DC | PRN
  Filled 2022-12-30: qty 100

## 2022-12-30 MED ORDER — POTASSIUM CHLORIDE 20 MEQ PO PACK: 60.0000 meq | PACK | Freq: Once | ORAL | Status: DC

## 2022-12-30 MED ORDER — MAGNESIUM SULFATE 2 GM/50ML IV SOLN
2.0000 g | Freq: Once | INTRAVENOUS | Status: AC
  Administered 2022-12-30: 2 g via INTRAVENOUS
  Filled 2022-12-30: qty 50

## 2022-12-30 MED ORDER — POTASSIUM CHLORIDE 20 MEQ PO PACK
40.0000 meq | PACK | Freq: Once | ORAL | Status: AC
  Administered 2022-12-30: 40 meq via ORAL
  Filled 2022-12-30: qty 2

## 2022-12-30 MED ORDER — GERHARDT'S BUTT CREAM
1.0000 | TOPICAL_CREAM | Freq: Three times a day (TID) | CUTANEOUS | 0 refills | Status: DC
  Filled 2022-12-30: qty 1, 30d supply, fill #0

## 2022-12-30 MED ORDER — K PHOS MONO-SOD PHOS DI & MONO 155-852-130 MG PO TABS
250.0000 mg | ORAL_TABLET | Freq: Two times a day (BID) | ORAL | Status: DC
  Administered 2022-12-30: 250 mg via ORAL
  Filled 2022-12-30 (×2): qty 1

## 2022-12-30 MED ORDER — THIAMINE HCL 100 MG PO TABS
100.0000 mg | ORAL_TABLET | Freq: Every day | ORAL | 0 refills | Status: DC
  Filled 2022-12-30: qty 30, 30d supply, fill #0

## 2022-12-30 MED ORDER — CALCIUM GLUCONATE-NACL 1-0.675 GM/50ML-% IV SOLN
1.0000 g | Freq: Once | INTRAVENOUS | Status: AC
  Administered 2022-12-30: 1000 mg via INTRAVENOUS
  Filled 2022-12-30: qty 50

## 2022-12-30 NOTE — Discharge Summary (Signed)
Name: Christina Byrd MRN: [id number] DOB: Apr 14, 1965 57 y.o. PCP: [name], DO  Date of Admission: 12/23/2022  3:48 PM Date of Discharge:  12/30/2022 Attending Physician: Dr. Heide Spark  DISCHARGE DIAGNOSIS:  Primary Problem: Shock liver   Hospital Problems: Principal Problem:   Shock liver Active Problems:   Dysphagia   Anemia due to chronic blood loss   Alcohol related seizure (HCC)   Nausea & vomiting   Nausea and vomiting   Electrolyte abnormality   Malnutrition of moderate degree   Hepatic cirrhosis (HCC)   Alcohol use   Abnormal LFTs   Chronic diarrhea   Heme positive stool    DISCHARGE MEDICATIONS:   Allergies as of 12/30/2022       Reactions   Losartan Potassium    Rash   Levaquin [levofloxacin In D5w] Rash   Ace Inhibitors Cough   REACTION: cough   Codeine Nausea Only   Cefepime Rash   09/04/12 pm Patient started to break out with small macules after IV Vanco infusion, then macules increased in size after starting cefepime infusion.   Chlorhexidine Itching, Rash   Vancomycin Rash   09/04/12 pm Patient started to break out with small macules after IV Vanco infusion, then macules increased in size after starting cefepime infusion.        Medication List     STOP taking these medications    atorvastatin 20 MG tablet Commonly known as: LIPITOR   ondansetron 4 MG disintegrating tablet Commonly known as: ZOFRAN-ODT       TAKE these medications    allopurinol 100 MG tablet Commonly known as: ZYLOPRIM Take 100 mg by mouth daily. Pt takes 1 tablet daily.   CertaVite/Antioxidants Tabs Take 1 tablet by mouth daily. Start taking on: December 31, 2022   colchicine 0.6 MG tablet Take 0.6 mg by mouth daily.   feeding supplement Liqd Take 237 mLs by mouth 3 (three) times daily between meals.   fluticasone 50 MCG/ACT nasal spray Commonly known as: FLONASE Place 2 sprays into both nostrils daily. Start taking on: December 31, 2022    folic acid 1 MG tablet Commonly known as: FOLVITE Take 1 tablet (1 mg total) by mouth daily.   Gerhardt's butt cream Crea Apply 1 Application topically 4 (four) times daily -  before meals and at bedtime.   levETIRAcetam 750 MG tablet Commonly known as: KEPPRA Take 1 tablet (750 mg total) by mouth 2 (two) times daily.   loperamide 2 MG capsule Commonly known as: IMODIUM Take 1 capsule (2 mg total) by mouth 4 (four) times daily as needed for diarrhea or loose stools.   pantoprazole 40 MG tablet Commonly known as: Protonix Take 1 tablet (40 mg total) by mouth daily.   thiamine 100 MG tablet Commonly known as: VITAMIN B1 Take 1 tablet (100 mg total) by mouth daily.   Vitamin D (Ergocalciferol) 1.25 MG (50000 UNIT) Caps capsule Commonly known as: DRISDOL Take 1 capsule by mouth once a week. Every Wednesday starting 11/13   Zinc Oxide 12.8 % ointment Commonly known as: TRIPLE PASTE Apply topically as needed for irritation. In your buttocks               Discharge Care Instructions  (From admission, onward)           Start     Ordered   12/30/22 0000  Discharge wound care:       Comments: Apply butt cream and zinc oxide as needed  12/30/22 1538            DISPOSITION AND FOLLOW-UP:  Christina Byrd was discharged from [facility] in Stable condition. At the hospital follow up visit please address:  Follow-up Recommendations: Consults: will need to follow up with GI  Labs: CBC and Comprehensive Metabolic Panel Vitamin D levels Medications: consider d'cing loperamide if no more diarrhea may need maintenance Vitamin D levels  Follow-up Appointments:  Follow-up Information     [facility] Follow up.   Specialty: Rehabilitation Why: they will call in the next week to schedule a therapy appointment for PT and OT Contact information: [address] [place]  [place] [address] [phone]        Kathleen Lime, MD Follow up.   Specialty: Internal Medicine Why: 11/26 at 2:15 pm Contact information: [address] [place] [address] [phone]         [facility] Follow up.   Why: For lab appointment on 11/11 at 10AM Contact information: [address] [place] [address] [phone]                HOSPITAL COURSE:  Patient Summary: #Hypotension #Nausea and Vomiting  #Diarrhea Christina Byrd is a 57 y.o. with a PMH of alcohol use do, CKD stage 3b, GERD, Seizure do, angiodysplasia of the stomach and duodenum, duodenal ulcers, hematemesis and melena 2/2 to NSAID and alcohol use presenting with as a direct admission after the clinic due to hypotension. She had been having acute onset nausea, vomiting, and an ongoing diarrhea that had been present since her discharge from her prior admission on 11/18/2025. Her baseline hemoglobin was around 8-10 but presented with a hemoglobin of 5.7-6.0. She received 2 units of pRBCs and 5L of LR. Her blood pressures have remained soft during her admission. SBP at discharge is 105.   Her diarrhea resolved during her admission with the addition of loperamide after stool panel was negative and she tested negative for C diff. She took it once and her symptoms resolved. She is being discharged on Loperamide PRN.    -Please make sure to follow blood pressure in clinic  -Please ensure resolution of nausea and vomiting  -Consider referral to GI for colonoscopy and chronic diarrhea follow-up  -please ensure patient knows to stop taking loperamide if her diarrhea has resolved   #Dysphagia She endorsed dysphagia and globus sensation present for the last three months, worsening with solids. She has a history of heavy alcohol use as well as cocaine use. There was concern for a malignancy or stricture. An EGD was done which was unremarkable. An MBS was unable to  be completed due to poor patient cooperation. SLP assessed her the day of her discharge again and recommended to proceed with a regular diet. Throughout her hospitalization she was able to tolerate some pills and tablets depending on the day, as well as different foods if she was willing to eat them. She was started on flonase as postnasal drip may contribute to a globus sensation.   -Cw flonase  -Cw Protonix 40mg  daily   #Hypomagnesemia  #Hypokalemia  #Hypophosphatemia  Repleted during her hospitalization aggressively. Likely due to N/V and diarrhea. She had very little PO intake during her hospitalization. Was able to tolerate a regular diet on day of discharge. Has FU lab appointment to re-check these on 01/03/2023  -Consider RFP, Mag, and Phosphorus check  #Anemia [facility]  Christina Byrd was unfortunately was not able to complete the bowel prep for her colonoscopy. Her hemoglobin at the time of discharge was stable at 7.8.   -Please refer to GI for colonoscopy  -Please consider FU CBC   #AKI on CKD stage 3b - Resolved Cr at admission was 4.5, her baseline was 0.9-1.3 in late September. This resolved during her admission and was at baseline (0.89) at time of discharge.   -Please follow up with RFP    #Vitamin D Deficiency #Hypocalcemia  Has hypocalcemia with prolonged QT, improving with calcium gluconate replacement. She is deficient on Vitamin D supplementation. Vitamin D levels were 14. Discharged on 50K units weekly for 7 weeks (got one dose while admitted).  -Repeat Vitamin D levels in 7 weeks  -Consider maintenance dose thereafter  -PTH pending (has been low in the past- possibly contributing to low phosphorus).   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 This was measured in the setting of illness.  -FU TSH outpatient   #Perianal ulceration No signs of erythema or purulence today. Started likely due to diarrhea. HSV and RPR  negative.  -cw Gerhards butt cream -cw Zinc Oxide cream    #History of Gout - Can resume home allopurinol and colchicine now (held due to her AKI while admitted).   #Transaminitis The day after she received one dose of loperamide, her AST levels increased to the 800s from 187. She also had had 6 bouts of diarrhea overnight. ALK phos was WNL, T bili was not elevated. [place] RUQ concerning for cholecystitis. HIDA was negative. CT scan negative for acute process. Anti-mitochondrial antibodies were negative, CK was negative. These continued  to improve during her hospitalization but remained elevated. Atorvastatin has been held.    -FU LFTs  -Consider restarting atorvastatin if appropriate      DISCHARGE INSTRUCTIONS:   Discharge Instructions     Call MD for:  difficulty breathing, headache or visual disturbances   Complete by: As directed    Call MD for:  extreme fatigue   Complete by: As directed    Call MD for:  hives   Complete by: As directed    Call MD for:  persistant dizziness or light-headedness   Complete by: As directed    Call MD for:  persistant nausea and vomiting   Complete by: As directed    Call MD for:  redness, tenderness, or signs of infection (pain, swelling, redness, odor or green/yellow discharge around incision site)   Complete by: As directed    Call MD for:  severe uncontrolled pain   Complete by: As directed    Call MD for:  temperature >100.4   Complete by: As directed    Diet - low sodium heart healthy   Complete by: As directed    Discharge instructions   Complete by: As directed    Christina Byrd,   You were in the hospital because you presented with low blood pressure, nausea, vomiting, diarrhea, and inability to swallow foods.   You received IV fluids, and 2 units of blood. Your blood pressure has been stable since.   Your diarrhea improved with loperamide (Imodium) once we confirmed that your diarrhea was not due to having an active infection. Please  take this medication as needed every four hours until you follow with the stomach doctor Psychologist, sport and exercise). Stop this medication if your diarrhea resolves. You will need to follow with [place] in clinic on November  26th at 2:15 pm.    Sometimes having the sensation that something gets stuck in our throat can be due to allergies. Please use flonase 1 puff into each nostril, which should help you with the sensation that you have in your throat when swallowing.  You do have an anemia that is thought to be likely related to a bleed. You will need a colonoscopy. Please ensure you get a referral to the stomach doctor when you follow with [place] in clinic.  Your magnesium, calcium, potassium, and phosphorus were very low when you came in. This is likely because you were vomiting and having so much diarrhea. You got these replaced, but these can come back down in the setting of eating again after not eating for a long time. You were able to tolerate your food today, but it has been six days since you were able to keep some food down. For this, you will need to follow up with [place] for a lab visit only next week on Monday so we can recheck your labs. These have been ordered. Your appointment is on Monday 11/11 at 10AM.   Lastly, you are deficient in Vitamin D. You will need to take one tablet of vitamin D every Wednesday starting next week. Please put this as a reminder in your phone as Vitamin D deficiencies can lead to low calcium and fractures. You will need your Vitamin D levels re-checked again after you are done taking this high dose vitamin D and may need to go on longer term supplementation.  Please also take thiamine 100mg  daily as this protects your brain in the setting of alcohol use. You are also being discharged on a Multivitamin.   For your buttock:  Please monitor for signs of increased pain, redness, pus, foul discharge, increased warmth or fevers. Please call our office immediately should you notice this  happening. Apply Butt cream and Zinc Oxide cream as instructed in their respective labels.   Please do not hesitate to contact us in the clinic should you have any questions. Our phone number is [phone]   Discharge wound care:   Complete by: As directed    Apply butt cream and zinc oxide as needed   Increase activity slowly   Complete by: As directed       SUBJECTIVE:   Doing well. Wondering when she will be going home.   Discharge Vitals:   BP 119/73 (BP Location: Right Arm)   Pulse 89   Temp 98.4 F (36.9 C) (Oral)   Resp 16   Ht 5\' 2"  (1.575 m)   Wt 63.1 kg   LMP 08/23/2011   SpO2 100%   BMI 25.44 kg/m   OBJECTIVE:  Physical Exam Constitutional:      General: She is not in acute distress.    Appearance: Normal appearance. She is not ill-appearing or toxic-appearing.  HENT:     Mouth/Throat:     Mouth: Mucous membranes are moist.  Eyes:     General: No scleral icterus. Cardiovascular:     Rate and Rhythm: Normal rate and regular rhythm.     Heart sounds: No murmur heard.    No friction rub. No gallop.  Pulmonary:     Effort: Pulmonary effort is normal. No respiratory distress.     Breath sounds: No wheezing, rhonchi or rales.  Abdominal:     General: Abdomen is flat. Bowel sounds are normal. There is no distension.     Palpations: Abdomen is soft.  Tenderness: There is no abdominal tenderness. There is no guarding or rebound.  Musculoskeletal:     Right lower leg: No edema.     Left lower leg: No edema.  Neurological:     Mental Status: She is alert.     Pertinent Labs, Studies, and Procedures:     Latest Ref Rng & Units 12/30/2022    4:58 AM 12/28/2022    7:33 AM 12/27/2022    4:42 AM  CBC  WBC 4.0 - 10.5 K/uL 5.8  5.5  5.0   Hemoglobin 12.0 - 15.0 g/dL 7.8  8.3  8.3   Hematocrit 36.0 - 46.0 % 22.7  24.9  24.3   Platelets 150 - 400 K/uL 203  164  141        Latest Ref Rng & Units 12/30/2022    4:58 AM 12/29/2022    5:16 AM 12/28/2022     7:33 AM  CMP  Glucose 70 - 99 mg/dL 94  75  82   BUN 6 - 20 mg/dL 6  5  6    Creatinine 0.44 - 1.00 mg/dL 1.61  0.96  0.45   Sodium 135 - 145 mmol/Christina 140  139  142   Potassium 3.5 - 5.1 mmol/Christina 3.4  3.7  3.7   Chloride 98 - 111 mmol/Christina 114  111  115   CO2 22 - 32 mmol/Christina 21  19  20    Calcium 8.9 - 10.3 mg/dL 6.8  6.1  6.0   Total Protein 6.5 - 8.1 g/dL 4.3  4.4  4.5   Total Bilirubin <1.2 mg/dL 0.5  0.7  0.6   Alkaline Phos 38 - 126 U/Christina 67  60  63   AST 15 - 41 U/Christina 63  131  276   ALT 0 - 44 U/Christina 67  91  121     ECHOCARDIOGRAM COMPLETE  Result Date: 12/24/2022    ECHOCARDIOGRAM REPORT   Patient Name:   Christina Byrd Horgan Date of Exam: 12/24/2022 Medical Rec #:  [id number]          Height:       62.0 in Accession #:    [id number]         Weight:       115.3 lb Date of Birth:  20-Apr-1965          BSA:          1.513 m Patient Age:    57 years           BP:           88/47 mmHg Patient Gender: F                  HR:           92 bpm. Exam Location:  Inpatient Procedure: 2D Echo, Color Doppler and Cardiac Doppler STAT ECHO Indications:    R00.8 Other abnormalities of heart beat  History:        Patient has prior history of Echocardiogram examinations, most                 recent 05/02/2019. Risk Factors:Dyslipidemia and Former Smoker.                 ETOH. Cocaine use.  Sonographer:    Sheralyn Boatman RDCS Referring Phys: [id number] JEFFREY C HATCHER  Sonographer Comments: Image acquisition challenging due to uncooperative patient. Patient moved throughout study. Patient unable to hold respirations. IMPRESSIONS  1. Left  ventricular ejection fraction, by estimation, is 60 to 65%. The left ventricle has normal function. The left ventricle has no regional wall motion abnormalities. Left ventricular diastolic parameters were normal.  2. Right ventricular systolic function is normal. The right ventricular size is normal.  3. The mitral valve is normal in structure. Trivial mitral valve regurgitation.  4. The aortic valve is  tricuspid. Aortic valve regurgitation is not visualized.  5. The inferior vena cava is normal in size with greater than 50% respiratory variability, suggesting right atrial pressure of 3 mmHg. Comparison(s): The left ventricular function is unchanged. FINDINGS  Left Ventricle: Left ventricular ejection fraction, by estimation, is 60 to 65%. The left ventricle has normal function. The left ventricle has no regional wall motion abnormalities. The left ventricular internal cavity size was normal in size. There is  no left ventricular hypertrophy. Left ventricular diastolic parameters were normal. Right Ventricle: The right ventricular size is normal. Right vetricular wall thickness was not assessed. Right ventricular systolic function is normal. Left Atrium: Left atrial size was normal in size. Right Atrium: Right atrial size was normal in size. Pericardium: There is no evidence of pericardial effusion. Mitral Valve: The mitral valve is normal in structure. Trivial mitral valve regurgitation. Tricuspid Valve: The tricuspid valve is normal in structure. Tricuspid valve regurgitation is trivial. Aortic Valve: The aortic valve is tricuspid. Aortic valve regurgitation is not visualized. Pulmonic Valve: The pulmonic valve was grossly normal. Pulmonic valve regurgitation is not visualized. Aorta: The aortic root and ascending aorta are structurally normal, with no evidence of dilitation. Venous: The inferior vena cava is normal in size with greater than 50% respiratory variability, suggesting right atrial pressure of 3 mmHg. IAS/Shunts: No atrial level shunt detected by color flow Doppler.  LEFT VENTRICLE PLAX 2D LVIDd:         3.50 cm     Diastology LVIDs:         2.20 cm     LV e' medial:    7.29 cm/s LV PW:         1.00 cm     LV E/e' medial:  11.5 LV IVS:        1.10 cm     LV e' lateral:   13.10 cm/s LVOT diam:     2.20 cm     LV E/e' lateral: 6.4 LV SV:         89 LV SV Index:   59 LVOT Area:     3.80 cm  LV Volumes  (MOD) LV vol d, MOD A2C: 75.1 ml LV vol d, MOD A4C: 56.1 ml LV vol s, MOD A2C: 26.5 ml LV vol s, MOD A4C: 21.5 ml LV SV MOD A2C:     48.6 ml LV SV MOD A4C:     56.1 ml LV SV MOD BP:      41.8 ml RIGHT VENTRICLE             IVC RV S prime:     13.50 cm/s  IVC diam: 1.90 cm TAPSE (M-mode): 2.0 cm LEFT ATRIUM           Index        RIGHT ATRIUM          Index LA diam:      3.20 cm 2.12 cm/m   RA Area:     8.89 cm LA Vol (A2C): 15.8 ml 10.44 ml/m  RA Volume:   17.30 ml 11.44 ml/m LA Vol (A4C): 25.8 ml  17.06 ml/m  AORTIC VALVE LVOT Vmax:   126.00 cm/s LVOT Vmean:  81.200 cm/s LVOT VTI:    0.233 m  AORTA Ao Root diam: 3.20 cm Ao Asc diam:  3.10 cm MITRAL VALVE MV Area (PHT): 3.72 cm    SHUNTS MV Decel Time: 204 msec    Systemic VTI:  0.23 m MV E velocity: 84.00 cm/s  Systemic Diam: 2.20 cm MV A velocity: 89.50 cm/s MV E/A ratio:  0.94 Dietrich Pates MD Electronically signed by Dietrich Pates MD Signature Date/Time: 12/24/2022/12:08:59 PM    Final    US RENAL  Result Date: 12/24/2022 CLINICAL DATA:  Acute kidney injury EXAM: RENAL / URINARY TRACT ULTRASOUND COMPLETE COMPARISON:  No prior dedicated renal ultrasound available FINDINGS: Right Kidney: Renal measurements: 8.1 x 3.9 x 4.5 cm = volume: 75 mL. Echogenicity within normal limits. No mass or hydronephrosis visualized. Left Kidney: Renal measurements: 9.8 x 5.1 x 4.8 cm = volume: 125 mL. Echogenicity within normal limits. No mass or hydronephrosis visualized. Bladder: Appears normal for degree of bladder distention. Other: None. IMPRESSION: No hydronephrosis. Electronically Signed   By: Wiliam Ke M.D.   On: 12/24/2022 11:24   DG CHEST PORT 1 VIEW  Result Date: 12/23/2022 CLINICAL DATA:  Hypoxemia. EXAM: PORTABLE CHEST 1 VIEW COMPARISON:  Chest radiograph dated 11/20/2022. FINDINGS: No focal consolidation, pleural effusion, or pneumothorax. The cardiac silhouette is within normal limits. No acute osseous pathology. IMPRESSION: No active disease.  Electronically Signed   By: Elgie Collard M.D.   On: 12/23/2022 21:19     Signed: Manuela Neptune, MD Internal Medicine Resident, PGY-1 [facility]  Pager: [phone] 3:38 PM, 12/30/2022

## 2022-12-30 NOTE — Progress Notes (Signed)
Mobility Specialist Progress Note:   12/30/22 1120  Mobility  Activity Ambulated with assistance in hallway  Level of Assistance Standby assist, set-up cues, supervision of patient - no hands on  Assistive Device None  Distance Ambulated (ft) 470 ft  Activity Response Tolerated well  Mobility Referral Yes  $Mobility charge 1 Mobility  Mobility Specialist Start Time (ACUTE ONLY) 1120  Mobility Specialist Stop Time (ACUTE ONLY) 1132  Mobility Specialist Time Calculation (min) (ACUTE ONLY) 12 min   Pt agreeable to mobility session. Required no physical assistance throughout ambulation. Back in bed with all needs met.  Addison Lank Mobility Specialist Please contact via SecureChat or  Rehab office at [phone]

## 2022-12-30 NOTE — Progress Notes (Signed)
Speech Language Pathology Treatment: Dysphagia  Patient Details Name: Christina Byrd MRN: [id number] DOB: 15-Mar-1965 Today's Date: 12/30/2022 Time: 2952-8413 SLP Time Calculation (min) (ACUTE ONLY): 13 min  Assessment / Plan / Recommendation Clinical Impression  Pt seen for ongoing dysphagia management.  She is eager to advance to regular texture diet.  Pt asleep on SLP arrival but able to rouse.  She initially refused PO trials, but agreed to participate in reassessment with encouragement.  Pt consumed plain graham cracker and graham crackers with peanut butter.  She demonstrated adequate oral clearance.  Pt with slightly prolonged oral phase 2/2 edentulism. Recommended choosing softer foods as needed from menu for ease of intake, but pt is safe to advance diet. SLP will sign off.   Recommend regular texture diet with thin liquid.     HPI HPI: Patient is a 57 y.o. female with PMH: ETOH use disorder, CKD stage 3b, GERD, seizure disorder, angiodysplasia of the stomach and duodenum, duodenal ulcers, hematemesis and melena secondary to NSAID and ETOH use. She presented to the hospital on 12/23/2022 from clinic due to hypotension. Patient reported ongoing episodes of dysphagia, diarrhea (dark colored stool) for past three months. GI consulted and as stoll was heme-negative and no report of active or recent GI blood loss, recommended twice daily PPI but no further intervention at this time. EGD on 06/03/2022,that showed - Normal portions of the nasopharynx, oropharynx and larynx and esophagus. There was clotted blood in the gastric fundus and a single bleeding angioectasia in the stomach that was treated with argon plasma coagulation. CXR without active disease. Patient has an order for an OP MBS from 11/05/22 but per notes, scheduler was not able to get ahold of patient on phone to schedule.      SLP Plan  All goals met      Recommendations for follow up therapy are one component of a  multi-disciplinary discharge planning process, led by the attending physician.  Recommendations may be updated based on patient status, additional functional criteria and insurance authorization.    Recommendations  Diet recommendations: Regular;Thin liquid Medication Administration:  (As tolerated) Supervision: Patient able to self feed Compensations: Small sips/bites;Slow rate Postural Changes and/or Swallow Maneuvers: Seated upright 90 degrees                  Oral care BID   None Dysphagia, pharyngoesophageal phase (R13.14)     All goals met     Kerrie Pleasure, MA, CCC-SLP Acute Rehabilitation Services Office: [phone] 12/30/2022, 10:36 AM

## 2022-12-30 NOTE — Plan of Care (Signed)

## 2022-12-30 NOTE — Progress Notes (Signed)
OT Cancellation Note  Patient Details Name: Christina Byrd MRN: [id number] DOB: 06-03-1965   Cancelled Treatment:    Reason Eval/Treat Not Completed: Fatigue/lethargy limiting ability to participate pt sleeping deeply on OT entry and did not awaken to voice. Will follow up again for OT session as schedule permits.  Lorre Munroe 12/30/2022, 1:38 PM

## 2022-12-30 NOTE — Discharge Instructions (Addendum)
[name], Christina Byrd were in the hospital because you presented with low blood pressure, nausea, vomiting, diarrhea, and inability to swallow foods.   You received IV fluids, and 2 units of blood. Your blood pressure has been stable since.   Your diarrhea improved with loperamide (Imodium) once we confirmed that your diarrhea was not due to having an active infection. Please take this medication as needed every four hours until you follow with the stomach doctor Psychologist, sport and exercise). Stop this medication if your diarrhea resolves. You will need to follow with [place] in clinic on November 26th at 2:15 pm.    Sometimes having the sensation that something gets stuck in our throat can be due to allergies. Please use flonase 1 puff into each nostril, which should help you with the sensation that you have in your throat when swallowing.  You do have an anemia that is thought to be likely related to a bleed. You will need a colonoscopy. Please ensure you get a referral to the stomach doctor when you follow with [place] in clinic.  Your magnesium, calcium, potassium, and phosphorus were very low when you came in. This is likely because you were vomiting and having so much diarrhea. You got these replaced, but these can come back down in the setting of eating again after not eating for a long time. You were able to tolerate your food today, but it has been six days since you were able to keep some food down. For this, you will need to follow up with [place] for a lab visit only next week on Monday so we can recheck your labs. These have been ordered. Your appointment is on Monday 11/11 at 10AM.   Lastly, you are deficient in Vitamin D. You will need to take one tablet of vitamin D every Wednesday starting next week. Please put this as a reminder in your phone as Vitamin D deficiencies can lead to low calcium and fractures. You will need your Vitamin D levels re-checked again after you are done taking this high dose vitamin D  and may need to go on longer term supplementation.  Please also take thiamine 100mg  daily as this protects your brain in the setting of alcohol use. You are also being discharged on a Multivitamin.   For your buttock:  Please monitor for signs of increased pain, redness, pus, foul discharge, increased warmth or fevers. Please call our office immediately should you notice this happening. Apply Butt cream and Zinc Oxide cream as instructed in their respective labels.   Please do not hesitate to contact us in the clinic should you have any questions. Our phone number is [phone]

## 2022-12-30 NOTE — Progress Notes (Signed)
Nutrition Follow-up  DOCUMENTATION CODES:   Non-severe (moderate) malnutrition in context of social or environmental circumstances  INTERVENTION:   -Continue regular diet per SLP recommendation, please encourage intakes.  -Continue Ensure Enlive po BID, each supplement provides 350 kcal and 20 grams of protein.   NUTRITION DIAGNOSIS:   Moderate Malnutrition related to social / environmental circumstances, inability to eat, diarrhea, altered GI function, dysphagia as evidenced by mild muscle depletion, moderate muscle depletion, energy intake < or equal to 50% for > or equal to 1 month, per patient/family report.  -addressing with regular diet and oral supplements.   GOAL:   Patient will meet greater than or equal to 90% of their needs  -anticipate improved PO intakes with diet upgrade to regular   MONITOR:   PO intake, Weight trends, Supplement acceptance, Skin, I & O's, Labs  REASON FOR ASSESSMENT:   Follow up  ASSESSMENT:   Pt with a PMH of alcohol use do, CKD stage 3b, GERD, Seizure do, angiodysplasia of the stomach and duodenum, duodenal ulcers, hematemesis and melena 2/2 to NSAID and alcohol use presenting due to hypotension.  1/2-MBSS, unabe to be completed in entirety due to patient non-compliance. SLP recommends dysphagia II.  11/3-GI consult 11/4-possible repeat MBSS 11/6-EGD-unremarkable, planned colonoscopy not done due to patient would not drink the contrast 11/7-repeat SLP swallow evaluation, diet advancement to regular.  Intakes are not being recorded, patient insists she will eat her L meal of sandwich. Denies any nausea, vomiting or diarrhea.  Patient has been refusing to take her vitamins.  Medications reviewed and include folic acid 1 mg daily, creon 72,000 units 3x daily with meals, MVI/Minerals-1 Tab daily, PPI, phosphorus 250 mg 2x daily, thiamine 100 mg daily, vitamin D 1.25 mcg every 7 days.  Labs: potassium 3.4, calcium 6.8, Hgb 7.8, Hct  22.7   Intake/Output Summary (Last 24 hours) at 12/30/2022 1505 Last data filed at 12/29/2022 2300 Gross per 24 hour  Intake 1080 ml  Output --  Net 1080 ml   Net IO Since Admission: 5,975.29 mL [12/30/22 1505]   Diet Order:   Diet Order             Diet regular Fluid consistency: Thin  Diet effective now                   EDUCATION NEEDS:   Education needs have been addressed  Skin:  Skin Assessment: Skin Integrity Issues: Skin Integrity Issues:: Other (Comment) Other: moisture related skin damage intergluteal folds  Last BM:  11/3, type 6-small  Height:   Ht Readings from Last 1 Encounters:  12/29/22 5\' 2"  (1.575 m)    Weight:   Wt Readings from Last 1 Encounters:  12/30/22 63.1 kg    Ideal Body Weight:  50 kg  BMI:  Body mass index is 25.44 kg/m.  Estimated Nutritional Needs:   Kcal:  1650-1850  Protein:  85-100 grams  Fluid:  > 1.6 L    Alvino Chapel, RDLD Clinical Dietitian See AMION for contact information

## 2022-12-31 ENCOUNTER — Other Ambulatory Visit: Payer: Self-pay | Admitting: Student

## 2022-12-31 ENCOUNTER — Encounter (HOSPITAL_COMMUNITY): Payer: Self-pay | Admitting: Gastroenterology

## 2022-12-31 ENCOUNTER — Telehealth: Payer: Self-pay

## 2022-12-31 DIAGNOSIS — K922 Gastrointestinal hemorrhage, unspecified: Secondary | ICD-10-CM

## 2022-12-31 DIAGNOSIS — K529 Noninfective gastroenteritis and colitis, unspecified: Secondary | ICD-10-CM

## 2022-12-31 NOTE — Transitions of Care (Post Inpatient/ED Visit) (Signed)
12/31/2022  Name: Christina Byrd MRN: [id number] DOB: [birth year]  Today's TOC FU Call Status: Today's TOC FU Call Status:: Successful TOC FU Call Completed TOC FU Call Complete Date: 12/31/22 Patient's Name and Date of Birth confirmed.  Transition Care Management Follow-up Telephone Call Date of Discharge: 12/30/22 Discharge Facility: [facility]) Type of Discharge: Inpatient Admission Primary Inpatient Discharge Diagnosis:: nausea vomiting How have you been since you were released from the hospital?: Better Any questions or concerns?: No  Items Reviewed: Did you receive and understand the discharge instructions provided?: Yes Medications obtained,verified, and reconciled?: Yes (Medications Reviewed) Any new allergies since your discharge?: No Dietary orders reviewed?: Yes Do you have support at home?: Yes People in Home: spouse  Medications Reviewed Today: Medications Reviewed Today     Reviewed by Karena Addison, LPN (Licensed Practical Nurse) on 12/31/22 at 1003  Med List Status: <None>   Medication Order Taking? Sig Documenting Provider Last Dose Status Informant  allopurinol (ZYLOPRIM) 100 MG tablet [id number] No Take 100 mg by mouth daily. Pt takes 1 tablet daily. [provider] Past Week Active Self, Spouse/Significant Other  colchicine 0.6 MG tablet [id number] No Take 0.6 mg by mouth daily. [provider] Past Week Active Spouse/Significant Other, Self  ergocalciferol (VITAMIN D2) 1.25 MG (50000 UT) capsule [id number]  Take 1 capsule by mouth once a week. Every Wednesday starting 11/13 Gwenevere Abbot, MD  Active   feeding supplement (ENSURE ENLIVE / ENSURE PLUS) LIQD [id number]  Take 237 mLs by mouth 3 (three) times daily between meals. Gwenevere Abbot, MD  Active   fluticasone [facility]) 50 MCG/ACT nasal spray [id number]  Place 2 sprays into both nostrils daily. Gwenevere Abbot, MD  Active   folic acid (FOLVITE) 1 MG tablet [id number] No Take 1 tablet (1  mg total) by mouth daily. Rhetta Mura, MD Past Week Active Self, Spouse/Significant Other  levETIRAcetam (KEPPRA) 750 MG tablet [id number] No Take 1 tablet (750 mg total) by mouth 2 (two) times daily. Rhetta Mura, MD Past Week Active Self, Spouse/Significant Other  loperamide (IMODIUM) 2 MG capsule [id number]  Take 1 capsule (2 mg total) by mouth 4 (four) times daily as needed for diarrhea or loose stools. Gwenevere Abbot, MD  Active   Discontinued 04/02/20 1650 (Completed Course)   Multiple Vitamin (MULTIVITAMIN WITH MINERALS) TABS tablet [id number]  Take 1 tablet by mouth daily. Gwenevere Abbot, MD  Active   Nystatin (GERHARDT'S BUTT CREAM) CREA [id number]  Apply 1 Application topically 4 (four) times daily -  before meals and at bedtime. Gwenevere Abbot, MD  Active   pantoprazole (PROTONIX) 40 MG tablet [id number] No Take 1 tablet (40 mg total) by mouth daily. Nooruddin, Jason Fila, MD Past Week Active Self, Spouse/Significant Other  thiamine (VITAMIN B1) 100 MG tablet [id number]  Take 1 tablet (100 mg total) by mouth daily. Gwenevere Abbot, MD  Active   Zinc Oxide (TRIPLE PASTE) 12.8 % ointment [id number]  Apply topically as needed for irritation. In your buttocks Gwenevere Abbot, MD  Active             Home Care and Equipment/Supplies: Were Home Health Services Ordered?: NA Any new equipment or medical supplies ordered?: NA  Functional Questionnaire: Do you need assistance with bathing/showering or dressing?: No Do you need assistance with meal preparation?: No Do you need assistance with eating?: No Do you have difficulty maintaining continence: No Do you need assistance with getting out of bed/getting out of a chair/moving?: No Do you have difficulty managing  or taking your medications?: No  Follow up appointments reviewed: PCP Follow-up appointment confirmed?: Yes Date of PCP follow-up appointment?: 01/18/23 Follow-up Provider: [facility] Follow-up appointment  confirmed?: NA Do you need transportation to your follow-up appointment?: No Do you understand care options if your condition(s) worsen?: Yes-patient verbalized understanding    SIGNATURE Karena Addison, LPN [facility] Nurse Health Advisor Direct Dial [phone]

## 2022-12-31 NOTE — Progress Notes (Signed)
Ordering CMP, Mag, and CBC for hospital follow-up

## 2023-01-02 NOTE — Addendum Note (Signed)
Addended by: Manuela Neptune on: 01/02/2023 03:55 PM   Modules accepted: Orders

## 2023-01-03 ENCOUNTER — Other Ambulatory Visit: Payer: BLUE CROSS/BLUE SHIELD

## 2023-01-05 ENCOUNTER — Other Ambulatory Visit: Payer: Self-pay

## 2023-01-05 ENCOUNTER — Ambulatory Visit: Payer: BLUE CROSS/BLUE SHIELD | Admitting: Internal Medicine

## 2023-01-05 ENCOUNTER — Encounter: Payer: Self-pay | Admitting: Internal Medicine

## 2023-01-05 VITALS — BP 105/77 | HR 75 | Temp 98.2°F | Ht 62.0 in | Wt 132.0 lb

## 2023-01-05 DIAGNOSIS — D539 Nutritional anemia, unspecified: Secondary | ICD-10-CM

## 2023-01-05 DIAGNOSIS — E559 Vitamin D deficiency, unspecified: Secondary | ICD-10-CM | POA: Diagnosis not present

## 2023-01-05 DIAGNOSIS — D500 Iron deficiency anemia secondary to blood loss (chronic): Secondary | ICD-10-CM

## 2023-01-05 DIAGNOSIS — E039 Hypothyroidism, unspecified: Secondary | ICD-10-CM | POA: Diagnosis not present

## 2023-01-05 DIAGNOSIS — G40909 Epilepsy, unspecified, not intractable, without status epilepticus: Secondary | ICD-10-CM

## 2023-01-05 DIAGNOSIS — L989 Disorder of the skin and subcutaneous tissue, unspecified: Secondary | ICD-10-CM | POA: Diagnosis not present

## 2023-01-05 DIAGNOSIS — M1A0610 Idiopathic chronic gout, right knee, without tophus (tophi): Secondary | ICD-10-CM | POA: Diagnosis not present

## 2023-01-05 DIAGNOSIS — D649 Anemia, unspecified: Secondary | ICD-10-CM

## 2023-01-05 DIAGNOSIS — E785 Hyperlipidemia, unspecified: Secondary | ICD-10-CM

## 2023-01-05 DIAGNOSIS — M25512 Pain in left shoulder: Secondary | ICD-10-CM | POA: Diagnosis not present

## 2023-01-05 DIAGNOSIS — R7401 Elevation of levels of liver transaminase levels: Secondary | ICD-10-CM

## 2023-01-05 DIAGNOSIS — N183 Chronic kidney disease, stage 3 unspecified: Secondary | ICD-10-CM | POA: Diagnosis not present

## 2023-01-05 DIAGNOSIS — G8929 Other chronic pain: Secondary | ICD-10-CM

## 2023-01-05 DIAGNOSIS — R7989 Other specified abnormal findings of blood chemistry: Secondary | ICD-10-CM

## 2023-01-05 DIAGNOSIS — R234 Changes in skin texture: Secondary | ICD-10-CM

## 2023-01-05 DIAGNOSIS — R1319 Other dysphagia: Secondary | ICD-10-CM

## 2023-01-05 LAB — CBC
HCT: 28.6 % — ABNORMAL LOW (ref 36.0–46.0)
Hemoglobin: 8.9 g/dL — ABNORMAL LOW (ref 12.0–15.0)
MCH: 31.8 pg (ref 26.0–34.0)
MCHC: 31.1 g/dL (ref 30.0–36.0)
MCV: 102.1 fL — ABNORMAL HIGH (ref 80.0–100.0)
Platelets: 372 10*3/uL (ref 150–400)
RBC: 2.8 MIL/uL — ABNORMAL LOW (ref 3.87–5.11)
RDW: 18.6 % — ABNORMAL HIGH (ref 11.5–15.5)
WBC: 7.9 10*3/uL (ref 4.0–10.5)
nRBC: 0 % (ref 0.0–0.2)

## 2023-01-05 LAB — COMPREHENSIVE METABOLIC PANEL
ALT: 31 U/L (ref 0–44)
AST: 34 U/L (ref 15–41)
Albumin: 2.2 g/dL — ABNORMAL LOW (ref 3.5–5.0)
Alkaline Phosphatase: 89 U/L (ref 38–126)
Anion gap: 7 (ref 5–15)
BUN: 10 mg/dL (ref 6–20)
CO2: 18 mmol/L — ABNORMAL LOW (ref 22–32)
Calcium: 8.2 mg/dL — ABNORMAL LOW (ref 8.9–10.3)
Chloride: 113 mmol/L — ABNORMAL HIGH (ref 98–111)
Creatinine, Ser: 1.35 mg/dL — ABNORMAL HIGH (ref 0.44–1.00)
GFR, Estimated: 46 mL/min — ABNORMAL LOW (ref >60)
Glucose, Bld: 73 mg/dL (ref 70–99)
Potassium: 4.9 mmol/L (ref 3.5–5.1)
Sodium: 138 mmol/L (ref 135–145)
Total Bilirubin: 0.6 mg/dL (ref <1.2)
Total Protein: 5.7 g/dL — ABNORMAL LOW (ref 6.5–8.1)

## 2023-01-05 LAB — TSH: TSH: 9.202 u[IU]/mL — ABNORMAL HIGH (ref 0.350–4.500)

## 2023-01-05 MED ORDER — HYDROCORTISONE 1 % EX CREA: 1.0000 | TOPICAL_CREAM | Freq: Two times a day (BID) | CUTANEOUS | 0 refills | Status: DC

## 2023-01-05 NOTE — Progress Notes (Unsigned)
Subjective:  CC: HFU  HPI:  [name] is a 57 y.o. female with a past medical history of alcohol use disorder, seizure disorder, history of angiectasia and duodenal ulcer, presents today for hospital follow-up. She was direct admit from clinic on 10/31 in setting hypotension. Initial concern was that new dysphagia had lead to decreased PO intake causing hypotension. CBC also revealed hgb of <6. She was admitted from 10/31-11/08. During that time she received 2 units of blood and underwent EGD. EGD did not show source of bleed. Patient reported occasional dark stool but thought was associated with peptobismol use. She declined prep for colonoscopy so this was not completed during admission. Her dysphagia resolved on its own. She decline modified barium swallow and was cleared by speech therapy to resume regular diet.   She is unsure of home medication outside of keppra, which is is adherent with. Her partner attended visit with her today. Her main concern is about chronic shoulder pain and she requests meloxicam for this.  Please see problem based assessment and plan for additional details.  Past Medical History:  Diagnosis Date   GASTROESOPHAGEAL REFLUX, NO ESOPHAGITIS 04/21/2006   Qualifier: Diagnosis of  By: Levada Schilling     GERD (gastroesophageal reflux disease) Dx 1995   HTN (hypertension)    Hypotension 10/21/2022   Patient's blood pressure at presentation was 87/65, follow up blood pressure was 81/58. Patient reports regurgitating everything she has taken po for the past month. Denies lightheadedness or dizziness. Discussed GI referral, though patient stated she would be going to the emergency room today for IV fluids.   -GI referral for upper endoscopy placed  -Patient planning on going to the ED for IV flu   Intertrigo 06/22/2016   Seizures (HCC)     Current Outpatient Medications on File Prior to Visit  Medication Sig Dispense Refill   allopurinol (ZYLOPRIM) 100 MG  tablet Take 100 mg by mouth daily. Pt takes 1 tablet daily.     colchicine 0.6 MG tablet Take 0.6 mg by mouth daily.     ergocalciferol (VITAMIN D2) 1.25 MG (50000 UT) capsule Take 1 capsule by mouth once a week. Every Wednesday starting 11/13 7 capsule 0   feeding supplement (ENSURE ENLIVE / ENSURE PLUS) LIQD Take 237 mLs by mouth 3 (three) times daily between meals. 237 mL 12   fluticasone (FLONASE) 50 MCG/ACT nasal spray Place 2 sprays into both nostrils daily. 16 g 0   folic acid (FOLVITE) 1 MG tablet Take 1 tablet (1 mg total) by mouth daily. 30 tablet 2   levETIRAcetam (KEPPRA) 750 MG tablet Take 1 tablet (750 mg total) by mouth 2 (two) times daily. 60 tablet 2   Multiple Vitamin (MULTIVITAMIN WITH MINERALS) TABS tablet Take 1 tablet by mouth daily. 30 tablet 0   Nystatin (GERHARDT'S BUTT CREAM) CREA Apply 1 Application topically 4 (four) times daily -  before meals and at bedtime. 1 each 0   pantoprazole (PROTONIX) 40 MG tablet Take 1 tablet (40 mg total) by mouth daily. 90 tablet 3   thiamine (VITAMIN B1) 100 MG tablet Take 1 tablet (100 mg total) by mouth daily. 30 tablet 0   Zinc Oxide (TRIPLE PASTE) 12.8 % ointment Apply topically as needed for irritation. In your buttocks 56.7 g 0   [DISCONTINUED] metFORMIN (GLUCOPHAGE) 500 MG tablet Take 0.5 tablets (250 mg total) by mouth daily with breakfast. 45 tablet 6   No current facility-administered medications on file prior  to visit.    Family History  Problem Relation Age of Onset   CAD Mother     Social History   Socioeconomic History   Marital status: Single    Spouse name: Not on file   Number of children: Not on file   Years of education: Not on file   Highest education level: Not on file  Occupational History   Not on file  Tobacco Use   Smoking status: Former    Current packs/day: 0.00    Types: Cigarettes    Quit date: 06/25/2012    Years since quitting: 10.5   Smokeless tobacco: Never  Vaping Use   Vaping status:  Never Used  Substance and Sexual Activity   Alcohol use: Not Currently    Alcohol/week: 49.0 standard drinks of alcohol    Types: 14 Cans of beer, 35 Shots of liquor per week   Drug use: No   Sexual activity: Not Currently  Other Topics Concern   Not on file  Social History Narrative   Pt lives in single story home with her partner   Has 1 son   5 grandchildren   10th grade education   Works at [employer].    Right handed   Social Determinants of Health   Financial Resource Strain: Not on file  Food Insecurity: No Food Insecurity (12/23/2022)   Hunger Vital Sign    Worried About Running Out of Food in the Last Year: Never true    Ran Out of Food in the Last Year: Never true  Transportation Needs: No Transportation Needs (12/23/2022)   PRAPARE - Administrator, Civil Service (Medical): No    Lack of Transportation (Non-Medical): No  Physical Activity: Not on file  Stress: Not on file  Social Connections: Not on file  Intimate Partner Violence: Not At Risk (12/23/2022)   Humiliation, Afraid, Rape, and Kick questionnaire    Fear of Current or Ex-Partner: No    Emotionally Abused: No    Physically Abused: No    Sexually Abused: No    Review of Systems: ROS negative except for what is noted on the assessment and plan.  Objective:   Vitals:   01/05/23 1418  BP: 105/77  Pulse: 75  Temp: 98.2 F (36.8 C)  TempSrc: Oral  SpO2: 100%  Weight: 132 lb (59.9 kg)  Height: 5\' 2"  (1.575 m)    Physical Exam: Constitutional: well-appearing Cardiovascular: regular rate and rhythm, no m/r/g Pulmonary/Chest: normal work of breathing on room air, lungs clear to auscultation bilaterally Abdominal: soft, non-tender, non-distended MSK: normal bulk and tone Neurological: alert & oriented x 3, normal gait Skin:    Assessment & Plan:  Seizure disorder (HCC) Keppra 750 mg biD.  Last seen by Dr. Karel Jarvis in 2022. Her dose of keppra was increased during admission. She  is concerned that higher dosing is causing her to have shaking in her hands at night. A: Low suspicion for tremors 2/2 to keppra. She reports that she drinks a beer every other day. Denies withdrawal symptoms recently -referral to neurology placed. -continue keppra 750 mg BID  Transaminitis LFTs AST>ALT elevated. T bili not elevated. [place] RUQ cholecystitis, HIDA negative. CT scan negative, AMA negative.  A:  Elevated liver enzymes in setting of alcohol use and likely shock liver from hypotension P: Repeat CMP with normalized liver function  Hyperlipidemia Lab Results  Component Value Date   CHOL 154 04/27/2022   HDL 87 04/27/2022   LDLCALC  48 04/27/2022   LDLDIRECT 153 (H) 01/30/2008   TRIG 109 04/27/2022   CHOLHDL 1.8 04/27/2022   Atorvastatin held at discharge in setting of elevated LFTs. LFTs normalized with follow-up. I am concerned for polypharmacy in this patient. P: Repeat lipid panel at follow-up. Would be reasonable to restart statin in future. However would prioritize adherence with seizure medication and GI meds over statin. She is to bring in all medications at follow-up as difficult to clarify what she is taking at home  Localized skin desquamation Mentioned at end of visit. She reports that her hands started peeling after getting an IV medication while in the hospital. A: See media tab. She has palmar desquamation bilaterally. There is no warmth or erythema present This could be drug induced or allergic P: OTC steroid cream F/u in 4 weeks  Hypothyroidism Was elevated at 6.6 in hospital Repeat TSH elevated at 9.2.  This could be subclinical versus overt hypothyroidism.  Plan to get free T4 at follow-up in 4 weeks  Vitamin D deficiency Vitamin d 14 in hospital 50,000 for 7 weeks was prescribed. Unclear if patient is taking this as she does not know any medications besides seizure meds, F/u in 4 weeks with all medications  Hypocalcemia Corrected calcium 8.6 on  repeat CMP  Idiopathic chronic gout of right knee without tophus Medications include allopurinol and colchine. Unclear if she is taking medications. She is not having gout flare at this time. P: Reconcile medications at follow-up  Macrocytic anemia Iron/TIBC/Ferritin/ %Sat    Component Value Date/Time   IRON 132 12/23/2022 2137   TIBC 154 (L) 12/23/2022 2137   FERRITIN 895 (H) 12/23/2022 2137   IRONPCTSAT 86 (H) 12/23/2022 2137   Iron studies were not consistent with iron deficiency. Appears like anemia of chronic disease. P: Repeat CBC showeMCV of 102. She is likely folate and/or B12 deficit with history of AUD.  -check B12 and folate at follow-up  CKD (chronic kidney disease) stage 3, GFR 30-59 ml/min (HCC) History of ckd 3a. She had aki on CKD in setting of hypotension. Peak creatinine at 4.5 during admission, prior baseline 0.9-1.3. P: Repeat CMP with creatinine of 1.3   Dysphagia Patient reports this has resolved. She did not complete MBS during admission  Anemia due to chronic blood loss She was admitted at end of October and hgb <6. She declined colonoscopy during admission and source of bleed is unclear.  P: Repeat cbc with improved hgb at 8.9. I have concern for known duodenal ulcer bleed versus lower GI bleed. She denies blood in stool since discharge. GI referral placed Unclear if patient is taking protonix  Left shoulder pain Continues to have shoulder pain. Time limited at this visit. She requested mobic RX. With history of duodenal hemorrhage and recent likely GI bleed I reviewed concern about NSAIDs for her. P: F/u in 4 weeks to address shoulder pain    Patient discussed with Dr. Marjorie Smolder Casson Catena, D.O. [facility] [facility]  PGY-3 Pager: [phone]  Phone: [phone] Date 01/06/2023  Time 4:28 PM

## 2023-01-05 NOTE — Patient Instructions (Signed)
Thank you, Ms.[name] for allowing [place] to provide your care today.   I am checking several labs to follow-up on blood count and liver function from your recent hospital admission.  I have referred you to GI for colonoscopy, we need out find out why you were bleeding.  I referred you to Dr. Karel Jarvis with neurology.  I have ordered the following labs for you:  Lab Orders         CBC no Diff         CMP14 + Anion Gap         TSH      Referrals ordered today:   Referral Orders         Ambulatory referral to Neurology         Ambulatory referral to Gastroenterology       I have ordered the following medication/changed the following medications:   Stop the following medications: Medications Discontinued During This Encounter  Medication Reason   loperamide (IMODIUM) 2 MG capsule      Start the following medications: No orders of the defined types were placed in this encounter.    Follow up:  as soon as possible! Bring in ALL medications    We look forward to seeing you next time. Please call our clinic at [phone] if you have any questions or concerns. The best time to call is Monday-Friday from 9am-4pm, but there is someone available 24/7. If after hours or the weekend, call the main hospital number and ask for the Internal Medicine Resident On-Call. If you need medication refills, please notify your pharmacy one week in advance and they will send [place] a request.   Thank you for trusting me with your care. Wishing you the best!   Rudene Christians, DO [facility]

## 2023-01-06 ENCOUNTER — Encounter: Payer: Self-pay | Admitting: Internal Medicine

## 2023-01-06 DIAGNOSIS — R234 Changes in skin texture: Secondary | ICD-10-CM | POA: Insufficient documentation

## 2023-01-06 DIAGNOSIS — E559 Vitamin D deficiency, unspecified: Secondary | ICD-10-CM | POA: Insufficient documentation

## 2023-01-06 DIAGNOSIS — N183 Chronic kidney disease, stage 3 unspecified: Secondary | ICD-10-CM | POA: Insufficient documentation

## 2023-01-06 DIAGNOSIS — E039 Hypothyroidism, unspecified: Secondary | ICD-10-CM | POA: Insufficient documentation

## 2023-01-06 NOTE — Assessment & Plan Note (Signed)
Was elevated at 6.6 in hospital Repeat TSH elevated at 9.2.  This could be subclinical versus overt hypothyroidism.  Plan to get free T4 at follow-up in 4 weeks

## 2023-01-06 NOTE — Assessment & Plan Note (Signed)
LFTs AST>ALT elevated. T bili not elevated. [place] RUQ cholecystitis, HIDA negative. CT scan negative, AMA negative.  A:  Elevated liver enzymes in setting of alcohol use and likely shock liver from hypotension P: Repeat CMP with normalized liver function

## 2023-01-06 NOTE — Assessment & Plan Note (Signed)
Vitamin d 14 in hospital 50,000 for 7 weeks was prescribed. Unclear if patient is taking this as she does not know any medications besides seizure meds, F/u in 4 weeks with all medications

## 2023-01-06 NOTE — Assessment & Plan Note (Addendum)
Iron/TIBC/Ferritin/ %Sat    Component Value Date/Time   IRON 132 12/23/2022 2137   TIBC 154 (L) 12/23/2022 2137   FERRITIN 895 (H) 12/23/2022 2137   IRONPCTSAT 86 (H) 12/23/2022 2137   Iron studies were not consistent with iron deficiency. Appears like anemia of chronic disease. P: Repeat CBC showeMCV of 102. She is likely folate and/or B12 deficit with history of AUD.  -check B12 and folate at follow-up

## 2023-01-06 NOTE — Assessment & Plan Note (Signed)
Continues to have shoulder pain. Time limited at this visit. She requested mobic RX. With history of duodenal hemorrhage and recent likely GI bleed I reviewed concern about NSAIDs for her. P: F/u in 4 weeks to address shoulder pain

## 2023-01-06 NOTE — Assessment & Plan Note (Signed)
Corrected calcium 8.6 on repeat CMP

## 2023-01-06 NOTE — Assessment & Plan Note (Addendum)
She was admitted at end of October and hgb <6. She declined colonoscopy during admission and source of bleed is unclear.  P: Repeat cbc with improved hgb at 8.9. I have concern for known duodenal ulcer bleed versus lower GI bleed. She denies blood in stool since discharge. GI referral placed Unclear if patient is taking protonix

## 2023-01-06 NOTE — Assessment & Plan Note (Signed)
Lab Results  Component Value Date   CHOL 154 04/27/2022   HDL 87 04/27/2022   LDLCALC 48 04/27/2022   LDLDIRECT 153 (H) 01/30/2008   TRIG 109 04/27/2022   CHOLHDL 1.8 04/27/2022   Atorvastatin held at discharge in setting of elevated LFTs. LFTs normalized with follow-up. I am concerned for polypharmacy in this patient. P: Repeat lipid panel at follow-up. Would be reasonable to restart statin in future. However would prioritize adherence with seizure medication and GI meds over statin. She is to bring in all medications at follow-up as difficult to clarify what she is taking at home

## 2023-01-06 NOTE — Assessment & Plan Note (Signed)
Medications include allopurinol and colchine. Unclear if she is taking medications. She is not having gout flare at this time. P: Reconcile medications at follow-up

## 2023-01-06 NOTE — Assessment & Plan Note (Addendum)
Mentioned at end of visit. She reports that her hands started peeling after getting an IV medication while in the hospital. A: See media tab. She has palmar desquamation bilaterally. There is no warmth or erythema present This could be drug induced or allergic P: OTC steroid cream F/u in 4 weeks

## 2023-01-06 NOTE — Assessment & Plan Note (Signed)
Keppra 750 mg biD.  Last seen by Dr. Karel Jarvis in 2022. Her dose of keppra was increased during admission. She is concerned that higher dosing is causing her to have shaking in her hands at night. A: Low suspicion for tremors 2/2 to keppra. She reports that she drinks a beer every other day. Denies withdrawal symptoms recently -referral to neurology placed. -continue keppra 750 mg BID

## 2023-01-06 NOTE — Assessment & Plan Note (Signed)
Patient reports this has resolved. She did not complete MBS during admission

## 2023-01-06 NOTE — Assessment & Plan Note (Signed)
History of ckd 3a. She had aki on CKD in setting of hypotension. Peak creatinine at 4.5 during admission, prior baseline 0.9-1.3. P: Repeat CMP with creatinine of 1.3

## 2023-01-07 ENCOUNTER — Encounter: Payer: Self-pay | Admitting: Internal Medicine

## 2023-01-12 ENCOUNTER — Ambulatory Visit: Payer: BLUE CROSS/BLUE SHIELD | Admitting: Internal Medicine

## 2023-01-12 VITALS — BP 109/85 | HR 93 | Temp 98.2°F | Wt 124.8 lb

## 2023-01-12 DIAGNOSIS — E559 Vitamin D deficiency, unspecified: Secondary | ICD-10-CM

## 2023-01-12 DIAGNOSIS — E039 Hypothyroidism, unspecified: Secondary | ICD-10-CM

## 2023-01-12 DIAGNOSIS — Q2730 Arteriovenous malformation, site unspecified: Secondary | ICD-10-CM | POA: Diagnosis not present

## 2023-01-12 DIAGNOSIS — I10 Essential (primary) hypertension: Secondary | ICD-10-CM | POA: Diagnosis not present

## 2023-01-12 DIAGNOSIS — G40909 Epilepsy, unspecified, not intractable, without status epilepticus: Secondary | ICD-10-CM

## 2023-01-12 DIAGNOSIS — K31811 Angiodysplasia of stomach and duodenum with bleeding: Secondary | ICD-10-CM

## 2023-01-12 DIAGNOSIS — F1090 Alcohol use, unspecified, uncomplicated: Secondary | ICD-10-CM

## 2023-01-12 DIAGNOSIS — M1A0610 Idiopathic chronic gout, right knee, without tophus (tophi): Secondary | ICD-10-CM | POA: Diagnosis not present

## 2023-01-12 DIAGNOSIS — R7989 Other specified abnormal findings of blood chemistry: Secondary | ICD-10-CM | POA: Diagnosis not present

## 2023-01-12 MED ORDER — THIAMINE HCL 100 MG PO TABS: 100.0000 mg | ORAL_TABLET | Freq: Every day | ORAL | 0 refills | Status: DC

## 2023-01-12 MED ORDER — COLCHICINE 0.6 MG PO TABS: 0.6000 mg | ORAL_TABLET | Freq: Every day | ORAL | 3 refills | Status: DC

## 2023-01-12 NOTE — Patient Instructions (Addendum)
Thank you, Christina Byrd for allowing [place] to provide your care today. Today we discussed:  Today we reviewed all of your medications. Here are the meds to TAKE: Thiamine 100 mg daily Keppra 750 mg TWICE daily Protonix 40 mg daily Vitamin D once a week on Wednesdays Folic acid 1 mg daily Colchicine 0.6 mg daily Allopurinol 200 mg daily Antioxidant 1 tablet daily  I recommend you do not take diclofenac for pain, since it can cause stomach bleeding like you have had before. You can take tylenol for your pain.  Call your neurologist, Dr.Aquino to talk about your Keppra dose. [phone]   I have ordered the following labs for you:  Lab Orders         T4, Free       Referrals ordered today:   Referral Orders  No referral(s) requested today     I have ordered the following medication/changed the following medications:   Stop the following medications: Medications Discontinued During This Encounter  Medication Reason   colchicine 0.6 MG tablet Reorder   thiamine (VITAMIN B1) 100 MG tablet Reorder     Start the following medications: Meds ordered this encounter  Medications   colchicine 0.6 MG tablet    Sig: Take 1 tablet (0.6 mg total) by mouth daily.    Dispense:  30 tablet    Refill:  3   thiamine (VITAMIN B1) 100 MG tablet    Sig: Take 1 tablet (100 mg total) by mouth daily.    Dispense:  30 tablet    Refill:  0     Follow up: 2 months    Should you have any questions or concerns please call the internal medicine clinic at [phone].     Elza Rafter, D.O. [facility]

## 2023-01-12 NOTE — Progress Notes (Unsigned)
CC: medication reconciliation  HPI:  [name] is a 57 y.o. female living with a history stated below and presents today for a 1 week follow up for a thorough medication reconciliation. Please see problem based assessment and plan for additional details.  Past Medical History:  Diagnosis Date   GASTROESOPHAGEAL REFLUX, NO ESOPHAGITIS 04/21/2006   Qualifier: Diagnosis of  By: Levada Schilling     GERD (gastroesophageal reflux disease) Dx 1995   HTN (hypertension)    Hypotension 10/21/2022   Patient's blood pressure at presentation was 87/65, follow up blood pressure was 81/58. Patient reports regurgitating everything she has taken po for the past month. Denies lightheadedness or dizziness. Discussed GI referral, though patient stated she would be going to the emergency room today for IV fluids.   -GI referral for upper endoscopy placed  -Patient planning on going to the ED for IV flu   Intertrigo 06/22/2016   Seizures (HCC)     Current Outpatient Medications on File Prior to Visit  Medication Sig Dispense Refill   ergocalciferol (VITAMIN D2) 1.25 MG (50000 UT) capsule Take 1 capsule by mouth once a week. Every Wednesday starting 11/13 7 capsule 0   feeding supplement (ENSURE ENLIVE / ENSURE PLUS) LIQD Take 237 mLs by mouth 3 (three) times daily between meals. 237 mL 12   folic acid (FOLVITE) 1 MG tablet Take 1 tablet (1 mg total) by mouth daily. 30 tablet 2   hydrocortisone cream 1 % Apply 1 Application topically 2 (two) times daily. 30 g 0   levETIRAcetam (KEPPRA) 750 MG tablet Take 1 tablet (750 mg total) by mouth 2 (two) times daily. 60 tablet 2   Multiple Vitamin (MULTIVITAMIN WITH MINERALS) TABS tablet Take 1 tablet by mouth daily. 30 tablet 0   Nystatin (GERHARDT'S BUTT CREAM) CREA Apply 1 Application topically 4 (four) times daily -  before meals and at bedtime. 1 each 0   pantoprazole (PROTONIX) 40 MG tablet Take 1 tablet (40 mg total) by mouth daily. 90 tablet 3   Zinc  Oxide (TRIPLE PASTE) 12.8 % ointment Apply topically as needed for irritation. In your buttocks 56.7 g 0   [DISCONTINUED] metFORMIN (GLUCOPHAGE) 500 MG tablet Take 0.5 tablets (250 mg total) by mouth daily with breakfast. 45 tablet 6   No current facility-administered medications on file prior to visit.    Family History  Problem Relation Age of Onset   CAD Mother     Social History   Socioeconomic History   Marital status: Single    Spouse name: Not on file   Number of children: Not on file   Years of education: Not on file   Highest education level: Not on file  Occupational History   Not on file  Tobacco Use   Smoking status: Former    Current packs/day: 0.00    Types: Cigarettes    Quit date: 06/25/2012    Years since quitting: 10.5   Smokeless tobacco: Never  Vaping Use   Vaping status: Never Used  Substance and Sexual Activity   Alcohol use: Not Currently    Alcohol/week: 49.0 standard drinks of alcohol    Types: 14 Cans of beer, 35 Shots of liquor per week   Drug use: No   Sexual activity: Not Currently  Other Topics Concern   Not on file  Social History Narrative   Pt lives in single story home with her partner   Has 1 son   5 grandchildren  10th grade education   Works at [employer].    Right handed   Social Determinants of Health   Financial Resource Strain: Not on file  Food Insecurity: No Food Insecurity (12/23/2022)   Hunger Vital Sign    Worried About Running Out of Food in the Last Year: Never true    Ran Out of Food in the Last Year: Never true  Transportation Needs: No Transportation Needs (12/23/2022)   PRAPARE - Administrator, Civil Service (Medical): No    Lack of Transportation (Non-Medical): No  Physical Activity: Not on file  Stress: Not on file  Social Connections: Not on file  Intimate Partner Violence: Not At Risk (12/23/2022)   Humiliation, Afraid, Rape, and Kick questionnaire    Fear of Current or Ex-Partner: No     Emotionally Abused: No    Physically Abused: No    Sexually Abused: No    Review of Systems: ROS negative except for what is noted on the assessment and plan.  Vitals:   01/12/23 1017  BP: 109/85  Pulse: 93  Temp: 98.2 F (36.8 C)  TempSrc: Oral  Weight: 124 lb 12.8 oz (56.6 kg)    Physical Exam: Constitutional: well-appearing middle aged female sitting in chair, in no acute distress Cardiovascular: regular rate and rhythm, no m/r/g Pulmonary/Chest: normal work of breathing on room air MSK: normal bulk and tone Neurological: alert & oriented x 3, no focal deficit Skin: warm and dry Psych: normal mood and behavior  Assessment & Plan:    Patient discussed with Dr. Sol Blazing  HYPERTENSION, BENIGN ESSENTIAL The patient was recently discharged from the hospital and was told to STOP taking many of her old medications, so she presents today to review which medications she should and should not be taking. She had an old prescription of amlodipine which she was previously advised to discontinue. BP today is 109/85 off amlodipine and we will continue to hold this.   Hypothyroidism TSH elevated to 9 at the last office visit but her free T4 is within normal limits. The patient denies any constipation, cold intolerance, hair loss, brittle nails, weight gain, or other signs of hypothyroidism. 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   Seizure disorder (HCC) Prescribed Keppra 750 mg bid but the patient states that she is taking 750 mg every morning and 500 mg in the evening, as the 750 mg evening dose makes her have tremors x 1 hour. She has not had any seizures since she self reduced this dose, but her dose was increased by her neurologist as she had a breakthrough seizure on the 500 mg bid dose. I advised the patient to make an appt with her neurologist and to continue the 750 mg bid dose in the interim.   Alcohol use disorder The patient has a history of AUD, but reports that she has  cut down drinking. She has quit drinking hard liquor and drinks a few beers per night now. Advised patient to continue taking thiamine 100 mg daily, folic acid 1 mg daily, and a multivitamin/antioxidant that she was prescribed in the hospital.  Idiopathic chronic gout of right knee without tophus The patient confirmed she is taking allopurinol 200 mg daily and colchicine 0.6 mg daily. No current gout flare at this time.   Vitamin D deficiency Vitamin D 50,000U weekly prescribed in the hospital. Patient has this medicine and is going to start taking it once a week today.   AVM (arteriovenous malformation) of stomach, acquired  with hemorrhage Patient has a history of GERD and AVM of stomach. She is taking protonix 40 mg daily. Of note, she is also taking diclofenac 75 mg prn (takes it about once a week). I counseled her against using this medication, given her risk of GI bleeding, however, she is adamant that she wants to continue this medication.    Elza Rafter, D.O. [facility], PGY-3 Phone: [phone] Date 01/13/2023 Time 7:55 AM

## 2023-01-13 ENCOUNTER — Encounter: Payer: Self-pay | Admitting: Internal Medicine

## 2023-01-13 LAB — T4, FREE: Free T4: 1.18 ng/dL (ref 0.82–1.77)

## 2023-01-13 MED ORDER — ALLOPURINOL 100 MG PO TABS: 200.0000 mg | ORAL_TABLET | Freq: Every day | ORAL | 3 refills | Status: DC

## 2023-01-13 NOTE — Assessment & Plan Note (Signed)
The patient was recently discharged from the hospital and was told to STOP taking many of her old medications, so she presents today to review which medications she should and should not be taking. She had an old prescription of amlodipine which she was previously advised to discontinue. BP today is 109/85 off amlodipine and we will continue to hold this.

## 2023-01-13 NOTE — Assessment & Plan Note (Signed)
The patient confirmed she is taking allopurinol 200 mg daily and colchicine 0.6 mg daily. No current gout flare at this time.

## 2023-01-13 NOTE — Progress Notes (Signed)
Internal Medicine Clinic Attending ° °Case discussed with Dr. Atway  At the time of the visit.  We reviewed the resident’s history and exam and pertinent patient test results.  I agree with the assessment, diagnosis, and plan of care documented in the resident’s note.  °

## 2023-01-13 NOTE — Assessment & Plan Note (Signed)
Patient has a history of GERD and AVM of stomach. She is taking protonix 40 mg daily. Of note, she is also taking diclofenac 75 mg prn (takes it about once a week). I counseled her against using this medication, given her risk of GI bleeding, however, she is adamant that she wants to continue this medication.

## 2023-01-13 NOTE — Assessment & Plan Note (Signed)
Prescribed Keppra 750 mg bid but the patient states that she is taking 750 mg every morning and 500 mg in the evening, as the 750 mg evening dose makes her have tremors x 1 hour. She has not had any seizures since she self reduced this dose, but her dose was increased by her neurologist as she had a breakthrough seizure on the 500 mg bid dose. I advised the patient to make an appt with her neurologist and to continue the 750 mg bid dose in the interim.

## 2023-01-13 NOTE — Assessment & Plan Note (Signed)
Vitamin D 50,000U weekly prescribed in the hospital. Patient has this medicine and is going to start taking it once a week today.

## 2023-01-13 NOTE — Assessment & Plan Note (Signed)
The patient has a history of AUD, but reports that she has cut down drinking. She has quit drinking hard liquor and drinks a few beers per night now. Advised patient to continue taking thiamine 100 mg daily, folic acid 1 mg daily, and a multivitamin/antioxidant that she was prescribed in the hospital.

## 2023-01-13 NOTE — Assessment & Plan Note (Signed)
TSH elevated to 9 at the last office visit but her free T4 is within normal limits. The patient denies any constipation, cold intolerance, hair loss, brittle nails, weight gain, or other signs of hypothyroidism. 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

## 2023-01-13 NOTE — Progress Notes (Signed)
Internal Medicine Clinic Attending  Case discussed with the resident at the time of the visit.  We reviewed the resident's history and exam and pertinent patient test results.  I agree with the assessment, diagnosis, and plan of care documented in the resident's note. Note ferritin markedly elevated (Fe not elevated) - acute phase reactant? No current infection.  Would repeat this at future visit.

## 2023-01-13 NOTE — Assessment & Plan Note (Signed)
>>  ASSESSMENT AND PLAN FOR ALCOHOL USE DISORDER WRITTEN ON 01/13/2023  7:49 AM BY ATWAY, RAYANN N, DO  The patient has a history of AUD, but reports that she has cut down drinking. She has quit drinking hard liquor and drinks a few beers per night now. Advised patient to continue taking thiamine 100 mg daily, folic acid 1 mg daily, and a multivitamin/antioxidant that she was prescribed in the hospital.

## 2023-01-14 ENCOUNTER — Telehealth: Payer: Self-pay | Admitting: Neurology

## 2023-01-14 NOTE — Telephone Encounter (Signed)
Caller stated she was told by the hospital to contact seizure doctor asap. Pt stated she had two seizures at home, two in the back of the ambulance, and one at the hospital.

## 2023-01-14 NOTE — Telephone Encounter (Signed)
Called patient and unable to leave a message as there is no voicemail.   Pt c/o: seizure Missed medications?   Sleep deprived?   Alcohol intake?   Back to their usual baseline self?   If no, advise go to ER Current medications prescribed by Dr. Karel Jarvis:

## 2023-01-17 NOTE — Telephone Encounter (Signed)
Pt called no answer left a voice mail to call the office back  °

## 2023-01-18 ENCOUNTER — Encounter: Payer: BLUE CROSS/BLUE SHIELD | Admitting: Student

## 2023-01-18 NOTE — Telephone Encounter (Signed)
Pt called no answer unable to leave a voice mail

## 2023-01-18 NOTE — Telephone Encounter (Signed)
Pt called no answer unable to leave a voice mail  

## 2023-01-23 ENCOUNTER — Inpatient Hospital Stay (HOSPITAL_COMMUNITY)
Admission: EM | Admit: 2023-01-23 | Discharge: 2023-01-24 | DRG: 101 | Disposition: A | Discharge status: Home / Self Care | Payer: BLUE CROSS/BLUE SHIELD | Attending: Internal Medicine | Admitting: Internal Medicine

## 2023-01-23 ENCOUNTER — Other Ambulatory Visit: Payer: Self-pay

## 2023-01-23 ENCOUNTER — Encounter (HOSPITAL_COMMUNITY): Payer: Self-pay

## 2023-01-23 DIAGNOSIS — Z91148 Patient's other noncompliance with medication regimen for other reason: Secondary | ICD-10-CM

## 2023-01-23 DIAGNOSIS — I10 Essential (primary) hypertension: Secondary | ICD-10-CM | POA: Diagnosis present

## 2023-01-23 DIAGNOSIS — Z888 Allergy status to other drugs, medicaments and biological substances status: Secondary | ICD-10-CM

## 2023-01-23 DIAGNOSIS — M1A0610 Idiopathic chronic gout, right knee, without tophus (tophi): Secondary | ICD-10-CM | POA: Diagnosis present

## 2023-01-23 DIAGNOSIS — F1090 Alcohol use, unspecified, uncomplicated: Secondary | ICD-10-CM | POA: Diagnosis not present

## 2023-01-23 DIAGNOSIS — R1313 Dysphagia, pharyngeal phase: Secondary | ICD-10-CM | POA: Diagnosis present

## 2023-01-23 DIAGNOSIS — Z7984 Long term (current) use of oral hypoglycemic drugs: Secondary | ICD-10-CM

## 2023-01-23 DIAGNOSIS — F10231 Alcohol dependence with withdrawal delirium: Secondary | ICD-10-CM | POA: Diagnosis present

## 2023-01-23 DIAGNOSIS — Y904 Blood alcohol level of 80-99 mg/100 ml: Secondary | ICD-10-CM | POA: Diagnosis present

## 2023-01-23 DIAGNOSIS — Z560 Unemployment, unspecified: Secondary | ICD-10-CM | POA: Diagnosis not present

## 2023-01-23 DIAGNOSIS — Z881 Allergy status to other antibiotic agents status: Secondary | ICD-10-CM | POA: Diagnosis not present

## 2023-01-23 DIAGNOSIS — Z8249 Family history of ischemic heart disease and other diseases of the circulatory system: Secondary | ICD-10-CM

## 2023-01-23 DIAGNOSIS — E039 Hypothyroidism, unspecified: Secondary | ICD-10-CM | POA: Diagnosis present

## 2023-01-23 DIAGNOSIS — Z79899 Other long term (current) drug therapy: Secondary | ICD-10-CM

## 2023-01-23 DIAGNOSIS — G40919 Epilepsy, unspecified, intractable, without status epilepticus: Secondary | ICD-10-CM | POA: Diagnosis present

## 2023-01-23 DIAGNOSIS — R000 Tachycardia, unspecified: Secondary | ICD-10-CM | POA: Diagnosis not present

## 2023-01-23 DIAGNOSIS — Z883 Allergy status to other anti-infective agents status: Secondary | ICD-10-CM

## 2023-01-23 DIAGNOSIS — R0902 Hypoxemia: Secondary | ICD-10-CM | POA: Diagnosis not present

## 2023-01-23 DIAGNOSIS — E872 Acidosis, unspecified: Secondary | ICD-10-CM | POA: Diagnosis present

## 2023-01-23 DIAGNOSIS — D649 Anemia, unspecified: Secondary | ICD-10-CM | POA: Diagnosis present

## 2023-01-23 DIAGNOSIS — G40401 Other generalized epilepsy and epileptic syndromes, not intractable, with status epilepticus: Principal | ICD-10-CM | POA: Diagnosis present

## 2023-01-23 DIAGNOSIS — Z885 Allergy status to narcotic agent status: Secondary | ICD-10-CM | POA: Diagnosis not present

## 2023-01-23 DIAGNOSIS — Z87891 Personal history of nicotine dependence: Secondary | ICD-10-CM

## 2023-01-23 DIAGNOSIS — R0689 Other abnormalities of breathing: Secondary | ICD-10-CM | POA: Diagnosis not present

## 2023-01-23 DIAGNOSIS — R569 Unspecified convulsions: Principal | ICD-10-CM

## 2023-01-23 DIAGNOSIS — R404 Transient alteration of awareness: Secondary | ICD-10-CM | POA: Diagnosis not present

## 2023-01-23 LAB — BASIC METABOLIC PANEL
Anion gap: 20 — ABNORMAL HIGH (ref 5–15)
BUN: 9 mg/dL (ref 6–20)
CO2: 11 mmol/L — ABNORMAL LOW (ref 22–32)
Calcium: 8.4 mg/dL — ABNORMAL LOW (ref 8.9–10.3)
Chloride: 109 mmol/L (ref 98–111)
Creatinine, Ser: 0.99 mg/dL (ref 0.44–1.00)
GFR, Estimated: >60 mL/min (ref >60)
Glucose, Bld: 76 mg/dL (ref 70–99)
Potassium: 3.7 mmol/L (ref 3.5–5.1)
Sodium: 140 mmol/L (ref 135–145)

## 2023-01-23 LAB — ETHANOL: Alcohol, Ethyl (B): 81 mg/dL — ABNORMAL HIGH (ref <10)

## 2023-01-23 LAB — I-STAT VENOUS BLOOD GAS, ED
Acid-base deficit: 17 mmol/L — ABNORMAL HIGH (ref 0.0–2.0)
Bicarbonate: 9.8 mmol/L — ABNORMAL LOW (ref 20.0–28.0)
Calcium, Ion: 1.11 mmol/L — ABNORMAL LOW (ref 1.15–1.40)
HCT: 28 % — ABNORMAL LOW (ref 36.0–46.0)
Hemoglobin: 9.5 g/dL — ABNORMAL LOW (ref 12.0–15.0)
O2 Saturation: 93 %
Potassium: 3.7 mmol/L (ref 3.5–5.1)
Sodium: 141 mmol/L (ref 135–145)
TCO2: 11 mmol/L — ABNORMAL LOW (ref 22–32)
pCO2, Ven: 25.3 mm[Hg] — ABNORMAL LOW (ref 44–60)
pH, Ven: 7.196 — CL (ref 7.25–7.43)
pO2, Ven: 82 mm[Hg] — ABNORMAL HIGH (ref 32–45)

## 2023-01-23 LAB — CBC WITH DIFFERENTIAL/PLATELET
Abs Immature Granulocytes: 0.06 10*3/uL (ref 0.00–0.07)
Basophils Absolute: 0.1 10*3/uL (ref 0.0–0.1)
Basophils Relative: 1 %
Eosinophils Absolute: 0 10*3/uL (ref 0.0–0.5)
Eosinophils Relative: 0 %
HCT: 27.3 % — ABNORMAL LOW (ref 36.0–46.0)
Hemoglobin: 8.7 g/dL — ABNORMAL LOW (ref 12.0–15.0)
Immature Granulocytes: 1 %
Lymphocytes Relative: 44 %
Lymphs Abs: 4.3 10*3/uL — ABNORMAL HIGH (ref 0.7–4.0)
MCH: 31.5 pg (ref 26.0–34.0)
MCHC: 31.9 g/dL (ref 30.0–36.0)
MCV: 98.9 fL (ref 80.0–100.0)
Monocytes Absolute: 0.5 10*3/uL (ref 0.1–1.0)
Monocytes Relative: 6 %
Neutro Abs: 4.7 10*3/uL (ref 1.7–7.7)
Neutrophils Relative %: 48 %
Platelets: 243 10*3/uL (ref 150–400)
RBC: 2.76 MIL/uL — ABNORMAL LOW (ref 3.87–5.11)
RDW: 17.8 % — ABNORMAL HIGH (ref 11.5–15.5)
WBC: 9.6 10*3/uL (ref 4.0–10.5)
nRBC: 0 % (ref 0.0–0.2)

## 2023-01-23 LAB — CBG MONITORING, ED: Glucose-Capillary: 77 mg/dL (ref 70–99)

## 2023-01-23 MED ORDER — LEVETIRACETAM IN NACL 1000 MG/100ML IV SOLN: 1000.0000 mg | Freq: Two times a day (BID) | INTRAVENOUS | Status: DC

## 2023-01-23 MED ORDER — FOLIC ACID 1 MG PO TABS
1.0000 mg | ORAL_TABLET | Freq: Every day | ORAL | Status: DC
  Administered 2023-01-24: 1 mg via ORAL
  Filled 2023-01-23: qty 1

## 2023-01-23 MED ORDER — ENOXAPARIN SODIUM 40 MG/0.4ML IJ SOSY: 40.0000 mg | PREFILLED_SYRINGE | INTRAMUSCULAR | Status: DC

## 2023-01-23 MED ORDER — SODIUM CHLORIDE 0.9 % IV SOLN: 60.0000 mg/kg | Freq: Once | INTRAVENOUS | Status: DC

## 2023-01-23 MED ORDER — THIAMINE HCL 100 MG/ML IJ SOLN: 100.0000 mg | Freq: Once | INTRAMUSCULAR | Status: DC

## 2023-01-23 MED ORDER — LEVETIRACETAM IN NACL 1500 MG/100ML IV SOLN
1500.0000 mg | Freq: Once | INTRAVENOUS | Status: AC
  Administered 2023-01-23: 1500 mg via INTRAVENOUS
  Filled 2023-01-23 (×3): qty 100

## 2023-01-23 MED ORDER — SODIUM CHLORIDE 0.9 % IV SOLN
750.0000 mg | Freq: Two times a day (BID) | INTRAVENOUS | Status: DC
  Administered 2023-01-24: 750 mg via INTRAVENOUS
  Filled 2023-01-23 (×3): qty 7.5

## 2023-01-23 MED ORDER — LEVETIRACETAM IN NACL 1000 MG/100ML IV SOLN
1000.0000 mg | Freq: Once | INTRAVENOUS | Status: AC
  Administered 2023-01-23: 1000 mg via INTRAVENOUS
  Filled 2023-01-23: qty 100

## 2023-01-23 MED ORDER — THIAMINE MONONITRATE 100 MG PO TABS
100.0000 mg | ORAL_TABLET | Freq: Every day | ORAL | Status: DC
  Administered 2023-01-24: 100 mg via ORAL
  Filled 2023-01-23: qty 1

## 2023-01-23 MED ORDER — LORAZEPAM 1 MG PO TABS
1.0000 mg | ORAL_TABLET | ORAL | Status: DC | PRN
  Filled 2023-01-23: qty 1

## 2023-01-23 MED ORDER — LORAZEPAM 2 MG/ML IJ SOLN
1.0000 mg | INTRAMUSCULAR | Status: DC | PRN
Start: 2023-01-23 — End: 2023-01-24

## 2023-01-23 MED ORDER — ADULT MULTIVITAMIN W/MINERALS CH
1.0000 | ORAL_TABLET | Freq: Every day | ORAL | Status: DC
  Administered 2023-01-24: 1 via ORAL
  Filled 2023-01-23: qty 1

## 2023-01-23 MED ORDER — PHENOBARBITAL SODIUM 65 MG/ML IJ SOLN
260.0000 mg | Freq: Once | INTRAMUSCULAR | Status: AC
  Administered 2023-01-23: 260 mg via INTRAVENOUS
  Filled 2023-01-23: qty 4

## 2023-01-23 MED ORDER — THIAMINE HCL 100 MG/ML IJ SOLN: 100.0000 mg | Freq: Every day | INTRAMUSCULAR | Status: DC

## 2023-01-23 NOTE — ED Triage Notes (Signed)
Patient arrived by [facility] from home with seizure today. Spouse reports that patient ha hx of and every few months has a series of seizures. Today had 2 prior to EMS arrival and than another seizure in ems presence lasting about 90 seconds. Ems administered versed 2.5 pta.  Arrived on NRBM and post-ictal. No signs of trauma. Cbg 77

## 2023-01-23 NOTE — Consult Note (Signed)
NEUROLOGY CONSULT NOTE   Date of service: January 23, 2023 Patient Name: Christina Byrd MRN:  [id number] DOB:  1965/04/17 Chief Complaint: "Seizures" Requesting Provider: Melene Plan, DO  History of Present Illness  Christina Byrd is a 57 y.o. female with a history of seizures as well as alcohol withdrawal who presents with seizures earlier today.  Apparently she has a series of seizures every few months, and had two seizures prior to EMS arrival today.  She "has been drinking" for the past week and a half, though she typically has not been drinking much, just a beer here or there. Over the past few days, however, she has been drinking into the wee hours of the morning. She finished her bottle around daylight. She took her medicine on Friday, but has not taken it since then because she was out of it after drinking.   Today she had several seizures at home, and EMS was called she had a recurrent seizure in the ambulance, and had a seizure in the emergency department with a very prolonged postictal state.  For this reason neurology was consulted.  She is currently starting to improve.  Past History   Past Medical History:  Diagnosis Date   GASTROESOPHAGEAL REFLUX, NO ESOPHAGITIS 04/21/2006   Qualifier: Diagnosis of  By: Levada Schilling     GERD (gastroesophageal reflux disease) Dx 1995   Hot flash, menopausal 06/22/2016   Hot flashes 01/30/2020   HTN (hypertension)    Hypotension 10/21/2022   Patient's blood pressure at presentation was 87/65, follow up blood pressure was 81/58. Patient reports regurgitating everything she has taken po for the past month. Denies lightheadedness or dizziness. Discussed GI referral, though patient stated she would be going to the emergency room today for IV fluids.   -GI referral for upper endoscopy placed  -Patient planning on going to the ED for IV flu   Intertrigo 06/22/2016   Nausea & vomiting 12/23/2022   Nausea and vomiting 12/24/2022   Pain and  swelling of left ankle 12/02/2014   Seizures (HCC)    Tendinitis of right hip flexor 07/23/2014   Tendonitis, Achilles, right 04/01/2014    Past Surgical History:  Procedure Laterality Date   BIOPSY  06/03/2022   Procedure: BIOPSY;  Surgeon: Jenel Lucks, MD;  Location: [facility] ENDOSCOPY;  Service: Gastroenterology;;   ESOPHAGOGASTRODUODENOSCOPY N/A 12/29/2022   Procedure: ESOPHAGOGASTRODUODENOSCOPY (EGD);  Surgeon: Napoleon Form, MD;  Location: [facility] ENDOSCOPY;  Service: Gastroenterology;  Laterality: N/A;   ESOPHAGOGASTRODUODENOSCOPY (EGD) WITH PROPOFOL N/A 06/03/2022   Procedure: ESOPHAGOGASTRODUODENOSCOPY (EGD) WITH PROPOFOL;  Surgeon: Jenel Lucks, MD;  Location: [facility] ENDOSCOPY;  Service: Gastroenterology;  Laterality: N/A;   HOT HEMOSTASIS N/A 06/03/2022   Procedure: HOT HEMOSTASIS (ARGON PLASMA COAGULATION/BICAP);  Surgeon: Jenel Lucks, MD;  Location: [facility] ENDOSCOPY;  Service: Gastroenterology;  Laterality: N/A;    Family History: Family History  Problem Relation Age of Onset   CAD Mother     Social History  reports that she quit smoking about 10 years ago. Her smoking use included cigarettes. She has never used smokeless tobacco. She reports that she does not currently use alcohol after a past usage of about 49.0 standard drinks of alcohol per week. She reports that she does not use drugs.  Allergies  Allergen Reactions   Losartan Potassium     Rash   Levaquin [Levofloxacin In D5w] Rash   Ace Inhibitors Cough    REACTION: cough   Codeine Nausea Only  Cefepime Rash    09/04/12 pm Patient started to break out with small macules after IV Vanco infusion, then macules increased in size after starting cefepime infusion.   Chlorhexidine Itching and Rash   Vancomycin Rash    09/04/12 pm Patient started to break out with small macules after IV Vanco infusion, then macules increased in size after starting cefepime infusion.    Medications   Current  Facility-Administered Medications:    [COMPLETED] levETIRAcetam (KEPPRA) IVPB 1000 mg/100 mL premix, 1,000 mg, Intravenous, Once, Stopped at 01/23/23 2000 **FOLLOWED BY** [COMPLETED] levETIRAcetam (KEPPRA) IVPB 1000 mg/100 mL premix, 1,000 mg, Intravenous, Once, Stopped at 01/23/23 2016 **FOLLOWED BY** levETIRAcetam (KEPPRA) IVPB 1500 mg/ 100 mL premix, 1,500 mg, Intravenous, Once, Doristine Counter, [facility]  Current Outpatient Medications:    allopurinol (ZYLOPRIM) 100 MG tablet, Take 2 tablets (200 mg total) by mouth daily. Pt takes 1 tablet daily., Disp: 60 tablet, Rfl: 3   colchicine 0.6 MG tablet, Take 1 tablet (0.6 mg total) by mouth daily., Disp: 30 tablet, Rfl: 3   ergocalciferol (VITAMIN D2) 1.25 MG (50000 UT) capsule, Take 1 capsule by mouth once a week. Every Wednesday starting 11/13, Disp: 7 capsule, Rfl: 0   feeding supplement (ENSURE ENLIVE / ENSURE PLUS) LIQD, Take 237 mLs by mouth 3 (three) times daily between meals., Disp: 237 mL, Rfl: 12   folic acid (FOLVITE) 1 MG tablet, Take 1 tablet (1 mg total) by mouth daily., Disp: 30 tablet, Rfl: 2   hydrocortisone cream 1 %, Apply 1 Application topically 2 (two) times daily., Disp: 30 g, Rfl: 0   levETIRAcetam (KEPPRA) 750 MG tablet, Take 1 tablet (750 mg total) by mouth 2 (two) times daily., Disp: 60 tablet, Rfl: 2   Multiple Vitamin (MULTIVITAMIN WITH MINERALS) TABS tablet, Take 1 tablet by mouth daily., Disp: 30 tablet, Rfl: 0   Nystatin (GERHARDT'S BUTT CREAM) CREA, Apply 1 Application topically 4 (four) times daily -  before meals and at bedtime., Disp: 1 each, Rfl: 0   pantoprazole (PROTONIX) 40 MG tablet, Take 1 tablet (40 mg total) by mouth daily., Disp: 90 tablet, Rfl: 3   thiamine (VITAMIN B1) 100 MG tablet, Take 1 tablet (100 mg total) by mouth daily., Disp: 30 tablet, Rfl: 0   Zinc Oxide (TRIPLE PASTE) 12.8 % ointment, Apply topically as needed for irritation. In your buttocks, Disp: 56.7 g, Rfl: 0  Vitals   Vitals:   01/23/23  1909 01/23/23 1915 01/23/23 2100 01/23/23 2115  BP:  129/81 (!) 124/93 136/85  Pulse:  (!) 144 (!) 133 (!) 124  Resp:  18 18 11   Temp: 98.8 F (37.1 C)     TempSrc: Axillary     SpO2:  100% 100% 100%    There is no height or weight on file to calculate BMI.  Physical Exam   Constitutional: Appears well-developed and well-nourished.   Neurologic Examination    Neuro: Mental Status: Patient is somnolent but arousable, she is unable to give me the month or year but knows that she is in the hospital.  She knows her name, and is able to answer some questions but is not very cooperative or forthcoming.  She is able to name simple objects. Cranial Nerves: II: She endorses seeing fingers wiggle in all visual fields, but does not cooperate with counting, particular on the right so I am uncertain if she is actually seeing or not on that side. Pupils are equal, round, and reactive to light.  III,IV, VI: EOMI without ptosis or diploplia.  V: Facial sensation is symmetric to temperature VII: Facial movement is symmetric.  VIII: hearing is intact to voice X: Uvula elevates symmetrically XII: tongue is midline without atrophy or fasciculations.  Motor: She gives very poor effort throughout, but is able to hold all extremities against gravity at least for some period of time. Sensory: She endorses symmetric sensation Cerebellar: Does not perform   Labs/Imaging/Neurodiagnostic studies   CBC:  Recent Labs  Lab 12-Feb-2023 1846 02-12-2023 1857  WBC 9.6  --   NEUTROABS 4.7  --   HGB 8.7* 9.5*  HCT 27.3* 28.0*  MCV 98.9  --   PLT 243  --    Basic Metabolic Panel:  Lab Results  Component Value Date   NA 141 Feb 12, 2023   K 3.7 02/12/23   CO2 11 (L) 02/12/23   GLUCOSE 76 02-12-2023   BUN 9 2023-02-12   CREATININE 0.99 02-12-2023   CALCIUM 8.4 (L) 12-Feb-2023   GFRNONAA >60 February 12, 2023   GFRAA 61 01/02/2020   Lipid Panel:  Lab Results  Component Value Date   LDLCALC 48  04/27/2022   HgbA1c:  Lab Results  Component Value Date   HGBA1C 5.0 07/11/2022   Urine Drug Screen:     Component Value Date/Time   LABOPIA NONE DETECTED 11/20/2022 0457   COCAINSCRNUR NONE DETECTED 11/20/2022 0457   LABBENZ NONE DETECTED 11/20/2022 0457   AMPHETMU NONE DETECTED 11/20/2022 0457   THCU NONE DETECTED 11/20/2022 0457   LABBARB NONE DETECTED 11/20/2022 0457    Alcohol Level     Component Value Date/Time   ETH 81 (H) 2023-02-12 1846   INR  Lab Results  Component Value Date   INR 1.2 12/30/2022      ASSESSMENT   KEBA FLORESCA is a 57 y.o. female with a history of seizures who presents with status epilepticus secondary to medication noncompliance.  She was given phenobarbital out of concern for alcohol withdrawal, but after discussion with her significant other, it sounds like she has been drinking heavily and noncompliant secondary to the heavy drinking and therefore I think this is more likely than alcohol withdrawal seizure.  RECOMMENDATIONS  CIWA protocol Resume home Keppra 750 twice daily Counsel alcohol cessation once she is more coherent Thiamine supplementation. 100mg  daily Please call with if the patient does not continue to return to baseline, or if any other questions remain.  Neurology will be available as needed. ______________________________________________________________________    Stormy Card, MD [facility]

## 2023-01-23 NOTE — ED Provider Notes (Signed)
[place] EMERGENCY DEPARTMENT AT [facility] Provider Note   CSN: [id number] Arrival date & time: 01/23/23  1832     History  Chief Complaint  Patient presents with   Seizures    Christina Byrd is a 57 y.o. female with PMH of seizures, EtOH use, malnutrition, hypothyroidism, CKD who presented to the ED for seizures.  Per EMS, patient reportedly had 2 generalized tonic-clonic seizures at home.  They reports she had 1 generalized clonic seizure with right gaze deviation with them for which they administered her 2.5 mg Versed.  They report her blood sugar was 77.  Patient is currently unable to provide any history.  Attempted to call the patient significant other via telephone, he was unable to be reached.  HPI     Home Medications Prior to Admission medications   Medication Sig Start Date End Date Taking? Authorizing Provider  allopurinol (ZYLOPRIM) 100 MG tablet Take 2 tablets (200 mg total) by mouth daily. Pt takes 1 tablet daily. 01/13/23   Atway, Rayann N, DO  colchicine 0.6 MG tablet Take 1 tablet (0.6 mg total) by mouth daily. 01/12/23   Atway, Rayann N, DO  ergocalciferol (VITAMIN D2) 1.25 MG (50000 UT) capsule Take 1 capsule by mouth once a week. Every Wednesday starting 11/13 12/30/22   Gwenevere Abbot, MD  feeding supplement (ENSURE ENLIVE / ENSURE PLUS) LIQD Take 237 mLs by mouth 3 (three) times daily between meals. 12/30/22   Gwenevere Abbot, MD  folic acid (FOLVITE) 1 MG tablet Take 1 tablet (1 mg total) by mouth daily. 11/22/22   Rhetta Mura, MD  hydrocortisone cream 1 % Apply 1 Application topically 2 (two) times daily. 01/05/23   Masters, Katie, DO  levETIRAcetam (KEPPRA) 750 MG tablet Take 1 tablet (750 mg total) by mouth 2 (two) times daily. 11/22/22   Rhetta Mura, MD  Multiple Vitamin (MULTIVITAMIN WITH MINERALS) TABS tablet Take 1 tablet by mouth daily. 12/31/22   Gwenevere Abbot, MD  Nystatin (GERHARDT'S BUTT CREAM) CREA Apply 1 Application  topically 4 (four) times daily -  before meals and at bedtime. 12/30/22   Gwenevere Abbot, MD  pantoprazole (PROTONIX) 40 MG tablet Take 1 tablet (40 mg total) by mouth daily. 11/03/22   Nooruddin, Jason Fila, MD  thiamine (VITAMIN B1) 100 MG tablet Take 1 tablet (100 mg total) by mouth daily. 01/12/23   Atway, Derwood Kaplan, DO  Zinc Oxide (TRIPLE PASTE) 12.8 % ointment Apply topically as needed for irritation. In your buttocks 12/30/22   Gwenevere Abbot, MD  metFORMIN (GLUCOPHAGE) 500 MG tablet Take 0.5 tablets (250 mg total) by mouth daily with breakfast. 03/05/20 04/02/20  Anders Simmonds, PA-C      Allergies    Losartan potassium, Levaquin [levofloxacin in d5w], Ace inhibitors, Codeine, Cefepime, Chlorhexidine, and Vancomycin    Review of Systems   Review of Systems  Physical Exam Updated Vital Signs BP 136/85   Pulse (!) 124   Temp 98.8 F (37.1 C) (Axillary)   Resp 11   LMP 08/23/2011   SpO2 100%  Physical Exam Constitutional:      General: She is not in acute distress.    Appearance: Normal appearance. She is not ill-appearing.     Comments: Alert to painful stimuli  HENT:     Head: Normocephalic and atraumatic.     Nose: Nose normal.     Mouth/Throat:     Mouth: Mucous membranes are moist.     Pharynx: Oropharynx is clear.  Eyes:     Pupils: Pupils are equal, round, and reactive to light.  Cardiovascular:     Rate and Rhythm: Regular rhythm. Tachycardia present.     Heart sounds: Normal heart sounds. No murmur heard.    No friction rub. No gallop.  Pulmonary:     Effort: Pulmonary effort is normal.     Breath sounds: Normal breath sounds. No stridor. No wheezing, rhonchi or rales.  Abdominal:     Palpations: Abdomen is soft.     Tenderness: There is no abdominal tenderness. There is no guarding or rebound.  Musculoskeletal:        General: Normal range of motion.     Cervical back: Normal range of motion and neck supple.     Right lower leg: No edema.     Left lower leg: No  edema.  Skin:    General: Skin is warm and dry.  Neurological:     General: No focal deficit present.     Mental Status: She is alert.     Comments: Withdraws all 4 extremities to painful stimuli, bilateral ankle clonus    ED Results / Procedures / Treatments   Labs (all labs ordered are listed, but only abnormal results are displayed) Labs Reviewed  BASIC METABOLIC PANEL - Abnormal; Notable for the following components:      Result Value   CO2 11 (*)    Calcium 8.4 (*)    Anion gap 20 (*)    All other components within normal limits  CBC WITH DIFFERENTIAL/PLATELET - Abnormal; Notable for the following components:   RBC 2.76 (*)    Hemoglobin 8.7 (*)    HCT 27.3 (*)    RDW 17.8 (*)    Lymphs Abs 4.3 (*)    All other components within normal limits  ETHANOL - Abnormal; Notable for the following components:   Alcohol, Ethyl (B) 81 (*)    All other components within normal limits  I-STAT VENOUS BLOOD GAS, ED - Abnormal; Notable for the following components:   pH, Ven 7.196 (*)    pCO2, Ven 25.3 (*)    pO2, Ven 82 (*)    Bicarbonate 9.8 (*)    TCO2 11 (*)    Acid-base deficit 17.0 (*)    Calcium, Ion 1.11 (*)    HCT 28.0 (*)    Hemoglobin 9.5 (*)    All other components within normal limits  LEVETIRACETAM LEVEL  CBG MONITORING, ED  CBG MONITORING, ED    EKG EKG Interpretation Date/Time:  Sunday January 23 2023 18:41:21 EST Ventricular Rate:  147 PR Interval:  152 QRS Duration:  63 QT Interval:  279 QTC Calculation: 437 R Axis:   62  Text Interpretation: Sinus tachycardia Ventricular premature complex Aberrant complex Nonspecific T abnormalities, diffuse leads Since last tracing rate faster Otherwise no significant change Confirmed by Melene Plan [phone]) on 01/23/2023 7:16:45 PM  Radiology No results found.  Procedures Procedures    Medications Ordered in ED Medications  levETIRAcetam (KEPPRA) IVPB 1000 mg/100 mL premix (0 mg Intravenous Stopped 01/23/23  2000)    Followed by  levETIRAcetam (KEPPRA) IVPB 1000 mg/100 mL premix (0 mg Intravenous Stopped 01/23/23 2016)    Followed by  levETIRAcetam (KEPPRA) IVPB 1500 mg/ 100 mL premix (1,500 mg Intravenous New Bag/Given 01/23/23 2146)  levETIRAcetam (KEPPRA) IVPB 1000 mg/100 mL premix (has no administration in time range)  PHENObarbital (LUMINAL) injection 260 mg (260 mg Intravenous Given 01/23/23 2006)    ED  Course/ Medical Decision Making/ A&P                                 Medical Decision Making Amount and/or Complexity of Data Reviewed Labs: ordered.  Risk Prescription drug management. Decision regarding hospitalization.   Vital signs stable, patient is responsive to painful stimuli and has no focal deficits.  She appears to be postictal right now, but has no focal deficits on exam.  Blood gas with pH 7.19, pCO2 25.  CBC with anemia around her baseline, BMP with elevated anion gap metabolic acidosis, likely secondary to seizure.  EtOH 81.  Keppra level pending.  Blood glucose 77 here.  Patient observed in the emergency department, she did not appear to return to baseline and nursing reported to me that she had approximately 32nd episode of stiffening with right gaze deviation.  At this point, a 60 mg/kg bolus of Keppra was administered.  Given her history of alcohol withdrawal, 260 mg phenobarbital was also administered.  Patient was subsequently noted to have a decreased heart rate and be more awake and able to talk, although she does not provide meaningful history.  I discussed the patient with neurology, who evaluated the patient at bedside and recommended admission for observation as she has not returned to baseline.  Internal medicine service was contacted, who has accepted the patient for admission.        Final Clinical Impression(s) / ED Diagnoses Final diagnoses:  Seizure [facility])    Rx / DC Orders ED Discharge Orders     None         Janyth Pupa, MD 01/23/23  2153    Melene Plan, DO 01/23/23 2202

## 2023-01-23 NOTE — Hospital Course (Signed)
States she has been taking her keppra twice a day. States she drinks about   Last 24 hours, had a couple drinks. Liquor last night. States she was drinking more than recently. Denies drug use. Was not able to get in with her neurologist.   She did fall, did not hit her head, state sshe lost consciousness.  Keppra 750mg  BID.    Keppra 750 mg BID 9/30pantoprazole 40 mg daily Colchicine 3x0.6mg allopurinol 200 x2 mg  Fenofibrate 1 tablet day Atorvastatin 20 mg dailyzofran folic acid  Diclofenac Keppra 500 mg BID  Lives With: Husband at home Occupation: Works at [employer].  Support: Self Level of Function: Patient is unable to drive due to history of seizures but is able to do all of her other ADLs/IADLs PCP: Christina James, DO Substances: Drinks about 2 beers and 2 shots of rum every day.  Last drink was last night Designated Decision Maker: Husband Code Status: Full code

## 2023-01-23 NOTE — H&P (Cosign Needed Addendum)
Date: 01/23/2023               Patient Name:  Christina Byrd MRN: [id number]  DOB: 1966-02-11 Age / Sex: 57 y.o., female   PCP: [name], DO         Medical Service: Internal Medicine Teaching Service         Attending Physician: Dr. Mercie Eon, MD      First Contact: Dr. Monna Fam, MD Pager [phone]    Second Contact: Dr. 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 History obtained via EMS and ED providers as patient was lethargic upon encounter. She has had two generalized tonic-clonic seizures today, and one on route per EMS for which she was given versed for. She does have a history of alcohol use disorder, and states that she drinks liquor including vodka and rum. She is unable to quantify at this time how much she drank today, or yesterday, however does state that she drank today. Per chart review (H+P on 12/23/22), she was drinking two beers and two shots of rum every day. She has been attempting to cut down her usage, but she states she wasn't able to this weekend and was not giving an explanation for.   She also has had multiple admissions/emergency department visits for breakthrough seizures, for which she is prescribed Keppra 750mg  twice a day, which she has only been taking once a day.   Currently, she denies any fevers, chills, nausea, vomiting, or diarrhea.    Meds:  Allopurinol 100mg  BID  Colochicine .6mg   Vitamin D 50000U Weekly  Keppra 750mg  BID (only taking once a day)  Pantoprazole 40mg   Thiamine 100mg    Past Medical  History  Past Surgical History:  Procedure Laterality Date   BIOPSY  06/03/2022   Procedure: BIOPSY;  Surgeon: Jenel Lucks, MD;  Location: [facility] ENDOSCOPY;  Service: Gastroenterology;;   ESOPHAGOGASTRODUODENOSCOPY N/A 12/29/2022   Procedure: ESOPHAGOGASTRODUODENOSCOPY (EGD);  Surgeon: Napoleon Form, MD;  Location: [facility] ENDOSCOPY;  Service: Gastroenterology;  Laterality: N/A;   ESOPHAGOGASTRODUODENOSCOPY (EGD) WITH PROPOFOL N/A 06/03/2022   Procedure: ESOPHAGOGASTRODUODENOSCOPY (EGD) WITH PROPOFOL;  Surgeon: Jenel Lucks, MD;  Location: [facility] ENDOSCOPY;  Service: Gastroenterology;  Laterality: N/A;   HOT HEMOSTASIS N/A 06/03/2022   Procedure: HOT HEMOSTASIS (ARGON PLASMA COAGULATION/BICAP);  Surgeon: Jenel Lucks, MD;  Location: [facility] ENDOSCOPY;  Service: Gastroenterology;  Laterality: N/A;    Social:  Lives With: Her husband Occupation: Currently unemployed Support: Husband at home Level of Function: Able to perform all ADLs/IADLs when not acutely ill PCP: Dr. [name], DO  Substances: Unable to get a clear quantification of current alcohol use, however per chart review has a 49.0 standard drinks of alcohol per week history.   Family History:   Pt unable to report family history at this time  Allergies: Allergies as of 01/23/2023 - Review Complete 01/23/2023  Allergen Reaction Noted   Losartan potassium  12/20/2012   Levaquin [levofloxacin in d5w] Rash 02/06/2013   Ace inhibitors Cough 01/30/2008   Codeine Nausea Only 05/26/2015  Cefepime Rash 09/05/2012   Chlorhexidine Itching and Rash 07/09/2022   Vancomycin Rash 09/05/2012    Review of Systems: A complete ROS was negative except as per HPI.   OBJECTIVE:   Physical Exam: Blood pressure 136/85, pulse (!) 124, temperature 98.8 F (37.1 C), temperature source Axillary, resp. rate 11, last menstrual period 08/23/2011, SpO2 100%.  Constitutional: Lethargic appearing female, in no acute  distress HENT: normocephalic atraumatic, mucous membranes moist Eyes: conjunctiva non-erythematous Neck: supple Cardiovascular: regular rate and rhythm, no m/r/g Pulmonary/Chest: normal work of breathing on room air, lungs clear to auscultation bilaterally Abdominal: soft, non-tender, non-distended MSK: normal bulk and tone Neurological: alert & oriented x 3, 5/5 strength in bilateral upper and lower extremities, normal gait Skin: warm and dry   Labs: CBC    Component Value Date/Time   WBC 9.6 01/23/2023 1846   RBC 2.76 (L) 01/23/2023 1846   HGB 9.5 (L) 01/23/2023 1857   HGB 12.4 04/27/2022 1125   HCT 28.0 (L) 01/23/2023 1857   HCT 37.6 04/27/2022 1125   PLT 243 01/23/2023 1846   PLT 210 04/27/2022 1125   MCV 98.9 01/23/2023 1846   MCV 97 04/27/2022 1125   MCH 31.5 01/23/2023 1846   MCHC 31.9 01/23/2023 1846   RDW 17.8 (H) 01/23/2023 1846   RDW 17.0 (H) 04/27/2022 1125   LYMPHSABS 4.3 (H) 01/23/2023 1846   LYMPHSABS 1.5 04/27/2022 1125   MONOABS 0.5 01/23/2023 1846   EOSABS 0.0 01/23/2023 1846   EOSABS 0.0 04/27/2022 1125   BASOSABS 0.1 01/23/2023 1846   BASOSABS 0.0 04/27/2022 1125     CMP     Component Value Date/Time   NA 141 01/23/2023 1857   NA 144 04/27/2022 1125   K 3.7 01/23/2023 1857   CL 109 01/23/2023 1846   CO2 11 (L) 01/23/2023 1846   GLUCOSE 76 01/23/2023 1846   BUN 9 01/23/2023 1846   BUN 7 04/27/2022 1125   CREATININE 0.99 01/23/2023 1846   CREATININE 1.00 03/21/2014 1556   CALCIUM 8.4 (L) 01/23/2023 1846   PROT 5.7 (L) 01/05/2023 1517   PROT 7.1 04/27/2022 1125   ALBUMIN 2.2 (L) 01/05/2023 1517   ALBUMIN 3.5 (L) 04/27/2022 1125   AST 34 01/05/2023 1517   ALT 31 01/05/2023 1517   ALKPHOS 89 01/05/2023 1517   BILITOT 0.6 01/05/2023 1517   BILITOT 0.6 04/27/2022 1125   GFRNONAA >60 01/23/2023 1846   GFRNONAA >89 12/11/2013 1214   GFRAA 61 01/02/2020 1100   GFRAA >89 12/11/2013 1214    Imaging:  No Imaging obtained  EKG: personally  reviewed my interpretation is sinus tachycardia, no axis deviation, normal intervals, similar to previous EKG  ASSESSMENT & PLAN:   Assessment & Plan by Problem: Principal Problem:   Breakthrough seizure (HCC)   Christina Byrd is a 57 y.o. person living with a history of seizures, alcohol use disorder, HTN who presented with seizure like activity and admitted for breakthrough seizures on hospital day 0  #Breakthrough Seizure  #Epilepsy Etiology likely in the setting of not taking Keppra appropriately as well as current alcohol use. No infectious causes at this time, and pt's neurological exam is within normal limits so do not believe imaging is warranted. Glucose on BMP was 76, and unclear if pt is eating a normal diet given history of pharyngeal dysphagia, so hypoglycemia may be playing a role here as well. She is s/p loading dose of Keppra. Neurology believes this may be secondary to alcohol withdrawal  as well.  - Resume home Keppra 750mg  BID  #Alcohol Use Disorder  #Hx of Delirium Tremens Last drink this morning (12/1), will need further clarification on amount of alcohol that is being consumed on a daily basis. Given history of Delirium Tremens that required ICU admission in August 2024, will start CIWA with ativan.  -Folic acid/Thiamine/MV  #Anion Gap Metabolic Acidosis Acute in the setting of seizures, AG currently elevated at 20, and bicarb low at 11. Expect this to resolve with adequate seizure treatment as well as PO intake.    Chronic Medical Issues #Hypocalcemia Seems to be a chronic issue, with most recent calcium at 8.4. Will obtain a CMP in the morning to evaluate for hypoalbuminemia as well and assess corrected calcium.    #Normocytic Anemia  Chronic issue for her, Hb currently at 8.7. She has attempted to undergo colonoscopy while inpatient in November however pt could not tolerate bowel prep per chart review. Would recommend outpatient GI referral.    #Idiopathic  Chronic Gout of Right Knee Without Tophus  - Continuing allopurinol and colchicine    Diet: Clear Liquid VTE: Enoxaparin Code: Full  Dispo: Admit patient to Observation with expected length of stay less than 2 midnights.  Signed: Carmina Miller, D.O. Internal Medicine Resident PGY-1 01/23/2023, 11:04 PM

## 2023-01-23 NOTE — H&P (Incomplete Revision)
Date: 01/23/2023               Patient Name:  Christina Byrd MRN: [id number]  DOB: [birth year] Age / Sex: 57 y.o., female   PCP: [name], DO         Medical Service: Internal Medicine Teaching Service         Attending Physician: Dr. Mercie Eon, MD      First Contact: Dr. Monna Fam, MD Pager [phone]    Second Contact: Dr. 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 History obtained via EMS and ED providers as patient was lethargic upon encounter. She has had two generalized tonic-clonic seizures today, and one on route per EMS for which she was given versed for. She does have a history of alcohol use disorder, and states that she drinks liquor including vodka and rum. She is unable to quantify at this time how much she drank today, or yesterday, however does state that she drank today. Per chart review (H+P on 12/23/22), she was drinking two beers and two shots of rum every day. She has been attempting to cut down her usage, but she states she wasn't able to this weekend and was not giving an explanation for.   She also has had multiple admissions/emergency department visits for breakthrough seizures, for which she is prescribed Keppra 750mg  twice a day, which she has only been taking once a day.   Currently, she denies any fevers, chills, nausea, vomiting, or diarrhea.    Meds:  Allopurinol 100mg  BID  Colochicine .6mg   Vitamin D 50000U Weekly  Keppra 750mg  BID (only taking once a day)  Pantoprazole 40mg   Thiamine 100mg    Past Medical  History  Past Surgical History:  Procedure Laterality Date   BIOPSY  06/03/2022   Procedure: BIOPSY;  Surgeon: Jenel Lucks, MD;  Location: [facility] ENDOSCOPY;  Service: Gastroenterology;;   ESOPHAGOGASTRODUODENOSCOPY N/A 12/29/2022   Procedure: ESOPHAGOGASTRODUODENOSCOPY (EGD);  Surgeon: Napoleon Form, MD;  Location: [facility] ENDOSCOPY;  Service: Gastroenterology;  Laterality: N/A;   ESOPHAGOGASTRODUODENOSCOPY (EGD) WITH PROPOFOL N/A 06/03/2022   Procedure: ESOPHAGOGASTRODUODENOSCOPY (EGD) WITH PROPOFOL;  Surgeon: Jenel Lucks, MD;  Location: [facility] ENDOSCOPY;  Service: Gastroenterology;  Laterality: N/A;   HOT HEMOSTASIS N/A 06/03/2022   Procedure: HOT HEMOSTASIS (ARGON PLASMA COAGULATION/BICAP);  Surgeon: Jenel Lucks, MD;  Location: [facility] ENDOSCOPY;  Service: Gastroenterology;  Laterality: N/A;    Social:  Lives With: Her husband Occupation: Currently unemployed Support: Husband at home Level of Function: Able to perform all ADLs/IADLs when not acutely ill PCP: Dr. [name], DO  Substances: Unable to get a clear quantification of current alcohol use, however per chart review has a 49.0 standard drinks of alcohol per week history.   Family History:   Pt unable to report family history at this time  Allergies: Allergies as of 01/23/2023 - Review Complete 01/23/2023  Allergen Reaction Noted   Losartan potassium  12/20/2012   Levaquin [levofloxacin in d5w] Rash 02/06/2013   Ace inhibitors Cough 01/30/2008   Codeine Nausea Only 05/26/2015  Cefepime Rash 09/05/2012   Chlorhexidine Itching and Rash 07/09/2022   Vancomycin Rash 09/05/2012    Review of Systems: A complete ROS was negative except as per HPI.   OBJECTIVE:   Physical Exam: Blood pressure 136/85, pulse (!) 124, temperature 98.8 F (37.1 C), temperature source Axillary, resp. rate 11, last menstrual period 08/23/2011, SpO2 100%.  Constitutional: Lethargic appearing female, in no acute  distress HENT: normocephalic atraumatic, mucous membranes moist Eyes: conjunctiva non-erythematous Neck: supple Cardiovascular: regular rate and rhythm, no m/r/g Pulmonary/Chest: normal work of breathing on room air, lungs clear to auscultation bilaterally Abdominal: soft, non-tender, non-distended MSK: normal bulk and tone Neurological: alert & oriented x 3, 5/5 strength in bilateral upper and lower extremities, normal gait Skin: warm and dry   Labs: CBC    Component Value Date/Time   WBC 9.6 01/23/2023 1846   RBC 2.76 (L) 01/23/2023 1846   HGB 9.5 (L) 01/23/2023 1857   HGB 12.4 04/27/2022 1125   HCT 28.0 (L) 01/23/2023 1857   HCT 37.6 04/27/2022 1125   PLT 243 01/23/2023 1846   PLT 210 04/27/2022 1125   MCV 98.9 01/23/2023 1846   MCV 97 04/27/2022 1125   MCH 31.5 01/23/2023 1846   MCHC 31.9 01/23/2023 1846   RDW 17.8 (H) 01/23/2023 1846   RDW 17.0 (H) 04/27/2022 1125   LYMPHSABS 4.3 (H) 01/23/2023 1846   LYMPHSABS 1.5 04/27/2022 1125   MONOABS 0.5 01/23/2023 1846   EOSABS 0.0 01/23/2023 1846   EOSABS 0.0 04/27/2022 1125   BASOSABS 0.1 01/23/2023 1846   BASOSABS 0.0 04/27/2022 1125     CMP     Component Value Date/Time   NA 141 01/23/2023 1857   NA 144 04/27/2022 1125   K 3.7 01/23/2023 1857   CL 109 01/23/2023 1846   CO2 11 (L) 01/23/2023 1846   GLUCOSE 76 01/23/2023 1846   BUN 9 01/23/2023 1846   BUN 7 04/27/2022 1125   CREATININE 0.99 01/23/2023 1846   CREATININE 1.00 03/21/2014 1556   CALCIUM 8.4 (L) 01/23/2023 1846   PROT 5.7 (L) 01/05/2023 1517   PROT 7.1 04/27/2022 1125   ALBUMIN 2.2 (L) 01/05/2023 1517   ALBUMIN 3.5 (L) 04/27/2022 1125   AST 34 01/05/2023 1517   ALT 31 01/05/2023 1517   ALKPHOS 89 01/05/2023 1517   BILITOT 0.6 01/05/2023 1517   BILITOT 0.6 04/27/2022 1125   GFRNONAA >60 01/23/2023 1846   GFRNONAA >89 12/11/2013 1214   GFRAA 61 01/02/2020 1100   GFRAA >89 12/11/2013 1214    Imaging:  No Imaging obtained  EKG: personally  reviewed my interpretation is sinus tachycardia, no axis deviation, normal intervals, similar to previous EKG  ASSESSMENT & PLAN:   Assessment & Plan by Problem: Principal Problem:   Breakthrough seizure (HCC)   ASHELEY TONGATE is a 57 y.o. person living with a history of seizures, alcohol use disorder, HTN who presented with seizure like activity and admitted for breakthrough seizures on hospital day 0  #Breakthrough Seizure  #Epilepsy Etiology likely in the setting of not taking Keppra appropriately as well as current alcohol use. No infectious causes at this time, and pt's neurological exam is within normal limits so do not believe imaging is warranted. Glucose on BMP was 76, and unclear if pt is eating a normal diet given history of pharyngeal dysphagia, so hypoglycemia may be playing a role here as well. She is s/p loading dose of Keppra. Neurology believes this may be secondary to alcohol withdrawal  as well.  - Resume home Keppra 750mg  BID  #Alcohol Use Disorder  #Hx of Delirium Tremens Last drink this morning (12/1), will need further clarification on amount of alcohol that is being consumed on a daily basis. Given history of Delirium Tremens that required ICU admission in August 2024, will start CIWA with ativan.  -Folic acid/Thiamine/MV  #Anion Gap Metabolic Acidosis Acute in the setting of seizures, AG currently elevated at 20, and bicarb low at 11. Expect this to resolve with adequate seizure treatment as well as PO intake.    Chronic Medical Issues #Hypocalcemia Seems to be a chronic issue, with most recent calcium at 8.4. Will obtain a CMP in the morning to evaluate for hypoalbuminemia as well and assess corrected calcium.    #Normocytic Anemia  Chronic issue for her, Hb currently at 8.7. She has attempted to undergo colonoscopy while inpatient in November however pt could not tolerate bowel prep per chart review. Would recommend outpatient GI referral.    #Idiopathic  Chronic Gout of Right Knee Without Tophus  - Continuing allopurinol and colchicine    Diet: Clear Liquid VTE: Enoxaparin Code: Full  Dispo: Admit patient to Observation with expected length of stay less than 2 midnights.  Signed: Carmina Miller, D.O. Internal Medicine Resident PGY-1 01/23/2023, 11:04 PM

## 2023-01-24 ENCOUNTER — Other Ambulatory Visit (HOSPITAL_COMMUNITY): Payer: Self-pay

## 2023-01-24 DIAGNOSIS — G40919 Epilepsy, unspecified, intractable, without status epilepticus: Secondary | ICD-10-CM

## 2023-01-24 DIAGNOSIS — F1090 Alcohol use, unspecified, uncomplicated: Secondary | ICD-10-CM | POA: Diagnosis not present

## 2023-01-24 LAB — COMPREHENSIVE METABOLIC PANEL
ALT: 21 U/L (ref 0–44)
AST: 37 U/L (ref 15–41)
Albumin: 2.3 g/dL — ABNORMAL LOW (ref 3.5–5.0)
Alkaline Phosphatase: 117 U/L (ref 38–126)
Anion gap: 10 (ref 5–15)
BUN: 11 mg/dL (ref 6–20)
CO2: 21 mmol/L — ABNORMAL LOW (ref 22–32)
Calcium: 8 mg/dL — ABNORMAL LOW (ref 8.9–10.3)
Chloride: 111 mmol/L (ref 98–111)
Creatinine, Ser: 0.88 mg/dL (ref 0.44–1.00)
GFR, Estimated: >60 mL/min (ref >60)
Glucose, Bld: 91 mg/dL (ref 70–99)
Potassium: 3.9 mmol/L (ref 3.5–5.1)
Sodium: 142 mmol/L (ref 135–145)
Total Bilirubin: 0.7 mg/dL (ref <1.2)
Total Protein: 5.3 g/dL — ABNORMAL LOW (ref 6.5–8.1)

## 2023-01-24 LAB — CBC
HCT: 23.3 % — ABNORMAL LOW (ref 36.0–46.0)
Hemoglobin: 7.7 g/dL — ABNORMAL LOW (ref 12.0–15.0)
MCH: 31.7 pg (ref 26.0–34.0)
MCHC: 33 g/dL (ref 30.0–36.0)
MCV: 95.9 fL (ref 80.0–100.0)
Platelets: 170 10*3/uL (ref 150–400)
RBC: 2.43 MIL/uL — ABNORMAL LOW (ref 3.87–5.11)
RDW: 17.2 % — ABNORMAL HIGH (ref 11.5–15.5)
WBC: 6.7 10*3/uL (ref 4.0–10.5)
nRBC: 0 % (ref 0.0–0.2)

## 2023-01-24 MED ORDER — SODIUM CHLORIDE 0.9 % IV SOLN
INTRAVENOUS | Status: DC
  Administered 2023-01-24: 100 mL/h via INTRAVENOUS

## 2023-01-24 MED ORDER — ACETAMINOPHEN 325 MG PO TABS
650.0000 mg | ORAL_TABLET | Freq: Four times a day (QID) | ORAL | Status: DC | PRN
  Administered 2023-01-24: 650 mg via ORAL
  Filled 2023-01-24: qty 2

## 2023-01-24 MED ORDER — ALLOPURINOL 100 MG PO TABS
200.0000 mg | ORAL_TABLET | Freq: Every day | ORAL | Status: DC
  Administered 2023-01-24: 200 mg via ORAL
  Filled 2023-01-24: qty 2

## 2023-01-24 MED ORDER — COLCHICINE 0.6 MG PO TABS: 0.6000 mg | ORAL_TABLET | Freq: Every day | ORAL | Status: DC

## 2023-01-24 MED ORDER — PANTOPRAZOLE SODIUM 40 MG PO TBEC
40.0000 mg | DELAYED_RELEASE_TABLET | Freq: Every day | ORAL | Status: DC
  Administered 2023-01-24: 40 mg via ORAL
  Filled 2023-01-24: qty 1

## 2023-01-24 MED ORDER — LACTATED RINGERS IV SOLN: INTRAVENOUS | Status: DC

## 2023-01-24 MED ORDER — LEVETIRACETAM 750 MG PO TABS
750.0000 mg | ORAL_TABLET | Freq: Two times a day (BID) | ORAL | 2 refills | Status: DC
  Filled 2023-01-24: qty 60, 30d supply, fill #0

## 2023-01-24 MED ORDER — ALLOPURINOL 100 MG PO TABS: 200.0000 mg | ORAL_TABLET | Freq: Every day | ORAL | 3 refills | Status: DC

## 2023-01-24 NOTE — Discharge Instructions (Addendum)
Ms Christina Byrd, Christina Byrd were hospitalized for seizures. You were restarted on your home seizure medications, and are at this time doing much better. I feel comfortable discharging you from the hospital. Thank you for allowing [place] to be part of your care.   We arranged for you to follow up at:  [facility] Dec 17 at 8:45am with Dr Carlynn Purl  Please make sure to take your Keppra 750mg  twice per day   Please return to the ED if you have worsening seizure activity or would be interested in quitting alcohol.   Please call our clinic if you have any questions or concerns, we may be able to help and keep you from a long and expensive emergency room wait. Our clinic and after hours phone number is [phone], the best time to call is Monday through Friday 9 am to 4 pm but there is always someone available 24/7 if you have an emergency. If you need medication refills please notify your pharmacy one week in advance and they will send [place] a request.                                       Outpatient Substance Abuse  Treatment- uninsured  Narcotics Anonymous 24-HOUR HELPLINE Pre-recorded for Meeting Schedules [facility] [phone]  [email or web address] ALCOHOLICS ANONYMOUS  [facility]  Answering Service [phone] Please Note: All [place] Meetings are Non-smoking [email or web address]  Alcohol and Drug Services -  Insurance: Medicaid /State funding/private insurance Methadone, suboxone/Intensive outpatient  [place]   [phone] Fax: [phone] [address] [place], [place], [address] [place] [phone] Fax: [phone]    [address] [place], [place], [address] ([address] [place], [place], [place], [place], [place], [place], [place], [place]) [facility] [email or web address] Accepts State funding/Medicaid Transitional housing, Intensive Outpatient Treatment, Outpatient treatment, Veterans Services  Phone: [phone] Fax: [phone] Address: [address], [place] [address]   [facility] ([email or web address].[email or web address]/) Insurance: Medicaid Case Management, Administrator, arts, Medication Management, Outpatient Therapy, Psychosocial Rehabilitation, Substance Abuse Intensive Outpatient  Phone: [phone] Fax: [phone] [address], [place], [place], [address]  [facility] Medicaid, most private insurance providers Types of Program: Individual/Group Therapy, Substance Abuse Treatment  Phone: [place] [phone] Fax: [phone] [address], [place], [place], [address] [facility] [phone] [address] [place], [place], [address]  [facility]  Medicaid Types of Program: SAIOP  Phone: [phone] Fax: [phone] [address] [place], [place], [place], [address] [facility] Medicaid/state funds Crisis line [phone] [place] [phone] [place] [phone] [place] [phone]  [facility] [address];  [place], [place] [address] [phone]  Substance Abuse Intensive Outpatient Program [facility] [address] [place], [place] [address] [phone]- Substance abuse treatment  Successful Transitions  Insurance: [facility], [address], sliding scale Types of Program: substance abuse treatment, transportation assistance Phone: [phone] Fax: [phone] Address: [address], [place] [address] [facility] ([email or web address]) Insurance: UHC, [place], [place], [place] Program: addiction counseling, detoxification,  Phone: [phone]  Fax: [phone] Address: [address], [place] [address]  [facility] (statewide facilities/programs) [address] (Medicaid/state funds) [place], [place] [address]  http://[email or web address]/ [phone]) [phone]-  [phone] Marcy Panning- [phone] [place]- [phone] [facility] [place] (2 Locations) (Medicaid/state funds) --[address]  walk in 8:30-12 and 1-2:30 [place], [id number]   [facility]- [phone] --[address] [place], [place] [address]  NU-[phone] walk in 8:30-12 and 2-3:30  [facility] state funds/medicaid [address] [place], [place] [address] [id number] [facility] (Suboxone clinic) Medicaid/state funds  [address] [place], [place] [address] [phone]   [facility]  [address], [place], [place] [address]  [phone] (24 hours) [place]- [address] [place], [place] [address]  [phone] (24 hours) [place]- [address] [place] [phone] [place]- [address] Rosalita Levan [phone] Turner Daniels [address] [place] [phone] [facility]- Medicaid and state funds  [place]- [address] [place], [place] [address] [phone] (24 hours) [place]- [address] [place], [place] [address] [phone] [facility]- [address] [place], [place] [address] [phone] (24 hours) [place] [address], [place]  [address] [phone] [place]- [address] [place] [phone]

## 2023-01-24 NOTE — Progress Notes (Signed)
Pt discharged via wheelchair without incident.

## 2023-01-24 NOTE — ED Notes (Signed)
ED TO INPATIENT HANDOFF REPORT  ED Nurse Name and Phone #: Gustavo Lah, rn [phone]  S Name/Age/Gender Christina Byrd 57 y.o. female Room/Bed: 006C/006C  Code Status   Code Status: Full Code  Home/SNF/Other Home Patient oriented to: self, place, time, and situation Is this baseline? Yes   Triage Complete: Triage complete  Chief Complaint Breakthrough seizure [facility]) [G40.919]  Triage Note Patient arrived by [facility] from home with seizure today. Spouse reports that patient ha hx of and every few months has a series of seizures. Today had 2 prior to EMS arrival and than another seizure in ems presence lasting about 90 seconds. Ems administered versed 2.5 pta.  Arrived on NRBM and post-ictal. No signs of trauma. Cbg 77   Allergies Allergies  Allergen Reactions   Losartan Potassium     Rash   Levaquin [Levofloxacin In D5w] Rash   Ace Inhibitors Cough    REACTION: cough   Codeine Nausea Only   Cefepime Rash    09/04/12 pm Patient started to break out with small macules after IV Vanco infusion, then macules increased in size after starting cefepime infusion.   Chlorhexidine Itching and Rash   Vancomycin Rash    09/04/12 pm Patient started to break out with small macules after IV Vanco infusion, then macules increased in size after starting cefepime infusion.    Level of Care/Admitting Diagnosis ED Disposition     ED Disposition  Admit   Condition  --   Comment  Hospital Area: [facility] [[id number]]  Level of Care: Telemetry Medical [104]  May admit patient to [facility] or [facility] if equivalent level of care is available:: No  Covid Evaluation: Asymptomatic - no recent exposure (last 10 days) testing not required  Diagnosis: Breakthrough seizure [facility]) [[id number]]  Admitting Physician: Mercie Eon [[id number]]  Attending Physician: Mercie Eon [[id number]]  Certification:: I certify this patient will need inpatient services for at least 2 midnights   Expected Medical Readiness: 01/25/2023          B Medical/Surgery History Past Medical History:  Diagnosis Date   GASTROESOPHAGEAL REFLUX, NO ESOPHAGITIS 04/21/2006   Qualifier: Diagnosis of  By: Levada Schilling     GERD (gastroesophageal reflux disease) Dx 1995   Hot flash, menopausal 06/22/2016   Hot flashes 01/30/2020   HTN (hypertension)    Hypotension 10/21/2022   Patient's blood pressure at presentation was 87/65, follow up blood pressure was 81/58. Patient reports regurgitating everything she has taken po for the past month. Denies lightheadedness or dizziness. Discussed GI referral, though patient stated she would be going to the emergency room today for IV fluids.   -GI referral for upper endoscopy placed  -Patient planning on going to the ED for IV flu   Intertrigo 06/22/2016   Nausea & vomiting 12/23/2022   Nausea and vomiting 12/24/2022   Pain and swelling of left ankle 12/02/2014   Seizures (HCC)    Tendinitis of right hip flexor 07/23/2014   Tendonitis, Achilles, right 04/01/2014   Past Surgical History:  Procedure Laterality Date   BIOPSY  06/03/2022   Procedure: BIOPSY;  Surgeon: Jenel Lucks, MD;  Location: [facility] ENDOSCOPY;  Service: Gastroenterology;;   ESOPHAGOGASTRODUODENOSCOPY N/A 12/29/2022   Procedure: ESOPHAGOGASTRODUODENOSCOPY (EGD);  Surgeon: Napoleon Form, MD;  Location: [facility] ENDOSCOPY;  Service: Gastroenterology;  Laterality: N/A;   ESOPHAGOGASTRODUODENOSCOPY (EGD) WITH PROPOFOL N/A 06/03/2022   Procedure: ESOPHAGOGASTRODUODENOSCOPY (EGD) WITH PROPOFOL;  Surgeon: Jenel Lucks, MD;  Location: [facility] ENDOSCOPY;  Service: Gastroenterology;  Laterality: N/A;   HOT HEMOSTASIS N/A 06/03/2022   Procedure: HOT HEMOSTASIS (ARGON PLASMA COAGULATION/BICAP);  Surgeon: Jenel Lucks, MD;  Location: [facility] ENDOSCOPY;  Service: Gastroenterology;  Laterality: N/A;     A IV Location/Drains/Wounds Patient Lines/Drains/Airways Status     Active  Line/Drains/Airways     Name Placement date Placement time Site Days   Peripheral IV 01/23/23 22 G Anterior;Left Forearm 01/23/23  1810  Forearm  1   Wound / Incision (Open or Dehisced) 12/23/22 Irritant Dermatitis (Moisture Associated Skin Damage) Other (Comment) Right;Left 12/23/22  2110  Other (Comment)  32            Intake/Output Last 24 hours  Intake/Output Summary (Last 24 hours) at 01/24/2023 1215 Last data filed at 01/24/2023 1158 Gross per 24 hour  Intake 1131.39 ml  Output --  Net 1131.39 ml    Labs/Imaging Results for orders placed or performed during the hospital encounter of 01/23/23 (from the past 48 hour(s))  CBG monitoring, ED     Status: None   Collection Time: 01/23/23  6:36 PM  Result Value Ref Range   Glucose-Capillary 77 70 - 99 mg/dL    Comment: Glucose reference range applies only to samples taken after fasting for at least 8 hours.  Basic metabolic panel     Status: Abnormal   Collection Time: 01/23/23  6:46 PM  Result Value Ref Range   Sodium 140 135 - 145 mmol/L   Potassium 3.7 3.5 - 5.1 mmol/L   Chloride 109 98 - 111 mmol/L   CO2 11 (L) 22 - 32 mmol/L   Glucose, Bld 76 70 - 99 mg/dL    Comment: Glucose reference range applies only to samples taken after fasting for at least 8 hours.   BUN 9 6 - 20 mg/dL   Creatinine, Ser 4.69 0.44 - 1.00 mg/dL   Calcium 8.4 (L) 8.9 - 10.3 mg/dL   GFR, Estimated >62 >95 mL/min    Comment: (NOTE) Calculated using the CKD-EPI Creatinine Equation (2021)    Anion gap 20 (H) 5 - 15    Comment: Performed at [facility], [address] [place], [place] [address]  CBC with Differential     Status: Abnormal   Collection Time: 01/23/23  6:46 PM  Result Value Ref Range   WBC 9.6 4.0 - 10.5 K/uL   RBC 2.76 (L) 3.87 - 5.11 MIL/uL   Hemoglobin 8.7 (L) 12.0 - 15.0 g/dL   HCT 24.4 (L) 01.0 - 27.2 %   MCV 98.9 80.0 - 100.0 fL   MCH 31.5 26.0 - 34.0 pg   MCHC 31.9 30.0 - 36.0 g/dL   RDW 53.6 (H) 64.4 - 03.4 %    Platelets 243 150 - 400 K/uL   nRBC 0.0 0.0 - 0.2 %   Neutrophils Relative % 48 %   Neutro Abs 4.7 1.7 - 7.7 K/uL   Lymphocytes Relative 44 %   Lymphs Abs 4.3 (H) 0.7 - 4.0 K/uL   Monocytes Relative 6 %   Monocytes Absolute 0.5 0.1 - 1.0 K/uL   Eosinophils Relative 0 %   Eosinophils Absolute 0.0 0.0 - 0.5 K/uL   Basophils Relative 1 %   Basophils Absolute 0.1 0.0 - 0.1 K/uL   Immature Granulocytes 1 %   Abs Immature Granulocytes 0.06 0.00 - 0.07 K/uL    Comment: Performed at [facility], [address] [place], [place] [address]  Ethanol  Status: Abnormal   Collection Time: 01/23/23  6:46 PM  Result Value Ref Range   Alcohol, Ethyl (B) 81 (H) <10 mg/dL    Comment: (NOTE) Lowest detectable limit for serum alcohol is 10 mg/dL.  For medical purposes only. Performed at [facility], [address] [place], [place] [address]   I-Stat venous blood gas, ED     Status: Abnormal   Collection Time: 01/23/23  6:57 PM  Result Value Ref Range   pH, Ven 7.196 (LL) 7.25 - 7.43   pCO2, Ven 25.3 (L) 44 - 60 mmHg   pO2, Ven 82 (H) 32 - 45 mmHg   Bicarbonate 9.8 (L) 20.0 - 28.0 mmol/L   TCO2 11 (L) 22 - 32 mmol/L   O2 Saturation 93 %   Acid-base deficit 17.0 (H) 0.0 - 2.0 mmol/L   Sodium 141 135 - 145 mmol/L   Potassium 3.7 3.5 - 5.1 mmol/L   Calcium, Ion 1.11 (L) 1.15 - 1.40 mmol/L   HCT 28.0 (L) 36.0 - 46.0 %   Hemoglobin 9.5 (L) 12.0 - 15.0 g/dL   Sample type VENOUS   CBC     Status: Abnormal   Collection Time: 01/24/23  4:34 AM  Result Value Ref Range   WBC 6.7 4.0 - 10.5 K/uL   RBC 2.43 (L) 3.87 - 5.11 MIL/uL   Hemoglobin 7.7 (L) 12.0 - 15.0 g/dL   HCT 85.2 (L) 77.8 - 24.2 %   MCV 95.9 80.0 - 100.0 fL   MCH 31.7 26.0 - 34.0 pg   MCHC 33.0 30.0 - 36.0 g/dL   RDW 35.3 (H) 61.4 - 43.1 %   Platelets 170 150 - 400 K/uL   nRBC 0.0 0.0 - 0.2 %    Comment: Performed at [facility], [address] [place], [place] [address]  Comprehensive metabolic panel      Status: Abnormal   Collection Time: 01/24/23  4:34 AM  Result Value Ref Range   Sodium 142 135 - 145 mmol/L   Potassium 3.9 3.5 - 5.1 mmol/L   Chloride 111 98 - 111 mmol/L   CO2 21 (L) 22 - 32 mmol/L   Glucose, Bld 91 70 - 99 mg/dL    Comment: Glucose reference range applies only to samples taken after fasting for at least 8 hours.   BUN 11 6 - 20 mg/dL   Creatinine, Ser 6.76 0.44 - 1.00 mg/dL   Calcium 8.0 (L) 8.9 - 10.3 mg/dL   Total Protein 5.3 (L) 6.5 - 8.1 g/dL   Albumin 2.3 (L) 3.5 - 5.0 g/dL   AST 37 15 - 41 U/L   ALT 21 0 - 44 U/L   Alkaline Phosphatase 117 38 - 126 U/L   Total Bilirubin 0.7 <1.2 mg/dL   GFR, Estimated >19 >50 mL/min    Comment: (NOTE) Calculated using the CKD-EPI Creatinine Equation (2021)    Anion gap 10 5 - 15    Comment: Performed at [facility], [address] [place], [place] [address]   No results found.  Pending Labs Unresulted Labs (From admission, onward)     Start     Ordered   01/23/23 1846  Levetiracetam level  Once,   URGENT        01/23/23 1845            Vitals/Pain Today's Vitals   01/24/23 0717 01/24/23 0800 01/24/23 0912 01/24/23 1100  BP:  (!) 125/111 (!) 116/95 130/89  Pulse:  (!) 108 85 95  Resp:   17 18  Temp:   98.4 F (36.9 C)   TempSrc:   Oral   SpO2:   100% 97%  PainSc: Asleep       Isolation Precautions No active isolations  Medications Medications  levETIRAcetam (KEPPRA) 750 mg in sodium chloride 0.9 % 100 mL IVPB (0 mg Intravenous Stopped 01/24/23 1158)  LORazepam (ATIVAN) tablet 1-4 mg (1 mg Oral Not Given 01/24/23 0912)    Or  LORazepam (ATIVAN) injection 1-4 mg ( Intravenous See Alternative 01/24/23 0912)  thiamine (VITAMIN B1) tablet 100 mg (100 mg Oral Given 01/24/23 0911)    Or  thiamine (VITAMIN B1) injection 100 mg ( Intravenous See Alternative 01/24/23 0911)  folic acid (FOLVITE) tablet 1 mg (1 mg Oral Given 01/24/23 0928)  multivitamin with minerals tablet 1 tablet (1 tablet Oral Given  01/24/23 0910)  allopurinol (ZYLOPRIM) tablet 200 mg (200 mg Oral Given 01/24/23 0928)  pantoprazole (PROTONIX) EC tablet 40 mg (40 mg Oral Given 01/24/23 0929)  acetaminophen (TYLENOL) tablet 650 mg (650 mg Oral Given 01/24/23 0606)  PHENObarbital (LUMINAL) injection 260 mg (260 mg Intravenous Given 01/23/23 2006)  levETIRAcetam (KEPPRA) IVPB 1000 mg/100 mL premix (0 mg Intravenous Stopped 01/23/23 2000)    Followed by  levETIRAcetam (KEPPRA) IVPB 1000 mg/100 mL premix (0 mg Intravenous Stopped 01/23/23 2016)    Followed by  levETIRAcetam (KEPPRA) IVPB 1500 mg/ 100 mL premix (0 mg Intravenous Stopped 01/23/23 2215)    Mobility walks with person assist     Focused Assessments Neuro Assessment Handoff:  Swallow screen pass? Yes  Cardiac Rhythm: Sinus tachycardia       Neuro Assessment:   Neuro Checks:      Has TPA been given? No If patient is a Neuro Trauma and patient is going to OR before floor call report to 4N Charge nurse: [phone] or [phone]   R Recommendations: See Admitting Provider Note  Report given to:   Additional Notes:

## 2023-01-24 NOTE — ED Notes (Addendum)
LR order to be switched to NS after talking with Dr. Thomasene Ripple.

## 2023-01-24 NOTE — Progress Notes (Signed)
CSW received consult for patient for substance abuse resources - resources were added to AVS.  Edwin Dada, MSW, LCSW Transitions of Care  Clinical Social Worker II [phone]

## 2023-01-24 NOTE — Telephone Encounter (Signed)
Spoke with pt family she is in the hospital she has been drink heavy she had 2 bad seizures yesterday

## 2023-01-24 NOTE — TOC Transition Note (Signed)
Transition of Care [facility]) - CM/SW Discharge Note   Patient Details  Name: Christina Byrd MRN: [id number] Date of Birth: 01/20/1966  Transition of Care [facility]) CM/SW Contact:  Kermit Balo, RN Phone Number: 01/24/2023, 2:16 PM   Clinical Narrative:     Pt is discharging home with self care. No needs per TOC.  Final next level of care: Home/Self Care Barriers to Discharge: No Barriers Identified   Patient Goals and CMS Choice      Discharge Placement                         Discharge Plan and Services Additional resources added to the After Visit Summary for                                       Social Determinants of Health (SDOH) Interventions SDOH Screenings   Food Insecurity: No Food Insecurity (01/24/2023)  Housing: Low Risk  (01/24/2023)  Transportation Needs: No Transportation Needs (01/24/2023)  Utilities: Not At Risk (01/24/2023)  Depression (PHQ2-9): Low Risk  (12/23/2022)  Tobacco Use: Medium Risk (01/23/2023)     Readmission Risk Interventions    11/21/2022    1:01 PM  Readmission Risk Prevention Plan  Transportation Screening Complete  Medication Review (RN Care Manager) Complete  PCP or Specialist appointment within 3-5 days of discharge Complete  HRI or Home Care Consult Complete  Palliative Care Screening Not Applicable  Skilled Nursing Facility Not Applicable

## 2023-01-24 NOTE — Discharge Summary (Signed)
Name: Christina Byrd MRN: [id number] DOB: [birth year] 57 y.o. PCP: [name], DO  Date of Admission: 01/23/2023  6:32 PM Date of Discharge: 01/24/23 Attending Physician: Dr. Lafonda Mosses  Discharge Diagnosis: Principal Problem:   Breakthrough seizure [facility])    Discharge Medications: Allergies as of 01/24/2023       Reactions   Losartan Potassium    Rash   Levaquin [levofloxacin In D5w] Rash   Ace Inhibitors Cough   REACTION: cough   Codeine Nausea Only   Cefepime Rash   09/04/12 pm Patient started to break out with small macules after IV Vanco infusion, then macules increased in size after starting cefepime infusion.   Chlorhexidine Itching, Rash   Vancomycin Rash   09/04/12 pm Patient started to break out with small macules after IV Vanco infusion, then macules increased in size after starting cefepime infusion.        Medication List     TAKE these medications    allopurinol 100 MG tablet Commonly known as: ZYLOPRIM Take 2 tablets (200 mg total) by mouth daily. Pt takes 1 tablet daily. What changed: additional instructions   atorvastatin 20 MG tablet Commonly known as: LIPITOR Take 20 mg by mouth daily.   CertaVite/Antioxidants Tabs Take 1 tablet by mouth daily.   colchicine 0.6 MG tablet Take 1 tablet (0.6 mg total) by mouth daily.   feeding supplement Liqd Take 237 mLs by mouth 3 (three) times daily between meals.   folic acid 1 MG tablet Commonly known as: FOLVITE Take 1 tablet (1 mg total) by mouth daily.   Gerhardt's butt cream Crea Apply 1 Application topically 4 (four) times daily -  before meals and at bedtime.   hydrocortisone cream 1 % Apply 1 Application topically 2 (two) times daily.   levETIRAcetam 750 MG tablet Commonly known as: KEPPRA Take 1 tablet (750 mg total) by mouth 2 (two) times daily.   pantoprazole 40 MG tablet Commonly known as: Protonix Take 1 tablet (40 mg total) by mouth daily.   thiamine 100 MG  tablet Commonly known as: VITAMIN B1 Take 1 tablet (100 mg total) by mouth daily.   Vitamin D (Ergocalciferol) 1.25 MG (50000 UNIT) Caps capsule Commonly known as: DRISDOL Take 1 capsule by mouth once a week. Every Wednesday starting 11/13   Zinc Oxide 12.8 % ointment Commonly known as: TRIPLE PASTE Apply topically as needed for irritation. In your buttocks        Disposition and follow-up:   [name] was discharged from [facility] in Stable condition.  At the hospital follow up visit please address:  1.  Follow-up:  a. Seizures: ensure patient has been taking Keppra 750mg  BID. Ask about other seizure activity    b. AUD: patient has endorsed desire to quit drinking, would likely need admission to do so safely. Ask about any plans to quit   C. Normocytic anemia: Hgb chronically low to ~7-8. Ensure patient is up to date on age appropriate cancer screening.  2.  Labs / imaging needed at time of follow-up: None  3.  Pending labs/ test needing follow-up: None  4.  Medication Changes: None  Follow-up Appointments: [facility], Dec 17 at 8:45 with Dr [facility] Course by problem list:   #Breakthrough seizure #Epilepsy Patient presenting after multiple tonic-clonic seizures, in the setting of not taking appropriate dose of Keppra as well as current alcohol use. In the ED patient was post-ictal, otherwise HDS with unremarkable exam.  Patient received loading dose of Keppra and was restarted on home dose of 750mg  BID. No further seizure activity. Seeing her this afternoon patient was alert and oriented, asymptomatic, and desiring to go home. Patient was discharged on 01/24/23.    #Alcohol use disorder Patient with history of AUD and previous admission for DT. Reported her last drink was day of admission, unable to quantify how much she has been drinking due to postictal state. Patient was initiated on CIWA protocol with Ativan. CIWA scores remained low and  patient did not require Ativan. Counseled on alcohol cessation and likely need for admission if she does desire to quit in the future.    #Anion gap metabolic acidosis Anion gap 20, bicarb 11. Possibly due to elevated lactate in setting of seizures and possibly decreased oral intake from chronic pharyngeal dysphagia. Patient received IV fluid infusion.     #Normocytic anemia Hgb 8.7 -> 7.7. Chronic issue for her with baseline ~7-8. Follow up in outpatient setting.     Discharge Subjective: Patient was feeling well this afternoon. Was very eager when I suggested going home today. She stated she would take her Keppra twice daily as prescribed. Endorsed a desire to quit drinking, which I told her would likely require admission given her history of DT. Patient amenable to plan.   Discharge Exam:   BP (!) 145/80 (BP Location: Right Arm)   Pulse 98   Temp 98.7 F (37.1 C) (Oral)   Resp 16   Ht 5\' 2"  (1.575 m)   Wt 57.2 kg   LMP 08/23/2011   SpO2 100%   BMI 23.05 kg/m  Constitutional: in no acute distress HENT: normocephalic atraumatic, mucous membranes moist Eyes: conjunctiva non-erythematous Neck: supple Cardiovascular: regular rate and rhythm, no m/r/g Pulmonary/Chest: normal work of breathing on room air, lungs clear to auscultation bilaterally Abdominal: soft, non-tender, non-distended MSK: normal bulk and tone Neurological: alert & oriented x 3, 5/5 strength in bilateral upper and lower extremities, normal gait Skin: warm and dry  Pertinent Labs, Studies, and Procedures:     Latest Ref Rng & Units 01/24/2023    4:34 AM 01/23/2023    6:57 PM 01/23/2023    6:46 PM  CBC  WBC 4.0 - 10.5 K/uL 6.7   9.6   Hemoglobin 12.0 - 15.0 g/dL 7.7  9.5  8.7   Hematocrit 36.0 - 46.0 % 23.3  28.0  27.3   Platelets 150 - 400 K/uL 170   243        Latest Ref Rng & Units 01/24/2023    4:34 AM 01/23/2023    6:57 PM 01/23/2023    6:46 PM  CMP  Glucose 70 - 99 mg/dL 91   76   BUN 6 - 20 mg/dL  11   9   Creatinine 7.25 - 1.00 mg/dL 3.66   4.40   Sodium 347 - 145 mmol/L 142  141  140   Potassium 3.5 - 5.1 mmol/L 3.9  3.7  3.7   Chloride 98 - 111 mmol/L 111   109   CO2 22 - 32 mmol/L 21   11   Calcium 8.9 - 10.3 mg/dL 8.0   8.4   Total Protein 6.5 - 8.1 g/dL 5.3     Total Bilirubin <1.2 mg/dL 0.7     Alkaline Phos 38 - 126 U/L 117     AST 15 - 41 U/L 37     ALT 0 - 44 U/L 21  Signed: Monna Fam, MD PGY-1 01/24/2023, 1:43 PM   Pager: [phone]

## 2023-01-24 NOTE — Progress Notes (Signed)
DC instructions and med list reviewed with pt.  Pt to pick up TOC meds at discharge.  IV removed.  SO on the way to pick up the patient and will be bringing clothing back.

## 2023-01-25 ENCOUNTER — Telehealth: Payer: Self-pay

## 2023-01-25 LAB — LEVETIRACETAM LEVEL: Levetiracetam Lvl: <2 ug/mL — ABNORMAL LOW (ref 10.0–40.0)

## 2023-01-25 NOTE — Transitions of Care (Post Inpatient/ED Visit) (Unsigned)
   01/25/2023  Name: Christina Byrd MRN: [id number] DOB: 04-15-1965  Today's TOC FU Call Status: Today's TOC FU Call Status:: Unsuccessful Call (1st Attempt) Unsuccessful Call (1st Attempt) Date: 01/25/23  Attempted to reach the patient regarding the most recent Inpatient/ED visit.  Follow Up Plan: Additional outreach attempts will be made to reach the patient to complete the Transitions of Care (Post Inpatient/ED visit) call.   Signature Karena Addison, LPN [facility] Nurse Health Advisor Direct Dial [phone]

## 2023-01-26 NOTE — Transitions of Care (Post Inpatient/ED Visit) (Signed)
01/26/2023  Name: Christina Byrd MRN: [id number] DOB: 12/18/1965  Today's TOC FU Call Status: Today's TOC FU Call Status:: Successful TOC FU Call Completed Unsuccessful Call (1st Attempt) Date: 01/25/23 [facility] FU Call Complete Date: 01/26/23 Patient's Name and Date of Birth confirmed.  Transition Care Management Follow-up Telephone Call Date of Discharge: 01/24/23 Discharge Facility: [facility]) Type of Discharge: Inpatient Admission Primary Inpatient Discharge Diagnosis:: convulsion How have you been since you were released from the hospital?: Better Any questions or concerns?: No  Items Reviewed: Did you receive and understand the discharge instructions provided?: Yes Medications obtained,verified, and reconciled?: Yes (Medications Reviewed) Any new allergies since your discharge?: No Dietary orders reviewed?: Yes Do you have support at home?: Yes People in Home: spouse  Medications Reviewed Today: Medications Reviewed Today     Reviewed by Karena Addison, LPN (Licensed Practical Nurse) on 01/26/23 at 1551  Med List Status: <None>   Medication Order Taking? Sig Documenting Provider Last Dose Status Informant  allopurinol (ZYLOPRIM) 100 MG tablet [id number]  Take 2 tablets (200 mg total) by mouth daily. Monna Fam, MD  Active   atorvastatin (LIPITOR) 20 MG tablet [id number] No Take 20 mg by mouth daily. [provider] Past Month Active Spouse/Significant Other, Pharmacy Records  colchicine 0.6 MG tablet [id number] No Take 1 tablet (0.6 mg total) by mouth daily.  Patient not taking: Reported on 01/24/2023   Chauncey Mann, DO Not Taking Active Spouse/Significant Other, Pharmacy Records  ergocalciferol (VITAMIN D2) 1.25 MG (50000 UT) capsule [id number] No Take 1 capsule by mouth once a week. Every Wednesday starting 11/13 Gwenevere Abbot, MD Unk Active Spouse/Significant Other, Pharmacy Records  feeding supplement (ENSURE ENLIVE / ENSURE PLUS) LIQD [id number] No Take  237 mLs by mouth 3 (three) times daily between meals.  Patient not taking: Reported on 01/24/2023   Gwenevere Abbot, MD Not Taking Active Spouse/Significant Other, Pharmacy Records  folic acid (FOLVITE) 1 MG tablet [id number] No Take 1 tablet (1 mg total) by mouth daily. Rhetta Mura, MD Past Week Active Spouse/Significant Other, Pharmacy Records  hydrocortisone cream 1 % [id number] No Apply 1 Application topically 2 (two) times daily. Masters, Florentina Addison, DO Past Month Active Spouse/Significant Other, Pharmacy Records  levETIRAcetam (KEPPRA) 750 MG tablet [id number]  Take 1 tablet (750 mg total) by mouth 2 (two) times daily. Monna Fam, MD  Active   Discontinued 04/02/20 1650 (Completed Course)   Multiple Vitamin (MULTIVITAMIN WITH MINERALS) TABS tablet [id number] No Take 1 tablet by mouth daily.  Patient not taking: Reported on 01/24/2023   Gwenevere Abbot, MD Not Taking Active Spouse/Significant Other, Pharmacy Records  Nystatin (GERHARDT'S BUTT CREAM) CREA [id number] No Apply 1 Application topically 4 (four) times daily -  before meals and at bedtime.  Patient not taking: Reported on 01/24/2023   Gwenevere Abbot, MD Not Taking Active Spouse/Significant Other, Pharmacy Records  pantoprazole (PROTONIX) 40 MG tablet [id number] No Take 1 tablet (40 mg total) by mouth daily. Nooruddin, Jason Fila, MD Past Week Active Spouse/Significant Other, Pharmacy Records  thiamine (VITAMIN B1) 100 MG tablet [id number] No Take 1 tablet (100 mg total) by mouth daily. Chauncey Mann, DO Past Week Active Spouse/Significant Other, Pharmacy Records  Zinc Oxide (TRIPLE PASTE) 12.8 % ointment [id number] No Apply topically as needed for irritation. In your buttocks Gwenevere Abbot, MD Unk Active Spouse/Significant Other, Pharmacy Records            Home Care and Equipment/Supplies: Were Home Health Services Ordered?: NA Any new equipment or  medical supplies ordered?: NA  Functional Questionnaire: Do you need assistance with  bathing/showering or dressing?: No Do you need assistance with meal preparation?: No Do you need assistance with eating?: No Do you have difficulty maintaining continence: No Do you need assistance with getting out of bed/getting out of a chair/moving?: No Do you have difficulty managing or taking your medications?: No  Follow up appointments reviewed: PCP Follow-up appointment confirmed?: Yes Date of PCP follow-up appointment?: 02/08/23 Specialist Hospital Follow-up appointment confirmed?: NA Do you need transportation to your follow-up appointment?: No Do you understand care options if your condition(s) worsen?: Yes-patient verbalized understanding    SIGNATURE Karena Addison, LPN [facility] Nurse Health Advisor Direct Dial [phone]

## 2023-02-03 ENCOUNTER — Encounter: Payer: BLUE CROSS/BLUE SHIELD | Admitting: Student

## 2023-02-06 ENCOUNTER — Encounter (HOSPITAL_COMMUNITY): Payer: Self-pay

## 2023-02-06 ENCOUNTER — Other Ambulatory Visit: Payer: Self-pay

## 2023-02-06 ENCOUNTER — Emergency Department (HOSPITAL_COMMUNITY): Payer: BLUE CROSS/BLUE SHIELD

## 2023-02-06 ENCOUNTER — Emergency Department (HOSPITAL_COMMUNITY)
Admission: EM | Admit: 2023-02-06 | Discharge: 2023-02-06 | Disposition: A | Discharge status: Home / Self Care | Payer: BLUE CROSS/BLUE SHIELD | Attending: Student | Admitting: Student

## 2023-02-06 DIAGNOSIS — M130 Polyarthritis, unspecified: Secondary | ICD-10-CM | POA: Insufficient documentation

## 2023-02-06 DIAGNOSIS — N289 Disorder of kidney and ureter, unspecified: Secondary | ICD-10-CM

## 2023-02-06 DIAGNOSIS — R000 Tachycardia, unspecified: Secondary | ICD-10-CM | POA: Diagnosis not present

## 2023-02-06 DIAGNOSIS — M2550 Pain in unspecified joint: Secondary | ICD-10-CM

## 2023-02-06 LAB — CBC WITH DIFFERENTIAL/PLATELET
Abs Immature Granulocytes: 0.03 10*3/uL (ref 0.00–0.07)
Basophils Absolute: 0 10*3/uL (ref 0.0–0.1)
Basophils Relative: 0 %
Eosinophils Absolute: 0.1 10*3/uL (ref 0.0–0.5)
Eosinophils Relative: 2 %
HCT: 26.2 % — ABNORMAL LOW (ref 36.0–46.0)
Hemoglobin: 8.8 g/dL — ABNORMAL LOW (ref 12.0–15.0)
Immature Granulocytes: 0 %
Lymphocytes Relative: 31 %
Lymphs Abs: 2.1 10*3/uL (ref 0.7–4.0)
MCH: 32.1 pg (ref 26.0–34.0)
MCHC: 33.6 g/dL (ref 30.0–36.0)
MCV: 95.6 fL (ref 80.0–100.0)
Monocytes Absolute: 0.6 10*3/uL (ref 0.1–1.0)
Monocytes Relative: 10 %
Neutro Abs: 3.8 10*3/uL (ref 1.7–7.7)
Neutrophils Relative %: 57 %
Platelets: 240 10*3/uL (ref 150–400)
RBC: 2.74 MIL/uL — ABNORMAL LOW (ref 3.87–5.11)
RDW: 17.4 % — ABNORMAL HIGH (ref 11.5–15.5)
WBC: 6.7 10*3/uL (ref 4.0–10.5)
nRBC: 0 % (ref 0.0–0.2)

## 2023-02-06 LAB — BASIC METABOLIC PANEL
Anion gap: 14 (ref 5–15)
BUN: 5 mg/dL — ABNORMAL LOW (ref 6–20)
CO2: 19 mmol/L — ABNORMAL LOW (ref 22–32)
Calcium: 7.8 mg/dL — ABNORMAL LOW (ref 8.9–10.3)
Chloride: 107 mmol/L (ref 98–111)
Creatinine, Ser: 1.42 mg/dL — ABNORMAL HIGH (ref 0.44–1.00)
GFR, Estimated: 43 mL/min — ABNORMAL LOW (ref >60)
Glucose, Bld: 91 mg/dL (ref 70–99)
Potassium: 3.6 mmol/L (ref 3.5–5.1)
Sodium: 140 mmol/L (ref 135–145)

## 2023-02-06 LAB — URINALYSIS, ROUTINE W REFLEX MICROSCOPIC
Bacteria, UA: NONE SEEN
Bilirubin Urine: NEGATIVE
Glucose, UA: NEGATIVE mg/dL
Hgb urine dipstick: NEGATIVE
Ketones, ur: NEGATIVE mg/dL
Nitrite: NEGATIVE
Protein, ur: NEGATIVE mg/dL
Specific Gravity, Urine: 1.015 (ref 1.005–1.030)
pH: 5 (ref 5.0–8.0)

## 2023-02-06 LAB — CK: Total CK: 39 U/L (ref 38–234)

## 2023-02-06 MED ORDER — ACETAMINOPHEN 500 MG PO TABS
1000.0000 mg | ORAL_TABLET | Freq: Once | ORAL | Status: AC
  Administered 2023-02-06: 1000 mg via ORAL
  Filled 2023-02-06: qty 2

## 2023-02-06 MED ORDER — ETODOLAC 400 MG PO TABS: 400.0000 mg | ORAL_TABLET | Freq: Two times a day (BID) | ORAL | 0 refills | Status: DC

## 2023-02-06 NOTE — ED Provider Notes (Signed)
[place] EMERGENCY DEPARTMENT AT [facility] Provider Note   CSN: [id number] Arrival date & time: 02/06/23  1728     History  Chief Complaint  Patient presents with   Arm Pain        Leg Pain   Hand Pain    Christina Byrd is a 57 y.o. female.  57 year old female presents today for concern of polyarthralgia.  She states she has history of gout but this is not typical for her gout to have pain in multiple joints.  No other complaints.  Has good range of motion.  Has not taken anything prior to arrival.  Does appear anxious on exam.   The history is provided by the patient. No language interpreter was used.       Home Medications Prior to Admission medications   Medication Sig Start Date End Date Taking? Authorizing Provider  etodolac (LODINE) 400 MG tablet Take 1 tablet (400 mg total) by mouth 2 (two) times daily. 02/06/23  Yes Karie Mainland, Caasi Giglia, PA-C  allopurinol (ZYLOPRIM) 100 MG tablet Take 2 tablets (200 mg total) by mouth daily. 01/24/23   Monna Fam, MD  atorvastatin (LIPITOR) 20 MG tablet Take 20 mg by mouth daily.    [provider]  colchicine 0.6 MG tablet Take 1 tablet (0.6 mg total) by mouth daily. Patient not taking: Reported on 01/24/2023 01/12/23   Atway, Rayann N, DO  ergocalciferol (VITAMIN D2) 1.25 MG (50000 UT) capsule Take 1 capsule by mouth once a week. Every Wednesday starting 11/13 12/30/22   Gwenevere Abbot, MD  feeding supplement (ENSURE ENLIVE / ENSURE PLUS) LIQD Take 237 mLs by mouth 3 (three) times daily between meals. Patient not taking: Reported on 01/24/2023 12/30/22   Gwenevere Abbot, MD  folic acid (FOLVITE) 1 MG tablet Take 1 tablet (1 mg total) by mouth daily. 11/22/22   Rhetta Mura, MD  hydrocortisone cream 1 % Apply 1 Application topically 2 (two) times daily. 01/05/23   Masters, Katie, DO  levETIRAcetam (KEPPRA) 750 MG tablet Take 1 tablet (750 mg total) by mouth 2 (two) times daily. 01/24/23   Monna Fam, MD  Multiple  Vitamin (MULTIVITAMIN WITH MINERALS) TABS tablet Take 1 tablet by mouth daily. Patient not taking: Reported on 01/24/2023 12/31/22   Gwenevere Abbot, MD  Nystatin (GERHARDT'S BUTT CREAM) CREA Apply 1 Application topically 4 (four) times daily -  before meals and at bedtime. Patient not taking: Reported on 01/24/2023 12/30/22   Gwenevere Abbot, MD  pantoprazole (PROTONIX) 40 MG tablet Take 1 tablet (40 mg total) by mouth daily. 11/03/22   Nooruddin, Jason Fila, MD  thiamine (VITAMIN B1) 100 MG tablet Take 1 tablet (100 mg total) by mouth daily. 01/12/23   Atway, Derwood Kaplan, DO  Zinc Oxide (TRIPLE PASTE) 12.8 % ointment Apply topically as needed for irritation. In your buttocks 12/30/22   Gwenevere Abbot, MD  metFORMIN (GLUCOPHAGE) 500 MG tablet Take 0.5 tablets (250 mg total) by mouth daily with breakfast. 03/05/20 04/02/20  Anders Simmonds, PA-C      Allergies    Losartan potassium, Levaquin [levofloxacin in d5w], Ace inhibitors, Codeine, Cefepime, Chlorhexidine, and Vancomycin    Review of Systems   Review of Systems  Constitutional:  Negative for chills and fever.  Musculoskeletal:  Positive for arthralgias and myalgias. Negative for back pain, gait problem, joint swelling and neck pain.  All other systems reviewed and are negative.   Physical Exam Updated Vital Signs BP (!) 148/80 (BP Location: Right Leg)  Pulse (!) 118   Temp 99.2 F (37.3 C) (Oral)   Resp 17   Ht 5\' 2"  (1.575 m)   Wt 57.2 kg   LMP 08/23/2011   SpO2 100%   BMI 23.06 kg/m  Physical Exam Vitals and nursing note reviewed.  Constitutional:      General: She is not in acute distress.    Appearance: Normal appearance. She is not ill-appearing.  HENT:     Head: Normocephalic and atraumatic.     Nose: Nose normal.  Eyes:     Conjunctiva/sclera: Conjunctivae normal.  Cardiovascular:     Rate and Rhythm: Regular rhythm. Tachycardia present.  Pulmonary:     Effort: Pulmonary effort is normal. No respiratory distress.   Musculoskeletal:        General: No tenderness or deformity. Normal range of motion.     Comments: Good range of motion in all major joints.  Without tenderness to palpation.  Neurovascularly intact.  Skin:    Findings: No rash.  Neurological:     Mental Status: She is alert.     ED Results / Procedures / Treatments   Labs (all labs ordered are listed, but only abnormal results are displayed) Labs Reviewed  CBC WITH DIFFERENTIAL/PLATELET - Abnormal; Notable for the following components:      Result Value   RBC 2.74 (*)    Hemoglobin 8.8 (*)    HCT 26.2 (*)    RDW 17.4 (*)    All other components within normal limits  BASIC METABOLIC PANEL - Abnormal; Notable for the following components:   CO2 19 (*)    BUN 5 (*)    Creatinine, Ser 1.42 (*)    Calcium 7.8 (*)    GFR, Estimated 43 (*)    All other components within normal limits  URINALYSIS, ROUTINE W REFLEX MICROSCOPIC - Abnormal; Notable for the following components:   APPearance HAZY (*)    Leukocytes,Ua TRACE (*)    All other components within normal limits  CK    EKG None  Radiology DG Shoulder Left Result Date: 02/06/2023 CLINICAL DATA:  Concern for fracture.  Arm and hand pain bilaterally EXAM: LEFT SHOULDER - 2+ VIEW COMPARISON:  11/02/2022 FINDINGS: No acute fracture or dislocation. Moderate degenerative arthritis AC and glenohumeral joints. IMPRESSION: No acute fracture or dislocation. Electronically Signed   By: Minerva Fester M.D.   On: 02/06/2023 21:23   DG Knee Complete 4 Views Right Result Date: 02/06/2023 CLINICAL DATA:  Right knee pain EXAM: RIGHT KNEE - COMPLETE 4+ VIEW COMPARISON:  None Available. FINDINGS: Normal alignment. No acute fracture or dislocation. Bipartite patella. Mild medial compartment degenerative arthritis with small osteophyte formation. No effusion. Soft tissues are unremarkable. IMPRESSION: 1. Mild medial compartment degenerative arthritis. Electronically Signed   By: Helyn Numbers M.D.   On: 02/06/2023 20:24    Procedures Procedures    Medications Ordered in ED Medications  acetaminophen (TYLENOL) tablet 1,000 mg (1,000 mg Oral Given 02/06/23 1950)    ED Course/ Medical Decision Making/ A&P                                 Medical Decision Making Amount and/or Complexity of Data Reviewed Labs: ordered. Radiology: ordered.  Risk OTC drugs. Prescription drug management.   57 year old female presents today for concern of polyarthralgias.  Has been range of motion.  Exam overall reassuring.  Neurovascularly intact.  Basic blood  work reassuring.  Without leukocytosis.  Anemia at baseline.  Has renal insufficiency but not AKI.  Patient endorses significant soft drinks and caffeine coffee consumption.  Discussed stopping these and drinking more fluids.  She is agreeable.  She will follow-up with PCP in the next few days to have repeat labs.  Return precaution discussed.  This is likely contributing to her tachycardia.  Patient stable for discharge.  Discharged in stable condition.  X-rays negative for acute concern.   Final Clinical Impression(s) / ED Diagnoses Final diagnoses:  Polyarthralgia    Rx / DC Orders ED Discharge Orders          Ordered    etodolac (LODINE) 400 MG tablet  2 times daily        02/06/23 2209              Marita Kansas, PA-C 02/06/23 2251    Glendora Score, MD 02/07/23 1306

## 2023-02-06 NOTE — ED Notes (Signed)
Patient ambulatory to bathroom with husband

## 2023-02-06 NOTE — ED Notes (Signed)
Patient transported to X-ray 

## 2023-02-06 NOTE — Discharge Instructions (Addendum)
Your blood work, x-rays were normal.  I have sent anti-inflammatory medication to your pharmacy.  Follow-up with PCP.  Return for any concerning symptoms. Follow-up with PCP to have your blood work repeated in the next several days.  Increase your hydration.

## 2023-02-06 NOTE — ED Triage Notes (Signed)
Pt c/o arms and hand bilat pain, right leg painx2d.

## 2023-02-06 NOTE — ED Notes (Addendum)
Pt c/o pain in bilateral hands and feet after playing/throwing a ball with her dogs.  Pt reports she was chasing them around as well.  Pt reports taking DICLOFENAC w/o relief.

## 2023-02-08 ENCOUNTER — Ambulatory Visit: Payer: BLUE CROSS/BLUE SHIELD | Admitting: Internal Medicine

## 2023-02-08 ENCOUNTER — Encounter: Payer: Self-pay | Admitting: Internal Medicine

## 2023-02-08 ENCOUNTER — Other Ambulatory Visit: Payer: Self-pay

## 2023-02-08 VITALS — BP 130/85 | HR 111 | Temp 98.7°F | Ht 62.0 in | Wt 113.6 lb

## 2023-02-08 DIAGNOSIS — Z23 Encounter for immunization: Secondary | ICD-10-CM | POA: Diagnosis not present

## 2023-02-08 DIAGNOSIS — K31811 Angiodysplasia of stomach and duodenum with bleeding: Secondary | ICD-10-CM

## 2023-02-08 DIAGNOSIS — G40919 Epilepsy, unspecified, intractable, without status epilepticus: Secondary | ICD-10-CM

## 2023-02-08 DIAGNOSIS — F1010 Alcohol abuse, uncomplicated: Secondary | ICD-10-CM

## 2023-02-08 DIAGNOSIS — G8929 Other chronic pain: Secondary | ICD-10-CM

## 2023-02-08 DIAGNOSIS — M25512 Pain in left shoulder: Secondary | ICD-10-CM | POA: Diagnosis not present

## 2023-02-08 MED ORDER — THIAMINE HCL 100 MG PO TABS: 100.0000 mg | ORAL_TABLET | Freq: Every day | ORAL | 0 refills | Status: DC

## 2023-02-08 NOTE — Assessment & Plan Note (Addendum)
Patient was previously referred to GI for chronic anemia, episodes of dark stools, known AVM, and dysphagia. She has never scheduled with GI. Information to schedule included in after visit summary and recommendation to schedule appointment ASAP. Patient refused labs today. I also recommended against NSAIDs given history of GI bleeding, but patient is convinced diclofenac is the only thing that helps with her chronic pain.

## 2023-02-08 NOTE — Assessment & Plan Note (Signed)
Patient reports drinking about two shots of liquor per night to help with chronic pain and sleep. She is not ready to quit drinking at this time.

## 2023-02-08 NOTE — Patient Instructions (Addendum)
Christina Byrd,   I'm glad you've been feeling better. Here is the information for the GI doctor:   Devona Konig, MD Doctor in [facility], [facility] Address: [address], [place], [place] [address] Phone: [phone]  And for physical therapy:   Ophelia Charter, DPT Physical Therapy [facility] - [place] [address] [place], [place] [address] [phone]  Please make an appointment with both of these clinics.   For your shoulder pain, try the medication that the emergency room doctors gave you with food. Also try the Voltaren gel and lidocaine patches.   We will plan to see you again in about 3 months.   Thanks,  Dr Carlynn Purl

## 2023-02-08 NOTE — Assessment & Plan Note (Addendum)
Was seen in the ED two days ago for her chronic left shoulder and right knee pain. Plain film imaging of both locations showed mild osteoarthritis of right knee, no acute abnormality of left shoulder. She was prescribed Lodine in the ED, which she took on an empty stomach and said it didn't make her feel good. I recommended against regular NSAID use given history of CKD and AVM, however patient states regular diclofenac use is the only thing that helps with chronic pain. Referral has previously been sent in for PT, which patient never scheduled.  - Information included in AVS to schedule with PT - Recommend trying Lodine with food, Voltaren gel and lidocaine patches

## 2023-02-08 NOTE — Assessment & Plan Note (Signed)
Patient presents for HFU after admission for breakthrough seizure related to medication nonadherence and possible alcohol withdrawal. Patient has been adherent to Keppra 750mg  BID since discharge and has had no further seizure activity. No changes to management today.

## 2023-02-08 NOTE — Progress Notes (Signed)
Subjective:  CC: HFU, joint pain  HPI:  Christina Byrd is a 57 y.o. female with a past medical history stated below and presents today for above. Please see problem based assessment and plan for additional details.  Past Medical History:  Diagnosis Date   GASTROESOPHAGEAL REFLUX, NO ESOPHAGITIS 04/21/2006   Qualifier: Diagnosis of  By: Levada Schilling     GERD (gastroesophageal reflux disease) Dx 1995   Hot flash, menopausal 06/22/2016   Hot flashes 01/30/2020   HTN (hypertension)    Hypotension 10/21/2022   Patient's blood pressure at presentation was 87/65, follow up blood pressure was 81/58. Patient reports regurgitating everything she has taken po for the past month. Denies lightheadedness or dizziness. Discussed GI referral, though patient stated she would be going to the emergency room today for IV fluids.   -GI referral for upper endoscopy placed  -Patient planning on going to the ED for IV flu   Intertrigo 06/22/2016   Nausea & vomiting 12/23/2022   Nausea and vomiting 12/24/2022   Pain and swelling of left ankle 12/02/2014   Seizures (HCC)    Tendinitis of right hip flexor 07/23/2014   Tendonitis, Achilles, right 04/01/2014    Current Outpatient Medications on File Prior to Visit  Medication Sig Dispense Refill   allopurinol (ZYLOPRIM) 100 MG tablet Take 2 tablets (200 mg total) by mouth daily. 60 tablet 3   atorvastatin (LIPITOR) 20 MG tablet Take 20 mg by mouth daily.     colchicine 0.6 MG tablet Take 1 tablet (0.6 mg total) by mouth daily. (Patient not taking: Reported on 01/24/2023) 30 tablet 3   ergocalciferol (VITAMIN D2) 1.25 MG (50000 UT) capsule Take 1 capsule by mouth once a week. Every Wednesday starting 11/13 7 capsule 0   etodolac (LODINE) 400 MG tablet Take 1 tablet (400 mg total) by mouth 2 (two) times daily. 20 tablet 0   feeding supplement (ENSURE ENLIVE / ENSURE PLUS) LIQD Take 237 mLs by mouth 3 (three) times daily between meals. (Patient not  taking: Reported on 01/24/2023) 237 mL 12   folic acid (FOLVITE) 1 MG tablet Take 1 tablet (1 mg total) by mouth daily. 30 tablet 2   hydrocortisone cream 1 % Apply 1 Application topically 2 (two) times daily. 30 g 0   levETIRAcetam (KEPPRA) 750 MG tablet Take 1 tablet (750 mg total) by mouth 2 (two) times daily. 60 tablet 2   Multiple Vitamin (MULTIVITAMIN WITH MINERALS) TABS tablet Take 1 tablet by mouth daily. (Patient not taking: Reported on 01/24/2023) 30 tablet 0   Nystatin (GERHARDT'S BUTT CREAM) CREA Apply 1 Application topically 4 (four) times daily -  before meals and at bedtime. (Patient not taking: Reported on 01/24/2023) 1 each 0   pantoprazole (PROTONIX) 40 MG tablet Take 1 tablet (40 mg total) by mouth daily. 90 tablet 3   Zinc Oxide (TRIPLE PASTE) 12.8 % ointment Apply topically as needed for irritation. In your buttocks 56.7 g 0   [DISCONTINUED] metFORMIN (GLUCOPHAGE) 500 MG tablet Take 0.5 tablets (250 mg total) by mouth daily with breakfast. 45 tablet 6   No current facility-administered medications on file prior to visit.    Review of Systems: ROS negative except for as is noted on the assessment and plan.  Objective:   Vitals:   02/08/23 0856 02/08/23 0902  BP: (!) 126/93 130/85  Pulse: (!) 109 (!) 111  Temp: 98.7 F (37.1 C)   SpO2: 100%   Weight: 113 lb  9.6 oz (51.5 kg)   Height: 5\' 2"  (1.575 m)     Physical Exam: Constitutional: in no acute distress HENT: normocephalic atraumatic, mucous membranes moist Cardiovascular: regular rate and rhythm, no m/r/g Pulmonary/Chest: normal work of breathing on room air, lungs clear to auscultation bilaterally Abdominal: soft, non-tender, non-distended MSK: normal bulk and tone Neurological: alert & oriented x 3, 5/5 strength in bilateral upper and lower extremities Skin: warm and dry  Assessment & Plan:   Breakthrough seizure [facility]) Patient presents for HFU after admission for breakthrough seizure related to medication  nonadherence and possible alcohol withdrawal. Patient has been adherent to Keppra 750mg  BID since discharge and has had no further seizure activity. No changes to management today.   Alcohol abuse Patient reports drinking about two shots of liquor per night to help with chronic pain and sleep. She is not ready to quit drinking at this time.   Left shoulder pain Was seen in the ED two days ago for her chronic left shoulder and right knee pain. Plain film imaging of both locations showed mild osteoarthritis of right knee, no acute abnormality of left shoulder. She was prescribed Lodine in the ED, which she took on an empty stomach and said it didn't make her feel good. I recommended against regular NSAID use given history of CKD and AVM, however patient states regular diclofenac use is the only thing that helps with chronic pain. Referral has previously been sent in for PT, which patient never scheduled.  - Information included in AVS to schedule with PT - Recommend trying Lodine with food, Voltaren gel and lidocaine patches  AVM (arteriovenous malformation) of stomach, acquired with hemorrhage Patient was previously referred to GI for chronic anemia, episodes of dark stools, known AVM, and dysphagia. She has never scheduled with GI. Information to schedule included in after visit summary and recommendation to schedule appointment ASAP. Patient refused labs today. I also recommended against NSAIDs given history of GI bleeding, but patient is convinced diclofenac is the only thing that helps with her chronic pain.     Patient seen with Dr. Cleda Daub  Monna Fam MD [facility] Internal Medicine  PGY-1 Pager: [phone] Date 02/08/2023  Time 11:17 AM

## 2023-02-14 NOTE — Progress Notes (Signed)
Internal Medicine Clinic Attending  I was physically present during the key portions of the resident provided service and participated in the medical decision making of patient's management care. I reviewed pertinent patient test results.  The assessment, diagnosis, and plan were formulated together and I agree with the documentation in the resident's note.  Gust Rung, DO

## 2023-03-14 ENCOUNTER — Encounter: Payer: BLUE CROSS/BLUE SHIELD | Admitting: Student

## 2023-03-16 ENCOUNTER — Encounter: Payer: BLUE CROSS/BLUE SHIELD | Admitting: Student

## 2023-03-16 NOTE — Progress Notes (Deleted)
   Established Patient Office Visit  Subjective   Patient ID: Christina Byrd, female    DOB: 06-Mar-1965  Age: 58 y.o. MRN: [id number]  No chief complaint on file.   HPI  {History (Optional):23778}  ROS    Objective:     LMP 08/23/2011  {Vitals History (Optional):23777}  Physical Exam   No results found for any visits on 03/16/23.  {Labs (Optional):23779}  The 10-year ASCVD risk score (Arnett DK, et al., 2019) is: 5.3%    Assessment & Plan:   Problem List Items Addressed This Visit   None   No follow-ups on file.    Faith Rogue, DO

## 2023-03-23 ENCOUNTER — Other Ambulatory Visit: Payer: Self-pay

## 2023-03-24 MED ORDER — LEVETIRACETAM 750 MG PO TABS: 750.0000 mg | ORAL_TABLET | Freq: Two times a day (BID) | ORAL | 2 refills | Status: DC

## 2023-03-24 MED ORDER — ETODOLAC 400 MG PO TABS: 400.0000 mg | ORAL_TABLET | Freq: Two times a day (BID) | ORAL | 0 refills | Status: DC

## 2023-03-24 MED ORDER — FOLIC ACID 1 MG PO TABS: 1.0000 mg | ORAL_TABLET | Freq: Every day | ORAL | 2 refills | Status: DC

## 2023-03-24 MED ORDER — PANTOPRAZOLE SODIUM 40 MG PO TBEC: 40.0000 mg | DELAYED_RELEASE_TABLET | Freq: Every day | ORAL | 3 refills | Status: DC

## 2023-03-24 MED ORDER — ERGOCALCIFEROL 1.25 MG (50000 UT) PO CAPS: 50000.0000 [IU] | ORAL_CAPSULE | ORAL | 0 refills | Status: DC

## 2023-03-31 ENCOUNTER — Ambulatory Visit (INDEPENDENT_AMBULATORY_CARE_PROVIDER_SITE_OTHER): Payer: BLUE CROSS/BLUE SHIELD | Admitting: Student

## 2023-03-31 VITALS — BP 116/77 | HR 108 | Temp 99.3°F | Ht 62.0 in | Wt 109.3 lb

## 2023-03-31 DIAGNOSIS — E039 Hypothyroidism, unspecified: Secondary | ICD-10-CM

## 2023-03-31 DIAGNOSIS — M1712 Unilateral primary osteoarthritis, left knee: Secondary | ICD-10-CM

## 2023-03-31 DIAGNOSIS — K31811 Angiodysplasia of stomach and duodenum with bleeding: Secondary | ICD-10-CM | POA: Diagnosis not present

## 2023-03-31 DIAGNOSIS — M1990 Unspecified osteoarthritis, unspecified site: Secondary | ICD-10-CM

## 2023-03-31 NOTE — Progress Notes (Signed)
 CC: Follow-up of chronic conditions  HPI: [name] is a 58 y.o. female living with a history stated below and presents today for follow-up of chronic conditions. Please see problem based assessment and plan for additional details.  Past Medical History:  Diagnosis Date   GASTROESOPHAGEAL REFLUX, NO ESOPHAGITIS 04/21/2006   Qualifier: Diagnosis of  By: WATT, JOANNE     GERD (gastroesophageal reflux disease) Dx 1995   Hot flash, menopausal 06/22/2016   Hot flashes 01/30/2020   HTN (hypertension)    Hypotension 10/21/2022   Patient's blood pressure at presentation was 87/65, follow up blood pressure was 81/58. Patient reports regurgitating everything she has taken po for the past month. Denies lightheadedness or dizziness. Discussed GI referral, though patient stated she would be going to the emergency room today for IV fluids.   -GI referral for upper endoscopy placed  -Patient planning on going to the ED for IV flu   Intertrigo 06/22/2016   Nausea & vomiting 12/23/2022   Nausea and vomiting 12/24/2022   Pain and swelling of left ankle 12/02/2014   Seizures (HCC)    Tendinitis of right hip flexor 07/23/2014   Tendonitis, Achilles, right 04/01/2014    Current Outpatient Medications on File Prior to Visit  Medication Sig Dispense Refill   allopurinol  (ZYLOPRIM ) 100 MG tablet Take 2 tablets (200 mg total) by mouth daily. 60 tablet 3   atorvastatin  (LIPITOR) 20 MG tablet Take 20 mg by mouth daily.     colchicine  0.6 MG tablet Take 1 tablet (0.6 mg total) by mouth daily. (Patient not taking: Reported on 01/24/2023) 30 tablet 3   ergocalciferol  (VITAMIN D2) 1.25 MG (50000 UT) capsule Take 1 capsule by mouth once a week. Every Wednesday starting 11/13 7 capsule 0   etodolac  (LODINE ) 400 MG tablet Take 1 tablet (400 mg total) by mouth 2 (two) times daily. 20 tablet 0   feeding supplement (ENSURE ENLIVE / ENSURE PLUS) LIQD Take 237 mLs by mouth 3 (three) times daily between meals.  (Patient not taking: Reported on 01/24/2023) 237 mL 12   folic acid  (FOLVITE ) 1 MG tablet Take 1 tablet (1 mg total) by mouth daily. 30 tablet 2   hydrocortisone  cream 1 % Apply 1 Application topically 2 (two) times daily. 30 g 0   levETIRAcetam  (KEPPRA ) 750 MG tablet Take 1 tablet (750 mg total) by mouth 2 (two) times daily. 60 tablet 2   Multiple Vitamin (MULTIVITAMIN WITH MINERALS) TABS tablet Take 1 tablet by mouth daily. (Patient not taking: Reported on 01/24/2023) 30 tablet 0   Nystatin (GERHARDT'S BUTT CREAM) CREA Apply 1 Application topically 4 (four) times daily -  before meals and at bedtime. (Patient not taking: Reported on 01/24/2023) 1 each 0   pantoprazole  (PROTONIX ) 40 MG tablet Take 1 tablet (40 mg total) by mouth daily. 90 tablet 3   thiamine  (VITAMIN B1) 100 MG tablet Take 1 tablet (100 mg total) by mouth daily. 30 tablet 0   Zinc  Oxide (TRIPLE PASTE) 12.8 % ointment Apply topically as needed for irritation. In your buttocks 56.7 g 0   [DISCONTINUED] metFORMIN  (GLUCOPHAGE ) 500 MG tablet Take 0.5 tablets (250 mg total) by mouth daily with breakfast. 45 tablet 6   No current facility-administered medications on file prior to visit.    Family History  Problem Relation Age of Onset   CAD Mother     Social History   Socioeconomic History   Marital status: Single    Spouse name: Not  on file   Number of children: Not on file   Years of education: Not on file   Highest education level: Not on file  Occupational History   Not on file  Tobacco Use   Smoking status: Former    Current packs/day: 0.00    Types: Cigarettes    Quit date: 06/25/2012    Years since quitting: 10.7   Smokeless tobacco: Never  Vaping Use   Vaping status: Never Used  Substance and Sexual Activity   Alcohol use: Not Currently    Alcohol/week: 49.0 standard drinks of alcohol    Types: 14 Cans of beer, 35 Shots of liquor per week   Drug use: No   Sexual activity: Not Currently  Other Topics Concern    Not on file  Social History Narrative   Pt lives in single story home with her partner   Has 1 son   5 grandchildren   10th grade education   Works at [employer].    Right handed   Social Drivers of Health   Financial Resource Strain: Not on file  Food Insecurity: No Food Insecurity (01/24/2023)   Hunger Vital Sign    Worried About Running Out of Food in the Last Year: Never true    Ran Out of Food in the Last Year: Never true  Transportation Needs: No Transportation Needs (01/24/2023)   PRAPARE - Administrator, Civil Service (Medical): No    Lack of Transportation (Non-Medical): No  Physical Activity: Not on file  Stress: Not on file  Social Connections: Not on file  Intimate Partner Violence: Not At Risk (01/24/2023)   Humiliation, Afraid, Rape, and Kick questionnaire    Fear of Current or Ex-Partner: No    Emotionally Abused: No    Physically Abused: No    Sexually Abused: No    Review of Systems: ROS negative except for what is noted on the assessment and plan.  Vitals:   03/31/23 1321  BP: 116/77  Pulse: (!) 108  Temp: 99.3 F (37.4 C)  TempSrc: Oral  SpO2: 99%  Weight: 109 lb 4.8 oz (49.6 kg)  Height: 5' 2 (1.575 m)    Physical Exam: Constitutional: well-appearing in no acute distress HENT: normocephalic atraumatic, mucous membranes moist Eyes: conjunctiva non-erythematous Neck: supple Cardiovascular: regular rate and rhythm, no m/r/g Pulmonary/Chest: normal work of breathing on room air, lungs clear to auscultation bilaterally Abdominal: soft, non-tender, non-distended MSK: normal bulk and tone Neurological: alert & oriented x 3, 5/5 strength in bilateral upper and lower extremities, normal gait Skin: warm and dry  Assessment & Plan:   Osteoarthritis of left knee Patient noted to have had history of osteoarthritis of left shoulder and right knee.  She is currently taking Tylenol  with some alleviation.  She is also taking lidocaine   patches and Voltaren  gel which also provide some alleviation.  She is interested in physical therapy for continued treatment. - PT referral  AVM (arteriovenous malformation) of stomach, acquired with hemorrhage Patient has history notable for chronic anemia, dark stools, dysphagia, and an AVM.  She notes having black stools roughly 1 week ago that occurred for 1 day but these have since resolved.  She also denies any lightheadedness, weakness, or any other signs or symptoms of bleeding.  Patient was offered labs today to further evaluate her chronic anemia and other abnormal labs previously found at previous visits.  She was initially amenable but later refused collection.  She is scheduled for further  workup with gastroenterology in March. - Followed GI in March  Hypothyroidism She was noted to have had an elevated TSH at previous visits.  However she was asymptomatic and this was thought to have been subclinical hypothyroidism.  Today, she is endorsing cold intolerance.  Further workup with a TSH and free T4 were offered to further elucidate a cause.  Patient refused labs after initial agreement.  Would reengage at future visits particularly if signs and symptoms persist.  Patient discussed with Dr. Jerrell Signe Arabian, MD  [facility] Internal Medicine, PGY-1 Date 04/01/2023 Time 4:49 PM

## 2023-03-31 NOTE — Patient Instructions (Addendum)
 Thank you so much for coming to the clinic today!   - Continue taking your medications as prescribed - I will follow up regarding your labs  - Follow-up with your GI appointment on 05/18/23 [phone])  If you have any questions please feel free to the call the clinic at anytime at [phone]. It was a pleasure seeing you!  Best, Dr. Stephanie

## 2023-04-01 NOTE — Assessment & Plan Note (Addendum)
 Patient noted to have had history of osteoarthritis of left shoulder and right knee.  She is currently taking Tylenol  with some alleviation.  She is also taking lidocaine  patches and Voltaren  gel which also provide some alleviation.  She is interested in physical therapy for continued treatment. - PT referral

## 2023-04-01 NOTE — Assessment & Plan Note (Addendum)
 Patient has history notable for chronic anemia, dark stools, dysphagia, and an AVM.  She notes having black stools roughly 1 week ago that occurred for 1 day but these have since resolved.  She also denies any lightheadedness, weakness, or any other signs or symptoms of bleeding.  Patient was offered labs today to further evaluate her chronic anemia and other abnormal labs previously found at previous visits.  She was initially amenable but later refused collection.  She is scheduled for further workup with gastroenterology in March. - Followed GI in March

## 2023-04-01 NOTE — Assessment & Plan Note (Addendum)
 She was noted to have had an elevated TSH at previous visits.  However she was asymptomatic and this was thought to have been subclinical hypothyroidism.  Today, she is endorsing cold intolerance.  Further workup with a TSH and free T4 were offered to further elucidate a cause.  Patient refused labs after initial agreement.  Would reengage at future visits particularly if signs and symptoms persist.

## 2023-04-04 NOTE — Addendum Note (Signed)
 Addended by: Juel Nutley T on: 04/04/2023 01:50 PM   Modules accepted: Level of Service

## 2023-04-04 NOTE — Progress Notes (Signed)
 Internal Medicine Clinic Attending  Case discussed with the resident at the time of the visit.  We reviewed the resident's history and exam and pertinent patient test results.  I agree with the assessment, diagnosis, and plan of care documented in the resident's note.

## 2023-04-28 ENCOUNTER — Other Ambulatory Visit: Payer: Self-pay | Admitting: Student

## 2023-04-28 ENCOUNTER — Ambulatory Visit: Payer: Self-pay | Admitting: Student

## 2023-04-28 NOTE — Telephone Encounter (Signed)
 Copied from CRM [id number]. Topic: Clinical - Medication Refill >> Apr 28, 2023  1:54 PM Brandy W wrote: Most Recent Primary Care Visit:  Provider: Morrie Sheldon  Department: [facility]  Visit Type: OPEN ESTABLISHED  Date: 03/31/2023  Medication: levETIRAcetam (KEPPRA) 750 MG tablet   folic acid (FOLVITE) 1 MG tablet etodolac (LODINE) 400 MG tablet  allopurinol (ZYLOPRIM) 100 MG tablet atorvastatin (LIPITOR) 20 MG tablet colchicine 0.6 MG tablet ergocalciferol (VITAMIN D2) 1.25 MG (50000 UT) capsule  etodolac (LODINE) 400 MG tablet hydrocortisone cream 1 % thiamine (VITAMIN B1) 100 MG tablet   pantoprazole (PROTONIX) 40 MG tablet Zinc Oxide (TRIPLE PASTE) 12.8 % ointment   Has the patient contacted their pharmacy? Yes (Agent: If no, request that the patient contact the pharmacy for the refill. If patient does not wish to contact the pharmacy document the reason why and proceed with request.) (Agent: If yes, when and what did the pharmacy advise?)  Is this the correct pharmacy for this prescription? Yes If no, delete pharmacy and type the correct one.  This is the patient's preferred pharmacy:  [id number] - [place], [place] - [address] AT [facility] [address] [place] [address] Phone: [phone] Fax: [phone]    Has the prescription been filled recently? No  Is the patient out of the medication? Yes  Has the patient been seen for an appointment in the last year OR does the patient have an upcoming appointment? Yes  Can we respond through MyChart? Yes  Agent: Please be advised that Rx refills may take up to 3 business days. We ask that you follow-up with your pharmacy.

## 2023-04-28 NOTE — Telephone Encounter (Signed)
 Red Word that prompted transfer to Nurse Triage: Patient called in for a medication refill and then reported bilateral hand numbness, with a Hx heart fluttering, dizziness, and difficulty maintaining upright posture. Right now she just has bilateral hand numbness, she does have a Hx of seizures  Sent to nurse triage for further evaluation.     Chief Complaint: Numbness to hands that comes and goes. Has appointment 05/02/23. Symptoms: Above Frequency: 2 months Pertinent Negatives: Patient denies  Disposition: [] ED /[] Urgent Care (no appt availability in office) / [x] Appointment(In office/virtual)/ []  [facility]/ [] Home Care/ [] Refused Recommended Disposition /[] [place] [facility]/ []  Follow-up with PCP Additional Notes: Agrees with appointment.  Reason for Disposition  [1] Numbness or tingling in one or both hands AND [2] is a chronic symptom (recurrent or ongoing AND present > 4 weeks)  Answer Assessment - Initial Assessment Questions 1. SYMPTOM: "What is the main symptom you are concerned about?" (e.g., weakness, numbness)     Both hands numb 2. ONSET: "When did this start?" (minutes, hours, days; while sleeping)     1 week, been out of my pills 3. LAST NORMAL: "When was the last time you (the patient) were normal (no symptoms)?"     Months  4. PATTERN "Does this come and go, or has it been constant since it started?"  "Is it present now?"     Comes and goes 5. CARDIAC SYMPTOMS: "Have you had any of the following symptoms: chest pain, difficulty breathing, palpitations?"     No 6. NEUROLOGIC SYMPTOMS: "Have you had any of the following symptoms: headache, dizziness, vision loss, double vision, changes in speech, unsteady on your feet?"     Dizziness sometimes 7. OTHER SYMPTOMS: "Do you have any other symptoms?"     Left elbow is stiff 8. PREGNANCY: "Is there any chance you are pregnant?" "When was your last menstrual period?"     No  Protocols used: Neurologic  Deficit-A-AH

## 2023-04-29 MED ORDER — ERGOCALCIFEROL 1.25 MG (50000 UT) PO CAPS: 50000.0000 [IU] | ORAL_CAPSULE | ORAL | 0 refills | Status: DC

## 2023-04-29 MED ORDER — ETODOLAC 400 MG PO TABS: 400.0000 mg | ORAL_TABLET | Freq: Two times a day (BID) | ORAL | 0 refills | Status: DC

## 2023-04-29 MED ORDER — ATORVASTATIN CALCIUM 20 MG PO TABS: 20.0000 mg | ORAL_TABLET | Freq: Every day | ORAL | 3 refills | Status: DC

## 2023-04-29 MED ORDER — FOLIC ACID 1 MG PO TABS: 1.0000 mg | ORAL_TABLET | Freq: Every day | ORAL | 2 refills | Status: DC

## 2023-04-29 MED ORDER — LEVETIRACETAM 750 MG PO TABS: 750.0000 mg | ORAL_TABLET | Freq: Two times a day (BID) | ORAL | 2 refills | Status: DC

## 2023-04-29 MED ORDER — THIAMINE HCL 100 MG PO TABS: 100.0000 mg | ORAL_TABLET | Freq: Every day | ORAL | 0 refills | Status: DC

## 2023-04-29 MED ORDER — ALLOPURINOL 100 MG PO TABS: 200.0000 mg | ORAL_TABLET | Freq: Every day | ORAL | 3 refills | Status: DC

## 2023-04-29 MED ORDER — PANTOPRAZOLE SODIUM 40 MG PO TBEC: 40.0000 mg | DELAYED_RELEASE_TABLET | Freq: Every day | ORAL | 3 refills | Status: DC

## 2023-05-02 ENCOUNTER — Ambulatory Visit: Payer: Self-pay | Admitting: Student

## 2023-05-02 ENCOUNTER — Encounter: Payer: BLUE CROSS/BLUE SHIELD | Admitting: Student

## 2023-05-02 NOTE — Telephone Encounter (Signed)
 Call to patient.  Having pain in hands and numbness with joint pain.  Pain states is having some problems keeping food down.  Offered an appointment for tomorrow morning in the Clinics at 9:45 AM.  Took appointment and said that she will go home and take some Pepto Bismol and eat something until her appointment on tomorrow.

## 2023-05-02 NOTE — Telephone Encounter (Signed)
 Copied from CRM [id number]. Topic: Clinical - Red Word Triage >> May 02, 2023  1:32 PM Tiffany H wrote: Patient called to advise that she's experiencing hand numbness, hand pain and joint pain. Patient advised that she's having a hard time keeping food down. Patient would like an acute care visit but first available isn't until 05/05/23.   Patient advised that she often shakes and cannot hold things. Please assist.    Chief Complaint: Numbness Symptoms: Numbness in hands and feet , dizziness,  Frequency: Ongoing for months Disposition: [x] ED /[] Urgent Care (no appt availability in office) / [] Appointment(In office/virtual)/ []  [facility]/ [] Home Care/ [] Refused Recommended Disposition /[] [facility]/ []  Follow-up with PCP Additional Notes: Patient stated that she has been having numbness in her hands and feet for months. She also mentioned that she is having dizziness that started a few days ago. The dizziness is intermittent and she also feels weak. Patient mentioned that she has vomiting 5 minutes after she eats or drinks anything. She is unable to keep anything down and she stated this has been going on for a while, but she was unable to provide a specific time frame. Advised patient that she should be seen in ED. Patient was reluctant but she verbalized understanding and stated her partner can take her.   Answer Assessment - Initial Assessment Questions 1. VOMITING SEVERITY: "How many times have you vomited in the past 24 hours?"     - MILD:  1 - 2 times/day    - MODERATE: 3 - 5 times/day, decreased oral intake without significant weight loss or symptoms of dehydration    - SEVERE: 6 or more times/day, vomits everything or nearly everything, with significant weight loss, symptoms of dehydration      Patient did not give a number. She stated she vomits 5 minutes after eating or drinking anything  2. ONSET: "When did the vomiting begin?"      Has been going on for a  while. Patient unable to give specific time frame  3. OTHER SYMPTOMS: "Do you have any other symptoms?" (e.g., fever, headache, vertigo, vomiting blood or coffee grounds, recent head injury)     Dizziness  Answer Assessment - Initial Assessment Questions 1. SYMPTOM: "What is the main symptom you are concerned about?" (e.g., weakness, numbness)     Finger numbness both hands and legs. One finger is stiff. Patient stated one doctor said she has arthritis and another doctor said she has said gout  2. ONSET: "When did this start?" (minutes, hours, days; while sleeping)     2 months ago  3. PATTERN "Does this come and go, or has it been constant since it started?"  "Is it present now?"     Leg numbness comes and goes. Feels finger numbness now  4. CARDIAC SYMPTOMS: "Have you had any of the following symptoms: chest pain, difficulty breathing, palpitations?"     No  5. NEUROLOGIC SYMPTOMS: "Have you had any of the following symptoms: headache, dizziness, vision loss, double vision, changes in speech, unsteady on your feet?"     Dizziness that started a few days ago. Patient feels like it is from seizure med. Patient has intermittent dizziness at times and feels like about to fall. Patient's legs and hands are shaking. Patient is weak.  6. OTHER SYMPTOMS: "Do you have any other symptoms?"     Vomiting, earlier this morning. Patient stated vomiting has been going on for a while. She vomits 5 min  after eating and cannot keep food or fluid down.  Protocols used: Neurologic Deficit-A-AH, Vomiting-A-AH

## 2023-05-03 ENCOUNTER — Encounter: Payer: Self-pay | Admitting: Student

## 2023-05-03 ENCOUNTER — Ambulatory Visit: Admitting: Student

## 2023-05-03 VITALS — BP 102/70 | HR 116 | Temp 98.8°F | Ht 62.0 in | Wt 106.1 lb

## 2023-05-03 DIAGNOSIS — R112 Nausea with vomiting, unspecified: Secondary | ICD-10-CM

## 2023-05-03 DIAGNOSIS — R200 Anesthesia of skin: Secondary | ICD-10-CM

## 2023-05-03 DIAGNOSIS — G40909 Epilepsy, unspecified, not intractable, without status epilepticus: Secondary | ICD-10-CM

## 2023-05-03 DIAGNOSIS — K219 Gastro-esophageal reflux disease without esophagitis: Secondary | ICD-10-CM

## 2023-05-03 DIAGNOSIS — R202 Paresthesia of skin: Secondary | ICD-10-CM | POA: Diagnosis not present

## 2023-05-03 DIAGNOSIS — G621 Alcoholic polyneuropathy: Secondary | ICD-10-CM | POA: Insufficient documentation

## 2023-05-03 DIAGNOSIS — F1010 Alcohol abuse, uncomplicated: Secondary | ICD-10-CM

## 2023-05-03 DIAGNOSIS — R232 Flushing: Secondary | ICD-10-CM

## 2023-05-03 DIAGNOSIS — Z789 Other specified health status: Secondary | ICD-10-CM

## 2023-05-03 DIAGNOSIS — R1319 Other dysphagia: Secondary | ICD-10-CM

## 2023-05-03 MED ORDER — PANTOPRAZOLE SODIUM 40 MG PO TBEC: 40.0000 mg | DELAYED_RELEASE_TABLET | Freq: Two times a day (BID) | ORAL | 3 refills | Status: DC

## 2023-05-03 MED ORDER — ATORVASTATIN CALCIUM 20 MG PO TABS: 20.0000 mg | ORAL_TABLET | Freq: Every day | ORAL | 3 refills | Status: DC

## 2023-05-03 MED ORDER — LEVETIRACETAM 750 MG PO TABS: 750.0000 mg | ORAL_TABLET | Freq: Two times a day (BID) | ORAL | 2 refills | Status: DC

## 2023-05-03 MED ORDER — THIAMINE HCL 100 MG PO TABS: 100.0000 mg | ORAL_TABLET | Freq: Every day | ORAL | 0 refills | Status: DC

## 2023-05-03 MED ORDER — ONDANSETRON HCL 4 MG PO TABS: 4.0000 mg | ORAL_TABLET | Freq: Three times a day (TID) | ORAL | 0 refills | Status: DC | PRN

## 2023-05-03 MED ORDER — FOLIC ACID 1 MG PO TABS: 1.0000 mg | ORAL_TABLET | Freq: Every day | ORAL | 2 refills | Status: DC

## 2023-05-03 MED ORDER — ADULT MULTIVITAMIN W/MINERALS CH: 1.0000 | ORAL_TABLET | Freq: Every day | ORAL | 0 refills | Status: AC

## 2023-05-03 NOTE — Assessment & Plan Note (Signed)
 Globus sensation remains but no choking. GI appt upcoming per above.

## 2023-05-03 NOTE — Assessment & Plan Note (Signed)
 Specifically, the bilateral fingers and toes of recent onset. Strength intact. Tinel negative. 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 She is prescribed vitamins but is not sure she is taking them. - Continue multivitamin, folate, thiamine - CBC next visit

## 2023-05-03 NOTE — Patient Instructions (Addendum)
 Christina Byrd,  For your stomach upset, I will increase your Protonix to twice daily. So take one pill in the morning and one in the evening. I will also give you Zofran to take as needed. This is to reduce nausea. Take it up to three times per day.  Do your best to take your Keppra. You probably had a seizure because of your vomiting. That is not your fault. But try to take small sips of water as best you can to take your medicines.  Your numbness might be due to alcohol use. Over time, alcohol damages our nerves. Please be sure to take your multivitamin, thiamine, and folate. I have refilled these.  Your GI appointment is: Charise Carwin, MD Gastroenterology [address] [place] [address] March 26 @ 1:30   I will check some lab work today.  Please return in 1-2 weeks.  -Dr. Ninfa Meeker

## 2023-05-03 NOTE — Assessment & Plan Note (Addendum)
 She complains of N/V within 5 minutes of every meal ongoing for at least 5 months. No changes in stools. Suspect it is multifactorial. She has a history of GERD and reports reflux with most meals. She also has a history of heavy alcohol consumption, albeit reduced lately, but symptoms ongoing before. Fortunately, she already has a GI referral in place with an appointment at the end of this month, having been hospitalized with this last year. Today she does not look acutely ill, do not feel she needs urgent ER eval. She has a hsitory of AVMs, but no signs of bleeding. I will increase her protonix, provide zofran, and pepto-bismol helps her as well. - pantoprazole increase to 40 BID - Zofran 4 q8h prn - reduce alcohol - GI appt at [facility] on 3/26 - Would check labs but pt refused, will get lipase, CBC, BMP at follow up later this month

## 2023-05-03 NOTE — Assessment & Plan Note (Signed)
 She reports a seizure last night of approx 1 min duration that was witnessed by her husband who is present today. She did not take Keppra due to her GI upset. I suspect she has missed a few doses due to this, and emesis likely impacting absorption. This was her first seizure in awhile. She reports no injuries. Precautions re driving, swimming, heights advised and will check up in 2 weeks. - Continue Keppra 750 BID, though pt takes only 500 in pm due to tremors

## 2023-05-03 NOTE — Assessment & Plan Note (Signed)
 She drinks two shots liquor and 12 oz beer most evenings. She does not want to quit. Suspect alcoholic gastritis per above.

## 2023-05-03 NOTE — Progress Notes (Signed)
 CC: N/V, hand numbness, seizure  HPI:  [name] is a 58 y.o. female living with a history stated below and presents today for above issues. Please see problem based assessment and plan for additional details.  Past Medical History:  Diagnosis Date   GASTROESOPHAGEAL REFLUX, NO ESOPHAGITIS 04/21/2006   Qualifier: Diagnosis of  By: Levada Schilling     GERD (gastroesophageal reflux disease) Dx 1995   Hot flash, menopausal 06/22/2016   Hot flashes 01/30/2020   HTN (hypertension)    Hypotension 10/21/2022   Patient's blood pressure at presentation was 87/65, follow up blood pressure was 81/58. Patient reports regurgitating everything she has taken po for the past month. Denies lightheadedness or dizziness. Discussed GI referral, though patient stated she would be going to the emergency room today for IV fluids.   -GI referral for upper endoscopy placed  -Patient planning on going to the ED for IV flu   Intertrigo 06/22/2016   Nausea & vomiting 12/23/2022   Nausea and vomiting 12/24/2022   Pain and swelling of left ankle 12/02/2014   Seizures (HCC)    Tendinitis of right hip flexor 07/23/2014   Tendonitis, Achilles, right 04/01/2014    Review of Systems: ROS negative except for what is noted on the assessment and plan.  Vitals:   05/03/23 1007  BP: 102/70  Pulse: (!) 116  Temp: 98.8 F (37.1 C)  TempSrc: Oral  SpO2: 100%  Weight: 106 lb 1.6 oz (48.1 kg)  Height: 5\' 2"  (1.575 m)    Physical Exam: Constitutional: well-appearing woman sitting in wheelechair in no acute distress HENT: normocephalic atraumatic, mucous membranes moist Cardiovascular: regular rate and rhythm, no m/r/g Pulmonary/Chest: normal work of breathing on room air, lungs clear to auscultation bilaterally Abdominal: soft, mild L sided tenderness she attributes to hitting a car door, no masses, non-distended, no rebound MSK: normal bulk and tone Neurological: alert & oriented x 3, no focal  deficit Skin: warm and dry Psych: normal mood and behavior  Assessment & Plan:   Patient discussed with Dr.  Deirdre Priest  Nausea & vomiting She complains of N/V within 5 minutes of every meal ongoing for at least 5 months. No changes in stools. Suspect it is multifactorial. She has a history of GERD and reports reflux with most meals. She also has a history of heavy alcohol consumption, albeit reduced lately, but symptoms ongoing before. Fortunately, she already has a GI referral in place with an appointment at the end of this month, having been hospitalized with this last year. Today she does not look acutely ill, do not feel she needs urgent ER eval. She has a hsitory of AVMs, but no signs of bleeding. I will increase her protonix, provide zofran, and pepto-bismol helps her as well. - pantoprazole increase to 40 BID - Zofran 4 q8h prn - reduce alcohol - GI appt at [facility] on 3/26 - Would check labs but pt refused, will get lipase, CBC, BMP at follow up later this month  GERD without esophagitis Increase PPI per above. Upcoming GI appointment.  Dysphagia Globus sensation remains but no choking. GI appt upcoming per above.  Seizure disorder (HCC) She reports a seizure last night of approx 1 min duration that was witnessed by her husband who is present today. She did not take Keppra due to her GI upset. I suspect she has missed a few doses due to this, and emesis likely impacting absorption. This was her first seizure in awhile. She reports no  injuries. Precautions re driving, swimming, heights advised and will check up in 2 weeks. - Continue Keppra 750 BID, though pt takes only 500 in pm due to tremors  Alcohol abuse She drinks two shots liquor and 12 oz beer most evenings. She does not want to quit. Suspect alcoholic gastritis per above.  Numbness and tingling in both hands Specifically, the bilateral fingers and toes of recent onset. Strength intact. Tinel negative. 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 She is prescribed vitamins but is not sure she is taking them. - Continue multivitamin, folate, thiamine - CBC next visit  RTC 1-2 weeks to check on N/V, seizure. Will also go over her medicines as she has trouble remembering them. Will also assess hot flashes. She states she will try to allow a lab draw at that time.  [name], D.O. [facility] Internal Medicine, PGY-1 Phone: [phone] Date 05/03/2023 Time 2:11 PM

## 2023-05-03 NOTE — Assessment & Plan Note (Signed)
 Increase PPI per above. Upcoming GI appointment.

## 2023-05-04 NOTE — Progress Notes (Signed)
 Internal Medicine Clinic Attending  Case discussed with the resident at the time of the visit.  We reviewed the resident's history and exam and pertinent patient test results.  I agree with the assessment, diagnosis, and plan of care documented in the resident's note.

## 2023-05-10 ENCOUNTER — Encounter: Payer: BLUE CROSS/BLUE SHIELD | Admitting: Student

## 2023-05-18 ENCOUNTER — Ambulatory Visit: Payer: Self-pay | Admitting: Student

## 2023-05-18 NOTE — Telephone Encounter (Signed)
  Chief Complaint: hands, feet, legs tingling Symptoms: severe pain, vomiting x 3 months, dark stools Frequency: 3 months  Pertinent Negatives: Patient denies chest pain/ left arm pain, weakness or numbness to face or arms- has to be carried to BR Disposition: [] ED /[] Urgent Care (no appt availability in office) / [] Appointment(In office/virtual)/ []  [facility]/ [] Home Care/ [] Refused Recommended Disposition /[] [place] [facility]/ [x]  Follow-up with PCP Additional Notes: Due to pt's sx (profound weakness, vomiting daily, black stools, weight loss and c/o severe pain to hands, legs and feet, called [facility] and spoke with Chailon. Advised Chailon of pt sx and that this NT made appt but after pt elaborated more sx made acuity higher. Asked for PCP to weigh in to see if appt is appropriate. Call warm transferred.  Copied from CRM [id number]. Topic: Clinical - Red Word Triage >> May 18, 2023  1:56 PM Prudencio Pair wrote: Red Word that prompted transfer to Nurse Triage: Patient states her hands, feet, and legs are tingling. States they have been this way for a while & she is in pain & tired of hurting. Wants to know if she needs to go to the ER? States she's also having pains in her back as well. Pt asked about GI referral. Advised that referral was made but no appt was ever made and status "closed." Reason for Disposition  Patient sounds very sick or weak to the triager  Answer Assessment - Initial Assessment Questions 1. SYMPTOM: "What is the main symptom you are concerned about?" (e.g., weakness, numbness)     Tingling  2. ONSET: "When did this start?" (minutes, hours, days; while sleeping)     months 3. LAST NORMAL: "When was the last time you (the patient) were normal (no symptoms)?"     > 3 months ago 4. PATTERN "Does this come and go, or has it been constant since it started?"  "Is it present now?"     Present now- constant 5. CARDIAC SYMPTOMS: "Have you had any of the following  symptoms: chest pain, difficulty breathing, palpitations?"     no 6. NEUROLOGIC SYMPTOMS: "Have you had any of the following symptoms: headache, dizziness, vision loss, double vision, changes in speech, unsteady on your feet?"     Hands, feet, legs or tingling pain 10, weakness (has to be carried to bathroom),  7. OTHER SYMPTOMS: "Do you have any other symptoms?"     Pain under left shoulder blade/vomiting daily, dark stools  Protocols used: Neurologic Deficit-A-AH

## 2023-05-18 NOTE — Telephone Encounter (Addendum)
 I talked to pt who stated she has been hurting x 3 months. C/o feet/hands tingling; stomach pain, left shoulder pain. Unable to eat or drink; cannot keep anything down. Unable to walk - stated her husband has been carrying her. I advised pt to go to the ER - stated she did not want to go but  stated she will keep her appt tomorrow am (made  by [facility]). Stated she going to lie down and "I will be alright".

## 2023-05-19 ENCOUNTER — Ambulatory Visit: Payer: Self-pay | Admitting: Student

## 2023-05-25 ENCOUNTER — Ambulatory Visit: Payer: Self-pay | Admitting: Student

## 2023-05-26 ENCOUNTER — Encounter: Payer: Self-pay | Admitting: Student

## 2023-05-26 ENCOUNTER — Ambulatory Visit (INDEPENDENT_AMBULATORY_CARE_PROVIDER_SITE_OTHER): Payer: Self-pay | Admitting: Student

## 2023-05-26 VITALS — BP 90/75 | HR 113 | Temp 99.4°F | Ht 62.0 in | Wt 106.0 lb

## 2023-05-26 DIAGNOSIS — R202 Paresthesia of skin: Secondary | ICD-10-CM

## 2023-05-26 DIAGNOSIS — E876 Hypokalemia: Secondary | ICD-10-CM

## 2023-05-26 DIAGNOSIS — F1010 Alcohol abuse, uncomplicated: Secondary | ICD-10-CM | POA: Diagnosis not present

## 2023-05-26 DIAGNOSIS — R112 Nausea with vomiting, unspecified: Secondary | ICD-10-CM | POA: Diagnosis not present

## 2023-05-26 DIAGNOSIS — R200 Anesthesia of skin: Secondary | ICD-10-CM | POA: Diagnosis not present

## 2023-05-26 DIAGNOSIS — J301 Allergic rhinitis due to pollen: Secondary | ICD-10-CM

## 2023-05-26 DIAGNOSIS — G621 Alcoholic polyneuropathy: Secondary | ICD-10-CM | POA: Diagnosis not present

## 2023-05-26 DIAGNOSIS — J309 Allergic rhinitis, unspecified: Secondary | ICD-10-CM

## 2023-05-26 DIAGNOSIS — I700 Atherosclerosis of aorta: Secondary | ICD-10-CM | POA: Diagnosis not present

## 2023-05-26 DIAGNOSIS — R7309 Other abnormal glucose: Secondary | ICD-10-CM | POA: Diagnosis not present

## 2023-05-26 LAB — LIPASE, BLOOD: Lipase: 106 U/L — ABNORMAL HIGH (ref 11–51)

## 2023-05-26 LAB — COMPREHENSIVE METABOLIC PANEL WITH GFR
ALT: 54 U/L — ABNORMAL HIGH (ref 0–44)
AST: 148 U/L — ABNORMAL HIGH (ref 15–41)
Albumin: 2.2 g/dL — ABNORMAL LOW (ref 3.5–5.0)
Alkaline Phosphatase: 213 U/L — ABNORMAL HIGH (ref 38–126)
Anion gap: 19 — ABNORMAL HIGH (ref 5–15)
BUN: 6 mg/dL (ref 6–20)
CO2: 17 mmol/L — ABNORMAL LOW (ref 22–32)
Calcium: 6.6 mg/dL — ABNORMAL LOW (ref 8.9–10.3)
Chloride: 101 mmol/L (ref 98–111)
Creatinine, Ser: 1.05 mg/dL — ABNORMAL HIGH (ref 0.44–1.00)
GFR, Estimated: >60 mL/min (ref >60)
Glucose, Bld: 62 mg/dL — ABNORMAL LOW (ref 70–99)
Potassium: 3.3 mmol/L — ABNORMAL LOW (ref 3.5–5.1)
Sodium: 137 mmol/L (ref 135–145)
Total Bilirubin: 0.7 mg/dL (ref 0.0–1.2)
Total Protein: 5.7 g/dL — ABNORMAL LOW (ref 6.5–8.1)

## 2023-05-26 LAB — CBC
HCT: 25.7 % — ABNORMAL LOW (ref 36.0–46.0)
Hemoglobin: 8.5 g/dL — ABNORMAL LOW (ref 12.0–15.0)
MCH: 30.4 pg (ref 26.0–34.0)
MCHC: 33.1 g/dL (ref 30.0–36.0)
MCV: 91.8 fL (ref 80.0–100.0)
Platelets: 111 10*3/uL — ABNORMAL LOW (ref 150–400)
RBC: 2.8 MIL/uL — ABNORMAL LOW (ref 3.87–5.11)
RDW: 19 % — ABNORMAL HIGH (ref 11.5–15.5)
WBC: 4.1 10*3/uL (ref 4.0–10.5)
nRBC: 2.7 % — ABNORMAL HIGH (ref 0.0–0.2)

## 2023-05-26 LAB — POCT GLYCOSYLATED HEMOGLOBIN (HGB A1C): Hemoglobin A1C: 3.7 % — AB (ref 4.0–5.6)

## 2023-05-26 MED ORDER — CETIRIZINE HCL 10 MG PO TABS: 10.0000 mg | ORAL_TABLET | Freq: Every day | ORAL | 2 refills | Status: DC

## 2023-05-26 MED ORDER — PROCHLORPERAZINE MALEATE 10 MG PO TABS: 10.0000 mg | ORAL_TABLET | Freq: Four times a day (QID) | ORAL | 0 refills | Status: DC | PRN

## 2023-05-26 MED ORDER — DULOXETINE HCL 30 MG PO CPEP: 30.0000 mg | ORAL_CAPSULE | Freq: Every day | ORAL | 2 refills | Status: DC

## 2023-05-26 NOTE — Progress Notes (Addendum)
 CC: N/V ongoing  HPI:  [name] is a 58 y.o. female with a PMH stated below who presents today for checkup of ongoing nausea and vomiting.  Please see problem based assessment and plan for additional details.  Past Medical History:  Diagnosis Date   GASTROESOPHAGEAL REFLUX, NO ESOPHAGITIS 04/21/2006   Qualifier: Diagnosis of  By: Levada Schilling     GERD (gastroesophageal reflux disease) Dx 1995   Hot flash, menopausal 06/22/2016   Hot flashes 01/30/2020   HTN (hypertension)    Hypotension 10/21/2022   Patient's blood pressure at presentation was 87/65, follow up blood pressure was 81/58. Patient reports regurgitating everything she has taken po for the past month. Denies lightheadedness or dizziness. Discussed GI referral, though patient stated she would be going to the emergency room today for IV fluids.   -GI referral for upper endoscopy placed  -Patient planning on going to the ED for IV flu   Intertrigo 06/22/2016   Nausea & vomiting 12/23/2022   Nausea and vomiting 12/24/2022   Pain and swelling of left ankle 12/02/2014   Seizures (HCC)    Tendinitis of right hip flexor 07/23/2014   Tendonitis, Achilles, right 04/01/2014    Review of Systems: ROS negative except for what is noted on the assessment and plan.  Vitals:   05/26/23 1614  BP: 90/75  Pulse: (!) 113  Temp: 99.4 F (37.4 C)  TempSrc: Oral  SpO2: 100%  Weight: 106 lb (48.1 kg)  Height: 5\' 2"  (1.575 m)    Physical Exam: Constitutional: chronically ill-appearing woman in no acute distress. She carries a emesis bag and dry heaves on occasion. Eyes: no scleral icterus Cardiovascular: fast rate and regular rhythm, no m/r/g Pulmonary/Chest: normal work of breathing on room air, lungs clear to auscultation bilaterally Abdominal: soft, mild diffuse tenderness, non-distended, normal bowel sounds, no masses or guarding MSK: normal bulk and tone Neurological: alert & oriented x 3, no focal  deficit Skin: warm and dry Psych: normal mood and behavior  Assessment & Plan:   Patient discussed with Dr. Sol Blazing  Nausea & vomiting Today is follow-up from a visit last month for ongoing N/V. This issue is ongoing for several months. She has history of globus sensation, GERD, heavy alcohol use and so was recently sent for GI evaluation with initial appointment on 05/18/23, but unfortunately she did not make it to the appointment (number provided and she will attempt to reschedule).  Today the issue is not better or worse. Frequent nausea and emesis most associated with meals. No severe or localized abdominal pain and no changes in bowels, no back pain or blood per rectum. She can keep food and drink down in small servings and is able to keep her medicines down. She is losing weight, nearly 20 lbs in 6 months.  Primarily, I suspect this issue is related to her alcohol use with continued daily drinking of beer - her symptoms are consistent with gastritis or perhaps chronic pancreatitis (albeit stools normal), could consider chronic mesenteric ischemia.   An in-hospital upper endoscopy was performed in November '24 per complaint of dysphagia. Exam was unrevealing with recommendations of a PPI and alcohol cessation and outpatient GI follow-up. Since then, these symptoms have progressed.  I worry there could come a point where she needs hospital evaluation of this issue, but as she remains able to keep some food and drink down and is not missing medicines, I do not think it is needed now. For now, I  will continue with her increased PPI, attempt alternative symptom control, and collect labs, which were deferred at last visit.  - Lipase, CMP, CBC - compazine 10 q6 for symptomatic relief (Zofran didn't help) - continue protonix 40 BID - Advise that acute worsening, fever, inability to keep food and medicines down (she takes keppra daily), would be reasons to go to ER  ADD: Lab results consistent with  above theory with some liver dysfunction, stable renal function, as well as starvation AGMA in the setting of low oral intake. I will replace her K and bring her back to clinic for reevaluation next week.  Allergic rhinitis Refill Zyrtec and flonase  Alcoholic peripheral neuropathy (HCC) Mildly painful numbness of the bilateral fingers and toes with intact strength, tinel negative, in setting of alcohol use. Went over medicines today and reminded her to take her vitamins, which she has a supply of. I will start cymbalta as well. - Continue thiamine, folate, MV - duloxetine 30 daily - Alcohol cessation  Alcohol abuse She continues to drink beer and spirits. She shared that part of the reason she drinks is because it helps relieve the choking sensation she gets in her throat. Per above, recent endoscopy unrevealing.  RTC in 1 week for checkup of above. Consider repeat of CBC, CMP, Lipase as indicated. Thank you very much.  [name], D.O. [facility] Internal Medicine, PGY-1 Phone: [phone] Date 05/30/2023 Time 9:52 AM

## 2023-05-26 NOTE — Assessment & Plan Note (Addendum)
 Today is follow-up from a visit last month for ongoing N/V. This issue is ongoing for several months. She has history of globus sensation, GERD, heavy alcohol use and so was recently sent for GI evaluation with initial appointment on 05/18/23, but unfortunately she did not make it to the appointment (number provided and she will attempt to reschedule).  Today the issue is not better or worse. Frequent nausea and emesis most associated with meals. No severe or localized abdominal pain and no changes in bowels, no back pain or blood per rectum. She can keep food and drink down in small servings and is able to keep her medicines down. She is losing weight, nearly 20 lbs in 6 months.  Primarily, I suspect this issue is related to her alcohol use with continued daily drinking of beer - her symptoms are consistent with gastritis or perhaps chronic pancreatitis (albeit stools normal), could consider chronic mesenteric ischemia.   An in-hospital upper endoscopy was performed in November '24 per complaint of dysphagia. Exam was unrevealing with recommendations of a PPI and alcohol cessation and outpatient GI follow-up. Since then, these symptoms have progressed.  I worry there could come a point where she needs hospital evaluation of this issue, but as she remains able to keep some food and drink down and is not missing medicines, I do not think it is needed now. For now, I will continue with her increased PPI, attempt alternative symptom control, and collect labs, which were deferred at last visit.  - Lipase, CMP, CBC - compazine 10 q6 for symptomatic relief (Zofran didn't help) - continue protonix 40 BID - Advise that acute worsening, fever, inability to keep food and medicines down (she takes keppra daily), would be reasons to go to ER  ADD: Lab results consistent with above theory with some liver dysfunction, stable renal function, as well as starvation AGMA in the setting of low oral intake. I will replace  her K and bring her back to clinic for reevaluation next week.

## 2023-05-26 NOTE — Patient Instructions (Addendum)
 Take compazine every 6 hours as needed for nausea Take duloxetine daily for nerve pain Take cetirizine daily for allergies  Heart Doctor [facility] at [facility] [address] [place], [place] [address] [phone]  Neurology/Seizure doctor [facility] Neurology [address] [place], [place] [address] [phone]  Stomach Doctor [facility] - [facility] [address] [place], [place] [address] Directions [phone]

## 2023-05-27 MED ORDER — POTASSIUM CHLORIDE CRYS ER 20 MEQ PO TBCR: 20.0000 meq | EXTENDED_RELEASE_TABLET | Freq: Two times a day (BID) | ORAL | 0 refills | Status: DC

## 2023-05-27 MED ORDER — FLUTICASONE PROPIONATE 50 MCG/ACT NA SUSP: NASAL | 2 refills | Status: DC

## 2023-05-27 NOTE — Assessment & Plan Note (Signed)
 She continues to drink beer and spirits. She shared that part of the reason she drinks is because it helps relieve the choking sensation she gets in her throat. Per above, recent endoscopy unrevealing.

## 2023-05-27 NOTE — Assessment & Plan Note (Signed)
 Mildly painful numbness of the bilateral fingers and toes with intact strength, tinel negative, in setting of alcohol use. Went over medicines today and reminded her to take her vitamins, which she has a supply of. I will start cymbalta as well. - Continue thiamine, folate, MV - duloxetine 30 daily - Alcohol cessation

## 2023-05-27 NOTE — Assessment & Plan Note (Addendum)
 Refill Zyrtec and flonase

## 2023-05-30 NOTE — Progress Notes (Signed)
 Internal Medicine Clinic Attending  Case discussed with the resident at the time of the visit.  We reviewed the resident's history and exam and pertinent patient test results.  I agree with the assessment, diagnosis, and plan of care documented in the resident's note.

## 2023-05-31 ENCOUNTER — Encounter: Admitting: Student

## 2023-05-31 NOTE — Progress Notes (Deleted)
 Established Patient Office Visit  Subjective   Patient ID: Christina Byrd, female    DOB: 1965/02/28  Age: 58 y.o. MRN: [id number]  No chief complaint on file.   Christina Byrd is a 58 y.o. who presents to the clinic for a one week follow up for nausea/vomiting.  At her visit one week prior, the following lab abnormalities were found: Lipase 106 (chronically elevated), Hypokalemia 3.3, AST 148, ALT 54, alkphos 213, and anion gap 19. She was prescribed potassium replacement, compazine 10mg  q6, and Protonix 40mg  BID.    Today, she reports feeling **** F/C How soon after eating? Still eating/drinking Keeping medications down? Any marijuana Abdominal pain Still drinking?     Repeat CMP GI appointment? -  HIDA scan in November: normal  Cholelithiasis on RUQ [place] Endoscopy in November was normal  Hepatitis Panel in November was non-reactive  CT abdomen/pelvis normal November   Please see problem based assessment and plan for additional details  Patient Active Problem List   Diagnosis Date Noted   Aortic atherosclerosis (HCC) 05/26/2023   Nausea & vomiting 05/03/2023   Alcoholic peripheral neuropathy (HCC) 05/03/2023   Breakthrough seizure (HCC) 01/23/2023   Localized skin desquamation 01/06/2023   Hypothyroidism 01/06/2023   Vitamin D deficiency 01/06/2023   CKD (chronic kidney disease) stage 3, GFR 30-59 ml/min (HCC) 01/06/2023   Chronic diarrhea 12/28/2022   Heme positive stool 12/28/2022   Malnutrition of moderate degree 12/27/2022   Electrolyte abnormality 12/26/2022   Hypocalcemia 11/19/2022   Hypoalbuminemia due to protein-calorie malnutrition (HCC) 11/19/2022   Hyperlipidemia 11/19/2022   Elevated lipase 11/19/2022   Osteoarthritis 09/02/2022   History of noncompliance with medical treatment 07/27/2022   Hepatic steatosis 07/27/2022   Acute pain of right knee 07/08/2022   Transaminitis 07/08/2022   Macrocytic anemia 07/08/2022   Duodenal ulcer  hemorrhage 06/03/2022   AVM (arteriovenous malformation) of stomach, 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   Anemia due to chronic blood loss 06/03/2022   Dysphagia 03/28/2020   Cervicalgia 03/28/2020   Hot flashes 01/30/2020   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   Depression 07/08/2016   Seizure disorder (HCC) 07/08/2016   Cocaine use    Gout of left ankle 10/09/2015   Left hip pain 09/02/2015   Colonoscopy refused 09/02/2015   Alcohol abuse 09/02/2015   Hyperuricemia 05/27/2015   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  Last CBC Lab Results  Component Value Date   WBC 4.1 05/26/2023   HGB 8.5 (L) 05/26/2023   HCT 25.7 (L) 05/26/2023   MCV 91.8 05/26/2023   MCH 30.4 05/26/2023   RDW 19.0 (H) 05/26/2023   PLT 111 (L) 05/26/2023   Last metabolic panel Lab Results  Component Value Date   GLUCOSE 62 (L) 05/26/2023   NA 137 05/26/2023   K 3.3 (L) 05/26/2023   CL 101 05/26/2023   CO2 17 (L)  05/26/2023   BUN 6 05/26/2023   CREATININE 1.05 (H) 05/26/2023   GFRNONAA >60 05/26/2023   CALCIUM 6.6 (L) 05/26/2023   PHOS 2.6 12/30/2022   PROT 5.7 (L) 05/26/2023   ALBUMIN 2.2 (L) 05/26/2023   LABGLOB 3.6 04/27/2022   AGRATIO 1.0 (L) 04/27/2022   BILITOT 0.7 05/26/2023   ALKPHOS 213 (H) 05/26/2023   AST 148 (H) 05/26/2023   ALT 54 (H) 05/26/2023   ANIONGAP 19 (H) 05/26/2023   Last lipids Lab Results  Component Value Date   CHOL 154 04/27/2022   HDL 87  04/27/2022   LDLCALC 48 04/27/2022   LDLDIRECT 153 (H) 01/30/2008   TRIG 109 04/27/2022   CHOLHDL 1.8 04/27/2022      The 10-year ASCVD risk score (Arnett DK, et al., 2019) is: 1.6%    Assessment & Plan:   Problem List Items Addressed This Visit   None   No follow-ups on file.    Faith Rogue, DO

## 2023-06-06 ENCOUNTER — Ambulatory Visit (INDEPENDENT_AMBULATORY_CARE_PROVIDER_SITE_OTHER): Admitting: Student

## 2023-06-06 VITALS — BP 103/79 | HR 88 | Temp 98.7°F | Ht 62.0 in | Wt 105.7 lb

## 2023-06-06 DIAGNOSIS — G621 Alcoholic polyneuropathy: Secondary | ICD-10-CM

## 2023-06-06 DIAGNOSIS — E46 Unspecified protein-calorie malnutrition: Secondary | ICD-10-CM | POA: Diagnosis not present

## 2023-06-06 DIAGNOSIS — E8809 Other disorders of plasma-protein metabolism, not elsewhere classified: Secondary | ICD-10-CM

## 2023-06-06 MED ORDER — GABAPENTIN 100 MG PO CAPS: 100.0000 mg | ORAL_CAPSULE | Freq: Three times a day (TID) | ORAL | 2 refills | Status: DC

## 2023-06-06 NOTE — Patient Instructions (Addendum)
 Thank you, [name] for allowing us  to provide your care today. Today we discussed neuropathy. I have started you on a medication called gabapentin. Take 100 mg three times day...Christina Byrd please try to stop drinking alcohol, wean your self off the alcohol slowly to avoid withdrawal symptoms.   I have ordered the following labs for you:   Lab Orders         CMP14 + Anion Gap       I have ordered the following medication/changed the following medications:    Start the following medications: Meds ordered this encounter  Medications   gabapentin (NEURONTIN) 100 MG capsule    Sig: Take 1 capsule (100 mg total) by mouth 3 (three) times daily.    Dispense:  90 capsule    Refill:  2     Follow up: 1 week telehealth    Remember: To schedule an appointment with the stomach doctors.   Should you have any questions or concerns please call the internal medicine clinic at [phone].     Please note that our late policy has changed.  If you are more than 15 minutes late to your appointment, you may be asked to reschedule your appointment.  Dr. Sharlon Deacon, D.O. [facility]

## 2023-06-06 NOTE — Progress Notes (Unsigned)
 Established Patient Office Visit  Subjective   Patient ID: Christina Byrd, female    DOB: 1965/09/20  Age: 58 y.o. MRN: [id number]  Chief Complaint  Patient presents with   Pain    Pain all night in hands and feet # 10 / runny nose since last office visit- gagging.     CAFFIE SOTTO is a 58 y.o. who presents to the clinic for hand/feet pain. Started hurting a while ago   Burning pain at night, can't walk for 1.5 months    Alcoholic neuropathy: "a little at a time", 1/5th a week Numbing/tingling of both hands/feet    Rx: thiamine, folate, MV, duloxetine 30mg  daily.... Unsure what she is taking   1 week telehealth    Doument late Document refusal of labs     Patient Active Problem List   Diagnosis Date Noted   Aortic atherosclerosis (HCC) 05/26/2023   Nausea & vomiting 05/03/2023   Alcoholic peripheral neuropathy (HCC) 05/03/2023   Breakthrough seizure (HCC) 01/23/2023   Localized skin desquamation 01/06/2023   Hypothyroidism 01/06/2023   Vitamin D deficiency 01/06/2023   CKD (chronic kidney disease) stage 3, GFR 30-59 ml/min (HCC) 01/06/2023   Chronic diarrhea 12/28/2022   Heme positive stool 12/28/2022   Malnutrition of moderate degree 12/27/2022   Electrolyte abnormality 12/26/2022   Hypocalcemia 11/19/2022   Hypoalbuminemia due to protein-calorie malnutrition (HCC) 11/19/2022   Hyperlipidemia 11/19/2022   Elevated lipase 11/19/2022   Osteoarthritis 09/02/2022   History of noncompliance with medical treatment 07/27/2022   Hepatic steatosis 07/27/2022   Acute pain of right knee 07/08/2022   Transaminitis 07/08/2022   Macrocytic anemia 07/08/2022   Duodenal ulcer hemorrhage 06/03/2022   AVM (arteriovenous malformation) of stomach, 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   Anemia due to chronic blood loss 06/03/2022   Dysphagia 03/28/2020   Cervicalgia 03/28/2020   Hot flashes 01/30/2020   Idiopathic chronic gout of right knee without tophus  01/30/2020   Depression 07/08/2016   Seizure disorder (HCC) 07/08/2016   Cocaine use    Gout of left ankle 10/09/2015   Left hip pain 09/02/2015   Colonoscopy refused 09/02/2015   Alcohol abuse 09/02/2015   Hyperuricemia 05/27/2015   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   Left shoulder pain 04/25/2015   Allergic rhinitis 05/13/2014   GERD without esophagitis 04/01/2014   Osteoarthritis of left knee 12/11/2013      Objective:     BP 98/72 (BP Location: Left Arm, Patient Position: Sitting, Cuff Size: Small)   Pulse (!) 123   Temp 98.7 F (37.1 C) (Oral)   Ht 5\' 2"  (1.575 m)   Wt 105 lb 11.2 oz (47.9 kg)   LMP 08/23/2011   SpO2 99%   BMI 19.33 kg/m  BP Readings from Last 3 Encounters:  06/06/23 98/72  05/26/23 90/75  05/03/23 102/70   Wt Readings from Last 3 Encounters:  06/06/23 105 lb 11.2 oz (47.9 kg)  05/26/23 106 lb (48.1 kg)  05/03/23 106 lb 1.6 oz (48.1 kg)      Physical Exam   No results found for any visits on 06/06/23.  Last metabolic panel Lab Results  Component Value Date   GLUCOSE 62 (L) 05/26/2023   NA 137 05/26/2023   K 3.3 (L) 05/26/2023   CL 101 05/26/2023   CO2 17 (L) 05/26/2023   BUN 6 05/26/2023   CREATININE 1.05 (H) 05/26/2023   GFRNONAA >60 05/26/2023   CALCIUM 6.6 (L) 05/26/2023   PHOS  2.6 12/30/2022   PROT 5.7 (L) 05/26/2023   ALBUMIN 2.2 (L) 05/26/2023   LABGLOB 3.6 04/27/2022   AGRATIO 1.0 (L) 04/27/2022   BILITOT 0.7 05/26/2023   ALKPHOS 213 (H) 05/26/2023   AST 148 (H) 05/26/2023   ALT 54 (H) 05/26/2023   ANIONGAP 19 (H) 05/26/2023   Last lipids Lab Results  Component Value Date   CHOL 154 04/27/2022   HDL 87 04/27/2022   LDLCALC 48 04/27/2022   LDLDIRECT 153 (H) 01/30/2008   TRIG 109 04/27/2022   CHOLHDL 1.8 04/27/2022   Last hemoglobin A1c Lab Results  Component Value Date   HGBA1C 3.7 (A) 05/26/2023      The 10-year ASCVD risk score (Arnett DK, et al., 2019) is: 2.2%    Assessment &  Plan:   Problem List Items Addressed This Visit   None   No follow-ups on file.    Aurora Lees, DO

## 2023-06-07 NOTE — Assessment & Plan Note (Signed)
 Patient's alcoholic neuropathy symptoms currently progressing, her symptom burden is mainly present in the bilateral feet.  She is currently on a regimen of duloxetine 30 mg, thiamine, folate, multivitamin.  Patient is still consuming alcohol daily she does report decreasing the amount to hopefully stop drinking alcohol.  On exam, she has classic findings of stocking glove peripheral neuropathy from alcoholic peripheral neuropathy the, she does have some mild trace nonpitting edema with bilateral feet.  I do suspect that her symptoms neuropathy are due to her continued use of alcohol. Plan: - Start gabapentin 100 mg 3 times daily -Continue current regimen of duloxetine 30 mg, thiamine, folate, multivitamin -Patient stated that she will continue to decrease her alcohol amount with hopes of complete cessation of alcohol use - Patient refused CMP today -Patient was instructed to make a telehealth appointment in 1 week to discuss her other health conditions including her chronic nausea/vomiting

## 2023-06-07 NOTE — Progress Notes (Signed)
 Internal Medicine Clinic Attending  Case discussed with the resident at the time of the visit.  We reviewed the resident's history and exam and pertinent patient test results.  I agree with the assessment, diagnosis, and plan of care documented in the resident's note.     Patient declined to have CMP checked today. Will repeat at next in-person visit.  I recommend that we have clear boundaries about our clinic late policies - Dr Sharlon Deacon kindly saw the patient well after her appointment time today.

## 2023-06-13 ENCOUNTER — Telehealth: Admitting: Student

## 2023-06-13 ENCOUNTER — Ambulatory Visit: Payer: Self-pay

## 2023-06-13 ENCOUNTER — Inpatient Hospital Stay (HOSPITAL_COMMUNITY)
Admission: EM | Admit: 2023-06-13 | Discharge: 2023-06-22 | DRG: 640 | Disposition: A | Discharge status: Home / Self Care | Attending: Infectious Diseases | Admitting: Infectious Diseases

## 2023-06-13 ENCOUNTER — Encounter (HOSPITAL_COMMUNITY): Payer: Self-pay

## 2023-06-13 ENCOUNTER — Emergency Department (HOSPITAL_COMMUNITY)

## 2023-06-13 ENCOUNTER — Other Ambulatory Visit: Payer: Self-pay

## 2023-06-13 DIAGNOSIS — Z8249 Family history of ischemic heart disease and other diseases of the circulatory system: Secondary | ICD-10-CM

## 2023-06-13 DIAGNOSIS — G40909 Epilepsy, unspecified, not intractable, without status epilepticus: Secondary | ICD-10-CM | POA: Diagnosis present

## 2023-06-13 DIAGNOSIS — G40919 Epilepsy, unspecified, intractable, without status epilepticus: Secondary | ICD-10-CM

## 2023-06-13 DIAGNOSIS — E874 Mixed disorder of acid-base balance: Secondary | ICD-10-CM | POA: Diagnosis present

## 2023-06-13 DIAGNOSIS — D638 Anemia in other chronic diseases classified elsewhere: Secondary | ICD-10-CM | POA: Diagnosis not present

## 2023-06-13 DIAGNOSIS — K6389 Other specified diseases of intestine: Secondary | ICD-10-CM | POA: Diagnosis present

## 2023-06-13 DIAGNOSIS — K219 Gastro-esophageal reflux disease without esophagitis: Secondary | ICD-10-CM | POA: Diagnosis not present

## 2023-06-13 DIAGNOSIS — D61818 Other pancytopenia: Secondary | ICD-10-CM | POA: Diagnosis present

## 2023-06-13 DIAGNOSIS — E46 Unspecified protein-calorie malnutrition: Secondary | ICD-10-CM | POA: Diagnosis present

## 2023-06-13 DIAGNOSIS — R1310 Dysphagia, unspecified: Secondary | ICD-10-CM

## 2023-06-13 DIAGNOSIS — Z87891 Personal history of nicotine dependence: Secondary | ICD-10-CM

## 2023-06-13 DIAGNOSIS — R339 Retention of urine, unspecified: Secondary | ICD-10-CM | POA: Diagnosis present

## 2023-06-13 DIAGNOSIS — Z885 Allergy status to narcotic agent status: Secondary | ICD-10-CM

## 2023-06-13 DIAGNOSIS — G621 Alcoholic polyneuropathy: Secondary | ICD-10-CM | POA: Diagnosis not present

## 2023-06-13 DIAGNOSIS — R5381 Other malaise: Secondary | ICD-10-CM

## 2023-06-13 DIAGNOSIS — R0989 Other specified symptoms and signs involving the circulatory and respiratory systems: Secondary | ICD-10-CM | POA: Diagnosis not present

## 2023-06-13 DIAGNOSIS — M1A0610 Idiopathic chronic gout, right knee, without tophus (tophi): Secondary | ICD-10-CM | POA: Diagnosis present

## 2023-06-13 DIAGNOSIS — E43 Unspecified severe protein-calorie malnutrition: Secondary | ICD-10-CM | POA: Diagnosis present

## 2023-06-13 DIAGNOSIS — Z881 Allergy status to other antibiotic agents status: Secondary | ICD-10-CM | POA: Diagnosis not present

## 2023-06-13 DIAGNOSIS — R000 Tachycardia, unspecified: Secondary | ICD-10-CM | POA: Diagnosis not present

## 2023-06-13 DIAGNOSIS — D62 Acute posthemorrhagic anemia: Secondary | ICD-10-CM | POA: Diagnosis present

## 2023-06-13 DIAGNOSIS — Z7984 Long term (current) use of oral hypoglycemic drugs: Secondary | ICD-10-CM

## 2023-06-13 DIAGNOSIS — E860 Dehydration: Secondary | ICD-10-CM | POA: Diagnosis not present

## 2023-06-13 DIAGNOSIS — R627 Adult failure to thrive: Secondary | ICD-10-CM | POA: Diagnosis present

## 2023-06-13 DIAGNOSIS — Z79899 Other long term (current) drug therapy: Secondary | ICD-10-CM

## 2023-06-13 DIAGNOSIS — F10930 Alcohol use, unspecified with withdrawal, uncomplicated: Secondary | ICD-10-CM | POA: Diagnosis not present

## 2023-06-13 DIAGNOSIS — N179 Acute kidney failure, unspecified: Secondary | ICD-10-CM | POA: Diagnosis not present

## 2023-06-13 DIAGNOSIS — R918 Other nonspecific abnormal finding of lung field: Secondary | ICD-10-CM | POA: Diagnosis not present

## 2023-06-13 DIAGNOSIS — F10230 Alcohol dependence with withdrawal, uncomplicated: Secondary | ICD-10-CM | POA: Diagnosis present

## 2023-06-13 DIAGNOSIS — E872 Acidosis, unspecified: Secondary | ICD-10-CM | POA: Diagnosis not present

## 2023-06-13 DIAGNOSIS — F10939 Alcohol use, unspecified with withdrawal, unspecified: Secondary | ICD-10-CM | POA: Diagnosis present

## 2023-06-13 DIAGNOSIS — F1090 Alcohol use, unspecified, uncomplicated: Secondary | ICD-10-CM | POA: Diagnosis not present

## 2023-06-13 DIAGNOSIS — I10 Essential (primary) hypertension: Secondary | ICD-10-CM | POA: Diagnosis present

## 2023-06-13 DIAGNOSIS — D649 Anemia, unspecified: Secondary | ICD-10-CM | POA: Diagnosis not present

## 2023-06-13 DIAGNOSIS — F1010 Alcohol abuse, uncomplicated: Secondary | ICD-10-CM | POA: Diagnosis not present

## 2023-06-13 DIAGNOSIS — E876 Hypokalemia: Secondary | ICD-10-CM | POA: Diagnosis not present

## 2023-06-13 DIAGNOSIS — K449 Diaphragmatic hernia without obstruction or gangrene: Secondary | ICD-10-CM | POA: Diagnosis not present

## 2023-06-13 DIAGNOSIS — E8809 Other disorders of plasma-protein metabolism, not elsewhere classified: Secondary | ICD-10-CM | POA: Diagnosis not present

## 2023-06-13 DIAGNOSIS — E861 Hypovolemia: Secondary | ICD-10-CM | POA: Diagnosis present

## 2023-06-13 DIAGNOSIS — J90 Pleural effusion, not elsewhere classified: Secondary | ICD-10-CM | POA: Diagnosis not present

## 2023-06-13 DIAGNOSIS — K529 Noninfective gastroenteritis and colitis, unspecified: Secondary | ICD-10-CM | POA: Diagnosis not present

## 2023-06-13 DIAGNOSIS — E039 Hypothyroidism, unspecified: Secondary | ICD-10-CM | POA: Diagnosis not present

## 2023-06-13 DIAGNOSIS — R062 Wheezing: Secondary | ICD-10-CM | POA: Diagnosis not present

## 2023-06-13 DIAGNOSIS — K224 Dyskinesia of esophagus: Secondary | ICD-10-CM | POA: Diagnosis present

## 2023-06-13 DIAGNOSIS — Z8639 Personal history of other endocrine, nutritional and metabolic disease: Secondary | ICD-10-CM

## 2023-06-13 DIAGNOSIS — J690 Pneumonitis due to inhalation of food and vomit: Secondary | ICD-10-CM | POA: Diagnosis not present

## 2023-06-13 DIAGNOSIS — Z888 Allergy status to other drugs, medicaments and biological substances status: Secondary | ICD-10-CM

## 2023-06-13 DIAGNOSIS — Z560 Unemployment, unspecified: Secondary | ICD-10-CM

## 2023-06-13 DIAGNOSIS — M1712 Unilateral primary osteoarthritis, left knee: Secondary | ICD-10-CM

## 2023-06-13 LAB — COMPREHENSIVE METABOLIC PANEL WITH GFR
ALT: 38 U/L (ref 0–44)
AST: 76 U/L — ABNORMAL HIGH (ref 15–41)
Albumin: 2.2 g/dL — ABNORMAL LOW (ref 3.5–5.0)
Alkaline Phosphatase: 164 U/L — ABNORMAL HIGH (ref 38–126)
Anion gap: 20 — ABNORMAL HIGH (ref 5–15)
BUN: 9 mg/dL (ref 6–20)
CO2: 13 mmol/L — ABNORMAL LOW (ref 22–32)
Calcium: 5.2 mg/dL — CL (ref 8.9–10.3)
Chloride: 101 mmol/L (ref 98–111)
Creatinine, Ser: 1.4 mg/dL — ABNORMAL HIGH (ref 0.44–1.00)
GFR, Estimated: 44 mL/min — ABNORMAL LOW
Glucose, Bld: 69 mg/dL — ABNORMAL LOW (ref 70–99)
Potassium: 4.5 mmol/L (ref 3.5–5.1)
Sodium: 134 mmol/L — ABNORMAL LOW (ref 135–145)
Total Bilirubin: 0.6 mg/dL (ref 0.0–1.2)
Total Protein: 6.2 g/dL — ABNORMAL LOW (ref 6.5–8.1)

## 2023-06-13 LAB — CBC
HCT: 25.6 % — ABNORMAL LOW (ref 36.0–46.0)
Hemoglobin: 8.6 g/dL — ABNORMAL LOW (ref 12.0–15.0)
MCH: 30.7 pg (ref 26.0–34.0)
MCHC: 33.6 g/dL (ref 30.0–36.0)
MCV: 91.4 fL (ref 80.0–100.0)
Platelets: 192 10*3/uL (ref 150–400)
RBC: 2.8 MIL/uL — ABNORMAL LOW (ref 3.87–5.11)
RDW: 20.1 % — ABNORMAL HIGH (ref 11.5–15.5)
WBC: 6.1 10*3/uL (ref 4.0–10.5)
nRBC: 1.6 % — ABNORMAL HIGH (ref 0.0–0.2)

## 2023-06-13 LAB — MAGNESIUM: Magnesium: 0.6 mg/dL — CL (ref 1.7–2.4)

## 2023-06-13 LAB — LIPASE, BLOOD: Lipase: 66 U/L — ABNORMAL HIGH (ref 11–51)

## 2023-06-13 LAB — ETHANOL: Alcohol, Ethyl (B): <10 mg/dL (ref <10)

## 2023-06-13 MED ORDER — DEXTROSE 50 % IV SOLN
1.0000 | Freq: Once | INTRAVENOUS | Status: AC
  Administered 2023-06-13: 50 mL via INTRAVENOUS
  Filled 2023-06-13: qty 50

## 2023-06-13 MED ORDER — SODIUM CHLORIDE 0.9 % IV SOLN
750.0000 mg | Freq: Two times a day (BID) | INTRAVENOUS | Status: DC
  Administered 2023-06-14 – 2023-06-17 (×8): 750 mg via INTRAVENOUS
  Filled 2023-06-13 (×13): qty 7.5

## 2023-06-13 MED ORDER — SODIUM CHLORIDE 0.9 % IV BOLUS
1000.0000 mL | Freq: Once | INTRAVENOUS | Status: AC
Start: 2023-06-13 — End: 2023-06-14
  Administered 2023-06-13: 1000 mL via INTRAVENOUS

## 2023-06-13 MED ORDER — FOLIC ACID 1 MG PO TABS
1.0000 mg | ORAL_TABLET | Freq: Every day | ORAL | Status: DC
  Administered 2023-06-14 – 2023-06-22 (×9): 1 mg via ORAL
  Filled 2023-06-13 (×9): qty 1

## 2023-06-13 MED ORDER — MAGNESIUM SULFATE 2 GM/50ML IV SOLN: 2.0000 g | Freq: Once | INTRAVENOUS | Status: DC

## 2023-06-13 MED ORDER — CALCIUM GLUCONATE-NACL 2-0.675 GM/100ML-% IV SOLN
2.0000 g | Freq: Once | INTRAVENOUS | Status: AC
  Administered 2023-06-13: 2000 mg via INTRAVENOUS
  Filled 2023-06-13: qty 100

## 2023-06-13 MED ORDER — LORAZEPAM 1 MG PO TABS
1.0000 mg | ORAL_TABLET | ORAL | Status: AC | PRN
  Administered 2023-06-15: 1 mg via ORAL
  Filled 2023-06-13: qty 1

## 2023-06-13 MED ORDER — THIAMINE HCL 100 MG/ML IJ SOLN
100.0000 mg | Freq: Every day | INTRAMUSCULAR | Status: DC
  Administered 2023-06-13 – 2023-06-15 (×2): 100 mg via INTRAVENOUS
  Filled 2023-06-13 (×3): qty 2

## 2023-06-13 MED ORDER — ENOXAPARIN SODIUM 40 MG/0.4ML IJ SOSY
40.0000 mg | PREFILLED_SYRINGE | INTRAMUSCULAR | Status: DC
  Administered 2023-06-13 – 2023-06-19 (×7): 40 mg via SUBCUTANEOUS
  Filled 2023-06-13 (×7): qty 0.4

## 2023-06-13 MED ORDER — LORAZEPAM 2 MG/ML IJ SOLN
1.0000 mg | INTRAMUSCULAR | Status: AC | PRN
  Administered 2023-06-13 – 2023-06-16 (×5): 2 mg via INTRAVENOUS
  Filled 2023-06-13 (×5): qty 1

## 2023-06-13 MED ORDER — ADULT MULTIVITAMIN W/MINERALS CH
1.0000 | ORAL_TABLET | Freq: Every day | ORAL | Status: DC
  Administered 2023-06-14 – 2023-06-20 (×7): 1 via ORAL
  Filled 2023-06-13 (×9): qty 1

## 2023-06-13 MED ORDER — PANTOPRAZOLE SODIUM 40 MG IV SOLR
40.0000 mg | Freq: Two times a day (BID) | INTRAVENOUS | Status: DC
  Administered 2023-06-14 – 2023-06-17 (×8): 40 mg via INTRAVENOUS
  Filled 2023-06-13 (×8): qty 10

## 2023-06-13 MED ORDER — MAGNESIUM SULFATE 4 GM/100ML IV SOLN
4.0000 g | Freq: Once | INTRAVENOUS | Status: AC
  Administered 2023-06-13: 4 g via INTRAVENOUS
  Filled 2023-06-13: qty 100

## 2023-06-13 MED ORDER — THIAMINE MONONITRATE 100 MG PO TABS
100.0000 mg | ORAL_TABLET | Freq: Every day | ORAL | Status: DC
  Administered 2023-06-14 – 2023-06-22 (×8): 100 mg via ORAL
  Filled 2023-06-13 (×9): qty 1

## 2023-06-13 NOTE — Telephone Encounter (Signed)
 I called pt who stated she's on her way to the ER.

## 2023-06-13 NOTE — TOC CM/SW Note (Addendum)
 SW attached substance abuse resources to patient's AVS.   SW met with patient at bedside, patient is from home with husband. Patient has not been able to eat, keep food down for 3 days. Patient has also been having trouble swallowing her pills. Patient hands, feet are swollen, she admitted to heavy alcoholism drinking every day including a 7oz can of beer and shot of liquor. Patient has history withdraw seizures, chronic issues with her hemoglobin.   Patient has not been able to ambulate in 3 months she stated.   .Kayron Kalmar, MSW, LCSWA Transition of Care  Clinical Social Worker (ED 3-11 Mon-Fri)  [phone]

## 2023-06-13 NOTE — ED Provider Triage Note (Signed)
 Emergency Medicine Provider Triage Evaluation Note   Christina Byrd , a 58 y.o. female  was evaluated in triage.  Pt complains of difficulty swallowing, off balance, nausea and vomiting.  Review of Systems  Positive: Difficulty swallowing Negative: Chest pain, dyspnea  Physical Exam  BP 100/80   Pulse (!) 119   Temp 97.9 F (36.6 C)   Resp 18   LMP 08/23/2011   SpO2 100%  Gen:   Awake, no distress   Resp:  Normal effort  MSK:   Moves extremities without difficulty  Other:  Tachycardia noted  Medical Decision Making  Medically screening exam initiated at 2:40 PM.  Appropriate orders placed.  Christina Byrd was informed that the remainder of the evaluation will be completed by another provider, this initial triage assessment does not replace that evaluation, and the importance of remaining in the ED until their evaluation is complete.  Orders placed for labs, EKG, cxr   Auston Blush, MD 06/13/23 1447

## 2023-06-13 NOTE — H&P (Addendum)
 Date: 06/13/2023               Patient Name:  Christina Byrd MRN: [id number]  DOB: 1965/12/29 Age / Sex: 58 y.o., female   PCP: Carleen Chary, DO         Medical Service: Internal Medicine Teaching Service         Attending Physician: Dr. 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 She also endorses having very little PO intake starting three days ago. She denies any changes in her day to day, and she does endorse continued alcohol use with most recently yesterday with a 7 ounce of beer and shot of liquor. She has been able to swallow liquids however, with her last solid food intake was three days ago.  She denies any recent fevers, chills, abdominal pain, shortness of breath.     Meds:  Allopurinol  100mg   Atorvastatin  20mg   Colchicine  .6mg   Cymbalta  30mg   Gabapentin  100mg  TID  Keppra  750mg  BID  Protonix  40mg   Compazine  10mg    Past Medical History  Past Surgical History:  Procedure Laterality Date   BIOPSY  06/03/2022   Procedure: BIOPSY;  Surgeon: Elois Hair, MD;  Location: [facility] ENDOSCOPY;  Service: Gastroenterology;;   ESOPHAGOGASTRODUODENOSCOPY N/A 12/29/2022   Procedure: ESOPHAGOGASTRODUODENOSCOPY (EGD);  Surgeon: Nandigam, Kavitha V, MD;  Location: [facility] ENDOSCOPY;  Service: Gastroenterology;  Laterality: N/A;   ESOPHAGOGASTRODUODENOSCOPY (EGD) WITH PROPOFOL  N/A 06/03/2022   Procedure: ESOPHAGOGASTRODUODENOSCOPY (EGD) WITH PROPOFOL ;  Surgeon: Elois Hair, MD;  Location: [facility] ENDOSCOPY;  Service: Gastroenterology;  Laterality: N/A;   HOT HEMOSTASIS N/A 06/03/2022   Procedure: HOT HEMOSTASIS (ARGON PLASMA COAGULATION/BICAP);  Surgeon: Elois Hair, MD;  Location: [facility] ENDOSCOPY;  Service: Gastroenterology;  Laterality: N/A;    Social:  Lives With: Her husband  Occupation: Currently unemployed  Support: Husband at home Level of Function: Able to perform all ADLs/IADLs when not acutely ill  PCP: Dr. Carleen Chary, DO  Substances: 49 standard drinks of alcohol per week history  Family History: Unable to identify family history  Allergies: Allergies as of 06/13/2023 - Review Complete 06/13/2023  Allergen Reaction Noted   Losartan  potassium  12/20/2012   Levaquin  [levofloxacin  in d5w] Rash 02/06/2013   Ace inhibitors Cough 01/30/2008   Codeine  Nausea Only 05/26/2015   Cefepime  Rash 09/05/2012   Chlorhexidine Itching and Rash 07/09/2022   Vancomycin  Rash 09/05/2012    Review of Systems: A complete ROS was negative except as per HPI.   OBJECTIVE:   Physical Exam: Blood pressure 99/81, pulse (!) 114, temperature 98 F (36.7 C), resp. rate 15, last menstrual period 08/23/2011, SpO2 100%.  Constitutional:Ill-appearing female, in no acute distress HENT: normocephalic atraumatic, mucous membranes dry Eyes: conjunctiva non-erythematous Neck: supple Cardiovascular:tachycardic and  normal rhythm, no m/r/g Pulmonary/Chest: normal work of breathing on room air, lungs clear to auscultation bilaterally Abdominal: soft, non-tender, non-distended MSK: normal bulk and tone Neurological: alert &  oriented x 3, 5/5 strength in bilateral upper and lower extremities, normal gait Skin: warm and dry Psych: Normal mood and affect  Labs: CBC    Component Value Date/Time   WBC 6.1 06/13/2023 1518   RBC 2.80 (L) 06/13/2023 1518   HGB 8.6 (L) 06/13/2023 1518   HGB 12.4 04/27/2022 1125   HCT 25.6 (L) 06/13/2023 1518   HCT 37.6  04/27/2022 1125   PLT 192 06/13/2023 1518   PLT 210 04/27/2022 1125   MCV 91.4 06/13/2023 1518   MCV 97 04/27/2022 1125   MCH 30.7 06/13/2023 1518   MCHC 33.6 06/13/2023 1518   RDW 20.1 (H) 06/13/2023 1518   RDW 17.0 (H) 04/27/2022 1125   LYMPHSABS 2.1 02/06/2023 1958   LYMPHSABS 1.5 04/27/2022 1125   MONOABS 0.6 02/06/2023 1958   EOSABS 0.1 02/06/2023 1958   EOSABS 0.0 04/27/2022 1125   BASOSABS 0.0 02/06/2023 1958   BASOSABS 0.0 04/27/2022 1125     CMP     Component Value Date/Time   NA 134 (L) 06/13/2023 1518   NA 144 04/27/2022 1125   K 4.5 06/13/2023 1518   CL 101 06/13/2023 1518   CO2 13 (L) 06/13/2023 1518   GLUCOSE 69 (L) 06/13/2023 1518   BUN 9 06/13/2023 1518   BUN 7 04/27/2022 1125   CREATININE 1.40 (H) 06/13/2023 1518   CREATININE 1.00 03/21/2014 1556   CALCIUM  5.2 (LL) 06/13/2023 1518   PROT 6.2 (L) 06/13/2023 1518   PROT 7.1 04/27/2022 1125   ALBUMIN 2.2 (L) 06/13/2023 1518   ALBUMIN 3.5 (L) 04/27/2022 1125   AST 76 (H) 06/13/2023 1518   ALT 38 06/13/2023 1518   ALKPHOS 164 (H) 06/13/2023 1518   BILITOT 0.6 06/13/2023 1518   BILITOT 0.6 04/27/2022 1125   GFRNONAA 44 (L) 06/13/2023 1518   GFRNONAA >89 12/11/2013 1214   GFRAA 61 01/02/2020 1100   GFRAA >89 12/11/2013 1214    Imaging: Chest X-Ray Pending  EKG: personally reviewed my interpretation is sinus tachycardia, similar to previous EKG   ASSESSMENT & PLAN:   Assessment & Plan by Problem: Principal Problem:   Hypocalcemia Active Problems:   Protein-calorie malnutrition, severe (HCC)   AKI (acute kidney injury) (HCC)   Christina Byrd is a 58 y.o. person living with a history of alcohol use disorder, chronic dysphagia who presented with failure to thrive and admitted for failure to thrive on hospital day 0  #Hypocalcemia  #AKI #AGMA  All likely in the setting of decreased PO intake within the last few days. She does have chronic dysphagia, which has been evaluated multiple times.  She was supposed to follow up with GI outpatient however she unfortunately missed her appointment. Will replace electrolytes, starting with calcium , currently at 5.2 corrected at 6.6. Creatinine at 1.4, above baseline .88. Will give fluids, replete calcium . AGMA likely in the setting of starvation ketosis.   Plan:  - Calcium  gluconate 2g being given  - 1000mL NaCl Bolus  - Q6Hr RFP  - Replenish electrolytes as needed  #Hx of Alcohol Use Disorder  #Hx of Alcohol Withdrawal Seizures Last drink was yesterday morning with a shot of liquor and a 7 ounce beer.  She does have a history of alcohol withdrawal seizures, for which she takes Keppra  750 mg twice a day.  Given that she is unable to swallow these pills at this time, will start IV Keppra  750, as well as CIWA with Ativan .  Plan: - IV Keppra  750 mg -  CIWA with Ativan   #Chronic Dysphagia Seems to have had extensive workup for this in the past, including GI endoscopy in November 2020 for which was unrevealing and recommendations were for proton pump inhibitor and alcohol cessation, with outpatient GI follow-up.  Unfortunately she was unable to make that appointment.  Will keep her n.p.o. for now, place a speech eval.  Difficult to say what is causing this acute worsening, could be continued alcohol use.  Plan: - IV PPI - Consider GI consult if SLP evaluation unrevealing  #Normocytic anemia Chronic issue for her, with hemoglobin of 8.6.  At this time we will continue to monitor   # Chronic gout Unfortunately she is unable to take p.o. intake at this time, will hold her allopurinol  and colchicine .  Diet: NPO VTE: Enoxaparin  IVF: Bolus of NS Code: Full  Prior to Admission Living Arrangement: Home, living with husband Anticipated Discharge Location: Home Barriers to Discharge: Medical Management  Dispo: Admit patient to Inpatient with expected length of stay greater than 2 midnights.  Signed: Alegria Dominique, MD Internal Medicine  Resident PGY-2  06/13/2023, 6:16 PM   Dr. Chrissie Coupe, MD Pager [phone]

## 2023-06-13 NOTE — Telephone Encounter (Signed)
 Copied from CRM [id number]. Topic: Clinical - Red Word Triage >> Jun 13, 2023 10:58 AM Tiffany H wrote: [facility] that prompted transfer to Nurse Triage: Patient and husband called to advise that patient is in significant pain. Patient was originaly scheduled to peak with Aurora Lees today at 2:15PM. Patient expressed desire to go to the hospital. She advised that her legs, arms and feet have been hurting for the past few months and progressively getting worse. She's having trouble eating and passing urine. Patient's feet are also swollen.   Please assist.  Chief Complaint: has not urinated in 2-3 days; states she's in pain everywhere Symptoms: na Frequency: na Pertinent Negatives: Patient denies na Disposition: [x] ED /[] Urgent Care (no appt availability in office) / [] Appointment(In office/virtual)/ []  [place] [facility]/ [] Home Care/ [] Refused Recommended Disposition /[] [place] [facility]/ []  Follow-up with PCP Additional Notes: instructed to go to the ER; pcp office updated.   Reason for Disposition  Sounds like a life-threatening emergency to the triager  Answer Assessment - Initial Assessment Questions 1. SYMPTOM: "What's the main symptom you're concerned about?" (e.g., frequency, incontinence)     Hasn't peed in 2 - 3 days. To ER  Protocols used: Urinary Symptoms-A-AH

## 2023-06-13 NOTE — Hospital Course (Addendum)
#  Chronic Dysphagia  Prior to admission, underwent extensive workup in the past. GI endoscopy in November 2020 was unremarkable and recommended PPI and alcohol cessation with outpatient GI follow-up. She was unable to make this appointment. Initially remained NPO with a speech evaluation. EGD in April 2024 demonstrated no esophageal abnormality but noted to have had a cratered duodenal ulcer. GI consulted with concerns for worsening dysphagia. Barium swallow yesterday demonstrated normal swallowing, normal appearance of the esophagus, poor primary peristaltic wave with incomplete esophageal emptying, small hiatal hernia, and evidence of spontaneous GERD with regurgitation.  GI signed off. Re-evaluated by speech who signed off as her swallowing difficulties fall within the realm of the esophagus.  Advancement of diet was well tolerated. Remained tachycardic but thought to have been from hypovolemia rather than dehydration so given some fluids.   #Aspiration pneumonia #Tachycardia Etiology initially unclear.  Thought to have been secondary to hypovolemia from poor p.o. intake from chronic diarrhea; however, she was been given multiple boluses and tachycardia, and this did not resolve.There was some concern for shortness of breath, as well as expiratory wheezes, so chest x-ray was obtained which showed bilateral atelectasis; however could not rule out left lower lobe pneumonia. She was very high risk for aspiration given her chronic dysphagia, and the timeline matched up with aspiration pneumonia as well. WBC was also elevated at 13.2, so she was started on a 7-day course of antibiotics. Transitioned from Zosyn to Augmentin  at discharge.   #Hypocalcemia #AKI #NAGMA Suspected to have been in the context of decreased PO intake in days prior to admission likely exacerbated by her chronic dysphagia. Initial corrected calcium  was 6.6 with creatinine of 1.4 (baseline ~0.9). Given fluids with electrolyte  replenishment. NAGMA likely in setting of starvation of diarrhea.  Follow-up renal function panel outpatient.  #Hx of Alcohol Use Disorder #Hx of Alcohol Withdrawal Seizures Last drink was prior morning of admission. History of withdrawal seizures in which she takes Keppra  750 mg BID. As she was unable to swallow pills, she was transitioned to IV Keppra  and CIWA with Ativan . Her CIWA was intermittently high with episodes of tachycardia requiring Ativan . Stable at discharge and recommend continued encourage of alcohol cessation.   #Normocytic Anemia  Hemoglobin on admission was 8.6 with MCV of 91.4. Hemoglobin dropped to 6.7 requiring unit of blood. Remained stable around 9 throughout the rest of her hospitalization.  Hemoglobin again dropped to 6.7 requiring an additional unit of blood.  GI was reconsulted and completed an EGD and colonoscopy, which were largely unremarkable.  They collected samples with concerns for microscopic colitis.  Follow-up outpatient.  #Chronic Gout  Held home allopurinol  and colchicine  given poor PO intake.  Follow-up outpatient

## 2023-06-13 NOTE — ED Triage Notes (Signed)
 PT states sent from PCP for evaluation of multiple complaints. Pt states unable to swallow food or pills without gagging x 3 days. Hasn't had prescribed medication since yesterday Pt states only urinated 1 time in past 3 days, difficulty standing and walking. For several months Hands and feet have been  numb and tingling

## 2023-06-13 NOTE — Progress Notes (Incomplete)
 Internal Medicine Teaching Service Attending Note Date: 06/13/2023  Patient name: Christina Byrd  Medical record number: [id number]  Date of birth: Jun 03, 1965   I have seen and evaluated Christina Byrd and discussed their care with the Residency Team.   58 yo F with hx of ETOH abuse, seizures, comes to hospital with worsening dysphagia over the past 3 days. She has been unable to swallow her medications, and has not been able to eat solid food. She has had a feeling that food gets stuck in her throat and she then has n/v. She tried applesauce but this also was unsuccessful. She has been able to take liquids.  Ensure has stayed down 50% of the time.  Has lost 90# in the last 6 months.  Last seizure ~ 1 year ago Last drink was one 7 oz beer and 1 shot of hard liquor yesterday.  In ED she was found to have Ca 5.2  Physical Exam: Blood pressure 99/81, pulse (!) 114, temperature 98 F (36.7 C), resp. rate 15, last menstrual period 08/23/2011, SpO2 100%. General appearance: alert, cooperative, and no distress Eyes: negative findings: conjunctivae and sclerae normal and pupils equal, round, reactive to light and accomodation Throat: normal findings: oropharynx pink & moist without lesions or evidence of thrush Neck: no adenopathy, no JVD, and supple, symmetrical, trachea midline Resp: clear to auscultation bilaterally Cardio: regularly irregular rhythm GI: normal findings: bowel sounds normal and soft, non-tender Extremities: edema 3+ BLE  Lab results: Results for orders placed or performed during the hospital encounter of 06/13/23 (from the past 24 hours)  CBC     Status: Abnormal   Collection Time: 06/13/23  3:18 PM  Result Value Ref Range   WBC 6.1 4.0 - 10.5 K/uL   RBC 2.80 (L) 3.87 - 5.11 MIL/uL   Hemoglobin 8.6 (L) 12.0 - 15.0 g/dL   HCT 32.3 (L) 55.7 - 32.2 %   MCV 91.4 80.0 - 100.0 fL   MCH 30.7 26.0 - 34.0 pg   MCHC 33.6 30.0 - 36.0 g/dL   RDW 02.5 (H) 42.7 - 06.2 %    Platelets 192 150 - 400 K/uL   nRBC 1.6 (H) 0.0 - 0.2 %  Comprehensive metabolic panel     Status: Abnormal   Collection Time: 06/13/23  3:18 PM  Result Value Ref Range   Sodium 134 (L) 135 - 145 mmol/L   Potassium 4.5 3.5 - 5.1 mmol/L   Chloride 101 98 - 111 mmol/L   CO2 13 (L) 22 - 32 mmol/L   Glucose, Bld 69 (L) 70 - 99 mg/dL   BUN 9 6 - 20 mg/dL   Creatinine, Ser 3.76 (H) 0.44 - 1.00 mg/dL   Calcium  5.2 (LL) 8.9 - 10.3 mg/dL   Total Protein 6.2 (L) 6.5 - 8.1 g/dL   Albumin 2.2 (L) 3.5 - 5.0 g/dL   AST 76 (H) 15 - 41 U/L   ALT 38 0 - 44 U/L   Alkaline Phosphatase 164 (H) 38 - 126 U/L   Total Bilirubin 0.6 0.0 - 1.2 mg/dL   GFR, Estimated 44 (L) >60 mL/min   Anion gap 20 (H) 5 - 15  Lipase, blood     Status: Abnormal   Collection Time: 06/13/23  3:18 PM  Result Value Ref Range   Lipase 66 (H) 11 - 51 U/L    Imaging results:  No results found.  Assessment and Plan: I agree with the formulated Assessment and Plan with the  following changes:  Hypocalcemia AKI Anemia, chronic Hepatitis Severe protein calorie nutrition  Will supplement her Ca IV.  Her ECG does not show prolongation of QRS, nor ST segment elevation. Have her seen by nutrition Barium swallow.  Speech, path.    Sandie Cross, MD

## 2023-06-13 NOTE — ED Provider Notes (Signed)
 [place] EMERGENCY DEPARTMENT AT [facility] Provider Note   CSN: [id number] Arrival date & time: 06/13/23  1218     History  Chief Complaint  Patient presents with   Dysphagia   Weakness   Urinary Retention    Christina Byrd is a 58 y.o. female w/ hx of alcoholic neuropathy, failure to thrive, electrolyte derangement, presented to ED with generalized weakness, poor oral intake ongoing for at least 3 days.  Patient reports that she has a hard time swallowing her pills, though she is able to drink water , but not drinking much water .  She says she feels weak, dehydrated.  She says she has not numbness and tingling in her hands and feet.  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  HPI     Home Medications Prior to Admission medications   Medication Sig Start Date End Date Taking? Authorizing Provider  allopurinol  (ZYLOPRIM ) 100 MG tablet Take 2 tablets (200 mg total) by mouth daily. 04/29/23   Carleen Chary, DO  atorvastatin  (LIPITOR) 20 MG tablet Take 1 tablet (20 mg total) by mouth daily. 05/03/23   Carleen Chary, DO  cetirizine  (ZYRTEC  ALLERGY) 10 MG tablet Take 1 tablet (10 mg total) by mouth daily. 05/26/23 05/25/24  Carleen Chary, DO  colchicine  0.6 MG tablet Take 1 tablet (0.6 mg total) by mouth daily. Patient not taking: Reported on 01/24/2023 01/12/23   Atway, Rayann N, DO  DULoxetine  (CYMBALTA ) 30 MG capsule Take 1 capsule (30 mg total) by mouth daily. 05/26/23 05/25/24  Carleen Chary, DO  ergocalciferol  (VITAMIN D2) 1.25 MG (50000 UT) capsule Take 1 capsule by mouth once a week. Every Wednesday starting 11/13 04/29/23   Carleen Chary, DO  etodolac  (LODINE ) 400 MG tablet Take 1 tablet (400 mg total) by mouth 2 (two) times daily. 04/29/23   Carleen Chary, DO  feeding supplement (ENSURE ENLIVE /  ENSURE PLUS) LIQD Take 237 mLs by mouth 3 (three) times daily between meals. Patient not taking: Reported on 01/24/2023 12/30/22   Jackolyn Masker, MD  fluticasone  (FLONASE ) 50 MCG/ACT nasal spray Use one spray in each nostril daily 05/27/23   Carleen Chary, DO  folic acid  (FOLVITE ) 1 MG tablet Take 1 tablet (1 mg total) by mouth daily. 05/03/23   Carleen Chary, DO  gabapentin  (NEURONTIN ) 100 MG capsule Take 1 capsule (100 mg total) by mouth 3 (three) times daily. 06/06/23 06/05/24  Aurora Lees, DO  hydrocortisone  cream 1 % Apply 1 Application topically 2 (two) times daily. 01/05/23   Masters, Katie, DO  levETIRAcetam  (KEPPRA ) 750 MG tablet Take 1 tablet (750 mg total) by mouth 2 (two) times daily. 05/03/23   Carleen Chary, DO  Multiple Vitamin (MULTIVITAMIN WITH MINERALS) TABS tablet Take 1 tablet by mouth daily. 05/03/23   Carleen Chary, DO  Nystatin (GERHARDT'S BUTT CREAM) CREA Apply 1 Application topically 4 (four) times daily -  before meals and at bedtime. Patient not taking: Reported on 01/24/2023 12/30/22   Jackolyn Masker, MD  ondansetron  (ZOFRAN ) 4 MG tablet Take 1 tablet (4 mg total) by mouth every 8 (eight) hours as needed for nausea or vomiting. 05/03/23   Carleen Chary, DO  pantoprazole  (PROTONIX ) 40 MG tablet Take 1 tablet (40 mg total) by mouth 2 (two) times daily. 05/03/23   Carleen Chary, DO  potassium chloride  SA (KLOR-CON  M) 20 MEQ tablet Take  1 tablet (20 mEq total) by mouth 2 (two) times daily for 14 days. 05/27/23 06/10/23  Carleen Chary, DO  prochlorperazine  (COMPAZINE ) 10 MG tablet Take 1 tablet (10 mg total) by mouth every 6 (six) hours as needed for nausea or vomiting. 05/26/23   Carleen Chary, DO  thiamine  (VITAMIN B1) 100 MG tablet Take 1 tablet (100 mg total) by mouth daily. 05/03/23   Carleen Chary, DO  Zinc  Oxide (TRIPLE PASTE) 12.8 % ointment Apply topically as needed for irritation. In your buttocks 12/30/22   Jackolyn Masker, MD  metFORMIN   (GLUCOPHAGE ) 500 MG tablet Take 0.5 tablets (250 mg total) by mouth daily with breakfast. 03/05/20 04/02/20  Hassie Lint, PA-C      Allergies    Losartan  potassium, Levaquin  [levofloxacin  in d5w], Ace inhibitors, Codeine , Cefepime , Chlorhexidine, and Vancomycin     Review of Systems   Review of Systems  Physical Exam Updated Vital Signs BP (!) 114/91   Pulse (!) 110   Temp 98 F (36.7 C)   Resp 13   LMP 08/23/2011   SpO2 100%  Physical Exam Constitutional:      General: She is not in acute distress. HENT:     Head: Normocephalic and atraumatic.     Comments: Tacky mucous membranes Eyes:     Conjunctiva/sclera: Conjunctivae normal.     Pupils: Pupils are equal, round, and reactive to light.  Cardiovascular:     Rate and Rhythm: Regular rhythm. Tachycardia present.  Pulmonary:     Effort: Pulmonary effort is normal. No respiratory distress.  Abdominal:     General: There is no distension.     Tenderness: There is no abdominal tenderness.  Skin:    General: Skin is warm and dry.  Neurological:     General: No focal deficit present.     Mental Status: She is alert. Mental status is at baseline.     Comments: Paresthesias of the hands and feet     ED Results / Procedures / Treatments   Labs (all labs ordered are listed, but only abnormal results are displayed) Labs Reviewed  CBC - Abnormal; Notable for the following components:      Result Value   RBC 2.80 (*)    Hemoglobin 8.6 (*)    HCT 25.6 (*)    RDW 20.1 (*)    nRBC 1.6 (*)    All other components within normal limits  COMPREHENSIVE METABOLIC PANEL WITH GFR - Abnormal; Notable for the following components:   Sodium 134 (*)    CO2 13 (*)    Glucose, Bld 69 (*)    Creatinine, Ser 1.40 (*)    Calcium  5.2 (*)    Total Protein 6.2 (*)    Albumin 2.2 (*)    AST 76 (*)    Alkaline Phosphatase 164 (*)    GFR, Estimated 44 (*)    Anion gap 20 (*)    All other components within normal limits  LIPASE, BLOOD  - Abnormal; Notable for the following components:   Lipase 66 (*)    All other components within normal limits  MAGNESIUM  - Abnormal; Notable for the following components:   Magnesium  0.6 (*)    All other components within normal limits  ETHANOL  URINALYSIS, ROUTINE W REFLEX MICROSCOPIC  CBC  RENAL FUNCTION PANEL  RENAL FUNCTION PANEL  MAGNESIUM     EKG EKG Interpretation Date/Time:  Monday June 13 2023 15:19:24 EDT Ventricular Rate:  120 PR Interval:  138 QRS Duration:  62 QT Interval:  324 QTC Calculation: 457 R Axis:   96  Text Interpretation: Sinus tachycardia Rightward axis Low voltage QRS T wave abnormality, consider inferior ischemia T wave abnormality, consider anterolateral ischemia Abnormal ECG When compared with ECG of 23-Jan-2023 18:41, PREVIOUS ECG IS PRESENT Confirmed by Jerald Molly [phone]) on 06/13/2023 5:04:32 PM  Radiology DG Chest Port 1 View Result Date: 06/13/2023 CLINICAL DATA:  Dysphagia EXAM: PORTABLE CHEST 1 VIEW COMPARISON:  X-ray 12/23/2022 and older FINDINGS: Poor inflation. There is some linear opacity lung bases, right-greater-than-left. Atelectasis is favored. No consolidation, pneumothorax, effusion or edema. Normal cardiopericardial silhouette with tortuous ectatic aorta. Osteopenia. Degenerative changes. IMPRESSION: Underinflation with basilar atelectasis, right-greater-than-left. Electronically Signed   By: Adrianna Horde M.D.   On: 06/13/2023 18:11    Procedures .Critical Care  Performed by: Arvilla Birmingham, MD Authorized by: Arvilla Birmingham, MD   Critical care provider statement:    Critical care time (minutes):  30   Critical care time was exclusive of:  Separately billable procedures and treating other patients   Critical care was necessary to treat or prevent imminent or life-threatening deterioration of the following conditions:  Metabolic crisis   Critical care was time spent personally by me on the following activities:  Ordering  and performing treatments and interventions, ordering and review of laboratory studies, ordering and review of radiographic studies, pulse oximetry, review of old charts, examination of patient and evaluation of patient's response to treatment   Care discussed with: admitting provider   Comments:     Hypocalcemia management     Medications Ordered in ED Medications  sodium chloride  0.9 % bolus 1,000 mL (has no administration in time range)  calcium  gluconate 2 g/ 100 mL sodium chloride  IVPB (has no administration in time range)  enoxaparin  (LOVENOX ) injection 40 mg (has no administration in time range)  LORazepam  (ATIVAN ) tablet 1-4 mg (has no administration in time range)    Or  LORazepam  (ATIVAN ) injection 1-4 mg (has no administration in time range)  thiamine  (VITAMIN B1) tablet 100 mg (has no administration in time range)    Or  thiamine  (VITAMIN B1) injection 100 mg (has no administration in time range)  folic acid  (FOLVITE ) tablet 1 mg (1 mg Oral Not Given 06/13/23 2015)  multivitamin with minerals tablet 1 tablet (1 tablet Oral Not Given 06/13/23 2014)  levETIRAcetam  (KEPPRA ) 750 mg in sodium chloride  0.9 % 100 mL IVPB (has no administration in time range)  pantoprazole  (PROTONIX ) injection 40 mg (has no administration in time range)  magnesium  sulfate IVPB 4 g 100 mL (has no administration in time range)  dextrose  50 % solution 50 mL (50 mLs Intravenous Given 06/13/23 1830)    ED Course/ Medical Decision Making/ A&P                                 Medical Decision Making Amount and/or Complexity of Data Reviewed Labs: ordered.  Risk Prescription drug management. Decision regarding hospitalization.   This patient presents to the ED with concern for dysphagia, fatigue, weakness, neuropathy. This involves an extensive number of treatment options, and is a complaint that carries with it a high risk of complications and morbidity.  The differential diagnosis includes  dehydration likely related to poor oral intake, and likely exacerbation of chronic alcoholic neuropathy of the hands and feet.  Electrolyte derangement also in differential as well as anemia  Co-morbidities that  complicate the patient evaluation: History of electrolyte derangement, alcohol use syndrome  External records from outside source obtained and reviewed including outpatient PCP records from internal medicine service  I ordered and personally interpreted labs.  The pertinent results include: Hypocalcemia, hypoalbuminemia, chronic anemia with hemoglobin 8.6.  Lipase 66.  Anion gap 20.  I ordered imaging studies including x-ray of the chest I independently visualized and interpreted imaging which showed suspected atelectasis I agree with the radiologist interpretation  The patient was maintained on a cardiac monitor.  I personally viewed and interpreted the cardiac monitored which showed an underlying rhythm of: Sinus tachycardia  Per my interpretation the patient's ECG shows sinus tachycardia  I ordered medication including IV fluid for hydration and anion gap elevation, IV calcium  for hypocalcemia  I have reviewed the patients home medicines and have made adjustments as needed  Test Considered: low suspicion for sepsis, acute PE, stroke  After the interventions noted above, I reevaluated the patient and found that they have: stayed the same  Dispostion:  After consideration of the diagnostic results and the patients response to treatment, I feel that the patent would benefit from medical admission         Final Clinical Impression(s) / ED Diagnoses Final diagnoses:  Dehydration  Hypocalcemia  Hypoalbuminemia  Dysphagia, unspecified type    Rx / DC Orders ED Discharge Orders     None         Zyaira Vejar, Janalyn Me, MD 06/13/23 2025

## 2023-06-14 DIAGNOSIS — E46 Unspecified protein-calorie malnutrition: Secondary | ICD-10-CM | POA: Diagnosis not present

## 2023-06-14 DIAGNOSIS — F1010 Alcohol abuse, uncomplicated: Secondary | ICD-10-CM | POA: Diagnosis not present

## 2023-06-14 DIAGNOSIS — R1310 Dysphagia, unspecified: Secondary | ICD-10-CM | POA: Diagnosis not present

## 2023-06-14 LAB — URINALYSIS, ROUTINE W REFLEX MICROSCOPIC
Bilirubin Urine: NEGATIVE
Glucose, UA: NEGATIVE mg/dL
Hgb urine dipstick: NEGATIVE
Ketones, ur: NEGATIVE mg/dL
Nitrite: NEGATIVE
Protein, ur: NEGATIVE mg/dL
Specific Gravity, Urine: 1.009 (ref 1.005–1.030)
pH: 5 (ref 5.0–8.0)

## 2023-06-14 LAB — RENAL FUNCTION PANEL
Albumin: 1.7 g/dL — ABNORMAL LOW (ref 3.5–5.0)
Albumin: 1.7 g/dL — ABNORMAL LOW (ref 3.5–5.0)
Albumin: 1.8 g/dL — ABNORMAL LOW (ref 3.5–5.0)
Anion gap: 11 (ref 5–15)
Anion gap: 13 (ref 5–15)
Anion gap: 13 (ref 5–15)
BUN: 13 mg/dL (ref 6–20)
BUN: 12 mg/dL (ref 6–20)
BUN: 14 mg/dL (ref 6–20)
CO2: 18 mmol/L — ABNORMAL LOW (ref 22–32)
CO2: 19 mmol/L — ABNORMAL LOW (ref 22–32)
CO2: 18 mmol/L — ABNORMAL LOW (ref 22–32)
Calcium: 5.5 mg/dL — CL (ref 8.9–10.3)
Calcium: 6.3 mg/dL — CL (ref 8.9–10.3)
Calcium: 6.3 mg/dL — CL (ref 8.9–10.3)
Chloride: 107 mmol/L (ref 98–111)
Chloride: 106 mmol/L (ref 98–111)
Chloride: 108 mmol/L (ref 98–111)
Creatinine, Ser: 1.25 mg/dL — ABNORMAL HIGH (ref 0.44–1.00)
Creatinine, Ser: 1.34 mg/dL — ABNORMAL HIGH (ref 0.44–1.00)
Creatinine, Ser: 1.27 mg/dL — ABNORMAL HIGH (ref 0.44–1.00)
GFR, Estimated: 50 mL/min — ABNORMAL LOW (ref >60)
GFR, Estimated: 46 mL/min — ABNORMAL LOW (ref >60)
GFR, Estimated: 49 mL/min — ABNORMAL LOW (ref >60)
Glucose, Bld: 129 mg/dL — ABNORMAL HIGH (ref 70–99)
Glucose, Bld: 81 mg/dL (ref 70–99)
Glucose, Bld: 84 mg/dL (ref 70–99)
Phosphorus: 3.9 mg/dL (ref 2.5–4.6)
Phosphorus: 4 mg/dL (ref 2.5–4.6)
Phosphorus: 3.6 mg/dL (ref 2.5–4.6)
Potassium: 3 mmol/L — ABNORMAL LOW (ref 3.5–5.1)
Potassium: 3.4 mmol/L — ABNORMAL LOW (ref 3.5–5.1)
Potassium: 3.5 mmol/L (ref 3.5–5.1)
Sodium: 136 mmol/L (ref 135–145)
Sodium: 138 mmol/L (ref 135–145)
Sodium: 139 mmol/L (ref 135–145)

## 2023-06-14 LAB — MAGNESIUM: Magnesium: 1.9 mg/dL (ref 1.7–2.4)

## 2023-06-14 LAB — CBC
HCT: 21 % — ABNORMAL LOW (ref 36.0–46.0)
Hemoglobin: 7.2 g/dL — ABNORMAL LOW (ref 12.0–15.0)
MCH: 30.6 pg (ref 26.0–34.0)
MCHC: 34.3 g/dL (ref 30.0–36.0)
MCV: 89.4 fL (ref 80.0–100.0)
Platelets: 156 10*3/uL (ref 150–400)
RBC: 2.35 MIL/uL — ABNORMAL LOW (ref 3.87–5.11)
RDW: 20.2 % — ABNORMAL HIGH (ref 11.5–15.5)
WBC: 6.4 10*3/uL (ref 4.0–10.5)
nRBC: 0.6 % — ABNORMAL HIGH (ref 0.0–0.2)

## 2023-06-14 LAB — MRSA NEXT GEN BY PCR, NASAL: MRSA by PCR Next Gen: NOT DETECTED

## 2023-06-14 LAB — CBG MONITORING, ED: Glucose-Capillary: 127 mg/dL — ABNORMAL HIGH (ref 70–99)

## 2023-06-14 MED ORDER — CALCIUM GLUCONATE-NACL 2-0.675 GM/100ML-% IV SOLN
2.0000 g | Freq: Once | INTRAVENOUS | Status: AC
  Administered 2023-06-14: 2000 mg via INTRAVENOUS
  Filled 2023-06-14: qty 100

## 2023-06-14 MED ORDER — POTASSIUM CHLORIDE CRYS ER 20 MEQ PO TBCR
40.0000 meq | EXTENDED_RELEASE_TABLET | Freq: Two times a day (BID) | ORAL | Status: DC
Start: 2023-06-14 — End: 2023-06-14

## 2023-06-14 MED ORDER — SODIUM CHLORIDE (PF) 0.9 % IJ SOLN
INTRAMUSCULAR | Status: AC
Start: 2023-06-14 — End: 2023-06-14
  Administered 2023-06-14: 10 mL
  Filled 2023-06-14: qty 10

## 2023-06-14 MED ORDER — SODIUM CHLORIDE 0.9 % IV SOLN: 1.0000 mg/kg/h | INTRAVENOUS | Status: DC

## 2023-06-14 MED ORDER — DEXTROSE IN LACTATED RINGERS 5 % IV SOLN: INTRAVENOUS | Status: DC

## 2023-06-14 MED ORDER — CALCIUM GLUCONATE 10 % IV SOLN: 1.0000 mg/kg/h | INTRAVENOUS | Status: DC

## 2023-06-14 MED ORDER — POTASSIUM CHLORIDE 20 MEQ PO PACK
40.0000 meq | PACK | Freq: Every day | ORAL | Status: AC
  Administered 2023-06-14 – 2023-06-15 (×2): 40 meq via ORAL
  Filled 2023-06-14 (×2): qty 2

## 2023-06-14 MED ORDER — CALCIUM GLUCONATE-NACL 2-0.675 GM/100ML-% IV SOLN
2.0000 g | Freq: Once | INTRAVENOUS | Status: AC
  Administered 2023-06-14: 2000 mg via INTRAVENOUS
  Filled 2023-06-14 (×2): qty 100

## 2023-06-14 NOTE — ED Notes (Signed)
 Pt states that she can not swallow and that is why she is here. SLP evaluation was placed before medication administration

## 2023-06-14 NOTE — Progress Notes (Signed)
 Subjective:   Summary: Christina Byrd is a 58 y.o. year old female currently admitted on the IMTS HD#1 for failure to thrive.  Overnight Events: NOE  Pt seen bedside this AM. She states that she feels weak, but denies any other symptoms like shortness of breath or chest pain.   Objective:  Vital signs in last 24 hours: Vitals:   06/14/23 0300 06/14/23 0400 06/14/23 0530 06/14/23 0600  BP: (!) 113/92 95/79 103/81 106/79  Pulse: (!) 107 96 85 88  Resp: 15 10 (!) 9 10  Temp:      SpO2: 100% 100% 100% 100%   Supplemental O2: Room Air SpO2: 100 %   Physical Exam:  Constitutional: Ill appearing woman, laying in bed, very weak Cardiovascular: RRR, no murmurs, rubs or gallops Pulmonary/Chest: normal work of breathing on room air, lungs clear to auscultation bilaterally Abdominal: soft, non-tender, non-distended Skin: warm and dry Extremities: upper/lower extremity pulses 2+, no lower extremity edema present  There were no vitals filed for this visit.   Intake/Output Summary (Last 24 hours) at 06/14/2023 0644 Last data filed at 06/14/2023 0025 Gross per 24 hour  Intake 1300 ml  Output --  Net 1300 ml   Net IO Since Admission: 1,300 mL [06/14/23 0644]  Pertinent Labs:    Latest Ref Rng & Units 06/14/2023    4:34 AM 06/13/2023    3:18 PM 05/26/2023    4:40 PM  CBC  WBC 4.0 - 10.5 K/uL 6.4  6.1  4.1   Hemoglobin 12.0 - 15.0 g/dL 7.2  8.6  8.5   Hematocrit 36.0 - 46.0 % 21.0  25.6  25.7   Platelets 150 - 400 K/uL 156  192  111        Latest Ref Rng & Units 06/14/2023    4:32 AM 06/13/2023    3:18 PM 05/26/2023    4:40 PM  CMP  Glucose 70 - 99 mg/dL 784  69  62   BUN 6 - 20 mg/dL 13  9  6    Creatinine 0.44 - 1.00 mg/dL 6.96  2.95  2.84   Sodium 135 - 145 mmol/L 136  134  137   Potassium 3.5 - 5.1 mmol/L 3.0  4.5  3.3   Chloride 98 - 111 mmol/L 107  101  101   CO2 22 - 32 mmol/L 18  13  17    Calcium  8.9 - 10.3 mg/dL 5.5  5.2  6.6   Total Protein  6.5 - 8.1 g/dL  6.2  5.7   Total Bilirubin 0.0 - 1.2 mg/dL  0.6  0.7   Alkaline Phos 38 - 126 U/L  164  213   AST 15 - 41 U/L  76  148   ALT 0 - 44 U/L  38  54       Imaging: DG Chest Port 1 View Result Date: 06/13/2023 CLINICAL DATA:  Dysphagia EXAM: PORTABLE CHEST 1 VIEW COMPARISON:  X-ray 12/23/2022 and older FINDINGS: Poor inflation. There is some linear opacity lung bases, right-greater-than-left. Atelectasis is favored. No consolidation, pneumothorax, effusion or edema. Normal cardiopericardial silhouette with tortuous ectatic aorta. Osteopenia. Degenerative changes. IMPRESSION: Underinflation with basilar atelectasis, right-greater-than-left. Electronically Signed   By: Adrianna Horde M.D.   On: 06/13/2023 18:11     Assessment/Plan:   Principal Problem:   Hypocalcemia Active Problems:   Protein-calorie malnutrition, severe (HCC)   AKI (  acute kidney injury) [facility])   Patient Summary: Christina Byrd is a 58 y.o. with a pertinent PMH of alcohol use disorder, DT who presented with decreased PO intake and admitted for hypocalcemia.    #Hypocalcemia  #Hypokalemia #AKI  #AGMA - Resolving Calcium  with improvement overnight, corrected at 7.3 from initial 6.6 on admission. She has been given 2g of Calcium , in the process of receiving another 2g. PTH checked six months ago was elevated appropriately. Will continue to supplement. Creatinine also trending down from 1.4 to 1.25. Potassium low at 3.0 this morning, will supplement. Will need to start thinking about her nutritional status as this is now day 5 without nutrition per patient.   Plan:  - Replete calcium  as needed  - Q6Hr RFP  - Pending SLP eval   #Alcohol Use Disorder  #Hx of DT CIWA very elevated over night at 13, she has been given 1mg  of IV Ativan . Restarted her home keppra  as well. Multiple teams have had multiple discussions about alcohol cessation for her.   #Anemia of Chronic Disease Hb down from 8.6 yesterday to  7.2, may be dilutional given multiple fluid boluses after this. Will monitor closely, and transfue if <7.    Diet: NPO IVF: None,None VTE: Enoxaparin  Code: Full   Dispo: Anticipated discharge to Home in 3 days pending medical management.   [name], MD PGY-2 Internal Medicine Resident Pager Number [phone] Please contact the on call pager after 5 pm and on weekends at [phone].

## 2023-06-14 NOTE — Consult Note (Addendum)
 Consultation  Referring Provider: Medicine service Alwin Baars Primary Care Physician:  Carleen Chary, DO Primary Gastroenterologist:  Dr.Cunningham - hospital only  Reason for Consultation:  Dysphagia   Attending physician's note  I personally saw the patient and performed a substantive portion of this encounter (>50% time spent), including a complete performance of at least one of the key components (MDM, Hx and/or Exam), in conjunction with the APP.  I agree with the APP's note, impression, and recommendations with additional input as follows.     58 year old female with chronic EtOH use, seizure disorder, failure to thrive presented to ER with complaints of dysphagia She has history of chronic intermittent dysphagia, EGD in April and November 2024 with no esophageal abnormality She has history of duodenal ulcer and chronic anemia  Follow-up barium esophagram to exclude esophageal stricture or major dysmotility or severe erosive esophagitis  Continue full liquid diet as tolerated Discussed alcohol cessation PPI twice daily  The patient was provided an opportunity to ask questions and all were answered. The patient agreed with the plan and demonstrated an understanding of the instructions.  Lorena Rolling , MD [phone]     HPI: KIANDRA SANGUINETTI is a 58 y.o. female, with history of chronic EtOH abuse/heavy, alcohol induced neuropathy, failure to thrive, history of hypertension, seizure disorder. He presented to the emergency room yesterday with complaints of progressive dysphagia over the past 2 to 3 days.  She says that she has not been able to get any of her pills down over the past few days, she has generally been able to swallow liquids but solid foods especially meats have been causing problems over the past several weeks.  She says she will swallow meat or heavier food and then eventually regurgitates it back up.  She has not had any alcohol over the past 2 to 3  days by her report, says that she knows that the doctors want her to stop and she wants to stop. She has been on PPI and high-dose PPI by her report in the past without any improvement in symptoms. By notes, she has had about a 90 pound weight loss over the past 6 months. She currently does not have any teeth, says her bottom gums stay sore, and she feels part of her problem is that she cannot chew.  Chest x-ray on admission - atelectasis. Labs-WBC 6.1/hemoglobin 8.6/hematocrit 25.8/platelets 192/MCV 90 Lipase 66 EtOH negative Potassium 4.5/BUN 9/creatinine 1.4 T. bili 0.6/alk phos 164/AST 76  Pressure soft on admission  Patient had EGD in April 2024 with no esophageal abnormality, was noted to have a cratered duodenal ulcer. She also had EGD in November 2024 for complaints of dysphagia and anemia and this was unremarkable.      Past Medical History:  Diagnosis Date   GASTROESOPHAGEAL REFLUX, NO ESOPHAGITIS 04/21/2006   Qualifier: Diagnosis of  By: WATT, JOANNE     GERD (gastroesophageal reflux disease) Dx 1995   Hot flash, menopausal 06/22/2016   Hot flashes 01/30/2020   HTN (hypertension)    Hypotension 10/21/2022   Patient's blood pressure at presentation was 87/65, follow up blood pressure was 81/58. Patient reports regurgitating everything she has taken po for the past month. Denies lightheadedness or dizziness. Discussed GI referral, though patient stated she would be going to the emergency room today for IV fluids.   -GI referral for upper endoscopy placed  -Patient planning on going to the ED for IV flu   Intertrigo 06/22/2016   Nausea &  vomiting 12/23/2022   Nausea and vomiting 12/24/2022   Pain and swelling of left ankle 12/02/2014   Seizures (HCC)    Tendinitis of right hip flexor 07/23/2014   Tendonitis, Achilles, right 04/01/2014    Past Surgical History:  Procedure Laterality Date   BIOPSY  06/03/2022   Procedure: BIOPSY;  Surgeon: Elois Hair, MD;   Location: [facility] ENDOSCOPY;  Service: Gastroenterology;;   ESOPHAGOGASTRODUODENOSCOPY N/A 12/29/2022   Procedure: ESOPHAGOGASTRODUODENOSCOPY (EGD);  Surgeon: Syreeta Figler V, MD;  Location: [facility] ENDOSCOPY;  Service: Gastroenterology;  Laterality: N/A;   ESOPHAGOGASTRODUODENOSCOPY (EGD) WITH PROPOFOL  N/A 06/03/2022   Procedure: ESOPHAGOGASTRODUODENOSCOPY (EGD) WITH PROPOFOL ;  Surgeon: Elois Hair, MD;  Location: [facility] ENDOSCOPY;  Service: Gastroenterology;  Laterality: N/A;   HOT HEMOSTASIS N/A 06/03/2022   Procedure: HOT HEMOSTASIS (ARGON PLASMA COAGULATION/BICAP);  Surgeon: Elois Hair, MD;  Location: [facility] ENDOSCOPY;  Service: Gastroenterology;  Laterality: N/A;    Prior to Admission medications   Medication Sig Start Date End Date Taking? Authorizing Provider  allopurinol  (ZYLOPRIM ) 100 MG tablet Take 2 tablets (200 mg total) by mouth daily. 04/29/23  Yes Juberg, Veryl Gottron, DO  atorvastatin  (LIPITOR) 20 MG tablet Take 1 tablet (20 mg total) by mouth daily. 05/03/23  Yes Carleen Chary, DO  cetirizine  (ZYRTEC  ALLERGY) 10 MG tablet Take 1 tablet (10 mg total) by mouth daily. 05/26/23 05/25/24 Yes Juberg, Christopher, DO  DULoxetine  (CYMBALTA ) 30 MG capsule Take 1 capsule (30 mg total) by mouth daily. 05/26/23 05/25/24 Yes Juberg, Christopher, DO  ergocalciferol  (VITAMIN D2) 1.25 MG (50000 UT) capsule Take 1 capsule by mouth once a week. Every Wednesday starting 11/13 04/29/23  Yes Juberg, Veryl Gottron, DO  etodolac  (LODINE ) 400 MG tablet Take 1 tablet (400 mg total) by mouth 2 (two) times daily. 04/29/23  Yes Juberg, Veryl Gottron, DO  feeding supplement (ENSURE ENLIVE / ENSURE PLUS) LIQD Take 237 mLs by mouth 3 (three) times daily between meals. Patient taking differently: Take 237 mLs by mouth daily. 12/30/22  Yes Jackolyn Masker, MD  fluticasone  (FLONASE ) 50 MCG/ACT nasal spray Use one spray in each nostril daily Patient taking differently: Place 1 spray into both nostrils daily. Use one spray in each  nostril daily 05/27/23  Yes Juberg, Christopher, DO  folic acid  (FOLVITE ) 1 MG tablet Take 1 tablet (1 mg total) by mouth daily. 05/03/23  Yes Carleen Chary, DO  gabapentin  (NEURONTIN ) 100 MG capsule Take 1 capsule (100 mg total) by mouth 3 (three) times daily. 06/06/23 06/05/24 Yes Bender, Sherline Distel, DO  hydrocortisone  cream 1 % Apply 1 Application topically 2 (two) times daily. Patient taking differently: Apply 1 Application topically daily as needed for itching. 01/05/23  Yes Masters, Katie, DO  levETIRAcetam  (KEPPRA ) 750 MG tablet Take 1 tablet (750 mg total) by mouth 2 (two) times daily. 05/03/23  Yes Carleen Chary, DO  Multiple Vitamin (MULTIVITAMIN WITH MINERALS) TABS tablet Take 1 tablet by mouth daily. 05/03/23  Yes Carleen Chary, DO  ondansetron  (ZOFRAN ) 4 MG tablet Take 1 tablet (4 mg total) by mouth every 8 (eight) hours as needed for nausea or vomiting. 05/03/23  Yes Juberg, Christopher, DO  pantoprazole  (PROTONIX ) 40 MG tablet Take 1 tablet (40 mg total) by mouth 2 (two) times daily. 05/03/23  Yes Juberg, Veryl Gottron, DO  potassium chloride  SA (KLOR-CON  M) 20 MEQ tablet Take 1 tablet (20 mEq total) by mouth 2 (two) times daily for 14 days. 05/27/23 06/13/23 Yes Juberg, Christopher, DO  thiamine  (VITAMIN B1) 100 MG tablet Take 1 tablet (100 mg  total) by mouth daily. 05/03/23  Yes Juberg, Veryl Gottron, DO  Zinc  Oxide (TRIPLE PASTE) 12.8 % ointment Apply topically as needed for irritation. In your buttocks 12/30/22  Yes Jackolyn Masker, MD  colchicine  0.6 MG tablet Take 1 tablet (0.6 mg total) by mouth daily. Patient not taking: Reported on 01/24/2023 01/12/23   Atway, Rayann N, DO  Nystatin (GERHARDT'S BUTT CREAM) CREA Apply 1 Application topically 4 (four) times daily -  before meals and at bedtime. Patient not taking: Reported on 01/24/2023 12/30/22   Jackolyn Masker, MD  metFORMIN  (GLUCOPHAGE ) 500 MG tablet Take 0.5 tablets (250 mg total) by mouth daily with breakfast. 03/05/20 04/02/20  Hassie Lint, PA-C    Current Facility-Administered Medications  Medication Dose Route Frequency Provider Last Rate Last Admin   calcium  gluconate 2 g/ 100 mL sodium chloride  IVPB  2 g Intravenous Once Nooruddin, Saad, MD       dextrose  5 % in lactated ringers  infusion   Intravenous Continuous Nooruddin, Saad, MD       enoxaparin  (LOVENOX ) injection 40 mg  40 mg Subcutaneous Q24H Nooruddin, Saad, MD   40 mg at 06/13/23 2055   folic acid  (FOLVITE ) tablet 1 mg  1 mg Oral Daily Nooruddin, Saad, MD       levETIRAcetam  (KEPPRA ) 750 mg in sodium chloride  0.9 % 100 mL IVPB  750 mg Intravenous Q12H Nooruddin, Saad, MD 430 mL/hr at 06/14/23 1543 750 mg at 06/14/23 1543   LORazepam  (ATIVAN ) tablet 1-4 mg  1-4 mg Oral Q1H PRN Nooruddin, Saad, MD       Or   LORazepam  (ATIVAN ) injection 1-4 mg  1-4 mg Intravenous Q1H PRN Nooruddin, Saad, MD   2 mg at 06/13/23 2116   multivitamin with minerals tablet 1 tablet  1 tablet Oral Daily Nooruddin, Saad, MD       pantoprazole  (PROTONIX ) injection 40 mg  40 mg Intravenous Q12H Nooruddin, Saad, MD   40 mg at 06/14/23 1020   potassium chloride  (KLOR-CON ) packet 40 mEq  40 mEq Oral Daily Nooruddin, Saad, MD       thiamine  (VITAMIN B1) tablet 100 mg  100 mg Oral Daily Nooruddin, Saad, MD       Or   thiamine  (VITAMIN B1) injection 100 mg  100 mg Intravenous Daily Nooruddin, Saad, MD   100 mg at 06/13/23 2056    Allergies as of 06/13/2023 - Review Complete 06/13/2023  Allergen Reaction Noted   Losartan  potassium  12/20/2012   Levaquin  [levofloxacin  in d5w] Rash 02/06/2013   Ace inhibitors Cough 01/30/2008   Codeine  Nausea Only 05/26/2015   Cefepime  Rash 09/05/2012   Chlorhexidine Itching and Rash 07/09/2022   Vancomycin  Rash 09/05/2012    Family History  Problem Relation Age of Onset   CAD Mother     Social History   Socioeconomic History   Marital status: Single    Spouse name: Not on file   Number of children: Not on file   Years of education: Not on  file   Highest education level: Not on file  Occupational History   Not on file  Tobacco Use   Smoking status: Former    Current packs/day: 0.00    Types: Cigarettes    Quit date: 06/25/2012    Years since quitting: 10.9   Smokeless tobacco: Never  Vaping Use   Vaping status: Never Used  Substance and Sexual Activity   Alcohol use: Not Currently    Alcohol/week: 49.0 standard drinks of alcohol  Types: 14 Cans of beer, 35 Shots of liquor per week   Drug use: No   Sexual activity: Not Currently  Other Topics Concern   Not on file  Social History Narrative   Pt lives in single story home with her partner   Has 1 son   5 grandchildren   10th grade education   Works at [employer].    Right handed   Social Drivers of Health   Financial Resource Strain: Not on file  Food Insecurity: No Food Insecurity (06/14/2023)   Hunger Vital Sign    Worried About Running Out of Food in the Last Year: Never true    Ran Out of Food in the Last Year: Never true  Transportation Needs: No Transportation Needs (06/14/2023)   PRAPARE - Administrator, Civil Service (Medical): No    Lack of Transportation (Non-Medical): No  Physical Activity: Not on file  Stress: Not on file  Social Connections: Not on file  Intimate Partner Violence: Not At Risk (06/14/2023)   Humiliation, Afraid, Rape, and Kick questionnaire    Fear of Current or Ex-Partner: No    Emotionally Abused: No    Physically Abused: No    Sexually Abused: No    Review of Systems: Pertinent positive and negative review of systems were noted in the above HPI section.  All other review of systems was otherwise negative.   Physical Exam: Vital signs in last 24 hours: Temp:  [95.1 F (35.1 C)-99.2 F (37.3 C)] 97.6 F (36.4 C) (04/22 1524) Pulse Rate:  [83-114] 100 (04/22 1209) Resp:  [9-19] 17 (04/22 1209) BP: (92-123)/(76-93) 114/87 (04/22 1524) SpO2:  [98 %-100 %] 98 % (04/22 1209) Last BM Date :  06/12/23 General:   Alert,  Well-developed, thin older African-American female, pleasant and cooperative in NAD talking a lot about her babies which are her dogs Head:  Normocephalic and atraumatic. Eyes:  Sclera clear, no icterus.   Conjunctiva pink. Ears:  Normal auditory acuity. Nose:  No deformity, discharge,  or lesions. Mouth:  No deformity or lesions.  Edentulous Neck:  Supple; no masses or thyromegaly. Lungs:  Clear throughout to auscultation.   No wheezes, crackles, or rhonchi.  Heart:  Regular rate and rhythm; no murmurs, clicks, rubs,  or gallops. Abdomen:  Soft,nontender, BS active,nonpalp mass or hsm.   Rectal: Not done Msk:  Symmetrical without gross deformities. . Pulses:  Normal pulses noted. Extremities:  Without clubbing or edema. Neurologic:  Alert and  oriented x4;  grossly normal neurologically. Skin:  Intact without significant lesions or rashes.. Psych:  Alert and cooperative. Normal mood and affect.  Intake/Output from previous day: 04/21 0701 - 04/22 0700 In: 1300 [IV Piggyback:1300] Out: -  Intake/Output this shift: No intake/output data recorded.  Lab Results: Recent Labs    06/13/23 1518 06/14/23 0434  WBC 6.1 6.4  HGB 8.6* 7.2*  HCT 25.6* 21.0*  PLT 192 156   BMET Recent Labs    06/13/23 1518 06/14/23 0432 06/14/23 1325  NA 134* 136 138  K 4.5 3.0* 3.4*  CL 101 107 106  CO2 13* 18* 19*  GLUCOSE 69* 129* 81  BUN 9 13 12   CREATININE 1.40* 1.25* 1.34*  CALCIUM  5.2* 5.5* 6.3*   LFT Recent Labs    06/13/23 1518 06/14/23 0432 06/14/23 1325  PROT 6.2*  --   --   ALBUMIN 2.2*   < > 1.7*  AST 76*  --   --  ALT 38  --   --   ALKPHOS 164*  --   --   BILITOT 0.6  --   --    < > = values in this interval not displayed.   PT/INR No results for input(s): "LABPROT", "INR" in the last 72 hours. Hepatitis Panel No results for input(s): "HEPBSAG", "HCVAB", "HEPAIGM", "HEPBIGM" in the last 72 hours.   IMPRESSION:  #83 58 year old  African-American female with significant weight loss, failure to thrive, in setting of chronic EtOH abuse who presents now with complaints of progressive dysphagia to pills and solid foods, to the point that she was not able to keep any of her medicines down for about 3 days prior to admission.  Interesting she had negative EGD in November 2024 Etiology of her dysphagia is not clear, rule out motility disorder  #2 malnutrition secondary to chronic EtOH abuse plus minus above #3 hypocalcemia #4 hypomagnesemia #5 chronic anemia #6 history of EtOH related neuropathy  Plan; okay for full liquids We will schedule for barium swallow with tablet in a.m., then depending on results of barium swallow will decide if EGD is indicated. Replace electrolytes GI will follow with you    Amy EsterwoodPA-C  06/14/2023, 4:16 PM

## 2023-06-14 NOTE — Evaluation (Signed)
 Clinical/Bedside Swallow Evaluation Patient Details  Name: ASIA DUSENBURY MRN: [id number] Date of Birth: 08/26/1965  Today's Date: 06/14/2023 Time: SLP Start Time (ACUTE ONLY): 1402 SLP Stop Time (ACUTE ONLY): 1414 SLP Time Calculation (min) (ACUTE ONLY): 12 min  Past Medical History:  Past Medical History:  Diagnosis Date   GASTROESOPHAGEAL REFLUX, NO ESOPHAGITIS 04/21/2006   Qualifier: Diagnosis of  By: WATT, JOANNE     GERD (gastroesophageal reflux disease) Dx 1995   Hot flash, menopausal 06/22/2016   Hot flashes 01/30/2020   HTN (hypertension)    Hypotension 10/21/2022   Patient's blood pressure at presentation was 87/65, follow up blood pressure was 81/58. Patient reports regurgitating everything she has taken po for the past month. Denies lightheadedness or dizziness. Discussed GI referral, though patient stated she would be going to the emergency room today for IV fluids.   -GI referral for upper endoscopy placed  -Patient planning on going to the ED for IV flu   Intertrigo 06/22/2016   Nausea & vomiting 12/23/2022   Nausea and vomiting 12/24/2022   Pain and swelling of left ankle 12/02/2014   Seizures (HCC)    Tendinitis of right hip flexor 07/23/2014   Tendonitis, Achilles, right 04/01/2014   Past Surgical History:  Past Surgical History:  Procedure Laterality Date   BIOPSY  06/03/2022   Procedure: BIOPSY;  Surgeon: Elois Hair, MD;  Location: [facility] ENDOSCOPY;  Service: Gastroenterology;;   ESOPHAGOGASTRODUODENOSCOPY N/A 12/29/2022   Procedure: ESOPHAGOGASTRODUODENOSCOPY (EGD);  Surgeon: Nandigam, Kavitha V, MD;  Location: [facility] ENDOSCOPY;  Service: Gastroenterology;  Laterality: N/A;   ESOPHAGOGASTRODUODENOSCOPY (EGD) WITH PROPOFOL  N/A 06/03/2022   Procedure: ESOPHAGOGASTRODUODENOSCOPY (EGD) WITH PROPOFOL ;  Surgeon: Elois Hair, MD;  Location: [facility] ENDOSCOPY;  Service: Gastroenterology;  Laterality: N/A;   HOT HEMOSTASIS N/A 06/03/2022   Procedure: HOT HEMOSTASIS  (ARGON PLASMA COAGULATION/BICAP);  Surgeon: Elois Hair, MD;  Location: [facility] ENDOSCOPY;  Service: Gastroenterology;  Laterality: N/A;   HPI:  58 yo F with hx of ETOH abuse, seizures, comes to hospital with worsening dysphagia over the past 3 days. She has been unable to swallow her medications, and has not been able to eat solid food. She has had a feeling that food gets stuck in her throat and she then has n/v. She tried applesauce but this also was unsuccessful. She has been able to take liquids.   Ensure has stayed down 50% of the time.   Has lost 90# in the last 6 months.   Last seizure ~ 1 year ago  Last drink was one 7 oz beer and 1 shot of hard liquor yesterday.    Assessment / Plan / Recommendation  Clinical Impression  Patient presents with swallowing function that clinically appears WFL from an oropharyngeal standpoint with concerns for esophageal invoolvement. Oral mechanism exam was unremarkable. Notably, patient unable to remain awake during session, nodding off after ~5 second stretches, though was able to answer questions re: swallowing symptoms. Patient endorses globus sensation at the level of the chest, regurgitation, gagging, and was observed during session with belching after small amounts of thin liquids and puree. No overt s/sx of penetration/aspiration observed across all trials, and no difficulty oropharyngeally was reported. From an SLP standpoint, patient safe to initiate Dys1/thin liquid diet (recommended only due to extreme lethargy, likely would be able to upgrade if more alert), however will defer decision to initiate oral diet to medical team due to severity of esophageal symptoms and patient's subsequent statement that she "cannot swallow".  Recommend assessment by GI. SLP will plan to follow up once patient is placed on diet by medical team to ensure tolerance due to limited intake during today's session. Administer medications crushed in puree. SLP Visit Diagnosis:  Dysphagia, unspecified (R13.10)    Aspiration Risk  Mild aspiration risk    Diet Recommendation  (will defer diet recommendations to medical team)    Liquid Administration via: Straw;Cup Medication Administration: Crushed with puree Supervision: Staff to assist with self feeding Compensations: Slow rate;Small sips/bites Postural Changes: Seated upright at 90 degrees    Other  Recommendations Recommended Consults: Consider GI evaluation Oral Care Recommendations: Oral care BID    Recommendations for follow up therapy are one component of a multi-disciplinary discharge planning process, led by the attending physician.  Recommendations may be updated based on patient status, additional functional criteria and insurance authorization.  Follow up Recommendations No SLP follow up      Assistance Recommended at Discharge    Functional Status Assessment Patient has had a recent decline in their functional status and demonstrates the ability to make significant improvements in function in a reasonable and predictable amount of time.  Frequency and Duration min 1 x/week  1 week       Prognosis Prognosis for improved oropharyngeal function: Good      Swallow Study   General Date of Onset: 06/13/23 HPI: 58 yo F with hx of ETOH abuse, seizures, comes to hospital with worsening dysphagia over the past 3 days. She has been unable to swallow her medications, and has not been able to eat solid food. She has had a feeling that food gets stuck in her throat and she then has n/v. She tried applesauce but this also was unsuccessful. She has been able to take liquids.   Ensure has stayed down 50% of the time.   Has lost 90# in the last 6 months.   Last seizure ~ 1 year ago  Last drink was one 7 oz beer and 1 shot of hard liquor yesterday. Type of Study: Bedside Swallow Evaluation Previous Swallow Assessment: BSE 12/2022 Diet Prior to this Study: NPO Temperature Spikes Noted: No Respiratory Status:  Room air History of Recent Intubation: No Behavior/Cognition: Cooperative;Lethargic/Drowsy;Distractible;Requires cueing Oral Cavity Assessment: Within Functional Limits Oral Care Completed by SLP: No Oral Cavity - Dentition: Edentulous Vision: Functional for self-feeding Self-Feeding Abilities: Needs assist Patient Positioning: Upright in bed Baseline Vocal Quality: Low vocal intensity Volitional Swallow: Able to elicit    Oral/Motor/Sensory Function Overall Oral Motor/Sensory Function: Within functional limits   Ice Chips Ice chips: Not tested   Thin Liquid Thin Liquid: Within functional limits Presentation: Cup;Straw;Self Fed    Nectar Thick Nectar Thick Liquid: Not tested   Honey Thick Honey Thick Liquid: Not tested   Puree Puree: Within functional limits Presentation: Spoon   Solid     Solid: Not tested     Dorla Gartner, M.A., CCC-SLP  Catricia Scheerer A Sammi Stolarz 06/14/2023,3:42 PM

## 2023-06-14 NOTE — ED Notes (Signed)
 Pt had an episode of urinary incontinence. NT and primary RN assisted with cleaning sheets and changing pt. Pt complaint she was too hot which then tempeture was checked and read 99.2. Bair hugger turned off and light sheet was placed over pt. Pt resting in bed comfortably now

## 2023-06-14 NOTE — Progress Notes (Deleted)
 Internal Medicine Attending:  I personally saw and examined the patient. Discussed with the resident and agree with the resident's findings and plan of care as documented in the resident's note. Will continue to supplement and follow Ca+ Corrects to 7.1 with her Alb. Mg now normal.  Watch on tele SLP  Follow CIWA Resume her keppra .   Sandie Cross, MD

## 2023-06-14 NOTE — Plan of Care (Signed)
  Problem: Education: Goal: Knowledge of General Education information will improve Description: Including pain rating scale, medication(s)/side effects and non-pharmacologic comfort measures Outcome: Progressing   Problem: Health Behavior/Discharge Planning: Goal: Ability to manage health-related needs will improve Outcome: Progressing   Problem: Clinical Measurements: Goal: Ability to maintain clinical measurements within normal limits will improve Outcome: Progressing Goal: Will remain free from infection Outcome: Progressing Goal: Diagnostic test results will improve Outcome: Progressing Goal: Respiratory complications will improve Outcome: Progressing Goal: Cardiovascular complication will be avoided Outcome: Progressing   Problem: Activity: Goal: Risk for activity intolerance will decrease Outcome: Progressing   Problem: Nutrition: Goal: Adequate nutrition will be maintained Outcome: Progressing   Problem: Coping: Goal: Level of anxiety will decrease Outcome: Progressing   Problem: Elimination: Goal: Will not experience complications related to bowel motility Outcome: Progressing Goal: Will not experience complications related to urinary retention Outcome: Progressing   Problem: Pain Managment: Goal: General experience of comfort will improve and/or be controlled Outcome: Progressing   Problem: Safety: Goal: Ability to remain free from injury will improve Outcome: Progressing   Problem: Skin Integrity: Goal: Risk for impaired skin integrity will decrease Outcome: Progressing   Problem: Education: Goal: Knowledge of disease or condition will improve Outcome: Progressing Goal: Understanding of discharge needs will improve Outcome: Progressing   Problem: Health Behavior/Discharge Planning: Goal: Ability to identify changes in lifestyle to reduce recurrence of condition will improve Outcome: Progressing   Problem: Physical Regulation: Goal:  Complications related to the disease process, condition or treatment will be avoided or minimized Outcome: Progressing   Problem: Safety: Goal: Ability to remain free from injury will improve Outcome: Progressing

## 2023-06-15 ENCOUNTER — Inpatient Hospital Stay (HOSPITAL_COMMUNITY)

## 2023-06-15 DIAGNOSIS — F1010 Alcohol abuse, uncomplicated: Secondary | ICD-10-CM | POA: Diagnosis not present

## 2023-06-15 DIAGNOSIS — K449 Diaphragmatic hernia without obstruction or gangrene: Secondary | ICD-10-CM

## 2023-06-15 DIAGNOSIS — E46 Unspecified protein-calorie malnutrition: Secondary | ICD-10-CM | POA: Diagnosis not present

## 2023-06-15 DIAGNOSIS — R1310 Dysphagia, unspecified: Secondary | ICD-10-CM | POA: Diagnosis not present

## 2023-06-15 DIAGNOSIS — K219 Gastro-esophageal reflux disease without esophagitis: Secondary | ICD-10-CM

## 2023-06-15 LAB — CBC
HCT: 20.1 % — ABNORMAL LOW (ref 36.0–46.0)
HCT: 29 % — ABNORMAL LOW (ref 36.0–46.0)
Hemoglobin: 6.7 g/dL — CL (ref 12.0–15.0)
Hemoglobin: 9.5 g/dL — ABNORMAL LOW (ref 12.0–15.0)
MCH: 30.5 pg (ref 26.0–34.0)
MCH: 30.4 pg (ref 26.0–34.0)
MCHC: 33.3 g/dL (ref 30.0–36.0)
MCHC: 32.8 g/dL (ref 30.0–36.0)
MCV: 91.4 fL (ref 80.0–100.0)
MCV: 92.9 fL (ref 80.0–100.0)
Platelets: 175 10*3/uL (ref 150–400)
Platelets: 156 10*3/uL (ref 150–400)
RBC: 2.2 MIL/uL — ABNORMAL LOW (ref 3.87–5.11)
RBC: 3.12 MIL/uL — ABNORMAL LOW (ref 3.87–5.11)
RDW: 20.6 % — ABNORMAL HIGH (ref 11.5–15.5)
RDW: 18.7 % — ABNORMAL HIGH (ref 11.5–15.5)
WBC: 6.7 10*3/uL (ref 4.0–10.5)
WBC: 6.7 10*3/uL (ref 4.0–10.5)
nRBC: 0.7 % — ABNORMAL HIGH (ref 0.0–0.2)
nRBC: 0.6 % — ABNORMAL HIGH (ref 0.0–0.2)

## 2023-06-15 LAB — RENAL FUNCTION PANEL
Albumin: 1.7 g/dL — ABNORMAL LOW (ref 3.5–5.0)
Albumin: 1.8 g/dL — ABNORMAL LOW (ref 3.5–5.0)
Anion gap: 14 (ref 5–15)
Anion gap: 12 (ref 5–15)
BUN: 12 mg/dL (ref 6–20)
BUN: 10 mg/dL (ref 6–20)
CO2: 19 mmol/L — ABNORMAL LOW (ref 22–32)
CO2: 16 mmol/L — ABNORMAL LOW (ref 22–32)
Calcium: 6.8 mg/dL — ABNORMAL LOW (ref 8.9–10.3)
Calcium: 7.2 mg/dL — ABNORMAL LOW (ref 8.9–10.3)
Chloride: 109 mmol/L (ref 98–111)
Chloride: 112 mmol/L — ABNORMAL HIGH (ref 98–111)
Creatinine, Ser: 1.01 mg/dL — ABNORMAL HIGH (ref 0.44–1.00)
Creatinine, Ser: 0.94 mg/dL (ref 0.44–1.00)
GFR, Estimated: >60 mL/min (ref >60)
GFR, Estimated: >60 mL/min (ref >60)
Glucose, Bld: 101 mg/dL — ABNORMAL HIGH (ref 70–99)
Glucose, Bld: 102 mg/dL — ABNORMAL HIGH (ref 70–99)
Phosphorus: 2.6 mg/dL (ref 2.5–4.6)
Phosphorus: 2.4 mg/dL — ABNORMAL LOW (ref 2.5–4.6)
Potassium: 3.9 mmol/L (ref 3.5–5.1)
Potassium: 4.6 mmol/L (ref 3.5–5.1)
Sodium: 142 mmol/L (ref 135–145)
Sodium: 140 mmol/L (ref 135–145)

## 2023-06-15 LAB — HEMOGLOBIN AND HEMATOCRIT, BLOOD
HCT: 19.5 % — ABNORMAL LOW (ref 36.0–46.0)
Hemoglobin: 6.5 g/dL — CL (ref 12.0–15.0)

## 2023-06-15 LAB — CALCIUM, IONIZED: Calcium, Ionized, Serum: 4 mg/dL — ABNORMAL LOW (ref 4.5–5.6)

## 2023-06-15 LAB — MAGNESIUM: Magnesium: 1.3 mg/dL — ABNORMAL LOW (ref 1.7–2.4)

## 2023-06-15 LAB — GLUCOSE, CAPILLARY: Glucose-Capillary: 124 mg/dL — ABNORMAL HIGH (ref 70–99)

## 2023-06-15 LAB — PREPARE RBC (CROSSMATCH)

## 2023-06-15 MED ORDER — METOCLOPRAMIDE HCL 5 MG PO TABS
5.0000 mg | ORAL_TABLET | Freq: Three times a day (TID) | ORAL | Status: DC
  Administered 2023-06-16 – 2023-06-22 (×16): 5 mg via ORAL
  Filled 2023-06-15 (×21): qty 1

## 2023-06-15 MED ORDER — HYDROMORPHONE HCL 2 MG PO TABS
1.0000 mg | ORAL_TABLET | Freq: Once | ORAL | Status: AC
  Administered 2023-06-15: 1 mg via ORAL
  Filled 2023-06-15: qty 1

## 2023-06-15 MED ORDER — MAGNESIUM SULFATE 4 GM/100ML IV SOLN
4.0000 g | Freq: Once | INTRAVENOUS | Status: AC
  Administered 2023-06-15: 4 g via INTRAVENOUS
  Filled 2023-06-15: qty 100

## 2023-06-15 MED ORDER — LIDOCAINE 5 % EX PTCH
1.0000 | MEDICATED_PATCH | CUTANEOUS | Status: DC
Start: 2023-06-15 — End: 2023-06-22
  Administered 2023-06-16 – 2023-06-22 (×3): 1 via TRANSDERMAL
  Filled 2023-06-15 (×5): qty 1

## 2023-06-15 MED ORDER — ENSURE ENLIVE PO LIQD
237.0000 mL | Freq: Three times a day (TID) | ORAL | Status: DC
  Administered 2023-06-15 – 2023-06-22 (×8): 237 mL via ORAL

## 2023-06-15 MED ORDER — ACETAMINOPHEN 325 MG PO TABS
650.0000 mg | ORAL_TABLET | Freq: Four times a day (QID) | ORAL | Status: DC | PRN
  Administered 2023-06-15 – 2023-06-17 (×4): 650 mg via ORAL
  Filled 2023-06-15 (×7): qty 2

## 2023-06-15 MED ORDER — LOPERAMIDE HCL 2 MG PO CAPS
2.0000 mg | ORAL_CAPSULE | ORAL | Status: DC | PRN
  Administered 2023-06-16 – 2023-06-19 (×2): 2 mg via ORAL
  Filled 2023-06-15 (×3): qty 1

## 2023-06-15 MED ORDER — CALCIUM GLUCONATE-NACL 1-0.675 GM/50ML-% IV SOLN
1.0000 g | Freq: Once | INTRAVENOUS | Status: AC
  Administered 2023-06-15: 1000 mg via INTRAVENOUS
  Filled 2023-06-15: qty 50

## 2023-06-15 MED ORDER — FLUOCINONIDE EMULSIFIED BASE 0.05 % EX CREA
TOPICAL_CREAM | Freq: Every day | CUTANEOUS | Status: DC
Start: 2023-06-15 — End: 2023-06-22
  Filled 2023-06-15: qty 15

## 2023-06-15 NOTE — Progress Notes (Signed)
 Speech Language Pathology Treatment: Dysphagia  Patient Details Name: Christina Byrd MRN: [id number] DOB: 05/11/1965 Today's Date: 06/15/2023 Time: 5621-3086 SLP Time Calculation (min) (ACUTE ONLY): 13 min  Assessment / Plan / Recommendation Clinical Impression  SLP conducted skilled therapy session targeting dysphagia goals. Upon entry, RN reports that patient recently returned from barium swallow test where her esophageal function was assessed. Patient indicates that she plans to hear from GI doctor re: next steps in relation to managing GI symptoms. SLP administered thin liquids and vanilla pudding with patient exhibited no overt s/sx of penetration/aspiration nor oropharyngeal difficulty. Patient's swallowing symptoms fall within realm of GI/esophagus, thus SLP will sign off as all ST goals have been met. Patient was left in room with call bell in reach and alarm set. SLP will continue to target goals per plan of care.     HPI HPI: 58 yo F with hx of ETOH abuse, seizures, comes to hospital with worsening dysphagia over the past 3 days. She has been unable to swallow her medications, and has not been able to eat solid food. She has had a feeling that food gets stuck in her throat and she then has n/v. She tried applesauce but this also was unsuccessful. She has been able to take liquids.   Ensure has stayed down 50% of the time.   Has lost 90# in the last 6 months.   Last seizure ~ 1 year ago  Last drink was one 7 oz beer and 1 shot of hard liquor yesterday.      SLP Plan  All goals met      Recommendations for follow up therapy are one component of a multi-disciplinary discharge planning process, led by the attending physician.  Recommendations may be updated based on patient status, additional functional criteria and insurance authorization.    Recommendations  Diet recommendations:  (defer to GI) Liquids provided via: Cup;Straw Medication Administration: Crushed with  puree Supervision: Patient able to self feed Compensations: Slow rate;Small sips/bites Postural Changes and/or Swallow Maneuvers: Seated upright 90 degrees;Upright 30-60 min after meal                  Oral care BID   PRN Dysphagia, unspecified (R13.10)     All goals met     Christina Byrd  06/15/2023, 12:37 PM

## 2023-06-15 NOTE — Progress Notes (Signed)
 Initial Nutrition Assessment  DOCUMENTATION CODES:   Severe malnutrition in context of social or environmental circumstances  INTERVENTION:  Continue Folic acid , Thiamine , and Multivitamin w/ minerals daily Ensure Enlive po TID, each supplement provides 350 kcal and 20 grams of protein. Encourage good PO intake   NUTRITION DIAGNOSIS:   Severe Malnutrition related to social / environmental circumstances as evidenced by severe muscle depletion, severe fat depletion, percent weight loss.  GOAL:   Patient will meet greater than or equal to 90% of their needs  MONITOR:   PO intake, Supplement acceptance, Labs, I & O's  REASON FOR ASSESSMENT:   Malnutrition Screening Tool    ASSESSMENT:   58 y.o. female presented to the ED with difficulty swallowing medication. PMH includes EtOH abuse, hypothyroidism, GERD, CKD III, and recurrent GI bleeds secondary to AVM. Pt admitted with AKI, hypocalcemia, and dysphagia.   4/21 - Admitted 4/22 - SLP evaluation; diet advanced to full liquids 4/23 - s/p barium swallow; diet advanced to soft  Met with pt and family at bedside. Reports that she was not eating great PTA due to issues swallowing. Shares that it feels as foods and beverages are stuck in her throat and then sit on top of her stomach. Shares that she is hungry now and wants to eat now; provided RD with lunch order. Shares that she was drinking 1 Ensure per day at home, typically half at a time. Just prior to coming to the hospital she was even having difficulty with the Ensure and was regurgitating it.   Followed by SLP.   Meal Intake  4/23: 0% x 1 meal  Pt endorses significant weight loss from previous admissions, reports now weighing to 105#. Per EMR, pt with a 20.2% weight loss within 5 months, this is clinically significant for time frame.   Medications: Folic acid , MVI, Protonix , Potassium Chloride , Thiamine  Labs reviewed.   NUTRITION - FOCUSED PHYSICAL EXAM:  Flowsheet Row  Most Recent Value  Orbital Region Mild depletion  Upper Arm Region Severe depletion  Thoracic and Lumbar Region Severe depletion  Buccal Region No depletion  Temple Region No depletion  Clavicle Bone Region Severe depletion  Clavicle and Acromion Bone Region Severe depletion  Scapular Bone Region Severe depletion  Dorsal Hand Mild depletion  Patellar Region Unable to assess  Anterior Thigh Region Unable to assess  Posterior Calf Region Unable to assess  Edema (RD Assessment) Mild  Hair Unable to assess  [Hat]  Eyes Reviewed  Mouth Reviewed  Skin Reviewed  Nails Reviewed   Diet Order:   Diet Order             DIET SOFT Fluid consistency: Thin  Diet effective now                  EDUCATION NEEDS:  No education needs have been identified at this time  Skin:  Skin Assessment: Reviewed RN Assessment  Last BM:  4/23 - Type 7, large  Height:  Ht Readings from Last 1 Encounters:  06/14/23 5\' 2"  (1.575 m)   Weight:  Wt Readings from Last 1 Encounters:  06/15/23 47.8 kg   Ideal Body Weight:  50 kg  BMI:  Body mass index is 19.27 kg/m.  Estimated Nutritional Needs:  Kcal:  1500-1700 Protein:  75-95 grams Fluid:  >/= 1.5 L   Doneta Furbish RD, LDN Clinical Dietitian

## 2023-06-15 NOTE — Evaluation (Signed)
 Occupational Therapy Evaluation Patient Details Name: Christina Byrd MRN: [id number] DOB: 10-May-1965 Today's Date: 06/15/2023   History of Present Illness   58 y.o. female presents to [facility] with worsening dysphagia over 3 days. Admitted with failure to thrive in setting of chronic EtOH abuse, AKI, and hypocalcemia. PMHx: alcohol use disorder, withdrawal seizures, hypothyroidism, recurrent GI bleed secondary to AVM, chronic dysphagia     Clinical Impressions At baseline, pt's husband assists her with dressing, bathing, toileting, functional mobility, and IADLs. During prior admission in November 2024, pt was completing ADLs largely with Set up to Contact guard assist. Pt now presents with decreased activity tolerance, generalized B UE weakness, decreased balance, decreased cognition, and decreased safety and independence with functional tasks. Pt currently demonstrates ability to complete UB ADLs with Set up to Mod assist, LB ADLs with Contact guard to Max assist, and bed mobility with Mod assist. Pt will benefit from acute skilled OT services to address deficits outlined below and to increase safety and independence with functional tasks. Post acute discharge, pt will benefit from [facility] OT to maximize rehab potential.      If plan is discharge home, recommend the following:   A lot of help with walking and/or transfers;A lot of help with bathing/dressing/bathroom;Assistance with cooking/housework;Assistance with feeding;Assist for transportation;Help with stairs or ramp for entrance (set up for self-feeding)     Functional Status Assessment   Patient has had a recent decline in their functional status and demonstrates the ability to make significant improvements in function in a reasonable and predictable amount of time.     Equipment Recommendations   BSC/3in1     Recommendations for Other Services         Precautions/Restrictions   Precautions Precautions:  Fall Restrictions Weight Bearing Restrictions Per Provider Order: No     Mobility Bed Mobility Overal bed mobility: Needs Assistance Bed Mobility: Supine to Sit, Sit to Supine     Supine to sit: Mod assist, HOB elevated Sit to supine: Mod assist, HOB elevated   General bed mobility comments: ModA to bring LEs off EOB and slightly raise trunk. Assist needed to shift hips forward with use of bed pad. ModA to return to supine for LE management. TotalAx2 needed to scoot towards HOB in supine    Transfers                   General transfer comment: pt declined transfer      Balance Overall balance assessment: Needs assistance, Mild deficits observed, not formally tested Sitting-balance support: No upper extremity supported, Feet supported Sitting balance-Leahy Scale: Fair Sitting balance - Comments: slight posterior lean, CGA for safety                                   ADL either performed or assessed with clinical judgement   ADL Overall ADL's : Needs assistance/impaired Eating/Feeding: Set up;Sitting   Grooming: Set up;Sitting   Upper Body Bathing: Minimal assistance;Moderate assistance;Sitting;Cueing for compensatory techniques   Lower Body Bathing: Moderate assistance;Maximal assistance;Sitting/lateral leans;Cueing for compensatory techniques   Upper Body Dressing : Contact guard assist;Minimal assistance;Sitting;Cueing for compensatory techniques   Lower Body Dressing: Moderate assistance;Maximal assistance;Sitting/lateral leans;Cueing for compensatory techniques     Toilet Transfer Details (indicate cue type and reason): pt declined this session Toileting- Clothing Manipulation and Hygiene: Contact guard assist;Minimal assistance;Sitting/lateral lean Toileting - Clothing Manipulation Details (indicate cue type and reason):  simulated sitting at EOB       General ADL Comments: Pt with decreased activity tolerance, fatiguing quickly during  tasks     Vision         Perception         Praxis         Pertinent Vitals/Pain Pain Assessment Pain Assessment: No/denies pain     Extremity/Trunk Assessment Upper Extremity Assessment Upper Extremity Assessment: Right hand dominant;Generalized weakness;RUE deficits/detail;LUE deficits/detail RUE Deficits / Details: decreased shoulder ROM, pt reporting at baseline; generalized weakness LUE Deficits / Details: decreased shoulder ROM, pt reporting at baseline; generalized weakness   Lower Extremity Assessment Lower Extremity Assessment: Defer to PT evaluation   Cervical / Trunk Assessment Cervical / Trunk Assessment: Kyphotic   Communication Communication Communication: No apparent difficulties Factors Affecting Communication: Reduced clarity of speech (mild)   Cognition Arousal: Lethargic, Alert (Pt initally lethargic but becoming alert with activity) Behavior During Therapy: WFL for tasks assessed/performed Cognition: Cognition impaired, No family/caregiver present to determine baseline     Awareness: Intellectual awareness intact, Online awareness intact Memory impairment (select all impairments): Working Civil Service fast streamer, Conservation officer, historic buildings Attention impairment (select first level of impairment): Selective attention Executive functioning impairment (select all impairments): Problem solving, Organization OT - Cognition Comments: Pt oriented x4 and pleasant throughout session.                 Following commands: Impaired Following commands impaired: Follows one step commands with increased time, Only follows one step commands consistently     Cueing  General Comments   Cueing Techniques: Verbal cues;Tactile cues  VSS on RA throughout session. RN and NT each present during a portion of session.   Exercises     Shoulder Instructions      Home Living Family/patient expects to be discharged to:: Private residence Living Arrangements:  Spouse/significant other;Other (Comment) Available Help at Discharge: Family;Available 24 hours/day Type of Home: House Home Access: Stairs to enter;Ramped entrance Entrance Stairs-Number of Steps: 4 Entrance Stairs-Rails: Right;Left;Can reach both Home Layout: One level     Bathroom Shower/Tub: Chief Strategy Officer: Standard Bathroom Accessibility: Yes How Accessible: Accessible via wheelchair Home Equipment: None   Additional Comments: Pt reports she does not have any DME/AE. However, per chart review, in November 2024, pt reporting having a RW, shower seat, and crutches.      Prior Functioning/Environment Prior Level of Function : Needs assist             Mobility Comments: Pt states husband assists by holding her while walking ADLs Comments: Pt's husband assists her with dressing, bathing, toileting, and IADLs.    OT Problem List: Decreased strength;Decreased activity tolerance;Impaired balance (sitting and/or standing);Decreased cognition;Decreased knowledge of use of DME or AE   OT Treatment/Interventions: Self-care/ADL training;Therapeutic exercise;DME and/or AE instruction;Therapeutic activities;Cognitive remediation/compensation;Patient/family education;Balance training      OT Goals(Current goals can be found in the care plan section)   Acute Rehab OT Goals Patient Stated Goal: to return home OT Goal Formulation: With patient Time For Goal Achievement: 06/29/23 Potential to Achieve Goals: Good ADL Goals Pt Will Perform Upper Body Bathing: with contact guard assist;sitting Pt Will Perform Lower Body Bathing: with contact guard assist;sitting/lateral leans;sit to/from stand Pt Will Perform Upper Body Dressing: with set-up;sitting Pt Will Perform Lower Body Dressing: with contact guard assist;sitting/lateral leans;sit to/from stand Pt Will Transfer to Toilet: with contact guard assist;ambulating;bedside commode (with least restrictive AD) Pt Will  Perform Toileting - Clothing Manipulation  and hygiene: sitting/lateral leans;with supervision;with set-up   OT Frequency:  Min 2X/week    Co-evaluation PT/OT/SLP Co-Evaluation/Treatment: Yes Reason for Co-Treatment: For patient/therapist safety;To address functional/ADL transfers PT goals addressed during session: Mobility/safety with mobility;Balance OT goals addressed during session: ADL's and self-care      AM-PAC OT "6 Clicks" Daily Activity     Outcome Measure Help from another person eating meals?: A Little Help from another person taking care of personal grooming?: A Little Help from another person toileting, which includes using toliet, bedpan, or urinal?: A Little Help from another person bathing (including washing, rinsing, drying)?: A Lot Help from another person to put on and taking off regular upper body clothing?: A Little Help from another person to put on and taking off regular lower body clothing?: A Lot 6 Click Score: 16   End of Session Nurse Communication: Mobility status  Activity Tolerance: Patient tolerated treatment well;Patient limited by fatigue Patient left: in bed;with call bell/phone within reach;with bed alarm set  OT Visit Diagnosis: Muscle weakness (generalized) (M62.81);Other (comment);Adult, failure to thrive (R62.7) (decreased activity tolerance)                Time: 6578-4696 OT Time Calculation (min): 32 min Charges:  OT General Charges $OT Visit: 1 Visit OT Evaluation $OT Eval Low Complexity: 1 Low  Grey Schlauch "Darral Ellis., OTR/L, MA Acute Rehab [phone]   Walt Gunner 06/15/2023, 6:04 PM

## 2023-06-15 NOTE — Progress Notes (Addendum)
 Patient ID: Christina Byrd, female   DOB: 1965-05-08, 58 y.o.   MRN: [id number]    Progress Note   Subjective   Day # 3 CC;Dysphagia  Ba Swallow -swallowing appears normal, normal appearance of esophagus, poor primary peristaltic wave with incomplete esophageal emptying, tertiary contractions noted, very small hiatal hernia, evidence of spontaneous GERD with regurgitation of barium, patient refused tablet  Patient very sleepy this afternoon would not stay awake for conversation, Diet has been advanced to soft   Objective   Vital signs in last 24 hours: Temp:  [96.7 F (35.9 C)-98 F (36.7 C)] 96.7 F (35.9 C) (04/23 1442) Pulse Rate:  [84-106] 97 (04/23 1442) Resp:  [11-20] 12 (04/23 1442) BP: (96-121)/(71-96) 121/96 (04/23 1442) SpO2:  [97 %-100 %] 97 % (04/23 1442) Weight:  [47.8 kg] 47.8 kg (04/23 0334) Last BM Date : 06/14/23 General:    Older African-American in NAD edentulous Heart:  Regular rate and rhythm; no murmurs Lungs: Respirations even and unlabored, lungs CTA bilaterally Abdomen:  Soft, nontender and nondistended. Normal bowel sounds. Extremities:  Without edema. Neurologic:  Alert and oriented,  grossly normal neurologically. Psych:  Cooperative. Normal mood and affect.  Intake/Output from previous day: 04/22 0701 - 04/23 0700 In: 220.4 [IV Piggyback:220.4] Out: 400 [Blood:400] Intake/Output this shift: Total I/O In: 120.2 [IV Piggyback:120.2] Out: -   Lab Results: Recent Labs    06/13/23 1518 06/14/23 0434 06/15/23 0817 06/15/23 1222  WBC 6.1 6.4 6.7  --   HGB 8.6* 7.2* 6.7* 6.5*  HCT 25.6* 21.0* 20.1* 19.5*  PLT 192 156 175  --    BMET Recent Labs    06/14/23 1325 06/14/23 1748 06/15/23 0818  NA 138 139 142  K 3.4* 3.5 3.9  CL 106 108 109  CO2 19* 18* 19*  GLUCOSE 81 84 101*  BUN 12 14 12   CREATININE 1.34* 1.27* 1.01*  CALCIUM  6.3* 6.3* 6.8*   LFT Recent Labs    06/13/23 1518 06/14/23 0432 06/15/23 0818  PROT 6.2*  --    --   ALBUMIN 2.2*   < > 1.7*  AST 76*  --   --   ALT 38  --   --   ALKPHOS 164*  --   --   BILITOT 0.6  --   --    < > = values in this interval not displayed.   PT/INR No results for input(s): "LABPROT", "INR" in the last 72 hours.  Studies/Results: DG ESOPHAGUS W SINGLE CM (SOL OR THIN BA) Result Date: 06/15/2023 CLINICAL DATA:  58 year old with complaint of trouble swallowing. Barium swallow requested. EXAM: ESOPHAGUS/BARIUM SWALLOW/TABLET STUDY TECHNIQUE: Single contrast examination was performed using thin liquid barium. This exam was performed by Quintin Buckle PA-C, and was supervised and interpreted by Creasie Doctor, MD. FLUOROSCOPY: Radiation Exposure Index (as provided by the fluoroscopic device): 14.8 mGy Kerma COMPARISON:  None Available. FINDINGS: Swallowing: Appears normal. No vestibular penetration or aspiration seen. Pharynx: Unremarkable. Esophagus: Normal appearance. Esophageal motility: Poor primary peristaltic wave with incomplete esophageal emptying. Tertiary contractions noted Hiatal Hernia: Very small hiatal hernia with associated B-ring. Gastroesophageal reflux: Spontaneous gastroesophageal reflux with regurgitation of barium. Ingested 13mm barium tablet: Refused by patient Other: None. IMPRESSION: 1. Very small hiatal hernia with associated B-ring seen image series #6. Patient unable to take 13mm barium tablet. 2. Poor primary peristaltic wave with incomplete esophageal emptying. Teriary contractions noted. 3. Spontaneous gastroesophageal reflux with regurgitation of barium. Electronically Signed   By:  Creasie Doctor M.D.   On: 06/15/2023 14:40       Assessment / Plan:    #7 58 year old African-American female mated with primary complaint of dysphagia, complaining of difficulty with solid food and pills to the point that she was not taking her medicines for couple days prior to admission EGD November 2024 with no evidence of any esophageal abnormality   Barium swallow  today-no esophageal stricture or lesion, small hiatal hernia and evidence of esophageal dysmotility with poor primary peristaltic waves and incomplete esophageal emptying, spontaneous GERD with regurgitation of barium.  #2 EtOH abuse disorder #3 malnutrition multifactoral-patient does not have any teeth likely contributing #4 hypocalcemia #5.  Hypomagnesemia #6.  Chronic anemia #7 EtOH related neuropathy  Plan; patient needs to stay on a soft diet, avoid red meats, cut all meats into very tiny pieces, follow any bites of food with sips of liquids Upright 90 degrees for meals and for 60 to 90 minutes postprandially Head of bed elevated 45 degrees Start low-dose metoclopramide  5 mg at least 30 minutes AC Continue twice daily PPI on discharge Would encourage continued use of protein supplements on discharge  GI will sign off, available if needed    Principal Problem:   Hypocalcemia Active Problems:   Protein-calorie malnutrition, severe (HCC)   AKI (acute kidney injury) (HCC)     LOS: 2 days   Amy Esterwood PA-C 06/15/2023, 3:28 PM   Attending physician's note   I personally saw the patient and performed a substantive portion of this encounter (>50% time spent), including a complete performance of at least one of the key components (MDM, Hx and/or Exam), in conjunction with the APP.  I agree with the APP's note, impression, and recommendations with additional input as follows.     Barium esophagram showed esophageal dysmotility, small hiatal hernia and spontaneous reflux otherwise negative for any significant stricture or mass lesions Soft diet as tolerated Antireflux measures Continue PPI twice daily  Do not recommend long-term use of metoclopramide , will do a trial during hospitalization but we will avoid discharging patient tonight due to long-term potential side effects associated with it  The patient was provided an opportunity to ask questions and all were answered. The  patient agreed with the plan and demonstrated an understanding of the instructions.   Lorena Rolling , MD [phone]

## 2023-06-15 NOTE — Evaluation (Signed)
 Physical Therapy Evaluation Patient Details Name: Christina Byrd MRN: [id number] DOB: [birth year] Today's Date: 06/15/2023  History of Present Illness  58 y.o. female presents to [facility] with worsening dysphagia over 3 days. Admitted with failure to thrive in setting of chronic EtOH abuse, AKI, and hypocalcemia. 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 PTA, pt reports having difficulty ambulating with husband providing physical assistance. In today's session, pt was agreeable to sit on the EOB. She required ModA for bed mobility and to shift hips forward with the bed pad. Pt was able to sit for 10-15 minutes with CGA needed for slight posterior lean. Pt declined further transfer due to fatigue. Pt has 24/7 physical assist available upon d/c from the hospital. Pt currently with functional limitations due to the deficits listed below (see PT Problem List). Pt would benefit from acute skilled PT to address functional impairments. Recommending post-acute HHPT to work towards independence with mobility. Acute PT to follow.         If plan is discharge home, recommend the following: A lot of help with walking and/or transfers;A lot of help with bathing/dressing/bathroom;Assistance with cooking/housework;Assist for transportation;Help with stairs or ramp for entrance   Can travel by private vehicle    TBD    Equipment Recommendations Rolling walker (2 wheels);BSC/3in1;Wheelchair (measurements PT);Wheelchair cushion (measurements PT)     Functional Status Assessment Patient has had a recent decline in their functional status and demonstrates the ability to make significant improvements in function in a reasonable and predictable amount of time.     Precautions / Restrictions Precautions Precautions: Fall Restrictions Weight Bearing Restrictions Per  Provider Order: No      Mobility  Bed Mobility Overal bed mobility: Needs Assistance Bed Mobility: Supine to Sit, Sit to Supine    Supine to sit: Mod assist, HOB elevated Sit to supine: Mod assist, HOB elevated   General bed mobility comments: ModA to bring LE's off EOB and slightly raise trunk. Assist needed to shift hips forward with use of bed pad. ModA to return to supine for LE management. TotalAx2 needed to scoot towards HOB in supine    Transfers    General transfer comment: pt declined transfer        Balance Overall balance assessment: Needs assistance, Mild deficits observed, not formally tested Sitting-balance support: No upper extremity supported, Feet supported Sitting balance-Leahy Scale: Fair Sitting balance - Comments: slight posterior lean, CGA for safety     Pertinent Vitals/Pain Pain Assessment Pain Assessment: No/denies pain    Home Living Family/patient expects to be discharged to:: Private residence Living Arrangements: Spouse/significant other;Other (Comment) Available Help at Discharge: Family;Available 24 hours/day Type of Home: House Home Access: Stairs to enter;Ramped entrance Entrance Stairs-Rails: Right;Left;Can reach both Entrance Stairs-Number of Steps: 4   Home Layout: One level Home Equipment: None Additional Comments: Pt reports she does not have any DME/AE. However, per chart review, in November 2024, pt reporting having a RW, shower seat, and crutches.    Prior Function Prior Level of Function : Needs assist    Mobility Comments: Originally stated that she does not walk or stand. In later conversation, stated that her husband holds her while she walks ADLs Comments: Pt assist with dressing, bathing, toileting, and IADLs.     Extremity/Trunk Assessment   Upper Extremity Assessment Upper Extremity Assessment: Defer to OT evaluation  Lower Extremity Assessment Lower Extremity Assessment: Generalized weakness    Cervical /  Trunk Assessment Cervical / Trunk Assessment: Kyphotic  Communication   Communication Communication: No apparent difficulties    Cognition Arousal: Lethargic, Alert Behavior During Therapy: WFL for tasks assessed/performed   PT - Cognitive impairments: No family/caregiver present to determine baseline      PT - Cognition Comments: initially lethargic needing tactile stimulus to answer questions. Increased alertness throughout session Following commands: Impaired Following commands impaired: Follows one step commands with increased time, Only follows one step commands consistently     Cueing Cueing Techniques: Verbal cues, Tactile cues      PT Assessment Patient needs continued PT services  PT Problem List Decreased strength;Decreased activity tolerance;Decreased balance;Decreased mobility       PT Treatment Interventions DME instruction;Gait training;Stair training;Functional mobility training;Therapeutic activities;Therapeutic exercise;Balance training;Neuromuscular re-education;Patient/family education    PT Goals (Current goals can be found in the Care Plan section)  Acute Rehab PT Goals Patient Stated Goal: to be able to go in the kitchen and cook a meal PT Goal Formulation: With patient Time For Goal Achievement: 06/29/23 Potential to Achieve Goals: Fair    Frequency Min 2X/week     Co-evaluation PT/OT/SLP Co-Evaluation/Treatment: Yes Reason for Co-Treatment: For patient/therapist safety;To address functional/ADL transfers PT goals addressed during session: Mobility/safety with mobility;Balance OT goals addressed during session: ADL's and self-care       AM-PAC PT "6 Clicks" Mobility  Outcome Measure Help needed turning from your back to your side while in a flat bed without using bedrails?: A Lot Help needed moving from lying on your back to sitting on the side of a flat bed without using bedrails?: A Lot Help needed moving to and from a bed to a chair  (including a wheelchair)?: A Lot Help needed standing up from a chair using your arms (e.g., wheelchair or bedside chair)?: A Lot Help needed to walk in hospital room?: A Lot Help needed climbing 3-5 steps with a railing? : Total 6 Click Score: 11    End of Session   Activity Tolerance: Patient tolerated treatment well Patient left: in bed;with call bell/phone within reach;with bed alarm set Nurse Communication: Mobility status PT Visit Diagnosis: Other abnormalities of gait and mobility (R26.89);Muscle weakness (generalized) (M62.81)    Time: 7253-6644 PT Time Calculation (min) (ACUTE ONLY): 32 min   Charges:   PT Evaluation $PT Eval Low Complexity: 1 Low   PT General Charges $$ ACUTE PT VISIT: 1 Visit       Orysia Blas, PT, DPT Secure Chat Preferred  Rehab Office [phone]   Alissa April Adela Ades 06/15/2023, 4:58 PM

## 2023-06-15 NOTE — Progress Notes (Signed)
 HD#2 SUBJECTIVE:  Patient Summary: Christina Byrd is a 58 y.o. 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   Overnight Events: NAEO  Interim History: Patient was evaluated at bedside. She been able to tolerate liquids and pudding with no associated nausea or vomiting. Feels like she is overall improving but still struggling to take her PO meds.   OBJECTIVE:  Vital Signs: Vitals:   06/14/23 2300 06/15/23 0334 06/15/23 0752 06/15/23 0955  BP: 108/71 (!) 113/90 96/77   Pulse: 84 94 99 92  Resp: 15 20 20    Temp:  98 F (36.7 C) 97.6 F (36.4 C)   TempSrc:  Oral Oral   SpO2: 97% 100% 98%   Weight:  47.8 kg    Height:       Supplemental O2: Room Air SpO2: 98 %  Filed Weights   06/15/23 0334  Weight: 47.8 kg     Intake/Output Summary (Last 24 hours) at 06/15/2023 1045 Last data filed at 06/15/2023 0756 Gross per 24 hour  Intake 220.42 ml  Output 400 ml  Net -179.58 ml   Net IO Since Admission: 1,120.42 mL [06/15/23 1045]  CBC    Component Value Date/Time   WBC 6.7 06/15/2023 0817   RBC 2.20 (L) 06/15/2023 0817   HGB 6.7 (LL) 06/15/2023 0817   HGB 12.4 04/27/2022 1125   HCT 20.1 (L) 06/15/2023 0817   HCT 37.6 04/27/2022 1125   PLT 175 06/15/2023 0817   PLT 210 04/27/2022 1125   MCV 91.4 06/15/2023 0817   MCV 97 04/27/2022 1125   MCH 30.5 06/15/2023 0817   MCHC 33.3 06/15/2023 0817   RDW 20.6 (H) 06/15/2023 0817   RDW 17.0 (H) 04/27/2022 1125   LYMPHSABS 2.1 02/06/2023 1958   LYMPHSABS 1.5 04/27/2022 1125   MONOABS 0.6 02/06/2023 1958   EOSABS 0.1 02/06/2023 1958   EOSABS 0.0 04/27/2022 1125   BASOSABS 0.0 02/06/2023 1958   BASOSABS 0.0 04/27/2022 1125   CMP     Component Value Date/Time   NA 142 06/15/2023 0818   NA 144 04/27/2022 1125   K 3.9 06/15/2023 0818   CL 109 06/15/2023 0818   CO2 19  (L) 06/15/2023 0818   GLUCOSE 101 (H) 06/15/2023 0818   BUN 12 06/15/2023 0818   BUN 7 04/27/2022 1125   CREATININE 1.01 (H) 06/15/2023 0818   CREATININE 1.00 03/21/2014 1556   CALCIUM  6.8 (L) 06/15/2023 0818   PROT 6.2 (L) 06/13/2023 1518   PROT 7.1 04/27/2022 1125   ALBUMIN 1.7 (L) 06/15/2023 0818   ALBUMIN 3.5 (L) 04/27/2022 1125   AST 76 (H) 06/13/2023 1518   ALT 38 06/13/2023 1518   ALKPHOS 164 (H) 06/13/2023 1518   BILITOT 0.6 06/13/2023 1518   BILITOT 0.6 04/27/2022 1125   GFR 76.32 12/18/2012 1506   EGFR 82 04/27/2022 1125   GFRNONAA >60 06/15/2023 0818   GFRNONAA >89 12/11/2013 1214    Physical Exam: Physical Exam Constitutional:      Appearance: Normal appearance.  HENT:     Head: Normocephalic and atraumatic.  Cardiovascular:     Rate and Rhythm: Normal rate and regular rhythm.     Pulses: Normal pulses.     Heart sounds: Normal heart sounds.  Pulmonary:     Effort: Pulmonary effort is normal.     Breath sounds: Normal breath sounds.  Abdominal:     General: Abdomen is flat. Bowel sounds are normal.     Palpations: Abdomen is soft.  Neurological:     General: No focal deficit present.     Mental Status: She is alert and oriented to person, place, and time.     ASSESSMENT/PLAN:  Assessment: Principal Problem:   Hypocalcemia Active Problems:   Protein-calorie malnutrition, severe (HCC)   AKI (acute kidney injury) (HCC)   Plan: #Hypocalcemia, improving #Hypokalemia #AKI, improving Corrected calcium  mildly improved to 8.2. Creatinine also improved to 1.01, which is nearly her baseline of roughly 0.9. Evaluated by SLP yesterday who recommend a dysphagia 1/thin liquid diet due to extreme lethargy. Depending upon alertness, they also recommend to continue to progress diet as tolerated. Also evaluated by GI yesterday. EGD in April 2024 demonstrated no esophageal abnormality but noted to have had a cratered duodenal ulcer. Completed barium swallow this AM  and results pending as of this morning.  - Follow-up GI recs - Progress diet as tolerated - Trend RFP and supplement calcium  as needed - F/U barium swallow   #Alcohol Use Disorder #Hx of DT Improving. CIWA this AM is 0. She was restarted on her home Keppra . Unable to tolerate PO meds so currently on IV. Depending on her swallow evaluation today, will plan to transition to PO meds as tolerated.  - Continue Keppra  - CIWA with Ativan    Chronic Conditions: #Anemia of Chronic Disease Stable  Best Practice: Diet: Dys 1 VTE: enoxaparin  (LOVENOX ) injection 40 mg Start: 06/13/23 1800 Code: Full AB: None DISPO: Anticipated discharge  TBD  to Home pending  dysphagia .  Signature: Maxie Spaniel, MD Internal Medicine Resident, PGY-1 [facility]  Pager: [phone]  Please contact the on call pager after 5 pm and on weekends at [phone].

## 2023-06-16 ENCOUNTER — Other Ambulatory Visit: Payer: Self-pay

## 2023-06-16 DIAGNOSIS — F10930 Alcohol use, unspecified with withdrawal, uncomplicated: Secondary | ICD-10-CM

## 2023-06-16 DIAGNOSIS — D649 Anemia, unspecified: Secondary | ICD-10-CM | POA: Diagnosis not present

## 2023-06-16 LAB — RENAL FUNCTION PANEL
Albumin: 1.6 g/dL — ABNORMAL LOW (ref 3.5–5.0)
Albumin: 1.6 g/dL — ABNORMAL LOW (ref 3.5–5.0)
Albumin: <1.5 g/dL — ABNORMAL LOW (ref 3.5–5.0)
Albumin: 1.7 g/dL — ABNORMAL LOW (ref 3.5–5.0)
Anion gap: 11 (ref 5–15)
Anion gap: 10 (ref 5–15)
Anion gap: 9 (ref 5–15)
Anion gap: 11 (ref 5–15)
BUN: 8 mg/dL (ref 6–20)
BUN: 8 mg/dL (ref 6–20)
BUN: 7 mg/dL (ref 6–20)
BUN: 10 mg/dL (ref 6–20)
CO2: 17 mmol/L — ABNORMAL LOW (ref 22–32)
CO2: 17 mmol/L — ABNORMAL LOW (ref 22–32)
CO2: 16 mmol/L — ABNORMAL LOW (ref 22–32)
CO2: 14 mmol/L — ABNORMAL LOW (ref 22–32)
Calcium: 7 mg/dL — ABNORMAL LOW (ref 8.9–10.3)
Calcium: 6.8 mg/dL — ABNORMAL LOW (ref 8.9–10.3)
Calcium: 7.3 mg/dL — ABNORMAL LOW (ref 8.9–10.3)
Calcium: 7.6 mg/dL — ABNORMAL LOW (ref 8.9–10.3)
Chloride: 113 mmol/L — ABNORMAL HIGH (ref 98–111)
Chloride: 113 mmol/L — ABNORMAL HIGH (ref 98–111)
Chloride: 116 mmol/L — ABNORMAL HIGH (ref 98–111)
Chloride: 115 mmol/L — ABNORMAL HIGH (ref 98–111)
Creatinine, Ser: 0.94 mg/dL (ref 0.44–1.00)
Creatinine, Ser: 0.88 mg/dL (ref 0.44–1.00)
Creatinine, Ser: 0.85 mg/dL (ref 0.44–1.00)
Creatinine, Ser: 0.81 mg/dL (ref 0.44–1.00)
GFR, Estimated: >60 mL/min
GFR, Estimated: >60 mL/min (ref >60)
GFR, Estimated: >60 mL/min (ref >60)
GFR, Estimated: >60 mL/min (ref >60)
Glucose, Bld: 141 mg/dL — ABNORMAL HIGH (ref 70–99)
Glucose, Bld: 110 mg/dL — ABNORMAL HIGH (ref 70–99)
Glucose, Bld: 107 mg/dL — ABNORMAL HIGH (ref 70–99)
Glucose, Bld: 96 mg/dL (ref 70–99)
Phosphorus: 2.2 mg/dL — ABNORMAL LOW (ref 2.5–4.6)
Phosphorus: 2.1 mg/dL — ABNORMAL LOW (ref 2.5–4.6)
Phosphorus: 2.1 mg/dL — ABNORMAL LOW (ref 2.5–4.6)
Phosphorus: 2.6 mg/dL (ref 2.5–4.6)
Potassium: 4.2 mmol/L (ref 3.5–5.1)
Potassium: 4.1 mmol/L (ref 3.5–5.1)
Potassium: 4.1 mmol/L (ref 3.5–5.1)
Potassium: 4.9 mmol/L (ref 3.5–5.1)
Sodium: 141 mmol/L (ref 135–145)
Sodium: 140 mmol/L (ref 135–145)
Sodium: 141 mmol/L (ref 135–145)
Sodium: 140 mmol/L (ref 135–145)

## 2023-06-16 LAB — MAGNESIUM: Magnesium: 2 mg/dL (ref 1.7–2.4)

## 2023-06-16 LAB — TYPE AND SCREEN
ABO/RH(D): B NEG
Antibody Screen: NEGATIVE
Unit division: 0

## 2023-06-16 LAB — BPAM RBC
Blood Product Expiration Date: 202505282359
ISSUE DATE / TIME: 202504231419
Unit Type and Rh: 1700

## 2023-06-16 LAB — CBC
HCT: 25.8 % — ABNORMAL LOW (ref 36.0–46.0)
Hemoglobin: 8.5 g/dL — ABNORMAL LOW (ref 12.0–15.0)
MCH: 30 pg (ref 26.0–34.0)
MCHC: 32.9 g/dL (ref 30.0–36.0)
MCV: 91.2 fL (ref 80.0–100.0)
Platelets: 168 10*3/uL (ref 150–400)
RBC: 2.83 MIL/uL — ABNORMAL LOW (ref 3.87–5.11)
RDW: 18.5 % — ABNORMAL HIGH (ref 11.5–15.5)
WBC: 6.3 10*3/uL (ref 4.0–10.5)
nRBC: 0.6 % — ABNORMAL HIGH (ref 0.0–0.2)

## 2023-06-16 MED ORDER — CALCIUM GLUCONATE-NACL 2-0.675 GM/100ML-% IV SOLN
2.0000 g | Freq: Once | INTRAVENOUS | Status: AC
  Administered 2023-06-16: 2000 mg via INTRAVENOUS
  Filled 2023-06-16: qty 100

## 2023-06-16 MED ORDER — POTASSIUM & SODIUM PHOSPHATES 280-160-250 MG PO PACK
2.0000 | PACK | Freq: Three times a day (TID) | ORAL | Status: AC
  Administered 2023-06-16 (×2): 2 via ORAL
  Filled 2023-06-16 (×3): qty 2

## 2023-06-16 MED ORDER — LACTATED RINGERS IV BOLUS
1000.0000 mL | Freq: Once | INTRAVENOUS | Status: AC
  Administered 2023-06-16: 1000 mL via INTRAVENOUS

## 2023-06-16 NOTE — Plan of Care (Signed)

## 2023-06-16 NOTE — TOC Initial Note (Signed)
 Transition of Care [facility]) - Initial/Assessment Note    Patient Details  Name: Christina Byrd MRN: [id number] Date of Birth: [birth year]  Transition of Care [facility]) CM/SW Contact:    Jeani Mill, RN Phone Number: 06/16/2023, 4:00 PM  Clinical Narrative:                 Spoke to patient regarding transition needs. Patient lives with her spouse who takes her to apts.  Patient wants OP rehab instead of Home health.  Referral sent to [address]. Patient wants walker. Will order closer to discharge.  PCP confirmed.  TOC will continue to follow for needs.   Expected Discharge Plan: OP Rehab Barriers to Discharge: Continued Medical Work up   Patient Goals and CMS Choice Patient states their goals for this hospitalization and ongoing recovery are:: return home          Expected Discharge Plan and Services     Post Acute Care Choice: Durable Medical Equipment Living arrangements for the past 2 months: Single Family Home                                      Prior Living Arrangements/Services Living arrangements for the past 2 months: Single Family Home Lives with:: Spouse Patient language and need for interpreter reviewed:: Yes Do you feel safe going back to the place where you live?: Yes      Need for Family Participation in Patient Care: Yes (Comment) Care giver support system in place?: Yes (comment)   Criminal Activity/Legal Involvement Pertinent to Current Situation/Hospitalization: No - Comment as needed  Activities of Daily Living   ADL Screening (condition at time of admission) Independently performs ADLs?: Yes (appropriate for developmental age) Is the patient deaf or have difficulty hearing?: No Does the patient have difficulty seeing, even when wearing glasses/contacts?: No Does the patient have difficulty concentrating, remembering, or making decisions?: No  Permission Sought/Granted                  Emotional Assessment Appearance::  Appears stated age   Affect (typically observed): Accepting Orientation: : Oriented to Self, Oriented to Place, Oriented to  Time, Oriented to Situation Alcohol / Substance Use: Alcohol Use Psych Involvement: No (comment)  Admission diagnosis:  Hypocalcemia [E83.51] Dehydration [E86.0] Hypoalbuminemia [E88.09] Dysphagia, unspecified type [R13.10] Patient Active Problem List   Diagnosis Date Noted   AKI (acute kidney injury) (HCC) 06/13/2023   Aortic atherosclerosis (HCC) 05/26/2023   Nausea & vomiting 05/03/2023   Alcoholic peripheral neuropathy (HCC) 05/03/2023   Breakthrough seizure (HCC) 01/23/2023   Localized skin desquamation 01/06/2023   Hypothyroidism 01/06/2023   Vitamin D  deficiency 01/06/2023   CKD (chronic kidney disease) stage 3, GFR 30-59 ml/min (HCC) 01/06/2023   Chronic diarrhea 12/28/2022   Heme positive stool 12/28/2022   Protein-calorie malnutrition, severe (HCC) 12/27/2022   Electrolyte abnormality 12/26/2022   Hypocalcemia 11/19/2022   Hypoalbuminemia due to protein-calorie malnutrition (HCC) 11/19/2022   Hyperlipidemia 11/19/2022   Elevated lipase 11/19/2022   Osteoarthritis 09/02/2022   History of noncompliance with medical treatment 07/27/2022   Hepatic steatosis 07/27/2022   Acute pain of right knee 07/08/2022   Transaminitis 07/08/2022   Anemia 07/08/2022   Alcohol withdrawal syndrome without complication (HCC) 07/07/2022   Duodenal ulcer hemorrhage 06/03/2022   AVM (arteriovenous malformation) of stomach, 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   Anemia due to chronic blood loss 06/03/2022  Dysphagia 03/28/2020   Cervicalgia 03/28/2020   Hot flashes 01/30/2020   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   Depression 07/08/2016   Seizure disorder (HCC) 07/08/2016   Cocaine use    Gout of left ankle 10/09/2015   Left hip pain 09/02/2015   Colonoscopy refused 09/02/2015   Alcohol abuse 09/02/2015   Hyperuricemia 05/27/2015    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   Left shoulder pain 04/25/2015   Allergic rhinitis 05/13/2014   GERD without esophagitis 04/01/2014   Osteoarthritis of left knee 12/11/2013   PCP:  Carleen Chary, DO Pharmacy:   [id number] [place], [place] - [address] AT [facility] OF [address] [place] [address] Phone: [phone] Fax: [phone]  [id number] - [place], [place] - [address] AT [facility] OF [address] [facility] [address] [place] [address] Phone: [phone] Fax: [phone]  [facility] [address] [place] [address] Phone: [phone] Fax: [phone]     Social Drivers of Health (SDOH) Social History: SDOH Screenings   Food Insecurity: No Food Insecurity (06/14/2023)  Housing: Low Risk  (06/14/2023)  Transportation Needs: No Transportation Needs (06/14/2023)  Utilities: Not At Risk (06/14/2023)  Depression (PHQ2-9): Low Risk  (06/06/2023)  Tobacco Use: Medium Risk (06/13/2023)   SDOH Interventions:     Readmission Risk Interventions    11/21/2022    1:01 PM  Readmission Risk Prevention Plan  Transportation Screening Complete  Medication Review (RN Care Manager) Complete  PCP or Specialist appointment within 3-5 days of discharge Complete  HRI or Home Care Consult Complete  Palliative Care Screening Not Applicable  Skilled Nursing Facility Not Applicable

## 2023-06-16 NOTE — TOC Progression Note (Signed)
 Transition of Care [facility]) - Progression Note    Patient Details  Name: BRIDGITT RAGGIO MRN: [id number] Date of Birth: 1965/03/04  Transition of Care [facility]) CM/SW Contact  Jeani Mill, RN Phone Number: 06/16/2023, 3:03 PM  Clinical Narrative:    Bedside RN, Nate, requesting this RNCM not wake up patient at this time.          Expected Discharge Plan and Services                                               Social Determinants of Health (SDOH) Interventions SDOH Screenings   Food Insecurity: No Food Insecurity (06/14/2023)  Housing: Low Risk  (06/14/2023)  Transportation Needs: No Transportation Needs (06/14/2023)  Utilities: Not At Risk (06/14/2023)  Depression (PHQ2-9): Low Risk  (06/06/2023)  Tobacco Use: Medium Risk (06/13/2023)    Readmission Risk Interventions    11/21/2022    1:01 PM  Readmission Risk Prevention Plan  Transportation Screening Complete  Medication Review (RN Care Manager) Complete  PCP or Specialist appointment within 3-5 days of discharge Complete  HRI or Home Care Consult Complete  Palliative Care Screening Not Applicable  Skilled Nursing Facility Not Applicable

## 2023-06-16 NOTE — Progress Notes (Signed)
 HD#3 SUBJECTIVE:  Patient Summary: Christina Byrd is a 58 y.o. 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   Overnight Events: Tachycardic with concern for alcohol withdrawal.  Evaluated bedside around 5 AM.  Appeared comfortable and given Ativan .   Interim History: Patient was evaluated at bedside. Somnolent and minimally able to participate in assessment likely from the Ativan  she received earlier. States she was able to eat a chicken sandwich yesterday with no issues. Denies any nausea or vomiting.  OBJECTIVE:  Vital Signs: Vitals:   06/16/23 0453 06/16/23 0622 06/16/23 0633 06/16/23 0747  BP:  101/84 105/79 90/77  Pulse: (!) 128  (!) 130 (!) 125  Resp:    16  Temp:    (!) 96.9 F (36.1 C)  TempSrc:    Axillary  SpO2:    92%  Weight:      Height:       Supplemental O2: Room Air SpO2: 92 % O2 Flow Rate (L/min): 1 L/min  Filed Weights   06/15/23 0334  Weight: 47.8 kg     Intake/Output Summary (Last 24 hours) at 06/16/2023 0923 Last data filed at 06/16/2023 0748 Gross per 24 hour  Intake 722.81 ml  Output 650 ml  Net 72.81 ml   Net IO Since Admission: 1,193.23 mL [06/16/23 0923]  CBC    Component Value Date/Time   WBC 6.3 06/16/2023 0306   RBC 2.83 (L) 06/16/2023 0306   HGB 8.5 (L) 06/16/2023 0306   HGB 12.4 04/27/2022 1125   HCT 25.8 (L) 06/16/2023 0306   HCT 37.6 04/27/2022 1125   PLT 168 06/16/2023 0306   PLT 210 04/27/2022 1125   MCV 91.2 06/16/2023 0306   MCV 97 04/27/2022 1125   MCH 30.0 06/16/2023 0306   MCHC 32.9 06/16/2023 0306   RDW 18.5 (H) 06/16/2023 0306   RDW 17.0 (H) 04/27/2022 1125   LYMPHSABS 2.1 02/06/2023 1958   LYMPHSABS 1.5 04/27/2022 1125   MONOABS 0.6 02/06/2023 1958   EOSABS 0.1 02/06/2023 1958   EOSABS 0.0 04/27/2022 1125   BASOSABS 0.0 02/06/2023 1958   BASOSABS 0.0  04/27/2022 1125   CMP     Component Value Date/Time   NA 140 06/16/2023 0804   NA 144 04/27/2022 1125   K 4.1 06/16/2023 0804   CL 113 (H) 06/16/2023 0804   CO2 17 (L) 06/16/2023 0804   GLUCOSE 110 (H) 06/16/2023 0804   BUN 8 06/16/2023 0804   BUN 7 04/27/2022 1125   CREATININE 0.88 06/16/2023 0804   CREATININE 1.00 03/21/2014 1556   CALCIUM  6.8 (L) 06/16/2023 0804   PROT 6.2 (L) 06/13/2023 1518   PROT 7.1 04/27/2022 1125   ALBUMIN 1.6 (L) 06/16/2023 0804   ALBUMIN 3.5 (L) 04/27/2022 1125   AST 76 (H) 06/13/2023 1518   ALT 38 06/13/2023 1518   ALKPHOS 164 (H) 06/13/2023 1518   BILITOT 0.6 06/13/2023 1518   BILITOT 0.6 04/27/2022 1125   GFR 76.32 12/18/2012 1506   EGFR 82 04/27/2022 1125   GFRNONAA >60 06/16/2023 0804   GFRNONAA >89 12/11/2013 1214   Physical Exam Constitutional:      General: She is sleeping.     Appearance: Normal appearance.  HENT:     Head: Normocephalic and atraumatic.  Cardiovascular:     Rate and Rhythm: Regular rhythm. Tachycardia present.  Pulmonary:  Effort: Pulmonary effort is normal.     Breath sounds: Normal breath sounds.  Abdominal:     General: Abdomen is flat. Bowel sounds are normal.     Palpations: Abdomen is soft.  Neurological:     General: No focal deficit present.    ASSESSMENT/PLAN:  Assessment: Principal Problem:   Hypocalcemia Active Problems:   Protein-calorie malnutrition, severe (HCC)   AKI (acute kidney injury) (HCC)  Plan: #Hypocalcemia, improving #Hypokalemia #Hypophosphatemia #AKI, resolved Corrected calcium  8.3. Creatinine back to baseline at 0.88.  Barium swallow yesterday demonstrated normal swallowing, normal appearance of the esophagus, poor primary peristaltic wave with incomplete esophageal emptying, small hiatal hernia, and evidence of spontaneous GERD with regurgitation.  GI has signed off. Re-evaluated by speech yesterday who signed off as her swallowing difficulties fall within the realm of the  esophagus.  Diet advanced to soft yesterday which patient has tolerated well.  Electrolyte abnormalities slowly improve, and we will continue to replenish as needed. - Progress diet as tolerated - Trend RFP and supplement calcium  as needed - F/U barium swallow   #Alcohol Use Disorder #Hx of DT Concern for withdrawal as patient tachycardic with CIWA between 8 and 11.  Received Ativan  this morning but remains comfortable on examination.  Will continue to monitor for withdrawal.  Once patient's somnolence improves, will aim to resume home p.o. meds. - Resume p.o. meds as tolerated - Continue Keppra  - CIWA with Ativan    #Anemia of Chronic Disease Required unit of blood yesterday after hemoglobin dropped to 6.7.  Repeat this morning stable at 8.5.  Will continue to monitor. - Trend hemoglobin  Best Practice: Diet: Soft VTE: enoxaparin  (LOVENOX ) injection 40 mg Start: 06/13/23 1800 Code: Full AB: None DISPO: Anticipated discharge  tomorrow  to Home pending  dysphagia .  Signature: Maxie Spaniel, MD Internal Medicine Resident, PGY-1 [facility]  Pager: [phone]  Please contact the on call pager after 5 pm and on weekends at [phone].

## 2023-06-17 ENCOUNTER — Inpatient Hospital Stay (HOSPITAL_COMMUNITY)

## 2023-06-17 DIAGNOSIS — R918 Other nonspecific abnormal finding of lung field: Secondary | ICD-10-CM | POA: Diagnosis not present

## 2023-06-17 DIAGNOSIS — R062 Wheezing: Secondary | ICD-10-CM | POA: Diagnosis not present

## 2023-06-17 DIAGNOSIS — R0989 Other specified symptoms and signs involving the circulatory and respiratory systems: Secondary | ICD-10-CM | POA: Diagnosis not present

## 2023-06-17 DIAGNOSIS — J90 Pleural effusion, not elsewhere classified: Secondary | ICD-10-CM | POA: Diagnosis not present

## 2023-06-17 LAB — CBC
HCT: 28.6 % — ABNORMAL LOW (ref 36.0–46.0)
Hemoglobin: 9.3 g/dL — ABNORMAL LOW (ref 12.0–15.0)
MCH: 30.3 pg (ref 26.0–34.0)
MCHC: 32.5 g/dL (ref 30.0–36.0)
MCV: 93.2 fL (ref 80.0–100.0)
Platelets: 173 10*3/uL (ref 150–400)
RBC: 3.07 MIL/uL — ABNORMAL LOW (ref 3.87–5.11)
RDW: 19 % — ABNORMAL HIGH (ref 11.5–15.5)
WBC: 13.2 10*3/uL — ABNORMAL HIGH (ref 4.0–10.5)
nRBC: 0.3 % — ABNORMAL HIGH (ref 0.0–0.2)

## 2023-06-17 LAB — RENAL FUNCTION PANEL
Albumin: 1.7 g/dL — ABNORMAL LOW (ref 3.5–5.0)
Albumin: 1.7 g/dL — ABNORMAL LOW (ref 3.5–5.0)
Anion gap: 13 (ref 5–15)
Anion gap: 12 (ref 5–15)
BUN: 7 mg/dL (ref 6–20)
BUN: 9 mg/dL (ref 6–20)
CO2: 14 mmol/L — ABNORMAL LOW (ref 22–32)
CO2: 16 mmol/L — ABNORMAL LOW (ref 22–32)
Calcium: 7.4 mg/dL — ABNORMAL LOW (ref 8.9–10.3)
Calcium: 7.4 mg/dL — ABNORMAL LOW (ref 8.9–10.3)
Chloride: 113 mmol/L — ABNORMAL HIGH (ref 98–111)
Chloride: 113 mmol/L — ABNORMAL HIGH (ref 98–111)
Creatinine, Ser: 0.86 mg/dL (ref 0.44–1.00)
Creatinine, Ser: 0.86 mg/dL (ref 0.44–1.00)
GFR, Estimated: >60 mL/min (ref >60)
GFR, Estimated: >60 mL/min (ref >60)
Glucose, Bld: 157 mg/dL — ABNORMAL HIGH (ref 70–99)
Glucose, Bld: 121 mg/dL — ABNORMAL HIGH (ref 70–99)
Phosphorus: 3.1 mg/dL (ref 2.5–4.6)
Phosphorus: 3.2 mg/dL (ref 2.5–4.6)
Potassium: 4.6 mmol/L (ref 3.5–5.1)
Potassium: 4.4 mmol/L (ref 3.5–5.1)
Sodium: 140 mmol/L (ref 135–145)
Sodium: 141 mmol/L (ref 135–145)

## 2023-06-17 MED ORDER — CALCIUM GLUCONATE-NACL 2-0.675 GM/100ML-% IV SOLN
2.0000 g | Freq: Once | INTRAVENOUS | Status: AC
  Administered 2023-06-17: 2000 mg via INTRAVENOUS
  Filled 2023-06-17: qty 100

## 2023-06-17 MED ORDER — PANTOPRAZOLE SODIUM 40 MG PO TBEC
40.0000 mg | DELAYED_RELEASE_TABLET | Freq: Two times a day (BID) | ORAL | Status: DC
Start: 2023-06-17 — End: 2023-06-22
  Administered 2023-06-17 – 2023-06-22 (×9): 40 mg via ORAL
  Filled 2023-06-17 (×9): qty 1

## 2023-06-17 MED ORDER — LACTATED RINGERS IV BOLUS
1000.0000 mL | Freq: Once | INTRAVENOUS | Status: AC
  Administered 2023-06-17: 1000 mL via INTRAVENOUS

## 2023-06-17 MED ORDER — LEVETIRACETAM 500 MG PO TABS
750.0000 mg | ORAL_TABLET | Freq: Two times a day (BID) | ORAL | Status: DC
  Administered 2023-06-17 – 2023-06-22 (×9): 750 mg via ORAL
  Filled 2023-06-17 (×12): qty 1

## 2023-06-17 NOTE — Progress Notes (Signed)
 HD#4 SUBJECTIVE:  Patient Summary: Christina Byrd is a 58 y.o. 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   Overnight Events: Remained persistently tachycardic throughout the evening and over the course of the night. Blood pressure in the low-normal range with systolic primarily in the 90s-100s. Clinically stable. Likely secondary from hypovolemic state.   Interim History: Patient was evaluated at bedside. No episodes of diarrhea yesterday but did have some prior to admission (more than 5 per day and had a few this morning). Hasn't eaten anything today. Denies any nausea, vomiting, tremors, chills, or other symptoms.  OBJECTIVE:  Vital Signs: Vitals:   06/17/23 0405 06/17/23 0421 06/17/23 0427 06/17/23 0737  BP: 91/65 103/86  92/68  Pulse: (!) 115 (!) 139 (!) 126 (!) 112  Resp: 17 20 17 20   Temp: 98.1 F (36.7 C) 98.1 F (36.7 C)  98.4 F (36.9 C)  TempSrc: Oral Oral  Oral  SpO2: 98% 97% 96%   Weight:      Height:       Supplemental O2: Room Air SpO2: 96 % O2 Flow Rate (L/min): 1 L/min  Filed Weights   06/15/23 0334  Weight: 47.8 kg     Intake/Output Summary (Last 24 hours) at 06/17/2023 0926 Last data filed at 06/17/2023 0743 Gross per 24 hour  Intake 240 ml  Output --  Net 240 ml   Net IO Since Admission: 1,433.23 mL [06/17/23 0926]  CBC    Component Value Date/Time   WBC 13.2 (H) 06/17/2023 0249   RBC 3.07 (L) 06/17/2023 0249   HGB 9.3 (L) 06/17/2023 0249   HGB 12.4 04/27/2022 1125   HCT 28.6 (L) 06/17/2023 0249   HCT 37.6 04/27/2022 1125   PLT 173 06/17/2023 0249   PLT 210 04/27/2022 1125   MCV 93.2 06/17/2023 0249   MCV 97 04/27/2022 1125   MCH 30.3 06/17/2023 0249   MCHC 32.5 06/17/2023 0249   RDW 19.0 (H) 06/17/2023 0249   RDW 17.0 (H) 04/27/2022 1125   LYMPHSABS 2.1 02/06/2023 1958    LYMPHSABS 1.5 04/27/2022 1125   MONOABS 0.6 02/06/2023 1958   EOSABS 0.1 02/06/2023 1958   EOSABS 0.0 04/27/2022 1125   BASOSABS 0.0 02/06/2023 1958   BASOSABS 0.0 04/27/2022 1125   CMP     Component Value Date/Time   NA 140 06/17/2023 0249   NA 144 04/27/2022 1125   K 4.6 06/17/2023 0249   CL 113 (H) 06/17/2023 0249   CO2 14 (L) 06/17/2023 0249   GLUCOSE 157 (H) 06/17/2023 0249   BUN 7 06/17/2023 0249   BUN 7 04/27/2022 1125   CREATININE 0.86 06/17/2023 0249   CREATININE 1.00 03/21/2014 1556   CALCIUM  7.4 (L) 06/17/2023 0249   PROT 6.2 (L) 06/13/2023 1518   PROT 7.1 04/27/2022 1125   ALBUMIN 1.7 (L) 06/17/2023 0249   ALBUMIN 3.5 (L) 04/27/2022 1125   AST 76 (H) 06/13/2023 1518   ALT 38 06/13/2023 1518   ALKPHOS 164 (H) 06/13/2023 1518   BILITOT 0.6 06/13/2023 1518   BILITOT 0.6 04/27/2022 1125   GFR 76.32 12/18/2012 1506   EGFR 82 04/27/2022 1125   GFRNONAA >60 06/17/2023 0249   GFRNONAA >89 12/11/2013 1214   Physical Exam Constitutional:      Appearance: Normal appearance.  HENT:     Head: Normocephalic and atraumatic.  Cardiovascular:  Rate and Rhythm: Regular rhythm. Tachycardia present.     Pulses: Normal pulses.  Pulmonary:     Effort: Pulmonary effort is normal.     Breath sounds: Normal breath sounds.  Abdominal:     General: Abdomen is flat. Bowel sounds are normal.     Palpations: Abdomen is soft.  Neurological:     General: No focal deficit present.     Mental Status: She is alert. Mental status is at baseline.  Psychiatric:        Mood and Affect: Mood normal.        Behavior: Behavior normal.    ASSESSMENT/PLAN:  Assessment: Principal Problem:   Hypocalcemia Active Problems:   Alcohol withdrawal syndrome without complication (HCC)   Anemia   Protein-calorie malnutrition, severe (HCC)   AKI (acute kidney injury) (HCC)  Plan: #Hypocalcemia, improving #Hypophosphatemia, resolved #Chronic Dysphagia, improving #NAGMA Calcium  improving  this AM. Corrected value is 8.8 and will continue to replete as needed. Creatinine stable at 0.86. Continues to tolerate diet well. Bicarb remains low at 16 this morning likely from the patient's diarrhea. Will treat supportively. Tachycardia likely from hypovolemic state so will also supplement with IV fluids and continue to encourage PO intake.  - Progress diet as tolerated - Trend RFP q6hr and supplement electrolytes as needed - Encourage hydration  - Encourage ambulation  #Alcohol Use Disorder #Hx of DT Remains tachycardic but thought to have been secondary to hypovolemic state rather than withdrawal. CIWA improved to 2 and has not required Ativan . Today, will transition her IV Keppra  to PO Keppra  as she has been able to tolerate her diet advancement well.  - Resume p.o. meds as tolerated - Continue Keppra  - CIWA   #Anemia of Chronic Disease Stable. Hemoglobin 9.3 this AM. Will continue to monitor.  - Trend hemoglobin  Best Practice: Diet: Soft VTE: enoxaparin  (LOVENOX ) injection 40 mg Start: 06/13/23 1800 Code: Full AB: None DISPO: Anticipated discharge  TBD  to Home pending  dysphagia .  Signature: Maxie Spaniel, MD Internal Medicine Resident, PGY-1 [facility]  Pager: [phone]  Please contact the on call pager after 5 pm and on weekends at [phone].

## 2023-06-17 NOTE — Progress Notes (Addendum)
 Patient refusing to take Po Levetiracetam  (Keppra ) because its too big. I offered to crush medication and put it in apple sauce but she still declined. Notified attending.      1800- patient open to take to PO keppra . Patient refused to take pill again because I used chocolate pudding versus vanilla pudding.

## 2023-06-17 NOTE — Progress Notes (Signed)
 PT Cancellation Note  Patient Details Name: Christina Byrd MRN: [id number] DOB: [birth year]   Cancelled Treatment:    Reason Eval/Treat Not Completed: (P) Fatigue/lethargy limiting ability to participate;Patient declined, no reason specified, pt sleeping on arrival of x2 attempts. Unable to rouse on initial attempt, pt waking briefly on second attempt unable to maintain alertness for meaningful participation.   Beverly Buckler. PTA Acute Rehabilitation Services Office: [phone]    Agapito Horseman 06/17/2023, 3:29 PM

## 2023-06-18 DIAGNOSIS — D638 Anemia in other chronic diseases classified elsewhere: Secondary | ICD-10-CM

## 2023-06-18 DIAGNOSIS — F1090 Alcohol use, unspecified, uncomplicated: Secondary | ICD-10-CM

## 2023-06-18 DIAGNOSIS — E860 Dehydration: Principal | ICD-10-CM

## 2023-06-18 DIAGNOSIS — J690 Pneumonitis due to inhalation of food and vomit: Secondary | ICD-10-CM

## 2023-06-18 DIAGNOSIS — E872 Acidosis, unspecified: Secondary | ICD-10-CM

## 2023-06-18 DIAGNOSIS — R000 Tachycardia, unspecified: Secondary | ICD-10-CM | POA: Diagnosis not present

## 2023-06-18 DIAGNOSIS — R1310 Dysphagia, unspecified: Secondary | ICD-10-CM | POA: Diagnosis not present

## 2023-06-18 LAB — CBC WITH DIFFERENTIAL/PLATELET
Abs Immature Granulocytes: 0.13 10*3/uL — ABNORMAL HIGH (ref 0.00–0.07)
Basophils Absolute: 0.1 10*3/uL (ref 0.0–0.1)
Basophils Relative: 0 %
Eosinophils Absolute: 0 10*3/uL (ref 0.0–0.5)
Eosinophils Relative: 0 %
HCT: 26.7 % — ABNORMAL LOW (ref 36.0–46.0)
Hemoglobin: 8.6 g/dL — ABNORMAL LOW (ref 12.0–15.0)
Immature Granulocytes: 1 %
Lymphocytes Relative: 13 %
Lymphs Abs: 1.9 10*3/uL (ref 0.7–4.0)
MCH: 30.3 pg (ref 26.0–34.0)
MCHC: 32.2 g/dL (ref 30.0–36.0)
MCV: 94 fL (ref 80.0–100.0)
Monocytes Absolute: 1.4 10*3/uL — ABNORMAL HIGH (ref 0.1–1.0)
Monocytes Relative: 10 %
Neutro Abs: 11.6 10*3/uL — ABNORMAL HIGH (ref 1.7–7.7)
Neutrophils Relative %: 76 %
Platelets: 145 10*3/uL — ABNORMAL LOW (ref 150–400)
RBC: 2.84 MIL/uL — ABNORMAL LOW (ref 3.87–5.11)
RDW: 19.1 % — ABNORMAL HIGH (ref 11.5–15.5)
WBC: 15.2 10*3/uL — ABNORMAL HIGH (ref 4.0–10.5)
nRBC: 0.3 % — ABNORMAL HIGH (ref 0.0–0.2)

## 2023-06-18 LAB — RENAL FUNCTION PANEL
Albumin: 1.6 g/dL — ABNORMAL LOW (ref 3.5–5.0)
Anion gap: 12 (ref 5–15)
BUN: 9 mg/dL (ref 6–20)
CO2: 17 mmol/L — ABNORMAL LOW (ref 22–32)
Calcium: 7.7 mg/dL — ABNORMAL LOW (ref 8.9–10.3)
Chloride: 112 mmol/L — ABNORMAL HIGH (ref 98–111)
Creatinine, Ser: 0.89 mg/dL (ref 0.44–1.00)
GFR, Estimated: >60 mL/min (ref >60)
Glucose, Bld: 140 mg/dL — ABNORMAL HIGH (ref 70–99)
Phosphorus: 2.7 mg/dL (ref 2.5–4.6)
Potassium: 4.5 mmol/L (ref 3.5–5.1)
Sodium: 141 mmol/L (ref 135–145)

## 2023-06-18 MED ORDER — PIPERACILLIN-TAZOBACTAM 3.375 G IVPB
3.3750 g | Freq: Three times a day (TID) | INTRAVENOUS | Status: DC
  Administered 2023-06-18 – 2023-06-22 (×12): 3.375 g via INTRAVENOUS
  Filled 2023-06-18 (×14): qty 50

## 2023-06-18 NOTE — Plan of Care (Signed)
  Problem: Health Behavior/Discharge Planning: Goal: Ability to manage health-related needs will improve Outcome: Progressing   Problem: Clinical Measurements: Goal: Ability to maintain clinical measurements within normal limits will improve Outcome: Progressing   Problem: Clinical Measurements: Goal: Will remain free from infection Outcome: Progressing   Problem: Clinical Measurements: Goal: Diagnostic test results will improve Outcome: Progressing   

## 2023-06-18 NOTE — Progress Notes (Signed)
 Occupational Therapy Treatment Patient Details Name: Christina Byrd MRN: [id number] DOB: 02/04/1966 Today's Date: 06/18/2023   History of present illness 58 y.o. female presents to [facility] with worsening dysphagia over 3 days. Admitted with failure to thrive in setting of chronic EtOH abuse, AKI, and hypocalcemia. 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 Pt currently demonstrating ability to complete LB ADLs with largely Mod to Max assist and functional STS and step-pivot transfers with HHA +1 and Contact guard assist. Pt VSS on RA throughout session. Pt participated well in session and is making slow progress toward goals. Pt reports this session that her husband will not be able to provide 24/7 supervision/assistance when pt discharges home. Pt currently requiring 24/7 care with pt also reporting she is afraid of falling when husband is not present. Due to pt report, pt current functional level, and progress toward OT goals, OT discharge recommendation updated at this time to intensive inpatient skilled rehab services < 3 hours per day to maximize rehab potential, decrease risk of falls, and decrease risk of rehospitalization. OT also to continue following pt acutely.       If plan is discharge home, recommend the following:  A lot of help with walking and/or transfers;A lot of help with bathing/dressing/bathroom;Assistance with cooking/housework;Assist for transportation;Help with stairs or ramp for entrance   Equipment Recommendations  BSC/3in1;Other (comment) (RW)    Recommendations for Other Services      Precautions / Restrictions Precautions Precautions: Fall Restrictions Weight Bearing Restrictions Per Provider Order: No       Mobility Bed Mobility                General bed mobility comments: Pt sitting on BSC upon arrival and in recliner at end of session.    Transfers Overall transfer level: Needs assistance Equipment used: 1 person hand held assist Transfers: Sit to/from Stand, Bed to chair/wheelchair/BSC Sit to Stand: Contact guard assist     Step pivot transfers: Contact guard assist     General transfer comment: with cues for safety and hand placement/technique     Balance Overall balance assessment: Needs assistance, Mild deficits observed, not formally tested Sitting-balance support: No upper extremity supported, Feet supported Sitting balance-Leahy Scale: Fair     Standing balance support: Single extremity supported, Bilateral upper extremity supported, During functional activity Standing balance-Leahy Scale: Poor Standing balance comment: Pr requires UE support to maintain balance in static and dynamic standing                           ADL either performed or assessed with clinical judgement   ADL Overall ADL's : Needs assistance/impaired Eating/Feeding: Set up;Sitting           Lower Body Bathing: Moderate assistance;Maximal assistance;Sitting/lateral leans;Sit to/from stand;Cueing for sequencing;Cueing for safety;Cueing for compensatory techniques       Lower Body Dressing: Moderate assistance;Maximal assistance;Sitting/lateral leans;Sit to/from stand;Cueing for compensatory techniques;Cueing for safety;Cueing for sequencing   Toilet Transfer: Contact guard assist;BSC/3in1;Cueing for safety (HHA +1 and pt reaching for arm of BSC/recliner; step-pivot transfer)   Toileting- Clothing Manipulation and Hygiene: Set up;Supervision/safety;Sitting/lateral lean;Moderate assistance;Maximal assistance;Sit to/from stand;Cueing for compensatory techniques Toileting - Clothing Manipulation Details (indicate cue type and reason): Supervision/Set up for hygiene in sitting following urination. Mod-Max assist  for clothing  management and peri care following BM in standing.       General ADL Comments: Pt with mildly improved activity tolerance and increased alertness this session as compared to OT [facility] but with pt continuing to fatigue quickly during functional tasks.    Extremity/Trunk Assessment Upper Extremity Assessment Upper Extremity Assessment: Right hand dominant;Generalized weakness;RUE deficits/detail;LUE deficits/detail RUE Deficits / Details: decreased shoulder ROM, pt reporting at baseline; generalized weakness LUE Deficits / Details: decreased shoulder ROM, pt reporting at baseline; generalized weakness   Lower Extremity Assessment Lower Extremity Assessment: Defer to PT evaluation        Vision       Perception     Praxis     Communication Communication Communication: No apparent difficulties Factors Affecting Communication: Reduced clarity of speech (mild)   Cognition Arousal: Alert Behavior During Therapy: WFL for tasks assessed/performed Cognition: Cognition impaired, No family/caregiver present to determine baseline     Awareness: Intellectual awareness intact, Online awareness intact Memory impairment (select all impairments): Working [employer] fast streamer, Conservation officer, historic buildings Attention impairment (select first level of impairment): Selective attention Executive functioning impairment (select all impairments): Problem solving, Organization, Reasoning OT - Cognition Comments: Pt oriented x4 and pleasant throughout session.                 Following commands: Impaired Following commands impaired: Follows one step commands with increased time, Only follows one step commands consistently, Follows multi-step commands inconsistently      Cueing   Cueing Techniques: Verbal cues, Tactile cues, Visual cues  Exercises      Shoulder Instructions       General Comments VSS on RA throughout session.    Pertinent Vitals/ Pain       Pain Assessment Pain Assessment:  Faces Faces Pain Scale: Hurts little more Pain Location: buttocks, generalized Pain Descriptors / Indicators: Aching, Grimacing, Guarding, Sore Pain Intervention(s): Limited activity within patient's tolerance, Monitored during session, Repositioned  Home Living                                          Prior Functioning/Environment              Frequency  Min 2X/week        Progress Toward Goals  OT Goals(current goals can now be found in the care plan section)  Progress towards OT goals: Progressing toward goals  Acute Rehab OT Goals Patient Stated Goal: to go to SNF for rehab after discharge form hospital  Plan      Co-evaluation                 AM-PAC OT "6 Clicks" Daily Activity     Outcome Measure   Help from another person eating meals?: A Little Help from another person taking care of personal grooming?: A Little Help from another person toileting, which includes using toliet, bedpan, or urinal?: A Lot Help from another person bathing (including washing, rinsing, drying)?: A Lot Help from another person to put on and taking off regular upper body clothing?: A Little Help from another person to put on and taking off regular lower body clothing?: A Lot 6 Click Score: 15    End of Session    OT Visit Diagnosis: Muscle weakness (generalized) (M62.81);Other (comment);Adult, failure to thrive (R62.7) (decreased activity tolerance)   Activity Tolerance Patient tolerated treatment well   Patient  Left in chair;with call bell/phone within reach;with chair alarm set   Nurse Communication Mobility status;Other (comment) (Pt now requesting SNF and reporting her husband is not able to provide 24/7 care upon discharge with Case Manager and Social Worker also notified)        Time: 1610-9604 OT Time Calculation (min): 34 min  Charges: OT General Charges $OT Visit: 1 Visit OT Treatments $Self Care/Home Management : 23-37 mins  Sunshyne Horvath  "Darral Ellis., OTR/L, MA Acute Rehab [phone]   Walt Gunner 06/18/2023, 5:40 PM

## 2023-06-18 NOTE — Progress Notes (Signed)
 HD#5 SUBJECTIVE:  Patient Summary: Christina Byrd is a 58 y.o. 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   Overnight Events: No overnight events  Interim History: Patient was evaluated at bedside this AM. She is sleeping, but able to nod her head yes or no to questions. Nods "yes" when asked if she is having trouble breathing.  OBJECTIVE:  Vital Signs: Vitals:   06/17/23 1528 06/17/23 1939 06/18/23 0000 06/18/23 0403  BP: 97/74 109/66 96/83 102/69  Pulse: (!) 114 (!) 120 (!) 118 (!) 130  Resp: 20 18 20 20   Temp: 98 F (36.7 C) 98.1 F (36.7 C) 98.3 F (36.8 C) 98.9 F (37.2 C)  TempSrc: Axillary Oral Oral Oral  SpO2: 95% 94% 95% 95%  Weight:      Height:       Supplemental O2: Room Air SpO2: 95 % O2 Flow Rate (L/min): 1 L/min  Filed Weights   06/15/23 0334  Weight: 47.8 kg     Intake/Output Summary (Last 24 hours) at 06/18/2023 0635 Last data filed at 06/18/2023 0100 Gross per 24 hour  Intake 360 ml  Output 500 ml  Net -140 ml   Net IO Since Admission: 1,053.23 mL [06/18/23 0635]  CBC    Component Value Date/Time   WBC 15.2 (H) 06/18/2023 0236   RBC 2.84 (L) 06/18/2023 0236   HGB 8.6 (L) 06/18/2023 0236   HGB 12.4 04/27/2022 1125   HCT 26.7 (L) 06/18/2023 0236   HCT 37.6 04/27/2022 1125   PLT 145 (L) 06/18/2023 0236   PLT 210 04/27/2022 1125   MCV 94.0 06/18/2023 0236   MCV 97 04/27/2022 1125   MCH 30.3 06/18/2023 0236   MCHC 32.2 06/18/2023 0236   RDW 19.1 (H) 06/18/2023 0236   RDW 17.0 (H) 04/27/2022 1125   LYMPHSABS 1.9 06/18/2023 0236   LYMPHSABS 1.5 04/27/2022 1125   MONOABS 1.4 (H) 06/18/2023 0236   EOSABS 0.0 06/18/2023 0236   EOSABS 0.0 04/27/2022 1125   BASOSABS 0.1 06/18/2023 0236   BASOSABS 0.0 04/27/2022 1125   CMP     Component Value Date/Time   NA 141 06/18/2023 0236    NA 144 04/27/2022 1125   K 4.5 06/18/2023 0236   CL 112 (H) 06/18/2023 0236   CO2 17 (L) 06/18/2023 0236   GLUCOSE 140 (H) 06/18/2023 0236   BUN 9 06/18/2023 0236   BUN 7 04/27/2022 1125   CREATININE 0.89 06/18/2023 0236   CREATININE 1.00 03/21/2014 1556   CALCIUM  7.7 (L) 06/18/2023 0236   PROT 6.2 (L) 06/13/2023 1518   PROT 7.1 04/27/2022 1125   ALBUMIN 1.6 (L) 06/18/2023 0236   ALBUMIN 3.5 (L) 04/27/2022 1125   AST 76 (H) 06/13/2023 1518   ALT 38 06/13/2023 1518   ALKPHOS 164 (H) 06/13/2023 1518   BILITOT 0.6 06/13/2023 1518   BILITOT 0.6 04/27/2022 1125   GFR 76.32 12/18/2012 1506   EGFR 82 04/27/2022 1125   GFRNONAA >60 06/18/2023 0236   GFRNONAA >89 12/11/2013 1214   Physical Exam Constitutional:      Appearance: Normal appearance.  HENT:     Head: Normocephalic and atraumatic.  Cardiovascular:     Rate and Rhythm: Regular rhythm. Tachycardia present.     Pulses: Normal pulses.  Pulmonary:     Effort: Pulmonary effort is normal.     Breath sounds: Normal  breath sounds.  Abdominal:     General: Abdomen is flat. Bowel sounds are normal.     Palpations: Abdomen is soft.  Neurological:     General: No focal deficit present.     Mental Status: She is alert. Mental status is at baseline.  Psychiatric:        Mood and Affect: Mood normal.        Behavior: Behavior normal.    ASSESSMENT/PLAN:  Assessment: Principal Problem:   Hypocalcemia Active Problems:   Alcohol withdrawal syndrome without complication (HCC)   Anemia   Protein-calorie malnutrition, severe (HCC)   AKI (acute kidney injury) (HCC)  Plan: #Hypocalcemia, improving #Hypophosphatemia, resolved #Chronic Dysphagia, improving #NAGMA Calcium  improving this AM. Corrected value is 9.6 and will continue to replete as needed. Creatinine stable at 0.86. Continues to tolerate diet well. Bicarb remains low at 17 this morning likely from the patient's diarrhea. Will treat supportively. - Progress diet as  tolerated - Daily RFPs - Encourage hydration  - Encourage ambulation   #Tachycardia  Etiology currently unclear.  EKG shows sinus tachycardia.  Initially was thought to be due to to hypovolemia from poor p.o. intake, and chronic diarrhea however she has been given multiple boluses and tachycardia still is not resolving.  Also considered alcohol withdrawal, however now she is outside of the window.  Historically, she runs in the 90-100 range.  There was some concern for shortness of breath, as well as expiratory wheezes yesterday, so chest x-ray was obtained which shows bilateral atelectasis, however cannot rule out left lower lobe pneumonia.  She is very high risk for aspiration given her chronic dysphagia, and the timeline matches up with aspiration pneumonia as well, with increasing white blood cell count as well today to 15.6 we we will initiate treatment for aspiration pneumonia with IV Zosyn.  Plan: - Start Zosyn  #Alcohol Use Disorder #Hx of DT Remains tachycardic but thought to have been secondary to hypovolemic state rather than withdrawal. CIWA improved to 2 and has not required Ativan .  Transitioned to p.o. Keppra  yesterday, however patient refused to take it.  Will reevaluate her stance on this, may need to switch back to IV. - Resume p.o. meds as tolerated - Continue Keppra  - CIWA   #Anemia of Chronic Disease Stable. Hemoglobin 8.6this AM. Will continue to monitor.  - Trend hemoglobin  Best Practice: Diet: Soft VTE: enoxaparin  (LOVENOX ) injection 40 mg Start: 06/13/23 1800 Code: Full AB: None DISPO: Anticipated discharge  TBD  to Home pending  dysphagia .  Signature: Gordy Goar, MD Internal Medicine Resident, PGY-2 [facility]  Pager: [phone]  Please contact the on call pager after 5 pm and on weekends at [phone].

## 2023-06-19 DIAGNOSIS — R000 Tachycardia, unspecified: Secondary | ICD-10-CM | POA: Diagnosis not present

## 2023-06-19 DIAGNOSIS — R1310 Dysphagia, unspecified: Secondary | ICD-10-CM | POA: Diagnosis not present

## 2023-06-19 DIAGNOSIS — F1090 Alcohol use, unspecified, uncomplicated: Secondary | ICD-10-CM | POA: Diagnosis not present

## 2023-06-19 LAB — CBC
HCT: 23.6 % — ABNORMAL LOW (ref 36.0–46.0)
Hemoglobin: 7.7 g/dL — ABNORMAL LOW (ref 12.0–15.0)
MCH: 30.6 pg (ref 26.0–34.0)
MCHC: 32.6 g/dL (ref 30.0–36.0)
MCV: 93.7 fL (ref 80.0–100.0)
Platelets: 145 10*3/uL — ABNORMAL LOW (ref 150–400)
RBC: 2.52 MIL/uL — ABNORMAL LOW (ref 3.87–5.11)
RDW: 19 % — ABNORMAL HIGH (ref 11.5–15.5)
WBC: 11.6 10*3/uL — ABNORMAL HIGH (ref 4.0–10.5)
nRBC: 0.3 % — ABNORMAL HIGH (ref 0.0–0.2)

## 2023-06-19 LAB — RENAL FUNCTION PANEL
Albumin: 1.5 g/dL — ABNORMAL LOW (ref 3.5–5.0)
Anion gap: 12 (ref 5–15)
BUN: 11 mg/dL (ref 6–20)
CO2: 15 mmol/L — ABNORMAL LOW (ref 22–32)
Calcium: 7.5 mg/dL — ABNORMAL LOW (ref 8.9–10.3)
Chloride: 113 mmol/L — ABNORMAL HIGH (ref 98–111)
Creatinine, Ser: 1 mg/dL (ref 0.44–1.00)
GFR, Estimated: >60 mL/min (ref >60)
Glucose, Bld: 137 mg/dL — ABNORMAL HIGH (ref 70–99)
Phosphorus: 2.7 mg/dL (ref 2.5–4.6)
Potassium: 4.2 mmol/L (ref 3.5–5.1)
Sodium: 140 mmol/L (ref 135–145)

## 2023-06-19 LAB — BLOOD GAS, VENOUS
Acid-base deficit: 6.4 mmol/L — ABNORMAL HIGH (ref 0.0–2.0)
Bicarbonate: 20.2 mmol/L (ref 20.0–28.0)
O2 Saturation: 33.6 %
Patient temperature: 36.6
pCO2, Ven: 42 mmHg — ABNORMAL LOW (ref 44–60)
pH, Ven: 7.29 (ref 7.25–7.43)
pO2, Ven: <31 mmHg — CL (ref 32–45)

## 2023-06-19 MED ORDER — IPRATROPIUM-ALBUTEROL 0.5-2.5 (3) MG/3ML IN SOLN
3.0000 mL | Freq: Four times a day (QID) | RESPIRATORY_TRACT | Status: DC | PRN
  Filled 2023-06-19: qty 3

## 2023-06-19 MED ORDER — LOPERAMIDE HCL 2 MG PO CAPS
4.0000 mg | ORAL_CAPSULE | Freq: Every day | ORAL | Status: DC
  Administered 2023-06-19 – 2023-06-22 (×4): 4 mg via ORAL
  Filled 2023-06-19 (×4): qty 2

## 2023-06-19 MED ORDER — ACETAMINOPHEN 160 MG/5ML PO SOLN
650.0000 mg | Freq: Four times a day (QID) | ORAL | Status: DC | PRN
  Administered 2023-06-19 – 2023-06-20 (×2): 650 mg via ORAL
  Filled 2023-06-19 (×2): qty 20.3

## 2023-06-19 MED ORDER — ACETAMINOPHEN 160 MG/5ML PO SOLN
ORAL | Status: AC
  Administered 2023-06-19: 650 mg
  Filled 2023-06-19: qty 20.3

## 2023-06-19 NOTE — Progress Notes (Addendum)
 HD#6 SUBJECTIVE:  Patient Summary: Christina Byrd is a 58 y.o. 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   Overnight Events: No overnight events  Interim History: Patient was evaluated at bedside. Had over 5 episodes of diarrhea yesterday. Able to eat two chicken sandwiches yesterday but still having issues with her oral meds, even when they are crushed up. Endorses wheezing at night that began a couple nights ago but has improved since onset of antibiotics. No associated orthopnea or shortness of breath. Overall feels like she is continuing to improve.  OBJECTIVE:  Vital Signs: Vitals:   06/18/23 2301 06/19/23 0344 06/19/23 0535 06/19/23 0710  BP: 98/81  105/74   Pulse: (!) 101  (!) 110 (!) 111  Resp: 19 18 19 19   Temp: 98.1 F (36.7 C) 98.1 F (36.7 C) 98.1 F (36.7 C)   TempSrc: Oral  Oral Oral  SpO2: 92%  95%   Weight:      Height:       Supplemental O2: Room Air SpO2: 95 % O2 Flow Rate (L/min): 1 L/min  Filed Weights   06/15/23 0334  Weight: 47.8 kg     Intake/Output Summary (Last 24 hours) at 06/19/2023 0759 Last data filed at 06/19/2023 0400 Gross per 24 hour  Intake 99.95 ml  Output --  Net 99.95 ml   Net IO Since Admission: 1,273.18 mL [06/19/23 0759]  CBC    Component Value Date/Time   WBC 11.6 (H) 06/19/2023 0237   RBC 2.52 (L) 06/19/2023 0237   HGB 7.7 (L) 06/19/2023 0237   HGB 12.4 04/27/2022 1125   HCT 23.6 (L) 06/19/2023 0237   HCT 37.6 04/27/2022 1125   PLT 145 (L) 06/19/2023 0237   PLT 210 04/27/2022 1125   MCV 93.7 06/19/2023 0237   MCV 97 04/27/2022 1125   MCH 30.6 06/19/2023 0237   MCHC 32.6 06/19/2023 0237   RDW 19.0 (H) 06/19/2023 0237   RDW 17.0 (H) 04/27/2022 1125   LYMPHSABS 1.9 06/18/2023 0236   LYMPHSABS 1.5 04/27/2022 1125   MONOABS 1.4 (H) 06/18/2023 0236   EOSABS  0.0 06/18/2023 0236   EOSABS 0.0 04/27/2022 1125   BASOSABS 0.1 06/18/2023 0236   BASOSABS 0.0 04/27/2022 1125   CMP     Component Value Date/Time   NA 140 06/19/2023 0237   NA 144 04/27/2022 1125   K 4.2 06/19/2023 0237   CL 113 (H) 06/19/2023 0237   CO2 15 (L) 06/19/2023 0237   GLUCOSE 137 (H) 06/19/2023 0237   BUN 11 06/19/2023 0237   BUN 7 04/27/2022 1125   CREATININE 1.00 06/19/2023 0237   CREATININE 1.00 03/21/2014 1556   CALCIUM  7.5 (L) 06/19/2023 0237   PROT 6.2 (L) 06/13/2023 1518   PROT 7.1 04/27/2022 1125   ALBUMIN 1.5 (L) 06/19/2023 0237   ALBUMIN 3.5 (L) 04/27/2022 1125   AST 76 (H) 06/13/2023 1518   ALT 38 06/13/2023 1518   ALKPHOS 164 (H) 06/13/2023 1518   BILITOT 0.6 06/13/2023 1518   BILITOT 0.6 04/27/2022 1125   GFR 76.32 12/18/2012 1506   EGFR 82 04/27/2022 1125   GFRNONAA >60 06/19/2023 0237   GFRNONAA >89 12/11/2013 1214   Physical Exam Constitutional:      Appearance: Normal appearance.  HENT:     Head: Normocephalic and atraumatic.  Cardiovascular:     Rate and Rhythm:  Regular rhythm. Tachycardia present.     Pulses: Normal pulses.  Pulmonary:     Effort: Pulmonary effort is normal.     Breath sounds: Normal breath sounds.  Abdominal:     General: Abdomen is flat. Bowel sounds are normal.     Palpations: Abdomen is soft.  Neurological:     General: No focal deficit present.     Mental Status: She is alert. Mental status is at baseline.  Psychiatric:        Mood and Affect: Mood normal.        Behavior: Behavior normal.    ASSESSMENT/PLAN:  Assessment: Principal Problem:   Hypocalcemia Active Problems:   Alcohol withdrawal syndrome without complication (HCC)   Anemia   Protein-calorie malnutrition, severe (HCC)   AKI (acute kidney injury) (HCC)   Dehydration   Aspiration pneumonia of left lower lobe (HCC)  Plan: #Hypocalcemia, improving #Chronic Dysphagia, improving #NAGMA Calcium  stable this AM with corrected value of 9.1  so will continue to replenish as needed. Creatinine stable at 1.00. Bicarb stable at 15. Will continue to treat supportively and advance diet as tolerated. Evaluated by PT/OT who are recommending SNF. Patient is amenable to this plan for continued improvement in strength prior to going home.  - Progress diet as tolerated - Daily RFPs - Encourage hydration  - Encourage ambulation  #Tachycardia, improving   #Aspiration PNA Etiology unclear but improving. Initially was thought to be due to to hypovolemia from poor p.o. intake, and chronic diarrhea however she has been given multiple boluses and tachycardia still is not resolving.  Also considered alcohol withdrawal, however now she is outside of the window.  Historically, she runs in the 90-100 range.  There was some concern for shortness of breath, as well as expiratory wheezes, so chest x-ray was obtained which showed bilateral atelectasis; however could not rule out left lower lobe pneumonia.  She is very high risk for aspiration given her chronic dysphagia, and the timeline matches up with aspiration pneumonia as well. WBC improving from 15.2 yesterday to 11.6 this morning. Will continue on Zosyn will plan to transition to PO prior to discharge.   Plan: - Continue Zosyn  #Alcohol Use Disorder #Hx of DT Tachycardia improving. Tolerating PO Keppra  yesterday. Not given PM dose yesterday, so if she continues to refuse, may need to switch back to IV. Will consult with pharmacy for possible alternatives but options limited.  - Resume p.o. meds as tolerated - Continue Keppra  - CIWA   #Anemia of Chronic Disease Slightly lower this morning but overall stable. Hemoglobin 7.7 this AM. Will continue to monitor.  - Trend hemoglobin  Best Practice: Diet: Soft VTE: enoxaparin  (LOVENOX ) injection 40 mg Start: 06/13/23 1800 Code: Full AB: Zosyn DISPO: Anticipated discharge  tomorrow  to SNF pending  dysphagia .  Signature: Maxie Spaniel, MD Internal  Medicine Resident, PGY-1 [facility]   Please contact the on call pager after 5 pm and on weekends at [phone].

## 2023-06-19 NOTE — Plan of Care (Signed)
  Problem: Health Behavior/Discharge Planning: Goal: Ability to identify changes in lifestyle to reduce recurrence of condition will improve Outcome: Progressing   Problem: Safety: Goal: Ability to remain free from injury will improve Outcome: Progressing

## 2023-06-19 NOTE — [place] FL2 (Signed)
 [place]  MEDICAID FL2 LEVEL OF CARE FORM     IDENTIFICATION  Patient Name: Christina Byrd Birthdate: [birth year] Sex: female Admission Date (Current Location): 06/13/2023  [facility] and [place] Number:  Producer, television/film/video and Address:  [facility], [address] [place], [place] [address]      Provider Number: [id number]  Attending Physician Name and Address:  Cherylene Corrente, MD  Relative Name and Phone Number:  Moore,Clayton Significant other [phone]    Current Level of Care: Hospital Recommended Level of Care: Skilled Nursing Facility Prior Approval Number:    Date Approved/Denied:   PASRR Number: [id number]  Discharge Plan: SNF    Current Diagnoses: Patient Active Problem List   Diagnosis Date Noted   Dehydration 06/18/2023   Aspiration pneumonia of left lower lobe (HCC) 06/18/2023   AKI (acute kidney injury) (HCC) 06/13/2023   Aortic atherosclerosis (HCC) 05/26/2023   Nausea & vomiting 05/03/2023   Alcoholic peripheral neuropathy (HCC) 05/03/2023   Breakthrough seizure (HCC) 01/23/2023   Localized skin desquamation 01/06/2023   Hypothyroidism 01/06/2023   Vitamin D  deficiency 01/06/2023   CKD (chronic kidney disease) stage 3, GFR 30-59 ml/min (HCC) 01/06/2023   Chronic diarrhea 12/28/2022   Heme positive stool 12/28/2022   Protein-calorie malnutrition, severe (HCC) 12/27/2022   Electrolyte abnormality 12/26/2022   Hypocalcemia 11/19/2022   Hypoalbuminemia due to protein-calorie malnutrition (HCC) 11/19/2022   Hyperlipidemia 11/19/2022   Elevated lipase 11/19/2022   Osteoarthritis 09/02/2022   History of noncompliance with medical treatment 07/27/2022   Hepatic steatosis 07/27/2022   Acute pain of right knee 07/08/2022   Transaminitis 07/08/2022   Anemia 07/08/2022   Alcohol withdrawal syndrome without complication (HCC) 07/07/2022   Duodenal ulcer hemorrhage 06/03/2022   AVM (arteriovenous malformation) of  stomach, 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   Anemia due to chronic blood loss 06/03/2022   Dysphagia 03/28/2020   Cervicalgia 03/28/2020   Hot flashes 01/30/2020   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   Depression 07/08/2016   Seizure disorder (HCC) 07/08/2016   Cocaine use    Gout of left ankle 10/09/2015   Left hip pain 09/02/2015   Colonoscopy refused 09/02/2015   Alcohol abuse 09/02/2015   Hyperuricemia 05/27/2015   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   Left shoulder pain 04/25/2015   Allergic rhinitis 05/13/2014   GERD without esophagitis 04/01/2014   Osteoarthritis of left knee 12/11/2013    Orientation RESPIRATION BLADDER Height & Weight     Self, Time, Place  Normal Continent Weight: 105 lb 6.1 oz (47.8 kg) Height:  5\' 2"  (157.5 cm)  BEHAVIORAL SYMPTOMS/MOOD NEUROLOGICAL BOWEL NUTRITION STATUS      Continent Diet (See dc suammry)  AMBULATORY STATUS COMMUNICATION OF NEEDS Skin   Extensive Assist Verbally PU Stage and Appropriate Care (Wound / Incision (Open or Dehisced) 12/23/22 Irritant Dermatitis (Moisture Associated Skin Damage) Other (Comment) Right;Left)                       Personal Care Assistance Level of Assistance  Bathing, Feeding, Dressing, Total care Bathing Assistance: Maximum assistance Feeding assistance: Limited assistance Dressing Assistance: Maximum assistance Total Care Assistance: Maximum assistance   Functional Limitations Info  Sight, Hearing, Speech Sight Info: Impaired Hearing Info: Adequate Speech Info: Adequate    SPECIAL CARE FACTORS FREQUENCY  PT (By licensed PT), OT (By licensed OT)     PT Frequency: 5x weekly OT Frequency: 5x weekly  Contractures      Additional Factors Info  Code Status, Allergies Code Status Info: Full Allergies Info: Losartan  Potassium;Levaquin  (levofloxacin  In D5w);Ace Inhibitors;Codeine ;Cefepime ;Chlorhexidine;Vancomycin             Current Medications (06/19/2023):  This is the current hospital active medication list Current Facility-Administered Medications  Medication Dose Route Frequency Provider Last Rate Last Admin   acetaminophen  (TYLENOL ) 160 MG/5ML solution 650 mg  650 mg Oral Q6H PRN Zheng, Michael, DO       enoxaparin  (LOVENOX ) injection 40 mg  40 mg Subcutaneous Q24H Nooruddin, Saad, MD   40 mg at 06/19/23 0005   feeding supplement (ENSURE ENLIVE / ENSURE PLUS) liquid 237 mL  237 mL Oral TID BM Sandie Cross, MD   237 mL at 06/17/23 4098   fluocinonide -emollient (LIDEX -E) 0.05 % cream   Topical Daily Nooruddin, Saad, MD   Given at 06/19/23 1191   folic acid  (FOLVITE ) tablet 1 mg  1 mg Oral Daily Nooruddin, Saad, MD   1 mg at 06/19/23 4782   levETIRAcetam  (KEPPRA ) tablet 750 mg  750 mg Oral Q12H HoffmanYgnacio Hemming C, DO   750 mg at 06/19/23 9562   lidocaine  (LIDODERM ) 5 % 1 patch  1 patch Transdermal Q24H Nooruddin, Saad, MD   1 patch at 06/16/23 1118   loperamide  (IMODIUM ) capsule 2 mg  2 mg Oral PRN Ghiles, Connor, MD   2 mg at 06/19/23 1308   metoCLOPramide  (REGLAN ) tablet 5 mg  5 mg Oral TID AC Esterwood, Amy S, PA-C   5 mg at 06/19/23 0602   multivitamin with minerals tablet 1 tablet  1 tablet Oral Daily Nooruddin, Saad, MD   1 tablet at 06/19/23 0933   pantoprazole  (PROTONIX ) EC tablet 40 mg  40 mg Oral BID Tod Forward C, DO   40 mg at 06/19/23 0933   piperacillin-tazobactam (ZOSYN) IVPB 3.375 g  3.375 g Intravenous Q8H Nooruddin, Saad, MD 12.5 mL/hr at 06/19/23 0605 3.375 g at 06/19/23 6578   thiamine  (VITAMIN B1) tablet 100 mg  100 mg Oral Daily Nooruddin, Saad, MD   100 mg at 06/19/23 4696   Or   thiamine  (VITAMIN B1) injection 100 mg  100 mg Intravenous Daily Nooruddin, Saad, MD   100 mg at 06/15/23 2952     Discharge Medications: Please see discharge summary for a list of discharge medications.  Relevant Imaging Results:  Relevant Lab Results:   Additional Information SSN #:  [id number]  Murphy Arn, LCSWA

## 2023-06-19 NOTE — Progress Notes (Signed)
 Attempted to change IV dressing because it was soiled, resulted in loss of IV access, pt refused any further attempts in order to obtain access. Pt stated, "I don't want to be stuck anymore". Pt educated on the importance of completing her IV antibiotics, pt is still refusing.   Pt had some inspiratory wheezing, attempted to notify Duncan Gibson, MD of both instances. See new orders.  Sonjia Durie, RN

## 2023-06-19 NOTE — TOC Progression Note (Addendum)
 Transition of Care [facility]) - Progression Note    Patient Details  Name: Christina Byrd MRN: [id number] Date of Birth: Feb 12, 1966  [facility] CM/SW Contact  Ernst Heap Phone Number: [phone] 06/19/2023, 11:38 AM  Clinical Narrative:  CSW met with patient at bedside. CSW addressed SNF consult. Patient is agreeable to SNF and being faxed out. Patient stated that her husband leaves her home alone all day and she is afraid of falling with no one being able to help her up. Patient stated that she also has to hold bowel movements and micturating. Patient CANNOT read. CSW explained that the unit CSW will review bed offers with her and read Medicare.gov rating so she can make the best decision for herself.   CSW completed passr, completed FL2, and faxed out to SNF- offers pending.   TOC will continue following.      Expected Discharge Plan: OP Rehab Barriers to Discharge: Continued Medical Work up  Expected Discharge Plan and Services     Post Acute Care Choice: Durable Medical Equipment Living arrangements for the past 2 months: Single Family Home                                       Social Determinants of Health (SDOH) Interventions SDOH Screenings   Food Insecurity: No Food Insecurity (06/14/2023)  Housing: Low Risk  (06/14/2023)  Transportation Needs: No Transportation Needs (06/14/2023)  Utilities: Not At Risk (06/14/2023)  Depression (PHQ2-9): Low Risk  (06/06/2023)  Tobacco Use: Medium Risk (06/13/2023)    Readmission Risk Interventions    11/21/2022    1:01 PM  Readmission Risk Prevention Plan  Transportation Screening Complete  Medication Review (RN Care Manager) Complete  PCP or Specialist appointment within 3-5 days of discharge Complete  HRI or Home Care Consult Complete  Palliative Care Screening Not Applicable  Skilled Nursing Facility Not Applicable

## 2023-06-19 NOTE — Plan of Care (Signed)
   Problem: Health Behavior/Discharge Planning: Goal: Ability to manage health-related needs will improve Outcome: Progressing   Problem: Clinical Measurements: Goal: Ability to maintain clinical measurements within normal limits will improve Outcome: Progressing

## 2023-06-20 DIAGNOSIS — D649 Anemia, unspecified: Secondary | ICD-10-CM

## 2023-06-20 DIAGNOSIS — R1310 Dysphagia, unspecified: Secondary | ICD-10-CM | POA: Diagnosis not present

## 2023-06-20 DIAGNOSIS — K529 Noninfective gastroenteritis and colitis, unspecified: Secondary | ICD-10-CM

## 2023-06-20 DIAGNOSIS — G621 Alcoholic polyneuropathy: Secondary | ICD-10-CM

## 2023-06-20 DIAGNOSIS — Z8639 Personal history of other endocrine, nutritional and metabolic disease: Secondary | ICD-10-CM

## 2023-06-20 DIAGNOSIS — E46 Unspecified protein-calorie malnutrition: Secondary | ICD-10-CM | POA: Diagnosis not present

## 2023-06-20 LAB — RENAL FUNCTION PANEL
Albumin: <1.5 g/dL — ABNORMAL LOW (ref 3.5–5.0)
Anion gap: 9 (ref 5–15)
BUN: 12 mg/dL (ref 6–20)
CO2: 18 mmol/L — ABNORMAL LOW (ref 22–32)
Calcium: 7.2 mg/dL — ABNORMAL LOW (ref 8.9–10.3)
Chloride: 112 mmol/L — ABNORMAL HIGH (ref 98–111)
Creatinine, Ser: 0.9 mg/dL (ref 0.44–1.00)
GFR, Estimated: >60 mL/min (ref >60)
Glucose, Bld: 81 mg/dL (ref 70–99)
Phosphorus: 2.4 mg/dL — ABNORMAL LOW (ref 2.5–4.6)
Potassium: 4.4 mmol/L (ref 3.5–5.1)
Sodium: 139 mmol/L (ref 135–145)

## 2023-06-20 LAB — CBC WITH DIFFERENTIAL/PLATELET
Abs Immature Granulocytes: 0.15 10*3/uL — ABNORMAL HIGH (ref 0.00–0.07)
Basophils Absolute: 0.1 10*3/uL (ref 0.0–0.1)
Basophils Relative: 1 %
Eosinophils Absolute: 0.1 10*3/uL (ref 0.0–0.5)
Eosinophils Relative: 1 %
HCT: 21 % — ABNORMAL LOW (ref 36.0–46.0)
Hemoglobin: 6.7 g/dL — CL (ref 12.0–15.0)
Immature Granulocytes: 2 %
Lymphocytes Relative: 23 %
Lymphs Abs: 2.1 10*3/uL (ref 0.7–4.0)
MCH: 30.3 pg (ref 26.0–34.0)
MCHC: 31.9 g/dL (ref 30.0–36.0)
MCV: 95 fL (ref 80.0–100.0)
Monocytes Absolute: 0.9 10*3/uL (ref 0.1–1.0)
Monocytes Relative: 9 %
Neutro Abs: 5.9 10*3/uL (ref 1.7–7.7)
Neutrophils Relative %: 64 %
Platelets: 158 10*3/uL (ref 150–400)
RBC: 2.21 MIL/uL — ABNORMAL LOW (ref 3.87–5.11)
RDW: 19.2 % — ABNORMAL HIGH (ref 11.5–15.5)
WBC: 9.2 10*3/uL (ref 4.0–10.5)
nRBC: 0.4 % — ABNORMAL HIGH (ref 0.0–0.2)

## 2023-06-20 LAB — MAGNESIUM: Magnesium: 1.1 mg/dL — ABNORMAL LOW (ref 1.7–2.4)

## 2023-06-20 LAB — VITAMIN B12: Vitamin B-12: 697 pg/mL (ref 180–914)

## 2023-06-20 LAB — HEMOGLOBIN AND HEMATOCRIT, BLOOD
HCT: 24.6 % — ABNORMAL LOW (ref 36.0–46.0)
Hemoglobin: 8.4 g/dL — ABNORMAL LOW (ref 12.0–15.0)

## 2023-06-20 LAB — LACTIC ACID, PLASMA: Lactic Acid, Venous: 2.2 mmol/L (ref 0.5–1.9)

## 2023-06-20 LAB — FOLATE: Folate: 20 ng/mL (ref >5.9)

## 2023-06-20 LAB — PREPARE RBC (CROSSMATCH)

## 2023-06-20 LAB — BETA-HYDROXYBUTYRIC ACID: Beta-Hydroxybutyric Acid: 0.1 mmol/L (ref 0.05–0.27)

## 2023-06-20 MED ORDER — NA SULFATE-K SULFATE-MG SULF 17.5-3.13-1.6 GM/177ML PO SOLN
0.5000 | Freq: Once | ORAL | Status: AC
  Administered 2023-06-21: 177 mL via ORAL

## 2023-06-20 MED ORDER — SODIUM CHLORIDE 0.9% IV SOLUTION: Freq: Once | INTRAVENOUS | Status: AC

## 2023-06-20 MED ORDER — SIMETHICONE 80 MG PO CHEW: 240.0000 mg | CHEWABLE_TABLET | Freq: Once | ORAL | Status: DC

## 2023-06-20 MED ORDER — MAGNESIUM SULFATE 2 GM/50ML IV SOLN
2.0000 g | Freq: Once | INTRAVENOUS | Status: AC
  Administered 2023-06-20: 2 g via INTRAVENOUS
  Filled 2023-06-20: qty 50

## 2023-06-20 MED ORDER — RIVAROXABAN 10 MG PO TABS
10.0000 mg | ORAL_TABLET | Freq: Every day | ORAL | Status: DC
  Administered 2023-06-20 – 2023-06-21 (×2): 10 mg via ORAL
  Filled 2023-06-20 (×2): qty 1

## 2023-06-20 MED ORDER — POTASSIUM & SODIUM PHOSPHATES 280-160-250 MG PO PACK
1.0000 | PACK | Freq: Three times a day (TID) | ORAL | Status: AC
Start: 2023-06-20 — End: 2023-06-20
  Administered 2023-06-20 (×3): 1 via ORAL
  Filled 2023-06-20 (×3): qty 1

## 2023-06-20 MED ORDER — NA SULFATE-K SULFATE-MG SULF 17.5-3.13-1.6 GM/177ML PO SOLN
0.5000 | Freq: Once | ORAL | Status: AC
  Administered 2023-06-20: 177 mL via ORAL
  Filled 2023-06-20: qty 1

## 2023-06-20 MED ORDER — SIMETHICONE 80 MG PO CHEW
240.0000 mg | CHEWABLE_TABLET | Freq: Once | ORAL | Status: AC
  Administered 2023-06-21: 240 mg via ORAL
  Filled 2023-06-20: qty 3

## 2023-06-20 NOTE — H&P (View-Only) (Signed)
 [facility] Progress Note  CC:  Anemia  Subjective:  This is a 58 year old female who was seen by our service earlier this hospital stay for evaluation of dysphagia.  She has had a couple EGDs in the past, last 12/2022 as below.  Esophagram this hospitalization showing some dysmotility issues.  No EGD was performed.  GI signed off.  We are being called back to see her now for worsening anemia.  Hemoglobin down to 6.7 g this morning.  Receiving a second unit of packed red blood cells this admission.  Anemia appears to be chronic.  Iron studies were actually high during hospitalization about 5 months ago, but folate and vitamin B12 levels were low.  Had been recommended she take supplementation for those.  She denies any sign of bleeding.  She showed me a picture of her stools that are liquid light brown.  She denies seeing any red blood or black stools.  She tells me that she has had diarrhea during this hospital stay.  She had diarrhea back in November when we saw her as well and workup for infectious sources was negative.  She declined colonoscopy at that time.  Symptoms improved with Imodium .  She denies abdominal pain.  Says that he swallowing seems to be better.  Patient had EGD in April 2024 with no esophageal abnormality, was noted to have a cratered duodenal ulcer. She also had EGD in November 2024 for complaints of dysphagia and anemia and this was unremarkable.  Objective:  Vital signs in last 24 hours: Temp:  [98.2 F (36.8 C)-99.1 F (37.3 C)] 99.1 F (37.3 C) (04/28 1324) Pulse Rate:  [103-113] 107 (04/28 1324) Resp:  [18-20] 18 (04/28 1324) BP: (96-107)/(76-85) 96/78 (04/28 1324) SpO2:  [94 %-99 %] 99 % (04/28 1324) Last BM Date : 06/20/23 General:  Alert, in NAD. Heart:  Slightly tachy; no murmurs Pulm:  CTAB.  No W/R/R. Abdomen:  Soft, non-distended.  BS present.  Non-tender. Extremities:  Without edema.  Intake/Output from previous day: 04/27 0701 -  04/28 0700 In: -  Out: 1 [Stool:1]  Lab Results: Recent Labs    06/18/23 0236 06/19/23 0237 06/20/23 0524  WBC 15.2* 11.6* 9.2  HGB 8.6* 7.7* 6.7*  HCT 26.7* 23.6* 21.0*  PLT 145* 145* 158   BMET Recent Labs    06/18/23 0236 06/19/23 0237 06/20/23 0524  NA 141 140 139  K 4.5 4.2 4.4  CL 112* 113* 112*  CO2 17* 15* 18*  GLUCOSE 140* 137* 81  BUN 9 11 12   CREATININE 0.89 1.00 0.90  CALCIUM  7.7* 7.5* 7.2*   LFT Recent Labs    06/20/23 0524  ALBUMIN <1.5*   Assessment / Plan: *58 year old African-American female with primary complaint of dysphagia, complaining of difficulty with solid food and pills to the point that she was not taking her medicines for couple days prior to admission. EGD November 2024 with no evidence of any esophageal abnormality   Barium swallow today-no esophageal stricture or lesion, small hiatal hernia and evidence of esophageal dysmotility with poor primary peristaltic waves and incomplete esophageal emptying, spontaneous GERD with regurgitation of barium.   *Chronic anemia: Hemoglobin 6.7 g today, going to receive 1 unit of packed red blood cells and received another unit earlier this admission on 4/23 for hemoglobin in the 6 g range as well.  MCV is normal.  Iron studies were actually elevated when checked 5 months ago.  Her Vitamin B12 and folate  levels have been low and it has been recommended that she take supplements.  *Chronic diarrhea: Had complaints of diarrhea back in November as well.  Looks like she declined colonoscopy at that time.  Stool studies were negative for infectious workup.  She has never had colonoscopy.  Symptoms improved with Imodium  previously.  *EtOH abuse disorder *Malnutrition multifactoral-patient does not have any teeth likely contributing *Hypocalcemia *Hypomagnesemia *EtOH related neuropathy  -Check Vitamin B12 and folate levels again to see where they are at. -Will plan for repeat EGD and colonoscopy (consider  random biopsies to rule out microscopic colitis), tentatively on 4/29, if she is able to prep well. -Trend CBC.   LOS: 7 days   Martina Sledge. Harjas Biggins  06/20/2023, 3:54 PM

## 2023-06-20 NOTE — Progress Notes (Signed)
 HD#7 SUBJECTIVE:  Patient Summary: Christina Byrd is a 58 y.o. 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   Overnight Events: Night team called to bedside around 11 PM for patient pulling out her IV line.  Christina Byrd is also endorsing some inspiratory wheezing.  Added on DuoNebs as needed.  Patient was able to be counseled on importance of her IV antibiotics and received additional IV line.  Interim History: Patient was evaluated at bedside.  Continues to endorse diarrhea refractory to Imodium  but no blood noted in her stool.  Endorses some weakness in her feet, but this is not new.  Christina Byrd has been able to tolerate her diet well. OBJECTIVE:  Vital Signs: Vitals:   06/19/23 1947 06/19/23 2351 06/20/23 0409 06/20/23 0743  BP: 106/77 107/85 107/80 103/78  Pulse: (!) 113 (!) 112 (!) 110 (!) 112  Resp: 20 20 18 18   Temp: 98.2 F (36.8 C) 98.4 F (36.9 C) 98.5 F (36.9 C) 99.1 F (37.3 C)  TempSrc: Oral Oral Oral Oral  SpO2: 98% 94% 94% 98%  Weight:      Height:       Supplemental O2: Room Air SpO2: 98 % O2 Flow Rate (L/min): 0 L/min  Filed Weights   06/15/23 0334  Weight: 47.8 kg     Intake/Output Summary (Last 24 hours) at 06/20/2023 0941 Last data filed at 06/19/2023 1500 Gross per 24 hour  Intake --  Output 1 ml  Net -1 ml   Net IO Since Admission: 1,272.18 mL [06/20/23 0941]  CBC    Component Value Date/Time   WBC 9.2 06/20/2023 0524   RBC 2.21 (L) 06/20/2023 0524   HGB 6.7 (LL) 06/20/2023 0524   HGB 12.4 04/27/2022 1125   HCT 21.0 (L) 06/20/2023 0524   HCT 37.6 04/27/2022 1125   PLT 158 06/20/2023 0524   PLT 210 04/27/2022 1125   MCV 95.0 06/20/2023 0524   MCV 97 04/27/2022 1125   MCH 30.3 06/20/2023 0524   MCHC 31.9 06/20/2023 0524   RDW 19.2 (H) 06/20/2023 0524   RDW 17.0 (H) 04/27/2022 1125   LYMPHSABS  2.1 06/20/2023 0524   LYMPHSABS 1.5 04/27/2022 1125   MONOABS 0.9 06/20/2023 0524   EOSABS 0.1 06/20/2023 0524   EOSABS 0.0 04/27/2022 1125   BASOSABS 0.1 06/20/2023 0524   BASOSABS 0.0 04/27/2022 1125   CMP     Component Value Date/Time   NA 139 06/20/2023 0524   NA 144 04/27/2022 1125   K 4.4 06/20/2023 0524   CL 112 (H) 06/20/2023 0524   CO2 18 (L) 06/20/2023 0524   GLUCOSE 81 06/20/2023 0524   BUN 12 06/20/2023 0524   BUN 7 04/27/2022 1125   CREATININE 0.90 06/20/2023 0524   CREATININE 1.00 03/21/2014 1556   CALCIUM  7.2 (L) 06/20/2023 0524   PROT 6.2 (L) 06/13/2023 1518   PROT 7.1 04/27/2022 1125   ALBUMIN <1.5 (L) 06/20/2023 0524   ALBUMIN 3.5 (L) 04/27/2022 1125   AST 76 (H) 06/13/2023 1518   ALT 38 06/13/2023 1518   ALKPHOS 164 (H) 06/13/2023 1518   BILITOT 0.6 06/13/2023 1518   BILITOT 0.6 04/27/2022 1125   GFR 76.32 12/18/2012 1506   EGFR 82 04/27/2022 1125   GFRNONAA >60 06/20/2023 0524   GFRNONAA >89 12/11/2013 1214   Physical Exam Constitutional:      Appearance: Normal  appearance.  HENT:     Head: Normocephalic and atraumatic.  Cardiovascular:     Rate and Rhythm: Regular rhythm. Tachycardia present.     Pulses: Normal pulses.  Pulmonary:     Effort: Pulmonary effort is normal.     Breath sounds: Normal breath sounds.  Abdominal:     General: Abdomen is flat. Bowel sounds are normal.     Palpations: Abdomen is soft.  Neurological:     General: No focal deficit present.     Mental Status: Christina Byrd is alert. Mental status is at baseline.  Psychiatric:        Mood and Affect: Mood normal.        Behavior: Behavior normal.    ASSESSMENT/PLAN:  Assessment: Principal Problem:   Hypocalcemia Active Problems:   Alcohol withdrawal syndrome without complication (HCC)   Anemia   Protein-calorie malnutrition, severe (HCC)   AKI (acute kidney injury) (HCC)   Dehydration   Aspiration pneumonia of left lower lobe (HCC)  Plan: #Hypocalcemia,  improving #Chronic Dysphagia, improving #Diarhea #NAGMA #Lactic acidosis #Hypophosphatemia Calcium  stable this morning with corrected value of 9.2.  Bicarb improved to 18.  Creatinine stable at 0.9.  Her phosphorus is down to 2.4 this morning, so we will replenish.  Lactic acid is also elevated at 2.2 likely in the context of her diarrhea and reduced p.o. intake, so we will continue to encourage increased hydration.  Suspect that her hypovolemic state is also contributing to her tachycardia. - Progress diet as tolerated - Daily RFPs - Encourage hydration  - Encourage ambulation - Continue imodium    #Tachycardia, improving   #Aspiration PNA, improving Etiology unclear but improving. Initially was thought to be due to to hypovolemia from poor p.o. intake, and chronic diarrhea however Christina Byrd has been given multiple boluses and tachycardia still is not resolving.  Also considered alcohol withdrawal, however now Christina Byrd is outside of the window.  Historically, Christina Byrd runs in the 90-100 range.  There was some concern for shortness of breath, as well as expiratory wheezes, so chest x-ray was obtained which showed bilateral atelectasis; however could not rule out left lower lobe pneumonia.  Christina Byrd is very high risk for aspiration given her chronic dysphagia, and the timeline matches up with aspiration pneumonia as well. WBC normalized at 9.2 this morning, which is down from 11.6 yesterday. Will continue on Zosyn will plan to transition to PO prior to discharge.   Plan: - Continue Zosyn - Trend CBC, fever curve  #Alcohol Use Disorder #Hx of DT Tachycardia improving.  Tolerated p.o. Keppra  yesterday. CIWA stable at 1. - Resume p.o. meds as tolerated - Continue Keppra  - CIWA   #Anemia of Chronic Disease Down from 7.7 yesterday to 6.7 this morning, so we will give 1 unit.  Will also reengage GI for further workup given her downtrending hemoglobin over the past few days.  There are some concern that Christina Byrd may have a  GI bleed despite no overt bleeding. - Follow-up posttransfusion hemoglobin - Follow-up GI recs  Best Practice: Diet: Soft VTE: enoxaparin  (LOVENOX ) injection 40 mg Start: 06/13/23 1800 Code: Full AB: Zosyn DISPO: Anticipated discharge  TBD  to SNF pending  dysphagia and anemia workup .  Signature: Maxie Spaniel, MD Internal Medicine Resident, PGY-1 [facility]   Please contact the on call pager after 5 pm and on weekends at [phone].

## 2023-06-20 NOTE — Progress Notes (Signed)
 Physical Therapy Treatment Patient Details Name: Christina Byrd MRN: [id number] DOB: 1965-08-10 Today's Date: 06/20/2023   History of Present Illness 58 y.o. female presents to [facility] with worsening dysphagia over 3 days. Admitted with failure to thrive in setting of chronic EtOH abuse, AKI, and hypocalcemia. 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 Pt stating "I can't feel my legs at all. I can't move them you will have to help me. I think after I get my blood I'll be better." Pt ultimately agreed to work with PT at EOB. Despite stating she is unable to move LEs pt was able to complete AA LE seated exercises and initiated movement of bilat LEs on/off bed. Pt tolerated sitting EOB well. Acute PT to cont to follow. Suspect pt will progress well and be able to return home with support of family once pt improves medically.    If plan is discharge home, recommend the following: A lot of help with walking and/or transfers;A lot of help with bathing/dressing/bathroom;Assistance with cooking/housework;Assist for transportation;Help with stairs or ramp for entrance   Can travel by private vehicle        Equipment Recommendations  Rolling walker (2 wheels);BSC/3in1;Wheelchair (measurements PT);Wheelchair cushion (measurements PT)    Recommendations for Other Services       Precautions / Restrictions Precautions Precautions: Fall Restrictions Weight Bearing Restrictions Per Provider Order: No     Mobility  Bed Mobility Overal bed mobility: Needs Assistance Bed Mobility: Supine to Sit, Sit to Supine     Supine to sit: Mod assist, HOB elevated Sit to supine: Mod assist, HOB elevated   General bed mobility comments: pt brings herself up to long sit and then states "you;re going to have to move my legs for me, I can't move them" however pt with noted initiation and assist  with movement of LEs on/off bed    Transfers                   General transfer comment: refused to attempt standing stating "I need to get my blood first    Ambulation/Gait                   Stairs             Wheelchair Mobility     Tilt Bed    Modified Rankin (Stroke Patients Only)       Balance Overall balance assessment: Needs assistance, Mild deficits observed, not formally tested Sitting-balance support: No upper extremity supported, Feet supported Sitting balance-Leahy Scale: Fair Sitting balance - Comments: slight posterior lean, CGA for safety                                    Communication Communication Communication: No apparent difficulties Factors Affecting Communication: Reduced clarity of speech (mild)  Cognition Arousal: Alert Behavior During Therapy: WFL for tasks assessed/performed   PT - Cognitive impairments: No family/caregiver present to determine baseline                       PT - Cognition Comments: pt inconsistent with PLOF report and physical symptoms, pt with self-limiting tendencies stating "I can't move my legs" but then moves them Following commands: Impaired Following commands impaired: Follows one step commands with increased time, Only follows one  step commands consistently, Follows multi-step commands inconsistently    Cueing Cueing Techniques: Verbal cues, Tactile cues, Visual cues  Exercises General Exercises - Lower Extremity Ankle Circles/Pumps: AAROM, Both, 10 reps, Seated Long Arc Quad: AAROM, Both, 10 reps, Seated Hip Flexion/Marching: AROM, Both, 10 reps, Seated    General Comments General comments (skin integrity, edema, etc.): VSS on RA      Pertinent Vitals/Pain Pain Assessment Pain Assessment: Faces Faces Pain Scale: No hurt    Home Living                          Prior Function            PT Goals (current goals can now be found in the care plan  section) Acute Rehab PT Goals Patient Stated Goal: to be able to go in the kitchen and cook a meal PT Goal Formulation: With patient Time For Goal Achievement: 06/29/23 Potential to Achieve Goals: Fair Progress towards PT goals: Progressing toward goals    Frequency    Min 2X/week      PT Plan      Co-evaluation              AM-PAC PT "6 Clicks" Mobility   Outcome Measure  Help needed turning from your back to your side while in a flat bed without using bedrails?: A Lot Help needed moving from lying on your back to sitting on the side of a flat bed without using bedrails?: A Lot Help needed moving to and from a bed to a chair (including a wheelchair)?: A Lot Help needed standing up from a chair using your arms (e.g., wheelchair or bedside chair)?: A Lot Help needed to walk in hospital room?: A Lot Help needed climbing 3-5 steps with a railing? : Total 6 Click Score: 11    End of Session   Activity Tolerance: Patient tolerated treatment well Patient left: in bed;with call bell/phone within reach;with bed alarm set Nurse Communication: Mobility status PT Visit Diagnosis: Other abnormalities of gait and mobility (R26.89);Muscle weakness (generalized) (M62.81)     Time: 1610-9604 PT Time Calculation (min) (ACUTE ONLY): 16 min  Charges:    $Therapeutic Exercise: 8-22 mins PT General Charges $$ ACUTE PT VISIT: 1 Visit                     Renaee Caro, PT, DPT Acute Rehabilitation Services Secure chat preferred Office #: [phone]    Jenna Moan 06/20/2023, 2:24 PM

## 2023-06-20 NOTE — TOC Progression Note (Signed)
 Transition of Care [facility]) - Progression Note    Patient Details  Name: Christina Byrd MRN: [id number] Date of Birth: 1965-12-30  Transition of [facility] CM/SW Contact  Dori Devino A [place], LCSW Phone Number: 06/20/2023, 4:41 PM  Clinical Narrative:     Pt currently has no bed offers, possible option for home with home health if pt continues to improve per PT.   Home health with insurance could be barrier.    TOC will continue to follow.   Expected Discharge Plan: OP Rehab Barriers to Discharge: Continued Medical Work up  Expected Discharge Plan and Services     Post Acute Care Choice: Durable Medical Equipment Living arrangements for the past 2 months: Single Family Home                                       Social Determinants of Health (SDOH) Interventions SDOH Screenings   Food Insecurity: No Food Insecurity (06/14/2023)  Housing: Low Risk  (06/14/2023)  Transportation Needs: No Transportation Needs (06/14/2023)  Utilities: Not At Risk (06/14/2023)  Depression (PHQ2-9): Low Risk  (06/06/2023)  Tobacco Use: Medium Risk (06/13/2023)    Readmission Risk Interventions    11/21/2022    1:01 PM  Readmission Risk Prevention Plan  Transportation Screening Complete  Medication Review (RN Care Manager) Complete  PCP or Specialist appointment within 3-5 days of discharge Complete  HRI or Home Care Consult Complete  Palliative Care Screening Not Applicable  Skilled Nursing Facility Not Applicable

## 2023-06-20 NOTE — Progress Notes (Signed)
 [facility] Progress Note  CC:  Anemia  Subjective:  This is a 58 year old female who was seen by our service earlier this hospital stay for evaluation of dysphagia.  She has had a couple EGDs in the past, last 12/2022 as below.  Esophagram this hospitalization showing some dysmotility issues.  No EGD was performed.  GI signed off.  We are being called back to see her now for worsening anemia.  Hemoglobin down to 6.7 g this morning.  Receiving a second unit of packed red blood cells this admission.  Anemia appears to be chronic.  Iron studies were actually high during hospitalization about 5 months ago, but folate and vitamin B12 levels were low.  Had been recommended she take supplementation for those.  She denies any sign of bleeding.  She showed me a picture of her stools that are liquid light brown.  She denies seeing any red blood or black stools.  She tells me that she has had diarrhea during this hospital stay.  She had diarrhea back in November when we saw her as well and workup for infectious sources was negative.  She declined colonoscopy at that time.  Symptoms improved with Imodium .  She denies abdominal pain.  Says that he swallowing seems to be better.  Patient had EGD in April 2024 with no esophageal abnormality, was noted to have a cratered duodenal ulcer. She also had EGD in November 2024 for complaints of dysphagia and anemia and this was unremarkable.  Objective:  Vital signs in last 24 hours: Temp:  [98.2 F (36.8 C)-99.1 F (37.3 C)] 99.1 F (37.3 C) (04/28 1324) Pulse Rate:  [103-113] 107 (04/28 1324) Resp:  [18-20] 18 (04/28 1324) BP: (96-107)/(76-85) 96/78 (04/28 1324) SpO2:  [94 %-99 %] 99 % (04/28 1324) Last BM Date : 06/20/23 General:  Alert, in NAD. Heart:  Slightly tachy; no murmurs Pulm:  CTAB.  No W/R/R. Abdomen:  Soft, non-distended.  BS present.  Non-tender. Extremities:  Without edema.  Intake/Output from previous day: 04/27 0701 -  04/28 0700 In: -  Out: 1 [Stool:1]  Lab Results: Recent Labs    06/18/23 0236 06/19/23 0237 06/20/23 0524  WBC 15.2* 11.6* 9.2  HGB 8.6* 7.7* 6.7*  HCT 26.7* 23.6* 21.0*  PLT 145* 145* 158   BMET Recent Labs    06/18/23 0236 06/19/23 0237 06/20/23 0524  NA 141 140 139  K 4.5 4.2 4.4  CL 112* 113* 112*  CO2 17* 15* 18*  GLUCOSE 140* 137* 81  BUN 9 11 12   CREATININE 0.89 1.00 0.90  CALCIUM  7.7* 7.5* 7.2*   LFT Recent Labs    06/20/23 0524  ALBUMIN <1.5*   Assessment / Plan: *58 year old African-American female with primary complaint of dysphagia, complaining of difficulty with solid food and pills to the point that she was not taking her medicines for couple days prior to admission. EGD November 2024 with no evidence of any esophageal abnormality   Barium swallow today-no esophageal stricture or lesion, small hiatal hernia and evidence of esophageal dysmotility with poor primary peristaltic waves and incomplete esophageal emptying, spontaneous GERD with regurgitation of barium.   *Chronic anemia: Hemoglobin 6.7 g today, going to receive 1 unit of packed red blood cells and received another unit earlier this admission on 4/23 for hemoglobin in the 6 g range as well.  MCV is normal.  Iron studies were actually elevated when checked 5 months ago.  Her Vitamin B12 and folate  levels have been low and it has been recommended that she take supplements.  *Chronic diarrhea: Had complaints of diarrhea back in November as well.  Looks like she declined colonoscopy at that time.  Stool studies were negative for infectious workup.  She has never had colonoscopy.  Symptoms improved with Imodium  previously.  *EtOH abuse disorder *Malnutrition multifactoral-patient does not have any teeth likely contributing *Hypocalcemia *Hypomagnesemia *EtOH related neuropathy  -Check Vitamin B12 and folate levels again to see where they are at. -Will plan for repeat EGD and colonoscopy (consider  random biopsies to rule out microscopic colitis), tentatively on 4/29, if she is able to prep well. -Trend CBC.   LOS: 7 days   Martina Sledge. Harjas Biggins  06/20/2023, 3:54 PM

## 2023-06-20 NOTE — Progress Notes (Signed)
 Attempted to start patients bowel prep, Patient is refusing. Stating the doctor told her she could wait to have the procedure until Wednesday. Also informed patient she is a clean liquid diet and patient stated she was not going to not eat real food. Refusing to listen to nursing staff and would not start bowel prep for colonoscopy.

## 2023-06-21 ENCOUNTER — Inpatient Hospital Stay (HOSPITAL_COMMUNITY)

## 2023-06-21 ENCOUNTER — Encounter (HOSPITAL_COMMUNITY): Payer: Self-pay | Admitting: Infectious Diseases

## 2023-06-21 ENCOUNTER — Encounter (HOSPITAL_COMMUNITY)
Admission: EM | Disposition: A | Discharge status: Home / Self Care | Payer: Self-pay | Source: Home / Self Care | Attending: Infectious Diseases

## 2023-06-21 DIAGNOSIS — N183 Chronic kidney disease, stage 3 unspecified: Secondary | ICD-10-CM | POA: Diagnosis not present

## 2023-06-21 DIAGNOSIS — K6389 Other specified diseases of intestine: Secondary | ICD-10-CM

## 2023-06-21 DIAGNOSIS — K529 Noninfective gastroenteritis and colitis, unspecified: Secondary | ICD-10-CM

## 2023-06-21 DIAGNOSIS — D649 Anemia, unspecified: Secondary | ICD-10-CM | POA: Diagnosis not present

## 2023-06-21 DIAGNOSIS — R1310 Dysphagia, unspecified: Secondary | ICD-10-CM | POA: Diagnosis not present

## 2023-06-21 DIAGNOSIS — D62 Acute posthemorrhagic anemia: Secondary | ICD-10-CM | POA: Diagnosis not present

## 2023-06-21 DIAGNOSIS — I129 Hypertensive chronic kidney disease with stage 1 through stage 4 chronic kidney disease, or unspecified chronic kidney disease: Secondary | ICD-10-CM | POA: Diagnosis not present

## 2023-06-21 HISTORY — PX: COLONOSCOPY: SHX5424

## 2023-06-21 HISTORY — PX: ESOPHAGOGASTRODUODENOSCOPY: SHX5428

## 2023-06-21 LAB — CBC WITH DIFFERENTIAL/PLATELET
Abs Immature Granulocytes: 0.19 10*3/uL — ABNORMAL HIGH (ref 0.00–0.07)
Basophils Absolute: 0.1 10*3/uL (ref 0.0–0.1)
Basophils Relative: 1 %
Eosinophils Absolute: 0 10*3/uL (ref 0.0–0.5)
Eosinophils Relative: 1 %
HCT: 28.3 % — ABNORMAL LOW (ref 36.0–46.0)
Hemoglobin: 9.5 g/dL — ABNORMAL LOW (ref 12.0–15.0)
Immature Granulocytes: 2 %
Lymphocytes Relative: 27 %
Lymphs Abs: 2.1 10*3/uL (ref 0.7–4.0)
MCH: 31.1 pg (ref 26.0–34.0)
MCHC: 33.6 g/dL (ref 30.0–36.0)
MCV: 92.8 fL (ref 80.0–100.0)
Monocytes Absolute: 1 10*3/uL (ref 0.1–1.0)
Monocytes Relative: 13 %
Neutro Abs: 4.3 10*3/uL (ref 1.7–7.7)
Neutrophils Relative %: 56 %
Platelets: 187 10*3/uL (ref 150–400)
RBC: 3.05 MIL/uL — ABNORMAL LOW (ref 3.87–5.11)
RDW: 18 % — ABNORMAL HIGH (ref 11.5–15.5)
WBC: 7.8 10*3/uL (ref 4.0–10.5)
nRBC: 0.6 % — ABNORMAL HIGH (ref 0.0–0.2)

## 2023-06-21 LAB — RENAL FUNCTION PANEL
Albumin: 1.7 g/dL — ABNORMAL LOW (ref 3.5–5.0)
Anion gap: 10 (ref 5–15)
BUN: 11 mg/dL (ref 6–20)
CO2: 19 mmol/L — ABNORMAL LOW (ref 22–32)
Calcium: 7.5 mg/dL — ABNORMAL LOW (ref 8.9–10.3)
Chloride: 114 mmol/L — ABNORMAL HIGH (ref 98–111)
Creatinine, Ser: 0.97 mg/dL (ref 0.44–1.00)
GFR, Estimated: >60 mL/min (ref >60)
Glucose, Bld: 73 mg/dL (ref 70–99)
Phosphorus: 3.4 mg/dL (ref 2.5–4.6)
Potassium: 3.9 mmol/L (ref 3.5–5.1)
Sodium: 143 mmol/L (ref 135–145)

## 2023-06-21 LAB — TYPE AND SCREEN
ABO/RH(D): B NEG
Antibody Screen: NEGATIVE
Unit division: 0

## 2023-06-21 LAB — BPAM RBC
Blood Product Expiration Date: 202505052359
ISSUE DATE / TIME: 202504281307
Unit Type and Rh: 1700

## 2023-06-21 SURGERY — EGD (ESOPHAGOGASTRODUODENOSCOPY)
Anesthesia: Monitor Anesthesia Care

## 2023-06-21 MED ORDER — PHENYLEPHRINE HCL (PRESSORS) 10 MG/ML IV SOLN
INTRAVENOUS | Status: DC | PRN
  Administered 2023-06-21 (×4): 80 ug via INTRAVENOUS

## 2023-06-21 MED ORDER — PROPOFOL 500 MG/50ML IV EMUL
INTRAVENOUS | Status: DC | PRN
  Administered 2023-06-21: 100 ug/kg/min via INTRAVENOUS

## 2023-06-21 MED ORDER — PROPOFOL 10 MG/ML IV BOLUS
INTRAVENOUS | Status: DC | PRN
  Administered 2023-06-21: 10 mg via INTRAVENOUS
  Administered 2023-06-21: 20 mg via INTRAVENOUS
  Administered 2023-06-21: 30 mg via INTRAVENOUS
  Administered 2023-06-21: 10 mg via INTRAVENOUS

## 2023-06-21 MED ORDER — SODIUM CHLORIDE 0.9 % IV SOLN: INTRAVENOUS | Status: DC | PRN

## 2023-06-21 MED ORDER — LACTATED RINGERS IV SOLN: INTRAVENOUS | Status: DC | PRN

## 2023-06-21 MED ORDER — LIDOCAINE 2% (20 MG/ML) 5 ML SYRINGE
INTRAMUSCULAR | Status: DC | PRN
  Administered 2023-06-21: 40 mg via INTRAVENOUS

## 2023-06-21 NOTE — Progress Notes (Signed)
 OT Cancellation Note  Patient Details Name: TEMPERANCE KUNDRAT MRN: [id number] DOB: [birth year]   Cancelled Treatment:    Reason Eval/Treat Not Completed: Patient at procedure or test/ unavailable. OT will follow up next available time as appropriate  Alfred Ann 06/21/2023, 9:43 AM

## 2023-06-21 NOTE — Progress Notes (Signed)
 HD#8 SUBJECTIVE:  Patient Summary: Christina Byrd is a 58 y.o. 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   Overnight Events: Night team called to bedside around 11 PM for patient pulling out her IV line.  She is also endorsing some inspiratory wheezing.  Added on DuoNebs as needed.  Patient was able to be counseled on importance of her IV antibiotics and received additional IV line.  Interim History: Patient was evaluated at bedside.  Notes that her diarrhea is improving.  Denies any chest pain, troubles breathing, abdominal pain, or other signs or symptoms.  Discussed that she will be receiving her endoscopy and colonoscopy early this morning. OBJECTIVE:  Vital Signs: Vitals:   06/20/23 2012 06/20/23 2338 06/21/23 0524 06/21/23 0738  BP: 111/88 (!) 111/96 (!) 111/90 104/77  Pulse: 100 96 86 88  Resp: 18 18 18 18   Temp: 98.1 F (36.7 C) 98.4 F (36.9 C) 97.6 F (36.4 C)   TempSrc: Oral Oral Oral   SpO2: 99% 100% 97% 100%  Weight:   56.3 kg   Height:       Supplemental O2: Room Air SpO2: 100 % O2 Flow Rate (L/min): 0 L/min  Filed Weights   06/15/23 0334 06/21/23 0524  Weight: 47.8 kg 56.3 kg     Intake/Output Summary (Last 24 hours) at 06/21/2023 0837 Last data filed at 06/20/2023 1630 Gross per 24 hour  Intake 645.49 ml  Output --  Net 645.49 ml   Net IO Since Admission: 1,917.67 mL [06/21/23 0837]  CBC    Component Value Date/Time   WBC 7.8 06/21/2023 0456   RBC 3.05 (L) 06/21/2023 0456   HGB 9.5 (L) 06/21/2023 0456   HGB 12.4 04/27/2022 1125   HCT 28.3 (L) 06/21/2023 0456   HCT 37.6 04/27/2022 1125   PLT 187 06/21/2023 0456   PLT 210 04/27/2022 1125   MCV 92.8 06/21/2023 0456   MCV 97 04/27/2022 1125   MCH 31.1 06/21/2023 0456   MCHC 33.6 06/21/2023 0456   RDW 18.0 (H) 06/21/2023 0456   RDW 17.0  (H) 04/27/2022 1125   LYMPHSABS 2.1 06/21/2023 0456   LYMPHSABS 1.5 04/27/2022 1125   MONOABS 1.0 06/21/2023 0456   EOSABS 0.0 06/21/2023 0456   EOSABS 0.0 04/27/2022 1125   BASOSABS 0.1 06/21/2023 0456   BASOSABS 0.0 04/27/2022 1125   CMP     Component Value Date/Time   NA 143 06/21/2023 0456   NA 144 04/27/2022 1125   K 3.9 06/21/2023 0456   CL 114 (H) 06/21/2023 0456   CO2 19 (L) 06/21/2023 0456   GLUCOSE 73 06/21/2023 0456   BUN 11 06/21/2023 0456   BUN 7 04/27/2022 1125   CREATININE 0.97 06/21/2023 0456   CREATININE 1.00 03/21/2014 1556   CALCIUM  7.5 (L) 06/21/2023 0456   PROT 6.2 (L) 06/13/2023 1518   PROT 7.1 04/27/2022 1125   ALBUMIN 1.7 (L) 06/21/2023 0456   ALBUMIN 3.5 (L) 04/27/2022 1125   AST 76 (H) 06/13/2023 1518   ALT 38 06/13/2023 1518   ALKPHOS 164 (H) 06/13/2023 1518   BILITOT 0.6 06/13/2023 1518   BILITOT 0.6 04/27/2022 1125   GFR 76.32 12/18/2012 1506   EGFR 82 04/27/2022 1125   GFRNONAA >60 06/21/2023 0456   GFRNONAA >89 12/11/2013 1214   Physical Exam Constitutional:      Appearance: Normal appearance.  HENT:     Head: Normocephalic and atraumatic.  Cardiovascular:     Rate and Rhythm: Normal rate and regular rhythm.  Pulmonary:     Effort: Pulmonary effort is normal.     Breath sounds: Normal breath sounds.  Abdominal:     General: Abdomen is flat. Bowel sounds are normal.     Palpations: Abdomen is soft.  Neurological:     General: No focal deficit present.     Mental Status: She is alert. Mental status is at baseline.  Psychiatric:        Attention and Perception: Attention normal.        Mood and Affect: Mood normal.        Behavior: Behavior normal.    ASSESSMENT/PLAN:  Assessment: Principal Problem:   Hypocalcemia Active Problems:   Alcohol withdrawal syndrome without complication (HCC)   Anemia   Protein-calorie malnutrition, severe (HCC)   AKI (acute kidney injury) (HCC)   Dehydration   Aspiration pneumonia of left  lower lobe (HCC)   History of non anemic vitamin B12 deficiency  Plan: #Hypocalcemia, improving #Chronic Dysphagia, improving #Diarhea #NAGMA, improving #Hypophosphatemia Calcium  stable with correct value of 8.9 this morning. Bicarb stable at 19. Phosphorous improved to 3.4. Suspect this will continue to encourage as diet is advanced. Will continue to encourage increased hydration and PO intake.  - Progress diet as tolerated - Daily RFPs - Encourage hydration  - Encourage ambulation - Continue imodium    #Tachycardia, improving   #Aspiration PNA, improving Unclear etiology but improving. Appears to be roughly back at baseline in the 90s. Leukocytosis has resolved as it is 7.8 this morning. Currently day 4/7 of her antibiotics. Will continue for a total treatment duration of 7 days. For today, will continue her on Zosyn but will switch her to PO prior to discharge.  - Continue Zosyn (day 4/7) - Trend CBC, fever curve  #Alcohol Use Disorder #Hx of DT Tachycardia improving.  Tolerated p.o. Keppra  yesterday. - Resume p.o. meds as tolerated - Continue Keppra  - CIWA   #Anemia of Chronic Disease Hemoglobin improved to 9.5 after her transfusion yesterday. GI consulted yesterday who is planning for colonoscopy and EGD this morning for evaluation of possible bleed. B12 and folate checked yesterday which were both normal. If her colonoscopy and EGD show no issues, patient is likely stable for discharge given her improving electrolyte abnormalities and other improving labs.  - Follow-up GI recs  Best Practice: Diet: Soft VTE: rivaroxaban (XARELTO) tablet 10 mg Start: 06/20/23 1700 Code: Full AB: Zosyn DISPO: Anticipated discharge  TBD  to SNF pending  dysphagia and anemia workup .  Signature: Maxie Spaniel, MD Internal Medicine Resident, PGY-1 [facility]   Please contact the on call pager after 5 pm and on weekends at [phone].

## 2023-06-21 NOTE — Plan of Care (Signed)
 Assumed care at 1900. Pt is Aox4. Pt has agreed to bowel prep for todays procedure. Pt has been having multiple bowels overnight. None are clear to my knowledge. Family at the bedside. Pt has been having achey knees overnight, resolving with use of hot packs.

## 2023-06-21 NOTE — Transfer of Care (Signed)
 Immediate Anesthesia Transfer of Care Note  Patient: Christina Byrd  Procedure(s) Performed: EGD (ESOPHAGOGASTRODUODENOSCOPY) COLONOSCOPY  Patient Location: PACU  Anesthesia Type:MAC  Level of Consciousness: drowsy  Airway & Oxygen Therapy: Patient Spontanous Breathing  Post-op Assessment: Report given to RN and Post -op Vital signs reviewed and stable  Post vital signs: Reviewed and stable  Last Vitals:  Vitals Value Taken Time  BP 110/83 06/21/23 1021  Temp    Pulse 83 06/21/23 1023  Resp 12 06/21/23 1023  SpO2 97 % 06/21/23 1023  Vitals shown include unfiled device data.  Last Pain:  Vitals:   06/21/23 0845  TempSrc: Temporal  PainSc: 0-No pain      Patients Stated Pain Goal: 0 (06/20/23 0738)  Complications: No notable events documented.

## 2023-06-21 NOTE — Anesthesia Preprocedure Evaluation (Addendum)
 Anesthesia Evaluation  Patient identified by MRN, date of birth, ID band Patient awake    Reviewed: Allergy & Precautions, NPO status , Patient's Chart, lab work & pertinent test results  History of Anesthesia Complications Negative for: history of anesthetic complications  Airway Mallampati: II  TM Distance: >3 FB Neck ROM: Full    Dental  (+) Edentulous Upper, Edentulous Lower   Pulmonary former smoker   Pulmonary exam normal        Cardiovascular hypertension, Normal cardiovascular exam   '24 TTE - EF 60 to 65%. Trivial mitral valve regurgitation.     Neuro/Psych Seizures -, Well Controlled,  PSYCHIATRIC DISORDERS  Depression     Neuromuscular disease    GI/Hepatic PUD,GERD  Medicated and Controlled,,(+)     substance abuse  alcohol use and cocaine use  Endo/Other  Hypothyroidism   Ca 7.5 Cl 114   Renal/GU CRFRenal disease     Musculoskeletal  (+) Arthritis ,    Abdominal   Peds  Hematology  (+) Blood dyscrasia, anemia   Anesthesia Other Findings   Reproductive/Obstetrics                             Anesthesia Physical Anesthesia Plan  ASA: 3  Anesthesia Plan: MAC   Post-op Pain Management: Minimal or no pain anticipated   Induction:   PONV Risk Score and Plan: 2 and Propofol  infusion and Treatment may vary due to age or medical condition  Airway Management Planned: Nasal Cannula and Natural Airway  Additional Equipment: None  Intra-op Plan:   Post-operative Plan:   Informed Consent: I have reviewed the patients History and Physical, chart, labs and discussed the procedure including the risks, benefits and alternatives for the proposed anesthesia with the patient or authorized representative who has indicated his/her understanding and acceptance.       Plan Discussed with: CRNA and Anesthesiologist  Anesthesia Plan Comments:        Anesthesia Quick  Evaluation

## 2023-06-21 NOTE — Op Note (Signed)
 [facility] Patient Name: Christina Byrd Procedure Date : 06/21/2023 MRN: [id number] Attending MD: Nannette Babe , MD, [id number] Date of Birth: 03-16-1965 CSN: [id number] Age: 58 Admit Type: Inpatient Procedure:                Colonoscopy Indications:              Chronic diarrhea, Acute on chronic anemia requiring                            blood transfusion, no previous colonoscopy Providers:                Amber Bail. Bridgett Camps, MD, Franco Isaac, RN, Nicki Barnacle,                            Technician Referring MD:             [facility] Medicines:                Monitored Anesthesia Care Complications:            No immediate complications. Estimated Blood Loss:     Estimated blood loss was minimal. Procedure:                Pre-Anesthesia Assessment:                           - Prior to the procedure, a History and Physical                            was performed, and patient medications and                            allergies were reviewed. The patient's tolerance of                            previous anesthesia was also reviewed. The risks                            and benefits of the procedure and the sedation                            options and risks were discussed with the patient.                            All questions were answered, and informed consent                            was obtained. Prior Anticoagulants: The patient has                            taken no anticoagulant or antiplatelet agents. ASA                            Grade Assessment: III - A patient with severe  systemic disease. After reviewing the risks and                            benefits, the patient was deemed in satisfactory                            condition to undergo the procedure.                           After obtaining informed consent, the colonoscope                            was passed under direct vision. Throughout the                             procedure, the patient's blood pressure, pulse, and                            oxygen saturations were monitored continuously. The                            [id number]-HQ190L ([id number]) Olympus colonoscope was                            introduced through the anus and advanced to the                            terminal ileum. The colonoscopy was performed                            without difficulty. The patient tolerated the                            procedure well. The quality of the bowel                            preparation was good. The terminal ileum, ileocecal                            valve, appendiceal orifice, and rectum were                            photographed. Scope In: 9:57:52 AM Scope Out: 10:12:59 AM Scope Withdrawal Time: 0 hours 12 minutes 54 seconds  Total Procedure Duration: 0 hours 15 minutes 7 seconds  Findings:      The digital rectal exam was normal.      The terminal ileum appeared normal.      Diffuse granular and mildly edematous mucosa was found in the entire       colon. Biopsies for histology were taken with a cold forceps from the       right colon and left colon for evaluation of microscopic colitis.      The retroflexed view of the distal rectum and anal verge was normal and       showed no anal or rectal abnormalities. Impression:               -  The examined portion of the ileum was normal.                           - Granularity and mild edema in the entire examined                            colon. Biopsied.                           - No polyps or bleeding lesions seen.                           - The distal rectum and anal verge are normal on                            retroflexion view. Moderate Sedation:      N/A Recommendation:           - Return patient to hospital ward for ongoing care.                           - Advance diet as tolerated.                           - Continue present medications.                            - Await pathology results.                           - Repeat colonoscopy is recommended for screening.                            The colonoscopy date will be determined after                            pathology results from today's exam become                            available for review.                           - GI will follow-up pathology results, but sign                            off, call if questions. Procedure Code(s):        --- Professional ---                           [phone], Colonoscopy, flexible; with biopsy, single                            or multiple Diagnosis Code(s):        --- Professional ---                           K63.89, Other specified diseases  of intestine                           K52.9, Noninfective gastroenteritis and colitis,                            unspecified                           D62, Acute posthemorrhagic anemia CPT copyright 2022 American Medical Association. All rights reserved. The codes documented in this report are preliminary and upon coder review may  be revised to meet current compliance requirements. Nannette Babe, MD 06/21/2023 10:34:23 AM This report has been signed electronically. Number of Addenda: 0

## 2023-06-21 NOTE — Anesthesia Postprocedure Evaluation (Signed)
 Anesthesia Post Note  Patient: FAELYN SPEER  Procedure(s) Performed: EGD (ESOPHAGOGASTRODUODENOSCOPY) COLONOSCOPY     Patient location during evaluation: PACU Anesthesia Type: MAC Level of consciousness: awake and alert Pain management: pain level controlled Vital Signs Assessment: post-procedure vital signs reviewed and stable Respiratory status: spontaneous breathing, nonlabored ventilation and respiratory function stable Cardiovascular status: stable and blood pressure returned to baseline Anesthetic complications: no   No notable events documented.  Last Vitals:  Vitals:   06/21/23 1040 06/21/23 1145  BP: (!) 108/90 110/82  Pulse: 79 91  Resp: 20   Temp:  36.9 C  SpO2: 94% 94%    Last Pain:  Vitals:   06/21/23 1040  TempSrc:   PainSc: 0-No pain                 Juventino Oppenheim

## 2023-06-21 NOTE — Progress Notes (Signed)
 Patient was taken for the procedure ar 0830 am via bed.

## 2023-06-21 NOTE — Interval H&P Note (Signed)
 History and Physical Interval Note: For EGD and colonoscopy today to evaluate anemia requiring blood transfusion     Latest Ref Rng & Units 06/21/2023    4:56 AM 06/20/2023    6:08 PM 06/20/2023    5:24 AM  CBC  WBC 4.0 - 10.5 K/uL 7.8   9.2   Hemoglobin 12.0 - 15.0 g/dL 9.5  8.4  6.7   Hematocrit 36.0 - 46.0 % 28.3  24.6  21.0   Platelets 150 - 400 K/uL 187   158     06/21/2023 9:28 AM  [name]  has presented today for surgery, with the diagnosis of Anemia, chronic diarrhea.  The various methods of treatment have been discussed with the patient and family. After consideration of risks, benefits and other options for treatment, the patient has consented to  Procedure(s): EGD (ESOPHAGOGASTRODUODENOSCOPY) (N/A) COLONOSCOPY (N/A) as a surgical intervention.  The patient's history has been reviewed, patient examined, no change in status, stable for surgery.  I have reviewed the patient's chart and labs.  Questions were answered to the patient's satisfaction.     Amber Bail Jalexa Pifer

## 2023-06-21 NOTE — Progress Notes (Signed)
 PATIENT IS BACK IN THE ROOM AFTER PROCEDURE AT 1140 AM. WILL CONTINUE TO MONITOR

## 2023-06-21 NOTE — Op Note (Signed)
 [facility] Patient Name: Christina Byrd Procedure Date : 06/21/2023 MRN: [id number] Attending MD: Nannette Babe , MD, [id number] Date of Birth: [birth year] CSN: [id number] Age: 58 Admit Type: Inpatient Procedure:                Upper GI endoscopy Indications:              Acute on chronic anemia requiring blood                            transfusion, hx of B12 def, esophageal dysmotility Providers:                Amber Bail. Bridgett Camps, MD, Franco Isaac, RN, Nicki Barnacle,                            Technician Referring MD:             [facility] Medicines:                Monitored Anesthesia Care Complications:            No immediate complications. Estimated Blood Loss:     Estimated blood loss was minimal. Procedure:                Pre-Anesthesia Assessment:                           - Prior to the procedure, a History and Physical                            was performed, and patient medications and                            allergies were reviewed. The patient's tolerance of                            previous anesthesia was also reviewed. The risks                            and benefits of the procedure and the sedation                            options and risks were discussed with the patient.                            All questions were answered, and informed consent                            was obtained. Prior Anticoagulants: The patient has                            taken no anticoagulant or antiplatelet agents. ASA                            Grade Assessment: III - A patient with severe  systemic disease. After reviewing the risks and                            benefits, the patient was deemed in satisfactory                            condition to undergo the procedure.                           After obtaining informed consent, the endoscope was                            passed under direct vision. Throughout the                             procedure, the patient's blood pressure, pulse, and                            oxygen saturations were monitored continuously. The                            [id number]-[id number] ([id number]) Olympus endoscope was introduced                            through the mouth, and advanced to the second part                            of duodenum. The upper GI endoscopy was                            accomplished without difficulty. The patient                            tolerated the procedure well. Scope In: Scope Out: Findings:      The examined esophagus was normal.      The entire examined stomach was normal.      The examined duodenum was normal. Biopsies for histology were taken with       a cold forceps for evaluation of celiac disease. Impression:               - Normal esophagus.                           - Normal stomach.                           - Normal examined duodenum. Biopsied given chronic                            diarrhea. Moderate Sedation:      N/A Recommendation:           - Return patient to hospital ward for ongoing care.                           - Advance diet as tolerated.                           -  Continue present medications.                           - Await pathology results.                           - See the other procedure note for documentation of                            additional recommendations. Procedure Code(s):        --- Professional ---                           [phone], Esophagogastroduodenoscopy, flexible,                            transoral; with biopsy, single or multiple Diagnosis Code(s):        --- Professional ---                           D62, Acute posthemorrhagic anemia CPT copyright 2022 American Medical Association. All rights reserved. The codes documented in this report are preliminary and upon coder review may  be revised to meet current compliance requirements. Nannette Babe, MD 06/21/2023 9:54:40 AM This  report has been signed electronically. Number of Addenda: 0

## 2023-06-21 NOTE — Plan of Care (Signed)
  Problem: Education: Goal: Knowledge of General Education information will improve Description: Including pain rating scale, medication(s)/side effects and non-pharmacologic comfort measures Outcome: Progressing   Problem: Health Behavior/Discharge Planning: Goal: Ability to manage health-related needs will improve Outcome: Progressing   Problem: Clinical Measurements: Goal: Will remain free from infection Outcome: Progressing Goal: Cardiovascular complication will be avoided Outcome: Progressing   Problem: Nutrition: Goal: Adequate nutrition will be maintained Outcome: Progressing   Problem: Coping: Goal: Level of anxiety will decrease Outcome: Progressing   Problem: Elimination: Goal: Will not experience complications related to bowel motility Outcome: Progressing Goal: Will not experience complications related to urinary retention Outcome: Progressing   Problem: Pain Managment: Goal: General experience of comfort will improve and/or be controlled Outcome: Progressing   Problem: Safety: Goal: Ability to remain free from injury will improve Outcome: Progressing   Problem: Skin Integrity: Goal: Risk for impaired skin integrity will decrease Outcome: Progressing   Problem: Education: Goal: Knowledge of disease or condition will improve Outcome: Progressing   Problem: Safety: Goal: Ability to remain free from injury will improve Outcome: Progressing

## 2023-06-22 ENCOUNTER — Other Ambulatory Visit (HOSPITAL_COMMUNITY): Payer: Self-pay

## 2023-06-22 ENCOUNTER — Encounter (HOSPITAL_COMMUNITY): Payer: Self-pay | Admitting: Internal Medicine

## 2023-06-22 DIAGNOSIS — M25561 Pain in right knee: Secondary | ICD-10-CM | POA: Diagnosis not present

## 2023-06-22 DIAGNOSIS — N183 Chronic kidney disease, stage 3 unspecified: Secondary | ICD-10-CM | POA: Diagnosis not present

## 2023-06-22 DIAGNOSIS — R5381 Other malaise: Secondary | ICD-10-CM | POA: Diagnosis not present

## 2023-06-22 DIAGNOSIS — D649 Anemia, unspecified: Secondary | ICD-10-CM | POA: Diagnosis not present

## 2023-06-22 DIAGNOSIS — J690 Pneumonitis due to inhalation of food and vomit: Secondary | ICD-10-CM | POA: Diagnosis not present

## 2023-06-22 LAB — CBC WITH DIFFERENTIAL/PLATELET
Abs Immature Granulocytes: 0.08 10*3/uL — ABNORMAL HIGH (ref 0.00–0.07)
Basophils Absolute: 0 10*3/uL (ref 0.0–0.1)
Basophils Relative: 1 %
Eosinophils Absolute: 0.1 10*3/uL (ref 0.0–0.5)
Eosinophils Relative: 1 %
HCT: 27.4 % — ABNORMAL LOW (ref 36.0–46.0)
Hemoglobin: 9.4 g/dL — ABNORMAL LOW (ref 12.0–15.0)
Immature Granulocytes: 1 %
Lymphocytes Relative: 17 %
Lymphs Abs: 1.5 10*3/uL (ref 0.7–4.0)
MCH: 31.6 pg (ref 26.0–34.0)
MCHC: 34.3 g/dL (ref 30.0–36.0)
MCV: 92.3 fL (ref 80.0–100.0)
Monocytes Absolute: 1 10*3/uL (ref 0.1–1.0)
Monocytes Relative: 11 %
Neutro Abs: 6.2 10*3/uL (ref 1.7–7.7)
Neutrophils Relative %: 69 %
Platelets: 205 10*3/uL (ref 150–400)
RBC: 2.97 MIL/uL — ABNORMAL LOW (ref 3.87–5.11)
RDW: 18.1 % — ABNORMAL HIGH (ref 11.5–15.5)
WBC: 8.9 10*3/uL (ref 4.0–10.5)
nRBC: 0.2 % (ref 0.0–0.2)

## 2023-06-22 LAB — RENAL FUNCTION PANEL
Albumin: 1.6 g/dL — ABNORMAL LOW (ref 3.5–5.0)
Anion gap: 10 (ref 5–15)
BUN: 12 mg/dL (ref 6–20)
CO2: 18 mmol/L — ABNORMAL LOW (ref 22–32)
Calcium: 7.4 mg/dL — ABNORMAL LOW (ref 8.9–10.3)
Chloride: 112 mmol/L — ABNORMAL HIGH (ref 98–111)
Creatinine, Ser: 0.99 mg/dL (ref 0.44–1.00)
GFR, Estimated: >60 mL/min (ref >60)
Glucose, Bld: 85 mg/dL (ref 70–99)
Phosphorus: 2.9 mg/dL (ref 2.5–4.6)
Potassium: 3.7 mmol/L (ref 3.5–5.1)
Sodium: 140 mmol/L (ref 135–145)

## 2023-06-22 LAB — SURGICAL PATHOLOGY

## 2023-06-22 MED ORDER — AMOXICILLIN-POT CLAVULANATE 875-125 MG PO TABS
1.0000 | ORAL_TABLET | Freq: Two times a day (BID) | ORAL | 0 refills | Status: AC
  Filled 2023-06-22: qty 5, 3d supply, fill #0

## 2023-06-22 NOTE — Progress Notes (Signed)
    Durable Medical Equipment  (From admission, onward)           Start     Ordered   06/22/23 1404  For home use only DME Bedside commode  Once       Comments: Patient needs 3:1  Question Answer Comment  Patient needs a bedside commode to treat with the following condition Physical deconditioning   Patient needs a bedside commode to treat with the following condition Hypokalemia      06/22/23 1404   06/16/23 1604  For home use only DME Walker rolling  Once       Comments: Pediatric  Question Answer Comment  Walker: With 5 Inch Wheels   Patient needs a walker to treat with the following condition Weakness      06/16/23 1603

## 2023-06-22 NOTE — TOC Progression Note (Signed)
 Transition of Care [facility]) - Progression Note    Patient Details  Name: CEAIRRA DOLLENS MRN: [id number] Date of Birth: 1965/06/12  [facility] CM/SW Contact  Dane Dung, RN Phone Number: 06/22/2023, 2:18 PM  Clinical Narrative:    CM met with the patient at the bedside to discuss TOC needs.  Patient refuses SNf placement.  Patient states that she lives with her significant other at the house.   Patient states that they have lived with each other for 29 years.  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  Patient states they both drive an are not homebound.  Patient declines wheelchair and requests 3:1 and RW.   Patient did not have a preference for DME company.  I called [employer] and requested delivery of Peds RW and 3:1 to hospital room.  Patient will not qualify for PCS services since patient is not homebound.  CM will continue to follow the patient for likely discharge to home tomorrow.   Expected Discharge Plan: OP Rehab Barriers to Discharge: Continued Medical Work up  Expected Discharge Plan and Services     Post Acute Care Choice: Durable Medical Equipment Living arrangements for the past 2 months: Single Family Home                                       Social Determinants of Health (SDOH) Interventions SDOH Screenings   Food Insecurity: No Food Insecurity (06/14/2023)  Housing: Low Risk  (06/14/2023)  Transportation Needs: No Transportation Needs (06/14/2023)  Utilities: Not At Risk (06/14/2023)  Depression (PHQ2-9): Low Risk  (06/06/2023)  Tobacco Use: Medium Risk (06/21/2023)    Readmission Risk Interventions    06/22/2023    2:16 PM 11/21/2022    1:01 PM  Readmission Risk Prevention Plan  Transportation Screening Complete Complete  Medication Review [employer]) Complete Complete  PCP or Specialist appointment within 3-5 days of discharge  Complete Complete  HRI or Home Care Consult Complete Complete  SW Recovery Care/Counseling Consult Complete   Palliative Care Screening Not Applicable Not Applicable  Skilled Nursing Facility Patient Refused Not Applicable

## 2023-06-22 NOTE — Progress Notes (Signed)
 Occupational Therapy Treatment Patient Details Name: Christina Byrd MRN: [id number] DOB: [birth year] Today's Date: 06/22/2023   History of present illness 58 y.o. female presents to [facility] with worsening dysphagia over 3 days. Admitted with failure to thrive in setting of chronic EtOH abuse, AKI, and hypocalcemia. 4/28 EGD. 4/29 colonoscopy and upper GI endoscopy. 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 Pt initially needed assist for STS efforts, was able to progress with CGA to rise into standing without OT assist. Educated pt on pre-mobility exercises to perform for BLEs before making attempts, she needs UE support to push off of to rise, would greatly benefit from [facility] at home. OT to continue efforts to progress pt as able. DC plans update to Outpatient as she will not qualify for skilled rehab services, although skilled rehab <3hrs will allow for pt to safely progress closer to baseline as opposed to returning home.       If plan is discharge home, recommend the following:  Assistance with cooking/housework;Assist for transportation;Help with stairs or ramp for entrance;A little help with bathing/dressing/bathroom;A little help with walking and/or transfers   Equipment Recommendations  BSC/3in1;Other (comment) (RW)    Recommendations for Other Services      Precautions / Restrictions Precautions Precautions: Fall Restrictions Weight Bearing Restrictions Per Provider Order: No       Mobility Bed Mobility               General bed mobility comments: Pt in recliner on arrival    Transfers Overall transfer level: Needs assistance Equipment used: Rolling walker (2 wheels) Transfers: Sit to/from Stand Sit to Stand: Min assist           General transfer comment: Initially needed min  A to rise from recliner despite cues for pt to use rocking momentum. After mobilizing pt able to stand from recliner with CGA, educated pt on premobility exercises to perform prior to STS attempts.     Balance Overall balance assessment: Needs assistance, Mild deficits observed, not formally tested Sitting-balance support: No upper extremity supported, Feet supported Sitting balance-Leahy Scale: Fair     Standing balance support: Single extremity supported, During functional activity Standing balance-Leahy Scale: Fair                             ADL either performed or assessed with clinical judgement   ADL                       Lower Body Dressing:  (donning socks/shoes with setup. Anticipate more with STS assist.)   Toilet Transfer: Contact guard assist;Rolling walker (2 wheels);BSC/3in1;Ambulation             General ADL Comments: Focused session on re-assessing mobility and DME needs per MD orders    Extremity/Trunk Assessment              Vision       Perception     Praxis     Communication Communication Communication: No apparent difficulties Factors Affecting Communication: Reduced clarity of speech   Cognition Arousal: Alert Behavior During Therapy: WFL for tasks assessed/performed Cognition: Cognition impaired, No family/caregiver present to determine baseline     Awareness: Intellectual awareness intact, Online awareness impaired   Attention impairment (  select first level of impairment): Selective attention Executive functioning impairment (select all impairments): Reasoning, Problem solving OT - Cognition Comments: Pt seems to have a decreased awareness of her deficits                 Following commands: Impaired Following commands impaired: Follows one step commands with increased time      Cueing   Cueing Techniques: Verbal cues, Tactile cues, Visual cues  Exercises      Shoulder Instructions       General  Comments      Pertinent Vitals/ Pain       Pain Assessment Pain Assessment: No/denies pain  Home Living                                          Prior Functioning/Environment              Frequency  Min 2X/week        Progress Toward Goals  OT Goals(current goals can now be found in the care plan section)  Progress towards OT goals: Progressing toward goals  Acute Rehab OT Goals Patient Stated Goal: To get stronger OT Goal Formulation: With patient Time For Goal Achievement: 06/29/23 Potential to Achieve Goals: Good  Plan      Co-evaluation                 AM-PAC OT "6 Clicks" Daily Activity     Outcome Measure   Help from another person eating meals?: None Help from another person taking care of personal grooming?: A Little Help from another person toileting, which includes using toliet, bedpan, or urinal?: A Little Help from another person bathing (including washing, rinsing, drying)?: A Lot Help from another person to put on and taking off regular upper body clothing?: A Little Help from another person to put on and taking off regular lower body clothing?: A Lot 6 Click Score: 17    End of Session Equipment Utilized During Treatment: Gait belt;Rolling walker (2 wheels)  OT Visit Diagnosis: Muscle weakness (generalized) (M62.81);Other (comment);Adult, failure to thrive (R62.7) (decreased activity tolerance)   Activity Tolerance Patient tolerated treatment well   Patient Left in chair;with call bell/phone within reach   Nurse Communication Mobility status        Time: 1245-1310 OT Time Calculation (min): 25 min  Charges: OT General Charges $OT Visit: 1 Visit OT Treatments $Therapeutic Activity: 23-37 mins  06/22/2023  AB, OTR/L  Acute Rehabilitation Services  Office: [phone]   Jorene New 06/22/2023, 2:20 PM

## 2023-06-22 NOTE — Progress Notes (Addendum)
 Physical Therapy Treatment Patient Details Name: Christina Byrd MRN: [id number] DOB: August 24, 1965 Today's Date: 06/22/2023   History of Present Illness 58 y.o. female presents to [facility] with worsening dysphagia over 3 days. Admitted with failure to thrive in setting of chronic EtOH abuse, AKI, and hypocalcemia. 4/28 EGD. 4/29 colonoscopy and upper GI endoscopy. PMHx: alcohol use disorder, withdrawal seizures, hypothyroidism, recurrent GI bleed secondary to AVM, chronic dysphagia    PT Comments  Pt in bed upon arrival and agreeable to PT session. Pt continues to require ModA for bed mobility for LE management with pt stating "I can't move my legs". Pt was able to stand with MinA for boost-up and 1HH. She was then able to ambulate two sets of x6 ft with 1HH and MinA. She was slightly unsteady and would benefit from trial of RW in future sessions. Pt is working on having more assistance available at home as she currently has intermittent assistance. Pt is currently requiring physical assist for all mobility and would benefit from <3hrs post acute rehab. Pt is progressing well towards goals. Acute PT to follow.      If plan is discharge home, recommend the following: A lot of help with walking and/or transfers;A lot of help with bathing/dressing/bathroom;Assistance with cooking/housework;Assist for transportation;Help with stairs or ramp for entrance   Can travel by private vehicle      Yes  Equipment Recommendations  Rolling walker (2 wheels);BSC/3in1;Wheelchair (measurements PT);Wheelchair cushion (measurements PT) (youth RW)       Precautions / Restrictions Precautions Precautions: Fall Restrictions Weight Bearing Restrictions Per Provider Order: No     Mobility  Bed Mobility Overal bed mobility: Needs Assistance Bed Mobility: Supine to Sit    Supine to sit: Mod assist, Used rails    General bed mobility comments: uses B rails to move into long-sitting, requested assistance to move  BLE's off EOB.    Transfers Overall transfer level: Needs assistance Equipment used: 1 person hand held assist Transfers: Sit to/from Stand, Bed to chair/wheelchair/BSC Sit to Stand: Min assist   Step pivot transfers: Min assist     General transfer comment: MinA for boost-up from EOB. Attempt with no physical assist, however, pt unable to clear bottom    Ambulation/Gait Ambulation/Gait assistance: Min assist Gait Distance (Feet): 6 Feet Assistive device: 1 person hand held assist Gait Pattern/deviations: Step-through pattern, Decreased stride length Gait velocity: decr     General Gait Details: MinA for stability via 1HH as pt was mildly unsteady    Balance Overall balance assessment: Needs assistance, Mild deficits observed, not formally tested Sitting-balance support: No upper extremity supported, Feet supported Sitting balance-Leahy Scale: Fair     Standing balance support: Single extremity supported, During functional activity Standing balance-Leahy Scale: Fair Standing balance comment: able to stand statically with no UE support         Communication Communication Communication: Impaired Factors Affecting Communication: Reduced clarity of speech  Cognition Arousal: Alert Behavior During Therapy: WFL for tasks assessed/performed   PT - Cognitive impairments: No family/caregiver present to determine baseline      PT - Cognition Comments: inconsistent with PLOF/symptoms that she is unable to stand/walk or move legs. Following commands: Impaired Following commands impaired: Follows one step commands with increased time, Only follows one step commands consistently, Follows multi-step commands inconsistently    Cueing Cueing Techniques: Verbal cues, Tactile cues, Visual cues  Exercises General Exercises - Lower Extremity Long Arc Quad: AROM, Both, 10 reps, Seated (x3 sec hold) Hip  Flexion/Marching: AROM, Both, 10 reps, Seated        Pertinent Vitals/Pain  Pain Assessment Pain Assessment: No/denies pain     PT Goals (current goals can now be found in the care plan section) Acute Rehab PT Goals PT Goal Formulation: With patient Time For Goal Achievement: 06/29/23 Potential to Achieve Goals: Fair Progress towards PT goals: Progressing toward goals    Frequency    Min 2X/week          AM-PAC PT "6 Clicks" Mobility   Outcome Measure  Help needed turning from your back to your side while in a flat bed without using bedrails?: A Lot Help needed moving from lying on your back to sitting on the side of a flat bed without using bedrails?: A Lot Help needed moving to and from a bed to a chair (including a wheelchair)?: A Little Help needed standing up from a chair using your arms (e.g., wheelchair or bedside chair)?: A Little Help needed to walk in hospital room?: A Lot Help needed climbing 3-5 steps with a railing? : Total 6 Click Score: 13    End of Session Equipment Utilized During Treatment: Gait belt Activity Tolerance: Patient tolerated treatment well Patient left: in chair;with call bell/phone within reach Nurse Communication: Mobility status PT Visit Diagnosis: Other abnormalities of gait and mobility (R26.89);Muscle weakness (generalized) (M62.81)     Time: 1610-9604 PT Time Calculation (min) (ACUTE ONLY): 26 min  Charges:    $Gait Training: 8-22 mins $Therapeutic Exercise: 8-22 mins PT General Charges $$ ACUTE PT VISIT: 1 Visit                     Orysia Blas, PT, DPT Secure Chat Preferred  Rehab Office [phone]    Alissa April Adela Ades 06/22/2023, 12:55 PM

## 2023-06-22 NOTE — Progress Notes (Signed)
 Physical Therapy Treatment Patient Details Name: Christina Byrd MRN: [id number] DOB: 1966/01/09 Today's Date: 06/22/2023   History of Present Illness 58 y.o. female presents to [facility] with worsening dysphagia over 3 days. Admitted with failure to thrive in setting of chronic EtOH abuse, AKI, and hypocalcemia. 4/28 EGD. 4/29 colonoscopy and upper GI endoscopy. PMHx: alcohol use disorder, withdrawal seizures, hypothyroidism, recurrent GI bleed secondary to AVM, chronic dysphagia    PT Comments  Follow-up PT session as pt had updated d/c plans to discharge home with OP services as she would not qualify for skilled rehab <3hrs. Pt made significant progress towards goals. She was able to ascend/descend 4 steps with MinA to boost-up and ambulate 120 ft with RW and CGA. Pt stated that she can have multiple family members/friends come over if she calls for assistance. Educated on safety with stair negotiation and proper guarding with pt verbalizing understanding. Pt feels comfortable and safe discharging home at current mobility level. Acute PT to continue to follow.   If plan is discharge home, recommend the following: A little help with walking and/or transfers;A little help with bathing/dressing/bathroom;Assist for transportation;Help with stairs or ramp for entrance;Assistance with cooking/housework   Can travel by private vehicle      Yes  Equipment Recommendations  Rolling walker (2 wheels);BSC/3in1;Wheelchair (measurements PT);Wheelchair cushion (measurements PT)       Precautions / Restrictions Precautions Precautions: Fall Restrictions Weight Bearing Restrictions Per Provider Order: No     Mobility  Bed Mobility Overal bed mobility: Needs Assistance Bed Mobility: Supine to Sit    Supine to sit: Min assist    General bed mobility comments: MinA to shift hips forward    Transfers Overall transfer level: Needs assistance Equipment used: Rolling walker (2 wheels) Transfers: Sit  to/from Stand Sit to Stand: Contact guard assist   Step pivot transfers: Min assist     General transfer comment: used momentum with CGA for safety    Ambulation/Gait Ambulation/Gait assistance: Contact guard assist Gait Distance (Feet): 120 Feet Assistive device: Rolling walker (2 wheels) Gait Pattern/deviations: Step-through pattern, Decreased stride length Gait velocity: decr    General Gait Details: CGA fpr safety, steady with no overt LOB   Stairs Stairs: Yes Stairs assistance: Min assist Stair Management: One rail Right, Step to pattern, Forwards, Backwards, Sideways Number of Stairs: 4 General stair comments: practiced sideways step to pattern with B UE on rail and forwards step-to pattern with 1 UE on rail. Pt preferred forwards step-to pattern. MinA for slight boost-up   Balance Overall balance assessment: Needs assistance, Mild deficits observed, not formally tested Sitting-balance support: No upper extremity supported, Feet supported Sitting balance-Leahy Scale: Fair     Standing balance support: Bilateral upper extremity supported, During functional activity, Reliant on assistive device for balance Standing balance-Leahy Scale: Fair Standing balance comment: able to stand statically, RW for gait       Communication Communication Communication: No apparent difficulties Factors Affecting Communication: Reduced clarity of speech  Cognition Arousal: Alert Behavior During Therapy: WFL for tasks assessed/performed   PT - Cognitive impairments: No family/caregiver present to determine baseline  PT - Cognition Comments: inconsistent with PLOF/symptoms that she is unable to stand/walk or move legs. Following commands: Impaired Following commands impaired: Follows one step commands with increased time    Cueing Cueing Techniques: Verbal cues, Tactile cues, Visual cues  Exercises General Exercises - Lower Extremity Long Arc Quad: AROM, Both, 10 reps, Seated (x3 sec  hold) Hip Flexion/Marching: AROM, Both, 10  reps, Seated        Pertinent Vitals/Pain Pain Assessment Pain Assessment: No/denies pain     PT Goals (current goals can now be found in the care plan section) Acute Rehab PT Goals PT Goal Formulation: With patient Time For Goal Achievement: 06/29/23 Potential to Achieve Goals: Fair Progress towards PT goals: Progressing toward goals    Frequency    Min 2X/week       AM-PAC PT "6 Clicks" Mobility   Outcome Measure  Help needed turning from your back to your side while in a flat bed without using bedrails?: A Little Help needed moving from lying on your back to sitting on the side of a flat bed without using bedrails?: A Little Help needed moving to and from a bed to a chair (including a wheelchair)?: A Little Help needed standing up from a chair using your arms (e.g., wheelchair or bedside chair)?: A Little Help needed to walk in hospital room?: A Little Help needed climbing 3-5 steps with a railing? : A Little 6 Click Score: 18    End of Session Equipment Utilized During Treatment: Gait belt Activity Tolerance: Patient tolerated treatment well Patient left: in bed;with call bell/phone within reach;with nursing/sitter in room;with bed alarm set Nurse Communication: Mobility status PT Visit Diagnosis: Other abnormalities of gait and mobility (R26.89);Muscle weakness (generalized) (M62.81)     Time: 1610-9604 PT Time Calculation (min) (ACUTE ONLY): 23 min  Charges:    $Gait Training: 8-22 mins $Therapeutic Exercise: 8-22 mins $Therapeutic Activity: 8-22 mins PT General Charges $$ ACUTE PT VISIT: 1 Visit                     Orysia Blas, PT, DPT Secure Chat Preferred  Rehab Office [phone]   Alissa April Adela Ades 06/22/2023, 4:06 PM

## 2023-06-22 NOTE — Progress Notes (Signed)
 HD#9 SUBJECTIVE:  Patient Summary: Christina Byrd is a 58 y.o. 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   Overnight Events: No acute events overnight  Interim History: Patient was evaluated bedside. Diarrhea and abdominal pain improving. She was able to tolerate p.o. feeds yesterday. She continues to endorse weakness in her legs so discussed reevaluation with therapy today for most appropriate discharge.  OBJECTIVE:  Vital Signs: Vitals:   06/21/23 2325 06/22/23 0523 06/22/23 0804 06/22/23 1225  BP: 105/79 114/84 120/84 120/87  Pulse: 98 94 97 75  Resp: 18 18 17 19   Temp: 97.9 F (36.6 C) 98.7 F (37.1 C) 97.9 F (36.6 C) 98.7 F (37.1 C)  TempSrc: Oral Oral  Oral  SpO2: 100% 99% 99%   Weight:      Height:       Supplemental O2: Room Air SpO2: 99 % O2 Flow Rate (L/min): 0 L/min  Filed Weights   06/15/23 0334 06/21/23 0524 06/21/23 0845  Weight: 47.8 kg 56.3 kg 56.3 kg    No intake or output data in the 24 hours ending 06/22/23 1240  Net IO Since Admission: 2,317.67 mL [06/22/23 1240]  CBC    Component Value Date/Time   WBC 8.9 06/22/2023 0639   RBC 2.97 (L) 06/22/2023 0639   HGB 9.4 (L) 06/22/2023 0639   HGB 12.4 04/27/2022 1125   HCT 27.4 (L) 06/22/2023 0639   HCT 37.6 04/27/2022 1125   PLT 205 06/22/2023 0639   PLT 210 04/27/2022 1125   MCV 92.3 06/22/2023 0639   MCV 97 04/27/2022 1125   MCH 31.6 06/22/2023 0639   MCHC 34.3 06/22/2023 0639   RDW 18.1 (H) 06/22/2023 0639   RDW 17.0 (H) 04/27/2022 1125   LYMPHSABS 1.5 06/22/2023 0639   LYMPHSABS 1.5 04/27/2022 1125   MONOABS 1.0 06/22/2023 0639   EOSABS 0.1 06/22/2023 0639   EOSABS 0.0 04/27/2022 1125   BASOSABS 0.0 06/22/2023 0639   BASOSABS 0.0 04/27/2022 1125   CMP     Component Value Date/Time   NA 140 06/22/2023 0639   NA 144  04/27/2022 1125   K 3.7 06/22/2023 0639   CL 112 (H) 06/22/2023 0639   CO2 18 (L) 06/22/2023 0639   GLUCOSE 85 06/22/2023 0639   BUN 12 06/22/2023 0639   BUN 7 04/27/2022 1125   CREATININE 0.99 06/22/2023 0639   CREATININE 1.00 03/21/2014 1556   CALCIUM  7.4 (L) 06/22/2023 0639   PROT 6.2 (L) 06/13/2023 1518   PROT 7.1 04/27/2022 1125   ALBUMIN 1.6 (L) 06/22/2023 0639   ALBUMIN 3.5 (L) 04/27/2022 1125   AST 76 (H) 06/13/2023 1518   ALT 38 06/13/2023 1518   ALKPHOS 164 (H) 06/13/2023 1518   BILITOT 0.6 06/13/2023 1518   BILITOT 0.6 04/27/2022 1125   GFR 76.32 12/18/2012 1506   EGFR 82 04/27/2022 1125   GFRNONAA >60 06/22/2023 0639   GFRNONAA >89 12/11/2013 1214   Physical Exam Constitutional:      Appearance: Normal appearance.  HENT:     Head: Normocephalic and atraumatic.  Cardiovascular:     Rate and Rhythm: Normal rate and regular rhythm.  Pulmonary:     Effort: Pulmonary effort is normal.     Breath sounds: Normal breath sounds.  Abdominal:     General: Abdomen is flat. Bowel sounds are normal.     Palpations:  Abdomen is soft.  Neurological:     General: No focal deficit present.     Mental Status: She is alert. Mental status is at baseline.  Psychiatric:        Mood and Affect: Mood normal.        Behavior: Behavior normal.   ASSESSMENT/PLAN:  Assessment: Principal Problem:   Hypocalcemia Active Problems:   Alcohol withdrawal syndrome without complication (HCC)   Anemia   Protein-calorie malnutrition, severe (HCC)   AKI (acute kidney injury) (HCC)   Dehydration   Aspiration pneumonia of left lower lobe (HCC)   History of non anemic vitamin B12 deficiency   Physical deconditioning  Plan: #Failure to Thrive #Hypocalcemia, improving #Diarrhea, improving  #NAGMA, improving Corrected calcium  stable at 8.9 this morning.  Bicarb also stable at 18.  Patient continues to tolerate her diet advancement well.  Will continue to encourage increased hydration and  p.o. intake.  Patient is medically stable to go home.  However, after evaluation by physical therapy, they are recommending SNF as she has minimal support at home and continues to require moderate assistance with mobility.  Will follow-up with social work regarding authorization of this. - Follow-up social work Scientist, forensic - Progress diet as tolerated - Daily RFPs - Encourage hydration  - Encourage ambulation - Continue imodium    #Tachycardia, improving   #Aspiration PNA, improving Stable.  Heart rate remains in the 90s.  Leukocytosis stable at 8.9.  Currently on day 5 of 7 of her antibiotics so we will continue this for a total treatment duration of 7 days.  Prior to discharge, will transition to Augmentin  for treatment completion. - Continue Zosyn (day 5/7) - Trend CBC, fever curve  #Alcohol Use Disorder #Hx of DT Tachycardia improving.  Continues to tolerate p.o. Keppra . - Continue Keppra  - CIWA   #Anemia of Chronic Disease Hemoglobin stable at 9.4.  GI completed EGD and colonoscopy yesterday which was largely unremarkable.  They collected biopsy specimens for possible microscopic colitis so we will follow these up. -Follow-up pathology  Best Practice: Diet: Soft VTE: rivaroxaban (XARELTO) tablet 10 mg Start: 06/20/23 1700 Code: Full AB: Zosyn DISPO: Anticipated discharge  TBD  to SNF pending  insurance authorization .  Signature: Maxie Spaniel, MD Internal Medicine Resident, PGY-1 [facility]   Please contact the on call pager after 5 pm and on weekends at [phone].

## 2023-06-22 NOTE — Plan of Care (Signed)
 Assumed care at 1900. Pt has been resting comfortably in bed overnight. Pt has been expressing neuropathy pain in her feet and knees overnight. The patient continues to have been having watery loose stools. Fall precautions in place. No events overnight.   Problem: Education: Goal: Knowledge of General Education information will improve Description: Including pain rating scale, medication(s)/side effects and non-pharmacologic comfort measures Outcome: Progressing   Problem: Health Behavior/Discharge Planning: Goal: Ability to manage health-related needs will improve Outcome: Progressing   Problem: Clinical Measurements: Goal: Ability to maintain clinical measurements within normal limits will improve Outcome: Progressing Goal: Will remain free from infection Outcome: Progressing Goal: Diagnostic test results will improve Outcome: Progressing Goal: Respiratory complications will improve Outcome: Progressing Goal: Cardiovascular complication will be avoided Outcome: Progressing   Problem: Activity: Goal: Risk for activity intolerance will decrease Outcome: Progressing   Problem: Nutrition: Goal: Adequate nutrition will be maintained Outcome: Progressing   Problem: Coping: Goal: Level of anxiety will decrease Outcome: Progressing   Problem: Elimination: Goal: Will not experience complications related to bowel motility Outcome: Progressing Goal: Will not experience complications related to urinary retention Outcome: Progressing   Problem: Pain Managment: Goal: General experience of comfort will improve and/or be controlled Outcome: Progressing   Problem: Safety: Goal: Ability to remain free from injury will improve Outcome: Progressing   Problem: Skin Integrity: Goal: Risk for impaired skin integrity will decrease Outcome: Progressing   Problem: Education: Goal: Knowledge of disease or condition will improve Outcome: Progressing Goal: Understanding of discharge  needs will improve Outcome: Progressing   Problem: Health Behavior/Discharge Planning: Goal: Ability to identify changes in lifestyle to reduce recurrence of condition will improve Outcome: Progressing   Problem: Physical Regulation: Goal: Complications related to the disease process, condition or treatment will be avoided or minimized Outcome: Progressing   Problem: Safety: Goal: Ability to remain free from injury will improve Outcome: Progressing

## 2023-06-22 NOTE — Discharge Summary (Signed)
 Name: Christina Byrd MRN: [id number] DOB: 02/25/1965 58 y.o. PCP: Carleen Chary, DO  Date of Admission: 06/13/2023  2:29 PM Date of Discharge:  06/22/23 Attending Physician: Dr.  Alwin Baars  DISCHARGE DIAGNOSIS:  Primary Problem: Hypocalcemia   Hospital Problems: Principal Problem:   Hypocalcemia Active Problems:   Alcohol withdrawal syndrome without complication (HCC)   Anemia   Protein-calorie malnutrition, severe (HCC)   AKI (acute kidney injury) (HCC)   Dehydration   Aspiration pneumonia of left lower lobe (HCC)   History of non anemic vitamin B12 deficiency   Physical deconditioning    DISCHARGE MEDICATIONS:   Allergies as of 06/22/2023       Reactions   Losartan  Potassium    Rash   Levaquin  [levofloxacin  In D5w] Rash   Ace Inhibitors Cough   REACTION: cough   Codeine  Nausea Only   Cefepime  Rash   09/04/12 pm Patient started to break out with small macules after IV Vanco infusion, then macules increased in size after starting cefepime  infusion.   Chlorhexidine Itching, Rash   Vancomycin  Rash   09/04/12 pm Patient started to break out with small macules after IV Vanco infusion, then macules increased in size after starting cefepime  infusion.        Medication List     STOP taking these medications    potassium chloride  SA 20 MEQ tablet Commonly known as: KLOR-CON  M       TAKE these medications    allopurinol  100 MG tablet Commonly known as: ZYLOPRIM  Take 2 tablets (200 mg total) by mouth daily.   amoxicillin -clavulanate 875-125 MG tablet Commonly known as: AUGMENTIN  Take 1 tablet by mouth 2 (two) times daily for 5 doses.   atorvastatin  20 MG tablet Commonly known as: LIPITOR Take 1 tablet (20 mg total) by mouth daily.   cetirizine  10 MG tablet Commonly known as: ZyrTEC  Allergy Take 1 tablet (10 mg total) by mouth daily.   colchicine  0.6 MG tablet Take 1 tablet (0.6 mg total) by mouth daily.   DULoxetine  30 MG capsule Commonly  known as: Cymbalta  Take 1 capsule (30 mg total) by mouth daily.   ergocalciferol  1.25 MG (50000 UT) capsule Commonly known as: VITAMIN D2 Take 1 capsule by mouth once a week. Every Wednesday starting 11/13   etodolac  400 MG tablet Commonly known as: Lodine  Take 1 tablet (400 mg total) by mouth 2 (two) times daily.   feeding supplement Liqd Take 237 mLs by mouth 3 (three) times daily between meals. What changed: when to take this   fluticasone  50 MCG/ACT nasal spray Commonly known as: FLONASE  Use one spray in each nostril daily What changed:  how much to take how to take this when to take this   folic acid  1 MG tablet Commonly known as: FOLVITE  Take 1 tablet (1 mg total) by mouth daily.   gabapentin  100 MG capsule Commonly known as: Neurontin  Take 1 capsule (100 mg total) by mouth 3 (three) times daily.   Gerhardt's butt cream Crea Apply 1 Application topically 4 (four) times daily -  before meals and at bedtime.   hydrocortisone  cream 1 % Apply 1 Application topically 2 (two) times daily. What changed:  when to take this reasons to take this   levETIRAcetam  750 MG tablet Commonly known as: KEPPRA  Take 1 tablet (750 mg total) by mouth 2 (two) times daily.   multivitamin with minerals Tabs tablet Take 1 tablet by mouth daily.   ondansetron  4 MG tablet Commonly known as:  Zofran  Take 1 tablet (4 mg total) by mouth every 8 (eight) hours as needed for nausea or vomiting.   pantoprazole  40 MG tablet Commonly known as: Protonix  Take 1 tablet (40 mg total) by mouth 2 (two) times daily.   thiamine  100 MG tablet Commonly known as: VITAMIN B1 Take 1 tablet (100 mg total) by mouth daily.   Zinc  Oxide 12.8 % ointment Commonly known as: TRIPLE PASTE Apply topically as needed for irritation. In your buttocks               Durable Medical Equipment  (From admission, onward)           Start     Ordered   06/22/23 1404  For home use only DME Bedside commode   Once       Comments: Patient needs 3:1  Question Answer Comment  Patient needs a bedside commode to treat with the following condition Physical deconditioning   Patient needs a bedside commode to treat with the following condition Hypokalemia      06/22/23 1404   06/16/23 1604  For home use only DME Walker rolling  Once       Comments: Pediatric  Question Answer Comment  Walker: With 5 Inch Wheels   Patient needs a walker to treat with the following condition Weakness      06/16/23 1603            DISPOSITION AND FOLLOW-UP:  Christina Byrd was discharged from [facility] in Stable condition. At the hospital follow up visit please address:  Follow-up Recommendations: Consults: None Labs: CBC and Kidney Profile Studies: Bx for microscopic colitis Medications: Augmentin   Follow-up Appointments:  Follow-up Information     [facility]. Schedule an appointment as soon as possible for a visit.   Specialty: Rehabilitation Why: Call to [facility] for physical and occupational thearpy Contact information: [address] [place]  [address] [phone]        [facility] Follow up.   Why: [employer] will be providing a RW and 3:1 before you go home. Contact information: [address] [place] [address] [phone]                 HOSPITAL COURSE:  Patient Summary: #Chronic Dysphagia  Prior to admission, underwent extensive workup in the past. GI endoscopy in November 2020 was unremarkable and recommended PPI and alcohol cessation with outpatient GI follow-up. Christina Byrd was unable to make this appointment. Initially remained NPO with a speech evaluation. EGD in April 2024 demonstrated no esophageal abnormality but noted to have had a cratered duodenal ulcer. GI consulted with concerns for worsening dysphagia. Barium swallow yesterday demonstrated normal swallowing, normal appearance  of the esophagus, poor primary peristaltic wave with incomplete esophageal emptying, small hiatal hernia, and evidence of spontaneous GERD with regurgitation.  GI signed off. Re-evaluated by speech who signed off as her swallowing difficulties fall within the realm of the esophagus.  Advancement of diet was well tolerated. Remained tachycardic but thought to have been from hypovolemia rather than dehydration so given some fluids.   #Aspiration pneumonia #Tachycardia Etiology initially unclear.  Thought to have been secondary to hypovolemia from poor p.o. intake from chronic diarrhea; however, Christina Byrd was been given multiple boluses and tachycardia, and this did not resolve.There was some concern for shortness of breath, as well as expiratory wheezes, so chest x-ray was obtained which showed bilateral atelectasis; however could not rule out  left lower lobe pneumonia. Christina Byrd was very high risk for aspiration given her chronic dysphagia, and the timeline matched up with aspiration pneumonia as well. WBC was also elevated at 13.2, so Christina Byrd was started on a 7-day course of antibiotics. Transitioned from Zosyn to Augmentin  at discharge.   #Hypocalcemia #AKI #NAGMA Suspected to have been in the context of decreased PO intake in days prior to admission likely exacerbated by her chronic dysphagia. Initial corrected calcium  was 6.6 with creatinine of 1.4 (baseline ~0.9). Given fluids with electrolyte replenishment. NAGMA likely in setting of starvation of diarrhea.  Follow-up renal function panel outpatient.  #Hx of Alcohol Use Disorder #Hx of Alcohol Withdrawal Seizures Last drink was prior morning of admission. History of withdrawal seizures in which Christina Byrd takes Keppra  750 mg BID. As Christina Byrd was unable to swallow pills, Christina Byrd was transitioned to IV Keppra  and CIWA with Ativan . Her CIWA was intermittently high with episodes of tachycardia requiring Ativan . Stable at discharge and recommend continued encourage of alcohol  cessation.   #Normocytic Anemia  Hemoglobin on admission was 8.6 with MCV of 91.4. Hemoglobin dropped to 6.7 requiring unit of blood. Remained stable around 9 throughout the rest of her hospitalization.  Hemoglobin again dropped to 6.7 requiring an additional unit of blood.  GI was reconsulted and completed an EGD and colonoscopy, which were largely unremarkable.  They collected samples with concerns for microscopic colitis.  Follow-up outpatient.  #Chronic Gout  Held home allopurinol  and colchicine  given poor PO intake.  Follow-up outpatient     DISCHARGE INSTRUCTIONS:   Discharge Instructions     Ambulatory referral to Occupational Therapy   Complete by: As directed    Ambulatory referral to Physical Therapy   Complete by: As directed    Ambulatory referral to Physical Therapy   Complete by: As directed    Iontophoresis - 4 mg/ml of dexamethasone : No   T.E.N.S. Unit Evaluation and Dispense as Indicated: No   Call MD for:  difficulty breathing, headache or visual disturbances   Complete by: As directed    Call MD for:  extreme fatigue   Complete by: As directed    Call MD for:  hives   Complete by: As directed    Call MD for:  persistant dizziness or light-headedness   Complete by: As directed    Call MD for:  persistant nausea and vomiting   Complete by: As directed    Call MD for:  redness, tenderness, or signs of infection (pain, swelling, redness, odor or green/yellow discharge around incision site)   Complete by: As directed    Call MD for:  severe uncontrolled pain   Complete by: As directed    Call MD for:  temperature >100.4   Complete by: As directed    Diet - low sodium heart healthy   Complete by: As directed    Discharge instructions   Complete by: As directed    Christina Byrd, Christina Byrd were hospitalized for problems swallowing and for electrolyte abnormalities. You were seen by the GI doctors who performed an endoscopy and colonoscopy. At this time, we feel that you  are safe to go home.   Please *START* taking the following medication:  - Augmentin  875-125 mg two times per day (one more today (06/22/23) followed by two times per day tomorrow (06/23/23); your final dose will be on 06/24/23)  Please continue to take your other medications as prescribed. We have also scheduled for you to follow-up with physical therapy outpatient.  You are scheduled to follow-up in the clinic next week. If you have any questions, please call us  at [phone]. We are glad that you are feeling better!   Increase activity slowly   Complete by: As directed        SUBJECTIVE:  Patient was evaluated bedside.  Diarrhea and abdominal pain improving.  Christina Byrd was able to tolerate p.o. feeds yesterday.  Christina Byrd continues to endorse weakness in her legs so discussed reevaluation with therapy today for most appropriate discharge. Discharge Vitals:   BP 120/87 (BP Location: Left Arm)   Pulse 75   Temp 98.7 F (37.1 C) (Oral)   Resp 19   Ht 5\' 2"  (1.575 m)   Wt 56.3 kg   LMP 08/23/2011   SpO2 99%   BMI 22.70 kg/m   OBJECTIVE:  Physical Exam Constitutional:      Appearance: Normal appearance.  HENT:     Head: Normocephalic and atraumatic.  Cardiovascular:     Rate and Rhythm: Normal rate and regular rhythm.  Pulmonary:     Effort: Pulmonary effort is normal.     Breath sounds: Normal breath sounds.  Abdominal:     General: Abdomen is flat. Bowel sounds are normal.     Palpations: Abdomen is soft.  Neurological:     General: No focal deficit present.     Mental Status: Christina Byrd is alert. Mental status is at baseline.  Psychiatric:        Mood and Affect: Mood normal.        Behavior: Behavior normal.     Pertinent Labs, Studies, and Procedures:     Latest Ref Rng & Units 06/22/2023    6:39 AM 06/21/2023    4:56 AM 06/20/2023    6:08 PM  CBC  WBC 4.0 - 10.5 K/uL 8.9  7.8    Hemoglobin 12.0 - 15.0 g/dL 9.4  9.5  8.4   Hematocrit 36.0 - 46.0 % 27.4  28.3  24.6   Platelets 150 -  400 K/uL 205  187         Latest Ref Rng & Units 06/22/2023    6:39 AM 06/21/2023    4:56 AM 06/20/2023    5:24 AM  CMP  Glucose 70 - 99 mg/dL 85  73  81   BUN 6 - 20 mg/dL 12  11  12    Creatinine 0.44 - 1.00 mg/dL 6.21  3.08  6.57   Sodium 135 - 145 mmol/L 140  143  139   Potassium 3.5 - 5.1 mmol/L 3.7  3.9  4.4   Chloride 98 - 111 mmol/L 112  114  112   CO2 22 - 32 mmol/L 18  19  18    Calcium  8.9 - 10.3 mg/dL 7.4  7.5  7.2     DG Chest Port 1 View Result Date: 06/13/2023 CLINICAL DATA:  Dysphagia EXAM: PORTABLE CHEST 1 VIEW COMPARISON:  X-ray 12/23/2022 and older FINDINGS: Poor inflation. There is some linear opacity lung bases, right-greater-than-left. Atelectasis is favored. No consolidation, pneumothorax, effusion or edema. Normal cardiopericardial silhouette with tortuous ectatic aorta. Osteopenia. Degenerative changes. IMPRESSION: Underinflation with basilar atelectasis, right-greater-than-left. Electronically Signed   By: Adrianna Horde M.D.   On: 06/13/2023 18:11     Signed: Maxie Spaniel, MD Internal Medicine Resident, PGY-1 [facility]  Pager: [phone]

## 2023-06-24 ENCOUNTER — Encounter: Payer: Self-pay | Admitting: Internal Medicine

## 2023-06-29 NOTE — Progress Notes (Unsigned)
 CC: Hospital follow-up.  HPI:  [name] is a 58 y.o. female living with a history stated below and presents today for hospital follow-up.  She was recently hospitalized between 04/21-04/30 for worsening of dysphagia, and hypocalcemia. Barium swallow yesterday demonstrated normal swallowing, normal appearance of the esophagus, poor primary peristaltic wave with incomplete esophageal emptying, small hiatal hernia, and evidence of spontaneous GERD with regurgitation   Please see problem based assessment and plan for additional details.   Past Medical History:  Diagnosis Date   GASTROESOPHAGEAL REFLUX, NO ESOPHAGITIS 04/21/2006   Qualifier: Diagnosis of  By: WATT, JOANNE     GERD (gastroesophageal reflux disease) Dx 1995   Hot flash, menopausal 06/22/2016   Hot flashes 01/30/2020   HTN (hypertension)    Hypotension 10/21/2022   Patient's blood pressure at presentation was 87/65, follow up blood pressure was 81/58. Patient reports regurgitating everything she has taken po for the past month. Denies lightheadedness or dizziness. Discussed GI referral, though patient stated she would be going to the emergency room today for IV fluids.   -GI referral for upper endoscopy placed  -Patient planning on going to the ED for IV flu   Intertrigo 06/22/2016   Nausea & vomiting 12/23/2022   Nausea and vomiting 12/24/2022   Pain and swelling of left ankle 12/02/2014   Seizures (HCC)    Tendinitis of right hip flexor 07/23/2014   Tendonitis, Achilles, right 04/01/2014    Current Outpatient Medications on File Prior to Visit  Medication Sig Dispense Refill   allopurinol  (ZYLOPRIM ) 100 MG tablet Take 2 tablets (200 mg total) by mouth daily. 60 tablet 3   atorvastatin  (LIPITOR) 20 MG tablet Take 1 tablet (20 mg total) by mouth daily. 90 tablet 3   cetirizine  (ZYRTEC  ALLERGY) 10 MG tablet Take 1 tablet (10 mg total) by mouth daily. 30 tablet 2   colchicine  0.6 MG tablet Take 1 tablet (0.6  mg total) by mouth daily. (Patient not taking: Reported on 01/24/2023) 30 tablet 3   DULoxetine  (CYMBALTA ) 30 MG capsule Take 1 capsule (30 mg total) by mouth daily. 30 capsule 2   ergocalciferol  (VITAMIN D2) 1.25 MG (50000 UT) capsule Take 1 capsule by mouth once a week. Every Wednesday starting 11/13 7 capsule 0   etodolac  (LODINE ) 400 MG tablet Take 1 tablet (400 mg total) by mouth 2 (two) times daily. 20 tablet 0   feeding supplement (ENSURE ENLIVE / ENSURE PLUS) LIQD Take 237 mLs by mouth 3 (three) times daily between meals. (Patient taking differently: Take 237 mLs by mouth daily.) 237 mL 12   fluticasone  (FLONASE ) 50 MCG/ACT nasal spray Use one spray in each nostril daily (Patient taking differently: Place 1 spray into both nostrils daily. Use one spray in each nostril daily) 9.9 mL 2   folic acid  (FOLVITE ) 1 MG tablet Take 1 tablet (1 mg total) by mouth daily. 30 tablet 2   gabapentin  (NEURONTIN ) 100 MG capsule Take 1 capsule (100 mg total) by mouth 3 (three) times daily. 90 capsule 2   hydrocortisone  cream 1 % Apply 1 Application topically 2 (two) times daily. (Patient taking differently: Apply 1 Application topically daily as needed for itching.) 30 g 0   levETIRAcetam  (KEPPRA ) 750 MG tablet Take 1 tablet (750 mg total) by mouth 2 (two) times daily. 60 tablet 2   Multiple Vitamin (MULTIVITAMIN WITH MINERALS) TABS tablet Take 1 tablet by mouth daily. 30 tablet 0   Nystatin (GERHARDT'S BUTT CREAM) CREA Apply 1  Application topically 4 (four) times daily -  before meals and at bedtime. (Patient not taking: Reported on 01/24/2023) 1 each 0   ondansetron  (ZOFRAN ) 4 MG tablet Take 1 tablet (4 mg total) by mouth every 8 (eight) hours as needed for nausea or vomiting. 20 tablet 0   pantoprazole  (PROTONIX ) 40 MG tablet Take 1 tablet (40 mg total) by mouth 2 (two) times daily. 90 tablet 3   thiamine  (VITAMIN B1) 100 MG tablet Take 1 tablet (100 mg total) by mouth daily. 30 tablet 0   Zinc  Oxide (TRIPLE  PASTE) 12.8 % ointment Apply topically as needed for irritation. In your buttocks 56.7 g 0   [DISCONTINUED] metFORMIN  (GLUCOPHAGE ) 500 MG tablet Take 0.5 tablets (250 mg total) by mouth daily with breakfast. 45 tablet 6   No current facility-administered medications on file prior to visit.     Review of Systems: ROS negative except for what is noted on the assessment and plan.  There were no vitals filed for this visit.  {Labs (Optional):23779}  {Vitals History (Optional):23777}  Physical Exam: Constitutional: well-appearing ***  in no acute distress HENT: normocephalic atraumatic, mucous membranes moist Eyes: conjunctiva non-erythematous Cardiovascular: regular rate and rhythm, no m/r/g Pulmonary/Chest: normal work of breathing on room air, lungs clear to auscultation bilaterally Abdominal: soft, non-tender, non-distended MSK: normal bulk and tone Neurological: alert & oriented x 3, 5/5 strength in bilateral upper and lower extremities, normal gait Skin: warm and dry  Assessment & Plan:   #Chronic dysphagia   #AKI #NAGMA Likely in the setting of diarrhea.  SCr stable on discharge 0.90   #Normocytic anemia Hb on discharge 9.4. - Pathology results still pending, concern for microscopic colitis. -  No problem-specific Assessment & Plan notes found for this encounter.   Patient {GC/GE:3044014::"discussed with","seen with"} Dr. {ZOXWR:[id number]::"[id number]","G. Hoffman","Mullen","Narendra","Vincent","Guilloud","Lau","Machen"}  Marni Sins, MD [facility] Internal Medicine, PGY-1 Pager: [phone] Date 06/29/2023 Time 1:58 PM

## 2023-06-30 ENCOUNTER — Other Ambulatory Visit: Payer: Self-pay

## 2023-06-30 ENCOUNTER — Encounter: Admitting: Student

## 2023-06-30 ENCOUNTER — Ambulatory Visit: Admitting: Student

## 2023-06-30 ENCOUNTER — Encounter: Payer: Self-pay | Admitting: Student

## 2023-06-30 VITALS — BP 113/66 | HR 108 | Temp 98.1°F | Ht 62.0 in

## 2023-06-30 DIAGNOSIS — K529 Noninfective gastroenteritis and colitis, unspecified: Secondary | ICD-10-CM | POA: Diagnosis not present

## 2023-06-30 DIAGNOSIS — D649 Anemia, unspecified: Secondary | ICD-10-CM

## 2023-06-30 DIAGNOSIS — N179 Acute kidney failure, unspecified: Secondary | ICD-10-CM

## 2023-06-30 DIAGNOSIS — R1310 Dysphagia, unspecified: Secondary | ICD-10-CM

## 2023-06-30 LAB — RENAL FUNCTION PANEL
Albumin: 1.9 g/dL — ABNORMAL LOW (ref 3.5–5.0)
Anion gap: 13 (ref 5–15)
BUN: 10 mg/dL (ref 6–20)
CO2: 15 mmol/L — ABNORMAL LOW (ref 22–32)
Calcium: 8 mg/dL — ABNORMAL LOW (ref 8.9–10.3)
Chloride: 115 mmol/L — ABNORMAL HIGH (ref 98–111)
Creatinine, Ser: 0.94 mg/dL (ref 0.44–1.00)
GFR, Estimated: >60 mL/min (ref >60)
Glucose, Bld: 127 mg/dL — ABNORMAL HIGH (ref 70–99)
Phosphorus: 3 mg/dL (ref 2.5–4.6)
Potassium: 4 mmol/L (ref 3.5–5.1)
Sodium: 143 mmol/L (ref 135–145)

## 2023-06-30 LAB — CBC
HCT: 32.1 % — ABNORMAL LOW (ref 36.0–46.0)
Hemoglobin: 10.5 g/dL — ABNORMAL LOW (ref 12.0–15.0)
MCH: 31.2 pg (ref 26.0–34.0)
MCHC: 32.7 g/dL (ref 30.0–36.0)
MCV: 95.3 fL (ref 80.0–100.0)
Platelets: 362 10*3/uL (ref 150–400)
RBC: 3.37 MIL/uL — ABNORMAL LOW (ref 3.87–5.11)
RDW: 18.2 % — ABNORMAL HIGH (ref 11.5–15.5)
WBC: 5.5 10*3/uL (ref 4.0–10.5)
nRBC: 0 % (ref 0.0–0.2)

## 2023-06-30 NOTE — Addendum Note (Signed)
 Addended by: Marni Sins on: 06/30/2023 10:02 AM   Modules accepted: Orders

## 2023-06-30 NOTE — Telephone Encounter (Signed)
 This patient as arrived and is being seen.  Copied from CRM [id number]. Topic: Appointments - Appointment Scheduling >> Jun 30, 2023 10:10 AM Madelyne Schiff wrote: Patients Christina Byrd broke down, her son came and picked her up. R/s her appt. She should be there in 7 minutes.

## 2023-06-30 NOTE — Addendum Note (Signed)
 Addended by: Marni Sins on: 06/30/2023 10:05 AM   Modules accepted: Orders

## 2023-06-30 NOTE — Progress Notes (Signed)
 CC: Hospital follow-up.  HPI:  [name] is a 58 y.o. female living with a history stated below and presents today for hospital follow-up.  She was recently hospitalized between 04/21 - 04/30 for worsening dysphagia and hypocalcemia. She had a barium swallow which showed upper esophagus, poor peristaltic wave with incomplete esophageal emptying, small hiatal hernia, and evidence of spontaneous GERD with regurgitation. Hypocalcemia was thought to be secondary to decreased/poor p.o. intake.  Please see problem based assessment and plan for additional details.  Past Medical History:  Diagnosis Date   GASTROESOPHAGEAL REFLUX, NO ESOPHAGITIS 04/21/2006   Qualifier: Diagnosis of  By: WATT, JOANNE     GERD (gastroesophageal reflux disease) Dx 1995   Hot flash, menopausal 06/22/2016   Hot flashes 01/30/2020   HTN (hypertension)    Hypotension 10/21/2022   Patient's blood pressure at presentation was 87/65, follow up blood pressure was 81/58. Patient reports regurgitating everything she has taken po for the past month. Denies lightheadedness or dizziness. Discussed GI referral, though patient stated she would be going to the emergency room today for IV fluids.   -GI referral for upper endoscopy placed  -Patient planning on going to the ED for IV flu   Intertrigo 06/22/2016   Nausea & vomiting 12/23/2022   Nausea and vomiting 12/24/2022   Pain and swelling of left ankle 12/02/2014   Seizures (HCC)    Tendinitis of right hip flexor 07/23/2014   Tendonitis, Achilles, right 04/01/2014    Current Outpatient Medications on File Prior to Visit  Medication Sig Dispense Refill   allopurinol  (ZYLOPRIM ) 100 MG tablet Take 2 tablets (200 mg total) by mouth daily. 60 tablet 3   atorvastatin  (LIPITOR) 20 MG tablet Take 1 tablet (20 mg total) by mouth daily. 90 tablet 3   cetirizine  (ZYRTEC  ALLERGY) 10 MG tablet Take 1 tablet (10 mg total) by mouth daily. 30 tablet 2   colchicine  0.6 MG  tablet Take 1 tablet (0.6 mg total) by mouth daily. (Patient not taking: Reported on 01/24/2023) 30 tablet 3   DULoxetine  (CYMBALTA ) 30 MG capsule Take 1 capsule (30 mg total) by mouth daily. 30 capsule 2   ergocalciferol  (VITAMIN D2) 1.25 MG (50000 UT) capsule Take 1 capsule by mouth once a week. Every Wednesday starting 11/13 7 capsule 0   etodolac  (LODINE ) 400 MG tablet Take 1 tablet (400 mg total) by mouth 2 (two) times daily. 20 tablet 0   feeding supplement (ENSURE ENLIVE / ENSURE PLUS) LIQD Take 237 mLs by mouth 3 (three) times daily between meals. (Patient taking differently: Take 237 mLs by mouth daily.) 237 mL 12   fluticasone  (FLONASE ) 50 MCG/ACT nasal spray Use one spray in each nostril daily (Patient taking differently: Place 1 spray into both nostrils daily. Use one spray in each nostril daily) 9.9 mL 2   folic acid  (FOLVITE ) 1 MG tablet Take 1 tablet (1 mg total) by mouth daily. 30 tablet 2   gabapentin  (NEURONTIN ) 100 MG capsule Take 1 capsule (100 mg total) by mouth 3 (three) times daily. 90 capsule 2   hydrocortisone  cream 1 % Apply 1 Application topically 2 (two) times daily. (Patient taking differently: Apply 1 Application topically daily as needed for itching.) 30 g 0   levETIRAcetam  (KEPPRA ) 750 MG tablet Take 1 tablet (750 mg total) by mouth 2 (two) times daily. 60 tablet 2   Multiple Vitamin (MULTIVITAMIN WITH MINERALS) TABS tablet Take 1 tablet by mouth daily. 30 tablet 0   Nystatin (  GERHARDT'S BUTT CREAM) CREA Apply 1 Application topically 4 (four) times daily -  before meals and at bedtime. (Patient not taking: Reported on 01/24/2023) 1 each 0   ondansetron  (ZOFRAN ) 4 MG tablet Take 1 tablet (4 mg total) by mouth every 8 (eight) hours as needed for nausea or vomiting. 20 tablet 0   pantoprazole  (PROTONIX ) 40 MG tablet Take 1 tablet (40 mg total) by mouth 2 (two) times daily. 90 tablet 3   thiamine  (VITAMIN B1) 100 MG tablet Take 1 tablet (100 mg total) by mouth daily. 30 tablet  0   Zinc  Oxide (TRIPLE PASTE) 12.8 % ointment Apply topically as needed for irritation. In your buttocks 56.7 g 0   [DISCONTINUED] metFORMIN  (GLUCOPHAGE ) 500 MG tablet Take 0.5 tablets (250 mg total) by mouth daily with breakfast. 45 tablet 6   No current facility-administered medications on file prior to visit.   Review of Systems: ROS negative except for what is noted on the assessment and plan.  Vitals:   06/30/23 1037 06/30/23 1133  BP: 113/66   Pulse: (!) 117 (!) 108  Temp: 98.1 F (36.7 C)   TempSrc: Oral   SpO2: 100%   Height: 5\' 2"  (1.575 m)       Physical Exam: Constitutional: Sitting in wheelchair, NAD  Cardiovascular: Tachycardic rate, no murmurs or gallops.   Pulmonary/Chest: Clear lungs bilaterally.  Assessment & Plan:   Normocytic anemia Underwent EGD 4/29, that was unremarkable.  Duodenum was biopsied, given patient's chronic diarrhea and  evaluated for celiac. Also underwent colonoscopy 4/29 that was normal.  B.iopsies were taken for evaluation of microscopic colitis.  Final pathology results for both EGD and colonoscopy pending -Follow-up on final path result. - Will get a repeat CBC.  Dysphagia Improved, able to tolerate p.o. diet, without nausea or vomiting.  Unclear what instigated this patient chronic dysphagia, as barium swallow showed normal esophagus with a small hiatal hernia, and intermittent GERD with regurgitation.   I suspect chronic alcohol use as unlikely etiology and will benefit from alcohol cessation intervention.  Since discharge from the hospital, she is not drinking as much, had only 1 beer during her daughter's birthday party. I am unable to get weight on her today, secondary to his severe peripheral neuropathy. - Encourage p.o. intake -Could consider naltrexone at next visit.  Chronic diarrhea While hospitalized, she had normal gap metabolic acidosis, this was secondary to diarrhea.  Since she is no longer having diarrhea episodes, I  will repeat CMP and see if the acidosis has resolved.  - CMP ordered.   Patient discussed with Dr. Burnie Cartwright, MD [facility], PGY-1 Pager: [phone] Date 07/01/2023 Time 7:42 AM

## 2023-07-01 NOTE — Progress Notes (Signed)
 Internal Medicine Clinic Attending  Case discussed with the resident at the time of the visit.  We reviewed the resident's history and exam and pertinent patient test results.  I agree with the assessment, diagnosis, and plan of care documented in the resident's note.

## 2023-07-01 NOTE — Assessment & Plan Note (Signed)
 Improved, able to tolerate p.o. diet, without nausea or vomiting.  Unclear what instigated this patient chronic dysphagia, as barium swallow showed normal esophagus with a small hiatal hernia, and intermittent GERD with regurgitation.   I suspect chronic alcohol use as unlikely etiology and will benefit from alcohol cessation intervention.  Since discharge from the hospital, she is not drinking as much, had only 1 beer during her daughter's birthday party. I am unable to get weight on her today, secondary to his severe peripheral neuropathy. - Encourage p.o. intake -Could consider naltrexone at next visit.

## 2023-07-01 NOTE — Assessment & Plan Note (Signed)
 While hospitalized, she had normal gap metabolic acidosis, this was secondary to diarrhea.  Since she is no longer having diarrhea episodes, I will repeat CMP and see if the acidosis has resolved.  - CMP ordered.

## 2023-07-01 NOTE — Assessment & Plan Note (Signed)
 Underwent EGD 4/29, that was unremarkable.  Duodenum was biopsied, given patient's chronic diarrhea and  evaluated for celiac. Also underwent colonoscopy 4/29 that was normal.  B.iopsies were taken for evaluation of microscopic colitis.  Final pathology results for both EGD and colonoscopy pending -Follow-up on final path result. - Will get a repeat CBC.

## 2023-07-02 ENCOUNTER — Emergency Department (HOSPITAL_COMMUNITY): Admission: EM | Admit: 2023-07-02 | Discharge: 2023-07-02 | Disposition: A | Discharge status: Home / Self Care

## 2023-07-02 ENCOUNTER — Other Ambulatory Visit: Payer: Self-pay

## 2023-07-02 ENCOUNTER — Emergency Department (HOSPITAL_COMMUNITY)

## 2023-07-02 DIAGNOSIS — R519 Headache, unspecified: Secondary | ICD-10-CM | POA: Diagnosis not present

## 2023-07-02 DIAGNOSIS — M25552 Pain in left hip: Secondary | ICD-10-CM | POA: Diagnosis not present

## 2023-07-02 DIAGNOSIS — Y92009 Unspecified place in unspecified non-institutional (private) residence as the place of occurrence of the external cause: Secondary | ICD-10-CM | POA: Insufficient documentation

## 2023-07-02 DIAGNOSIS — M19012 Primary osteoarthritis, left shoulder: Secondary | ICD-10-CM | POA: Diagnosis not present

## 2023-07-02 DIAGNOSIS — W01198A Fall on same level from slipping, tripping and stumbling with subsequent striking against other object, initial encounter: Secondary | ICD-10-CM | POA: Diagnosis not present

## 2023-07-02 DIAGNOSIS — M25512 Pain in left shoulder: Secondary | ICD-10-CM | POA: Diagnosis not present

## 2023-07-02 DIAGNOSIS — S0990XA Unspecified injury of head, initial encounter: Secondary | ICD-10-CM | POA: Diagnosis not present

## 2023-07-02 DIAGNOSIS — I10 Essential (primary) hypertension: Secondary | ICD-10-CM | POA: Insufficient documentation

## 2023-07-02 DIAGNOSIS — M778 Other enthesopathies, not elsewhere classified: Secondary | ICD-10-CM | POA: Diagnosis not present

## 2023-07-02 DIAGNOSIS — W19XXXA Unspecified fall, initial encounter: Secondary | ICD-10-CM | POA: Diagnosis not present

## 2023-07-02 DIAGNOSIS — S199XXA Unspecified injury of neck, initial encounter: Secondary | ICD-10-CM | POA: Diagnosis not present

## 2023-07-02 NOTE — ED Notes (Signed)
 Patient discharged in wheelchair with son

## 2023-07-02 NOTE — ED Notes (Addendum)
 Patient assisted to the bedpan. Clean brief put on patient after.

## 2023-07-02 NOTE — Discharge Instructions (Signed)
 Your CT scans did not show any concerning findings.  X-rays are without any fracture.  Follow-up with your primary care doctor.  Return for any emergent symptoms.

## 2023-07-02 NOTE — ED Triage Notes (Signed)
 Patient bib EMS after a fall this morning at home. Denies LOC. Pt states she has pain In the back of the head and abdominal pain. PT admits to having alcohol this morning .  Alert and oriented x4 on arrival

## 2023-07-02 NOTE — ED Provider Notes (Signed)
 [place] [facility]PARTMENT AT [facility] Provider Note   CSN: [id number] Arrival date & time: 07/02/23  1157     History  Chief Complaint  Patient presents with   Christina Byrd    CLOMA FUNSTON is a 58 y.o. female.  58 year old female presents today for concern of a fall that occurred this morning.  She was ambulating in the house using a walker when she tripped and fell backwards.  She struck the back of her head against the corner of a door jam.  Denies any other injuries but states when she was being helped up she did notice some left shoulder pain.  Not on any blood thinners.  No other complaints.  The history is provided by the patient. No language interpreter was used.       Home Medications Prior to Admission medications   Medication Sig Start Date End Date Taking? Authorizing Provider  allopurinol  (ZYLOPRIM ) 100 MG tablet Take 2 tablets (200 mg total) by mouth daily. 04/29/23   Carleen Chary, DO  atorvastatin  (LIPITOR) 20 MG tablet Take 1 tablet (20 mg total) by mouth daily. 05/03/23   Carleen Chary, DO  cetirizine  (ZYRTEC  ALLERGY) 10 MG tablet Take 1 tablet (10 mg total) by mouth daily. 05/26/23 05/25/24  Carleen Chary, DO  colchicine  0.6 MG tablet Take 1 tablet (0.6 mg total) by mouth daily. Patient not taking: Reported on 01/24/2023 01/12/23   Atway, Rayann N, DO  DULoxetine  (CYMBALTA ) 30 MG capsule Take 1 capsule (30 mg total) by mouth daily. 05/26/23 05/25/24  Carleen Chary, DO  ergocalciferol  (VITAMIN D2) 1.25 MG (50000 UT) capsule Take 1 capsule by mouth once a week. Every Wednesday starting 11/13 04/29/23   Carleen Chary, DO  etodolac  (LODINE ) 400 MG tablet Take 1 tablet (400 mg total) by mouth 2 (two) times daily. 04/29/23   Carleen Chary, DO  feeding supplement (ENSURE ENLIVE / ENSURE PLUS) LIQD Take 237 mLs by mouth 3 (three) times daily between meals. Patient taking differently: Take 237 mLs by mouth daily. 12/30/22   Jackolyn Masker, MD  fluticasone  (FLONASE ) 50 MCG/ACT nasal spray Use one spray in each nostril daily Patient taking differently: Place 1 spray into both nostrils daily. Use one spray in each nostril daily 05/27/23   Carleen Chary, DO  folic acid  (FOLVITE ) 1 MG tablet Take 1 tablet (1 mg total) by mouth daily. 05/03/23   Carleen Chary, DO  gabapentin  (NEURONTIN ) 100 MG capsule Take 1 capsule (100 mg total) by mouth 3 (three) times daily. 06/06/23 06/05/24  Aurora Lees, DO  hydrocortisone  cream 1 % Apply 1 Application topically 2 (two) times daily. Patient taking differently: Apply 1 Application topically daily as needed for itching. 01/05/23   Masters, Katie, DO  levETIRAcetam  (KEPPRA ) 750 MG tablet Take 1 tablet (750 mg total) by mouth 2 (two) times daily. 05/03/23   Carleen Chary, DO  Multiple Vitamin (MULTIVITAMIN WITH MINERALS) TABS tablet Take 1 tablet by mouth daily. 05/03/23   Carleen Chary, DO  Nystatin (GERHARDT'S BUTT CREAM) CREA Apply 1 Application topically 4 (four) times daily -  before meals and at bedtime. Patient not taking: Reported on 01/24/2023 12/30/22   Jackolyn Masker, MD  ondansetron  (ZOFRAN ) 4 MG tablet Take 1 tablet (4 mg total) by mouth every 8 (eight) hours as needed for nausea or vomiting. 05/03/23   Carleen Chary, DO  pantoprazole  (PROTONIX ) 40 MG tablet Take 1 tablet (40 mg total) by mouth 2 (two) times daily. 05/03/23   Juberg,  Christopher, DO  thiamine  (VITAMIN B1) 100 MG tablet Take 1 tablet (100 mg total) by mouth daily. 05/03/23   Carleen Chary, DO  Zinc  Oxide (TRIPLE PASTE) 12.8 % ointment Apply topically as needed for irritation. In your buttocks 12/30/22   Jackolyn Masker, MD  metFORMIN  (GLUCOPHAGE ) 500 MG tablet Take 0.5 tablets (250 mg total) by mouth daily with breakfast. 03/05/20 04/02/20  Hassie Lint, PA-C      Allergies    Losartan  potassium, Levaquin  [levofloxacin  in d5w], Ace inhibitors, Codeine , Cefepime , Chlorhexidine, and Vancomycin      Review of Systems   Review of Systems  Constitutional:  Negative for chills and fever.  Eyes:  Negative for visual disturbance.  Respiratory:  Negative for shortness of breath.   Musculoskeletal:  Positive for arthralgias.  Neurological:  Positive for headaches. Negative for syncope.  All other systems reviewed and are negative.   Physical Exam Updated Vital Signs BP (!) 133/96   Pulse (!) 109   Temp 98.3 F (36.8 C) (Oral)   Resp 19   LMP 08/23/2011   SpO2 100%  Physical Exam Vitals and nursing note reviewed.  Constitutional:      General: She is not in acute distress.    Appearance: Normal appearance. She is not ill-appearing.  HENT:     Head: Normocephalic and atraumatic.     Nose: Nose normal.  Eyes:     Conjunctiva/sclera: Conjunctivae normal.  Cardiovascular:     Rate and Rhythm: Normal rate and regular rhythm.  Pulmonary:     Effort: Pulmonary effort is normal. No respiratory distress.  Musculoskeletal:        General: No deformity. Normal range of motion.     Cervical back: Normal range of motion.     Comments: Good range of motion in all major joints of upper and lower extremities.  Some tenderness to palpation over the left shoulder.  C-collar in place.  Thoracic and lumbar spine without tenderness palpation.  Spine well aligned.  Skin:    Findings: No rash.  Neurological:     Mental Status: She is alert.     ED Results / Procedures / Treatments   Labs (all labs ordered are listed, but only abnormal results are displayed) Labs Reviewed - No data to display  EKG None  Radiology No results found.  Procedures Procedures    Medications Ordered in ED Medications - No data to display  ED Course/ Medical Decision Making/ A&P                                 Medical Decision Making Amount and/or Complexity of Data Reviewed Radiology: ordered.    Medical Decision Making / ED Course   This patient presents to the ED for concern of fall,  this involves an extensive number of treatment options, and is a complaint that carries with it a high risk of complications and morbidity.  The differential diagnosis includes acute intracranial injury, fracture, contusion  MDM: 58 year old female presents today for concern of fall.  No loss of consciousness.  Not on anticoagulation.  Reports head injury.  Will obtain CT head and C-spine.  Will add on left shoulder x-ray.  Reports compliance with her medications.  No other complaints.  She lives at home with her husband.  CT head and neck without acute finding.  Left shoulder x-ray without acute concern. Discharged in stable condition.  Return precaution discussed.  Patient voices understanding and is in agreement with.   Additional history obtained: -Additional history obtained from PCP note to confirm patient is not on anticoagulation -External records from outside source obtained and reviewed including: Chart review including previous notes, labs, imaging, consultation notes   Lab Tests: -I ordered, reviewed, and interpreted labs.   The pertinent results include:   Labs Reviewed - No data to display    EKG  EKG Interpretation Date/Time:    Ventricular Rate:    PR Interval:    QRS Duration:    QT Interval:    QTC Calculation:   R Axis:      Text Interpretation:           Imaging Studies ordered: I ordered imaging studies including CT head, CT C-spine, left shoulder x-ray I independently visualized and interpreted imaging. I agree with the radiologist interpretation   Medicines ordered and prescription drug management: No orders of the defined types were placed in this encounter.   -I have reviewed the patients home medicines and have made adjustments as needed  Reevaluation: After the interventions noted above, I reevaluated the patient and found that they have :improved  Co morbidities that complicate the patient evaluation  Past Medical History:   Diagnosis Date   GASTROESOPHAGEAL REFLUX, NO ESOPHAGITIS 04/21/2006   Qualifier: Diagnosis of  By: WATT, JOANNE     GERD (gastroesophageal reflux disease) Dx 1995   Hot flash, menopausal 06/22/2016   Hot flashes 01/30/2020   HTN (hypertension)    Hypotension 10/21/2022   Patient's blood pressure at presentation was 87/65, follow up blood pressure was 81/58. Patient reports regurgitating everything she has taken po for the past month. Denies lightheadedness or dizziness. Discussed GI referral, though patient stated she would be going to the emergency room today for IV fluids.   -GI referral for upper endoscopy placed  -Patient planning on going to the ED for IV flu   Intertrigo 06/22/2016   Nausea & vomiting 12/23/2022   Nausea and vomiting 12/24/2022   Pain and swelling of left ankle 12/02/2014   Seizures (HCC)    Tendinitis of right hip flexor 07/23/2014   Tendonitis, Achilles, right 04/01/2014      Dispostion: Discharged in stable condition.  Return precaution discussed.  Final Clinical Impression(s) / ED Diagnoses Final diagnoses:  Fall, initial encounter    Rx / DC Orders ED Discharge Orders     None         Lucina Sabal, PA-C 07/02/23 1429    Carin Charleston, MD 07/02/23 2045

## 2023-07-02 NOTE — ED Notes (Signed)
 Patient transported to CT

## 2023-07-03 ENCOUNTER — Encounter (HOSPITAL_COMMUNITY): Payer: Self-pay

## 2023-07-03 ENCOUNTER — Other Ambulatory Visit: Payer: Self-pay

## 2023-07-03 NOTE — ED Triage Notes (Signed)
 Pt BIB PTA from home for a fall.  Pr c/o ABD back and bilat him pain.

## 2023-07-04 ENCOUNTER — Inpatient Hospital Stay: Payer: Self-pay | Admitting: Student

## 2023-07-04 ENCOUNTER — Other Ambulatory Visit: Payer: Self-pay

## 2023-07-04 ENCOUNTER — Inpatient Hospital Stay (HOSPITAL_COMMUNITY)
Admission: EM | Admit: 2023-07-04 | Discharge: 2023-07-08 | DRG: 641 | Disposition: A | Discharge status: Home Health | Attending: Infectious Diseases | Admitting: Infectious Diseases

## 2023-07-04 ENCOUNTER — Telehealth: Payer: Self-pay | Admitting: *Deleted

## 2023-07-04 ENCOUNTER — Telehealth: Payer: Self-pay | Admitting: Student

## 2023-07-04 ENCOUNTER — Emergency Department (HOSPITAL_COMMUNITY)

## 2023-07-04 ENCOUNTER — Emergency Department (HOSPITAL_COMMUNITY)
Admission: EM | Admit: 2023-07-04 | Discharge: 2023-07-04 | Discharge status: Against Medical Advice | Attending: Emergency Medicine | Admitting: Emergency Medicine

## 2023-07-04 DIAGNOSIS — R531 Weakness: Secondary | ICD-10-CM

## 2023-07-04 DIAGNOSIS — R109 Unspecified abdominal pain: Secondary | ICD-10-CM | POA: Insufficient documentation

## 2023-07-04 DIAGNOSIS — Z8249 Family history of ischemic heart disease and other diseases of the circulatory system: Secondary | ICD-10-CM

## 2023-07-04 DIAGNOSIS — G9340 Encephalopathy, unspecified: Secondary | ICD-10-CM | POA: Diagnosis present

## 2023-07-04 DIAGNOSIS — G621 Alcoholic polyneuropathy: Secondary | ICD-10-CM | POA: Diagnosis present

## 2023-07-04 DIAGNOSIS — Z885 Allergy status to narcotic agent status: Secondary | ICD-10-CM

## 2023-07-04 DIAGNOSIS — R1084 Generalized abdominal pain: Secondary | ICD-10-CM | POA: Diagnosis not present

## 2023-07-04 DIAGNOSIS — N2589 Other disorders resulting from impaired renal tubular function: Secondary | ICD-10-CM | POA: Diagnosis present

## 2023-07-04 DIAGNOSIS — K859 Acute pancreatitis without necrosis or infection, unspecified: Secondary | ICD-10-CM | POA: Diagnosis not present

## 2023-07-04 DIAGNOSIS — Z888 Allergy status to other drugs, medicaments and biological substances status: Secondary | ICD-10-CM

## 2023-07-04 DIAGNOSIS — Z881 Allergy status to other antibiotic agents status: Secondary | ICD-10-CM

## 2023-07-04 DIAGNOSIS — E860 Dehydration: Secondary | ICD-10-CM | POA: Diagnosis present

## 2023-07-04 DIAGNOSIS — R748 Abnormal levels of other serum enzymes: Secondary | ICD-10-CM | POA: Insufficient documentation

## 2023-07-04 DIAGNOSIS — K760 Fatty (change of) liver, not elsewhere classified: Secondary | ICD-10-CM | POA: Diagnosis not present

## 2023-07-04 DIAGNOSIS — Z6827 Body mass index (BMI) 27.0-27.9, adult: Secondary | ICD-10-CM

## 2023-07-04 DIAGNOSIS — K7011 Alcoholic hepatitis with ascites: Secondary | ICD-10-CM | POA: Diagnosis present

## 2023-07-04 DIAGNOSIS — M109 Gout, unspecified: Secondary | ICD-10-CM | POA: Diagnosis present

## 2023-07-04 DIAGNOSIS — E440 Moderate protein-calorie malnutrition: Secondary | ICD-10-CM | POA: Diagnosis present

## 2023-07-04 DIAGNOSIS — Z8673 Personal history of transient ischemic attack (TIA), and cerebral infarction without residual deficits: Secondary | ICD-10-CM

## 2023-07-04 DIAGNOSIS — K529 Noninfective gastroenteritis and colitis, unspecified: Secondary | ICD-10-CM | POA: Diagnosis present

## 2023-07-04 DIAGNOSIS — G4700 Insomnia, unspecified: Secondary | ICD-10-CM | POA: Diagnosis present

## 2023-07-04 DIAGNOSIS — D649 Anemia, unspecified: Secondary | ICD-10-CM | POA: Diagnosis present

## 2023-07-04 DIAGNOSIS — R1310 Dysphagia, unspecified: Secondary | ICD-10-CM

## 2023-07-04 DIAGNOSIS — E512 Wernicke's encephalopathy: Principal | ICD-10-CM | POA: Diagnosis present

## 2023-07-04 DIAGNOSIS — W19XXXA Unspecified fall, initial encounter: Secondary | ICD-10-CM | POA: Insufficient documentation

## 2023-07-04 DIAGNOSIS — K449 Diaphragmatic hernia without obstruction or gangrene: Secondary | ICD-10-CM | POA: Diagnosis present

## 2023-07-04 DIAGNOSIS — R609 Edema, unspecified: Secondary | ICD-10-CM | POA: Diagnosis not present

## 2023-07-04 DIAGNOSIS — Z79899 Other long term (current) drug therapy: Secondary | ICD-10-CM

## 2023-07-04 DIAGNOSIS — E46 Unspecified protein-calorie malnutrition: Secondary | ICD-10-CM | POA: Diagnosis present

## 2023-07-04 DIAGNOSIS — R000 Tachycardia, unspecified: Secondary | ICD-10-CM | POA: Diagnosis not present

## 2023-07-04 DIAGNOSIS — D61818 Other pancytopenia: Secondary | ICD-10-CM | POA: Diagnosis present

## 2023-07-04 DIAGNOSIS — E8809 Other disorders of plasma-protein metabolism, not elsewhere classified: Secondary | ICD-10-CM | POA: Diagnosis present

## 2023-07-04 DIAGNOSIS — R946 Abnormal results of thyroid function studies: Secondary | ICD-10-CM | POA: Diagnosis present

## 2023-07-04 DIAGNOSIS — Z5321 Procedure and treatment not carried out due to patient leaving prior to being seen by health care provider: Secondary | ICD-10-CM | POA: Insufficient documentation

## 2023-07-04 DIAGNOSIS — R270 Ataxia, unspecified: Secondary | ICD-10-CM | POA: Diagnosis present

## 2023-07-04 DIAGNOSIS — Z87891 Personal history of nicotine dependence: Secondary | ICD-10-CM

## 2023-07-04 DIAGNOSIS — Z1152 Encounter for screening for COVID-19: Secondary | ICD-10-CM

## 2023-07-04 DIAGNOSIS — M549 Dorsalgia, unspecified: Secondary | ICD-10-CM | POA: Insufficient documentation

## 2023-07-04 DIAGNOSIS — K219 Gastro-esophageal reflux disease without esophagitis: Secondary | ICD-10-CM | POA: Diagnosis present

## 2023-07-04 DIAGNOSIS — R509 Fever, unspecified: Secondary | ICD-10-CM | POA: Diagnosis not present

## 2023-07-04 DIAGNOSIS — G40909 Epilepsy, unspecified, not intractable, without status epilepticus: Secondary | ICD-10-CM

## 2023-07-04 DIAGNOSIS — R9431 Abnormal electrocardiogram [ECG] [EKG]: Secondary | ICD-10-CM | POA: Diagnosis present

## 2023-07-04 DIAGNOSIS — R5381 Other malaise: Secondary | ICD-10-CM

## 2023-07-04 DIAGNOSIS — E43 Unspecified severe protein-calorie malnutrition: Secondary | ICD-10-CM | POA: Insufficient documentation

## 2023-07-04 DIAGNOSIS — E872 Acidosis, unspecified: Secondary | ICD-10-CM | POA: Insufficient documentation

## 2023-07-04 DIAGNOSIS — I5032 Chronic diastolic (congestive) heart failure: Secondary | ICD-10-CM | POA: Diagnosis present

## 2023-07-04 DIAGNOSIS — F0150 Vascular dementia without behavioral disturbance: Secondary | ICD-10-CM | POA: Diagnosis present

## 2023-07-04 DIAGNOSIS — F1010 Alcohol abuse, uncomplicated: Secondary | ICD-10-CM | POA: Diagnosis present

## 2023-07-04 DIAGNOSIS — D638 Anemia in other chronic diseases classified elsewhere: Secondary | ICD-10-CM | POA: Diagnosis present

## 2023-07-04 DIAGNOSIS — R296 Repeated falls: Secondary | ICD-10-CM

## 2023-07-04 DIAGNOSIS — K838 Other specified diseases of biliary tract: Secondary | ICD-10-CM | POA: Diagnosis present

## 2023-07-04 LAB — COMPREHENSIVE METABOLIC PANEL WITH GFR
ALT: 65 U/L — ABNORMAL HIGH (ref 0–44)
AST: 123 U/L — ABNORMAL HIGH (ref 15–41)
Albumin: 2.1 g/dL — ABNORMAL LOW (ref 3.5–5.0)
Alkaline Phosphatase: 220 U/L — ABNORMAL HIGH (ref 38–126)
Anion gap: 13 (ref 5–15)
BUN: 9 mg/dL (ref 6–20)
CO2: 14 mmol/L — ABNORMAL LOW (ref 22–32)
Calcium: 7.5 mg/dL — ABNORMAL LOW (ref 8.9–10.3)
Chloride: 116 mmol/L — ABNORMAL HIGH (ref 98–111)
Creatinine, Ser: 0.97 mg/dL (ref 0.44–1.00)
GFR, Estimated: >60 mL/min (ref >60)
Glucose, Bld: 103 mg/dL — ABNORMAL HIGH (ref 70–99)
Potassium: 4.3 mmol/L (ref 3.5–5.1)
Sodium: 143 mmol/L (ref 135–145)
Total Bilirubin: 0.6 mg/dL (ref 0.0–1.2)
Total Protein: 5.6 g/dL — ABNORMAL LOW (ref 6.5–8.1)

## 2023-07-04 LAB — CBC
HCT: 27.5 % — ABNORMAL LOW (ref 36.0–46.0)
Hemoglobin: 8.9 g/dL — ABNORMAL LOW (ref 12.0–15.0)
MCH: 31.8 pg (ref 26.0–34.0)
MCHC: 32.4 g/dL (ref 30.0–36.0)
MCV: 98.2 fL (ref 80.0–100.0)
Platelets: 300 10*3/uL (ref 150–400)
RBC: 2.8 MIL/uL — ABNORMAL LOW (ref 3.87–5.11)
RDW: 17.5 % — ABNORMAL HIGH (ref 11.5–15.5)
WBC: 9 10*3/uL (ref 4.0–10.5)
nRBC: 0 % (ref 0.0–0.2)

## 2023-07-04 LAB — BRAIN NATRIURETIC PEPTIDE: B Natriuretic Peptide: 91.8 pg/mL (ref 0.0–100.0)

## 2023-07-04 LAB — I-STAT VENOUS BLOOD GAS, ED
Acid-base deficit: 8 mmol/L — ABNORMAL HIGH (ref 0.0–2.0)
Bicarbonate: 15.1 mmol/L — ABNORMAL LOW (ref 20.0–28.0)
Calcium, Ion: 1.02 mmol/L — ABNORMAL LOW (ref 1.15–1.40)
HCT: 25 % — ABNORMAL LOW (ref 36.0–46.0)
Hemoglobin: 8.5 g/dL — ABNORMAL LOW (ref 12.0–15.0)
O2 Saturation: 83 %
Potassium: 4.1 mmol/L (ref 3.5–5.1)
Sodium: 144 mmol/L (ref 135–145)
TCO2: 16 mmol/L — ABNORMAL LOW (ref 22–32)
pCO2, Ven: 23.2 mmHg — ABNORMAL LOW (ref 44–60)
pH, Ven: 7.421 (ref 7.25–7.43)
pO2, Ven: 45 mmHg (ref 32–45)

## 2023-07-04 LAB — CK: Total CK: 41 U/L (ref 38–234)

## 2023-07-04 LAB — LIPASE, BLOOD: Lipase: 53 U/L — ABNORMAL HIGH (ref 11–51)

## 2023-07-04 LAB — RESP PANEL BY RT-PCR (RSV, FLU A&B, COVID)  RVPGX2
Influenza A by PCR: NEGATIVE
Influenza B by PCR: NEGATIVE
Resp Syncytial Virus by PCR: NEGATIVE
SARS Coronavirus 2 by RT PCR: NEGATIVE

## 2023-07-04 LAB — MAGNESIUM: Magnesium: 1 mg/dL — ABNORMAL LOW (ref 1.7–2.4)

## 2023-07-04 MED ORDER — LORAZEPAM 2 MG/ML IJ SOLN
1.0000 mg | INTRAMUSCULAR | Status: DC | PRN
  Administered 2023-07-05: 1 mg via INTRAVENOUS
  Filled 2023-07-04: qty 1

## 2023-07-04 MED ORDER — FOLIC ACID 1 MG PO TABS
1.0000 mg | ORAL_TABLET | Freq: Every day | ORAL | Status: DC
  Administered 2023-07-05: 1 mg via ORAL
  Filled 2023-07-04 (×3): qty 1

## 2023-07-04 MED ORDER — RIVAROXABAN 10 MG PO TABS
10.0000 mg | ORAL_TABLET | Freq: Every day | ORAL | Status: DC
  Administered 2023-07-04 – 2023-07-05 (×2): 10 mg via ORAL
  Filled 2023-07-04 (×5): qty 1

## 2023-07-04 MED ORDER — ALLOPURINOL 100 MG PO TABS
200.0000 mg | ORAL_TABLET | Freq: Every day | ORAL | Status: DC
  Administered 2023-07-05 – 2023-07-08 (×3): 200 mg via ORAL
  Filled 2023-07-04 (×4): qty 2

## 2023-07-04 MED ORDER — SODIUM CHLORIDE 0.9 % IV BOLUS
1000.0000 mL | Freq: Once | INTRAVENOUS | Status: AC
  Administered 2023-07-04: 1000 mL via INTRAVENOUS

## 2023-07-04 MED ORDER — LACTATED RINGERS IV BOLUS: 1000.0000 mL | Freq: Once | INTRAVENOUS | Status: DC

## 2023-07-04 MED ORDER — PANTOPRAZOLE SODIUM 40 MG PO TBEC
40.0000 mg | DELAYED_RELEASE_TABLET | Freq: Two times a day (BID) | ORAL | Status: DC
  Administered 2023-07-05: 40 mg via ORAL
  Filled 2023-07-04 (×2): qty 1

## 2023-07-04 MED ORDER — MAGNESIUM SULFATE 4 GM/100ML IV SOLN
4.0000 g | Freq: Once | INTRAVENOUS | Status: AC
  Administered 2023-07-04: 4 g via INTRAVENOUS
  Filled 2023-07-04: qty 100

## 2023-07-04 MED ORDER — IOHEXOL 350 MG/ML SOLN
75.0000 mL | Freq: Once | INTRAVENOUS | Status: AC | PRN
  Administered 2023-07-04: 75 mL via INTRAVENOUS

## 2023-07-04 MED ORDER — LEVETIRACETAM (KEPPRA) 500 MG/5 ML ADULT IV PUSH
750.0000 mg | Freq: Two times a day (BID) | INTRAVENOUS | Status: DC
  Administered 2023-07-04 – 2023-07-05 (×2): 750 mg via INTRAVENOUS
  Filled 2023-07-04: qty 10
  Filled 2023-07-04 (×2): qty 7.5

## 2023-07-04 MED ORDER — ATORVASTATIN CALCIUM 10 MG PO TABS
20.0000 mg | ORAL_TABLET | Freq: Every day | ORAL | Status: DC
  Administered 2023-07-05 – 2023-07-08 (×3): 20 mg via ORAL
  Filled 2023-07-04 (×4): qty 2

## 2023-07-04 MED ORDER — THIAMINE HCL 100 MG/ML IJ SOLN
500.0000 mg | Freq: Three times a day (TID) | INTRAVENOUS | Status: DC
  Administered 2023-07-04 – 2023-07-08 (×8): 500 mg via INTRAVENOUS
  Filled 2023-07-04 (×12): qty 5

## 2023-07-04 MED ORDER — ADULT MULTIVITAMIN W/MINERALS CH
1.0000 | ORAL_TABLET | Freq: Every day | ORAL | Status: DC
  Administered 2023-07-05: 1 via ORAL
  Filled 2023-07-04 (×3): qty 1

## 2023-07-04 MED ORDER — LORAZEPAM 1 MG PO TABS: 1.0000 mg | ORAL_TABLET | ORAL | Status: DC | PRN

## 2023-07-04 NOTE — Telephone Encounter (Signed)
 Patient's significant other here requesting note for patient to miss Court today.  Spoke with Dr. Machin unable to do note for patient as she was in the ER earlier today and left before being seen.  Significant other was informed of.

## 2023-07-04 NOTE — Telephone Encounter (Signed)
 Copied from CRM [id number]. Topic: General - Other >> Jul 04, 2023  2:25 PM Christina Byrd wrote: Reason for CRM: Patient called in stating that she wanted to let her doctor know that she's currently in the hospital. She is requesting for her provider to give her a call, if possible. CB #: [id number].

## 2023-07-04 NOTE — Progress Notes (Signed)
 Hb improved from 9.4 >>10.5.

## 2023-07-04 NOTE — ED Provider Notes (Signed)
 [place] EMERGENCY DEPARTMENT AT [facility] Provider Note  History  Chief Complaint:  Abdominal Pain, Diarrhea, and Tachycardia   Abdominal Pain Associated symptoms: diarrhea   Diarrhea Associated symptoms: abdominal pain      Christina Byrd is a 58 y.o. female with a history of hypertension, GERD, seizures who presents emergency department for abdominal pain and diarrhea.  She also reports that she has had lower extremity swelling.  She states that her feet also hurt.  She reports that the foot pain and swelling have been going on for 6 months.  She denies any chest pain or shortness of breath.  She then discusses that she is had some feelings that her heart was racing.  She states that she noticed that today along with the abdominal pain and diarrhea which prompted her to come to the emergency department.  She denies any fevers or chills.  She reports that she stopped drinking 2 weeks ago.  No withdrawal symptoms. When patient's family arrived they state that the patient has continued to drink alcohol.  Past Medical History:  Diagnosis Date   GASTROESOPHAGEAL REFLUX, NO ESOPHAGITIS 04/21/2006   Qualifier: Diagnosis of  By: WATT, JOANNE     GERD (gastroesophageal reflux disease) Dx 1995   Hot flash, menopausal 06/22/2016   Hot flashes 01/30/2020   HTN (hypertension)    Hypotension 10/21/2022   Patient's blood pressure at presentation was 87/65, follow up blood pressure was 81/58. Patient reports regurgitating everything she has taken po for the past month. Denies lightheadedness or dizziness. Discussed GI referral, though patient stated she would be going to the emergency room today for IV fluids.   -GI referral for upper endoscopy placed  -Patient planning on going to the ED for IV flu   Intertrigo 06/22/2016   Nausea & vomiting 12/23/2022   Nausea and vomiting 12/24/2022   Pain and swelling of left ankle 12/02/2014   Seizures (HCC)    Tendinitis of right hip flexor  07/23/2014   Tendonitis, Achilles, right 04/01/2014    Past Surgical History:  Procedure Laterality Date   BIOPSY  06/03/2022   Procedure: BIOPSY;  Surgeon: Elois Hair, MD;  Location: [facility] ENDOSCOPY;  Service: Gastroenterology;;   COLONOSCOPY N/A 06/21/2023   Procedure: COLONOSCOPY;  Surgeon: Nannette Babe, MD;  Location: [facility] ENDOSCOPY;  Service: Gastroenterology;  Laterality: N/A;   ESOPHAGOGASTRODUODENOSCOPY N/A 12/29/2022   Procedure: ESOPHAGOGASTRODUODENOSCOPY (EGD);  Surgeon: Nandigam, Kavitha V, MD;  [facility];  Service: Gastroenterology;  Laterality: N/A;   ESOPHAGOGASTRODUODENOSCOPY N/A 06/21/2023   Procedure: EGD (ESOPHAGOGASTRODUODENOSCOPY);  Surgeon: Nannette Babe, MD;  Location: [facility] ENDOSCOPY;  Service: Gastroenterology;  Laterality: N/A;   ESOPHAGOGASTRODUODENOSCOPY (EGD) WITH PROPOFOL  N/A 06/03/2022   Procedure: ESOPHAGOGASTRODUODENOSCOPY (EGD) WITH PROPOFOL ;  Surgeon: Elois Hair, MD;  Location: [facility] ENDOSCOPY;  Service: Gastroenterology;  Laterality: N/A;   HOT HEMOSTASIS N/A 06/03/2022   Procedure: HOT HEMOSTASIS (ARGON PLASMA COAGULATION/BICAP);  Surgeon: Elois Hair, MD;  Location: [facility] ENDOSCOPY;  Service: Gastroenterology;  Laterality: N/A;    Family History  Problem Relation Age of Onset   CAD Mother     Social History   Tobacco Use   Smoking status: Former    Current packs/day: 0.00    Types: Cigarettes    Quit date: 06/25/2012    Years since quitting: 11.0   Smokeless tobacco: Never  Vaping Use   Vaping status: Never Used  Substance Use Topics   Alcohol use: Not Currently  Alcohol/week: 49.0 standard drinks of alcohol    Types: 14 Cans of beer, 35 Shots of liquor per week   Drug use: No    Review of Systems  Review of Systems  Gastrointestinal:  Positive for abdominal pain and diarrhea.     Reviewed and documented in HPI if pertinent.   Physical Exam   ED Triage Vitals  Encounter Vitals Group     BP 07/04/23 1425  (!) 142/122     Systolic BP Percentile --      Diastolic BP Percentile --      Pulse Rate 07/04/23 1425 (!) 124     Resp 07/04/23 1425 18     Temp 07/04/23 1425 99.2 F (37.3 C)     Temp src --      SpO2 07/04/23 1425 100 %     Weight 07/04/23 1425 150 lb (68 kg)     Height 07/04/23 1425 5\' 2"  (1.575 m)     Head Circumference --      Peak Flow --      Pain Score 07/04/23 1424 10     Pain Loc --      Pain Education --      Exclude from Growth Chart --      Physical Exam Vitals and nursing note reviewed.  Constitutional:      General: She is not in acute distress.    Appearance: She is well-developed.  HENT:     Head: Normocephalic and atraumatic.  Eyes:     Conjunctiva/sclera: Conjunctivae normal.  Cardiovascular:     Rate and Rhythm: Normal rate and regular rhythm.     Pulses:          Dorsalis pedis pulses are 2+ on the right side and 2+ on the left side.     Heart sounds: No murmur heard. Pulmonary:     Effort: Pulmonary effort is normal. No respiratory distress.     Breath sounds: Normal breath sounds.  Abdominal:     Palpations: Abdomen is soft.     Tenderness: There is generalized abdominal tenderness (mild). There is no guarding or rebound.  Musculoskeletal:        General: No swelling.     Cervical back: Neck supple.     Right lower leg: 1+ Edema present.     Left lower leg: 1+ Edema present.  Skin:    General: Skin is warm and dry.     Capillary Refill: Capillary refill takes less than 2 seconds.  Neurological:     Mental Status: She is alert.  Psychiatric:        Mood and Affect: Mood normal.      Procedures   Procedures  ED Course - Medical Decision Making  Brief Overview [name] is a 58 y.o. female who presents as per above.  I have reviewed the nursing documentation for past medical history, family history, and social history and agree.  I have reviewed the patient's vital signs. Tachycardia, mild hypertension.  Initial  Differential Diagnoses: I am primarily concerned for pancreatitis, colitis, electrolyte abnormalities, tachycardia, arrhythmia.  Therapies: These medications and interventions were provided for the patient while in the ED.  Medications  rivaroxaban  (XARELTO ) tablet 10 mg (10 mg Oral Given 07/04/23 2227)  thiamine  (VITAMIN B1) 500 mg in sodium chloride  0.9 % 50 mL IVPB (500 mg Intravenous New Bag/Given 07/04/23 2232)  multivitamin with minerals tablet 1 tablet (1 tablet Oral Patient Refused/Not Given 07/04/23 2213)  folic  acid (FOLVITE ) tablet 1 mg (1 mg Oral Patient Refused/Not Given 07/04/23 2213)  levETIRAcetam  (KEPPRA ) undiluted injection 750 mg (750 mg Intravenous Given 07/04/23 2217)  LORazepam  (ATIVAN ) tablet 1-4 mg (has no administration in time range)    Or  LORazepam  (ATIVAN ) injection 1-4 mg (has no administration in time range)  sodium chloride  0.9 % bolus 1,000 mL (0 mLs Intravenous Stopped 07/04/23 1919)  iohexol  (OMNIPAQUE ) 350 MG/ML injection 75 mL (75 mLs Intravenous Contrast Given 07/04/23 1804)  magnesium  sulfate IVPB 4 g 100 mL (0 g Intravenous Stopped 07/04/23 2155)    Testing Results: On my interpretation labs are significant for : Elevated lipase No leukocytosis Hemoglobin 8.9 Acidosis  On my interpretation imaging is significant for: CT abdomen pelvis concerning for acute pancreatitis, colitis  I interpreted the ECG. It reveals an atrial fibrillation rhythm. The QTc, and QRS are appropriate. There are no signs of acute ischemia or of significant electrical abnormalities. The ECG does not show a STEMI. There are no ST depressions. There are no T wave inversions. There is no evidence of a High-Grade Conduction Block, WPW, Brugada Sign, ARVC, DeWinters T Waves, or Wellens Waves.  See the EMR for full details regarding lab and imaging results.   Medical Decision Making 58 year old female who presents the emergency department for generalized abdominal pain.  She also  reports diarrhea.  She then perseverates on her lower extremity edema and pain.  On exam patient points to the epigastric region when she describes her pain however on palpation has generalized tenderness.  There is no evidence of peritonitis.  She is afebrile.  Patient is persistently tachycardic here in the emergency department.  It appears to be atrial fibrillation, not currently on blood thinners.  CT is concerning for pancreatitis.  Laboratory studies do reveal an elevated lipase.  Patient also has acidosis.  Right upper quadrant ultrasound was also performed which demonstrates CBD dilation.   I then discussed with the family regarding patient.  They report the patient continues to use alcohol.  This is likely cause of pancreatitis.  Patient did have an elevated CBD however that can be seen in pancreatitis.  I do feel the patient requires admission given her previous hospitalizations as well as abdominal exam.  I discussed with the hospital medicine team and they admit the patient to their service.  Problems Addressed: Acute pancreatitis, unspecified complication status, unspecified pancreatitis type: acute illness or injury that poses a threat to life or bodily functions  Amount and/or Complexity of Data Reviewed Labs: ordered. Radiology: ordered.  Risk Prescription drug management. Decision regarding hospitalization.     ### All radiography studies, electrocardiograms, and laboratory data were personally reviewed by me and incorporated into my medical decision making. Impression   1. Acute pancreatitis, unspecified complication status, unspecified pancreatitis type      Note: Dragon medical dictation software was used in the creation of this note.     Arminda Landmark, MD 07/04/23 1478    Deatra Face, MD 07/05/23 2352

## 2023-07-04 NOTE — TOC CM/SW Note (Signed)
 SW attached substance abuse resources to patient's AVS.   .Lily Peer, MSW, LCSWA Transition of Care  Clinical Social Worker (ED 3-11 Mon-Fri)  [phone]

## 2023-07-04 NOTE — ED Triage Notes (Signed)
 PT bib ems from home for complaints of abd pain, diarrhea and tachycardia started after eating cereal this morning. .   VS  130/86 127 pulse 99% 16RR

## 2023-07-04 NOTE — ED Provider Triage Note (Signed)
 Emergency Medicine Provider Triage Evaluation Note  Christina Byrd , a 58 y.o. female  was evaluated in triage.  Pt complains of lower extremity edema, abdominal pain, diarrhea and tachycardia.  Review of Systems  Positive:  Negative:   Physical Exam  BP (!) 142/122 (BP Location: Left Leg)   Pulse (!) 124   Temp 99.2 F (37.3 C)   Resp 18   Ht 5\' 2"  (1.575 m)   Wt 68 kg   LMP 08/23/2011   SpO2 100%   BMI 27.44 kg/m  Gen:   Awake, no distress   Resp:  Normal effort  MSK:   Moves extremities without difficulty, bilateral lower extremity edema Other:  Suprapubic and left lower quadrant abdominal tenderness  Medical Decision Making  Medically screening exam initiated at 3:07 PM.  Appropriate orders placed.  Christina Byrd was informed that the remainder of the evaluation will be completed by another provider, this initial triage assessment does not replace that evaluation, and the importance of remaining in the ED until their evaluation is complete.  Basic labs, EKG and CT abdomen pelvis, fluids   Felicie Horning, PA-C 07/04/23 1510

## 2023-07-04 NOTE — ED Notes (Signed)
 Patient transported to CT

## 2023-07-04 NOTE — ED Notes (Signed)
 Pt voided in bedpan but moved bowels too.

## 2023-07-04 NOTE — H&P (Cosign Needed Addendum)
 Date: 07/05/2023               Patient Name:  Christina Byrd MRN: [id number]  DOB: 1966/01/18 Age / Sex: 58 y.o., female   PCP: Carleen Chary, DO         Medical Service: Internal Medicine Teaching Service         Attending Physician: Dr. Sandie Cross, MD      First Contact: Dr. [name] MD Pager [phone]    Second Contact: Dr. 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   Patient reported that she started have abdominal pain yesterday which started "out of the blue".  She denies any food precipitating the abdominal pain.  Patient reports her abdominal pain as intermittently severe and denies any abdominal pain at this moment.  She denied any nausea or vomiting however, she did report 1 episode of a loose bowel movement but is contributing this to eating honey nut Cheerios and drinking pineapple juice.  She denies recent sick contacts, melena, hematemesis, bright red blood per rectum.  Per patient, when she was evaluated by EMS she had a high heart rate.  She stated that when she had this high heart rate, she felt lightheaded/dizzy and had palpations. She denied any chest pain or shortness of breath during this time.  She also reports that she often feels lightheaded/dizzy whenever her blood pressure is "low".  She denies falls caused from feeling lightheaded/dizziness but does report numerous falls due to bilateral lower extremity weakness.  When asked about her oral intake, patient reports that she does not drink any  water  throughout the day and will drink an Ensure whenever she has to take medications.  Husband reported that her last alcoholic drink was 24 hours ago, per patient her last drink was over 2 weeks ago.  The husband also reported concern with her increased intermittent confusion, more daytime somnolence, and insomnia throughout the night which started more than 1 month ago but seems to have progressed.  Of note, patient was in the emergency department yesterday after she was brought in by ambulance for a fall and during that time she was complaining of back pain, but appears that she left prior to being evaluated by a provider.  Review of Systems: A complete ROS was negative except as per HPI.   ED Course: Patient is brought into the emergency department via ambulance.  Pertinent labs: -Lipase 53 -Magnesium  1.0 -CO2 14 -Calcium  7.5, corrected to 9.0 -AST: 123 -ALT: 65 -Alk Phos: 220 -CT abdomen: Findings consistent with acute pancreatitis, Mildly thickened and inflamed ascending colon which may represent sequelae associated with acute colitis. 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  Allopurinol  100 mg BID Lipitor 20mg  Duloxetine  30mg  Gabapentin  100 mg TID  Keppra  750mg  BID Vitamin D   Allergies: Allergies as of 07/04/2023 - Review Complete 07/04/2023  Allergen Reaction Noted   Losartan  potassium  12/20/2012   Levaquin  [levofloxacin  in d5w] Rash 02/06/2013   Ace inhibitors Cough 01/30/2008   Codeine  Nausea Only 05/26/2015   Cefepime  Rash 09/05/2012   Chlorhexidine Itching and Rash 07/09/2022   Vancomycin  Rash 09/05/2012    Past Surgical History:  Procedure Laterality Date   BIOPSY  06/03/2022   Procedure: BIOPSY;  Surgeon:  Elois Hair, MD;  Location: [facility] ENDOSCOPY;  Service: Gastroenterology;;   COLONOSCOPY N/A 06/21/2023   Procedure: COLONOSCOPY;  Surgeon: Nannette Babe, MD;  Location: [facility] ENDOSCOPY;  Service: Gastroenterology;  Laterality: N/A;   ESOPHAGOGASTRODUODENOSCOPY N/A 12/29/2022   Procedure: ESOPHAGOGASTRODUODENOSCOPY (EGD);  Surgeon: Nandigam, Kavitha V, MD;  Location: [facility] ENDOSCOPY;  Service: Gastroenterology;  Laterality: N/A;   ESOPHAGOGASTRODUODENOSCOPY N/A 06/21/2023   Procedure: EGD (ESOPHAGOGASTRODUODENOSCOPY);  Surgeon: Nannette Babe, MD;  Location: [facility] ENDOSCOPY;  Service: Gastroenterology;  Laterality: N/A;   ESOPHAGOGASTRODUODENOSCOPY (EGD) WITH PROPOFOL  N/A 06/03/2022   Procedure: ESOPHAGOGASTRODUODENOSCOPY (EGD) WITH PROPOFOL ;  Surgeon: Elois Hair, MD;  Location: [facility] ENDOSCOPY;  Service: Gastroenterology;  Laterality: N/A;   HOT HEMOSTASIS N/A 06/03/2022   Procedure: HOT HEMOSTASIS (ARGON PLASMA COAGULATION/BICAP);  Surgeon: Elois Hair, MD;  Location: [facility] ENDOSCOPY;  Service: Gastroenterology;  Laterality: N/A;    Social:  Lives With:Husband  Level of Function:Dependent ADLs and iADLs  PCP: Dr. Diann Forth  Substances: Drinks one 1/2 gallon of liquor per week , drugs or tobacco use    Family History:  Family History  Problem Relation Age of Onset   CAD Mother       OBJECTIVE:   Physical Exam: Blood pressure (!) 137/94, pulse (!) 117, temperature 98.8 F (37.1 C), temperature source Rectal, resp. rate 18, height 5\' 2"  (1.575 m), weight 68 kg, last menstrual period 08/23/2011, SpO2 100%.  Constitutional: Chronically ill appearing  HENT: normocephalic atraumatic, mucous membranes moist Cardiovascular: tachycardia and regular rhythm, no m/r/g Pulmonary/Chest: normal work of breathing on room air, lungs clear to auscultation bilaterally.  Abdominal: soft, bowel sounds present, LLQ>RLQ tenderness present  Neurological: alert & oriented x 3, bilateral UE tremors at rest,  CN II-XII intact without obvious deficits, Motor strength: LUE 5/5, RUE 4/5, proximal LLE and RLE 3/5, dorsiflexion is 5/5 in LLE and RLE, plantar flexion is 5/5 in LLE, 4/5 RLE, low effort trying the FNF testing and refused to try heel to shin testing  MSK: Dried blood on the pinky toe, no purulence but tenderness is present,  2+ pitting edema to the level of the mid shin.  Skin: warm and dry Psych: Normal mood and affect  Labs:    Latest Ref Rng & Units 07/04/2023    6:31 PM 07/04/2023    2:26 PM 06/30/2023   11:32 AM  CBC  WBC 4.0 - 10.5 K/uL  9.0  5.5   Hemoglobin 12.0 - 15.0 g/dL 8.5  8.9  95.6   Hematocrit 36.0 - 46.0 % 25.0  27.5  32.1   Platelets 150 - 400 K/uL  300  362        Latest Ref Rng & Units 07/04/2023    6:31 PM 07/04/2023    2:26 PM 06/30/2023   11:32 AM  CMP  Glucose 70 - 99 mg/dL  213  086   BUN 6 - 20 mg/dL  9  10   Creatinine 5.78 - 1.00 mg/dL  4.69  6.29   Sodium 528 - 145 mmol/L  144  143  143   Potassium 3.5 - 5.1 mmol/L 4.1  4.3  4.0   Chloride 98 - 111 mmol/L  116  115   CO2 22 - 32 mmol/L  14  15   Calcium  8.9 - 10.3 mg/dL  7.5  8.0   Total Protein 6.5 - 8.1 g/dL  5.6    Total Bilirubin 0.0 - 1.2 mg/dL  0.6    Alkaline Phos 38 - 126 U/L  220    AST 15 - 41 U/L  123    ALT 0 - 44 U/L  65        Imaging: CT ABDOMEN PELVIS W CONTRAST Result Date: 07/04/2023 CLINICAL DATA:  Abdominal pain. EXAM: CT ABDOMEN AND PELVIS WITH CONTRAST TECHNIQUE: Multidetector CT imaging of the abdomen and pelvis was performed using the standard protocol following bolus administration of intravenous contrast. RADIATION DOSE REDUCTION: This exam was performed according to the departmental dose-optimization program which includes automated exposure control, adjustment of the mA and/or kV according to patient size and/or use of iterative reconstruction technique. CONTRAST:  75mL OMNIPAQUE  IOHEXOL  350 MG/ML SOLN COMPARISON:  December 29, 2022 FINDINGS: Lower chest: No acute abnormality.  Hepatobiliary: There is diffuse fatty infiltration of the liver parenchyma. No focal liver abnormality is seen. No gallstones, gallbladder wall thickening, or biliary dilatation. Pancreas: A mild amount of peripancreatic inflammatory fat stranding and peripancreatic fluid is seen. There is no evidence of pancreatic ductal dilatation. Spleen: Normal in size without focal abnormality. Adrenals/Urinary Tract: There is mild diffuse enlargement the left adrenal gland. The right adrenal gland is unremarkable. Kidneys are normal, without renal calculi, focal lesion, or hydronephrosis. Bladder is unremarkable. Stomach/Bowel: Stomach is within normal limits. Appendix appears normal. No evidence of bowel dilatation. The ascending colon is mildly thickened and inflamed. Vascular/Lymphatic: Aortic atherosclerosis. No enlarged abdominal or pelvic lymph nodes. Reproductive: Uterus and bilateral adnexa are unremarkable. Other: No abdominal wall hernia or abnormality. A mild amount of abdominopelvic ascites is noted. Musculoskeletal: No acute or significant osseous findings. IMPRESSION: 1. Findings consistent with acute pancreatitis. Correlation with pancreatic enzymes is recommended. 2. Mildly thickened and inflamed ascending colon which may represent sequelae associated with acute colitis. 3. Hepatic steatosis. 4. Aortic atherosclerosis. Electronically Signed   By: Virgle Grime M.D.   On: 07/04/2023 19:05      EKG: personally reviewed my interpretation is sinus tachycardia with a prolong Qtc of 564. Prior EKG showed sinus tachycardia   ASSESSMENT & PLAN:   Assessment & Plan by Problem: Principal Problem:   Encephalopathy Active Problems:   Seizure disorder (HCC)   Dysphagia   Alcohol abuse   Normocytic anemia   Hypocalcemia   Hypoalbuminemia due to protein-calorie malnutrition (HCC)   Elevated lipase   Elevated liver enzymes   Falls   Abdominal pain   Weakness   Metabolic acidosis   Christina Byrd is a 58 y.o. person living with a history of seizure disorder, dysphagia, alcohol use disorder, normocytic anemia  who presented to the emergency department with abdominal pain and concerns of a high heart rate  and admitted for encephalopathy on hospital day 0  Encephalopathy, Chronic  Patient presented with a history of mild intermittent confusion, increased daytime somnolence, interrupted sleep pattern, and a history of chronic alcohol use disorder.  Patient's husband reports that her behaviors stated above, have been present for greater than 1 month now.  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  On physical exam, she is alert and oriented x 3, continues to drift to sleep during conversation, a few of her responses to her questions were not appropriate, there is bilateral tremors present bilaterally but there is no evidence of asterixis on exam.  With her history of chronic alcohol use and presence of dietary deficiency, high suspicion for Wernicke's encephalopathy.  Other etiologies considered including acute CVA is less likely at this time:cranial nerves II through XII are intact and this appears to be more chronic and gradual progression, but with the limb weakness we will evaluate for a chronic stroke involving the cerebellum. Hepatic encephalopathy is less likely the cause of her current presentation; there is no evidence of asterixis on exam nor is or evidence of liver fibrosis on the right upper quadrant ultrasound. Since the encephalopathy is more chronic and progressive, low suspicion for infectious causes. Plan: -IV thiamine  500 mg TID for 2-5 days -Folate -MV -TSH ordered -Consider Neurology consult for further evaluation with  -MRI to assess for cerebellum CVA -Hold gabapentin , Zyrtec , and Cymbalta    Ascending Colitis  Abdominal Pain Loose Stools  Patient presents with 1 day  history of abdominal pain with 1 episode of loose bowel movements.  On CT imaging of the abdomen, there is mildly thickened and inflamed ascending colon may represent acute colitis.  On physical exam, she has left lower quadrant pain and right lower quadrant pain.  Although unlikely with 1 loose bowel movement, will evaluate for infectious etiologies of colitis.  Low suspicion for ischemic colitis, patient denies any blood in the stool. Plan: -GI panel ordered -Enteric precautions placed  Pancreatic inflammation on imaging  Elevated Lipase Although there is pancreatic inflammation present on imaging, she lacks the epigastric pain and the lipase is mildly elevated at 53.  Right upper quadrant ultrasound showed no evidence of cholelithiasis or acute cholecystitis, but did show increased common bile duct dilation and she is likely related to the inflamed pancreas.  On previous labs, her lipase appears to be chronically elevated.  Plan: -Fecal Calprotectin ordered   Falls: Weakness: Patient reports increase in the lower extremity weakness and falls has been present for about the past 6 months.  On physical exam, there is a 4/5 right upper extremity motor strength, 4/5 plantarflexion of the right lower extremity and bilateral 3 out of 5 lower extremity strength when asked to raise her legs off the bed.  Fortunately, cranial nerve exam did not reveal any focal deficit including cranial nerves II through XII and her speech was not slurred on exam. In the past, a brain MRI in 2019 did show an old left frontal lobe infarct, which would correlate with right lower extremity weakness.  Given the bilateral lower extremity weakness that appears to be more proximal, will evaluate for PMR as well.  Her weakness may be due to physical deconditioning as well.  B12 was evaluated in the office and was normal at 697, making subacute combined degeneration of the spinal cord less likley. Plan: -PT/OT -CK, CRP, ESR  ordered  Sinus Tachycardia Orthostatic symptoms? Patient reported concerns of increased heart rate at home which was one of the reasons that prompted her to be evaluated.  Patient stated that she felt dizzy/lightheaded whenever her heart rate was high when the EMS arrived.  She denies drinking any water  throughout the day and the last thing she ate was a small portion of her meal yesterday.  Per chart review, patient had tachycardia during last hospitalization.  I suspect her tachycardia is due to poor oral intake/dehydration.  With the last alcoholic drink being 24 hours ago, lower suspicion for tachycardia being due to alcohol withdrawal. Plan: -Encourage oral hydration, if she continues to remain tachycardic will start IVF -Orthostatic BP ordered -Telemetry  -CIWA with symptom trigger ativan  ordered   Alcohol Use disorder History of DT Elevated AST, ALT Patient drinks 1/2 gallon of liquor per week, the last drink was 24 hours prior. Elevated AST and ALT most likely due to alcohol use.  Plan: -CIWA with symptom trigger ativan  ordered  -IV thiamine  -MV -Folate  -Daily CMP  Prolonged Qtc  Hypomagnesemia  Qtc on EKG was prolonged at 564. This is likely due to the hypomagnesemia Plan: -Patient received 4G of magnesium  this evening -Daily Magnesium  labs -Daily EKG until Qtc is normalized   NAGMA, Chronic  Appears chronic with low bicarbonate levels since November 2024 with an anion gap of 10-12 during the majority of that time as well. Will evaluate for causes of Non-anion gap metabolic acidosis including RTA work up with RTA type 2 being the highest on the differential.  Plan: -U/A -Urine osmolar gap -Urine anion gap -Urine Ca/Cr ratio   Dysphagia Hypoalbuminemia due to protein-calorie malnutrition Patient was recently hospitalized from 4/22 to 4/30 for dysphagia where she had an extensive GI workup including EGD and a barium swallow/tablet study.  The EGD showed a normal  esophagus, stomach, and duodenum and the esophagus barium swallow study showed a very small hiatal hernia and poor primary peristaltic wave with incomplete esophageal emptying.  Plan: - Mechanical soft diet ordered  Normocytic Anemia  Patient denies evidence of GI bleeding including melena, bright red blood per rectum, and hematemesis.  In April, she required 1 unit blood transfusion and GI evaluated patient for a bleed with endoscopy and colonoscopy; both did not show evidence of an acute bleed Plan: - Will continue monitor with daily CBC  Seizure Disorder  Stable, patient is on home regimen of 750 mg of Keppra  twice daily. 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 The swelling resolves with compression stocking per patient. BNP is 91.8 and there is no crackles on lung auscultation. Suspect patient has underlying venous stasis and that she is not in an acute heart failure exacerbation.  -TED hose ordered   Gout -Continue allopurinol  100mg  BID daily  GERD -Continue PPI 40mg  BID   Please call family to confirm the medication list!  Diet: Mechanical Soft, Dys 3  VTE: Xarelto   Code: Full  Prior to Admission Living Arrangement: Home, living with husband  Anticipated Discharge Location: Home Barriers to Discharge: Treatment of encephalopathy  Dispo: Admit patient to Observation with expected length of stay less than 2 midnights.  Signed:   Aurora Lees, DO Internal Medicine Resident PGY-1 07/05/2023, 1:35 AM   Please contact the on call pager at [phone].

## 2023-07-05 ENCOUNTER — Other Ambulatory Visit: Payer: Self-pay

## 2023-07-05 ENCOUNTER — Observation Stay (HOSPITAL_COMMUNITY)

## 2023-07-05 ENCOUNTER — Encounter (HOSPITAL_COMMUNITY): Payer: Self-pay | Admitting: Infectious Diseases

## 2023-07-05 DIAGNOSIS — K859 Acute pancreatitis without necrosis or infection, unspecified: Secondary | ICD-10-CM

## 2023-07-05 DIAGNOSIS — G4700 Insomnia, unspecified: Secondary | ICD-10-CM | POA: Diagnosis present

## 2023-07-05 DIAGNOSIS — I5032 Chronic diastolic (congestive) heart failure: Secondary | ICD-10-CM | POA: Diagnosis not present

## 2023-07-05 DIAGNOSIS — E8809 Other disorders of plasma-protein metabolism, not elsewhere classified: Secondary | ICD-10-CM | POA: Diagnosis not present

## 2023-07-05 DIAGNOSIS — R000 Tachycardia, unspecified: Secondary | ICD-10-CM | POA: Diagnosis not present

## 2023-07-05 DIAGNOSIS — R197 Diarrhea, unspecified: Secondary | ICD-10-CM | POA: Diagnosis not present

## 2023-07-05 DIAGNOSIS — E440 Moderate protein-calorie malnutrition: Secondary | ICD-10-CM | POA: Diagnosis not present

## 2023-07-05 DIAGNOSIS — F1010 Alcohol abuse, uncomplicated: Secondary | ICD-10-CM | POA: Diagnosis not present

## 2023-07-05 DIAGNOSIS — R4182 Altered mental status, unspecified: Secondary | ICD-10-CM | POA: Diagnosis not present

## 2023-07-05 DIAGNOSIS — Z8673 Personal history of transient ischemic attack (TIA), and cerebral infarction without residual deficits: Secondary | ICD-10-CM | POA: Diagnosis not present

## 2023-07-05 DIAGNOSIS — K760 Fatty (change of) liver, not elsewhere classified: Secondary | ICD-10-CM | POA: Diagnosis not present

## 2023-07-05 DIAGNOSIS — R296 Repeated falls: Secondary | ICD-10-CM

## 2023-07-05 DIAGNOSIS — R9089 Other abnormal findings on diagnostic imaging of central nervous system: Secondary | ICD-10-CM | POA: Diagnosis not present

## 2023-07-05 DIAGNOSIS — M109 Gout, unspecified: Secondary | ICD-10-CM | POA: Diagnosis not present

## 2023-07-05 DIAGNOSIS — I6782 Cerebral ischemia: Secondary | ICD-10-CM | POA: Diagnosis not present

## 2023-07-05 DIAGNOSIS — R109 Unspecified abdominal pain: Secondary | ICD-10-CM | POA: Diagnosis not present

## 2023-07-05 DIAGNOSIS — K838 Other specified diseases of biliary tract: Secondary | ICD-10-CM | POA: Diagnosis present

## 2023-07-05 DIAGNOSIS — Z1152 Encounter for screening for COVID-19: Secondary | ICD-10-CM | POA: Diagnosis not present

## 2023-07-05 DIAGNOSIS — K529 Noninfective gastroenteritis and colitis, unspecified: Secondary | ICD-10-CM | POA: Diagnosis present

## 2023-07-05 DIAGNOSIS — G9340 Encephalopathy, unspecified: Secondary | ICD-10-CM | POA: Diagnosis not present

## 2023-07-05 DIAGNOSIS — R1310 Dysphagia, unspecified: Secondary | ICD-10-CM | POA: Diagnosis not present

## 2023-07-05 DIAGNOSIS — D638 Anemia in other chronic diseases classified elsewhere: Secondary | ICD-10-CM | POA: Diagnosis not present

## 2023-07-05 DIAGNOSIS — N2589 Other disorders resulting from impaired renal tubular function: Secondary | ICD-10-CM | POA: Diagnosis present

## 2023-07-05 DIAGNOSIS — K219 Gastro-esophageal reflux disease without esophagitis: Secondary | ICD-10-CM | POA: Diagnosis not present

## 2023-07-05 DIAGNOSIS — G312 Degeneration of nervous system due to alcohol: Secondary | ICD-10-CM | POA: Diagnosis not present

## 2023-07-05 DIAGNOSIS — K449 Diaphragmatic hernia without obstruction or gangrene: Secondary | ICD-10-CM | POA: Diagnosis present

## 2023-07-05 DIAGNOSIS — G40909 Epilepsy, unspecified, not intractable, without status epilepticus: Secondary | ICD-10-CM | POA: Diagnosis not present

## 2023-07-05 DIAGNOSIS — R748 Abnormal levels of other serum enzymes: Secondary | ICD-10-CM | POA: Diagnosis not present

## 2023-07-05 DIAGNOSIS — E860 Dehydration: Secondary | ICD-10-CM | POA: Diagnosis not present

## 2023-07-05 DIAGNOSIS — E512 Wernicke's encephalopathy: Secondary | ICD-10-CM | POA: Diagnosis not present

## 2023-07-05 DIAGNOSIS — G621 Alcoholic polyneuropathy: Secondary | ICD-10-CM | POA: Diagnosis not present

## 2023-07-05 DIAGNOSIS — F0150 Vascular dementia without behavioral disturbance: Secondary | ICD-10-CM | POA: Diagnosis not present

## 2023-07-05 DIAGNOSIS — K7011 Alcoholic hepatitis with ascites: Secondary | ICD-10-CM | POA: Diagnosis not present

## 2023-07-05 LAB — BASIC METABOLIC PANEL WITH GFR
Anion gap: 8 (ref 5–15)
BUN: 7 mg/dL (ref 6–20)
CO2: 18 mmol/L — ABNORMAL LOW (ref 22–32)
Calcium: 7.9 mg/dL — ABNORMAL LOW (ref 8.9–10.3)
Chloride: 117 mmol/L — ABNORMAL HIGH (ref 98–111)
Creatinine, Ser: 0.73 mg/dL (ref 0.44–1.00)
GFR, Estimated: >60 mL/min
Glucose, Bld: 99 mg/dL (ref 70–99)
Potassium: 3.8 mmol/L (ref 3.5–5.1)
Sodium: 143 mmol/L (ref 135–145)

## 2023-07-05 LAB — URINALYSIS, ROUTINE W REFLEX MICROSCOPIC
Bilirubin Urine: NEGATIVE
Glucose, UA: NEGATIVE mg/dL
Hgb urine dipstick: NEGATIVE
Ketones, ur: NEGATIVE mg/dL
Leukocytes,Ua: NEGATIVE
Nitrite: NEGATIVE
Protein, ur: NEGATIVE mg/dL
Specific Gravity, Urine: 1.018 (ref 1.005–1.030)
pH: 6 (ref 5.0–8.0)

## 2023-07-05 LAB — VITAMIN B12: Vitamin B-12: 547 pg/mL (ref 180–914)

## 2023-07-05 LAB — RPR: RPR Ser Ql: NONREACTIVE

## 2023-07-05 LAB — CBC
HCT: 26.3 % — ABNORMAL LOW (ref 36.0–46.0)
Hemoglobin: 8.5 g/dL — ABNORMAL LOW (ref 12.0–15.0)
MCH: 31.1 pg (ref 26.0–34.0)
MCHC: 32.3 g/dL (ref 30.0–36.0)
MCV: 96.3 fL (ref 80.0–100.0)
Platelets: 274 10*3/uL (ref 150–400)
RBC: 2.73 MIL/uL — ABNORMAL LOW (ref 3.87–5.11)
RDW: 17.2 % — ABNORMAL HIGH (ref 11.5–15.5)
WBC: 9 10*3/uL (ref 4.0–10.5)
nRBC: 0 % (ref 0.0–0.2)

## 2023-07-05 LAB — NA AND K (SODIUM & POTASSIUM), RAND UR
Potassium Urine: 15 mmol/L
Sodium, Ur: 153 mmol/L

## 2023-07-05 LAB — SALICYLATE LEVEL: Salicylate Lvl: <7 mg/dL — ABNORMAL LOW (ref 7.0–30.0)

## 2023-07-05 LAB — MAGNESIUM: Magnesium: 1.8 mg/dL (ref 1.7–2.4)

## 2023-07-05 LAB — C-REACTIVE PROTEIN: CRP: 0.5 mg/dL (ref <1.0)

## 2023-07-05 LAB — T4, FREE: Free T4: 0.73 ng/dL (ref 0.61–1.12)

## 2023-07-05 LAB — SEDIMENTATION RATE: Sed Rate: 10 mm/h (ref 0–22)

## 2023-07-05 LAB — IRON AND TIBC
Iron: 119 ug/dL (ref 28–170)
Saturation Ratios: 91 % — ABNORMAL HIGH (ref 10.4–31.8)
TIBC: 130 ug/dL — ABNORMAL LOW (ref 250–450)
UIBC: 11 ug/dL

## 2023-07-05 LAB — HIV ANTIBODY (ROUTINE TESTING W REFLEX): HIV Screen 4th Generation wRfx: NONREACTIVE

## 2023-07-05 LAB — CHLORIDE, URINE, RANDOM: Chloride Urine: 144 mmol/L

## 2023-07-05 LAB — ETHANOL: Alcohol, Ethyl (B): <15 mg/dL (ref <15)

## 2023-07-05 LAB — TSH: TSH: 28.867 u[IU]/mL — ABNORMAL HIGH (ref 0.350–4.500)

## 2023-07-05 LAB — FERRITIN: Ferritin: 1284 ng/mL — ABNORMAL HIGH (ref 11–307)

## 2023-07-05 LAB — FOLATE: Folate: 17.1 ng/mL (ref >5.9)

## 2023-07-05 MED ORDER — PANTOPRAZOLE SODIUM 40 MG IV SOLR
40.0000 mg | Freq: Two times a day (BID) | INTRAVENOUS | Status: DC
  Administered 2023-07-05 – 2023-07-08 (×6): 40 mg via INTRAVENOUS
  Filled 2023-07-05 (×6): qty 10

## 2023-07-05 MED ORDER — LEVETIRACETAM (KEPPRA) 500 MG/5 ML ADULT IV PUSH
750.0000 mg | Freq: Two times a day (BID) | INTRAVENOUS | Status: DC
  Administered 2023-07-05 – 2023-07-08 (×6): 750 mg via INTRAVENOUS
  Filled 2023-07-05 (×8): qty 7.5

## 2023-07-05 MED ORDER — LEVETIRACETAM 750 MG PO TABS
750.0000 mg | ORAL_TABLET | Freq: Two times a day (BID) | ORAL | Status: DC
  Filled 2023-07-05: qty 1

## 2023-07-05 MED ORDER — ORAL CARE MOUTH RINSE: 15.0000 mL | OROMUCOSAL | Status: DC | PRN

## 2023-07-05 MED ORDER — LACTATED RINGERS IV BOLUS
1000.0000 mL | Freq: Once | INTRAVENOUS | Status: AC
  Administered 2023-07-05: 1000 mL via INTRAVENOUS

## 2023-07-05 MED ORDER — ACETAMINOPHEN 160 MG/5ML PO SOLN
650.0000 mg | Freq: Four times a day (QID) | ORAL | Status: DC | PRN
  Administered 2023-07-05 – 2023-07-07 (×4): 650 mg via ORAL
  Filled 2023-07-05 (×4): qty 20.3

## 2023-07-05 MED ORDER — SODIUM CHLORIDE 0.9 % IV BOLUS
1000.0000 mL | Freq: Once | INTRAVENOUS | Status: AC
  Administered 2023-07-05: 1000 mL via INTRAVENOUS

## 2023-07-05 NOTE — Progress Notes (Incomplete)
 Initial Nutrition Assessment  DOCUMENTATION CODES:   Severe malnutrition in context of chronic illness  INTERVENTION:   Encourage PO intake Ensure Enlive po TID, each supplement provides 350 kcal and 20 grams of protein. Continue 500 mg IV Thiamine  x 5 days (ends 5/17) Continue MVI with minerals daily  Continue folic acid  daily  Monitor magnesium , potassium, and phosphorus BID for at least 3 days, MD to replete as needed, as pt is at risk for refeeding syndrome  If continued poor PO intake in the setting of increased encephalopathy, recommend cortrak placement.  NUTRITION DIAGNOSIS:   Severe Malnutrition related to chronic illness as evidenced by severe muscle depletion, severe fat depletion.   GOAL:   Patient will meet greater than or equal to 90% of their needs   MONITOR:   PO intake, Supplement acceptance, Labs, I & O's  REASON FOR ASSESSMENT:   Consult Assessment of nutrition requirement/status  ASSESSMENT:   58 y.o female who presented with abdominal pain, poor PO intake, increasing confusion, and high heart rate. Found to have acute pancreatitis, hepatic stenosis, ascending colitis, and encephalopathy. Recent admission 4/21 for hypocalcemia. PMH of ETOH abuse (6 shots per day), seizure disorder, dysphagia, hypothyroidism, GERD, CKD III, anemia, CVA.  5/12: CT abd: Mild pancreatic inflammation concern for acute pancreatitis, mildly inflamed ascending colon, hepatic stenosis   Patient resting in bed, has history of confusion and redirects questions but was able to obtain some history.   Pt and previous RD from admission on 06/13/2023 report she now weighed 106 lbs and has had weight loss in the last couple of months. Pt still confirms she is around 106 lbs or less however, EHR reflects weight gain in the past month. Question wether weight history is accurate. Does have lower extremity edema.  On exam pt does not appear to be 150 lbs. Unable to fully access wasting due  to edema however with what was able to be accessed shows she has severe muscle and fat wasting.   Pt reports she had been eating well after being discharged on 06/22/2023. She had her teeth pulled but still continues to eat regular textured foods. Eats fried foods, collard greens, cornbread, pizza, fast food, and snacks on canned sausage wienies, chips, hard boiled eggs. Reports only drinking 1 bottle of water  and 1 Ensure throughout the day. States she stopped drinking 2 weeks ago however per EHR she drinks 1/2 gallon of liquor per week.   Reports taking medicine and then her abdominal pain started. Per EHR has had multiple falls due to lower extremely weakness. Husband reports her confusion has gotten worse within thee past month. MD ordered IV thiamine  for suspicion of Wernicke's encephalopathy. B1 LAB VALUES <6.   Did not eat breakfast today. Had 1 Ensure. Not willing to try any other ONS besides Ensure at this time.   Admit weight: 68 kg - question accuracy? Current weight: 68 kg    Average Meal Intake: 5/13: 0% intake x 1 recorded meals  Nutritionally Relevant Medications: Scheduled Meds:  allopurinol   200 mg Oral Daily   atorvastatin   20 mg Oral Daily   feeding supplement  237 mL Oral TID BM   folic acid   1 mg Oral Daily   levETIRAcetam   750 mg Intravenous Q12H   multivitamin with minerals  1 tablet Oral Daily   pantoprazole  (PROTONIX ) IV  40 mg Intravenous Q12H   rivaroxaban   10 mg Oral Daily   Continuous Infusions:  thiamine  (VITAMIN B1) injection 500 mg (07/06/23  1045)   Labs Reviewed: Chloride 118 Calcium  8 Magnesium  1.3 AP 220 Albumin 2.1 AST 123 ALT 65 Total protein 5.6 Vitamin B1 <6 Vitamin D  14.04  No recent CBGs HgbA1c 3.7   NUTRITION - FOCUSED PHYSICAL EXAM:  Flowsheet Row Most Recent Value  Orbital Region Mild depletion  Thoracic and Lumbar Region Severe depletion  Buccal Region No depletion  Temple Region No depletion  Clavicle Bone Region Severe  depletion  Clavicle and Acromion Bone Region Severe depletion  Scapular Bone Region Severe depletion  Dorsal Hand Mild depletion  Patellar Region Unable to assess  [Fluid]  Anterior Thigh Region Unable to assess  [Fluid]  Posterior Calf Region Unable to assess  [Fluid]  Hair Reviewed  Eyes Reviewed  Mouth Reviewed  [No teeth]  Skin Reviewed  Nails Reviewed       Diet Order:   Diet Order             DIET DYS 3 Room service appropriate? Yes; Fluid consistency: Thin  Diet effective now                   EDUCATION NEEDS:   Not appropriate for education at this time  Skin:  Skin Assessment: Reviewed RN Assessment  Last BM:  07/06/2023 type 6  Height:   Ht Readings from Last 1 Encounters:  07/04/23 5\' 2"  (1.575 m)    Weight:   Wt Readings from Last 1 Encounters:  07/04/23 68 kg    Ideal Body Weight:  50 kg  BMI:  Body mass index is 27.44 kg/m.  Estimated Nutritional Needs:   Kcal:  1500-1700  Protein:  75-95 gm  Fluid:  >/=1.5L   Frederik Jansky, RD Registered Dietitian  See Amion for more information

## 2023-07-05 NOTE — Progress Notes (Signed)
 Pt. Refused oral med by stating I can't swallow.I Internal medicine contacted and order night med to IV.

## 2023-07-05 NOTE — Progress Notes (Signed)
 HD#0 Subjective:   Summary: Christina Byrd is a 58 y.o. 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   Overnight Events: None  Seen this morning on rounds shortly after admission.  Patient states that she had an appointment but was feeling unwell.  Per patient it seems that she had fears of soiling her bed this morning, attempted to get out of bed and fell.  EMS was called x 2, patient was brought to ED and admitted.  Objective:  Vital signs in last 24 hours: Vitals:   07/05/23 0715 07/05/23 0800 07/05/23 0815 07/05/23 0852  BP: (!) 151/107 (!) 135/98  125/85  Pulse: (!) 111 (!) 118  (!) 107  Resp: 15 20  17   Temp:    98.5 F (36.9 C)  TempSrc:    Oral  SpO2: 100%  100% 100%  Weight:      Height:       Supplemental O2: Room Air SpO2: 100 %   Physical Exam:  Constitutional: Chronically ill-appearing, in no acute distress HENT: normocephalic atraumatic, mucous membranes moist Eyes: conjunctiva non-erythematous, PERRL, EOMI, no nystagmus appreciated Cardiovascular: tachycardic, regular rhythm Pulmonary/Chest: normal work of breathing on room air, lungs clear to auscultation bilaterally Abdominal: soft, non-distended, tenderness to palpation in LLQ and RLQ, positive bowel sounds Neurological: alert, speaking fluently with appropriate responses, tremulous hands and feet Skin: warm and dry Psych: Appropriate affect  Filed Weights   07/04/23 1425  Weight: 68 kg      Intake/Output Summary (Last 24 hours) at 07/05/2023 0911 Last data filed at 07/04/2023 1919 Gross per 24 hour  Intake 1000 ml  Output --  Net 1000 ml   Net IO Since Admission: 1,000 mL [07/05/23 0911]  Pertinent Labs:    Latest Ref Rng & Units 07/05/2023    4:57 AM 07/04/2023    6:31 PM 07/04/2023    2:26 PM  CBC  WBC  4.0 - 10.5 K/uL 9.0   9.0   Hemoglobin 12.0 - 15.0 g/dL 8.5  8.5  8.9   Hematocrit 36.0 - 46.0 % 26.3  25.0  27.5   Platelets 150 - 400 K/uL 274   300        Latest Ref Rng & Units 07/05/2023    4:57 AM 07/04/2023    6:31 PM 07/04/2023    2:26 PM  CMP  Glucose 70 - 99 mg/dL 99   696   BUN 6 - 20 mg/dL 7   9   Creatinine 2.95 - 1.00 mg/dL 2.84   1.32   Sodium 440 - 145 mmol/L 143  144  143   Potassium 3.5 - 5.1 mmol/L 3.8  4.1  4.3   Chloride 98 - 111 mmol/L 117   116   CO2 22 - 32 mmol/L 18   14   Calcium  8.9 - 10.3 mg/dL 7.9   7.5   Total Protein 6.5 - 8.1 g/dL   5.6   Total Bilirubin 0.0 - 1.2 mg/dL   0.6   Alkaline Phos 38 - 126 U/L   220   AST 15 - 41 U/L   123   ALT 0 - 44 U/L   65    Imaging: MRI revealed  chronic small vessel ischemia and volume loss, 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  Assessment/Plan:   Principal Problem:   Encephalopathy Active Problems:   Seizure disorder (HCC)   Dysphagia   Alcohol abuse   Normocytic anemia   Hypocalcemia   Hypoalbuminemia due to protein-calorie malnutrition (HCC)   Elevated lipase   Elevated liver enzymes   Falls   Abdominal pain   Weakness   Metabolic acidosis   Patient Summary: Christina Byrd is a 58 y.o. 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  Encephalopathy, Chronic  Falls Weakness History of chronic alcohol use disorder, CVA, vascular disease. Of note, there has been concern of confusion and/or confabulation documented, and her history was inconsistent today. On physical exam, she is alert and speaking fluently with appropriate responses. MRI showed chronic small vessel ischemia, multiple prior infarcts, and volume loss with no acute intracranial  abnormality. B12, Folate within normal limits. HIV non-reactive. Perhaps Wernicke-Korsakoff in the setting of chronic alcohol use. Also possible is vascular dementia given the MRI findings and progressive decline in cognitive function.  Patient reports ongoing lower extremity weakness and falls has been present for about the past 6 months. On the differential are ataxia 2/2 Wernicke encephalopathy, old left frontal lobe infarct, deconditioning, and vitamin deficiency. CK, CRP, and ESR all within normal limits, making polymyalgia rheumatica less likely. - IV thiamine  500 mg TID for 3-5 days  - B1 and B6 ordered - PT/OT consult - RPR in process - Hold gabapentin , Zyrtec , and Cymbalta   Ascending Colitis  Abdominal Pain Loose Stools  Patient presents with 1 day history of abdominal pain with 1 loose bowel movement. On CT imaging of the abdomen, there is mildly thickened and inflamed ascending colon may represent acute colitis. - Continue to monitor for symptoms - GI panel ordered - Enteric precautions placed   Pancreatic inflammation on imaging  Elevated Lipase Generalized abdominal pain, though resolves with distraction on exam.  She does have trivial chronic lipase elevation, CT did show some concern for pancreatitis.  Clinically I do not feel that she meets criteria for diagnosis of acute pancreatitis. - Fecal Calprotectin ordered    Sinus Tachycardia Orthostatic symptoms? Patient reported concerns of increased heart rate at home which was one of the reasons that prompted her to be evaluated.  Orthostatic vitals pending. Persistent tachycardia likely due to poor oral intake/dehydration.  Per admitting team, patient endorses very poor p.o. intake. Could also be secondary to alcohol withdrawal. Plan: - Telemetry  - Encourage oral hydration. If she continues to remain tachycardic will start IVF - Orthostatic BP ordered - CIWA with symptom trigger ativan  ordered    Alcohol Use  disorder History of DT Elevated AST, ALT Ascites Patient drinks 1/2 gallon of liquor per week (about 6 shots daily).  Per patient her last drink was several weeks ago, however patient's husband does disclose that her the last drink was 36 hours prior. Elevated AST and ALT in 2:1 ratio argues for alcoholic hepatitis/recent alcohol use.  Right upper quadrant ultrasound did show hepatic steatosis, recent EGD did not show evidence of varices. - CIWA with symptom trigger ativan  ordered  - IV thiamine  as above - Daily CMP and magnesium    Prolonged Qtc  Hypomagnesemia, resolved Qtc on EKG was prolonged at 564. This is likely due to the hypomagnesemia, medication effect.  Mag has improved to 1.8  after 4g Mg last night.  QTc improved but persistently prolonged. - 4g of magnesium  with goal > 2 - Daily Magnesium  labs -Monitor QTc, avoid QT prolonging medications  Elevated TSH  TSH elevated on admit, free T4 normal.  Would recommend continued follow-up/outside of acute illness.   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 UA was normal/negative, urine sodium was 153, urine potassium was 15, and urine chloride was 144. Urine anion gap calculated of 24, suggesting RTA. - Urine Ca/Cr ratio pending   Dysphagia, chronic Hypoalbuminemia due to protein-calorie malnutrition - Mechanical soft diet ordered   Normocytic Anemia  Patient denies evidence of GI bleeding including melena, bright red blood per rectum, and hematemesis.  In April, she required 1 unit blood transfusion and GI evaluated patient for a bleed with endoscopy and colonoscopy; neither showed evidence of an acute bleed. - Monitor with daily CBC - Ferritin greatly elevated, - Iron normal, TIBC decreased. Saturation admission 1 point   Seizure Disorder, stable - Home IV Keppra  750 mg BID. 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 The swelling resolves with compression stocking per patient. BNP was 91.8 yesterday and there were no crackles on lung auscultation this morning. Suspect patient has underlying venous stasis and that she is not in an acute heart failure exacerbation.  - TED hose ordered    Gout, stable - Continue allopurinol  100mg  BID daily   GERD, stable - Continue PPI 40mg  BID    Diet: Mechanical Soft, Dys 3  IVF: PIV VTE: Xarelto   Code: Full PT/OT recs: SNF < 3 days TOC recs: Family update: Attempted to call partner - Mr. Moore for collateral without success    Prior to Admission Living Arrangement: Home, living with husband  Anticipated Discharge Location: Home Barriers to Discharge: Treatment of encephalopathy   Dispo: Admit patient to Observation with expected length of stay less than 2 midnights. OT recommended SNF.  Lynnea Satchel Medical Student  Attestation for Student Documentation:  I personally was present and re-performed the history, physical exam and medical decision-making activities of this service and have verified that the service and findings are accurately documented in the student's note.  [name], MD 07/05/2023, 1:55 PM

## 2023-07-05 NOTE — Evaluation (Signed)
 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 Recent adm 4/23 for swallowing dysfunction.     Clinical Impressions Patient admitted for the diagnosis above.  PTA she lives at home, states she spends most of the day in bed, and walks to the bathroom for ADL and toileting with assist.  Patient states her spouse is supposed to provide 24 hour support, but is generally out of the home most of the day, leaving her alone.  Patient can get to the 3n1 at home, but has fallen in the past.  OT will follow in the acute setting to address deficits, and Patient will benefit from continued inpatient follow up therapy, <3 hours/day.  Generalized Min to Mod A for transfers and closer to Max A for lower body ADL.       If plan is discharge home, recommend the following:   Assistance with cooking/housework;Assist for transportation;Help with stairs or ramp for entrance;A lot of help with bathing/dressing/bathroom;A lot of help with walking and/or transfers     Functional Status Assessment   Patient has had a recent decline in their functional status and demonstrates the ability to make significant improvements in function in a reasonable and predictable amount of time.     Equipment Recommendations   None recommended by OT     Recommendations for Other Services         Precautions/Restrictions   Precautions Precautions: Fall Recall of Precautions/Restrictions: Intact Restrictions Weight Bearing Restrictions Per Provider Order: No     Mobility Bed Mobility Overal bed mobility: Needs Assistance Bed Mobility: Supine to Sit, Sit to  Supine     Supine to sit: Mod assist Sit to supine: Max assist        Transfers Overall transfer level: Needs assistance Equipment used: None Transfers: Sit to/from Stand, Bed to chair/wheelchair/BSC Sit to Stand: Min assist     Step pivot transfers: Min assist            Balance Overall balance assessment: Needs assistance Sitting-balance support: Feet unsupported Sitting balance-Leahy Scale: Fair     Standing balance support: Bilateral upper extremity supported Standing balance-Leahy Scale: Poor                             ADL either performed or assessed with clinical judgement   ADL   Eating/Feeding: Set up;Sitting   Grooming: Set up;Sitting   Upper Body Bathing: Moderate assistance;Sitting;Cueing for compensatory techniques;Minimal assistance   Lower Body Bathing: Moderate assistance;Maximal assistance;Sitting/lateral leans;Sit to/from stand;Cueing for sequencing;Cueing for safety;Cueing for compensatory techniques   Upper Body Dressing : Minimal assistance;Sitting;Cueing for compensatory techniques   Lower Body Dressing: Maximal assistance;Sitting/lateral leans;Sit to/from stand   Toilet Transfer: Minimal assistance;BSC/3in1;Stand-pivot                   Vision Patient Visual Report: No change from baseline       Perception Perception: Not tested       Praxis Praxis: Not tested       Pertinent Vitals/Pain Pain Assessment Pain Assessment: Faces Faces Pain Scale: Hurts even more Pain Location: B feet and L wrist  Pain Descriptors / Indicators: Grimacing, Guarding Pain Intervention(s): Limited activity within patient's tolerance     Extremity/Trunk Assessment Upper Extremity Assessment RUE Deficits / Details: decreased shoulder ROM, pt reporting at baseline; generalized weakness LUE Deficits / Details: decreased shoulder ROM, pt reporting at baseline; generalized weakness   Lower Extremity Assessment Lower Extremity  Assessment: Defer to PT evaluation   Cervical / Trunk Assessment Cervical / Trunk Assessment: Kyphotic   Communication Communication Communication: No apparent difficulties Factors Affecting Communication: Reduced clarity of speech   Cognition Arousal: Alert   Cognition: No apparent impairments                               Following commands: Intact       Cueing  General Comments   Cueing Techniques: Verbal cues;Gestural cues   VSS on RA   Exercises     Shoulder Instructions      Home Living Family/patient expects to be discharged to:: Private residence Living Arrangements: Spouse/significant other;Other (Comment) Available Help at Discharge: Family;Available PRN/intermittently Type of Home: House Home Access: Ramped entrance     Home Layout: One level     Bathroom Shower/Tub: Chief Strategy Officer: Standard Bathroom Accessibility: Yes How Accessible: Accessible via wheelchair Home Equipment: Rolling Walker (2 wheels);BSC/3in1;Shower seat          Prior Functioning/Environment               Mobility Comments: Pt states husband assists by holding her while walking ADLs Comments: Pt's husband assists her with dressing, bathing, toileting, and IADLs.    OT Problem List: Decreased strength;Decreased activity tolerance;Impaired balance (sitting and/or standing);Decreased cognition;Decreased knowledge of use of DME or AE   OT Treatment/Interventions: Self-care/ADL training;Therapeutic exercise;DME and/or AE instruction;Therapeutic activities;Cognitive remediation/compensation;Patient/family education;Balance training      OT Goals(Current goals can be found in the care plan section)   Acute Rehab OT Goals Patient Stated Goal: Get stronger OT Goal Formulation: With patient Time For Goal Achievement: 07/19/23 Potential to Achieve Goals: Fair   OT Frequency:  Min 2X/week    Co-evaluation              AM-PAC OT "6  Clicks" Daily Activity     Outcome Measure Help from another person eating meals?: None Help from another person taking care of personal grooming?: A Little Help from another person toileting, which includes using toliet, bedpan, or urinal?: A Lot Help from another person bathing (including washing, rinsing, drying)?: A Lot Help from another person to put on and taking off regular upper body clothing?: A Lot Help from another person to put on and taking off regular lower body clothing?: A Lot 6 Click Score: 15   End of Session Nurse Communication: Mobility status  Activity Tolerance: Patient tolerated treatment well Patient left: in bed;with call bell/phone within reach  OT Visit Diagnosis: Muscle weakness (generalized) (M62.81);Other (comment);Adult, failure to thrive (R62.7)                Time: 5409-8119 OT Time Calculation (min): 24 min Charges:  OT General Charges $OT Visit: 1 Visit OT Evaluation $OT Eval Moderate Complexity: 1 Mod OT Treatments $Self Care/Home Management : 8-22 mins  07/05/2023  RP, OTR/L  Acute Rehabilitation Services  Office:  [phone]   Benjamen Brand 07/05/2023, 8:44 AM

## 2023-07-05 NOTE — Progress Notes (Signed)
   07/05/23 1520  Assess: MEWS Score  Temp 98 F (36.7 C)  BP 127/68  MAP (mmHg) 85  Pulse Rate (!) 115  Resp 16  Level of Consciousness Alert  SpO2 100 %  O2 Device Room Air  Assess: MEWS Score  MEWS Temp 0  MEWS Systolic 0  MEWS Pulse 2  MEWS RR 0  MEWS LOC 0  MEWS Score 2  MEWS Score Color Yellow  Assess: if the MEWS score is Yellow or Red  Were vital signs accurate and taken at a resting state? Yes  Does the patient meet 2 or more of the SIRS criteria? No  MEWS guidelines implemented  Yes, yellow  Treat  MEWS Interventions Considered administering scheduled or prn medications/treatments as ordered  Take Vital Signs  Increase Vital Sign Frequency  Yellow: Q2hr x1, continue Q4hrs until patient remains green for 12hrs  Escalate  MEWS: Escalate Yellow: Discuss with charge nurse and consider notifying provider and/or RRT  Notify: Charge Nurse/RN  Name of Charge Nurse/RN Notified Evette, RN  Provider Notification  Provider Name/Title [name] MD  Date Provider Notified 07/05/23  Time Provider Notified 1524  Method of Notification Page  Notification Reason Other (Comment) (yellow mews)  Provider response See new orders;Other (Comment) (IV bolus ordered)  Date of Provider Response 07/05/23  Time of Provider Response 1535  Assess: SIRS CRITERIA  SIRS Temperature  0  SIRS Respirations  0  SIRS Pulse 1  SIRS WBC 0  SIRS Score Sum  1

## 2023-07-05 NOTE — Discharge Instructions (Signed)

## 2023-07-05 NOTE — Evaluation (Signed)
 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  CT positive for pancreatitis. Recent adm 4/23 for swallowing dysfunction.  Clinical Impression  Patient presents with decreased mobility due to LE weakness (R>L), decreased balance, history of multiple falls and decreased activity tolerance.  Patient with multiple recent admissions and reports feeling need to get intensive rehab to progress strength, balance and endurance to allow return home with intermittent family support.  She states has fallen approximately 4-5 times in past 3 weeks and she does not want to continue to fall.  Feel she will benefit from post-acute inpatient rehab (<3 hours/day) prior to d/c home.        If plan is discharge home, recommend the following: A little help with walking and/or transfers;A little help with bathing/dressing/bathroom;Assist for transportation;Help with stairs or ramp for entrance;Assistance with cooking/housework;Supervision due to cognitive status   Can travel by private vehicle        Equipment Recommendations Rolling walker (2 wheels);BSC/3in1;Wheelchair (measurements PT);Wheelchair cushion (measurements PT)  Recommendations for Other Services       Functional Status Assessment Patient has had a recent decline in their functional status and demonstrates the ability to make significant improvements in function in a reasonable and predictable amount of time.     Precautions / Restrictions Precautions Precautions: Fall Recall of Precautions/Restrictions: Intact      Mobility  Bed Mobility Overal bed mobility: Needs Assistance       Supine  to sit: HOB elevated, Used rails, Contact guard     General bed mobility comments: assist for balance    Transfers Overall transfer level: Needs assistance Equipment used: Rolling walker (2 wheels) Transfers: Sit to/from Stand Sit to Stand: Min assist           General transfer comment: cues for hand placement sit to stand; to EOB with cues for positioning    Ambulation/Gait Ambulation/Gait assistance: Contact guard assist Gait Distance (Feet): 120 Feet Assistive device: Rolling walker (2 wheels) Gait Pattern/deviations: Step-through pattern, Decreased stride length       General Gait Details: decreased foot clearance, assist for balance no LOB  Stairs            Wheelchair Mobility     Tilt Bed    Modified Rankin (Stroke Patients Only)       Balance Overall balance assessment: Needs assistance   Sitting balance-Leahy Scale: Fair     Standing balance support: Bilateral upper extremity supported, Reliant on assistive device for balance Standing balance-Leahy Scale: Poor                               Pertinent Vitals/Pain Pain Assessment Pain Assessment: Faces Faces Pain Scale: Hurts even more Pain Location: feet Pain Descriptors / Indicators: Guarding, Discomfort Pain Intervention(s): Monitored during session    Home Living                          Prior Function                       Extremity/Trunk Assessment  Upper Extremity Assessment Upper Extremity Assessment: Defer to OT evaluation    Lower Extremity Assessment Lower Extremity Assessment: RLE deficits/detail;LLE deficits/detail RLE Deficits / Details: AAROM WFL, strength hip flexion 2-/5, knee extension 3-/5, ankle DF 3+/5 RLE Sensation: history of peripheral neuropathy LLE Deficits / Details: AROM WFL, strength hip flexion 3-/5, knee extension 4-/5, ankle DF 4-/5 LLE Sensation: history of peripheral neuropathy    Cervical / Trunk  Assessment Cervical / Trunk Assessment: Kyphotic  Communication   Communication Communication: No apparent difficulties    Cognition Arousal: Alert Behavior During Therapy: WFL for tasks assessed/performed   PT - Cognitive impairments: No apparent impairments                         Following commands: Intact       Cueing Cueing Techniques: Verbal cues     General Comments General comments (skin integrity, edema, etc.): VSS, spouse in the room    Exercises     Assessment/Plan    PT Assessment Patient needs continued PT services  PT Problem List Decreased strength;Decreased mobility;Decreased safety awareness;Decreased balance;Decreased activity tolerance       PT Treatment Interventions DME instruction;Therapeutic exercise;Gait training;Balance training;Stair training;Functional mobility training;Therapeutic activities;Patient/family education    PT Goals (Current goals can be found in the Care Plan section)  Acute Rehab PT Goals Patient Stated Goal: to go to rehab PT Goal Formulation: With patient Time For Goal Achievement: 07/19/23 Potential to Achieve Goals: Good    Frequency Min 2X/week     Co-evaluation               AM-PAC PT "6 Clicks" Mobility  Outcome Measure Help needed turning from your back to your side while in a flat bed without using bedrails?: A Little Help needed moving from lying on your back to sitting on the side of a flat bed without using bedrails?: A Little Help needed moving to and from a bed to a chair (including a wheelchair)?: A Little Help needed standing up from a chair using your arms (e.g., wheelchair or bedside chair)?: A Little Help needed to walk in hospital room?: A Little Help needed climbing 3-5 steps with a railing? : Total 6 Click Score: 16    End of Session Equipment Utilized During Treatment: Gait belt Activity Tolerance: Patient limited by fatigue Patient left: in bed;with call bell/phone within  reach;with family/visitor present (seated EOB)   PT Visit Diagnosis: Other abnormalities of gait and mobility (R26.89);Muscle weakness (generalized) (M62.81);Repeated falls (R29.6)    Time: 4098-1191 PT Time Calculation (min) (ACUTE ONLY): 26 min   Charges:   PT Evaluation $PT Eval Moderate Complexity: 1 Mod PT Treatments $Gait Training: 8-22 mins PT General Charges $$ ACUTE PT VISIT: 1 Visit         Abigail Hoff, PT Acute Rehabilitation Services Office:[phone] 07/05/2023   Marley Simmers 07/05/2023, 5:00 PM

## 2023-07-05 NOTE — Plan of Care (Signed)
  Problem: Clinical Measurements: Goal: Cardiovascular complication will be avoided Outcome: Progressing   Problem: Activity: Goal: Risk for activity intolerance will decrease Outcome: Progressing   Problem: Coping: Goal: Level of anxiety will decrease Outcome: Progressing   Problem: Elimination: Goal: Will not experience complications related to bowel motility Outcome: Progressing

## 2023-07-05 NOTE — ED Notes (Signed)
 OT at bedside.

## 2023-07-06 DIAGNOSIS — G312 Degeneration of nervous system due to alcohol: Secondary | ICD-10-CM

## 2023-07-06 DIAGNOSIS — K859 Acute pancreatitis without necrosis or infection, unspecified: Secondary | ICD-10-CM | POA: Diagnosis not present

## 2023-07-06 LAB — CALCIUM / CREATININE RATIO, URINE
Calcium, Ur: 4 mg/dL
Calcium/Creat.Ratio: 202 mg/g{creat} (ref 29–442)
Creatinine, Urine: 19.8 mg/dL

## 2023-07-06 LAB — CBC
HCT: 27.4 % — ABNORMAL LOW (ref 36.0–46.0)
Hemoglobin: 8.4 g/dL — ABNORMAL LOW (ref 12.0–15.0)
MCH: 31.3 pg (ref 26.0–34.0)
MCHC: 30.7 g/dL (ref 30.0–36.0)
MCV: 102.2 fL — ABNORMAL HIGH (ref 80.0–100.0)
Platelets: 216 10*3/uL (ref 150–400)
RBC: 2.68 MIL/uL — ABNORMAL LOW (ref 3.87–5.11)
RDW: 17 % — ABNORMAL HIGH (ref 11.5–15.5)
WBC: 7.3 10*3/uL (ref 4.0–10.5)
nRBC: 0 % (ref 0.0–0.2)

## 2023-07-06 LAB — BASIC METABOLIC PANEL WITH GFR
Anion gap: 5 (ref 5–15)
BUN: 8 mg/dL (ref 6–20)
CO2: 18 mmol/L — ABNORMAL LOW (ref 22–32)
Calcium: 8 mg/dL — ABNORMAL LOW (ref 8.9–10.3)
Chloride: 118 mmol/L — ABNORMAL HIGH (ref 98–111)
Creatinine, Ser: 0.73 mg/dL (ref 0.44–1.00)
GFR, Estimated: >60 mL/min (ref >60)
Glucose, Bld: 88 mg/dL (ref 70–99)
Potassium: 3.9 mmol/L (ref 3.5–5.1)
Sodium: 141 mmol/L (ref 135–145)

## 2023-07-06 LAB — GASTROINTESTINAL PANEL BY PCR, STOOL (REPLACES STOOL CULTURE)

## 2023-07-06 LAB — MAGNESIUM: Magnesium: 1.3 mg/dL — ABNORMAL LOW (ref 1.7–2.4)

## 2023-07-06 LAB — CALPROTECTIN, FECAL: Calprotectin, Fecal: 62 ug/g (ref 0–120)

## 2023-07-06 MED ORDER — ENSURE ENLIVE PO LIQD
237.0000 mL | Freq: Three times a day (TID) | ORAL | Status: DC
  Administered 2023-07-06: 237 mL via ORAL

## 2023-07-06 MED ORDER — MAGNESIUM SULFATE 2 GM/50ML IV SOLN
2.0000 g | Freq: Once | INTRAVENOUS | Status: AC
  Administered 2023-07-06: 2 g via INTRAVENOUS
  Filled 2023-07-06: qty 50

## 2023-07-06 NOTE — Progress Notes (Signed)
 Informed Dr Alwin Baars and Dr Delon Ferris--, Pt refused to take oral meds, unless she will eat sandwich first. 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  Informed Mds, Pt given all the IV meds and  refused all the oral medication.

## 2023-07-06 NOTE — NC FL2 (Signed)
 [place]  MEDICAID FL2 LEVEL OF CARE FORM     IDENTIFICATION  Patient Name: Christina Byrd Birthdate: 01/27/1966 Sex: female Admission Date (Current Location): 07/04/2023  [facility] and [place] Number:  Producer, television/film/video and Address:  [facility] [place], [address], [place], [place] [address]      Provider Number: [id number]  Attending Physician Name and Address:  Sandie Cross, MD  Relative Name and Phone Number:  Moore,Clayton Significant other [phone]    Current Level of Care: Hospital Recommended Level of Care: Skilled Nursing Facility Prior Approval Number:    Date Approved/Denied:   PASRR Number: [id number]  Discharge Plan: SNF    Current Diagnoses: Patient Active Problem List   Diagnosis Date Noted   Acute pancreatitis 07/05/2023   Encephalopathy 07/04/2023   Elevated liver enzymes 07/04/2023   Falls frequently 07/04/2023   Abdominal pain 07/04/2023   Weakness 07/04/2023   Metabolic acidosis 07/04/2023   Physical deconditioning 06/22/2023   History of non anemic vitamin B12 deficiency 06/20/2023   Dehydration 06/18/2023   Aspiration pneumonia of left lower lobe (HCC) 06/18/2023   AKI (acute kidney injury) (HCC) 06/13/2023   Aortic atherosclerosis (HCC) 05/26/2023   Nausea & vomiting 05/03/2023   Alcoholic peripheral neuropathy (HCC) 05/03/2023   Breakthrough seizure (HCC) 01/23/2023   Localized skin desquamation 01/06/2023   Hypothyroidism 01/06/2023   Vitamin D  deficiency 01/06/2023   CKD (chronic kidney disease) stage 3, GFR 30-59 ml/min (HCC) 01/06/2023   Abnormal LFTs 12/28/2022   Chronic diarrhea 12/28/2022   Heme positive stool 12/28/2022   Protein-calorie malnutrition, severe (HCC) 12/27/2022   Electrolyte abnormality 12/26/2022   Hypocalcemia 11/19/2022   Hypoalbuminemia due to protein-calorie malnutrition (HCC) 11/19/2022   Hyperlipidemia 11/19/2022   Elevated lipase 11/19/2022   Osteoarthritis  09/02/2022   History of noncompliance with medical treatment 07/27/2022   Hepatic steatosis 07/27/2022   Acute pain of right knee 07/08/2022   Transaminitis 07/08/2022   Normocytic anemia 07/08/2022   Alcohol withdrawal syndrome without complication (HCC) 07/07/2022   Duodenal ulcer hemorrhage 06/03/2022   AVM (arteriovenous malformation) of stomach, 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   Anemia due to chronic blood loss 06/03/2022   Dysphagia 03/28/2020   Cervicalgia 03/28/2020   Hot flashes 01/30/2020   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   Depression 07/08/2016   Seizure disorder (HCC) 07/08/2016   Cocaine use    Gout of left ankle 10/09/2015   Left hip pain 09/02/2015   Colonoscopy refused 09/02/2015   ETOH abuse 09/02/2015   Hyperuricemia 05/27/2015   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   Left shoulder pain 04/25/2015   Allergic rhinitis 05/13/2014   GERD without esophagitis 04/01/2014   Osteoarthritis of left knee 12/11/2013    Orientation RESPIRATION BLADDER Height & Weight     Self, Time, Situation, Place  Normal Continent Weight: 150 lb (68 kg) Height:  5\' 2"  (157.5 cm)  BEHAVIORAL SYMPTOMS/MOOD NEUROLOGICAL BOWEL NUTRITION STATUS      Continent Diet (see d/c summary)  AMBULATORY STATUS COMMUNICATION OF NEEDS Skin   Extensive Assist Verbally Normal                       Personal Care Assistance Level of Assistance  Bathing, Feeding, Dressing Bathing Assistance: Limited assistance Feeding assistance: Independent Dressing Assistance: Limited assistance     Functional Limitations Info  Sight, Hearing, Speech Sight Info: Impaired Hearing Info: Adequate Speech Info: Adequate  SPECIAL CARE FACTORS FREQUENCY  OT (By licensed OT), PT (By licensed PT)     PT Frequency: 5x/week OT Frequency: 5x/week            Contractures Contractures Info: Not present    Additional Factors Info  Code Status, Allergies  Code Status Info: full code Allergies Info: Losartan  Potassium;Levaquin  (levofloxacin  In D5w);Ace Inhibitors;Codeine ;Cefepime ;Chlorhexidine;Vancomycin            Current Medications (07/06/2023):  This is the current hospital active medication list Current Facility-Administered Medications  Medication Dose Route Frequency Provider Last Rate Last Admin   acetaminophen  (TYLENOL ) 160 MG/5ML solution 650 mg  650 mg Oral Q6H PRN [name], MD   650 mg at 07/06/23 0055   allopurinol  (ZYLOPRIM ) tablet 200 mg  200 mg Oral Daily Aurora Lees, DO   200 mg at 07/05/23 1024   atorvastatin  (LIPITOR) tablet 20 mg  20 mg Oral Daily Bender, Sherline Distel, DO   20 mg at 07/05/23 1024   folic acid  (FOLVITE ) tablet 1 mg  1 mg Oral Daily Bender, Sherline Distel, DO   1 mg at 07/05/23 1025   levETIRAcetam  (KEPPRA ) undiluted injection 750 mg  750 mg Intravenous Q12H Bender, Sherline Distel, DO   750 mg at 07/06/23 1038   LORazepam  (ATIVAN ) tablet 1-4 mg  1-4 mg Oral Q1H PRN Aurora Lees, DO       Or   LORazepam  (ATIVAN ) injection 1-4 mg  1-4 mg Intravenous Q1H PRN Aurora Lees, DO   1 mg at 07/05/23 0223   magnesium  sulfate IVPB 2 g 50 mL  2 g Intravenous Once Youssefzadeh, Keon, MD 50 mL/hr at 07/06/23 1205 2 g at 07/06/23 1205   multivitamin with minerals tablet 1 tablet  1 tablet Oral Daily Aurora Lees, DO   1 tablet at 07/05/23 1025   Oral care mouth rinse  15 mL Mouth Rinse PRN Sandie Cross, MD       pantoprazole  (PROTONIX ) injection 40 mg  40 mg Intravenous Q12H Bender, Sherline Distel, DO   40 mg at 07/06/23 1030   rivaroxaban  (XARELTO ) tablet 10 mg  10 mg Oral Daily Aurora Lees, DO   10 mg at 07/05/23 1026   thiamine  (VITAMIN B1) 500 mg in sodium chloride  0.9 % 50 mL IVPB  500 mg Intravenous TID Aurora Lees, DO 110 mL/hr at 07/06/23 1045 500 mg at 07/06/23 1045     Discharge Medications: Please see discharge summary for a list of discharge medications.  Relevant Imaging Results:  Relevant Lab  Results:   Additional Information SSN #: [id number]  Katrinka Parr, LCSW

## 2023-07-06 NOTE — TOC Initial Note (Signed)
 Transition of Care [facility]) - Initial/Assessment Note    Patient Details  Name: Christina Byrd MRN: [id number] Date of Birth: 11-Dec-1965  [facility] CM/SW Contact:    Katrinka Parr, LCSW Phone Number: 07/06/2023, 1:12 PM  Clinical Narrative:                  CSW met with pt to discuss SNF recommendation. Pt is agreeable to SNF w/u. CSW explains referral process and insurance auth. Fl2 completed and bed requests sent in hub. Pt also requests information for her medicaid insurance so she can call them. CSW provided pt with name of insurance, phone number, and member ID.   Expected Discharge Plan: Skilled Nursing Facility Barriers to Discharge: Continued Medical Work up, English as a second language teacher, SNF Pending bed offer   Patient Goals and CMS Choice    Wants to rehab at SNF for 1 week                          Activities of Daily Living   ADL Screening (condition at time of admission) Independently performs ADLs?: No Does the patient have a NEW difficulty with bathing/dressing/toileting/self-feeding that is expected to last >3 days?: Yes (Initiates electronic notice to provider for possible OT consult) Does the patient have a NEW difficulty with getting in/out of bed, walking, or climbing stairs that is expected to last >3 days?: Yes (Initiates electronic notice to provider for possible PT consult) Does the patient have a NEW difficulty with communication that is expected to last >3 days?: No Is the patient deaf or have difficulty hearing?: No Does the patient have difficulty seeing, even when wearing glasses/contacts?: No Does the patient have difficulty concentrating, remembering, or making decisions?: No  Permission Sought/Granted                  Emotional Assessment Appearance:: Appears stated age Attitude/Demeanor/Rapport: Complaining, Engaged Affect (typically observed): Adaptable Orientation: : Oriented to Self, Oriented to Place, Oriented to   Time, Oriented to Situation      Admission diagnosis:  Encephalopathy [G93.40] Acute pancreatitis, unspecified complication status, unspecified pancreatitis type [K85.90] Patient Active Problem List   Diagnosis Date Noted   Acute pancreatitis 07/05/2023   Encephalopathy 07/04/2023   Elevated liver enzymes 07/04/2023   Falls frequently 07/04/2023   Abdominal pain 07/04/2023   Weakness 07/04/2023   Metabolic acidosis 07/04/2023   Physical deconditioning 06/22/2023   History of non anemic vitamin B12 deficiency 06/20/2023   Dehydration 06/18/2023   Aspiration pneumonia of left lower lobe (HCC) 06/18/2023   AKI (acute kidney injury) (HCC) 06/13/2023   Aortic atherosclerosis (HCC) 05/26/2023   Nausea & vomiting 05/03/2023   Alcoholic peripheral neuropathy (HCC) 05/03/2023   Breakthrough seizure (HCC) 01/23/2023   Localized skin desquamation 01/06/2023   Hypothyroidism 01/06/2023   Vitamin D  deficiency 01/06/2023   CKD (chronic kidney disease) stage 3, GFR 30-59 ml/min (HCC) 01/06/2023   Abnormal LFTs 12/28/2022   Chronic diarrhea 12/28/2022   Heme positive stool 12/28/2022   Protein-calorie malnutrition, severe (HCC) 12/27/2022   Electrolyte abnormality 12/26/2022   Hypocalcemia 11/19/2022   Hypoalbuminemia due to protein-calorie malnutrition (HCC) 11/19/2022   Hyperlipidemia 11/19/2022   Elevated lipase 11/19/2022   Osteoarthritis 09/02/2022   History of noncompliance with medical treatment 07/27/2022   Hepatic steatosis 07/27/2022   Acute pain of right knee 07/08/2022   Transaminitis 07/08/2022   Normocytic anemia 07/08/2022   Alcohol withdrawal syndrome without complication (HCC) 07/07/2022  Duodenal ulcer hemorrhage 06/03/2022   AVM (arteriovenous malformation) of stomach, 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   Anemia due to chronic blood loss 06/03/2022   Dysphagia 03/28/2020   Cervicalgia 03/28/2020   Hot flashes 01/30/2020   Idiopathic chronic gout of right knee  without tophus 01/30/2020   Depression 07/08/2016   Seizure disorder (HCC) 07/08/2016   Cocaine use    Gout of left ankle 10/09/2015   Left hip pain 09/02/2015   Colonoscopy refused 09/02/2015   ETOH abuse 09/02/2015   Hyperuricemia 05/27/2015   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   Left shoulder pain 04/25/2015   Allergic rhinitis 05/13/2014   GERD without esophagitis 04/01/2014   Osteoarthritis of left knee 12/11/2013   PCP:  Carleen Chary, DO Pharmacy:   [id number] [place], [place] - [address] AT [facility] OF [address] [place] [address] Phone: [phone] Fax: [phone]  [id number] - [place], [place] - [address] AT [facility] [address] [facility] [address] [place] [address] Phone: [phone] Fax: [phone]  [facility] [address] [place] [address] Phone: [phone] Fax: [phone]     Social Drivers of Health (SDOH) Social History: SDOH Screenings   Food Insecurity: No Food Insecurity (07/05/2023)  Housing: Low Risk  (07/05/2023)  Transportation Needs: No Transportation Needs (07/05/2023)  Utilities: Not At Risk (07/05/2023)  Depression (PHQ2-9): Low Risk  (06/30/2023)  Tobacco Use: Medium Risk (07/05/2023)   SDOH Interventions:     Readmission Risk Interventions    06/22/2023    2:16 PM 11/21/2022    1:01 PM  Readmission Risk Prevention Plan  Transportation Screening Complete Complete  Medication Review Oceanographer) Complete Complete  PCP or Specialist appointment within 3-5 days of discharge Complete Complete  HRI or Home Care Consult Complete Complete  SW Recovery Care/Counseling Consult Complete   Palliative Care Screening Not Applicable Not Applicable  Skilled Nursing Facility Patient Refused Not Applicable

## 2023-07-06 NOTE — Progress Notes (Addendum)
 HD#1 Subjective:   Summary: Christina Byrd is a 58 y.o. 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  Overnight Events: Patient reports feeling stronger than on admission, moving her legs and getting up to use the bathroom independently. She shared that she had one bowel movement this morning with formed stools, not loose like before. She expresses preference for transfer to SNF for rehab to "get stronger." Nursing reported that patient endorsed dysphagia and requested IV/IM/liquid medications, and after rounds patient told nursing about sharp LLQ pain. OT and PT both recommended SNF for short-term rehab < 3 days.  Objective:  Vital signs in last 24 hours: Vitals:   07/05/23 1520 07/05/23 1703 07/05/23 2011 07/06/23 0750  BP: 127/68 (!) 130/94 (!) 133/93 (!) 140/92  Pulse: (!) 115 (!) 117  (!) 105  Resp: 16 17 18 16   Temp: 98 F (36.7 C) 98.3 F (36.8 C) 98.9 F (37.2 C) 98.1 F (36.7 C)  TempSrc: Oral Oral Oral Oral  SpO2: 100% 100% 100% 100%  Weight:      Height:       Supplemental O2: Room Air SpO2: 100 %   Physical Exam:  Constitutional: Well-appearing, in no acute distress HENT: normocephalic atraumatic, mucous membranes moist Eyes: conjunctiva non-erythematous Cardiovascular: regular rate and rhythm, no m/r/g Pulmonary/Chest: normal work of breathing on room air, lungs clear to auscultation bilaterally Abdominal: soft, non-tender, non-distended, positive bowel sounds Neurological: alert, responding appropriately Skin: warm and dry Psych: Normal mood, appropriate affect  Pertinent Labs:    Latest Ref Rng & Units 07/05/2023    4:57 AM 07/04/2023    6:31 PM 07/04/2023    2:26 PM  CBC  WBC 4.0 - 10.5 K/uL 9.0   9.0   Hemoglobin 12.0 - 15.0 g/dL 8.5  8.5  8.9   Hematocrit 36.0 - 46.0 % 26.3  25.0  27.5   Platelets 150 - 400 K/uL 274   300    Ferritin:  1284     Latest Ref Rng & Units 07/05/2023    4:57 AM 07/04/2023    6:31 PM 07/04/2023    2:26 PM  CMP  Glucose 70 - 99 mg/dL 99   914   BUN 6 - 20 mg/dL 7   9   Creatinine 7.82 - 1.00 mg/dL 9.56   2.13   Sodium 086 - 145 mmol/L 143  144  143   Potassium 3.5 - 5.1 mmol/L 3.8  4.1  4.3   Chloride 98 - 111 mmol/L 117   116   CO2 22 - 32 mmol/L 18   14   Calcium  8.9 - 10.3 mg/dL 7.9   7.5   Total Protein 6.5 - 8.1 g/dL   5.6   Total Bilirubin 0.0 - 1.2 mg/dL   0.6   Alkaline Phos 38 - 126 U/L   220   AST 15 - 41 U/L   123   ALT 0 - 44 U/L   65    Micro: - HIV negative - RPR non-reactive - GIPP negative  Assessment/Plan:   Patient Summary: Christina Byrd is a 58 y.o. 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  Active Hospital Problems Encephalopathy, Chronic  Falls Weakness History  of chronic alcohol use and evidence of vascular disease on brain MRI. Patient endorses improved strength and expresses interest in discharge to SNF for rehab. On physical exam, patient is alert and responding more appropriately than yesterday. HIV and RPR non-reactive. Likely multifactorial - vascular dementia on differential given stepwise functional decline. Wernicke is possible given alcohol use and weakness/ataxia improving on high-dose thiamine , but not likely given lack of nystagmus/ophthalmologic findings. PT/OT following; we appreciate their input. They recommended placement in SNF rehab for < 3 days. - Continue IV thiamine  500 mg  - B1 and B6 pending - Social work consulted for SNF rehab placement - Hold gabapentin , Zyrtec , and Cymbalta    Ascending Colitis  Abdominal Pain Loose Stools, resolved  Pancreatic inflammation on imaging  Mildly Elevated Lipase, chronic Patient had one formed  bowel movement and endorses some LLQ sharp pains. Abdominal exam was unremarkable this morning. GIPP came back negative. - Enteric precautions discontinued - Fecal calprotectin pending  Sinus Tachycardia  Patient is persistently tachycardic s/p NS bolus 1000 mL, ranging from 105-117. She is otherwise hemodynamically stable and reported no symptoms of tachycardia. Over last 24 hrs CIWA scores were 0-1 with no ativan  given. - Patient refusing telemetry  Alcohol Use disorder History of DT Elevated AST, ALT consistent with alcoholic hepatitis/alcohol use Ascites Patient drinks 1/2 gallon of liquor per week (about 6 shots daily).  Elevated AST and ALT in 2:1 ratio. - CIWA as above - IV thiamine  as above - Daily CMP and magnesium   Prolonged Qtc  Hypomagnesemia, resolved Mag low again this AM, will replete  - If mag < 2, repeat 2g of mag - Daily Magnesium  labs - Monitor QTc and avoid QT prolonging medications  Elevated TSH  TSH elevated on admit, free T4 normal.  Would recommend continued follow-up/outside of acute illness.  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 Urine anion gap calculated of 24, suggesting RTA. - Urine Ca/Cr ratio pending   Dysphagia, chronic Hypoalbuminemia due to protein-calorie malnutrition - Mechanical soft diet ordered - PO meds transitioned to IV/IM - Patient refusing all PO meds, would like to eat sandwich - Importance of taking medications discussed - Importance of dysphagia diet discussed   Normocytic Anemia  Patient has history of blood transfusions and was found to have high iron and high iron saturations during her last hospitalization, and ferritin was high at 1284 yesterday, maybe consistent with iron overload vs anemia of chronic disease. 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 PT/OT recommended SNF for short-term rehab.  Lynnea Satchel, Medical Student  Internal Medicine Attending:  I was physically present during the critical or key portions of the resident provided service and participated in formulating the plan  of care and medical decision making for the patient as documented in the resident's note. Looking into short term SNF Alliah Boulanger, Aretha Kubas, MD

## 2023-07-06 NOTE — Evaluation (Signed)
 Clinical/Bedside Swallow Evaluation Patient Details  Name: Christina Byrd MRN: [id number] Date of Birth: 04/02/1965  Today's Date: 07/06/2023 Time: SLP Start Time (ACUTE ONLY): 1150 SLP Stop Time (ACUTE ONLY): 1205 SLP Time Calculation (min) (ACUTE ONLY): 15 min  Past Medical History:  Past Medical History:  Diagnosis Date   GASTROESOPHAGEAL REFLUX, NO ESOPHAGITIS 04/21/2006   Qualifier: Diagnosis of  By: WATT, JOANNE     GERD (gastroesophageal reflux disease) Dx 1995   Hot flash, menopausal 06/22/2016   Hot flashes 01/30/2020   HTN (hypertension)    Hypotension 10/21/2022   Patient's blood pressure at presentation was 87/65, follow up blood pressure was 81/58. Patient reports regurgitating everything she has taken po for the past month. Denies lightheadedness or dizziness. Discussed GI referral, though patient stated she would be going to the emergency room today for IV fluids.   -GI referral for upper endoscopy placed  -Patient planning on going to the ED for IV flu   Intertrigo 06/22/2016   Nausea & vomiting 12/23/2022   Nausea and vomiting 12/24/2022   Pain and swelling of left ankle 12/02/2014   Seizures (HCC)    Tendinitis of right hip flexor 07/23/2014   Tendonitis, Achilles, right 04/01/2014   Past Surgical History:  Past Surgical History:  Procedure Laterality Date   BIOPSY  06/03/2022   Procedure: BIOPSY;  Surgeon: Elois Hair, MD;  Location: [facility] ENDOSCOPY;  Service: Gastroenterology;;   COLONOSCOPY N/A 06/21/2023   Procedure: COLONOSCOPY;  Surgeon: Nannette Babe, MD;  Location: [facility] ENDOSCOPY;  Service: Gastroenterology;  Laterality: N/A;   ESOPHAGOGASTRODUODENOSCOPY N/A 12/29/2022   Procedure: ESOPHAGOGASTRODUODENOSCOPY (EGD);  Surgeon: Nandigam, Kavitha V, MD;  Location: [facility] ENDOSCOPY;  Service: Gastroenterology;  Laterality: N/A;   ESOPHAGOGASTRODUODENOSCOPY N/A 06/21/2023   Procedure: EGD (ESOPHAGOGASTRODUODENOSCOPY);  Surgeon: Nannette Babe, MD;  Location: [facility]  ENDOSCOPY;  Service: Gastroenterology;  Laterality: N/A;   ESOPHAGOGASTRODUODENOSCOPY (EGD) WITH PROPOFOL  N/A 06/03/2022   Procedure: ESOPHAGOGASTRODUODENOSCOPY (EGD) WITH PROPOFOL ;  Surgeon: Elois Hair, MD;  Location: [facility] ENDOSCOPY;  Service: Gastroenterology;  Laterality: N/A;   HOT HEMOSTASIS N/A 06/03/2022   Procedure: HOT HEMOSTASIS (ARGON PLASMA COAGULATION/BICAP);  Surgeon: Elois Hair, MD;  Location: [facility] ENDOSCOPY;  Service: Gastroenterology;  Laterality: N/A;   HPI:  Patient is a 58 y.o. female with PMH: ETOH use (with previous DT's), seizure, falls, CVA, GERD.  She presented to the hospital on 07/04/23 with 24 hours of cramping, abdominal pain and decreased PO intake. She was started on a mechanical soft solids diet. SLP swallow evaluation ordered on 07/06/23.   She had a modified barium swallow study with SLP in November of 2024 with limited participation but showing no penetration, aspiration or pharyngeal residuals of barium. She was most recently seen in April 2025 with SLP completing swallow evaluation at bedside with patient's oropharyngeal swallow appearing [facility] despite patient lethargy and recommendation for GI input due to her esophageal symptoms. Barium esophagram completed on 06/15/23 which reported normal pharyngeal phase of swallow but showed esophageal motility: Poor primary peristaltic wave with incomplete esophageal emptying. Tertiary contractions noted, very small hiatal hernia with associated B-ring, Gastroesophageal reflux: Spontaneous gastroesophageal reflux with regurgitation of barium. EGD completed 06/21/2023 which reported normal esophagus, normal stomach.    Assessment / Plan / Recommendation  Clinical Impression  Patient is not presenting with clinical s/s of dysphagia as per this bedside swallow evaluation. She denies having any GERD or reflux symptoms. SLP assessed her swallow as she fed herself graham crackers and peanut  butter. Considering that she is  edentulous, mastication appeared [facility]. Swallow initiation appeared timely and no overt s/s aspiration observed. SLP reviewed recent past notes in chart regarding patient's dysphagia and suspect that she continues to have a primary esophageal dysphagia. From SLP's perspective, patient can advance to regular texture solids and no further skilled intervention warranted at this time. Recommend GI f/u.      Aspiration Risk  Mild aspiration risk    Diet Recommendation Regular;Thin liquid    Liquid Administration via: Cup;Straw Medication Administration: Other (Comment) (as tolerated) Supervision: Patient able to self feed Compensations: Slow rate;Small sips/bites Postural Changes: Seated upright at 90 degrees;Remain upright for at least 30 minutes after po intake    Other  Recommendations Recommended Consults: Consider GI evaluation Oral Care Recommendations: Oral care BID    Recommendations for follow up therapy are one component of a multi-disciplinary discharge planning process, led by the attending physician.  Recommendations may be updated based on patient status, additional functional criteria and insurance authorization.  Follow up Recommendations No SLP follow up      Assistance Recommended at Discharge    Functional Status Assessment Patient has not had a recent decline in their functional status  Frequency and Duration            Prognosis        Swallow Study   General Date of Onset: 07/06/23 HPI: Patient is a 57 y.o. female with PMH: ETOH use (with previous DT's), seizure, falls, CVA, GERD.  She presented to the hospital on 07/04/23 with 24 hours of cramping, abdominal pain and decreased PO intake. She was started on a mechanical soft solids diet. SLP swallow evaluation ordered on 07/06/23.   She had a modified barium swallow study with SLP in November of 2024 with limited participation but showing no penetration, aspiration or pharyngeal residuals of barium. She was most  recently seen in April 2025 with SLP completing swallow evaluation at bedside with patient's oropharyngeal swallow appearing [facility] despite patient lethargy and recommendation for GI input due to her esophageal symptoms. Barium esophagram completed on 06/15/23 which reported normal pharyngeal phase of swallow but showed esophageal motility: Poor primary peristaltic wave with incomplete esophageal emptying. Tertiary contractions noted, very small hiatal hernia with associated B-ring, Gastroesophageal reflux: Spontaneous gastroesophageal reflux with regurgitation of barium. EGD completed 06/21/2023 which reported normal esophagus, normal stomach. Type of Study: Bedside Swallow Evaluation Previous Swallow Assessment: BSE's with most recent being 06/13/23 Diet Prior to this Study: Dysphagia 3 (mechanical soft);Thin liquids (Level 0) Respiratory Status: Room air History of Recent Intubation: No Behavior/Cognition: Cooperative;Pleasant mood;Alert Oral Cavity Assessment: Within Functional Limits Oral Care Completed by SLP: No Oral Cavity - Dentition: Edentulous Vision: Functional for self-feeding Self-Feeding Abilities: Able to feed self Patient Positioning: Upright in bed Baseline Vocal Quality: Normal Volitional Cough: Strong Volitional Swallow: Able to elicit    Oral/Motor/Sensory Function Overall Oral Motor/Sensory Function: Within functional limits   Ice Chips     Thin Liquid Thin Liquid: Within functional limits Presentation: Straw;Self Fed    Nectar Thick     Honey Thick     Puree     Solid     Solid: Within functional limits Presentation: Self Fed      Jacqualine Mater, MA, CCC-SLP Speech Therapy

## 2023-07-07 DIAGNOSIS — R1310 Dysphagia, unspecified: Secondary | ICD-10-CM

## 2023-07-07 DIAGNOSIS — K859 Acute pancreatitis without necrosis or infection, unspecified: Secondary | ICD-10-CM | POA: Diagnosis not present

## 2023-07-07 DIAGNOSIS — G312 Degeneration of nervous system due to alcohol: Secondary | ICD-10-CM | POA: Diagnosis not present

## 2023-07-07 DIAGNOSIS — F1010 Alcohol abuse, uncomplicated: Secondary | ICD-10-CM | POA: Diagnosis not present

## 2023-07-07 LAB — COMPREHENSIVE METABOLIC PANEL WITH GFR
ALT: 32 U/L (ref 0–44)
AST: 37 U/L (ref 15–41)
Albumin: 1.8 g/dL — ABNORMAL LOW (ref 3.5–5.0)
Alkaline Phosphatase: 168 U/L — ABNORMAL HIGH (ref 38–126)
Anion gap: 6 (ref 5–15)
BUN: 7 mg/dL (ref 6–20)
CO2: 19 mmol/L — ABNORMAL LOW (ref 22–32)
Calcium: 7.8 mg/dL — ABNORMAL LOW (ref 8.9–10.3)
Chloride: 114 mmol/L — ABNORMAL HIGH (ref 98–111)
Creatinine, Ser: 0.73 mg/dL (ref 0.44–1.00)
GFR, Estimated: >60 mL/min (ref >60)
Glucose, Bld: 76 mg/dL (ref 70–99)
Potassium: 3.7 mmol/L (ref 3.5–5.1)
Sodium: 139 mmol/L (ref 135–145)
Total Bilirubin: 0.4 mg/dL (ref 0.0–1.2)
Total Protein: 4.9 g/dL — ABNORMAL LOW (ref 6.5–8.1)

## 2023-07-07 LAB — CBC
HCT: 23.8 % — ABNORMAL LOW (ref 36.0–46.0)
Hemoglobin: 7.7 g/dL — ABNORMAL LOW (ref 12.0–15.0)
MCH: 31.3 pg (ref 26.0–34.0)
MCHC: 32.4 g/dL (ref 30.0–36.0)
MCV: 96.7 fL (ref 80.0–100.0)
Platelets: 212 10*3/uL (ref 150–400)
RBC: 2.46 MIL/uL — ABNORMAL LOW (ref 3.87–5.11)
RDW: 16.7 % — ABNORMAL HIGH (ref 11.5–15.5)
WBC: 7.3 10*3/uL (ref 4.0–10.5)
nRBC: 0 % (ref 0.0–0.2)

## 2023-07-07 LAB — VITAMIN B1: Vitamin B1 (Thiamine): 293.7 nmol/L — ABNORMAL HIGH (ref 66.5–200.0)

## 2023-07-07 LAB — VITAMIN B6

## 2023-07-07 LAB — MAGNESIUM: Magnesium: 1.6 mg/dL — ABNORMAL LOW (ref 1.7–2.4)

## 2023-07-07 MED ORDER — ENOXAPARIN SODIUM 40 MG/0.4ML IJ SOSY
40.0000 mg | PREFILLED_SYRINGE | INTRAMUSCULAR | Status: DC
  Filled 2023-07-07 (×2): qty 0.4

## 2023-07-07 MED ORDER — ORAL CARE MOUTH RINSE
15.0000 mL | OROMUCOSAL | Status: DC
  Administered 2023-07-07 – 2023-07-08 (×2): 15 mL via OROMUCOSAL

## 2023-07-07 MED ORDER — MAGNESIUM SULFATE 2 GM/50ML IV SOLN
2.0000 g | Freq: Once | INTRAVENOUS | Status: AC
Start: 2023-07-07 — End: 2023-07-07
  Administered 2023-07-07: 2 g via INTRAVENOUS
  Filled 2023-07-07: qty 50

## 2023-07-07 MED ORDER — ACETAMINOPHEN 160 MG/5ML PO SOLN
650.0000 mg | Freq: Four times a day (QID) | ORAL | Status: DC
  Administered 2023-07-07 – 2023-07-08 (×5): 650 mg via ORAL
  Filled 2023-07-07 (×5): qty 20.3

## 2023-07-07 MED ORDER — ORAL CARE MOUTH RINSE: 15.0000 mL | OROMUCOSAL | Status: DC | PRN

## 2023-07-07 NOTE — TOC Progression Note (Addendum)
 Transition of Care [facility]) - Progression Note    Patient Details  Name: Christina Byrd MRN: [id number] Date of Birth: Mar 21, 1965  [facility]) CM/SW Contact  Katrinka Parr, [place] Phone Number: 07/07/2023, 9:24 AM  Clinical Narrative:     No SNF bed offers; CSW expanded SNF bed search.   1515: Still no bed offers at this time  Expected Discharge Plan: Skilled Nursing Facility Barriers to Discharge: Continued Medical Work up, English as a second language teacher, SNF Pending bed offer             Social Determinants of Health (SDOH) Interventions SDOH Screenings   Food Insecurity: No Food Insecurity (07/05/2023)  Housing: Low Risk  (07/05/2023)  Transportation Needs: No Transportation Needs (07/05/2023)  Utilities: Not At Risk (07/05/2023)  Depression (PHQ2-9): Low Risk  (06/30/2023)  Tobacco Use: Medium Risk (07/05/2023)    Readmission Risk Interventions    06/22/2023    2:16 PM 11/21/2022    1:01 PM  Readmission Risk Prevention Plan  Transportation Screening Complete Complete  Medication Review Oceanographer) Complete Complete  PCP or Specialist appointment within 3-5 days of discharge Complete Complete  HRI or Home Care Consult Complete Complete  SW Recovery Care/Counseling Consult Complete   Palliative Care Screening Not Applicable Not Applicable  Skilled Nursing Facility Patient Refused Not Applicable

## 2023-07-07 NOTE — Plan of Care (Signed)

## 2023-07-07 NOTE — Progress Notes (Signed)
 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  CT positive for pancreatitis. Recent adm 4/23 for swallowing dysfunction.    PT Comments  Pt received in supine and agreeable to session. Pt demonstrates impaired awareness of deficits and safety awareness requiring frequent cues and assist throughout session. Pt declines use of RW and states she has not been using it. Pt demonstrates increased instability without UE support and multiple LOB requiring min A to correct. Pt continues to state "I won't fall" despite instability and history of falls. Pt attempting to briefly run at one point requiring cues for safety and pt stating "she won't let me run" to passing staff. Pt able to complete serial STS x5 with single UE support and x5 with BUE support. Pt continues to benefit from PT services to progress toward functional mobility goals.    If plan is discharge home, recommend the following: A little help with walking and/or transfers;A little help with bathing/dressing/bathroom;Assist for transportation;Help with stairs or ramp for entrance;Assistance with cooking/housework;Supervision due to cognitive status   Can travel by private vehicle        Equipment Recommendations  Rolling walker (2 wheels);BSC/3in1;Wheelchair (measurements PT);Wheelchair cushion (measurements PT)    Recommendations for Other Services       Precautions / Restrictions Precautions Precautions: Fall Recall of Precautions/Restrictions: Intact Restrictions Weight Bearing Restrictions Per Provider Order: No     Mobility  Bed Mobility Overal bed mobility: Needs Assistance Bed  Mobility: Supine to Sit     Supine to sit: HOB elevated, Used rails, Contact guard     General bed mobility comments: assist with blankets and increased time    Transfers Overall transfer level: Needs assistance Equipment used: None Transfers: Sit to/from Stand Sit to Stand: Contact guard assist           General transfer comment: STS from EOB with single UE and BUE support with CGA for safety. Pt unable without UE support    Ambulation/Gait Ambulation/Gait assistance: Min assist Gait Distance (Feet): 75 Feet Assistive device: None Gait Pattern/deviations: Step-through pattern, Decreased stride length, Staggering left, Staggering right, Trunk flexed Gait velocity: decr     General Gait Details: Pt declines use of RW or HHA. Pt demonstrates increased instability without UE support and intermittently reaches out to rail for support. Pt demostrates multiple mild LOB requiring min A to correct. Pt requires frequent cues for safety due to decreased awareness and pt attempting to briefly run at one point.   Stairs             Wheelchair Mobility     Tilt Bed    Modified Rankin (Stroke Patients Only)       Balance Overall balance assessment: Needs assistance Sitting-balance support: Feet supported Sitting balance-Leahy Scale: Good Sitting balance - Comments: sitting EOB   Standing balance support: No upper extremity supported, During functional activity Standing balance-Leahy Scale: Poor Standing balance comment: increased instability without UE support                            Communication Communication Communication: No apparent difficulties  Cognition Arousal: Alert Behavior During Therapy: Impulsive  PT - Cognitive impairments: Safety/Judgement, Awareness                       PT - Cognition Comments: Pt with poor safety awareness and awareness of deficits requiring increased cues. Pt stating multiple times "i won't fall"  despite frequent LOB Following commands: Intact Following commands impaired: Only follows one step commands consistently    Cueing Cueing Techniques: Verbal cues  Exercises General Exercises - Lower Extremity Long Arc Quad: AROM, Both, 10 reps, Seated Hip Flexion/Marching: AROM, Both, 10 reps, Seated Other Exercises Other Exercises: Serial STS 2 sets x5 reps with single UE support progressing to BUE support with increased fatigue    General Comments        Pertinent Vitals/Pain Pain Assessment Pain Assessment: No/denies pain     PT Goals (current goals can now be found in the care plan section) Acute Rehab PT Goals Patient Stated Goal: to go to rehab PT Goal Formulation: With patient Time For Goal Achievement: 07/19/23 Progress towards PT goals: Progressing toward goals    Frequency    Min 2X/week       AM-PAC PT "6 Clicks" Mobility   Outcome Measure  Help needed turning from your back to your side while in a flat bed without using bedrails?: A Little Help needed moving from lying on your back to sitting on the side of a flat bed without using bedrails?: A Little Help needed moving to and from a bed to a chair (including a wheelchair)?: A Little Help needed standing up from a chair using your arms (e.g., wheelchair or bedside chair)?: A Little Help needed to walk in hospital room?: A Little Help needed climbing 3-5 steps with a railing? : Total 6 Click Score: 16    End of Session Equipment Utilized During Treatment: Gait belt Activity Tolerance: Patient tolerated treatment well Patient left: in bed;with call bell/phone within reach;with family/visitor present;with bed alarm set (sitting EOB) Nurse Communication: Mobility status;Other (comment) (pt sitting EOB with bed alarm set) PT Visit Diagnosis: Other abnormalities of gait and mobility (R26.89);Muscle weakness (generalized) (M62.81);Repeated falls (R29.6)     Time: 4540-9811 PT Time Calculation (min)  (ACUTE ONLY): 23 min  Charges:    $Gait Training: 8-22 mins $Therapeutic Exercise: 8-22 mins PT General Charges $$ ACUTE PT VISIT: 1 Visit                     Michaelle Adolphus, PTA Acute Rehabilitation Services Secure Chat Preferred  [phone]    Michaelle Adolphus 07/07/2023, 10:35 AM

## 2023-07-07 NOTE — Progress Notes (Signed)
 HD#2 Subjective:   Summary: Christina Byrd is a 58 y.o. 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  Overnight Events: None  Ms. Zaworski reports feeling stronger from yesterday. She also endorsed pain in her fingertips and toes. She continued refusing oral meds because she reports trouble swallowing large pills. She took tylenol  x 2 and has not needed to take ativan  since 5/13. RD consulted and recommended total 5 days high dose thiamine  and adding Ensure Enlive TID for chronic malnutrition. CSW began working with her on SNF referral for short-term rehab.   Her tachycardia was stable, ranging from 100-120 overnight. Vital signs stable otherwise.  Objective:  Vital signs in last 24 hours: Vitals:   07/06/23 0750 07/06/23 1533 07/07/23 0439 07/07/23 0903  BP: (!) 140/92 (!) 140/98 (!) 134/90 (!) 137/94  Pulse: (!) 105 (!) 109 98 91  Resp: 16 18 18 17   Temp: 98.1 F (36.7 C) 98.8 F (37.1 C) 98.2 F (36.8 C) 98.2 F (36.8 C)  TempSrc: Oral Oral Oral Oral  SpO2: 100% 100% 100% 100%  Weight:      Height:       Supplemental O2: Room Air SpO2: 100 %   Physical Exam:  Constitutional: Well-appearing, in no acute distress HENT: normocephalic atraumatic, mucous membranes moist Eyes: conjunctiva non-erythematous Cardiovascular: Tachycardic rate and rhythm Pulmonary/Chest: normal work of breathing on room air, lungs clear to auscultation bilaterally Skin: warm and dry Psych: Euthymic, appropriate affect  Pertinent Labs:    Latest Ref Rng & Units 07/07/2023    6:37 AM 07/06/2023    9:41 AM 07/05/2023    4:57 AM  CBC  WBC 4.0 - 10.5 K/uL 7.3  7.3  9.0   Hemoglobin 12.0 - 15.0 g/dL 7.7  8.4  8.5   Hematocrit 36.0 - 46.0 % 23.8  27.4  26.3   Platelets 150 - 400 K/uL 212  216  274        Latest Ref Rng & Units 07/07/2023    6:37 AM 07/06/2023    9:41 AM 07/05/2023     4:57 AM  CMP  Glucose 70 - 99 mg/dL 76  88  99   BUN 6 - 20 mg/dL 7  8  7    Creatinine 0.44 - 1.00 mg/dL 1.61  0.96  0.45   Sodium 135 - 145 mmol/L 139  141  143   Potassium 3.5 - 5.1 mmol/L 3.7  3.9  3.8   Chloride 98 - 111 mmol/L 114  118  117   CO2 22 - 32 mmol/L 19  18  18    Calcium  8.9 - 10.3 mg/dL 7.8  8.0  7.9   Total Protein 6.5 - 8.1 g/dL 4.9     Total Bilirubin 0.0 - 1.2 mg/dL 0.4     Alkaline Phos 38 - 126 U/L 168     AST 15 - 41 U/L 37     ALT 0 - 44 U/L 32      Fecal calprotectin: 62 within normal limits  Assessment/Plan:   Patient Summary: Christina Byrd is a 58 y.o. 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 Patient reports improvement in  strength and sensation, and neuro exam is unchanged from yesterday. PT, OT, and RD following; appreciate their input. - Per RD, continue IV thiamine  500 mg today and tomorrow for total of 5 days (IV in hospital, transition to PO outpatient) - B1 and B6 pending - Social work consulted for SNF rehab placement - Hold gabapentin , Zyrtec , and Cymbalta      Alcohol Use Disorder with history of DT Hypomagnesemia Patient drinks 1/2 gallon of liquor per week (about 6 shots daily). Mag low again this AM at 1.6. - Magnesium  2 g ordered - CIWA monitor - IV thiamine  as above - Daily CMP and magnesium  - Monitor QTc and avoid QT prolonging medications  Abdominal Pain and Loose Stools, resolved Patient did not complain of abdominal pain today and endorsed formed bowel movements. Fecal calprotectin was within normal limits. - Enteric precautions discontinued  Elevated TSH  TSH elevated on admit, free T4 normal. Would recommend  continued follow-up/outside of acute illness.   Chronic Conditions Sinus Tachycardia, chronic Patient is persistently tachycardic in the hospital. Upon chart review, she also appears persistently tachycardic for months to years prior at clinic visits ranging in the 110-120s. May in part be due to anemia. 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 Urine anion gap 24, suggesting RTA. Urine Ca/Cr ratio 202 mg/g, within normal limits. - Continue to monitor NAGMA outpatient   Dysphagia, chronic Hypoalbuminemia due to protein-calorie malnutrition - Mechanical soft diet ordered - PO meds transitioned to IV/IM - Patient refusing PO meds, in particular states vitamins have side effects for her. D/c vitamins, encouraged to take PO meds, VTE PPX. - Importance of taking medications discussed - Importance of dysphagia diet discussed   Normocytic Anemia  Patient has history of blood transfusions and was found to have high iron, iron saturations, and ferritin, perhaps consistent with iron overload vs. anemia of chronic disease. Hgb 7.7 today down from 8.4 yesterday and 8.9 on admission, likely from serial labs and rehydration.  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 - TED hose ordered - Patient refusing tele    Gout, stable - Continue allopurinol  200mg  daily   GERD, stable - Continue PPI 40mg  BID    Diet: Mechanical Soft, Dys 3  IVF: PIV VTE: Xarelto ,refused several days, encouraged to take  Code: Full PT/OT recs: SNF < 3 days Family update: Spouse at bedside and amenable to plan    Prior to Admission Living Arrangement: Home, living with husband  Anticipated Discharge Location: Home Barriers to Discharge: SNF placement   Dispo: Admit patient to Observation  with expected length of stay less than 2 midnights. PT/OT recommended SNF for short-term rehab.  Lynnea Satchel, Medical Student  Attestation for Student Documentation:  I personally was present and re-performed the history, physical exam and medical decision-making activities of this service and have verified that the service and findings are accurately documented in the student's note.  [name], MD 07/07/2023, 10:15 AM

## 2023-07-07 NOTE — Plan of Care (Signed)
   Problem: Education: Goal: Knowledge of General Education information will improve Description: Including pain rating scale, medication(s)/side effects and non-pharmacologic comfort measures Outcome: Progressing   Problem: Pain Managment: Goal: General experience of comfort will improve and/or be controlled Outcome: Progressing   Problem: Safety: Goal: Ability to remain free from injury will improve Outcome: Progressing

## 2023-07-08 DIAGNOSIS — K859 Acute pancreatitis without necrosis or infection, unspecified: Secondary | ICD-10-CM | POA: Diagnosis not present

## 2023-07-08 DIAGNOSIS — G312 Degeneration of nervous system due to alcohol: Secondary | ICD-10-CM | POA: Diagnosis not present

## 2023-07-08 LAB — CBC
HCT: 24.5 % — ABNORMAL LOW (ref 36.0–46.0)
Hemoglobin: 8 g/dL — ABNORMAL LOW (ref 12.0–15.0)
MCH: 31.9 pg (ref 26.0–34.0)
MCHC: 32.7 g/dL (ref 30.0–36.0)
MCV: 97.6 fL (ref 80.0–100.0)
Platelets: 207 10*3/uL (ref 150–400)
RBC: 2.51 MIL/uL — ABNORMAL LOW (ref 3.87–5.11)
RDW: 16.5 % — ABNORMAL HIGH (ref 11.5–15.5)
WBC: 7.4 10*3/uL (ref 4.0–10.5)
nRBC: 0 % (ref 0.0–0.2)

## 2023-07-08 LAB — MAGNESIUM: Magnesium: 1.7 mg/dL (ref 1.7–2.4)

## 2023-07-08 MED ORDER — MAGNESIUM SULFATE 2 GM/50ML IV SOLN
2.0000 g | Freq: Once | INTRAVENOUS | Status: AC
  Administered 2023-07-08: 2 g via INTRAVENOUS
  Filled 2023-07-08: qty 50

## 2023-07-08 NOTE — TOC CM/SW Note (Signed)
    Durable Medical Equipment  (From admission, onward)           Start     Ordered   07/08/23 1132  For home use only DME standard manual wheelchair with seat cushion  Once       Comments: Patient suffers from Encephalopathy, frequent falls  which impairs their ability to perform daily activities like ambulating   in the home.  A cane  will not resolve issue with performing activities of daily living. A wheelchair will allow patient to safely perform daily activities. Patient can safely propel the wheelchair in the home or has a caregiver who can provide assistance. Length of need lifetime . Accessories: elevating leg rests (ELRs), wheel locks, extensions and anti-tippers.  Seat and back cushions   07/08/23 1132   07/08/23 0000  DME Bedside commode       Question:  Patient needs a bedside commode to treat with the following condition  Answer:  Severe muscle deconditioning   07/08/23 1032   07/08/23 0000  For home use only DME Walker rolling       Question Answer Comment  Walker: With 5 Inch Wheels   Patient needs a walker to treat with the following condition Severe muscle deconditioning      07/08/23 1032   07/08/23 0000  For home use only DME wheelchair cushion (seat and back)        07/08/23 1032

## 2023-07-08 NOTE — Discharge Summary (Addendum)
 Name: Christina Byrd MRN: [id number] DOB: [birth year] 58 y.o. PCP: Carleen Chary, DO  Date of Admission: 07/04/2023  2:18 PM Date of Discharge: 07/08/2023 07/08/2023 Attending Physician: Dr. Alwin Baars  DISCHARGE DIAGNOSIS:  Primary Problem: Encephalopathy   Hospital Problems: Principal Problem:   Encephalopathy Active Problems:   Seizure disorder (HCC)   Dysphagia   ETOH abuse   Normocytic anemia   Hypocalcemia   Hypoalbuminemia due to protein-calorie malnutrition (HCC)   Elevated lipase   Elevated liver enzymes   Falls frequently   Abdominal pain   Weakness   Metabolic acidosis   Acute pancreatitis    DISCHARGE MEDICATIONS:   Allergies as of 07/08/2023       Reactions   Losartan  Potassium    Rash   Levaquin  [levofloxacin  In D5w] Rash   Ace Inhibitors Cough   REACTION: cough   Codeine  Nausea Only   Cefepime  Rash   09/04/12 pm Christina Byrd started to break out with small macules after IV Vanco infusion, then macules increased in size after starting cefepime  infusion.   Chlorhexidine Itching, Rash   Vancomycin  Rash   09/04/12 pm Christina Byrd started to break out with small macules after IV Vanco infusion, then macules increased in size after starting cefepime  infusion.        Medication List     TAKE these medications    allopurinol  100 MG tablet Commonly known as: ZYLOPRIM  Take 2 tablets (200 mg total) by mouth daily.   atorvastatin  20 MG tablet Commonly known as: LIPITOR Take 1 tablet (20 mg total) by mouth daily.   cetirizine  10 MG tablet Commonly known as: ZyrTEC  Allergy Take 1 tablet (10 mg total) by mouth daily.   DULoxetine  30 MG capsule Commonly known as: Cymbalta  Take 1 capsule (30 mg total) by mouth daily.   ergocalciferol  1.25 MG (50000 UT) capsule Commonly known as: VITAMIN D2 Take 1 capsule by mouth once a week. Every Wednesday starting 11/13   etodolac  400 MG tablet Commonly known as: Lodine  Take 1 tablet (400 mg total) by mouth 2  (two) times daily.   feeding supplement Liqd Take 237 mLs by mouth 3 (three) times daily between meals.   fluticasone  50 MCG/ACT nasal spray Commonly known as: FLONASE  Use one spray in each nostril daily   folic acid  1 MG tablet Commonly known as: FOLVITE  Take 1 tablet (1 mg total) by mouth daily.   gabapentin  100 MG capsule Commonly known as: Neurontin  Take 1 capsule (100 mg total) by mouth 3 (three) times daily.   levETIRAcetam  750 MG tablet Commonly known as: KEPPRA  Take 1 tablet (750 mg total) by mouth 2 (two) times daily.   multivitamin with minerals Tabs tablet Take 1 tablet by mouth daily.   ondansetron  4 MG tablet Commonly known as: Zofran  Take 1 tablet (4 mg total) by mouth every 8 (eight) hours as needed for nausea or vomiting.   pantoprazole  40 MG tablet Commonly known as: Protonix  Take 1 tablet (40 mg total) by mouth 2 (two) times daily.   thiamine  100 MG tablet Commonly known as: VITAMIN B1 Take 1 tablet (100 mg total) by mouth daily.               Durable Medical Equipment  (From admission, onward)           Start     Ordered   07/08/23 1132  For home use only DME standard manual wheelchair with seat cushion  Once       Comments:  Christina Byrd suffers from Encephalopathy, frequent falls  which impairs their ability to perform daily activities like ambulating   in the home.  A cane  will not resolve issue with performing activities of daily living. A wheelchair will allow Christina Byrd to safely perform daily activities. Christina Byrd can safely propel the wheelchair in the home or has a caregiver who can provide assistance. Length of need lifetime . Accessories: elevating leg rests (ELRs), wheel locks, extensions and anti-tippers.  Seat and back cushions   07/08/23 1132   07/08/23 0000  DME Bedside commode       Question:  Christina Byrd needs a bedside commode to treat with the following condition  Answer:  Severe muscle deconditioning   07/08/23 1032   07/08/23  0000  For home use only DME Walker rolling       Question Answer Comment  Walker: With 5 Inch Wheels   Christina Byrd needs a walker to treat with the following condition Severe muscle deconditioning      07/08/23 1032   07/08/23 0000  For home use only DME wheelchair cushion (seat and back)        07/08/23 1032            DISPOSITION AND FOLLOW-UP:  Christina Byrd was discharged from [facility] in [place] condition. At the hospital follow up visit please address:  Follow-up Recommendations: Consults: None Labs: CBC and Comprehensive Metabolic Panel, consider B6.  Specimen was never received by lab during inpatient testing.  Consider repeat iron studies in the future.  Studies: None Medications: Consider naltrexone outpatient  Follow-up Appointments: May 22 at 1:45 PM in the internal medicine clinic with Dr. 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 Her hospital course was uncomplicated and is detailed below:  Encephalopathy and Weakness Ataxia Christina Byrd was admitted to the emergency room for nonspecific symptoms of tachycardia, abdominal pain, and loose stools. Her husband reported that she has been confused and somnolent for greater than 1 month. MRI brain showed chronic small vessel ischemia and multiple prior infarcts with no acute intracranial abnormality, concerning for vascular dementia. Wernicke Encephalopathy was considered given alcohol use history, encephalopathy, and ataxia, so treatment with high dose thiamine  was started. HIV and RPR were negative/non-reactive. By the time of discharge to SNF, Christina Byrd weakness was improved to baseline and she showed no signs of encephalopathy. - Completed 5 days high-dose thiamine , will  discharge with p.o. thiamine  - PT / OT outpatient - DME orders placed  Abdominal Pain Christina Byrd had intermittent abdominal pain and 1 loose bowel movement, both of which resolved during her hospitalization. ED CT abdomen showed some concern for ascending colitis and pancreatitis, but she did not meet diagnostic criteria for pancreatitis. GI pathogen panel was negative and fecal calprotectin was within normal limits.  Symptoms resolved without intervention during her hospital stay.  Alcohol Use Disorder with history of DT Hypomagnesemia Christina Byrd drinks 1/2 gallon of liquor per week (about 6 shots daily).  Has had a complicated withdrawal in the past including delirium tremens.  Differing stories from Christina Byrd and husband about her recent drinking habits.  Likely contributory. -Repeat magnesium  outpatient - Continue thiamine , folic acid , multivitamin - Monitor CMP - Continue to encourage alcohol cessation - Consider naltrexone or other therapies outpatient.   Elevated TSH  TSH  elevated on admit, free T4 normal. Would recommend continued follow-up/outside of acute illness.   Chronic Conditions Sinus Tachycardia, chronic Christina Byrd is persistently tachycardic in the hospital. Upon chart review, she also appears persistently tachycardic for months to years prior at clinic visits ranging in the 110-120s. May in part be due to anemia. 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 Urine anion gap 24, suggesting RTA. Urine Ca/Cr ratio 202 mg/g, within normal limits. - Clinical follow-up as indicated   Dysphagia, chronic Hypoalbuminemia due to protein-calorie malnutrition - Mechanical soft diet ordered - Importance of taking medications discussed - Importance of dysphagia diet discussed   Normocytic Anemia  Christina Byrd has history of blood transfusions and was found to have high iron, iron  saturations, and ferritin, perhaps consistent with iron overload vs. anemia of chronic disease. Hgb 7.7 today down from 8.4 yesterday and 8.9 on admission, likely from serial labs and rehydration.  - Continue to monitor with daily CBC - Would recommend continue monitoring of ferritin and iron studies outpatient   Seizure Disorder, stable - Continue home Keppra  dosing.   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   Gout, stable - Continue allopurinol  200mg  daily   GERD, stable - Continue PPI 40mg  BID      DISCHARGE INSTRUCTIONS:   Discharge Instructions     Ambulatory referral to Physical Therapy   Complete by: As directed    Iontophoresis - 4 mg/ml of dexamethasone : No   T.E.N.S. Unit Evaluation and Dispense as Indicated: No   Call MD for:  extreme fatigue   Complete by: As directed    Call MD for:  persistant dizziness or light-headedness   Complete by: As directed    Call MD for:  persistant nausea and vomiting   Complete by: As directed    Call MD for:  severe uncontrolled pain   Complete by: As directed    Call MD for:  temperature >100.4   Complete by: As directed    DME Bedside commode   Complete by: As directed    Christina Byrd needs a bedside commode to treat with the following condition: Severe muscle deconditioning   Discharge diet:   Complete by: As directed    Soft mechanical diet, sips with food, upright positioning while eating.   Discharge instructions   Complete by: As directed    You were hospitalized for weakness and confusion. Thank you for allowing us  to be part of your care.   You have an appointment on May 22 at 1:45 PM in the internal medicine clinic with Dr. Alexander-Savino.  I will ask them to reach out to you as well.  We have made no changes to your medications.  It would be important for you to follow-up in clinic so we may continue working on some of your chronic conditions.  Please call our clinic if you have any  questions or concerns, we may be able to help and keep you from a long and expensive emergency room wait. Our clinic and after hours phone number is [phone], the best time to call is Monday through Friday 9 am to 4 pm but there is always someone available 24/7 if you have an emergency. If you need medication refills please notify your pharmacy one week in advance and they will send us  a request.   For home use only DME  Walker rolling   Complete by: As directed    Walker: With 5 [employer]   Christina Byrd needs a walker to treat with the following condition: Severe muscle deconditioning   For home use only DME wheelchair cushion (seat and back)   Complete by: As directed    Increase activity slowly   Complete by: As directed        SUBJECTIVE:  Christina Byrd seen on rounds today Christina Byrd states that she would like to go home.  Christina Byrd states her husband and her sister will be able to help her at home, she states she is ready to go if she does not receive any bed offers.  It was recommended the Christina Byrd go to SNF.  Social work did offer the Christina Byrd to bed offers they had obtained for her, but the Christina Byrd denied both.  Will send home with home health PT, home health OT, DME orders and clinic follow-up.  Christina Byrd overall much improved from admission date.   Discharge Vitals:   BP (!) 131/100 (BP Location: Left Arm)   Pulse 92   Temp 98.2 F (36.8 C)   Resp 18   Ht 5\' 2"  (1.575 m)   Wt 68 kg   LMP 08/23/2011   SpO2 100%   BMI 27.44 kg/m   OBJECTIVE:  Constitutional: Well-appearing, in no acute distress HENT: normocephalic atraumatic, mucous membranes moist Eyes: conjunctiva non-erythematous Cardiovascular: regular rate and rhythm, no m/r/g Pulmonary/Chest: normal work of breathing on room air, lungs clear to auscultation bilaterally Abdominal: soft, non-tender, non-distended, positive bowel sounds Psych: Normal mood  Pertinent Labs, Studies, and Procedures:     Latest Ref Rng & Units  07/08/2023    3:41 AM 07/07/2023    6:37 AM 07/06/2023    9:41 AM  CBC  WBC 4.0 - 10.5 K/uL 7.4  7.3  7.3   Hemoglobin 12.0 - 15.0 g/dL 8.0  7.7  8.4   Hematocrit 36.0 - 46.0 % 24.5  23.8  27.4   Platelets 150 - 400 K/uL 207  212  216        Latest Ref Rng & Units 07/07/2023    6:37 AM 07/06/2023    9:41 AM 07/05/2023    4:57 AM  CMP  Glucose 70 - 99 mg/dL 76  88  99   BUN 6 - 20 mg/dL 7  8  7    Creatinine 0.44 - 1.00 mg/dL 1.30  8.65  7.84   Sodium 135 - 145 mmol/L 139  141  143   Potassium 3.5 - 5.1 mmol/L 3.7  3.9  3.8   Chloride 98 - 111 mmol/L 114  118  117   CO2 22 - 32 mmol/L 19  18  18    Calcium  8.9 - 10.3 mg/dL 7.8  8.0  7.9   Total Protein 6.5 - 8.1 g/dL 4.9     Total Bilirubin 0.0 - 1.2 mg/dL 0.4     Alkaline Phos 38 - 126 U/L 168     AST 15 - 41 U/L 37     ALT 0 - 44 U/L 32       MR BRAIN WO CONTRAST Result Date: 07/05/2023 CLINICAL DATA:  Altered mental status EXAM: MRI HEAD WITHOUT CONTRAST TECHNIQUE: Multiplanar, multiecho pulse sequences of the brain and surrounding structures were obtained without intravenous contrast. COMPARISON:  07/09/2016 FINDINGS: Brain: No acute infarct, mass effect or extra-axial collection. No acute or chronic hemorrhage. There is multifocal hyperintense T2-weighted signal within the white matter. Generalized  volume loss. 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 The midline structures are normal. Vascular: Normal flow voids. Skull and upper cervical spine: Normal calvarium and skull base. Visualized upper cervical spine and soft tissues are normal. Sinuses/Orbits:No paranasal sinus fluid levels or advanced mucosal thickening. No mastoid or middle ear effusion. Normal orbits. IMPRESSION: 1. No acute intracranial abnormality. 2. 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 3. Findings of chronic small vessel ischemia and volume loss. Electronically Signed   By: Juanetta Nordmann M.D.   On: 07/05/2023 03:38   US  Abdomen  Limited RUQ (LIVER/GB) Result Date: 07/04/2023 CLINICAL DATA:  Abdominal pain.  Pancreatitis. EXAM: ULTRASOUND ABDOMEN LIMITED RIGHT UPPER QUADRANT COMPARISON:  Same day abdominopelvic CT. Right upper quadrant abdominal ultrasound 12/27/2022. FINDINGS: Gallbladder: No gallstones or gallbladder wall thickening visualized. As seen on CT, there is pericholecystic fluid. Negative sonographic Murphy sign. Common bile duct: Diameter: 7 mm, mildly increased from previous ultrasound. No intrahepatic biliary dilatation. Liver: Diffusely increased hepatic echogenicity, corresponding with steatosis on CT. No focal abnormalities are identified. As seen on CT, there is perihepatic and pericholecystic fluid. Portal vein is patent on color Doppler imaging with normal direction of blood flow towards the liver. Other: Free fluid in the upper abdomen as seen on CT. Limited right upper quadrant ultrasound does not evaluate the pancreas. IMPRESSION: 1. No evidence of cholelithiasis or acute cholecystitis. 2. Mildly increased common bile duct dilatation compared with previous ultrasound, likely related to pancreatitis. No intrahepatic biliary dilatation. 3. Hepatic steatosis. 4. Ascites. Electronically Signed   By: Elmon Hagedorn M.D.   On: 07/04/2023 20:35   CT ABDOMEN PELVIS W CONTRAST Result Date: 07/04/2023 CLINICAL DATA:  Abdominal pain. EXAM: CT ABDOMEN AND PELVIS WITH CONTRAST TECHNIQUE: Multidetector CT imaging of the abdomen and pelvis was performed using the standard protocol following bolus administration of intravenous contrast. RADIATION DOSE REDUCTION: This exam was performed according to the departmental dose-optimization program which includes automated exposure control, adjustment of the mA and/or kV according to Christina Byrd size and/or use of iterative reconstruction technique. CONTRAST:  75mL OMNIPAQUE  IOHEXOL  350 MG/ML SOLN COMPARISON:  December 29, 2022 FINDINGS: Lower chest: No acute abnormality. Hepatobiliary:  There is diffuse fatty infiltration of the liver parenchyma. No focal liver abnormality is seen. No gallstones, gallbladder wall thickening, or biliary dilatation. Pancreas: A mild amount of peripancreatic inflammatory fat stranding and peripancreatic fluid is seen. There is no evidence of pancreatic ductal dilatation. Spleen: Normal in size without focal abnormality. Adrenals/Urinary Tract: There is mild diffuse enlargement the left adrenal gland. The right adrenal gland is unremarkable. Kidneys are normal, without renal calculi, focal lesion, or hydronephrosis. Bladder is unremarkable. Stomach/Bowel: Stomach is within normal limits. Appendix appears normal. No evidence of bowel dilatation. The ascending colon is mildly thickened and inflamed. Vascular/Lymphatic: Aortic atherosclerosis. No enlarged abdominal or pelvic lymph nodes. Reproductive: Uterus and bilateral adnexa are unremarkable. Other: No abdominal wall hernia or abnormality. A mild amount of abdominopelvic ascites is noted. Musculoskeletal: No acute or significant osseous findings. IMPRESSION: 1. Findings consistent with acute pancreatitis. Correlation with pancreatic enzymes is recommended. 2. Mildly thickened and inflamed ascending colon which may represent sequelae associated with acute colitis. 3. Hepatic steatosis. 4. Aortic atherosclerosis. Electronically Signed   By: Virgle Grime M.D.   On: 07/04/2023 19:05     Signed: [name] MD Internal Medicine Resident, PGY-1 [facility]  Pager: [phone] 1:23 PM, 07/08/2023

## 2023-07-08 NOTE — TOC CM/SW Note (Addendum)
 Patient wanting to go home at discharge with home health PT/OT .   Agencies NCM called:   [facility] cannot accept due to staffing.   Cory with [facility] cannot accept due to insurance.   Shelvy Dickens with [facility] cannot accept due to insurance.   Cheryl with [facility] cannot accept due to insurance   [facility] cannot accept due to insurance.   Lynette with [facility] cannot accept referral due to insurance  Amy with [facility] cannot accept referral   Kasie with [facility] cannot accept referral due to insurance.   Willetta Harpin with [facility] cannot accept referral   Brianna with [facility] cannot accept referral due to staffing   Artavia with [facility] checking to see if she can accept referral, await call back . [facility] cannot accept referral due to staffing   NCM has exhausted the home health list for patient's address . Patient aware and can go OP PT. She prefers [facility] location . Asked for orders    Patient wants cab home, and wheelchair delivered to home address.   [facility] not in network with her insurance.   Jermian with [facility] is in network and will have wheelchair shipped to her address.   Patient asking for cab voucher home . Given to nurse and patient signed waiver   Patient's husband is now at bedside and will transport her home

## 2023-07-08 NOTE — Telephone Encounter (Signed)
 Name: Sumire, Wechsler MRN: [id number]  Date: 07/14/2023 Status: Sch  Time: 1:45 PM Length: 30  Visit Type: OPEN ESTABLISHED [726] Copay: $0.00  Provider: Jose Ngo, MD      Copied from CRM [id number]. Topic: Appointments - Appointment Scheduling >> Jul 08, 2023  8:42 AM Arlie Benedict B wrote: Patient/patient representative is calling to schedule an appointment. Refer to attachments for appointment information.

## 2023-07-08 NOTE — TOC CM/SW Note (Deleted)
    Durable Medical Equipment  (From admission, onward)           Start     Ordered   07/08/23 0000  DME Bedside commode       Question:  Patient needs a bedside commode to treat with the following condition  Answer:  Severe muscle deconditioning   07/08/23 1032   07/08/23 0000  For home use only DME Walker rolling       Question Answer Comment  Walker: With 5 Inch Wheels   Patient needs a walker to treat with the following condition Severe muscle deconditioning      07/08/23 1032   07/08/23 0000  For home use only DME wheelchair cushion (seat and back)        07/08/23 1032

## 2023-07-08 NOTE — Plan of Care (Signed)
  Problem: Clinical Measurements: Goal: Ability to maintain clinical measurements within normal limits will improve Outcome: Progressing   Problem: Clinical Measurements: Goal: Will remain free from infection Outcome: Progressing   Problem: Coping: Goal: Level of anxiety will decrease Outcome: Progressing   Problem: Elimination: Goal: Will not experience complications related to bowel motility Outcome: Progressing   Problem: Elimination: Goal: Will not experience complications related to urinary retention Outcome: Progressing   Problem: Skin Integrity: Goal: Risk for impaired skin integrity will decrease Outcome: Progressing

## 2023-07-08 NOTE — TOC Progression Note (Signed)
 Transition of Care [facility]) - Progression Note    Patient Details  Name: Christina Byrd MRN: [id number] Date of Birth: [birth year]  [facility]) CM/SW Contact  Katrinka Parr, [place] Phone Number: 07/08/2023, 12:16 PM  Clinical Narrative:     Pt only has SNF bed offers at [facility] and [facility], both in [place]. CSW provided offers to pt. She states she prefers to discharge home at this time. MD and RNCM notified. She states her husband will likley provide transport home.   Expected Discharge Plan: OP Rehab Barriers to Discharge: No Barriers Identified  Expected Discharge Plan and Services         Expected Discharge Date: 07/08/23                                     Social Determinants of Health (SDOH) Interventions SDOH Screenings   Food Insecurity: No Food Insecurity (07/05/2023)  Housing: Low Risk  (07/05/2023)  Transportation Needs: No Transportation Needs (07/05/2023)  Utilities: Not At Risk (07/05/2023)  Depression (PHQ2-9): Low Risk  (06/30/2023)  Tobacco Use: Medium Risk (07/05/2023)    Readmission Risk Interventions    06/22/2023    2:16 PM 11/21/2022    1:01 PM  Readmission Risk Prevention Plan  Transportation Screening Complete Complete  Medication Review [facility]) Complete Complete  PCP or Specialist appointment within 3-5 days of discharge Complete Complete  HRI or Home Care Consult Complete Complete  SW Recovery Care/Counseling Consult Complete   Palliative Care Screening Not Applicable Not Applicable  Skilled Nursing Facility Patient Refused Not Applicable

## 2023-07-11 ENCOUNTER — Telehealth: Payer: Self-pay | Admitting: *Deleted

## 2023-07-11 ENCOUNTER — Telehealth: Payer: Self-pay

## 2023-07-11 NOTE — Telephone Encounter (Signed)
 Patient has been discharged and has an appointment scheduled for follow up 07/14/2023.  Copied from CRM [id number]. Topic: Clinical - Medical Advice >> Jul 08, 2023  8:50 AM Arlie Benedict B wrote: Reason for CRM: patient is currently in the hospital under the care of Dr. Alwin Baars. Patient states she needs someone to call her she is ready to get out of the hospital and can't tolerate being in there any longer [phone] (H)

## 2023-07-11 NOTE — Transitions of Care (Post Inpatient/ED Visit) (Signed)
   07/11/2023  Name: NA WALDRIP MRN: [id number] DOB: 04-26-1965  Today's TOC FU Call Status: Today's TOC FU Call Status:: Unsuccessful Call (1st Attempt) Unsuccessful Call (1st Attempt) Date: 07/11/23  Attempted to reach the patient regarding the most recent Inpatient/ED visit.  Follow Up Plan: Additional outreach attempts will be made to reach the patient to complete the Transitions of Care (Post Inpatient/ED visit) call.   Signature Darrall Ellison, LPN [facility] Nurse Health Advisor Direct Dial [phone]

## 2023-07-12 NOTE — Transitions of Care (Post Inpatient/ED Visit) (Signed)
   07/12/2023  Name: Christina Byrd MRN: [id number] DOB: 12/21/1965  Today's TOC FU Call Status: Today's TOC FU Call Status:: Unsuccessful Call (2nd Attempt) Unsuccessful Call (1st Attempt) Date: 07/11/23 Unsuccessful Call (2nd Attempt) Date: 07/12/23  Attempted to reach the patient regarding the most recent Inpatient/ED visit.  Follow Up Plan: Additional outreach attempts will be made to reach the patient to complete the Transitions of Care (Post Inpatient/ED visit) call.   Signature Darrall Ellison, LPN [facility] Nurse Health Advisor Direct Dial [phone]

## 2023-07-13 ENCOUNTER — Ambulatory Visit: Payer: Self-pay

## 2023-07-13 NOTE — Telephone Encounter (Addendum)
 Summary: Medication (OTC)   Copied From CRM [id number]. Pt [place] had question regarding the OTC Sarahann Cumins and can she take this with her other medications      07/13/23 1507: 1st attempt, call could not be completed as dialed  07/13/23 1533- 2nd attempt, "call could not be completed as dialed" message rec'd. Will go ahead and route to office for follow-up

## 2023-07-13 NOTE — Transitions of Care (Post Inpatient/ED Visit) (Signed)
   07/13/2023  Name: MORRISON MASSER MRN: [id number] DOB: 1965-09-11  Today's TOC FU Call Status: Today's TOC FU Call Status:: Unsuccessful Call (3rd Attempt) Unsuccessful Call (1st Attempt) Date: 07/11/23 Unsuccessful Call (2nd Attempt) Date: 07/12/23 Unsuccessful Call (3rd Attempt) Date: 07/13/23  Attempted to reach the patient regarding the most recent Inpatient/ED visit.  Follow Up Plan: No further outreach attempts will be made at this time. We have been unable to contact the patient.  Signature Darrall Ellison, LPN [facility] Nurse Health Advisor Direct Dial [phone]

## 2023-07-14 ENCOUNTER — Ambulatory Visit (INDEPENDENT_AMBULATORY_CARE_PROVIDER_SITE_OTHER): Admitting: Student

## 2023-07-14 ENCOUNTER — Other Ambulatory Visit: Payer: Self-pay

## 2023-07-14 ENCOUNTER — Encounter: Payer: Self-pay | Admitting: Student

## 2023-07-14 VITALS — BP 115/84 | HR 111 | Temp 97.9°F | Ht 62.0 in | Wt 104.1 lb

## 2023-07-14 DIAGNOSIS — E039 Hypothyroidism, unspecified: Secondary | ICD-10-CM

## 2023-07-14 DIAGNOSIS — E512 Wernicke's encephalopathy: Secondary | ICD-10-CM | POA: Diagnosis not present

## 2023-07-14 DIAGNOSIS — G629 Polyneuropathy, unspecified: Secondary | ICD-10-CM

## 2023-07-14 DIAGNOSIS — F1010 Alcohol abuse, uncomplicated: Secondary | ICD-10-CM

## 2023-07-14 MED ORDER — NALTREXONE HCL 50 MG PO TABS: 50.0000 mg | ORAL_TABLET | Freq: Every day | ORAL | 0 refills | Status: DC

## 2023-07-14 MED ORDER — THIAMINE HCL 100 MG PO TABS: 100.0000 mg | ORAL_TABLET | Freq: Every day | ORAL | 3 refills | Status: DC

## 2023-07-14 NOTE — Patient Instructions (Signed)
 Thank you, [name] for allowing us  to provide your care today.   I have ordered the following labs for you:   Lab Orders         CMP14 + Anion Gap         TSH        Start the following medications: Meds ordered this encounter  Medications   naltrexone (DEPADE) 50 MG tablet    Sig: Take 1 tablet (50 mg total) by mouth daily.    Dispense:  90 tablet    Refill:  0   thiamine  (VITAMIN B1) 100 MG tablet    Sig: Take 1 tablet (100 mg total) by mouth daily.    Dispense:  90 tablet    Refill:  3     Follow up: 1 month    Remember:   Start taking Naltrexone to help you with your alcohol use so you continue being OFF alcohol. CONGRATULATIONS ON STOPPING   Talk to [facility] on resources to stay off alcohol   Continue taking B1   The medication for your tingling in the hands is called Cymbalta  - ask the pharmacy for your refill.   Should you have any questions or concerns please call the internal medicine clinic at [phone]     Jose Ngo, MD [facility]

## 2023-07-14 NOTE — Progress Notes (Signed)
 CC:  Chief Complaint  Patient presents with   Transitions Of Care    HFU   Follow-up    Patient is here for follow up / stating she has lost more weight/ not sure about refill on medication   HPI:  [name] is a 58 y.o. female living with a history stated below and presents today for the above. Please see problem based assessment and plan for additional details.  Past Medical History:  Diagnosis Date   GASTROESOPHAGEAL REFLUX, NO ESOPHAGITIS 04/21/2006   Qualifier: Diagnosis of  By: WATT, JOANNE     GERD (gastroesophageal reflux disease) Dx 1995   Hot flash, menopausal 06/22/2016   Hot flashes 01/30/2020   HTN (hypertension)    Hypotension 10/21/2022   Patient's blood pressure at presentation was 87/65, follow up blood pressure was 81/58. Patient reports regurgitating everything she has taken po for the past month. Denies lightheadedness or dizziness. Discussed GI referral, though patient stated she would be going to the emergency room today for IV fluids.   -GI referral for upper endoscopy placed  -Patient planning on going to the ED for IV flu   Intertrigo 06/22/2016   Nausea & vomiting 12/23/2022   Nausea and vomiting 12/24/2022   Pain and swelling of left ankle 12/02/2014   Seizures (HCC)    Tendinitis of right hip flexor 07/23/2014   Tendonitis, Achilles, right 04/01/2014    Current Outpatient Medications on File Prior to Visit  Medication Sig Dispense Refill   allopurinol  (ZYLOPRIM ) 100 MG tablet Take 2 tablets (200 mg total) by mouth daily. 60 tablet 3   atorvastatin  (LIPITOR) 20 MG tablet Take 1 tablet (20 mg total) by mouth daily. 90 tablet 3   cetirizine  (ZYRTEC  ALLERGY) 10 MG tablet Take 1 tablet (10 mg total) by mouth daily. 30 tablet 2   DULoxetine  (CYMBALTA ) 30 MG capsule Take 1 capsule (30 mg total) by mouth daily. 30 capsule 2   ergocalciferol  (VITAMIN D2) 1.25 MG (50000 UT) capsule Take 1 capsule by mouth once a week. Every Wednesday starting  11/13 (Patient not taking: Reported on 07/04/2023) 7 capsule 0   etodolac  (LODINE ) 400 MG tablet Take 1 tablet (400 mg total) by mouth 2 (two) times daily. 20 tablet 0   feeding supplement (ENSURE ENLIVE / ENSURE PLUS) LIQD Take 237 mLs by mouth 3 (three) times daily between meals. (Patient not taking: Reported on 07/04/2023) 237 mL 12   fluticasone  (FLONASE ) 50 MCG/ACT nasal spray Use one spray in each nostril daily (Patient not taking: Reported on 07/04/2023) 9.9 mL 2   folic acid  (FOLVITE ) 1 MG tablet Take 1 tablet (1 mg total) by mouth daily. 30 tablet 2   gabapentin  (NEURONTIN ) 100 MG capsule Take 1 capsule (100 mg total) by mouth 3 (three) times daily. 90 capsule 2   levETIRAcetam  (KEPPRA ) 750 MG tablet Take 1 tablet (750 mg total) by mouth 2 (two) times daily. 60 tablet 2   Multiple Vitamin (MULTIVITAMIN WITH MINERALS) TABS tablet Take 1 tablet by mouth daily. 30 tablet 0   ondansetron  (ZOFRAN ) 4 MG tablet Take 1 tablet (4 mg total) by mouth every 8 (eight) hours as needed for nausea or vomiting. 20 tablet 0   pantoprazole  (PROTONIX ) 40 MG tablet Take 1 tablet (40 mg total) by mouth 2 (two) times daily. 90 tablet 3   [DISCONTINUED] metFORMIN  (GLUCOPHAGE ) 500 MG tablet Take 0.5 tablets (250 mg total) by mouth daily with breakfast. 45 tablet 6   No  current facility-administered medications on file prior to visit.    Family History  Problem Relation Age of Onset   CAD Mother     Social History   Socioeconomic History   Marital status: Single    Spouse name: Not on file   Number of children: Not on file   Years of education: Not on file   Highest education level: Not on file  Occupational History   Not on file  Tobacco Use   Smoking status: Former    Current packs/day: 0.00    Types: Cigarettes    Quit date: 06/25/2012    Years since quitting: 11.0   Smokeless tobacco: Never  Vaping Use   Vaping status: Never Used  Substance and Sexual Activity   Alcohol use: Not Currently     Alcohol/week: 49.0 standard drinks of alcohol    Types: 14 Cans of beer, 35 Shots of liquor per week   Drug use: No   Sexual activity: Not Currently  Other Topics Concern   Not on file  Social History Narrative   Pt lives in single story home with her partner   Has 1 son   5 grandchildren   10th grade education   Works at [employer].    Right handed   Social Drivers of Health   Financial Resource Strain: Not on file  Food Insecurity: No Food Insecurity (07/05/2023)   Hunger Vital Sign    Worried About Running Out of Food in the Last Year: Never true    Ran Out of Food in the Last Year: Never true  Transportation Needs: No Transportation Needs (07/05/2023)   PRAPARE - Administrator, Civil Service (Medical): No    Lack of Transportation (Non-Medical): No  Physical Activity: Not on file  Stress: Not on file  Social Connections: Not on file  Intimate Partner Violence: Not At Risk (07/05/2023)   Humiliation, Afraid, Rape, and Kick questionnaire    Fear of Current or Ex-Partner: No    Emotionally Abused: No    Physically Abused: No    Sexually Abused: No    Review of Systems: ROS negative except for what is noted on the assessment and plan.  Vitals:   07/14/23 1402  BP: 115/84  Pulse: (!) 111  Temp: 97.9 F (36.6 C)  TempSrc: Oral  SpO2: 97%  Weight: 104 lb 1.6 oz (47.2 kg)  Height: 5\' 2"  (1.575 m)    Physical Exam: Constitutional: well-appearing,  sitting in her wheelchair, in no acute distress HENT: normocephalic atraumatic, mucous membranes moist Eyes: conjunctiva non-erythematous, non-jaundiced Cardiovascular: regular rate and rhythm, no m/r/g, no BLE  Pulmonary/Chest: normal work of breathing on room air, lungs clear to auscultation bilaterally Abdominal: soft, non-tender, non-distended MSK: normal bulk and tone Neurological: alert & oriented x 3, no focal deficit Skin: warm and dry Psych: anxious  Assessment & Plan:   Patient discussed with  Dr. Jarvis Mesa  Hypothyroidism Has had TSH elevated in the past and needs further work-up to determine etiology. Given abdominal pain and loose stools in the past and hx of hyperlipidemia will check TSH.   ETOH abuse Patient was recently admitted for non-specific symptoms of abdominal pain from 5/12-5/15. There was concern for pancreatitis although it did not meet 3x ULN threshold for pancreatitis. This abdominal pain self resolved during her hospitalization. However, she was encephalopathic and ataxia. This was concerning for Wernicke's. She completed five days of high dose thiamine  and was discharged on po thiamine . She  was adamant during the visit that she had quit alcohol because of her grandkids. Repeated to me about 5x that she had cold [place] stopped drinking. She does crave alcohol and willing to start naltrexone, she is also willing to see bianca for resources on alcohol use. She has been taking thiamine  at home. No ataxia or nystagmus on exam. Unfortuantely after she went to get her blood draw, her husband let me know that she has not quit alcohol and she continues to drink. Please see Wernicke's Encephalopathy problem.  -Start Naltrexone 50mg  daily  -Referral to [facility] for resources to help be off alcohol.  Wernicke encephalopathy Patient drinks 1/2 gallon of liquor per week (about 6 shots daily). Patient also has a history of dysphagia, N/V that has caused significant weight loss in the last year. She unfortunately came in confused and with ataxia during her most recent hospitalization. Pt will confabulate, and thus caution should be used when gathering a story. She will sound very much convincing, like today, when she was adamant that she had quit drinking alcohol for her grandchildren. Per husband, she also makes up stories at home all the time. Collateral should be obtained when gathering a history. I am afraid at this point she may be at the [place] side of the  spectrum. Husband is having a hard time keeping up with her. She will have an unstable gait at times at home and has already fallen. She has not heard from OT or PT. She is also not using her rollator. Home will need to be assessed to safety, and it would be beneficial for someone to help her at home with her day to day as she is unable to take care of herself at this point, exhausting her husband. In the visit alone, she called for him about 5 times, and per husband, she will call for him every 15 minuntes at night. He is unable to keep up at this rate.    -Home health order sent for PT, OT, and aids with medications as memory for this patient is unreliable, especially if confabulating. -Cw Thiamine  100mg  po  -CMP  -TSH  Jose Ngo, MD [facility] Internal Medicine, PGY-1 Phone: [phone] Date 07/16/2023 Time 3:53 PM

## 2023-07-15 ENCOUNTER — Ambulatory Visit: Payer: Self-pay | Admitting: Student

## 2023-07-15 LAB — CMP14 + ANION GAP
ALT: 81 IU/L — ABNORMAL HIGH (ref 0–32)
AST: 233 IU/L — ABNORMAL HIGH (ref 0–40)
Albumin: 2.7 g/dL — ABNORMAL LOW (ref 3.8–4.9)
Alkaline Phosphatase: 231 IU/L — ABNORMAL HIGH (ref 44–121)
Anion Gap: 15 mmol/L (ref 10.0–18.0)
BUN/Creatinine Ratio: 12 (ref 9–23)
BUN: 9 mg/dL (ref 6–24)
Bilirubin Total: 0.3 mg/dL (ref 0.0–1.2)
CO2: 15 mmol/L — ABNORMAL LOW (ref 20–29)
Calcium: 8.1 mg/dL — ABNORMAL LOW (ref 8.7–10.2)
Chloride: 111 mmol/L — ABNORMAL HIGH (ref 96–106)
Creatinine, Ser: 0.76 mg/dL (ref 0.57–1.00)
Globulin, Total: 2.9 g/dL (ref 1.5–4.5)
Glucose: 127 mg/dL — ABNORMAL HIGH (ref 70–99)
Potassium: 3.3 mmol/L — ABNORMAL LOW (ref 3.5–5.2)
Sodium: 141 mmol/L (ref 134–144)
Total Protein: 5.6 g/dL — ABNORMAL LOW (ref 6.0–8.5)
eGFR: 91 mL/min/{1.73_m2} (ref >59)

## 2023-07-15 LAB — TSH: TSH: 12.9 u[IU]/mL — ABNORMAL HIGH (ref 0.450–4.500)

## 2023-07-16 DIAGNOSIS — E512 Wernicke's encephalopathy: Secondary | ICD-10-CM | POA: Insufficient documentation

## 2023-07-16 NOTE — Assessment & Plan Note (Signed)
 Has had TSH elevated in the past and needs further work-up to determine etiology. Given abdominal pain and loose stools in the past and hx of hyperlipidemia will check TSH.

## 2023-07-16 NOTE — Assessment & Plan Note (Signed)
 Patient was recently admitted for non-specific symptoms of abdominal pain from 5/12-5/15. There was concern for pancreatitis although it did not meet 3x ULN threshold for pancreatitis. This abdominal pain self resolved during her hospitalization. However, she was encephalopathic and ataxia. This was concerning for Wernicke's. She completed five days of high dose thiamine  and was discharged on po thiamine . She was adamant during the visit that she had quit alcohol because of her grandkids. Repeated to me about 5x that she had cold [place] stopped drinking. She does crave alcohol and willing to start naltrexone, she is also willing to see bianca for resources on alcohol use. She has been taking thiamine  at home. No ataxia or nystagmus on exam. Unfortuantely after she went to get her blood draw, her husband let me know that she has not quit alcohol and she continues to drink. Please see Wernicke's Encephalopathy problem.  -Start Naltrexone 50mg  daily  -Referral to [facility] for resources to help be off alcohol.

## 2023-07-16 NOTE — Assessment & Plan Note (Addendum)
 Patient drinks 1/2 gallon of liquor per week (about 6 shots daily). Patient also has a history of dysphagia, N/V that has caused significant weight loss in the last year. She unfortunately came in confused and with ataxia during her most recent hospitalization. Pt will confabulate, and thus caution should be used when gathering a story. She will sound very much convincing, like today, when she was adamant that she had quit drinking alcohol for her grandchildren. Per husband, she also makes up stories at home all the time. Collateral should be obtained when gathering a history. I am afraid at this point she may be at the [place]off side of the spectrum. Husband is having a hard time keeping up with her. She will have an unstable gait at times at home and has already fallen. She has not heard from OT or PT. She is also not using her rollator. Home will need to be assessed to safety, and it would be beneficial for someone to help her at home with her day to day as she is unable to take care of herself at this point, exhausting her husband. In the visit alone, she called for him about 5 times, and per husband, she will call for him every 15 minuntes at night. He is unable to keep up at this rate.    -Home health order sent for PT, OT, and aids with medications as memory for this patient is unreliable, especially if confabulating. -Cw Thiamine  100mg  po  -CMP  -TSH

## 2023-07-19 ENCOUNTER — Encounter (HOSPITAL_COMMUNITY): Payer: Self-pay

## 2023-07-19 ENCOUNTER — Other Ambulatory Visit: Payer: Self-pay

## 2023-07-19 ENCOUNTER — Inpatient Hospital Stay (HOSPITAL_COMMUNITY)
Admission: EM | Admit: 2023-07-19 | Discharge: 2023-07-27 | DRG: 640 | Disposition: A | Discharge status: Skilled Nursing Facility | Attending: Internal Medicine | Admitting: Internal Medicine

## 2023-07-19 DIAGNOSIS — G9341 Metabolic encephalopathy: Secondary | ICD-10-CM | POA: Diagnosis present

## 2023-07-19 DIAGNOSIS — K709 Alcoholic liver disease, unspecified: Secondary | ICD-10-CM | POA: Diagnosis present

## 2023-07-19 DIAGNOSIS — G4700 Insomnia, unspecified: Secondary | ICD-10-CM | POA: Diagnosis present

## 2023-07-19 DIAGNOSIS — Z87891 Personal history of nicotine dependence: Secondary | ICD-10-CM

## 2023-07-19 DIAGNOSIS — R339 Retention of urine, unspecified: Secondary | ICD-10-CM | POA: Diagnosis present

## 2023-07-19 DIAGNOSIS — E872 Acidosis, unspecified: Principal | ICD-10-CM

## 2023-07-19 DIAGNOSIS — D6489 Other specified anemias: Secondary | ICD-10-CM | POA: Diagnosis present

## 2023-07-19 DIAGNOSIS — R112 Nausea with vomiting, unspecified: Secondary | ICD-10-CM

## 2023-07-19 DIAGNOSIS — Z8249 Family history of ischemic heart disease and other diseases of the circulatory system: Secondary | ICD-10-CM

## 2023-07-19 DIAGNOSIS — N179 Acute kidney failure, unspecified: Secondary | ICD-10-CM | POA: Diagnosis present

## 2023-07-19 DIAGNOSIS — E8729 Other acidosis: Principal | ICD-10-CM | POA: Diagnosis present

## 2023-07-19 DIAGNOSIS — E860 Dehydration: Secondary | ICD-10-CM | POA: Diagnosis not present

## 2023-07-19 DIAGNOSIS — E861 Hypovolemia: Secondary | ICD-10-CM | POA: Diagnosis present

## 2023-07-19 DIAGNOSIS — I5032 Chronic diastolic (congestive) heart failure: Secondary | ICD-10-CM | POA: Diagnosis present

## 2023-07-19 DIAGNOSIS — K219 Gastro-esophageal reflux disease without esophagitis: Secondary | ICD-10-CM | POA: Diagnosis present

## 2023-07-19 DIAGNOSIS — M109 Gout, unspecified: Secondary | ICD-10-CM | POA: Diagnosis present

## 2023-07-19 DIAGNOSIS — Z7984 Long term (current) use of oral hypoglycemic drugs: Secondary | ICD-10-CM

## 2023-07-19 DIAGNOSIS — G629 Polyneuropathy, unspecified: Secondary | ICD-10-CM | POA: Diagnosis present

## 2023-07-19 DIAGNOSIS — Z885 Allergy status to narcotic agent status: Secondary | ICD-10-CM

## 2023-07-19 DIAGNOSIS — Z881 Allergy status to other antibiotic agents status: Secondary | ICD-10-CM

## 2023-07-19 DIAGNOSIS — E039 Hypothyroidism, unspecified: Secondary | ICD-10-CM | POA: Diagnosis present

## 2023-07-19 DIAGNOSIS — F10930 Alcohol use, unspecified with withdrawal, uncomplicated: Secondary | ICD-10-CM

## 2023-07-19 DIAGNOSIS — E876 Hypokalemia: Secondary | ICD-10-CM | POA: Diagnosis present

## 2023-07-19 DIAGNOSIS — G40909 Epilepsy, unspecified, not intractable, without status epilepticus: Secondary | ICD-10-CM | POA: Diagnosis present

## 2023-07-19 DIAGNOSIS — I110 Hypertensive heart disease with heart failure: Secondary | ICD-10-CM | POA: Diagnosis present

## 2023-07-19 DIAGNOSIS — F10239 Alcohol dependence with withdrawal, unspecified: Secondary | ICD-10-CM | POA: Diagnosis not present

## 2023-07-19 DIAGNOSIS — E559 Vitamin D deficiency, unspecified: Secondary | ICD-10-CM | POA: Diagnosis present

## 2023-07-19 DIAGNOSIS — Z888 Allergy status to other drugs, medicaments and biological substances status: Secondary | ICD-10-CM

## 2023-07-19 DIAGNOSIS — F1010 Alcohol abuse, uncomplicated: Secondary | ICD-10-CM

## 2023-07-19 DIAGNOSIS — F10139 Alcohol abuse with withdrawal, unspecified: Secondary | ICD-10-CM | POA: Diagnosis present

## 2023-07-19 DIAGNOSIS — F1090 Alcohol use, unspecified, uncomplicated: Secondary | ICD-10-CM

## 2023-07-19 DIAGNOSIS — Z79899 Other long term (current) drug therapy: Secondary | ICD-10-CM

## 2023-07-19 DIAGNOSIS — L03116 Cellulitis of left lower limb: Secondary | ICD-10-CM | POA: Diagnosis present

## 2023-07-19 LAB — COMPREHENSIVE METABOLIC PANEL WITH GFR
ALT: 51 U/L — ABNORMAL HIGH (ref 0–44)
AST: 73 U/L — ABNORMAL HIGH (ref 15–41)
Albumin: 2.6 g/dL — ABNORMAL LOW (ref 3.5–5.0)
Alkaline Phosphatase: 194 U/L — ABNORMAL HIGH (ref 38–126)
Anion gap: 18 — ABNORMAL HIGH (ref 5–15)
BUN: 8 mg/dL (ref 6–20)
CO2: 10 mmol/L — ABNORMAL LOW (ref 22–32)
Calcium: 7.6 mg/dL — ABNORMAL LOW (ref 8.9–10.3)
Chloride: 117 mmol/L — ABNORMAL HIGH (ref 98–111)
Creatinine, Ser: 1.03 mg/dL — ABNORMAL HIGH (ref 0.44–1.00)
GFR, Estimated: >60 mL/min (ref >60)
Glucose, Bld: 75 mg/dL (ref 70–99)
Potassium: 3.8 mmol/L (ref 3.5–5.1)
Sodium: 145 mmol/L (ref 135–145)
Total Bilirubin: 0.8 mg/dL (ref 0.0–1.2)
Total Protein: 6.3 g/dL — ABNORMAL LOW (ref 6.5–8.1)

## 2023-07-19 LAB — CBC
HCT: 31.9 % — ABNORMAL LOW (ref 36.0–46.0)
Hemoglobin: 9.9 g/dL — ABNORMAL LOW (ref 12.0–15.0)
MCH: 32.1 pg (ref 26.0–34.0)
MCHC: 31 g/dL (ref 30.0–36.0)
MCV: 103.6 fL — ABNORMAL HIGH (ref 80.0–100.0)
Platelets: 244 10*3/uL (ref 150–400)
RBC: 3.08 MIL/uL — ABNORMAL LOW (ref 3.87–5.11)
RDW: 17.3 % — ABNORMAL HIGH (ref 11.5–15.5)
WBC: 6.2 10*3/uL (ref 4.0–10.5)
nRBC: 0 % (ref 0.0–0.2)

## 2023-07-19 LAB — ETHANOL: Alcohol, Ethyl (B): 117 mg/dL — ABNORMAL HIGH (ref <15)

## 2023-07-19 NOTE — ED Triage Notes (Addendum)
 Pt reports tremors to bilateral hands, skin is peeling on some of her fingers and she also reports bilateral foot pain. She also reports nausea and vomiting which started tonight upon arrival to ED.  She reports she drinks a bottle of beer and 3 shots a day. Last drank last night.

## 2023-07-20 ENCOUNTER — Emergency Department (HOSPITAL_COMMUNITY)

## 2023-07-20 ENCOUNTER — Observation Stay (HOSPITAL_COMMUNITY)

## 2023-07-20 ENCOUNTER — Other Ambulatory Visit: Payer: Self-pay

## 2023-07-20 DIAGNOSIS — E8729 Other acidosis: Secondary | ICD-10-CM | POA: Diagnosis present

## 2023-07-20 DIAGNOSIS — K709 Alcoholic liver disease, unspecified: Secondary | ICD-10-CM | POA: Diagnosis not present

## 2023-07-20 DIAGNOSIS — N179 Acute kidney failure, unspecified: Secondary | ICD-10-CM | POA: Diagnosis not present

## 2023-07-20 DIAGNOSIS — D259 Leiomyoma of uterus, unspecified: Secondary | ICD-10-CM | POA: Diagnosis not present

## 2023-07-20 DIAGNOSIS — E860 Dehydration: Secondary | ICD-10-CM | POA: Diagnosis not present

## 2023-07-20 DIAGNOSIS — I5032 Chronic diastolic (congestive) heart failure: Secondary | ICD-10-CM | POA: Diagnosis not present

## 2023-07-20 DIAGNOSIS — K439 Ventral hernia without obstruction or gangrene: Secondary | ICD-10-CM | POA: Diagnosis not present

## 2023-07-20 DIAGNOSIS — F10139 Alcohol abuse with withdrawal, unspecified: Secondary | ICD-10-CM | POA: Diagnosis not present

## 2023-07-20 DIAGNOSIS — I110 Hypertensive heart disease with heart failure: Secondary | ICD-10-CM | POA: Diagnosis not present

## 2023-07-20 DIAGNOSIS — L03116 Cellulitis of left lower limb: Secondary | ICD-10-CM | POA: Diagnosis not present

## 2023-07-20 DIAGNOSIS — G40909 Epilepsy, unspecified, not intractable, without status epilepticus: Secondary | ICD-10-CM | POA: Diagnosis not present

## 2023-07-20 DIAGNOSIS — K8020 Calculus of gallbladder without cholecystitis without obstruction: Secondary | ICD-10-CM | POA: Diagnosis not present

## 2023-07-20 DIAGNOSIS — G9341 Metabolic encephalopathy: Secondary | ICD-10-CM | POA: Diagnosis not present

## 2023-07-20 DIAGNOSIS — E039 Hypothyroidism, unspecified: Secondary | ICD-10-CM | POA: Diagnosis not present

## 2023-07-20 DIAGNOSIS — R112 Nausea with vomiting, unspecified: Secondary | ICD-10-CM | POA: Diagnosis not present

## 2023-07-20 LAB — RAPID URINE DRUG SCREEN, HOSP PERFORMED
Amphetamines: NOT DETECTED
Barbiturates: NOT DETECTED
Benzodiazepines: NOT DETECTED
Cocaine: NOT DETECTED
Opiates: NOT DETECTED
Tetrahydrocannabinol: NOT DETECTED

## 2023-07-20 LAB — BASIC METABOLIC PANEL WITH GFR
Anion gap: 11 (ref 5–15)
BUN: 6 mg/dL (ref 6–20)
CO2: 18 mmol/L — ABNORMAL LOW (ref 22–32)
Calcium: 7.2 mg/dL — ABNORMAL LOW (ref 8.9–10.3)
Chloride: 109 mmol/L (ref 98–111)
Creatinine, Ser: 1.05 mg/dL — ABNORMAL HIGH (ref 0.44–1.00)
GFR, Estimated: >60 mL/min (ref >60)
Glucose, Bld: 97 mg/dL (ref 70–99)
Potassium: 4.1 mmol/L (ref 3.5–5.1)
Sodium: 138 mmol/L (ref 135–145)

## 2023-07-20 LAB — I-STAT VENOUS BLOOD GAS, ED
Acid-base deficit: 7 mmol/L — ABNORMAL HIGH (ref 0.0–2.0)
Acid-base deficit: 5 mmol/L — ABNORMAL HIGH (ref 0.0–2.0)
Bicarbonate: 16.4 mmol/L — ABNORMAL LOW (ref 20.0–28.0)
Bicarbonate: 18.6 mmol/L — ABNORMAL LOW (ref 20.0–28.0)
Calcium, Ion: 1 mmol/L — ABNORMAL LOW (ref 1.15–1.40)
Calcium, Ion: 0.99 mmol/L — ABNORMAL LOW (ref 1.15–1.40)
HCT: 28 % — ABNORMAL LOW (ref 36.0–46.0)
HCT: 28 % — ABNORMAL LOW (ref 36.0–46.0)
Hemoglobin: 9.5 g/dL — ABNORMAL LOW (ref 12.0–15.0)
Hemoglobin: 9.5 g/dL — ABNORMAL LOW (ref 12.0–15.0)
O2 Saturation: 100 %
O2 Saturation: 99 %
Potassium: 3.8 mmol/L (ref 3.5–5.1)
Potassium: 3.8 mmol/L (ref 3.5–5.1)
Sodium: 143 mmol/L (ref 135–145)
Sodium: 139 mmol/L (ref 135–145)
TCO2: 17 mmol/L — ABNORMAL LOW (ref 22–32)
TCO2: 19 mmol/L — ABNORMAL LOW (ref 22–32)
pCO2, Ven: 25.2 mmHg — ABNORMAL LOW (ref 44–60)
pCO2, Ven: 29 mmHg — ABNORMAL LOW (ref 44–60)
pH, Ven: 7.42 (ref 7.25–7.43)
pH, Ven: 7.415 (ref 7.25–7.43)
pO2, Ven: 172 mmHg — ABNORMAL HIGH (ref 32–45)
pO2, Ven: 145 mmHg — ABNORMAL HIGH (ref 32–45)

## 2023-07-20 LAB — URINALYSIS, ROUTINE W REFLEX MICROSCOPIC
Bilirubin Urine: NEGATIVE
Glucose, UA: NEGATIVE mg/dL
Hgb urine dipstick: NEGATIVE
Ketones, ur: NEGATIVE mg/dL
Leukocytes,Ua: NEGATIVE
Nitrite: NEGATIVE
Protein, ur: NEGATIVE mg/dL
Specific Gravity, Urine: 1.019 (ref 1.005–1.030)
pH: 5 (ref 5.0–8.0)

## 2023-07-20 LAB — I-STAT CG4 LACTIC ACID, ED
Lactic Acid, Venous: 4.3 mmol/L (ref 0.5–1.9)
Lactic Acid, Venous: 5 mmol/L (ref 0.5–1.9)
Lactic Acid, Venous: 4.9 mmol/L (ref 0.5–1.9)
Lactic Acid, Venous: 5 mmol/L (ref 0.5–1.9)
Lactic Acid, Venous: 5.8 mmol/L (ref 0.5–1.9)
Lactic Acid, Venous: 4.4 mmol/L (ref 0.5–1.9)

## 2023-07-20 LAB — BLOOD GAS, VENOUS
Acid-base deficit: 2.9 mmol/L — ABNORMAL HIGH (ref 0.0–2.0)
Bicarbonate: 20.9 mmol/L (ref 20.0–28.0)
Drawn by: 8344
O2 Saturation: 92.7 %
Patient temperature: 36.4
pCO2, Ven: 32 mmHg — ABNORMAL LOW (ref 44–60)
pH, Ven: 7.42 (ref 7.25–7.43)
pO2, Ven: 57 mmHg — ABNORMAL HIGH (ref 32–45)

## 2023-07-20 LAB — VOLATILES,BLD-ACETONE,ETHANOL,ISOPROP,METHANOL
Acetone, blood: <0.01 g/dL (ref 0.000–0.010)
Ethanol, blood: <0.01 g/dL (ref 0.000–0.010)
Isopropanol, blood: <0.01 g/dL (ref 0.000–0.010)
Methanol, blood: <0.01 g/dL (ref 0.000–0.010)

## 2023-07-20 LAB — CBG MONITORING, ED
Glucose-Capillary: 81 mg/dL (ref 70–99)
Glucose-Capillary: 127 mg/dL — ABNORMAL HIGH (ref 70–99)

## 2023-07-20 LAB — TSH: TSH: 47.997 u[IU]/mL — ABNORMAL HIGH (ref 0.350–4.500)

## 2023-07-20 LAB — LACTIC ACID, PLASMA
Lactic Acid, Venous: 3.3 mmol/L (ref 0.5–1.9)
Lactic Acid, Venous: 2.1 mmol/L (ref 0.5–1.9)

## 2023-07-20 LAB — LIPASE, BLOOD: Lipase: 86 U/L — ABNORMAL HIGH (ref 11–51)

## 2023-07-20 LAB — GLUCOSE, CAPILLARY: Glucose-Capillary: 143 mg/dL — ABNORMAL HIGH (ref 70–99)

## 2023-07-20 LAB — AMMONIA: Ammonia: 62 umol/L — ABNORMAL HIGH (ref 9–35)

## 2023-07-20 LAB — BETA-HYDROXYBUTYRIC ACID: Beta-Hydroxybutyric Acid: 0.73 mmol/L — ABNORMAL HIGH (ref 0.05–0.27)

## 2023-07-20 MED ORDER — LORAZEPAM 2 MG/ML IJ SOLN
1.0000 mg | INTRAMUSCULAR | Status: DC | PRN
  Administered 2023-07-20: 2 mg via INTRAVENOUS
  Filled 2023-07-20: qty 1

## 2023-07-20 MED ORDER — ADULT MULTIVITAMIN W/MINERALS CH: 1.0000 | ORAL_TABLET | Freq: Every day | ORAL | Status: DC

## 2023-07-20 MED ORDER — GABAPENTIN 100 MG PO CAPS
100.0000 mg | ORAL_CAPSULE | Freq: Three times a day (TID) | ORAL | Status: DC
  Administered 2023-07-20 – 2023-07-27 (×21): 100 mg via ORAL
  Filled 2023-07-20 (×22): qty 1

## 2023-07-20 MED ORDER — DEXTROSE 50 % IV SOLN
1.0000 | Freq: Once | INTRAVENOUS | Status: AC
  Administered 2023-07-20: 50 mL via INTRAVENOUS
  Filled 2023-07-20: qty 50

## 2023-07-20 MED ORDER — DULOXETINE HCL 30 MG PO CPEP
30.0000 mg | ORAL_CAPSULE | Freq: Every day | ORAL | Status: DC
  Administered 2023-07-20 – 2023-07-27 (×8): 30 mg via ORAL
  Filled 2023-07-20 (×8): qty 1

## 2023-07-20 MED ORDER — ENOXAPARIN SODIUM 40 MG/0.4ML IJ SOSY
40.0000 mg | PREFILLED_SYRINGE | INTRAMUSCULAR | Status: DC
  Administered 2023-07-20 – 2023-07-21 (×2): 40 mg via SUBCUTANEOUS
  Filled 2023-07-20 (×2): qty 0.4

## 2023-07-20 MED ORDER — PIPERACILLIN-TAZOBACTAM 3.375 G IVPB 30 MIN
3.3750 g | Freq: Once | INTRAVENOUS | Status: AC
  Administered 2023-07-20: 3.375 g via INTRAVENOUS
  Filled 2023-07-20: qty 50

## 2023-07-20 MED ORDER — ATORVASTATIN CALCIUM 10 MG PO TABS
20.0000 mg | ORAL_TABLET | Freq: Every day | ORAL | Status: DC
  Administered 2023-07-20 – 2023-07-27 (×8): 20 mg via ORAL
  Filled 2023-07-20 (×8): qty 2

## 2023-07-20 MED ORDER — LACTATED RINGERS IV BOLUS
1000.0000 mL | Freq: Once | INTRAVENOUS | Status: AC
  Administered 2023-07-20: 1000 mL via INTRAVENOUS

## 2023-07-20 MED ORDER — IOHEXOL 350 MG/ML SOLN
75.0000 mL | Freq: Once | INTRAVENOUS | Status: AC | PRN
  Administered 2023-07-20: 75 mL via INTRAVENOUS

## 2023-07-20 MED ORDER — ALLOPURINOL 100 MG PO TABS
200.0000 mg | ORAL_TABLET | Freq: Every day | ORAL | Status: DC
  Administered 2023-07-20 – 2023-07-27 (×8): 200 mg via ORAL
  Filled 2023-07-20 (×8): qty 2

## 2023-07-20 MED ORDER — LORAZEPAM 1 MG PO TABS: 1.0000 mg | ORAL_TABLET | ORAL | Status: DC | PRN

## 2023-07-20 MED ORDER — ACETAMINOPHEN 650 MG RE SUPP: 650.0000 mg | Freq: Four times a day (QID) | RECTAL | Status: DC | PRN

## 2023-07-20 MED ORDER — LEVETIRACETAM 500 MG PO TABS
750.0000 mg | ORAL_TABLET | Freq: Two times a day (BID) | ORAL | Status: DC
  Administered 2023-07-20 – 2023-07-27 (×15): 750 mg via ORAL
  Filled 2023-07-20 (×16): qty 1

## 2023-07-20 MED ORDER — ACETAMINOPHEN 325 MG PO TABS
650.0000 mg | ORAL_TABLET | Freq: Four times a day (QID) | ORAL | Status: DC | PRN
  Administered 2023-07-21 – 2023-07-26 (×8): 650 mg via ORAL
  Filled 2023-07-20 (×7): qty 2

## 2023-07-20 MED ORDER — ENOXAPARIN SODIUM 30 MG/0.3ML IJ SOSY: 30.0000 mg | PREFILLED_SYRINGE | INTRAMUSCULAR | Status: DC

## 2023-07-20 MED ORDER — FOLIC ACID 1 MG PO TABS: 1.0000 mg | ORAL_TABLET | Freq: Every day | ORAL | Status: DC

## 2023-07-20 MED ORDER — THIAMINE MONONITRATE 100 MG PO TABS
100.0000 mg | ORAL_TABLET | Freq: Every day | ORAL | Status: DC
  Administered 2023-07-20 – 2023-07-27 (×8): 100 mg via ORAL
  Filled 2023-07-20 (×8): qty 1

## 2023-07-20 MED ORDER — VANCOMYCIN HCL 750 MG/150ML IV SOLN
750.0000 mg | INTRAVENOUS | Status: DC
  Administered 2023-07-21: 750 mg via INTRAVENOUS
  Filled 2023-07-20: qty 150

## 2023-07-20 MED ORDER — ADULT MULTIVITAMIN W/MINERALS CH
1.0000 | ORAL_TABLET | Freq: Every day | ORAL | Status: DC
  Administered 2023-07-21 – 2023-07-27 (×2): 1 via ORAL
  Filled 2023-07-20 (×6): qty 1

## 2023-07-20 MED ORDER — THIAMINE HCL 100 MG PO TABS: 100.0000 mg | ORAL_TABLET | Freq: Every day | ORAL | Status: DC

## 2023-07-20 MED ORDER — FOLIC ACID 1 MG PO TABS
1.0000 mg | ORAL_TABLET | Freq: Every day | ORAL | Status: DC
  Administered 2023-07-20 – 2023-07-27 (×8): 1 mg via ORAL
  Filled 2023-07-20 (×8): qty 1

## 2023-07-20 MED ORDER — ONDANSETRON HCL 4 MG/2ML IJ SOLN
4.0000 mg | Freq: Once | INTRAMUSCULAR | Status: AC
  Administered 2023-07-20: 4 mg via INTRAVENOUS
  Filled 2023-07-20: qty 2

## 2023-07-20 MED ORDER — PIPERACILLIN-TAZOBACTAM 3.375 G IVPB
3.3750 g | Freq: Three times a day (TID) | INTRAVENOUS | Status: DC
  Administered 2023-07-20 – 2023-07-21 (×2): 3.375 g via INTRAVENOUS
  Filled 2023-07-20 (×2): qty 50

## 2023-07-20 MED ORDER — PANTOPRAZOLE SODIUM 40 MG PO TBEC
40.0000 mg | DELAYED_RELEASE_TABLET | Freq: Two times a day (BID) | ORAL | Status: DC
  Administered 2023-07-20 – 2023-07-27 (×15): 40 mg via ORAL
  Filled 2023-07-20 (×15): qty 1

## 2023-07-20 MED ORDER — LORAZEPAM 2 MG/ML IJ SOLN
1.0000 mg | Freq: Once | INTRAMUSCULAR | Status: AC
  Administered 2023-07-20: 1 mg via INTRAVENOUS
  Filled 2023-07-20: qty 1

## 2023-07-20 MED ORDER — VANCOMYCIN HCL IN DEXTROSE 1-5 GM/200ML-% IV SOLN
1000.0000 mg | Freq: Once | INTRAVENOUS | Status: AC
  Administered 2023-07-20: 1000 mg via INTRAVENOUS
  Filled 2023-07-20: qty 200

## 2023-07-20 MED ORDER — THIAMINE HCL 100 MG/ML IJ SOLN
100.0000 mg | Freq: Every day | INTRAMUSCULAR | Status: DC
  Filled 2023-07-20: qty 2

## 2023-07-20 MED ORDER — SODIUM CHLORIDE 0.9% FLUSH: 10.0000 mL | INTRAVENOUS | Status: DC | PRN

## 2023-07-20 NOTE — Plan of Care (Signed)

## 2023-07-20 NOTE — Progress Notes (Signed)
 1415 patient arrived to unit responds to stimuli alert x4 able to make needs known

## 2023-07-20 NOTE — ED Notes (Signed)
 Pt assisted to bed side commode. Pt needed 2 person assist. Very weak. Pt did have some noticeable tremors while trying to sit up and kept nodding off during bowel movement and when getting cleaned and dressed by myself and Rosalia Colonel, Paramedic.

## 2023-07-20 NOTE — Progress Notes (Signed)
 OT Cancellation Note  Patient Details Name: Christina Byrd MRN: [id number] DOB: Apr 19, 1965   Cancelled Treatment:    Reason Eval/Treat Not Completed: Patient's level of consciousness.  Pt still sleepy and difficult to open/maintain focused attention secondary to drowsiness.  Will attempt to see later today vs tomorrow.    Chrisoula Zegarra OTR/L 07/20/2023, 12:01 PM

## 2023-07-20 NOTE — Progress Notes (Signed)
 CSW added substance abuse resources to patient's AVS.  Edwin Dada, MSW, LCSW Transitions of Care  Clinical Social Worker II [phone]

## 2023-07-20 NOTE — ED Notes (Signed)
 Patient transported to CT

## 2023-07-20 NOTE — Progress Notes (Signed)
 Internal Medicine Clinic Attending  Case discussed with the resident at the time of the visit.  We reviewed the resident's history and exam and pertinent patient test results.  I agree with the assessment, diagnosis, and plan of care documented in the resident's note.   This is a sick patient well known to the IMTS clinic and inpatient teams. I am worried that she is continuing to drink alcohol. Will arrange very close follow up in our clinic.

## 2023-07-20 NOTE — Progress Notes (Signed)
 Pharmacy Antibiotic Note  Christina Byrd is a 58 y.o. female admitted on 07/19/2023 with sepsis. Noted allergy history in chart with possible infusion reaction noted, discussed with provider. Cefepime  noted to have a reaction at same time as vancomycin , but patient has tolerated previous penicillins recently (zosyn  seemed to be tolerated 05/2023). If any reaction noted with vancomycin , can slow down rate to increase tolerability. Currently in AKI - Scr 1.05 (BL ~0.7). Pharmacy has been consulted for vancomycin  dosing.  Plan: Vancomycin  1000mg  x 1 load > vancomycin  750mg  q24h (eAUC 538, Scr 1.05)  F/u renal function, infectious work up and narrow as able Vancomycin  levels as needed  Height: 5\' 2"  (157.5 cm) Weight: 47.2 kg (104 lb) IBW/kg (Calculated) : 50.1  Temp (24hrs), Avg:98.6 F (37 C), Min:98.1 F (36.7 C), Max:99.3 F (37.4 C)  Recent Labs  Lab 07/14/23 1458 07/19/23 2229 07/20/23 0510 07/20/23 0737 07/20/23 0950 07/20/23 1123 07/20/23 1216 07/20/23 1317  WBC  --  6.2  --   --   --   --   --   --   CREATININE 0.76 1.03*  --   --   --  1.05*  --   --   LATICACIDVEN  --   --  4.3* 5.0* 4.9*  --  5.0* 5.8*    Estimated Creatinine Clearance: 44 mL/min (A) (by C-G formula based on SCr of 1.05 mg/dL (H)).    Allergies  Allergen Reactions   Losartan  Potassium     Rash   Levaquin  [Levofloxacin  In D5w] Rash   Ace Inhibitors Cough    REACTION: cough   Codeine  Nausea Only   Cefepime  Rash    09/04/12 pm Patient started to break out with small macules after IV Vanco infusion, then macules increased in size after starting cefepime  infusion.   Chlorhexidine Itching and Rash   Vancomycin  Rash    09/04/12 pm Patient started to break out with small macules after IV Vanco infusion, then macules increased in size after starting cefepime  infusion.    Antimicrobials this admission: Zosyn  5/28 > Vancomycin  5/28 >   Microbiology results: 5/28 BCx:   Thank you for allowing  pharmacy to be a part of this patient's care.  Fonda Hymen 07/20/2023 2:36 PM

## 2023-07-20 NOTE — Discharge Instructions (Addendum)
 You came to the hospital for tingling of your hands and you were diagnosed with alcoholic ketoacidosis.  We treated you with IV fluids and alcohol cessation.   We have started you on the following medications:  -Naltrexone  50 mg daily: This medication helps decrease your alcohol cravings  -Tylenol  take 650 mg every 6 hours as needed for pain -magnesium  oxide 400 (240 Mg) MG tablet, take daily for low magnesium   -thiamine  100 MG tablet: take daily  -vitamin D3 25 MCG tablet take one pill daily for low vitamin D      We have stopped the following medications: -ergocalciferol  1.25 MG (50000 UT) capsule  -fluticasone  50 MCG/ACT nasal spray    The best thing you can do for your self is to continue to avoid ANY alcohol intake!    Follow-up appointments: Please visit your family doctor on June 11th at 9:15    If you have any questions or concerns please feel free to call: Internal medicine clinic at [phone]   If you have any of these following symptoms, please call us  or seek care at an emergency department: -Chest Pain -Difficulty Breathing -Syncope (passing out) -Drooping of face -Slurred speech -Sudden weakness in your leg or arm -Fever -Chills   We are glad that you are feeling better, it was a pleasure to care for you!  Aurora Lees DO                                        Outpatient Substance Abuse  Treatment- uninsured  Narcotics Anonymous 24-HOUR HELPLINE Pre-recorded for Meeting Schedules [facility] [phone]  [email or web address] ALCOHOLICS ANONYMOUS  [place]   Answering Service [phone] Please Note: All [place] Meetings are Non-smoking [email or web address].es  Alcohol and Drug Services -  Insurance: Medicaid /State funding/private insurance Methadone, suboxone/Intensive outpatient  [place]   [phone] Fax: [phone] [address], [place], [place], [address] [place] [phone] Fax: [phone]    [address], [place], [place], [address] ([address] [place], [place], [place], [place], [place], [place], [place], [place]) [facility] [email or web address] Accepts State funding/Medicaid Transitional housing, Intensive Outpatient Treatment, Outpatient treatment, Veterans Services  Phone: [phone] Fax: [phone] Address: [address], [place] [address]   [facility] [employer] [employer] of Care (http://[email or web address].info/) Insurance: Medicaid Case Management, Administrator, arts, Medication Management, Outpatient Therapy, Psychosocial Rehabilitation, Substance Abuse Intensive Outpatient  Phone: [phone] Fax: [phone] [address], [place], [place], [address]  [facility] Medicaid, most private insurance providers Types of Program: Individual/Group Therapy, Substance Abuse Treatment  Phone: [place] [phone] Fax: [phone] [address], [place], [place], [address] [place] [phone] [address], [address] [place], [place], [address]  [facility]  Medicaid Types of Program: SAIOP  Phone: [phone] Fax: [phone] [address] [place], [place], [place], [address] [facility] Medicaid/state funds Crisis line [phone] [facility] [phone] [place] [phone] [place] [phone]  [facility] [address] [place] [address];  [place], [place] [address] [phone]  Substance Abuse Intensive Outpatient Program [facility] [address] [place], [place] [address] [phone]- Substance abuse treatment  Success[facility]  Insurance: [facility], [address], sliding scale Types of Program: substance abuse treatment, transportation assistance Phone: [phone] Fax: [phone] Address: [address], [place] [address]  [facility] (TrendSwap.ch) Insurance: UHC, [place], [place], [place] [facility] Program: addiction counseling, detoxification,  Phone: [phone]  Fax: [phone] Address: [address] [place], [place] [address]  [facility] (statewide facilities/programs) [address] (Medicaid/state funds) [place], [place] [address]                      http://[email or web address]/ [phone] Jayson Michael- [phone] [place]- [phone] [facility] [place] (2 Locations) (Medicaid/state funds) --[address]  walk in 8:30-12 and 1-2:30 [place], [id number]   [facility]- [phone] --[address] [place], [place] [address]  [phone] walk in 8:30-12 and 2-3:30  [facility] state funds/medicaid [address] [place], [place] [address] [phone] [facility] (Suboxone clinic) Medicaid/state funds  [address]  [place], [place] [address] [phone]   [facility]  [address] [place], [place] [address]  [phone] (24 hours) [place]- [address] [place], [place] [address]  [phone] (24 hours) [place]- [address] Alene Husk [phone] - [address] Georgeana Kindler [phone] Carry Clapper [address] [place] [phone] [facility]- Medicaid and state funds  [place]- [address] [place], [place] [address] [phone] (24 hours) [place]- [address] [place], [place] [address] [phone] [facility]- [address] [place], [place] [address] [phone] (24 hours) [place] [address] [place], [place]  [address] [phone] [place]- [address] [place] [phone]

## 2023-07-20 NOTE — Evaluation (Signed)
 Physical Therapy Evaluation Patient Details Name: Christina Byrd MRN: [id number] DOB: 1965/10/18 Today's Date: 07/20/2023  History of Present Illness  58 y.o. female presents to [facility] hospital on 07/19/2023 with tremors, nausea and vomiting. PMH includes alcohol abuse, alcoholic pancreatitis, seizure disorder, Wernicke's encephalopathy.  Clinical Impression  Pt presents to PT with deficits in functional mobility, gait, balance, strength, endurance, cognition. Pt is very groggy initially, possibly related to ativan  from earlier in the day. With mobility the pt becomes more alert, however demonstrates significant instability with ambulation attempts. The pt is at a high risk for falls due to balance deficits. Patient will benefit from continued inpatient follow up therapy, <3 hours/day. PT will follow in the acute setting.        If plan is discharge home, recommend the following: A lot of help with walking and/or transfers;A lot of help with bathing/dressing/bathroom;Assistance with cooking/housework;Assist for transportation;Help with stairs or ramp for entrance   Can travel by private vehicle   Yes    Equipment Recommendations  (TBD pending progress)  Recommendations for Other Services       Functional Status Assessment Patient has had a recent decline in their functional status and demonstrates the ability to make significant improvements in function in a reasonable and predictable amount of time.     Precautions / Restrictions Precautions Precautions: Fall Recall of Precautions/Restrictions: Impaired Restrictions Weight Bearing Restrictions Per Provider Order: No      Mobility  Bed Mobility Overal bed mobility: Needs Assistance Bed Mobility: Supine to Sit, Sit to Supine     Supine to sit: Min assist, HOB elevated Sit to supine: Mod assist, Contact guard assist   General bed mobility comments: modA for initial supine to sit, CGA for 2nd attempt    Transfers Overall  transfer level: Needs assistance Equipment used: 1 person hand held assist Transfers: Sit to/from Stand, Bed to chair/wheelchair/BSC Sit to Stand: Min assist   Step pivot transfers: Min assist            Ambulation/Gait Ambulation/Gait assistance: Mod assist Gait Distance (Feet): 10 Feet Assistive device: IV Pole Gait Pattern/deviations: Trunk flexed Gait velocity: reduced Gait velocity interpretation: <1.31 ft/sec, indicative of household ambulator   General Gait Details: pt with increased trunk flexion initially, staggering forward with initial steps. Pt then utilizes IV pole for UE support with PT assist at trunk  Stairs            Wheelchair Mobility     Tilt Bed    Modified Rankin (Stroke Patients Only)       Balance Overall balance assessment: Needs assistance Sitting-balance support: No upper extremity supported, Feet supported Sitting balance-Leahy Scale: Fair     Standing balance support: Bilateral upper extremity supported, Reliant on assistive device for balance Standing balance-Leahy Scale: Poor                               Pertinent Vitals/Pain Pain Assessment Pain Assessment: No/denies pain    Home Living Family/patient expects to be discharged to:: Private residence Living Arrangements: Spouse/significant other Available Help at Discharge: Family;Available PRN/intermittently Type of Home: House Home Access: Ramped entrance Entrance Stairs-Rails: Right;Left;Can reach both Entrance Stairs-Number of Steps: 4   Home Layout: One level Home Equipment: Agricultural consultant (2 wheels);BSC/3in1;Shower seat Additional Comments: history obtained from recent admission as pt is initially too groggy to provide history    Prior Function Prior Level of Function : Needs  assist             Mobility Comments: pt reports ambulating unassisted at baseline, last admission notes multiple falls and pt reporting assistance from her spouse ADLs  Comments: Pt's husband assists her with dressing, bathing, toileting, and IADLs.     Extremity/Trunk Assessment   Upper Extremity Assessment Upper Extremity Assessment: Generalized weakness    Lower Extremity Assessment Lower Extremity Assessment: Generalized weakness    Cervical / Trunk Assessment Cervical / Trunk Assessment: Kyphotic  Communication   Communication Communication: No apparent difficulties    Cognition Arousal: Lethargic (awakens with mobility) Behavior During Therapy: Flat affect   PT - Cognitive impairments: Safety/Judgement, Problem solving, Awareness                         Following commands: Impaired Following commands impaired: Follows one step commands with increased time     Cueing Cueing Techniques: Verbal cues, Tactile cues     General Comments General comments (skin integrity, edema, etc.): tachycardia into 130's with activity    Exercises     Assessment/Plan    PT Assessment Patient needs continued PT services  PT Problem List Decreased strength;Decreased activity tolerance;Decreased balance;Decreased mobility;Decreased knowledge of use of DME;Decreased safety awareness;Decreased knowledge of precautions       PT Treatment Interventions DME instruction;Gait training;Stair training;Functional mobility training;Therapeutic activities;Therapeutic exercise;Balance training;Neuromuscular re-education;Cognitive remediation;Patient/family education    PT Goals (Current goals can be found in the Care Plan section)  Acute Rehab PT Goals Patient Stated Goal: to return to independence PT Goal Formulation: With patient Time For Goal Achievement: 08/03/23 Potential to Achieve Goals: Fair    Frequency Min 2X/week     Co-evaluation               AM-PAC PT "6 Clicks" Mobility  Outcome Measure Help needed turning from your back to your side while in a flat bed without using bedrails?: A Little Help needed moving from lying on  your back to sitting on the side of a flat bed without using bedrails?: A Little Help needed moving to and from a bed to a chair (including a wheelchair)?: A Little Help needed standing up from a chair using your arms (e.g., wheelchair or bedside chair)?: A Little Help needed to walk in hospital room?: A Lot Help needed climbing 3-5 steps with a railing? : Total 6 Click Score: 15    End of Session Equipment Utilized During Treatment: Gait belt Activity Tolerance: Patient tolerated treatment well Patient left: in bed;with call bell/phone within reach;with bed alarm set Nurse Communication: Mobility status PT Visit Diagnosis: Other abnormalities of gait and mobility (R26.89);Muscle weakness (generalized) (M62.81);History of falling (Z91.81)    Time: 4098-1191 PT Time Calculation (min) (ACUTE ONLY): 33 min   Charges:   PT Evaluation $PT Eval Low Complexity: 1 Low PT Treatments $Therapeutic Activity: 8-22 mins PT General Charges $$ ACUTE PT VISIT: 1 Visit         Rexie Catena, PT, DPT Acute Rehabilitation Office [phone]   Rexie Catena 07/20/2023, 6:11 PM

## 2023-07-20 NOTE — ED Notes (Signed)
 Phlebotomy asked to collect blood as VBG was hemolyzed again and this paramedic unable to obtain blood

## 2023-07-20 NOTE — H&P (Addendum)
 Date: 07/20/2023               Patient Name:  Christina Byrd MRN: [id number]  DOB: 01-29-1966 Age / Sex: 58 y.o., female   PCP: Carleen Chary, DO         Medical Service: Internal Medicine Teaching Service         Attending Physician: Dr. Broadus Canes, Whitney Hams, MD      First Contact: Dr. [name] MD     Second Contact: Dr. 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  The patient states that her hands and toes are numb and that is what brought her into the ER. Per her husband, this has been a chronic issue for the patient. She has also been so weak and ataxic that she had been recommended to go to SNF. Currently, she denies any abdominal pain to me, although she does endorse nausea and vomiting, with her last episode of vomiting being en route to the hospital.   Of note, she was admitted to our service from 5/12-5/16 with encephalopathy and weakness, concerning for Wernicke's encephalopathy vs vascular dementia. Her symptoms improved by the time of discharge and she was stable for d/c to SNF, although she decided to go home. She also had similar abdominal pain during that admission and was treated for alcohol pancreatitis which resolved with supportive care.    Meds:  Current Meds  Medication Sig   allopurinol  (ZYLOPRIM ) 100 MG tablet Take 2 tablets (200 mg total) by mouth daily.   atorvastatin  (LIPITOR) 20 MG tablet Take 1 tablet (20 mg total) by mouth daily.   cetirizine  (ZYRTEC  ALLERGY) 10 MG tablet Take 1 tablet (10 mg total) by mouth daily.   DULoxetine  (CYMBALTA ) 30 MG capsule Take 1  capsule (30 mg total) by mouth daily.   etodolac  (LODINE ) 400 MG tablet Take 1 tablet (400 mg total) by mouth 2 (two) times daily.   feeding supplement (ENSURE ENLIVE / ENSURE PLUS) LIQD Take 237 mLs by mouth 3 (three) times daily between meals.   folic acid  (FOLVITE ) 1 MG tablet Take 1 tablet (1 mg total) by mouth daily.   gabapentin  (NEURONTIN ) 100 MG capsule Take 1 capsule (100 mg total) by mouth 3 (three) times daily.   levETIRAcetam  (KEPPRA ) 750 MG tablet Take 1 tablet (750 mg total) by mouth 2 (two) times daily.   Multiple Vitamin (MULTIVITAMIN WITH MINERALS) TABS tablet Take 1 tablet by mouth daily.   naltrexone (DEPADE) 50 MG tablet Take 1 tablet (50 mg total) by mouth daily.   ondansetron  (ZOFRAN ) 4 MG tablet Take 1 tablet (4 mg total) by mouth every 8 (eight) hours as needed for nausea or vomiting.   pantoprazole  (PROTONIX ) 40 MG tablet Take 1 tablet (40 mg total) by mouth 2 (two) times daily.   thiamine  (VITAMIN B1) 100 MG tablet Take 1 tablet (100 mg total) by mouth daily.    Past Medical History  Past Surgical History:  Procedure Laterality Date   BIOPSY  06/03/2022   Procedure: BIOPSY;  Surgeon: Elois Hair, MD;  Location: [facility] ENDOSCOPY;  Service: Gastroenterology;;   COLONOSCOPY N/A 06/21/2023   Procedure: COLONOSCOPY;  Surgeon: Nannette Babe, MD;  Location: [facility] ENDOSCOPY;  Service: Gastroenterology;  Laterality: N/A;   ESOPHAGOGASTRODUODENOSCOPY N/A 12/29/2022   Procedure: ESOPHAGOGASTRODUODENOSCOPY (EGD);  Surgeon: Nandigam, Kavitha V, MD;  Location: [facility] ENDOSCOPY;  Service: Gastroenterology;  Laterality: N/A;   ESOPHAGOGASTRODUODENOSCOPY N/A 06/21/2023   Procedure: EGD (ESOPHAGOGASTRODUODENOSCOPY);  Surgeon: Nannette Babe, MD;  Location: [facility] ENDOSCOPY;  Service: Gastroenterology;  Laterality: N/A;   ESOPHAGOGASTRODUODENOSCOPY (EGD) WITH PROPOFOL  N/A 06/03/2022   Procedure: ESOPHAGOGASTRODUODENOSCOPY (EGD) WITH PROPOFOL ;  Surgeon: Elois Hair, MD;  Location: [facility]  ENDOSCOPY;  Service: Gastroenterology;  Laterality: N/A;   HOT HEMOSTASIS N/A 06/03/2022   Procedure: HOT HEMOSTASIS (ARGON PLASMA COAGULATION/BICAP);  Surgeon: Elois Hair, MD;  Location: [facility] ENDOSCOPY;  Service: Gastroenterology;  Laterality: N/A;    Social:  Lives With: husband Level of Function: dependent of ADLs  PCP: Dr. Diann Forth Substances: Drinks at least 1/2 gallon of liquor per week.   Family History: reviewed, no pertinent updates  Allergies: Allergies as of 07/19/2023 - Review Complete 07/19/2023  Allergen Reaction Noted   Losartan  potassium  12/20/2012   Levaquin  [levofloxacin  in d5w] Rash 02/06/2013   Ace inhibitors Cough 01/30/2008   Codeine  Nausea Only 05/26/2015   Cefepime  Rash 09/05/2012   Chlorhexidine Itching and Rash 07/09/2022   Vancomycin  Rash 09/05/2012    Review of Systems: A complete ROS was negative except as per HPI.   OBJECTIVE:   Physical Exam: Blood pressure 112/82, pulse (!) 123, temperature 98.3 F (36.8 C), temperature source Oral, resp. rate 15, height 5\' 2"  (1.575 m), weight 47.2 kg, last menstrual period 08/23/2011, SpO2 100%.   Constitutional: chronically ill appearing female laying in bed Cardiovascular: regular rate and rhythm, no m/r/g Pulmonary/Chest: normal work of breathing on room air, lungs clear to auscultation bilaterally Abdominal: soft, non-tender, non-distended MSK: normal bulk and tone Neurological: somnolent, but oriented x3. CN II-XII intact. Bilateral upper extremity tremors at rest. RUE/LUE strength 4/5. Markedly reduced bilateral lower extremity strength, although low effort in testing. Upon repeat exam in the afternoon she is somnolent, awakes to painful stimuli but hardly opens eyes to voice. Not able to follow instructions, nor is she able to answer orientation questions.  Skin: warm and dry  Labs: CBC    Component Value Date/Time   WBC 6.2 07/19/2023 2229   RBC 3.08 (L) 07/19/2023 2229   HGB 9.5 (L)  07/20/2023 0345   HGB 12.4 04/27/2022 1125   HCT 28.0 (L) 07/20/2023 0345   HCT 37.6 04/27/2022 1125   PLT 244 07/19/2023 2229   PLT 210 04/27/2022 1125   MCV 103.6 (H) 07/19/2023 2229   MCV 97 04/27/2022 1125   MCH 32.1 07/19/2023 2229   MCHC 31.0 07/19/2023 2229   RDW 17.3 (H) 07/19/2023 2229   RDW 17.0 (H) 04/27/2022 1125   LYMPHSABS 1.5 06/22/2023 0639   LYMPHSABS 1.5 04/27/2022 1125   MONOABS 1.0 06/22/2023 0639   EOSABS 0.1 06/22/2023 0639   EOSABS 0.0 04/27/2022 1125   BASOSABS 0.0 06/22/2023 0639   BASOSABS 0.0 04/27/2022 1125     CMP     Component Value Date/Time   NA 143 07/20/2023 0345   NA 141 07/14/2023 1458   K 3.8 07/20/2023 0345   CL 117 (H) 07/19/2023 2229   CO2 10 (L) 07/19/2023 2229   GLUCOSE 75 07/19/2023 2229   BUN 8 07/19/2023 2229   BUN 9 07/14/2023 1458   CREATININE 1.03 (H)  07/19/2023 2229   CREATININE 1.00 03/21/2014 1556   CALCIUM  7.6 (L) 07/19/2023 2229   PROT 6.3 (L) 07/19/2023 2229   PROT 5.6 (L) 07/14/2023 1458   ALBUMIN 2.6 (L) 07/19/2023 2229   ALBUMIN 2.7 (L) 07/14/2023 1458   AST 73 (H) 07/19/2023 2229   ALT 51 (H) 07/19/2023 2229   ALKPHOS 194 (H) 07/19/2023 2229   BILITOT 0.8 07/19/2023 2229   BILITOT 0.3 07/14/2023 1458   GFRNONAA >60 07/19/2023 2229   GFRNONAA >89 12/11/2013 1214   GFRAA 61 01/02/2020 1100   GFRAA >89 12/11/2013 1214    Imaging: 1. No acute findings in the abdomen or pelvis. Specifically, no findings to explain the patient's history of nausea and vomiting. 2. The peripancreatic edema/inflammation seen on the previous study has resolved. Small volume free fluid seen on the previous study has also resolved in the interval. 3. Hepatic steatosis. 4. Cholelithiasis. 5. Uterine fibroids. 6.  Aortic Atherosclerosis (ICD10-I70.0).   ASSESSMENT & PLAN:   Assessment & Plan by Problem: Principal Problem:   Alcoholic ketoacidosis   Christina Byrd is a 58 y.o. 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 Ethanol elevated to 117. No ketones in urine, but BHB was elevated to 0.73, consistent with alcoholic ketoacidosis. Lactic also elevated to 4.9 which has not improved despite 4 L IVF. -  Repeat IVF bolus - Trend lactic - Follow up repeat BMP and VBG - Follow up volatiles - check CBG, will give amp of D50 - PT/OT/SLP  Encephalopathy Unclear etiology at this time. Metabolic etiology is certainly on the differential. Infection seems less likely, although she does have a lower extremity cellulitis (but no systemic signs of infection). Structural etiology still not ruled out. Toxin also on differential with alcohol withdrawal (came in with intoxication). Urinary retention could also be contributing - see plan below. Patient is not hypoglycemic and I also doubt she has thiamine  deficiency, as she was treated with high dose thiamine , and continues to get thiamine  supplementation now.  - CT head pending - Ammonia level - Follow up blood cultures - Start empiric vanc/cefepime  for sepsis/cellulitis - UDS pending  Left lower extremity cellulitis Left leg is erythematous around the area of her tattoo- no open cuts or wounds seen. Temperature of her leg is equal compared to the other leg. I doubt this infection is the cause of her encephalopathy, as she has no systemic signs of infection. - Vanc/zosyn  for broad antibiotic coverage (will slow down vanc infusion with questionable allergy that she has had) - Avoid cefepime  given questionable allergy and AMS - Blood cultures pending  Urinary retention Bladder scan with 350 cc.  - In n out cath ordered - Follow up q6h bladder scans  AKI Cr 1.03, up  from baseline of 0.7. Likely prerenal AKI in the setting of dehydration/alcoholic ketoacidosis. - IVF as above - Trend renal function - Avoid nephrotoxins  Hx of Wernicke's encephalopathy Patient was treated with high dose thiamine  during her previous admission. Upon hospital follow up, the patient's husband did endorse that the patient confabulates, raising concern that she may have Korsakoff syndrome. Upon my re-evaluation of this patient around 1100, she was more somnolent, although that could certainly be because she has  been in the ED since 8pm yesterday and did not get much sleep.  - Continue thiamine  and folic acid  - Work up as noted above  Abdominal pain Alcoholic steatohepatitis  Patient did endorse abdominal pain to the ED provider, but denied this when I asked. CT scan did show resolution of pancreatitis that was seen on previous admission. Lipase only slightly elevated to 86. AST/ALT elevated to 73/51, although improved from lab work that was obtained 6 days ago. - Trend CMP   Sinus tachycardia Patient has chronic tachycardia with heart rate ranging in the 110s-120s during outpatient appointments.  - IVF as above  Hx of dysphagia Esophagram in April noted that she has poor esophageal contracting/dysmotility. SLP consulted and recommended a liquid diet.  - Will make patient NPO due to somnolence/encephalopathy   Normocytic anemia Hb stable at 9.9 - was 8.0 at discharge. - Daily CBC  Seizure disorder On keppra  750 mg BID - continue home med.  HFimpEF Last EF 60-65% in November. Patient was hypovolemic initially and received fluid resuscitation.   Gout On allopurinol  200 mg daily.   Diet: NPO VTE: Enoxaparin  IVF: LR,Bolus Code: Full  Prior to Admission Living Arrangement: Home, living husbamd Anticipated Discharge Location: SNF Barriers to Discharge: medical stability  Dispo: Admit patient to Observation with expected length of stay less than 2  midnights.  Signed: Toivo Bordon N, DO Internal Medicine Resident PGY-3  07/20/2023, 7:27 AM

## 2023-07-20 NOTE — Evaluation (Signed)
 Clinical/Bedside Swallow Evaluation Patient Details  Name: Christina Byrd MRN: [id number] Date of Birth: Nov 04, 1965  Today's Date: 07/20/2023 Time: SLP Start Time (ACUTE ONLY): 1016 SLP Stop Time (ACUTE ONLY): 1031 SLP Time Calculation (min) (ACUTE ONLY): 15 min  Past Medical History:  Past Medical History:  Diagnosis Date   GASTROESOPHAGEAL REFLUX, NO ESOPHAGITIS 04/21/2006   Qualifier: Diagnosis of  By: WATT, JOANNE     GERD (gastroesophageal reflux disease) Dx 1995   Hot flash, menopausal 06/22/2016   Hot flashes 01/30/2020   HTN (hypertension)    Hypotension 10/21/2022   Patient's blood pressure at presentation was 87/65, follow up blood pressure was 81/58. Patient reports regurgitating everything she has taken po for the past month. Denies lightheadedness or dizziness. Discussed GI referral, though patient stated she would be going to the emergency room today for IV fluids.   -GI referral for upper endoscopy placed  -Patient planning on going to the ED for IV flu   Intertrigo 06/22/2016   Nausea & vomiting 12/23/2022   Nausea and vomiting 12/24/2022   Pain and swelling of left ankle 12/02/2014   Seizures (HCC)    Tendinitis of right hip flexor 07/23/2014   Tendonitis, Achilles, right 04/01/2014   Past Surgical History:  Past Surgical History:  Procedure Laterality Date   BIOPSY  06/03/2022   Procedure: BIOPSY;  Surgeon: Elois Hair, MD;  Location: [facility] ENDOSCOPY;  Service: Gastroenterology;;   COLONOSCOPY N/A 06/21/2023   Procedure: COLONOSCOPY;  Surgeon: Nannette Babe, MD;  Location: [facility] ENDOSCOPY;  Service: Gastroenterology;  Laterality: N/A;   ESOPHAGOGASTRODUODENOSCOPY N/A 12/29/2022   Procedure: ESOPHAGOGASTRODUODENOSCOPY (EGD);  Surgeon: Nandigam, Kavitha V, MD;  Location: [facility] ENDOSCOPY;  Service: Gastroenterology;  Laterality: N/A;   ESOPHAGOGASTRODUODENOSCOPY N/A 06/21/2023   Procedure: EGD (ESOPHAGOGASTRODUODENOSCOPY);  Surgeon: Nannette Babe, MD;  Location: [facility]  ENDOSCOPY;  Service: Gastroenterology;  Laterality: N/A;   ESOPHAGOGASTRODUODENOSCOPY (EGD) WITH PROPOFOL  N/A 06/03/2022   Procedure: ESOPHAGOGASTRODUODENOSCOPY (EGD) WITH PROPOFOL ;  Surgeon: Elois Hair, MD;  Location: [facility] ENDOSCOPY;  Service: Gastroenterology;  Laterality: N/A;   HOT HEMOSTASIS N/A 06/03/2022   Procedure: HOT HEMOSTASIS (ARGON PLASMA COAGULATION/BICAP);  Surgeon: Elois Hair, MD;  Location: [facility] ENDOSCOPY;  Service: Gastroenterology;  Laterality: N/A;   HPI:  Christina Byrd is a 58 y.o. female who presents to ED for evaluation of tremors, nausea and vomiting. PMH alcohol use, alcoholic pancreatitis, seizure disorder, Wernicke's encephalopathy, GERD, esophageal dysphagia. Has had three clinical swallow assessments in the last six months, all of which led to recommendations for GI w/u and findings of no oropharyngeal dysphagia. EGD 06/03/22, 12/29/22, 06/21/23.  MBS 12/2022 no penetration/aspiration, no pharyngeal residuals. Barium esophagram completed on 06/15/23 " Poor primary peristaltic wave with incomplete esophageal emptying. Tertiary contractions noted, very small hiatal hernia with associated B-ring, Gastroesophageal reflux: Spontaneous gastroesophageal reflux with regurgitation of barium."    Assessment / Plan / Recommendation  Clinical Impression  Pt presents with a hx esophageal dysphagia which significantly impacts functional eating.  Her oropharyngeal swallow function, as assessed today, remains normal and consistent with prior tests (see HPI). She protects her airway well. Despite being lethargic today, she awakened in order to drink thin water  and had several spoonsfuls of applesauce before falling back to sleep. No changes in swallow function observed.  Recommend starting with full liquids then advancing per medical team as pt's MS returns to normal.  Consider re-consulting GI if there is anything they can offer to address the dysmotility. No further SLP  needs  are identified.  Our service will sign off.    SLP Visit Diagnosis: Dysphagia, unspecified (R13.10) (esophageal)           Diet Recommendation   Other (Comment) (full liquids)  Medication Administration: Whole meds with liquid    Other  Recommendations Recommended Consults: Consider GI evaluation Oral Care Recommendations: Oral care BID    Recommendations for follow up therapy are one component of a multi-disciplinary discharge planning process, led by the attending physician.  Recommendations may be updated based on patient status, additional functional criteria and insurance authorization.  Follow up Recommendations No SLP follow up        Swallow Study   General Date of Onset: 07/20/23 HPI: Christina Byrd is a 58 y.o. female who presents to ED for evaluation of tremors, nausea and vomiting. PMH alcohol use, alcoholic pancreatitis, seizure disorder, Wernicke's encephalopathy, GERD, esophageal dysphagia. Has had three clinical swallow assessments in the last six months, all of which led to recommendations for GI w/u and findings of no oropharyngeal dysphagia. EGD 06/03/22, 12/29/22, 06/21/23.  MBS 12/2022 no penetration/aspiration, no pharyngeal residuals. Barium esophagram completed on 06/15/23 " Poor primary peristaltic wave with incomplete esophageal emptying. Tertiary contractions noted, very small hiatal hernia with associated B-ring, Gastroesophageal reflux: Spontaneous gastroesophageal reflux with regurgitation of barium." Type of Study: Bedside Swallow Evaluation Previous Swallow Assessment: see HPI Diet Prior to this Study: NPO Temperature Spikes Noted: No Respiratory Status: Room air History of Recent Intubation: No Behavior/Cognition: Lethargic/Drowsy Oral Cavity Assessment: Within Functional Limits Oral Care Completed by SLP: No Oral Cavity - Dentition: Edentulous Vision:  (at baseline, functional for self-feeding) Patient Positioning: Upright in bed Baseline Vocal  Quality: Normal Volitional Cough: Cognitively unable to elicit Volitional Swallow: Unable to elicit    Oral/Motor/Sensory Function Overall Oral Motor/Sensory Function: Within functional limits   Ice Chips Ice chips: Not tested   Thin Liquid Thin Liquid: Within functional limits Presentation: Straw    Nectar Thick Nectar Thick Liquid: Not tested   Honey Thick Honey Thick Liquid: Not tested   Puree Puree: Within functional limits   Solid     Solid: Not tested      Christina Byrd 07/20/2023,10:35 AM  Christina Asa L. Beatris Lincoln, MA CCC/SLP Clinical Specialist - Acute Care SLP Acute Rehabilitation Services Office number [phone]

## 2023-07-20 NOTE — Plan of Care (Signed)
   Problem: Clinical Measurements: Goal: Diagnostic test results will improve Outcome: Progressing

## 2023-07-20 NOTE — Progress Notes (Signed)
 Paged by patient's nurse regarding lactic acid of 3.3. Assessed patient at bedside to help determine if she would benefit from additional IV fluids. Patient's spouse at bedside. Patient appeared to be sleeping comfortably. She was alert enough to cooperate with physical exam. Lungs clear bilaterally, abdomen non-tender and non-distended, no lower extremity edema appreciated. Given only mildly improved lactic acid will put in for another 1 L LR bolus. Will follow up additional lactic acid. Patient and patient's spouse updated with plan, all questions answered.  Machai Desmith Arellano Zameza, MD PGY-1 [facility]

## 2023-07-20 NOTE — ED Notes (Addendum)
 Lactic resulted as 4.3, istat machine has not transferred results into chart. RN aware

## 2023-07-20 NOTE — ED Provider Notes (Signed)
 [place] EMERGENCY DEPARTMENT AT [facility] Provider Note  CSN: [id number] Arrival date & time: 07/19/23 2008  Chief Complaint(s) Tremors and Emesis  HPI Christina Byrd is a 58 y.o. female with PMH alcohol use, alcoholic pancreatitis, seizure disorder, Warnicke's encephalopathy, B12 deficiency who presents emergency room for evaluation of tremors nausea and vomiting.  States that symptoms have been worsening over the last few days.  Last drink was last night.  Denies abdominal pain but endorses persistent vomiting and patient vomiting on arrival.   Past Medical History Past Medical History:  Diagnosis Date   GASTROESOPHAGEAL REFLUX, NO ESOPHAGITIS 04/21/2006   Qualifier: Diagnosis of  By: WATT, JOANNE     GERD (gastroesophageal reflux disease) Dx 1995   Hot flash, menopausal 06/22/2016   Hot flashes 01/30/2020   HTN (hypertension)    Hypotension 10/21/2022   Patient's blood pressure at presentation was 87/65, follow up blood pressure was 81/58. Patient reports regurgitating everything she has taken po for the past month. Denies lightheadedness or dizziness. Discussed GI referral, though patient stated she would be going to the emergency room today for IV fluids.   -GI referral for upper endoscopy placed  -Patient planning on going to the ED for IV flu   Intertrigo 06/22/2016   Nausea & vomiting 12/23/2022   Nausea and vomiting 12/24/2022   Pain and swelling of left ankle 12/02/2014   Seizures (HCC)    Tendinitis of right hip flexor 07/23/2014   Tendonitis, Achilles, right 04/01/2014   Patient Active Problem List   Diagnosis Date Noted   Wernicke encephalopathy 07/16/2023   Acute pancreatitis 07/05/2023   Encephalopathy 07/04/2023   Elevated liver enzymes 07/04/2023   Falls frequently 07/04/2023   Abdominal pain 07/04/2023   Weakness 07/04/2023   Metabolic acidosis 07/04/2023   Physical deconditioning 06/22/2023   History of non anemic vitamin B12 deficiency  06/20/2023   Dehydration 06/18/2023   Aspiration pneumonia of left lower lobe (HCC) 06/18/2023   AKI (acute kidney injury) (HCC) 06/13/2023   Aortic atherosclerosis (HCC) 05/26/2023   Nausea & vomiting 05/03/2023   Alcoholic peripheral neuropathy (HCC) 05/03/2023   Breakthrough seizure (HCC) 01/23/2023   Localized skin desquamation 01/06/2023   Hypothyroidism 01/06/2023   Vitamin D  deficiency 01/06/2023   CKD (chronic kidney disease) stage 3, GFR 30-59 ml/min (HCC) 01/06/2023   Abnormal LFTs 12/28/2022   Chronic diarrhea 12/28/2022   Heme positive stool 12/28/2022   Protein-calorie malnutrition, severe (HCC) 12/27/2022   Electrolyte abnormality 12/26/2022   Hypocalcemia 11/19/2022   Hypoalbuminemia due to protein-calorie malnutrition (HCC) 11/19/2022   Hyperlipidemia 11/19/2022   Elevated lipase 11/19/2022   Osteoarthritis 09/02/2022   History of noncompliance with medical treatment 07/27/2022   Hepatic steatosis 07/27/2022   Acute pain of right knee 07/08/2022   Transaminitis 07/08/2022   Normocytic anemia 07/08/2022   Alcohol withdrawal syndrome without complication (HCC) 07/07/2022   Duodenal ulcer hemorrhage 06/03/2022   AVM (arteriovenous malformation) of stomach, 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   Anemia due to chronic blood loss 06/03/2022   Dysphagia 03/28/2020   Cervicalgia 03/28/2020   Hot flashes 01/30/2020   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   Depression 07/08/2016   Seizure disorder (HCC) 07/08/2016   Cocaine use    Gout of left ankle 10/09/2015   Left hip pain 09/02/2015   Colonoscopy refused 09/02/2015   ETOH abuse 09/02/2015   Hyperuricemia 05/27/2015   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   Left shoulder pain  04/25/2015   Allergic rhinitis 05/13/2014   GERD without esophagitis 04/01/2014   Osteoarthritis of left knee 12/11/2013   Home Medication(s) Prior to Admission medications   Medication Sig Start  Date End Date Taking? Authorizing Provider  allopurinol  (ZYLOPRIM ) 100 MG tablet Take 2 tablets (200 mg total) by mouth daily. 04/29/23   Carleen Chary, DO  atorvastatin  (LIPITOR) 20 MG tablet Take 1 tablet (20 mg total) by mouth daily. 05/03/23   Carleen Chary, DO  cetirizine  (ZYRTEC  ALLERGY) 10 MG tablet Take 1 tablet (10 mg total) by mouth daily. 05/26/23 05/25/24  Carleen Chary, DO  DULoxetine  (CYMBALTA ) 30 MG capsule Take 1 capsule (30 mg total) by mouth daily. 05/26/23 05/25/24  Carleen Chary, DO  ergocalciferol  (VITAMIN D2) 1.25 MG (50000 UT) capsule Take 1 capsule by mouth once a week. Every Wednesday starting 11/13 Patient not taking: Reported on 07/04/2023 04/29/23   Carleen Chary, DO  etodolac  (LODINE ) 400 MG tablet Take 1 tablet (400 mg total) by mouth 2 (two) times daily. 04/29/23   Carleen Chary, DO  feeding supplement (ENSURE ENLIVE / ENSURE PLUS) LIQD Take 237 mLs by mouth 3 (three) times daily between meals. Patient not taking: Reported on 07/04/2023 12/30/22   Jackolyn Masker, MD  fluticasone  (FLONASE ) 50 MCG/ACT nasal spray Use one spray in each nostril daily Patient not taking: Reported on 07/04/2023 05/27/23   Carleen Chary, DO  folic acid  (FOLVITE ) 1 MG tablet Take 1 tablet (1 mg total) by mouth daily. 05/03/23   Carleen Chary, DO  gabapentin  (NEURONTIN ) 100 MG capsule Take 1 capsule (100 mg total) by mouth 3 (three) times daily. 06/06/23 06/05/24  Aurora Lees, DO  levETIRAcetam  (KEPPRA ) 750 MG tablet Take 1 tablet (750 mg total) by mouth 2 (two) times daily. 05/03/23   Carleen Chary, DO  Multiple Vitamin (MULTIVITAMIN WITH MINERALS) TABS tablet Take 1 tablet by mouth daily. 05/03/23   Carleen Chary, DO  naltrexone (DEPADE) 50 MG tablet Take 1 tablet (50 mg total) by mouth daily. 07/14/23   Alexander-Savino, [place], MD  ondansetron  (ZOFRAN ) 4 MG tablet Take 1 tablet (4 mg total) by mouth every 8 (eight) hours as needed for nausea or vomiting.  05/03/23   Carleen Chary, DO  pantoprazole  (PROTONIX ) 40 MG tablet Take 1 tablet (40 mg total) by mouth 2 (two) times daily. 05/03/23   Carleen Chary, DO  thiamine  (VITAMIN B1) 100 MG tablet Take 1 tablet (100 mg total) by mouth daily. 07/14/23   Alexander-Savino, [place], MD  metFORMIN  (GLUCOPHAGE ) 500 MG tablet Take 0.5 tablets (250 mg total) by mouth daily with breakfast. 03/05/20 04/02/20  Hassie Lint, PA-C                                                                                                                                    Past Surgical History Past Surgical History:  Procedure Laterality Date   BIOPSY  06/03/2022   Procedure:  BIOPSY;  Surgeon: Elois Hair, MD;  Location: [facility] ENDOSCOPY;  Service: Gastroenterology;;   COLONOSCOPY N/A 06/21/2023   Procedure: COLONOSCOPY;  Surgeon: Nannette Babe, MD;  Location: [facility] ENDOSCOPY;  Service: Gastroenterology;  Laterality: N/A;   ESOPHAGOGASTRODUODENOSCOPY N/A 12/29/2022   Procedure: ESOPHAGOGASTRODUODENOSCOPY (EGD);  Surgeon: Nandigam, Kavitha V, MD;  Location: [facility] ENDOSCOPY;  Service: Gastroenterology;  Laterality: N/A;   ESOPHAGOGASTRODUODENOSCOPY N/A 06/21/2023   Procedure: EGD (ESOPHAGOGASTRODUODENOSCOPY);  Surgeon: Nannette Babe, MD;  Location: [facility] ENDOSCOPY;  Service: Gastroenterology;  Laterality: N/A;   ESOPHAGOGASTRODUODENOSCOPY (EGD) WITH PROPOFOL  N/A 06/03/2022   Procedure: ESOPHAGOGASTRODUODENOSCOPY (EGD) WITH PROPOFOL ;  Surgeon: Elois Hair, MD;  Location[facility] ENDOSCOPY;  Service: Gastroenterology;  Laterality: N/A;   HOT HEMOSTASIS N/A 06/03/2022   Procedure: HOT HEMOSTASIS (ARGON PLASMA COAGULATION/BICAP);  Surgeon: Elois Hair, MD;  Location: [facility] ENDOSCOPY;  Service: Gastroenterology;  Laterality: N/A;   Family History Family History  Problem Relation Age of Onset   CAD Mother     Social History Social History   Tobacco Use   Smoking status: Former    Current packs/day: 0.00    Types:  Cigarettes    Quit date: 06/25/2012    Years since quitting: 11.0   Smokeless tobacco: Never  Vaping Use   Vaping status: Never Used  Substance Use Topics   Alcohol use: Not Currently    Alcohol/week: 49.0 standard drinks of alcohol    Types: 14 Cans of beer, 35 Shots of liquor per week   Drug use: No   Allergies Losartan  potassium, Levaquin  [levofloxacin  in d5w], Ace inhibitors, Codeine , Cefepime , Chlorhexidine, and Vancomycin   Review of Systems Review of Systems  Gastrointestinal:  Positive for nausea and vomiting.  Neurological:  Positive for tremors.    Physical Exam Vital Signs  I have reviewed the triage vital signs BP 112/82 (BP Location: Right Arm)   Pulse (!) 123   Temp 98.3 F (36.8 C) (Oral)   Resp 15   Ht 5\' 2"  (1.575 m)   Wt 47.2 kg   LMP 08/23/2011   SpO2 100%   BMI 19.02 kg/m   Physical Exam Vitals and nursing note reviewed.  Constitutional:      General: She is not in acute distress.    Appearance: She is well-developed.  HENT:     Head: Normocephalic and atraumatic.  Eyes:     Conjunctiva/sclera: Conjunctivae normal.  Cardiovascular:     Rate and Rhythm: Regular rhythm. Tachycardia present.     Heart sounds: No murmur heard. Pulmonary:     Effort: Pulmonary effort is normal. No respiratory distress.     Breath sounds: Normal breath sounds.  Abdominal:     Palpations: Abdomen is soft.     Tenderness: There is no abdominal tenderness.  Musculoskeletal:        General: No swelling.     Cervical back: Neck supple.  Skin:    General: Skin is warm and dry.     Capillary Refill: Capillary refill takes less than 2 seconds.  Neurological:     Mental Status: She is alert.     Comments: Tremoring  Psychiatric:        Mood and Affect: Mood normal.     ED Results and Treatments Labs (all labs ordered are listed, but only abnormal results are displayed) Labs Reviewed  COMPREHENSIVE METABOLIC PANEL WITH GFR - Abnormal; Notable for the following  components:      Result Value   Chloride 117 (*)  CO2 10 (*)    Creatinine, Ser 1.03 (*)    Calcium  7.6 (*)    Total Protein 6.3 (*)    Albumin 2.6 (*)    AST 73 (*)    ALT 51 (*)    Alkaline Phosphatase 194 (*)    Anion gap 18 (*)    All other components within normal limits  ETHANOL - Abnormal; Notable for the following components:   Alcohol, Ethyl (B) 117 (*)    All other components within normal limits  CBC - Abnormal; Notable for the following components:   RBC 3.08 (*)    Hemoglobin 9.9 (*)    HCT 31.9 (*)    MCV 103.6 (*)    RDW 17.3 (*)    All other components within normal limits  LIPASE, BLOOD - Abnormal; Notable for the following components:   Lipase 86 (*)    All other components within normal limits  BETA-HYDROXYBUTYRIC ACID - Abnormal; Notable for the following components:   Beta-Hydroxybutyric Acid 0.73 (*)    All other components within normal limits  URINALYSIS, ROUTINE W REFLEX MICROSCOPIC - Abnormal; Notable for the following components:   APPearance HAZY (*)    All other components within normal limits  I-STAT VENOUS BLOOD GAS, ED - Abnormal; Notable for the following components:   pCO2, Ven 25.2 (*)    pO2, Ven 172 (*)    Bicarbonate 16.4 (*)    TCO2 17 (*)    Acid-base deficit 7.0 (*)    Calcium , Ion 1.00 (*)    HCT 28.0 (*)    Hemoglobin 9.5 (*)    All other components within normal limits  RAPID URINE DRUG SCREEN, HOSP PERFORMED  BLOOD GAS, VENOUS  I-STAT CG4 LACTIC ACID, ED  I-STAT CG4 LACTIC ACID, ED                                                                                                                          Radiology CT ABDOMEN PELVIS W CONTRAST Result Date: 07/20/2023 CLINICAL DATA:  Nausea and vomiting. EXAM: CT ABDOMEN AND PELVIS WITH CONTRAST TECHNIQUE: Multidetector CT imaging of the abdomen and pelvis was performed using the standard protocol following bolus administration of intravenous contrast. RADIATION DOSE  REDUCTION: This exam was performed according to the departmental dose-optimization program which includes automated exposure control, adjustment of the mA and/or kV according to patient size and/or use of iterative reconstruction technique. CONTRAST:  75mL OMNIPAQUE  IOHEXOL  350 MG/ML SOLN COMPARISON:  07/04/2023 FINDINGS: Lower chest: No acute findings. Hepatobiliary: The liver shows diffusely decreased attenuation suggesting fat deposition. No suspicious focal abnormality within the liver parenchyma. Layering tiny calcified gallstones identified in the fundal region (47/3). No intrahepatic or extrahepatic biliary dilation. Pancreas: No focal mass lesion. No dilatation of the main duct. No intraparenchymal cyst. No peripancreatic edema. Spleen: No splenomegaly. No suspicious focal mass lesion. Adrenals/Urinary Tract: Right adrenal gland unremarkable. Thickening of the left adrenal gland is similar to prior. Kidneys  unremarkable. No evidence for hydroureter. Bladder is nondistended. Stomach/Bowel: Stomach is unremarkable. No gastric wall thickening. No evidence of outlet obstruction. Duodenum is normally positioned as is the ligament of Treitz. No small bowel wall thickening. No small bowel dilatation. The terminal ileum is normal. The appendix is normal. No gross colonic mass. No colonic wall thickening. Vascular/Lymphatic: There is moderate atherosclerotic calcification of the abdominal aorta without aneurysm. There is no gastrohepatic or hepatoduodenal ligament lymphadenopathy. No retroperitoneal or mesenteric lymphadenopathy. No pelvic sidewall lymphadenopathy. Reproductive: Fibroids noted in the uterus. There is no adnexal mass. Other: No intraperitoneal free fluid. Musculoskeletal: No worrisome lytic or sclerotic osseous abnormality. Small epigastric midline ventral hernia approximately 2-3 cm cranial to the umbilicus contains only fat. IMPRESSION: 1. No acute findings in the abdomen or pelvis. Specifically, no  findings to explain the patient's history of nausea and vomiting. 2. The peripancreatic edema/inflammation seen on the previous study has resolved. Small volume free fluid seen on the previous study has also resolved in the interval. 3. Hepatic steatosis. 4. Cholelithiasis. 5. Uterine fibroids. 6.  Aortic Atherosclerosis (ICD10-I70.0). Electronically Signed   By: Donnal Fusi M.D.   On: 07/20/2023 05:40    Pertinent labs & imaging results that were available during my care of the patient were reviewed by me and considered in my medical decision making (see MDM for details).  Medications Ordered in ED Medications  LORazepam  (ATIVAN ) tablet 1-4 mg ( Oral See Alternative 07/20/23 0513)    Or  LORazepam  (ATIVAN ) injection 1-4 mg (2 mg Intravenous Given 07/20/23 0513)  thiamine  (VITAMIN B1) tablet 100 mg (has no administration in time range)    Or  thiamine  (VITAMIN B1) injection 100 mg (has no administration in time range)  folic acid  (FOLVITE ) tablet 1 mg (has no administration in time range)  multivitamin with minerals tablet 1 tablet (has no administration in time range)  sodium chloride  flush (NS) 0.9 % injection 10-40 mL (has no administration in time range)  lactated ringers  bolus 1,000 mL (0 mLs Intravenous Stopped 07/20/23 0623)  ondansetron  (ZOFRAN ) injection 4 mg (4 mg Intravenous Given 07/20/23 0351)  LORazepam  (ATIVAN ) injection 1 mg (1 mg Intravenous Given 07/20/23 0351)  iohexol  (OMNIPAQUE ) 350 MG/ML injection 75 mL (75 mLs Intravenous Contrast Given 07/20/23 0525)  lactated ringers  bolus 1,000 mL (1,000 mLs Intravenous New Bag/Given 07/20/23 2725)                                                                                                                                     Procedures Procedures  (including critical care time)  Medical Decision Making / ED Course   This patient presents to the ED for concern of nausea, vomiting, tremoring, this involves an extensive number of  treatment options, and is a complaint that carries with it a high risk of complications and morbidity.  The differential diagnosis includes alcohol withdrawal, dehydration, lactic acidosis, alcoholic ketosis, electrolyte  abnormality  MDM: Patient seen emergency room for evaluation of persistent vomiting and tremoring.  Physical exam reveals an ill-appearing tachycardic patient with upper extremity tremoring.  Patient initially hypertensive on arrival concerning for alcohol withdrawal and Ativan  administered.  Laboratory evaluation with hemoglobin of 9.9 with an MCV of one 3.6, bicarb is 10 but pH is compensated at 7.4, beta hydroxybutyrate minimally elevated at 0.73, lipase 86, AST 73, ALT 51, anion gap is 18, alcohol 117, lactic acid 4.29 . urinalysis unremarkable.  CT abdomen pelvis unremarkable.  Patient given fluid resuscitation and is persistently tachycardic.  Will require hospital admission for rehydration and alcohol withdrawal.   Additional history obtained: -Additional history obtained from husband -External records from outside source obtained and reviewed including: Chart review including previous notes, labs, imaging, consultation notes   Lab Tests: -I ordered, reviewed, and interpreted labs.   The pertinent results include:   Labs Reviewed  COMPREHENSIVE METABOLIC PANEL WITH GFR - Abnormal; Notable for the following components:      Result Value   Chloride 117 (*)    CO2 10 (*)    Creatinine, Ser 1.03 (*)    Calcium  7.6 (*)    Total Protein 6.3 (*)    Albumin 2.6 (*)    AST 73 (*)    ALT 51 (*)    Alkaline Phosphatase 194 (*)    Anion gap 18 (*)    All other components within normal limits  ETHANOL - Abnormal; Notable for the following components:   Alcohol, Ethyl (B) 117 (*)    All other components within normal limits  CBC - Abnormal; Notable for the following components:   RBC 3.08 (*)    Hemoglobin 9.9 (*)    HCT 31.9 (*)    MCV 103.6 (*)    RDW 17.3 (*)    All  other components within normal limits  LIPASE, BLOOD - Abnormal; Notable for the following components:   Lipase 86 (*)    All other components within normal limits  BETA-HYDROXYBUTYRIC ACID - Abnormal; Notable for the following components:   Beta-Hydroxybutyric Acid 0.73 (*)    All other components within normal limits  URINALYSIS, ROUTINE W REFLEX MICROSCOPIC - Abnormal; Notable for the following components:   APPearance HAZY (*)    All other components within normal limits  I-STAT VENOUS BLOOD GAS, ED - Abnormal; Notable for the following components:   pCO2, Ven 25.2 (*)    pO2, Ven 172 (*)    Bicarbonate 16.4 (*)    TCO2 17 (*)    Acid-base deficit 7.0 (*)    Calcium , Ion 1.00 (*)    HCT 28.0 (*)    Hemoglobin 9.5 (*)    All other components within normal limits  RAPID URINE DRUG SCREEN, HOSP PERFORMED  BLOOD GAS, VENOUS  I-STAT CG4 LACTIC ACID, ED  I-STAT CG4 LACTIC ACID, ED     Imaging Studies ordered: I ordered imaging studies including CT abdomen pelvis I independently visualized and interpreted imaging. I agree with the radiologist interpretation   Medicines ordered and prescription drug management: Meds ordered this encounter  Medications   lactated ringers  bolus 1,000 mL   ondansetron  (ZOFRAN ) injection 4 mg   LORazepam  (ATIVAN ) injection 1 mg   OR Linked Order Group    LORazepam  (ATIVAN ) tablet 1-4 mg     CIWA-AR < 5 =:   0 mg     CIWA-AR 5 -10 =:   1 mg     CIWA-AR 11 -  15 =:   2 mg     CIWA-AR 16 -20 =:   3 mg     CIWA-AR 16 -20 =:   Recheck CIWA-AR in 1 hour; if > 20 notify MD     CIWA-AR > 20 =:   4 mg     CIWA-AR > 20 =:   Call Rapid Response    LORazepam  (ATIVAN ) injection 1-4 mg     CIWA-AR < 5 =:   0 mg     CIWA-AR 5 -10 =:   1 mg     CIWA-AR 11 -15 =:   2 mg     CIWA-AR 16 -20 =:   3 mg     CIWA-AR 16 -20 =:   Recheck CIWA-AR in 1 hour; if > 20 notify MD     CIWA-AR > 20 =:   4 mg     CIWA-AR > 20 =:   Call Rapid Response   OR Linked Order  Group    thiamine  (VITAMIN B1) tablet 100 mg    thiamine  (VITAMIN B1) injection 100 mg   folic acid  (FOLVITE ) tablet 1 mg   multivitamin with minerals tablet 1 tablet   sodium chloride  flush (NS) 0.9 % injection 10-40 mL   iohexol  (OMNIPAQUE ) 350 MG/ML injection 75 mL   lactated ringers  bolus 1,000 mL    -I have reviewed the patients home medicines and have made adjustments as needed  Critical interventions none   Cardiac Monitoring: The patient was maintained on a cardiac monitor.  I personally viewed and interpreted the cardiac monitored which showed an underlying rhythm of: Sinus tachycardia  Social Determinants of Health:  Factors impacting patients care include: Persistent alcohol use   Reevaluation: After the interventions noted above, I reevaluated the patient and found that they have :improved  Co morbidities that complicate the patient evaluation  Past Medical History:  Diagnosis Date   GASTROESOPHAGEAL REFLUX, NO ESOPHAGITIS 04/21/2006   Qualifier: Diagnosis of  By: WATT, JOANNE     GERD (gastroesophageal reflux disease) Dx 1995   Hot flash, menopausal 06/22/2016   Hot flashes 01/30/2020   HTN (hypertension)    Hypotension 10/21/2022   Patient's blood pressure at presentation was 87/65, follow up blood pressure was 81/58. Patient reports regurgitating everything she has taken po for the past month. Denies lightheadedness or dizziness. Discussed GI referral, though patient stated she would be going to the emergency room today for IV fluids.   -GI referral for upper endoscopy placed  -Patient planning on going to the ED for IV flu   Intertrigo 06/22/2016   Nausea & vomiting 12/23/2022   Nausea and vomiting 12/24/2022   Pain and swelling of left ankle 12/02/2014   Seizures (HCC)    Tendinitis of right hip flexor 07/23/2014   Tendonitis, Achilles, right 04/01/2014      Dispostion: I considered admission for this patient, and patient require hospital admission for  alcohol withdrawal, lactic acidosis and persistent vomiting     Final Clinical Impression(s) / ED Diagnoses Final diagnoses:  Lactic acidosis  Dehydration  Nausea and vomiting, unspecified vomiting type  Alcohol withdrawal syndrome without complication [facility])     @PCDICTATION @    Karlyn Overman, MD 07/20/23 [phone]

## 2023-07-21 DIAGNOSIS — G9341 Metabolic encephalopathy: Secondary | ICD-10-CM | POA: Diagnosis not present

## 2023-07-21 DIAGNOSIS — I110 Hypertensive heart disease with heart failure: Secondary | ICD-10-CM | POA: Diagnosis not present

## 2023-07-21 DIAGNOSIS — F10139 Alcohol abuse with withdrawal, unspecified: Secondary | ICD-10-CM | POA: Diagnosis not present

## 2023-07-21 DIAGNOSIS — Z885 Allergy status to narcotic agent status: Secondary | ICD-10-CM | POA: Diagnosis not present

## 2023-07-21 DIAGNOSIS — D259 Leiomyoma of uterus, unspecified: Secondary | ICD-10-CM | POA: Diagnosis not present

## 2023-07-21 DIAGNOSIS — K439 Ventral hernia without obstruction or gangrene: Secondary | ICD-10-CM | POA: Diagnosis not present

## 2023-07-21 DIAGNOSIS — Z8249 Family history of ischemic heart disease and other diseases of the circulatory system: Secondary | ICD-10-CM | POA: Diagnosis not present

## 2023-07-21 DIAGNOSIS — D649 Anemia, unspecified: Secondary | ICD-10-CM | POA: Diagnosis not present

## 2023-07-21 DIAGNOSIS — R112 Nausea with vomiting, unspecified: Secondary | ICD-10-CM | POA: Diagnosis not present

## 2023-07-21 DIAGNOSIS — L03116 Cellulitis of left lower limb: Secondary | ICD-10-CM | POA: Diagnosis not present

## 2023-07-21 DIAGNOSIS — Z87891 Personal history of nicotine dependence: Secondary | ICD-10-CM | POA: Diagnosis not present

## 2023-07-21 DIAGNOSIS — F1090 Alcohol use, unspecified, uncomplicated: Secondary | ICD-10-CM | POA: Diagnosis not present

## 2023-07-21 DIAGNOSIS — E039 Hypothyroidism, unspecified: Secondary | ICD-10-CM | POA: Diagnosis not present

## 2023-07-21 DIAGNOSIS — K219 Gastro-esophageal reflux disease without esophagitis: Secondary | ICD-10-CM | POA: Diagnosis present

## 2023-07-21 DIAGNOSIS — E8729 Other acidosis: Secondary | ICD-10-CM | POA: Diagnosis not present

## 2023-07-21 DIAGNOSIS — G40909 Epilepsy, unspecified, not intractable, without status epilepticus: Secondary | ICD-10-CM | POA: Diagnosis not present

## 2023-07-21 DIAGNOSIS — E860 Dehydration: Secondary | ICD-10-CM | POA: Diagnosis not present

## 2023-07-21 DIAGNOSIS — I5032 Chronic diastolic (congestive) heart failure: Secondary | ICD-10-CM | POA: Diagnosis not present

## 2023-07-21 DIAGNOSIS — Z881 Allergy status to other antibiotic agents status: Secondary | ICD-10-CM | POA: Diagnosis not present

## 2023-07-21 DIAGNOSIS — E876 Hypokalemia: Secondary | ICD-10-CM | POA: Diagnosis not present

## 2023-07-21 DIAGNOSIS — Z888 Allergy status to other drugs, medicaments and biological substances status: Secondary | ICD-10-CM | POA: Diagnosis not present

## 2023-07-21 DIAGNOSIS — R339 Retention of urine, unspecified: Secondary | ICD-10-CM | POA: Diagnosis present

## 2023-07-21 DIAGNOSIS — M109 Gout, unspecified: Secondary | ICD-10-CM | POA: Diagnosis not present

## 2023-07-21 DIAGNOSIS — K709 Alcoholic liver disease, unspecified: Secondary | ICD-10-CM | POA: Diagnosis not present

## 2023-07-21 DIAGNOSIS — Z7984 Long term (current) use of oral hypoglycemic drugs: Secondary | ICD-10-CM | POA: Diagnosis not present

## 2023-07-21 DIAGNOSIS — N179 Acute kidney failure, unspecified: Secondary | ICD-10-CM | POA: Diagnosis not present

## 2023-07-21 DIAGNOSIS — Z79899 Other long term (current) drug therapy: Secondary | ICD-10-CM | POA: Diagnosis not present

## 2023-07-21 DIAGNOSIS — K8020 Calculus of gallbladder without cholecystitis without obstruction: Secondary | ICD-10-CM | POA: Diagnosis not present

## 2023-07-21 DIAGNOSIS — G4700 Insomnia, unspecified: Secondary | ICD-10-CM | POA: Diagnosis not present

## 2023-07-21 LAB — GLUCOSE, CAPILLARY
Glucose-Capillary: 101 mg/dL — ABNORMAL HIGH (ref 70–99)
Glucose-Capillary: 102 mg/dL — ABNORMAL HIGH (ref 70–99)
Glucose-Capillary: 135 mg/dL — ABNORMAL HIGH (ref 70–99)
Glucose-Capillary: 141 mg/dL — ABNORMAL HIGH (ref 70–99)
Glucose-Capillary: 143 mg/dL — ABNORMAL HIGH (ref 70–99)
Glucose-Capillary: 77 mg/dL (ref 70–99)
Glucose-Capillary: 93 mg/dL (ref 70–99)

## 2023-07-21 LAB — COMPREHENSIVE METABOLIC PANEL WITH GFR
ALT: 32 U/L (ref 0–44)
AST: 34 U/L (ref 15–41)
Albumin: 2 g/dL — ABNORMAL LOW (ref 3.5–5.0)
Alkaline Phosphatase: 132 U/L — ABNORMAL HIGH (ref 38–126)
Anion gap: 6 (ref 5–15)
BUN: 5 mg/dL — ABNORMAL LOW (ref 6–20)
CO2: 24 mmol/L (ref 22–32)
Calcium: 7.1 mg/dL — ABNORMAL LOW (ref 8.9–10.3)
Chloride: 110 mmol/L (ref 98–111)
Creatinine, Ser: 0.9 mg/dL (ref 0.44–1.00)
GFR, Estimated: >60 mL/min (ref >60)
Glucose, Bld: 78 mg/dL (ref 70–99)
Potassium: 3.5 mmol/L (ref 3.5–5.1)
Sodium: 140 mmol/L (ref 135–145)
Total Bilirubin: 0.9 mg/dL (ref 0.0–1.2)
Total Protein: 5.1 g/dL — ABNORMAL LOW (ref 6.5–8.1)

## 2023-07-21 LAB — CBC
HCT: 23.8 % — ABNORMAL LOW (ref 36.0–46.0)
Hemoglobin: 7.9 g/dL — ABNORMAL LOW (ref 12.0–15.0)
MCH: 32.1 pg (ref 26.0–34.0)
MCHC: 33.2 g/dL (ref 30.0–36.0)
MCV: 96.7 fL (ref 80.0–100.0)
Platelets: 218 10*3/uL (ref 150–400)
RBC: 2.46 MIL/uL — ABNORMAL LOW (ref 3.87–5.11)
RDW: 16.3 % — ABNORMAL HIGH (ref 11.5–15.5)
WBC: 10.5 10*3/uL (ref 4.0–10.5)
nRBC: 0.3 % — ABNORMAL HIGH (ref 0.0–0.2)

## 2023-07-21 LAB — PROTIME-INR
INR: 1.2 (ref 0.8–1.2)
Prothrombin Time: 15.1 s (ref 11.4–15.2)

## 2023-07-21 MED ORDER — DOXYCYCLINE HYCLATE 100 MG PO TABS
100.0000 mg | ORAL_TABLET | Freq: Two times a day (BID) | ORAL | Status: AC
  Administered 2023-07-22 – 2023-07-24 (×6): 100 mg via ORAL
  Filled 2023-07-21 (×6): qty 1

## 2023-07-21 MED ORDER — AMOXICILLIN-POT CLAVULANATE 875-125 MG PO TABS: 1.0000 | ORAL_TABLET | Freq: Two times a day (BID) | ORAL | Status: DC

## 2023-07-21 NOTE — Progress Notes (Signed)
 Physical Therapy Treatment Patient Details Name: Christina Byrd MRN: [id number] DOB: June 11, 1965 Today's Date: 07/21/2023   History of Present Illness 58 y.o. female presents to [facility] hospital on 07/19/2023 with tremors, nausea and vomiting. PMH includes alcohol abuse, alcoholic pancreatitis, seizure disorder, Wernicke's encephalopathy.    PT Comments  Pt resting in bed on arrival and agreeable to session. Pt awake and alert throughout session and demonstrating progress towards acute goals, however continues to be limited in safe mobility by decreased activity tolerance, poor balance/postural reactions and decreased insight into deficits. Pt was educated on continued walker use to maximize functional independence, safety, and decrease risk for falls however pt declined use this session, opting for IV pole support and requiring min A to maintain balance throughout. Pt requiring up to min A to complete transfers. Pt continues to be at increased risk for falls and will benefit from continued inpatient follow up therapy, <3 hours/day, will continue to follow acutely.     If plan is discharge home, recommend the following: A lot of help with walking and/or transfers;A lot of help with bathing/dressing/bathroom;Assistance with cooking/housework;Assist for transportation;Help with stairs or ramp for entrance   Can travel by private vehicle     Yes  Equipment Recommendations   (TBD pending progress)    Recommendations for Other Services       Precautions / Restrictions Precautions Precautions: Fall Recall of Precautions/Restrictions: Impaired Restrictions Weight Bearing Restrictions Per Provider Order: No     Mobility  Bed Mobility Overal bed mobility: Needs Assistance Bed Mobility: Supine to Sit     Supine to sit: HOB elevated, Contact guard, Used rails     General bed mobility comments: CGA for safety, + bed rail use    Transfers Overall transfer level: Needs assistance Equipment  used: 1 person hand held assist, None Transfers: Sit to/from Stand, Bed to chair/wheelchair/BSC Sit to Stand: Min assist           General transfer comment: min A to steady on rise and facilitate anterior weight shift    Ambulation/Gait Ambulation/Gait assistance: Min assist Gait Distance (Feet): 85 Feet Assistive device: IV Pole Gait Pattern/deviations: Drifts right/left, Decreased stride length Gait velocity: reduced     General Gait Details: some R/L drift in hall, min A to steady   Optometrist     Tilt Bed    Modified Rankin (Stroke Patients Only)       Balance Overall balance assessment: Needs assistance Sitting-balance support: No upper extremity supported, Feet supported Sitting balance-Leahy Scale: Fair     Standing balance support: Bilateral upper extremity supported, Reliant on assistive device for balance Standing balance-Leahy Scale: Poor                              Communication Communication Communication: No apparent difficulties  Cognition Arousal: Alert Behavior During Therapy: WFL for tasks assessed/performed   PT - Cognitive impairments: Safety/Judgement, Problem solving, Awareness                         Following commands: Impaired Following commands impaired: Follows one step commands with increased time    Cueing Cueing Techniques: Verbal cues, Tactile cues  Exercises Other Exercises Other Exercises: serial sit<>stands x3 with focus on utilizing momentum to shoft weight anterior to boost    General Comments General comments (skin  integrity, edema, etc.): tachycardia into 130's with activity      Pertinent Vitals/Pain Pain Assessment Pain Assessment: No/denies pain    Home Living                          Prior Function            PT Goals (current goals can now be found in the care plan section) Acute Rehab PT Goals Patient Stated Goal: to return to  independence PT Goal Formulation: With patient Time For Goal Achievement: 08/03/23 Progress towards PT goals: Progressing toward goals    Frequency    Min 2X/week      PT Plan      Co-evaluation              AM-PAC PT "6 Clicks" Mobility   Outcome Measure  Help needed turning from your back to your side while in a flat bed without using bedrails?: A Little Help needed moving from lying on your back to sitting on the side of a flat bed without using bedrails?: A Little Help needed moving to and from a bed to a chair (including a wheelchair)?: A Little Help needed standing up from a chair using your arms (e.g., wheelchair or bedside chair)?: A Little Help needed to walk in hospital room?: A Little Help needed climbing 3-5 steps with a railing? : Total 6 Click Score: 16    End of Session Equipment Utilized During Treatment: Gait belt Activity Tolerance: Patient tolerated treatment well Patient left: in bed;with call bell/phone within reach;with bed alarm set;with family/visitor present (seated up EOB with lunch set up) Nurse Communication: Mobility status PT Visit Diagnosis: Other abnormalities of gait and mobility (R26.89);Muscle weakness (generalized) (M62.81);History of falling (Z91.81)     Time: 7829-5621 PT Time Calculation (min) (ACUTE ONLY): 23 min  Charges:    $Gait Training: 23-37 mins PT General Charges $$ ACUTE PT VISIT: 1 Visit                     Verlin Uher R. PTA Acute Rehabilitation Services Office: [phone]   Agapito Horseman 07/21/2023, 2:25 PM

## 2023-07-21 NOTE — Evaluation (Signed)
 Occupational Therapy Evaluation Patient Details Name: Christina Byrd MRN: [id number] DOB: February 24, 1965 Today's Date: 07/21/2023   History of Present Illness   58 y.o. female presents to [facility] hospital on 07/19/2023 with tremors, nausea and vomiting. PMH includes alcohol abuse, alcoholic pancreatitis, seizure disorder, Wernicke's encephalopathy.     Clinical Impressions Sister in room and reports pt's cognition is at her baseline. Pt is typically independent in ambulation and ADLs. Presents with weakness and impaired standing balance. She needs up to min assist for ambulation and set up to min assist for ADLs. Patient will benefit from continued inpatient follow up therapy, <3 hours/day.     If plan is discharge home, recommend the following:   Assistance with cooking/housework;Assist for transportation;Help with stairs or ramp for entrance;A little help with walking and/or transfers;A little help with bathing/dressing/bathroom     Functional Status Assessment   Patient has had a recent decline in their functional status and demonstrates the ability to make significant improvements in function in a reasonable and predictable amount of time.     Equipment Recommendations   None recommended by OT     Recommendations for Other Services         Precautions/Restrictions   Precautions Precautions: Fall Recall of Precautions/Restrictions: Impaired Restrictions Weight Bearing Restrictions Per Provider Order: No     Mobility Bed Mobility Overal bed mobility: Modified Independent                  Transfers Overall transfer level: Needs assistance Equipment used: 1 person hand held assist, None Transfers: Sit to/from Stand Sit to Stand: Contact guard assist, Mod assist           General transfer comment: CGA from bed, mod from low toilet      Balance Overall balance assessment: Needs assistance   Sitting balance-Leahy Scale: Good     Standing balance  support: Single extremity supported Standing balance-Leahy Scale: Poor                             ADL either performed or assessed with clinical judgement   ADL Overall ADL's : Needs assistance/impaired Eating/Feeding: Set up;Sitting   Grooming: Contact guard assist;Standing;Wash/dry hands   Upper Body Bathing: Sitting;Supervision/ safety   Lower Body Bathing: Minimal assistance;Sit to/from stand   Upper Body Dressing : Sitting;Supervision/safety   Lower Body Dressing: Minimal assistance;Sit to/from stand   Toilet Transfer: Minimal assistance;Ambulation Toilet Transfer Details (indicate cue type and reason): hand held Toileting- Clothing Manipulation and Hygiene: Set up;Sitting/lateral lean       Functional mobility during ADLs: Minimal assistance       Vision Ability to See in Adequate Light: 0 Adequate Patient Visual Report: No change from baseline       Perception         Praxis         Pertinent Vitals/Pain Pain Assessment Pain Assessment: Faces Faces Pain Scale: Hurts little more Pain Location: B shoulders Pain Descriptors / Indicators: Grimacing, Discomfort Pain Intervention(s): Monitored during session     Extremity/Trunk Assessment Upper Extremity Assessment Upper Extremity Assessment: Right hand dominant RUE Deficits / Details: decreased shoulder ROM, pt reporting at baseline; generalized weakness LUE Deficits / Details: decreased shoulder ROM, pt reporting at baseline; generalized weakness   Lower Extremity Assessment Lower Extremity Assessment: Defer to PT evaluation   Cervical / Trunk Assessment Cervical / Trunk Assessment: Kyphotic   Communication Communication Communication: No apparent difficulties  Cognition Arousal: Alert Behavior During Therapy: WFL for tasks assessed/performed Cognition: No apparent impairments                               Following commands: Intact       Cueing  General  Comments   Cueing Techniques: Verbal cues  tachycardia into 130's with activity   Exercises     Shoulder Instructions      Home Living Family/patient expects to be discharged to:: Private residence Living Arrangements: Spouse/significant other Available Help at Discharge: Family;Available PRN/intermittently Type of Home: House Home Access: Ramped entrance Entrance Stairs-Number of Steps: 4 Entrance Stairs-Rails: Right;Left;Can reach both Home Layout: One level     Bathroom Shower/Tub: Chief Strategy Officer: Standard     Home Equipment: Agricultural consultant (2 wheels);BSC/3in1;Shower seat          Prior Functioning/Environment Prior Level of Function : Needs assist             Mobility Comments: pt reports ambulating unassisted at baseline, last admission notes multiple falls and pt reporting assistance from her spouse ADLs Comments: pt reports being independent in self care and IADLs    OT Problem List: Impaired balance (sitting and/or standing);Decreased strength   OT Treatment/Interventions: Self-care/ADL training;Therapeutic exercise;DME and/or AE instruction;Patient/family education;Balance training      OT Goals(Current goals can be found in the care plan section)   Acute Rehab OT Goals OT Goal Formulation: With patient Time For Goal Achievement: 08/04/23 Potential to Achieve Goals: Good   OT Frequency:  Min 2X/week    Co-evaluation              AM-PAC OT "6 Clicks" Daily Activity     Outcome Measure Help from another person eating meals?: None Help from another person taking care of personal grooming?: A Little Help from another person toileting, which includes using toliet, bedpan, or urinal?: A Lot Help from another person bathing (including washing, rinsing, drying)?: A Little Help from another person to put on and taking off regular upper body clothing?: A Little Help from another person to put on and taking off regular lower body  clothing?: A Little 6 Click Score: 18   End of Session Equipment Utilized During Treatment: Gait belt  Activity Tolerance: Patient tolerated treatment well Patient left: in bed;with call bell/phone within reach;with bed alarm set  OT Visit Diagnosis: Muscle weakness (generalized) (M62.81);Unsteadiness on feet (R26.81)                Time: 1451-1520 OT Time Calculation (min): 29 min Charges:  OT General Charges $OT Visit: 1 Visit OT Evaluation $OT Eval Moderate Complexity: 1 Mod OT Treatments $Self Care/Home Management : 8-22 mins  Avanell Leigh, OTR/L Acute Rehabilitation Services Office: [phone]   Jonette Nestle 07/21/2023, 3:46 PM

## 2023-07-21 NOTE — Progress Notes (Addendum)
 HD#0 Subjective:   Summary: Christina Byrd is a 58 y.o. 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  Overnight Events: Lactic acid improved after 6 L fluids overnight altered mental status resolved.  Patient states she came in for her neuropathy which is a chronic problem for her.  She endorses ongoing weakness, and 1 bout of emesis.  She continues to use alcohol at home.  Objective:  Vital signs in last 24 hours: Vitals:   07/21/23 0422 07/21/23 0522 07/21/23 0737 07/21/23 1216  BP: 115/77  124/76 128/84  Pulse: (!) 118 100 (!) 116 (!) 110  Resp: 16  16 16   Temp: 99.1 F (37.3 C)  99 F (37.2 C) 98.8 F (37.1 C)  TempSrc: Oral  Oral Oral  SpO2: 98% 98% 98% 99%  Weight:      Height:       Supplemental O2: Room Air SpO2: 99 %   Physical Exam:  Constitutional: No acute distress Cardiovascular: Tachycardic rate and rhythm, no m/r/g Pulmonary/Chest: normal work of breathing on room air, lungs clear to auscultation bilaterally Skin: warm and dry.  No signs of cellulitis compared to exam yesterday.  Lower extremities without edema. Psych: Appropriate affect.  Filed Weights   07/19/23 2051 07/20/23 1600  Weight: 47.2 kg 50.4 kg      Intake/Output Summary (Last 24 hours) at 07/21/2023 1404 Last data filed at 07/21/2023 0522 Gross per 24 hour  Intake 263.33 ml  Output 650 ml  Net -386.67 ml   Net IO Since Admission: 613.33 mL [07/21/23 1404]  Pertinent Labs:    Latest Ref Rng & Units 07/21/2023    4:55 AM 07/20/2023    2:56 PM 07/20/2023    3:45 AM  CBC  WBC 4.0 - 10.5 K/uL 10.5     Hemoglobin 12.0 - 15.0 g/dL 7.9  9.5  9.5   Hematocrit 36.0 - 46.0 % 23.8  28.0  28.0   Platelets 150 - 400 K/uL 218          Latest Ref Rng & Units 07/21/2023    4:55 AM 07/20/2023    2:56 PM 07/20/2023   11:23 AM  CMP  Glucose 70 - 99 mg/dL 78   97   BUN 6 - 20  mg/dL 5   6   Creatinine 1.61 - 1.00 mg/dL 0.96   0.45   Sodium 409 - 145 mmol/L 140  139  138   Potassium 3.5 - 5.1 mmol/L 3.5  3.8  4.1   Chloride 98 - 111 mmol/L 110   109   CO2 22 - 32 mmol/L 24   18   Calcium  8.9 - 10.3 mg/dL 7.1   7.2   Total Protein 6.5 - 8.1 g/dL 5.1     Total Bilirubin 0.0 - 1.2 mg/dL 0.9     Alkaline Phos 38 - 126 U/L 132     AST 15 - 41 U/L 34     ALT 0 - 44 U/L 32       Assessment/Plan:   Principal Problem:   Alcoholic ketoacidosis  Alcohol use disorder AGMA - Resolved  Lactic acidosis -resolved She is interested in alcohol cessation, was prescribed naltrexone  outpatient.  Discussed the risks of continued alcohol use with the patient including worsening neuropathy.  She is motivated to quit. -Daily BMP -PT/OT/SLP   Encephalopathy-resolved Now at baseline.  Likely multifactorial in  setting of metabolic derangement, vascular disease as evidenced by MRI 07/05/2023, intoxication, medication use with additional concerns for underlying Korsakoff's disease.  CT head without acute structural abnormality.  UDS negative. - Follow up blood cultures - Transitioned to doxy 100mg  BID for 5 day course starting tomorrow.  Left lower extremity cellulitis Greatly improved following IV antibiotics antibiotics, will receive second dose of Vanco and cefepime  today and then will transition to doxycycline  twice daily to complete a 5-day course. - Blood cultures pending, no growth to date   AKI Cr 1.03, up from baseline of 0.7 on admit, improved with volume resuscitation.  Endorses poor p.o. intake leading up to this admission - Trend renal function - Avoid nephrotoxins   Hx of Wernicke's encephalopathy Patient was treated with high dose thiamine  during her previous admission. Upon hospital follow up, the patient's husband did endorse that the patient confabulates, raising concern that she may have Korsakoff syndrome. - Continue thiamine  and folic acid  - Alcohol  cessation discussed, patient motivated to quit. - On naltrexone  outpatient, will continue at time of discharge   Sinus tachycardia Patient has chronic tachycardia with heart rate ranging in the 110s-120s during outpatient appointments. - Consider beta blocker if BP will tolerate   Hx of dysphagia Esophagram in April noted that she has poor esophageal contracting/dysmotility.  Refusing clear liquid diet at this time, will advance to full diet.  Instructed patient to chew thoroughly, drink fluids with meals, sit upright. - Advance to regular diet today   Normocytic anemia Hb stable, slightly downtrending today likely secondary to dilution in setting of large volume resuscitation - Daily CBC   Seizure disorder On keppra  750 mg BID - continue home med.   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  Diet: Normal IVF: None,None VTE: DOAC Code: Full PT/OT recs: SNF for Subacute PT.  Dispo: Anticipated discharge to Skilled nursing facility pending insurance authorization, medically ready for discharge today.  [name] MD Internal Medicine Resident PGY-1 Pager: [phone] Please contact the on call pager after 5 pm and on weekends at [phone].

## 2023-07-21 NOTE — TOC Initial Note (Signed)
 Transition of Care [facility]) - Initial/Assessment Note    Patient Details  Name: Christina Byrd MRN: [id number] Date of Birth: Jan 21, 1966  Transition of Care [facility]) CM/SW Contact:    Carmon Christen, LCSWA Phone Number: 07/21/2023, 2:45 PM  Clinical Narrative:                  CSW received consult for possible SNF placement at time of discharge. CSW spoke with patient regarding PT recommendation of SNF placement at time of discharge. Patient reports PTA she comes from home with significant other. Patient expressed understanding of PT recommendation and is agreeable to SNF placement at time of discharge. Patient gave CSW permission to fax out initial referral for possible SNF placement.CSW discussed insurance authorization process.No further questions reported at this time. CSW to continue to follow and assist with discharge planning needs.        Patient Goals and CMS Choice            Expected Discharge Plan and Services                                              Prior Living Arrangements/Services                       Activities of Daily Living      Permission Sought/Granted                  Emotional Assessment              Admission diagnosis:  Dehydration [E86.0] Lactic acidosis [E87.20] Alcoholic ketoacidosis [E87.29] Alcohol withdrawal syndrome without complication (HCC) [F10.930] Nausea and vomiting, unspecified vomiting type [R11.2] Patient Active Problem List   Diagnosis Date Noted   Alcoholic ketoacidosis 07/20/2023   Wernicke encephalopathy 07/16/2023   Acute pancreatitis 07/05/2023   Encephalopathy 07/04/2023   Elevated liver enzymes 07/04/2023   Falls frequently 07/04/2023   Abdominal pain 07/04/2023   Weakness 07/04/2023   Metabolic acidosis 07/04/2023   Physical deconditioning 06/22/2023   History of non anemic vitamin B12 deficiency 06/20/2023   Dehydration 06/18/2023   Aspiration pneumonia of left lower lobe  (HCC) 06/18/2023   AKI (acute kidney injury) (HCC) 06/13/2023   Aortic atherosclerosis (HCC) 05/26/2023   Nausea & vomiting 05/03/2023   Alcoholic peripheral neuropathy (HCC) 05/03/2023   Breakthrough seizure (HCC) 01/23/2023   Localized skin desquamation 01/06/2023   Hypothyroidism 01/06/2023   Vitamin D  deficiency 01/06/2023   CKD (chronic kidney disease) stage 3, GFR 30-59 ml/min (HCC) 01/06/2023   Abnormal LFTs 12/28/2022   Chronic diarrhea 12/28/2022   Heme positive stool 12/28/2022   Protein-calorie malnutrition, severe (HCC) 12/27/2022   Electrolyte abnormality 12/26/2022   Hypocalcemia 11/19/2022   Hypoalbuminemia due to protein-calorie malnutrition (HCC) 11/19/2022   Hyperlipidemia 11/19/2022   Elevated lipase 11/19/2022   Osteoarthritis 09/02/2022   History of noncompliance with medical treatment 07/27/2022   Hepatic steatosis 07/27/2022   Acute pain of right knee 07/08/2022   Transaminitis 07/08/2022   Normocytic anemia 07/08/2022   Alcohol withdrawal syndrome without complication (HCC) 07/07/2022   Duodenal ulcer hemorrhage 06/03/2022   AVM (arteriovenous malformation) of stomach, 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   Anemia due to chronic blood loss 06/03/2022   Dysphagia 03/28/2020   Cervicalgia 03/28/2020   Hot flashes 01/30/2020   Idiopathic chronic gout of right knee without tophus  01/30/2020   Depression 07/08/2016   Seizure disorder (HCC) 07/08/2016   Cocaine use    Gout of left ankle 10/09/2015   Left hip pain 09/02/2015   Colonoscopy refused 09/02/2015   ETOH abuse 09/02/2015   Hyperuricemia 05/27/2015   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   Left shoulder pain 04/25/2015   Allergic rhinitis 05/13/2014   GERD without esophagitis 04/01/2014   Osteoarthritis of left knee 12/11/2013   PCP:  Carleen Chary, DO Pharmacy:   [id number] [place], [place] - [address] AT [facility] OF [address] [place] [address] Phone: [phone] Fax: [phone]  [id number] - [place], [place] - [address] AT [facility] OF [address] [facility] [address] [place] [address] Phone: [phone] Fax: [phone]  [facility] [address] [place] [address] Phone: [phone] Fax: [phone]     Social Drivers of Health (SDOH) Social History: SDOH Screenings   Food Insecurity: No Food Insecurity (07/20/2023)  Housing: Low Risk  (07/20/2023)  Transportation Needs: No Transportation Needs (07/20/2023)  Utilities: Not At Risk (07/20/2023)  Depression (PHQ2-9): Low Risk  (06/30/2023)  Tobacco Use: Medium Risk (07/19/2023)   SDOH Interventions:     Readmission Risk Interventions    06/22/2023    2:16 PM 11/21/2022    1:01 PM  Readmission Risk Prevention Plan  Transportation Screening Complete Complete  Medication Review Oceanographer) Complete Complete  PCP or Specialist appointment within 3-5 days of discharge Complete Complete  HRI or Home Care Consult Complete Complete  SW Recovery Care/Counseling Consult Complete   Palliative Care Screening Not Applicable Not Applicable  Skilled Nursing Facility Patient Refused Not Applicable

## 2023-07-21 NOTE — [place] FL2 (Signed)
 [place]  MEDICAID FL2 LEVEL OF CARE FORM     IDENTIFICATION  Patient Name: Christina Byrd Birthdate: 08-Aug-1965 Sex: female Admission Date (Current Location): 07/19/2023  [facility] and [place] Number:  Producer, television/film/video and Address:  [facility] [place], [address], [place], [place] [address]      Provider Number: [id number]  Attending Physician Name and Address:  Sherol Dixie, MD  Relative Name and Phone Number:  Travis Friedman (significant other) [phone]    Current Level of Care: Hospital Recommended Level of Care: Skilled Nursing Facility Prior Approval Number:    Date Approved/Denied:   PASRR Number: [id number]  Discharge Plan: SNF    Current Diagnoses: Patient Active Problem List   Diagnosis Date Noted   Alcoholic ketoacidosis 07/20/2023   Wernicke encephalopathy 07/16/2023   Acute pancreatitis 07/05/2023   Encephalopathy 07/04/2023   Elevated liver enzymes 07/04/2023   Falls frequently 07/04/2023   Abdominal pain 07/04/2023   Weakness 07/04/2023   Metabolic acidosis 07/04/2023   Physical deconditioning 06/22/2023   History of non anemic vitamin B12 deficiency 06/20/2023   Dehydration 06/18/2023   Aspiration pneumonia of left lower lobe (HCC) 06/18/2023   AKI (acute kidney injury) (HCC) 06/13/2023   Aortic atherosclerosis (HCC) 05/26/2023   Nausea & vomiting 05/03/2023   Alcoholic peripheral neuropathy (HCC) 05/03/2023   Breakthrough seizure (HCC) 01/23/2023   Localized skin desquamation 01/06/2023   Hypothyroidism 01/06/2023   Vitamin D  deficiency 01/06/2023   CKD (chronic kidney disease) stage 3, GFR 30-59 ml/min (HCC) 01/06/2023   Abnormal LFTs 12/28/2022   Chronic diarrhea 12/28/2022   Heme positive stool 12/28/2022   Protein-calorie malnutrition, severe (HCC) 12/27/2022   Electrolyte abnormality 12/26/2022   Hypocalcemia 11/19/2022   Hypoalbuminemia due to protein-calorie malnutrition (HCC) 11/19/2022    Hyperlipidemia 11/19/2022   Elevated lipase 11/19/2022   Osteoarthritis 09/02/2022   History of noncompliance with medical treatment 07/27/2022   Hepatic steatosis 07/27/2022   Acute pain of right knee 07/08/2022   Transaminitis 07/08/2022   Normocytic anemia 07/08/2022   Alcohol withdrawal syndrome without complication (HCC) 07/07/2022   Duodenal ulcer hemorrhage 06/03/2022   AVM (arteriovenous malformation) of stomach, 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   Anemia due to chronic blood loss 06/03/2022   Dysphagia 03/28/2020   Cervicalgia 03/28/2020   Hot flashes 01/30/2020   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   Depression 07/08/2016   Seizure disorder (HCC) 07/08/2016   Cocaine use    Gout of left ankle 10/09/2015   Left hip pain 09/02/2015   Colonoscopy refused 09/02/2015   ETOH abuse 09/02/2015   Hyperuricemia 05/27/2015   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   Left shoulder pain 04/25/2015   Allergic rhinitis 05/13/2014   GERD without esophagitis 04/01/2014   Osteoarthritis of left knee 12/11/2013    Orientation RESPIRATION BLADDER Height & Weight     Self, Time, Situation, Place  Normal Continent Weight: 111 lb 1.8 oz (50.4 kg) Height:  5\' 2"  (157.5 cm)  BEHAVIORAL SYMPTOMS/MOOD NEUROLOGICAL BOWEL NUTRITION STATUS      Continent Diet (Please see discharge summary)  AMBULATORY STATUS COMMUNICATION OF NEEDS Skin   Limited Assist Verbally Normal                       Personal Care Assistance Level of Assistance  Feeding Bathing Assistance: Maximum assistance Feeding assistance: Independent Dressing Assistance: Maximum assistance     Functional Limitations Info  Sight  Sight Info: Impaired Hearing Info: Adequate Speech Info: Adequate    SPECIAL CARE FACTORS FREQUENCY  PT (By licensed PT), OT (By licensed OT)     PT Frequency: 5x min weekly OT Frequency: 5x min weekly            Contractures Contractures  Info: Not present    Additional Factors Info  Code Status, Allergies, Psychotropic Code Status Info: FULL Allergies Info: Losartan  Potassium,Levaquin  (levofloxacin  In D5w),Ace Inhibitors,Codeine ,Cefepime ,Chlorhexidine,Vancomycin  Psychotropic Info: gabapentin  (NEURONTIN ) capsule 100 mg 3 times daily, levETIRAcetam  (KEPPRA ) tablet 750 mg 2 times daily         Current Medications (07/21/2023):  This is the current hospital active medication list Current Facility-Administered Medications  Medication Dose Route Frequency Provider Last Rate Last Admin   acetaminophen  (TYLENOL ) tablet 650 mg  650 mg Oral Q6H PRN Atway, Rayann N, DO       Or   acetaminophen  (TYLENOL ) suppository 650 mg  650 mg Rectal Q6H PRN Atway, Rayann N, DO       allopurinol  (ZYLOPRIM ) tablet 200 mg  200 mg Oral Daily Atway, Rayann N, DO   200 mg at 07/21/23 0913   atorvastatin  (LIPITOR) tablet 20 mg  20 mg Oral Daily Atway, Rayann N, DO   20 mg at 07/21/23 0913   [START ON 07/22/2023] doxycycline  (VIBRA -TABS) tablet 100 mg  100 mg Oral Q12H [name], MD       DULoxetine  (CYMBALTA ) DR capsule 30 mg  30 mg Oral Daily Atway, Rayann N, DO   30 mg at 07/21/23 0913   enoxaparin  (LOVENOX ) injection 40 mg  40 mg Subcutaneous Q24H Williams, Julie Anne, MD   40 mg at 07/20/23 2136   folic acid  (FOLVITE ) tablet 1 mg  1 mg Oral Daily Kommor, Madison, MD   1 mg at 07/21/23 1610   gabapentin  (NEURONTIN ) capsule 100 mg  100 mg Oral TID Atway, Rayann N, DO   100 mg at 07/21/23 9604   levETIRAcetam  (KEPPRA ) tablet 750 mg  750 mg Oral BID Atway, Rayann N, DO   750 mg at 07/21/23 5409   LORazepam  (ATIVAN ) tablet 1-4 mg  1-4 mg Oral Q1H PRN Kommor, Madison, MD       Or   LORazepam  (ATIVAN ) injection 1-4 mg  1-4 mg Intravenous Q1H PRN Kommor, Madison, MD   2 mg at 07/20/23 0513   multivitamin with minerals tablet 1 tablet  1 tablet Oral Daily Kommor, Madison, MD   1 tablet at 07/21/23 0913   pantoprazole  (PROTONIX ) EC tablet 40 mg  40  mg Oral BID Atway, Rayann N, DO   40 mg at 07/21/23 8119   sodium chloride  flush (NS) 0.9 % injection 10-40 mL  10-40 mL Intracatheter PRN Kommor, Madison, MD       thiamine  (VITAMIN B1) tablet 100 mg  100 mg Oral Daily Kommor, Madison, MD   100 mg at 07/21/23 1478   Or   thiamine  (VITAMIN B1) injection 100 mg  100 mg Intravenous Daily Kommor, Madison, MD         Discharge Medications: Please see discharge summary for a list of discharge medications.  Relevant Imaging Results:  Relevant Lab Results:   Additional Information [id number]  Carmon Christen, LCSWA

## 2023-07-22 DIAGNOSIS — E8729 Other acidosis: Secondary | ICD-10-CM | POA: Diagnosis not present

## 2023-07-22 LAB — COMPREHENSIVE METABOLIC PANEL WITH GFR
ALT: 26 U/L (ref 0–44)
AST: 26 U/L (ref 15–41)
Albumin: 1.9 g/dL — ABNORMAL LOW (ref 3.5–5.0)
Alkaline Phosphatase: 114 U/L (ref 38–126)
Anion gap: 8 (ref 5–15)
BUN: 5 mg/dL — ABNORMAL LOW (ref 6–20)
CO2: 22 mmol/L (ref 22–32)
Calcium: 6.9 mg/dL — ABNORMAL LOW (ref 8.9–10.3)
Chloride: 108 mmol/L (ref 98–111)
Creatinine, Ser: 0.91 mg/dL (ref 0.44–1.00)
GFR, Estimated: >60 mL/min (ref >60)
Glucose, Bld: 89 mg/dL (ref 70–99)
Potassium: 3 mmol/L — ABNORMAL LOW (ref 3.5–5.1)
Sodium: 138 mmol/L (ref 135–145)
Total Bilirubin: 0.3 mg/dL (ref 0.0–1.2)
Total Protein: 4.8 g/dL — ABNORMAL LOW (ref 6.5–8.1)

## 2023-07-22 LAB — CBC
HCT: 22 % — ABNORMAL LOW (ref 36.0–46.0)
Hemoglobin: 7.2 g/dL — ABNORMAL LOW (ref 12.0–15.0)
MCH: 32 pg (ref 26.0–34.0)
MCHC: 32.7 g/dL (ref 30.0–36.0)
MCV: 97.8 fL (ref 80.0–100.0)
Platelets: 160 10*3/uL (ref 150–400)
RBC: 2.25 MIL/uL — ABNORMAL LOW (ref 3.87–5.11)
RDW: 16.2 % — ABNORMAL HIGH (ref 11.5–15.5)
WBC: 7.5 10*3/uL (ref 4.0–10.5)
nRBC: 0.4 % — ABNORMAL HIGH (ref 0.0–0.2)

## 2023-07-22 LAB — GLUCOSE, CAPILLARY
Glucose-Capillary: 130 mg/dL — ABNORMAL HIGH (ref 70–99)
Glucose-Capillary: 77 mg/dL (ref 70–99)
Glucose-Capillary: 76 mg/dL (ref 70–99)
Glucose-Capillary: 90 mg/dL (ref 70–99)

## 2023-07-22 LAB — MAGNESIUM: Magnesium: 1.3 mg/dL — ABNORMAL LOW (ref 1.7–2.4)

## 2023-07-22 MED ORDER — RIVAROXABAN 10 MG PO TABS
10.0000 mg | ORAL_TABLET | Freq: Every day | ORAL | Status: DC
  Administered 2023-07-22 – 2023-07-27 (×6): 10 mg via ORAL
  Filled 2023-07-22 (×6): qty 1

## 2023-07-22 MED ORDER — MAGNESIUM SULFATE 4 GM/100ML IV SOLN
4.0000 g | Freq: Once | INTRAVENOUS | Status: AC
  Administered 2023-07-22: 4 g via INTRAVENOUS
  Filled 2023-07-22: qty 100

## 2023-07-22 MED ORDER — POTASSIUM CHLORIDE 20 MEQ PO PACK
40.0000 meq | PACK | Freq: Two times a day (BID) | ORAL | Status: AC
  Administered 2023-07-22 (×2): 40 meq via ORAL
  Filled 2023-07-22 (×2): qty 2

## 2023-07-22 NOTE — Plan of Care (Signed)
  Problem: Education: Goal: Knowledge of General Education information will improve Description: Including pain rating scale, medication(s)/side effects and non-pharmacologic comfort measures Outcome: Progressing   Problem: Health Behavior/Discharge Planning: Goal: Ability to manage health-related needs will improve Outcome: Progressing   Problem: Clinical Measurements: Goal: Ability to maintain clinical measurements within normal limits will improve Outcome: Progressing Goal: Will remain free from infection Outcome: Progressing Goal: Diagnostic test results will improve Outcome: Progressing Goal: Respiratory complications will improve Outcome: Progressing   Problem: Activity: Goal: Risk for activity intolerance will decrease Outcome: Progressing   Problem: Nutrition: Goal: Adequate nutrition will be maintained Outcome: Progressing   Problem: Coping: Goal: Level of anxiety will decrease Outcome: Progressing   Problem: Elimination: Goal: Will not experience complications related to bowel motility Outcome: Progressing Goal: Will not experience complications related to urinary retention Outcome: Progressing   Problem: Pain Managment: Goal: General experience of comfort will improve and/or be controlled Outcome: Progressing   Problem: Safety: Goal: Ability to remain free from injury will improve Outcome: Progressing   Problem: Skin Integrity: Goal: Risk for impaired skin integrity will decrease Outcome: Progressing   Problem: Clinical Measurements: Goal: Cardiovascular complication will be avoided Outcome: Not Progressing

## 2023-07-22 NOTE — Progress Notes (Signed)
 HD#1 Subjective:   Summary: Christina Byrd is a 58 y.o. 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  Overnight Events:   Feeling better - strength does seem to wax and wane. Was able to walk around the hall yesterday but today legs feel heavy. She states she is done drinking, wants to go to SNF for PT and strengthening. No aspiration when eating.   Objective:  Vital signs in last 24 hours: Vitals:   07/21/23 2028 07/21/23 2346 07/22/23 0317 07/22/23 0907  BP: (!) 148/95 133/81 (!) 138/95 (!) 140/91  Pulse: (!) 104 (!) 105 100 100  Resp: 20 18 20 18   Temp: 98 F (36.7 C) 98.2 F (36.8 C) 98.3 F (36.8 C) 99.3 F (37.4 C)  TempSrc: Oral Oral Oral Oral  SpO2: 100% 100% 99% 100%  Weight:      Height:       Supplemental O2: Room Air SpO2: 100 %   Physical Exam:  Constitutional: No acute distress Cardiovascular: Tachycardic rate and rhythm, no m/r/g Pulmonary/Chest: normal work of breathing on room air, lungs clear to auscultation bilaterally Skin: warm and dry.  No signs of cellulitis compared to exam yesterday.  Lower extremities without edema. Psych: Appropriate affect.  Filed Weights   07/19/23 2051 07/20/23 1600  Weight: 47.2 kg 50.4 kg      Intake/Output Summary (Last 24 hours) at 07/22/2023 1123 Last data filed at 07/21/2023 1553 Gross per 24 hour  Intake 360 ml  Output --  Net 360 ml   Net IO Since Admission: 1,273.33 mL [07/22/23 1123]  Pertinent Labs:    Latest Ref Rng & Units 07/22/2023    5:00 AM 07/21/2023    4:55 AM 07/20/2023    2:56 PM  CBC  WBC 4.0 - 10.5 K/uL 7.5  10.5    Hemoglobin 12.0 - 15.0 g/dL 7.2  7.9  9.5   Hematocrit 36.0 - 46.0 % 22.0  23.8  28.0   Platelets 150 - 400 K/uL 160  218         Latest Ref Rng & Units 07/22/2023    5:00 AM 07/21/2023    4:55 AM 07/20/2023    2:56 PM  CMP  Glucose 70 - 99 mg/dL 89  78     BUN 6 - 20 mg/dL 5  5    Creatinine 8.84 - 1.00 mg/dL 1.66  0.63    Sodium 016 - 145 mmol/L 138  140  139   Potassium 3.5 - 5.1 mmol/L 3.0  3.5  3.8   Chloride 98 - 111 mmol/L 108  110    CO2 22 - 32 mmol/L 22  24    Calcium  8.9 - 10.3 mg/dL 6.9  7.1    Total Protein 6.5 - 8.1 g/dL 4.8  5.1    Total Bilirubin 0.0 - 1.2 mg/dL 0.3  0.9    Alkaline Phos 38 - 126 U/L 114  132    AST 15 - 41 U/L 26  34    ALT 0 - 44 U/L 26  32      Assessment/Plan:   Principal Problem:   Alcoholic ketoacidosis  Alcohol use disorder AGMA - Resolved  Lactic acidosis -resolved She is interested in alcohol cessation, was prescribed naltrexone outpatient.  Discussed the risks of continued alcohol use with the patient including worsening neuropathy.  She is motivated to quit. -Daily BMP -  PT/OT/SLP   Encephalopathy-resolved Now at baseline.  Likely multifactorial in setting of metabolic derangement, vascular disease as evidenced by MRI 07/05/2023, intoxication, medication use with additional concerns for underlying Korsakoff's disease.  CT head without acute structural abnormality.  UDS negative. - Follow up blood cultures - Transitioned to doxy 100mg  BID for 5 day course.   Hypokalemia Hypomagnesemia  Likely 2-2 poor po intake and alcohol use leading up to admission - Daily BMP, Mg - Replete as needed   Left lower extremity cellulitis Greatly improved following antibiotics, will transition to doxycycline  twice daily to complete a 5-day course. - Blood cultures pending, no growth to date   AKI Cr 1.03, up from baseline of 0.7 on admit, improved with volume resuscitation.  Endorses poor p.o. intake leading up to this admission - Trend renal function - Avoid nephrotoxins   Hx of Wernicke's encephalopathy Patient was treated with high dose thiamine  during her previous admission. Upon hospital follow up, the patient's husband did endorse that the patient confabulates, raising concern that she may have  Korsakoff syndrome. - Continue thiamine  and folic acid  - Alcohol cessation discussed, patient motivated to quit. - On naltrexone outpatient, will continue at time of discharge   Sinus tachycardia Patient has chronic tachycardia with heart rate ranging in the 110s-120s during outpatient appointments. - Consider beta blocker if BP will tolerate   Hx of dysphagia Esophagram in April noted that she has poor esophageal contracting/dysmotility.  Refusing clear liquid diet at this time, will advance to full diet.  Instructed patient to chew thoroughly, drink fluids with meals, sit upright. - Doing well with diet   Normocytic anemia Hb stable, slightly downtrending today likely secondary to dilution in setting of large volume resuscitation - Daily CBC   Seizure disorder On keppra  750 mg BID - continue home med.   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  Diet: Normal IVF: None,None VTE: DOAC Code: Full PT/OT recs: SNF for Subacute PT.  Dispo: Anticipated discharge to Skilled nursing facility pending insurance authorization, medically ready for discharge today.  [name] MD Internal Medicine Resident PGY-1 Pager: [phone] Please contact the on call pager after 5 pm and on weekends at [phone].

## 2023-07-22 NOTE — Progress Notes (Signed)
  Progress Note   Date: 07/22/2023  Patient Name: Christina Byrd        MRN#: [id number]   The diagnosis of sepsis    sepsis has been ruled out

## 2023-07-22 NOTE — TOC Progression Note (Addendum)
 Transition of Care [facility]) - Progression Note    Patient Details  Name: Christina Byrd MRN: [id number] Date of Birth: 08/02/1965  [facility] CM/SW Contact  Carmon Christen, LCSWA Phone Number: 07/22/2023, 1:30 PM  Clinical Narrative:     CSW provided SNF bed offer to patient. Patient had some questions for facility regarding her insurance before accepting SNF bed offer. CSW awaiting call back from [place] with [facility]. CSW will continue to follow.  Update- [name] with [facility] informed CSW they have no beds available right now and that may not have any snf beds until next Friday. Currently patient has no other SNF bed offers. CSW informed patient. Patient gave CSW permission to expand SNF search.CSW expanded SNF search.  Expected Discharge Plan: Skilled Nursing Facility Barriers to Discharge: Continued Medical Work up  Expected Discharge Plan and Services In-house Referral: Clinical Social Work     Living arrangements for the past 2 months: Single Family Home                                       Social Determinants of Health (SDOH) Interventions SDOH Screenings   Food Insecurity: No Food Insecurity (07/20/2023)  Housing: Low Risk  (07/20/2023)  Transportation Needs: No Transportation Needs (07/20/2023)  Utilities: Not At Risk (07/20/2023)  Depression (PHQ2-9): Low Risk  (06/30/2023)  Tobacco Use: Medium Risk (07/19/2023)    Readmission Risk Interventions    06/22/2023    2:16 PM 11/21/2022    1:01 PM  Readmission Risk Prevention Plan  Transportation Screening Complete Complete  Medication Review (RN Care Manager) Complete Complete  PCP or Specialist appointment within 3-5 days of discharge Complete Complete  HRI or Home Care Consult Complete Complete  SW Recovery Care/Counseling Consult Complete   Palliative Care Screening Not Applicable Not Applicable  Skilled Nursing Facility Patient Refused Not Applicable

## 2023-07-22 NOTE — Progress Notes (Signed)
 Physical Therapy Treatment Patient Details Name: Christina Byrd MRN: [id number] DOB: 09-29-1965 Today's Date: 07/22/2023   History of Present Illness 58 y.o. female [facility] to [facility] hospital on 07/19/2023 with tremors, nausea and vomiting. PMH includes alcohol abuse, alcoholic pancreatitis, seizure disorder, Wernicke's encephalopathy.    PT Comments  Reasons for admission have improved, pt's mentation improved, but don't know her baseline.  Emphassis on standing exercise for Bil LE weakness and gait with work on balance with less control imposed on the AD.    If plan is discharge home, recommend the following: A lot of help with walking and/or transfers;A lot of help with bathing/dressing/bathroom;Assistance with cooking/housework;Assist for transportation;Help with stairs or ramp for entrance   Can travel by private vehicle     Yes  Equipment Recommendations  Rolling walker (2 wheels);BSC/3in1;Wheelchair (measurements PT);Wheelchair cushion (measurements PT)    Recommendations for Other Services       Precautions / Restrictions Precautions Precautions: Fall Recall of Precautions/Restrictions: Intact     Mobility  Bed Mobility Overal bed mobility: Modified Independent                  Transfers Overall transfer level: Needs assistance   Transfers: Sit to/from Stand Sit to Stand: Contact guard assist           General transfer comment: cues for hand placement while weaker and deconditioned    Ambulation/Gait Ambulation/Gait assistance: Contact guard assist Gait Distance (Feet): 250 Feet Assistive device: IV Pole Gait Pattern/deviations: Decreased step length - right, Decreased step length - left, Decreased stride length, Drifts right/left       General Gait Details: mild instability with mild strength deficits both proximal and distal.  Wanders when the iv pole is not stabilized.   Stairs             Wheelchair Mobility     Tilt Bed     Modified Rankin (Stroke Patients Only)       Balance Overall balance assessment: Needs assistance Sitting-balance support: No upper extremity supported, Feet supported, Feet unsupported Sitting balance-Leahy Scale: Good Sitting balance - Comments: sitting EOB   Standing balance support: Single extremity supported Standing balance-Leahy Scale: Poor                              Communication    Cognition Arousal: Alert Behavior During Therapy: WFL for tasks assessed/performed   PT - Cognitive impairments: No apparent impairments                       PT - Cognition Comments: improved cog, though baseline unknown        Cueing    Exercises Other Exercises Other Exercises: standing hip flexion/marching bil x10  support on chair back Other Exercises: hip abduction bil x10 reps.    General Comments        Pertinent Vitals/Pain Pain Assessment Pain Assessment: Faces Faces Pain Scale: Hurts little more Pain Location: hands and feet--sounds like neuropathy Pain Descriptors / Indicators: Discomfort, Grimacing Pain Intervention(s): Monitored during session    Home Living                          Prior Function            PT Goals (current goals can now be found in the care plan section) Acute Rehab PT Goals PT Goal Formulation: With  patient Time For Goal Achievement: 08/03/23 Potential to Achieve Goals: Fair Progress towards PT goals: Progressing toward goals    Frequency    Min 2X/week      PT Plan      Co-evaluation              AM-PAC PT "6 Clicks" Mobility   Outcome Measure  Help needed turning from your back to your side while in a flat bed without using bedrails?: A Little Help needed moving from lying on your back to sitting on the side of a flat bed without using bedrails?: A Little Help needed moving to and from a bed to a chair (including a wheelchair)?: A Little Help needed standing up from a chair  using your arms (e.g., wheelchair or bedside chair)?: A Little Help needed to walk in hospital room?: A Little Help needed climbing 3-5 steps with a railing? : Total 6 Click Score: 16    End of Session   Activity Tolerance: Patient tolerated treatment well Patient left: in bed;with call bell/phone within reach;with bed alarm set (sitting EOB eating supper) Nurse Communication: Mobility status PT Visit Diagnosis: Other abnormalities of gait and mobility (R26.89);Muscle weakness (generalized) (M62.81);History of falling (Z91.81)     Time: 1610-9604 PT Time Calculation (min) (ACUTE ONLY): 20 min  Charges:    $Therapeutic Activity: 8-22 mins PT General Charges $$ ACUTE PT VISIT: 1 Visit                     07/22/2023  Nohemi Batters., PT Acute Rehabilitation Services [phone]  (office)   Durell Gilding Gunter Conde 07/22/2023, 3:41 PM

## 2023-07-23 DIAGNOSIS — E8729 Other acidosis: Secondary | ICD-10-CM | POA: Diagnosis not present

## 2023-07-23 LAB — BASIC METABOLIC PANEL WITH GFR
Anion gap: 4 — ABNORMAL LOW (ref 5–15)
BUN: <5 mg/dL — ABNORMAL LOW (ref 6–20)
CO2: 23 mmol/L (ref 22–32)
Calcium: 6.8 mg/dL — ABNORMAL LOW (ref 8.9–10.3)
Chloride: 109 mmol/L (ref 98–111)
Creatinine, Ser: 0.8 mg/dL (ref 0.44–1.00)
GFR, Estimated: >60 mL/min (ref >60)
Glucose, Bld: 91 mg/dL (ref 70–99)
Potassium: 4.1 mmol/L (ref 3.5–5.1)
Sodium: 136 mmol/L (ref 135–145)

## 2023-07-23 LAB — MAGNESIUM: Magnesium: 1.4 mg/dL — ABNORMAL LOW (ref 1.7–2.4)

## 2023-07-23 MED ORDER — MAGNESIUM SULFATE 4 GM/100ML IV SOLN
4.0000 g | Freq: Once | INTRAVENOUS | Status: AC
  Administered 2023-07-23: 4 g via INTRAVENOUS
  Filled 2023-07-23: qty 100

## 2023-07-23 NOTE — Progress Notes (Signed)
 HD#2 Subjective:   Summary: Christina Byrd is a 58 y.o. 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  Overnight Events:   Doing well, was able to walk yesterday. Normal BM's. No NV. No pain. Pending SNF placement. Eat and drinking well   Objective:  Vital signs in last 24 hours: Vitals:   07/23/23 0012 07/23/23 0200 07/23/23 0519 07/23/23 0803  BP: 121/79 133/82 110/82 (!) 146/89  Pulse: (!) 118 (!) 104 (!) 102 95  Resp: 16 18 20 18   Temp: 98 F (36.7 C) 98.9 F (37.2 C) 98.7 F (37.1 C) 98.3 F (36.8 C)  TempSrc: Oral Oral Oral Oral  SpO2: 100% 100% 99% 100%  Weight:      Height:       Supplemental O2: Room Air SpO2: 100 %   Physical Exam:  Constitutional: No acute distress Cardiovascular: Tachycardic rate and rhythm, no m/r/g Pulmonary/Chest: normal work of breathing on room air, lungs clear to auscultation bilaterally Abd: Soft - Non-tender, Non-distended Skin: warm and dry.  Lower extremities without edema. Psych: Appropriate affect.  Filed Weights   07/19/23 2051 07/20/23 1600  Weight: 47.2 kg 50.4 kg      Intake/Output Summary (Last 24 hours) at 07/23/2023 0842 Last data filed at 07/23/2023 0805 Gross per 24 hour  Intake 340 ml  Output 600 ml  Net -260 ml   Net IO Since Admission: 813.33 mL [07/23/23 0842]  Pertinent Labs:    Latest Ref Rng & Units 07/22/2023    5:00 AM 07/21/2023    4:55 AM 07/20/2023    2:56 PM  CBC  WBC 4.0 - 10.5 K/uL 7.5  10.5    Hemoglobin 12.0 - 15.0 g/dL 7.2  7.9  9.5   Hematocrit 36.0 - 46.0 % 22.0  23.8  28.0   Platelets 150 - 400 K/uL 160  218         Latest Ref Rng & Units 07/23/2023    5:02 AM 07/22/2023    5:00 AM 07/21/2023    4:55 AM  CMP  Glucose 70 - 99 mg/dL 91  89  78   BUN 6 - 20 mg/dL 5  5  5    Creatinine 0.44 - 1.00 mg/dL 1.61  0.96  0.45   Sodium 135 - 145 mmol/L 136  138  140    Potassium 3.5 - 5.1 mmol/L 4.1  3.0  3.5   Chloride 98 - 111 mmol/L 109  108  110   CO2 22 - 32 mmol/L 23  22  24    Calcium  8.9 - 10.3 mg/dL 6.8  6.9  7.1   Total Protein 6.5 - 8.1 g/dL  4.8  5.1   Total Bilirubin 0.0 - 1.2 mg/dL  0.3  0.9   Alkaline Phos 38 - 126 U/L  114  132   AST 15 - 41 U/L  26  34   ALT 0 - 44 U/L  26  32     Assessment/Plan:   Principal Problem:   Alcoholic ketoacidosis  Alcohol use disorder AGMA - Resolved  Lactic acidosis -resolved She is interested in alcohol cessation, was prescribed naltrexone  outpatient.  Discussed the risks of continued alcohol use with the patient including worsening neuropathy.  She is motivated to quit. -Daily BMP -PT/OT/SLP   Encephalopathy-resolved Now at baseline.  Likely multifactorial in setting of metabolic derangement, vascular disease as evidenced by  MRI 07/05/2023, intoxication, medication use with additional concerns for underlying Korsakoff's disease.  CT head without acute structural abnormality.  UDS negative. - Follow up blood cultures   Hypokalemia Hypomagnesemia  Likely 2-2 poor po intake and alcohol use leading up to admission - Daily BMP, Mg - Replete as needed   Left lower extremity cellulitis Greatly improved following antibiotics, will transition to doxycycline  twice daily to complete a 5-day course. - Blood cultures pending, no growth to date   AKI - Resolved Cr 1.03, up from baseline of 0.7 on admit, improved with volume resuscitation.  Endorses poor p.o. intake leading up to this admission. Now back to baseline - Trend renal function - Avoid nephrotoxins   Hx of Wernicke's encephalopathy Patient was treated with high dose thiamine  during her previous admission. Upon hospital follow up, the patient's husband did endorse that the patient confabulates, raising concern that she may have Korsakoff syndrome. - Continue thiamine  and folic acid  - Alcohol cessation discussed, patient motivated to quit. -  On naltrexone  outpatient, will continue at time of discharge   Sinus tachycardia Patient has chronic tachycardia with heart rate ranging in the 110s-120s during outpatient appointments. - Consider beta blocker if BP will tolerate   Hx of dysphagia Esophagram in April noted that she has poor esophageal contracting/dysmotility.  Refusing clear liquid diet at this time, will advance to full diet.  Instructed patient to chew thoroughly, drink fluids with meals, sit upright. - Doing well with diet   Normocytic anemia Hb stable, slightly downtrending today likely secondary to dilution in setting of large volume resuscitation - Daily CBC   Seizure disorder On keppra  750 mg BID - continue home med.   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  Diet: Normal IVF: None,None VTE: DOAC Code: Full PT/OT recs: SNF for Subacute PT.  Dispo: Anticipated discharge to Skilled nursing facility pending insurance authorization, medically ready for discharge today.  [name] MD Internal Medicine Resident PGY-1 Pager: [phone] Please contact the on call pager after 5 pm and on weekends at [phone].

## 2023-07-23 NOTE — Progress Notes (Signed)
 Psychosocial Progressive/Outcome: ANOx4, calm and cooperative   Pain/Comfort Progression/Outcome: Pt did not complain of pain   Clinical Progression/Outcome: Independent in positioning  Independent in transferring to [facility]  Voids to [facility]  Had BM Maintained safety

## 2023-07-23 NOTE — Plan of Care (Signed)

## 2023-07-23 NOTE — TOC Progression Note (Signed)
 Transition of Care [facility]) - Progression Note    Patient Details  Name: Christina Byrd MRN: [id number] Date of Birth: [birth year]  Transition of Care [facility]) CM/SW Contact  Arron Big, [place] Phone Number: 07/23/2023, 2:18 PM  Clinical Narrative:   [facility] will have bed availability mid week this upcoming week. CSW will need to f/u regarding bed availability.   TOC will continue to follow.    Expected Discharge Plan: Skilled Nursing Facility Barriers to Discharge: Continued Medical Work up  Expected Discharge Plan and Services In-house Referral: Clinical Social Work     Living arrangements for the past 2 months: Single Family Home                                       Social Determinants of Health (SDOH) Interventions SDOH Screenings   Food Insecurity: No Food Insecurity (07/20/2023)  Housing: Low Risk  (07/20/2023)  Transportation Needs: No Transportation Needs (07/20/2023)  Utilities: Not At Risk (07/20/2023)  Depression (PHQ2-9): Low Risk  (06/30/2023)  Tobacco Use: Medium Risk (07/19/2023)    Readmission Risk Interventions    06/22/2023    2:16 PM 11/21/2022    1:01 PM  Readmission Risk Prevention Plan  Transportation Screening Complete Complete  Medication Review (RN Care Manager) Complete Complete  PCP or Specialist appointment within 3-5 days of discharge Complete Complete  HRI or Home Care Consult Complete Complete  SW Recovery Care/Counseling Consult Complete   Palliative Care Screening Not Applicable Not Applicable  Skilled Nursing Facility Patient Refused Not Applicable

## 2023-07-24 ENCOUNTER — Other Ambulatory Visit: Payer: Self-pay

## 2023-07-24 DIAGNOSIS — E8729 Other acidosis: Secondary | ICD-10-CM | POA: Diagnosis not present

## 2023-07-24 LAB — COMPREHENSIVE METABOLIC PANEL WITH GFR
ALT: 19 U/L (ref 0–44)
AST: 24 U/L (ref 15–41)
Albumin: 1.9 g/dL — ABNORMAL LOW (ref 3.5–5.0)
Alkaline Phosphatase: 149 U/L — ABNORMAL HIGH (ref 38–126)
Anion gap: 8 (ref 5–15)
BUN: 5 mg/dL — ABNORMAL LOW (ref 6–20)
CO2: 23 mmol/L (ref 22–32)
Calcium: 7.5 mg/dL — ABNORMAL LOW (ref 8.9–10.3)
Chloride: 104 mmol/L (ref 98–111)
Creatinine, Ser: 0.79 mg/dL (ref 0.44–1.00)
GFR, Estimated: >60 mL/min (ref >60)
Glucose, Bld: 118 mg/dL — ABNORMAL HIGH (ref 70–99)
Potassium: 3.9 mmol/L (ref 3.5–5.1)
Sodium: 135 mmol/L (ref 135–145)
Total Bilirubin: 0.2 mg/dL (ref 0.0–1.2)
Total Protein: 4.7 g/dL — ABNORMAL LOW (ref 6.5–8.1)

## 2023-07-24 LAB — CBC
HCT: 20.3 % — ABNORMAL LOW (ref 36.0–46.0)
HCT: 26.1 % — ABNORMAL LOW (ref 36.0–46.0)
Hemoglobin: 6.7 g/dL — CL (ref 12.0–15.0)
Hemoglobin: 8.7 g/dL — ABNORMAL LOW (ref 12.0–15.0)
MCH: 32.5 pg (ref 26.0–34.0)
MCH: 31.3 pg (ref 26.0–34.0)
MCHC: 33 g/dL (ref 30.0–36.0)
MCHC: 33.3 g/dL (ref 30.0–36.0)
MCV: 98.5 fL (ref 80.0–100.0)
MCV: 93.9 fL (ref 80.0–100.0)
Platelets: 153 10*3/uL (ref 150–400)
Platelets: 158 10*3/uL (ref 150–400)
RBC: 2.06 MIL/uL — ABNORMAL LOW (ref 3.87–5.11)
RBC: 2.78 MIL/uL — ABNORMAL LOW (ref 3.87–5.11)
RDW: 15.9 % — ABNORMAL HIGH (ref 11.5–15.5)
RDW: 16.8 % — ABNORMAL HIGH (ref 11.5–15.5)
WBC: 7.3 10*3/uL (ref 4.0–10.5)
WBC: 7.2 10*3/uL (ref 4.0–10.5)
nRBC: 0.3 % — ABNORMAL HIGH (ref 0.0–0.2)
nRBC: 0.3 % — ABNORMAL HIGH (ref 0.0–0.2)

## 2023-07-24 LAB — PREPARE RBC (CROSSMATCH)

## 2023-07-24 LAB — MAGNESIUM: Magnesium: 2.2 mg/dL (ref 1.7–2.4)

## 2023-07-24 MED ORDER — LIDOCAINE 5 % EX PTCH
1.0000 | MEDICATED_PATCH | CUTANEOUS | Status: DC
  Administered 2023-07-24: 1 via TRANSDERMAL
  Filled 2023-07-24: qty 1

## 2023-07-24 NOTE — Hospital Course (Addendum)
 Christina Byrd is a 58 y.o. 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 On admission, she was found to have an elevated lactic which required 6 L fluids resuscitation to improve.  On admission she had somnolence and altered mental status which quickly resolved following fluid resuscitation and normalization of lactic.  Her hospital stay was complicated by anemia which required a blood transfusion, as well as prolonged stay for SNF placement.  Alcohol use disorder AGMA - Resolved  Lactic acidosis -resolved She is interested in alcohol cessation, was prescribed naltrexone  outpatient.  Discussed the risks of continued alcohol use with the patient including worsening neuropathy.  She is motivated to quit.   Encephalopathy-resolved Now at baseline.  Likely multifactorial in setting of metabolic derangement, vascular disease as evidenced by MRI 07/05/2023, intoxication, medication use with additional concerns for underlying Korsakoff's disease.  CT head without acute structural abnormality.  UDS negative.  Hypokalemia Hypomagnesemia  Likely 2-2 poor po intake and alcohol use leading up to admission - Daily BMP, Mg - Replete as needed    Left lower extremity cellulitis Greatly improved following antibiotics, will transition to doxycycline  twice daily to complete a 5-day course. - Blood cultures pending, no growth to date   AKI - Resolved Cr 1.03, up from baseline of 0.7 on admit, improved with volume resuscitation.  Endorses poor p.o. intake leading up to this admission. Now back to baseline - Avoid nephrotoxins   Altered Mental Status - Resolved Patient was treated with high dose thiamine  during her previous admission. Upon hospital follow up, the patient's husband did endorse that the patient confabulates, raising concern that she may have Korsakoff syndrome. - Continue thiamine  and  folic acid  - Alcohol cessation discussed, patient motivated to quit. - On naltrexone  outpatient, will continue at time of discharge   Sinus tachycardia Patient has chronic tachycardia with heart rate ranging in the 110s-120s during outpatient appointments. - Consider beta blocker if BP will tolerate    Hx of dysphagia Esophagram in April noted that she has poor esophageal contracting/dysmotility.  Refusing clear liquid diet at this time, will advance to full diet.  Instructed patient to chew thoroughly, drink fluids with meals, sit upright. - Doing well with diet   Normocytic anemia Hemoglobin drifting down since admission.  Recent B12, folate, iron studies w/o signs of deficiency.  Recent EGD and colonoscopy without signs of bleeding.  Unclear etiology.  Reticulocyte obtained showing***. - 1uPRBC 07/24/2023   Hypothyroidism TSH decreased on admission, difficult to interpret in setting of acute illness.  TSH repeated showing***.  Seizure disorder On keppra  750 mg BID - continue home med.   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   07/25/2023 Reports difficulty falling asleep Melatonin PRN  Cut down labs

## 2023-07-24 NOTE — Progress Notes (Signed)
 Psychosocial Progressive/Outcome: ANOx4, calm and cooperative    Pain/Comfort Progression/Outcome: Pt did not complain of pain    Clinical Progression/Outcome: Independent in positioning  Independent in transferring to [facility]  Voids to [facility]  Had BM Slept in between care Maintained safety

## 2023-07-24 NOTE — Progress Notes (Signed)
 Mobility Specialist Progress Note:    07/24/23 0918  Therapy Vitals  Temp 98 F (36.7 C)  Temp Source Oral  Pulse Rate 97  BP (!) 157/111  Patient Position (if appropriate) Lying  Oxygen Therapy  SpO2 100 %  O2 Device Room Air  Mobility  Activity Ambulated with assistance in hallway  Level of Assistance Contact guard assist, steadying assist  Assistive Device Other (Comment) (IV Pole)  Distance Ambulated (ft) 200 ft  Activity Response Tolerated well  Mobility Referral Yes  Mobility visit 1 Mobility  Mobility Specialist Start Time (ACUTE ONLY) 0848  Mobility Specialist Stop Time (ACUTE ONLY) 0856  Mobility Specialist Time Calculation (min) (ACUTE ONLY) 8 min   Pt received in bed and agreeable. Had some swaying duration ambulation, managed w/ contact guard. No complaints throughout. Pt left in bed with call bell and all needs met.  D'Vante Nolon Baxter Mobility Specialist Please contact via Special educational needs teacher or Rehab office at [phone]

## 2023-07-24 NOTE — Progress Notes (Addendum)
 HD#3 Subjective:   Summary: Christina Byrd is a 58 y.o. 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  Overnight Events:   Feeling well this a.m., nothing new to report.  No melena, no hematochezia.  Stools are brown and normal, states that they were previously loose when she came in which has resolved.  She feels that her anemia is likely secondary to serial lab draws.  Objective:  Vital signs in last 24 hours: Vitals:   07/23/23 2013 07/24/23 0504 07/24/23 0548 07/24/23 0613  BP: (!) 151/101 (!) 134/94 (!) 156/99 (!) 151/100  Pulse: 94 96 98 93  Resp: 16 16 16 18   Temp: 98.9 F (37.2 C) 98.8 F (37.1 C) 98 F (36.7 C) 98.8 F (37.1 C)  TempSrc: Oral Oral Oral Oral  SpO2:  100% 98% 98%  Weight:      Height:       Supplemental O2: Room Air SpO2: 98 %   Physical Exam:  Constitutional: No acute distress Cardiovascular: Tachycardic rate and rhythm, no m/r/g Pulmonary/Chest: normal work of breathing on room air, lungs clear to auscultation bilaterally Abd: Soft - Non-tender, Non-distended Skin: warm and dry.  Lower extremities without edema. No residual erythema of lateral R leg noted at admission Psych: Appropriate affect.  Filed Weights   07/19/23 2051 07/20/23 1600  Weight: 47.2 kg 50.4 kg      Intake/Output Summary (Last 24 hours) at 07/24/2023 0720 Last data filed at 07/23/2023 1607 Gross per 24 hour  Intake 700 ml  Output 600 ml  Net 100 ml   Net IO Since Admission: 673.33 mL [07/24/23 0720]  Pertinent Labs:    Latest Ref Rng & Units 07/24/2023    1:56 AM 07/22/2023    5:00 AM 07/21/2023    4:55 AM  CBC  WBC 4.0 - 10.5 K/uL 7.3  7.5  10.5   Hemoglobin 12.0 - 15.0 g/dL 6.7  7.2  7.9   Hematocrit 36.0 - 46.0 % 20.3  22.0  23.8   Platelets 150 - 400 K/uL 153  160  218        Latest Ref Rng & Units 07/24/2023    1:56 AM 07/23/2023    5:02 AM  07/22/2023    5:00 AM  CMP  Glucose 70 - 99 mg/dL 295  91  89   BUN 6 - 20 mg/dL 5  <5  5   Creatinine 2.84 - 1.00 mg/dL 1.32  4.40  1.02   Sodium 135 - 145 mmol/L 135  136  138   Potassium 3.5 - 5.1 mmol/L 3.9  4.1  3.0   Chloride 98 - 111 mmol/L 104  109  108   CO2 22 - 32 mmol/L 23  23  22    Calcium  8.9 - 10.3 mg/dL 7.5  6.8  6.9   Total Protein 6.5 - 8.1 g/dL 4.7   4.8   Total Bilirubin 0.0 - 1.2 mg/dL 0.2   0.3   Alkaline Phos 38 - 126 U/L 149   114   AST 15 - 41 U/L 24   26   ALT 0 - 44 U/L 19   26     Assessment/Plan:   Principal Problem:   Alcoholic ketoacidosis  Alcohol use disorder AGMA - Resolved  Lactic acidosis -resolved She is interested in alcohol cessation, was prescribed naltrexone  outpatient.  Discussed the risks of continued  alcohol use with the patient including worsening neuropathy.  She is motivated to quit. -Daily BMP -PT/OT/SLP   Encephalopathy-resolved Now at baseline.  Likely multifactorial in setting of metabolic derangement, vascular disease as evidenced by MRI 07/05/2023, intoxication, medication use with additional concerns for underlying Korsakoff's disease.  CT head without acute structural abnormality.  UDS negative. - Follow up blood cultures   Hypokalemia Hypomagnesemia  Likely 2-2 poor po intake and alcohol use leading up to admission - Daily BMP, Mg - Replete as needed   Left lower extremity cellulitis Greatly improved following antibiotics, will transition to doxycycline  twice daily to complete a 5-day course. - Blood cultures pending, no growth to date   AKI - Resolved Cr 1.03, up from baseline of 0.7 on admit, improved with volume resuscitation.  Endorses poor p.o. intake leading up to this admission. Now back to baseline - Trend renal function - Avoid nephrotoxins   Altered Mental Status - Resolved Patient was treated with high dose thiamine  during her previous admission. Upon hospital follow up, the patient's husband did  endorse that the patient confabulates, raising concern that she may have Korsakoff syndrome. - Continue thiamine  and folic acid  - Alcohol cessation discussed, patient motivated to quit. - On naltrexone  outpatient, will continue at time of discharge   Sinus tachycardia, improved Patient has chronic tachycardia with heart rate ranging in the 110s-120s during outpatient appointments. - Consider beta blocker if BP will tolerate   Hx of dysphagia Esophagram in April noted that she has poor esophageal contracting/dysmotility.  Refusing clear liquid diet at this time, will advance to full diet.  Instructed patient to chew thoroughly, drink fluids with meals, sit upright. - Doing well with diet   Normocytic anemia Hemoglobin drifting down since admission. 6.7 this AM, last B12, folate, iron studies w/o signs of deficiency. Patient denies evidence of GI bleeding including melena, bright red blood per rectum, and hematemesis.  In April, she required 1 unit blood transfusion and GI evaluated patient for a bleed with endoscopy and colonoscopy; neither showed evidence of an acute bleed. No H.pylori, gastris, or colitis on surgical pathology. Anemia likely partly in setting of serial labs and rehydration. May have small bowel bleeding which is not seen on prior imaging. - Daily CBC - 1u PRBC 07/24/2023 - Reticulocyte count   Hypothyroidism TSH decreased on admission, difficult to interpret in setting of acute illness.  Now that she is doing better will obtain repeat TSH tomorrow a.m.  Seizure disorder On keppra  750 mg BID - continue home med.   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  Diet: Normal IVF: None,None VTE: DOAC Code: Full PT/OT recs: SNF for Subacute PT.  Dispo: Anticipated discharge to Skilled nursing facility pending insurance authorization, medically ready for discharge.  [name] MD Internal Medicine Resident PGY-1 Pager:  [phone] Please contact the on call pager after 5 pm and on weekends at [phone].

## 2023-07-25 DIAGNOSIS — R7989 Other specified abnormal findings of blood chemistry: Secondary | ICD-10-CM

## 2023-07-25 DIAGNOSIS — G4700 Insomnia, unspecified: Secondary | ICD-10-CM

## 2023-07-25 DIAGNOSIS — E8729 Other acidosis: Secondary | ICD-10-CM | POA: Diagnosis not present

## 2023-07-25 DIAGNOSIS — D649 Anemia, unspecified: Secondary | ICD-10-CM

## 2023-07-25 DIAGNOSIS — F1010 Alcohol abuse, uncomplicated: Secondary | ICD-10-CM

## 2023-07-25 DIAGNOSIS — F1090 Alcohol use, unspecified, uncomplicated: Secondary | ICD-10-CM

## 2023-07-25 DIAGNOSIS — E872 Acidosis, unspecified: Principal | ICD-10-CM

## 2023-07-25 LAB — CULTURE, BLOOD (ROUTINE X 2)
Culture: NO GROWTH
Culture: NO GROWTH

## 2023-07-25 LAB — CBC
HCT: 25.2 % — ABNORMAL LOW (ref 36.0–46.0)
Hemoglobin: 8.4 g/dL — ABNORMAL LOW (ref 12.0–15.0)
MCH: 31.7 pg (ref 26.0–34.0)
MCHC: 33.3 g/dL (ref 30.0–36.0)
MCV: 95.1 fL (ref 80.0–100.0)
Platelets: 150 10*3/uL (ref 150–400)
RBC: 2.65 MIL/uL — ABNORMAL LOW (ref 3.87–5.11)
RDW: 17.2 % — ABNORMAL HIGH (ref 11.5–15.5)
WBC: 6.8 10*3/uL (ref 4.0–10.5)
nRBC: 0 % (ref 0.0–0.2)

## 2023-07-25 LAB — BASIC METABOLIC PANEL WITH GFR
Anion gap: 8 (ref 5–15)
BUN: 8 mg/dL (ref 6–20)
CO2: 24 mmol/L (ref 22–32)
Calcium: 7.9 mg/dL — ABNORMAL LOW (ref 8.9–10.3)
Chloride: 103 mmol/L (ref 98–111)
Creatinine, Ser: 0.8 mg/dL (ref 0.44–1.00)
GFR, Estimated: >60 mL/min (ref >60)
Glucose, Bld: 118 mg/dL — ABNORMAL HIGH (ref 70–99)
Potassium: 3.9 mmol/L (ref 3.5–5.1)
Sodium: 135 mmol/L (ref 135–145)

## 2023-07-25 LAB — BPAM RBC
Blood Product Expiration Date: 202506232359
ISSUE DATE / TIME: 202506010552
Unit Type and Rh: 1700

## 2023-07-25 LAB — TYPE AND SCREEN
ABO/RH(D): B NEG
Antibody Screen: NEGATIVE
Unit division: 0

## 2023-07-25 LAB — MAGNESIUM: Magnesium: 1.4 mg/dL — ABNORMAL LOW (ref 1.7–2.4)

## 2023-07-25 LAB — RETICULOCYTES
Immature Retic Fract: 34.3 % — ABNORMAL HIGH (ref 2.3–15.9)
RBC.: 2.67 MIL/uL — ABNORMAL LOW (ref 3.87–5.11)
Retic Count, Absolute: 69.2 10*3/uL (ref 19.0–186.0)
Retic Ct Pct: 2.6 % (ref 0.4–3.1)

## 2023-07-25 LAB — TSH: TSH: 23.616 u[IU]/mL — ABNORMAL HIGH (ref 0.350–4.500)

## 2023-07-25 MED ORDER — MAGNESIUM OXIDE -MG SUPPLEMENT 400 (240 MG) MG PO TABS
400.0000 mg | ORAL_TABLET | Freq: Every day | ORAL | Status: DC
  Administered 2023-07-25 – 2023-07-27 (×3): 400 mg via ORAL
  Filled 2023-07-25 (×3): qty 1

## 2023-07-25 MED ORDER — MAGNESIUM SULFATE 2 GM/50ML IV SOLN
2.0000 g | Freq: Once | INTRAVENOUS | Status: AC
  Administered 2023-07-25: 2 g via INTRAVENOUS
  Filled 2023-07-25: qty 50

## 2023-07-25 MED ORDER — VITAMIN D 25 MCG (1000 UNIT) PO TABS
1000.0000 [IU] | ORAL_TABLET | Freq: Every day | ORAL | Status: DC
  Administered 2023-07-26 – 2023-07-27 (×2): 1000 [IU] via ORAL
  Filled 2023-07-25 (×2): qty 1

## 2023-07-25 MED ORDER — MELATONIN 3 MG PO TABS: 3.0000 mg | ORAL_TABLET | Freq: Every evening | ORAL | Status: DC | PRN

## 2023-07-25 NOTE — Progress Notes (Signed)
 PT Cancellation Note  Patient Details Name: MACON LESESNE MRN: [id number] DOB: 1965/03/07   Cancelled Treatment:    Reason Eval/Treat Not Completed: Other (comment) (Pt refused adamantly. States she will get up tomorrow.)   Florencia Hunter 07/25/2023, 10:57 AM Amal Saiki M,PT Acute Rehab Services [phone]

## 2023-07-25 NOTE — Progress Notes (Addendum)
 Subjective: Christina Byrd is a 58 y.o. 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   Overnight Events: N/A  Today, patient reports feeling alright but did have some difficulty sleeping last night. She is also requesting less/ no labs if possible. She denies any other complaints.   Objective:  Vital signs in last 24 hours: Vitals:   07/24/23 0918 07/24/23 1639 07/24/23 2043 07/25/23 0731  BP: (!) 157/111 (!) 143/97 (!) 140/96 (!) 142/107  Pulse: 97 (!) 105 (!) 106 (!) 103  Resp: 18 18    Temp: 98 F (36.7 C) 98 F (36.7 C) 98.2 F (36.8 C) 98.7 F (37.1 C)  TempSrc: Oral Oral Oral Oral  SpO2: 100% 99% 100% 98%  Weight:      Height:       Physical Exam: General:Laying in bed, NAD Cardiac:RRR, no murmurs appreciated  Pulmonary:Clear to auscultate bilaterally, normal effort on RA Abdominal:soft, non-tender Neuro:awake, alert, participating in conversation  Skin:warm and dry Psych:  normal mood and affect      Latest Ref Rng & Units 07/25/2023    3:11 AM 07/24/2023    1:13 PM 07/24/2023    1:56 AM  CBC  WBC 4.0 - 10.5 K/uL 6.8  7.2  7.3   Hemoglobin 12.0 - 15.0 g/dL 8.4  8.7  6.7   Hematocrit 36.0 - 46.0 % 25.2  26.1  20.3   Platelets 150 - 400 K/uL 150  158  153        Latest Ref Rng & Units 07/25/2023    3:11 AM 07/24/2023    1:56 AM 07/23/2023    5:02 AM  BMP  Glucose 70 - 99 mg/dL 875  643  91   BUN 6 - 20 mg/dL 8  5  <5   Creatinine 3.29 - 1.00 mg/dL 5.18  8.41  6.60   Sodium 135 - 145 mmol/L 135  135  136   Potassium 3.5 - 5.1 mmol/L 3.9  3.9  4.1   Chloride 98 - 111 mmol/L 103  104  109   CO2 22 - 32 mmol/L 24  23  23    Calcium  8.9 - 10.3 mg/dL 7.9  7.5  6.8      Assessment/Plan:  Principal Problem:   Alcoholic ketoacidosis  AUD Alcoholic Ketoacidosis  AGMA: Resolved Lactic acidosis: Resolved  Patient is stable and medically ready for  discharge to SNF facility for rehab. She is agreeable to SNF this admission.  Plan: -Stable for SNF placement -Naltrexone  at D/c if patient remains agreeable to alcohol cessation   Normocytic Anemia Patient required one unit of pRBCs on 06/01 for a hemoglobin of 6.7. Hemoglobin has remained stable today at 8.4. The normocytic anemia is most likely due to poor nutrition status and alcohol use disorder. She has evidence of inadequate production with a corrected reticulocyte count of 0.7 likely from the poor nutrition/ alcohol use disorder. Other etiologies previously evaluated including iron deficiency, B12 and B9 deficiency which were non-indicative for the cause of normocytic anemia.   Plan: -Hemoglobin has remained stable after receiving one unit, will continue to monitor clinically.  -No additional labs required inpatient.  -Outpatient follow up CBC -Alcohol cessation  -Lab holiday tomorrow   Elevated TSH Patient has elevated TSH at 23.6, prior TSH 5 days prior was 47.997. T4 2 weeks prior was WNL. Abnormal thyroid labs are most likely due to  acute illness.  Plan: -Outpatient TSH levels and T4 when she is not acutely ill   Insomnia -Melatonin 3mg  nightly prn   __________________________________  Code Status: Full  VTE Prophylaxis:Xarelto  Diet:Regular  IVF:N/A Barriers to Discharge: Placement  Dispo: Anticipated discharge in approximately 1-2 day(s), Medically stable for discharge   Aurora Lees, DO 07/25/2023, 11:28 AM After 5pm on weekdays and 1pm on weekends: On Call pager [phone]

## 2023-07-25 NOTE — TOC Progression Note (Signed)
 Transition of Care [facility]) - Progression Note    Patient Details  Name: Christina Byrd MRN: [id number] Date of Birth: [birth year]  Transition [facility]) CM/SW Contact  Ajanae Virag A [place], LCSW Phone Number: 07/25/2023, 11:41 AM  Clinical Narrative:     CSW met with pt a at bedside to provide bed offers with Medicare.gov ratings. She stated that she would accept the bed offer for placement. CSW reached out to [employer], [employer], requested to start insurance authorization as pt is medically stable.   TOC will continue to follow.    Expected Discharge Plan: Skilled Nursing Facility Barriers to Discharge: Continued Medical Work up  Expected Discharge Plan and Services In-house Referral: Clinical Social Work     Living arrangements for the past 2 months: Single Family Home                                       Social Determinants of Health (SDOH) Interventions SDOH Screenings   Food Insecurity: No Food Insecurity (07/20/2023)  Housing: Low Risk  (07/20/2023)  Transportation Needs: No Transportation Needs (07/20/2023)  Utilities: Not At Risk (07/20/2023)  Depression (PHQ2-9): Low Risk  (06/30/2023)  Tobacco Use: Medium Risk (07/19/2023)    Readmission Risk Interventions    06/22/2023    2:16 PM 11/21/2022    1:01 PM  Readmission Risk Prevention Plan  Transportation Screening Complete Complete  Medication Review (RN Care Manager) Complete Complete  PCP or Specialist appointment within 3-5 days of discharge Complete Complete  HRI or Home Care Consult Complete Complete  SW Recovery Care/Counseling Consult Complete   Palliative Care Screening Not Applicable Not Applicable  Skilled Nursing Facility Patient Refused Not Applicable

## 2023-07-25 NOTE — Progress Notes (Signed)
 Psychosocial Progressive/Outcome: ANOx4, calm and cooperative    Pain/Comfort Progression/Outcome: Pt did not complain of pain    Clinical Progression/Outcome: Independent in positioning  Independent in transferring to [facility]  Voids to [facility]  Had BM Slept in between care Maintained safety

## 2023-07-26 DIAGNOSIS — G4700 Insomnia, unspecified: Secondary | ICD-10-CM | POA: Diagnosis not present

## 2023-07-26 DIAGNOSIS — D649 Anemia, unspecified: Secondary | ICD-10-CM | POA: Diagnosis not present

## 2023-07-26 DIAGNOSIS — F1090 Alcohol use, unspecified, uncomplicated: Secondary | ICD-10-CM | POA: Diagnosis not present

## 2023-07-26 DIAGNOSIS — E8729 Other acidosis: Secondary | ICD-10-CM | POA: Diagnosis not present

## 2023-07-26 NOTE — Plan of Care (Signed)

## 2023-07-26 NOTE — Progress Notes (Signed)
 Physical Therapy Treatment Patient Details Name: Christina Byrd MRN: [id number]  DOB: 1965-08-11 Today's Date: 07/26/2023   History of Present Illness 58 y.o. female presents to [facility] hospital on 07/19/2023 with tremors, nausea and vomiting. PMH includes alcohol abuse, alcoholic pancreatitis, seizure disorder, Wernicke's encephalopathy.    PT Comments  Pt received in supine, agreeable to therapy session with encouragement. Emphasis on gait and stair negotiation and pt performing today without assistive device and up to CGA for safety. No buckling or LOB, and pt agreeable to sit up in chair post-exertion with recliner brought to her room. Pt continues to benefit from PT services to progress toward functional mobility goals.     If plan is discharge home, recommend the following: Assistance with cooking/housework;Assist for transportation;Help with stairs or ramp for entrance;A little help with walking and/or transfers;A little help with bathing/dressing/bathroom   Can travel by private vehicle     Yes  Equipment Recommendations  Rolling walker (2 wheels);BSC/3in1;Wheelchair (measurements PT);Wheelchair cushion (measurements PT) (WC pending progress)    Recommendations for Other Services       Precautions / Restrictions Precautions Precautions: Fall Recall of Precautions/Restrictions: Intact Restrictions Weight Bearing Restrictions Per Provider Order: No     Mobility  Bed Mobility Overal bed mobility: Modified Independent Bed Mobility: Supine to Sit           General bed mobility comments: no physical assist needed.    Transfers Overall transfer level: Needs assistance Equipment used: None Transfers: Sit to/from Stand Sit to Stand: Supervision           General transfer comment: from EOB and to chair, pt pushing up to stand.    Ambulation/Gait Ambulation/Gait assistance: Supervision Gait Distance (Feet): 200 Feet Assistive device: None Gait Pattern/deviations:  Decreased step length - right, Decreased step length - left, Decreased stride length, Decreased dorsiflexion - left, Decreased dorsiflexion - right, Narrow base of support Gait velocity: reduced     General Gait Details: Pt able to maintain straight path this date, small, low steps with decreased bil heel strike and hip flexion but no LOB or buckling.   Stairs Stairs: Yes Stairs assistance: Contact guard assist Stair Management: Two rails, Step to pattern, Backwards, Forwards Number of Stairs: 10 General stair comments: step up/down with RW to simulate bil rails and no buckling or LOB, CGA for safety and to stabilize RW   Wheelchair Mobility     Tilt Bed    Modified Rankin (Stroke Patients Only)       Balance Overall balance assessment: Needs assistance Sitting-balance support: No upper extremity supported, Feet supported, Feet unsupported Sitting balance-Leahy Scale: Good Sitting balance - Comments: sitting EOB   Standing balance support: No upper extremity supported, During functional activity Standing balance-Leahy Scale: Fair Standing balance comment: no LOB or buckling without AD, supervision for safety.                            Communication Communication Communication: No apparent difficulties  Cognition Arousal: Alert Behavior During Therapy: WFL for tasks assessed/performed   PT - Cognitive impairments: No apparent impairments                       PT - Cognition Comments: Pt appears aware of deficits and recalls previous sessions. Can be self-directed but with discussion on therapist reasoning for furniture placement as room adjusted, pt agreeable to participate. Following commands: Intact Following commands impaired: Follows  one step commands with increased time    Cueing Cueing Techniques: Verbal cues  Exercises      General Comments General comments (skin integrity, edema, etc.): no acute s/sx distress with postural changes, VSS  per chart review.      Pertinent Vitals/Pain Pain Assessment Pain Assessment: Faces Faces Pain Scale: Hurts little more Pain Location: generalized Pain Descriptors / Indicators: Discomfort, Grimacing Pain Intervention(s): Limited activity within patient's tolerance, Monitored during session, Repositioned, Patient requesting pain meds-RN notified    Home Living                          Prior Function            PT Goals (current goals can now be found in the care plan section) Acute Rehab PT Goals PT Goal Formulation: With patient Time For Goal Achievement: 08/03/23 Progress towards PT goals: Progressing toward goals    Frequency    Min 2X/week      PT Plan      Co-evaluation              AM-PAC PT "6 Clicks" Mobility   Outcome Measure  Help needed turning from your back to your side while in a flat bed without using bedrails?: None Help needed moving from lying on your back to sitting on the side of a flat bed without using bedrails?: None Help needed moving to and from a bed to a chair (including a wheelchair)?: A Little Help needed standing up from a chair using your arms (e.g., wheelchair or bedside chair)?: A Little Help needed to walk in hospital room?: A Little Help needed climbing 3-5 steps with a railing? : A Little 6 Click Score: 20    End of Session Equipment Utilized During Treatment: Gait belt Activity Tolerance: Patient tolerated treatment well Patient left: with call bell/phone within reach;in chair (recliner brought to her room by PTA and set up with pillows; no chair alarm was in room, RN notified.) Nurse Communication: Mobility status;Patient requests pain meds PT Visit Diagnosis: Other abnormalities of gait and mobility (R26.89);Muscle weakness (generalized) (M62.81);History of falling (Z91.81)     Time: 1440 (left 1448 to find chair, back at 1452)-1507 PT Time Calculation (min) (ACUTE ONLY): 27 min  Charges:    $Gait  Training: 23-37 mins PT General Charges $$ ACUTE PT VISIT: 1 Visit                     Viraat Vanpatten P., PTA Acute Rehabilitation Services Secure Chat Preferred 9a-5:30pm Office: [phone]    Mariel Shope [facility] 07/26/2023, 4:08 PM

## 2023-07-26 NOTE — Progress Notes (Signed)
 Psychosocial Progressive/Outcome: ANOx4, calm and cooperative    Pain/Comfort Progression/Outcome: Pain treated with PRN medication    Clinical Progression/Outcome: Adequate fluid and diet intake  Independent in positioning  Independent in transferring to [facility]  Voids to [facility]  Worked w/ PT  Slept in between care Maintained safety

## 2023-07-26 NOTE — Progress Notes (Signed)
 Subjective: Christina Byrd is a 58 y.o. 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   Overnight Events: N/A  Today, she denies any complaints. She slept well without the melatonin. She is not having abdominal pain now but did have some yesterday when she drank an ensure. She continues to report wanting to continue alcohol cessation and is still interested in Naltrexone .    Objective:  Vital signs in last 24 hours: Vitals:   07/25/23 2030 07/26/23 0025 07/26/23 0421 07/26/23 0858  BP: (!) 144/97 (!) 145/95 (!) 157/106 (!) 148/105  Pulse: (!) 108 (!) 107 98 93  Resp: 18 18 16 17   Temp: 98.8 F (37.1 C) 98.5 F (36.9 C) 98.1 F (36.7 C) 98.1 F (36.7 C)  TempSrc:   Oral Oral  SpO2: 100% 100% 100% 100%  Weight:      Height:       Physical Exam: General:NAD, resting in bed  Cardiac:RRR, no murmurs appreciated  Pulmonary: Normal effort on RA, clear to auscultate bilaterally  Abdominal: soft, non tender  Neuro:awake, alert, participating in conversation  Skin:warm and dry Psych: normal mood and affect      Latest Ref Rng & Units 07/25/2023    3:11 AM 07/24/2023    1:13 PM 07/24/2023    1:56 AM  CBC  WBC 4.0 - 10.5 K/uL 6.8  7.2  7.3   Hemoglobin 12.0 - 15.0 g/dL 8.4  8.7  6.7   Hematocrit 36.0 - 46.0 % 25.2  26.1  20.3   Platelets 150 - 400 K/uL 150  158  153        Latest Ref Rng & Units 07/25/2023    3:11 AM 07/24/2023    1:56 AM 07/23/2023    5:02 AM  BMP  Glucose 70 - 99 mg/dL 604  540  91   BUN 6 - 20 mg/dL 8  5  <5   Creatinine 9.81 - 1.00 mg/dL 1.91  4.78  2.95   Sodium 135 - 145 mmol/L 135  135  136   Potassium 3.5 - 5.1 mmol/L 3.9  3.9  4.1   Chloride 98 - 111 mmol/L 103  104  109   CO2 22 - 32 mmol/L 24  23  23    Calcium  8.9 - 10.3 mg/dL 7.9  7.5  6.8      Assessment/Plan:  Principal Problem:   Alcoholic ketoacidosis Active Problems:   Alcohol  abuse   Lactic acidosis  AUD Alcoholic Ketoacidosis  AGMA: Resolved Lactic acidosis: Resolved  Patient is stable and medically ready for discharge to SNF facility for rehab. She is agreeable to SNF this admission.  Plan: -Stable for SNF placement -Naltrexone  at discharge as patient prefers to continue alcohol cessation.   Normocytic Anemia Patient required one unit of pRBCs on 06/01 for a hemoglobin of 6.7 which improved to 8.4. The normocytic anemia is most likely due to poor nutrition status and alcohol use disorder. She has evidence of inadequate production with a corrected reticulocyte count of 0.7 likely from the poor nutrition/ alcohol use disorder.  Plan: -Will continue to monitor clinically.  -No additional labs required inpatient.  -Outpatient follow up CBC -Alcohol cessation  -Lab holiday   Elevated TSH Patient has elevated TSH at 23.6, prior TSH 5 days prior was 47.997. T4 2 weeks prior was WNL. Abnormal thyroid labs are most likely due to acute illness.  Plan: -Outpatient TSH levels and T4 when she is not acutely ill   Insomnia -Melatonin 3mg  nightly prn   __________________________________  Code Status: Full  VTE Prophylaxis:Xarelto  Diet:Regular  IVF:N/A Barriers to Discharge: Placement  Dispo: Anticipated discharge in approximately 1 day, Medically stable for discharge   Aurora Lees, DO 07/26/2023, 11:03 AM After 5pm on weekdays and 1pm on weekends: On Call pager [phone]

## 2023-07-26 NOTE — TOC Progression Note (Signed)
 Transition of Care [facility]) - Progression Note    Patient Details  Name: Christina Byrd MRN: [id number] Date of Birth: 1965-12-09  Transition of Care [facility] CM/SW Contact  Talal Fritchman A [place], LCSW Phone Number: 07/26/2023, 11:59 AM  Clinical Narrative:     Pt's authorization is still pending for [facility].    TOC will continue to follow.   Expected Discharge Plan: Skilled Nursing Facility Barriers to Discharge: Continued Medical Work up  Expected Discharge Plan and Services In-house Referral: Clinical Social Work     Living arrangements for the past 2 months: Single Family Home                                       Social Determinants of Health (SDOH) Interventions SDOH Screenings   Food Insecurity: No Food Insecurity (07/20/2023)  Housing: Low Risk  (07/20/2023)  Transportation Needs: No Transportation Needs (07/20/2023)  Utilities: Not At Risk (07/20/2023)  Depression (PHQ2-9): Low Risk  (06/30/2023)  Tobacco Use: Medium Risk (07/19/2023)    Readmission Risk Interventions    06/22/2023    2:16 PM 11/21/2022    1:01 PM  Readmission Risk Prevention Plan  Transportation Screening Complete Complete  Medication Review (RN Care Manager) Complete Complete  PCP or Specialist appointment within 3-5 days of discharge Complete Complete  HRI or Home Care Consult Complete Complete  SW Recovery Care/Counseling Consult Complete   Palliative Care Screening Not Applicable Not Applicable  Skilled Nursing Facility Patient Refused Not Applicable

## 2023-07-27 ENCOUNTER — Other Ambulatory Visit (HOSPITAL_COMMUNITY): Payer: Self-pay

## 2023-07-27 DIAGNOSIS — E8729 Other acidosis: Secondary | ICD-10-CM | POA: Diagnosis not present

## 2023-07-27 DIAGNOSIS — D649 Anemia, unspecified: Secondary | ICD-10-CM | POA: Diagnosis not present

## 2023-07-27 DIAGNOSIS — G4700 Insomnia, unspecified: Secondary | ICD-10-CM | POA: Diagnosis not present

## 2023-07-27 DIAGNOSIS — F1090 Alcohol use, unspecified, uncomplicated: Secondary | ICD-10-CM | POA: Diagnosis not present

## 2023-07-27 MED ORDER — THIAMINE HCL 100 MG PO TABS
100.0000 mg | ORAL_TABLET | Freq: Every day | ORAL | 0 refills | Status: DC
  Filled 2023-07-27: qty 30, 30d supply, fill #0

## 2023-07-27 MED ORDER — VITAMIN D3 25 MCG PO TABS
1000.0000 [IU] | ORAL_TABLET | Freq: Every day | ORAL | 0 refills | Status: DC
  Filled 2023-07-27: qty 30, 30d supply, fill #0

## 2023-07-27 MED ORDER — NALTREXONE HCL 50 MG PO TABS
50.0000 mg | ORAL_TABLET | Freq: Every day | ORAL | 0 refills | Status: DC
Start: 2023-07-27 — End: 2023-10-16
  Filled 2023-07-27: qty 30, 30d supply, fill #0

## 2023-07-27 MED ORDER — ACETAMINOPHEN 325 MG PO TABS
650.0000 mg | ORAL_TABLET | Freq: Four times a day (QID) | ORAL | 0 refills | Status: AC | PRN
  Filled 2023-07-27: qty 30, 4d supply, fill #0

## 2023-07-27 MED ORDER — MAGNESIUM OXIDE -MG SUPPLEMENT 400 (240 MG) MG PO TABS
400.0000 mg | ORAL_TABLET | Freq: Every day | ORAL | 0 refills | Status: DC
  Filled 2023-07-27: qty 30, 30d supply, fill #0

## 2023-07-27 MED ORDER — NALTREXONE HCL 50 MG PO TABS
50.0000 mg | ORAL_TABLET | Freq: Every day | ORAL | Status: DC
  Administered 2023-07-27: 50 mg via ORAL
  Filled 2023-07-27: qty 1

## 2023-07-27 NOTE — Discharge Summary (Signed)
 Name: Christina Byrd MRN: [id number] DOB: [birth year] 58 y.o. PCP: Carleen Chary, DO  Date of Admission: 07/19/2023  8:47 PM Date of Discharge: 07/27/2023 3:16 PM Attending Physician: Dr. Lelia Putnam   Discharge Diagnosis: Principal Problem:   Alcoholic ketoacidosis Active Problems:   Alcohol abuse   Lactic acidosis    Discharge Medications: Allergies as of 07/27/2023       Reactions   Losartan  Potassium    Rash   Levaquin  [levofloxacin  In D5w] Rash   Ace Inhibitors Cough   REACTION: cough   Codeine  Nausea Only   Cefepime  Rash   09/04/12 pm Patient started to break out with small macules after IV Vanco infusion, then macules increased in size after starting cefepime  infusion.   Chlorhexidine Itching, Rash   Vancomycin  Rash   09/04/12 pm Patient started to break out with small macules after IV Vanco infusion, then macules increased in size after starting cefepime  infusion.        Medication List     STOP taking these medications    ergocalciferol  1.25 MG (50000 UT) capsule Commonly known as: VITAMIN D2   fluticasone  50 MCG/ACT nasal spray Commonly known as: FLONASE        TAKE these medications    acetaminophen  325 MG tablet Commonly known as: TYLENOL  Take 2 tablets (650 mg total) by mouth every 6 (six) hours as needed for mild pain (pain score 1-3) or fever (or Fever >/= 101).   allopurinol  100 MG tablet Commonly known as: ZYLOPRIM  Take 2 tablets (200 mg total) by mouth daily.   atorvastatin  20 MG tablet Commonly known as: LIPITOR Take 1 tablet (20 mg total) by mouth daily.   cetirizine  10 MG tablet Commonly known as: ZyrTEC  Allergy Take 1 tablet (10 mg total) by mouth daily.   DULoxetine  30 MG capsule Commonly known as: Cymbalta  Take 1 capsule (30 mg total) by mouth daily.   etodolac  400 MG tablet Commonly known as: Lodine  Take 1 tablet (400 mg total) by mouth 2 (two) times daily.   feeding supplement Liqd Take 237 mLs by mouth 3 (three)  times daily between meals. What changed:  when to take this reasons to take this   folic acid  1 MG tablet Commonly known as: FOLVITE  Take 1 tablet (1 mg total) by mouth daily.   gabapentin  100 MG capsule Commonly known as: Neurontin  Take 1 capsule (100 mg total) by mouth 3 (three) times daily.   levETIRAcetam  750 MG tablet Commonly known as: KEPPRA  Take 1 tablet (750 mg total) by mouth 2 (two) times daily.   magnesium  oxide 400 (240 Mg) MG tablet Commonly known as: MAG-OX Take 1 tablet (400 mg total) by mouth daily.   multivitamin with minerals Tabs tablet Take 1 tablet by mouth daily.   naltrexone  50 MG tablet Commonly known as: DEPADE Take 1 tablet (50 mg total) by mouth daily.   ondansetron  4 MG tablet Commonly known as: Zofran  Take 1 tablet (4 mg total) by mouth every 8 (eight) hours as needed for nausea or vomiting.   pantoprazole  40 MG tablet Commonly known as: Protonix  Take 1 tablet (40 mg total) by mouth 2 (two) times daily.   thiamine  100 MG tablet Commonly known as: VITAMIN B1 Take 1 tablet (100 mg total) by mouth daily.   vitamin D3 25 MCG tablet Commonly known as: CHOLECALCIFEROL Take 1 tablet (1,000 Units total) by mouth daily. Start taking on: July 28, 2023  Durable Medical Equipment  (From admission, onward)           Start     Ordered   07/27/23 1147  DME 3-in-1  Once        07/27/23 1147            Disposition and follow-up:   Ms.Reggie L Verry was discharged from [facility] in Good condition.  At the hospital follow up visit please address:  1.  Follow-up:  *AUD *Hypomagnesemia  -Assess alcohol cessation -Note that she was started on Naltrexone  and thiamine  100 MG tablet: take daily  -Check magnesium  level: patient was started on mag-ox 400 mg daily   *Normocytic Anemia  -Obtain CBC -Assess for bleeding  *Abnormal TSH -Check TSH and T4 when patient is not acutely ill  *Vitamin D   deficiency -Check Vitamin D3 level, patient was started on 1000 units of Vitamin D3 daily    2.  Labs / imaging needed at time of follow-up: CBC, Magnesium , BMP, TSH, Vitamin D  level  3.  Pending labs/ test needing follow-up: N/A  4.  Medication Changes  STOPPED  -ergocalciferol  1.25 MG (50000 UT) capsule  -fluticasone  50 MCG/ACT nasal spray    ADDED  -Naltrexone  50 mg daily: This medication helps decrease your alcohol cravings  -Tylenol  take 650 mg every 6 hours as needed for pain -magnesium  oxide 400 (240 Mg) MG tablet, take daily for low magnesium   -thiamine  100 MG tablet: take daily  -vitamin D3 25 MCG tablet take one pill daily for low vitamin D    MODIFIED  -N/A    Follow-up Appointments:  Contact information for follow-up providers     [facility] Follow up.   Specialty: Rehabilitation Why: Please call the Outpatient rehabilitation center and follow up regarding Outpatient therapy services. Contact information: [address] [place]  [phone]             Contact information for after-discharge care     Destination     [facility] .   Service: Skilled Nursing Contact information: [address] [place]   [address] [phone]                     Hospital Course by problem list: Alcohol use disorder AGMA - Resolved  Lactic acidosis -resolved She is interested in alcohol cessation, was prescribed naltrexone  outpatient.  Discussed the risks of continued alcohol use with the patient including worsening neuropathy.  She is motivated to quit and was discharged home with naltrexone  50mg  daily.    Encephalopathy-resolved Now at baseline.  Likely multifactorial in setting of metabolic derangement, vascular disease as evidenced by MRI 07/05/2023, intoxication, medication use with additional concerns for underlying  Korsakoff's disease.  CT head without acute structural abnormality.  UDS negative.  Left lower extremity cellulitis Greatly improved following antibiotics, will transition to doxycycline  twice daily to complete 5 day course of doxycyline.   AKI - Resolved Cr 1.03, up from baseline of 0.7 on admit, improved with volume resuscitation.  Endorses poor p.o. intake leading up to this admission. Now back to baseline   Altered Mental Status - Resolved Patient was treated with high dose thiamine  during her previous admission. Upon hospital follow up, the patient's husband did endorse that the patient confabulates, raising concern that she may have Korsakoff syndrome.   Sinus tachycardia Patient has chronic tachycardia with heart rate ranging in the 110s-120s during  outpatient appointments.   Hx of dysphagia Esophagram in April noted that she has poor esophageal contracting/dysmotility.  Refusing clear liquid diet at this time, will advance to full diet.  Instructed patient to chew thoroughly, drink fluids with meals, sit upright.   Normocytic Anemia Patient required one unit of pRBCs on 06/01 for a hemoglobin of 6.7 which improved to 8.4. The normocytic anemia is most likely due to poor nutrition status and alcohol use disorder. She has evidence of inadequate production with a corrected reticulocyte count of 0.7 likely from the poor nutrition/ alcohol use disorder.    Hypothyroidism TSH decreased on admission, difficult to interpret in setting of acute illness.  Seizure disorder On keppra  750 mg BID - continue home med.   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    Discharge Subjective: Patient denies acute complaints today and reported feeling well. She reported that the tingling in the feet has since improved.   Discharge Exam:   Blood pressure (!) 136/94, pulse (!) 105, temperature 98.6 F (37 C), resp. rate 17, height 5\' 2"  (1.575 m), weight 50.4 kg, last  menstrual period 08/23/2011, SpO2 98%.  Constitutional:Resting comfortably in bed, NAD Cardiovascular: regular rate and rhythm Pulmonary/Chest: normal work of breathing on room air Abdominal: soft, non-tender, non-distended. Neurological: alert & awake  Skin: warm and dry Psych: Normal mood and affect  Pertinent Labs, Studies, and Procedures:     Latest Ref Rng & Units 07/25/2023    3:11 AM 07/24/2023    1:13 PM 07/24/2023    1:56 AM  CBC  WBC 4.0 - 10.5 K/uL 6.8  7.2  7.3   Hemoglobin 12.0 - 15.0 g/dL 8.4  8.7  6.7   Hematocrit 36.0 - 46.0 % 25.2  26.1  20.3   Platelets 150 - 400 K/uL 150  158  153        Latest Ref Rng & Units 07/25/2023    3:11 AM 07/24/2023    1:56 AM 07/23/2023    5:02 AM  CMP  Glucose 70 - 99 mg/dL 161  096  91   BUN 6 - 20 mg/dL 8  5  <5   Creatinine 0.45 - 1.00 mg/dL 4.09  8.11  9.14   Sodium 135 - 145 mmol/L 135  135  136   Potassium 3.5 - 5.1 mmol/L 3.9  3.9  4.1   Chloride 98 - 111 mmol/L 103  104  109   CO2 22 - 32 mmol/L 24  23  23    Calcium  8.9 - 10.3 mg/dL 7.9  7.5  6.8   Total Protein 6.5 - 8.1 g/dL  4.7    Total Bilirubin 0.0 - 1.2 mg/dL  0.2    Alkaline Phos 38 - 126 U/L  149    AST 15 - 41 U/L  24    ALT 0 - 44 U/L  19      CT HEAD WO CONTRAST ( ) Result Date: 07/20/2023 CLINICAL DATA:  Altered mental status. EXAM: CT HEAD WITHOUT CONTRAST TECHNIQUE: Contiguous axial images were obtained from the base of the skull through the vertex without intravenous contrast. RADIATION DOSE REDUCTION: This exam was performed according to the departmental dose-optimization program which includes automated exposure control, adjustment of the mA and/or kV according to patient size and/or use of iterative reconstruction technique. COMPARISON:  07/02/2023 FINDINGS: Brain: No evidence of intracranial hemorrhage, acute infarction, hydrocephalus, extra-axial collection, or mass lesion/mass effect. Old cerebral infarcts are  again seen in the left frontal and left  occipital lobes Vascular:  No hyperdense vessel or other acute findings. Skull: No evidence of fracture or other significant bone abnormality. Sinuses/Orbits:  No acute findings. Other: None. IMPRESSION: No acute intracranial abnormality. 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 Electronically Signed   By: Marlyce Sine M.D.   On: 07/20/2023 17:07   CT ABDOMEN PELVIS W CONTRAST Result Date: 07/20/2023 CLINICAL DATA:  Nausea and vomiting. EXAM: CT ABDOMEN AND PELVIS WITH CONTRAST TECHNIQUE: Multidetector CT imaging of the abdomen and pelvis was performed using the standard protocol following bolus administration of intravenous contrast. RADIATION DOSE REDUCTION: This exam was performed according to the departmental dose-optimization program which includes automated exposure control, adjustment of the mA and/or kV according to patient size and/or use of iterative reconstruction technique. CONTRAST:  75mL OMNIPAQUE  IOHEXOL  350 MG/ML SOLN COMPARISON:  07/04/2023 FINDINGS: Lower chest: No acute findings. Hepatobiliary: The liver shows diffusely decreased attenuation suggesting fat deposition. No suspicious focal abnormality within the liver parenchyma. Layering tiny calcified gallstones identified in the fundal region (47/3). No intrahepatic or extrahepatic biliary dilation. Pancreas: No focal mass lesion. No dilatation of the main duct. No intraparenchymal cyst. No peripancreatic edema. Spleen: No splenomegaly. No suspicious focal mass lesion. Adrenals/Urinary Tract: Right adrenal gland unremarkable. Thickening of the left adrenal gland is similar to prior. Kidneys unremarkable. No evidence for hydroureter. Bladder is nondistended. Stomach/Bowel: Stomach is unremarkable. No gastric wall thickening. No evidence of outlet obstruction. Duodenum is normally positioned as is the ligament of Treitz. No small bowel wall thickening. No small bowel dilatation. The terminal ileum is normal. The appendix is normal. No gross  colonic mass. No colonic wall thickening. Vascular/Lymphatic: There is moderate atherosclerotic calcification of the abdominal aorta without aneurysm. There is no gastrohepatic or hepatoduodenal ligament lymphadenopathy. No retroperitoneal or mesenteric lymphadenopathy. No pelvic sidewall lymphadenopathy. Reproductive: Fibroids noted in the uterus. There is no adnexal mass. Other: No intraperitoneal free fluid. Musculoskeletal: No worrisome lytic or sclerotic osseous abnormality. Small epigastric midline ventral hernia approximately 2-3 cm cranial to the umbilicus contains only fat. IMPRESSION: 1. No acute findings in the abdomen or pelvis. Specifically, no findings to explain the patient's history of nausea and vomiting. 2. The peripancreatic edema/inflammation seen on the previous study has resolved. Small volume free fluid seen on the previous study has also resolved in the interval. 3. Hepatic steatosis. 4. Cholelithiasis. 5. Uterine fibroids. 6.  Aortic Atherosclerosis (ICD10-I70.0). Electronically Signed   By: Donnal Fusi M.D.   On: 07/20/2023 05:40     Discharge Instructions: Discharge Instructions     Ambulatory referral to Physical Therapy   Complete by: As directed    Iontophoresis - 4 mg/ml of dexamethasone : No   T.E.N.S. Unit Evaluation and Dispense as Indicated: No   Call MD for:  difficulty breathing, headache or visual disturbances   Complete by: As directed    Call MD for:  extreme fatigue   Complete by: As directed    Call MD for:  hives   Complete by: As directed    Call MD for:  persistant dizziness or light-headedness   Complete by: As directed    Call MD for:  persistant nausea and vomiting   Complete by: As directed    Call MD for:  redness, tenderness, or signs of infection (pain, swelling, redness, odor or green/yellow discharge around incision site)   Complete by: As directed    Call MD for:  severe uncontrolled pain  Complete by: As directed    Call MD for:   temperature >100.4   Complete by: As directed    Diet - low sodium heart healthy   Complete by: As directed    Discharge instructions   Complete by: As directed    You came to the hospital for tingling of your hands and you were diagnosed with alcoholic ketoacidosis.  We treated you with IV fluids and alcohol cessation.   We have started you on the following medications:  -Naltrexone  50 mg daily: This medication helps decrease your alcohol cravings  -Tylenol  take 650 mg every 6 hours as needed for pain -magnesium  oxide 400 (240 Mg) MG tablet, take daily for low magnesium   -thiamine  100 MG tablet: take daily  -vitamin D3 25 MCG tablet take one pill daily for low vitamin D      We have stopped the following medications: -ergocalciferol  1.25 MG (50000 UT) capsule  -fluticasone  50 MCG/ACT nasal spray    The best thing you can do for your self is to continue to avoid ANY alcohol intake!    Follow-up appointments: Please visit your family doctor on June 11th at 9:15    If you have any questions or concerns please feel free to call: Internal medicine clinic at [phone]   If you have any of these following symptoms, please call us  or seek care at an emergency department: -Chest Pain -Difficulty Breathing -Syncope (passing out) -Drooping of face -Slurred speech -Sudden weakness in your leg or arm -Fever -Chills   We are glad that you are feeling better, it was a pleasure to care for you!  Aurora Lees DO   Increase activity slowly   Complete by: As directed        Signed: Aurora Lees DO [facility] - PGY1 Pager: [phone] 07/27/2023, 3:16 PM    Please contact the on call pager after 5 pm and on weekends at [phone].

## 2023-07-27 NOTE — Progress Notes (Signed)
 Occupational Therapy Treatment Patient Details Name: Christina Byrd MRN: [id number] DOB: Jul 16, 1965 Today's Date: 07/27/2023   History of present illness 58 y.o. female presents to [facility] hospital on 07/19/2023 with tremors, nausea and vomiting. PMH includes alcohol abuse, alcoholic pancreatitis, seizure disorder, Wernicke's encephalopathy.   OT comments  Pt progressing toward goals, ambulating hallway distance with supervision, completing bed mobility without assist. Pt reports she has got up to [facility] multiple times independently. Pt reports she has assist from spouse at home. Pt presenting with impairments listed below, will follow acutely. Anticipate no OT follow up needs at d/c.       If plan is discharge home, recommend the following:  Assistance with cooking/housework;Assist for transportation;Help with stairs or ramp for entrance;A little help with walking and/or transfers;A little help with bathing/dressing/bathroom   Equipment Recommendations  Tub/shower seat (if pt does not already have)    Recommendations for Other Services      Precautions / Restrictions Precautions Precautions: Fall Recall of Precautions/Restrictions: Intact Restrictions Weight Bearing Restrictions Per Provider Order: No       Mobility Bed Mobility Overal bed mobility: Independent                  Transfers Overall transfer level: Needs assistance Equipment used: None Transfers: Sit to/from Stand Sit to Stand: Supervision                 Balance Overall balance assessment: Needs assistance Sitting-balance support: No upper extremity supported, Feet supported, Feet unsupported Sitting balance-Leahy Scale: Good Sitting balance - Comments: sitting EOB   Standing balance support: No upper extremity supported, During functional activity Standing balance-Leahy Scale: Fair Standing balance comment: no LOB or buckling without AD, supervision for safety.                            ADL either performed or assessed with clinical judgement   ADL Overall ADL's : Needs assistance/impaired                         Toilet Transfer: Supervision/safety;Ambulation;Regular Toilet           Functional mobility during ADLs: Supervision/safety      Extremity/Trunk Assessment Upper Extremity Assessment Upper Extremity Assessment: Right hand dominant RUE Deficits / Details: decreased shoulder ROM, pt reporting at baseline; generalized weakness LUE Deficits / Details: decreased shoulder ROM, pt reporting at baseline; generalized weakness   Lower Extremity Assessment Lower Extremity Assessment: Defer to PT evaluation        Vision       Perception Perception Perception: Not tested   Praxis Praxis Praxis: Not tested   Communication Communication Communication: No apparent difficulties   Cognition Arousal: Alert Behavior During Therapy: WFL for tasks assessed/performed Cognition: No apparent impairments                               Following commands: Intact Following commands impaired: Follows one step commands with increased time      Cueing   Cueing Techniques: Verbal cues  Exercises      Shoulder Instructions       General Comments VSS    Pertinent Vitals/ Pain       Pain Assessment Pain Assessment: No/denies pain  Home Living  Prior Functioning/Environment              Frequency  Min 2X/week        Progress Toward Goals  OT Goals(current goals can now be found in the care plan section)  Progress towards OT goals: Progressing toward goals  Acute Rehab OT Goals Patient Stated Goal: none stated OT Goal Formulation: With patient Time For Goal Achievement: 08/04/23 Potential to Achieve Goals: Good  Plan      Co-evaluation                 AM-PAC OT "6 Clicks" Daily Activity     Outcome Measure   Help from another person eating  meals?: None Help from another person taking care of personal grooming?: A Little Help from another person toileting, which includes using toliet, bedpan, or urinal?: A Little Help from another person bathing (including washing, rinsing, drying)?: A Little Help from another person to put on and taking off regular upper body clothing?: A Little Help from another person to put on and taking off regular lower body clothing?: A Little 6 Click Score: 19    End of Session    OT Visit Diagnosis: Muscle weakness (generalized) (M62.81);Unsteadiness on feet (R26.81)   Activity Tolerance Patient tolerated treatment well   Patient Left in bed;with call bell/phone within reach   Nurse Communication Mobility status        Time: 1610-9604 OT Time Calculation (min): 13 min  Charges: OT General Charges $OT Visit: 1 Visit OT Treatments $Therapeutic Activity: 8-22 mins   Christina Byrd, OTD, OTR/L SecureChat Preferred Acute Rehab [phone]   Christina Byrd 07/27/2023, 11:47 AM

## 2023-07-27 NOTE — TOC Transition Note (Addendum)
 Transition of Care [facility]) - Discharge Note   Patient Details  Name: Christina Byrd MRN: [id number] Date of Birth: November 29, 1965  Transition of Care [facility] CM/SW Contact:  Dane Dung, RN Phone Number: 07/27/2023, 11:32 AM   Clinical Narrative:    CM spoke with Dr. Sherrod Dolphin, UR MD and patient's inpatient stay has been declined by the insurance provider.  Patient is ambulating 200 ft with walker and will not qualify for SNF placement.   I spoke with the patient at the bedside and made her aware that she is stable and will likely be discharged home with her spouse today.  Patient states that she has a RW at the home already and declines a WC.   Patient states that she plans to call her spouse to provide transportation to home once discharge is placed by the attending team.  Patient was set up for OUtpatient services for therapy.  OPPT orders was placed and attending team was requested to co-sign.  Resources provided in the AVS for patient to follow up regarding ETOH abuse counseling.  Patient can discharge home by family when ready for discharge home by car today.     Barriers to Discharge: Continued Medical Work up   Patient Goals and CMS Choice Patient states their goals for this hospitalization and ongoing recovery are:: SNF   Choice offered to / list presented to : Patient      Discharge Placement                       Discharge Plan and Services Additional resources added to the After Visit Summary for   In-house Referral: Clinical Social Work                                   Social Drivers of Health (SDOH) Interventions SDOH Screenings   Food Insecurity: No Food Insecurity (07/20/2023)  Housing: Low Risk  (07/20/2023)  Transportation Needs: No Transportation Needs (07/20/2023)  Utilities: Not At Risk (07/20/2023)  Depression (PHQ2-9): Low Risk  (06/30/2023)  Tobacco Use: Medium Risk (07/19/2023)     Readmission Risk Interventions     06/22/2023    2:16 PM 11/21/2022    1:01 PM  Readmission Risk Prevention Plan  Transportation Screening Complete Complete  Medication Review Oceanographer) Complete Complete  PCP or Specialist appointment within 3-5 days of discharge Complete Complete  HRI or Home Care Consult Complete Complete  SW Recovery Care/Counseling Consult Complete   Palliative Care Screening Not Applicable Not Applicable  Skilled Nursing Facility Patient Refused Not Applicable

## 2023-07-28 ENCOUNTER — Telehealth: Payer: Self-pay

## 2023-07-28 NOTE — Transitions of Care (Post Inpatient/ED Visit) (Signed)
   07/28/2023  Name: Christina Byrd MRN: [id number] DOB: February 05, 1966  Today's TOC FU Call Status: Today's TOC FU Call Status:: Unsuccessful Call (1st Attempt) Unsuccessful Call (1st Attempt) Date: 07/28/23  Attempted to reach the patient regarding the most recent Inpatient/ED visit.  Follow Up Plan: Additional outreach attempts will be made to reach the patient to complete the Transitions of Care (Post Inpatient/ED visit) call.   Signature Darrall Ellison, LPN [facility] Nurse Health Advisor Direct Dial [phone]

## 2023-08-03 ENCOUNTER — Encounter: Admitting: Internal Medicine

## 2023-08-04 ENCOUNTER — Telehealth: Payer: Self-pay | Admitting: *Deleted

## 2023-08-04 NOTE — Transitions of Care (Post Inpatient/ED Visit) (Signed)
 08/04/2023  Name: Christina Byrd MRN: [id number] DOB: 04-22-1965  Today's TOC FU Call Status: Today's TOC FU Call Status:: Successful TOC FU Call Completed Unsuccessful Call (1st Attempt) Date: 07/28/23 [facility] FU Call Complete Date: 08/04/23 Patient's Name and Date of Birth confirmed.  Transition Care Management Follow-up Telephone Call Date of Discharge: 07/30/23 Type of Discharge: Inpatient Admission How have you been since you were released from the hospital?: Same Any questions or concerns?: No  Items Reviewed: Did you receive and understand the discharge instructions provided?: Yes Medications obtained,verified, and reconciled?: Yes (Medications Reviewed) Any new allergies since your discharge?: No Dietary orders reviewed?: Yes Do you have support at home?: Yes People in Home [RPT]: spouse  Medications Reviewed Today: Medications Reviewed Today     Reviewed by Darrall Ellison, LPN (Licensed Practical Nurse) on 08/04/23 at 1537  Med List Status: <None>   Medication Order Taking? Sig Documenting Provider Last Dose Status Informant  acetaminophen  (TYLENOL ) 325 MG tablet [id number] Yes Take 2 tablets (650 mg total) by mouth every 6 (six) hours as needed for mild pain (pain score 1-3) or fever (or Fever >/= 101). Aurora Lees, DO  Active   allopurinol  (ZYLOPRIM ) 100 MG tablet [id number] Yes Take 2 tablets (200 mg total) by mouth daily. Carleen Chary, DO  Active Self, Spouse/Significant Other, Pharmacy Records  atorvastatin  (LIPITOR) 20 MG tablet [id number] Yes Take 1 tablet (20 mg total) by mouth daily. Carleen Chary, DO  Active Self, Spouse/Significant Other, Pharmacy Records  cetirizine  (ZYRTEC  ALLERGY) 10 MG tablet [id number] Yes Take 1 tablet (10 mg total) by mouth daily. Carleen Chary, DO  Active Self, Spouse/Significant Other, Pharmacy Records  DULoxetine  (CYMBALTA ) 30 MG capsule [id number] Yes Take 1 capsule (30 mg total) by mouth daily. Carleen Chary,  DO  Active Self, Spouse/Significant Other, Pharmacy Records  etodolac  (LODINE ) 400 MG tablet [id number] Yes Take 1 tablet (400 mg total) by mouth 2 (two) times daily. Carleen Chary, DO  Active Self, Spouse/Significant Other, Pharmacy Records  feeding supplement (ENSURE ENLIVE / ENSURE PLUS) LIQD [id number] Yes Take 237 mLs by mouth 3 (three) times daily between meals.  Patient taking differently: Take 237 mLs by mouth daily as needed (for meal replacement).   Jackolyn Masker, MD  Active Self, Spouse/Significant Other  folic acid  (FOLVITE ) 1 MG tablet [id number] Yes Take 1 tablet (1 mg total) by mouth daily. Carleen Chary, DO  Active Self, Spouse/Significant Other, Pharmacy Records  gabapentin  (NEURONTIN ) 100 MG capsule [id number] Yes Take 1 capsule (100 mg total) by mouth 3 (three) times daily. Aurora Lees, DO  Active Self, Spouse/Significant Other           Med Note (WHITE, TONIA S   Wed Jul 20, 2023  7:40 AM) Unable to verify that the prescription was ever filled.  levETIRAcetam  (KEPPRA ) 750 MG tablet [id number] Yes Take 1 tablet (750 mg total) by mouth 2 (two) times daily. Carleen Chary, DO  Active Self, Spouse/Significant Other, Pharmacy Records  magnesium  oxide (MAG-OX) 400 (240 Mg) MG tablet [id number] Yes Take 1 tablet (400 mg total) by mouth daily. Aurora Lees, DO  Active     Discontinued 04/02/20 1650 (Completed Course)   Multiple Vitamin (MULTIVITAMIN WITH MINERALS) TABS tablet [id number] Yes Take 1 tablet by mouth daily. Carleen Chary, DO  Active Self, Spouse/Significant Other  naltrexone  (DEPADE) 50 MG tablet [id number] Yes Take 1 tablet (50 mg total) by mouth daily. Aurora Lees, DO  Active   ondansetron  (ZOFRAN ) 4 MG tablet [id number] Yes Take 1  tablet (4 mg total) by mouth every 8 (eight) hours as needed for nausea or vomiting. Carleen Chary, DO  Active Self, Spouse/Significant Other, Pharmacy Records  pantoprazole  (PROTONIX ) 40 MG tablet [id number] Yes Take 1  tablet (40 mg total) by mouth 2 (two) times daily. Carleen Chary, DO  Active Self, Spouse/Significant Other, Pharmacy Records  thiamine  (VITAMIN B1) 100 MG tablet [id number] Yes Take 1 tablet (100 mg total) by mouth daily. Aurora Lees, DO  Active   vitamin D3 (CHOLECALCIFEROL ) 25 MCG tablet [id number] Yes Take 1 tablet (1,000 Units total) by mouth daily. Aurora Lees, DO  Active             Home Care and Equipment/Supplies: Were Home Health Services Ordered?: NA Any new equipment or medical supplies ordered?: NA  Functional Questionnaire: Do you need assistance with bathing/showering or dressing?: No Do you need assistance with meal preparation?: No Do you need assistance with eating?: No Do you have difficulty maintaining continence: No Do you need assistance with getting out of bed/getting out of a chair/moving?: No Do you have difficulty managing or taking your medications?: No  Follow up appointments reviewed: PCP Follow-up appointment confirmed?: Yes Date of PCP follow-up appointment?: 08/08/23 Follow-up Provider: [facility] Follow-up appointment confirmed?: NA Do you need transportation to your follow-up appointment?: No Do you understand care options if your condition(s) worsen?: Yes-patient verbalized understanding    SIGNATURE Darrall Ellison, LPN [facility] Nurse Health Advisor Direct Dial [phone]

## 2023-08-04 NOTE — Telephone Encounter (Signed)
 Copied from CRM [id number]. Topic: Clinical - Medical Advice >> Aug 04, 2023  2:47 PM Christina Byrd wrote: Reason for CRM:   Patient 's spouse, Mr. Christina Byrd, called in and stating that he wanted to inform  that the patient  has started drinking alcohol (brandy) continuously; Having trouble with mobility; feeling weakness in her body; Patient's spouse declined triage.  Patient is currently having trouble dressing herself and taking care of herself independently.   Callback Number: [phone]

## 2023-08-07 ENCOUNTER — Other Ambulatory Visit: Payer: Self-pay | Admitting: Student

## 2023-08-07 DIAGNOSIS — R112 Nausea with vomiting, unspecified: Secondary | ICD-10-CM

## 2023-08-08 ENCOUNTER — Inpatient Hospital Stay: Payer: Self-pay | Admitting: Internal Medicine

## 2023-08-15 ENCOUNTER — Ambulatory Visit: Admitting: Student

## 2023-08-15 ENCOUNTER — Ambulatory Visit (INDEPENDENT_AMBULATORY_CARE_PROVIDER_SITE_OTHER): Payer: Self-pay | Admitting: Licensed Clinical Social Worker

## 2023-08-15 ENCOUNTER — Telehealth (INDEPENDENT_AMBULATORY_CARE_PROVIDER_SITE_OTHER): Payer: Self-pay | Admitting: Licensed Clinical Social Worker

## 2023-08-15 ENCOUNTER — Encounter: Payer: Self-pay | Admitting: Student

## 2023-08-15 VITALS — BP 122/77 | HR 111 | Temp 98.0°F | Ht 62.0 in | Wt 101.8 lb

## 2023-08-15 DIAGNOSIS — E559 Vitamin D deficiency, unspecified: Secondary | ICD-10-CM

## 2023-08-15 DIAGNOSIS — F1010 Alcohol abuse, uncomplicated: Secondary | ICD-10-CM | POA: Diagnosis not present

## 2023-08-15 DIAGNOSIS — E872 Acidosis, unspecified: Secondary | ICD-10-CM | POA: Diagnosis not present

## 2023-08-15 DIAGNOSIS — E46 Unspecified protein-calorie malnutrition: Secondary | ICD-10-CM | POA: Diagnosis not present

## 2023-08-15 DIAGNOSIS — D649 Anemia, unspecified: Secondary | ICD-10-CM

## 2023-08-15 DIAGNOSIS — E876 Hypokalemia: Secondary | ICD-10-CM | POA: Diagnosis not present

## 2023-08-15 DIAGNOSIS — G621 Alcoholic polyneuropathy: Secondary | ICD-10-CM | POA: Diagnosis not present

## 2023-08-15 DIAGNOSIS — E039 Hypothyroidism, unspecified: Secondary | ICD-10-CM | POA: Diagnosis present

## 2023-08-15 NOTE — Patient Instructions (Addendum)
 Daily [employer] [facility] [employer] Location: [address] Time: 8:00 AM - 9:00 AM Format: Open Discussion Note: These meetings are also available online.  Evening Literature-Based Meetings [employer] Days: Monday, Tuesday, Wednesday, Sunday Time: 6:00 PM - 7:00 PM Format: Literature Study (Big Book, 12 & 12) Note: Open to all, including newcomers. Step [place]ively Day: Tuesday Time: 7:30 PM - 8:30 PM Format: Step & Tradition Study Note: Closed meeting (for those with a desire to stop drinking). [place] Day: Sunday Time: 7:30 PM - 8:30 PM Format: Big Book Study Note: Open to all.  Electrical engineer: [phone] [facility] AA Intergroup: [email or web address]  Phone: [phone] 24/7 National AA Hotline: [phone]

## 2023-08-15 NOTE — Telephone Encounter (Signed)
 [facility] attempted patient on today due to patient not showing for 1:30 pm in-person appointment at [facility]. [facility] was attempted to do the consult via telehealth/ telephone. [facility] unable to leave VM due to phone continuing to ring. Client will need to contact agency back to reschedule.   Renda Pontes, MSW, LCSW-A She/Her Behavioral Health Clinician [facility]  Internal Medicine Center

## 2023-08-15 NOTE — Progress Notes (Unsigned)
 Patient name: Christina Byrd Date of birth: 06/25/1965 Date of visit: 08/15/23  Subjective   Chief concern: I need to know what all these medications are for  She was hospitalized from 5/28-6/4 for complications from alcohol use including ketoacidosis and anemia.  She was under-nourished as evidenced by electrolyte and vitamin deficiencies.  Since discharge she's been doing okay. She doesn't eat very much due to poor appetite. She brought her medications with her today, including the medications that were prescribed at discharge, although she hasn't started these yet.  She continues to drink but doesn't think it is causing problems for her health.  Review of Systems  Constitutional:  Positive for weight loss.       Poor appetite  Gastrointestinal:  Negative for blood in stool.  Genitourinary:  Negative for hematuria.  Skin:  Positive for rash (dry skin on palms and soles).    Current Outpatient Medications  Medication Instructions   acetaminophen  (TYLENOL ) 650 mg, Oral, Every 6 hours PRN   allopurinol  (ZYLOPRIM ) 200 mg, Oral, Daily   atorvastatin  (LIPITOR) 20 mg, Oral, Daily   cetirizine  (ZYRTEC  ALLERGY) 10 mg, Oral, Daily   DULoxetine  (CYMBALTA ) 30 mg, Oral, Daily   etodolac  (LODINE ) 400 mg, Oral, 2 times daily   feeding supplement (ENSURE ENLIVE / ENSURE PLUS) LIQD 237 mLs, Oral, 3 times daily between meals   folic acid  (FOLVITE ) 1 mg, Oral, Daily   gabapentin  (NEURONTIN ) 100 mg, Oral, 3 times daily   levETIRAcetam  (KEPPRA ) 750 MG tablet TAKE 1 TABLET(750 MG) BY MOUTH TWICE DAILY   magnesium  oxide (MAG-OX) 400 mg, Oral, Daily   Multiple Vitamin (MULTIVITAMIN WITH MINERALS) TABS tablet 1 tablet, Oral, Daily   naltrexone  (DEPADE) 50 mg, Oral, Daily   ondansetron  (ZOFRAN ) 4 mg, Oral, Every 8 hours PRN   pantoprazole  (PROTONIX ) 40 mg, Oral, 2 times daily   thiamine  (VITAMIN B1) 100 mg, Oral, Daily   vitamin D3 (CHOLECALCIFEROL ) 1,000 Units, Oral, Daily     Objective   Today's Vitals   08/15/23 1532  BP: 122/77  Pulse: (!) 111  Temp: 98 F (36.7 C)  TempSrc: Oral  SpO2: 97%  Weight: 101 lb 12.8 oz (46.2 kg)  Height: 5' 2 (1.575 m)  Body mass index is 18.62 kg/m.   Physical Exam Constitutional:      General: She is not in acute distress.    Comments: Frail appearing  Neck:     Thyroid: No thyroid mass, thyromegaly or thyroid tenderness.   Cardiovascular:     Rate and Rhythm: Regular rhythm. Tachycardia present.     Pulses: Normal pulses.     Heart sounds: No murmur heard. Pulmonary:     Effort: Pulmonary effort is normal.     Breath sounds: Normal breath sounds. No wheezing or rales.   Musculoskeletal:     Right lower leg: No edema.     Left lower leg: No edema.  Lymphadenopathy:     Cervical: No cervical adenopathy.   Skin:    General: Skin is warm and dry.     Comments: Desquamation over palms without erythema   Neurological:     Mental Status: She is alert. Mental status is at baseline.     Cranial Nerves: No facial asymmetry.     Motor: No tremor.   Psychiatric:        Mood and Affect: Mood normal.        Behavior: Behavior normal.      Assessment & Plan  Problem  List Items Addressed This Visit     Anemia   From marrow suppression due to under-nourishment, probably also alcohol use. No more bleeding, hemoglobin is stable.      Relevant Orders   CBC no Diff (Completed)   Malnutrition (HCC)   As evidenced by decreased appetite, weight loss, and associated hypokalemia, hypomagnesemia, vitamin D  deficiency. She denies food insecurity, just doesn't each much due to low appetite. Supplement deficient nutrients while increasing food intake.      Hypothyroidism - Primary   Overt hypothyroidism with elevated TSH and low free T4. This may explain some of her poor appetite, maybe her desquamation from palms. Starting levothyroxine 25 mcg daily.      Relevant Orders   TSH (Completed)   T4, Free (Completed)   Vitamin D   deficiency   Start vitamin D  1,000 units daily.      Relevant Orders   Vitamin D  (25 hydroxy) (Completed)   Alcoholic peripheral neuropathy (HCC)   Declines to start naltrexone  at this time, doesn't think alcohol use is a risk to her health. I recommend ongoing efforts for cessation.      RESOLVED: Lactic acidosis   Relevant Orders   BMP8+Anion Gap (Completed)   Hypomagnesemia   Start magnesium  oxide.      Relevant Orders   Magnesium  (Completed)   Hypokalemia   Start potassium chloride  via packet since she has trouble with large pills.      No follow-ups on file.  Ozell Kung MD 08/15/2023, 3:50 PM

## 2023-08-16 ENCOUNTER — Ambulatory Visit: Payer: Self-pay | Admitting: Student

## 2023-08-16 DIAGNOSIS — E876 Hypokalemia: Secondary | ICD-10-CM | POA: Insufficient documentation

## 2023-08-16 LAB — BMP8+ANION GAP

## 2023-08-16 MED ORDER — POTASSIUM CHLORIDE CRYS ER 10 MEQ PO TBCR: 10.0000 meq | EXTENDED_RELEASE_TABLET | Freq: Every day | ORAL | 3 refills | Status: DC

## 2023-08-16 MED ORDER — POTASSIUM CHLORIDE 20 MEQ PO PACK: 20.0000 meq | PACK | Freq: Once | ORAL | 3 refills | Status: DC

## 2023-08-16 MED ORDER — LEVOTHYROXINE SODIUM 25 MCG PO TABS: 25.0000 ug | ORAL_TABLET | Freq: Every day | ORAL | 3 refills | Status: DC

## 2023-08-16 NOTE — Telephone Encounter (Signed)
 Spoke on the phone. Changed potassium pills to packets since she struggles to swallow these. Started levothyroxine 25 mcg daily. Requested she follow-up in 6 weeks for repeat TSH.  Ozell Kung MD 08/16/2023, 5:16 PM

## 2023-08-16 NOTE — Patient Instructions (Signed)
 Return in 4-6 weeks for thyroid check.  Remember to bring all of the medications that you take (including over the counter medications and supplements) with you to every clinic visit.  This after visit summary is an important review of tests, referrals, and medication changes that were discussed during your visit. If you have questions or concerns, call [phone]. Outside of clinic business hours, call the main hospital at [phone] and ask the operator for the on-call internal medicine resident.   Ozell Kung MD 08/16/2023, 10:09 PM

## 2023-08-16 NOTE — Assessment & Plan Note (Signed)
 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 This may explain some of her poor appetite, maybe her desquamation from palms. Starting levothyroxine 25 mcg daily.

## 2023-08-16 NOTE — Assessment & Plan Note (Signed)
 From marrow suppression due to under-nourishment, probably also alcohol use. No more bleeding, hemoglobin is stable.

## 2023-08-16 NOTE — Assessment & Plan Note (Signed)
 Start potassium chloride  via packet since she has trouble with large pills.

## 2023-08-16 NOTE — Assessment & Plan Note (Signed)
Start vitamin D 1,000 units daily

## 2023-08-16 NOTE — Assessment & Plan Note (Signed)
 As evidenced by decreased appetite, weight loss, and associated hypokalemia, hypomagnesemia, vitamin D  deficiency. She denies food insecurity, just doesn't each much due to low appetite. Supplement deficient nutrients while increasing food intake.

## 2023-08-16 NOTE — Assessment & Plan Note (Signed)
Start magnesium oxide

## 2023-08-16 NOTE — Assessment & Plan Note (Signed)
 Declines to start naltrexone  at this time, doesn't think alcohol use is a risk to her health. I recommend ongoing efforts for cessation.

## 2023-08-17 ENCOUNTER — Telehealth: Payer: Self-pay

## 2023-08-17 ENCOUNTER — Other Ambulatory Visit: Payer: Self-pay | Admitting: Student

## 2023-08-17 DIAGNOSIS — E876 Hypokalemia: Secondary | ICD-10-CM

## 2023-08-17 DIAGNOSIS — E559 Vitamin D deficiency, unspecified: Secondary | ICD-10-CM

## 2023-08-17 MED ORDER — POTASSIUM CHLORIDE 20 MEQ PO PACK: 20.0000 meq | PACK | Freq: Every day | ORAL | 3 refills | Status: DC

## 2023-08-17 MED ORDER — CHOLECALCIFEROL 1.25 MG (50000 UT) PO CAPS: 50000.0000 [IU] | ORAL_CAPSULE | ORAL | 0 refills | Status: AC

## 2023-08-17 NOTE — Telephone Encounter (Signed)
 Received a fax from the pharmacy for clarification  Medication:Potassium Chloride   Message from pharmacy: Please clarify if you want the patient to be taking this medication more that just once for 1 dose as the directions state. Thanks!

## 2023-08-17 NOTE — Telephone Encounter (Signed)
 Updated prescription to reflect the correct instructions, 20 mEq daily of potassium chloride .

## 2023-08-17 NOTE — Progress Notes (Signed)
 Discontinuing low dose vitamin D3 in favor of 50,000 units weekly.

## 2023-08-19 NOTE — BH Specialist Note (Signed)
 Integrated Behavioral Health Initial In-Person Visit  MRN: [id number] Name: Christina Byrd  Number of Integrated Behavioral Health Clinician visits: 1- Initial Visit  Session Start time: 1330    Session End time: 1430  Total time in minutes: 60    Types of Service: General Behavioral Integrated Care (BHI)  Interpretor:No. Interpretor Name and Language: N/A   Subjective: Christina Byrd is a 58 y.o. female accompanied by Spouse Patient was referred by PCP for [facility]. Patient reports the following symptoms/concerns: The Licensed Clinical Engineer, building services (LCSW-A), acting as a Visual merchandiser [facility]), initiated a session with patient. The [facility] introduced themselves, explained her role, and provided contact information to the patient. Confidentiality and mandated reporting were discussed, and the patient denied any suicidal ideations or intent to harm others. The Integrated Behavioral Health (IBH) approach was reviewed, and a PHQ-9 assessment was completed.   Sessions disucussed patients substance abuse. Patient denies substance abuse and stated she has slowed down a lot. Patient denied wanting any services for Alcohol Abuse. [facility] and patient discussed AA meetings. Patient declined but stated she would take a list of agency close by she could attend.  Session concluded with [facility] advising patient to contact agency back if she would like to start services. Patient agreed. Duration of problem: more than 5 years; Severity of problem: mild  Objective: Mood: Irritable and Affect: Blunt Risk of harm to self or others: No plan to harm self or others  Life Context: Family and Social: Patient refers to live in partner as husband. School/Work: Not discussed during encounter Self-Care: Not discussed during encounter Life Changes: Not discussed during encounter  Patient and/or Family's Strengths/Protective Factors: Sense of purpose  Goals Addressed: Patient will: Reduce  symptoms of: Alcoholism    Progress towards Goals: Discontinued  Interventions: Interventions utilized: Link to [employer]  Standardized Assessments completed: Patient declined screening     Patient and/or Family Response: Patient declined needing services at this time but understand she can return when ready.  Patient Centered Plan: Patient is on the following Treatment Plan(s):  Follow up with AA meetings  Clinical Assessment/Diagnosis  Alcohol abuse   Patient may benefit from AA meetings and substance abuse treatment.  Plan: Follow up with behavioral health clinician on : No follow up at this time  Christina Byrd, MSW, LCSW-A She/Her Behavioral Health Clinician [facility]  Internal Medicine Center

## 2023-08-22 NOTE — Progress Notes (Signed)
 Internal Medicine Clinic Attending  Case discussed with the resident at the time of the visit.  We reviewed the resident's history and exam and pertinent patient test results.  I agree with the assessment, diagnosis, and plan of care documented in the resident's note.    I have messaged our clinic staff to arrange a physician follow-up appointment in 4 weeks

## 2023-08-23 LAB — BMP8+ANION GAP
BUN/Creatinine Ratio: 7 — ABNORMAL LOW (ref 9–23)
BUN: 8 mg/dL (ref 6–24)
Calcium: 8.2 mg/dL — ABNORMAL LOW (ref 8.7–10.2)
Chloride: 103 mmol/L (ref 96–106)
Creatinine, Ser: 1.11 mg/dL — ABNORMAL HIGH (ref 0.57–1.00)
Glucose: 98 mg/dL (ref 70–99)
Potassium: 3.2 mmol/L — ABNORMAL LOW (ref 3.5–5.2)
Sodium: 142 mmol/L (ref 134–144)
eGFR: 58 mL/min/{1.73_m2} — ABNORMAL LOW (ref >59)

## 2023-08-23 LAB — CBC
Hematocrit: 32.2 % — ABNORMAL LOW (ref 34.0–46.6)
Hemoglobin: 10.2 g/dL — ABNORMAL LOW (ref 11.1–15.9)
MCH: 30.9 pg (ref 26.6–33.0)
MCHC: 31.7 g/dL (ref 31.5–35.7)
MCV: 98 fL — ABNORMAL HIGH (ref 79–97)
Platelets: 244 10*3/uL (ref 150–450)
RBC: 3.3 x10E6/uL — ABNORMAL LOW (ref 3.77–5.28)
RDW: 15.4 % (ref 11.7–15.4)
WBC: 6 10*3/uL (ref 3.4–10.8)

## 2023-08-23 LAB — MAGNESIUM: Magnesium: 1.1 mg/dL — ABNORMAL LOW (ref 1.6–2.3)

## 2023-08-23 LAB — TSH: TSH: 29.9 u[IU]/mL — ABNORMAL HIGH (ref 0.450–4.500)

## 2023-08-23 LAB — T4, FREE: Free T4: 0.38 ng/dL — ABNORMAL LOW (ref 0.82–1.77)

## 2023-08-23 LAB — VITAMIN D 25 HYDROXY (VIT D DEFICIENCY, FRACTURES): Vit D, 25-Hydroxy: 14.5 ng/mL — ABNORMAL LOW (ref 30.0–100.0)

## 2023-08-25 ENCOUNTER — Encounter (HOSPITAL_COMMUNITY): Payer: Self-pay

## 2023-08-25 ENCOUNTER — Emergency Department (HOSPITAL_COMMUNITY)

## 2023-08-25 ENCOUNTER — Observation Stay (HOSPITAL_COMMUNITY)

## 2023-08-25 ENCOUNTER — Other Ambulatory Visit: Payer: Self-pay

## 2023-08-25 ENCOUNTER — Observation Stay (HOSPITAL_COMMUNITY)
Admission: EM | Admit: 2023-08-25 | Discharge: 2023-08-26 | Disposition: A | Discharge status: Home / Self Care | Attending: Emergency Medicine | Admitting: Emergency Medicine

## 2023-08-25 DIAGNOSIS — R569 Unspecified convulsions: Secondary | ICD-10-CM | POA: Diagnosis not present

## 2023-08-25 DIAGNOSIS — D696 Thrombocytopenia, unspecified: Secondary | ICD-10-CM | POA: Diagnosis not present

## 2023-08-25 DIAGNOSIS — G40909 Epilepsy, unspecified, not intractable, without status epilepticus: Secondary | ICD-10-CM | POA: Diagnosis not present

## 2023-08-25 DIAGNOSIS — E8809 Other disorders of plasma-protein metabolism, not elsewhere classified: Secondary | ICD-10-CM | POA: Insufficient documentation

## 2023-08-25 DIAGNOSIS — G9389 Other specified disorders of brain: Secondary | ICD-10-CM | POA: Diagnosis not present

## 2023-08-25 DIAGNOSIS — F10239 Alcohol dependence with withdrawal, unspecified: Secondary | ICD-10-CM | POA: Insufficient documentation

## 2023-08-25 DIAGNOSIS — K7030 Alcoholic cirrhosis of liver without ascites: Secondary | ICD-10-CM | POA: Diagnosis not present

## 2023-08-25 DIAGNOSIS — E039 Hypothyroidism, unspecified: Secondary | ICD-10-CM | POA: Insufficient documentation

## 2023-08-25 DIAGNOSIS — D649 Anemia, unspecified: Secondary | ICD-10-CM | POA: Diagnosis not present

## 2023-08-25 DIAGNOSIS — Z87898 Personal history of other specified conditions: Secondary | ICD-10-CM | POA: Diagnosis not present

## 2023-08-25 DIAGNOSIS — K219 Gastro-esophageal reflux disease without esophagitis: Secondary | ICD-10-CM | POA: Insufficient documentation

## 2023-08-25 DIAGNOSIS — E43 Unspecified severe protein-calorie malnutrition: Secondary | ICD-10-CM | POA: Diagnosis not present

## 2023-08-25 DIAGNOSIS — K760 Fatty (change of) liver, not elsewhere classified: Secondary | ICD-10-CM | POA: Insufficient documentation

## 2023-08-25 DIAGNOSIS — R410 Disorientation, unspecified: Secondary | ICD-10-CM | POA: Diagnosis not present

## 2023-08-25 DIAGNOSIS — G621 Alcoholic polyneuropathy: Secondary | ICD-10-CM | POA: Diagnosis not present

## 2023-08-25 DIAGNOSIS — Z8659 Personal history of other mental and behavioral disorders: Secondary | ICD-10-CM | POA: Insufficient documentation

## 2023-08-25 DIAGNOSIS — E790 Hyperuricemia without signs of inflammatory arthritis and tophaceous disease: Secondary | ICD-10-CM | POA: Diagnosis not present

## 2023-08-25 DIAGNOSIS — R4182 Altered mental status, unspecified: Secondary | ICD-10-CM | POA: Diagnosis not present

## 2023-08-25 DIAGNOSIS — R55 Syncope and collapse: Secondary | ICD-10-CM | POA: Diagnosis not present

## 2023-08-25 DIAGNOSIS — R000 Tachycardia, unspecified: Secondary | ICD-10-CM | POA: Diagnosis not present

## 2023-08-25 DIAGNOSIS — F10930 Alcohol use, unspecified with withdrawal, uncomplicated: Secondary | ICD-10-CM

## 2023-08-25 DIAGNOSIS — E876 Hypokalemia: Secondary | ICD-10-CM | POA: Diagnosis not present

## 2023-08-25 DIAGNOSIS — M129 Arthropathy, unspecified: Secondary | ICD-10-CM | POA: Diagnosis not present

## 2023-08-25 LAB — COMPREHENSIVE METABOLIC PANEL WITH GFR
ALT: 18 U/L (ref 0–44)
AST: 65 U/L — ABNORMAL HIGH (ref 15–41)
Albumin: 2.5 g/dL — ABNORMAL LOW (ref 3.5–5.0)
Alkaline Phosphatase: 101 U/L (ref 38–126)
Anion gap: 13 (ref 5–15)
BUN: 5 mg/dL — ABNORMAL LOW (ref 6–20)
CO2: 23 mmol/L (ref 22–32)
Calcium: 7.7 mg/dL — ABNORMAL LOW (ref 8.9–10.3)
Chloride: 100 mmol/L (ref 98–111)
Creatinine, Ser: 1.13 mg/dL — ABNORMAL HIGH (ref 0.44–1.00)
GFR, Estimated: 56 mL/min — ABNORMAL LOW (ref >60)
Glucose, Bld: 130 mg/dL — ABNORMAL HIGH (ref 70–99)
Potassium: 3.1 mmol/L — ABNORMAL LOW (ref 3.5–5.1)
Sodium: 136 mmol/L (ref 135–145)
Total Bilirubin: 0.7 mg/dL (ref 0.0–1.2)
Total Protein: 5.9 g/dL — ABNORMAL LOW (ref 6.5–8.1)

## 2023-08-25 LAB — CBC
HCT: 26.9 % — ABNORMAL LOW (ref 36.0–46.0)
Hemoglobin: 9.4 g/dL — ABNORMAL LOW (ref 12.0–15.0)
MCH: 30.8 pg (ref 26.0–34.0)
MCHC: 34.9 g/dL (ref 30.0–36.0)
MCV: 88.2 fL (ref 80.0–100.0)
Platelets: 82 10*3/uL — ABNORMAL LOW (ref 150–400)
RBC: 3.05 MIL/uL — ABNORMAL LOW (ref 3.87–5.11)
RDW: 15.2 % (ref 11.5–15.5)
WBC: 5.2 10*3/uL (ref 4.0–10.5)
nRBC: 0.6 % — ABNORMAL HIGH (ref 0.0–0.2)

## 2023-08-25 LAB — RENAL FUNCTION PANEL
Albumin: 2.3 g/dL — ABNORMAL LOW (ref 3.5–5.0)
Anion gap: 10 (ref 5–15)
BUN: <5 mg/dL — ABNORMAL LOW (ref 6–20)
CO2: 24 mmol/L (ref 22–32)
Calcium: 7.9 mg/dL — ABNORMAL LOW (ref 8.9–10.3)
Chloride: 104 mmol/L (ref 98–111)
Creatinine, Ser: 0.65 mg/dL (ref 0.44–1.00)
GFR, Estimated: >60 mL/min (ref >60)
Glucose, Bld: 89 mg/dL (ref 70–99)
Phosphorus: 1.5 mg/dL — ABNORMAL LOW (ref 2.5–4.6)
Potassium: 3.6 mmol/L (ref 3.5–5.1)
Sodium: 138 mmol/L (ref 135–145)

## 2023-08-25 LAB — ETHANOL: Alcohol, Ethyl (B): <15 mg/dL (ref <15)

## 2023-08-25 LAB — MAGNESIUM: Magnesium: 0.9 mg/dL — CL (ref 1.7–2.4)

## 2023-08-25 MED ORDER — LACTATED RINGERS IV BOLUS
1000.0000 mL | Freq: Once | INTRAVENOUS | Status: AC
  Administered 2023-08-25: 1000 mL via INTRAVENOUS

## 2023-08-25 MED ORDER — ADULT MULTIVITAMIN W/MINERALS CH: 1.0000 | ORAL_TABLET | Freq: Every day | ORAL | Status: DC

## 2023-08-25 MED ORDER — CHLORDIAZEPOXIDE HCL 25 MG PO CAPS: 25.0000 mg | ORAL_CAPSULE | Freq: Once | ORAL | Status: DC

## 2023-08-25 MED ORDER — POTASSIUM CHLORIDE 20 MEQ PO PACK
40.0000 meq | PACK | Freq: Once | ORAL | Status: DC
  Filled 2023-08-25: qty 2

## 2023-08-25 MED ORDER — THIAMINE HCL 100 MG/ML IJ SOLN: 100.0000 mg | Freq: Once | INTRAMUSCULAR | Status: DC

## 2023-08-25 MED ORDER — CHLORDIAZEPOXIDE HCL 25 MG PO CAPS: 25.0000 mg | ORAL_CAPSULE | Freq: Four times a day (QID) | ORAL | Status: DC | PRN

## 2023-08-25 MED ORDER — CHLORDIAZEPOXIDE HCL 25 MG PO CAPS
25.0000 mg | ORAL_CAPSULE | Freq: Four times a day (QID) | ORAL | Status: DC
Start: 2023-08-25 — End: 2023-08-26
  Administered 2023-08-26 (×2): 25 mg via ORAL
  Filled 2023-08-25 (×3): qty 1

## 2023-08-25 MED ORDER — POTASSIUM CHLORIDE 10 MEQ/100ML IV SOLN
10.0000 meq | INTRAVENOUS | Status: AC
  Administered 2023-08-25 – 2023-08-26 (×4): 10 meq via INTRAVENOUS
  Filled 2023-08-25 (×5): qty 100

## 2023-08-25 MED ORDER — ACETAMINOPHEN 325 MG PO TABS: 650.0000 mg | ORAL_TABLET | Freq: Four times a day (QID) | ORAL | Status: DC | PRN

## 2023-08-25 MED ORDER — CHLORDIAZEPOXIDE HCL 25 MG PO CAPS: 25.0000 mg | ORAL_CAPSULE | Freq: Three times a day (TID) | ORAL | Status: DC

## 2023-08-25 MED ORDER — POTASSIUM CHLORIDE 10 MEQ/100ML IV SOLN
10.0000 meq | INTRAVENOUS | Status: AC
  Administered 2023-08-25 (×2): 10 meq via INTRAVENOUS
  Filled 2023-08-25 (×2): qty 100

## 2023-08-25 MED ORDER — FOLIC ACID 1 MG PO TABS
1.0000 mg | ORAL_TABLET | Freq: Every day | ORAL | Status: DC
  Administered 2023-08-26: 1 mg via ORAL
  Filled 2023-08-25: qty 1

## 2023-08-25 MED ORDER — THIAMINE HCL 100 MG/ML IJ SOLN
100.0000 mg | Freq: Every day | INTRAMUSCULAR | Status: DC
  Administered 2023-08-25: 100 mg via INTRAVENOUS
  Filled 2023-08-25: qty 2

## 2023-08-25 MED ORDER — MAGNESIUM SULFATE 4 GM/100ML IV SOLN
4.0000 g | Freq: Once | INTRAVENOUS | Status: AC
  Administered 2023-08-25: 4 g via INTRAVENOUS
  Filled 2023-08-25: qty 100

## 2023-08-25 MED ORDER — POTASSIUM CHLORIDE 20 MEQ PO PACK: 40.0000 meq | PACK | Freq: Once | ORAL | Status: DC

## 2023-08-25 MED ORDER — THIAMINE HCL 100 MG/ML IJ SOLN
100.0000 mg | Freq: Every day | INTRAMUSCULAR | Status: DC
  Filled 2023-08-25: qty 2

## 2023-08-25 MED ORDER — ONDANSETRON 4 MG PO TBDP: 4.0000 mg | ORAL_TABLET | Freq: Four times a day (QID) | ORAL | Status: DC | PRN

## 2023-08-25 MED ORDER — ENOXAPARIN SODIUM 40 MG/0.4ML IJ SOSY
40.0000 mg | PREFILLED_SYRINGE | INTRAMUSCULAR | Status: DC
  Filled 2023-08-25: qty 0.4

## 2023-08-25 MED ORDER — CHLORDIAZEPOXIDE HCL 25 MG PO CAPS: 25.0000 mg | ORAL_CAPSULE | ORAL | Status: DC

## 2023-08-25 MED ORDER — FOLIC ACID 1 MG PO TABS: 1.0000 mg | ORAL_TABLET | Freq: Every day | ORAL | Status: DC

## 2023-08-25 MED ORDER — LEVETIRACETAM (KEPPRA) 500 MG/5 ML ADULT IV PUSH
500.0000 mg | Freq: Two times a day (BID) | INTRAVENOUS | Status: DC
  Administered 2023-08-26 (×2): 500 mg via INTRAVENOUS
  Filled 2023-08-25 (×3): qty 5

## 2023-08-25 MED ORDER — HYDROXYZINE HCL 25 MG PO TABS: 25.0000 mg | ORAL_TABLET | Freq: Four times a day (QID) | ORAL | Status: DC | PRN

## 2023-08-25 MED ORDER — LORAZEPAM 2 MG/ML IJ SOLN
1.0000 mg | INTRAMUSCULAR | Status: DC | PRN
  Administered 2023-08-25: 2 mg via INTRAVENOUS
  Filled 2023-08-25: qty 1

## 2023-08-25 MED ORDER — MAGNESIUM SULFATE 2 GM/50ML IV SOLN
2.0000 g | Freq: Once | INTRAVENOUS | Status: AC
  Administered 2023-08-25: 2 g via INTRAVENOUS
  Filled 2023-08-25: qty 50

## 2023-08-25 MED ORDER — LOPERAMIDE HCL 2 MG PO CAPS: 2.0000 mg | ORAL_CAPSULE | ORAL | Status: DC | PRN

## 2023-08-25 MED ORDER — THIAMINE MONONITRATE 100 MG PO TABS
100.0000 mg | ORAL_TABLET | Freq: Every day | ORAL | Status: DC
  Administered 2023-08-26: 100 mg via ORAL
  Filled 2023-08-25: qty 1

## 2023-08-25 MED ORDER — LACTATED RINGERS IV SOLN: INTRAVENOUS | Status: AC

## 2023-08-25 MED ORDER — ACETAMINOPHEN 650 MG RE SUPP
650.0000 mg | Freq: Four times a day (QID) | RECTAL | Status: DC | PRN
Start: 2023-08-25 — End: 2023-08-26

## 2023-08-25 MED ORDER — POTASSIUM PHOSPHATES 15 MMOLE/5ML IV SOLN
40.0000 mmol | Freq: Once | INTRAVENOUS | Status: AC
  Administered 2023-08-26: 40 mmol via INTRAVENOUS
  Filled 2023-08-25 (×3): qty 13.33

## 2023-08-25 MED ORDER — LEVETIRACETAM (KEPPRA) 500 MG/5 ML ADULT IV PUSH
1000.0000 mg | Freq: Once | INTRAVENOUS | Status: AC
  Administered 2023-08-25: 1000 mg via INTRAVENOUS
  Filled 2023-08-25: qty 10

## 2023-08-25 MED ORDER — THIAMINE MONONITRATE 100 MG PO TABS: 100.0000 mg | ORAL_TABLET | Freq: Every day | ORAL | Status: DC

## 2023-08-25 MED ORDER — CHLORDIAZEPOXIDE HCL 25 MG PO CAPS: 25.0000 mg | ORAL_CAPSULE | Freq: Every day | ORAL | Status: DC

## 2023-08-25 MED ORDER — ADULT MULTIVITAMIN W/MINERALS CH
1.0000 | ORAL_TABLET | Freq: Every day | ORAL | Status: DC
  Administered 2023-08-26: 1 via ORAL
  Filled 2023-08-25: qty 1

## 2023-08-25 MED ORDER — LORAZEPAM 1 MG PO TABS: 1.0000 mg | ORAL_TABLET | ORAL | Status: DC | PRN

## 2023-08-25 NOTE — ED Notes (Signed)
 CCMD contacted

## 2023-08-25 NOTE — ED Notes (Signed)
 Offered food and fluids to pt she refused.

## 2023-08-25 NOTE — ED Notes (Signed)
 MPA removed Pt continuous trying to get out of bed, bed alarm placed, peri care done brief and clean gown placed.

## 2023-08-25 NOTE — ED Notes (Signed)
 Pericare provided

## 2023-08-25 NOTE — Progress Notes (Signed)
 Tried swallow evaluation twice but patient does not want to be bothered

## 2023-08-25 NOTE — ED Provider Notes (Signed)
 [place] EMERGENCY DEPARTMENT AT [facility] Provider Note   CSN: [id number] Arrival date & time: 08/25/23  9279     Patient presents with: No chief complaint on file.   Christina Byrd is a 58 y.o. female.   Pt from home via EMS w reported seizure x 2 this AM. Report of recent etoh use.  EMS indicates had sz lasting ~ 1.5 min, gave versed. Pt poor/limited historian - level 5 caveat. No report of trauma/fall. No report of fever. Unclear if taking seizure meds. No report of incontinence or oral trauma. Hx seizures noted in chart, hx non compliance w meds. Hx SUD/etoh use disorder noted.   The history is provided by the patient, medical records and the EMS personnel. The history is limited by the condition of the patient.       Prior to Admission medications   Medication Sig Start Date End Date Taking? Authorizing Provider  acetaminophen  (TYLENOL ) 325 MG tablet Take 2 tablets (650 mg total) by mouth every 6 (six) hours as needed for mild pain (pain score 1-3) or fever (or Fever >/= 101). 07/27/23   Kandis Perkins, DO  allopurinol  (ZYLOPRIM ) 100 MG tablet Take 2 tablets (200 mg total) by mouth daily. 04/29/23   Harrie Bruckner, DO  atorvastatin  (LIPITOR) 20 MG tablet Take 1 tablet (20 mg total) by mouth daily. 05/03/23   Harrie Bruckner, DO  cetirizine  (ZYRTEC  ALLERGY) 10 MG tablet Take 1 tablet (10 mg total) by mouth daily. 05/26/23 05/25/24  Harrie Bruckner, DO  Cholecalciferol  1.25 MG (50000 UT) capsule Take 1 capsule (50,000 Units total) by mouth once a week for 8 doses. 08/17/23 10/06/23  Norrine Sharper, MD  DULoxetine  (CYMBALTA ) 30 MG capsule Take 1 capsule (30 mg total) by mouth daily. 05/26/23 05/25/24  Harrie Bruckner, DO  feeding supplement (ENSURE ENLIVE / ENSURE PLUS) LIQD Take 237 mLs by mouth 3 (three) times daily between meals. Patient taking differently: Take 237 mLs by mouth daily as needed (for meal replacement). 12/30/22   Fernand Prost, MD  folic acid   (FOLVITE ) 1 MG tablet Take 1 tablet (1 mg total) by mouth daily. 05/03/23   Harrie Bruckner, DO  gabapentin  (NEURONTIN ) 100 MG capsule Take 1 capsule (100 mg total) by mouth 3 (three) times daily. 06/06/23 06/05/24  Kandis Perkins, DO  levETIRAcetam  (KEPPRA ) 750 MG tablet TAKE 1 TABLET(750 MG) BY MOUTH TWICE DAILY 08/11/23   Harrie Bruckner, DO  levothyroxine  (SYNTHROID ) 25 MCG tablet Take 1 tablet (25 mcg total) by mouth daily before breakfast. 08/16/23   Norrine Sharper, MD  magnesium  oxide (MAG-OX) 400 (240 Mg) MG tablet Take 1 tablet (400 mg total) by mouth daily. 07/27/23   Kandis Perkins, DO  Multiple Vitamin (MULTIVITAMIN WITH MINERALS) TABS tablet Take 1 tablet by mouth daily. 05/03/23   Harrie Bruckner, DO  naltrexone  (DEPADE) 50 MG tablet Take 1 tablet (50 mg total) by mouth daily. 07/27/23   Kandis Perkins, DO  pantoprazole  (PROTONIX ) 40 MG tablet Take 1 tablet (40 mg total) by mouth 2 (two) times daily. 05/03/23   Harrie Bruckner, DO  potassium chloride  (KLOR-CON ) 20 MEQ packet Take 20 mEq by mouth daily. 08/17/23   Norrine Sharper, MD  thiamine  (VITAMIN B1) 100 MG tablet Take 1 tablet (100 mg total) by mouth daily. 07/27/23   Kandis Perkins, DO  metFORMIN  (GLUCOPHAGE ) 500 MG tablet Take 0.5 tablets (250 mg total) by mouth daily with breakfast. 03/05/20 04/02/20  Danton Jon HERO, PA-C    Allergies: Losartan   potassium, Levaquin  [levofloxacin  in d5w], Ace inhibitors, Codeine , Cefepime , Chlorhexidine, and Vancomycin     Review of Systems  Unable to perform ROS: Mental status change    Updated Vital Signs BP 114/88   Pulse 92   Temp (!) 97.3 F (36.3 C) (Tympanic)   Resp 16   Ht 1.575 m (5' 2)   Wt 45.8 kg   LMP 08/23/2011   SpO2 98%   BMI 18.47 kg/m   Physical Exam Vitals and nursing note reviewed.  Constitutional:      Appearance: Normal appearance. She is well-developed.  HENT:     Head: Atraumatic.     Comments: No face, head or scalp sts, bruising or contusion     Nose: Nose normal.     Mouth/Throat:     Mouth: Mucous membranes are moist.  Eyes:     General: No scleral icterus.    Conjunctiva/sclera: Conjunctivae normal.     Pupils: Pupils are equal, round, and reactive to light.  Neck:     Trachea: No tracheal deviation.     Comments: Trachea midline. Thyroid not grossly enlarged or tender. No neck stiffness or rigidity Cardiovascular:     Rate and Rhythm: Regular rhythm. Tachycardia present.     Pulses: Normal pulses.     Heart sounds: Normal heart sounds. No murmur heard.    No friction rub. No gallop.     Comments: Hr currently 102, sinus on monitor.  Pulmonary:     Effort: Pulmonary effort is normal. No respiratory distress.     Breath sounds: Normal breath sounds.  Abdominal:     General: Bowel sounds are normal. There is no distension.     Palpations: Abdomen is soft.     Tenderness: There is no abdominal tenderness. There is no guarding.  Genitourinary:    Comments: No cva tenderness.  Musculoskeletal:        General: No swelling or tenderness.     Cervical back: Normal range of motion and neck supple. No rigidity. No muscular tenderness.     Comments: CTLS spine, non tender, aligned, no step off. Good rom bil extremities without pain or focal bony tenderness.   Skin:    General: Skin is warm and dry.     Findings: No rash.  Neurological:     Mental Status: She is alert.     Comments: Alert, speech normal. Motor/sens grossly intact bil. Stre 5/5.      (all labs ordered are listed, but only abnormal results are displayed) Results for orders placed or performed during the hospital encounter of 08/25/23  Ethanol   Collection Time: 08/25/23  8:31 AM  Result Value Ref Range   Alcohol, Ethyl (B) <15 <15 mg/dL  CBC   Collection Time: 08/25/23  8:50 AM  Result Value Ref Range   WBC 5.2 4.0 - 10.5 K/uL   RBC 3.05 (L) 3.87 - 5.11 MIL/uL   Hemoglobin 9.4 (L) 12.0 - 15.0 g/dL   HCT 73.0 (L) 63.9 - 53.9 %   MCV 88.2 80.0 - 100.0  fL   MCH 30.8 26.0 - 34.0 pg   MCHC 34.9 30.0 - 36.0 g/dL   RDW 84.7 88.4 - 84.4 %   Platelets 82 (L) 150 - 400 K/uL   nRBC 0.6 (H) 0.0 - 0.2 %  Comprehensive metabolic panel with GFR   Collection Time: 08/25/23  9:25 AM  Result Value Ref Range   Sodium 136 135 - 145 mmol/L   Potassium 3.1 (L)  3.5 - 5.1 mmol/L   Chloride 100 98 - 111 mmol/L   CO2 23 22 - 32 mmol/L   Glucose, Bld 130 (H) 70 - 99 mg/dL   BUN 5 (L) 6 - 20 mg/dL   Creatinine, Ser 8.86 (H) 0.44 - 1.00 mg/dL   Calcium  7.7 (L) 8.9 - 10.3 mg/dL   Total Protein 5.9 (L) 6.5 - 8.1 g/dL   Albumin 2.5 (L) 3.5 - 5.0 g/dL   AST 65 (H) 15 - 41 U/L   ALT 18 0 - 44 U/L   Alkaline Phosphatase 101 38 - 126 U/L   Total Bilirubin 0.7 0.0 - 1.2 mg/dL   GFR, Estimated 56 (L) >60 mL/min   Anion gap 13 5 - 15  Magnesium    Collection Time: 08/25/23  9:25 AM  Result Value Ref Range   Magnesium  0.9 (LL) 1.7 - 2.4 mg/dL   CT Head Wo Contrast Result Date: 08/25/2023 CLINICAL DATA:  58 year old female with altered mental status. Seizures. EXAM: CT HEAD WITHOUT CONTRAST TECHNIQUE: Contiguous axial images were obtained from the base of the skull through the vertex without intravenous contrast. RADIATION DOSE REDUCTION: This exam was performed according to the departmental dose-optimization program which includes automated exposure control, adjustment of the mA and/or kV according to patient size and/or use of iterative reconstruction technique. COMPARISON:  Brain MRI 07/05/2023.  Head CT 07/20/2023. FINDINGS: The patient was imaged while decubitus. Brain: Chronic encephalomalacia in the left inferior frontal gyrus redemonstrated. Stable cerebral volume. Chronic encephalomalacia in the left lateral occipital lobe, junction with the inferior left parietal lobe redemonstrated and stable. No midline shift, ventriculomegaly, mass effect, evidence of mass lesion, intracranial hemorrhage or evidence of cortically based acute infarction. Vascular: No suspicious  intracranial vascular hyperdensity. Skull: Visible skull appears stable and intact. Sinuses/Orbits: Visualized paranasal sinuses and mastoids are stable and well aerated. Other: No acute scalp soft tissue finding identified. IMPRESSION: 1. Imaged while decubitus. No acute intracranial abnormality identified. 2. Stable chronic left hemisphere encephalomalacia. Electronically Signed   By: VEAR Hurst M.D.   On: 08/25/2023 10:53     EKG: EKG Interpretation Date/Time:  Thursday August 25 2023 07:29:31 EDT Ventricular Rate:  137 PR Interval:  103 QRS Duration:  75 QT Interval:  399 QTC Calculation: 603 R Axis:   70  Text Interpretation: Sinus tachycardia Non-specific ST-t changes Confirmed by Bernard Drivers ([id number]) on 08/25/2023 8:08:15 AM  Radiology: CT Head Wo Contrast Result Date: 08/25/2023 CLINICAL DATA:  58 year old female with altered mental status. Seizures. EXAM: CT HEAD WITHOUT CONTRAST TECHNIQUE: Contiguous axial images were obtained from the base of the skull through the vertex without intravenous contrast. RADIATION DOSE REDUCTION: This exam was performed according to the departmental dose-optimization program which includes automated exposure control, adjustment of the mA and/or kV according to patient size and/or use of iterative reconstruction technique. COMPARISON:  Brain MRI 07/05/2023.  Head CT 07/20/2023. FINDINGS: The patient was imaged while decubitus. Brain: Chronic encephalomalacia in the left inferior frontal gyrus redemonstrated. Stable cerebral volume. Chronic encephalomalacia in the left lateral occipital lobe, junction with the inferior left parietal lobe redemonstrated and stable. No midline shift, ventriculomegaly, mass effect, evidence of mass lesion, intracranial hemorrhage or evidence of cortically based acute infarction. Vascular: No suspicious intracranial vascular hyperdensity. Skull: Visible skull appears stable and intact. Sinuses/Orbits: Visualized paranasal sinuses and  mastoids are stable and well aerated. Other: No acute scalp soft tissue finding identified. IMPRESSION: 1. Imaged while decubitus. No acute intracranial abnormality identified. 2. Stable  chronic left hemisphere encephalomalacia. Electronically Signed   By: VEAR Hurst M.D.   On: 08/25/2023 10:53     Procedures   Medications Ordered in the ED  magnesium  sulfate IVPB 2 g 50 mL (has no administration in time range)  potassium chloride  10 mEq in 100 mL IVPB (has no administration in time range)  potassium chloride  (KLOR-CON ) packet 40 mEq (has no administration in time range)  lactated ringers  bolus 1,000 mL (0 mLs Intravenous Stopped 08/25/23 1019)  levETIRAcetam  (KEPPRA ) undiluted injection 1,000 mg (1,000 mg Intravenous Given 08/25/23 0838)                                    Medical Decision Making Problems Addressed: Chronic anemia: chronic illness or injury History of substance use disorder: chronic illness or injury with exacerbation, progression, or side effects of treatment that poses a threat to life or bodily functions Hypoalbuminemia: chronic illness or injury Hypokalemia: acute illness or injury Hypomagnesemia: acute illness or injury with systemic symptoms that poses a threat to life or bodily functions Seizure [facility]): acute illness or injury with systemic symptoms that poses a threat to life or bodily functions Seizure disorder [facility]): chronic illness or injury with exacerbation, progression, or side effects of treatment that poses a threat to life or bodily functions Thrombocytopenia (HCC): acute illness or injury  Amount and/or Complexity of Data Reviewed Independent Historian: EMS    Details: hx External Data Reviewed: notes. Labs: ordered. Decision-making details documented in ED Course. Radiology: ordered and independent interpretation performed. Decision-making details documented in ED Course. ECG/medicine tests: ordered and independent interpretation performed.  Decision-making details documented in ED Course. Discussion of management or test interpretation with external provider(s): medicine  Risk Prescription drug management. Decision regarding hospitalization.   Iv ns. Continuous pulse ox and cardiac monitoring. Labs ordered/sent. Imaging ordered.   Differential diagnosis includes seizure, sud/etoh use, etc. Dispo decision including potential need for admission considered - will get labs and imaging and reassess.   Reviewed nursing notes and prior charts for additional history. External reports reviewed. Additional history from: EMS.   Cardiac monitor: sinus rhythm, rate 102, keppra  iv.   Labs reviewed/interpreted by me - wbc 5, hct 27 c/w prior, plt low. K low, kcl iv and po. Mg low, mg iv.   CT reviewed/interpreted by me - no hem.   Given recurrent seizures, prolonged postictal period, weakness, altered ms, low mg/k, etc, will consult medicine for admission.  CRITICAL CARE RE: recurrent seizures in setting SUD, severe hypomagnesemia, hypokalemia. Performed by: Khadeejah Castner E Geovonni Meyerhoff Total critical care time: 45 minutes Critical care time was exclusive of separately billable procedures and treating other patients. Critical care was necessary to treat or prevent imminent or life-threatening deterioration. Critical care was time spent personally by me on the following activities: development of treatment plan with patient and/or surrogate as well as nursing, discussions with consultants, evaluation of patient's response to treatment, examination of patient, obtaining history from patient or surrogate, ordering and performing treatments and interventions, ordering and review of laboratory studies, ordering and review of radiographic studies, pulse oximetry and re-evaluation of patient's condition.       Final diagnoses:  None    ED Discharge Orders     None          Bernard Drivers, MD 08/25/23 1245

## 2023-08-25 NOTE — H&P (Signed)
 Date: 08/25/2023               Patient Name:  Christina Byrd MRN: [id number]  DOB: 03-18-1965 Age / Sex: 58 y.o., female   PCP: Harrie Bruckner, DO         Medical Service: Internal Medicine Teaching Service         Attending Physician: Dr. MICAEL Riis Winfrey      First Contact: Christina Novak, DO    Second Contact: Dr. Hadassah Kristy Ahr, MD          Pager Information: First Contact Pager: [phone]   Second Contact Pager: [phone]   SUBJECTIVE   Chief Complaint: Seizures at home.   History of Present Illness: Christina Byrd is a 58 y.o. female with PMH of alcohol use disorder, substance use disorder, medical noncompliance, CKD 3b, alcoholic peripheral neuropathy, osteoarthritis, and hyperuricemia. The patient is unable to give a history at this time, so it was obtained by her significant other. This morning (7/3) the patient's husband was awoken in bed by shaking. He states that he looked over and her eyes were rolling back in her head with her wrists brought to her chest and she would not respond to him. This happened one more time before he called 911. After the EMT's arrived, one more seizure was witnessed and at this time the patient was given versed by the EMT's. The husband does describe a post-ictal state in which the patient was non responsive. According to him, this happens frequently and she refuses to eat and will only drink liquor and beer until an episode like this occurs and then she is back in the hospital. He does admit that she often hallucinates while at home. Today, the patient is difficult to arouse and will only answer in one word sentences. She does not have any recollection of today's events.    ED Course: Labs significant for: K+ 3.1,  Ca 7.7,  Mg. 0.9,   BUN 5,  Cr 1.13, Albumin 2.5 Imaging CXR No acute findings, CT head: No acute intracranial abnormality Received Keppra  1000mg , thiamine  100mg , LR bolus, Mag sulfate 2g, Kcl 20mEq Consulted  Internal medicine  Past Medical History CKD 3b Alcoholic peripheral neuropathy Osteoarthritis Hyperuricemia  Past Surgical History Past Surgical History:  Procedure Laterality Date   BIOPSY  06/03/2022   Procedure: BIOPSY;  Surgeon: Stacia Glendia BRAVO, MD;  Location: [facility] ENDOSCOPY;  Service: Gastroenterology;;   COLONOSCOPY N/A 06/21/2023   Procedure: COLONOSCOPY;  Surgeon: Albertus Gordy HERO, MD;  Location: [facility] ENDOSCOPY;  Service: Gastroenterology;  Laterality: N/A;   ESOPHAGOGASTRODUODENOSCOPY N/A 12/29/2022   Procedure: ESOPHAGOGASTRODUODENOSCOPY (EGD);  Surgeon: Nandigam, Kavitha V, MD;  Location: [facility] ENDOSCOPY;  Service: Gastroenterology;  Laterality: N/A;   ESOPHAGOGASTRODUODENOSCOPY N/A 06/21/2023   Procedure: EGD (ESOPHAGOGASTRODUODENOSCOPY);  Surgeon: Albertus Gordy HERO, MD;  Location: [facility] ENDOSCOPY;  Service: Gastroenterology;  Laterality: N/A;   ESOPHAGOGASTRODUODENOSCOPY (EGD) WITH PROPOFOL  N/A 06/03/2022   Procedure: ESOPHAGOGASTRODUODENOSCOPY (EGD) WITH PROPOFOL ;  Surgeon: Stacia Glendia BRAVO, MD;  Location: [facility] ENDOSCOPY;  Service: Gastroenterology;  Laterality: N/A;   HOT HEMOSTASIS N/A 06/03/2022   Procedure: HOT HEMOSTASIS (ARGON PLASMA COAGULATION/BICAP);  Surgeon: Stacia Glendia BRAVO, MD;  Location: [facility] ENDOSCOPY;  Service: Gastroenterology;  Laterality: N/A;    Meds:  No outpatient medications have been marked as taking for the 08/25/23 encounter [facility] Encounter).    Social:  Lives With: Occupation: Support: Level of Function: PCP:  Harrie Bruckner, DO  Substances: -Tobacco: no -Alcohol: Liquor and beer  -  Recreational Drug: Cocaine  Family History:  Family History  Problem Relation Age of Onset   CAD Mother      Allergies: Allergies as of 08/25/2023 - Review Complete 08/25/2023  Allergen Reaction Noted   Losartan  potassium  12/20/2012   Levaquin  [levofloxacin  in d5w] Rash 02/06/2013   Ace inhibitors Cough 01/30/2008   Codeine  Nausea Only 05/26/2015    Cefepime  Rash 09/05/2012   Chlorhexidine Itching and Rash 07/09/2022   Vancomycin  Rash 09/05/2012    Review of Systems: A complete ROS was negative except as per HPI.   OBJECTIVE:   Physical Exam: Blood pressure (!) 141/79, pulse 93, temperature 97.6 F (36.4 C), temperature source Tympanic, resp. rate (!) 22, height 5' 2 (1.575 m), weight 45.8 kg, last menstrual period 08/23/2011, SpO2 100%.  Constitutional: Obtunded in left lateral decubitus position HENT: normocephalic atraumatic, Eyes: conjunctiva non-erythematous, PERRL Neck: supple Cardiovascular: RRR no pitting edema bilaterally  Pulmonary/Chest: normal work of breathing on room air, lungs clear to auscultation bilaterally MSK: normal bulk and tone Skin: warm and dry Psych: obtunded state, A&O x1 to self. Easily agitated  Labs: CBC    Component Value Date/Time   WBC 5.2 08/25/2023 0850   RBC 3.05 (L) 08/25/2023 0850   HGB 9.4 (L) 08/25/2023 0850   HGB 10.2 (L) 08/15/2023 1637   HCT 26.9 (L) 08/25/2023 0850   HCT 32.2 (L) 08/15/2023 1637   PLT 82 (L) 08/25/2023 0850   PLT 244 08/15/2023 1637   MCV 88.2 08/25/2023 0850   MCV 98 (H) 08/15/2023 1637   MCH 30.8 08/25/2023 0850   MCHC 34.9 08/25/2023 0850   RDW 15.2 08/25/2023 0850   RDW 15.4 08/15/2023 1637   LYMPHSABS 1.5 06/22/2023 0639   LYMPHSABS 1.5 04/27/2022 1125   MONOABS 1.0 06/22/2023 0639   EOSABS 0.1 06/22/2023 0639   EOSABS 0.0 04/27/2022 1125   BASOSABS 0.0 06/22/2023 0639   BASOSABS 0.0 04/27/2022 1125     CMP     Component Value Date/Time   NA 136 08/25/2023 0925   NA 142 08/15/2023 1637   K 3.1 (L) 08/25/2023 0925   CL 100 08/25/2023 0925   CO2 23 08/25/2023 0925   GLUCOSE 130 (H) 08/25/2023 0925   BUN 5 (L) 08/25/2023 0925   BUN 8 08/15/2023 1637   CREATININE 1.13 (H) 08/25/2023 0925   CREATININE 1.00 03/21/2014 1556   CALCIUM  7.7 (L) 08/25/2023 0925   PROT 5.9 (L) 08/25/2023 0925   PROT 5.6 (L) 07/14/2023 1458   ALBUMIN 2.5 (L)  08/25/2023 0925   ALBUMIN 2.7 (L) 07/14/2023 1458   AST 65 (H) 08/25/2023 0925   ALT 18 08/25/2023 0925   ALKPHOS 101 08/25/2023 0925   BILITOT 0.7 08/25/2023 0925   BILITOT 0.3 07/14/2023 1458   GFRNONAA 56 (L) 08/25/2023 0925   GFRNONAA >89 12/11/2013 1214   GFRAA 61 01/02/2020 1100   GFRAA >89 12/11/2013 1214    Imaging:  CT Head Wo Contrast Result Date: 08/25/2023 CLINICAL DATA:  58 year old female with altered mental status. Seizures. EXAM: CT HEAD WITHOUT CONTRAST TECHNIQUE: Contiguous axial images were obtained from the base of the skull through the vertex without intravenous contrast. RADIATION DOSE REDUCTION: This exam was performed according to the departmental dose-optimization program which includes automated exposure control, adjustment of the mA and/or kV according to patient size and/or use of iterative reconstruction technique. COMPARISON:  Brain MRI 07/05/2023.  Head CT 07/20/2023. FINDINGS: The patient was imaged while decubitus. Brain: Chronic encephalomalacia in the  left inferior frontal gyrus redemonstrated. Stable cerebral volume. Chronic encephalomalacia in the left lateral occipital lobe, junction with the inferior left parietal lobe redemonstrated and stable. No midline shift, ventriculomegaly, mass effect, evidence of mass lesion, intracranial hemorrhage or evidence of cortically based acute infarction. Vascular: No suspicious intracranial vascular hyperdensity. Skull: Visible skull appears stable and intact. Sinuses/Orbits: Visualized paranasal sinuses and mastoids are stable and well aerated. Other: No acute scalp soft tissue finding identified. IMPRESSION: 1. Imaged while decubitus. No acute intracranial abnormality identified. 2. Stable chronic left hemisphere encephalomalacia. Electronically Signed   By: VEAR Hurst M.D.   On: 08/25/2023 10:53     EKG: personally reviewed my interpretation is Sinus tacchycardia with prolonged qtc at 603. Prior EKG qtc 500  ASSESSMENT &  PLAN:   Assessment & Plan by Problem: Principal Problem:   Seizures (HCC)   ALAUNA HAYDEN is a 58 y.o. person living with a history of alcohol use disorder, substance use disorder including cocaine, CKD 3b, alcoholic peripheral neuropathy, and medical noncompliance  who presented after multiple witnessed seizures at home. and admitted for alcohol withdrawl on hospital day 0.  Alcohol use disorder Alcohol withdrawal Concern for withdrawal seizures This patient presented this after 3 witnessed seizures at home. She does have a pattern of this with multiple hospitalizations in the past. No concern for other etiologies causing seizures at this time. She does have a history of DT as well. The pt was loaded with Keppra  in the ED. Continue Keppra  with continuous telemetry monitory. Will begin CIWA protocol with librium  taper, ativan  PRN, and seizure precautions. Due to obtunded state on exam, will remain NPO and consult speech therapy.   Severe malnutrition Electrolyte disturbances Admitted with K+ 3.1, Mag 0.9, Ca 7.7. Likely due to severe malnutrition, alcohol abuse, and dehydration. Will replete and continue monitor electrolytes.   Prolonged QTc Qtc 603 on EKG in ED. EKG in May showed Qtc on 500. Likely due to hypomagnesemia. Will replete magnesium  and recheck EKG.   Chronic Anemia  Best practice: Diet: NPO VTE: Enoxaparin  IVF: LR,100cc/hr Code: Full  Disposition planning: Prior to Admission Living Arrangement: Home, living with significant other Anticipated Discharge Location: Home Barriers to Discharge: withdrawal precautions.   Dispo: Admit patient to Observation with expected length of stay less than 2 midnights.  Signed: Myrna Bitters, DO Internal Medicine Resident  08/25/2023, 3:18 PM  Please contact IM Residency On-Call Pager at: [phone] or [phone].

## 2023-08-25 NOTE — ED Notes (Signed)
 Patient transported to CT

## 2023-08-25 NOTE — ED Notes (Signed)
 Pt placed on bedpan 1 urine occurrence and 1 stool occurrence. Unable to collect urine sample at this time.

## 2023-08-25 NOTE — ED Triage Notes (Signed)
 BIBEMS GC from home seizures X 2 this morning. Heavy ETOH drinking. Pt post ictal when EMS arrived. Pt had tolnnic clonic seizure with EMS x 1.5 mins  5 mg versed admin,  MPA placed R nare

## 2023-08-25 NOTE — ED Notes (Signed)
 Attempted I/O cath multiple times unsuccessful. Dr. Bernard made aware.

## 2023-08-25 NOTE — Progress Notes (Signed)
 1725: Patient received from ED, alert and oriented X2, vital signs stable. Tele box connected, bed alarm on, call bell within reach, will continue to monitor.  1800: Pt is very irritable, asked for swallow evaluation, but does not like to be bothered, refused PO medication and IV keppra , refused lab work, MD aware, RL infusing @100ml /hr, will continue to monitor

## 2023-08-25 NOTE — ED Notes (Addendum)
 Pt significant other Reta Ada) updated on pt status and admission.

## 2023-08-25 NOTE — ED Notes (Signed)
Pt refusing bladder scan.

## 2023-08-25 NOTE — ED Notes (Signed)
 Placed on  bedpan

## 2023-08-26 DIAGNOSIS — D696 Thrombocytopenia, unspecified: Secondary | ICD-10-CM

## 2023-08-26 DIAGNOSIS — E876 Hypokalemia: Secondary | ICD-10-CM

## 2023-08-26 DIAGNOSIS — D649 Anemia, unspecified: Secondary | ICD-10-CM

## 2023-08-26 DIAGNOSIS — Z87898 Personal history of other specified conditions: Secondary | ICD-10-CM

## 2023-08-26 DIAGNOSIS — G621 Alcoholic polyneuropathy: Secondary | ICD-10-CM

## 2023-08-26 LAB — COMPREHENSIVE METABOLIC PANEL WITH GFR
ALT: 15 U/L (ref 0–44)
AST: 41 U/L (ref 15–41)
Albumin: 2.1 g/dL — ABNORMAL LOW (ref 3.5–5.0)
Alkaline Phosphatase: 87 U/L (ref 38–126)
Anion gap: 9 (ref 5–15)
BUN: <5 mg/dL — ABNORMAL LOW (ref 6–20)
CO2: 23 mmol/L (ref 22–32)
Calcium: 7.5 mg/dL — ABNORMAL LOW (ref 8.9–10.3)
Chloride: 108 mmol/L (ref 98–111)
Creatinine, Ser: 0.59 mg/dL (ref 0.44–1.00)
GFR, Estimated: >60 mL/min (ref >60)
Glucose, Bld: 90 mg/dL (ref 70–99)
Potassium: 3.7 mmol/L (ref 3.5–5.1)
Sodium: 140 mmol/L (ref 135–145)
Total Bilirubin: 0.7 mg/dL (ref 0.0–1.2)
Total Protein: 5.1 g/dL — ABNORMAL LOW (ref 6.5–8.1)

## 2023-08-26 LAB — CBC
HCT: 23.9 % — ABNORMAL LOW (ref 36.0–46.0)
Hemoglobin: 8.2 g/dL — ABNORMAL LOW (ref 12.0–15.0)
MCH: 31.2 pg (ref 26.0–34.0)
MCHC: 34.3 g/dL (ref 30.0–36.0)
MCV: 90.9 fL (ref 80.0–100.0)
Platelets: 83 K/uL — ABNORMAL LOW (ref 150–400)
RBC: 2.63 MIL/uL — ABNORMAL LOW (ref 3.87–5.11)
RDW: 15.5 % (ref 11.5–15.5)
WBC: 4.9 K/uL (ref 4.0–10.5)
nRBC: 0 % (ref 0.0–0.2)

## 2023-08-26 LAB — MAGNESIUM: Magnesium: 2.3 mg/dL (ref 1.7–2.4)

## 2023-08-26 LAB — PHOSPHORUS: Phosphorus: 3.2 mg/dL (ref 2.5–4.6)

## 2023-08-26 MED ORDER — GABAPENTIN 100 MG PO CAPS
100.0000 mg | ORAL_CAPSULE | Freq: Three times a day (TID) | ORAL | Status: DC
  Administered 2023-08-26: 100 mg via ORAL
  Filled 2023-08-26 (×2): qty 1

## 2023-08-26 MED ORDER — SODIUM CHLORIDE 0.9% FLUSH: 10.0000 mL | INTRAVENOUS | Status: DC | PRN

## 2023-08-26 MED ORDER — LEVOTHYROXINE SODIUM 25 MCG PO TABS
25.0000 ug | ORAL_TABLET | Freq: Every day | ORAL | Status: DC
  Administered 2023-08-26: 25 ug via ORAL
  Filled 2023-08-26: qty 1

## 2023-08-26 MED ORDER — ENSURE PLUS HIGH PROTEIN PO LIQD: 237.0000 mL | Freq: Three times a day (TID) | ORAL | Status: DC

## 2023-08-26 MED ORDER — DULOXETINE HCL 30 MG PO CPEP
30.0000 mg | ORAL_CAPSULE | Freq: Every day | ORAL | Status: DC
  Administered 2023-08-26: 30 mg via ORAL
  Filled 2023-08-26: qty 1

## 2023-08-26 MED ORDER — LEVETIRACETAM 750 MG PO TABS: 750.0000 mg | ORAL_TABLET | Freq: Two times a day (BID) | ORAL | Status: DC

## 2023-08-26 MED ORDER — PANTOPRAZOLE SODIUM 40 MG PO TBEC
40.0000 mg | DELAYED_RELEASE_TABLET | Freq: Two times a day (BID) | ORAL | Status: DC
  Administered 2023-08-26: 40 mg via ORAL
  Filled 2023-08-26: qty 1

## 2023-08-26 MED ORDER — POTASSIUM CHLORIDE 10 MEQ/100ML IV SOLN
10.0000 meq | INTRAVENOUS | Status: AC
  Administered 2023-08-26 (×2): 10 meq via INTRAVENOUS
  Filled 2023-08-26: qty 100

## 2023-08-26 NOTE — Progress Notes (Addendum)
 Initial Nutrition Assessment  DOCUMENTATION CODES:  Underweight  INTERVENTION:  Monitor for diet advancement - pt current refusing swallow evaluation so will remains NPO When cleared for a diet, would benefit from Ensure Plus High Protein po TID, each supplement provides 350 kcal and 20 grams of protein Would also recommend adding Magic cup TID with meals, each supplement provides 290 kcal and 9 grams of protein Continue MVI, thiamine , and folic acid  daily If pt refuses PO intake may need NGT and enteral feeds  NUTRITION DIAGNOSIS:  Inadequate oral intake related to poor appetite (in the context of alcohol abuse) as evidenced by  (underweight BMI and significant other's report).  GOAL:  Patient will meet greater than or equal to 90% of their needs  MONITOR:  Diet advancement, Labs, Weight trends  REASON FOR ASSESSMENT:  Consult Poor PO  ASSESSMENT:  Pt with hx of alcohol and drug abuse, CKD3, and osteoarthritis presented to ED with seizures related to alcohol withdrawal.  RD working remotely  Pt has been seen by RD team multiple times in the past at various admissions. Has been dx with malnutrition at each visit, most recently on 07/06/23. At that time, pt had recently had her teeth pulled but stated that she was eating normal textures.   Pt currently NPO and on CIWA protocol. Significant other reported at admission that pt eats hardly anything at home, only consumes alcohol until she has one of these episodes and will agree to eat some while hospitalized.   During last admission there was concern for Wernicke's encephalopathy with low serum thiamine  levels.   Discussed pt with RN. Pt refused to do yale swallow screen last night and continues to refuse this AM. RN reports that she will attempt again in a little while, also refusing to take PO medications.   In the past, pt has indicated that she likes ensure. Would recommend adding TID once diet is put into place. If pt continues  to refuse to cooperate with swallow evaluation, would likely benefit from NGT and enteral feeds.   Addendum: RN reports pt agreeable to medications and passed yale swallow. Due to poor dentition, entered DYS3 diet   Admit / Current weight: 45.8  ? Accuracy of weights in hx as they are quite variable over the last few months but does appear to be losing over the last year with ~16% weight loss noted since 09/02/2022. Pt is currently underweight.  Nutritionally Relevant Medications: Scheduled Meds:  folic acid   1 mg Oral Daily   multivitamin with minerals  1 tablet Oral Daily   pantoprazole   40 mg Oral BID   thiamine   100 mg Oral Daily   Continuous Infusions:  potassium PHOSPHATE  IVPB (in mmol) 40 mmol (08/26/23 0316)   PRN Meds: loperamide , ondansetron   Labs Reviewed: BUN <5 HgbA1c 3.7% (05/26/23)  NUTRITION - FOCUSED PHYSICAL EXAM: Defer to in-person assessment  Diet Order:   Diet Order             Diet NPO time specified Except for: Sips with Meds  Diet effective now                   EDUCATION NEEDS:  Not appropriate for education at this time  Skin:  Skin Assessment: Reviewed RN Assessment  Last BM:  7/4 - type 6  Height:  Ht Readings from Last 1 Encounters:  08/25/23 5' 2 (1.575 m)    Weight:  Wt Readings from Last 1 Encounters:  08/25/23 45.8 kg  Ideal Body Weight:  50 kg  BMI:  Body mass index is 18.47 kg/m.  Estimated Nutritional Needs:  Kcal:  1500-1700 kcal/d Protein:  70-90g/d Fluid:  1.5-1.7L/d    Vernell Lukes, RD, LDN, CNSC Registered Dietitian II Please reach out via secure chat

## 2023-08-26 NOTE — Evaluation (Signed)
 Occupational Therapy Evaluation Patient Details Name: Christina Byrd MRN: [id number] DOB: Nov 27, 1965 Today's Date: 08/26/2023   History of Present Illness   58 y.o. female presents to [facility] 08/25/23 with multiple witnessed seizures at home. Admitted for alcohol withdrawal. PMHx: CKD 3b, OA, alcohol abuse, alcoholic pancreatitis, seizure disorder, Wernicke's encephalopathy.     Clinical Impressions Pt admitted for above, PTA she reports being ind with her ADLs and mod I in home using RW but reports challenges with getting to/from the bathroom. Pt currently presenting with generalized weakness and impaired activity tolerance, she has little aptitude for engagement with therapy today but open to sitting EOB for assessment with encouragement. Pt navigating bed mobility with CGA. OT to continue following pt acutely to progress pt as able and help transition to next level of care. Patient would benefit from post acute Home OT services to help maximize functional independence in natural environment       If plan is discharge home, recommend the following:   A little help with bathing/dressing/bathroom;Assistance with cooking/housework;Direct supervision/assist for medications management     Functional Status Assessment   Patient has had a recent decline in their functional status and demonstrates the ability to make significant improvements in function in a reasonable and predictable amount of time.     Equipment Recommendations   Other (comment) (to be further assessed. none so far)     Recommendations for Other Services         Precautions/Restrictions   Precautions Precautions: Fall Restrictions Weight Bearing Restrictions Per Provider Order: No     Mobility Bed Mobility Overal bed mobility: Needs Assistance Bed Mobility: Supine to Sit, Sit to Supine     Supine to sit: Contact guard Sit to supine: Contact guard assist   General bed mobility comments: CGA for  safety    Transfers                   General transfer comment: pt declined      Balance Overall balance assessment: Needs assistance Sitting-balance support: No upper extremity supported, Feet supported Sitting balance-Leahy Scale: Good         Standing balance comment: declined.                           ADL either performed or assessed with clinical judgement   ADL                                         General ADL Comments: Pt seeming able to complete ADLs with CGA sitting EOB, she is reluctant to engage in therapy and needed significant encouragement to get to EOB for MMT. based on ROM she is ale to complete ADLs. They will be futher assessed pending more sessions.     Vision   Additional Comments: limited, to be further assessed.     Perception Perception: Not tested       Praxis Praxis: Not tested       Pertinent Vitals/Pain Pain Assessment Pain Assessment: No/denies pain     Extremity/Trunk Assessment Upper Extremity Assessment Upper Extremity Assessment: Generalized weakness   Lower Extremity Assessment Lower Extremity Assessment: Generalized weakness   Cervical / Trunk Assessment Cervical / Trunk Assessment: Normal   Communication Communication Communication: Impaired Factors Affecting Communication: Reduced clarity of speech   Cognition Arousal: Lethargic Behavior During Therapy:  Flat affect Cognition: Difficult to assess, Cognition impaired Difficult to assess due to: Level of arousal (Limited agreeableness for therapy) Orientation impairments: Situation (no recall of why she is in the hospital. Unable to recall events before/after seizure.)         OT - Cognition Comments: A&Ox4. Follows all simple commands, receptive to getting EOB with encouragement.                 Following commands: Intact       Cueing  General Comments   Cueing Techniques: Verbal cues      Exercises      Shoulder Instructions      Home Living Family/patient expects to be discharged to:: Private residence Living Arrangements: Spouse/significant other Available Help at Discharge: Family;Available PRN/intermittently Type of Home: House Home Access: Ramped entrance     Home Layout: One level     Bathroom Shower/Tub: Chief Strategy Officer: Standard Bathroom Accessibility: Yes   Home Equipment: Agricultural consultant (2 wheels);BSC/3in1;Shower seat          Prior Functioning/Environment Prior Level of Function : Independent/Modified Independent;History of Falls (last six months)             Mobility Comments: Ind with no AD, reports difficulty carrying objects at home ADLs Comments: pt reports being independent in self care and IADLs    OT Problem List: Decreased activity tolerance;Impaired balance (sitting and/or standing);Decreased strength   OT Treatment/Interventions: Self-care/ADL training;Patient/family education;Balance training;Therapeutic activities      OT Goals(Current goals can be found in the care plan section)   Acute Rehab OT Goals Patient Stated Goal: none stated OT Goal Formulation: With patient Time For Goal Achievement: 09/09/23 Potential to Achieve Goals: Good ADL Goals Pt Will Perform Grooming: with supervision;standing Pt Will Perform Lower Body Dressing: with supervision;sit to/from stand Pt Will Transfer to Toilet: with supervision;ambulating Additional ADL Goal #1: Pt will demonstrate independent use of energy conservation strategies to complete ADLs.   OT Frequency:  Min 2X/week    Co-evaluation   Reason for Co-Treatment: Other (comment) (limited agreeableness) PT goals addressed during session: Mobility/safety with mobility;Balance OT goals addressed during session: Strengthening/ROM      AM-PAC OT 6 Clicks Daily Activity     Outcome Measure Help from another person eating meals?: A Little Help from another person taking  care of personal grooming?: A Little Help from another person toileting, which includes using toliet, bedpan, or urinal?: A Little Help from another person bathing (including washing, rinsing, drying)?: A Little Help from another person to put on and taking off regular upper body clothing?: A Little Help from another person to put on and taking off regular lower body clothing?: A Little 6 Click Score: 18   End of Session Nurse Communication: Mobility status  Activity Tolerance: Patient limited by fatigue;Patient limited by lethargy Patient left: in bed;with call bell/phone within reach;with bed alarm set  OT Visit Diagnosis: Muscle weakness (generalized) (M62.81)                Time: 9074-9062 OT Time Calculation (min): 12 min Charges:  OT General Charges $OT Visit: 1 Visit OT Evaluation $OT Eval Low Complexity: 1 Low  08/26/2023  AB, OTR/L  Acute Rehabilitation Services  Office: [phone]   Curtistine JONETTA Das 08/26/2023, 11:12 AM

## 2023-08-26 NOTE — Discharge Summary (Signed)
 Name: Christina Byrd MRN: [id number] DOB: April 23, 1965 58 y.o. PCP: Harrie Bruckner, DO  Date of Admission: 08/25/2023  7:20 AM Date of Discharge: 08/26/2023 Attending Physician: Dr. MICAEL Riis Winfrey  Discharge Diagnosis: 1. Principal Problem:   Seizures (HCC) Active Problems:   History of substance use disorder   Thrombocytopenia (HCC)   Chronic anemia    Discharge Medications: Allergies as of 08/26/2023       Reactions   Losartan  Potassium    Rash   Levaquin  [levofloxacin  In D5w] Rash   Ace Inhibitors Cough   REACTION: cough   Codeine  Nausea Only   Cefepime  Rash   09/04/12 pm Patient started to break out with small macules after IV Vanco infusion, then macules increased in size after starting cefepime  infusion.   Chlorhexidine Itching, Rash   Vancomycin  Rash   09/04/12 pm Patient started to break out with small macules after IV Vanco infusion, then macules increased in size after starting cefepime  infusion.        Medication List     TAKE these medications    acetaminophen  325 MG tablet Commonly known as: TYLENOL  Take 2 tablets (650 mg total) by mouth every 6 (six) hours as needed for mild pain (pain score 1-3) or fever (or Fever >/= 101).   allopurinol  100 MG tablet Commonly known as: ZYLOPRIM  Take 2 tablets (200 mg total) by mouth daily.   atorvastatin  20 MG tablet Commonly known as: LIPITOR Take 1 tablet (20 mg total) by mouth daily.   cetirizine  10 MG tablet Commonly known as: ZyrTEC  Allergy Take 1 tablet (10 mg total) by mouth daily.   Cholecalciferol  1.25 MG (50000 UT) capsule Take 1 capsule (50,000 Units total) by mouth once a week for 8 doses.   DULoxetine  30 MG capsule Commonly known as: Cymbalta  Take 1 capsule (30 mg total) by mouth daily.   feeding supplement Liqd Take 237 mLs by mouth 3 (three) times daily between meals. What changed:  when to take this reasons to take this   folic acid  1 MG tablet Commonly known as:  FOLVITE  Take 1 tablet (1 mg total) by mouth daily.   gabapentin  100 MG capsule Commonly known as: Neurontin  Take 1 capsule (100 mg total) by mouth 3 (three) times daily.   levETIRAcetam  750 MG tablet Commonly known as: KEPPRA  TAKE 1 TABLET(750 MG) BY MOUTH TWICE DAILY   levothyroxine  25 MCG tablet Commonly known as: SYNTHROID  Take 1 tablet (25 mcg total) by mouth daily before breakfast.   magnesium  oxide 400 (240 Mg) MG tablet Commonly known as: MAG-OX Take 1 tablet (400 mg total) by mouth daily.   multivitamin with minerals Tabs tablet Take 1 tablet by mouth daily.   naltrexone  50 MG tablet Commonly known as: DEPADE Take 1 tablet (50 mg total) by mouth daily.   pantoprazole  40 MG tablet Commonly known as: Protonix  Take 1 tablet (40 mg total) by mouth 2 (two) times daily.   potassium chloride  20 MEQ packet Commonly known as: KLOR-CON  Take 20 mEq by mouth daily.   thiamine  100 MG tablet Commonly known as: VITAMIN B1 Take 1 tablet (100 mg total) by mouth daily.        Disposition and follow-up:   Christina Byrd was discharged from [facility] in [place] condition.  At the hospital follow up visit please address:  Alcohol use disorder Alcohol withdrawal Concern for withdrawal seizures Patient reported she will continue drinking alcohol.  Assess for seizure activity and cessation  Severe malnutrition Electrolyte disturbances Assess Mg and K at follow up  Concern for cirrhosis Recommend liver elastogram  2.  Labs / imaging needed at time of follow-up: RFP, Magnesium   3.  Pending labs/ test needing follow-up: None  Follow-up Appointments:    Hospital Course by problem list: Christina Byrd is a 58 y.o. person living with a history of who presented with PHM of alcohol use disorder, polysubstance use disorder, medication non-adherence, Alcoholic peripheral neuropathy, OA, hyperuricemia, and malnutrition and admitted for  hypomagnesemia, hypokalemia, and seizure activity concerns in the context of alcohol withdrawal now being discharged on hospital day 0 with the following pertinent hospital course:  Alcohol use disorder Alcohol withdrawal Concern for withdrawal seizures Admitted for reported three witnessed seizures per husband; they both admitted to patient recently drinking alcohol. No Seizure activity on presentation. She was loaded with Keppra  and continued on her home dose of same medication. Counseled on cessation and restarting Naltrexone  at discharge. She is currently in the pre-contemplative stage. Will need continued counseling  Severe malnutrition Electrolyte disturbances Mg of 0.9 and K 3.1 with low oral intake and altered mentation due to alcohol intake. Suspect patient is chronically depleted. Received IV repletion, then transitioned to PO. At discharge, patient's Magnesium  is >2 and K 3.7. Recommended supplementation with Ensure.   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 Concern for cirrhosis given Fib4 score of 7.40. Recommend liver elastogram and variceal screening. Will need continued monitoring.  Chronic medical conditions: Alcoholic neuropathy: Continued her Gabapentin  and referred for [facility] PT at discharge Hyperuricemia: Continued on Allopurinol  100 mg BID GERD: pantoprazole  daily Hypothyroidism: Continued on levothyroxine  25 mcg  Discharge Exam:   BP 124/87 (BP Location: Left Arm)   Pulse (!) 106   Temp 99.2 F (37.3 C) (Oral)   Resp 20   Ht 5' 2 (1.575 m)   Wt 45.8 kg   LMP 08/23/2011   SpO2 100%   BMI 18.47 kg/m  Discharge exam:  General: Chronically ill appearing woman laying in bed. No acute distress. CV: RRR. No LE edema Pulmonary: Lungs CTAB.  Abdominal: Soft, nontender Skin: Warm and dry. No obvious rash or lesions. Neuro: A&Ox3.Psych: Normal mood and affect   Pertinent Labs, Studies, and Procedures:      Latest Ref Rng & Units 08/26/2023    7:09 AM 08/25/2023    8:50 AM 08/15/2023    4:37 PM  CBC  WBC 4.0 - 10.5 K/uL 4.9  5.2  6.0   Hemoglobin 12.0 - 15.0 g/dL 8.2  9.4  89.7   Hematocrit 36.0 - 46.0 % 23.9  26.9  32.2   Platelets 150 - 400 K/uL 83  82  244        Latest Ref Rng & Units 08/26/2023    7:09 AM 08/25/2023   10:15 PM 08/25/2023    9:25 AM  CMP  Glucose 70 - 99 mg/dL 90  89  869   BUN 6 - 20 mg/dL <5  <5  5   Creatinine 0.44 - 1.00 mg/dL 9.40  9.34  8.86   Sodium 135 - 145 mmol/L 140  138  136   Potassium 3.5 - 5.1 mmol/L 3.7  3.6  3.1   Chloride 98 - 111 mmol/L 108  104  100   CO2 22 - 32 mmol/L 23  24  23    Calcium  8.9 - 10.3 mg/dL 7.5  7.9  7.7   Total Protein 6.5 -  8.1 g/dL 5.1   5.9   Total Bilirubin 0.0 - 1.2 mg/dL 0.7   0.7   Alkaline Phos 38 - 126 U/L 87   101   AST 15 - 41 U/L 41   65   ALT 0 - 44 U/L 15   18     Portable chest 1 View Result Date: 08/25/2023 CLINICAL DATA:  Aspiration into airway.  Seizure. EXAM: PORTABLE CHEST 1 VIEW COMPARISON:  Radiograph 07/17/2023 FINDINGS: Patient's hand in arm project over the chest which limits assessment. There is no obvious focal airspace disease. Normal heart size with stable mediastinal contours. No pneumothorax or large pleural effusion. Chronic left shoulder arthropathy. IMPRESSION: Limited exam due to patient's hand and arm projecting over the chest. No obvious focal airspace disease. Electronically Signed   By: Andrea Gasman M.D.   On: 08/25/2023 15:59   CT Head Wo Contrast Result Date: 08/25/2023 CLINICAL DATA:  58 year old female with altered mental status. Seizures. EXAM: CT HEAD WITHOUT CONTRAST TECHNIQUE: Contiguous axial images were obtained from the base of the skull through the vertex without intravenous contrast. RADIATION DOSE REDUCTION: This exam was performed according to the departmental dose-optimization program which includes automated exposure control, adjustment of the mA and/or kV according to  patient size and/or use of iterative reconstruction technique. COMPARISON:  Brain MRI 07/05/2023.  Head CT 07/20/2023. FINDINGS: The patient was imaged while decubitus. Brain: Chronic encephalomalacia in the left inferior frontal gyrus redemonstrated. Stable cerebral volume. Chronic encephalomalacia in the left lateral occipital lobe, junction with the inferior left parietal lobe redemonstrated and stable. No midline shift, ventriculomegaly, mass effect, evidence of mass lesion, intracranial hemorrhage or evidence of cortically based acute infarction. Vascular: No suspicious intracranial vascular hyperdensity. Skull: Visible skull appears stable and intact. Sinuses/Orbits: Visualized paranasal sinuses and mastoids are stable and well aerated. Other: No acute scalp soft tissue finding identified. IMPRESSION: 1. Imaged while decubitus. No acute intracranial abnormality identified. 2. Stable chronic left hemisphere encephalomalacia. Electronically Signed   By: VEAR Hurst M.D.   On: 08/25/2023 10:53     Discharge Instructions: Discharge Instructions     Call MD for:  difficulty breathing, headache or visual disturbances   Complete by: As directed    Call MD for:  extreme fatigue   Complete by: As directed    Call MD for:  persistant dizziness or light-headedness   Complete by: As directed    Call MD for:  persistant nausea and vomiting   Complete by: As directed    Call MD for:  redness, tenderness, or signs of infection (pain, swelling, redness, odor or green/yellow discharge around incision site)   Complete by: As directed    Call MD for:  temperature >100.4   Complete by: As directed    Diet - low sodium heart healthy   Complete by: As directed    Discharge instructions   Complete by: As directed    Thank you for allowing us  to be part of your care. You were hospitalized for concerns of seizure activity and because your magnesium  and potassium were very low. We treated you with magnesium  and  potassium.  We discuss your need to quit alcohol as this is damaging your liver. There is time to quit, Ms. Baby. We recommend you start the Naltrexone , the medication you were discharged from the hospital with last time.   Lastly, please consider going to Alcoholic Anonymous here in [place]. You can Google the nearest meeting site.  It is important  that you continue on your multivitamin supplementation to prevent your electrolytes from going down to dangerous levels.  Continue the rest of the medications as you have been instruction.   FOLLOW UP APPOINTMENTS: You will be called from the Internal Medicine Clinic on Monday -Tuesday to schedule a follow up.   If you don't hear from us , please call [phone] to schedule the appointment. You will need a blood test to check on your magnesium  and potassium  Please call your PCP or our clinic if you have any questions or concerns, we may be able to help and keep you from a long and expensive emergency room wait. Our clinic and after hours phone number is [phone]. The best time to call is Monday through Friday 9 am to 4 pm but there is always someone available 24/7 if you have an emergency. If you need medication refills please notify your pharmacy one week in advance and they will send us  a request.   We are glad you are feeling better,   Internal Medicine Inpatient Teaching Service at [facility]   Face-to-face encounter (required for Medicare/Medicaid patients)   Complete by: As directed    I Hadassah Ala certify that this patient is under my care and that I, or a nurse practitioner or physician's assistant working with me, had a face-to-face encounter that meets the physician face-to-face encounter requirements with this patient on 08/26/2023. The encounter with the patient was in whole, or in part for the following medical condition(s) which is the primary reason for home health care (List medical condition): Neuropathy and  malnutrition, now with recent hospitalization and severe deconditioning   The encounter with the patient was in whole, or in part, for the following medical condition, which is the primary reason for home health care: Neuropathy and malnutrition, now with recent hospitalization and severe deconditioning   I certify that, based on my findings, the following services are medically necessary home health services: Physical therapy   Reason for Medically Necessary Home Health Services: Therapy- Therapeutic Exercises to Increase Strength and Endurance   My clinical findings support the need for the above services: Unsafe ambulation due to balance issues   Further, I certify that my clinical findings support that this patient is homebound due to: Unsafe ambulation due to balance issues   Home Health   Complete by: As directed    Face to face   To provide the following care/treatments: PT   Increase activity slowly   Complete by: As directed        Signed: Ala Hadassah, MD 08/26/2023, 12:29 PM

## 2023-08-26 NOTE — TOC Transition Note (Signed)
 Transition of Care [facility]) - Discharge Note   Patient Details  Name: Christina Byrd MRN: [id number] Date of Birth: 12/15/1965  Transition of Care [facility]) CM/SW Contact:  Andrez JULIANNA George, RN Phone Number: 08/26/2023, 1:06 PM   Clinical Narrative:     Pt is discharging home. CM is unable to arrange [facility] services due to her insurance. CM has talked with pt and spouse and they are in agreement with outpatient therapy. CM sent the referral to [facility]. Information on the AVS. She will call to schedule the first appointment.  Pt has needed DME at home.  Spouse provides needed transportation.  She denies non-compliance with medications at home. CM has gone over the importance of taking her medications as prescribed.    Final next level of care: OP Rehab Barriers to Discharge: No Barriers Identified   Patient Goals and CMS Choice     Choice offered to / list presented to : Patient      Discharge Placement                       Discharge Plan and Services Additional resources added to the After Visit Summary for                                       Social Drivers of Health (SDOH) Interventions SDOH Screenings   Food Insecurity: No Food Insecurity (07/20/2023)  Housing: Low Risk  (07/20/2023)  Transportation Needs: No Transportation Needs (07/20/2023)  Utilities: Not At Risk (07/20/2023)  Depression (PHQ2-9): Low Risk  (06/30/2023)  Tobacco Use: Medium Risk (08/25/2023)     Readmission Risk Interventions    06/22/2023    2:16 PM 11/21/2022    1:01 PM  Readmission Risk Prevention Plan  Transportation Screening Complete Complete  Medication Review Oceanographer) Complete Complete  PCP or Specialist appointment within 3-5 days of discharge Complete Complete  HRI or Home Care Consult Complete Complete  SW Recovery Care/Counseling Consult Complete   Palliative Care Screening Not Applicable Not Applicable  Skilled Nursing Facility Patient Refused Not  Applicable

## 2023-08-26 NOTE — Discharge Instructions (Signed)
 Thank you for allowing us  to be part of your care. You were hospitalized for concerns of seizure activity and because your magnesium  and potassium were very low. We treated you with magnesium  and potassium.  We discuss your need to quit alcohol as this is damaging your liver. There is time to quit, Christina Byrd. We recommend you start the Naltrexone , the medication you were discharged from the hospital with last time.   Lastly, please consider going to Alcoholic Anonymous here in [place]. You can Google the nearest meeting site.  It is important that you continue on your multivitamin supplementation to prevent your electrolytes from going down to dangerous levels.  Continue the rest of the medications as you have been instruction.   FOLLOW UP APPOINTMENTS: You will be called from the Internal Medicine Clinic on Monday -Tuesday to schedule a follow up.   If you don't hear from us , please call [phone] to schedule the appointment. You will need a blood test to check on your magnesium  and potassium  Please call your PCP or our clinic if you have any questions or concerns, we may be able to help and keep you from a long and expensive emergency room wait. Our clinic and after hours phone number is [phone]. The best time to call is Monday through Friday 9 am to 4 pm but there is always someone available 24/7 if you have an emergency. If you need medication refills please notify your pharmacy one week in advance and they will send us  a request.   We are glad you are feeling better,  Hadassah Ala Internal Medicine Inpatient Teaching Service at [facility]

## 2023-08-26 NOTE — Evaluation (Addendum)
 Physical Therapy Evaluation Patient Details Name: Christina Byrd MRN: [id number] DOB: Jul 14, 1965 Today's Date: 08/26/2023  History of Present Illness  58 y.o. female presents to [facility]25/23 with multiple witnessed seizures at home. Admitted for alcohol withdrawal. PMHx: CKD 3b, OA, alcohol abuse, alcoholic pancreatitis, seizure disorder, Wernicke's encephalopathy.  Clinical Impression  Pt in bed upon arrival and agreeable to PT eval. PTA, pt reported being independent for mobility with no AD. Pt was lethargic in today's session and was agreeable to sit on the EOB. CGA needed for bed mobility for safety. Pt declined further transfer and wanted to return to supine after sitting for ~5 minutes. Pt has intermittent level of assist available upon return home. Recommending post-acute HHPT pending pt is able to mobilize with ModI. Will continue to follow and adjust plan of care as appropriate. Pt would benefit from acute skilled PT with current functional limitations listed below (see PT Problem List). Acute PT to follow.         If plan is discharge home, recommend the following: A little help with walking and/or transfers;Help with stairs or ramp for entrance;Assist for transportation     Equipment Recommendations None recommended by PT     Functional Status Assessment Patient has had a recent decline in their functional status and demonstrates the ability to make significant improvements in function in a reasonable and predictable amount of time.     Precautions / Restrictions Precautions Precautions: Fall Restrictions Weight Bearing Restrictions Per Provider Order: No      Mobility  Bed Mobility Overal bed mobility: Needs Assistance Bed Mobility: Supine to Sit, Sit to Supine     Supine to sit: Contact guard Sit to supine: Contact guard assist   General bed mobility comments: CGA for safety    Transfers    General transfer comment: pt declined      Balance Overall balance  assessment: Needs assistance Sitting-balance support: No upper extremity supported, Feet supported Sitting balance-Leahy Scale: Good        Pertinent Vitals/Pain Pain Assessment Pain Assessment: No/denies pain    Home Living Family/patient expects to be discharged to:: Private residence Living Arrangements: Spouse/significant other Available Help at Discharge: Family;Available PRN/intermittently Type of Home: House Home Access: Ramped entrance       Home Layout: One level Home Equipment: Agricultural consultant (2 wheels);BSC/3in1;Shower seat      Prior Function Prior Level of Function : Independent/Modified Independent;History of Falls (last six months)  Mobility Comments: Ind with no AD, reports difficulty carrying objects at home ADLs Comments: pt reports being independent in self care and IADLs     Extremity/Trunk Assessment   Upper Extremity Assessment Upper Extremity Assessment: Defer to OT evaluation    Lower Extremity Assessment Lower Extremity Assessment: Generalized weakness    Cervical / Trunk Assessment Cervical / Trunk Assessment: Normal  Communication   Communication Communication: Impaired Factors Affecting Communication: Reduced clarity of speech    Cognition Arousal: Lethargic Behavior During Therapy: Flat affect   PT - Cognitive impairments: No apparent impairments    Following commands: Intact       Cueing Cueing Techniques: Verbal cues      PT Assessment Patient needs continued PT services  PT Problem List Decreased strength;Decreased activity tolerance;Decreased balance;Decreased mobility       PT Treatment Interventions DME instruction;Gait training;Functional mobility training;Therapeutic activities;Therapeutic exercise;Balance training;Neuromuscular re-education;Patient/family education    PT Goals (Current goals can be found in the Care Plan section)  Acute Rehab PT Goals Patient  Stated Goal: to go home PT Goal Formulation: With  patient Time For Goal Achievement: 09/09/23 Potential to Achieve Goals: Good    Frequency Min 2X/week     Co-evaluation   Reason for Co-Treatment: Necessary to address cognition/behavior during functional activity;For patient/therapist safety PT goals addressed during session: Mobility/safety with mobility;Balance         AM-PAC PT 6 Clicks Mobility  Outcome Measure Help needed turning from your back to your side while in a flat bed without using bedrails?: None Help needed moving from lying on your back to sitting on the side of a flat bed without using bedrails?: A Little Help needed moving to and from a bed to a chair (including a wheelchair)?: A Little Help needed standing up from a chair using your arms (e.g., wheelchair or bedside chair)?: A Little Help needed to walk in hospital room?: A Little Help needed climbing 3-5 steps with a railing? : A Lot 6 Click Score: 18    End of Session   Activity Tolerance: Patient limited by fatigue;Patient limited by lethargy Patient left: in bed;with call bell/phone within reach;with bed alarm set Nurse Communication: Mobility status PT Visit Diagnosis: Other abnormalities of gait and mobility (R26.89);Muscle weakness (generalized) (M62.81)    Time: 9074-9058 PT Time Calculation (min) (ACUTE ONLY): 16 min   Charges:   PT Evaluation $PT Eval Low Complexity: 1 Low   PT General Charges $$ ACUTE PT VISIT: 1 Visit       Kate ORN, PT, DPT Secure Chat Preferred  Rehab Office [phone]   Kate BRAVO Wendolyn 08/26/2023, 9:50 AM

## 2023-08-26 NOTE — TOC CAGE-AID Note (Signed)
 Transition of Care [facility]) - CAGE-AID Screening   Patient Details  Name: CARNISHA FELTZ MRN: [id number] Date of Birth: [birth year]  Transition of Care [facility]) CM/SW Contact:    Andrez JULIANNA George, RN Phone Number: 08/26/2023, 1:01 PM   Clinical Narrative:  Pt denied the need for inpatient/ outpatient alcohol counseling resources.   CAGE-AID Screening:    Have You Ever Felt You Ought to Cut Down on Your Drinking or Drug Use?: No Have People Annoyed You By Critizing Your Drinking Or Drug Use?: No Have You Felt Bad Or Guilty About Your Drinking Or Drug Use?: No Have You Ever Had a Drink or Used Drugs First Thing In The Morning to Steady Your Nerves or to Get Rid of a Hangover?: No CAGE-AID Score: 0  Substance Abuse Education Offered: Yes (refused)

## 2023-08-29 ENCOUNTER — Telehealth: Payer: Self-pay

## 2023-08-29 NOTE — Transitions of Care (Post Inpatient/ED Visit) (Unsigned)
   08/29/2023  Name: Christina Byrd MRN: [id number] DOB: 26-Oct-1965  Today's TOC FU Call Status: Today's TOC FU Call Status:: Unsuccessful Call (1st Attempt) Unsuccessful Call (1st Attempt) Date: 08/29/23  Attempted to reach the patient regarding the most recent Inpatient/ED visit.  Follow Up Plan: Additional outreach attempts will be made to reach the patient to complete the Transitions of Care (Post Inpatient/ED visit) call.   Signature Julian Lemmings, LPN [facility] Nurse Health Advisor Direct Dial [phone]

## 2023-08-30 NOTE — Transitions of Care (Post Inpatient/ED Visit) (Unsigned)
   08/30/2023  Name: Christina Byrd MRN: [id number] DOB: April 16, 1965  Today's TOC FU Call Status: Today's TOC FU Call Status:: Unsuccessful Call (2nd Attempt) Unsuccessful Call (1st Attempt) Date: 08/29/23 Unsuccessful Call (2nd Attempt) Date: 08/30/23  Attempted to reach the patient regarding the most recent Inpatient/ED visit.  Follow Up Plan: Additional outreach attempts will be made to reach the patient to complete the Transitions of Care (Post Inpatient/ED visit) call.   Signature Julian Lemmings, LPN [facility] Nurse Health Advisor Direct Dial [phone]

## 2023-08-31 NOTE — Transitions of Care (Post Inpatient/ED Visit) (Signed)
   08/31/2023  Name: Christina Byrd MRN: [id number] DOB: 27-Mar-1965  Today's TOC FU Call Status: Today's TOC FU Call Status:: Unsuccessful Call (3rd Attempt) Unsuccessful Call (1st Attempt) Date: 08/29/23 Unsuccessful Call (2nd Attempt) Date: 08/30/23 Unsuccessful Call (3rd Attempt) Date: 08/31/23  Attempted to reach the patient regarding the most recent Inpatient/ED visit.  Follow Up Plan: No further outreach attempts will be made at this time. We have been unable to contact the patient.  Signature Julian Lemmings, LPN [facility] Nurse Health Advisor Direct Dial [phone]

## 2023-09-12 ENCOUNTER — Encounter: Admitting: Student

## 2023-09-12 ENCOUNTER — Encounter: Payer: Self-pay | Admitting: Student

## 2023-10-10 ENCOUNTER — Other Ambulatory Visit (HOSPITAL_COMMUNITY): Payer: Self-pay

## 2023-10-10 ENCOUNTER — Telehealth: Admitting: *Deleted

## 2023-10-10 NOTE — Telephone Encounter (Signed)
 RTC to patient .  Is at [facility] now.

## 2023-10-10 NOTE — Telephone Encounter (Signed)
 RTC to patient stated that she had let her insurance lapse.  Patient is reapplying for the [facility].  Call to [facility] 30 tablets of her Keppra  will cost $134.0.  Patient can also use [facility]. In talking to patient does not have a computer to do the prescriptions. Any options for patient.  Copied from CRM #[id number]. Topic: Clinical - Prescription Issue >> Oct 10, 2023 10:16 AM Farrel B wrote: Reason for CRM: [id number] (H) patient called in regards to all her prescriptions that has been filled she advised the pharmacy informed her that her insurance has expired, and she could get partial of each medication, but cannot get the full fill. Patient states that two of her medications are extremely important one is for seizures and the other is heart medication. She is requesting someone call her back for assistance.

## 2023-10-10 NOTE — Telephone Encounter (Signed)
 RTC to patient.  Was unaware of the other insurance and has no number to call.  Plans to go to [facility] today and speak with the SW who has been assigned to her.  Asked patiento get the information about her Medicaid insurance so taht she can use this for her medications.  Patient was also given option of going to [facility] or using a [facility] to set up an account.  Patient will see her SW first and then give the Clinics a call tomorrow to let us  know her plans.  Has a couple of days of her medication left.

## 2023-10-10 NOTE — Progress Notes (Deleted)
 CARDIOLOGY CONSULT NOTE       Patient ID: Christina Byrd MRN: [id number] DOB/AGE: 09/23/1965 58 y.o.  Admit date: (Not on file) Referring Physician: Wyn NP Primary Physician: Harrie Bruckner, DO Primary Cardiologist: New Reason for Consultation: CHF  Active Problems:   * No active hospital problems. *   HPI:  58 y.o. last seen by me in 2014. She has history of ETOH abuse with EF at that time 20-25% She had rash with Benicar  and headaches with hydralazine . Care has been limited by lack of insurance and right/left heart cath not done. TTE done 04/2019 improved EF 50-55% She has had gastritis with nausea and vomiting frequently in past from reflux/GERD likely from her ETOH abuse. In setting of additional NSAI use she has had bleeding Duodenal ulcer and angiodyplasia of stomach. Last TTE done 12/24/22 showed full recovery of EF 60-65% normal RV and no significant valve dx She also has history of seizures not clear if related to ETOH. Last hospitalization 08/26/23 with ? Withdrawal seizures.  ***  ROS All other systems reviewed and negative except as noted above  Past Medical History:  Diagnosis Date   GASTROESOPHAGEAL REFLUX, NO ESOPHAGITIS 04/21/2006   Qualifier: Diagnosis of  By: WATT, JOANNE     GERD (gastroesophageal reflux disease) Dx 1995   Hot flash, menopausal 06/22/2016   Hot flashes 01/30/2020   HTN (hypertension)    Hypotension 10/21/2022   Patient's blood pressure at presentation was 87/65, follow up blood pressure was 81/58. Patient reports regurgitating everything she has taken po for the past month. Denies lightheadedness or dizziness. Discussed GI referral, though patient stated she would be going to the emergency room today for IV fluids.   -GI referral for upper endoscopy placed  -Patient planning on going to the ED for IV flu   Intertrigo 06/22/2016   Nausea & vomiting 12/23/2022   Nausea and vomiting 12/24/2022   Pain and swelling of left ankle 12/02/2014    Seizures (HCC)    Tendinitis of right hip flexor 07/23/2014   Tendonitis, Achilles, right 04/01/2014    Family History  Problem Relation Age of Onset   CAD Mother     Social History   Socioeconomic History   Marital status: Single    Spouse name: Not on file   Number of children: Not on file   Years of education: Not on file   Highest education level: Not on file  Occupational History   Not on file  Tobacco Use   Smoking status: Former    Current packs/day: 0.00    Types: Cigarettes    Quit date: 06/25/2012    Years since quitting: 11.2   Smokeless tobacco: Never  Vaping Use   Vaping status: Never Used  Substance and Sexual Activity   Alcohol use: Not Currently    Alcohol/week: 49.0 standard drinks of alcohol    Types: 14 Cans of beer, 35 Shots of liquor per week   Drug use: No   Sexual activity: Not Currently  Other Topics Concern   Not on file  Social History Narrative   Pt lives in single story home with her partner   Has 1 son   5 grandchildren   10th grade education   Works at [employer].    Right handed   Social Drivers of Health   Financial Resource Strain: Not on file  Food Insecurity: No Food Insecurity (07/20/2023)   Hunger Vital Sign    Worried About Running Out of  Food in the Last Year: Never true    Ran Out of Food in the Last Year: Never true  Transportation Needs: No Transportation Needs (07/20/2023)   PRAPARE - Administrator, Civil Service (Medical): No    Lack of Transportation (Non-Medical): No  Physical Activity: Not on file  Stress: Not on file  Social Connections: Not on file  Intimate Partner Violence: Not At Risk (07/20/2023)   Humiliation, Afraid, Rape, and Kick questionnaire    Fear of Current or Ex-Partner: No    Emotionally Abused: No    Physically Abused: No    Sexually Abused: No    Past Surgical History:  Procedure Laterality Date   BIOPSY  06/03/2022   Procedure: BIOPSY;  Surgeon: Stacia Glendia BRAVO, MD;   Location: [facility] ENDOSCOPY;  Service: Gastroenterology;;   COLONOSCOPY N/A 06/21/2023   Procedure: COLONOSCOPY;  Surgeon: Albertus Gordy HERO, MD;  Location: [facility] ENDOSCOPY;  Service: Gastroenterology;  Laterality: N/A;   ESOPHAGOGASTRODUODENOSCOPY N/A 12/29/2022   Procedure: ESOPHAGOGASTRODUODENOSCOPY (EGD);  Surgeon: Nandigam, Kavitha V, MD;  Location: [facility] ENDOSCOPY;  Service: Gastroenterology;  Laterality: N/A;   ESOPHAGOGASTRODUODENOSCOPY N/A 06/21/2023   Procedure: EGD (ESOPHAGOGASTRODUODENOSCOPY);  Surgeon: Albertus Gordy HERO, MD;  Location: [facility] ENDOSCOPY;  Service: Gastroenterology;  Laterality: N/A;   ESOPHAGOGASTRODUODENOSCOPY (EGD) WITH PROPOFOL  N/A 06/03/2022   Procedure: ESOPHAGOGASTRODUODENOSCOPY (EGD) WITH PROPOFOL ;  Surgeon: Stacia Glendia BRAVO, MD;  Location: [facility] ENDOSCOPY;  Service: Gastroenterology;  Laterality: N/A;   HOT HEMOSTASIS N/A 06/03/2022   Procedure: HOT HEMOSTASIS (ARGON PLASMA COAGULATION/BICAP);  Surgeon: Stacia Glendia BRAVO, MD;  Location: [facility] ENDOSCOPY;  Service: Gastroenterology;  Laterality: N/A;      Current Outpatient Medications:    acetaminophen  (TYLENOL ) 325 MG tablet, Take 2 tablets (650 mg total) by mouth every 6 (six) hours as needed for mild pain (pain score 1-3) or fever (or Fever >/= 101)., Disp: 30 tablet, Rfl: 0   allopurinol  (ZYLOPRIM ) 100 MG tablet, Take 2 tablets (200 mg total) by mouth daily. (Patient not taking: Reported on 08/26/2023), Disp: 60 tablet, Rfl: 3   atorvastatin  (LIPITOR) 20 MG tablet, Take 1 tablet (20 mg total) by mouth daily. (Patient not taking: Reported on 08/26/2023), Disp: 90 tablet, Rfl: 3   cetirizine  (ZYRTEC  ALLERGY) 10 MG tablet, Take 1 tablet (10 mg total) by mouth daily. (Patient not taking: Reported on 08/26/2023), Disp: 30 tablet, Rfl: 2   DULoxetine  (CYMBALTA ) 30 MG capsule, Take 1 capsule (30 mg total) by mouth daily. (Patient not taking: Reported on 08/26/2023), Disp: 30 capsule, Rfl: 2   feeding supplement (ENSURE ENLIVE / ENSURE PLUS) LIQD,  Take 237 mLs by mouth 3 (three) times daily between meals. (Patient not taking: Reported on 08/26/2023), Disp: 237 mL, Rfl: 12   folic acid  (FOLVITE ) 1 MG tablet, Take 1 tablet (1 mg total) by mouth daily. (Patient not taking: Reported on 08/26/2023), Disp: 30 tablet, Rfl: 2   gabapentin  (NEURONTIN ) 100 MG capsule, Take 1 capsule (100 mg total) by mouth 3 (three) times daily. (Patient not taking: Reported on 08/26/2023), Disp: 90 capsule, Rfl: 2   levETIRAcetam  (KEPPRA ) 750 MG tablet, TAKE 1 TABLET(750 MG) BY MOUTH TWICE DAILY (Patient not taking: Reported on 08/26/2023), Disp: 60 tablet, Rfl: 2   levothyroxine  (SYNTHROID ) 25 MCG tablet, Take 1 tablet (25 mcg total) by mouth daily before breakfast. (Patient not taking: Reported on 08/26/2023), Disp: 60 tablet, Rfl: 3   magnesium  oxide (MAG-OX) 400 (240 Mg) MG tablet, Take 1 tablet (400 mg total) by mouth daily. (  Patient not taking: Reported on 08/26/2023), Disp: 30 tablet, Rfl: 0   Multiple Vitamin (MULTIVITAMIN WITH MINERALS) TABS tablet, Take 1 tablet by mouth daily. (Patient not taking: Reported on 08/26/2023), Disp: 30 tablet, Rfl: 0   naltrexone  (DEPADE) 50 MG tablet, Take 1 tablet (50 mg total) by mouth daily. (Patient not taking: Reported on 08/26/2023), Disp: 30 tablet, Rfl: 0   pantoprazole  (PROTONIX ) 40 MG tablet, Take 1 tablet (40 mg total) by mouth 2 (two) times daily., Disp: 90 tablet, Rfl: 3   potassium chloride  (KLOR-CON ) 20 MEQ packet, Take 20 mEq by mouth daily. (Patient not taking: Reported on 08/26/2023), Disp: 50 each, Rfl: 3   thiamine  (VITAMIN B1) 100 MG tablet, Take 1 tablet (100 mg total) by mouth daily. (Patient not taking: Reported on 08/26/2023), Disp: 30 tablet, Rfl: 0    Physical Exam: Last menstrual period 08/23/2011.    Affect appropriate Chronically ill black female  HEENT: normal Neck supple with no adenopathy JVP normal no bruits no thyromegaly Lungs clear with no wheezing and good diaphragmatic motion Heart:  S1/S2 no murmur, no  rub, gallop or click PMI normal Abdomen: benighn, BS positve, no tenderness, no AAA no bruit.  No HSM or HJR Distal pulses intact with no bruits No edema Neuro non-focal Skin warm and dry No muscular weakness   Labs:   Lab Results  Component Value Date   WBC 4.9 08/26/2023   HGB 8.2 (L) 08/26/2023   HCT 23.9 (L) 08/26/2023   MCV 90.9 08/26/2023   PLT 83 (L) 08/26/2023   No results for input(s): NA, K, CL, CO2, BUN, CREATININE, CALCIUM , PROT, BILITOT, ALKPHOS, ALT, AST, GLUCOSE in the last 168 hours.  Invalid input(s): LABALBU Lab Results  Component Value Date   CKTOTAL 41 07/04/2023   CKMBINDEX <1.0 07/23/2016   TROPONINI <0.03 07/09/2016    Lab Results  Component Value Date   CHOL 154 04/27/2022   CHOL 119 10/05/2021   CHOL 171 11/18/2020   Lab Results  Component Value Date   HDL 87 04/27/2022   HDL 64 10/05/2021   HDL 78 11/18/2020   Lab Results  Component Value Date   LDLCALC 48 04/27/2022   LDLCALC 11 10/05/2021   LDLCALC 56 11/18/2020   Lab Results  Component Value Date   TRIG 109 04/27/2022   TRIG 323 (H) 10/05/2021   TRIG 241 (H) 11/18/2020   Lab Results  Component Value Date   CHOLHDL 1.8 04/27/2022   CHOLHDL 2.2 11/18/2020   CHOLHDL 2.3 01/02/2020   Lab Results  Component Value Date   LDLDIRECT 153 (H) 01/30/2008   LDLDIRECT 133 (H) 08/12/2006      Radiology: No results found.  EKG: 08/30/23 SR rate 88 normal    ASSESSMENT AND PLAN:   CHF:  history of with normalization of EF by TTE 12/24/22 GDMT limited by lack of insurance, compliance, and side effects. *** HTN:  *** GI:  GERD/Reflux with history of DU continue protonix  Seizure:  ? Related to ETOH continue Keppra  Thyroid:  on synthroid  replacement labs with primary TSH elevated 29 on 08/15/23   ***  F/U PRN   Signed: Maude Emmer 10/10/2023, 1:39 PM

## 2023-10-11 ENCOUNTER — Telehealth: Payer: Self-pay | Admitting: *Deleted

## 2023-10-11 NOTE — Telephone Encounter (Signed)
 RTC to patient stated has eaten fish in the past.  Was in the hospital 1 time and was told that she should not eat fish.  Stated she got sick after eating fish 1 time.  Does not think that it was due to the fish but, the medication she was on.  Copied from CRM [id number]. Topic: Clinical - Medical Advice >> Oct 11, 2023  4:02 PM Cherylann RAMAN wrote: Reason for CRM: Patient is wanting to know if she could eat seafood over the weekend for a celebration. Patient can be contacted back at [phone]

## 2023-10-11 NOTE — Telephone Encounter (Signed)
 RTC to patient saw the folks at [facility] yesterday.  They did inform her that she needed to call Medicaid [employer].  Patient did not know what to do with the information.  Call to [facility] patient's pharmacy spoke to Pharmacy Tech who will run patient's prescription for the Keppra .  Has to await approval from patient's insurance.  Patient does have active coverage.  Attempts to call patient.  Unable to leave a message that the Clinics had called.  Copied from CRM [id number]. Topic: Clinical - Prescription Issue >> Oct 11, 2023 10:28 AM Farrel B wrote: Reason for CRM: patient Ms. Grandmaison has called to speak with Ms. Kenneth in regards to prescription issues please call Ms. Depolo [phone]. I called [facility] to speak with Ms. Kenneth however, Ms. Trashetta stated she was on a call and requested I send a CRM over >> Oct 11, 2023 10:53 AM Farrel B wrote: Reopened chart to make sure Id sent crm to clinicals correctly

## 2023-10-12 ENCOUNTER — Other Ambulatory Visit: Payer: Self-pay | Admitting: Student

## 2023-10-12 NOTE — Telephone Encounter (Signed)
 RTC to patient informed her that Dr. Celestina said that there is no information in the Chart that talks about her not being able to eat seafood.  He is recommending that she does not.  Patient stated that she may take a few bites this weekend and see how it goes as she has had a few bites since she was told this in the hospital.  .

## 2023-10-14 ENCOUNTER — Inpatient Hospital Stay (HOSPITAL_COMMUNITY)
Admission: EM | Admit: 2023-10-14 | Discharge: 2023-10-20 | DRG: 640 | Disposition: A | Discharge status: Home / Self Care

## 2023-10-14 ENCOUNTER — Other Ambulatory Visit: Payer: Self-pay

## 2023-10-14 DIAGNOSIS — N179 Acute kidney failure, unspecified: Secondary | ICD-10-CM | POA: Diagnosis not present

## 2023-10-14 DIAGNOSIS — Z5971 Insufficient health insurance coverage: Secondary | ICD-10-CM

## 2023-10-14 DIAGNOSIS — D638 Anemia in other chronic diseases classified elsewhere: Secondary | ICD-10-CM | POA: Diagnosis present

## 2023-10-14 DIAGNOSIS — D61818 Other pancytopenia: Secondary | ICD-10-CM | POA: Diagnosis present

## 2023-10-14 DIAGNOSIS — Z8249 Family history of ischemic heart disease and other diseases of the circulatory system: Secondary | ICD-10-CM

## 2023-10-14 DIAGNOSIS — F1090 Alcohol use, unspecified, uncomplicated: Secondary | ICD-10-CM | POA: Diagnosis present

## 2023-10-14 DIAGNOSIS — E870 Hyperosmolality and hypernatremia: Secondary | ICD-10-CM | POA: Diagnosis present

## 2023-10-14 DIAGNOSIS — E876 Hypokalemia: Secondary | ICD-10-CM | POA: Diagnosis not present

## 2023-10-14 DIAGNOSIS — E785 Hyperlipidemia, unspecified: Secondary | ICD-10-CM | POA: Diagnosis not present

## 2023-10-14 DIAGNOSIS — F10220 Alcohol dependence with intoxication, uncomplicated: Secondary | ICD-10-CM | POA: Diagnosis not present

## 2023-10-14 DIAGNOSIS — Z79890 Hormone replacement therapy (postmenopausal): Secondary | ICD-10-CM

## 2023-10-14 DIAGNOSIS — R2681 Unsteadiness on feet: Secondary | ICD-10-CM | POA: Diagnosis present

## 2023-10-14 DIAGNOSIS — G621 Alcoholic polyneuropathy: Secondary | ICD-10-CM | POA: Diagnosis not present

## 2023-10-14 DIAGNOSIS — R64 Cachexia: Secondary | ICD-10-CM | POA: Diagnosis present

## 2023-10-14 DIAGNOSIS — Z681 Body mass index (BMI) 19 or less, adult: Secondary | ICD-10-CM

## 2023-10-14 DIAGNOSIS — Z79899 Other long term (current) drug therapy: Secondary | ICD-10-CM

## 2023-10-14 DIAGNOSIS — F1920 Other psychoactive substance dependence, uncomplicated: Secondary | ICD-10-CM

## 2023-10-14 DIAGNOSIS — D649 Anemia, unspecified: Secondary | ICD-10-CM | POA: Diagnosis not present

## 2023-10-14 DIAGNOSIS — R200 Anesthesia of skin: Secondary | ICD-10-CM

## 2023-10-14 DIAGNOSIS — F1010 Alcohol abuse, uncomplicated: Secondary | ICD-10-CM | POA: Diagnosis present

## 2023-10-14 DIAGNOSIS — E43 Unspecified severe protein-calorie malnutrition: Secondary | ICD-10-CM | POA: Diagnosis not present

## 2023-10-14 DIAGNOSIS — E039 Hypothyroidism, unspecified: Secondary | ICD-10-CM | POA: Diagnosis not present

## 2023-10-14 DIAGNOSIS — E162 Hypoglycemia, unspecified: Principal | ICD-10-CM | POA: Diagnosis present

## 2023-10-14 DIAGNOSIS — Z91148 Patient's other noncompliance with medication regimen for other reason: Secondary | ICD-10-CM

## 2023-10-14 DIAGNOSIS — Z91138 Patient's unintentional underdosing of medication regimen for other reason: Secondary | ICD-10-CM

## 2023-10-14 DIAGNOSIS — Z885 Allergy status to narcotic agent status: Secondary | ICD-10-CM

## 2023-10-14 DIAGNOSIS — R569 Unspecified convulsions: Secondary | ICD-10-CM

## 2023-10-14 DIAGNOSIS — H5500 Unspecified nystagmus: Secondary | ICD-10-CM | POA: Diagnosis present

## 2023-10-14 DIAGNOSIS — E11649 Type 2 diabetes mellitus with hypoglycemia without coma: Secondary | ICD-10-CM | POA: Diagnosis not present

## 2023-10-14 DIAGNOSIS — K219 Gastro-esophageal reflux disease without esophagitis: Secondary | ICD-10-CM

## 2023-10-14 DIAGNOSIS — E46 Unspecified protein-calorie malnutrition: Secondary | ICD-10-CM | POA: Diagnosis present

## 2023-10-14 DIAGNOSIS — Z8669 Personal history of other diseases of the nervous system and sense organs: Secondary | ICD-10-CM | POA: Diagnosis not present

## 2023-10-14 DIAGNOSIS — Z881 Allergy status to other antibiotic agents status: Secondary | ICD-10-CM

## 2023-10-14 DIAGNOSIS — Z888 Allergy status to other drugs, medicaments and biological substances status: Secondary | ICD-10-CM

## 2023-10-14 DIAGNOSIS — E512 Wernicke's encephalopathy: Secondary | ICD-10-CM | POA: Diagnosis present

## 2023-10-14 DIAGNOSIS — E860 Dehydration: Secondary | ICD-10-CM | POA: Diagnosis present

## 2023-10-14 DIAGNOSIS — E8729 Other acidosis: Secondary | ICD-10-CM | POA: Diagnosis present

## 2023-10-14 LAB — URINALYSIS, ROUTINE W REFLEX MICROSCOPIC
Bilirubin Urine: NEGATIVE
Glucose, UA: NEGATIVE mg/dL
Hgb urine dipstick: NEGATIVE
Ketones, ur: NEGATIVE mg/dL
Nitrite: NEGATIVE
Protein, ur: NEGATIVE mg/dL
Specific Gravity, Urine: 1.01 (ref 1.005–1.030)
pH: 6 (ref 5.0–8.0)

## 2023-10-14 LAB — VITAMIN B12: Vitamin B-12: 549 pg/mL (ref 180–914)

## 2023-10-14 LAB — CBC WITH DIFFERENTIAL/PLATELET
Abs Granulocyte: 4 K/uL (ref 1.5–6.5)
Abs Immature Granulocytes: 0.05 K/uL (ref 0.00–0.07)
Basophils Absolute: 0 K/uL (ref 0.0–0.1)
Basophils Relative: 1 %
Eosinophils Absolute: 0 K/uL (ref 0.0–0.5)
Eosinophils Relative: 0 %
HCT: 27.4 % — ABNORMAL LOW (ref 36.0–46.0)
Hemoglobin: 9 g/dL — ABNORMAL LOW (ref 12.0–15.0)
Immature Granulocytes: 1 %
Lymphocytes Relative: 15 %
Lymphs Abs: 0.8 K/uL (ref 0.7–4.0)
MCH: 33.1 pg (ref 26.0–34.0)
MCHC: 32.8 g/dL (ref 30.0–36.0)
MCV: 100.7 fL — ABNORMAL HIGH (ref 80.0–100.0)
Monocytes Absolute: 0.5 K/uL (ref 0.1–1.0)
Monocytes Relative: 9 %
Neutro Abs: 4 K/uL (ref 1.7–7.7)
Neutrophils Relative %: 74 %
Platelets: 152 K/uL (ref 150–400)
RBC: 2.72 MIL/uL — ABNORMAL LOW (ref 3.87–5.11)
RDW: 21.2 % — ABNORMAL HIGH (ref 11.5–15.5)
WBC: 5.4 K/uL (ref 4.0–10.5)
nRBC: 0 % (ref 0.0–0.2)

## 2023-10-14 LAB — BASIC METABOLIC PANEL WITH GFR
Anion gap: 10 (ref 5–15)
BUN: 10 mg/dL (ref 6–20)
CO2: 21 mmol/L — ABNORMAL LOW (ref 22–32)
Calcium: 6.9 mg/dL — ABNORMAL LOW (ref 8.9–10.3)
Chloride: 107 mmol/L (ref 98–111)
Creatinine, Ser: 0.94 mg/dL (ref 0.44–1.00)
GFR, Estimated: >60 mL/min (ref >60)
Glucose, Bld: 97 mg/dL (ref 70–99)
Potassium: 3.8 mmol/L (ref 3.5–5.1)
Sodium: 138 mmol/L (ref 135–145)

## 2023-10-14 LAB — GLUCOSE, CAPILLARY
Glucose-Capillary: 111 mg/dL — ABNORMAL HIGH (ref 70–99)
Glucose-Capillary: 107 mg/dL — ABNORMAL HIGH (ref 70–99)

## 2023-10-14 LAB — BETA-HYDROXYBUTYRIC ACID: Beta-Hydroxybutyric Acid: 2.25 mmol/L — ABNORMAL HIGH (ref 0.05–0.27)

## 2023-10-14 LAB — I-STAT CHEM 8, ED
BUN: 10 mg/dL (ref 6–20)
Calcium, Ion: 0.92 mmol/L — ABNORMAL LOW (ref 1.15–1.40)
Chloride: 110 mmol/L (ref 98–111)
Creatinine, Ser: 1.1 mg/dL — ABNORMAL HIGH (ref 0.44–1.00)
Glucose, Bld: 144 mg/dL — ABNORMAL HIGH (ref 70–99)
HCT: 33 % — ABNORMAL LOW (ref 36.0–46.0)
Hemoglobin: 11.2 g/dL — ABNORMAL LOW (ref 12.0–15.0)
Potassium: 3.8 mmol/L (ref 3.5–5.1)
Sodium: 143 mmol/L (ref 135–145)
TCO2: 15 mmol/L — ABNORMAL LOW (ref 22–32)

## 2023-10-14 LAB — COMPREHENSIVE METABOLIC PANEL WITH GFR
ALT: 25 U/L (ref 0–44)
AST: 173 U/L — ABNORMAL HIGH (ref 15–41)
Albumin: 2.2 g/dL — ABNORMAL LOW (ref 3.5–5.0)
Alkaline Phosphatase: 144 U/L — ABNORMAL HIGH (ref 38–126)
Anion gap: 23 — ABNORMAL HIGH (ref 5–15)
BUN: 10 mg/dL (ref 6–20)
CO2: 13 mmol/L — ABNORMAL LOW (ref 22–32)
Calcium: 7.3 mg/dL — ABNORMAL LOW (ref 8.9–10.3)
Chloride: 114 mmol/L — ABNORMAL HIGH (ref 98–111)
Creatinine, Ser: 1.17 mg/dL — ABNORMAL HIGH (ref 0.44–1.00)
GFR, Estimated: 54 mL/min — ABNORMAL LOW (ref >60)
Glucose, Bld: 109 mg/dL — ABNORMAL HIGH (ref 70–99)
Potassium: 3.2 mmol/L — ABNORMAL LOW (ref 3.5–5.1)
Sodium: 150 mmol/L — ABNORMAL HIGH (ref 135–145)
Total Bilirubin: 0.5 mg/dL (ref 0.0–1.2)
Total Protein: 5.9 g/dL — ABNORMAL LOW (ref 6.5–8.1)

## 2023-10-14 LAB — FOLATE: Folate: 5.8 ng/mL — ABNORMAL LOW (ref >5.9)

## 2023-10-14 LAB — PHOSPHORUS: Phosphorus: 4.9 mg/dL — ABNORMAL HIGH (ref 2.5–4.6)

## 2023-10-14 LAB — MAGNESIUM
Magnesium: 0.8 mg/dL — CL (ref 1.7–2.4)
Magnesium: 1.3 mg/dL — ABNORMAL LOW (ref 1.7–2.4)

## 2023-10-14 LAB — PROTIME-INR
INR: 1.1 (ref 0.8–1.2)
Prothrombin Time: 14.6 s (ref 11.4–15.2)

## 2023-10-14 LAB — TSH: TSH: 1.556 u[IU]/mL (ref 0.350–4.500)

## 2023-10-14 LAB — ETHANOL: Alcohol, Ethyl (B): <15 mg/dL (ref <15)

## 2023-10-14 LAB — CBG MONITORING, ED
Glucose-Capillary: 137 mg/dL — ABNORMAL HIGH (ref 70–99)
Glucose-Capillary: 24 mg/dL — CL (ref 70–99)
Glucose-Capillary: 156 mg/dL — ABNORMAL HIGH (ref 70–99)

## 2023-10-14 MED ORDER — ATORVASTATIN CALCIUM 10 MG PO TABS
20.0000 mg | ORAL_TABLET | Freq: Every day | ORAL | Status: DC
  Administered 2023-10-14 – 2023-10-20 (×7): 20 mg via ORAL
  Filled 2023-10-14 (×7): qty 2

## 2023-10-14 MED ORDER — ACETAMINOPHEN 325 MG PO TABS: 650.0000 mg | ORAL_TABLET | Freq: Four times a day (QID) | ORAL | Status: DC | PRN

## 2023-10-14 MED ORDER — ENSURE ENLIVE PO LIQD
237.0000 mL | Freq: Three times a day (TID) | ORAL | Status: DC
  Administered 2023-10-14 – 2023-10-20 (×4): 237 mL via ORAL
  Filled 2023-10-14 (×19): qty 237

## 2023-10-14 MED ORDER — LORAZEPAM 2 MG/ML IJ SOLN
0.5000 mg | Freq: Once | INTRAMUSCULAR | Status: AC
  Administered 2023-10-14: 0.5 mg via INTRAVENOUS
  Filled 2023-10-14: qty 1

## 2023-10-14 MED ORDER — DEXTROSE 50 % IV SOLN
INTRAVENOUS | Status: AC
  Administered 2023-10-14: 50 mL via INTRAVENOUS
  Filled 2023-10-14: qty 100

## 2023-10-14 MED ORDER — DEXTROSE-SODIUM CHLORIDE 5-0.45 % IV SOLN: INTRAVENOUS | Status: AC

## 2023-10-14 MED ORDER — LORAZEPAM 1 MG PO TABS: 0.0000 mg | ORAL_TABLET | Freq: Four times a day (QID) | ORAL | Status: DC

## 2023-10-14 MED ORDER — DIAZEPAM 5 MG/ML IJ SOLN: 5.0000 mg | Freq: Once | INTRAMUSCULAR | Status: DC

## 2023-10-14 MED ORDER — ACETAMINOPHEN 650 MG RE SUPP: 650.0000 mg | Freq: Four times a day (QID) | RECTAL | Status: DC | PRN

## 2023-10-14 MED ORDER — LEVOTHYROXINE SODIUM 25 MCG PO TABS
25.0000 ug | ORAL_TABLET | Freq: Every day | ORAL | Status: DC
  Administered 2023-10-15 – 2023-10-20 (×6): 25 ug via ORAL
  Filled 2023-10-14 (×8): qty 1

## 2023-10-14 MED ORDER — CHLORDIAZEPOXIDE HCL 25 MG PO CAPS: 100.0000 mg | ORAL_CAPSULE | Freq: Once | ORAL | Status: DC

## 2023-10-14 MED ORDER — DULOXETINE HCL 30 MG PO CPEP
30.0000 mg | ORAL_CAPSULE | Freq: Every day | ORAL | Status: DC
  Administered 2023-10-14 – 2023-10-20 (×7): 30 mg via ORAL
  Filled 2023-10-14 (×7): qty 1

## 2023-10-14 MED ORDER — LACTATED RINGERS IV BOLUS
1000.0000 mL | Freq: Once | INTRAVENOUS | Status: AC
  Administered 2023-10-14: 1000 mL via INTRAVENOUS

## 2023-10-14 MED ORDER — LORAZEPAM 1 MG PO TABS: 1.0000 mg | ORAL_TABLET | ORAL | Status: DC | PRN

## 2023-10-14 MED ORDER — MAGNESIUM SULFATE 2 GM/50ML IV SOLN
2.0000 g | Freq: Once | INTRAVENOUS | Status: AC
  Administered 2023-10-14: 2 g via INTRAVENOUS
  Filled 2023-10-14: qty 50

## 2023-10-14 MED ORDER — LORAZEPAM 2 MG/ML IJ SOLN: 1.0000 mg | INTRAMUSCULAR | Status: DC | PRN

## 2023-10-14 MED ORDER — GABAPENTIN 100 MG PO CAPS
100.0000 mg | ORAL_CAPSULE | Freq: Three times a day (TID) | ORAL | Status: DC
  Administered 2023-10-14 – 2023-10-20 (×17): 100 mg via ORAL
  Filled 2023-10-14 (×21): qty 1

## 2023-10-14 MED ORDER — LORAZEPAM 1 MG PO TABS: 0.0000 mg | ORAL_TABLET | Freq: Two times a day (BID) | ORAL | Status: DC

## 2023-10-14 MED ORDER — MAGNESIUM OXIDE -MG SUPPLEMENT 400 (240 MG) MG PO TABS
800.0000 mg | ORAL_TABLET | Freq: Once | ORAL | Status: AC
  Administered 2023-10-14: 800 mg via ORAL
  Filled 2023-10-14: qty 2

## 2023-10-14 MED ORDER — FOLIC ACID 1 MG PO TABS
1.0000 mg | ORAL_TABLET | Freq: Every day | ORAL | Status: DC
  Administered 2023-10-14 – 2023-10-20 (×7): 1 mg via ORAL
  Filled 2023-10-14 (×7): qty 1

## 2023-10-14 MED ORDER — MAGNESIUM SULFATE 2 GM/50ML IV SOLN
2.0000 g | Freq: Once | INTRAVENOUS | Status: AC
  Administered 2023-10-15: 2 g via INTRAVENOUS
  Filled 2023-10-14: qty 50

## 2023-10-14 MED ORDER — SENNOSIDES-DOCUSATE SODIUM 8.6-50 MG PO TABS: 1.0000 | ORAL_TABLET | Freq: Every evening | ORAL | Status: DC | PRN

## 2023-10-14 MED ORDER — ENOXAPARIN SODIUM 30 MG/0.3ML IJ SOSY
30.0000 mg | PREFILLED_SYRINGE | INTRAMUSCULAR | Status: DC
  Administered 2023-10-15 – 2023-10-20 (×6): 30 mg via SUBCUTANEOUS
  Filled 2023-10-14 (×6): qty 0.3

## 2023-10-14 MED ORDER — PANTOPRAZOLE SODIUM 40 MG PO TBEC
40.0000 mg | DELAYED_RELEASE_TABLET | Freq: Two times a day (BID) | ORAL | Status: DC
  Administered 2023-10-14 – 2023-10-19 (×10): 40 mg via ORAL
  Filled 2023-10-14 (×13): qty 1

## 2023-10-14 MED ORDER — LEVETIRACETAM 500 MG PO TABS
750.0000 mg | ORAL_TABLET | Freq: Two times a day (BID) | ORAL | Status: DC
  Administered 2023-10-14 – 2023-10-20 (×12): 750 mg via ORAL
  Filled 2023-10-14 (×18): qty 1

## 2023-10-14 MED ORDER — DEXTROSE 50 % IV SOLN: 50.0000 mL | Freq: Once | INTRAVENOUS | Status: AC

## 2023-10-14 MED ORDER — POTASSIUM CHLORIDE 20 MEQ PO PACK
60.0000 meq | PACK | ORAL | Status: AC
  Administered 2023-10-14 (×2): 60 meq via ORAL
  Filled 2023-10-14: qty 3

## 2023-10-14 NOTE — ED Notes (Signed)
 Pt has provided lunch meal tray and sandwhich bag and juice. Pt is not eating at this time and states she does not have an appetite. Explained to patient risks and benefits of eating/not eating.

## 2023-10-14 NOTE — ED Provider Notes (Signed)
 [place] EMERGENCY DEPARTMENT AT [facility] Provider Note   CSN: [id number] Arrival date & time: 10/14/23  1132     Patient presents with: Hypoglycemia and ams   Christina Byrd is a 58 y.o. female.   58 yo F with a chief complaint of hypoglycemia.  Patient's boyfriend noticed that she did not look great and so called 911.  Found to have a blood sugar in the 20s.  Improved with glucagon.  She tells me that she has been drinking quite a bit and has not been eating much.  She think she eats maybe 2 mini hotdogs a day.  She denies any abdominal pain there is no nausea or vomiting.  Denies any medications that would lower her blood sugar.   Hypoglycemia      Prior to Admission medications   Medication Sig Start Date End Date Taking? Authorizing Provider  acetaminophen  (TYLENOL ) 325 MG tablet Take 2 tablets (650 mg total) by mouth every 6 (six) hours as needed for mild pain (pain score 1-3) or fever (or Fever >/= 101). 07/27/23   Kandis Perkins, DO  allopurinol  (ZYLOPRIM ) 100 MG tablet TAKE 2 TABLETS(200 MG) BY MOUTH DAILY 10/12/23   Harrie Bruckner, DO  atorvastatin  (LIPITOR) 20 MG tablet Take 1 tablet (20 mg total) by mouth daily. Patient not taking: Reported on 08/26/2023 05/03/23   Harrie Bruckner, DO  cetirizine  (ZYRTEC  ALLERGY) 10 MG tablet Take 1 tablet (10 mg total) by mouth daily. Patient not taking: Reported on 08/26/2023 05/26/23 05/25/24  Harrie Bruckner, DO  DULoxetine  (CYMBALTA ) 30 MG capsule Take 1 capsule (30 mg total) by mouth daily. Patient not taking: Reported on 08/26/2023 05/26/23 05/25/24  Harrie Bruckner, DO  feeding supplement (ENSURE ENLIVE / ENSURE PLUS) LIQD Take 237 mLs by mouth 3 (three) times daily between meals. Patient not taking: Reported on 08/26/2023 12/30/22   Fernand Prost, MD  folic acid  (FOLVITE ) 1 MG tablet Take 1 tablet (1 mg total) by mouth daily. Patient not taking: Reported on 08/26/2023 05/03/23   Harrie Bruckner, DO  gabapentin   (NEURONTIN ) 100 MG capsule Take 1 capsule (100 mg total) by mouth 3 (three) times daily. Patient not taking: Reported on 08/26/2023 06/06/23 06/05/24  Kandis Perkins, DO  levETIRAcetam  (KEPPRA ) 750 MG tablet TAKE 1 TABLET(750 MG) BY MOUTH TWICE DAILY Patient not taking: Reported on 08/26/2023 08/11/23   Harrie Bruckner, DO  levothyroxine  (SYNTHROID ) 25 MCG tablet Take 1 tablet (25 mcg total) by mouth daily before breakfast. Patient not taking: Reported on 08/26/2023 08/16/23   McLendon, Michael, MD  magnesium  oxide (MAG-OX) 400 (240 Mg) MG tablet Take 1 tablet (400 mg total) by mouth daily. Patient not taking: Reported on 08/26/2023 07/27/23   Kandis Perkins, DO  Multiple Vitamin (MULTIVITAMIN WITH MINERALS) TABS tablet Take 1 tablet by mouth daily. Patient not taking: Reported on 08/26/2023 05/03/23   Harrie Bruckner, DO  naltrexone  (DEPADE) 50 MG tablet Take 1 tablet (50 mg total) by mouth daily. Patient not taking: Reported on 08/26/2023 07/27/23   Kandis Perkins, DO  pantoprazole  (PROTONIX ) 40 MG tablet Take 1 tablet (40 mg total) by mouth 2 (two) times daily. 05/03/23   Harrie Bruckner, DO  potassium chloride  (KLOR-CON ) 20 MEQ packet Take 20 mEq by mouth daily. Patient not taking: Reported on 08/26/2023 08/17/23   Norrine Sharper, MD  thiamine  (VITAMIN B1) 100 MG tablet Take 1 tablet (100 mg total) by mouth daily. Patient not taking: Reported on 08/26/2023 07/27/23   Kandis Perkins, DO  metFORMIN  (GLUCOPHAGE ) 500 MG tablet Take 0.5 tablets (250 mg total) by mouth daily with breakfast. 03/05/20 04/02/20  Danton Jon HERO, PA-C    Allergies: Levaquin  [levofloxacin  in d5w], Losartan  potassium, Ace inhibitors, Cefepime , Chlorhexidine, Codeine , and Vancomycin     Review of Systems  Updated Vital Signs BP 129/88 (BP Location: Right Arm)   Pulse 65   Temp 98 F (36.7 C) (Oral)   Resp 20   Ht 5' 6 (1.676 m)   Wt 43.5 kg   LMP 08/23/2011   SpO2 99%   BMI 15.49 kg/m   Physical Exam Vitals and nursing  note reviewed.  Constitutional:      General: She is not in acute distress.    Appearance: She is well-developed. She is not diaphoretic.  HENT:     Head: Normocephalic and atraumatic.  Eyes:     Pupils: Pupils are equal, round, and reactive to light.  Cardiovascular:     Rate and Rhythm: Normal rate and regular rhythm.     Heart sounds: No murmur heard.    No friction rub. No gallop.  Pulmonary:     Effort: Pulmonary effort is normal.     Breath sounds: No wheezing or rales.  Abdominal:     General: There is no distension.     Palpations: Abdomen is soft.     Tenderness: There is no abdominal tenderness.  Musculoskeletal:        General: No tenderness.     Cervical back: Normal range of motion and neck supple.  Skin:    General: Skin is warm and dry.  Neurological:     Mental Status: She is alert and oriented to person, place, and time.  Psychiatric:        Behavior: Behavior normal.     (all labs ordered are listed, but only abnormal results are displayed) Labs Reviewed  CBC WITH DIFFERENTIAL/PLATELET - Abnormal; Notable for the following components:      Result Value   RBC 2.72 (*)    Hemoglobin 9.0 (*)    HCT 27.4 (*)    MCV 100.7 (*)    RDW 21.2 (*)    All other components within normal limits  COMPREHENSIVE METABOLIC PANEL WITH GFR - Abnormal; Notable for the following components:   Sodium 150 (*)    Potassium 3.2 (*)    Chloride 114 (*)    CO2 13 (*)    Glucose, Bld 109 (*)    Creatinine, Ser 1.17 (*)    Calcium  7.3 (*)    Total Protein 5.9 (*)    Albumin 2.2 (*)    AST 173 (*)    Alkaline Phosphatase 144 (*)    GFR, Estimated 54 (*)    Anion gap 23 (*)    All other components within normal limits  MAGNESIUM  - Abnormal; Notable for the following components:   Magnesium  0.8 (*)    All other components within normal limits  URINALYSIS, ROUTINE W REFLEX MICROSCOPIC - Abnormal; Notable for the following components:   Leukocytes,Ua MODERATE (*)     Bacteria, UA RARE (*)    All other components within normal limits  CBG MONITORING, ED - Abnormal; Notable for the following components:   Glucose-Capillary 137 (*)    All other components within normal limits  CBG MONITORING, ED - Abnormal; Notable for the following components:   Glucose-Capillary 24 (*)    All other components within normal limits  CBG MONITORING, ED - Abnormal; Notable for the following  components:   Glucose-Capillary 156 (*)    All other components within normal limits  PROTIME-INR  VITAMIN B1  ETHANOL  LACTIC ACID, PLASMA  LACTIC ACID, PLASMA  BETA-HYDROXYBUTYRIC ACID    EKG: None  Radiology: No results found.   .Critical Care  Performed by: Emil Share, DO Authorized by: Emil Share, DO   Critical care provider statement:    Critical care time (minutes):  80   Critical care time was exclusive of:  Separately billable procedures and treating other patients   Critical care was time spent personally by me on the following activities:  Development of treatment plan with patient or surrogate, discussions with consultants, evaluation of patient's response to treatment, examination of patient, ordering and review of laboratory studies, ordering and review of radiographic studies, ordering and performing treatments and interventions, pulse oximetry, re-evaluation of patient's condition and review of old charts   Care discussed with: admitting provider      Medications Ordered in the ED  magnesium  sulfate IVPB 2 g 50 mL (2 g Intravenous New Bag/Given 10/14/23 1502)  potassium chloride  (KLOR-CON ) packet 60 mEq (60 mEq Oral Given 10/14/23 1427)  diazepam  (VALIUM ) injection 5 mg (has no administration in time range)  chlordiazePOXIDE  (LIBRIUM ) capsule 100 mg (has no administration in time range)  lactated ringers  bolus 1,000 mL (1,000 mLs Intravenous New Bag/Given 10/14/23 1509)  magnesium  oxide (MAG-OX) tablet 800 mg (800 mg Oral Given 10/14/23 1424)  dextrose  50 %  solution 50 mL (50 mLs Intravenous Given 10/14/23 1411)                                    Medical Decision Making Amount and/or Complexity of Data Reviewed Labs: ordered.  Risk OTC drugs. Prescription drug management.   58 yo F with a chief complaint of altered mental status.  Patient's significant other could not wake her up this morning and EMS was called.  Blood sugar of 22.  Improved with glucagon.  Patient has no complaints on arrival to the ED.  Will obtain blood work.  Recheck blood sugar.  Oral trial.  Patient's blood sugar remains stable however her sodium and chloride are significantly elevated.  She also has a metabolic acidosis with anion gap.  Could be a ketoacidosis.  Will give a bolus of IV fluids.  Check lactate and EtOH.  Will discuss with medicine for admission.  The patients results and plan were reviewed and discussed.   Any x-rays performed were independently reviewed by myself.   Differential diagnosis were considered with the presenting HPI.  Medications  magnesium  sulfate IVPB 2 g 50 mL (2 g Intravenous New Bag/Given 10/14/23 1502)  potassium chloride  (KLOR-CON ) packet 60 mEq (60 mEq Oral Given 10/14/23 1427)  diazepam  (VALIUM ) injection 5 mg (has no administration in time range)  chlordiazePOXIDE  (LIBRIUM ) capsule 100 mg (has no administration in time range)  lactated ringers  bolus 1,000 mL (1,000 mLs Intravenous New Bag/Given 10/14/23 1509)  magnesium  oxide (MAG-OX) tablet 800 mg (800 mg Oral Given 10/14/23 1424)  dextrose  50 % solution 50 mL (50 mLs Intravenous Given 10/14/23 1411)    Vitals:   10/14/23 1143 10/14/23 1148 10/14/23 1200 10/14/23 1502  BP:  110/60 130/82 129/88  Pulse:  79 70 65  Resp:  18  20  Temp:  (!) 97.4 F (36.3 C)  98 F (36.7 C)  TempSrc:  Oral  Oral  SpO2:  100% 100% 99%  Weight: 43.5 kg     Height: 5' 6 (1.676 m)       Final diagnoses:  Hypoglycemia    Admission/ observation were discussed with the admitting  physician, patient and/or family and they are comfortable with the plan.       Final diagnoses:  Hypoglycemia    ED Discharge Orders     None          Emil Share, DO 10/14/23 1512

## 2023-10-14 NOTE — ED Notes (Signed)
Lab in room to attempt lab draw.

## 2023-10-14 NOTE — ED Notes (Signed)
 This RN called the receiving RN to make aware of PT arrival and give updates about medications.

## 2023-10-14 NOTE — H&P (Signed)
 Date: 10/14/2023               Patient Name:  Christina Byrd MRN: [id number]  DOB: July 27, 1965 Age / Sex: 58 y.o., female   PCP: Harrie Bruckner, DO         Medical Service: Internal Medicine Teaching Service         Attending Physician: Dr. Dayton Eastern      First Contact: Viktoria King, DO}    Second Contact: Dr. Libby Blanch, DO          Pager Information: First Contact Pager: [phone]   Second Contact Pager: [phone]   SUBJECTIVE   Chief Complaint: Altered mental status  History of Present Illness: Christina Byrd is a 58 y.o. female with PMH of alcohol use disorder, polysubstance use disorder, medication nonadherence, alcoholic peripheral neuropathy, OA, and malnutrition.   Presents with altered mental status that her partner noticed in bed this morning.  Patient was not speaking coherently and grabbing at her shoulders and chest.  This concerned him and he called 911 and her sugars were in the 20s.  The patient remembers waking up in the ambulance with an IV in her arm, after glucagon.  Partner described it as when you walk through a spiderweb and you are trying to grab the spiderweb off of you.  Patient states that she drinks alcohol only in the morning and at night.  She has not been eating enough.  She eats 2 Vienna sausages a day, but that is it.  States that she has stopped drinking beer, she says that she drinks [employer].  She drinks at night in the morning to help her sleep.  In between drinking in the night in the morning she does work around the house.  Partner says that she has been weak recently and has not been cleaning and making as much food as usual. She has not taken her medications in the last 5 or 6 days, she was unable to get them because the pharmacy said there was an issue with her insurance.  Partner does notice that her urine has an odor, patient denies burning or itching with urination.  Patient denies recent changes in vision, changes in the  numbness and tingling in her toes, denies abdominal and back pain.  Patient does endorse vomiting after eating foods from restaurants, and after drinking the [place] sausage juice.  Patient denies sick contacts, diarrhea.  ED Course: Labs significant for  CBC WITH DIFFERENTIAL/PLATELET - Abnormal; Notable for the following components:      Result Value     RBC 2.72 (*)      Hemoglobin 9.0 (*)      HCT 27.4 (*)      MCV 100.7 (*)      RDW 21.2 (*)      All other components within normal limits  COMPREHENSIVE METABOLIC PANEL WITH GFR - Abnormal; Notable for the following components:    Sodium 150 (*)      Potassium 3.2 (*)      Chloride 114 (*)      CO2 13 (*)      Glucose, Bld 109 (*)      Creatinine, Ser 1.17 (*)      Calcium  7.3 (*)      Total Protein 5.9 (*)      Albumin 2.2 (*)      AST 173 (*)      Alkaline Phosphatase 144 (*)      GFR,  Estimated 54 (*)      Anion gap 23 (*)      All other components within normal limits  MAGNESIUM  - Abnormal; Notable for the following components:    Magnesium  0.8 (*)      All other components within normal limits  URINALYSIS, ROUTINE W REFLEX MICROSCOPIC - Abnormal; Notable for the following components:    Leukocytes,Ua MODERATE (*)      Bacteria, UA RARE (*)      All other components within normal limits  CBG MONITORING, ED - Abnormal; Notable for the following components:    Glucose-Capillary 137 (*)      All other components within normal limits  CBG MONITORING, ED - Abnormal; Notable for the following components:    Glucose-Capillary 24 (*)      All other components within normal limits  CBG MONITORING, ED - Abnormal; Notable for the following components:    Glucose-Capillary 156 (*)    Imaging none Received magnesium  sulfate, potassium chloride , diazepam , chlordiazepoxide , LR, magnesium  oxide, dextrose  50% Consulted IM TS  Meds:  Patient reported:  Tylenol  650 mg every 6 hours as needed Allopurinol  200 mg daily Atorvastatin   20 mg daily Cetirizine  10 mg daily Vitamin D  supplementation 50,000 units once a week for 8 doses Duloxetine  30 mg daily Ensure Folic acid  1 mg daily Gabapentin  100 mg 3 times daily Keppra  750 mg twice daily Levothyroxine  25 mcg daily Magnesium  oxide 400 mg daily Multivitamin Naltrexone  50 mg daily Pantoprazole  40 mg twice daily KCl 20 mill equivalents daily Thiamine  100 mg daily  Current Meds  Medication Sig   acetaminophen  (TYLENOL ) 325 MG tablet Take 2 tablets (650 mg total) by mouth every 6 (six) hours as needed for mild pain (pain score 1-3) or fever (or Fever >/= 101).   pantoprazole  (PROTONIX ) 40 MG tablet Take 1 tablet (40 mg total) by mouth 2 (two) times daily.    Past Medical History  Patient was recently hospitalized from 07/03-07/4 for alcohol use disorder and alcohol withdrawal.  Patient had seizures at that time.  Patient also had low electrolytes.  She was loaded with Keppra  and discharged with Keppra .  Patient also given naltrexone .  Patient also had multiple rounds of electrolytes with magnesium  as well as potassium.  Past Surgical History Past Surgical History:  Procedure Laterality Date   BIOPSY  06/03/2022   Procedure: BIOPSY;  Surgeon: Stacia Glendia BRAVO, MD;  Location: [facility] ENDOSCOPY;  Service: Gastroenterology;;   COLONOSCOPY N/A 06/21/2023   Procedure: COLONOSCOPY;  Surgeon: Albertus Gordy HERO, MD;  Location: [facility] ENDOSCOPY;  Service: Gastroenterology;  Laterality: N/A;   ESOPHAGOGASTRODUODENOSCOPY N/A 12/29/2022   Procedure: ESOPHAGOGASTRODUODENOSCOPY (EGD);  Surgeon: Nandigam, Kavitha V, MD;  Location: [facility] ENDOSCOPY;  Service: Gastroenterology;  Laterality: N/A;   ESOPHAGOGASTRODUODENOSCOPY N/A 06/21/2023   Procedure: EGD (ESOPHAGOGASTRODUODENOSCOPY);  Surgeon: Albertus Gordy HERO, MD;  Location: [facility] ENDOSCOPY;  Service: Gastroenterology;  Laterality: N/A;   ESOPHAGOGASTRODUODENOSCOPY (EGD) WITH PROPOFOL  N/A 06/03/2022   Procedure: ESOPHAGOGASTRODUODENOSCOPY (EGD) WITH  PROPOFOL ;  Surgeon: Stacia Glendia BRAVO, MD;  Location: [facility] ENDOSCOPY;  Service: Gastroenterology;  Laterality: N/A;   HOT HEMOSTASIS N/A 06/03/2022   Procedure: HOT HEMOSTASIS (ARGON PLASMA COAGULATION/BICAP);  Surgeon: Stacia Glendia BRAVO, MD;  Location: [facility] ENDOSCOPY;  Service: Gastroenterology;  Laterality: N/A;     Social:  Lives with: Spouse  Occupation: Not working Level of function: Depending in ADLs and iADLs  Support: Boyfriend that she has been with her for several decades. PCP: Dr. Lonni Africa, DO  Substance  use: Denies tobacco and recreational drug use, drinks alcohol every day  Family History:  Family History  Problem Relation Age of Onset   CAD Mother      Allergies: Allergies as of 10/14/2023 - Review Complete 10/14/2023  Allergen Reaction Noted   Levaquin  [levofloxacin  in d5w] Rash 02/06/2013   Losartan  potassium Rash 12/20/2012   Ace inhibitors Cough 01/30/2008   Cefepime  Rash 09/05/2012   Chlorhexidine Itching and Rash 07/09/2022   Codeine  Nausea Only 05/26/2015   Vancomycin  Rash 09/05/2012    Review of Systems: A complete ROS was negative except as per HPI.   OBJECTIVE:   Physical Exam: Blood pressure 129/88, pulse 65, temperature 98 F (36.7 C), temperature source Oral, resp. rate 20, height 5' 6 (1.676 m), weight 43.5 kg, last menstrual period 08/23/2011, SpO2 99%.  Constitutional: Cachectic-appearing female sitting in an ED bed, in no acute distress HENT: normocephalic atraumatic, mucous membranes moist, missing teeth Eyes: conjunctiva non-erythematous, nonicteric Neck: supple Cardiovascular: regular rate and rhythm, no m/r/g Pulmonary/Chest: normal work of breathing on room air, lungs clear to auscultation bilaterally Abdominal: soft, non-tender, non-distended, normal bowel sounds MSK: Minimal muscle tone Neurological: alert & oriented x 3, 4/5 strength in bilateral upper and lower extremities, nystagmus on H finger test Skin: Xerotic,  warm Psych: Normal mood  Labs: CBC    Component Value Date/Time   WBC 5.4 10/14/2023 1149   RBC 2.72 (Byrd) 10/14/2023 1149   HGB 11.2 (Byrd) 10/14/2023 1533   HGB 10.2 (Byrd) 08/15/2023 1637   HCT 33.0 (Byrd) 10/14/2023 1533   HCT 32.2 (Byrd) 08/15/2023 1637   PLT 152 10/14/2023 1149   PLT 244 08/15/2023 1637   MCV 100.7 (H) 10/14/2023 1149   MCV 98 (H) 08/15/2023 1637   MCH 33.1 10/14/2023 1149   MCHC 32.8 10/14/2023 1149   RDW 21.2 (H) 10/14/2023 1149   RDW 15.4 08/15/2023 1637   LYMPHSABS 0.8 10/14/2023 1149   LYMPHSABS 1.5 04/27/2022 1125   MONOABS 0.5 10/14/2023 1149   EOSABS 0.0 10/14/2023 1149   EOSABS 0.0 04/27/2022 1125   BASOSABS 0.0 10/14/2023 1149   BASOSABS 0.0 04/27/2022 1125     CMP     Component Value Date/Time   NA 143 10/14/2023 1533   NA 142 08/15/2023 1637   K 3.8 10/14/2023 1533   CL 110 10/14/2023 1533   CO2 13 (Byrd) 10/14/2023 1149   GLUCOSE 144 (H) 10/14/2023 1533   BUN 10 10/14/2023 1533   BUN 8 08/15/2023 1637   CREATININE 1.10 (H) 10/14/2023 1533   CREATININE 1.00 03/21/2014 1556   CALCIUM  7.3 (Byrd) 10/14/2023 1149   PROT 5.9 (Byrd) 10/14/2023 1149   PROT 5.6 (Byrd) 07/14/2023 1458   ALBUMIN 2.2 (Byrd) 10/14/2023 1149   ALBUMIN 2.7 (Byrd) 07/14/2023 1458   AST 173 (H) 10/14/2023 1149   ALT 25 10/14/2023 1149   ALKPHOS 144 (H) 10/14/2023 1149   BILITOT 0.5 10/14/2023 1149   BILITOT 0.3 07/14/2023 1458   GFRNONAA 54 (Byrd) 10/14/2023 1149   GFRNONAA >89 12/11/2013 1214   GFRAA 61 01/02/2020 1100   GFRAA >89 12/11/2013 1214    Imaging:  No results found.   EKG: None prior EKG in July with normal sinus rhythm and prolonged QT  ASSESSMENT & PLAN:   Assessment & Plan by Problem: Principal Problem:   Hypokalemia Active Problems:   Malnutrition (HCC)   Hypothyroidism   Alcohol abuse   Hypomagnesemia   Seizures (HCC)   Hypernatremia   Christina Byrd  Nary is a 58 y.o. person living with a history of alcohol use disorder, polysubstance use disorder,  medication non-adherence, Alcoholic peripheral neuropathy, OA, hyperuricemia, and malnutrition  who presented with hypoglycemia and admitted for electrolyte derangement on hospital day 0  Severe malnutrition with symptomatic hypoglycemia Electrolyte disturbances Patient has not been eating but 2 Vienna sausages a day, and drinks twice a day every day.  Patient was recently hospitalized, 7/3 - 7/4 for similar concerns.  Partner witnessed patient with altered mental status in bed this morning, patient was grabbing at her shoulders and chest like she was trying to pull off a spiderweb.  EMS called and found her sugars in the 20s.  Patient was much better after receiving glucagon in the emergency department.  Phosphorus 4.9 on admission.  Potassium 3.2, magnesium  0.8 on admission.  Magnesium  oxide and potassium chloride  given in ED.  Potassium increased to 3.8.  Folate 5.8 on admission, will supplement 1 mg.  Will continue D5 half-normal fluids. -Recheck magnesium  pending - Ensures  Alcohol use disorder History of withdrawal seizures Patient has extensive alcohol use disorder, the last time she drank was this morning around 4 AM before going back to bed.  Placed on CIWA protocol.  Patient was placed on Keppra  during the last admission for concern for withdrawal seizures.  Patient had nystagmus on H finger test, likely some component of Wernicke's. - Continue home Keppra  750 mg 2 times daily - Monitor CIWA scores, lorazepam  p.o.  Acute kidney injury Creatinine 1.1 on admission, was 0.59 on discharge in July. - RFT's in a.m.  Chronic anemia Hemoglobin 11.2 on admission, was 8.2 on discharge in July.  CBC in a.m.  Hypothyroidism  Continue home levothyroxine  25 mcg daily - TSH 1.556 on admission  GERD  -Continue home pantoprazole  40 mg twice daily p.o.  Hyperlipidemia - Continue home atorvastatin  20 mg daily  Alcoholic neuropathy  Continue home gabapentin  100 mg 3 times daily.  Vitamin B12  549 within normal limits, folate 5.8 low on admission. - Tylenol  650 mg every 6 hours as needed - Duloxetine  30 mg daily  Best practice: Diet: Normal VTE: Enoxaparin  IVF: D5,75cc/hr Code: Full  Disposition planning: Prior to Admission Living Arrangement: Home, living with partner Anticipated Discharge Location: Home  Dispo: Admit patient to Observation with expected length of stay less than 2 midnights.  Signed: Charmayne Holmes, DO Internal Medicine Resident  10/14/2023, 6:10 PM  On Call pager: [phone]

## 2023-10-14 NOTE — ED Notes (Signed)
 Called transport for PT to be taken upstairs.

## 2023-10-14 NOTE — Discharge Summary (Deleted)
 Date: 10/14/2023               Patient Name:  Christina Byrd MRN: [id number]  DOB: 30-Apr-1965 Age / Sex: 58 y.o., female   PCP: Harrie Bruckner, DO         Medical Service: Internal Medicine Teaching Service         Attending Physician: Dr. Dayton Eastern      First Contact: Viktoria King, DO}    Second Contact: Dr. Libby Blanch, DO          Pager Information: First Contact Pager: [phone]   Second Contact Pager: [phone]   SUBJECTIVE   Chief Complaint: Altered mental status  History of Present Illness: Christina Byrd is a 58 y.o. female with PMH of alcohol use disorder, polysubstance use disorder, medication nonadherence, alcoholic peripheral neuropathy, OA, and malnutrition.   Presents with altered mental status that her partner noticed in bed this morning.  Patient was not speaking coherently and grabbing at her shoulders and chest.  This concerned him and he called 911 and her sugars were in the 20s.  The patient remembers waking up in the ambulance with an IV in her arm, after glucagon.  Partner described it as when you walk through a spiderweb and you are trying to grab the spiderweb off of you.  Patient states that she drinks alcohol only in the morning and at night.  She has not been eating enough.  She eats 2 Vienna sausages a day, but that is it.  States that she has stopped drinking beer, she says that she drinks [employer].  She drinks at night in the morning to help her sleep.  In between drinking in the night in the morning she does work around the house.  Partner says that she has been weak recently and has not been cleaning and making as much food as usual. She has not taken her medications in the last 5 or 6 days, she was unable to get them because the pharmacy said there was an issue with her insurance.  Partner does notice that her urine has an odor, patient denies burning or itching with urination.  Patient denies recent changes in vision, changes in the  numbness and tingling in her toes, denies abdominal and back pain.  Patient does endorse vomiting after eating foods from restaurants, and after drinking the [place] sausage juice.  Patient denies sick contacts, diarrhea.  ED Course: Labs significant for  CBC WITH DIFFERENTIAL/PLATELET - Abnormal; Notable for the following components:      Result Value     RBC 2.72 (*)      Hemoglobin 9.0 (*)      HCT 27.4 (*)      MCV 100.7 (*)      RDW 21.2 (*)      All other components within normal limits  COMPREHENSIVE METABOLIC PANEL WITH GFR - Abnormal; Notable for the following components:    Sodium 150 (*)      Potassium 3.2 (*)      Chloride 114 (*)      CO2 13 (*)      Glucose, Bld 109 (*)      Creatinine, Ser 1.17 (*)      Calcium  7.3 (*)      Total Protein 5.9 (*)      Albumin 2.2 (*)      AST 173 (*)      Alkaline Phosphatase 144 (*)      GFR,  Estimated 54 (*)      Anion gap 23 (*)      All other components within normal limits  MAGNESIUM  - Abnormal; Notable for the following components:    Magnesium  0.8 (*)      All other components within normal limits  URINALYSIS, ROUTINE W REFLEX MICROSCOPIC - Abnormal; Notable for the following components:    Leukocytes,Ua MODERATE (*)      Bacteria, UA RARE (*)      All other components within normal limits  CBG MONITORING, ED - Abnormal; Notable for the following components:    Glucose-Capillary 137 (*)      All other components within normal limits  CBG MONITORING, ED - Abnormal; Notable for the following components:    Glucose-Capillary 24 (*)      All other components within normal limits  CBG MONITORING, ED - Abnormal; Notable for the following components:    Glucose-Capillary 156 (*)    Imaging none Received magnesium  sulfate, potassium chloride , diazepam , chlordiazepoxide , LR, magnesium  oxide, dextrose  50% Consulted IM TS  Meds:  Patient reported:  Tylenol  650 mg every 6 hours as needed Allopurinol  200 mg daily Atorvastatin   20 mg daily Cetirizine  10 mg daily Vitamin D  supplementation 50,000 units once a week for 8 doses Duloxetine  30 mg daily Ensure Folic acid  1 mg daily Gabapentin  100 mg 3 times daily Keppra  750 mg twice daily Levothyroxine  25 mcg daily Magnesium  oxide 400 mg daily Multivitamin Naltrexone  50 mg daily Pantoprazole  40 mg twice daily KCl 20 mill equivalents daily Thiamine  100 mg daily  Current Meds  Medication Sig   acetaminophen  (TYLENOL ) 325 MG tablet Take 2 tablets (650 mg total) by mouth every 6 (six) hours as needed for mild pain (pain score 1-3) or fever (or Fever >/= 101).   pantoprazole  (PROTONIX ) 40 MG tablet Take 1 tablet (40 mg total) by mouth 2 (two) times daily.    Past Medical History  Patient was recently hospitalized from 07/03-07/4 for alcohol use disorder and alcohol withdrawal.  Patient had seizures at that time.  Patient also had low electrolytes.  She was loaded with Keppra  and discharged with Keppra .  Patient also given naltrexone .  Patient also had multiple rounds of electrolytes with magnesium  as well as potassium.  Past Surgical History Past Surgical History:  Procedure Laterality Date   BIOPSY  06/03/2022   Procedure: BIOPSY;  Surgeon: Stacia Glendia BRAVO, MD;  Location: [facility] ENDOSCOPY;  Service: Gastroenterology;;   COLONOSCOPY N/A 06/21/2023   Procedure: COLONOSCOPY;  Surgeon: Albertus Gordy HERO, MD;  Location: [facility] ENDOSCOPY;  Service: Gastroenterology;  Laterality: N/A;   ESOPHAGOGASTRODUODENOSCOPY N/A 12/29/2022   Procedure: ESOPHAGOGASTRODUODENOSCOPY (EGD);  Surgeon: Nandigam, Kavitha V, MD;  Location: [facility] ENDOSCOPY;  Service: Gastroenterology;  Laterality: N/A;   ESOPHAGOGASTRODUODENOSCOPY N/A 06/21/2023   Procedure: EGD (ESOPHAGOGASTRODUODENOSCOPY);  Surgeon: Albertus Gordy HERO, MD;  Location: [facility] ENDOSCOPY;  Service: Gastroenterology;  Laterality: N/A;   ESOPHAGOGASTRODUODENOSCOPY (EGD) WITH PROPOFOL  N/A 06/03/2022   Procedure: ESOPHAGOGASTRODUODENOSCOPY (EGD) WITH  PROPOFOL ;  Surgeon: Stacia Glendia BRAVO, MD;  Location: [facility] ENDOSCOPY;  Service: Gastroenterology;  Laterality: N/A;   HOT HEMOSTASIS N/A 06/03/2022   Procedure: HOT HEMOSTASIS (ARGON PLASMA COAGULATION/BICAP);  Surgeon: Stacia Glendia BRAVO, MD;  Location: [facility] ENDOSCOPY;  Service: Gastroenterology;  Laterality: N/A;     Social:  Lives with: Spouse  Occupation: Not working Level of function: Depending in ADLs and iADLs  Support: Boyfriend that she has been with her for several decades. PCP: Dr. Lonni Africa, DO  Substance  use: Denies tobacco and recreational drug use, drinks alcohol every day  Family History:  Family History  Problem Relation Age of Onset   CAD Mother      Allergies: Allergies as of 10/14/2023 - Review Complete 10/14/2023  Allergen Reaction Noted   Levaquin  [levofloxacin  in d5w] Rash 02/06/2013   Losartan  potassium Rash 12/20/2012   Ace inhibitors Cough 01/30/2008   Cefepime  Rash 09/05/2012   Chlorhexidine Itching and Rash 07/09/2022   Codeine  Nausea Only 05/26/2015   Vancomycin  Rash 09/05/2012    Review of Systems: A complete ROS was negative except as per HPI.   OBJECTIVE:   Physical Exam: Blood pressure 129/88, pulse 65, temperature 98 F (36.7 C), temperature source Oral, resp. rate 20, height 5' 6 (1.676 m), weight 43.5 kg, last menstrual period 08/23/2011, SpO2 99%.  Constitutional: Cachectic-appearing female sitting in an ED bed, in no acute distress HENT: normocephalic atraumatic, mucous membranes moist, missing teeth Eyes: conjunctiva non-erythematous, nonicteric Neck: supple Cardiovascular: regular rate and rhythm, no m/r/g Pulmonary/Chest: normal work of breathing on room air, lungs clear to auscultation bilaterally Abdominal: soft, non-tender, non-distended, normal bowel sounds MSK: Minimal muscle tone Neurological: alert & oriented x 3, 4/5 strength in bilateral upper and lower extremities, nystagmus on H finger test Skin: Xerotic,  warm Psych: Normal mood  Labs: CBC    Component Value Date/Time   WBC 5.4 10/14/2023 1149   RBC 2.72 (L) 10/14/2023 1149   HGB 11.2 (L) 10/14/2023 1533   HGB 10.2 (L) 08/15/2023 1637   HCT 33.0 (L) 10/14/2023 1533   HCT 32.2 (L) 08/15/2023 1637   PLT 152 10/14/2023 1149   PLT 244 08/15/2023 1637   MCV 100.7 (H) 10/14/2023 1149   MCV 98 (H) 08/15/2023 1637   MCH 33.1 10/14/2023 1149   MCHC 32.8 10/14/2023 1149   RDW 21.2 (H) 10/14/2023 1149   RDW 15.4 08/15/2023 1637   LYMPHSABS 0.8 10/14/2023 1149   LYMPHSABS 1.5 04/27/2022 1125   MONOABS 0.5 10/14/2023 1149   EOSABS 0.0 10/14/2023 1149   EOSABS 0.0 04/27/2022 1125   BASOSABS 0.0 10/14/2023 1149   BASOSABS 0.0 04/27/2022 1125     CMP     Component Value Date/Time   NA 143 10/14/2023 1533   NA 142 08/15/2023 1637   K 3.8 10/14/2023 1533   CL 110 10/14/2023 1533   CO2 13 (L) 10/14/2023 1149   GLUCOSE 144 (H) 10/14/2023 1533   BUN 10 10/14/2023 1533   BUN 8 08/15/2023 1637   CREATININE 1.10 (H) 10/14/2023 1533   CREATININE 1.00 03/21/2014 1556   CALCIUM  7.3 (L) 10/14/2023 1149   PROT 5.9 (L) 10/14/2023 1149   PROT 5.6 (L) 07/14/2023 1458   ALBUMIN 2.2 (L) 10/14/2023 1149   ALBUMIN 2.7 (L) 07/14/2023 1458   AST 173 (H) 10/14/2023 1149   ALT 25 10/14/2023 1149   ALKPHOS 144 (H) 10/14/2023 1149   BILITOT 0.5 10/14/2023 1149   BILITOT 0.3 07/14/2023 1458   GFRNONAA 54 (L) 10/14/2023 1149   GFRNONAA >89 12/11/2013 1214   GFRAA 61 01/02/2020 1100   GFRAA >89 12/11/2013 1214    Imaging:  No results found.   EKG: None prior EKG in July with normal sinus rhythm and prolonged QT  ASSESSMENT & PLAN:   Assessment & Plan by Problem: Principal Problem:   Hypokalemia Active Problems:   Malnutrition (HCC)   Hypothyroidism   Alcohol abuse   Hypomagnesemia   Seizures (HCC)   Hypernatremia   Christina Byrd is a 58 y.o. person living with a history of alcohol use disorder, polysubstance use disorder,  medication non-adherence, Alcoholic peripheral neuropathy, OA, hyperuricemia, and malnutrition  who presented with hypoglycemia and admitted for electrolyte derangement on hospital day 0  Severe malnutrition with symptomatic hypoglycemia Electrolyte disturbances Patient has not been eating but 2 Vienna sausages a day, and drinks twice a day every day.  Patient was recently hospitalized, 7/3 - 7/4 for similar concerns.  Partner witnessed patient with altered mental status in bed this morning, patient was grabbing at her shoulders and chest like she was trying to pull off a spiderweb.  EMS called and found her sugars in the 20s.  Patient was much better after receiving glucagon in the emergency department.  Phosphorus 4.9 on admission.  Potassium 3.2, magnesium  0.8 on admission.  Magnesium  oxide and potassium chloride  given in ED.  Potassium increased to 3.8.  Folate 5.8 on admission, will supplement 1 mg.  Will continue D5 half-normal fluids. -Recheck magnesium  pending - Ensures  Alcohol use disorder History of withdrawal seizures Patient has extensive alcohol use disorder, the last time she drank was this morning around 4 AM before going back to bed.  Placed on CIWA protocol.  Patient was placed on Keppra  during the last admission for concern for withdrawal seizures.  Patient had nystagmus on H finger test, likely some component of Wernicke's. - Continue home Keppra  750 mg 2 times daily - Monitor CIWA scores, lorazepam  p.o.  Acute kidney injury Creatinine 1.1 on admission, was 0.59 on discharge in July. - RFT's in a.m.  Chronic anemia Hemoglobin 11.2 on admission, was 8.2 on discharge in July.  CBC in a.m.  Hypothyroidism  Continue home levothyroxine  25 mcg daily - TSH 1.556 on admission  GERD  -Continue home pantoprazole  40 mg twice daily p.o.  Hyperlipidemia - Continue home atorvastatin  20 mg daily  Alcoholic neuropathy  Continue home gabapentin  100 mg 3 times daily.  Vitamin B12  549 within normal limits, folate 5.8 low on admission. - Tylenol  650 mg every 6 hours as needed - Duloxetine  30 mg daily  Best practice: Diet: Normal VTE: Enoxaparin  IVF: D5,75cc/hr Code: Full  Disposition planning: Prior to Admission Living Arrangement: Home, living with partner Anticipated Discharge Location: Home  Dispo: Admit patient to Observation with expected length of stay less than 2 midnights.  Signed: Charmayne Holmes, DO Internal Medicine Resident  10/14/2023, 6:04 PM  On Call pager: [phone]

## 2023-10-14 NOTE — Hospital Course (Addendum)
  Severe malnutrition with symptomatic hypoglycemia Electrolyte disturbances Patient has not been eating but 2 Vienna sausages a day, and drinks twice a day every day.  Patient was recently hospitalized, 7/3 - 7/4 for similar concerns.  Patient came in with altered mental statusEMS called and found her sugars in the 20s.  Patient was much better after receiving glucagon in the emergency department.  Phosphorus 4.9 on admission.  Potassium 3.2, magnesium  0.8 on admission.  Potassium, magnesium , and other electrolytes were monitored during admission and repleted as needed. Patient was encouraged to have more PO intake.   Alcohol use disorder History of withdrawal seizures Patient has extensive alcohol use disorder,.  Placed on CIWA protocol.  Patient was placed on Keppra  during the last admission for concern for withdrawal seizures.  Patient had nystagmus on H finger test on admission, likely some component of Wernicke's.Continue home Keppra  750 mg 2 times daily. Given high dose thiamine  and then transitioned to PO.    Acute kidney injury Creatinine 1.1 on admission, was .86 at the time of discharge. Likely due to decreased PO intake    Chronic anemia Hemoglobin 11.2 on admission, but 7.8 at time of discharge. The hemoglobin on admission likely due to hemoconcentration in the setting of dehydration.Likely due to anemia of chronic disease based off of previous iron studies with ferritin 1284 and iron 119 and tibc 130 in May. Smear showed normal platelet morphology and WBC, but target cells for RBC. Folate at 5.8 on admission so given folic acid  supplementation.    Hypothyroidism  Continue home levothyroxine  25 mcg daily - TSH 1.556 on admission   GERD  -Continue home pantoprazole  40 mg twice daily p.o.   Hyperlipidemia - Continue home atorvastatin  20 mg daily   Alcoholic neuropathy  Continue home gabapentin  100 mg 3 times daily.  Vitamin B12 549 within normal limits, folate 5.8 low on  admission. - Tylenol  650 mg every 6 hours as needed - Duloxetine  30 mg daily

## 2023-10-14 NOTE — ED Triage Notes (Signed)
 Pt bib [facility] from home. Husband went to wake patient and found patient unresponsive. Ptar arrived and pt was still unresponsive patients sugar was 22. Ptar provided 1mg  glucagon. Upon ems arrival patient cbg was 31, ems provided 100 cc d10 and patients cbd went to 149. EMS reports bf stated patient sleeps all day and drinks all night. Pt a&0X3, a&0X4 baseline.   138/95 60 hr     *no hx of diabetes, seizure hx, pt boyfriend reports that patient does not take her meds.

## 2023-10-14 NOTE — Plan of Care (Signed)

## 2023-10-15 DIAGNOSIS — E43 Unspecified severe protein-calorie malnutrition: Secondary | ICD-10-CM | POA: Diagnosis not present

## 2023-10-15 DIAGNOSIS — F1920 Other psychoactive substance dependence, uncomplicated: Secondary | ICD-10-CM | POA: Diagnosis not present

## 2023-10-15 DIAGNOSIS — F10220 Alcohol dependence with intoxication, uncomplicated: Secondary | ICD-10-CM | POA: Diagnosis not present

## 2023-10-15 DIAGNOSIS — Z681 Body mass index (BMI) 19 or less, adult: Secondary | ICD-10-CM

## 2023-10-15 DIAGNOSIS — E512 Wernicke's encephalopathy: Secondary | ICD-10-CM

## 2023-10-15 DIAGNOSIS — E876 Hypokalemia: Secondary | ICD-10-CM | POA: Diagnosis not present

## 2023-10-15 LAB — MAGNESIUM: Magnesium: 2 mg/dL (ref 1.7–2.4)

## 2023-10-15 LAB — CBC
HCT: 23.6 % — ABNORMAL LOW (ref 36.0–46.0)
Hemoglobin: 8.1 g/dL — ABNORMAL LOW (ref 12.0–15.0)
MCH: 33.6 pg (ref 26.0–34.0)
MCHC: 34.3 g/dL (ref 30.0–36.0)
MCV: 97.9 fL (ref 80.0–100.0)
Platelets: 131 K/uL — ABNORMAL LOW (ref 150–400)
RBC: 2.41 MIL/uL — ABNORMAL LOW (ref 3.87–5.11)
RDW: 20.7 % — ABNORMAL HIGH (ref 11.5–15.5)
WBC: 4.3 K/uL (ref 4.0–10.5)
nRBC: 0 % (ref 0.0–0.2)

## 2023-10-15 LAB — GLUCOSE, CAPILLARY
Glucose-Capillary: 108 mg/dL — ABNORMAL HIGH (ref 70–99)
Glucose-Capillary: 224 mg/dL — ABNORMAL HIGH (ref 70–99)
Glucose-Capillary: 157 mg/dL — ABNORMAL HIGH (ref 70–99)
Glucose-Capillary: 153 mg/dL — ABNORMAL HIGH (ref 70–99)

## 2023-10-15 LAB — BASIC METABOLIC PANEL WITH GFR
Anion gap: 9 (ref 5–15)
BUN: 9 mg/dL (ref 6–20)
CO2: 19 mmol/L — ABNORMAL LOW (ref 22–32)
Calcium: 7.1 mg/dL — ABNORMAL LOW (ref 8.9–10.3)
Chloride: 109 mmol/L (ref 98–111)
Creatinine, Ser: 1.08 mg/dL — ABNORMAL HIGH (ref 0.44–1.00)
GFR, Estimated: 60 mL/min — ABNORMAL LOW (ref >60)
Glucose, Bld: 183 mg/dL — ABNORMAL HIGH (ref 70–99)
Potassium: 3.8 mmol/L (ref 3.5–5.1)
Sodium: 137 mmol/L (ref 135–145)

## 2023-10-15 MED ORDER — THIAMINE HCL 100 MG/ML IJ SOLN
500.0000 mg | Freq: Three times a day (TID) | INTRAVENOUS | Status: DC
  Administered 2023-10-15 – 2023-10-16 (×4): 500 mg via INTRAVENOUS
  Filled 2023-10-15 (×2): qty 5
  Filled 2023-10-15: qty 500
  Filled 2023-10-15 (×2): qty 5
  Filled 2023-10-15: qty 500

## 2023-10-15 MED ORDER — THIAMINE HCL 100 MG/ML IJ SOLN
500.0000 mg | Freq: Every day | INTRAVENOUS | Status: DC
  Administered 2023-10-15: 500 mg via INTRAVENOUS
  Filled 2023-10-15: qty 5

## 2023-10-15 NOTE — Progress Notes (Signed)
 Patient had a yellow MEWS score overnight due to HR. Patient was asymptomatic. Provider made aware and new orders placed.

## 2023-10-15 NOTE — Evaluation (Signed)
 Occupational Therapy Evaluation Patient Details Name: Christina Byrd MRN: [id number] DOB: [birth year] Today's Date: 10/15/2023   History of Present Illness   Christina Byrd is a 58 y.o. person living with a history of alcohol use disorder, polysubstance use disorder, medication non-adherence, Alcoholic peripheral neuropathy, OA, hyperuricemia, and malnutrition  who presented with hypoglycemia and admitted for electrolyte derangement.     Clinical Impressions Pt agreeable to stand EOB, but declined further mobility this session. She required minA for sit<>stand for stability and for powerup. Pt with 1x lob requiring assistance to correct. She endorses dizziness with the loss of balance but resolved upon sitting EOB. Pt reports her husband assists her with mobility when she feels dizzy. She will continue to benefit from skilled OT services to address stability, endurance, activity tolerance and safety awareness. Will continue to follow acutely     If plan is discharge home, recommend the following:   A little help with walking and/or transfers;A little help with bathing/dressing/bathroom;Assist for transportation;Direct supervision/assist for financial management;Direct supervision/assist for medications management     Functional Status Assessment   Patient has had a recent decline in their functional status and demonstrates the ability to make significant improvements in function in a reasonable and predictable amount of time.     Equipment Recommendations   None recommended by OT     Recommendations for Other Services         Precautions/Restrictions   Precautions Precautions: Fall Restrictions Weight Bearing Restrictions Per Provider Order: No     Mobility Bed Mobility Overal bed mobility: Modified Independent             General bed mobility comments: hob elevated and increased time and effort    Transfers Overall transfer level: Needs  assistance Equipment used: 1 person hand held assist Transfers: Sit to/from Stand Sit to Stand: Min assist           General transfer comment: minA to stand at EOB, pt declined further mobility      Balance Overall balance assessment: Needs assistance Sitting-balance support: No upper extremity supported, Feet supported Sitting balance-Leahy Scale: Fair Sitting balance - Comments: static sitting intact   Standing balance support: Bilateral upper extremity supported, During functional activity, Reliant on assistive device for balance Standing balance-Leahy Scale: Poor Standing balance comment: reliant on UE support                           ADL either performed or assessed with clinical judgement   ADL Overall ADL's : Needs assistance/impaired Eating/Feeding: Set up;Sitting   Grooming: Set up;Sitting   Upper Body Bathing: Set up;Sitting   Lower Body Bathing: Minimal assistance;Sit to/from stand   Upper Body Dressing : Set up;Sitting   Lower Body Dressing: Minimal assistance;Sit to/from stand   Toilet Transfer: Minimal assistance Toilet Transfer Details (indicate cue type and reason): pt stood from EOB, declined further mobility Toileting- Clothing Manipulation and Hygiene: Minimal assistance;Sit to/from stand       Functional mobility during ADLs: Minimal assistance General ADL Comments: pt agreeable to stand EOB but declined further mobility     Vision         Perception         Praxis         Pertinent Vitals/Pain Pain Assessment Pain Assessment: No/denies pain     Extremity/Trunk Assessment Upper Extremity Assessment Upper Extremity Assessment: Generalized weakness   Lower Extremity Assessment Lower Extremity Assessment: Generalized  weakness   Cervical / Trunk Assessment Cervical / Trunk Assessment: Normal   Communication Communication Communication: Impaired Factors Affecting Communication: Reduced clarity of speech    Cognition Arousal: Alert Behavior During Therapy: WFL for tasks assessed/performed Cognition: No family/caregiver present to determine baseline             OT - Cognition Comments: pt A&O to place, situation. stated president was [place], date was august 19                 Following commands: Intact       Cueing  General Comments      HR 115with standing   Exercises     Shoulder Instructions      Home Living Family/patient expects to be discharged to:: Private residence Living Arrangements: Spouse/significant other Available Help at Discharge: Family;Available PRN/intermittently Type of Home: House Home Access: Ramped entrance     Home Layout: One level     Bathroom Shower/Tub: Chief Strategy Officer: Standard Bathroom Accessibility: Yes How Accessible: Accessible via wheelchair Home Equipment: Rolling Walker (2 wheels);BSC/3in1;Shower seat   Additional Comments: pt reports her house is too cluttered for a RW to fit inside      Prior Functioning/Environment Prior Level of Function : Independent/Modified Independent;History of Falls (last six months)             Mobility Comments: pt reports independent without AD, reports her husband assists at times with she is dizzy ADLs Comments: she reports she is independent with self-care    OT Problem List: Decreased activity tolerance;Impaired balance (sitting and/or standing);Decreased safety awareness;Decreased knowledge of precautions;Decreased knowledge of use of DME or AE   OT Treatment/Interventions: Self-care/ADL training;Therapeutic exercise;Energy conservation;DME and/or AE instruction;Therapeutic activities;Patient/family education;Balance training      OT Goals(Current goals can be found in the care plan section)   Acute Rehab OT Goals Patient Stated Goal: to go home OT Goal Formulation: With patient Time For Goal Achievement: 10/29/23 Potential to Achieve Goals: Fair ADL  Goals Pt Will Perform Grooming: with modified independence;standing Pt Will Perform Lower Body Dressing: with modified independence;sit to/from stand Pt Will Transfer to Toilet: with modified independence;ambulating Additional ADL Goal #1: Pt will demonstrate independence with 3 fall prevention strategies.   OT Frequency:  Min 2X/week    Co-evaluation              AM-PAC OT 6 Clicks Daily Activity     Outcome Measure Help from another person eating meals?: A Little Help from another person taking care of personal grooming?: A Little Help from another person toileting, which includes using toliet, bedpan, or urinal?: A Little Help from another person bathing (including washing, rinsing, drying)?: A Little Help from another person to put on and taking off regular upper body clothing?: A Little Help from another person to put on and taking off regular lower body clothing?: A Little 6 Click Score: 18   End of Session Equipment Utilized During Treatment: Gait belt Nurse Communication: Mobility status  Activity Tolerance: Patient limited by fatigue Patient left: in bed;with call bell/phone within reach;with bed alarm set  OT Visit Diagnosis: Other abnormalities of gait and mobility (R26.89);Muscle weakness (generalized) (M62.81);Other symptoms and signs involving cognitive function                Time: 8668-8657 OT Time Calculation (min): 11 min Charges:  OT General Charges $OT Visit: 1 Visit OT Evaluation $OT Eval Low Complexity: 1 Low  Kenlynn Houde OTR/L Acute  Rehabilitation Services Office: [phone]   Verneita ONEIDA Moose 10/15/2023, 2:58 PM

## 2023-10-15 NOTE — Plan of Care (Signed)
  Problem: Education: Goal: Knowledge of General Education information will improve Description: Including pain rating scale, medication(s)/side effects and non-pharmacologic comfort measures Outcome: Progressing   Problem: Health Behavior/Discharge Planning: Goal: Ability to manage health-related needs will improve Outcome: Progressing   Problem: Clinical Measurements: Goal: Will remain free from infection Outcome: Progressing   Problem: Coping: Goal: Level of anxiety will decrease Outcome: Progressing   Problem: Nutrition: Goal: Adequate nutrition will be maintained Outcome: Not Progressing

## 2023-10-15 NOTE — Progress Notes (Addendum)
 HD#0 SUBJECTIVE:  Patient Summary: Christina Byrd is a 58 y.o. person living with a history of alcohol use disorder, polysubstance use disorder, medication non-adherence, Alcoholic peripheral neuropathy, OA, hyperuricemia, and malnutrition  who presented with hypoglycemia and admitted for electrolyte derangement.   Overnight Events: mg 1.3, ordered IV Mg 2g  Interim History: Patient has been able to tolerate ice cream and some of her pancake this morning.  She did not feel nauseous or throw up after eating.  Patient slept from 830 last night until 830 this morning.  She is not stable on her feet when she gets up to walk, has to help her walk to the bathroom.  Patient says she is going to stop drinking and start eating more when she leaves the hospital.  Patient states that alcohol has lost its taste, it takes more like water  than alcohol.  Denies pain anywhere, denies chest pain, she does feel like her eyes and nose watering more than they used to.   Patient knows she is in the hospital, she thought today was October 29 and that the president was Obama. Encouraged patient to continue eating, avoid alcohol and stay hydrated.  OBJECTIVE:  Vital Signs: Vitals:   10/14/23 2219 10/14/23 2353 10/15/23 0302 10/15/23 0441  BP: 104/77 103/78 104/76 110/81  Pulse: (!) 110 (!) 106 (!) 105 98  Resp:  19  18  Temp:  98 F (36.7 C)  97.8 F (36.6 C)  TempSrc:      SpO2: 98% 96%  99%  Weight:      Height:       Supplemental O2: Room Air SpO2: 99 %  Filed Weights   10/14/23 1143  Weight: 43.5 kg     Intake/Output Summary (Last 24 hours) at 10/15/2023 0507 Last data filed at 10/14/2023 1632 Gross per 24 hour  Intake 1050.58 ml  Output --  Net 1050.58 ml   Net IO Since Admission: 1,050.58 mL [10/15/23 0507]  Physical Exam: Physical Exam Vitals reviewed.  Constitutional:      Appearance: Normal appearance. She is not ill-appearing.  HENT:     Nose: Nose normal.     Mouth/Throat:      Mouth: Mucous membranes are moist.     Pharynx: Oropharynx is clear.  Eyes:     Extraocular Movements: Extraocular movements intact.  Cardiovascular:     Rate and Rhythm: Normal rate and regular rhythm.     Heart sounds: Normal heart sounds.  Pulmonary:     Effort: Pulmonary effort is normal.     Breath sounds: Normal breath sounds.  Abdominal:     General: Bowel sounds are normal.     Palpations: Abdomen is soft.  Musculoskeletal:     Right lower leg: No edema.     Left lower leg: No edema.  Skin:    General: Skin is warm.  Neurological:     General: No focal deficit present.     Mental Status: She is alert. Mental status is at baseline.     Comments: No nystagmus on H finger test Patient oriented to place and person, not time and did not know the president     Patient Lines/Drains/Airways Status     Active Line/Drains/Airways     Name Placement date Placement time Site Days   Peripheral IV 10/14/23 22 G Anterior;Distal;Left Forearm 10/14/23  --  Forearm  1   Wound / Incision (Open or Dehisced) 12/23/22 Irritant Dermatitis (Moisture Associated Skin Damage) Other (Comment)  Right;Left 12/23/22  2110  Other (Comment)  296            Pertinent labs and imaging:      Latest Ref Rng & Units 10/14/2023    3:33 PM 10/14/2023   11:49 AM 08/26/2023    7:09 AM  CBC  WBC 4.0 - 10.5 K/uL  5.4  4.9   Hemoglobin 12.0 - 15.0 g/dL 88.7  9.0  8.2   Hematocrit 36.0 - 46.0 % 33.0  27.4  23.9   Platelets 150 - 400 K/uL  152  83        Latest Ref Rng & Units 10/14/2023   10:52 PM 10/14/2023    3:33 PM 10/14/2023   11:49 AM  CMP  Glucose 70 - 99 mg/dL 97  855  890   BUN 6 - 20 mg/dL 10  10  10    Creatinine 0.44 - 1.00 mg/dL 9.05  8.89  8.82   Sodium 135 - 145 mmol/L 138  143  150   Potassium 3.5 - 5.1 mmol/L 3.8  3.8  3.2   Chloride 98 - 111 mmol/L 107  110  114   CO2 22 - 32 mmol/L 21   13   Calcium  8.9 - 10.3 mg/dL 6.9   7.3   Total Protein 6.5 - 8.1 g/dL   5.9   Total  Bilirubin 0.0 - 1.2 mg/dL   0.5   Alkaline Phos 38 - 126 U/L   144   AST 15 - 41 U/L   173   ALT 0 - 44 U/L   25     No results found.  ASSESSMENT/PLAN:  Assessment: Principal Problem:   Hypokalemia Active Problems:   Malnutrition (HCC)   Hypothyroidism   Alcohol abuse   Hypomagnesemia   Seizures (HCC)   Hypernatremia   Plan: Christina Byrd is a 58 y.o. person living with a history of alcohol use disorder, polysubstance use disorder, medication non-adherence, Alcoholic peripheral neuropathy, OA, hyperuricemia, and malnutrition  who presented with hypoglycemia and admitted for electrolyte derangement on hospital day 1   Severe malnutrition with symptomatic hypoglycemia Electrolyte disturbances Wernickie's Encephalopathy Hypokalemia Hypomagnesemia Potassium 3.2, magnesium  0.8 on admission.  Magnesium  oxide and potassium chloride  given in ED.  Folate 5.8 on admission, will supplement 1 mg.  Will continue D5 half-normal fluids.  Patient did not ambulate during the encounter today but describes not being able to walk straight to the bathroom and needing assistance.  Not oriented to time.  Patient did not have ophthalmoplegia on exam today, did have nystagmus in ED. Will start thiamine  500 mg IV 3 times daily in the setting of alcohol use disorder and concern for Wernicke encephalopathy. -PT OT ordered  -B1 pending -Potassium 3.8 and magnesium  2 this morning after supplementation - Ensures ordered for increased calories   Alcohol use disorder History of withdrawal seizures Patient was placed on Keppra  during the last admission for concern for withdrawal seizures. CIWA scores have been 0 since admission.  Ethanol less than 15 on admission. -Continue to encourage alcohol avoidance - Continue home Keppra  750 mg 2 times daily - Monitor CIWA scores, lorazepam  p.o.   Acute kidney injury Creatinine 1.1 on admission, was 0.59 on discharge in July.  Serum creatinine 1.08 today.   Likely in the setting of dehydration. - RFT's in a.m.   Chronic anemia Hemoglobin 11.2 on admission, was 8.2 on discharge in July and 8.1 today.  The hemoglobin on admission likely due to  hemoconcentration in the setting of dehydration.   Hypothyroidism  Continue home levothyroxine  25 mcg daily.  TSH was 29 in June of this year, patient appears to be taking this home medicine. - TSH 1.556 on admission   GERD  -Continue home pantoprazole  40 mg twice daily p.o.   Hyperlipidemia - Continue home atorvastatin  20 mg daily   Alcoholic neuropathy  Continue home gabapentin  100 mg 3 times daily.   - Tylenol  650 mg every 6 hours as needed - Duloxetine  30 mg daily  Best Practice: Diet: Regular diet IVF: Fluids: D5-0.45NS, VTE: enoxaparin  (LOVENOX ) injection 30 mg Start: 10/15/23 1000 Code: Full  Disposition planning: Therapy Recs: Pending, DME: none Family Contact: boyfriend DISPO: Anticipated discharge tomorrow to Home pending medical improvement.  Signature:  Viktoria Charmayne [facility]  5:07 AM, 10/15/2023  On Call pager [phone]

## 2023-10-15 NOTE — Plan of Care (Signed)

## 2023-10-16 DIAGNOSIS — Z885 Allergy status to narcotic agent status: Secondary | ICD-10-CM | POA: Diagnosis not present

## 2023-10-16 DIAGNOSIS — Z8249 Family history of ischemic heart disease and other diseases of the circulatory system: Secondary | ICD-10-CM | POA: Diagnosis not present

## 2023-10-16 DIAGNOSIS — F1010 Alcohol abuse, uncomplicated: Secondary | ICD-10-CM | POA: Diagnosis not present

## 2023-10-16 DIAGNOSIS — E43 Unspecified severe protein-calorie malnutrition: Secondary | ICD-10-CM | POA: Diagnosis not present

## 2023-10-16 DIAGNOSIS — N179 Acute kidney failure, unspecified: Secondary | ICD-10-CM | POA: Diagnosis not present

## 2023-10-16 DIAGNOSIS — E8729 Other acidosis: Secondary | ICD-10-CM | POA: Diagnosis not present

## 2023-10-16 DIAGNOSIS — Z79899 Other long term (current) drug therapy: Secondary | ICD-10-CM | POA: Diagnosis not present

## 2023-10-16 DIAGNOSIS — E870 Hyperosmolality and hypernatremia: Secondary | ICD-10-CM | POA: Diagnosis not present

## 2023-10-16 DIAGNOSIS — E785 Hyperlipidemia, unspecified: Secondary | ICD-10-CM | POA: Diagnosis not present

## 2023-10-16 DIAGNOSIS — E860 Dehydration: Secondary | ICD-10-CM | POA: Diagnosis not present

## 2023-10-16 DIAGNOSIS — Z881 Allergy status to other antibiotic agents status: Secondary | ICD-10-CM | POA: Diagnosis not present

## 2023-10-16 DIAGNOSIS — E876 Hypokalemia: Secondary | ICD-10-CM | POA: Diagnosis not present

## 2023-10-16 DIAGNOSIS — G621 Alcoholic polyneuropathy: Secondary | ICD-10-CM | POA: Diagnosis not present

## 2023-10-16 DIAGNOSIS — E512 Wernicke's encephalopathy: Secondary | ICD-10-CM | POA: Diagnosis not present

## 2023-10-16 DIAGNOSIS — D61818 Other pancytopenia: Secondary | ICD-10-CM | POA: Diagnosis not present

## 2023-10-16 DIAGNOSIS — D696 Thrombocytopenia, unspecified: Secondary | ICD-10-CM

## 2023-10-16 DIAGNOSIS — Z79890 Hormone replacement therapy (postmenopausal): Secondary | ICD-10-CM | POA: Diagnosis not present

## 2023-10-16 DIAGNOSIS — Z681 Body mass index (BMI) 19 or less, adult: Secondary | ICD-10-CM | POA: Diagnosis not present

## 2023-10-16 DIAGNOSIS — E162 Hypoglycemia, unspecified: Secondary | ICD-10-CM | POA: Diagnosis not present

## 2023-10-16 DIAGNOSIS — D638 Anemia in other chronic diseases classified elsewhere: Secondary | ICD-10-CM | POA: Diagnosis not present

## 2023-10-16 DIAGNOSIS — K219 Gastro-esophageal reflux disease without esophagitis: Secondary | ICD-10-CM | POA: Diagnosis not present

## 2023-10-16 DIAGNOSIS — E039 Hypothyroidism, unspecified: Secondary | ICD-10-CM | POA: Diagnosis not present

## 2023-10-16 DIAGNOSIS — R64 Cachexia: Secondary | ICD-10-CM | POA: Diagnosis not present

## 2023-10-16 LAB — BASIC METABOLIC PANEL WITH GFR
Anion gap: 9 (ref 5–15)
BUN: 5 mg/dL — ABNORMAL LOW (ref 6–20)
CO2: 20 mmol/L — ABNORMAL LOW (ref 22–32)
Calcium: 7.3 mg/dL — ABNORMAL LOW (ref 8.9–10.3)
Chloride: 107 mmol/L (ref 98–111)
Creatinine, Ser: 1.07 mg/dL — ABNORMAL HIGH (ref 0.44–1.00)
GFR, Estimated: >60 mL/min (ref >60)
Glucose, Bld: 90 mg/dL (ref 70–99)
Potassium: 3.9 mmol/L (ref 3.5–5.1)
Sodium: 136 mmol/L (ref 135–145)

## 2023-10-16 LAB — CBC
HCT: 23.1 % — ABNORMAL LOW (ref 36.0–46.0)
Hemoglobin: 8 g/dL — ABNORMAL LOW (ref 12.0–15.0)
MCH: 33.8 pg (ref 26.0–34.0)
MCHC: 34.6 g/dL (ref 30.0–36.0)
MCV: 97.5 fL (ref 80.0–100.0)
Platelets: 129 K/uL — ABNORMAL LOW (ref 150–400)
RBC: 2.37 MIL/uL — ABNORMAL LOW (ref 3.87–5.11)
RDW: 20.3 % — ABNORMAL HIGH (ref 11.5–15.5)
WBC: 3.6 K/uL — ABNORMAL LOW (ref 4.0–10.5)
nRBC: 0.6 % — ABNORMAL HIGH (ref 0.0–0.2)

## 2023-10-16 LAB — PHOSPHORUS: Phosphorus: 2.3 mg/dL — ABNORMAL LOW (ref 2.5–4.6)

## 2023-10-16 LAB — GLUCOSE, CAPILLARY
Glucose-Capillary: 98 mg/dL (ref 70–99)
Glucose-Capillary: 120 mg/dL — ABNORMAL HIGH (ref 70–99)
Glucose-Capillary: 122 mg/dL — ABNORMAL HIGH (ref 70–99)
Glucose-Capillary: 120 mg/dL — ABNORMAL HIGH (ref 70–99)

## 2023-10-16 LAB — MAGNESIUM: Magnesium: 1.8 mg/dL (ref 1.7–2.4)

## 2023-10-16 MED ORDER — NALTREXONE HCL 50 MG PO TABS
50.0000 mg | ORAL_TABLET | Freq: Every day | ORAL | 0 refills | Status: DC
Start: 2023-10-16 — End: 2023-10-20

## 2023-10-16 MED ORDER — SODIUM PHOSPHATES 45 MMOLE/15ML IV SOLN
30.0000 mmol | Freq: Once | INTRAVENOUS | Status: AC
  Administered 2023-10-16: 30 mmol via INTRAVENOUS
  Filled 2023-10-16: qty 10

## 2023-10-16 MED ORDER — FOLIC ACID 1 MG PO TABS: 1.0000 mg | ORAL_TABLET | Freq: Every day | ORAL | 2 refills | Status: DC

## 2023-10-16 MED ORDER — ATORVASTATIN CALCIUM 20 MG PO TABS: 20.0000 mg | ORAL_TABLET | Freq: Every day | ORAL | 3 refills | Status: DC

## 2023-10-16 MED ORDER — ALLOPURINOL 100 MG PO TABS
100.0000 mg | ORAL_TABLET | Freq: Every day | ORAL | 0 refills | Status: DC
Start: 2023-10-16 — End: 2023-10-20

## 2023-10-16 MED ORDER — ADULT MULTIVITAMIN W/MINERALS CH
1.0000 | ORAL_TABLET | Freq: Every day | ORAL | Status: DC
  Administered 2023-10-17 – 2023-10-20 (×4): 1 via ORAL
  Filled 2023-10-16 (×4): qty 1

## 2023-10-16 MED ORDER — PANTOPRAZOLE SODIUM 40 MG PO TBEC: 40.0000 mg | DELAYED_RELEASE_TABLET | Freq: Two times a day (BID) | ORAL | 3 refills | Status: DC

## 2023-10-16 MED ORDER — LEVETIRACETAM 750 MG PO TABS: 750.0000 mg | ORAL_TABLET | Freq: Two times a day (BID) | ORAL | 2 refills | Status: DC

## 2023-10-16 MED ORDER — GABAPENTIN 100 MG PO CAPS: 100.0000 mg | ORAL_CAPSULE | Freq: Three times a day (TID) | ORAL | 2 refills | Status: DC

## 2023-10-16 MED ORDER — DULOXETINE HCL 30 MG PO CPEP: 30.0000 mg | ORAL_CAPSULE | Freq: Every day | ORAL | 2 refills | Status: DC

## 2023-10-16 MED ORDER — LEVOTHYROXINE SODIUM 25 MCG PO TABS: 25.0000 ug | ORAL_TABLET | Freq: Every day | ORAL | 3 refills | Status: DC

## 2023-10-16 MED ORDER — THIAMINE HCL 100 MG PO TABS
100.0000 mg | ORAL_TABLET | Freq: Every day | ORAL | 0 refills | Status: DC
Start: 2023-10-16 — End: 2023-10-20

## 2023-10-16 NOTE — Discharge Summary (Incomplete)
 Name: Christina Byrd MRN: [id number] DOB: 1965-04-06 58 y.o. PCP: Harrie Bruckner, DO  Date of Admission: 10/14/2023 11:32 AM Date of Discharge: 10/20/2023 Attending Physician: Dr. MYRTIS Dauphin  Discharge Diagnosis: Active Problems:   Other pancytopenia (HCC)   Malnutrition (HCC)   Hypothyroidism   Wernicke encephalopathy   Alcoholic ketoacidosis   Alcohol abuse   Hypomagnesemia   BMI less than 19,adult    Discharge Medications: Allergies as of 10/20/2023       Reactions   Levaquin  [levofloxacin  In D5w] Rash   Losartan  Potassium Rash   Ace Inhibitors Cough   Cefepime  Rash   09/04/12 pm Patient started to break out with small macules after IV Vanco infusion, then macules increased in size after starting cefepime  infusion.   Chlorhexidine Itching, Rash   Codeine  Nausea Only   Vancomycin  Rash   09/04/12 pm Patient started to break out with small macules after IV Vanco infusion, then macules increased in size after starting cefepime  infusion.        Medication List     STOP taking these medications    potassium chloride  20 MEQ packet Commonly known as: KLOR-CON        TAKE these medications    acetaminophen  325 MG tablet Commonly known as: TYLENOL  Take 2 tablets (650 mg total) by mouth every 6 (six) hours as needed for mild pain (pain score 1-3) or fever (or Fever >/= 101).   allopurinol  100 MG tablet Commonly known as: ZYLOPRIM  Take 1 tablet (100 mg total) by mouth daily. What changed: See the new instructions.   atorvastatin  20 MG tablet Commonly known as: LIPITOR Take 1 tablet (20 mg total) by mouth daily.   cetirizine  10 MG tablet Commonly known as: ZyrTEC  Allergy Take 1 tablet (10 mg total) by mouth daily.   DULoxetine  30 MG capsule Commonly known as: Cymbalta  Take 1 capsule (30 mg total) by mouth daily.   feeding supplement Liqd Take 237 mLs by mouth 3 (three) times daily between meals.   folic acid  1 MG tablet Commonly known as:  FOLVITE  Take 1 tablet (1 mg total) by mouth daily.   gabapentin  100 MG capsule Commonly known as: Neurontin  Take 1 capsule (100 mg total) by mouth 3 (three) times daily.   levETIRAcetam  750 MG tablet Commonly known as: KEPPRA  Take 1 tablet (750 mg total) by mouth 2 (two) times daily. What changed: See the new instructions.   levothyroxine  25 MCG tablet Commonly known as: SYNTHROID  Take 1 tablet (25 mcg total) by mouth daily before breakfast.   magnesium  oxide 400 (240 Mg) MG tablet Commonly known as: MAG-OX Take 1 tablet (400 mg total) by mouth daily.   melatonin 3 MG Tabs tablet Take 1 tablet (3 mg total) by mouth at bedtime.   multivitamin with minerals Tabs tablet Take 1 tablet by mouth daily.   naltrexone  50 MG tablet Commonly known as: DEPADE Take 1 tablet (50 mg total) by mouth daily.   pantoprazole  40 MG tablet Commonly known as: Protonix  Take 1 tablet (40 mg total) by mouth daily. What changed: when to take this   thiamine  100 MG tablet Commonly known as: VITAMIN B1 Take 1 tablet (100 mg total) by mouth daily.         Disposition and follow-up:   [name] was discharged from [facility] in Stable condition.  At the hospital follow up visit please address:  1.  Follow-up:  a. Malnutrition:  patient had decreased glucose, magnesium ,  phosphorus on admission.  Repleted with IV formulations.  At discharge electrolytes were stable.  Encourage patient to eat regularly.  Ensure patient is gaining weight and avoids alcohol use.    b.  AUD: Patient discharged with naltrexone .  Encouraged to stop drinking.  Ensure patient remains away from alcohol use.  Did give her IV thiamine  while inpatient with concerns for Warnicke's encephalopathy.  Follow-up vitamin B1 level as outpatient.  Patient discharged with thiamine  supplementation.  2.  Labs / imaging needed at time of follow-up: BMP, CBC  3.  Pending labs/ test needing follow-up: Vitamin  B1 levels   4.  Medication Changes  1) Started patient back on all home meds as patient was not taking meds at home because she did not refill them             2) Also started patient on naltrexone  to help with alcohol cravings. Patient does want to quit and is amenable to taking this medication.    Follow-up Appointments:  Follow-up Information     Harrie Bruckner, DO. Go on 10/27/2023.   Specialty: Internal Medicine Why: Please go to your appointment on September 4 at 2:15 pm Contact information: [address], [address] [place] [address] [phone]         [facility] Follow up.   Specialty: Rehabilitation Why: Please call and follow up with [facility] regarding needed OUtpatient therapy services. Contact information: [address] [place]  [phone]                Hospital Course by problem list:  Christina Byrd is a 58 y.o. female with a past medical history of alcohol use disorder, previous seizures, history of electrolyte derangements presenting with concerns of unresponsiveness, and found to have electrolyte derangements.   Severe malnutrition with symptomatic hypoglycemia Electrolyte disturbances Hypomagnesemia Hypokalemia Hypophosphatemia Patient has not been eating but 2 Vienna sausages a day, and drinks twice a day every day.  Patient was recently hospitalized, 7/3 - 7/4 for similar concerns.  On the morning of admission, the patient's partner noticed that the patient was unresponsive.  He called EMS, and they checked her sugar.  Her sugar was 28.  They gave her glucagon and dextrose  and she improved.  During hospital course, patient had multiple electrolyte disturbances with decreased magnesium  potassium and phosphorus.  During hospitalization we encouraged her to eat and also ensure that patient had electrolytes replenished.  Her symptomatic  hypoglycemia was likely in the setting of decreased glycogen storage due to malnutrition.  I discharge patient stable electrolytes.  Patient encouraged to eat outpatient.  Alcohol use disorder History of withdrawal seizures Warnicke's encephalopathy Patient has extensive alcohol use disorder, the last time she drank was this morning around 4 AM before going back to bed.  Placed on CIWA protocol.  Patient was placed on Keppra  during the last admission for concern for withdrawal seizures.  Patient had nystagmus on H finger test, likely some component of Wernicke's.  Patient was started on IV thiamine .  Discharged with thiamine  supplementation.  Follow-up thiamine  levels outpatient.  Patient encouraged to stay away from alcohol use.  Discharged with naltrexone .  No acute concern for withdrawal during hospitalization.   Acute kidney injury During hospitalization patient had elevated creatinine upon arrival.  This is likely prerenal.  Patient was volume resuscitated and patient did well.  Creatinine at baseline at discharge.   Chronic anemia Hemoglobin 11.2 on  admission, was 8.2 on discharge in July. The hemoglobin on admission likely due to hemoconcentration in the setting of dehydration.  Hemoglobin at baseline at discharge.   Hypothyroidism  Continue home levothyroxine  25 mcg daily. TSH 1.556 on admission.  No acute concerns during hospitalization.  Patient continued home levothyroxine .   GERD  Patient was continued on home pantoprazole  40 mg twice daily p.o.   Hyperlipidemia Patient was continued on home atorvastatin  20 mg daily   Alcoholic neuropathy  Patient was continued on home gabapentin   and duloxetine  during hospitalization.  No acute concerns during hospitalization.  Patient work with PT/OT  Discharge Subjective: Patient was sitting up in her bed this morning. She was stating that she can go home. She has been eating while she was here in the hospital and denies any alcohol cravings.  She is amenable to naltrexone  to help with alcohol cravings.   Discharge Exam:   BP 128/87 (BP Location: Right Arm)   Pulse 92   Temp 98.2 F (36.8 C)   Resp 17   Ht 5' 6 (1.676 m)   Wt 43.5 kg   LMP 08/23/2011   SpO2 100%   BMI 15.49 kg/m  Constitutional: Sitting up in bed, eating HENT: normocephalic atraumatic Cardiovascular: regular rate and rhythm, no m/r/g Pulmonary/Chest: normal work of breathing on room air, lungs clear to auscultation bilaterally extremities, alert and oriented x 3  Pertinent Labs, Studies, and Procedures:     Latest Ref Rng & Units 10/19/2023    2:36 AM 10/18/2023    4:28 AM 10/17/2023    3:54 AM  CBC  WBC 4.0 - 10.5 K/uL 4.8  4.2  3.7   Hemoglobin 12.0 - 15.0 g/dL 7.8  7.8  7.3   Hematocrit 36.0 - 46.0 % 22.7  22.6  21.2   Platelets 150 - 400 K/uL 156  145  129        Latest Ref Rng & Units 10/19/2023    2:36 AM 10/18/2023    4:28 AM 10/17/2023    3:54 AM  CMP  Glucose 70 - 99 mg/dL 83  81  89   BUN 6 - 20 mg/dL 10  8  6    Creatinine 0.44 - 1.00 mg/dL 9.13  8.98  9.00   Sodium 135 - 145 mmol/L 135  135  137   Potassium 3.5 - 5.1 mmol/L 3.9  4.0  3.9   Chloride 98 - 111 mmol/L 105  106  109   CO2 22 - 32 mmol/L 22  23  22    Calcium  8.9 - 10.3 mg/dL 7.6  7.4  7.1     No results found.   Discharge Instructions: Discharge Instructions     Ambulatory referral to Physical Therapy   Complete by: As directed    Iontophoresis - 4 mg/ml of dexamethasone : No   T.E.N.S. Unit Evaluation and Dispense as Indicated: No   Diet - low sodium heart healthy   Complete by: As directed    Increase activity slowly   Complete by: As directed        Signed: D'Mello, Pattrick Bady, DO 10/20/2023, 12:16 PM   Internal Medicine Resident PGY-1

## 2023-10-16 NOTE — Discharge Instructions (Addendum)
 [name],  It was a pleasure taking care of you at [facility]. You were admitted for low potassium and low magnesium  levels. You also had low sugar levels. We are now discharging your as you have improved while staying here in the hospital.  1) Regarding your low sugar and your low electrolytes, I think this is in the setting of alcohol use.  You are not eating enough and instead you are drinking.  This is causing you to miss meals and have low nutrition.  Eat regular meals.  Avoid alcohol use, I am running your medication called naltrexone .  This will help you with your alcohol cravings.  Please take this daily.  2) Regarding history of seizures, please continue taking your Keppra .  If you avoid taking this medicine, this could put you at risk for seizures.  3) I am not making any other medication changes.  Please continue to eat and drink properly outpatient as this will keep you out of the hospital.  Take all of your other medications as prescribed.  Take care,  Dr. Libby Blanch, DO

## 2023-10-16 NOTE — Progress Notes (Signed)
 Initial Nutrition Assessment  DOCUMENTATION CODES:  Underweight  INTERVENTION:  Multivitamin w/ minerals daily Continue Folic acid  and thiamine  daily Ensure Plus High Protein po TID, each supplement provides 350 kcal and 20 grams of protein Magic cup TID with meals, each supplement provides 290 kcal and 9 grams of protein Encourage good PO intake   NUTRITION DIAGNOSIS:  Increased nutrient needs related to chronic illness as evidenced by estimated needs.  GOAL:  Patient will meet greater than or equal to 90% of their needs  MONITOR:  PO intake, Supplement acceptance, Labs, I & O's  REASON FOR ASSESSMENT:  Consult Assessment of nutrition requirement/status  ASSESSMENT:  58 y.o. female presented to the ED with altered mental status. PMH includes EtOH abuse, hypothyroidism, GERD, CKD III, and HLD. Pt admitted with hypoglycemia due to poor PO intake, EtOH abuse, and AKI.   8/22 - Admitted  RD working remotely at time of assessment. Pt known to RD team from previous admissions. Per H&P, pt was eating very little and consuming alcohol prior to admission. Since admission, per MD notes pt PO intake has improved.  No meal intakes have been recorded this admission. Pt with Ensure currently ordered, per meds history pt has refused all offered. RD to order magic cup on all meal trays to assist with increased needs.   Pt with a history of malnutrition and reports of significant weight loss per previous RD notes. Current weight appears stated, although is down 2.3 kg from previous measured weight. Suspect pt still meets criteria for malnutrition, although unable to diagnosis at this time due to inability to complete physical exam.   Nutrition Related Medications: Folic acid , Protonix , IV Thiamine  Labs: Sodium 136, Potassium 3.9, BUN 5, Creatinine 1.07, Phosphorus 2.3, Magnesium  1.8, Vitamin B12 549  CBG: 98-224 mg/dL x 24 hrs    NUTRITION - FOCUSED PHYSICAL EXAM: Deferred to  follow-up.  Diet Order:   Diet Order             Diet Carb Modified Fluid consistency: Thin; Room service appropriate? Yes  Diet effective now                  EDUCATION NEEDS:  Not appropriate for education at this time  Skin:  Skin Assessment: Reviewed RN Assessment  Last BM:  8/22  Height:  Ht Readings from Last 1 Encounters:  10/14/23 5' 6 (1.676 m)   Weight:  Wt Readings from Last 1 Encounters:  10/14/23 43.5 kg   Ideal Body Weight:  59.1 kg  BMI:  Body mass index is 15.49 kg/m.  Estimated Nutritional Needs:  Kcal:  1500-1700 Protein:  75-95 grams Fluid:  >/= 1.5 L   Nestora Glatter RD, LDN Clinical Dietitian

## 2023-10-16 NOTE — Progress Notes (Signed)
 HD#0 Subjective:   Summary: 58 year old female with a past medical history of alcohol use disorder, previous seizures, history of electrolyte derangements presenting with concerns of unresponsiveness, and found to have electrolyte derangements.  Overnight Events: No acute concerns overnight  Patient sitting up and eating in bed this morning.  She states she feels well.  She does feel unsteady on her feet.  She otherwise states she is feeling well.  She states she is ready to go home.  Objective:  Vital signs in last 24 hours: Vitals:   10/15/23 1701 10/15/23 2026 10/15/23 2337 10/16/23 0302  BP: 117/88 122/84 (!) 122/93 111/76  Pulse: 86 81 94 83  Resp: 16 16 17 17   Temp:  98 F (36.7 C) 98.4 F (36.9 C) 98.4 F (36.9 C)  TempSrc:      SpO2: 99% 100% 99% 100%  Weight:      Height:       Supplemental O2: Room Air SpO2: 100 %   Physical Exam:  Constitutional: Sitting up in bed, eating HENT: normocephalic atraumatic Cardiovascular: regular rate and rhythm, no m/r/g Pulmonary/Chest: normal work of breathing on room air, lungs clear to auscultation bilaterally Neuro: Some tremors appreciated to bilateral upper extremities, alert and oriented x 3  Filed Weights   10/14/23 1143  Weight: 43.5 kg     Intake/Output Summary (Last 24 hours) at 10/16/2023 0559 Last data filed at 10/15/2023 1654 Gross per 24 hour  Intake 1557.6 ml  Output --  Net 1557.6 ml   Net IO Since Admission: 2,608.18 mL [10/16/23 0559]  Pertinent Labs:    Latest Ref Rng & Units 10/16/2023    2:58 AM 10/15/2023   10:10 AM 10/14/2023    3:33 PM  CBC  WBC 4.0 - 10.5 K/uL 3.6  4.3    Hemoglobin 12.0 - 15.0 g/dL 8.0  8.1  88.7   Hematocrit 36.0 - 46.0 % 23.1  23.6  33.0   Platelets 150 - 400 K/uL 129  131         Latest Ref Rng & Units 10/16/2023    2:58 AM 10/15/2023   10:10 AM 10/14/2023   10:52 PM  CMP  Glucose 70 - 99 mg/dL 90  816  97   BUN 6 - 20 mg/dL 5  9  10    Creatinine 0.44 - 1.00  mg/dL 8.92  8.91  9.05   Sodium 135 - 145 mmol/L 136  137  138   Potassium 3.5 - 5.1 mmol/L 3.9  3.8  3.8   Chloride 98 - 111 mmol/L 107  109  107   CO2 22 - 32 mmol/L 20  19  21    Calcium  8.9 - 10.3 mg/dL 7.3  7.1  6.9     Imaging: No results found.  Assessment/Plan:   Principal Problem:   Hypokalemia Active Problems:   Malnutrition (HCC)   Hypothyroidism   Wernicke encephalopathy   Alcoholic ketoacidosis   Alcohol abuse   Hypomagnesemia   Seizures (HCC)   Hypernatremia   BMI less than 19,adult   Patient Summary: Christina Byrd is a 58 y.o. female with a past medical history of alcohol use disorder, previous seizures, history of electrolyte derangements presenting with concerns of unresponsiveness, and found to have electrolyte derangements.  #Severe malnutrition #Hypokalemia #Hypomagnesemia #Hypophosphatemia Patient is eating better now.  Sugars are measuring better.  Her creatinine is back to normal.  She did have slightly decreased phosphorus at 2.3 today.  Encouraged her  to eat well, stay hydrated and stay away from alcohol.  She states that she will do this from now on. - Get patient up and movement today with PT - Potentially discharge - Patient to stop drinking alcohol and eat more  #Alcohol use disorder #History of seizures #Wernicke's encephalopathy Treating with high-dose thiamine  for now.  Encourage patient to stop alcohol use.  PT worked with patient today.  Does not seem like patient is withdrawing at this time.  Will continue with Keppra .  She has long history of seizures, and has been on Keppra  for quite some time.  Will continue with this. - Continue high-dose thiamine  - Continue Keppra  750 mg twice daily - Start naltrexone  on discharge - PT to work with patient today  #Acute kidney injury, resolved Creatinine at 1.07 today.  No acute concerns. - Monitor renal function  #Normocytic anemia #Thrombocytopenia No acute signs of bleeding.  Patient  is at hemoglobin of 8.  Platelets 129. -Continue to monitor for signs of bleeding -Monitor platelet count  #Hypothyroidism No acute concerns. - Continue home levothyroxine  25 mcg daily  #GERD  No acute concerns - Continue pantoprazole  40 mg twice daily  #Hyperlipidemia No acute concerns - Continue atorvastatin  20 mg daily  #Alcoholic neuropathy - Continue gabapentin  100 mg 3 times daily - Continue duloxetine  30 mg daily  Diet: Carb-Modified IVF: None,None VTE: Enoxaparin  Code: Full PT/OT recs: Pending. Family Update: Boyfriend updated at bedside    Dispo: Anticipated discharge to Home in 1 days pending PT Evaluation.   Libby Blanch DO Internal Medicine Resident PGY-3 Please contact the on call pager after 5 pm and on weekends at [phone]

## 2023-10-16 NOTE — Plan of Care (Signed)
   Problem: Education: Goal: Knowledge of General Education information will improve Description: Including pain rating scale, medication(s)/side effects and non-pharmacologic comfort measures Outcome: Progressing   Problem: Clinical Measurements: Goal: Will remain free from infection Outcome: Progressing Goal: Respiratory complications will improve Outcome: Progressing

## 2023-10-16 NOTE — Evaluation (Signed)
 Physical Therapy Evaluation Patient Details Name: Christina Byrd MRN: [id number] DOB: 08-23-1965 Today's Date: 10/16/2023  History of Present Illness  58 y.o. female presents to [facility] 10/14/23 with AMS. Admitted with severe malnutrition w/ hypoglycemia and electrolyte disturbances. CIWA precautions. PMHx: CKD 3b, OA, alcohol abuse, alcoholic pancreatitis, seizure disorder, Wernicke's encephalopathy.   Clinical Impression  Pt in bed upon arrival and agreeable to PT eval. PTA, pt would ambulate with no AD with husband assisting as needed. Reports that RW would not fit in her house due to clutter. Pt has had at least 5 falls in the past 6 months due to being off balance. MinA to stand and CGA to ambulate in the room with use of IV pole with slightly unsteady gait pattern. Pt reports feeling uncomfortable returning home and would like to get further rehab to prevent future falls. Recommending <3hrs post acute rehab to reduce hospital re-admission risk and decrease falls risk. Pt would benefit from acute skilled PT with current functional limitations listed below (see PT Problem List). Acute PT to follow.         If plan is discharge home, recommend the following: A little help with walking and/or transfers;A little help with bathing/dressing/bathroom;Assist for transportation;Help with stairs or ramp for entrance   Can travel by private vehicle   Yes    Equipment Recommendations Other (comment) (TBD)     Functional Status Assessment Patient has had a recent decline in their functional status and demonstrates the ability to make significant improvements in function in a reasonable and predictable amount of time.     Precautions / Restrictions Precautions Precautions: Fall Restrictions Weight Bearing Restrictions Per Provider Order: No      Mobility  Bed Mobility Overal bed mobility: Modified Independent    General bed mobility comments: hob elevated and increased time and effort     Transfers Overall transfer level: Needs assistance Equipment used: None Transfers: Sit to/from Stand Sit to Stand: Contact guard assist      General transfer comment: CGA for safety    Ambulation/Gait Ambulation/Gait assistance: Contact guard assist Gait Distance (Feet): 20 Feet Assistive device: IV Pole Gait Pattern/deviations: Step-through pattern, Decreased stride length, Trunk flexed Gait velocity: decr     General Gait Details: slightly forward flexed posture with slow and shuffling steps. Reliant on UE support from IV pole with pt declining use of RW     Balance Overall balance assessment: Needs assistance Sitting-balance support: No upper extremity supported, Feet supported Sitting balance-Leahy Scale: Fair Sitting balance - Comments: static sitting intact   Standing balance support: Bilateral upper extremity supported, During functional activity, Reliant on assistive device for balance Standing balance-Leahy Scale: Poor Standing balance comment: reliant on UE support        Pertinent Vitals/Pain Pain Assessment Pain Assessment: No/denies pain    Home Living Family/patient expects to be discharged to:: Private residence Living Arrangements: Spouse/significant other Available Help at Discharge: Family;Available PRN/intermittently Type of Home: House Home Access: Ramped entrance    Home Layout: One level Home Equipment: Agricultural consultant (2 wheels);BSC/3in1;Shower seat Additional Comments: pt reports her house is too cluttered for a RW to fit inside    Prior Function Prior Level of Function : Independent/Modified Independent;History of Falls (last six months)    Mobility Comments: pt reports independent without AD, reports her husband assists at times with she is dizzy. At least 5 falls due to losing balance ADLs Comments: she reports she is independent with self-care  Extremity/Trunk Assessment   Upper Extremity Assessment Upper Extremity Assessment:  Defer to OT evaluation    Lower Extremity Assessment Lower Extremity Assessment: Generalized weakness    Cervical / Trunk Assessment Cervical / Trunk Assessment: Normal  Communication   Communication Communication: Impaired Factors Affecting Communication: Reduced clarity of speech    Cognition Arousal: Alert Behavior During Therapy: WFL for tasks assessed/performed   PT - Cognitive impairments: No family/caregiver present to determine baseline, Safety/Judgement    Following commands: Intact       Cueing Cueing Techniques: Verbal cues     General Comments General comments (skin integrity, edema, etc.): VSS on RA     PT Assessment Patient needs continued PT services  PT Problem List Decreased strength;Decreased activity tolerance;Decreased balance;Decreased mobility       PT Treatment Interventions DME instruction;Gait training;Stair training;Functional mobility training;Therapeutic activities;Therapeutic exercise;Balance training;Neuromuscular re-education;Patient/family education    PT Goals (Current goals can be found in the Care Plan section)  Acute Rehab PT Goals Patient Stated Goal: to go to rehab and have less falls PT Goal Formulation: With patient Time For Goal Achievement: 10/30/23 Potential to Achieve Goals: Good    Frequency Min 2X/week        AM-PAC PT 6 Clicks Mobility  Outcome Measure Help needed turning from your back to your side while in a flat bed without using bedrails?: None Help needed moving from lying on your back to sitting on the side of a flat bed without using bedrails?: None Help needed moving to and from a bed to a chair (including a wheelchair)?: A Little Help needed standing up from a chair using your arms (e.g., wheelchair or bedside chair)?: A Little Help needed to walk in hospital room?: A Little Help needed climbing 3-5 steps with a railing? : A Lot 6 Click Score: 19    End of Session Equipment Utilized During  Treatment: Gait belt Activity Tolerance: Patient tolerated treatment well Patient left: in bed;with call bell/phone within reach;with bed alarm set Nurse Communication: Mobility status PT Visit Diagnosis: Other abnormalities of gait and mobility (R26.89);Muscle weakness (generalized) (M62.81);History of falling (Z91.81)    Time: 8967-8954 PT Time Calculation (min) (ACUTE ONLY): 13 min   Charges:   PT Evaluation $PT Eval Low Complexity: 1 Low   PT General Charges $$ ACUTE PT VISIT: 1 Visit       Christina Byrd, PT, DPT Secure Chat Preferred  Rehab Office [phone]   Christina Byrd 10/16/2023, 11:18 AM

## 2023-10-17 ENCOUNTER — Ambulatory Visit: Attending: Cardiovascular Disease | Admitting: Cardiovascular Disease

## 2023-10-17 DIAGNOSIS — E785 Hyperlipidemia, unspecified: Secondary | ICD-10-CM | POA: Diagnosis not present

## 2023-10-17 DIAGNOSIS — K219 Gastro-esophageal reflux disease without esophagitis: Secondary | ICD-10-CM | POA: Diagnosis not present

## 2023-10-17 DIAGNOSIS — D649 Anemia, unspecified: Secondary | ICD-10-CM | POA: Diagnosis not present

## 2023-10-17 DIAGNOSIS — G621 Alcoholic polyneuropathy: Secondary | ICD-10-CM | POA: Diagnosis not present

## 2023-10-17 DIAGNOSIS — E876 Hypokalemia: Secondary | ICD-10-CM | POA: Diagnosis not present

## 2023-10-17 DIAGNOSIS — D61818 Other pancytopenia: Secondary | ICD-10-CM | POA: Diagnosis not present

## 2023-10-17 DIAGNOSIS — E039 Hypothyroidism, unspecified: Secondary | ICD-10-CM | POA: Diagnosis not present

## 2023-10-17 DIAGNOSIS — F1010 Alcohol abuse, uncomplicated: Secondary | ICD-10-CM | POA: Diagnosis not present

## 2023-10-17 LAB — CBC WITH DIFFERENTIAL/PLATELET
Abs Immature Granulocytes: 0.01 K/uL (ref 0.00–0.07)
Basophils Absolute: 0 K/uL (ref 0.0–0.1)
Basophils Relative: 0 %
Eosinophils Absolute: 0 K/uL (ref 0.0–0.5)
Eosinophils Relative: 0 %
HCT: 21.3 % — ABNORMAL LOW (ref 36.0–46.0)
Hemoglobin: 7.3 g/dL — ABNORMAL LOW (ref 12.0–15.0)
Immature Granulocytes: 0 %
Lymphocytes Relative: 46 %
Lymphs Abs: 1.6 K/uL (ref 0.7–4.0)
MCH: 33.5 pg (ref 26.0–34.0)
MCHC: 34.3 g/dL (ref 30.0–36.0)
MCV: 97.7 fL (ref 80.0–100.0)
Monocytes Absolute: 0.5 K/uL (ref 0.1–1.0)
Monocytes Relative: 14 %
Neutro Abs: 1.5 K/uL — ABNORMAL LOW (ref 1.7–7.7)
Neutrophils Relative %: 40 %
Platelets: 128 K/uL — ABNORMAL LOW (ref 150–400)
RBC: 2.18 MIL/uL — ABNORMAL LOW (ref 3.87–5.11)
RDW: 20.5 % — ABNORMAL HIGH (ref 11.5–15.5)
WBC: 3.6 K/uL — ABNORMAL LOW (ref 4.0–10.5)
nRBC: 0 % (ref 0.0–0.2)

## 2023-10-17 LAB — BASIC METABOLIC PANEL WITH GFR
Anion gap: 6 (ref 5–15)
BUN: 6 mg/dL (ref 6–20)
CO2: 22 mmol/L (ref 22–32)
Calcium: 7.1 mg/dL — ABNORMAL LOW (ref 8.9–10.3)
Chloride: 109 mmol/L (ref 98–111)
Creatinine, Ser: 0.99 mg/dL (ref 0.44–1.00)
GFR, Estimated: >60 mL/min (ref >60)
Glucose, Bld: 89 mg/dL (ref 70–99)
Potassium: 3.9 mmol/L (ref 3.5–5.1)
Sodium: 137 mmol/L (ref 135–145)

## 2023-10-17 LAB — PHOSPHORUS: Phosphorus: 4.4 mg/dL (ref 2.5–4.6)

## 2023-10-17 LAB — HEPATIC FUNCTION PANEL
ALT: 21 U/L (ref 0–44)
AST: 68 U/L — ABNORMAL HIGH (ref 15–41)
Albumin: 1.6 g/dL — ABNORMAL LOW (ref 3.5–5.0)
Alkaline Phosphatase: 158 U/L — ABNORMAL HIGH (ref 38–126)
Bilirubin, Direct: <0.1 mg/dL (ref 0.0–0.2)
Total Bilirubin: 0.2 mg/dL (ref 0.0–1.2)
Total Protein: 4.5 g/dL — ABNORMAL LOW (ref 6.5–8.1)

## 2023-10-17 LAB — CBC
HCT: 21.2 % — ABNORMAL LOW (ref 36.0–46.0)
Hemoglobin: 7.3 g/dL — ABNORMAL LOW (ref 12.0–15.0)
MCH: 33.2 pg (ref 26.0–34.0)
MCHC: 34.4 g/dL (ref 30.0–36.0)
MCV: 96.4 fL (ref 80.0–100.0)
Platelets: 129 K/uL — ABNORMAL LOW (ref 150–400)
RBC: 2.2 MIL/uL — ABNORMAL LOW (ref 3.87–5.11)
RDW: 20.3 % — ABNORMAL HIGH (ref 11.5–15.5)
WBC: 3.7 K/uL — ABNORMAL LOW (ref 4.0–10.5)
nRBC: 0 % (ref 0.0–0.2)

## 2023-10-17 LAB — GLUCOSE, CAPILLARY
Glucose-Capillary: 85 mg/dL (ref 70–99)
Glucose-Capillary: 110 mg/dL — ABNORMAL HIGH (ref 70–99)
Glucose-Capillary: 119 mg/dL — ABNORMAL HIGH (ref 70–99)
Glucose-Capillary: 107 mg/dL — ABNORMAL HIGH (ref 70–99)

## 2023-10-17 LAB — TECHNOLOGIST SMEAR REVIEW: Plt Morphology: NORMAL

## 2023-10-17 LAB — MAGNESIUM: Magnesium: 1.3 mg/dL — ABNORMAL LOW (ref 1.7–2.4)

## 2023-10-17 LAB — VITAMIN B1: Vitamin B1 (Thiamine): 156.7 nmol/L (ref 66.5–200.0)

## 2023-10-17 MED ORDER — MAGNESIUM SULFATE 2 GM/50ML IV SOLN
2.0000 g | Freq: Once | INTRAVENOUS | Status: AC
  Administered 2023-10-17: 2 g via INTRAVENOUS
  Filled 2023-10-17: qty 50

## 2023-10-17 MED ORDER — THIAMINE MONONITRATE 100 MG PO TABS
100.0000 mg | ORAL_TABLET | Freq: Every day | ORAL | Status: DC
  Administered 2023-10-17 – 2023-10-20 (×4): 100 mg via ORAL
  Filled 2023-10-17 (×4): qty 1

## 2023-10-17 NOTE — NC FL2 (Signed)
 [place]  MEDICAID FL2 LEVEL OF CARE FORM     IDENTIFICATION  Patient Name: Christina Byrd Birthdate: 1965-11-24 Sex: female Admission Date (Current Location): 10/14/2023  [place] and [place] Number:  [place] [id number] Facility and Address:  [facility], [address], [place], [place] [address]      Provider Number: [id number]  Attending Physician Name and Address:  Shawn Sick, MD  Relative Name and Phone Number:  Moore,Clayton Significant other [phone]    Current Level of Care: Hospital Recommended Level of Care: Skilled Nursing Facility Prior Approval Number:    Date Approved/Denied:   PASRR Number: [id number]  Discharge Plan: SNF    Current Diagnoses: Patient Active Problem List   Diagnosis Date Noted   BMI less than 19,adult 10/15/2023   Protein-calorie malnutrition, severe 10/15/2023   History of substance use disorder 08/26/2023   Thrombocytopenia (HCC) 08/26/2023   Chronic anemia 08/26/2023   Hypomagnesemia 08/16/2023   Alcohol abuse 07/25/2023   Alcoholic ketoacidosis 07/20/2023   Wernicke encephalopathy 07/16/2023   Acute pancreatitis 07/05/2023   Encephalopathy 07/04/2023   Elevated liver enzymes 07/04/2023   Falls frequently 07/04/2023   Abdominal pain 07/04/2023   Weakness 07/04/2023   Metabolic acidosis 07/04/2023   Physical deconditioning 06/22/2023   History of non anemic vitamin B12 deficiency 06/20/2023   Dehydration 06/18/2023   Aspiration pneumonia of left lower lobe (HCC) 06/18/2023   AKI (acute kidney injury) (HCC) 06/13/2023   Aortic atherosclerosis (HCC) 05/26/2023   Nausea & vomiting 05/03/2023   Alcoholic peripheral neuropathy (HCC) 05/03/2023   Breakthrough seizure (HCC) 01/23/2023   Localized skin desquamation 01/06/2023   Hypothyroidism 01/06/2023   Vitamin D  deficiency 01/06/2023   CKD (chronic kidney disease) stage 3, GFR 30-59 ml/min (HCC) 01/06/2023   Abnormal LFTs 12/28/2022    Chronic diarrhea 12/28/2022   Heme positive stool 12/28/2022   Malnutrition (HCC) 12/27/2022   Electrolyte abnormality 12/26/2022   Hypocalcemia 11/19/2022   Hypoalbuminemia due to protein-calorie malnutrition (HCC) 11/19/2022   Hyperlipidemia 11/19/2022   Elevated lipase 11/19/2022   Osteoarthritis 09/02/2022   History of noncompliance with medical treatment 07/27/2022   Hepatic steatosis 07/27/2022   Acute pain of right knee 07/08/2022   Transaminitis 07/08/2022   Other pancytopenia (HCC) 07/08/2022   Alcohol withdrawal syndrome without complication (HCC) 07/07/2022   Duodenal ulcer hemorrhage 06/03/2022   AVM (arteriovenous malformation) of stomach, 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   Anemia due to chronic blood loss 06/03/2022   Dysphagia 03/28/2020   Cervicalgia 03/28/2020   Hot flashes 01/30/2020   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   Depression 07/08/2016   Seizure disorder (HCC) 07/08/2016   Cocaine use    Gout of left ankle 10/09/2015   Left hip pain 09/02/2015   Colonoscopy refused 09/02/2015   ETOH abuse 09/02/2015   Hyperuricemia 05/27/2015   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   Left shoulder pain 04/25/2015   Allergic rhinitis 05/13/2014   GERD without esophagitis 04/01/2014   Osteoarthritis of left knee 12/11/2013    Orientation RESPIRATION BLADDER Height & Weight     Self, Time, Situation, Place  Normal Continent Weight: 96 lb (43.5 kg) Height:  5' 6 (167.6 cm)  BEHAVIORAL SYMPTOMS/MOOD NEUROLOGICAL BOWEL NUTRITION STATUS    Convulsions/Seizures   Diet (see DC summary)  AMBULATORY STATUS COMMUNICATION OF NEEDS Skin   Limited Assist Verbally Normal  Personal Care Assistance Level of Assistance  Bathing, Feeding, Dressing Bathing Assistance: Limited assistance Feeding assistance: Limited assistance Dressing Assistance: Limited assistance     Functional Limitations Info  Sight,  Hearing, Speech Sight Info: Adequate Hearing Info: Adequate Speech Info: Adequate    SPECIAL CARE FACTORS FREQUENCY  PT (By licensed PT), OT (By licensed OT)     PT Frequency: 5x week OT Frequency: 5x week            Contractures Contractures Info: Not present    Additional Factors Info  Code Status, Allergies Code Status Info: full Allergies Info: Levaquin  (Levofloxacin  In D5w), Losartan  Potassium, Ace Inhibitors, Cefepime , Chlorhexidine, Codeine , Vancomycin            Current Medications (10/17/2023):  This is the current hospital active medication list Current Facility-Administered Medications  Medication Dose Route Frequency Provider Last Rate Last Admin   acetaminophen  (TYLENOL ) tablet 650 mg  650 mg Oral Q6H PRN Tobie Gaines, DO       Or   acetaminophen  (TYLENOL ) suppository 650 mg  650 mg Rectal Q6H PRN Tobie Gaines, DO       atorvastatin  (LIPITOR) tablet 20 mg  20 mg Oral Daily Tobie Gaines, DO   20 mg at 10/17/23 9182   DULoxetine  (CYMBALTA ) DR capsule 30 mg  30 mg Oral Daily Tobie Gaines, DO   30 mg at 10/17/23 9182   enoxaparin  (LOVENOX ) injection 30 mg  30 mg Subcutaneous Q24H Tobie Gaines, DO   30 mg at 10/17/23 9182   feeding supplement (ENSURE ENLIVE / ENSURE PLUS) liquid 237 mL  237 mL Oral TID BM Tobie Gaines, DO   237 mL at 10/17/23 1409   folic acid  (FOLVITE ) tablet 1 mg  1 mg Oral Daily Tobie Gaines, DO   1 mg at 10/17/23 9182   gabapentin  (NEURONTIN ) capsule 100 mg  100 mg Oral TID Tobie Gaines, DO   100 mg at 10/17/23 9182   levETIRAcetam  (KEPPRA ) tablet 750 mg  750 mg Oral BID Patel, Amar, DO   750 mg at 10/17/23 9182   levothyroxine  (SYNTHROID ) tablet 25 mcg  25 mcg Oral QAC breakfast Tobie Gaines, DO   25 mcg at 10/17/23 9460   LORazepam  (ATIVAN ) tablet 0-4 mg  0-4 mg Oral Q12H Tobie Gaines, DO       multivitamin with minerals tablet 1 tablet  1 tablet Oral Daily Rosan Dayton BROCKS, DO   1 tablet at 10/17/23 9182   pantoprazole  (PROTONIX ) EC tablet 40 mg  40 mg  Oral BID Patel, Amar, DO   40 mg at 10/17/23 9182   senna-docusate (Senokot-S) tablet 1 tablet  1 tablet Oral QHS PRN Tobie Gaines, DO       thiamine  (VITAMIN B1) tablet 100 mg  100 mg Oral Daily D'Mello, Rosalyn, DO   100 mg at 10/17/23 9065     Discharge Medications: Please see discharge summary for a list of discharge medications.  Relevant Imaging Results:  Relevant Lab Results:   Additional Information [id number]  Bridget Cordella Simmonds, LCSW

## 2023-10-17 NOTE — Telephone Encounter (Signed)
 Rtn call and no answer. The patient is currently still admitted to the hospital under the [facility] team.  Copied from CRM [id number]. Topic: General - Other >> Oct 14, 2023  1:15 PM Farrel B wrote: Reason for CRM: [phone] call came in  from patient's spouse, Mr. [name] Ada, calling to advise the pcp that the patient is currently admitted to the hospital. He stated patient is requesting a call back at the number listed above.

## 2023-10-17 NOTE — Progress Notes (Signed)
 HD#1 Subjective:   Summary: 58 year old female with a past medical history of alcohol use disorder, previous seizures, history of electrolyte derangements presenting with concerns of unresponsiveness, and found to have electrolyte derangements.  Overnight Events: No acute concerns overnight  Patient was at bedside with her partner this morning. She states that she was able to eat well yesterday and has more of an appetite today. She states that she feels that she is able to quit drinking and is ok with taking some medication to be able to help her on this journey. She states that she is amenable to going to SNF to help her with her balance. No other complaints at this time.  Objective:  Vital signs in last 24 hours: Vitals:   10/16/23 1651 10/16/23 1931 10/16/23 2344 10/17/23 0347  BP: 131/87 109/89 111/80 130/87  Pulse: 100 93 97 82  Resp: 18 16  16   Temp: 97.6 F (36.4 C) 99.3 F (37.4 C) 98.5 F (36.9 C) 98 F (36.7 C)  TempSrc:   Oral Oral  SpO2: 100% 100% 97% 100%  Weight:      Height:       Supplemental O2: Room Air SpO2: 100 %   Physical Exam:  Constitutional: Sitting up in bed, eating HENT: normocephalic atraumatic Cardiovascular: regular rate and rhythm, no m/r/g Pulmonary/Chest: normal work of breathing on room air, lungs clear to auscultation bilaterally Neuro: Did not note any tremors today, alert and oriented x 3  Filed Weights   10/14/23 1143  Weight: 43.5 kg     Intake/Output Summary (Last 24 hours) at 10/17/2023 0654 Last data filed at 10/16/2023 1456 Gross per 24 hour  Intake 364.96 ml  Output --  Net 364.96 ml   Net IO Since Admission: 2,973.14 mL [10/17/23 0654]  Pertinent Labs:    Latest Ref Rng & Units 10/17/2023    3:54 AM 10/16/2023    2:58 AM 10/15/2023   10:10 AM  CBC  WBC 4.0 - 10.5 K/uL 3.7  3.6  4.3   Hemoglobin 12.0 - 15.0 g/dL 7.3  8.0  8.1   Hematocrit 36.0 - 46.0 % 21.2  23.1  23.6   Platelets 150 - 400 K/uL 129  129  131         Latest Ref Rng & Units 10/17/2023    3:54 AM 10/16/2023    2:58 AM 10/15/2023   10:10 AM  CMP  Glucose 70 - 99 mg/dL 89  90  816   BUN 6 - 20 mg/dL 6  5  9    Creatinine 0.44 - 1.00 mg/dL 9.00  8.92  8.91   Sodium 135 - 145 mmol/L 137  136  137   Potassium 3.5 - 5.1 mmol/L 3.9  3.9  3.8   Chloride 98 - 111 mmol/L 109  107  109   CO2 22 - 32 mmol/L 22  20  19    Calcium  8.9 - 10.3 mg/dL 7.1  7.3  7.1     Imaging: No results found.  Assessment/Plan:   Principal Problem:   Hypokalemia Active Problems:   Malnutrition (HCC)   Hypothyroidism   Wernicke encephalopathy   Alcoholic ketoacidosis   Alcohol abuse   Hypomagnesemia   Seizures (HCC)   Hypernatremia   BMI less than 19,adult   Patient Summary: Christina Byrd is a 58 y.o. female with a past medical history of alcohol use disorder, previous seizures, history of electrolyte derangements presenting with concerns of unresponsiveness, and found  to have electrolyte derangements and hypoglycemia. She is now medically ready for discharge and is pending SNF placement.   #Severe malnutrition #Hypokalemia #Hypomagnesemia #Hypophosphatemia Patient is eating better now.  Sugars are measuring better.  Her creatinine is back to normal. Monitoring electrolytes in the setting of refeeding syndrome. Magnesium  was 1.3 this morning. Repleted. Encouraged her to eat well, stay hydrated and stay away from alcohol.  She states that she will do this from now on. - PT recommended SNF -Check K, Mg, Phos tomorrow  - Patient is interested in quitting drinking. She was encouraged to continue to eat more.   #Alcohol use disorder #History of seizures #Wernicke's encephalopathy Transitioned to oral thiamine  today; no acute concern for Wernicke's encephalopathy at this time.  Encourage patient to stop alcohol use.  Patient is over the DT time period at this time, no concern for withdrawal right now.  Patient has history of seizures so will  continue on home Keppra .   - Continue 100mg  thiamine  PO - Continue Keppra  750 mg twice daily - Start naltrexone  on discharge - PT recommending SNF   #Pancytopenia  No acute signs of bleeding.  Patient is at hemoglobin of 7.3 today.  Platelets stable at 129. B12 549 and borderline low at 5.8. Ferritin was 1,284 in May 2025 with iron at 119 and TIBC at 130. Fits anemia of chronic disease picture. Smear was ordered and showed normal platelet morphology and WBC morphology, but target cell morphology for RBC. Smear was ordered to rule out any signs of DIC which could be sign of AML, myelofibrosis or dysplastic syndrome or any other hemolytic anemias.Patient's findings can be consistent with bone marrow toxicity from alcohol use, but does not seem patient has had pancytopenia before.  - continue folic acid  supplementation  -Continue to monitor for signs of bleeding -Monitor platelet count  #Acute kidney injury, resolved Creatinine at .99. Back at baseline. Likely due to decreased PO intake.   #Hypothyroidism No acute concerns. - Continue home levothyroxine  25 mcg daily  #GERD  No acute concerns - Continue pantoprazole  40 mg twice daily  #Hyperlipidemia No acute concerns - Continue atorvastatin  20 mg daily  #Alcoholic neuropathy - Continue gabapentin  100 mg 3 times daily - Continue duloxetine  30 mg daily  Diet: Carb-Modified IVF: None,None VTE: Enoxaparin  Code: Full PT/OT recs: SNF Family Update: Boyfriend updated at bedside    Dispo: Anticipated discharge to SNF pending placement. Patient is medically ready for discharge.   Demarion Pondexter D'Mello DO  Internal Medicine Resident PGY-1 Please contact the on call pager after 5 pm and on weekends at [phone]

## 2023-10-17 NOTE — Plan of Care (Signed)

## 2023-10-17 NOTE — Progress Notes (Signed)
 Mobility Specialist: Progress Note   10/17/23 1500  Mobility  Activity Ambulated with assistance  Level of Assistance Contact guard assist, steadying assist  Assistive Device Other (Comment) (HHA + handrails)  Distance Ambulated (ft) 60 ft  Activity Response Tolerated well  Mobility Referral Yes  Mobility visit 1 Mobility  Mobility Specialist Start Time (ACUTE ONLY) 1005  Mobility Specialist Stop Time (ACUTE ONLY) 1015  Mobility Specialist Time Calculation (min) (ACUTE ONLY) 10 min    Pt received in bed, agreeable to mobility session. MinG for ambulation for steadying assist with frequent use of hallways handrails. C/o of bil leg weakness (R>L) and R knee pain feeling like it's going to buckle, no occurrence of buckling during this session. Also c/o R knee swelling. Distance limited to onset of LE pain. Returned to room without fault. Left in bed with all needs met, call bell in reach.  Ileana Lute Mobility Specialist Please contact via SecureChat or Rehab office at [phone]

## 2023-10-17 NOTE — TOC Initial Note (Signed)
 Transition of Care [facility]) - Initial/Assessment Note    Patient Details  Name: Christina Byrd MRN: [id number] Date of Birth: 1965/09/17  Transition of [facility]) CM/SW Contact:    Bridget Cordella Simmonds, LCSW Phone Number: 10/17/2023, 3:20 PM  Clinical Narrative:  CSW met with pt regarding PT recommendation for SNF.  Pt is agreeable to this, permission given to send out referral in the hub, no facility choice indicated.  Pt from home with sig other, Clayton.  Permission given to speak with him and with son Elspeth.  No current home services.  Referral sent out in hub for SNF.                  Expected Discharge Plan: Skilled Nursing Facility Barriers to Discharge: Continued Medical Work up, SNF Pending bed offer   Patient Goals and CMS Choice Patient states their goals for this hospitalization and ongoing recovery are:: play with my dogs, work          Expected Discharge Plan and Services In-house Referral: Clinical Social Work   Post Acute Care Choice: Skilled Nursing Facility Living arrangements for the past 2 months: Single Family Home                                      Prior Living Arrangements/Services Living arrangements for the past 2 months: Single Family Home Lives with:: Significant Other Reta) Patient language and need for interpreter reviewed:: Yes Do you feel safe going back to the place where you live?: Yes      Need for Family Participation in Patient Care: Yes (Comment) Care giver support system in place?: Yes (comment) Current home services: Other (comment) (none) Criminal Activity/Legal Involvement Pertinent to Current Situation/Hospitalization: No - Comment as needed  Activities of Daily Living   ADL Screening (condition at time of admission) Independently performs ADLs?: Yes (appropriate for developmental age) Is the patient deaf or have difficulty hearing?: No Does the patient have difficulty seeing, even when wearing  glasses/contacts?: No Does the patient have difficulty concentrating, remembering, or making decisions?: No  Permission Sought/Granted Permission sought to share information with : Family Supports Permission granted to share information with : Yes, Verbal Permission Granted  Share Information with NAME: Clayton-sig other, Art gallery manager granted to share info w AGENCY: SNF        Emotional Assessment Appearance:: Appears older than stated age Attitude/Demeanor/Rapport: Engaged Affect (typically observed): Appropriate, Pleasant Orientation: : Oriented to Self, Oriented to Place, Oriented to  Time, Oriented to Situation      Admission diagnosis:  Hypokalemia [E87.6] Hypoglycemia [E16.2] Patient Active Problem List   Diagnosis Date Noted   BMI less than 19,adult 10/15/2023   Protein-calorie malnutrition, severe 10/15/2023   History of substance use disorder 08/26/2023   Thrombocytopenia (HCC) 08/26/2023   Chronic anemia 08/26/2023   Hypomagnesemia 08/16/2023   Alcohol abuse 07/25/2023   Alcoholic ketoacidosis 07/20/2023   Wernicke encephalopathy 07/16/2023   Acute pancreatitis 07/05/2023   Encephalopathy 07/04/2023   Elevated liver enzymes 07/04/2023   Falls frequently 07/04/2023   Abdominal pain 07/04/2023   Weakness 07/04/2023   Metabolic acidosis 07/04/2023   Physical deconditioning 06/22/2023   History of non anemic vitamin B12 deficiency 06/20/2023   Dehydration 06/18/2023   Aspiration pneumonia of left lower lobe (HCC) 06/18/2023   AKI (acute kidney injury) (HCC) 06/13/2023   Aortic atherosclerosis (HCC) 05/26/2023   Nausea &  vomiting 05/03/2023   Alcoholic peripheral neuropathy (HCC) 05/03/2023   Breakthrough seizure (HCC) 01/23/2023   Localized skin desquamation 01/06/2023   Hypothyroidism 01/06/2023   Vitamin D  deficiency 01/06/2023   CKD (chronic kidney disease) stage 3, GFR 30-59 ml/min (HCC) 01/06/2023   Abnormal LFTs 12/28/2022   Chronic diarrhea  12/28/2022   Heme positive stool 12/28/2022   Malnutrition (HCC) 12/27/2022   Electrolyte abnormality 12/26/2022   Hypocalcemia 11/19/2022   Hypoalbuminemia due to protein-calorie malnutrition (HCC) 11/19/2022   Hyperlipidemia 11/19/2022   Elevated lipase 11/19/2022   Osteoarthritis 09/02/2022   History of noncompliance with medical treatment 07/27/2022   Hepatic steatosis 07/27/2022   Acute pain of right knee 07/08/2022   Transaminitis 07/08/2022   Other pancytopenia (HCC) 07/08/2022   Alcohol withdrawal syndrome without complication (HCC) 07/07/2022   Duodenal ulcer hemorrhage 06/03/2022   AVM (arteriovenous malformation) of stomach, 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   Anemia due to chronic blood loss 06/03/2022   Dysphagia 03/28/2020   Cervicalgia 03/28/2020   Hot flashes 01/30/2020   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   Depression 07/08/2016   Seizure disorder (HCC) 07/08/2016   Cocaine use    Gout of left ankle 10/09/2015   Left hip pain 09/02/2015   Colonoscopy refused 09/02/2015   ETOH abuse 09/02/2015   Hyperuricemia 05/27/2015   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   Left shoulder pain 04/25/2015   Allergic rhinitis 05/13/2014   GERD without esophagitis 04/01/2014   Osteoarthritis of left knee 12/11/2013   PCP:  Harrie Bruckner, DO Pharmacy:   [id number] [place], [place] - [address] AT [facility] OF [address] [place] [address] Phone: [phone] Fax: [phone]  [id number] - [place], [place] - [address] AT [facility] OF [address] [facility] [address] & [address] [employer] [place] [address] Phone: [phone] Fax: [phone]  [facility] [address] [place] [address] Phone: [phone] Fax: [phone]     Social Drivers of Health (SDOH) Social History: SDOH Screenings   Food  Insecurity: No Food Insecurity (10/14/2023)  Housing: Low Risk  (10/14/2023)  Transportation Needs: No Transportation Needs (10/14/2023)  Utilities: At Risk (10/14/2023)  Depression (PHQ2-9): Low Risk  (06/30/2023)  Tobacco Use: Medium Risk (08/25/2023)   SDOH Interventions:     Readmission Risk Interventions    06/22/2023    2:16 PM 11/21/2022    1:01 PM  Readmission Risk Prevention Plan  Transportation Screening Complete Complete  Medication Review Oceanographer) Complete Complete  PCP or Specialist appointment within 3-5 days of discharge Complete Complete  HRI or Home Care Consult Complete Complete  SW Recovery Care/Counseling Consult Complete   Palliative Care Screening Not Applicable Not Applicable  Skilled Nursing Facility Patient Refused Not Applicable

## 2023-10-17 NOTE — Plan of Care (Signed)

## 2023-10-17 NOTE — Progress Notes (Signed)
 Occupational Therapy Treatment Patient Details Name: Christina Byrd MRN: [id number] DOB: 1966/02/11 Today's Date: 10/17/2023   History of present illness 58 y.o. female presents to [facility] 10/14/23 with AMS. Admitted with severe malnutrition w/ hypoglycemia and electrolyte disturbances. CIWA precautions. PMHx: CKD 3b, OA, alcohol abuse, alcoholic pancreatitis, seizure disorder, Wernicke's encephalopathy.   OT comments  Pt resting in bed, c/o fatigue, ambulated a couple times earlier and has poor activity tolerance and weakness in BLEs. Pt has fear of falling upon returning home. Pt able to ambulate 60 feet with HHA earlier today, c/o BLE pain/swelling. During OT session Pt with difficulty standing, able to ambulate very short distance at bedside. Pt able to don/doff socks, Pt with R shoulder stiffness, difficulty with ER likely history of RTC tear, uses L hand for AAROM to bring hand overhead. Pt reports she has DME at home but due to large furniture is not able to fit RW around home and just holds onto furniture and walls for ambulation. Pt would benefit from postacute rehab <3hrs/day to maximize functional strength and activity tolerance, will continue to see acutely to progress as able.       If plan is discharge home, recommend the following:  A little help with walking and/or transfers;A little help with bathing/dressing/bathroom;Assist for transportation;Direct supervision/assist for financial management;Direct supervision/assist for medications management   Equipment Recommendations  None recommended by OT    Recommendations for Other Services      Precautions / Restrictions Precautions Precautions: Fall Recall of Precautions/Restrictions: Intact Restrictions Weight Bearing Restrictions Per Provider Order: No       Mobility Bed Mobility Overal bed mobility: Needs Assistance Bed Mobility: Supine to Sit, Sit to Supine     Supine to sit: Supervision Sit to supine: Supervision    General bed mobility comments: supervision for safety    Transfers Overall transfer level: Needs assistance Equipment used: 1 person hand held assist Transfers: Sit to/from Stand Sit to Stand: Contact guard assist           General transfer comment: 1 HHA CGA for safety     Balance Overall balance assessment: Needs assistance Sitting-balance support: No upper extremity supported, Feet supported Sitting balance-Leahy Scale: Fair     Standing balance support: Single extremity supported, During functional activity, Reliant on assistive device for balance Standing balance-Leahy Scale: Poor Standing balance comment: reliant on UE support                           ADL either performed or assessed with clinical judgement   ADL Overall ADL's : Needs assistance/impaired                 Upper Body Dressing : Set up;Sitting   Lower Body Dressing: Minimal assistance;Sit to/from stand   Toilet Transfer: Contact guard assist           Functional mobility during ADLs: Contact guard assist General ADL Comments: Pt fatigued, poor activity tolerance for ambulating around room with 1 HHA, does well with ADLs but difficulty standing so needs assist with don/doff pants    Extremity/Trunk Assessment Upper Extremity Assessment Upper Extremity Assessment: Generalized weakness            Vision       Perception     Praxis     Communication Communication Communication: Impaired Factors Affecting Communication: Reduced clarity of speech   Cognition Arousal: Alert Behavior During Therapy: WFL for tasks assessed/performed Cognition: No  family/caregiver present to determine baseline             OT - Cognition Comments: fully participated in therapy                 Following commands: Intact        Cueing   Cueing Techniques: Verbal cues  Exercises      Shoulder Instructions       General Comments      Pertinent Vitals/ Pain        Pain Assessment Pain Assessment: No/denies pain  Home Living                                          Prior Functioning/Environment              Frequency  Min 2X/week        Progress Toward Goals  OT Goals(current goals can now be found in the care plan section)  Progress towards OT goals: Progressing toward goals  Acute Rehab OT Goals Patient Stated Goal: to improve strength in BLEs OT Goal Formulation: With patient Time For Goal Achievement: 10/29/23 Potential to Achieve Goals: Fair ADL Goals Pt Will Perform Grooming: with modified independence;standing Pt Will Perform Lower Body Dressing: with modified independence;sit to/from stand Pt Will Transfer to Toilet: with modified independence;ambulating Additional ADL Goal #1: Pt will demonstrate independence with 3 fall prevention strategies.  Plan      Co-evaluation                 AM-PAC OT 6 Clicks Daily Activity     Outcome Measure   Help from another person eating meals?: None Help from another person taking care of personal grooming?: A Little Help from another person toileting, which includes using toliet, bedpan, or urinal?: A Little Help from another person bathing (including washing, rinsing, drying)?: A Little Help from another person to put on and taking off regular upper body clothing?: A Little Help from another person to put on and taking off regular lower body clothing?: A Little 6 Click Score: 19    End of Session Equipment Utilized During Treatment: Gait belt  OT Visit Diagnosis: Other abnormalities of gait and mobility (R26.89);Muscle weakness (generalized) (M62.81);Other symptoms and signs involving cognitive function   Activity Tolerance Patient limited by fatigue   Patient Left in bed;with call bell/phone within reach   Nurse Communication Mobility status        Time: 8388-8375 OT Time Calculation (min): 13 min  Charges: OT General Charges $OT Visit: 1  Visit OT Treatments $Therapeutic Activity: 8-22 mins  [address], OTR/L   Elouise JONELLE Bott 10/17/2023, 4:29 PM

## 2023-10-18 ENCOUNTER — Encounter (HOSPITAL_COMMUNITY): Payer: Self-pay | Admitting: Internal Medicine

## 2023-10-18 LAB — CBC
HCT: 22.6 % — ABNORMAL LOW (ref 36.0–46.0)
Hemoglobin: 7.8 g/dL — ABNORMAL LOW (ref 12.0–15.0)
MCH: 33.2 pg (ref 26.0–34.0)
MCHC: 34.5 g/dL (ref 30.0–36.0)
MCV: 96.2 fL (ref 80.0–100.0)
Platelets: 145 K/uL — ABNORMAL LOW (ref 150–400)
RBC: 2.35 MIL/uL — ABNORMAL LOW (ref 3.87–5.11)
RDW: 21 % — ABNORMAL HIGH (ref 11.5–15.5)
WBC: 4.2 K/uL (ref 4.0–10.5)
nRBC: 0 % (ref 0.0–0.2)

## 2023-10-18 LAB — BASIC METABOLIC PANEL WITH GFR
Anion gap: 6 (ref 5–15)
BUN: 8 mg/dL (ref 6–20)
CO2: 23 mmol/L (ref 22–32)
Calcium: 7.4 mg/dL — ABNORMAL LOW (ref 8.9–10.3)
Chloride: 106 mmol/L (ref 98–111)
Creatinine, Ser: 1.01 mg/dL — ABNORMAL HIGH (ref 0.44–1.00)
GFR, Estimated: >60 mL/min (ref >60)
Glucose, Bld: 81 mg/dL (ref 70–99)
Potassium: 4 mmol/L (ref 3.5–5.1)
Sodium: 135 mmol/L (ref 135–145)

## 2023-10-18 LAB — GLUCOSE, CAPILLARY
Glucose-Capillary: 85 mg/dL (ref 70–99)
Glucose-Capillary: 133 mg/dL — ABNORMAL HIGH (ref 70–99)
Glucose-Capillary: 111 mg/dL — ABNORMAL HIGH (ref 70–99)
Glucose-Capillary: 123 mg/dL — ABNORMAL HIGH (ref 70–99)

## 2023-10-18 LAB — MAGNESIUM: Magnesium: 1.4 mg/dL — ABNORMAL LOW (ref 1.7–2.4)

## 2023-10-18 LAB — PHOSPHORUS: Phosphorus: 3.3 mg/dL (ref 2.5–4.6)

## 2023-10-18 MED ORDER — MAGNESIUM SULFATE 4 GM/100ML IV SOLN
4.0000 g | Freq: Once | INTRAVENOUS | Status: AC
  Administered 2023-10-18: 4 g via INTRAVENOUS
  Filled 2023-10-18: qty 100

## 2023-10-18 MED ORDER — MAGNESIUM OXIDE -MG SUPPLEMENT 400 (240 MG) MG PO TABS
400.0000 mg | ORAL_TABLET | Freq: Every day | ORAL | Status: DC
Start: 2023-10-18 — End: 2023-10-20
  Administered 2023-10-18 – 2023-10-20 (×3): 400 mg via ORAL
  Filled 2023-10-18 (×3): qty 1

## 2023-10-18 NOTE — Progress Notes (Addendum)
 HD#2 Subjective:   Summary: 58 year old female with a past medical history of alcohol use disorder, previous seizures, history of electrolyte derangements presenting with concerns of unresponsiveness, and found to have electrolyte derangements.  Overnight Events: No acute concerns overnight  Patient was sleeping this morning with partner at bedside. Patient reports that she did have episode of nausea last night, but states that it resolved on it's own and denied any chest pain or abdominal pain associated with it.  Objective:  Vital signs in last 24 hours: Vitals:   10/17/23 1953 10/17/23 2359 10/18/23 0407 10/18/23 0722  BP: 132/89 (!) 131/92 (!) 130/92 (!) 127/91  Pulse: 83 78 85 84  Resp: 18 18 18    Temp: 98.4 F (36.9 C) 98.6 F (37 C) 98.5 F (36.9 C) 97.8 F (36.6 C)  TempSrc: Oral Oral Oral Oral  SpO2: 100% 100% 98% 100%  Weight:      Height:       Supplemental O2: Room Air SpO2: 100 %   Physical Exam:  Constitutional: Sitting up in bed HENT: normocephalic atraumatic Cardiovascular: regular rate and rhythm, no m/r/g Pulmonary/Chest: normal work of breathing on room air, lungs clear to auscultation bilaterally Neuro:  alert and oriented x 3  Filed Weights   10/14/23 1143  Weight: 43.5 kg     Intake/Output Summary (Last 24 hours) at 10/18/2023 1012 Last data filed at 10/18/2023 0000 Gross per 24 hour  Intake 480 ml  Output --  Net 480 ml   Net IO Since Admission: 3,453.14 mL [10/18/23 1012]  Pertinent Labs:    Latest Ref Rng & Units 10/18/2023    4:28 AM 10/17/2023    3:54 AM 10/17/2023    3:53 AM  CBC  WBC 4.0 - 10.5 K/uL 4.2  3.7  3.6   Hemoglobin 12.0 - 15.0 g/dL 7.8  7.3  7.3   Hematocrit 36.0 - 46.0 % 22.6  21.2  21.3   Platelets 150 - 400 K/uL 145  129  128        Latest Ref Rng & Units 10/18/2023    4:28 AM 10/17/2023    3:54 AM 10/17/2023    3:53 AM  CMP  Glucose 70 - 99 mg/dL 81  89    BUN 6 - 20 mg/dL 8  6    Creatinine 9.55 - 1.00  mg/dL 8.98  9.00    Sodium 864 - 145 mmol/L 135  137    Potassium 3.5 - 5.1 mmol/L 4.0  3.9    Chloride 98 - 111 mmol/L 106  109    CO2 22 - 32 mmol/L 23  22    Calcium  8.9 - 10.3 mg/dL 7.4  7.1    Total Protein 6.5 - 8.1 g/dL   4.5   Total Bilirubin 0.0 - 1.2 mg/dL   0.2   Alkaline Phos 38 - 126 U/L   158   AST 15 - 41 U/L   68   ALT 0 - 44 U/L   21     Imaging: No results found.  Assessment/Plan:   Active Problems:   Other pancytopenia (HCC)   Malnutrition (HCC)   Hypothyroidism   Wernicke encephalopathy   Alcoholic ketoacidosis   Alcohol abuse   Hypomagnesemia   BMI less than 19,adult   Patient Summary: CHRISSY EALEY is a 58 y.o. female with a past medical history of alcohol use disorder, previous seizures, history of electrolyte derangements presenting with concerns of unresponsiveness, and found  to have electrolyte derangements and hypoglycemia. She is now medically ready for discharge and is pending SNF placement.   #Severe malnutrition #Hypokalemia #Hypomagnesemia #Hypophosphatemia Patient is eating better now.  Sugars are measuring better.   Monitoring electrolytes in the setting of refeeding syndrome. Magnesium  was 1.4 this morning and repleted yesterday. Did replete again this morning. Encouraged her to continue to eat well   - PT recommended SNF. Patient has no bed offers at this time.  -Will monitor electrolytes with BMP and MG. Replete as needed  - Patient is interested in quitting drinking. She was encouraged to continue to eat more.   #Alcohol use disorder #History of seizures #Wernicke's encephalopathy  Encourage patient to stop alcohol use. No signs of withdrawal at this time. Continue on home keppra  with hx of seizures  - Continue 100mg  thiamine  PO - Continue Keppra  750 mg twice daily - Start naltrexone  on discharge - PT recommending SNF   #Pancytopenia,resolved #anemia #thrombocytopenia  No acute signs of bleeding.  Patient's hemoglobin is  stable at 7.8. From previous anemia studies ,fits anemia of chronic disease picture. PLT mildly improved to 145 today. Likely due to bone marrow toxicity. Cell lines look improved compared to yesterday.  - continue folic acid  supplementation  -Continue to monitor for signs of bleeding -Monitor platelet count  #Acute kidney injury, resolved Creatinine at 1.01. Likely due to decreased PO intake.  - Will continue to monitor with BMP   #Hypothyroidism No acute concerns. - Continue home levothyroxine  25 mcg daily  #GERD  No acute concerns - Continue pantoprazole  40 mg twice daily  #Hyperlipidemia No acute concerns - Continue atorvastatin  20 mg daily  #Alcoholic neuropathy - Continue gabapentin  100 mg 3 times daily - Continue duloxetine  30 mg daily  Diet: Carb-Modified IVF: None,None VTE: Enoxaparin  Code: Full PT/OT recs: SNF Family Update: Boyfriend updated at bedside    Dispo: Anticipated discharge to SNF pending placement. Patient is medically ready for discharge.   Arkeem Harts D'Mello DO  Internal Medicine Resident PGY-1 Please contact the on call pager after 5 pm and on weekends at [phone]

## 2023-10-18 NOTE — Plan of Care (Signed)

## 2023-10-18 NOTE — Progress Notes (Signed)
 Physical Therapy Treatment Patient Details Name: Christina Byrd MRN: [id number] DOB: 16-Feb-1966 Today's Date: 10/18/2023   History of Present Illness 58 y.o. female presents to [facility] 10/14/23 with AMS. Admitted with severe malnutrition w/ hypoglycemia and electrolyte disturbances. CIWA precautions. PMHx: CKD 3b, OA, alcohol abuse, alcoholic pancreatitis, seizure disorder, Wernicke's encephalopathy.    PT Comments  Pt reports that she is hopeful to work with therapy a few days before going home. PT brought cane to trial with ambulation earlier when pt asked PT to come back later. Pt reports she does not need an AD at home as her furniture is close enough to each other to hold onto. Pt powers up from EoB and immediately reaches to IV pole to steady. Able to walk in hallway ~75 feet with IV pole and contact guard assist. With distance pt with increasingly flexed posture and looking down at her feet so as not to trip over IV pole base. Informed pt that she would be working with cane to increase her safety with ambulation. Will inform Mobility Specialist to work with pt and cane tomorrow. Pt reports fatigue and request to return to room to finish her lunch.      If plan is discharge home, recommend the following: A little help with walking and/or transfers;A little help with bathing/dressing/bathroom;Assist for transportation;Help with stairs or ramp for entrance   Can travel by private vehicle     Yes  Equipment Recommendations  Other (comment) (Pt reports she has Rollator to use in the community and nothing will fit in her home so she will continue to use furniture for support)       Precautions / Restrictions Precautions Precautions: Fall Restrictions Weight Bearing Restrictions Per Provider Order: No     Mobility  Bed Mobility               General bed mobility comments: sitting EoB on entry eating lunch but agreeble to work with therapy    Transfers Overall transfer level:  Needs assistance Equipment used: None Transfers: Sit to/from Stand Sit to Stand: Contact guard assist           General transfer comment: CGA for safety, increased time and effort for steadying using IV pole    Ambulation/Gait Ambulation/Gait assistance: Contact guard assist Gait Distance (Feet): 75 Feet Assistive device: IV Pole Gait Pattern/deviations: Step-through pattern, Decreased stride length, Trunk flexed Gait velocity: decr Gait velocity interpretation: <1.31 ft/sec, indicative of household ambulator   General Gait Details: brought cane to trial but pt reports she prefers to used IV pole, given increasingly flexed posture with IV pole and increased downward gaze to keep from stepping on IV pole wheels, will insist on use of cane in next session,         Balance Overall balance assessment: Needs assistance Sitting-balance support: No upper extremity supported, Feet supported Sitting balance-Leahy Scale: Fair Sitting balance - Comments: static sitting intact   Standing balance support: During functional activity, Reliant on assistive device for balance, Single extremity supported Standing balance-Leahy Scale: Poor Standing balance comment: reliant on UE support                            Communication Communication Communication: Impaired Factors Affecting Communication: Reduced clarity of speech  Cognition Arousal: Alert Behavior During Therapy: WFL for tasks assessed/performed   PT - Cognitive impairments: No family/caregiver present to determine baseline, Safety/Judgement  PT - Cognition Comments: decreased safety awareness Following commands: Intact (can follow commands but is selective in what she will do)      Cueing Cueing Techniques: Verbal cues     General Comments General comments (skin integrity, edema, etc.): VSS on RA      Pertinent Vitals/Pain Pain Assessment Pain Assessment: Faces Faces Pain  Scale: Hurts even more Pain Descriptors / Indicators: Numbness, Tingling, Pins and needles Pain Intervention(s): Limited activity within patient's tolerance, Monitored during session, Repositioned     PT Goals (current goals can now be found in the care plan section) Acute Rehab PT Goals Patient Stated Goal: to go to rehab and have less falls PT Goal Formulation: With patient Time For Goal Achievement: 10/30/23 Potential to Achieve Goals: Good Progress towards PT goals: Progressing toward goals    Frequency    Min 2X/week       AM-PAC PT 6 Clicks Mobility   Outcome Measure  Help needed turning from your back to your side while in a flat bed without using bedrails?: None Help needed moving from lying on your back to sitting on the side of a flat bed without using bedrails?: None Help needed moving to and from a bed to a chair (including a wheelchair)?: A Little Help needed standing up from a chair using your arms (e.g., wheelchair or bedside chair)?: A Little Help needed to walk in Byrd room?: A Little Help needed climbing 3-5 steps with a railing? : A Lot 6 Click Score: 19    End of Session Equipment Utilized During Treatment: Gait belt Activity Tolerance: Patient tolerated treatment well Patient left: in bed;with call bell/phone within reach;with bed alarm set Nurse Communication: Mobility status PT Visit Diagnosis: Other abnormalities of gait and mobility (R26.89);Muscle weakness (generalized) (M62.81);History of falling (Z91.81)     Time: 8592-8583 PT Time Calculation (min) (ACUTE ONLY): 9 min  Charges:    $Gait Training: 8-22 mins PT General Charges $$ ACUTE PT VISIT: 1 Visit                     Deltha Bernales B. Fleeta Lapidus PT, DPT Acute Rehabilitation Services Please use secure chat or  Call Office [phone]    Christina Byrd 10/18/2023, 2:46 PM

## 2023-10-18 NOTE — Progress Notes (Signed)
 Mobility Specialist: Progress Note   10/18/23 1500  Mobility  Activity Ambulated with assistance  Level of Assistance Contact guard assist, steadying assist  Assistive Device Other (Comment) (IV pole)  Distance Ambulated (ft) 80 ft  Activity Response Tolerated well  Mobility Referral Yes  Mobility visit 1 Mobility  Mobility Specialist Start Time (ACUTE ONLY) 1041  Mobility Specialist Stop Time (ACUTE ONLY) 1048  Mobility Specialist Time Calculation (min) (ACUTE ONLY) 7 min    Pt received in bed, agreeable to mobility session. CGA w/ IV pole. C/o R knee pain and swelling. Returned to room without fault. Left in bed with all needs met, call bell in reach.   Ileana Lute Mobility Specialist Please contact via SecureChat or Rehab office at [phone]

## 2023-10-18 NOTE — Progress Notes (Signed)
 PT Cancellation Note  Patient Details Name: Christina Byrd MRN: [id number] DOB: 1965-08-14   Cancelled Treatment:    Reason Eval/Treat Not Completed: (P) Other (comment) Pt reports she just walked with Mobility and is too tired to try again. Pt reports she might be able to work with therapy this afternoon.  Ladarryl Wrage B. Fleeta Lapidus PT, DPT Acute Rehabilitation Services Please use secure chat or  Call Office [phone]    Almarie KATHEE Fleeta [facility] 10/18/2023, 11:25 AM

## 2023-10-18 NOTE — Progress Notes (Signed)
 Transition of Care [facility]) - Inpatient Brief Assessment   Patient Details  Name: Christina Byrd MRN: [id number] Date of Birth: 1965-11-11  Transition [facility] CM/SW Contact:    Rosaline JONELLE Joe, RN Phone Number: 10/18/2023, 10:35 AM   Clinical Narrative: Patient admitted to the hospital with weakness and hypomagnesemia.  Patient lives with her spouse at the home but states that spouse is currently sick and unable to assist due to his own health issues.  No SNF bed offers are available at this time for placement.  Patient was updated and states that she is still weak and requiring gait belt for balance with therapy but is in agreement to return home in the next 1-2 days if able to mobilize better.  MD was updated.  I was unable to provide home health services due to patient's payor source.  Patient was agreeable to outpatient PT services - referral will be placed.  Patient has had referrals in the past for outpatient rehab but has no followed up to receive these services.  CM with IP Care management team will continue to follow  - patient to return home with spouse when stronger and cleared by therapy to return home when mobilizing better.   Transition of Care Asessment: Insurance and Status: (P) Insurance coverage has been reviewed Patient has primary care physician: (P) Yes Home environment has been reviewed: (P) from home with spouse Prior level of function:: (P) self Prior/Current Home Services: (P) No current home services Social Drivers of Health Review: (P) SDOH reviewed needs interventions Readmission risk has been reviewed: (P) Yes Transition of care needs: (P) transition of care needs identified, TOC will continue to follow

## 2023-10-19 LAB — CBC
HCT: 22.7 % — ABNORMAL LOW (ref 36.0–46.0)
Hemoglobin: 7.8 g/dL — ABNORMAL LOW (ref 12.0–15.0)
MCH: 32.9 pg (ref 26.0–34.0)
MCHC: 34.4 g/dL (ref 30.0–36.0)
MCV: 95.8 fL (ref 80.0–100.0)
Platelets: 156 K/uL (ref 150–400)
RBC: 2.37 MIL/uL — ABNORMAL LOW (ref 3.87–5.11)
RDW: 20.8 % — ABNORMAL HIGH (ref 11.5–15.5)
WBC: 4.8 K/uL (ref 4.0–10.5)
nRBC: 0 % (ref 0.0–0.2)

## 2023-10-19 LAB — BASIC METABOLIC PANEL WITH GFR
Anion gap: 8 (ref 5–15)
BUN: 10 mg/dL (ref 6–20)
CO2: 22 mmol/L (ref 22–32)
Calcium: 7.6 mg/dL — ABNORMAL LOW (ref 8.9–10.3)
Chloride: 105 mmol/L (ref 98–111)
Creatinine, Ser: 0.86 mg/dL (ref 0.44–1.00)
GFR, Estimated: >60 mL/min (ref >60)
Glucose, Bld: 83 mg/dL (ref 70–99)
Potassium: 3.9 mmol/L (ref 3.5–5.1)
Sodium: 135 mmol/L (ref 135–145)

## 2023-10-19 LAB — GLUCOSE, CAPILLARY
Glucose-Capillary: 78 mg/dL (ref 70–99)
Glucose-Capillary: 134 mg/dL — ABNORMAL HIGH (ref 70–99)
Glucose-Capillary: 296 mg/dL — ABNORMAL HIGH (ref 70–99)
Glucose-Capillary: 108 mg/dL — ABNORMAL HIGH (ref 70–99)

## 2023-10-19 LAB — MAGNESIUM: Magnesium: 2.1 mg/dL (ref 1.7–2.4)

## 2023-10-19 MED ORDER — INSULIN ASPART 100 UNIT/ML IJ SOLN: 0.0000 [IU] | Freq: Three times a day (TID) | INTRAMUSCULAR | Status: DC

## 2023-10-19 MED ORDER — INSULIN ASPART 100 UNIT/ML IJ SOLN: 0.0000 [IU] | Freq: Every day | INTRAMUSCULAR | Status: DC

## 2023-10-19 MED ORDER — PANTOPRAZOLE SODIUM 40 MG PO TBEC
40.0000 mg | DELAYED_RELEASE_TABLET | Freq: Every day | ORAL | Status: DC
  Administered 2023-10-20: 40 mg via ORAL
  Filled 2023-10-19: qty 1

## 2023-10-19 NOTE — Progress Notes (Addendum)
 Nutrition Follow-up  DOCUMENTATION CODES:   Severe malnutrition in context of social or environmental circumstances  INTERVENTION:  -Consider liberalization to regular menu, regular texture, thin liquids -D/C Ensure d/t pt refusal -Continue Magic Cup TID -Discussed importance of adequate kcal/pro intake, encouraged increased PO intake as able   NUTRITION DIAGNOSIS:   Severe Malnutrition related to social / environmental circumstances, chronic illness as evidenced by severe muscle depletion, percent weight loss, severe fat depletion.  GOAL:   Patient will meet greater than or equal to 90% of their needs  Progressing  MONITOR:   PO intake, Supplement acceptance, Weight trends, Labs, Skin  REASON FOR ASSESSMENT:   Consult Assessment of nutrition requirement/status  ASSESSMENT:   58 y.o. female presented to the ED with altered mental status. PMH includes EtOH abuse, hypothyroidism, GERD, CKD III, and HLD. Pt admitted with hypoglycemia due to poor PO intake, EtOH abuse, and AKI.  Spoke to pt at bedside. Pt denies n/v/c/d or chewing/swallowing difficulties. Last BM 8/25. Pt with two documented meals, 25% and 50%. Pt with two trays with 4 different sandwiches in room. Pt states she does not like the food here but eats the deli sandwiches. Pt has been receiving Ensure Plus High Protein TID, has been refusing most of these. Pt states she does not like them either, will d/c. Pt does state drinking ONS at home, unsure of kind. Encouraged pt to drink at least 1-2 daily. Pt does endorse good PO intake at home, though, could not elaborate. P's weight continues to decline unintentionally. Weight loss of 11.1 kg x 11 months (20%), which is severe. Discussed importance of adequate kcal/pro intake, encouraged increased PO intake as able. Pt does enjoy ice cream, will continue MC BID. NFPE completed (see below), pt meets criteria for severe protein calorie malnutrition. Pt denies additional  questions/concerns at this time, will continue to monitor, RDN available prn.   Labs BG 78-296 Calcium  7.6 ALK 158 Albumin 1.6 AST 68 Folate 5.8 H/H 7.8/22.7  Medications  atorvastatin   20 mg Oral Daily   DULoxetine   30 mg Oral Daily   enoxaparin  (LOVENOX ) injection  30 mg Subcutaneous Q24H   feeding supplement  237 mL Oral TID BM   folic acid   1 mg Oral Daily   gabapentin   100 mg Oral TID   insulin  aspart  0-5 Units Subcutaneous QHS   [START ON 10/20/2023] insulin  aspart  0-6 Units Subcutaneous TID WC   levETIRAcetam   750 mg Oral BID   levothyroxine   25 mcg Oral QAC breakfast   magnesium  oxide  400 mg Oral Daily   multivitamin with minerals  1 tablet Oral Daily   [START ON 10/20/2023] pantoprazole   40 mg Oral Daily   thiamine   100 mg Oral Daily     NUTRITION - FOCUSED PHYSICAL EXAM:  Flowsheet Row Most Recent Value  Orbital Region Moderate depletion  Upper Arm Region Severe depletion  Thoracic and Lumbar Region Moderate depletion  Buccal Region Severe depletion  Temple Region Moderate depletion  Clavicle Bone Region Moderate depletion  Clavicle and Acromion Bone Region Severe depletion  Scapular Bone Region Moderate depletion  Dorsal Hand Moderate depletion  Patellar Region Severe depletion  Anterior Thigh Region Severe depletion  Posterior Calf Region Severe depletion  Edema (RD Assessment) None  Hair Reviewed  Eyes Reviewed  Mouth Reviewed  Skin Reviewed  Nails Reviewed    Diet Order:   Diet Order             Diet Carb  Modified Fluid consistency: Thin; Room service appropriate? Yes  Diet effective now                   EDUCATION NEEDS:   Education needs have been addressed  Skin:  Skin Assessment: Reviewed RN Assessment  Last BM:  8/25  Height:   Ht Readings from Last 1 Encounters:  10/14/23 5' 6 (1.676 m)    Weight:   Wt Readings from Last 1 Encounters:  10/14/23 43.5 kg    Ideal Body Weight:  59.1 kg  BMI:  Body mass index is  15.49 kg/m.  Estimated Nutritional Needs:   Kcal:  1500-1700  Protein:  75-95 grams  Fluid:  >/= 1.5 L  Kajuana Shareef Daml-Budig, RDN, LDN Registered Dietitian Nutritionist RD Inpatient Contact Info in [place]

## 2023-10-19 NOTE — Progress Notes (Signed)
 HD#3 Subjective:   Summary: 58 year old female with a past medical history of alcohol use disorder, previous seizures, history of electrolyte derangements presenting with concerns of unresponsiveness, and found to have electrolyte derangements.  Overnight Events: No acute concerns overnight  Patient was sleeping in bed this morning with partner by her side. She states that she is willing to work with PT, OT and mobility to get stronger and will continue to eat.  Objective:  Vital signs in last 24 hours: Vitals:   10/18/23 2030 10/18/23 2300 10/18/23 2337 10/19/23 0502  BP: 126/86 124/88 124/88 (!) 132/95  Pulse: 79 80 80 86  Resp:      Temp: 99.2 F (37.3 C) 98.2 F (36.8 C) 98.2 F (36.8 C) 98.1 F (36.7 C)  TempSrc: Oral Oral    SpO2: 100% 100% 100% 100%  Weight:      Height:       Supplemental O2: Room Air SpO2: 100 %   Physical Exam:  Constitutional: Sitting up in bed HENT: normocephalic atraumatic Cardiovascular: regular rate and rhythm, no m/r/g Pulmonary/Chest: normal work of breathing on room air, lungs clear to auscultation bilaterally Neuro:  alert and oriented x 3  Filed Weights   10/14/23 1143  Weight: 43.5 kg     Intake/Output Summary (Last 24 hours) at 10/19/2023 0645 Last data filed at 10/18/2023 1935 Gross per 24 hour  Intake 1080 ml  Output --  Net 1080 ml   Net IO Since Admission: 4,533.14 mL [10/19/23 0645]  Pertinent Labs:    Latest Ref Rng & Units 10/19/2023    2:36 AM 10/18/2023    4:28 AM 10/17/2023    3:54 AM  CBC  WBC 4.0 - 10.5 K/uL 4.8  4.2  3.7   Hemoglobin 12.0 - 15.0 g/dL 7.8  7.8  7.3   Hematocrit 36.0 - 46.0 % 22.7  22.6  21.2   Platelets 150 - 400 K/uL 156  145  129        Latest Ref Rng & Units 10/19/2023    2:36 AM 10/18/2023    4:28 AM 10/17/2023    3:54 AM  CMP  Glucose 70 - 99 mg/dL 83  81  89   BUN 6 - 20 mg/dL 10  8  6    Creatinine 0.44 - 1.00 mg/dL 9.13  8.98  9.00   Sodium 135 - 145 mmol/L 135  135  137    Potassium 3.5 - 5.1 mmol/L 3.9  4.0  3.9   Chloride 98 - 111 mmol/L 105  106  109   CO2 22 - 32 mmol/L 22  23  22    Calcium  8.9 - 10.3 mg/dL 7.6  7.4  7.1     Imaging: No results found.  Assessment/Plan:   Active Problems:   Other pancytopenia (HCC)   Malnutrition (HCC)   Hypothyroidism   Wernicke encephalopathy   Alcoholic ketoacidosis   Alcohol abuse   Hypomagnesemia   BMI less than 19,adult   Patient Summary: Christina Byrd is a 58 y.o. female with a past medical history of alcohol use disorder, previous seizures, history of electrolyte derangements presenting with concerns of unresponsiveness, and found to have electrolyte derangements and hypoglycemia. She is now medically ready for discharge  but given no SNF option, will keep patient so that she is able to benefit from some inpatient therapy   #Severe malnutrition #Hypokalemia #Hypomagnesemia #Hypophosphatemia Patient is eating better now.  Monitoring electrolytes in the setting of refeeding  syndrome. Magnesium  was 2.1 this morning. Encouraged her to continue to eat well   - PT recommended SNF. Patient has no bed offers at this time so she will work with PT/mobility today and hopefully d/c tomorrow.  -Will monitor electrolytes with BMP and MG. Replete as needed  - Patient is interested in quitting drinking. She was encouraged to continue to eat more.   #Alcohol use disorder #History of seizures #Wernicke's encephalopathy  Encourage patient to stop alcohol use. No signs of withdrawal at this time.  - Continue 100mg  thiamine  PO - Continue Keppra  750 mg twice daily - Start naltrexone  on discharge   #Pancytopenia,resolved #anemia #thrombocytopenia, resolved   No acute signs of bleeding.  Patient's hemoglobin is stable at 7.8. From previous anemia studies ,fits anemia of chronic disease picture. PLT at 156 today. Likely due to bone marrow toxicity. Cell lines look improved  - continue folic acid  supplementation   -Continue to monitor for signs of bleeding -Monitor platelet count  #Acute kidney injury, resolved Creatinine at .86. Likely due to decreased PO intake.  - Will continue to monitor with BMP   #Hypothyroidism No acute concerns. - Continue home levothyroxine  25 mcg daily  #GERD  No acute concerns - Continue pantoprazole  40 mg twice daily  #Hyperlipidemia No acute concerns - Continue atorvastatin  20 mg daily  #Alcoholic neuropathy - Continue gabapentin  100 mg 3 times daily - Continue duloxetine  30 mg daily  Diet: Carb-Modified IVF: None,None VTE: Enoxaparin  Code: Full PT/OT recs: Outpatient PT  Family Update: Boyfriend updated at bedside    Dispo: Patient is continuing to work with PT as she was declined for SNF and will go home with home PT .   Ajai Terhaar D'Mello DO  Internal Medicine Resident PGY-1 Please contact the on call pager after 5 pm and on weekends at [phone]

## 2023-10-19 NOTE — Plan of Care (Signed)

## 2023-10-19 NOTE — Progress Notes (Signed)
 Occupational Therapy Treatment Patient Details Name: Christina Byrd MRN: [id number] DOB: June 16, 1965 Today's Date: 10/19/2023   History of present illness 58 y.o. female presents to [facility] 10/14/23 with AMS. Admitted with severe malnutrition w/ hypoglycemia and electrolyte disturbances. CIWA precautions. PMHx: CKD 3b, OA, alcohol abuse, alcoholic pancreatitis, seizure disorder, Wernicke's encephalopathy.   OT comments  Patient received in supine with complaints of right knee pain. Patient agreeable to OT treatment to address goals for LB dressing, grooming, and toilet transfers. Patient declined use of RW or cane stating she uses furniture at home due to not enough space for RW. Patient performed mobility and transfers with CGA for safety. Patient able to perform LB dressing with changing socks with supervision. Discharge recommendations continue to be appropriate with possible discharge home if patient improves with mobility.       If plan is discharge home, recommend the following:  A little help with walking and/or transfers;A little help with bathing/dressing/bathroom;Assist for transportation;Direct supervision/assist for financial management;Direct supervision/assist for medications management   Equipment Recommendations  None recommended by OT    Recommendations for Other Services      Precautions / Restrictions Precautions Precautions: Fall Restrictions Weight Bearing Restrictions Per Provider Order: No       Mobility Bed Mobility Overal bed mobility: Needs Assistance Bed Mobility: Supine to Sit, Sit to Supine     Supine to sit: Supervision Sit to supine: Supervision   General bed mobility comments: able to manage without physical assistance    Transfers Overall transfer level: Needs assistance Equipment used: None Transfers: Sit to/from Stand Sit to Stand: Contact guard assist           General transfer comment: CGA for mobilty to sink and bathroom and for  functional transfers. Declined RW or cane     Balance Overall balance assessment: Needs assistance Sitting-balance support: No upper extremity supported, Feet supported Sitting balance-Leahy Scale: Fair Sitting balance - Comments: EOB   Standing balance support: During functional activity, Reliant on assistive device for balance, Single extremity supported, No upper extremity supported Standing balance-Leahy Scale: Poor Standing balance comment: reliant on UE support but believes she doesn't need an AD                           ADL either performed or assessed with clinical judgement   ADL Overall ADL's : Needs assistance/impaired     Grooming: Set up;Standing Grooming Details (indicate cue type and reason): at sink             Lower Body Dressing: Supervision/safety;Sitting/lateral leans Lower Body Dressing Details (indicate cue type and reason): changed socks Toilet Transfer: Contact guard Armed forces operational officer Details (indicate cue type and reason): ambulated to bathroom with occasional support from sink, patient declined walker or cane           General ADL Comments: limited participation due to pain    Extremity/Trunk Assessment              Vision       Perception     Praxis     Communication Communication Communication: Impaired Factors Affecting Communication: Reduced clarity of speech   Cognition Arousal: Alert Behavior During Therapy: WFL for tasks assessed/performed Cognition: No family/caregiver present to determine baseline                               Following  commands: Intact        Cueing   Cueing Techniques: Verbal cues  Exercises      Shoulder Instructions       General Comments VSS on RA    Pertinent Vitals/ Pain       Pain Assessment Pain Assessment: Faces Faces Pain Scale: Hurts even more Pain Location: right knee Pain Descriptors / Indicators: Numbness, Tingling, Pins and  needles Pain Intervention(s): Limited activity within patient's tolerance, Monitored during session, Repositioned, Heat applied  Home Living                                          Prior Functioning/Environment              Frequency  Min 2X/week        Progress Toward Goals  OT Goals(current goals can now be found in the care plan section)  Progress towards OT goals: Progressing toward goals  Acute Rehab OT Goals Patient Stated Goal: to go home OT Goal Formulation: With patient Time For Goal Achievement: 10/29/23 Potential to Achieve Goals: Fair ADL Goals Pt Will Perform Grooming: with modified independence;standing Pt Will Perform Lower Body Dressing: with modified independence;sit to/from stand Pt Will Transfer to Toilet: with modified independence;ambulating Additional ADL Goal #1: Pt will demonstrate independence with 3 fall prevention strategies.  Plan      Co-evaluation                 AM-PAC OT 6 Clicks Daily Activity     Outcome Measure   Help from another person eating meals?: None Help from another person taking care of personal grooming?: A Little Help from another person toileting, which includes using toliet, bedpan, or urinal?: A Little Help from another person bathing (including washing, rinsing, drying)?: A Little Help from another person to put on and taking off regular upper body clothing?: A Little Help from another person to put on and taking off regular lower body clothing?: A Little 6 Click Score: 19    End of Session Equipment Utilized During Treatment: Gait belt  OT Visit Diagnosis: Other abnormalities of gait and mobility (R26.89);Muscle weakness (generalized) (M62.81);Other symptoms and signs involving cognitive function   Activity Tolerance Patient limited by pain   Patient Left in bed;with call bell/phone within reach   Nurse Communication Mobility status        Time: 8955-8941 OT Time  Calculation (min): 14 min  Charges: OT General Charges $OT Visit: 1 Visit OT Treatments $Self Care/Home Management : 8-22 mins  Dick Byrd, OTA Acute Rehabilitation Services  Office [phone]   Christina Byrd 10/19/2023, 2:12 PM

## 2023-10-19 NOTE — Progress Notes (Signed)
 Mobility Specialist: Progress Note   10/19/23 1600  Mobility  Activity Ambulated with assistance  Level of Assistance Contact guard assist, steadying assist  Assistive Device Other (Comment) (HHA)  Distance Ambulated (ft) 90 ft  Activity Response Tolerated well  Mobility Referral Yes  Mobility visit 1 Mobility  Mobility Specialist Start Time (ACUTE ONLY) 1023  Mobility Specialist Stop Time (ACUTE ONLY) 1035  Mobility Specialist Time Calculation (min) (ACUTE ONLY) 12 min    Pt received in bed, agreeable to mobility session. Declined AD despite encouragement. CGA throughout with HHA. Distance limited d/t R knee pain. Returned to room without fault. Left in bed with all needs met, call bell in reach.   Ileana Lute Mobility Specialist Please contact via SecureChat or Rehab office at [phone]

## 2023-10-20 ENCOUNTER — Other Ambulatory Visit (HOSPITAL_COMMUNITY): Payer: Self-pay

## 2023-10-20 ENCOUNTER — Encounter (HOSPITAL_COMMUNITY): Payer: Self-pay | Admitting: Internal Medicine

## 2023-10-20 LAB — GLUCOSE, CAPILLARY
Glucose-Capillary: 70 mg/dL (ref 70–99)
Glucose-Capillary: 125 mg/dL — ABNORMAL HIGH (ref 70–99)
Glucose-Capillary: 106 mg/dL — ABNORMAL HIGH (ref 70–99)

## 2023-10-20 MED ORDER — ALLOPURINOL 100 MG PO TABS
100.0000 mg | ORAL_TABLET | Freq: Every day | ORAL | 0 refills | Status: DC
  Filled 2023-10-20 (×2): qty 60, 60d supply, fill #0

## 2023-10-20 MED ORDER — LEVETIRACETAM 750 MG PO TABS
750.0000 mg | ORAL_TABLET | Freq: Two times a day (BID) | ORAL | 2 refills | Status: DC
  Filled 2023-10-20 (×2): qty 60, 30d supply, fill #0

## 2023-10-20 MED ORDER — MELATONIN 3 MG PO TABS: 3.0000 mg | ORAL_TABLET | Freq: Every day | ORAL | Status: DC

## 2023-10-20 MED ORDER — NALTREXONE HCL 50 MG PO TABS
50.0000 mg | ORAL_TABLET | Freq: Every day | ORAL | 1 refills | Status: DC
  Filled 2023-10-20: qty 30, 30d supply, fill #0

## 2023-10-20 MED ORDER — PANTOPRAZOLE SODIUM 40 MG PO TBEC: 40.0000 mg | DELAYED_RELEASE_TABLET | Freq: Every day | ORAL | Status: DC

## 2023-10-20 MED ORDER — GABAPENTIN 100 MG PO CAPS
100.0000 mg | ORAL_CAPSULE | Freq: Three times a day (TID) | ORAL | 2 refills | Status: DC
  Filled 2023-10-20 (×2): qty 90, 30d supply, fill #0

## 2023-10-20 MED ORDER — LEVOTHYROXINE SODIUM 25 MCG PO TABS
25.0000 ug | ORAL_TABLET | Freq: Every day | ORAL | 3 refills | Status: DC
  Filled 2023-10-20: qty 60, 60d supply, fill #0

## 2023-10-20 MED ORDER — THIAMINE HCL 100 MG PO TABS
100.0000 mg | ORAL_TABLET | Freq: Every day | ORAL | 0 refills | Status: DC
Start: 2023-10-20 — End: 2023-12-08
  Filled 2023-10-20: qty 30, 30d supply, fill #0

## 2023-10-20 MED ORDER — ATORVASTATIN CALCIUM 20 MG PO TABS
20.0000 mg | ORAL_TABLET | Freq: Every day | ORAL | 3 refills | Status: AC
  Filled 2023-10-20: qty 90, 90d supply, fill #0

## 2023-10-20 MED ORDER — DULOXETINE HCL 30 MG PO CPEP
30.0000 mg | ORAL_CAPSULE | Freq: Every day | ORAL | 2 refills | Status: AC
  Filled 2023-10-20 (×2): qty 30, 30d supply, fill #0

## 2023-10-20 NOTE — Progress Notes (Signed)
 Physical Therapy Treatment Patient Details Name: Christina Byrd MRN: [id number] DOB: 09/25/1965 Today's Date: 10/20/2023   History of Present Illness 58 y.o. female [facility]/14/23 with AMS. Admitted with severe malnutrition w/ hypoglycemia and electrolyte disturbances. CIWA precautions. PMHx: CKD 3b, OA, alcohol abuse, alcoholic pancreatitis, seizure disorder, Wernicke's encephalopathy.    PT Comments  RN in room finishing administration of morning meds on PT entry. RN and PT educated pt on use of assistive device to help steady her with mobility. Pt reports that she wants to be able to walk on her own and does not want to be dependent on anything. Pt agreeable to walking with PT with gait belt in place. Pt reports PT can help her with gait belt if she is unsteady. Pt able to ambulate about 40 feet without assist, after which she requires use of handrail in hallway and light min A for portion of hallway where hand rail not present. Pt left sitting EoB. PT encouraged cane for home. Pt states I'm a little scared about falling, but I'll be alright     If plan is discharge home, recommend the following: A little help with walking and/or transfers;A little help with bathing/dressing/bathroom;Assist for transportation;Help with stairs or ramp for entrance   Can travel by private vehicle     Yes  Equipment Recommendations  Other (comment) (Pt reports she has Rollator to use in the community and nothing will fit in her home so she will continue to use furniture for support)       Precautions / Restrictions Precautions Precautions: Fall Restrictions Weight Bearing Restrictions Per Provider Order: No     Mobility  Bed Mobility               General bed mobility comments: sitting EoB on entry    Transfers Overall transfer level: Needs assistance Equipment used: None Transfers: Sit to/from Stand Sit to Stand: Contact guard assist           General transfer comment:  CGA for safety, refuses any AD    Ambulation/Gait Ambulation/Gait assistance: Contact guard assist, Min assist Gait Distance (Feet): 80 Feet Assistive device: None Gait Pattern/deviations: Step-through pattern, Decreased stride length, Trunk flexed Gait velocity: decr Gait velocity interpretation: <1.31 ft/sec, indicative of household ambulator   General Gait Details: pt refuses AD, or HHA but states you can help me with your belt, if I need it         Balance Overall balance assessment: Needs assistance Sitting-balance support: No upper extremity supported, Feet supported Sitting balance-Leahy Scale: Fair Sitting balance - Comments: EOB   Standing balance support: During functional activity, Reliant on assistive device for balance, Single extremity supported, No upper extremity supported Standing balance-Leahy Scale: Poor Standing balance comment: reliant on UE support but believes she doesn't need an AD                            Communication Communication Communication: Impaired Factors Affecting Communication: Reduced clarity of speech  Cognition Arousal: Alert Behavior During Therapy: WFL for tasks assessed/performed                             Following commands: Intact      Cueing Cueing Techniques: Verbal cues  Exercises      General Comments General comments (skin integrity, edema, etc.): despite maximal education on use of AD pt refuses use  of cane, RW and Rollator, stating I want to walk on my own pt with reports of dizziness with increasing distance however unable to make correlation of safety with use of AD      Pertinent Vitals/Pain Pain Assessment Pain Assessment: Faces Faces Pain Scale: Hurts even more Pain Location: right knee Pain Descriptors / Indicators: Numbness, Tingling, Pins and needles Pain Intervention(s): Limited activity within patient's tolerance, Monitored during session, Repositioned     PT Goals  (current goals can now be found in the care plan section) Acute Rehab PT Goals PT Goal Formulation: With patient Time For Goal Achievement: 10/30/23 Potential to Achieve Goals: Good Progress towards PT goals: Progressing toward goals    Frequency    Min 2X/week       AM-PAC PT 6 Clicks Mobility   Outcome Measure  Help needed turning from your back to your side while in a flat bed without using bedrails?: None Help needed moving from lying on your back to sitting on the side of a flat bed without using bedrails?: None Help needed moving to and from a bed to a chair (including a wheelchair)?: A Little Help needed standing up from a chair using your arms (e.g., wheelchair or bedside chair)?: A Little Help needed to walk in hospital room?: A Little Help needed climbing 3-5 steps with a railing? : A Lot 6 Click Score: 19    End of Session Equipment Utilized During Treatment: Gait belt Activity Tolerance: Patient tolerated treatment well Patient left: in bed;with call bell/phone within reach;with bed alarm set Nurse Communication: Mobility status PT Visit Diagnosis: Other abnormalities of gait and mobility (R26.89);Muscle weakness (generalized) (M62.81);History of falling (Z91.81)     Time: 1000-1015 PT Time Calculation (min) (ACUTE ONLY): 15 min  Charges:    $Gait Training: 8-22 mins PT General Charges $$ ACUTE PT VISIT: 1 Visit                     Christina Byrd B. Fleeta Lapidus PT, DPT Acute Rehabilitation Services Please use secure chat or  Call Office [phone]    Christina Byrd Fleeta [facility] 10/20/2023, 11:47 AM

## 2023-10-20 NOTE — Plan of Care (Signed)
  Problem: Activity: Goal: Risk for activity intolerance will decrease Outcome: Progressing   Problem: Coping: Goal: Level of anxiety will decrease Outcome: Progressing   Problem: Pain Managment: Goal: General experience of comfort will improve and/or be controlled Outcome: Progressing   Problem: Safety: Goal: Ability to remain free from injury will improve Outcome: Not Progressing

## 2023-10-20 NOTE — Plan of Care (Signed)

## 2023-10-21 ENCOUNTER — Telehealth: Payer: Self-pay

## 2023-10-21 ENCOUNTER — Other Ambulatory Visit (HOSPITAL_COMMUNITY): Payer: Self-pay

## 2023-10-21 NOTE — Transitions of Care (Post Inpatient/ED Visit) (Signed)
 10/21/2023  Name: Christina Byrd MRN: [id number] DOB: [birth year]  Today's TOC FU Call Status: Today's TOC FU Call Status:: Successful TOC FU Call Completed TOC FU Call Complete Date: 10/21/23 Patient's Name and Date of Birth confirmed.  Transition Care Management Follow-up Telephone Call Date of Discharge: 10/20/23 Discharge Facility: [facility]) Type of Discharge: Inpatient Admission Primary Inpatient Discharge Diagnosis:: encephalopathy How have you been since you were released from the hospital?: Better Any questions or concerns?: No  Items Reviewed: Did you receive and understand the discharge instructions provided?: Yes Medications obtained,verified, and reconciled?: Yes (Medications Reviewed) Any new allergies since your discharge?: No Dietary orders reviewed?: Yes Do you have support at home?: Yes People in Home [RPT]: spouse  Medications Reviewed Today: Medications Reviewed Today     Reviewed by Emmitt Pan, LPN (Licensed Practical Nurse) on 10/21/23 at [phone]  Med List Status: <None>   Medication Order Taking? Sig Documenting Provider Last Dose Status Informant  acetaminophen  (TYLENOL ) 325 MG tablet [id number] Yes Take 2 tablets (650 mg total) by mouth every 6 (six) hours as needed for mild pain (pain score 1-3) or fever (or Fever >/= 101). Kandis Perkins, DO  Active Self  allopurinol  (ZYLOPRIM ) 100 MG tablet [id number] Yes Take 1 tablet (100 mg total) by mouth daily. Shawn Sick, MD  Active   atorvastatin  (LIPITOR) 20 MG tablet [id number] Yes Take 1 tablet (20 mg total) by mouth daily. Shawn Sick, MD  Active   cetirizine  (ZYRTEC  ALLERGY) 10 MG tablet [id number]  Take 1 tablet (10 mg total) by mouth daily.  Patient not taking: Reported on 10/21/2023   Harrie Bruckner, DO  Active Self  DULoxetine  (CYMBALTA ) 30 MG capsule [id number] Yes Take 1 capsule (30 mg total) by mouth daily. Shawn Sick, MD  Active   feeding supplement (ENSURE ENLIVE / ENSURE PLUS)  LIQD [id number]  Take 237 mLs by mouth 3 (three) times daily between meals.  Patient not taking: Reported on 10/21/2023   Fernand Prost, MD  Active Self  folic acid  (FOLVITE ) 1 MG tablet [id number] Yes Take 1 tablet (1 mg total) by mouth daily. Tobie Gaines, DO  Active   gabapentin  (NEURONTIN ) 100 MG capsule [id number] Yes Take 1 capsule (100 mg total) by mouth 3 (three) times daily. Shawn Sick, MD  Active   levETIRAcetam  (KEPPRA ) 750 MG tablet [id number] Yes Take 1 tablet (750 mg total) by mouth 2 (two) times daily. Shawn Sick, MD  Active   levothyroxine  (SYNTHROID ) 25 MCG tablet [id number] Yes Take 1 tablet (25 mcg total) by mouth daily before breakfast. Shawn Sick, MD  Active   magnesium  oxide (MAG-OX) 400 (240 Mg) MG tablet [id number]  Take 1 tablet (400 mg total) by mouth daily.  Patient not taking: Reported on 10/21/2023   Kandis Perkins, DO  Active Self  melatonin 3 MG TABS tablet [id number] Yes Take 1 tablet (3 mg total) by mouth at bedtime. Norrine Sharper, MD  Active     Discontinued 04/02/20 1650 (Completed Course)   Multiple Vitamin (MULTIVITAMIN WITH MINERALS) TABS tablet [id number]  Take 1 tablet by mouth daily.  Patient not taking: Reported on 10/21/2023   Harrie Bruckner, DO  Active Self  naltrexone  (DEPADE) 50 MG tablet [id number] Yes Take 1 tablet (50 mg total) by mouth daily. D'Mello, Rosalyn, DO  Active   pantoprazole  (PROTONIX ) 40 MG tablet [id number] Yes Take 1 tablet (40 mg total) by mouth daily. Norrine Sharper, MD  Active   thiamine  (VITAMIN B1) 100 MG tablet [id number]  Yes Take 1 tablet (100 mg total) by mouth daily. D'Mello, Rosalyn, DO  Active   Med List Note Steffi, [place], CPhT 08/26/23 1228): Multiple social barriers impact patients compliance/accurate dispense history which may lead to inaccuracy of what the patient should or should not be taking at home.             Home Care and Equipment/Supplies: Were Home Health Services Ordered?: NA Any new  equipment or medical supplies ordered?: NA  Functional Questionnaire: Do you need assistance with bathing/showering or dressing?: No Do you need assistance with meal preparation?: No Do you need assistance with eating?: No Do you have difficulty maintaining continence: No Do you need assistance with getting out of bed/getting out of a chair/moving?: No Do you have difficulty managing or taking your medications?: No  Follow up appointments reviewed: PCP Follow-up appointment confirmed?: Yes Date of PCP follow-up appointment?: 10/27/23 Follow-up Provider: [facility] Follow-up appointment confirmed?: NA Do you need transportation to your follow-up appointment?: No Do you understand care options if your condition(s) worsen?: Yes-patient verbalized understanding    SIGNATURE Julian Lemmings, LPN [facility] Nurse Health Advisor Direct Dial [phone]

## 2023-10-27 ENCOUNTER — Encounter: Payer: Self-pay | Admitting: Student

## 2023-10-27 ENCOUNTER — Ambulatory Visit: Admitting: Student

## 2023-10-27 ENCOUNTER — Other Ambulatory Visit: Payer: Self-pay

## 2023-10-27 VITALS — BP 126/84 | HR 92 | Temp 98.4°F | Ht 66.0 in | Wt 109.0 lb

## 2023-10-27 DIAGNOSIS — R5381 Other malaise: Secondary | ICD-10-CM | POA: Diagnosis not present

## 2023-10-27 DIAGNOSIS — Z23 Encounter for immunization: Secondary | ICD-10-CM | POA: Diagnosis not present

## 2023-10-27 DIAGNOSIS — M1990 Unspecified osteoarthritis, unspecified site: Secondary | ICD-10-CM

## 2023-10-27 DIAGNOSIS — F1010 Alcohol abuse, uncomplicated: Secondary | ICD-10-CM | POA: Diagnosis not present

## 2023-10-27 DIAGNOSIS — E46 Unspecified protein-calorie malnutrition: Secondary | ICD-10-CM | POA: Diagnosis not present

## 2023-10-27 MED ORDER — DICLOFENAC SODIUM 1 % EX GEL
4.0000 g | Freq: Four times a day (QID) | CUTANEOUS | 3 refills | Status: DC
Start: 2023-10-27 — End: 2024-01-05

## 2023-10-27 MED ORDER — NALTREXONE HCL 50 MG PO TABS: 50.0000 mg | ORAL_TABLET | Freq: Every day | ORAL | 1 refills | Status: AC

## 2023-10-27 NOTE — Assessment & Plan Note (Signed)
 No alcohol use since the end of August 2025.  Recommend continued supplementation of multivitamins and B vitamins. -Naltrexone  per above

## 2023-10-27 NOTE — Progress Notes (Signed)
 CC: Hospital follow-up altered mental status due to electrolyte and metabolic derangements due to severe malnutrition  HPI:  Christina Byrd is a 58 y.o. female with a PMH stated below who presents today for follow-up.  She has no specific plaints or requests.  She wants to go over her medicines.  Please see problem based assessment and plan for additional details.  Past Medical History:  Diagnosis Date   GASTROESOPHAGEAL REFLUX, NO ESOPHAGITIS 04/21/2006   Qualifier: Diagnosis of  By: WATT, JOANNE     GERD (gastroesophageal reflux disease) Dx 1995   Hot flash, menopausal 06/22/2016   Hot flashes 01/30/2020   HTN (hypertension)    Hypotension 10/21/2022   Patient's blood pressure at presentation was 87/65, follow up blood pressure was 81/58. Patient reports regurgitating everything she has taken po for the past month. Denies lightheadedness or dizziness. Discussed GI referral, though patient stated she would be going to the emergency room today for IV fluids.   -GI referral for upper endoscopy placed  -Patient planning on going to the ED for IV flu   Intertrigo 06/22/2016   Nausea & vomiting 12/23/2022   Nausea and vomiting 12/24/2022   Pain and swelling of left ankle 12/02/2014   Seizures (HCC)    Tendinitis of right hip flexor 07/23/2014   Tendonitis, Achilles, right 04/01/2014    Review of Systems: ROS negative except for what is noted on the assessment and plan.  Vitals:   10/27/23 1421  BP: 126/84  Pulse: 92  Temp: 98.4 F (36.9 C)  TempSrc: Oral  SpO2: 99%  Weight: 109 lb (49.4 kg)  Height: 5' 6 (1.676 m)    Physical Exam: Constitutional: chronically ill-appearing woman in wheelchair in no acute distress Eyes: conjunctiva non-erythematous Cardiovascular: regular rate and rhythm, no m/r/g Pulmonary/Chest: normal work of breathing on room air, lungs clear to auscultation bilaterally Abdominal: soft, non-tender, non-distended MSK: mild  cachexia Neurological: alert & oriented x 3, no focal deficit Skin: warm and dry Psych: normal mood and behavior  Assessment & Plan:   Patient discussed with Dr. Karna  Malnutrition [facility]) Today is hospital follow-up for cute on chronic malnutrition that prompted an episode of altered mental status secondary to hypoglycemia and electrolyte abnormalities all ultimately due to chronic alcoholic gastritis.  I have seen her for this a few times this year.  Over the last several months alcohol use has promoted a constant level of stomach discomfort and she had been losing weight.  Fortunately she progressed well through her hospital course and is doing better now than I have yet seen her this year.  Her appetite is back and she is eating well.  There is no more recurrent nausea or emesis.  She has not had any alcohol since she was discharged from the hospital despite, I do not believe actually taking her naltrexone .  I do not have any acute concerns today.  She refuses lab draw so I will not be able to check any labs today.  -I will refill her naltrexone  and recommend daily use of the 50 mg dose -Prolonged alcohol abstinence is what she needs to improve her caloric intake and regain strength  Physical deconditioning Acute on chronic deconditioning secondary to the above.  She refuses physical therapy referral here or in the hospital, she does not think it would help her.  However her husband and her are being very diligent at home, pushing herself, stretching and exercising and walking as far she can with the  assistance of a walker. - Happy to refer to physical therapy if she wants it  ETOH abuse No alcohol use since the end of August 2025.  Recommend continued supplementation of multivitamins and B vitamins. 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 - Voltaren  gel as needed  Mammogram offered today but she refuses.  RTC in  1 month for a general health maintenance visit.  Lonni Africa, D.O. [facility] Internal Medicine, PGY-2 Phone: [phone] Date 10/27/2023 Time 5:13 PM

## 2023-10-27 NOTE — Assessment & Plan Note (Signed)
 Chronic arthritis of multiple joints including the knees and shoulders. - Voltaren  gel as needed

## 2023-10-27 NOTE — Assessment & Plan Note (Signed)
 Acute on chronic deconditioning secondary to the above.  She refuses physical therapy referral here or in the hospital, she does not think it would help her.  However her husband and her are being very diligent at home, pushing herself, stretching and exercising and walking as far she can with the assistance of a walker. - Happy to refer to physical therapy if she wants it

## 2023-10-27 NOTE — Patient Instructions (Addendum)
 I will send naltrexone  to [facility]. I recommend taking this daily for the next several weeks.  Continue to eat well and work on your strength. If you have concerns about these things, contact our clinic.  Please return for a general checkup in 1 month.

## 2023-10-27 NOTE — Assessment & Plan Note (Addendum)
 Today is hospital follow-up for cute on chronic malnutrition that prompted an episode of altered mental status secondary to hypoglycemia and electrolyte abnormalities all ultimately due to chronic alcoholic gastritis.  I have seen her for this a few times this year.  Over the last several months alcohol use has promoted a constant level of stomach discomfort and she had been losing weight.  Fortunately she progressed well through her hospital course and is doing better now than I have yet seen her this year.  Her appetite is back and she is eating well.  There is no more recurrent nausea or emesis.  She has not had any alcohol since she was discharged from the hospital despite, I do not believe actually taking her naltrexone .  I do not have any acute concerns today.  She refuses lab draw so I will not be able to check any labs today.  -I will refill her naltrexone  and recommend daily use of the 50 mg dose -Prolonged alcohol abstinence is what she needs to improve her caloric intake and regain strength

## 2023-10-28 NOTE — Progress Notes (Signed)
 Internal Medicine Clinic Attending  Case discussed with the resident at the time of the visit.  We reviewed the resident's history and exam and pertinent patient test results.  I agree with the assessment, diagnosis, and plan of care documented in the resident's note. Declines labs today. Weight improving.

## 2023-11-03 ENCOUNTER — Telehealth: Payer: Self-pay | Admitting: *Deleted

## 2023-11-03 NOTE — Telephone Encounter (Signed)
 Copied from CRM #[id number]. Topic: Clinical - Medical Advice >> Nov 03, 2023 10:51 AM Farrel B wrote: Reason for CRM: [id number] patient is check on when pneumonia vac is due please call and advise

## 2023-11-07 ENCOUNTER — Ambulatory Visit (INDEPENDENT_AMBULATORY_CARE_PROVIDER_SITE_OTHER): Payer: Self-pay | Admitting: Student

## 2023-11-07 ENCOUNTER — Encounter: Payer: Self-pay | Admitting: Student

## 2023-11-07 ENCOUNTER — Other Ambulatory Visit: Payer: Self-pay

## 2023-11-07 VITALS — BP 130/87 | HR 104 | Temp 99.1°F | Ht 66.0 in | Wt 99.8 lb

## 2023-11-07 DIAGNOSIS — E46 Unspecified protein-calorie malnutrition: Secondary | ICD-10-CM

## 2023-11-07 NOTE — Progress Notes (Unsigned)
   Established Patient Office Visit  Subjective   Patient ID: Christina Byrd, female    DOB: 08-09-1965  Age: 58 y.o. MRN: [id number]  Chief Complaint  Patient presents with   Acute Visit   Follow-up    Follow up from office  visit    Christina Byrd is a 58 y.o.   Last OV 10/27/23 pertaining for hospital follow up   Review of Systems:  As per assessment and Plan   Objective:     Vitals:   11/07/23 1600  BP: 130/87  Pulse: (!) 104  Temp: 99.1 F (37.3 C)  TempSrc: Oral  SpO2: 97%  Weight: 99 lb 12.8 oz (45.3 kg)  Height: 5' 6 (1.676 m)    Physical Exam General: Sitting in chair, no acute distress Cardiovascular: Regular rate Pulmonary: Breathing comfortably Abdomen: Soft, nontender, nondistended MSK: No lower extremity edema bilaterally  {Labs (Optional):23779}  The 10-year ASCVD risk score (Arnett DK, et al., 2019) is: 6%    Assessment & Plan:   Patient {GC/GE:3044014::discussed with,seen with} Dr. {NAMES:[id number]::Chambliss,Chun,Hoffman,Lau,Machen,Narendra,Williams,Winfrey}  No charge, patient left before seen.   Problem List Items Addressed This Visit   None   No follow-ups on file.    Toma Edwards, DO

## 2023-11-14 ENCOUNTER — Telehealth: Payer: Self-pay | Admitting: *Deleted

## 2023-11-14 NOTE — Telephone Encounter (Signed)
 Call to pharmacy sated  that they have the entire 30 pills and patient could not afford as it is not covered by her new insurance.  Patient stated that she does not have a new insurance and was told by Pharmacy that they did not have all of the medication in stock.  Patient was asked to have prescription sent to another pharmacy if her pharmacy does  not have enough medication.  Will call pharmacy now to see.   Copied from CRM [id number]. Topic: Clinical - Prescription Issue >> Nov 14, 2023 11:53 AM Mercer PEDLAR wrote: Reason for CRM: Patient is calling regarding prescription for naltrexone  (DEPADE) 50 MG tablet. She stated that the pharmacy does not have the entire amount of pills needed to fill the prescription. Patient is asking if she can pickup a partial prescription until the fill amount is available.

## 2023-11-15 ENCOUNTER — Other Ambulatory Visit (HOSPITAL_COMMUNITY): Payer: Self-pay

## 2023-11-16 ENCOUNTER — Telehealth: Payer: Self-pay | Admitting: *Deleted

## 2023-11-16 NOTE — Telephone Encounter (Signed)
 Unable to contact April as no number was given for return call. Copied from CRM #[id number]. Topic: Clinical - Order For Equipment >> Nov 16, 2023 12:32 PM Alfonso ORN wrote: Reason for CRM: April calling from  home care delivery medical supply company and  requesting diabetic supplies for the patient ,  however the provider have not diagnois pt. as been diabetic , pt want to check her blood sugar daily

## 2023-11-17 NOTE — Telephone Encounter (Signed)
 Call to pharmacy.  Patient's DePade has been awaiting pick up since the 9/19. No co-pay was seen on medication per Pharmacy Tech.  Patient was called and informed of  and will go to Pharmacy to pick up medication.

## 2023-11-21 ENCOUNTER — Ambulatory Visit: Payer: Self-pay

## 2023-11-21 NOTE — Progress Notes (Deleted)
   Established Patient Office Visit  Subjective   Patient ID: Christina Byrd, female    DOB: [birth year]  Age: 58 y.o. MRN: [id number]  No chief complaint on file.   HPI Christina Byrd is a 58 year old female with past medical history of alcohol use disorder, GERD, protein calorie malnutrition who presents today for follow-up {History (Optional):23778}  ROS    Objective:     LMP 08/23/2011  {Vitals History (Optional):23777}  Physical Exam   No results found for any visits on 11/21/23.  {Labs (Optional):23779}  The 10-year ASCVD risk score (Arnett DK, et al., 2019) is: 6%    Assessment & Plan:   Problem List Items Addressed This Visit   None  Malnutrition  Physical decondition- PT   Alcohol use disorder- naltrexone  50 mg   Health care maintenance - mammogram -pneumonia vaccine   Hypothyroidism  Stable on levothyroxine  25 No follow-ups on file.    Massie Mees D'Mello, DO

## 2023-11-21 NOTE — Telephone Encounter (Signed)
 Christina Byrd is calling because she has not heard back. Transferred to clinic.

## 2023-11-22 ENCOUNTER — Ambulatory Visit: Payer: Self-pay

## 2023-11-22 NOTE — Telephone Encounter (Signed)
 FYI Only or Action Required?: FYI only for provider.  Patient was last seen in primary care on 11/07/2023 by Christina Barren, DO.  Called Nurse Triage reporting Vomiting.  Symptoms began about a month ago.  Interventions attempted: Rest, hydration, or home remedies.  Symptoms are: gradually worsening.  Triage Disposition: Go to ED Now (or PCP Triage)  Patient/caregiver understands and will follow disposition?: Yes   Copied from CRM #[id number]. Topic: Clinical - Red Word Triage >> Nov 22, 2023  4:00 PM Fredrica W wrote: Red Word that prompted transfer to Nurse Triage: Weakness, nausea vomitting, up all night - unable to keep anything down, everything taste like salt even water  and salt. Tired of going to hospital. Wants to know if she can take pepto. Reason for Disposition  [1] SEVERE vomiting (e.g., 6 or more times/day) AND [2] present > 8 hours (Exception: Patient sounds well, is drinking liquids, does not sound dehydrated, and vomiting has lasted less than 24 hours.)  Answer Assessment - Initial Assessment Questions Pt admitted from 8/22-8/28 for electrolyte imbalances and alcohol ketoacidosis, still admits etoh since then. Has been vomiting since dc. Pt admits to diarrhea for last 24 hours. States everything tastes salty. Pt high risk for dehydration given hx and current sxs. States unable to keep anything down including medications or fluids. RN educated on importance of ED evaluation d/t severity of sxs.   1. VOMITING SEVERITY: How many times have you vomited in the past 24 hours?      Pt unable to count  2. ONSET: When did the vomiting begin?      1 month  3. FLUIDS: What fluids or food have you vomited up today? Have you been able to keep any fluids down?     Unable to keep down 4. ABDOMEN PAIN: Are your having any abdomen pain? If Yes : How bad is it and what does it feel like? (e.g., crampy, dull, intermittent, constant)      generalized 5. DIARRHEA: Is there any  diarrhea? If Yes, ask: How many times today?      Admits; all night long 6. CONTACTS: Is there anyone else in the family with the same symptoms?      denies 7. CAUSE: What do you think is causing your vomiting?     unsure 8. HYDRATION STATUS: Any signs of dehydration? (e.g., dry mouth [not only dry lips], too weak to stand) When did you last urinate?     Dry mouth, everything tastes salty, weak 9. OTHER SYMPTOMS: Do you have any other symptoms? (e.g., fever, headache, vertigo, vomiting blood or coffee grounds, recent head injury)     denies  Protocols used: Vomiting-A-AH

## 2023-11-22 NOTE — Telephone Encounter (Signed)
 Call to patient refuses to go to ER.  Is drinking Gator-Aid and trying to eat a little now.  Patient was advised of the importance of taking fluids.  States would rather drink fluids and see how it goes as she does not want to go to the ER.

## 2023-11-23 NOTE — Telephone Encounter (Signed)
 RTC to patient to check on condition.  Unable to leave message on Voice Mail.

## 2023-11-28 ENCOUNTER — Telehealth: Payer: Self-pay | Admitting: *Deleted

## 2023-11-28 NOTE — Telephone Encounter (Signed)
 Copied from CRM [id number]. Topic: General - Other >> Nov 28, 2023 11:07 AM Debby BROCKS wrote: Reason for CRM: April from [facility] called stating that  Paperwork has been faxed over for Christina Byrd to Sign for the patients Blood pressure monitor on Sept 30th and they wish to follow up if it was received.   Callback: [phone] Fax: [phone]

## 2023-11-29 DIAGNOSIS — I10 Essential (primary) hypertension: Secondary | ICD-10-CM | POA: Diagnosis not present

## 2023-12-05 ENCOUNTER — Ambulatory Visit: Payer: Self-pay

## 2023-12-05 NOTE — Telephone Encounter (Signed)
 FYI Only or Action Required?: Action required by provider: update on patient condition. Gout flare- had to cancel todays appt. Rescheduled to 10/16 per pt request  Patient was last seen in primary care on 11/07/2023 by Heddy Barren, DO.  Called Nurse Triage reporting Pain.  Symptoms began several months ago.  Interventions attempted: Prescription medications: allopurinol .  Symptoms are: gradually worsening.  Triage Disposition: See PCP When Office is Open (Within 3 Days)  Patient/caregiver understands and will follow disposition?:  Copied from CRM #[id number]. Topic: Clinical - Red Word Triage >> Dec 05, 2023  3:09 PM Zane F wrote: Red Word that prompted transfer to Nurse Triage:   Concern: Shoulders and legs are in pain  Symptoms:  Limited mobility  Legs go numb sometimes  Sharp pain shoot through her legs  The bottom of her stomach hurts sometimes ( she believes its a stretched muscle)  When did the symptoms start?: Off and on 2-3 months   What have you done to aid in the concern ? Have you taken anything to assist with the matter?: Yes  If so, what did you take?: Patient takes prescriptions for gout and gabapentin   Wanted to let you know I will be transferring you to further discuss your concern. Please be advised the nurse can assist with scheduling. Reason for Disposition  [1] Numbness or tingling in one or both hands AND [2] is a chronic symptom (recurrent or ongoing AND present > 4 weeks)  Answer Assessment - Initial Assessment Questions Symptoms starting in August intermittently. Pain through her legs and then they will go numb and then give out on her. She has fallen a few times. Skinned her knee. Husband will help her get around the house.  Denies color change, or cold extremities.   Gout will start in her shoulders then migraine to her knees then down to her ankles. She thinks this is a gout flare. She is on allopurinol  but was taken off a different  medication but unable to remember what it was. Has avoided red meat. Took one shot of liquor for husbands birthday and some fish.  ED/UC/Callback instructions given and understood. Patient grateful for the reschedule and will monitor for worsening.   1. SYMPTOM: What is the main symptom you are concerned about? (e.g., weakness, numbness)     Right side worse then left side but both  2. ONSET: When did this start? (e.g., minutes, hours, days; while sleeping)     Comes and goes- lasts about an hour- after she gets back in the bed.  4. PATTERN Does this come and go, or has it been constant since it started?  Is it present now?     More when she is up. Falling.  5. CARDIAC SYMPTOMS: Have you had any of the following symptoms: chest pain, difficulty breathing, palpitations?     Abdominal AVM  6. NEUROLOGIC SYMPTOMS: Have you had any of the following symptoms: headache, dizziness, vision loss, double vision, changes in speech, unsteady on your feet?     denies 7. OTHER SYMPTOMS: Do you have any other symptoms?     Shoulders pop  Protocols used: Neurologic Deficit-A-AH

## 2023-12-08 ENCOUNTER — Encounter: Payer: Self-pay | Admitting: Student

## 2023-12-08 ENCOUNTER — Other Ambulatory Visit: Payer: Self-pay

## 2023-12-08 ENCOUNTER — Ambulatory Visit: Payer: Self-pay | Admitting: Student

## 2023-12-08 VITALS — BP 119/84 | HR 121 | Temp 98.0°F | Ht 66.0 in | Wt 94.6 lb

## 2023-12-08 DIAGNOSIS — M109 Gout, unspecified: Secondary | ICD-10-CM

## 2023-12-08 DIAGNOSIS — T7840XD Allergy, unspecified, subsequent encounter: Secondary | ICD-10-CM

## 2023-12-08 DIAGNOSIS — F1010 Alcohol abuse, uncomplicated: Secondary | ICD-10-CM | POA: Diagnosis not present

## 2023-12-08 DIAGNOSIS — F10188 Alcohol abuse with other alcohol-induced disorder: Secondary | ICD-10-CM

## 2023-12-08 DIAGNOSIS — M1049 Other secondary gout, multiple sites: Secondary | ICD-10-CM | POA: Diagnosis present

## 2023-12-08 DIAGNOSIS — K2990 Gastroduodenitis, unspecified, without bleeding: Secondary | ICD-10-CM | POA: Diagnosis not present

## 2023-12-08 MED ORDER — LEVETIRACETAM 750 MG PO TABS: 750.0000 mg | ORAL_TABLET | Freq: Two times a day (BID) | ORAL | 2 refills | Status: DC

## 2023-12-08 MED ORDER — CETIRIZINE HCL 10 MG PO TABS
10.0000 mg | ORAL_TABLET | Freq: Every day | ORAL | 2 refills | Status: AC
Start: 2023-12-08 — End: 2024-12-07

## 2023-12-08 MED ORDER — THIAMINE HCL 100 MG PO TABS: 100.0000 mg | ORAL_TABLET | Freq: Every day | ORAL | 0 refills | Status: AC

## 2023-12-08 MED ORDER — PANTOPRAZOLE SODIUM 40 MG PO TBEC: 40.0000 mg | DELAYED_RELEASE_TABLET | Freq: Every day | ORAL | 3 refills | Status: DC

## 2023-12-08 MED ORDER — ALLOPURINOL 100 MG PO TABS: 100.0000 mg | ORAL_TABLET | Freq: Every day | ORAL | 0 refills | Status: DC

## 2023-12-08 MED ORDER — ONDANSETRON 4 MG PO TBDP: 4.0000 mg | ORAL_TABLET | Freq: Three times a day (TID) | ORAL | 0 refills | Status: DC | PRN

## 2023-12-08 MED ORDER — FOLIC ACID 1 MG PO TABS: 1.0000 mg | ORAL_TABLET | Freq: Every day | ORAL | 2 refills | Status: AC

## 2023-12-08 MED ORDER — PANTOPRAZOLE SODIUM 40 MG PO TBEC: 40.0000 mg | DELAYED_RELEASE_TABLET | Freq: Every day | ORAL | 3 refills | Status: AC

## 2023-12-08 MED ORDER — PREDNISONE 20 MG PO TABS
ORAL_TABLET | ORAL | 0 refills | Status: AC
Start: 2023-12-08 — End: 2023-12-18

## 2023-12-08 NOTE — Patient Instructions (Addendum)
 Prednisone  is for your gout. Take 40mg  (two pills) daily for 5 days then take 20mg  (one pill) daily for five days. Continue to take allopurinol .  Zofran  (ondansetron ) is for nausea and vomiting. Take it every 8 hours as needed.  Avoid alcohol. Drink fluids as much as you can - soda and Gatorade are fine for you. Eat as able, bland foods or soups if necessary.  If you continue to feel ill, call the clinic or go to the ER for immediate evaluation.

## 2023-12-08 NOTE — Progress Notes (Signed)
   CC: Acute gout and N/V  HPI:  [name] is a 58 y.o. female with a PMH stated below who presents today for evalution.  Please see problem based assessment and plan for additional details.  Past Medical History:  Diagnosis Date   GASTROESOPHAGEAL REFLUX, NO ESOPHAGITIS 04/21/2006   Qualifier: Diagnosis of  By: WATT, JOANNE     GERD (gastroesophageal reflux disease) Dx 1995   Hot flash, menopausal 06/22/2016   Hot flashes 01/30/2020   HTN (hypertension)    Hypotension 10/21/2022   Patient's blood pressure at presentation was 87/65, follow up blood pressure was 81/58. Patient reports regurgitating everything she has taken po for the past month. Denies lightheadedness or dizziness. Discussed GI referral, though patient stated she would be going to the emergency room today for IV fluids.   -GI referral for upper endoscopy placed  -Patient planning on going to the ED for IV flu   Intertrigo 06/22/2016   Nausea & vomiting 12/23/2022   Nausea and vomiting 12/24/2022   Pain and swelling of left ankle 12/02/2014   Seizures (HCC)    Tendinitis of right hip flexor 07/23/2014   Tendonitis, Achilles, right 04/01/2014   Review of Systems: ROS negative except for what is noted on the assessment and plan.  Vitals:   12/08/23 1459  BP: 119/84  Pulse: (!) 121  Temp: 98 F (36.7 C)  TempSrc: Oral  SpO2: 98%  Weight: 94 lb 9.6 oz (42.9 kg)  Height: 5' 6 (1.676 m)   Physical Exam: Constitutional: chronically ill-appearing woman, hunched in wheelchair, holding empty emesis bag, in no acute distress HENT: normocephalic atraumatic, mucous membranes are moist Cardiovascular: fast rate (100-120( and rhythm, no m/r/g Pulmonary/Chest: normal work of breathing on room air, lungs clear to auscultation bilaterally Abdominal: soft, non-tender, non-distended MSK: underweight, near cachectic Neurological: alert & oriented x 3, no focal deficit Skin: warm and dry Psych: normal mood and  behavior  Assessment & Plan:   Patient discussed with Dr. Francesco Assessment & Plan Other secondary acute gout of multiple sites Acute gout flare. Polyarticular. Redness and swelling in R ring finger. Tenderness in bilateral shoulders and knees. Afebrile. In conjunction with restarting alcohol. States it feels like her previous flares. - cont allopurinol  100 daily - Prednisone  40 daily x 5 days then 20 x 5 days Gastritis and duodenitis Acute, but has a history of this. Has resumed drinking brandy. Now N/V x 5 days. Unable to tolerate much liquid or food. Tachycardic here, suspect dehydrated. I recommended lab work but she declined. This issue has resulted in hospitalizations in the past and we discussed this, she does not feel so ill yet. My concern is that if she remains like this for a few days, she will require hospitalization. - Rec stopping alcohol - Zofran  4 q8 prn - Refill pantoprazole  40 daily - Small increments of food and liquid intake ETOH abuse Refill thiamine , folic acid , keppra  750 BID (no recent seizures). 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 She requests to stop gabapentin , which I think is reasonable. She remains on cymbalta  30 daily. Allergy, subsequent encounter Refill Cetirizine .  Lonni Africa, D.O. [facility] Internal Medicine, PGY-2 Phone: [phone] Date 12/08/2023 Time 4:32 PM

## 2023-12-08 NOTE — Assessment & Plan Note (Signed)
 Refill thiamine , folic acid , keppra  750 BID (no recent seizures). 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 She requests to stop gabapentin , which I think is reasonable. She remains on cymbalta  30 daily.

## 2023-12-09 ENCOUNTER — Other Ambulatory Visit: Payer: Self-pay

## 2023-12-09 DIAGNOSIS — M1049 Other secondary gout, multiple sites: Secondary | ICD-10-CM

## 2023-12-09 MED ORDER — ALLOPURINOL 100 MG PO TABS: 100.0000 mg | ORAL_TABLET | Freq: Every day | ORAL | 0 refills | Status: DC

## 2023-12-09 NOTE — Progress Notes (Signed)
 Internal Medicine Clinic Attending  Case discussed with the resident at the time of the visit.  We reviewed the resident's history and exam and pertinent patient test results.  I agree with the assessment, diagnosis, and plan of care documented in the resident's note.

## 2023-12-09 NOTE — Telephone Encounter (Signed)
 Pharmacy requesting a 90 day supply  Medication sent to pharmacy.

## 2023-12-22 ENCOUNTER — Ambulatory Visit: Payer: Self-pay

## 2023-12-22 NOTE — Telephone Encounter (Signed)
 RTC to patient stated feels better now.  Had Diarrhea and vomiting.   Made a Publishing [employer] and feels better.  Would like to get an appointment to discus her losss of appetite and to get a Pneumonia .  Will forward to [employer] to call patientt o schedule her with an appointment as she is concerned about her weight loss.

## 2023-12-22 NOTE — Telephone Encounter (Signed)
 Reason for Triage: Woke up with Diarrhea and vomitting - want to know what's causing it. Does not want to go back to hospital   Placed call to patient-no answer, Recording call cannot be completed as dialed

## 2023-12-22 NOTE — Telephone Encounter (Signed)
 FYI Only or Action Required?: Action required by provider: clinical question for provider and patient is asking if she is due for PNA shot.  Patient was last seen in primary care on 12/08/2023 by Harrie Bruckner, DO.  Called Nurse Triage reporting Advice Only.  Triage Disposition: See PCP When Office is Open (Within 3 Days)  Patient/caregiver understands and will follow disposition?: No, wishes to speak with PCP  Reason for Disposition  Caller requesting an appointment, triage offered and declined  Answer Assessment - Initial Assessment Questions 1. REASON FOR CALL or QUESTION: What is your reason for calling today? or How can I best     Patient called initially having woken up with vomiting and diarrhea. Patient stated that after taking Pepto and having a homemade boilermaker drink she is feeling better. Patient states she has vomited nor has she had any diarrhea. Patient is needing a call back from the office about her PNA shot.  2. CALLER: Document the source of call. (e.g., laboratory staff, caregiver or patient).     patient  Protocols used: PCP Call - No Triage-A-AH

## 2023-12-23 ENCOUNTER — Ambulatory Visit: Payer: Self-pay

## 2023-12-23 NOTE — Telephone Encounter (Signed)
 Received call from On Call provider, Clarita Lease MD. Informed MD that patient was triaged for sore throat, unable to swallow salvia and fluids, and drooling. Patient refused ED.  Dr. Lease reports okay, I know her. I will call her.

## 2023-12-23 NOTE — Telephone Encounter (Signed)
 FYI Only or Action Required?: Action required by provider: request for appointment and refuses ER.  Patient was last seen in primary care on 12/08/2023 by Harrie Bruckner, DO.  Called Nurse Triage reporting Sore Throat.  Symptoms began today.  Interventions attempted: Nothing.  Symptoms are: gradually worsening.  Triage Disposition: Go to ED Now (Notify PCP)  Patient/caregiver understands and will follow disposition?: No   Copied from CRM [id number]. Topic: Clinical - Red Word Triage >> Dec 23, 2023  3:10 PM Chiquita SQUIBB wrote: Red Word that prompted transfer to Nurse Triage: Patient is calling in stating that they used red kerosene the other night instead of the normal one they used and has been feeling like something is stuck in her throat, throwing up and has not been able to eat. Yesterday a pink word was sent for throwing up and diarrhea but her symptoms have changed and gotten worse. Reason for Disposition  [1] Symptoms of blocked esophagus (e.g., can't swallow normal secretions, drooling) AND [2] present now  Answer Assessment - Initial Assessment Questions Additional info:  1) Refusing ER. Wants pcp appointment. Advised patient no food or drink until she talks with provider or evaluated at ED.  2) Paged on call provider for internal medicine. Pending call back to make aware of ED refusal.     1. DESCRIPTION: Tell me more about this problem. Are you  having trouble swallowing liquids, solids, or both? Any trouble with swallowing saliva (spit)?     Unable to swallow solids  2. SEVERITY: How bad is the swallowing difficulty?  (Scale 1-10; or mild, moderate, severe)     Feels like something is stuck  3. ONSET: When did the swallowing problems begin?      This morning  4. CAUSE: What do you think is causing the problem?  (e.g., dry mouth, food or pill stuck in throat, mouth pain, sore throat, progression of disease process such as dementia or Parkinson's disease).       Used red kerosine in the home 5. CHRONIC or RECURRENT: Is this a new problem for you?  If No, ask: How long have you had this problem? (e.g., days, weeks, months)      no 6. OTHER SYMPTOMS: Do you have any other symptoms? (e.g., chest pain, difficulty breathing, mouth sores, sore throat, swollen tongue, chest pain)     Feels like lump in throat, vomiting yesterday. Voice strained.  Protocols used: Swallowing Difficulty-A-AH

## 2023-12-26 ENCOUNTER — Ambulatory Visit: Payer: Self-pay

## 2023-12-26 NOTE — Telephone Encounter (Signed)
 FYI Only or Action Required?: FYI only for provider: ED advised. And refused Patient was last seen in primary care on 12/08/2023 by Harrie Bruckner, DO.  Called Nurse Triage reporting Numbness, Diarrhea, and Vomiting.  Symptoms began several days ago.  Interventions attempted: Nothing.  Symptoms are: gradually worsening.  Triage Disposition: Go to ED Now (or PCP Triage)  Patient/caregiver understands and will follow disposition?: No, refuses disposition  Copied from CRM [id number]. Topic: Clinical - Red Word Triage >> Dec 26, 2023 11:17 AM Graeme ORN wrote: Red Word that prompted transfer to Nurse Triage: Tingling hands, feet, legs, hoarse throat, frequent bathroom usage   ----------------------------------------------------------------------- From previous Reason for Contact - Scheduling: Patient/patient representative is calling to schedule an appointment. Refer to attachments for appointment information. Reason for Disposition  [1] Drinking very little AND [2] dehydration suspected (e.g., no urine > 12 hours, very dry mouth, very lightheaded)  Answer Assessment - Initial Assessment Questions Refused ED, refused EMS  1. DIARRHEA SEVERITY: How bad is the diarrhea? How many more stools have you had in the past 24 hours than normal?      Diarrhea all day off and on 2. ONSET: When did the diarrhea begin?      About three days ago 3. STOOL DESCRIPTION:  How loose or watery is the diarrhea? What is the stool color? Is there any blood or mucous in the stool?     Green, watery 4. VOMITING: Are you also vomiting? If Yes, ask: How many times in the past 24 hours?      Possibly more of a cough 2-3 times per day, dry heave/gagging phlegm only 5. ABDOMEN PAIN: Are you having any abdomen pain? If Yes, ask: What does it feel like? (e.g., crampy, dull, intermittent, constant)      Whole belly hurts, increased pain with bowel movements  7. ORAL INTAKE: If vomiting,  Have you been able to drink liquids? How much liquids have you had in the past 24 hours?     Patient states that she has not had any food in three days and that she cannot keep her food dow 8. HYDRATION: Any signs of dehydration? (e.g., dry mouth [not just dry lips], too weak to stand, dizziness, new weight loss) When did you last urinate?     Weakness, lightheadedness 9. EXPOSURE: Have you traveled to a foreign country recently? Have you been exposed to anyone with diarrhea? Could you have eaten any food that was spoiled?     denies 10. ANTIBIOTIC USE: Are you taking antibiotics now or have you taken antibiotics in the past 2 months?       denies 11. OTHER SYMPTOMS: Do you have any other symptoms? (e.g., fever, blood in stool)       denies  Protocols used: Diarrhea-A-AH

## 2023-12-26 NOTE — Telephone Encounter (Signed)
 Call to patient.  Unable to reach or leave a message.

## 2023-12-27 ENCOUNTER — Other Ambulatory Visit: Payer: Self-pay

## 2023-12-27 ENCOUNTER — Encounter (HOSPITAL_COMMUNITY): Payer: Self-pay

## 2023-12-27 ENCOUNTER — Emergency Department (HOSPITAL_COMMUNITY)

## 2023-12-27 ENCOUNTER — Emergency Department (HOSPITAL_COMMUNITY)
Admission: EM | Admit: 2023-12-27 | Discharge: 2023-12-27 | Disposition: A | Discharge status: Home / Self Care | Attending: Emergency Medicine | Admitting: Emergency Medicine

## 2023-12-27 DIAGNOSIS — G9389 Other specified disorders of brain: Secondary | ICD-10-CM | POA: Diagnosis not present

## 2023-12-27 DIAGNOSIS — R112 Nausea with vomiting, unspecified: Secondary | ICD-10-CM | POA: Diagnosis not present

## 2023-12-27 DIAGNOSIS — R109 Unspecified abdominal pain: Secondary | ICD-10-CM | POA: Insufficient documentation

## 2023-12-27 DIAGNOSIS — D61818 Other pancytopenia: Secondary | ICD-10-CM | POA: Insufficient documentation

## 2023-12-27 DIAGNOSIS — R569 Unspecified convulsions: Secondary | ICD-10-CM | POA: Diagnosis not present

## 2023-12-27 DIAGNOSIS — R000 Tachycardia, unspecified: Secondary | ICD-10-CM | POA: Diagnosis not present

## 2023-12-27 DIAGNOSIS — R4182 Altered mental status, unspecified: Secondary | ICD-10-CM | POA: Diagnosis not present

## 2023-12-27 DIAGNOSIS — G40909 Epilepsy, unspecified, not intractable, without status epilepticus: Secondary | ICD-10-CM | POA: Diagnosis not present

## 2023-12-27 LAB — CBC WITH DIFFERENTIAL/PLATELET
Abs Immature Granulocytes: 0.1 K/uL — ABNORMAL HIGH (ref 0.00–0.07)
Basophils Absolute: 0 K/uL (ref 0.0–0.1)
Basophils Relative: 1 %
Eosinophils Absolute: 0 K/uL (ref 0.0–0.5)
Eosinophils Relative: 1 %
HCT: 23.3 % — ABNORMAL LOW (ref 36.0–46.0)
Hemoglobin: 8.2 g/dL — ABNORMAL LOW (ref 12.0–15.0)
Immature Granulocytes: 7 %
Lymphocytes Relative: 43 %
Lymphs Abs: 0.7 K/uL (ref 0.7–4.0)
MCH: 32.2 pg (ref 26.0–34.0)
MCHC: 35.2 g/dL (ref 30.0–36.0)
MCV: 91.4 fL (ref 80.0–100.0)
Monocytes Absolute: 0.1 K/uL (ref 0.1–1.0)
Monocytes Relative: 8 %
Neutro Abs: 0.6 K/uL — ABNORMAL LOW (ref 1.7–7.7)
Neutrophils Relative %: 40 %
Platelets: 36 K/uL — ABNORMAL LOW (ref 150–400)
RBC: 2.55 MIL/uL — ABNORMAL LOW (ref 3.87–5.11)
RDW: 15.9 % — ABNORMAL HIGH (ref 11.5–15.5)
Smear Review: NORMAL
WBC: 1.5 K/uL — ABNORMAL LOW (ref 4.0–10.5)
nRBC: 1.3 % — ABNORMAL HIGH (ref 0.0–0.2)

## 2023-12-27 LAB — ETHANOL: Alcohol, Ethyl (B): <15 mg/dL (ref <15)

## 2023-12-27 LAB — PROTIME-INR
INR: 1.2 (ref 0.8–1.2)
Prothrombin Time: 15.7 s — ABNORMAL HIGH (ref 11.4–15.2)

## 2023-12-27 MED ORDER — LEVETIRACETAM (KEPPRA) 500 MG/5 ML ADULT IV PUSH
1000.0000 mg | Freq: Once | INTRAVENOUS | Status: AC
  Administered 2023-12-27: 1000 mg via INTRAVENOUS
  Filled 2023-12-27: qty 10

## 2023-12-27 MED ORDER — SODIUM CHLORIDE 0.9 % IV BOLUS
1000.0000 mL | Freq: Once | INTRAVENOUS | Status: AC
  Administered 2023-12-27: 1000 mL via INTRAVENOUS

## 2023-12-27 MED ORDER — ONDANSETRON HCL 4 MG/2ML IJ SOLN
4.0000 mg | Freq: Once | INTRAMUSCULAR | Status: AC
  Administered 2023-12-27: 4 mg via INTRAVENOUS
  Filled 2023-12-27: qty 2

## 2023-12-27 NOTE — ED Provider Notes (Signed)
  Physical Exam  BP (!) 108/90   Pulse (!) 112   Temp 99.5 F (37.5 C) (Oral)   Resp 16   LMP 08/23/2011   SpO2 99%   Physical Exam  Procedures  Procedures  ED Course / MDM    Medical Decision Making Care assumed at 3 PM.  Patient had a seizure and was loaded with Keppra .  Unfortunately patient's labs clotted.  Signout pending chemistry  3:48 PM Unfortunately, lab was unable to get her labs.  I also tried to use an ultrasound to stick her but she refused to be stuck again.  She has been observed in the ED for more than 8 hours and had no seizure activity.  She did miss her doses of Keppra  so I think her seizure likely from medication noncompliance.  She is eating and drinking at bedside.  At this point I do not think she needs further workup in the ER.  Discussed with significant other who will come and pick her up  Problems Addressed: Pancytopenia [facility]): acute illness or injury Seizure [facility]): chronic illness or injury  Amount and/or Complexity of Data Reviewed Labs: ordered. Radiology: ordered.  Risk Prescription drug management.          Patt Alm Macho, MD 12/27/23 [phone]

## 2023-12-27 NOTE — ED Notes (Signed)
 Patient transported to CT

## 2023-12-27 NOTE — ED Provider Notes (Signed)
 [place] [facility] EMERGENCY DEPARTMENT AT [facility] Provider Note   CSN: [id number] Arrival date & time: 12/27/23  9196     Patient presents with: Seizures   Christina Byrd is a 58 y.o. female.    Seizures Patient presents after seizure.  Reportedly had seizure at 3 AM and again at 7:30 AM.  History of seizure disorder.  States that she has not been able to take her medicines because she has had nausea vomiting for the last couple days.  Had drank a little bit alcohol the other day.  Has a history of heavy alcohol use.  States however she has not been drinking much at all.  Does not feel as if she was withdrawing.  Denies head injury.  Does have some abdominal pain.     Prior to Admission medications   Medication Sig Start Date End Date Taking? Authorizing Provider  acetaminophen  (TYLENOL ) 325 MG tablet Take 2 tablets (650 mg total) by mouth every 6 (six) hours as needed for mild pain (pain score 1-3) or fever (or Fever >/= 101). 07/27/23   Kandis Perkins, DO  allopurinol  (ZYLOPRIM ) 100 MG tablet Take 1 tablet (100 mg total) by mouth daily. 12/09/23   Harrie Bruckner, DO  atorvastatin  (LIPITOR) 20 MG tablet Take 1 tablet (20 mg total) by mouth daily. 10/20/23   Shawn Sick, MD  cetirizine  (ZYRTEC  ALLERGY) 10 MG tablet Take 1 tablet (10 mg total) by mouth daily. 12/08/23 12/07/24  Harrie Bruckner, DO  diclofenac  Sodium (VOLTAREN ) 1 % GEL Apply 4 g topically 4 (four) times daily. 10/27/23   Harrie Bruckner, DO  DULoxetine  (CYMBALTA ) 30 MG capsule Take 1 capsule (30 mg total) by mouth daily. 10/20/23 10/19/24  Shawn Sick, MD  feeding supplement (ENSURE ENLIVE / ENSURE PLUS) LIQD Take 237 mLs by mouth 3 (three) times daily between meals. Patient not taking: Reported on 10/21/2023 12/30/22   Fernand Prost, MD  folic acid  (FOLVITE ) 1 MG tablet Take 1 tablet (1 mg total) by mouth daily. 12/08/23   Harrie Bruckner, DO  levETIRAcetam  (KEPPRA ) 750 MG tablet Take 1 tablet (750 mg  total) by mouth 2 (two) times daily. 12/08/23   Harrie Bruckner, DO  levothyroxine  (SYNTHROID ) 25 MCG tablet Take 1 tablet (25 mcg total) by mouth daily before breakfast. 10/20/23   Shawn Sick, MD  magnesium  oxide (MAG-OX) 400 (240 Mg) MG tablet Take 1 tablet (400 mg total) by mouth daily. Patient not taking: Reported on 10/21/2023 07/27/23   Kandis Perkins, DO  melatonin 3 MG TABS tablet Take 1 tablet (3 mg total) by mouth at bedtime. 10/20/23   Norrine Sharper, MD  Multiple Vitamin (MULTIVITAMIN WITH MINERALS) TABS tablet Take 1 tablet by mouth daily. Patient not taking: Reported on 10/21/2023 05/03/23   Harrie Bruckner, DO  naltrexone  (DEPADE) 50 MG tablet Take 1 tablet (50 mg total) by mouth daily. 10/27/23   Harrie Bruckner, DO  ondansetron  (ZOFRAN -ODT) 4 MG disintegrating tablet Take 1 tablet (4 mg total) by mouth every 8 (eight) hours as needed for nausea or vomiting. 12/08/23   Harrie Bruckner, DO  pantoprazole  (PROTONIX ) 40 MG tablet Take 1 tablet (40 mg total) by mouth daily. 12/08/23   Harrie Bruckner, DO  predniSONE  (DELTASONE ) 20 MG tablet Take 20 mg by mouth. 12/21/23   [provider]  thiamine  (VITAMIN B1) 100 MG tablet Take 1 tablet (100 mg total) by mouth daily. 12/08/23   Harrie Bruckner, DO  metFORMIN  (GLUCOPHAGE ) 500 MG tablet Take 0.5 tablets (250  mg total) by mouth daily with breakfast. 03/05/20 04/02/20  Danton Jon HERO, PA-C    Allergies: Levaquin  [levofloxacin  in d5w], Losartan  potassium, Ace inhibitors, Cefepime , Chlorhexidine, Codeine , and Vancomycin     Review of Systems  Neurological:  Positive for seizures.    Updated Vital Signs BP (!) 108/90   Pulse (!) 112   Temp 99.5 F (37.5 C) (Oral)   Resp 16   LMP 08/23/2011   SpO2 99%   Physical Exam Vitals and nursing note reviewed.  HENT:     Head: Normocephalic.  Cardiovascular:     Rate and Rhythm: Regular rhythm.  Pulmonary:     Breath sounds: No wheezing.  Abdominal:      Tenderness: There is no abdominal tenderness.  Neurological:     Mental Status: She is alert. Mental status is at baseline.     (all labs ordered are listed, but only abnormal results are displayed) Labs Reviewed  CBC WITH DIFFERENTIAL/PLATELET - Abnormal; Notable for the following components:      Result Value   WBC 1.5 (*)    RBC 2.55 (*)    Hemoglobin 8.2 (*)    HCT 23.3 (*)    RDW 15.9 (*)    Platelets 36 (*)    nRBC 1.3 (*)    Neutro Abs 0.6 (*)    Abs Immature Granulocytes 0.10 (*)    All other components within normal limits  PROTIME-INR - Abnormal; Notable for the following components:   Prothrombin Time 15.7 (*)    All other components within normal limits  ETHANOL  PATHOLOGIST SMEAR REVIEW  COMPREHENSIVE METABOLIC PANEL WITH GFR  LIPASE, BLOOD  MAGNESIUM   CBG MONITORING, ED    EKG: EKG Interpretation Date/Time:  Tuesday December 27 2023 08:10:04 EST Ventricular Rate:  117 PR Interval:  144 QRS Duration:  78 QT Interval:  309 QTC Calculation: 431 R Axis:   76  Text Interpretation: Sinus tachycardia Low voltage, precordial leads Borderline repolarization abnormality Confirmed by Patsey Lot [id number]) on 12/27/2023 8:16:16 AM  Radiology: CT HEAD WO CONTRAST ( ) Result Date: 12/27/2023 EXAM: CT HEAD WITHOUT CONTRAST 12/27/2023 11:36:00 AM TECHNIQUE: CT of the head was performed without the administration of intravenous contrast. Automated exposure control, iterative reconstruction, and/or weight based adjustment of the mA/kV was utilized to reduce the radiation dose to as low as reasonably achievable. COMPARISON: MRI 07/05/2023. Head CT 08/25/2023. CLINICAL HISTORY: 58 year old female with seizure disorder and clinical change. FINDINGS: BRAIN AND VENTRICLES: No acute hemorrhage. No evidence of acute infarct. No hydrocephalus. No extra-axial collection. No mass effect or midline shift. Left middle frontal gyrus and subchondral malacia appear stable from the  previous MRI. Stable brain volume. Stable small area of chronic encephalomalacia at the junction of the left parietal and occipital lobes. And stable small chronic infarct at the anterior right cerebellum. Stable gray white differentiation. No suspicious intracranial vascular hyperdensity. ORBITS: No acute abnormality. SINUSES: No acute abnormality. SOFT TISSUES AND SKULL: No acute soft tissue abnormality. No skull fracture. IMPRESSION: 1. No acute intracranial abnormality. 2. 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 Electronically signed by: Helayne Hurst MD 12/27/2023 11:54 AM EST RP Workstation: [id number]     Procedures   Medications Ordered in the ED  sodium chloride  0.9 % bolus 1,000 mL (0 mLs Intravenous Stopped 12/27/23 1130)  ondansetron  (ZOFRAN ) injection 4 mg (4 mg Intravenous Given 12/27/23 1010)  levETIRAcetam  (KEPPRA ) undiluted injection 1,000 mg (1,000 mg Intravenous Given 12/27/23  1011)                                    Medical Decision Making Amount and/or Complexity of Data Reviewed Labs: ordered. Radiology: ordered.  Risk Prescription drug management.   Patient with seizure.  History of seizure disorder but also history of alcohol withdrawal seizure.  States not much alcohol use and noncompliant with her Keppra .  Will load on Keppra .  Will get basic blood work give some fluids continue to monitor.  With her vomiting also considered gastritis and pancreatitis.  Reviewed previous discharge notes with admission for seizure.  Does have somewhat worsening pancytopenia.  Reportedly IV had come out and had been a difficult IV initially.  However do not have CMP or lipase back.  Has had her Keppra .  Will attempt to get more blood work.  Care turned over to Dr. Patt.       Final diagnoses:  Seizure (HCC)  Pancytopenia [facility])    ED Discharge Orders     None          Patsey Lot, MD 12/27/23 1506

## 2023-12-27 NOTE — Discharge Instructions (Signed)
Please take your keppra as prescribed.

## 2023-12-27 NOTE — ED Triage Notes (Signed)
 Patient BIB EMS from home c/o 2 seizures, 1 at 3AM and 1 and 730AM. Refused EMS the first seizure, but came the second. Patient has a history of seizures and takes Keppra  for them. Denies missing any doses, but family says otherwise. Patient has history of alcohol abuse, but states she has not had any alcohol. Patient states the last seizure she had before today was a year ago. Patient unstable gait at baseline.

## 2023-12-27 NOTE — ED Notes (Signed)
 Patient removed IV herself. Patient also states that she is, refusing care until I get ice water . MD notified.

## 2023-12-27 NOTE — ED Notes (Signed)
 Patient placed on bedpan.

## 2023-12-28 ENCOUNTER — Ambulatory Visit: Payer: Self-pay

## 2023-12-28 LAB — PATHOLOGIST SMEAR REVIEW

## 2023-12-28 NOTE — Telephone Encounter (Signed)
 RTC to patient-states that she went to the ER for seizures as she has been unable to keep her meds down.  Has had N/V and Diarrhea for a few day.  Was asked to leave.  Patient was advised to return to the hospital as she is still having some nausea, vomiting and diarrhea. Very tired. Refuses to go now as she has a child with her but will go tomorrow. Patient was advised to go to the ER as soon as possible today after some one comes to pick up the child she is watching.  Patient stated would go to the ER tomorrow.  Advised again to go today.  Stated no transportation and stated did not want to go by ambulance.

## 2023-12-28 NOTE — Telephone Encounter (Signed)
 FYI Only or Action Required?: Action required by provider: update on patient condition. ED Refusal- CAL notified. Pt states she will go tomorrow  Patient was last seen in primary care on 12/08/2023 by Harrie Bruckner, DO.  Called Nurse Triage reporting Emesis.  Symptoms began several days ago.  Interventions attempted: Rest, hydration, or home remedies.  Symptoms are: gradually worsening.  Triage Disposition: Go to ED Now (or PCP Triage)  Patient/caregiver understands and will follow disposition?: No, refuses disposition  Copied from CRM [id number]. Topic: Clinical - Red Word Triage >> Dec 28, 2023 11:40 AM Miquel SAILOR wrote: Pt went to ER on 11/04 due to having seizuers/Legs tiggling/Body hurts 3-4 days straight Gerri /Vomitting Still having all issues   Reason for Disposition  [1] SEVERE vomiting (e.g., 6 or more times/day) AND [2] present > 8 hours (Exception: Patient sounds well, is drinking liquids, does not sound dehydrated, and vomiting has lasted less than 24 hours.)  Answer Assessment - Initial Assessment Questions Patient just in the ED last night for seizures, last sz was over a year ago otehrwise. States they never addressed her other issues and discharged her home. Pt unable to keep down any food, liquid, or pills. Hasn't taken her sz meds. Endorses dizziness/lightheaded. Reports urinating a normal amount. Voice is hoarse with dry cough. Patient advised to go to ED for fluid resuscitation and IV sz meds if needed. Advised the office does not have the tools or diagnostic capability to determine if something else is going on with her GI system. Reporting black diarrhea stool. Previous colonoscopy with no findings per patient.   Pt watching her grandkid today and states she will go into the ED tomorrow. States that people are constantly in and out of her house so someone would find her if she fell out or had another sz. CAL contacted with refusal and they will reach out to pt  and advise ED again as well.   1. VOMITING SEVERITY: How many times have you vomited in the past 24 hours?      Every time she tries to eat of drink something  2. ONSET: When did the vomiting begin?      2 weeks  3. FLUIDS: What fluids or food have you vomited up today? Have you been able to keep any fluids down?     Everything she has put in  4. ABDOMEN PAIN: Are your having any abdomen pain? If Yes : How bad is it and what does it feel like? (e.g., crampy, dull, intermittent, constant)      denies 5. DIARRHEA: Is there any diarrhea? If Yes, ask: How many times today?      Yes- black - for about 2 months 6. CONTACTS: Is there anyone else in the family with the same symptoms?      Denies  7. CAUSE: What do you think is causing your vomiting?     Unsure  8. HYDRATION STATUS: Any signs of dehydration? (e.g., dry mouth [not only dry lips], too weak to stand) When did you last urinate?     Dizziness, unable to keep down fluids. Endorses urinating normally  9. OTHER SYMPTOMS: Do you have any other symptoms? (e.g., fever, headache, vertigo, vomiting blood or coffee grounds, recent head injury)     dizziness  Protocols used: Vomiting-A-AH

## 2023-12-31 ENCOUNTER — Encounter (HOSPITAL_COMMUNITY): Payer: Self-pay

## 2023-12-31 ENCOUNTER — Emergency Department (HOSPITAL_COMMUNITY)

## 2023-12-31 ENCOUNTER — Other Ambulatory Visit: Payer: Self-pay

## 2023-12-31 ENCOUNTER — Inpatient Hospital Stay (HOSPITAL_COMMUNITY)
Admission: EM | Admit: 2023-12-31 | Discharge: 2024-01-05 | DRG: 871 | Disposition: A | Discharge status: Home Health | Attending: Infectious Diseases | Admitting: Infectious Diseases

## 2023-12-31 DIAGNOSIS — Z91148 Patient's other noncompliance with medication regimen for other reason: Secondary | ICD-10-CM | POA: Diagnosis not present

## 2023-12-31 DIAGNOSIS — R6521 Severe sepsis with septic shock: Secondary | ICD-10-CM | POA: Diagnosis present

## 2023-12-31 DIAGNOSIS — E878 Other disorders of electrolyte and fluid balance, not elsewhere classified: Secondary | ICD-10-CM | POA: Diagnosis not present

## 2023-12-31 DIAGNOSIS — K760 Fatty (change of) liver, not elsewhere classified: Secondary | ICD-10-CM | POA: Diagnosis present

## 2023-12-31 DIAGNOSIS — F1090 Alcohol use, unspecified, uncomplicated: Secondary | ICD-10-CM | POA: Diagnosis not present

## 2023-12-31 DIAGNOSIS — E876 Hypokalemia: Secondary | ICD-10-CM | POA: Diagnosis not present

## 2023-12-31 DIAGNOSIS — Y905 Blood alcohol level of 100-119 mg/100 ml: Secondary | ICD-10-CM | POA: Diagnosis present

## 2023-12-31 DIAGNOSIS — Z79890 Hormone replacement therapy (postmenopausal): Secondary | ICD-10-CM

## 2023-12-31 DIAGNOSIS — G40909 Epilepsy, unspecified, not intractable, without status epilepticus: Secondary | ICD-10-CM | POA: Diagnosis not present

## 2023-12-31 DIAGNOSIS — E11649 Type 2 diabetes mellitus with hypoglycemia without coma: Secondary | ICD-10-CM | POA: Diagnosis not present

## 2023-12-31 DIAGNOSIS — E871 Hypo-osmolality and hyponatremia: Secondary | ICD-10-CM | POA: Diagnosis present

## 2023-12-31 DIAGNOSIS — F10229 Alcohol dependence with intoxication, unspecified: Secondary | ICD-10-CM | POA: Diagnosis present

## 2023-12-31 DIAGNOSIS — G928 Other toxic encephalopathy: Secondary | ICD-10-CM | POA: Diagnosis present

## 2023-12-31 DIAGNOSIS — I10 Essential (primary) hypertension: Secondary | ICD-10-CM | POA: Diagnosis not present

## 2023-12-31 DIAGNOSIS — Z7984 Long term (current) use of oral hypoglycemic drugs: Secondary | ICD-10-CM

## 2023-12-31 DIAGNOSIS — A419 Sepsis, unspecified organism: Principal | ICD-10-CM

## 2023-12-31 DIAGNOSIS — Z681 Body mass index (BMI) 19 or less, adult: Secondary | ICD-10-CM | POA: Diagnosis not present

## 2023-12-31 DIAGNOSIS — E872 Acidosis, unspecified: Secondary | ICD-10-CM | POA: Diagnosis not present

## 2023-12-31 DIAGNOSIS — E162 Hypoglycemia, unspecified: Secondary | ICD-10-CM | POA: Diagnosis not present

## 2023-12-31 DIAGNOSIS — E039 Hypothyroidism, unspecified: Secondary | ICD-10-CM | POA: Diagnosis not present

## 2023-12-31 DIAGNOSIS — N179 Acute kidney failure, unspecified: Secondary | ICD-10-CM | POA: Diagnosis not present

## 2023-12-31 DIAGNOSIS — R9431 Abnormal electrocardiogram [ECG] [EKG]: Secondary | ICD-10-CM | POA: Diagnosis not present

## 2023-12-31 DIAGNOSIS — E43 Unspecified severe protein-calorie malnutrition: Secondary | ICD-10-CM

## 2023-12-31 DIAGNOSIS — B964 Proteus (mirabilis) (morganii) as the cause of diseases classified elsewhere: Secondary | ICD-10-CM | POA: Diagnosis not present

## 2023-12-31 DIAGNOSIS — F1010 Alcohol abuse, uncomplicated: Secondary | ICD-10-CM | POA: Diagnosis not present

## 2023-12-31 DIAGNOSIS — R578 Other shock: Secondary | ICD-10-CM | POA: Diagnosis present

## 2023-12-31 DIAGNOSIS — R54 Age-related physical debility: Secondary | ICD-10-CM | POA: Diagnosis present

## 2023-12-31 DIAGNOSIS — R531 Weakness: Secondary | ICD-10-CM

## 2023-12-31 DIAGNOSIS — Z79899 Other long term (current) drug therapy: Secondary | ICD-10-CM

## 2023-12-31 DIAGNOSIS — Z885 Allergy status to narcotic agent status: Secondary | ICD-10-CM

## 2023-12-31 DIAGNOSIS — I4891 Unspecified atrial fibrillation: Secondary | ICD-10-CM | POA: Diagnosis present

## 2023-12-31 DIAGNOSIS — D649 Anemia, unspecified: Secondary | ICD-10-CM | POA: Diagnosis not present

## 2023-12-31 DIAGNOSIS — E8721 Acute metabolic acidosis: Secondary | ICD-10-CM | POA: Diagnosis not present

## 2023-12-31 DIAGNOSIS — K922 Gastrointestinal hemorrhage, unspecified: Secondary | ICD-10-CM | POA: Diagnosis not present

## 2023-12-31 DIAGNOSIS — D62 Acute posthemorrhagic anemia: Secondary | ICD-10-CM | POA: Diagnosis not present

## 2023-12-31 DIAGNOSIS — E512 Wernicke's encephalopathy: Secondary | ICD-10-CM | POA: Diagnosis not present

## 2023-12-31 DIAGNOSIS — F10231 Alcohol dependence with withdrawal delirium: Secondary | ICD-10-CM | POA: Diagnosis not present

## 2023-12-31 DIAGNOSIS — R680 Hypothermia, not associated with low environmental temperature: Secondary | ICD-10-CM | POA: Diagnosis present

## 2023-12-31 DIAGNOSIS — Z87891 Personal history of nicotine dependence: Secondary | ICD-10-CM

## 2023-12-31 DIAGNOSIS — R4182 Altered mental status, unspecified: Secondary | ICD-10-CM | POA: Diagnosis not present

## 2023-12-31 DIAGNOSIS — Z888 Allergy status to other drugs, medicaments and biological substances status: Secondary | ICD-10-CM

## 2023-12-31 DIAGNOSIS — R404 Transient alteration of awareness: Secondary | ICD-10-CM | POA: Diagnosis not present

## 2023-12-31 DIAGNOSIS — R059 Cough, unspecified: Secondary | ICD-10-CM | POA: Diagnosis not present

## 2023-12-31 DIAGNOSIS — N390 Urinary tract infection, site not specified: Secondary | ICD-10-CM | POA: Diagnosis not present

## 2023-12-31 DIAGNOSIS — E785 Hyperlipidemia, unspecified: Secondary | ICD-10-CM | POA: Diagnosis present

## 2023-12-31 DIAGNOSIS — D696 Thrombocytopenia, unspecified: Secondary | ICD-10-CM | POA: Diagnosis not present

## 2023-12-31 DIAGNOSIS — R197 Diarrhea, unspecified: Secondary | ICD-10-CM | POA: Diagnosis present

## 2023-12-31 DIAGNOSIS — F10931 Alcohol use, unspecified with withdrawal delirium: Secondary | ICD-10-CM

## 2023-12-31 DIAGNOSIS — G9341 Metabolic encephalopathy: Secondary | ICD-10-CM | POA: Diagnosis not present

## 2023-12-31 DIAGNOSIS — F10930 Alcohol use, unspecified with withdrawal, uncomplicated: Secondary | ICD-10-CM | POA: Diagnosis not present

## 2023-12-31 DIAGNOSIS — T68XXXA Hypothermia, initial encounter: Secondary | ICD-10-CM | POA: Diagnosis not present

## 2023-12-31 DIAGNOSIS — F10939 Alcohol use, unspecified with withdrawal, unspecified: Secondary | ICD-10-CM | POA: Diagnosis present

## 2023-12-31 DIAGNOSIS — G621 Alcoholic polyneuropathy: Secondary | ICD-10-CM | POA: Diagnosis present

## 2023-12-31 DIAGNOSIS — R579 Shock, unspecified: Secondary | ICD-10-CM

## 2023-12-31 DIAGNOSIS — R5381 Other malaise: Secondary | ICD-10-CM | POA: Diagnosis present

## 2023-12-31 DIAGNOSIS — K219 Gastro-esophageal reflux disease without esophagitis: Secondary | ICD-10-CM | POA: Diagnosis not present

## 2023-12-31 DIAGNOSIS — Z881 Allergy status to other antibiotic agents status: Secondary | ICD-10-CM

## 2023-12-31 DIAGNOSIS — Z8249 Family history of ischemic heart disease and other diseases of the circulatory system: Secondary | ICD-10-CM

## 2023-12-31 DIAGNOSIS — R61 Generalized hyperhidrosis: Secondary | ICD-10-CM | POA: Diagnosis not present

## 2023-12-31 DIAGNOSIS — T4276XA Underdosing of unspecified antiepileptic and sedative-hypnotic drugs, initial encounter: Secondary | ICD-10-CM | POA: Diagnosis not present

## 2023-12-31 DIAGNOSIS — Z883 Allergy status to other anti-infective agents status: Secondary | ICD-10-CM

## 2023-12-31 DIAGNOSIS — R569 Unspecified convulsions: Secondary | ICD-10-CM | POA: Diagnosis not present

## 2023-12-31 LAB — CBG MONITORING, ED
Glucose-Capillary: 84 mg/dL (ref 70–99)
Glucose-Capillary: 84 mg/dL (ref 70–99)
Glucose-Capillary: 98 mg/dL (ref 70–99)
Glucose-Capillary: 68 mg/dL — ABNORMAL LOW (ref 70–99)
Glucose-Capillary: 72 mg/dL (ref 70–99)

## 2023-12-31 LAB — CK: Total CK: 453 U/L — ABNORMAL HIGH (ref 38–234)

## 2023-12-31 LAB — CBC WITH DIFFERENTIAL/PLATELET
Band Neutrophils: 1 %
Basophils Absolute: 0 K/uL (ref 0.0–0.1)
Basophils Relative: 0 %
Eosinophils Absolute: 0 K/uL (ref 0.0–0.5)
Eosinophils Relative: 0 %
HCT: 25.6 % — ABNORMAL LOW (ref 36.0–46.0)
Hemoglobin: 8.7 g/dL — ABNORMAL LOW (ref 12.0–15.0)
Lymphocytes Relative: 15 %
Lymphs Abs: 1.2 K/uL (ref 0.7–4.0)
MCH: 31.9 pg (ref 26.0–34.0)
MCHC: 34 g/dL (ref 30.0–36.0)
MCV: 93.8 fL (ref 80.0–100.0)
Monocytes Absolute: 0.5 K/uL (ref 0.1–1.0)
Monocytes Relative: 6 %
Neutro Abs: 6.3 K/uL (ref 1.7–7.7)
Neutrophils Relative %: 78 %
Platelets: 65 K/uL — ABNORMAL LOW (ref 150–400)
RBC: 2.73 MIL/uL — ABNORMAL LOW (ref 3.87–5.11)
RDW: 15.7 % — ABNORMAL HIGH (ref 11.5–15.5)
Smear Review: NORMAL
WBC: 8 K/uL (ref 4.0–10.5)
nRBC: 0.5 % — ABNORMAL HIGH (ref 0.0–0.2)

## 2023-12-31 LAB — I-STAT VENOUS BLOOD GAS, ED
Acid-base deficit: 12 mmol/L — ABNORMAL HIGH (ref 0.0–2.0)
Bicarbonate: 14.5 mmol/L — ABNORMAL LOW (ref 20.0–28.0)
Calcium, Ion: 0.55 mmol/L — CL (ref 1.15–1.40)
HCT: 29 % — ABNORMAL LOW (ref 36.0–46.0)
Hemoglobin: 9.9 g/dL — ABNORMAL LOW (ref 12.0–15.0)
O2 Saturation: 74 %
Potassium: <2 mmol/L — CL (ref 3.5–5.1)
Sodium: 135 mmol/L (ref 135–145)
TCO2: 15 mmol/L — ABNORMAL LOW (ref 22–32)
pCO2, Ven: 33 mmHg — ABNORMAL LOW (ref 44–60)
pH, Ven: 7.251 (ref 7.25–7.43)
pO2, Ven: 45 mmHg (ref 32–45)

## 2023-12-31 LAB — COMPREHENSIVE METABOLIC PANEL WITH GFR
ALT: 14 U/L (ref 0–44)
AST: 94 U/L — ABNORMAL HIGH (ref 15–41)
Albumin: 2.3 g/dL — ABNORMAL LOW (ref 3.5–5.0)
Alkaline Phosphatase: 95 U/L (ref 38–126)
Anion gap: 23 — ABNORMAL HIGH (ref 5–15)
BUN: 32 mg/dL — ABNORMAL HIGH (ref 6–20)
CO2: 14 mmol/L — ABNORMAL LOW (ref 22–32)
Calcium: 4.3 mg/dL — CL (ref 8.9–10.3)
Chloride: 97 mmol/L — ABNORMAL LOW (ref 98–111)
Creatinine, Ser: 2.62 mg/dL — ABNORMAL HIGH (ref 0.44–1.00)
GFR, Estimated: 21 mL/min — ABNORMAL LOW (ref >60)
Glucose, Bld: 79 mg/dL (ref 70–99)
Potassium: 2.1 mmol/L — CL (ref 3.5–5.1)
Sodium: 134 mmol/L — ABNORMAL LOW (ref 135–145)
Total Bilirubin: 0.6 mg/dL (ref 0.0–1.2)
Total Protein: 6.1 g/dL — ABNORMAL LOW (ref 6.5–8.1)

## 2023-12-31 LAB — URINALYSIS, W/ REFLEX TO CULTURE (INFECTION SUSPECTED)
Bilirubin Urine: NEGATIVE
Glucose, UA: NEGATIVE mg/dL
Ketones, ur: NEGATIVE mg/dL
Nitrite: NEGATIVE
Protein, ur: 30 mg/dL — AB
Specific Gravity, Urine: 1.008 (ref 1.005–1.030)
pH: 6 (ref 5.0–8.0)

## 2023-12-31 LAB — ETHANOL: Alcohol, Ethyl (B): 106 mg/dL — ABNORMAL HIGH (ref <15)

## 2023-12-31 LAB — T4, FREE: Free T4: 1.22 ng/dL — ABNORMAL HIGH (ref 0.61–1.12)

## 2023-12-31 LAB — PHOSPHORUS: Phosphorus: 4 mg/dL (ref 2.5–4.6)

## 2023-12-31 LAB — I-STAT CG4 LACTIC ACID, ED
Lactic Acid, Venous: 5.5 mmol/L (ref 0.5–1.9)
Lactic Acid, Venous: 9.7 mmol/L (ref 0.5–1.9)

## 2023-12-31 LAB — CORTISOL: Cortisol, Plasma: 36.3 ug/dL

## 2023-12-31 LAB — MAGNESIUM: Magnesium: <0.5 mg/dL — CL (ref 1.7–2.4)

## 2023-12-31 MED ORDER — PIPERACILLIN-TAZOBACTAM 3.375 G IVPB 30 MIN
3.3750 g | Freq: Once | INTRAVENOUS | Status: AC
  Administered 2023-12-31: 3.375 g via INTRAVENOUS
  Filled 2023-12-31: qty 50

## 2023-12-31 MED ORDER — VANCOMYCIN HCL IN DEXTROSE 1-5 GM/200ML-% IV SOLN
1000.0000 mg | Freq: Once | INTRAVENOUS | Status: AC
  Administered 2023-12-31: 1000 mg via INTRAVENOUS
  Filled 2023-12-31: qty 200

## 2023-12-31 MED ORDER — POTASSIUM CHLORIDE 10 MEQ/100ML IV SOLN
10.0000 meq | Freq: Once | INTRAVENOUS | Status: AC
  Administered 2023-12-31: 10 meq via INTRAVENOUS
  Filled 2023-12-31: qty 100

## 2023-12-31 MED ORDER — PHENOBARBITAL SODIUM 130 MG/ML IJ SOLN
97.5000 mg | Freq: Three times a day (TID) | INTRAMUSCULAR | Status: AC
Start: 2023-12-31 — End: 2024-01-02
  Administered 2023-12-31 – 2024-01-02 (×5): 97.5 mg via INTRAVENOUS
  Filled 2023-12-31 (×5): qty 1

## 2023-12-31 MED ORDER — POTASSIUM CHLORIDE 10 MEQ/100ML IV SOLN
10.0000 meq | INTRAVENOUS | Status: AC
  Administered 2023-12-31 (×2): 10 meq via INTRAVENOUS
  Filled 2023-12-31 (×2): qty 100

## 2023-12-31 MED ORDER — POTASSIUM CHLORIDE 20 MEQ PO PACK
40.0000 meq | PACK | Freq: Two times a day (BID) | ORAL | Status: DC
  Administered 2023-12-31: 40 meq via ORAL
  Filled 2023-12-31 (×3): qty 2

## 2023-12-31 MED ORDER — PHENOBARBITAL SODIUM 65 MG/ML IJ SOLN
32.5000 mg | Freq: Three times a day (TID) | INTRAMUSCULAR | Status: DC
  Administered 2024-01-04 – 2024-01-05 (×2): 32.5 mg via INTRAVENOUS
  Filled 2023-12-31 (×4): qty 1

## 2023-12-31 MED ORDER — LEVETIRACETAM (KEPPRA) 500 MG/5 ML ADULT IV PUSH
2500.0000 mg | Freq: Once | INTRAVENOUS | Status: AC
  Administered 2023-12-31: 2500 mg via INTRAVENOUS
  Filled 2023-12-31: qty 25

## 2023-12-31 MED ORDER — LORAZEPAM 1 MG PO TABS: 1.0000 mg | ORAL_TABLET | ORAL | Status: DC | PRN

## 2023-12-31 MED ORDER — MAGNESIUM SULFATE 4 GM/100ML IV SOLN
4.0000 g | Freq: Once | INTRAVENOUS | Status: AC
  Administered 2023-12-31: 4 g via INTRAVENOUS
  Filled 2023-12-31: qty 100

## 2023-12-31 MED ORDER — INSULIN ASPART 100 UNIT/ML IJ SOLN
0.0000 [IU] | INTRAMUSCULAR | Status: DC
  Administered 2024-01-01: 3 [IU] via SUBCUTANEOUS
  Administered 2024-01-02: 2 [IU] via SUBCUTANEOUS
  Administered 2024-01-02 – 2024-01-03 (×4): 1 [IU] via SUBCUTANEOUS
  Administered 2024-01-03: 2 [IU] via SUBCUTANEOUS
  Filled 2023-12-31: qty 1
  Filled 2023-12-31: qty 3
  Filled 2023-12-31 (×3): qty 2
  Filled 2023-12-31: qty 1

## 2023-12-31 MED ORDER — LEVOTHYROXINE SODIUM 25 MCG PO TABS
25.0000 ug | ORAL_TABLET | Freq: Every day | ORAL | Status: DC
  Administered 2024-01-01 – 2024-01-05 (×5): 25 ug via ORAL
  Filled 2023-12-31 (×5): qty 1

## 2023-12-31 MED ORDER — LORAZEPAM 2 MG/ML IJ SOLN
INTRAMUSCULAR | Status: AC
  Filled 2023-12-31: qty 1

## 2023-12-31 MED ORDER — ATORVASTATIN CALCIUM 10 MG PO TABS
20.0000 mg | ORAL_TABLET | Freq: Every day | ORAL | Status: DC
  Administered 2024-01-01 – 2024-01-04 (×3): 20 mg via ORAL
  Filled 2023-12-31 (×4): qty 2

## 2023-12-31 MED ORDER — CALCIUM GLUCONATE-NACL 2-0.675 GM/100ML-% IV SOLN
2.0000 g | Freq: Once | INTRAVENOUS | Status: AC
  Administered 2023-12-31: 2000 mg via INTRAVENOUS
  Filled 2023-12-31: qty 100

## 2023-12-31 MED ORDER — LORAZEPAM 2 MG/ML IJ SOLN: 1.0000 mg | INTRAMUSCULAR | Status: DC | PRN

## 2023-12-31 MED ORDER — LORAZEPAM 2 MG/ML IJ SOLN
1.0000 mg | INTRAMUSCULAR | Status: DC | PRN
  Administered 2024-01-01: 1 mg via INTRAVENOUS
  Filled 2023-12-31: qty 1

## 2023-12-31 MED ORDER — LACTATED RINGERS IV BOLUS
1000.0000 mL | Freq: Once | INTRAVENOUS | Status: AC
  Administered 2023-12-31: 1000 mL via INTRAVENOUS

## 2023-12-31 MED ORDER — PHENOBARBITAL SODIUM 130 MG/ML IJ SOLN
65.0000 mg | Freq: Three times a day (TID) | INTRAMUSCULAR | Status: AC
  Administered 2024-01-02 – 2024-01-04 (×6): 65 mg via INTRAVENOUS
  Filled 2023-12-31 (×3): qty 1

## 2023-12-31 MED ORDER — THIAMINE HCL 100 MG/ML IJ SOLN
200.0000 mg | INTRAVENOUS | Status: DC
  Filled 2023-12-31 (×2): qty 2

## 2023-12-31 MED ORDER — PIPERACILLIN-TAZOBACTAM 3.375 G IVPB: 3.3750 g | Freq: Three times a day (TID) | INTRAVENOUS | Status: DC

## 2023-12-31 MED ORDER — FOLIC ACID 1 MG PO TABS
1.0000 mg | ORAL_TABLET | Freq: Every day | ORAL | Status: DC
  Administered 2023-12-31 – 2024-01-04 (×4): 1 mg via ORAL
  Filled 2023-12-31 (×5): qty 1

## 2023-12-31 MED ORDER — DOCUSATE SODIUM 100 MG PO CAPS: 100.0000 mg | ORAL_CAPSULE | Freq: Two times a day (BID) | ORAL | Status: DC | PRN

## 2023-12-31 MED ORDER — PANTOPRAZOLE SODIUM 40 MG IV SOLR
40.0000 mg | INTRAVENOUS | Status: DC
  Administered 2023-12-31: 40 mg via INTRAVENOUS
  Filled 2023-12-31: qty 10

## 2023-12-31 MED ORDER — POLYETHYLENE GLYCOL 3350 17 G PO PACK: 17.0000 g | PACK | Freq: Every day | ORAL | Status: DC | PRN

## 2023-12-31 MED ORDER — THIAMINE MONONITRATE 100 MG PO TABS
100.0000 mg | ORAL_TABLET | ORAL | Status: DC
  Administered 2023-12-31: 100 mg via ORAL
  Filled 2023-12-31: qty 1

## 2023-12-31 MED ORDER — DULOXETINE HCL 30 MG PO CPEP
30.0000 mg | ORAL_CAPSULE | Freq: Every day | ORAL | Status: DC
  Administered 2024-01-01 – 2024-01-04 (×3): 30 mg via ORAL
  Filled 2023-12-31 (×4): qty 1

## 2023-12-31 MED ORDER — PIPERACILLIN-TAZOBACTAM IN DEX 2-0.25 GM/50ML IV SOLN
2.2500 g | Freq: Three times a day (TID) | INTRAVENOUS | Status: DC
  Administered 2024-01-01 (×2): 2.25 g via INTRAVENOUS
  Filled 2023-12-31 (×5): qty 50

## 2023-12-31 MED ORDER — LEVETIRACETAM 750 MG PO TABS
750.0000 mg | ORAL_TABLET | Freq: Two times a day (BID) | ORAL | Status: DC
  Administered 2024-01-01: 750 mg via ORAL
  Filled 2023-12-31 (×3): qty 1

## 2023-12-31 MED ORDER — LEVETIRACETAM (KEPPRA) 500 MG/5 ML ADULT IV PUSH
750.0000 mg | Freq: Two times a day (BID) | INTRAVENOUS | Status: DC
  Administered 2024-01-01 – 2024-01-02 (×2): 750 mg via INTRAVENOUS
  Filled 2023-12-31 (×3): qty 10

## 2023-12-31 MED ORDER — ADULT MULTIVITAMIN W/MINERALS CH
1.0000 | ORAL_TABLET | Freq: Every day | ORAL | Status: DC
  Administered 2023-12-31 – 2024-01-04 (×4): 1 via ORAL
  Filled 2023-12-31 (×5): qty 1

## 2023-12-31 NOTE — ED Notes (Signed)
 Attempted oral temp, pt did not tolerate well

## 2023-12-31 NOTE — Consult Note (Signed)
 NEUROLOGY CONSULT NOTE   Date of service: December 31, 2023 Patient Name: Christina Byrd MRN:  [id number] DOB:  03/17/1965 Chief Complaint: seizures Requesting Provider: Malka Domino, MD  History of Present Illness  Christina Byrd is a 58 y.o. female with hx of seizures as well as alcohol withdrawal and noncompliance with antiseizure meds who presents to the ED with presumed seizure.  Patient is disoriented to month and thought her husband brought her in for seizures. Per husband, called EMS as patient was lethargic and confused. He thought she had a seizure. EMS found glucose of 29, hypotensive to 70/40. She became more alert but still disoriented.  Per husband, drinks EtOH everyday. Will take her seizure medications some times and misses several doses. Patient denies any side effects and reports that she just forgets to take it. Has been having diarrhea and dry heaving.  In the ED, noted to have an episode of left lateral gaze, desat and not responsive to painful stimulus. Felt to be a seizure. She was loaded with Keppra  2500mg  IV once.  She is being admitted to the ICU. When ICU team evaluated her, she had an episode of starring off and lipsmacking. Neurology consulted for concern for seizures.   ROS  Unable to ascertain due to confusion. Endorses BL shoulder pain that is chronic and secondary to gout per patient.  Past History   Past Medical History:  Diagnosis Date   GASTROESOPHAGEAL REFLUX, NO ESOPHAGITIS 04/21/2006   Qualifier: Diagnosis of  By: WATT, JOANNE     GERD (gastroesophageal reflux disease) Dx 1995   Hot flash, menopausal 06/22/2016   Hot flashes 01/30/2020   HTN (hypertension)    Hypotension 10/21/2022   Patient's blood pressure at presentation was 87/65, follow up blood pressure was 81/58. Patient reports regurgitating everything she has taken po for the past month. Denies lightheadedness or dizziness. Discussed GI referral, though patient stated  she would be going to the emergency room today for IV fluids.   -GI referral for upper endoscopy placed  -Patient planning on going to the ED for IV flu   Intertrigo 06/22/2016   Nausea & vomiting 12/23/2022   Nausea and vomiting 12/24/2022   Pain and swelling of left ankle 12/02/2014   Seizures (HCC)    Tendinitis of right hip flexor 07/23/2014   Tendonitis, Achilles, right 04/01/2014    Past Surgical History:  Procedure Laterality Date   BIOPSY  06/03/2022   Procedure: BIOPSY;  Surgeon: Stacia Glendia BRAVO, MD;  Location: [facility] ENDOSCOPY;  Service: Gastroenterology;;   COLONOSCOPY N/A 06/21/2023   Procedure: COLONOSCOPY;  Surgeon: Albertus Gordy HERO, MD;  Location: [facility] ENDOSCOPY;  Service: Gastroenterology;  Laterality: N/A;   ESOPHAGOGASTRODUODENOSCOPY N/A 12/29/2022   Procedure: ESOPHAGOGASTRODUODENOSCOPY (EGD);  Surgeon: Nandigam, Kavitha V, MD;  Location: [facility] ENDOSCOPY;  Service: Gastroenterology;  Laterality: N/A;   ESOPHAGOGASTRODUODENOSCOPY N/A 06/21/2023   Procedure: EGD (ESOPHAGOGASTRODUODENOSCOPY);  Surgeon: Albertus Gordy HERO, MD;  Location: [facility] ENDOSCOPY;  Service: Gastroenterology;  Laterality: N/A;   ESOPHAGOGASTRODUODENOSCOPY (EGD) WITH PROPOFOL  N/A 06/03/2022   Procedure: ESOPHAGOGASTRODUODENOSCOPY (EGD) WITH PROPOFOL ;  Surgeon: Stacia Glendia BRAVO, MD;  Location: [facility] ENDOSCOPY;  Service: Gastroenterology;  Laterality: N/A;   HOT HEMOSTASIS N/A 06/03/2022   Procedure: HOT HEMOSTASIS (ARGON PLASMA COAGULATION/BICAP);  Surgeon: Stacia Glendia BRAVO, MD;  Location: [facility] ENDOSCOPY;  Service: Gastroenterology;  Laterality: N/A;    Family History: Family History  Problem Relation Age of Onset   CAD Mother     Social History  reports that  she quit smoking about 11 years ago. Her smoking use included cigarettes. She has never used smokeless tobacco. She reports that she does not currently use alcohol after a past usage of about 49.0 standard drinks of alcohol per week. She reports that she does not  use drugs.  Allergies  Allergen Reactions   Levaquin  [Levofloxacin  In D5w] Rash   Losartan  Potassium Rash   Ace Inhibitors Cough   Cefepime  Rash    09/04/12 pm Patient started to break out with small macules after IV Vanco infusion, then macules increased in size after starting cefepime  infusion.   Chlorhexidine Itching and Rash   Codeine  Nausea Only   Vancomycin  Rash    09/04/12 pm Patient started to break out with small macules after IV Vanco infusion, then macules increased in size after starting cefepime  infusion. Tolerated in May 2025    Medications   Current Facility-Administered Medications:    [START ON 01/01/2024] atorvastatin  (LIPITOR) tablet 20 mg, 20 mg, Oral, Daily, Christina Byrd, Christina E, PA-C   docusate sodium  (COLACE) capsule 100 mg, 100 mg, Oral, BID PRN, Christina Byrd, Christina E, PA-C   [START ON 01/01/2024] DULoxetine  (CYMBALTA ) DR capsule 30 mg, 30 mg, Oral, Daily, Christina Byrd, Christina E, PA-C   folic acid  (FOLVITE ) tablet 1 mg, 1 mg, Oral, Daily, Christina Byrd, Christina E, PA-C, 1 mg at 12/31/23 2034   insulin  aspart (novoLOG ) injection 0-9 Units, 0-9 Units, Subcutaneous, Q4H, Christina Byrd, Christina E, PA-C   [START ON 01/01/2024] levothyroxine  (SYNTHROID ) tablet 25 mcg, 25 mcg, Oral, Q0600, Christina Byrd, Christina E, PA-C   LORazepam  (ATIVAN ) injection 1 mg, 1 mg, Intravenous, Q4H PRN, Christina Byrd, Christina E, PA-C   multivitamin with minerals tablet 1 tablet, 1 tablet, Oral, Daily, Christina Byrd, Christina E, PA-C, 1 tablet at 12/31/23 2034   pantoprazole  (PROTONIX ) injection 40 mg, 40 mg, Intravenous, Q24H, Christina Byrd, Christina E, PA-C, 40 mg at 12/31/23 2205   PHENObarbital  (LUMINAL) injection 97.5 mg, 97.5 mg, Intravenous, Q8H, 97.5 mg at 12/31/23 2131 **FOLLOWED BY** [START ON 01/02/2024] PHENObarbital  (LUMINAL) injection 65 mg, 65 mg, Intravenous, Q8H **FOLLOWED BY** [START ON 01/04/2024] PHENObarbital  (LUMINAL) injection 32.5 mg, 32.5 mg, Intravenous, Q8H, Christina Byrd, Christina E, PA-C   [START ON 01/01/2024] piperacillin -tazobactam (ZOSYN ) IVPB  2.25 g, 2.25 g, Intravenous, Q8H, Assaker, Jean-Pierre, MD   polyethylene glycol (MIRALAX  / GLYCOLAX ) packet 17 g, 17 g, Oral, Daily PRN, Christina Byrd, Christina E, PA-C   potassium chloride  (KLOR-CON ) packet 40 mEq, 40 mEq, Oral, BID, Christina Byrd, Christina E, PA-C, 40 mEq at 12/31/23 2206   thiamine  (VITAMIN B1) 200 mg in sodium chloride  0.9 % 50 mL IVPB, 200 mg, Intravenous, Q24H **OR** thiamine  (VITAMIN B1) tablet 100 mg, 100 mg, Oral, Q24H, Christina Byrd, Christina E, PA-C, 100 mg at 12/31/23 2034  Current Outpatient Medications:    acetaminophen  (TYLENOL ) 325 MG tablet, Take 2 tablets (650 mg total) by mouth every 6 (six) hours as needed for mild pain (pain score 1-3) or fever (or Fever >/= 101)., Disp: 30 tablet, Rfl: 0   allopurinol  (ZYLOPRIM ) 100 MG tablet, Take 1 tablet (100 mg total) by mouth daily., Disp: 90 tablet, Rfl: 0   atorvastatin  (LIPITOR) 20 MG tablet, Take 1 tablet (20 mg total) by mouth daily., Disp: 90 tablet, Rfl: 3   cetirizine  (ZYRTEC  ALLERGY) 10 MG tablet, Take 1 tablet (10 mg total) by mouth daily., Disp: 30 tablet, Rfl: 2   DULoxetine  (CYMBALTA ) 30 MG capsule, Take 1 capsule (30 mg total) by mouth daily., Disp: 30 capsule, Rfl: 2   folic acid  (FOLVITE )  1 MG tablet, Take 1 tablet (1 mg total) by mouth daily., Disp: 30 tablet, Rfl: 2   gabapentin  (NEURONTIN ) 100 MG capsule, Take 100 mg by mouth daily., Disp: , Rfl:    levETIRAcetam  (KEPPRA ) 750 MG tablet, Take 1 tablet (750 mg total) by mouth 2 (two) times daily., Disp: 60 tablet, Rfl: 2   levothyroxine  (SYNTHROID ) 25 MCG tablet, Take 1 tablet (25 mcg total) by mouth daily before breakfast., Disp: 60 tablet, Rfl: 3   pantoprazole  (PROTONIX ) 40 MG tablet, Take 1 tablet (40 mg total) by mouth daily., Disp: 90 tablet, Rfl: 3   thiamine  (VITAMIN B1) 100 MG tablet, Take 1 tablet (100 mg total) by mouth daily., Disp: 30 tablet, Rfl: 0   diclofenac  Sodium (VOLTAREN ) 1 % GEL, Apply 4 g topically 4 (four) times daily. (Patient not taking: Reported on  12/31/2023), Disp: 150 g, Rfl: 3   feeding supplement (ENSURE ENLIVE / ENSURE PLUS) LIQD, Take 237 mLs by mouth 3 (three) times daily between meals. (Patient not taking: Reported on 08/26/2023), Disp: 237 mL, Rfl: 12   Multiple Vitamin (MULTIVITAMIN WITH MINERALS) TABS tablet, Take 1 tablet by mouth daily. (Patient not taking: Reported on 08/26/2023), Disp: 30 tablet, Rfl: 0   naltrexone  (DEPADE) 50 MG tablet, Take 1 tablet (50 mg total) by mouth daily. (Patient not taking: Reported on 12/31/2023), Disp: 30 tablet, Rfl: 1   ondansetron  (ZOFRAN -ODT) 4 MG disintegrating tablet, Take 1 tablet (4 mg total) by mouth every 8 (eight) hours as needed for nausea or vomiting. (Patient not taking: Reported on 12/31/2023), Disp: 20 tablet, Rfl: 0   predniSONE  (DELTASONE ) 20 MG tablet, Take 20 mg by mouth. (Patient not taking: Reported on 12/31/2023), Disp: , Rfl:   Vitals   Vitals:   12/31/23 2230 12/31/23 2232 12/31/23 2245 12/31/23 2300  BP: 100/60 100/60 107/75 (!) 106/59  Pulse: 83 79 88 90  Resp: 20 17 (!) 21 (!) 25  Temp: (!) 96.7 F (35.9 C) (!) 96.8 F (36 C) (!) 97 F (36.1 C) (!) 97.2 F (36.2 C)  TempSrc:      SpO2: 100% 100% 100% 100%  Weight:      Height:        Body mass index is 15.27 kg/m.   Physical Exam   General: Laying comfortably in bed; appears cachectic. in no acute distress.  HENT: Normal oropharynx and mucosa. Normal external appearance of ears and nose.  Neck: Supple, no pain or tenderness  CV: No JVD. No peripheral edema.  Pulmonary: Symmetric Chest rise. Normal respiratory effort.  Abdomen: Soft to touch, non-tender.  Ext: No cyanosis, edema, or deformity  Skin: No rash. Normal palpation of skin.   Musculoskeletal: Normal digits and nails by inspection. No clubbing.   Neurologic Examination  Mental status/Cognition: Alert, oriented to self, place, but thinks this is June of 2025. Poor attention.  Speech/language: Fluent, comprehension intact, object naming intact,  repetition intact. Cranial nerves:   CN II Pupils equal and reactive to light, counts fingers in all quadrants.   CN III,IV,VI EOM intact, no gaze preference or deviation, no nystagmus    CN V normal sensation in V1, V2, and V3 segments bilaterally    CN VII no asymmetry, no nasolabial fold flattening    CN VIII normal hearing to speech    CN IX & X normal palatal elevation, no uvular deviation    CN XI 5/5 head turn and 5/5 shoulder shrug bilaterally    CN XII midline tongue  protrusion    Motor:  Muscle bulk: poor, tone normal Poor effort secondary to pain. 5/5 grip strength BL but declines to lift arms off the bed secondary to shoulder pain BL that she states is chronic. 4/5 dorsiflexion and plantar flexion but again declines to lift legs off the bed citing gout.  Sensation:  Light touch Intact BL   Pin prick    Temperature    Vibration   Proprioception    Coordination/Complex Motor:  - Finger to Nose are normal - Heel to shin unable to get her to do. - Rapid alternating movement are intact BL. - Gait: Deferred.  Labs/Imaging/Neurodiagnostic studies   CBC:  Recent Labs  Lab Jan 19, 2024 1003 12/31/23 1636 12/31/23 1640  WBC 1.5*  --  8.0  NEUTROABS 0.6*  --  6.3  HGB 8.2* 9.9* 8.7*  HCT 23.3* 29.0* 25.6*  MCV 91.4  --  93.8  PLT 36*  --  65*   Basic Metabolic Panel:  Lab Results  Component Value Date   NA 134 (L) 12/31/2023   K 2.1 (LL) 12/31/2023   CO2 14 (L) 12/31/2023   GLUCOSE 79 12/31/2023   BUN 32 (H) 12/31/2023   CREATININE 2.62 (H) 12/31/2023   CALCIUM  4.3 (LL) 12/31/2023   GFRNONAA 21 (L) 12/31/2023   GFRAA 61 01/02/2020   Lipid Panel:  Lab Results  Component Value Date   LDLCALC 48 04/27/2022   HgbA1c:  Lab Results  Component Value Date   HGBA1C 3.7 (A) 05/26/2023   Urine Drug Screen:     Component Value Date/Time   LABOPIA NONE DETECTED 07/20/2023 1514   COCAINSCRNUR NONE DETECTED 07/20/2023 1514   LABBENZ NONE DETECTED 07/20/2023 1514    AMPHETMU NONE DETECTED 07/20/2023 1514   THCU NONE DETECTED 07/20/2023 1514   LABBARB NONE DETECTED 07/20/2023 1514    Alcohol Level     Component Value Date/Time   ETH 106 (H) 12/31/2023 1640   INR  Lab Results  Component Value Date   INR 1.2 01/19/24   APTT No results found for: APTT AED levels:  Lab Results  Component Value Date   LEVETIRACETA <2.0 (L) 01/23/2023    CT Head without contrast(Personally reviewed): CTH was negative for a large hypodensity concerning for a large territory infarct or hyperdensity concerning for an ICH  Neurodiagnostics rEEG:  Pending.  ASSESSMENT   AMBYR QADRI is a 58 y.o. female with hx of seizures as well as alcohol withdrawal and noncompliance with antiseizure meds who presents to the ED with presumed seizure. Had a witnessed seizure in the ED.  Overall, etiology of seizure suspected to be secondary to non compliance, significant electrolyte abnormalities. No clinical seizures noted on my evaluation and thou she is disoriented to month, she is able to follow commands in all extremities and answer questions appropriately, low suspicion for persistent seizures.  RECOMMENDATIONS  - correction of electrolyte imbalance per primary team. - agree with Keppra  load of 2500mg  IV once - continue Keppra  at home dose of 750mg  BID - rEEG in AM. - Ativan  1-2mg  for seizure lasting more than 3 mins. ______________________________________________________________________   Signed, Maia Handa, MD [facility]

## 2023-12-31 NOTE — H&P (Cosign Needed Addendum)
 NAME:  Christina Byrd, MRN:  [id number], DOB:  [birth year], LOS: 0 ADMISSION DATE:  12/31/2023, CONSULTATION DATE:  11/8 REFERRING MD: Charlyn, EDP CHIEF COMPLAINT: AMS   History of Present Illness:  58 year old female with past medical history of GERD, hypertension, alcohol use disorder, seizure disorder who presents to the ED with altered mental status. Per EMS report, called out by family as they found patient unresponsive. With EMS, found to have glucose of 29, hypotensive 70/40. Required BVM. On arrival to ED, hypothermic to 88 rectal. WBC 8, hgb 8.7, plt 65, NA 134, K 2.1, bicarb 14, BUN 32, sCr 2.62, calcium  4.3, AST 94, AG 23, ethanol 106, mag <0.5, lactic 5.5, UA +UTI,   Pertinent  Medical History  GERD, hypertension, alcohol use disorder, seizure disorder  Significant Hospital Events: Including procedures, antibiotic start and stop dates in addition to other pertinent events   11/8: ED for AMS, admit to ICU   Interim History / Subjective:  Significant other at bedside. States she has had some dry heaving and intermittent diarrhea over the past week. States she does not eat or drink enough. Daily alcohol use up to 1/5th liquor.   Objective   Blood pressure 118/86, pulse 95, temperature (!) 90.5 F (32.5 C), resp. rate 19, last menstrual period 08/23/2011, SpO2 91%.        Intake/Output Summary (Last 24 hours) at 12/31/2023 1919 Last data filed at 12/31/2023 1854 Gross per 24 hour  Intake 41.96 ml  Output 500 ml  Net -458.04 ml   There were no vitals filed for this visit.  Examination: General: appears older than stated age, frail, laying in bed, acutely ill appearing  HENT: ncat, anicteric sclera, mm dry  Lungs: ctab, room air, resp even and unlabored  Cardiovascular: s1s2, no murmur, no edema Abdomen: soft, ntnd, +BS Extremities: no edema Neuro: awake and alert, can be slow to respond, confused to year/month or why she is here; does not answer all questions;  grossly tremulous in bilateral hands, no focal weakness; during interview one episode of staring past examiner and lip smacking, brief  GU: foley   Resolved Hospital Problem list    Assessment & Plan:  Altered mental status  Concern for seizure activity  History of epilepsy, non compliant on home medication  Multifactorial in setting of chronic heavy alcohol use, hypoglycemia, c/f seizures at home and in ED and severe metabolic derangements. CT head negative. UA with UTI.  - check tsh, t4, ammonia, cortisol  - check UDS  - consulted neuro regarding seizure activity, loaded with Keppra  2.5g in ED  - spot EEG now  - treat etoh, uti and elyte derangements as below  - seizure precautions, delirium precautions  - frequent neuro checks   Sepsis  Hypothermia  Lactic acidosis Hypothermia to 88 on arrival; no leukocytosis. UA equivocal for UTI. CXR without pneumonia. Suspect lactic acidosis is from seizure activity and not hypoperfusion as she is not hypotensive on exam. No evidence of ischemia.  - send urine culture  - f/u blood cultures, MRSA PCR  - con't zosyn  for now, received vanc/zosyn  in ED  - trend fever, wbc, lactic curves  - con't bair hugger   Alcohol use disorder  Alcohol withdrawal  Per significant other at bedside, drinks liquor up to 1/5 day. States she is drinking all the time. Last drink 0300 11/8. Grossly tremulous on exam. 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 CK 453. UA equivocal UTI - f/u urine culture  - trend bmp, mag, phos - replete elytes - strict I&O - f/u urine studies - Avoid nephrotoxic agents, renally dose  medications - ensure adequate renal perfusion   Severe metabolic derangements  Prolonged Qtc  Hypokalemia Hypomagnesemia  Hypocalcemia  K 2.1, Ca 4.3 and mag <0.5 in ED  - received 4g mag and 20mEq K in ED - ionized Ca in process  - repeat BMP, mag now and add on phos  - ongoing repletion - give 20 IV K and 40 PO K, 2g calcium  gluconate - recheck labs  - check EKG for Qtc monitoring - 562 in ED  - tele monitoring  - avoid Qtc prolonging agents as able   GERD  - ppi daily   Hypothyroidism  - check TSH, T4  - start home synthroid  25mcg daily   History of hypertension  hyperlipidemia - BP stable now without need for PRN  - clinically monitor  - con't statin   Severe protein calorie malnutrition  - consult RD for recs    Labs   CBC: Recent Labs  Lab 12/27/23 1003 12/31/23 1636 12/31/23 1640  WBC 1.5*  --  8.0  NEUTROABS 0.6*  --  6.3  HGB 8.2* 9.9* 8.7*  HCT 23.3* 29.0* 25.6*  MCV 91.4  --  93.8  PLT 36*  --  65*    Basic Metabolic Panel: Recent Labs  Lab 12/31/23 1636 12/31/23 1640  NA 135 134*  K <2.0* 2.1*  CL  --  97*  CO2  --  14*  GLUCOSE  --  79  BUN  --  32*  CREATININE  --  2.62*  CALCIUM   --  4.3*  MG  --  <0.5*   GFR: CrCl cannot be calculated (Unknown ideal weight.). Recent Labs  Lab 12/27/23 1003 12/31/23 1637 12/31/23 1640 12/31/23 1854  WBC 1.5*  --  8.0  --   LATICACIDVEN  --  5.5*  --  9.7*    Liver Function Tests: Recent Labs  Lab 12/31/23 1640  AST 94*  ALT 14  ALKPHOS 95  BILITOT 0.6  PROT 6.1*  ALBUMIN 2.3*   No results for input(s): LIPASE, AMYLASE in the last 168 hours. No results for input(s): AMMONIA in the last 168 hours.  ABG    Component Value Date/Time   HCO3 14.5 (L) 12/31/2023 1636   TCO2 15 (L) 12/31/2023 1636   ACIDBASEDEF 12.0 (H) 12/31/2023 1636   O2SAT 74 12/31/2023 1636     Coagulation Profile: Recent Labs  Lab 12/27/23 1003  INR 1.2    Cardiac Enzymes: No results for  input(s): CKTOTAL, CKMB, CKMBINDEX, TROPONINI in the last 168 hours.  HbA1C: Hemoglobin A1C  Date/Time Value Ref Range Status  05/26/2023 04:40 PM 3.7 (A) 4.0 - 5.6 % Final    Comment:    Results reported to Dr Harrie 04-03-025 1652 T Fulcher, PBT   HbA1c, POC (controlled diabetic range)  Date/Time Value Ref Range Status  03/24/2021 11:05 AM 5.5 0.0 - 7.0 % Final  11/17/2020 04:13 PM 5.4 0.0 - 7.0 % Final   Hgb A1c MFr Bld  Date/Time Value Ref Range  Status  07/11/2022 04:03 AM 5.0 4.8 - 5.6 % Final    Comment:    (NOTE) Pre diabetes:          5.7%-6.4%  Diabetes:              >6.4%  Glycemic control for   <7.0% adults with diabetes   04/27/2022 11:25 AM 4.6 (L) 4.8 - 5.6 % Final    Comment:             Prediabetes: 5.7 - 6.4          Diabetes: >6.4          Glycemic control for adults with diabetes: <7.0     CBG: Recent Labs  Lab 12/31/23 1604 12/31/23 1631 12/31/23 1719 12/31/23 1808  GLUCAP 84 84 98 68*    Review of Systems:   As above  Past Medical History:  She,  has a past medical history of GASTROESOPHAGEAL REFLUX, NO ESOPHAGITIS (04/21/2006), GERD (gastroesophageal reflux disease) (Dx 1995), Hot flash, menopausal (06/22/2016), Hot flashes (01/30/2020), HTN (hypertension), Hypotension (10/21/2022), Intertrigo (06/22/2016), Nausea & vomiting (12/23/2022), Nausea and vomiting (12/24/2022), Pain and swelling of left ankle (12/02/2014), Seizures (HCC), Tendinitis of right hip flexor (07/23/2014), and Tendonitis, Achilles, right (04/01/2014).   Surgical History:   Past Surgical History:  Procedure Laterality Date   BIOPSY  06/03/2022   Procedure: BIOPSY;  Surgeon: Stacia Glendia BRAVO, MD;  Location: [facility] ENDOSCOPY;  Service: Gastroenterology;;   COLONOSCOPY N/A 06/21/2023   Procedure: COLONOSCOPY;  Surgeon: Albertus Gordy HERO, MD;  Location: [facility] ENDOSCOPY;  Service: Gastroenterology;  Laterality: N/A;   ESOPHAGOGASTRODUODENOSCOPY N/A 12/29/2022   Procedure:  ESOPHAGOGASTRODUODENOSCOPY (EGD);  Surgeon: Nandigam, Kavitha V, MD;  Location: [facility] ENDOSCOPY;  Service: Gastroenterology;  Laterality: N/A;   ESOPHAGOGASTRODUODENOSCOPY N/A 06/21/2023   Procedure: EGD (ESOPHAGOGASTRODUODENOSCOPY);  Surgeon: Albertus Gordy HERO, MD;  Location: [facility] ENDOSCOPY;  Service: Gastroenterology;  Laterality: N/A;   ESOPHAGOGASTRODUODENOSCOPY (EGD) WITH PROPOFOL  N/A 06/03/2022   Procedure: ESOPHAGOGASTRODUODENOSCOPY (EGD) WITH PROPOFOL ;  Surgeon: Stacia Glendia BRAVO, MD;  Location: [facility] ENDOSCOPY;  Service: Gastroenterology;  Laterality: N/A;   HOT HEMOSTASIS N/A 06/03/2022   Procedure: HOT HEMOSTASIS (ARGON PLASMA COAGULATION/BICAP);  Surgeon: Stacia Glendia BRAVO, MD;  Location: [facility] ENDOSCOPY;  Service: Gastroenterology;  Laterality: N/A;     Social History:   reports that she quit smoking about 11 years ago. Her smoking use included cigarettes. She has never used smokeless tobacco. She reports that she does not currently use alcohol after a past usage of about 49.0 standard drinks of alcohol per week. She reports that she does not use drugs.   Family History:  Her family history includes CAD in her mother.   Allergies Allergies  Allergen Reactions   Levaquin  [Levofloxacin  In D5w] Rash   Losartan  Potassium Rash   Ace Inhibitors Cough   Cefepime  Rash    09/04/12 pm Patient started to break out with small macules after IV Vanco infusion, then macules increased in size after starting cefepime  infusion.   Chlorhexidine Itching and Rash   Codeine  Nausea Only   Vancomycin  Rash    09/04/12 pm Patient started to break out with small macules after IV Vanco infusion, then macules increased in size after starting cefepime  infusion. Tolerated in May 2025     Home Medications  Prior to Admission medications   Medication Sig Start Date End Date Taking? Authorizing Provider  acetaminophen  (TYLENOL ) 325 MG tablet Take 2 tablets (650 mg total) by mouth every 6 (six) hours as needed  for mild  pain (pain score 1-3) or fever (or Fever >/= 101). 07/27/23   Kandis Perkins, DO  allopurinol  (ZYLOPRIM ) 100 MG tablet Take 1 tablet (100 mg total) by mouth daily. 12/09/23   Harrie Bruckner, DO  atorvastatin  (LIPITOR) 20 MG tablet Take 1 tablet (20 mg total) by mouth daily. 10/20/23   Shawn Sick, MD  cetirizine  (ZYRTEC  ALLERGY) 10 MG tablet Take 1 tablet (10 mg total) by mouth daily. 12/08/23 12/07/24  Harrie Bruckner, DO  diclofenac  Sodium (VOLTAREN ) 1 % GEL Apply 4 g topically 4 (four) times daily. 10/27/23   Harrie Bruckner, DO  DULoxetine  (CYMBALTA ) 30 MG capsule Take 1 capsule (30 mg total) by mouth daily. 10/20/23 10/19/24  Shawn Sick, MD  feeding supplement (ENSURE ENLIVE / ENSURE PLUS) LIQD Take 237 mLs by mouth 3 (three) times daily between meals. Patient not taking: Reported on 10/21/2023 12/30/22   Fernand Prost, MD  folic acid  (FOLVITE ) 1 MG tablet Take 1 tablet (1 mg total) by mouth daily. 12/08/23   Harrie Bruckner, DO  levETIRAcetam  (KEPPRA ) 750 MG tablet Take 1 tablet (750 mg total) by mouth 2 (two) times daily. 12/08/23   Harrie Bruckner, DO  levothyroxine  (SYNTHROID ) 25 MCG tablet Take 1 tablet (25 mcg total) by mouth daily before breakfast. 10/20/23   Shawn Sick, MD  magnesium  oxide (MAG-OX) 400 (240 Mg) MG tablet Take 1 tablet (400 mg total) by mouth daily. Patient not taking: Reported on 10/21/2023 07/27/23   Kandis Perkins, DO  melatonin 3 MG TABS tablet Take 1 tablet (3 mg total) by mouth at bedtime. 10/20/23   Norrine Sharper, MD  Multiple Vitamin (MULTIVITAMIN WITH MINERALS) TABS tablet Take 1 tablet by mouth daily. Patient not taking: Reported on 10/21/2023 05/03/23   Harrie Bruckner, DO  naltrexone  (DEPADE) 50 MG tablet Take 1 tablet (50 mg total) by mouth daily. 10/27/23   Harrie Bruckner, DO  ondansetron  (ZOFRAN -ODT) 4 MG disintegrating tablet Take 1 tablet (4 mg total) by mouth every 8 (eight) hours as needed for nausea or vomiting. 12/08/23   Harrie Bruckner, DO  pantoprazole  (PROTONIX ) 40 MG tablet Take 1 tablet (40 mg total) by mouth daily. 12/08/23   Harrie Bruckner, DO  predniSONE  (DELTASONE ) 20 MG tablet Take 20 mg by mouth. 12/21/23   [provider]  thiamine  (VITAMIN B1) 100 MG tablet Take 1 tablet (100 mg total) by mouth daily. 12/08/23   Harrie Bruckner, DO  metFORMIN  (GLUCOPHAGE ) 500 MG tablet Take 0.5 tablets (250 mg total) by mouth daily with breakfast. 03/05/20 04/02/20  Danton Jon HERO, PA-C     Critical care time: 68   Tinnie FORBES Adolph [facility]ON[facility] 12/31/23 7:26 PM  Please see [email or web address] for pager details.  From 7A-7P if no response, please call [phone] After hours, please call ELink [phone]

## 2023-12-31 NOTE — ED Provider Notes (Signed)
 [place] EMERGENCY DEPARTMENT AT [facility] Provider Note  MDM   HPI/ROS:  Christina Byrd is a 58 y.o. female with a PMH of alcohol use disorder, previous seizures, severe malnutrition, electrolyte disturbances who presents with altered mental status.  Patient BIBEMS from home D/T family assuming she was having a seizure given her history when they went in her room and found her unresponsive. On EMS arrival, patient was altered, CBG 29 and hypotensive 70/40. Patient was given 1mg  Glucagob given, and then BVM used d/t unresponsiveness, IO drilled and patient given D50.  Additional history obtained from patient's significant other [place] whose information in the chart.  States that he checked on her while she was in her bed and she was unresponsive and called.  States that she has been noncompliant with her seizure medication, has been eating less than typical.  On my initial evaluation, patient is:  -Vital signs significant for normotension, hypothermia and 88.1 via rectal temperature, HR of 80, saturating 100% on RA with 15 RR.  Patient placed on Bair warmer, toxic appearing.  Physical exam is notable for: - Patient is altered, initial GCS of 11, opens eyes spontaneously, incomprehensible sounds and moves to localize pain. -- Lungs are CTAB, abdomen is soft, nondistended, nonrigid, nontender  Given her vital signs and altered mental status, patient was made a code sepsis and initiated on fluids as well as vancomycin  and Zosyn .  Patient had noted that she had tolerated vancomycin  despite an allergy listed in the chart within the last few months. On review of patient's medical records, had an admission August 2025 for Severe malnutrition with symptomatic hypoglycemia, electrolyte disturbances, alcohol use disorder with Warnicke's encephalopathy, AKI.-  Patient's initial VBG 7.2 51/33/14 0.5 with critical values of a potassium less than 2 and a calcium  ionized of 0.55.  On CMP,  potassium of 2.1, chloride of 97, CO2 of 14, elevated BUN and creatinine at 32 and 2.62 consistent with an AKI.  Calcium  is 4.3 and anion gap of 23.  Initial lactic acid of 5.5.,  CBC without leukocytosis, hemoglobin at 8.7, EtOH 106.  Was called to bedside by patient's nurse because she had left lateral gaze deviation, this time did not desaturate and no change in pressure but was unresponsive to painful stimuli which was a change from prior, concern for seizure activity.  Before Ativan  can be drawn within approximately 1.5 minutes, seizure self aborted.  Patient was then Keppra  loaded. Patient is repeat lactic uptrending at 9.7.  This could be due to to seizure-like activity.  CXR without any acute cardiopulmonary abnormality.  Patient received 2 L LR, vancomycin , Zosyn , magnesium  4 g, potassium 10 mill equivalents x 2, Keppra  60 mg/kg.  Given her uptrending lactic as well as severe metabolic derangements and patient hypothermic persistently requiring Bair hugger, patient was discussed with ICU/intensivist for admission. Interpretations, interventions, and the patient's course of care are documented below.     Disposition:  I discussed the case with intensivist who graciously agreed to admit the patient to their service for continued care.   Clinical Impression:  1. Hypoglycemia   2. Hypokalemia   3. Weakness   4. Altered mental status, unspecified altered mental status type   5. Hypomagnesemia   6. AKI (acute kidney injury)   7. Shock (HCC)   8. Hypothermia, initial encounter     Clinical Complexity A medically appropriate history, review of systems, and physical exam was performed.  My independent interpretations of EKG, labs, and radiology  are documented in the ED course above.   If decision rules were used in this patient's evaluation, they are listed below.   Click here for ABCD2, HEART and other calculatorsREFRESH Note before signing   Patient's presentation is most  consistent with acute presentation with potential threat to life or bodily function.  Medical Decision Making Amount and/or Complexity of Data Reviewed Labs: ordered. Radiology: ordered.  Risk Prescription drug management. Decision regarding hospitalization.    HPI/ROS      See MDM section for pertinent HPI and ROS. A complete ROS was performed with pertinent positives/negatives noted above.   Past Medical History:  Diagnosis Date   GASTROESOPHAGEAL REFLUX, NO ESOPHAGITIS 04/21/2006   Qualifier: Diagnosis of  By: WATT, JOANNE     GERD (gastroesophageal reflux disease) Dx 1995   Hot flash, menopausal 06/22/2016   Hot flashes 01/30/2020   HTN (hypertension)    Hypotension 10/21/2022   Patient's blood pressure at presentation was 87/65, follow up blood pressure was 81/58. Patient reports regurgitating everything she has taken po for the past month. Denies lightheadedness or dizziness. Discussed GI referral, though patient stated she would be going to the emergency room today for IV fluids.   -GI referral for upper endoscopy placed  -Patient planning on going to the ED for IV flu   Intertrigo 06/22/2016   Nausea & vomiting 12/23/2022   Nausea and vomiting 12/24/2022   Pain and swelling of left ankle 12/02/2014   Seizures (HCC)    Tendinitis of right hip flexor 07/23/2014   Tendonitis, Achilles, right 04/01/2014    Past Surgical History:  Procedure Laterality Date   BIOPSY  06/03/2022   Procedure: BIOPSY;  Surgeon: Stacia Glendia BRAVO, MD;  Location: [facility] ENDOSCOPY;  Service: Gastroenterology;;   COLONOSCOPY N/A 06/21/2023   Procedure: COLONOSCOPY;  Surgeon: Albertus Gordy HERO, MD;  Location: [facility] ENDOSCOPY;  Service: Gastroenterology;  Laterality: N/A;   ESOPHAGOGASTRODUODENOSCOPY N/A 12/29/2022   Procedure: ESOPHAGOGASTRODUODENOSCOPY (EGD);  Surgeon: Nandigam, Kavitha V, MD;  Location: [facility] ENDOSCOPY;  Service: Gastroenterology;  Laterality: N/A;   ESOPHAGOGASTRODUODENOSCOPY N/A 06/21/2023    Procedure: EGD (ESOPHAGOGASTRODUODENOSCOPY);  Surgeon: Albertus Gordy HERO, MD;  Location: [facility] ENDOSCOPY;  Service: Gastroenterology;  Laterality: N/A;   ESOPHAGOGASTRODUODENOSCOPY (EGD) WITH PROPOFOL  N/A 06/03/2022   Procedure: ESOPHAGOGASTRODUODENOSCOPY (EGD) WITH PROPOFOL ;  Surgeon: Stacia Glendia BRAVO, MD;  Location: [facility] ENDOSCOPY;  Service: Gastroenterology;  Laterality: N/A;   HOT HEMOSTASIS N/A 06/03/2022   Procedure: HOT HEMOSTASIS (ARGON PLASMA COAGULATION/BICAP);  Surgeon: Stacia Glendia BRAVO, MD;  Location: [facility] ENDOSCOPY;  Service: Gastroenterology;  Laterality: N/A;      Physical Exam   There were no vitals filed for this visit.  Physical Exam Vitals and nursing note reviewed.  Constitutional:      General: She is in acute distress.     Appearance: She is well-developed. She is toxic-appearing.  HENT:     Head: Normocephalic and atraumatic.  Eyes:     Conjunctiva/sclera: Conjunctivae normal.  Cardiovascular:     Rate and Rhythm: Normal rate and regular rhythm.     Heart sounds: No murmur heard. Pulmonary:     Effort: Pulmonary effort is normal. No respiratory distress.     Breath sounds: Normal breath sounds.  Abdominal:     Palpations: Abdomen is soft.     Tenderness: There is no abdominal tenderness.  Musculoskeletal:        General: No swelling.     Cervical back: Neck supple.  Skin:    General: Skin  is warm and dry.     Capillary Refill: Capillary refill takes less than 2 seconds.  Neurological:     Comments: Patient is altered, initial GCS of 11, opens eyes spontaneously, incomprehensible sounds and moves to localize pain.      Procedures   If procedures were preformed on this patient, they are listed below:  Procedures   Please note that this documentation was produced with the assistance of voice-to-text technology and may contain errors.     Billy Pal, MD 12/31/23 2322    Charlyn Sora, MD 01/01/24 1545

## 2023-12-31 NOTE — ED Notes (Signed)
 Sister Flint Brewster [phone] would like an update asap

## 2023-12-31 NOTE — ED Notes (Signed)
 I-stat results shown to Dr. Charlyn.

## 2023-12-31 NOTE — ED Triage Notes (Signed)
 Pt BIB [facility] from home d/t family assuming she was having a seizure (does have Hx of them). EMS arrived & pt was altered, CBG was 29 & BP was 70/40, 1mg  Glucagon given & was bagged d/t being unresponsive & then her CBG was reading Low, IO to Lt tib/fib started & she received 1/2 bag of D10. She then became more responsive but still altered, baseline orientation A/Ox4 (per family on scene). Last SBP was 100 & CBG before arrival was 74, also has a 24g Lt AC PIV.

## 2023-12-31 NOTE — ED Notes (Signed)
 Lactic results to dylan g.rn by at

## 2023-12-31 NOTE — Progress Notes (Addendum)
 Pharmacy Antibiotic Note  Christina Byrd is a 58 y.o. female for which pharmacy has been consulted for zosyn  dosing for sepsis and UTI.  Estimated Creatinine Clearance: 15.9 mL/min (A) (by C-G formula based on SCr of 2.62 mg/dL (H)).  Plan: Zosyn  2.25g IV q8h Monitor WBC, fever, renal function, cultures De-escalate when able  Height: 5' 6 (167.6 cm) Weight: 42.9 kg (94 lb 9.2 oz) IBW/kg (Calculated) : 59.3  Temp (24hrs), Avg:91.2 F (32.9 C), Min:88.1 F (31.2 C), Max:95.1 F (35.1 C)  Recent Labs  Lab 12/27/23 1003 12/31/23 1637 12/31/23 1640 12/31/23 1854  WBC 1.5*  --  8.0  --   CREATININE  --   --  2.62*  --   LATICACIDVEN  --  5.5*  --  9.7*    Estimated Creatinine Clearance: 15.9 mL/min (A) (by C-G formula based on SCr of 2.62 mg/dL (H)).    Allergies  Allergen Reactions   Levaquin  [Levofloxacin  In D5w] Rash   Losartan  Potassium Rash   Ace Inhibitors Cough   Cefepime  Rash    09/04/12 pm Patient started to break out with small macules after IV Vanco infusion, then macules increased in size after starting cefepime  infusion.   Chlorhexidine Itching and Rash   Codeine  Nausea Only   Vancomycin  Rash    09/04/12 pm Patient started to break out with small macules after IV Vanco infusion, then macules increased in size after starting cefepime  infusion. Tolerated in May 2025   Microbiology results: Pending  Thank you for allowing pharmacy to be a part of this patient's care.  Dorn Buttner, PharmD, BCPS 12/31/2023 9:09 PM ED Clinical Pharmacist -  [phone]

## 2024-01-01 ENCOUNTER — Other Ambulatory Visit: Payer: Self-pay

## 2024-01-01 ENCOUNTER — Inpatient Hospital Stay (HOSPITAL_COMMUNITY)

## 2024-01-01 DIAGNOSIS — A419 Sepsis, unspecified organism: Secondary | ICD-10-CM | POA: Diagnosis not present

## 2024-01-01 DIAGNOSIS — F1090 Alcohol use, unspecified, uncomplicated: Secondary | ICD-10-CM

## 2024-01-01 DIAGNOSIS — G9341 Metabolic encephalopathy: Secondary | ICD-10-CM

## 2024-01-01 DIAGNOSIS — E162 Hypoglycemia, unspecified: Secondary | ICD-10-CM | POA: Diagnosis not present

## 2024-01-01 DIAGNOSIS — R9431 Abnormal electrocardiogram [ECG] [EKG]: Secondary | ICD-10-CM | POA: Diagnosis not present

## 2024-01-01 DIAGNOSIS — R569 Unspecified convulsions: Secondary | ICD-10-CM | POA: Diagnosis not present

## 2024-01-01 LAB — PHOSPHORUS
Phosphorus: 11.8 mg/dL — ABNORMAL HIGH (ref 2.5–4.6)
Phosphorus: 5.1 mg/dL — ABNORMAL HIGH (ref 2.5–4.6)
Phosphorus: 5.2 mg/dL — ABNORMAL HIGH (ref 2.5–4.6)

## 2024-01-01 LAB — CBC WITH DIFFERENTIAL/PLATELET
Basophils Absolute: 0 K/uL (ref 0.0–0.1)
Basophils Relative: 0 %
Eosinophils Absolute: 0 K/uL (ref 0.0–0.5)
Eosinophils Relative: 0 %
HCT: 16.9 % — ABNORMAL LOW (ref 36.0–46.0)
Hemoglobin: 6 g/dL — CL (ref 12.0–15.0)
Lymphocytes Relative: 4 %
Lymphs Abs: 0.5 K/uL — ABNORMAL LOW (ref 0.7–4.0)
MCH: 31.4 pg (ref 26.0–34.0)
MCHC: 35.5 g/dL (ref 30.0–36.0)
MCV: 88.5 fL (ref 80.0–100.0)
Monocytes Absolute: 0.5 K/uL (ref 0.1–1.0)
Monocytes Relative: 4 %
Neutro Abs: 10.7 K/uL — ABNORMAL HIGH (ref 1.7–7.7)
Neutrophils Relative %: 92 %
Platelets: 67 K/uL — ABNORMAL LOW (ref 150–400)
RBC: 1.91 MIL/uL — ABNORMAL LOW (ref 3.87–5.11)
RDW: 15.6 % — ABNORMAL HIGH (ref 11.5–15.5)
WBC: 11.6 K/uL — ABNORMAL HIGH (ref 4.0–10.5)
nRBC: 0 % (ref 0.0–0.2)

## 2024-01-01 LAB — GLUCOSE, CAPILLARY
Glucose-Capillary: 157 mg/dL — ABNORMAL HIGH (ref 70–99)
Glucose-Capillary: 26 mg/dL — CL (ref 70–99)
Glucose-Capillary: 119 mg/dL — ABNORMAL HIGH (ref 70–99)
Glucose-Capillary: 155 mg/dL — ABNORMAL HIGH (ref 70–99)
Glucose-Capillary: 150 mg/dL — ABNORMAL HIGH (ref 70–99)
Glucose-Capillary: 233 mg/dL — ABNORMAL HIGH (ref 70–99)
Glucose-Capillary: 184 mg/dL — ABNORMAL HIGH (ref 70–99)
Glucose-Capillary: 244 mg/dL — ABNORMAL HIGH (ref 70–99)

## 2024-01-01 LAB — BASIC METABOLIC PANEL WITH GFR
Anion gap: 23 — ABNORMAL HIGH (ref 5–15)
Anion gap: 18 — ABNORMAL HIGH (ref 5–15)
Anion gap: 14 (ref 5–15)
Anion gap: 14 (ref 5–15)
Anion gap: 16 — ABNORMAL HIGH (ref 5–15)
BUN: 32 mg/dL — ABNORMAL HIGH (ref 6–20)
BUN: 28 mg/dL — ABNORMAL HIGH (ref 6–20)
BUN: 24 mg/dL — ABNORMAL HIGH (ref 6–20)
BUN: 24 mg/dL — ABNORMAL HIGH (ref 6–20)
BUN: 23 mg/dL — ABNORMAL HIGH (ref 6–20)
CO2: 14 mmol/L — ABNORMAL LOW (ref 22–32)
CO2: 16 mmol/L — ABNORMAL LOW (ref 22–32)
CO2: 17 mmol/L — ABNORMAL LOW (ref 22–32)
CO2: 16 mmol/L — ABNORMAL LOW (ref 22–32)
CO2: 14 mmol/L — ABNORMAL LOW (ref 22–32)
Calcium: 5.2 mg/dL — CL (ref 8.9–10.3)
Calcium: 6 mg/dL — CL (ref 8.9–10.3)
Calcium: 5.5 mg/dL — CL (ref 8.9–10.3)
Calcium: 6.1 mg/dL — CL (ref 8.9–10.3)
Calcium: 6.4 mg/dL — CL (ref 8.9–10.3)
Chloride: 96 mmol/L — ABNORMAL LOW (ref 98–111)
Chloride: 97 mmol/L — ABNORMAL LOW (ref 98–111)
Chloride: 95 mmol/L — ABNORMAL LOW (ref 98–111)
Chloride: 102 mmol/L (ref 98–111)
Chloride: 103 mmol/L (ref 98–111)
Creatinine, Ser: 2.44 mg/dL — ABNORMAL HIGH (ref 0.44–1.00)
Creatinine, Ser: 2.23 mg/dL — ABNORMAL HIGH (ref 0.44–1.00)
Creatinine, Ser: 2.04 mg/dL — ABNORMAL HIGH (ref 0.44–1.00)
Creatinine, Ser: 2.01 mg/dL — ABNORMAL HIGH (ref 0.44–1.00)
Creatinine, Ser: 2.05 mg/dL — ABNORMAL HIGH (ref 0.44–1.00)
GFR, Estimated: 22 mL/min — ABNORMAL LOW (ref >60)
GFR, Estimated: 25 mL/min — ABNORMAL LOW (ref >60)
GFR, Estimated: 28 mL/min — ABNORMAL LOW (ref >60)
GFR, Estimated: 28 mL/min — ABNORMAL LOW (ref >60)
GFR, Estimated: 28 mL/min — ABNORMAL LOW (ref >60)
Glucose, Bld: 37 mg/dL — CL (ref 70–99)
Glucose, Bld: 130 mg/dL — ABNORMAL HIGH (ref 70–99)
Glucose, Bld: 478 mg/dL — ABNORMAL HIGH (ref 70–99)
Glucose, Bld: 205 mg/dL — ABNORMAL HIGH (ref 70–99)
Glucose, Bld: 192 mg/dL — ABNORMAL HIGH (ref 70–99)
Potassium: 2 mmol/L — CL (ref 3.5–5.1)
Potassium: 3 mmol/L — ABNORMAL LOW (ref 3.5–5.1)
Potassium: 6.8 mmol/L (ref 3.5–5.1)
Potassium: 3.7 mmol/L (ref 3.5–5.1)
Potassium: 3.7 mmol/L (ref 3.5–5.1)
Sodium: 133 mmol/L — ABNORMAL LOW (ref 135–145)
Sodium: 131 mmol/L — ABNORMAL LOW (ref 135–145)
Sodium: 126 mmol/L — ABNORMAL LOW (ref 135–145)
Sodium: 132 mmol/L — ABNORMAL LOW (ref 135–145)
Sodium: 133 mmol/L — ABNORMAL LOW (ref 135–145)

## 2024-01-01 LAB — ECHOCARDIOGRAM COMPLETE
AR max vel: 2.21 cm2
AV Area VTI: 2.24 cm2
AV Area mean vel: 2.23 cm2
AV Mean grad: 2.2 mmHg
AV Peak grad: 4.2 mmHg
Ao pk vel: 1.02 m/s
Area-P 1/2: 5.97 cm2
Height: 66 in
S' Lateral: 2.85 cm
Weight: 1622.59 [oz_av]

## 2024-01-01 LAB — MAGNESIUM
Magnesium: 1.7 mg/dL (ref 1.7–2.4)
Magnesium: 2.4 mg/dL (ref 1.7–2.4)
Magnesium: 2.4 mg/dL (ref 1.7–2.4)
Magnesium: 2.5 mg/dL — ABNORMAL HIGH (ref 1.7–2.4)

## 2024-01-01 LAB — HEMOGLOBIN A1C
Hgb A1c MFr Bld: 5.2 % (ref 4.8–5.6)
Mean Plasma Glucose: 102.54 mg/dL

## 2024-01-01 LAB — RAPID URINE DRUG SCREEN, HOSP PERFORMED
Amphetamines: NOT DETECTED
Barbiturates: POSITIVE — AB
Benzodiazepines: NOT DETECTED
Cocaine: NOT DETECTED
Opiates: NOT DETECTED
Tetrahydrocannabinol: NOT DETECTED

## 2024-01-01 LAB — MRSA NEXT GEN BY PCR, NASAL: MRSA by PCR Next Gen: NOT DETECTED

## 2024-01-01 LAB — HEMOGLOBIN AND HEMATOCRIT, BLOOD
HCT: 16.6 % — ABNORMAL LOW (ref 36.0–46.0)
HCT: 29.6 % — ABNORMAL LOW (ref 36.0–46.0)
Hemoglobin: 5.7 g/dL — CL (ref 12.0–15.0)
Hemoglobin: 10.1 g/dL — ABNORMAL LOW (ref 12.0–15.0)

## 2024-01-01 LAB — LACTIC ACID, PLASMA
Lactic Acid, Venous: 1.9 mmol/L (ref 0.5–1.9)
Lactic Acid, Venous: 1.9 mmol/L (ref 0.5–1.9)

## 2024-01-01 LAB — PREPARE RBC (CROSSMATCH)

## 2024-01-01 LAB — AMMONIA: Ammonia: 34 umol/L (ref 9–35)

## 2024-01-01 MED ORDER — SODIUM CHLORIDE 0.9% IV SOLUTION: Freq: Once | INTRAVENOUS | Status: AC

## 2024-01-01 MED ORDER — SODIUM BICARBONATE 8.4 % IV SOLN
INTRAVENOUS | Status: AC
  Filled 2024-01-01: qty 150

## 2024-01-01 MED ORDER — ONDANSETRON HCL 4 MG/2ML IJ SOLN: 4.0000 mg | Freq: Four times a day (QID) | INTRAMUSCULAR | Status: DC | PRN

## 2024-01-01 MED ORDER — NOREPINEPHRINE 4 MG/250ML-% IV SOLN
0.0000 ug/min | INTRAVENOUS | Status: DC
Start: 2024-01-01 — End: 2024-01-01

## 2024-01-01 MED ORDER — CALCIUM GLUCONATE-NACL 1-0.675 GM/50ML-% IV SOLN
1.0000 g | Freq: Once | INTRAVENOUS | Status: AC
  Administered 2024-01-01: 1000 mg via INTRAVENOUS
  Filled 2024-01-01: qty 50

## 2024-01-01 MED ORDER — PIPERACILLIN-TAZOBACTAM 3.375 G IVPB
3.3750 g | Freq: Three times a day (TID) | INTRAVENOUS | Status: DC
  Administered 2024-01-01 – 2024-01-03 (×6): 3.375 g via INTRAVENOUS
  Filled 2024-01-01 (×6): qty 50

## 2024-01-01 MED ORDER — POTASSIUM PHOSPHATES 15 MMOLE/5ML IV SOLN
30.0000 mmol | Freq: Once | INTRAVENOUS | Status: AC
  Administered 2024-01-01: 30 mmol via INTRAVENOUS
  Filled 2024-01-01: qty 10

## 2024-01-01 MED ORDER — GERHARDT'S BUTT CREAM
TOPICAL_CREAM | Freq: Three times a day (TID) | CUTANEOUS | Status: DC
  Administered 2024-01-02 – 2024-01-03 (×2): 1 via TOPICAL
  Filled 2024-01-01 (×3): qty 60

## 2024-01-01 MED ORDER — POTASSIUM CHLORIDE 20 MEQ PO PACK
20.0000 meq | PACK | Freq: Once | ORAL | Status: AC
  Administered 2024-01-01: 20 meq via ORAL
  Filled 2024-01-01: qty 1

## 2024-01-01 MED ORDER — CALCIUM GLUCONATE-NACL 2-0.675 GM/100ML-% IV SOLN
2.0000 g | Freq: Once | INTRAVENOUS | Status: AC
  Administered 2024-01-01: 2000 mg via INTRAVENOUS
  Filled 2024-01-01: qty 100

## 2024-01-01 MED ORDER — OXYCODONE HCL 5 MG PO TABS
5.0000 mg | ORAL_TABLET | Freq: Four times a day (QID) | ORAL | Status: DC | PRN
  Administered 2024-01-01: 5 mg via ORAL
  Filled 2024-01-01: qty 1

## 2024-01-01 MED ORDER — THIAMINE MONONITRATE 100 MG PO TABS
500.0000 mg | ORAL_TABLET | Freq: Three times a day (TID) | ORAL | Status: AC
  Administered 2024-01-01 – 2024-01-03 (×4): 500 mg via ORAL
  Filled 2024-01-01 (×4): qty 5

## 2024-01-01 MED ORDER — POTASSIUM CHLORIDE 20 MEQ PO PACK
80.0000 meq | PACK | Freq: Once | ORAL | Status: AC
  Administered 2024-01-01: 80 meq via ORAL
  Filled 2024-01-01: qty 4

## 2024-01-01 MED ORDER — POTASSIUM CHLORIDE 10 MEQ/100ML IV SOLN
10.0000 meq | INTRAVENOUS | Status: AC
  Administered 2024-01-01 (×2): 10 meq via INTRAVENOUS
  Filled 2024-01-01 (×2): qty 100

## 2024-01-01 MED ORDER — PANTOPRAZOLE SODIUM 40 MG IV SOLR
40.0000 mg | Freq: Two times a day (BID) | INTRAVENOUS | Status: DC
  Administered 2024-01-01: 40 mg via INTRAVENOUS
  Filled 2024-01-01: qty 10

## 2024-01-01 MED ORDER — POTASSIUM CHLORIDE 10 MEQ/100ML IV SOLN
10.0000 meq | INTRAVENOUS | Status: AC
  Administered 2024-01-01 (×6): 10 meq via INTRAVENOUS
  Filled 2024-01-01 (×6): qty 100

## 2024-01-01 MED ORDER — SODIUM CHLORIDE 0.9 % IV SOLN
3.0000 g | Freq: Once | INTRAVENOUS | Status: AC
  Administered 2024-01-01: 3 g via INTRAVENOUS
  Filled 2024-01-01: qty 30

## 2024-01-01 MED ORDER — ENSURE PLUS HIGH PROTEIN PO LIQD
237.0000 mL | Freq: Two times a day (BID) | ORAL | Status: DC
  Administered 2024-01-01 – 2024-01-04 (×4): 237 mL via ORAL

## 2024-01-01 MED ORDER — PANTOPRAZOLE SODIUM 40 MG IV SOLR: 40.0000 mg | INTRAVENOUS | Status: DC

## 2024-01-01 MED ORDER — DEXTROSE-SODIUM CHLORIDE 5-0.9 % IV SOLN: INTRAVENOUS | Status: AC

## 2024-01-01 MED ORDER — THIAMINE HCL 100 MG/ML IJ SOLN
500.0000 mg | Freq: Three times a day (TID) | INTRAVENOUS | Status: AC
  Administered 2024-01-01 – 2024-01-03 (×5): 500 mg via INTRAVENOUS
  Filled 2024-01-01: qty 5
  Filled 2024-01-01: qty 500
  Filled 2024-01-01 (×4): qty 5
  Filled 2024-01-01: qty 500
  Filled 2024-01-01: qty 5
  Filled 2024-01-01: qty 500

## 2024-01-01 MED ORDER — LACTATED RINGERS IV BOLUS
1000.0000 mL | Freq: Once | INTRAVENOUS | Status: AC
  Administered 2024-01-01: 1000 mL via INTRAVENOUS

## 2024-01-01 MED ORDER — ORAL CARE MOUTH RINSE: 15.0000 mL | OROMUCOSAL | Status: DC | PRN

## 2024-01-01 MED ORDER — NOREPINEPHRINE 4 MG/250ML-% IV SOLN
0.0000 ug/min | INTRAVENOUS | Status: DC
  Administered 2024-01-01: 5 ug/min via INTRAVENOUS
  Filled 2024-01-01: qty 250

## 2024-01-01 MED ORDER — ORAL CARE MOUTH RINSE
15.0000 mL | OROMUCOSAL | Status: DC
  Administered 2024-01-01: 15 mL via OROMUCOSAL

## 2024-01-01 MED ORDER — FOLIC ACID 1 MG PO TABS: 1.0000 mg | ORAL_TABLET | Freq: Every day | ORAL | Status: DC

## 2024-01-01 MED ORDER — HEPARIN SODIUM (PORCINE) 5000 UNIT/ML IJ SOLN: 5000.0000 [IU] | Freq: Three times a day (TID) | INTRAMUSCULAR | Status: DC

## 2024-01-01 MED ORDER — ORAL CARE MOUTH RINSE
15.0000 mL | OROMUCOSAL | Status: DC
  Administered 2024-01-01 – 2024-01-02 (×4): 15 mL via OROMUCOSAL

## 2024-01-01 MED ORDER — MAGNESIUM SULFATE 2 GM/50ML IV SOLN
2.0000 g | Freq: Once | INTRAVENOUS | Status: AC
  Administered 2024-01-01: 2 g via INTRAVENOUS
  Filled 2024-01-01: qty 50

## 2024-01-01 MED ORDER — THIAMINE MONONITRATE 100 MG PO TABS: 100.0000 mg | ORAL_TABLET | Freq: Every day | ORAL | Status: DC

## 2024-01-01 MED ORDER — SODIUM CHLORIDE 0.9% FLUSH: 10.0000 mL | INTRAVENOUS | Status: DC | PRN

## 2024-01-01 MED ORDER — LACTATED RINGERS IV BOLUS
500.0000 mL | Freq: Once | INTRAVENOUS | Status: AC
  Administered 2024-01-01: 500 mL via INTRAVENOUS

## 2024-01-01 MED ORDER — ALBUMIN HUMAN 5 % IV SOLN
25.0000 g | Freq: Once | INTRAVENOUS | Status: AC
  Administered 2024-01-01: 25 g via INTRAVENOUS
  Filled 2024-01-01: qty 500

## 2024-01-01 MED ORDER — SODIUM CHLORIDE 0.9 % IV SOLN
250.0000 mL | INTRAVENOUS | Status: AC
  Administered 2024-01-01: 250 mL via INTRAVENOUS

## 2024-01-01 MED ORDER — KCL-LACTATED RINGERS-D5W 20 MEQ/L IV SOLN
INTRAVENOUS | Status: DC
  Filled 2024-01-01 (×2): qty 1000

## 2024-01-01 MED ORDER — DEXTROSE 50 % IV SOLN: 25.0000 g | INTRAVENOUS | Status: AC

## 2024-01-01 MED ORDER — POTASSIUM CHLORIDE 20 MEQ PO PACK
40.0000 meq | PACK | Freq: Once | ORAL | Status: AC
  Administered 2024-01-01: 40 meq via ORAL

## 2024-01-01 MED ORDER — PANTOPRAZOLE SODIUM 40 MG PO TBEC
40.0000 mg | DELAYED_RELEASE_TABLET | Freq: Every day | ORAL | Status: DC
  Administered 2024-01-03 – 2024-01-04 (×2): 40 mg via ORAL
  Filled 2024-01-01 (×3): qty 1

## 2024-01-01 MED ORDER — DEXTROSE 50 % IV SOLN
INTRAVENOUS | Status: AC
  Administered 2024-01-01: 25 g via INTRAVENOUS
  Filled 2024-01-01: qty 50

## 2024-01-01 MED ORDER — SODIUM CHLORIDE 0.9% FLUSH
10.0000 mL | Freq: Two times a day (BID) | INTRAVENOUS | Status: DC
  Administered 2024-01-01: 10 mL
  Administered 2024-01-01: 20 mL
  Administered 2024-01-02 – 2024-01-04 (×6): 10 mL

## 2024-01-01 NOTE — Progress Notes (Addendum)
 NAME:  Christina Byrd, MRN:  [id number], DOB:  1965-09-10, LOS: 1 ADMISSION DATE:  12/31/2023, CONSULTATION DATE:  11/8 REFERRING MD: Charlyn, EDP CHIEF COMPLAINT: AMS   History of Present Illness:  58 year old female with past medical history of GERD, hypertension, alcohol use disorder, seizure disorder who presents to the ED with altered mental status. Per EMS report, called out by family as they found patient unresponsive. With EMS, found to have glucose of 29, hypotensive 70/40. Required BVM. On arrival to ED, hypothermic to 88 rectal. WBC 8, hgb 8.7, plt 65, NA 134, K 2.1, bicarb 14, BUN 32, sCr 2.62, calcium  4.3, AST 94, AG 23, ethanol 106, mag <0.5, lactic 5.5, UA +UTI,   Pertinent  Medical History  GERD, hypertension, alcohol use disorder, seizure disorder  Significant Hospital Events: Including procedures, antibiotic start and stop dates in addition to other pertinent events   11/8: ED for AMS, admit to ICU   Interim History / Subjective:  Mentation improved, conversational, sats well on nasal cannula, voiding Transfused 1 unit of PRBC  Objective   Blood pressure (!) 89/71, pulse 78, temperature 97.9 F (36.6 C), resp. rate 12, height 5' 6 (1.676 m), weight 46 kg, last menstrual period 08/23/2011, SpO2 98%.        Intake/Output Summary (Last 24 hours) at 01/01/2024 0737 Last data filed at 01/01/2024 9275 Gross per 24 hour  Intake 5740.26 ml  Output 865 ml  Net 4875.26 ml   Filed Weights   12/31/23 1923 01/01/24 0400  Weight: 42.9 kg 46 kg    Examination: Gen: Frail lying in bed acutely ill HEENT: NCAT CVS: S1-S2 normal, no murmur Abdomen: Soft nontender nondistended Extremities: No edema Neuro: AAO X3, no focal deficits GU: Foley   Resolved Hospital Problem list    Assessment & Plan:  57 year old female with history of HTN, alcohol use disorder, seizure disorder, presents seizures, alcohol use disorder, hypoglycemia, electrolyte abnormalities, and  sepsis.  Acute metabolic encephalopathy Multifactorial: Seizures, alcohol use-alcohol level 106, hypoglycemia, sepsis History of epilepsy, non compliant on home medication  Multifactorial in setting of chronic heavy alcohol use, hypoglycemia, c/f seizures at home and in ED and severe metabolic derangements. CT head negative. UA with UTI.  - check tsh, t4, ammonia, cortisol  - check UDS  - consulted neuro regarding seizure activity, loaded with Keppra  2.5g in ED  - spot EEG now  - treat etoh, uti and elyte derangements as below  - seizure precautions, delirium precautions  - frequent neuro checks  -On 11/9 mentation improving AAO X3  Sepsis  Hypothermia  Lactic acidosis Hypothermia to 88 on arrival; no leukocytosis. UA equivocal for UTI. CXR without pneumonia. Suspect lactic acidosis is from seizure activity and not hypoperfusion as she is not hypotensive on exam. No evidence of ischemia.  - send urine culture  - f/u blood cultures, MRSA PCR  - con't zosyn  for now, received vanc/zosyn  in ED  - trend fever, wbc, lactic curves  - con't bair hugger   Alcohol use disorder  Alcohol withdrawal-on phenobarb Per significant other at bedside, drinks liquor up to 1/5 day. States she is drinking all the time. Last drink 0300 11/8. Grossly tremulous on exam. 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 CK 453. UA equivocal UTI - f/u urine culture  - trend bmp, mag, phos - replete elytes - strict I&O - f/u urine studies - Avoid nephrotoxic agents, renally dose medications - ensure adequate renal perfusion   Severe metabolic derangements  Prolonged  Qtc  Hypokalemia Hypomagnesemia  Hypocalcemia  K 2.1, Ca 4.3 and mag <0.5 in ED  - received 4g mag and 20mEq K in ED - ionized Ca in process  - repeat BMP, mag now and add on phos  - ongoing repletion - give 20 IV K and 40 PO K, 2g calcium  gluconate - recheck labs  - check EKG for Qtc monitoring - 562 in ED  - tele monitoring  - avoid Qtc prolonging agents as able   GERD  - ppi daily   Hypothyroidism  - check TSH, T4  - start home synthroid  25mcg daily   History of hypertension  hyperlipidemia - BP stable now without need for PRN  - clinically monitor  - con't statin   Severe protein calorie malnutrition  - consult RD for recs    In summary: Patient is being managed for seizures, alcohol use disorder high risk for withdrawal, hypoglycemia, electrolyte abnormalities high risk for refeeding syndrome, and sepsis.  Patient is improving but is still requiring ICU monitoring and management.  Labs   CBC: Recent Labs  Lab 12/27/23 1003 12/31/23 1636 12/31/23 1640 01/01/24 0507  WBC 1.5*  --  8.0 11.6*  NEUTROABS 0.6*  --  6.3 10.7*  HGB 8.2* 9.9* 8.7* 6.0*  HCT 23.3* 29.0* 25.6* 16.9*  MCV 91.4  --  93.8 88.5  PLT 36*  --  65* 67*    Basic Metabolic Panel: Recent Labs  Lab 12/31/23 1636 12/31/23 1640 01/01/24 0008 01/01/24 0507  NA 135 134* 133* 131*  K <2.0* 2.1* 2.0* 3.0*  CL  --  97* 96* 97*  CO2  --  14* 14* 16*  GLUCOSE  --  79 37* 130*  BUN  --  32* 32* 28*  CREATININE  --  2.62* 2.44* 2.23*  CALCIUM   --  4.3* 5.2* 6.0*  MG  --  <0.5* 1.7  --   PHOS  --  4.0  --   --    GFR: Estimated Creatinine Clearance: 20 mL/min (A) (by C-G formula based on SCr of 2.23 mg/dL (H)). Recent Labs  Lab 12/27/23 1003 12/31/23 1637 12/31/23 1640 12/31/23 1854 01/01/24 0235 01/01/24 0507  WBC 1.5*  --  8.0  --   --  11.6*  LATICACIDVEN  --  5.5*  --  9.7* 1.9 1.9    Liver Function Tests: Recent Labs  Lab 12/31/23 1640  AST 94*  ALT 14  ALKPHOS 95   BILITOT 0.6  PROT 6.1*  ALBUMIN 2.3*   No results for input(s): LIPASE, AMYLASE in the last 168 hours. Recent Labs  Lab 01/01/24 0008  AMMONIA 34    ABG    Component Value Date/Time   HCO3 14.5 (L) 12/31/2023 1636   TCO2 15 (L) 12/31/2023 1636   ACIDBASEDEF 12.0 (H) 12/31/2023 1636   O2SAT 74 12/31/2023 1636     Coagulation Profile: Recent Labs  Lab 12/27/23 1003  INR 1.2    Cardiac Enzymes: Recent Labs  Lab 12/31/23 1913  CKTOTAL 453*    HbA1C: Hemoglobin A1C  Date/Time Value Ref Range Status  05/26/2023 04:40 PM  3.7 (A) 4.0 - 5.6 % Final    Comment:    Results reported to Dr Harrie 04-03-025 1652 T Fulcher, PBT   HbA1c, POC (controlled diabetic range)  Date/Time Value Ref Range Status  03/24/2021 11:05 AM 5.5 0.0 - 7.0 % Final  11/17/2020 04:13 PM 5.4 0.0 - 7.0 % Final   Hgb A1c MFr Bld  Date/Time Value Ref Range Status  01/01/2024 12:08 AM 5.2 4.8 - 5.6 % Final    Comment:    (NOTE) Diagnosis of Diabetes The following HbA1c ranges recommended by the American Diabetes Association (ADA) may be used as an aid in the diagnosis of diabetes mellitus.  Hemoglobin             Suggested A1C NGSP%              Diagnosis  <5.7                   Non Diabetic  5.7-6.4                Pre-Diabetic  >6.4                   Diabetic  <7.0                   Glycemic control for                       adults with diabetes.    07/11/2022 04:03 AM 5.0 4.8 - 5.6 % Final    Comment:    (NOTE) Pre diabetes:          5.7%-6.4%  Diabetes:              >6.4%  Glycemic control for   <7.0% adults with diabetes     CBG: Recent Labs  Lab 12/31/23 1951 01/01/24 0049 01/01/24 0115 01/01/24 0430 01/01/24 0717  GLUCAP 72 26* 157* 119* 155*    Review of Systems:   As above  Past Medical History:  She,  has a past medical history of GASTROESOPHAGEAL REFLUX, NO ESOPHAGITIS (04/21/2006), GERD (gastroesophageal reflux disease) (Dx 1995), Hot flash,  menopausal (06/22/2016), Hot flashes (01/30/2020), HTN (hypertension), Hypotension (10/21/2022), Intertrigo (06/22/2016), Nausea & vomiting (12/23/2022), Nausea and vomiting (12/24/2022), Pain and swelling of left ankle (12/02/2014), Seizures (HCC), Tendinitis of right hip flexor (07/23/2014), and Tendonitis, Achilles, right (04/01/2014).   Surgical History:   Past Surgical History:  Procedure Laterality Date   BIOPSY  06/03/2022   Procedure: BIOPSY;  Surgeon: Stacia Glendia BRAVO, MD;  Location: [facility] ENDOSCOPY;  Service: Gastroenterology;;   COLONOSCOPY N/A 06/21/2023   Procedure: COLONOSCOPY;  Surgeon: Albertus Gordy HERO, MD;  Location: [facility] ENDOSCOPY;  Service: Gastroenterology;  Laterality: N/A;   ESOPHAGOGASTRODUODENOSCOPY N/A 12/29/2022   Procedure: ESOPHAGOGASTRODUODENOSCOPY (EGD);  Surgeon: Nandigam, Kavitha V, MD;  Location: [facility] ENDOSCOPY;  Service: Gastroenterology;  Laterality: N/A;   ESOPHAGOGASTRODUODENOSCOPY N/A 06/21/2023   Procedure: EGD (ESOPHAGOGASTRODUODENOSCOPY);  Surgeon: Albertus Gordy HERO, MD;  Location: [facility] ENDOSCOPY;  Service: Gastroenterology;  Laterality: N/A;   ESOPHAGOGASTRODUODENOSCOPY (EGD) WITH PROPOFOL  N/A 06/03/2022   Procedure: ESOPHAGOGASTRODUODENOSCOPY (EGD) WITH PROPOFOL ;  Surgeon: Stacia Glendia BRAVO, MD;  Location[facility] ENDOSCOPY;  Service: Gastroenterology;  Laterality: N/A;   HOT HEMOSTASIS N/A 06/03/2022   Procedure: HOT HEMOSTASIS (ARGON PLASMA COAGULATION/BICAP);  Surgeon: Stacia Glendia BRAVO, MD;  Location: [facility] ENDOSCOPY;  Service: Gastroenterology;  Laterality: N/A;     Social History:   reports that she quit smoking about  11 years ago. Her smoking use included cigarettes. She has never used smokeless tobacco. She reports that she does not currently use alcohol after a past usage of about 49.0 standard drinks of alcohol per week. She reports that she does not use drugs.   Family History:  Her family history includes CAD in her mother.   Allergies Allergies  Allergen  Reactions   Levaquin  [Levofloxacin  In D5w] Rash   Losartan  Potassium Rash   Ace Inhibitors Cough   Cefepime  Rash    09/04/12 pm Patient started to break out with small macules after IV Vanco infusion, then macules increased in size after starting cefepime  infusion.   Chlorhexidine Itching and Rash   Codeine  Nausea Only   Vancomycin  Rash    09/04/12 pm Patient started to break out with small macules after IV Vanco infusion, then macules increased in size after starting cefepime  infusion. Tolerated in May 2025     Home Medications  Prior to Admission medications   Medication Sig Start Date End Date Taking? Authorizing Provider  acetaminophen  (TYLENOL ) 325 MG tablet Take 2 tablets (650 mg total) by mouth every 6 (six) hours as needed for mild pain (pain score 1-3) or fever (or Fever >/= 101). 07/27/23   Kandis Perkins, DO  allopurinol  (ZYLOPRIM ) 100 MG tablet Take 1 tablet (100 mg total) by mouth daily. 12/09/23   Harrie Bruckner, DO  atorvastatin  (LIPITOR) 20 MG tablet Take 1 tablet (20 mg total) by mouth daily. 10/20/23   Shawn Sick, MD  cetirizine  (ZYRTEC  ALLERGY) 10 MG tablet Take 1 tablet (10 mg total) by mouth daily. 12/08/23 12/07/24  Harrie Bruckner, DO  diclofenac  Sodium (VOLTAREN ) 1 % GEL Apply 4 g topically 4 (four) times daily. 10/27/23   Harrie Bruckner, DO  DULoxetine  (CYMBALTA ) 30 MG capsule Take 1 capsule (30 mg total) by mouth daily. 10/20/23 10/19/24  Shawn Sick, MD  feeding supplement (ENSURE ENLIVE / ENSURE PLUS) LIQD Take 237 mLs by mouth 3 (three) times daily between meals. Patient not taking: Reported on 10/21/2023 12/30/22   Fernand Prost, MD  folic acid  (FOLVITE ) 1 MG tablet Take 1 tablet (1 mg total) by mouth daily. 12/08/23   Harrie Bruckner, DO  levETIRAcetam  (KEPPRA ) 750 MG tablet Take 1 tablet (750 mg total) by mouth 2 (two) times daily. 12/08/23   Harrie Bruckner, DO  levothyroxine  (SYNTHROID ) 25 MCG tablet Take 1 tablet (25 mcg total) by mouth daily  before breakfast. 10/20/23   Shawn Sick, MD  magnesium  oxide (MAG-OX) 400 (240 Mg) MG tablet Take 1 tablet (400 mg total) by mouth daily. Patient not taking: Reported on 10/21/2023 07/27/23   Kandis Perkins, DO  melatonin 3 MG TABS tablet Take 1 tablet (3 mg total) by mouth at bedtime. 10/20/23   Norrine Sharper, MD  Multiple Vitamin (MULTIVITAMIN WITH MINERALS) TABS tablet Take 1 tablet by mouth daily. Patient not taking: Reported on 10/21/2023 05/03/23   Harrie Bruckner, DO  naltrexone  (DEPADE) 50 MG tablet Take 1 tablet (50 mg total) by mouth daily. 10/27/23   Harrie Bruckner, DO  ondansetron  (ZOFRAN -ODT) 4 MG disintegrating tablet Take 1 tablet (4 mg total) by mouth every 8 (eight) hours as needed for nausea or vomiting. 12/08/23   Harrie Bruckner, DO  pantoprazole  (PROTONIX ) 40 MG tablet Take 1 tablet (40 mg total) by mouth daily. 12/08/23   Harrie Bruckner, DO  predniSONE  (DELTASONE ) 20 MG tablet Take 20 mg by mouth. 12/21/23   [provider]  thiamine  (VITAMIN B1) 100 MG tablet  Take 1 tablet (100 mg total) by mouth daily. 12/08/23   Harrie Bruckner, DO  metFORMIN  (GLUCOPHAGE ) 500 MG tablet Take 0.5 tablets (250 mg total) by mouth daily with breakfast. 03/05/20 04/02/20  Danton Jon HERO, PA-C     Critical care time: 36 mins     Lenny Drought, MD  [facility] Prefer epic messenger for cross cover needs   Critical care time was exclusive of separately billable procedures and treating other patients.  Critical care was necessary to treat or prevent imminent or life-threatening deterioration.  Critical care was time spent personally by me on the following activities: development of treatment plan with patient and/or surrogate as well as nursing, discussions with consultants, evaluation of patient's response to treatment, examination of patient, obtaining history from patient or surrogate, ordering and performing treatments and interventions,  ordering and review of laboratory studies, ordering and review of radiographic studies, pulse oximetry, re-evaluation of patient's condition and participation in multidisciplinary rounds.

## 2024-01-01 NOTE — Progress Notes (Signed)
 eLink Physician-Brief Progress Note Patient Name: Christina Byrd DOB: 1965-08-03 MRN: [id number]   Date of Service  01/01/2024  HPI/Events of Note  Notified that patient refused oral potassium.  Pt has potassium 40meq ordered BID.  Last K at 3.7, crea 2.05.  eICU Interventions  Give IV potassium chloride  10meq x 2 doses.      Intervention Category Intermediate Interventions: Electrolyte abnormality - evaluation and management  Shanda Busman 01/01/2024, 9:22 PM  5:50 AM Notified of multiple liquid stools.   Plan> Check c diff.   6:44 AM Notified of hypocalcemia with calcium  at 5.8, K at 2.9. Crea 1.96.   Plan> Replete calcium  - 2g IV calcium  gluconate.  Replete K - 40meq packetx x 2.  Add IV potassium chloride  10meq x 2 doses.

## 2024-01-01 NOTE — Progress Notes (Signed)
 EEG complete - results pending

## 2024-01-01 NOTE — Progress Notes (Signed)
 Peripherally Inserted Central Catheter Placement  The IV Nurse has discussed with the patient and/or persons authorized to consent for the patient, the purpose of this procedure and the potential benefits and risks involved with this procedure.  The benefits include less needle sticks, lab draws from the catheter, and the patient may be discharged home with the catheter. Risks include, but not limited to, infection, bleeding, blood clot (thrombus formation), and puncture of an artery; nerve damage and irregular heartbeat and possibility to perform a PICC exchange if needed/ordered by physician.  Alternatives to this procedure were also discussed.  Bard Power PICC patient education guide, fact sheet on infection prevention and patient information card has been provided to patient /or left at bedside.  Medical Necessity obtained due to unable to contact family.  PICC Placement Documentation  PICC Double Lumen 01/01/24 Left Basilic 35 cm 0 cm (Active)  Indication for Insertion or Continuance of Line Vasoactive infusions 01/01/24 1135  Exposed Catheter (cm) 0 cm 01/01/24 1135  Site Assessment Clean, Dry, Intact 01/01/24 1135  Lumen #1 Status Flushed;Saline locked;Blood return noted 01/01/24 1135  Lumen #2 Status Flushed;Saline locked;Blood return noted 01/01/24 1135  Dressing Type Transparent;Securing device 01/01/24 1135  Dressing Status Antimicrobial disc/dressing in place;Clean, Dry, Intact 01/01/24 1135  Line Care Connections checked and tightened 01/01/24 1135  Line Adjustment (NICU/IV Team Only) No 01/01/24 1135  Dressing Intervention New dressing;Adhesive placed at insertion site (IV team only);Adhesive placed around edges of dressing (IV team/ICU RN only) 01/01/24 1135  Dressing Change Due 01/08/24 01/01/24 1135       Christina Byrd 01/01/2024, 11:36 AM

## 2024-01-01 NOTE — Progress Notes (Signed)
 Attempted to obtain telephone consent from family x2.  No answer.  Dr Hussin on unit gives verbal order to place PICC per medical necessity.  Second confirmation of order by Thelda Sheral Ruth RN.

## 2024-01-01 NOTE — Progress Notes (Addendum)
 Interval update: Repeat BMP, mag with K 2, bicarb 14, glucose 37, CA 5.2.   - giving potassium 10meq x6 runs IV, 40 meq PO ordered  - additional 2g mag  - would like to give some bicarb but need K to be a little higher before shifting  - dextrose  already given for hypoglycemia  - check ionized Ca stat  - will also check a PTH   Tinnie FORBES Furth, PA-C  [facility] 01/01/24 1:18 AM  Please see [email or web address] for pager details.  From 7A-7P if no response, please call [phone] After hours, please call ELink [phone]

## 2024-01-01 NOTE — Plan of Care (Signed)
  Problem: Coping: Goal: Ability to adjust to condition or change in health will improve Outcome: Progressing   Problem: Fluid Volume: Goal: Ability to maintain a balanced intake and output will improve Outcome: Progressing   Problem: Safety: Goal: Non-violent Restraint(s) Outcome: Completed/Met

## 2024-01-01 NOTE — Consult Note (Signed)
 WOC Nurse Consult Note: Reason for Consult: moisture damage to buttocks  Wound type: Moisture Associated Skin Damage around rectum and lower buttocks  ICD-10 CM Codes for Irritant Dermatitis  L24A2 - Due to fecal, urinary or dual incontinence  Pressure Injury POA: NA  Measurement: see nursing flowsheet; widespread erythema with scattered partial thickness skin loss around rectum and lower buttocks  Wound bed: as above  Drainage (amount, consistency, odor) see nursing flowsheet  Periwound:  Dressing procedure/placement/frequency: Cleanse perianal/buttocks region with soap and water , dry and apply Gerhardt's Butt Cream 3 times a day and prn soiling.  May sprinkle over Gerhardt's with floor stock antifungal powder (green and white label Microguard) if needed for additional drying effect.   POC discussed with bedside nurse. WOC team will not follow. Re-consult if further needs arise.   Thank you,    Powell Bar MSN, RN-BC, [employer]

## 2024-01-01 NOTE — Progress Notes (Signed)
 Echocardiogram 2D Echocardiogram has been performed.  Christina Byrd 01/01/2024, 4:50 PM

## 2024-01-01 NOTE — Progress Notes (Addendum)
 eLink Physician-Brief Progress Note Patient Name: Christina Byrd DOB: Aug 30, 1965 MRN: [id number]   Date of Service  01/01/2024  HPI/Events of Note  58 year old female with a history of chronic alcohol abuse and suspected alcohol-related seizures who was brought in to the emergency department after being found unresponsive.  She was noted to be hypothermic to 88 degrees rectal, hypotensive and hypoglycemic with a blood glucose of 29.  She was also noted to have multiple electrolyte imbalances and lactic acidosis.  Blood alcohol level was 106.  Received IV fluids and dextrose  in the emergency department which improved blood pressure and blood glucose.  Transferred to ICU.  Repeat blood glucose check in ICU was again 29.  Blood pressure is trending down.  eICU Interventions  Patient's chart reviewed.  Pertinent labs and imaging studies reviewed.  Video assessment of patient done.  She does admit to passing dark tarry stools.  She states that she had a EGD and colonoscopy in the recent past which were all negative.  She is hungry and wants to eat.  Denies ever having had alcohol withdrawal.  Impression: Hypotension with hypoglycemia and electrolyte imbalances as well as lactic acidosis, all most likely related to chronic alcohol use.  Will need to rule out thyroid disease as well as infection. Anemia with patient passing black tarry stools.  Most likely from chronic blood loss related to alcoholic gastritis or peptic ulcer disease. Acute kidney injury with metabolic acidosis and electrolyte derangements Severe protein calorie malnutrition  Getting electrolyte replacement.  Monitor for refeeding syndrome in this patient with obvious protein calorie malnutrition. Start twice daily PPI.  Will need GI eval for upper GI bleed workup. IV fluids for blood pressure. Monitor blood counts after resuscitation as I suspect that hemoglobin is much lower than what she presented with. CIWA protocol with  phenobarbital  taper to prevent alcohol withdrawal. Dextrose  to prevent hypoglycemia.  Hypoglycemia is most likely related to low glycogen stores from alcoholic liver disease Keep hydrated and avoid nephrotoxins. Lactic acid is most likely from dehydration as well as alcohol use. Thyroid function tests pending Ongoing supportive care Plan discussed with bedside RN.      Intervention Category Evaluation Type: New Patient Evaluation  Christina Byrd 01/01/2024, 1:40 AM ------------------------- Addendum: Patient complained of pain where her interosseous needle was placed.  She has no pain medication ordered for her. Oxycodone  5 mg every 6 hours x 8 doses ordered as needed for pain.  Christina Byrd 01/01/2024, 3:46 AM ----------------- Addendum: Hemoglobin from this morning is 6.0.  Patient just finished a liter of IV fluids and blood pressure is low again. Also has electrolyte imbalances on a.m. labs.  Will transfuse 1 unit of packed red cells.  Will also give 25 g of 5% albumin to help with intravascular volume and blood pressure. Replacement for electrolytes ordered. Discussed with RN.  Christina Byrd 01/01/2024, 5:32 AM

## 2024-01-02 DIAGNOSIS — R569 Unspecified convulsions: Secondary | ICD-10-CM | POA: Diagnosis not present

## 2024-01-02 DIAGNOSIS — Z91148 Patient's other noncompliance with medication regimen for other reason: Secondary | ICD-10-CM

## 2024-01-02 DIAGNOSIS — T4276XA Underdosing of unspecified antiepileptic and sedative-hypnotic drugs, initial encounter: Secondary | ICD-10-CM

## 2024-01-02 DIAGNOSIS — E878 Other disorders of electrolyte and fluid balance, not elsewhere classified: Secondary | ICD-10-CM

## 2024-01-02 LAB — TYPE AND SCREEN
ABO/RH(D): B NEG
Antibody Screen: NEGATIVE
Unit division: 0
Unit division: 0

## 2024-01-02 LAB — BASIC METABOLIC PANEL WITH GFR
Anion gap: 14 (ref 5–15)
Anion gap: 14 (ref 5–15)
Anion gap: 18 — ABNORMAL HIGH (ref 5–15)
BUN: 19 mg/dL (ref 6–20)
BUN: 20 mg/dL (ref 6–20)
BUN: 20 mg/dL (ref 6–20)
CO2: 17 mmol/L — ABNORMAL LOW (ref 22–32)
CO2: 18 mmol/L — ABNORMAL LOW (ref 22–32)
CO2: 20 mmol/L — ABNORMAL LOW (ref 22–32)
Calcium: 5.8 mg/dL — CL (ref 8.9–10.3)
Calcium: 7.9 mg/dL — ABNORMAL LOW (ref 8.9–10.3)
Calcium: 7.4 mg/dL — ABNORMAL LOW (ref 8.9–10.3)
Chloride: 108 mmol/L (ref 98–111)
Chloride: 104 mmol/L (ref 98–111)
Chloride: 104 mmol/L (ref 98–111)
Creatinine, Ser: 1.96 mg/dL — ABNORMAL HIGH (ref 0.44–1.00)
Creatinine, Ser: 2.23 mg/dL — ABNORMAL HIGH (ref 0.44–1.00)
Creatinine, Ser: 2.11 mg/dL — ABNORMAL HIGH (ref 0.44–1.00)
GFR, Estimated: 29 mL/min — ABNORMAL LOW (ref >60)
GFR, Estimated: 25 mL/min — ABNORMAL LOW (ref >60)
GFR, Estimated: 27 mL/min — ABNORMAL LOW (ref >60)
Glucose, Bld: 112 mg/dL — ABNORMAL HIGH (ref 70–99)
Glucose, Bld: 139 mg/dL — ABNORMAL HIGH (ref 70–99)
Glucose, Bld: 176 mg/dL — ABNORMAL HIGH (ref 70–99)
Potassium: 2.9 mmol/L — ABNORMAL LOW (ref 3.5–5.1)
Potassium: 3.8 mmol/L (ref 3.5–5.1)
Potassium: 3.8 mmol/L (ref 3.5–5.1)
Sodium: 139 mmol/L (ref 135–145)
Sodium: 136 mmol/L (ref 135–145)
Sodium: 142 mmol/L (ref 135–145)

## 2024-01-02 LAB — CBC
HCT: 21.4 % — ABNORMAL LOW (ref 36.0–46.0)
Hemoglobin: 7.9 g/dL — ABNORMAL LOW (ref 12.0–15.0)
MCH: 32.1 pg (ref 26.0–34.0)
MCHC: 36.9 g/dL — ABNORMAL HIGH (ref 30.0–36.0)
MCV: 87 fL (ref 80.0–100.0)
Platelets: 81 K/uL — ABNORMAL LOW (ref 150–400)
RBC: 2.46 MIL/uL — ABNORMAL LOW (ref 3.87–5.11)
RDW: 15 % (ref 11.5–15.5)
WBC: 9.2 K/uL (ref 4.0–10.5)
nRBC: 0 % (ref 0.0–0.2)

## 2024-01-02 LAB — URINE CULTURE
Culture: 50000 — AB
Culture: NO GROWTH

## 2024-01-02 LAB — BPAM RBC
Blood Product Expiration Date: 202511152359
Blood Product Expiration Date: 202512132359
ISSUE DATE / TIME: 202511090739
ISSUE DATE / TIME: 202511091348
Unit Type and Rh: 1700
Unit Type and Rh: 1700

## 2024-01-02 LAB — PHOSPHORUS
Phosphorus: 3.5 mg/dL (ref 2.5–4.6)
Phosphorus: 3.2 mg/dL (ref 2.5–4.6)

## 2024-01-02 LAB — MAGNESIUM
Magnesium: 1.8 mg/dL (ref 1.7–2.4)
Magnesium: 2.3 mg/dL (ref 1.7–2.4)

## 2024-01-02 LAB — GLUCOSE, CAPILLARY
Glucose-Capillary: 187 mg/dL — ABNORMAL HIGH (ref 70–99)
Glucose-Capillary: 105 mg/dL — ABNORMAL HIGH (ref 70–99)
Glucose-Capillary: 106 mg/dL — ABNORMAL HIGH (ref 70–99)
Glucose-Capillary: 150 mg/dL — ABNORMAL HIGH (ref 70–99)
Glucose-Capillary: 130 mg/dL — ABNORMAL HIGH (ref 70–99)
Glucose-Capillary: 188 mg/dL — ABNORMAL HIGH (ref 70–99)

## 2024-01-02 LAB — CLOSTRIDIUM DIFFICILE BY PCR, REFLEXED
Hypervirulent Strain: NEGATIVE
Toxigenic C. Difficile by PCR: NEGATIVE

## 2024-01-02 LAB — C DIFFICILE QUICK SCREEN W PCR REFLEX
C Diff antigen: POSITIVE — AB
C Diff toxin: NEGATIVE

## 2024-01-02 LAB — PARATHYROID HORMONE, INTACT (NO CA): PTH: 253 pg/mL — ABNORMAL HIGH (ref 15–65)

## 2024-01-02 MED ORDER — SODIUM CHLORIDE 0.9 % IV SOLN
4.0000 g | Freq: Once | INTRAVENOUS | Status: AC
  Administered 2024-01-02: 4 g via INTRAVENOUS
  Filled 2024-01-02: qty 40

## 2024-01-02 MED ORDER — MAGNESIUM SULFATE 2 GM/50ML IV SOLN
2.0000 g | Freq: Once | INTRAVENOUS | Status: AC
  Administered 2024-01-02: 2 g via INTRAVENOUS
  Filled 2024-01-02: qty 50

## 2024-01-02 MED ORDER — SODIUM BICARBONATE 8.4 % IV SOLN
INTRAVENOUS | Status: AC
  Filled 2024-01-02: qty 1000

## 2024-01-02 MED ORDER — LEVETIRACETAM 500 MG PO TABS
500.0000 mg | ORAL_TABLET | Freq: Two times a day (BID) | ORAL | Status: DC
  Administered 2024-01-03 – 2024-01-04 (×3): 500 mg via ORAL
  Filled 2024-01-02 (×4): qty 1

## 2024-01-02 MED ORDER — POTASSIUM CHLORIDE 10 MEQ/100ML IV SOLN
10.0000 meq | INTRAVENOUS | Status: AC
Start: 2024-01-02 — End: 2024-01-02
  Administered 2024-01-02 (×2): 10 meq via INTRAVENOUS
  Filled 2024-01-02 (×2): qty 100

## 2024-01-02 MED ORDER — CALCIUM GLUCONATE-NACL 2-0.675 GM/100ML-% IV SOLN
2.0000 g | Freq: Once | INTRAVENOUS | Status: AC
  Administered 2024-01-02: 2000 mg via INTRAVENOUS
  Filled 2024-01-02: qty 100

## 2024-01-02 MED ORDER — POTASSIUM CHLORIDE 20 MEQ PO PACK: 40.0000 meq | PACK | Freq: Once | ORAL | Status: DC

## 2024-01-02 MED ORDER — POTASSIUM CHLORIDE 10 MEQ/50ML IV SOLN
10.0000 meq | INTRAVENOUS | Status: AC
  Administered 2024-01-02 (×3): 10 meq via INTRAVENOUS
  Filled 2024-01-02 (×3): qty 50

## 2024-01-02 MED ORDER — LEVETIRACETAM (KEPPRA) 500 MG/5 ML ADULT IV PUSH
500.0000 mg | Freq: Two times a day (BID) | INTRAVENOUS | Status: DC
  Administered 2024-01-02 – 2024-01-03 (×2): 500 mg via INTRAVENOUS
  Filled 2024-01-02 (×3): qty 5

## 2024-01-02 NOTE — TOC CAGE-AID Note (Signed)
 Transition of Care [facility]) - CAGE-AID Screening   Patient Details  Name: Christina Byrd MRN: [id number] Date of Birth: 1965/07/16  [facility] CM/SW Contact:    Lendia Dais, LCSWA Phone Number: 01/02/2024, 2:15 PM   Clinical Narrative: Pt is oriented to self only and unable to participate in CAGE AID.  No resources provided.    CAGE-AID Screening: Substance Abuse Screening unable to be completed due to: : Patient unable to participate  Have You Ever Felt You Ought to Cut Down on Your Drinking or Drug Use?:  (Pt disoriented x3) Have People Annoyed You By Critizing Your Drinking Or Drug Use?:  (Pt disoriented x3) Have You Felt Bad Or Guilty About Your Drinking Or Drug Use?:  (Pt disoriented x3) Have You Ever Had a Drink or Used Drugs First Thing In The Morning to Steady Your Nerves or to Get Rid of a Hangover?:  (Pt disoriented x3)    Substance Abuse Education Offered: No  Substance abuse interventions:  (Pt disoriented x3)

## 2024-01-02 NOTE — Plan of Care (Signed)
 Patient still confused.  Oriented to self only and reorients. Vitals have remained stable. Still replacing electrolytes.

## 2024-01-02 NOTE — Procedures (Signed)
 Patient Name: Christina Byrd  MRN: [id number]  Epilepsy Attending: Arlin MALVA Krebs  Referring Physician/Provider: Adolph Tinnie BRAVO, PA-C  Date: 01/01/2024 Duration: 22.08 mins  Patient history: 58 y.o. female with breakthrough seizure in the setting of noncompliance with AEDs as well as multiple electrolyte abnormalities.  EEG to assess for seizure.  Level of alertness: Awake  AEDs during EEG study: Keppra , phenobarbital   Technical aspects: This EEG study was done with scalp electrodes positioned according to the 10-20 International system of electrode placement. Electrical activity was reviewed with band pass filter of 1-70Hz , sensitivity of 7 uV/mm, display speed of 61mm/sec with a 60Hz  notched filter applied as appropriate. EEG data were recorded continuously and digitally stored.  Video monitoring was available and reviewed as appropriate.  Description: EEG showed continuous generalized 3-5hz  theta- delta slowing. Hyperventilation and photic stimulation were not performed.   Of note, study was technically difficult due to significant movement and myogenic artifact.  ABNORMALITY - Continuous slow, generalized  IMPRESSION: This technically difficult study is suggestive of generalized cerebral dysfunction (encephalopathy). No seizures or epileptiform discharges were seen throughout the recording.  Mekel Haverstock O Timiya Howells

## 2024-01-02 NOTE — Progress Notes (Addendum)
 NAME:  Christina Byrd, MRN:  [id number], DOB:  Oct 22, 1965, LOS: 2 ADMISSION DATE:  12/31/2023, CONSULTATION DATE:  11/8 REFERRING MD: Charlyn, EDP CHIEF COMPLAINT: AMS   History of Present Illness:  58 year old female with past medical history of GERD, hypertension, alcohol use disorder, seizure disorder who presents to the ED with altered mental status. Per EMS report, called out by family as they found patient unresponsive. With EMS, found to have glucose of 29, hypotensive 70/40. Required BVM. On arrival to ED, hypothermic to 88 rectal. WBC 8, hgb 8.7, plt 65, NA 134, K 2.1, bicarb 14, BUN 32, sCr 2.62, calcium  4.3, AST 94, AG 23, ethanol 106, mag <0.5, lactic 5.5, UA +UTI,   Pertinent  Medical History  GERD, hypertension, alcohol use disorder, seizure disorder  Significant Hospital Events: Including procedures, antibiotic start and stop dates in addition to other pertinent events   11/8: ED for AMS, admit to ICU   Interim History / Subjective:  No acute event overnight, still repeating electrolytes and closely monitoring for refeeding syndrome Improving mentation, started on nasal cannula voiding, Hb stable   Objective   Blood pressure 108/64, pulse 83, temperature 98.8 F (37.1 C), temperature source Bladder, resp. rate 14, height 5' 6 (1.676 m), weight 46 kg, last menstrual period 08/23/2011, SpO2 97%.        Intake/Output Summary (Last 24 hours) at 01/02/2024 0743 Last data filed at 01/02/2024 0738 Gross per 24 hour  Intake 4359.4 ml  Output 755 ml  Net 3604.4 ml   Filed Weights   12/31/23 1923 01/01/24 0400 01/02/24 0337  Weight: 42.9 kg 46 kg 46 kg    Examination: GEN: Frail lying in bed acutely ill HEENT: NCAT CVS: S1-S2 normal, no murmur Abdomen soft nontender nondistended Extremity: No edema Neuro AO X2, no focal deficit, weak, but intact motor and sensation. GU Foley    Resolved Hospital Problem list    Assessment & Plan:  58 year old female with  history of HTN, alcohol use disorder, seizure disorder, presents seizures, alcohol use disorder, hypoglycemia, electrolyte abnormalities, and sepsis.  Acute metabolic encephalopathy Multifactorial: Seizures, alcohol use-alcohol level 106, hypoglycemia, sepsis History of epilepsy, non compliant on home medication  Multifactorial in setting of chronic heavy alcohol use, hypoglycemia, c/f seizures at home and in ED and severe metabolic derangements. CT head negative. UA with UTI.  - check tsh, t4, ammonia, cortisol  - check UDS  - consulted neuro regarding seizure activity, loaded with Keppra  2.5g in ED  - spot EEG now  - treat etoh, uti and elyte derangements as below  - seizure precautions, delirium precautions  - frequent neuro checks  -On 11/9 mentation improving AAO X3  Sepsis  Hypothermia  Lactic acidosis Distributive shock-likely septic shock-resolved Hypothermia to 88 on arrival; no leukocytosis. UA equivocal for UTI. CXR without pneumonia. Suspect lactic acidosis is from seizure activity and not hypoperfusion as she is not hypotensive on exam. No evidence of ischemia.  - send urine culture  - f/u blood cultures, MRSA PCR  - con't zosyn  for now, received vanc/zosyn  in ED  - trend fever, wbc, lactic curves  - con't bair hugger   Alcohol use disorder  Alcohol withdrawal-on phenobarb Per significant other at bedside, drinks liquor up to 1/5 day. States she is drinking all the time. Last drink 0300 11/8. Grossly tremulous on exam. 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 BUN 32, sCr 2.62, baseline <1. CK  453. UA equivocal UTI - f/u urine culture  - trend bmp, mag, phos - replete elytes - strict I&O - f/u urine studies - Avoid nephrotoxic agents, renally dose medications - ensure adequate renal perfusion   Severe metabolic derangements  Prolonged Qtc  Hypokalemia Hypomagnesemia  Hypocalcemia  Hyponatremia K 2.1, Ca 4.3 and mag <0.5 in ED  - received 4g mag and 20mEq K in ED - ionized Ca in process  - repeat BMP, mag now and add on phos  - ongoing repletion - give 20 IV K and 40 PO K, 2g calcium  gluconate - recheck labs  - check EKG for Qtc monitoring - 562 in ED  - tele monitoring  - avoid Qtc prolonging agents as able   GERD  - ppi daily   Hypothyroidism  - check TSH, T4  - start home synthroid  25mcg daily   History of hypertension  hyperlipidemia - BP stable now without need for PRN  - clinically monitor  - con't statin   Severe protein calorie malnutrition  - consult RD for recs    In summary: Patient is being managed for seizures, alcohol use high risk for withdrawal, electrolyte abnormalities with a high risk for refeeding syndrome and sepsis.  We are encouraging feeding as closely monitor, improving urinary output on bicarb gtt.   Labs   CBC: Recent Labs  Lab 12/27/23 1003 12/31/23 1636 12/31/23 1640 01/01/24 0507 01/01/24 1215 01/01/24 1744  WBC 1.5*  --  8.0 11.6*  --   --   NEUTROABS 0.6*  --  6.3 10.7*  --   --   HGB 8.2* 9.9* 8.7* 6.0* 5.7* 10.1*  HCT 23.3* 29.0* 25.6* 16.9* 16.6* 29.6*  MCV 91.4  --  93.8 88.5  --   --   PLT 36*  --  65* 67*  --   --     Basic Metabolic Panel: Recent Labs  Lab 12/31/23 1640 01/01/24 0008 01/01/24 0507 01/01/24 1215 01/01/24 1634 01/01/24 1744 01/02/24 0525  NA 134* 133* 131* 126* 132* 133* 139  K 2.1* 2.0* 3.0* 6.8* 3.7 3.7 2.9*  CL 97* 96* 97* 95* 102 103 108  CO2 14* 14* 16* 17* 16* 14* 17*  GLUCOSE 79 37* 130* 478* 205* 192* 112*  BUN 32* 32* 28* 24* 24* 23* 19  CREATININE 2.62* 2.44* 2.23*  2.04* 2.01* 2.05* 1.96*  CALCIUM  4.3* 5.2* 6.0* 5.5* 6.1* 6.4* 5.8*  MG <0.5* 1.7  --  2.4 2.4 2.5* 1.8  PHOS 4.0  --   --  11.8* 5.1* 5.2* 3.5   GFR: Estimated Creatinine Clearance: 22.7 mL/min (A) (by C-G formula based on SCr of 1.96 mg/dL (H)). Recent Labs  Lab 12/27/23 1003 12/31/23 1637 12/31/23 1640 12/31/23 1854 01/01/24 0235 01/01/24 0507  WBC 1.5*  --  8.0  --   --  11.6*  LATICACIDVEN  --  5.5*  --  9.7* 1.9 1.9    Liver Function Tests: Recent Labs  Lab 12/31/23 1640  AST 94*  ALT 14  ALKPHOS 95  BILITOT 0.6  PROT 6.1*  ALBUMIN 2.3*   No results for input(s): LIPASE, AMYLASE in the last 168 hours.  Recent Labs  Lab 01/01/24 0008  AMMONIA 34    ABG    Component Value Date/Time   HCO3 14.5 (L) 12/31/2023 1636   TCO2 15 (L) 12/31/2023 1636   ACIDBASEDEF 12.0 (H) 12/31/2023 1636   O2SAT 74 12/31/2023 1636     Coagulation Profile: Recent Labs  Lab 12/27/23 1003  INR 1.2    Cardiac Enzymes: Recent Labs  Lab 12/31/23 1913  CKTOTAL 453*    HbA1C: Hemoglobin A1C  Date/Time Value Ref Range Status  05/26/2023 04:40 PM 3.7 (A) 4.0 - 5.6 % Final    Comment:    Results reported to Dr Harrie 04-03-025 1652 T Fulcher, PBT   HbA1c, POC (controlled diabetic range)  Date/Time Value Ref Range Status  03/24/2021 11:05 AM 5.5 0.0 - 7.0 % Final  11/17/2020 04:13 PM 5.4 0.0 - 7.0 % Final   Hgb A1c MFr Bld  Date/Time Value Ref Range Status  01/01/2024 12:08 AM 5.2 4.8 - 5.6 % Final    Comment:    (NOTE) Diagnosis of Diabetes The following HbA1c ranges recommended by the American Diabetes Association (ADA) may be used as an aid in the diagnosis of diabetes mellitus.  Hemoglobin             Suggested A1C NGSP%              Diagnosis  <5.7                   Non Diabetic  5.7-6.4                Pre-Diabetic  >6.4                   Diabetic  <7.0                   Glycemic control for                       adults with diabetes.     07/11/2022 04:03 AM 5.0 4.8 - 5.6 % Final    Comment:    (NOTE) Pre diabetes:          5.7%-6.4%  Diabetes:              >6.4%  Glycemic control for   <7.0% adults with diabetes     CBG: Recent Labs  Lab 01/01/24 1534 01/01/24 1935 01/01/24 2322 01/02/24 0312 01/02/24 0740  GLUCAP 233* 184* 244* 187* 105*    Review of Systems:   As above  Past Medical History:  She,  has a past medical history of GASTROESOPHAGEAL REFLUX, NO ESOPHAGITIS (04/21/2006), GERD (gastroesophageal reflux disease) (Dx 1995), Hot flash, menopausal (06/22/2016), Hot flashes (01/30/2020), HTN (hypertension), Hypotension (10/21/2022), Intertrigo (06/22/2016), Nausea & vomiting (12/23/2022), Nausea and vomiting (12/24/2022), Pain and swelling of left ankle (12/02/2014), Seizures (HCC), Tendinitis of right hip flexor (07/23/2014), and Tendonitis, Achilles, right (04/01/2014).   Surgical History:   Past Surgical History:  Procedure Laterality Date   BIOPSY  06/03/2022   Procedure: BIOPSY;  Surgeon: Stacia Glendia BRAVO, MD;  Location: [facility] ENDOSCOPY;  Service: Gastroenterology;;   COLONOSCOPY N/A 06/21/2023   Procedure: COLONOSCOPY;  Surgeon: Albertus Gordy HERO, MD;  Location: [facility] ENDOSCOPY;  Service: Gastroenterology;  Laterality: N/A;   ESOPHAGOGASTRODUODENOSCOPY N/A 12/29/2022   Procedure: ESOPHAGOGASTRODUODENOSCOPY (EGD);  Surgeon: Nandigam, Kavitha V, MD;  Location: [facility] ENDOSCOPY;  Service: Gastroenterology;  Laterality: N/A;   ESOPHAGOGASTRODUODENOSCOPY N/A 06/21/2023   Procedure: EGD (ESOPHAGOGASTRODUODENOSCOPY);  Surgeon: Albertus Gordy HERO, MD;  Location: [facility] ENDOSCOPY;  Service: Gastroenterology;  Laterality: N/A;   ESOPHAGOGASTRODUODENOSCOPY (EGD) WITH PROPOFOL  N/A 06/03/2022   Procedure: ESOPHAGOGASTRODUODENOSCOPY (EGD) WITH PROPOFOL ;  Surgeon: Stacia Glendia BRAVO, MD;  Location: [facility] ENDOSCOPY;  Service: Gastroenterology;  Laterality: N/A;   HOT HEMOSTASIS N/A 06/03/2022   Procedure: HOT HEMOSTASIS (ARGON PLASMA  COAGULATION/BICAP);  Surgeon: Stacia Glendia BRAVO, MD;  Location: [facility] ENDOSCOPY;  Service: Gastroenterology;  Laterality: N/A;     Social History:   reports that she quit smoking about 11 years ago. Her smoking use included cigarettes. She has never used smokeless tobacco. She reports that she does not currently use alcohol after a past usage of about 49.0 standard drinks of alcohol per week. She reports that she does not use drugs.   Family History:  Her family history includes CAD in her mother.   Allergies Allergies  Allergen Reactions   Levaquin  [Levofloxacin  In D5w] Rash   Losartan  Potassium Rash   Ace Inhibitors Cough   Cefepime  Rash    09/04/12 pm Patient started to break out with small macules after IV Vanco infusion, then macules increased in size after starting cefepime  infusion.   Chlorhexidine Itching and Rash   Codeine  Nausea Only   Vancomycin  Rash    09/04/12 pm Patient started to break out with small macules after IV Vanco infusion, then macules increased in size after starting cefepime  infusion. Tolerated in May 2025     Home Medications  Prior to Admission medications   Medication Sig Start Date End Date Taking? Authorizing Provider  acetaminophen  (TYLENOL ) 325 MG tablet Take 2 tablets (650 mg total) by mouth every 6 (six) hours as needed for mild pain (pain score 1-3) or fever (or Fever >/= 101). 07/27/23   Kandis Perkins, DO  allopurinol  (ZYLOPRIM ) 100 MG tablet Take 1 tablet (100 mg total) by mouth daily. 12/09/23   Harrie Bruckner, DO  atorvastatin  (LIPITOR) 20 MG tablet Take 1 tablet (20 mg total) by mouth daily. 10/20/23   Shawn Sick, MD  cetirizine  (ZYRTEC  ALLERGY) 10 MG tablet Take 1 tablet (10 mg total) by mouth daily. 12/08/23 12/07/24  Harrie Bruckner, DO  diclofenac  Sodium (VOLTAREN ) 1 % GEL Apply 4 g topically 4 (four) times daily. 10/27/23   Harrie Bruckner, DO  DULoxetine  (CYMBALTA ) 30 MG capsule Take 1 capsule (30 mg total) by mouth daily. 10/20/23  10/19/24  Shawn Sick, MD  feeding supplement (ENSURE ENLIVE / ENSURE PLUS) LIQD Take 237 mLs by mouth 3 (three) times daily between meals. Patient not taking: Reported on 10/21/2023 12/30/22   Fernand Prost, MD  folic acid  (FOLVITE ) 1 MG tablet Take 1 tablet (1 mg total) by mouth daily. 12/08/23   Harrie Bruckner, DO  levETIRAcetam  (KEPPRA ) 750 MG tablet Take 1 tablet (750 mg total) by mouth 2 (two) times daily. 12/08/23   Harrie Bruckner, DO  levothyroxine  (SYNTHROID ) 25 MCG tablet Take 1 tablet (25 mcg total) by mouth daily before breakfast. 10/20/23   Shawn Sick, MD  magnesium  oxide (MAG-OX) 400 (240 Mg) MG tablet Take 1 tablet (400 mg total) by mouth daily. Patient not taking: Reported on 10/21/2023 07/27/23   Kandis Perkins, DO  melatonin 3 MG TABS tablet Take 1 tablet (3 mg total) by mouth at bedtime. 10/20/23   Norrine Sharper, MD  Multiple Vitamin (MULTIVITAMIN WITH MINERALS) TABS tablet Take 1 tablet by mouth daily. Patient not taking: Reported on 10/21/2023 05/03/23   Harrie Bruckner, DO  naltrexone  (DEPADE) 50 MG tablet Take  1 tablet (50 mg total) by mouth daily. 10/27/23   Harrie Bruckner, DO  ondansetron  (ZOFRAN -ODT) 4 MG disintegrating tablet Take 1 tablet (4 mg total) by mouth every 8 (eight) hours as needed for nausea or vomiting. 12/08/23   Harrie Bruckner, DO  pantoprazole  (PROTONIX ) 40 MG tablet Take 1 tablet (40 mg total) by mouth daily. 12/08/23   Harrie Bruckner, DO  predniSONE  (DELTASONE ) 20 MG tablet Take 20 mg by mouth. 12/21/23   [provider]  thiamine  (VITAMIN B1) 100 MG tablet Take 1 tablet (100 mg total) by mouth daily. 12/08/23   Harrie Bruckner, DO  metFORMIN  (GLUCOPHAGE ) 500 MG tablet Take 0.5 tablets (250 mg total) by mouth daily with breakfast. 03/05/20 04/02/20  Danton Jon HERO, PA-C     Critical care time: 35 mins     Lenny Drought, MD  [facility] Prefer epic messenger for cross cover  needs   Critical care time was exclusive of separately billable procedures and treating other patients.  Critical care was necessary to treat or prevent imminent or life-threatening deterioration.  Critical care was time spent personally by me on the following activities: development of treatment plan with patient and/or surrogate as well as nursing, discussions with consultants, evaluation of patient's response to treatment, examination of patient, obtaining history from patient or surrogate, ordering and performing treatments and interventions, ordering and review of laboratory studies, ordering and review of radiographic studies, pulse oximetry, re-evaluation of patient's condition and participation in multidisciplinary rounds.

## 2024-01-02 NOTE — Progress Notes (Signed)
 Patient refused Q6 hr BMP. Once again explained to patient about the importance of monitoring her lab work and giving her the necessary replacements that she needs. Will try again later.

## 2024-01-02 NOTE — Progress Notes (Addendum)
 NEUROLOGY CONSULT FOLLOW UP NOTE   Date of service: January 02, 2024 Patient Name: TAKESHIA WENK MRN:  [id number] DOB:  April 20, 1965  Interval Hx/subjective   Doing better, no recent seizures, she does endorse that she missed medications.  Vitals   Vitals:   01/02/24 0600 01/02/24 0630 01/02/24 0700 01/02/24 0738  BP: 101/65 112/61 108/64   Pulse: 89 95 95 83  Resp: 18 14 (!) 21 14  Temp: 99.1 F (37.3 C) 98.1 F (36.7 C) 99 F (37.2 C) 98.8 F (37.1 C)  TempSrc:    Bladder  SpO2: 97% 99% 97% 97%  Weight:      Height:         Body mass index is 16.37 kg/m.  Physical Exam   Constitutional: Appears well-developed and well-nourished.  Neurologic Examination    MS: Awakens easily, she knows the year, initially gives the month is June but when I ask her what holiday just past, she corrects her self to November, knows that she is at [facility]. CN: EOMI(she has a mild exophoria that corrects with increasing level of arousal), face symmetric Motor: She is able to lift all extremities against gravity, though she has poor effort throughout Sensory: Endorses symmetric sensation  Medications  Current Facility-Administered Medications:    0.9 %  sodium chloride  infusion, 250 mL, Intravenous, Continuous, Hussein, Lenny, MD, Stopped at 01/01/24 1027   atorvastatin  (LIPITOR) tablet 20 mg, 20 mg, Oral, Daily, Autry, Lauren E, PA-C, 20 mg at 01/01/24 9083   dextrose  5 %-0.9 % sodium chloride  infusion, , Intravenous, Continuous, Hussein, Lenny, MD, Stopped at 01/01/24 1537   docusate sodium  (COLACE) capsule 100 mg, 100 mg, Oral, BID PRN, Adolph, Lauren E, PA-C   DULoxetine  (CYMBALTA ) DR capsule 30 mg, 30 mg, Oral, Daily, Autry, Lauren E, PA-C, 30 mg at 01/01/24 0916   feeding supplement (ENSURE PLUS HIGH PROTEIN) liquid 237 mL, 237 mL, Oral, BID BM, Hussein, Abdullahi, MD, 237 mL at 01/01/24 9077   folic acid  (FOLVITE ) tablet 1 mg, 1 mg, Oral, Daily, Autry, Lauren E, PA-C,  1 mg at 01/01/24 9083   Gerhardt's butt cream, , Topical, TID, Assaker, Darrin, MD, Given at 01/01/24 2207   insulin  aspart (novoLOG ) injection 0-9 Units, 0-9 Units, Subcutaneous, Q4H, Autry, Lauren E, PA-C, 3 Units at 01/01/24 2358   levETIRAcetam  (KEPPRA ) undiluted injection 750 mg, 750 mg, Intravenous, BID, 750 mg at 01/01/24 2211 **OR** levETIRAcetam  (KEPPRA ) tablet 750 mg, 750 mg, Oral, BID, Khaliqdina, Salman, MD, 750 mg at 01/01/24 9083   levothyroxine  (SYNTHROID ) tablet 25 mcg, 25 mcg, Oral, Q0600, Autry, Lauren E, PA-C, 25 mcg at 01/02/24 0510   LORazepam  (ATIVAN ) injection 1 mg, 1 mg, Intravenous, Q4H PRN, Adolph, Lauren E, PA-C, 1 mg at 01/01/24 1059   multivitamin with minerals tablet 1 tablet, 1 tablet, Oral, Daily, Autry, Lauren E, PA-C, 1 tablet at 01/01/24 0916   norepinephrine  (LEVOPHED ) 4mg  in (0.016 mg/mL) premix infusion, 0-10 mcg/min, Intravenous, Titrated, Hussein, Lenny, MD, Stopped at 01/02/24 0448   ondansetron  (ZOFRAN ) injection 4 mg, 4 mg, Intravenous, Q6H PRN, Joelyn Satterfield, MD   Oral care mouth rinse, 15 mL, Mouth Rinse, 4 times per day, Sharie Lenny, MD, 15 mL at 01/02/24 9285   Oral care mouth rinse, 15 mL, Mouth Rinse, PRN, Sharie Lenny, MD   oxyCODONE  (Oxy IR/ROXICODONE ) immediate release tablet 5 mg, 5 mg, Oral, Q6H PRN, Osei, Lawrence, MD, 5 mg at 01/01/24 0417   pantoprazole  (PROTONIX ) EC tablet 40 mg, 40  mg, Oral, Daily, Hussein, Lenny, MD   PHENObarbital  (LUMINAL) injection 97.5 mg, 97.5 mg, Intravenous, Q8H, 97.5 mg at 01/02/24 0336 **FOLLOWED BY** PHENObarbital  (LUMINAL) injection 65 mg, 65 mg, Intravenous, Q8H **FOLLOWED BY** [START ON 01/04/2024] PHENObarbital  (LUMINAL) injection 32.5 mg, 32.5 mg, Intravenous, Q8H, Autry, Lauren E, PA-C   piperacillin -tazobactam (ZOSYN ) IVPB 3.375 g, 3.375 g, Intravenous, Q8H, Hussein, Lenny, MD, Last Rate: 12.5 mL/hr at 01/02/24 0738, Infusion Verify at 01/02/24 0738   polyethylene glycol  (MIRALAX  / GLYCOLAX ) packet 17 g, 17 g, Oral, Daily PRN, Adolph, Lauren E, PA-C   potassium chloride  (KLOR-CON ) packet 40 mEq, 40 mEq, Oral, BID, Autry, Lauren E, PA-C, 40 mEq at 12/31/23 2206   potassium chloride  10 mEq in 100 mL IVPB, 10 mEq, Intravenous, Q1 Hr x 2, Kassie Ards, MD, Last Rate: 100 mL/hr at 01/02/24 0848, 10 mEq at 01/02/24 0848   sodium bicarbonate  150 mEq in dextrose  5 % 1,150 mL infusion, , Intravenous, Continuous, Hussein, Lenny, MD, Last Rate: 75 mL/hr at 01/02/24 0850, New Bag at 01/02/24 0850   sodium chloride  flush (NS) 0.9 % injection 10-40 mL, 10-40 mL, Intracatheter, Q12H, Hussein, Lenny, MD, 10 mL at 01/01/24 2214   sodium chloride  flush (NS) 0.9 % injection 10-40 mL, 10-40 mL, Intracatheter, PRN, Sharie Lenny, MD   thiamine  (VITAMIN B1) 500 mg in sodium chloride  0.9 % 50 mL IVPB, 500 mg, Intravenous, TID, Stopped at 01/01/24 2243 **OR** thiamine  (VITAMIN B1) tablet 500 mg, 500 mg, Oral, TID, Hussein, Lenny, MD, 500 mg at 01/01/24 0924   [START ON 01/04/2024] thiamine  (VITAMIN B1) tablet 100 mg, 100 mg, Oral, Daily, Sharie Lenny, MD  Labs and Diagnostic Imaging   Calcium  5.8  Imaging(Personally reviewed): CT head no acute findings  Assessment   SHUNTAE HERZIG is a 58 y.o. female with breakthrough seizure in the setting of noncompliance with AEDs as well as multiple electrolyte abnormalities.   Recommendations  Continue home dose of Keppra  at 750 twice daily, encourage compliance at discharge Correction of metabolic/electrolyte abnormalities per internal medicine(particularly hypocalcemia and hypomag as this can significantly lower seizure threshold) Neurology will be available as needed, please call if further questions or concerns. ______________________________________________________________________   Bonney Aisha Seals, MD [facility]

## 2024-01-02 NOTE — Progress Notes (Signed)
 Patient refused all PO meds today.  Attempted several times to get her to take them but continued to refuse.

## 2024-01-02 NOTE — Progress Notes (Addendum)
 Date and time results received: 01/02/24 0628 (use smartphrase .now to insert current time)  Test: Ca Critical Value: 5.8  Name of Provider Notified: Kassie  Orders Received? Or Actions Taken?: see MAR

## 2024-01-03 DIAGNOSIS — K219 Gastro-esophageal reflux disease without esophagitis: Secondary | ICD-10-CM

## 2024-01-03 DIAGNOSIS — I10 Essential (primary) hypertension: Secondary | ICD-10-CM

## 2024-01-03 DIAGNOSIS — F10930 Alcohol use, unspecified with withdrawal, uncomplicated: Secondary | ICD-10-CM

## 2024-01-03 DIAGNOSIS — E872 Acidosis, unspecified: Secondary | ICD-10-CM

## 2024-01-03 LAB — CALCIUM, IONIZED
Calcium, Ionized, Serum: <3 mg/dL — ABNORMAL LOW (ref 4.5–5.6)
Calcium, Ionized, Serum: 3.3 mg/dL — ABNORMAL LOW (ref 4.5–5.6)

## 2024-01-03 LAB — GASTROINTESTINAL PANEL BY PCR, STOOL (REPLACES STOOL CULTURE)

## 2024-01-03 LAB — GLUCOSE, CAPILLARY
Glucose-Capillary: 109 mg/dL — ABNORMAL HIGH (ref 70–99)
Glucose-Capillary: 117 mg/dL — ABNORMAL HIGH (ref 70–99)
Glucose-Capillary: 121 mg/dL — ABNORMAL HIGH (ref 70–99)
Glucose-Capillary: 122 mg/dL — ABNORMAL HIGH (ref 70–99)
Glucose-Capillary: 160 mg/dL — ABNORMAL HIGH (ref 70–99)
Glucose-Capillary: 49 mg/dL — ABNORMAL LOW (ref 70–99)
Glucose-Capillary: 81 mg/dL (ref 70–99)

## 2024-01-03 LAB — PHOSPHORUS: Phosphorus: 2.9 mg/dL (ref 2.5–4.6)

## 2024-01-03 LAB — CBC
HCT: 24 % — ABNORMAL LOW (ref 36.0–46.0)
Hemoglobin: 8.7 g/dL — ABNORMAL LOW (ref 12.0–15.0)
MCH: 32 pg (ref 26.0–34.0)
MCHC: 36.3 g/dL — ABNORMAL HIGH (ref 30.0–36.0)
MCV: 88.2 fL (ref 80.0–100.0)
Platelets: 110 K/uL — ABNORMAL LOW (ref 150–400)
RBC: 2.72 MIL/uL — ABNORMAL LOW (ref 3.87–5.11)
RDW: 15.2 % (ref 11.5–15.5)
WBC: 8.7 K/uL (ref 4.0–10.5)
nRBC: 0.2 % (ref 0.0–0.2)

## 2024-01-03 LAB — BASIC METABOLIC PANEL WITH GFR
Anion gap: 12 (ref 5–15)
BUN: 17 mg/dL (ref 6–20)
CO2: 24 mmol/L (ref 22–32)
Calcium: 7.2 mg/dL — ABNORMAL LOW (ref 8.9–10.3)
Chloride: 104 mmol/L (ref 98–111)
Creatinine, Ser: 2.06 mg/dL — ABNORMAL HIGH (ref 0.44–1.00)
GFR, Estimated: 27 mL/min — ABNORMAL LOW (ref >60)
Glucose, Bld: 91 mg/dL (ref 70–99)
Potassium: 3.3 mmol/L — ABNORMAL LOW (ref 3.5–5.1)
Sodium: 140 mmol/L (ref 135–145)

## 2024-01-03 LAB — MAGNESIUM: Magnesium: 2.1 mg/dL (ref 1.7–2.4)

## 2024-01-03 MED ORDER — SODIUM CHLORIDE 0.9 % IV SOLN
4.0000 g | Freq: Once | INTRAVENOUS | Status: AC
  Administered 2024-01-03: 4 g via INTRAVENOUS
  Filled 2024-01-03: qty 40

## 2024-01-03 MED ORDER — POTASSIUM CHLORIDE 10 MEQ/100ML IV SOLN
10.0000 meq | INTRAVENOUS | Status: AC
  Administered 2024-01-03 (×2): 10 meq via INTRAVENOUS
  Filled 2024-01-03 (×2): qty 100

## 2024-01-03 MED ORDER — ORAL CARE MOUTH RINSE: 15.0000 mL | OROMUCOSAL | Status: DC | PRN

## 2024-01-03 MED ORDER — ENOXAPARIN SODIUM 30 MG/0.3ML IJ SOSY
30.0000 mg | PREFILLED_SYRINGE | Freq: Every day | INTRAMUSCULAR | Status: DC
  Administered 2024-01-03 – 2024-01-04 (×2): 30 mg via SUBCUTANEOUS
  Filled 2024-01-03 (×2): qty 0.3

## 2024-01-03 MED ORDER — NUTRISOURCE FIBER PO PACK
1.0000 | PACK | Freq: Two times a day (BID) | ORAL | Status: DC
  Administered 2024-01-03: 1
  Filled 2024-01-03 (×2): qty 1

## 2024-01-03 MED ORDER — POTASSIUM CHLORIDE 10 MEQ/50ML IV SOLN
10.0000 meq | INTRAVENOUS | Status: AC
  Administered 2024-01-03 (×2): 10 meq via INTRAVENOUS
  Filled 2024-01-03 (×3): qty 50

## 2024-01-03 MED ORDER — NUTRISOURCE FIBER PO PACK
1.0000 | PACK | Freq: Two times a day (BID) | ORAL | Status: DC
  Administered 2024-01-04: 1 via ORAL
  Filled 2024-01-03 (×4): qty 1

## 2024-01-03 MED ORDER — LOPERAMIDE HCL 2 MG PO CAPS
2.0000 mg | ORAL_CAPSULE | Freq: Once | ORAL | Status: AC
  Administered 2024-01-03: 2 mg via ORAL
  Filled 2024-01-03: qty 1

## 2024-01-03 NOTE — Plan of Care (Signed)
  Problem: Coping: Goal: Ability to adjust to condition or change in health will improve Outcome: Progressing   Problem: Fluid Volume: Goal: Ability to maintain a balanced intake and output will improve Outcome: Progressing   Problem: Health Behavior/Discharge Planning: Goal: Ability to identify and utilize available resources and services will improve Outcome: Progressing   Problem: Health Behavior/Discharge Planning: Goal: Ability to manage health-related needs will improve Outcome: Progressing   

## 2024-01-03 NOTE — Plan of Care (Signed)
  Problem: Fluid Volume: Goal: Ability to maintain a balanced intake and output will improve Outcome: Progressing   Problem: Metabolic: Goal: Ability to maintain appropriate glucose levels will improve Outcome: Progressing   Problem: Nutritional: Goal: Maintenance of adequate nutrition will improve Outcome: Progressing   Problem: Skin Integrity: Goal: Risk for impaired skin integrity will decrease Outcome: Progressing   Problem: Tissue Perfusion: Goal: Adequacy of tissue perfusion will improve Outcome: Progressing   Problem: Clinical Measurements: Goal: Ability to maintain clinical measurements within normal limits will improve Outcome: Progressing   Problem: Clinical Measurements: Goal: Will remain free from infection Outcome: Progressing   Problem: Clinical Measurements: Goal: Diagnostic test results will improve Outcome: Progressing   Plan of care, monitoring, assessment, treatment, and intervention (s) ongoing, see MAR see flowsheet

## 2024-01-03 NOTE — Progress Notes (Signed)
 NAME:  Christina Byrd, MRN:  [id number], DOB:  08/23/1965, LOS: 3 ADMISSION DATE:  12/31/2023, CONSULTATION DATE:  11/8 REFERRING MD: Charlyn, EDP CHIEF COMPLAINT: AMS   History of Present Illness:  58 year old female with past medical history of GERD, hypertension, alcohol use disorder, seizure disorder who presents to the ED with altered mental status. Per EMS report, called out by family as they found patient unresponsive. With EMS, found to have glucose of 29, hypotensive 70/40. Required BVM. On arrival to ED, hypothermic to 88 rectal. WBC 8, hgb 8.7, plt 65, NA 134, K 2.1, bicarb 14, BUN 32, sCr 2.62, calcium  4.3, AST 94, AG 23, ethanol 106, mag <0.5, lactic 5.5, UA +UTI,   Pertinent  Medical History  GERD, hypertension, alcohol use disorder, seizure disorder  Significant Hospital Events: Including procedures, antibiotic start and stop dates in addition to other pertinent events   11/8: ED for AMS, admit to ICU   Interim History / Subjective:  No acute event overnight, still having significant need for electrolyte replacement.  Is alert and oriented x 3.   Objective   Blood pressure 127/87, pulse 81, temperature 98.1 F (36.7 C), resp. rate 11, height 5' 6 (1.676 m), weight 50.6 kg, last menstrual period 08/23/2011, SpO2 97%.        Intake/Output Summary (Last 24 hours) at 01/03/2024 0803 Last data filed at 01/03/2024 0500 Gross per 24 hour  Intake 1794.65 ml  Output 1850 ml  Net -55.35 ml   Filed Weights   01/01/24 0400 01/02/24 0337 01/03/24 0500  Weight: 46 kg 46 kg 50.6 kg    Examination:  General: Elderly female appears disheveled not in any distress Lungs: clear to auscultation bilaterally.  Heart: regular rate rhythm, no murmur appreciated.  Abdomen: non tender, non distended. Normal BS.  Neuro: Alert and oriented x 3.  Is moving all extremities.  Resolved Hospital Problem list    Assessment & Plan:  58 year old female with history of HTN, alcohol use  disorder, seizure disorder, presents seizures, alcohol use disorder, hypoglycemia, electrolyte abnormalities, and sepsis.  Acute metabolic encephalopathy-resolved Multifactorial: Seizures, alcohol use-alcohol level 106, hypoglycemia, sepsis History of epilepsy, non compliant on home medication  Multifactorial in setting of chronic heavy alcohol use, hypoglycemia, c/f seizures at home and in ED and severe metabolic derangements. CT head negative. UA with UTI.  EEG without any seizures. Status post load of Keppra .  Is on Keppra  maintenance per her home dose [reduced dose performed renal function].  Continue. - seizure precautions, delirium precautions  - Will transfer out of the ICU  Questionable sepsis  Hypothermia  Lactic acidosis Distributive shock-likely septic shock-resolved Hypothermia to 88 on arrival; no leukocytosis. UA equivocal for UTI. CXR without pneumonia. Suspect lactic acidosis is from seizure activity and not hypoperfusion as she is not hypotensive on exam. No evidence of ischemia.  - Urine culture is negative for UTI. - Was on Zosyn .  Will DC.  No clear source of infection.  Alcohol use disorder  Alcohol withdrawal-on phenobarb Per significant other at bedside, drinks liquor up to 1/5 day. States she is drinking all the time. Last drink 0300 11/8. Etoh 106 on presentation - CIWA monitoring, on phenobarb taper.  On Ativan  as needed. 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 BUN 32, sCr 2.62, baseline <1. CK 453. UA equivocal UTI - Improving.  Monitor creatinine.  Avoid nephrotoxic agents.  Refeeding syndrome Prolonged Qtc  Hypokalemia Hypomagnesemia  Hypocalcemia  Hyponatremia-resolved K 2.1, Ca 4.3 and mag <0.5 in ED  - Replace as  needed.   Chronic anemia with worsening: - Status post 1 unit PRBC.  Now hemoglobin stable.  GERD  - ppi daily   Hypothyroidism  - Home Synthroid   History of hypertension  hyperlipidemia - con't statin   Severe protein calorie malnutrition  - Nutrition following.   DVT prophylaxis: Lovenox . Not on fluids. Regular diet.  Will transfer out of ICU.  Remove Foley.   Labs   CBC: Recent Labs  Lab 12/27/23 1003 12/31/23 1636 12/31/23 1640 01/01/24 0507 01/01/24 1215 01/01/24 1744 01/02/24 1122 01/03/24 0512  WBC 1.5*  --  8.0 11.6*  --   --  9.2 8.7  NEUTROABS 0.6*  --  6.3 10.7*  --   --   --   --   HGB 8.2*   < > 8.7* 6.0* 5.7* 10.1* 7.9* 8.7*  HCT 23.3*   < > 25.6* 16.9* 16.6* 29.6* 21.4* 24.0*  MCV 91.4  --  93.8 88.5  --   --  87.0 88.2  PLT 36*  --  65* 67*  --   --  81* 110*   < > = values in this interval not displayed.    Basic Metabolic Panel: Recent Labs  Lab 01/01/24 1634 01/01/24 1744 01/02/24 0525 01/02/24 1330 01/02/24 1720 01/02/24 2147 01/03/24 0512  NA 132* 133* 139 136  --  142 140  K 3.7 3.7 2.9* 3.8  --  3.8 3.3*  CL 102 103 108 104  --  104 104  CO2 16* 14* 17* 18*  --  20* 24  GLUCOSE 205* 192* 112* 139*  --  176* 91  BUN 24* 23* 19 20  --  20 17  CREATININE 2.01* 2.05* 1.96* 2.23*  --  2.11* 2.06*  CALCIUM  6.1* 6.4* 5.8* 7.9*  --  7.4* 7.2*  MG 2.4 2.5* 1.8  --  2.3  --  2.1  PHOS 5.1* 5.2* 3.5  --  3.2  --  2.9   GFR: Estimated Creatinine Clearance: 23.8 mL/min (A) (by C-G formula based on SCr of 2.06 mg/dL (H)). Recent Labs  Lab 12/31/23 1637 12/31/23 1640 12/31/23 1854 01/01/24 0235 01/01/24 0507 01/02/24 1122 01/03/24 0512  WBC  --  8.0  --   --  11.6* 9.2 8.7  LATICACIDVEN 5.5*  --  9.7* 1.9 1.9  --   --     Liver Function Tests: Recent Labs  Lab 12/31/23 1640  AST 94*  ALT 14  ALKPHOS 95  BILITOT 0.6  PROT 6.1*  ALBUMIN 2.3*   No results for input(s): LIPASE, AMYLASE in the last 168 hours. Recent  Labs  Lab 01/01/24 0008  AMMONIA 34    ABG    Component Value Date/Time   HCO3 14.5 (L) 12/31/2023 1636   TCO2 15 (L) 12/31/2023 1636   ACIDBASEDEF 12.0 (H) 12/31/2023 1636   O2SAT 74 12/31/2023 1636     Coagulation Profile: Recent Labs  Lab 12/27/23 1003  INR 1.2    Cardiac Enzymes: Recent Labs  Lab 12/31/23 1913  CKTOTAL 453*    HbA1C: Hemoglobin A1C  Date/Time Value Ref Range Status  05/26/2023 04:40 PM 3.7 (A) 4.0 - 5.6 % Final    Comment:  Results reported to Dr Harrie 04-03-025 1652 T Fulcher, PBT   HbA1c, POC (controlled diabetic range)  Date/Time Value Ref Range Status  03/24/2021 11:05 AM 5.5 0.0 - 7.0 % Final  11/17/2020 04:13 PM 5.4 0.0 - 7.0 % Final   Hgb A1c MFr Bld  Date/Time Value Ref Range Status  01/01/2024 12:08 AM 5.2 4.8 - 5.6 % Final    Comment:    (NOTE) Diagnosis of Diabetes The following HbA1c ranges recommended by the American Diabetes Association (ADA) may be used as an aid in the diagnosis of diabetes mellitus.  Hemoglobin             Suggested A1C NGSP%              Diagnosis  <5.7                   Non Diabetic  5.7-6.4                Pre-Diabetic  >6.4                   Diabetic  <7.0                   Glycemic control for                       adults with diabetes.    07/11/2022 04:03 AM 5.0 4.8 - 5.6 % Final    Comment:    (NOTE) Pre diabetes:          5.7%-6.4%  Diabetes:              >6.4%  Glycemic control for   <7.0% adults with diabetes     CBG: Recent Labs  Lab 01/02/24 1534 01/02/24 1955 01/02/24 2323 01/03/24 0319 01/03/24 0732  GLUCAP 150* 130* 188* 109* 121*    Review of Systems:   As above  Past Medical History:  She,  has a past medical history of GASTROESOPHAGEAL REFLUX, NO ESOPHAGITIS (04/21/2006), GERD (gastroesophageal reflux disease) (Dx 1995), Hot flash, menopausal (06/22/2016), Hot flashes (01/30/2020), HTN (hypertension), Hypotension (10/21/2022), Intertrigo (06/22/2016),  Nausea & vomiting (12/23/2022), Nausea and vomiting (12/24/2022), Pain and swelling of left ankle (12/02/2014), Seizures (HCC), Tendinitis of right hip flexor (07/23/2014), and Tendonitis, Achilles, right (04/01/2014).   Surgical History:   Past Surgical History:  Procedure Laterality Date   BIOPSY  06/03/2022   Procedure: BIOPSY;  Surgeon: Stacia Glendia BRAVO, MD;  Location: [facility] ENDOSCOPY;  Service: Gastroenterology;;   COLONOSCOPY N/A 06/21/2023   Procedure: COLONOSCOPY;  Surgeon: Albertus Gordy HERO, MD;  Location: [facility] ENDOSCOPY;  Service: Gastroenterology;  Laterality: N/A;   ESOPHAGOGASTRODUODENOSCOPY N/A 12/29/2022   Procedure: ESOPHAGOGASTRODUODENOSCOPY (EGD);  Surgeon: Nandigam, Kavitha V, MD;  Location: [facility] ENDOSCOPY;  Service: Gastroenterology;  Laterality: N/A;   ESOPHAGOGASTRODUODENOSCOPY N/A 06/21/2023   Procedure: EGD (ESOPHAGOGASTRODUODENOSCOPY);  Surgeon: Albertus Gordy HERO, MD;  Location: [facility] ENDOSCOPY;  Service: Gastroenterology;  Laterality: N/A;   ESOPHAGOGASTRODUODENOSCOPY (EGD) WITH PROPOFOL  N/A 06/03/2022   Procedure: ESOPHAGOGASTRODUODENOSCOPY (EGD) WITH PROPOFOL ;  Surgeon: Stacia Glendia BRAVO, MD;  Location: [facility] ENDOSCOPY;  Service: Gastroenterology;  Laterality: N/A;   HOT HEMOSTASIS N/A 06/03/2022   Procedure: HOT HEMOSTASIS (ARGON PLASMA COAGULATION/BICAP);  Surgeon: Stacia Glendia BRAVO, MD;  Location: [facility] ENDOSCOPY;  Service: Gastroenterology;  Laterality: N/A;     Social History:   reports that she quit smoking about 11 years ago. Her smoking use included cigarettes. She has never used smokeless tobacco.  She reports that she does not currently use alcohol after a past usage of about 49.0 standard drinks of alcohol per week. She reports that she does not use drugs.   Family History:  Her family history includes CAD in her mother.   Allergies Allergies  Allergen Reactions   Levaquin  [Levofloxacin  In D5w] Rash   Losartan  Potassium Rash   Ace Inhibitors Cough   Cefepime  Rash     09/04/12 pm Patient started to break out with small macules after IV Vanco infusion, then macules increased in size after starting cefepime  infusion.   Chlorhexidine Itching and Rash   Codeine  Nausea Only   Vancomycin  Rash    09/04/12 pm Patient started to break out with small macules after IV Vanco infusion, then macules increased in size after starting cefepime  infusion. Tolerated in May 2025     Home Medications  Prior to Admission medications   Medication Sig Start Date End Date Taking? Authorizing Provider  acetaminophen  (TYLENOL ) 325 MG tablet Take 2 tablets (650 mg total) by mouth every 6 (six) hours as needed for mild pain (pain score 1-3) or fever (or Fever >/= 101). 07/27/23   Kandis Perkins, DO  allopurinol  (ZYLOPRIM ) 100 MG tablet Take 1 tablet (100 mg total) by mouth daily. 12/09/23   Harrie Bruckner, DO  atorvastatin  (LIPITOR) 20 MG tablet Take 1 tablet (20 mg total) by mouth daily. 10/20/23   Shawn Sick, MD  cetirizine  (ZYRTEC  ALLERGY) 10 MG tablet Take 1 tablet (10 mg total) by mouth daily. 12/08/23 12/07/24  Harrie Bruckner, DO  diclofenac  Sodium (VOLTAREN ) 1 % GEL Apply 4 g topically 4 (four) times daily. 10/27/23   Harrie Bruckner, DO  DULoxetine  (CYMBALTA ) 30 MG capsule Take 1 capsule (30 mg total) by mouth daily. 10/20/23 10/19/24  Shawn Sick, MD  feeding supplement (ENSURE ENLIVE / ENSURE PLUS) LIQD Take 237 mLs by mouth 3 (three) times daily between meals. Patient not taking: Reported on 10/21/2023 12/30/22   Fernand Prost, MD  folic acid  (FOLVITE ) 1 MG tablet Take 1 tablet (1 mg total) by mouth daily. 12/08/23   Harrie Bruckner, DO  levETIRAcetam  (KEPPRA ) 750 MG tablet Take 1 tablet (750 mg total) by mouth 2 (two) times daily. 12/08/23   Harrie Bruckner, DO  levothyroxine  (SYNTHROID ) 25 MCG tablet Take 1 tablet (25 mcg total) by mouth daily before breakfast. 10/20/23   Shawn Sick, MD  magnesium  oxide (MAG-OX) 400 (240 Mg) MG tablet Take 1 tablet (400 mg  total) by mouth daily. Patient not taking: Reported on 10/21/2023 07/27/23   Kandis Perkins, DO  melatonin 3 MG TABS tablet Take 1 tablet (3 mg total) by mouth at bedtime. 10/20/23   Norrine Sharper, MD  Multiple Vitamin (MULTIVITAMIN WITH MINERALS) TABS tablet Take 1 tablet by mouth daily. Patient not taking: Reported on 10/21/2023 05/03/23   Harrie Bruckner, DO  naltrexone  (DEPADE) 50 MG tablet Take 1 tablet (50 mg total) by mouth daily. 10/27/23   Harrie Bruckner, DO  ondansetron  (ZOFRAN -ODT) 4 MG disintegrating tablet Take 1 tablet (4 mg total) by mouth every 8 (eight) hours as needed for nausea or vomiting. 12/08/23   Harrie Bruckner, DO  pantoprazole  (PROTONIX ) 40 MG tablet Take 1 tablet (40 mg total) by mouth daily. 12/08/23   Harrie Bruckner, DO  predniSONE  (DELTASONE ) 20 MG tablet Take 20 mg by mouth. 12/21/23   [provider]  thiamine  (VITAMIN B1) 100 MG tablet Take 1 tablet (100 mg total) by mouth daily. 12/08/23   Harrie Bruckner,  DO  metFORMIN  (GLUCOPHAGE ) 500 MG tablet Take 0.5 tablets (250 mg total) by mouth daily with breakfast. 03/05/20 04/02/20  Danton Jon HERO, PA-C   Total critical care time: 51 minutes   Care was time spent personally by me on the following activities: development of treatment plan with patient and/or surrogate as well as nursing, discussions with consultants, evaluation of patient's response to treatment, examination of patient, obtaining history from patient or surrogate, ordering and performing treatments and interventions, ordering and review of laboratory studies, ordering and review of radiographic studies, pulse oximetry, re-evaluation of patient's condition and participation in multidisciplinary rounds.  Sammi JONETTA Fredericks, MD Pulmonary, Critical Care and Sleep Attending.  Pager: [phone]  01/03/2024, 9:28 AM

## 2024-01-03 NOTE — Progress Notes (Signed)
  [facility]  Transfer note  Patient is stable to transfer out of ICU per PCCM team. IMTS will assume care of patient on 11/12 at 7AM.   Christina Kristy Ahr, MD [facility] - PGY-3 On call pager: [phone]

## 2024-01-03 NOTE — Progress Notes (Signed)
 eLink Physician-Brief Progress Note Patient Name: Christina Byrd DOB: [birth year] MRN: [id number]   Date of Service  01/03/2024  HPI/Events of Note  K+ 3.3, Cr 2.32  eICU Interventions  KCL 20 meq iv x 1 ordered.        Jeremyah Jelley U Vannia Pola 01/03/2024, 7:04 AM

## 2024-01-04 DIAGNOSIS — N390 Urinary tract infection, site not specified: Secondary | ICD-10-CM | POA: Diagnosis not present

## 2024-01-04 DIAGNOSIS — N179 Acute kidney failure, unspecified: Secondary | ICD-10-CM | POA: Diagnosis not present

## 2024-01-04 DIAGNOSIS — E876 Hypokalemia: Secondary | ICD-10-CM

## 2024-01-04 DIAGNOSIS — E162 Hypoglycemia, unspecified: Principal | ICD-10-CM

## 2024-01-04 DIAGNOSIS — R569 Unspecified convulsions: Secondary | ICD-10-CM | POA: Diagnosis not present

## 2024-01-04 DIAGNOSIS — T68XXXA Hypothermia, initial encounter: Secondary | ICD-10-CM

## 2024-01-04 DIAGNOSIS — F10931 Alcohol use, unspecified with withdrawal delirium: Secondary | ICD-10-CM | POA: Diagnosis not present

## 2024-01-04 DIAGNOSIS — Z79899 Other long term (current) drug therapy: Secondary | ICD-10-CM

## 2024-01-04 DIAGNOSIS — D649 Anemia, unspecified: Secondary | ICD-10-CM

## 2024-01-04 DIAGNOSIS — B964 Proteus (mirabilis) (morganii) as the cause of diseases classified elsewhere: Secondary | ICD-10-CM

## 2024-01-04 LAB — BASIC METABOLIC PANEL WITH GFR
Anion gap: 11 (ref 5–15)
BUN: 15 mg/dL (ref 6–20)
CO2: 23 mmol/L (ref 22–32)
Calcium: 7.7 mg/dL — ABNORMAL LOW (ref 8.9–10.3)
Chloride: 105 mmol/L (ref 98–111)
Creatinine, Ser: 1.91 mg/dL — ABNORMAL HIGH (ref 0.44–1.00)
GFR, Estimated: 30 mL/min — ABNORMAL LOW (ref >60)
Glucose, Bld: 58 mg/dL — ABNORMAL LOW (ref 70–99)
Potassium: 3.3 mmol/L — ABNORMAL LOW (ref 3.5–5.1)
Sodium: 139 mmol/L (ref 135–145)

## 2024-01-04 LAB — CBC
HCT: 23.7 % — ABNORMAL LOW (ref 36.0–46.0)
Hemoglobin: 8.5 g/dL — ABNORMAL LOW (ref 12.0–15.0)
MCH: 32 pg (ref 26.0–34.0)
MCHC: 35.9 g/dL (ref 30.0–36.0)
MCV: 89.1 fL (ref 80.0–100.0)
Platelets: 124 K/uL — ABNORMAL LOW (ref 150–400)
RBC: 2.66 MIL/uL — ABNORMAL LOW (ref 3.87–5.11)
RDW: 15 % (ref 11.5–15.5)
WBC: 8.1 K/uL (ref 4.0–10.5)
nRBC: 0 % (ref 0.0–0.2)

## 2024-01-04 LAB — HEPATIC FUNCTION PANEL
ALT: 9 U/L (ref 0–44)
AST: 15 U/L (ref 15–41)
Albumin: 1.6 g/dL — ABNORMAL LOW (ref 3.5–5.0)
Alkaline Phosphatase: 72 U/L (ref 38–126)
Bilirubin, Direct: <0.1 mg/dL (ref 0.0–0.2)
Total Bilirubin: 0.5 mg/dL (ref 0.0–1.2)
Total Protein: 4.3 g/dL — ABNORMAL LOW (ref 6.5–8.1)

## 2024-01-04 LAB — GLUCOSE, CAPILLARY
Glucose-Capillary: 47 mg/dL — ABNORMAL LOW (ref 70–99)
Glucose-Capillary: 42 mg/dL — CL (ref 70–99)
Glucose-Capillary: 118 mg/dL — ABNORMAL HIGH (ref 70–99)
Glucose-Capillary: 91 mg/dL (ref 70–99)
Glucose-Capillary: 77 mg/dL (ref 70–99)
Glucose-Capillary: 114 mg/dL — ABNORMAL HIGH (ref 70–99)
Glucose-Capillary: 65 mg/dL — ABNORMAL LOW (ref 70–99)
Glucose-Capillary: 122 mg/dL — ABNORMAL HIGH (ref 70–99)

## 2024-01-04 LAB — PHOSPHORUS: Phosphorus: 2.8 mg/dL (ref 2.5–4.6)

## 2024-01-04 LAB — MAGNESIUM: Magnesium: 1.7 mg/dL (ref 1.7–2.4)

## 2024-01-04 MED ORDER — THIAMINE HCL 100 MG/ML IJ SOLN
250.0000 mg | Freq: Every day | INTRAVENOUS | Status: DC
  Administered 2024-01-04: 250 mg via INTRAVENOUS
  Filled 2024-01-04 (×2): qty 2.5

## 2024-01-04 MED ORDER — DEXTROSE 50 % IV SOLN
12.5000 g | INTRAVENOUS | Status: AC
  Administered 2024-01-04: 12.5 g via INTRAVENOUS
  Filled 2024-01-04: qty 50

## 2024-01-04 MED ORDER — CALCIUM GLUCONATE-NACL 2-0.675 GM/100ML-% IV SOLN
2.0000 g | Freq: Once | INTRAVENOUS | Status: AC
  Administered 2024-01-04: 2000 mg via INTRAVENOUS
  Filled 2024-01-04: qty 100

## 2024-01-04 MED ORDER — LOPERAMIDE HCL 2 MG PO CAPS
2.0000 mg | ORAL_CAPSULE | ORAL | Status: DC | PRN
Start: 2024-01-04 — End: 2024-01-05

## 2024-01-04 MED ORDER — INSULIN ASPART 100 UNIT/ML IJ SOLN: 0.0000 [IU] | Freq: Three times a day (TID) | INTRAMUSCULAR | Status: DC

## 2024-01-04 MED ORDER — DEXTROSE 50 % IV SOLN
INTRAVENOUS | Status: AC
  Filled 2024-01-04: qty 50

## 2024-01-04 MED ORDER — INSULIN ASPART 100 UNIT/ML IJ SOLN: 0.0000 [IU] | Freq: Every day | INTRAMUSCULAR | Status: DC

## 2024-01-04 MED ORDER — POTASSIUM CHLORIDE CRYS ER 20 MEQ PO TBCR
40.0000 meq | EXTENDED_RELEASE_TABLET | Freq: Once | ORAL | Status: AC
  Administered 2024-01-04: 40 meq via ORAL
  Filled 2024-01-04: qty 2

## 2024-01-04 MED ORDER — DEXTROSE 50 % IV SOLN
12.5000 g | INTRAVENOUS | Status: AC
  Administered 2024-01-04: 12.5 g via INTRAVENOUS

## 2024-01-04 MED ORDER — MAGNESIUM SULFATE 2 GM/50ML IV SOLN
2.0000 g | Freq: Once | INTRAVENOUS | Status: AC
  Administered 2024-01-04: 2 g via INTRAVENOUS
  Filled 2024-01-04: qty 50

## 2024-01-04 NOTE — Evaluation (Signed)
 Physical Therapy Evaluation Patient Details Name: Christina Byrd MRN: [id number] DOB: 07/29/1965 Today's Date: 01/04/2024  History of Present Illness  58 y/o F presenting to ED on 11/8 with AMS in setting of chronic alcohol use, hypoglycemia, seizures, CT head negative, UA with UTI. EEG negative.    PMH includes GERD, HTN, alcohol use disorder, seizure disorder  Clinical Impression  PTA, pt modI with all ADLs and lives with significant other. She states she does not use an AD at home and denies any falls in the last 6 months. Currently, pt minA for supine>sit with most assist for trunk management, and supervision for sit>supine. Demonstrated good ability to power into standing, but reached for handheld assist to maintain balance. Pt refused use of RW and cane for ambulation. Able to ambulate 150' with unilateral handheld assist, with pt demonstrating 5-6 LOB requiring minA to correct. Educated on safe techniques to recover and maintain balance with gait. When asked if pt would consider initially using AD upon d/c home, she stated she would think about it. Recommend HH PT upon d/c to continue improving safety with in home ambulation. Acute PT to follow to address balance and activity tolerance deficits.         If plan is discharge home, recommend the following: Assist for transportation;A little help with walking and/or transfers;A little help with bathing/dressing/bathroom;Supervision due to cognitive status   Can travel by private vehicle        Equipment Recommendations None recommended by PT  Recommendations for Other Services       Functional Status Assessment Patient has had a recent decline in their functional status and demonstrates the ability to make significant improvements in function in a reasonable and predictable amount of time.     Precautions / Restrictions Precautions Precautions: Fall Recall of Precautions/Restrictions: Impaired Precaution/Restrictions Comments:  Refuses use of RW and cane to assist with balance during gait. Restrictions Weight Bearing Restrictions Per Provider Order: No      Mobility  Bed Mobility Overal bed mobility: Needs Assistance Bed Mobility: Supine to Sit, Sit to Supine     Supine to sit: Min assist Sit to supine: Supervision   General bed mobility comments: minA for trunk management during supine>sit.    Transfers Overall transfer level: Needs assistance Equipment used: 1 person hand held assist Transfers: Sit to/from Stand Sit to Stand: Contact guard assist           General transfer comment: No physical assist required to power up, just CGA for safety    Ambulation/Gait Ambulation/Gait assistance: Min assist Gait Distance (Feet): 150 Feet Assistive device: 1 person hand held assist Gait Pattern/deviations: Decreased step length - right, Decreased step length - left, Step-through pattern, Decreased dorsiflexion - right, Decreased dorsiflexion - left, Staggering left, Staggering right Gait velocity: Dec Gait velocity interpretation: <1.8 ft/sec, indicate of risk for recurrent falls   General Gait Details: Noted 5-6 instances of LOB resulting in staggering and needing minA to maintain upright. Pt consistently pushing down through unilateral handheld assist for maintenance of balance. Pt refused use of RW and cane for ambulation, but could benefit from one.  Stairs            Wheelchair Mobility     Tilt Bed    Modified Rankin (Stroke Patients Only)       Balance Overall balance assessment: Needs assistance Sitting-balance support: Feet supported Sitting balance-Leahy Scale: Fair     Standing balance support: During functional activity Standing balance-Leahy Scale:  Poor Standing balance comment: Reliant on handheld assist for balance                             Pertinent Vitals/Pain Pain Assessment Pain Assessment: No/denies pain    Home Living Family/patient expects  to be discharged to:: Private residence Living Arrangements: Spouse/significant other Available Help at Discharge: Family;Available PRN/intermittently Type of Home: House Home Access: Ramped entrance       Home Layout: One level Home Equipment: Agricultural Consultant (2 wheels);BSC/3in1;Shower seat Additional Comments: pt reports her house is too cluttered for a RW to fit inside    Prior Function Prior Level of Function : Independent/Modified Independent             Mobility Comments: no AD use ADLs Comments: reports ind with ADLs and she also shares IADL duties with her spouse     Extremity/Trunk Assessment   Upper Extremity Assessment Upper Extremity Assessment: Defer to OT evaluation    Lower Extremity Assessment Lower Extremity Assessment: Generalized weakness    Cervical / Trunk Assessment Cervical / Trunk Assessment: Kyphotic  Communication   Communication Communication: Impaired Factors Affecting Communication: Reduced clarity of speech    Cognition Arousal: Alert Behavior During Therapy: WFL for tasks assessed/performed, Impulsive   PT - Cognitive impairments: Orientation, Problem solving, Safety/Judgement   Orientation impairments: Time                   PT - Cognition Comments: Stated today was Friday and June 5th initially, but with VC able to state the month. Following commands: Intact       Cueing Cueing Techniques: Verbal cues, Gestural cues     General Comments General comments (skin integrity, edema, etc.): Discussed possiblity of using RW or cane at least initially upon d/c, and pt stated she would think about it.    Exercises     Assessment/Plan    PT Assessment Patient needs continued PT services  PT Problem List Decreased strength;Decreased range of motion;Decreased activity tolerance;Decreased balance;Decreased mobility;Decreased knowledge of use of DME;Decreased safety awareness;Decreased cognition       PT Treatment  Interventions DME instruction;Gait training;Stair training;Functional mobility training;Therapeutic activities;Therapeutic exercise;Balance training;Neuromuscular re-education;Patient/family education;Manual techniques;Modalities;Cognitive remediation    PT Goals (Current goals can be found in the Care Plan section)  Acute Rehab PT Goals Patient Stated Goal: to go home PT Goal Formulation: With patient Time For Goal Achievement: 01/18/24 Potential to Achieve Goals: Good    Frequency Min 2X/week     Co-evaluation               AM-PAC PT 6 Clicks Mobility  Outcome Measure Help needed turning from your back to your side while in a flat bed without using bedrails?: None Help needed moving from lying on your back to sitting on the side of a flat bed without using bedrails?: A Little Help needed moving to and from a bed to a chair (including a wheelchair)?: A Little Help needed standing up from a chair using your arms (e.g., wheelchair or bedside chair)?: A Little Help needed to walk in hospital room?: A Little Help needed climbing 3-5 steps with a railing? : A Little 6 Click Score: 19    End of Session Equipment Utilized During Treatment: Gait belt Activity Tolerance: Patient tolerated treatment well Patient left: in bed;with call bell/phone within reach;with bed alarm set Nurse Communication: Mobility status PT Visit Diagnosis: Unsteadiness on feet (R26.81);Other abnormalities of  gait and mobility (R26.89);Muscle weakness (generalized) (M62.81)    Time: 8641-8580 PT Time Calculation (min) (ACUTE ONLY): 21 min   Charges:   PT Evaluation $PT Eval Low Complexity: 1 Low   PT General Charges $$ ACUTE PT VISIT: 1 Visit         Ellakate Gonsalves, SPT   Dellanira Dillow 01/04/2024, 2:47 PM

## 2024-01-04 NOTE — Plan of Care (Signed)

## 2024-01-04 NOTE — TOC CM/SW Note (Signed)
 Transition of Care [facility]) - Inpatient Brief Assessment   Patient Details  Name: Christina Byrd MRN: [id number] Date of Birth: Apr 19, 1965  Transition of Care [facility]) CM/SW Contact:    Tom-Johnson, Harvest Muskrat, RN Phone Number: 01/04/2024, 3:26 PM   Clinical Narrative:  Patient presented to the ED after being found in her room unresponsive. Patient had a Seizure episode in the ED, Neurology following. Has hx of GERD, HTN, ETOH on CIWA precaution, Seizure disorder. Completed IV abx course for UTI.    CM spoke with patient t bedside about needs for post hospital transition. From home with Significant other, [name], has a supportive son. Does not drive, [name] transports to and from appointments. Has all necessary DME's at home. 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  PCP is Harrie Bruckner, DO and uses [facility] on [address]    Home health recommended, patient states she has no preference. CM sent referral to [facility] through the HUB and [facility]ard noted acceptance, info on AVS.  Patient not Medically ready for discharge.  CM will continue to follow as patient progresses with care towards discharge.        Transition of Care Asessment: Insurance and Status: Insurance coverage has been reviewed Patient has primary care physician: Yes Home environment has been reviewed: Yes Prior level of function:: Modified independent

## 2024-01-04 NOTE — Evaluation (Signed)
 Occupational Therapy Evaluation Patient Details Name: Christina Byrd MRN: [id number] DOB: [birth year] Today's Date: 01/04/2024   History of Present Illness   58 y/o F presenting to ED on 11/8 with AMS in setting of chronic alcohols use, hypoglycemia, seizures, CT head negative, UA with UTI. EEG negative.    PMH includes GERD, HTN, alcohol use disorder, seizure disorder     Clinical Impressions Pt reports ind at baseline with ADLs and ambulates without AD per pt. Pt lives with spouse who can assist her. Pt currently needs up to mod A for ADLs, supervision-min A for bed mobility and min A for ADLs with 1 person HHA. Pt reaching out for external support with ambulation, would benefit from RW however per chart review will not fit/be able to navigate in pt's home. Pt presenting with impairments listed below, will follow acutely. Recommend HHOT at d/c given pt can have assist from family, if unable may benefit from postacute rehab.     If plan is discharge home, recommend the following:   A little help with walking and/or transfers;A lot of help with bathing/dressing/bathroom;Assistance with cooking/housework;Direct supervision/assist for medications management;Direct supervision/assist for financial management;Assist for transportation;Supervision due to cognitive status     Functional Status Assessment   Patient has had a recent decline in their functional status and demonstrates the ability to make significant improvements in function in a reasonable and predictable amount of time.     Equipment Recommendations   None recommended by OT     Recommendations for Other Services   PT consult     Precautions/Restrictions   Precautions Precautions: Fall Restrictions Weight Bearing Restrictions Per Provider Order: No     Mobility Bed Mobility Overal bed mobility: Needs Assistance Bed Mobility: Supine to Sit, Sit to Supine     Supine to sit: Min assist Sit to supine:  Supervision   General bed mobility comments: pt crawling back into bed in quadruped position    Transfers Overall transfer level: Needs assistance Equipment used: 1 person hand held assist Transfers: Sit to/from Stand Sit to Stand: Min assist                  Balance Overall balance assessment: Needs assistance Sitting-balance support: Feet supported Sitting balance-Leahy Scale: Fair     Standing balance support: During functional activity Standing balance-Leahy Scale: Poor Standing balance comment: reaching out for external support wtih ambulation                           ADL either performed or assessed with clinical judgement   ADL Overall ADL's : Needs assistance/impaired Eating/Feeding: Set up   Grooming: Set up   Upper Body Bathing: Minimal assistance   Lower Body Bathing: Moderate assistance   Upper Body Dressing : Minimal assistance   Lower Body Dressing: Moderate assistance   Toilet Transfer: Minimal assistance           Functional mobility during ADLs: Minimal assistance       Vision   Vision Assessment?: No apparent visual deficits Additional Comments: not formally assessed     Perception         Praxis         Pertinent Vitals/Pain Pain Assessment Pain Assessment: No/denies pain     Extremity/Trunk Assessment Upper Extremity Assessment Upper Extremity Assessment: Generalized weakness   Lower Extremity Assessment Lower Extremity Assessment: Defer to PT evaluation   Cervical / Trunk Assessment Cervical / Trunk Assessment:  Kyphotic   Communication Communication Communication: Impaired   Cognition Arousal: Alert Behavior During Therapy: WFL for tasks assessed/performed, Impulsive Cognition: No family/caregiver present to determine baseline             OT - Cognition Comments: pt with delayed responses, incr time for command follow; states she is at the hospital for seizures                  Following commands: Intact       Cueing  General Comments   Cueing Techniques: Verbal cues  VSS   Exercises     Shoulder Instructions      Home Living Family/patient expects to be discharged to:: Private residence Living Arrangements: Spouse/significant other Available Help at Discharge: Family;Available PRN/intermittently Type of Home: House Home Access: Ramped entrance     Home Layout: One level     Bathroom Shower/Tub: Chief Strategy Officer: Standard     Home Equipment: Agricultural Consultant (2 wheels);BSC/3in1;Shower seat          Prior Functioning/Environment Prior Level of Function : Independent/Modified Independent;History of Falls (last six months)             Mobility Comments: no AD use ADLs Comments: reports ind with ADLs and she also shares IADL duties with her spouse    OT Problem List: Decreased strength;Decreased range of motion;Decreased activity tolerance;Impaired balance (sitting and/or standing);Decreased cognition;Decreased safety awareness   OT Treatment/Interventions: Self-care/ADL training;Therapeutic exercise;Energy conservation;DME and/or AE instruction;Cognitive remediation/compensation;Therapeutic activities;Visual/perceptual remediation/compensation;Patient/family education;Balance training      OT Goals(Current goals can be found in the care plan section)   Acute Rehab OT Goals Patient Stated Goal: to go to the bathroom OT Goal Formulation: With patient Time For Goal Achievement: 01/18/24 Potential to Achieve Goals: Good ADL Goals Pt Will Perform Upper Body Dressing: with modified independence;sitting Pt Will Perform Lower Body Dressing: with modified independence;sitting/lateral leans;sit to/from stand Pt Will Transfer to Toilet: with modified independence;ambulating;regular height toilet Pt Will Perform Tub/Shower Transfer: Tub transfer;Shower transfer;with modified independence;ambulating   OT Frequency:  Min  2X/week    Co-evaluation              AM-PAC OT 6 Clicks Daily Activity     Outcome Measure Help from another person eating meals?: A Little Help from another person taking care of personal grooming?: A Little Help from another person toileting, which includes using toliet, bedpan, or urinal?: A Little Help from another person bathing (including washing, rinsing, drying)?: A Lot Help from another person to put on and taking off regular upper body clothing?: A Little Help from another person to put on and taking off regular lower body clothing?: A Lot 6 Click Score: 16   End of Session Equipment Utilized During Treatment: Gait belt Nurse Communication: Mobility status  Activity Tolerance: Patient tolerated treatment well Patient left: in bed;with call bell/phone within reach;with bed alarm set  OT Visit Diagnosis: Unsteadiness on feet (R26.81);Other abnormalities of gait and mobility (R26.89);Muscle weakness (generalized) (M62.81)                Time: 1141-1200 OT Time Calculation (min): 19 min Charges:  OT General Charges $OT Visit: 1 Visit OT Evaluation $OT Eval Moderate Complexity: 1 Mod  Laurette Villescas K, OTD, OTR/L SecureChat Preferred Acute Rehab [phone]   Laneta POUR Koonce 01/04/2024, 12:32 PM

## 2024-01-04 NOTE — Progress Notes (Signed)
 HD#4 SUBJECTIVE:  Patient Summary: Christina Byrd is a 58 y.o. with a pertinent PMH of HTN, alcohol use disorder, seizure disorder nonadherent to home meds, brought to the MED via EMS after found unresponsive, hypoglycemic, and hypothermic and admitted for admitted to the ICU on 11/8. Since then, patient has been treated for metabolic encephalopathy secondary to EtOH use/withdrawal,seizures, presumed sepsis, hypothyroidism, and ongoing metabolic derangements consistent with refeeding syndrome. Patient has completed course of antibiotics, has not had recurrence of seizures while admitted, and continues on AED and phenobarbital  taper. Patient stable for transfer out of ICU on 11/11 and now under IMTS care.  Overnight Events: None  Interim History: Feeling better this AM. Endorses more of an appetite. Has been ambulating to brathroom to urinate. Less BM compared to yesterday; still loose. No chest pain, shortness of breath, or abdominal discomfort.   OBJECTIVE:  Vital Signs: Vitals:   01/03/24 1704 01/03/24 2030 01/04/24 0413 01/04/24 0806  BP: 107/75 (!) 137/97 116/74 106/71  Pulse: 77 79 83 88  Resp: 19   18  Temp:  (!) 97.4 F (36.3 C) 98.1 F (36.7 C) 98.6 F (37 C)  TempSrc:      SpO2: 100% 100% 100% 98%  Weight:      Height:       Supplemental O2: Room Air SpO2: 98 % O2 Flow Rate (L/min): 2 L/min  Filed Weights   01/01/24 0400 01/02/24 0337 01/03/24 0500  Weight: 46 kg 46 kg 50.6 kg     Intake/Output Summary (Last 24 hours) at 01/04/2024 1208 Last data filed at 01/04/2024 0900 Gross per 24 hour  Intake 836.93 ml  Output 0 ml  Net 836.93 ml   Net IO Since Admission: 9,160.28 mL [01/04/24 1208]  Physical Exam: General: Pleasant, chronically ill-appearing woman laying in bed. No acute distress. Head: Normocephalic. Atraumatic. CV: RRR. No murmurs, rubs, or gallops. No LE edema Pulmonary: Lungs CTAB. Normal effort. No wheezing or rales. Abdominal: Soft,  nontender, nondistended. Normal bowel sounds. Extremities: no lower extremity edema Skin: Warm and dry.  Neuro: Alert and oriented to self, place, and situation. Unable to recall month. No tremors present on exam Psych: Normal mood and affect   Patient Lines/Drains/Airways Status     Active Line/Drains/Airways     Name Placement date Placement time Site Days   PICC Double Lumen 01/01/24 Left Basilic 35 cm 0 cm 01/01/24  8864  -- 3   Wound 01/01/24 0000 Irritant Contact Dermatitis Buttocks 01/01/24  0000  Buttocks  3            Pertinent labs and imaging:      Latest Ref Rng & Units 01/04/2024    3:35 AM 01/03/2024    5:12 AM 01/02/2024   11:22 AM  CBC  WBC 4.0 - 10.5 K/uL 8.1  8.7  9.2   Hemoglobin 12.0 - 15.0 g/dL 8.5  8.7  7.9   Hematocrit 36.0 - 46.0 % 23.7  24.0  21.4   Platelets 150 - 400 K/uL 124  110  81        Latest Ref Rng & Units 01/04/2024    3:35 AM 01/03/2024    5:12 AM 01/02/2024    9:47 PM  CMP  Glucose 70 - 99 mg/dL 58  91  823   BUN 6 - 20 mg/dL 15  17  20    Creatinine 0.44 - 1.00 mg/dL 8.08  7.93  7.88   Sodium 135 - 145 mmol/L 139  140  142   Potassium 3.5 - 5.1 mmol/L 3.3  3.3  3.8   Chloride 98 - 111 mmol/L 105  104  104   CO2 22 - 32 mmol/L 23  24  20    Calcium  8.9 - 10.3 mg/dL 7.7  7.2  7.4   Total Protein 6.5 - 8.1 g/dL 4.3     Total Bilirubin 0.0 - 1.2 mg/dL 0.5     Alkaline Phos 38 - 126 U/L 72     AST 15 - 41 U/L 15     ALT 0 - 44 U/L 9       No results found.  ASSESSMENT/PLAN:  Assessment: Active Problems:   Seizure disorder (HCC)   Alcohol withdrawal (HCC)   Diarrhea   AKI (acute kidney injury)   Physical deconditioning   Alcohol use disorder   Hypomagnesemia   Hypokalemia   Plan: Toxic encephalopathy, improving Multifactorial likely from alcohol withdrawal below. Initially treated for sepsis though this was lower in differential. Patient clinically better with discontinuation of antibiotics. Currently feeling  better and more at her baseline.  - Will continue to monitor as she continues to improve with PO intake and medications below.   Current alcohol use disoder Alcohol withdrawal Empiric treatment for wernicke's encephalopathy CIWA 0 in the past 24 hrs. No ativan  use in the past 24 hrs.  - Transitioned to 32.5 mg IV phenobarbital  q8HRS - Ativan  1mg  q4GR for agitation - S/p 500 mg IV thiamine  x 3 days - on 250 mg IV thiamine  x 3 days  - will need 100 mg maintenance therafter - Folic acid   AKI Proteus mirabalis UTI Baseline sCr <1. Improving. Patient presented hypotensive and with lactic acidosis and with ongoing GI losses. Suspect this insult was initially pre-renal with ATN. UA with evidence of hemoglobinuria, pyuria and some bacteriuria. 50K colonies of P mirabalis on Ucx. S/p Zosyn  x 4 days.  -  Foley cath removed 11/11 -  Will measure post void residual today - Monitor I/O with ongoing GI losses  Diarrhea Chronic. Documented in history and per patient. 5x liquid stools documented in the last 24 hrs. Had been on docusate. Received loperamide  overnight. Infectious work up + c diff antigen but negative toxigenic c diff or hypervirulent strains. Negative GI panel. Chronic for patient. Suspect secondary to EtOH use. No abdominal pain today. Decreased stool output - Loperamide  2 mg q4HR PRN - Fibersource - Will check pancreatic elastase; patient has hx pancreatitis  Hypokalemia Hypomagnesemia Hypocalcemia Concern for refeeding syndrome - Supplemented today - Nutritional supplementation - Continue to monitor K, Mg, Phos, and Ca daily  Acute on chronic normocytic anemia S/p 2 pRBC this admission. Stable 8.5 - Folate supplementation - No recent evidence of Fe deficiency  Thrombocytopenia Improving. 110 > 124. No evidence of bleeding  Seizure disorder Seizures at home and in the ED. None during ICU or captured on EEG.  - Continue on Keppra  500 mg BID - Seizure precautions  ?  Atrial fibrillation on EKG on 11/10 Qtc prolongation  Last EKG 11/10 583 - EKG repeat today to assess Qtc and rhythm - Telemetry reviewed. No atrial fibrillation   GERD - PPI  Hypothyroidism - On home synthroid  dose 25 mcg daily  HTN HLD - Monitoring  Hepatic steatosis on CT Likely secondary to EtOH use - Hepatic panel within normal levels  Physical deconditioning - PT and OT ordered  Best Practice: Diet: Regular diet VTE: enoxaparin  (LOVENOX ) injection 30 mg Start: 01/03/24 1030  SCDs Start: 12/31/23 1957 Code: Full  Disposition planning: Therapy Recs: PT and OT pending DISPO: Anticipated discharge in 2 days pending PT recommendation  Signature:  Hadassah Elnora [facility]  12:08 PM, 01/04/2024  On Call pager [phone]

## 2024-01-05 ENCOUNTER — Other Ambulatory Visit (HOSPITAL_COMMUNITY): Payer: Self-pay

## 2024-01-05 DIAGNOSIS — N179 Acute kidney failure, unspecified: Secondary | ICD-10-CM | POA: Diagnosis not present

## 2024-01-05 DIAGNOSIS — R569 Unspecified convulsions: Secondary | ICD-10-CM | POA: Diagnosis not present

## 2024-01-05 DIAGNOSIS — F1010 Alcohol abuse, uncomplicated: Secondary | ICD-10-CM | POA: Diagnosis not present

## 2024-01-05 DIAGNOSIS — N390 Urinary tract infection, site not specified: Secondary | ICD-10-CM | POA: Diagnosis not present

## 2024-01-05 LAB — RENAL FUNCTION PANEL
Albumin: 1.6 g/dL — ABNORMAL LOW (ref 3.5–5.0)
Anion gap: 9 (ref 5–15)
BUN: 12 mg/dL (ref 6–20)
CO2: 22 mmol/L (ref 22–32)
Calcium: 7.6 mg/dL — ABNORMAL LOW (ref 8.9–10.3)
Chloride: 106 mmol/L (ref 98–111)
Creatinine, Ser: 1.87 mg/dL — ABNORMAL HIGH (ref 0.44–1.00)
GFR, Estimated: 31 mL/min — ABNORMAL LOW (ref >60)
Glucose, Bld: 110 mg/dL — ABNORMAL HIGH (ref 70–99)
Phosphorus: 2.6 mg/dL (ref 2.5–4.6)
Potassium: 3.3 mmol/L — ABNORMAL LOW (ref 3.5–5.1)
Sodium: 137 mmol/L (ref 135–145)

## 2024-01-05 LAB — CBC
HCT: 23.8 % — ABNORMAL LOW (ref 36.0–46.0)
Hemoglobin: 8.1 g/dL — ABNORMAL LOW (ref 12.0–15.0)
MCH: 31.4 pg (ref 26.0–34.0)
MCHC: 34 g/dL (ref 30.0–36.0)
MCV: 92.2 fL (ref 80.0–100.0)
Platelets: 153 K/uL (ref 150–400)
RBC: 2.58 MIL/uL — ABNORMAL LOW (ref 3.87–5.11)
RDW: 15.3 % (ref 11.5–15.5)
WBC: 9.5 K/uL (ref 4.0–10.5)
nRBC: 0 % (ref 0.0–0.2)

## 2024-01-05 LAB — GLUCOSE, CAPILLARY
Glucose-Capillary: 101 mg/dL — ABNORMAL HIGH (ref 70–99)
Glucose-Capillary: 112 mg/dL — ABNORMAL HIGH (ref 70–99)
Glucose-Capillary: 58 mg/dL — ABNORMAL LOW (ref 70–99)

## 2024-01-05 LAB — CULTURE, BLOOD (ROUTINE X 2)
Culture: NO GROWTH
Culture: NO GROWTH
Special Requests: ADEQUATE
Special Requests: ADEQUATE

## 2024-01-05 LAB — MAGNESIUM: Magnesium: 1.9 mg/dL (ref 1.7–2.4)

## 2024-01-05 MED ORDER — POTASSIUM CHLORIDE CRYS ER 20 MEQ PO TBCR
40.0000 meq | EXTENDED_RELEASE_TABLET | Freq: Once | ORAL | Status: DC
  Filled 2024-01-05: qty 2

## 2024-01-05 MED ORDER — POTASSIUM CHLORIDE CRYS ER 20 MEQ PO TBCR
20.0000 meq | EXTENDED_RELEASE_TABLET | Freq: Once | ORAL | 0 refills | Status: DC
  Filled 2024-01-05: qty 3, 3d supply, fill #0

## 2024-01-05 MED ORDER — LEVETIRACETAM 500 MG PO TABS
500.0000 mg | ORAL_TABLET | Freq: Two times a day (BID) | ORAL | 0 refills | Status: AC
  Filled 2024-01-05: qty 180, 90d supply, fill #0

## 2024-01-05 MED ORDER — DEXTROSE 50 % IV SOLN
INTRAVENOUS | Status: AC
  Administered 2024-01-05: 50 mL
  Filled 2024-01-05: qty 50

## 2024-01-05 MED ORDER — MAGNESIUM SULFATE 2 GM/50ML IV SOLN
2.0000 g | Freq: Once | INTRAVENOUS | Status: AC
  Administered 2024-01-05: 2 g via INTRAVENOUS
  Filled 2024-01-05: qty 50

## 2024-01-05 MED ORDER — POTASSIUM CHLORIDE CRYS ER 20 MEQ PO TBCR
20.0000 meq | EXTENDED_RELEASE_TABLET | Freq: Every day | ORAL | 0 refills | Status: AC
  Filled 2024-01-05: qty 3, 3d supply, fill #0

## 2024-01-05 NOTE — Progress Notes (Signed)
 Physical Therapy Treatment Patient Details Name: Christina Byrd MRN: [id number] DOB: 1965-12-09 Today's Date: 01/05/2024   History of Present Illness 58 y/o F presenting to ED on 11/8 with AMS in setting of chronic alcohol use, hypoglycemia, seizures, CT head negative, UA with UTI. EEG negative.    PMH includes GERD, HTN, alcohol use disorder, seizure disorder    PT Comments  Today's session focused on gait training and safety education. Pt with two LOB during session today, requiring mod-totalA to prevent a fall. Lengthy conversation had about benefits of using an AD with her poor balance as well as consequences and likelihood of her falling at home if she does not use one. Despite this, pt continues to adamantly refuse using any type of AD including rollator, RW, or cane, demonstrating that she has poor awareness of her safety deficits. She states her husband and chihuahuas can help her if she needs it. With pt permission, therapist called husband and discussed pt's balance deficits and refusal to use AD with therapy, and he verbalized understanding and that he has been trying to convince her to use an AD for quite some time but she refuses. Continuing to recommend [facility] PT upon d/c to improve balance deficits and safety with functional mobility.    If plan is discharge home, recommend the following: Assist for transportation;A little help with walking and/or transfers;A little help with bathing/dressing/bathroom;Supervision due to cognitive status;Assistance with cooking/housework   Can travel by private vehicle        Equipment Recommendations  None recommended by PT    Recommendations for Other Services       Precautions / Restrictions Precautions Precautions: Fall Recall of Precautions/Restrictions: Impaired Precaution/Restrictions Comments: Refuses use of RW and cane to assist with balance during gait. Restrictions Weight Bearing Restrictions Per Provider Order: No      Mobility  Bed Mobility Overal bed mobility: Needs Assistance Bed Mobility: Supine to Sit, Sit to Supine     Supine to sit: Min assist Sit to supine: Supervision   General bed mobility comments: minA for trunk management during supine>sit.    Transfers Overall transfer level: Needs assistance   Transfers: Sit to/from Stand Sit to Stand: Contact guard assist           General transfer comment: No physical assist required to power up, just CGA for safety. Following STS from [facility], pt with significant LOB requiring modA from therapist and grabbing bed to maintain balance.    Ambulation/Gait   Gait Distance (Feet): 150 Feet Assistive device: None Gait Pattern/deviations: Decreased step length - right, Decreased step length - left, Step-through pattern, Decreased dorsiflexion - right, Decreased dorsiflexion - left, Staggering left, Staggering right Gait velocity: Dec Gait velocity interpretation: 1.31 - 2.62 ft/sec, indicative of limited community ambulator   General Gait Details: Noted two overall LOB, one following transfer from [facility] as mentioned earlier, and another in hallway during gait. During 2nd LOB, pt lost balance to the R and anteriorly, requiring max-totalA to maintain upright. Pt verbalized help me while falling this time, demonstrating at least some understanding of being in low safety situation. Pt attempts to walk quickly, but this leads to further staggering and LOB.   Stairs             Wheelchair Mobility     Tilt Bed    Modified Rankin (Stroke Patients Only)       Balance Overall balance assessment: Needs assistance Sitting-balance support: Feet supported, No upper extremity supported Sitting  balance-Leahy Scale: Fair     Standing balance support: During functional activity Standing balance-Leahy Scale: Poor Standing balance comment: Reliant on therapist assist for balance                            Communication  Communication Communication: Impaired Factors Affecting Communication: Reduced clarity of speech  Cognition Arousal: Alert Behavior During Therapy: Impulsive, Agitated   PT - Cognitive impairments: Safety/Judgement, Problem solving, Awareness                       PT - Cognition Comments: Does not fully understand current safety and mobility deficits despite long discussion on importance of AD for her gait. Following commands: Impaired Following commands impaired: Follows one step commands with increased time, Follows multi-step commands inconsistently    Cueing Cueing Techniques: Verbal cues, Gestural cues  Exercises      General Comments General comments (skin integrity, edema, etc.): Had lengthy discussion with pt about her poor balance and needing to use an AD for ambulation, but pt adamantly refusing, even after staggering and requiring therapist assist to stop from falling twice in session. Attempted to make therapy salient to pt, but she demonstrates poor safety and judgement awareness and believes she will walk her dogs at home with no AD and won't fall. She often gave conflicting information, such as I haven't fallen at home and I have fallen twice recently in my bathroom and bedroom. Despite a lengthy conversation about her need for a RW and cane, she was adamant she would not use one and that her dogs would call 911 and help her if she needed it. Called husband with pt permission, who verbalized understanding of this information. He states he has been trying to convince her to use AD as well, but she refuses. He has to guard her at home often. Gave pt gait belt to take home and discussed using this with husband. Educated pt and husband on recs to have someone assist her with all standing mobility with gait belt provided if she refuses to use an AD. They verbalized understanding.      Pertinent Vitals/Pain Pain Assessment Pain Assessment: Faces Faces Pain Scale: No hurt     Home Living                          Prior Function            PT Goals (current goals can now be found in the care plan section) Acute Rehab PT Goals Patient Stated Goal: to go home PT Goal Formulation: With patient Time For Goal Achievement: 01/18/24 Potential to Achieve Goals: Poor Progress towards PT goals: Not progressing toward goals - comment (Adamantly refusing gait and safety education)    Frequency    Min 2X/week      PT Plan      Co-evaluation              AM-PAC PT 6 Clicks Mobility   Outcome Measure  Help needed turning from your back to your side while in a flat bed without using bedrails?: None Help needed moving from lying on your back to sitting on the side of a flat bed without using bedrails?: A Little Help needed moving to and from a bed to a chair (including a wheelchair)?: A Lot Help needed standing up from a chair using your arms (e.g., wheelchair or  bedside chair)?: A Little Help needed to walk in hospital room?: Total Help needed climbing 3-5 steps with a railing? : Total 6 Click Score: 14    End of Session Equipment Utilized During Treatment: Gait belt Activity Tolerance: Other (comment) (limited by pt behavior) Patient left: in bed;with call bell/phone within reach;with bed alarm set Nurse Communication: Mobility status PT Visit Diagnosis: Unsteadiness on feet (R26.81);Other abnormalities of gait and mobility (R26.89);Muscle weakness (generalized) (M62.81)     Time: 9074-9042 PT Time Calculation (min) (ACUTE ONLY): 32 min  Charges:    $Gait Training: 23-37 mins PT General Charges $$ ACUTE PT VISIT: 1 Visit                     Davarion Cuffee, SPT    Leianne Callins 01/05/2024, 12:05 PM

## 2024-01-05 NOTE — Discharge Instructions (Addendum)
 Thank you for allowing us  to be part of your care. 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 You also had a urinary tract infection. We treated you with nutrition, seizure medications, and antibiotics.   See the changes in your medications and management of your chronic conditions below:  *For your alcohol-induced seizures -We reduced your anti-seizure medicine, Keppra , to 500 mg twice daily. -It is very important for you to take this medicine twice a day to prevent seizures. -Please abstain from alcohol, I have attached information about alcohol misuse and its affect on your nutrition and quality of life.   *For your poor nutrition -We have sent you home with potassium tablets. Please take one tablet daily for the next three days.  -Please eat regular meals everyday.   We have not made any other changes to your medications.   FOLLOW UP APPOINTMENTS: We arranged for you to follow up with your family doctor at: 01/12/24 at 10:45AM  You should also be contacted to arrange home health.   Please call your PCP or our clinic if you have any questions or concerns, we may be able to help and keep you from a long and expensive emergency room wait. Our clinic and after hours phone number is [phone]. The best time to call is Monday through Friday 9 am to 4 pm but there is always someone available 24/7 if you have an emergency. If you need medication refills please notify your pharmacy one week in advance and they will send us  a request.   We are glad you are feeling better,  Viktoria King Internal Medicine Inpatient Teaching Service at [facility]

## 2024-01-05 NOTE — Progress Notes (Signed)
LA DL PICC removed per protocol per MD order. Manual pressure applied for 3 mins. Vaseline gauze, gauze, and Tegaderm applied over insertion site. No bleeding or swelling noted. Instructed patient to remain in bed for thirty mins. Educated patient about S/S of infection and when to call MD; no heavy lifting or pressure on left side for 24 hours; keep dressing dry and intact for 24 hours. Pt verbalized comprehension.

## 2024-01-05 NOTE — Discharge Summary (Signed)
 Name: Christina Byrd MRN: [id number] DOB: Nov 09, 1965 58 y.o. PCP: Harrie Bruckner, DO  Date of Admission: 12/31/2023  4:00 PM Date of Discharge: 01/05/2024 Attending Physician: Dr. Reyes Fenton  Discharge Diagnosis: 1. Active Problems:   Seizure disorder (HCC)   Alcohol abuse   Alcohol withdrawal (HCC)   Diarrhea   AKI (acute kidney injury)   Physical deconditioning   Alcohol use disorder   Hypomagnesemia   Hypokalemia   Alcohol withdrawal seizure with delirium [facility])    Discharge Medications: Allergies as of 01/05/2024       Reactions   Levaquin  [levofloxacin  In D5w] Rash   Losartan  Potassium Rash   Ace Inhibitors Cough   Cefepime  Rash   09/04/12 pm Patient started to break out with small macules after IV Vanco infusion, then macules increased in size after starting cefepime  infusion.   Chlorhexidine Itching, Rash   Codeine  Nausea Only   Vancomycin  Rash   09/04/12 pm Patient started to break out with small macules after IV Vanco infusion, then macules increased in size after starting cefepime  infusion. Tolerated in May 2025        Medication List     STOP taking these medications    diclofenac  Sodium 1 % Gel Commonly known as: VOLTAREN    ondansetron  4 MG disintegrating tablet Commonly known as: ZOFRAN -ODT   predniSONE  20 MG tablet Commonly known as: DELTASONE        TAKE these medications    acetaminophen  325 MG tablet Commonly known as: TYLENOL  Take 2 tablets (650 mg total) by mouth every 6 (six) hours as needed for mild pain (pain score 1-3) or fever (or Fever >/= 101).   allopurinol  100 MG tablet Commonly known as: ZYLOPRIM  Take 1 tablet (100 mg total) by mouth daily.   atorvastatin  20 MG tablet Commonly known as: LIPITOR Take 1 tablet (20 mg total) by mouth daily.   cetirizine  10 MG tablet Commonly known as: ZyrTEC  Allergy Take 1 tablet (10 mg total) by mouth daily.   DULoxetine  30 MG capsule Commonly known as: Cymbalta  Take  1 capsule (30 mg total) by mouth daily.   feeding supplement Liqd Take 237 mLs by mouth 3 (three) times daily between meals.   folic acid  1 MG tablet Commonly known as: FOLVITE  Take 1 tablet (1 mg total) by mouth daily.   gabapentin  100 MG capsule Commonly known as: NEURONTIN  Take 100 mg by mouth daily.   levETIRAcetam  500 MG tablet Commonly known as: KEPPRA  Take 1 tablet (500 mg total) by mouth 2 (two) times daily. What changed:  medication strength how much to take   levothyroxine  25 MCG tablet Commonly known as: SYNTHROID  Take 1 tablet (25 mcg total) by mouth daily before breakfast.   multivitamin with minerals Tabs tablet Take 1 tablet by mouth daily.   naltrexone  50 MG tablet Commonly known as: DEPADE Take 1 tablet (50 mg total) by mouth daily.   pantoprazole  40 MG tablet Commonly known as: Protonix  Take 1 tablet (40 mg total) by mouth daily.   potassium chloride  SA 20 MEQ tablet Commonly known as: KLOR-CON  M Take 1 tablet (20 mEq total) by mouth daily for 3 days. Start taking on: January 06, 2024   thiamine  100 MG tablet Commonly known as: VITAMIN B1 Take 1 tablet (100 mg total) by mouth daily.        Disposition and follow-up:   Ms.Esparanza L Azam was discharged from [facility] in Stable condition.  At the hospital follow up  visit please address:  1.  Alcohol Use Disorder: Patient in pre-contemplative stage. Reassess her willingness to do AA or engage in community resource for alcohol abstinence.   Malnutrition and refeeding syndrome: Ensure patient is eating adequate meals and that she's taking her thiamine , folic acid , and that she took the potassium tablets we gave her on discharge. Ensure electrolytes stable at hospital follow up.   AKI: Resolving in the hospital with fluids and regular nutrition. Cr 1.87 on discharge and down trending. Recheck at hospital follow up.   Seizures: Ensure patient hasn't had a seizure since leaving  the hospital and that she's adhering to Keppra  500 mg BID.   Acute on chronic normocytic anemia: Transfused in hospital. Hgb 8.1 and stable on discharge. Recheck at hospital follow up.   2.  Labs / imaging needed at time of follow-up: CBC to check Hgb, CMP to check electrolytes and kidney function, and a Mg.   3.  Pending labs/ test needing follow-up: pancreatic elastase   Follow-up Appointments:  Follow-up Information     [facility].   Specialty: Home Health Services Why: Someone will call you to schedule first home visit. Contact information: [address] [place] [address] [phone]         Renne Homans, MD. Go on 01/12/2024.   Specialty: Internal Medicine Why: 10:45 AM Contact information: [address] [place] [address] [phone]                  Hospital Course by problem list: [name] is a 58 y.o. with a pertinent PMH of HTN, alcohol use disorder, seizure disorder nonadherent to home meds, brought to the MED via EMS after found unresponsive, hypoglycemic, and hypothermic and admitted for admitted to the ICU on 11/8. She was treated for metabolic encephalopathy secondary to EtOH use/withdrawal, seizures, presumed sepsis, hypothyroidism, and ongoing metabolic derangements consistent with refeeding syndrome. She was transitioned on 11/11 to care by IMTS.   Toxic encephalopathy, resolved Lactic Acidosis, resolved  Multifactorial likely from alcohol withdrawal below. Day of discharge, she feels better and is eating well and able to walk around yesterday. She wants to go home. Appears to be back at her baseline.    Current alcohol use disoder Alcohol withdrawal, resolved  Wernicke's encephalopathy Was monitored on CIWA. On phenobarbital  taper, down to 32.5 mg on 11/12 and discontinued on day of discharge. No ativan  use in the past 48 hrs. Vitals normal. Exam negative for nystagmus, diaphoresis,  nausea, vomiting or agitation on day of discharge. Does have a mild bilateral tremor. Discussed abstaining from alcohol on day of discharge, she is in the pre-contemplative stage.    AKI, improving  Proteus mirabalis UTI Baseline sCr <1. Creatinine 2.62 on admission, Cr 1.87 on day of discharge. Patient presented hypotensive and with lactic acidosis and with ongoing GI losses. Suspect this insult was initially pre-renal with ATN. UA with evidence of hemoglobinuria, pyuria and some bacteriuria. 50K colonies of P mirabalis on culture. Completed Zosyn  x 4 days. Afebrile and WBC wnl on day of discharge. Continue home folic acid  and thiamine . PCP follow up next week.    Diarrhea Chronic. Documented in history and per patient. Infectious work up + c diff antigen but negative toxigenic c diff or hypervirulent strains. Negative GI panel. Suspect secondary to EtOH use. One documented stool in last 24 hours, has not required loperamide  since 11/11. With history of pancreatitis, checked pancreatic elastase, still  pending results.    Hypokalemia Hypomagnesemia Hypocalcemia, resolved  Concern for refeeding syndrome Nutritional supplementation provided during hospitalization. K 3.3 and Mg 1.9 and supplemented with 40 mEq K and 2 g Mg on day of discharge. Corrected Ca > 9. Discharged home with 3 days of 20 mEq K. Close follow up with PCP for repeat labs.    Acute on chronic normocytic anemia S/p 2 pRBC this admission. Hgb 8.1 on day of discharge and stable. No evidence of Iron deficiency.    Thrombocytopenia, resolved 153 on day of discharge.    Seizure disorder Seizures at home and in the ED. Was on Keppra  500 mg BID in hospital. No seizures throughout hospitalization. Home Keppra  dose 750 mg BID, will discharge home on 500 mg BID.   Episode of Atrial fibrillation Qtc prolongation, resolved EKG on 11/10 c/w atrial fibrillation and prolonged Qtc (583 ms). Likely in the setting of severe electrolyte  abnormalities. Repeat EKG on 11/12 with NSR and normal Qtc (469 ms).    GERD Continue home pantoprazole  40 mg daily.    Hypothyroidism Continue home synthroid  dose 25 mcg daily.   HTN HLD 122/84 on day of discharge and stable. Continue home atorvastatin  20 mg daily.    Hepatic steatosis on CT Likely secondary to EtOH use. Hepatic panel within normal levels.   Subjective She slept well last night, does not have any abdominal pain or chest pain. She feels back to normal and wants to go home. She walked around fine yesterday and is eating well. Discussed why she is here and she doesn't seem to believe alcohol plays a part, she says she's in the hospital because she wasn't eating. Encouraged her to abstain from alcohol and consider AA.   Discharge Exam:   BP 122/84   Pulse 73   Temp 97.7 F (36.5 C)   Resp 18   Ht 5' 6 (1.676 m)   Wt 50.6 kg   LMP 08/23/2011   SpO2 100%   BMI 18.01 kg/m  Physical Exam Vitals reviewed.  Constitutional:      Appearance: Normal appearance. She is not diaphoretic.  HENT:     Nose: Nose normal.     Mouth/Throat:     Mouth: Mucous membranes are moist.     Pharynx: Oropharynx is clear.  Eyes:     General: No scleral icterus.    Extraocular Movements: Extraocular movements intact.     Conjunctiva/sclera: Conjunctivae normal.  Cardiovascular:     Rate and Rhythm: Normal rate and regular rhythm.     Pulses: Normal pulses.     Heart sounds: Normal heart sounds.  Pulmonary:     Effort: Pulmonary effort is normal.  Abdominal:     General: Bowel sounds are normal.  Musculoskeletal:        General: No tenderness.     Right lower leg: No edema.     Left lower leg: No edema.  Skin:    General: Skin is warm.  Neurological:     General: No focal deficit present.     Mental Status: She is alert and oriented to person, place, and time.     Comments: Mild BL hand tremor   Psychiatric:        Mood and Affect: Mood normal.        Behavior:  Behavior normal.   Pertinent Labs, Studies, and Procedures:     Latest Ref Rng & Units 01/05/2024    2:40 AM 01/04/2024    3:35  AM 01/03/2024    5:12 AM  CBC  WBC 4.0 - 10.5 K/uL 9.5  8.1  8.7   Hemoglobin 12.0 - 15.0 g/dL 8.1  8.5  8.7   Hematocrit 36.0 - 46.0 % 23.8  23.7  24.0   Platelets 150 - 400 K/uL 153  124  110        Latest Ref Rng & Units 01/05/2024    2:40 AM 01/04/2024    3:35 AM 01/03/2024    5:12 AM  CMP  Glucose 70 - 99 mg/dL 889  58  91   BUN 6 - 20 mg/dL 12  15  17    Creatinine 0.44 - 1.00 mg/dL 8.12  8.08  7.93   Sodium 135 - 145 mmol/L 137  139  140   Potassium 3.5 - 5.1 mmol/L 3.3  3.3  3.3   Chloride 98 - 111 mmol/L 106  105  104   CO2 22 - 32 mmol/L 22  23  24    Calcium  8.9 - 10.3 mg/dL 7.6  7.7  7.2   Total Protein 6.5 - 8.1 g/dL  4.3    Total Bilirubin 0.0 - 1.2 mg/dL  0.5    Alkaline Phos 38 - 126 U/L  72    AST 15 - 41 U/L  15    ALT 0 - 44 U/L  9      ECHOCARDIOGRAM COMPLETE Result Date: 01/01/2024    ECHOCARDIOGRAM REPORT   Patient Name:   JUSTYCE YEATER Date of Exam: 01/01/2024 Medical Rec #:  [id number]          Height:       66.0 in Accession #:    [id number]         Weight:       101.4 lb Date of Birth:  06/23/1965          BSA:          1.498 m Patient Age:    58 years           BP:           109/86 mmHg Patient Gender: F                  HR:           89 bpm. Exam Location:  Inpatient Procedure: 2D Echo, Cardiac Doppler and Color Doppler (Both Spectral and Color            Flow Doppler were utilized during procedure). Indications:    Abnormal ECG R94.31  History:        Patient has prior history of Echocardiogram examinations, most                 recent 12/24/2022. CKD, stage 3; Risk Factors:Dyslipidemia.  Sonographer:    Thea Norlander RCS Referring Phys: ABDULLAHI HUSSEIN IMPRESSIONS  1. Left ventricular ejection fraction, by estimation, is 55 to 60%. The left ventricle has normal function. The left ventricle has no regional wall motion  abnormalities. Left ventricular diastolic parameters were normal.  2. Right ventricular systolic function is normal. The right ventricular size is normal. Tricuspid regurgitation signal is inadequate for assessing PA pressure.  3. The mitral valve is abnormal. Mild mitral valve regurgitation. No evidence of mitral stenosis.  4. The aortic valve is tricuspid. Aortic valve regurgitation is not visualized. No aortic stenosis is present.  5. The inferior vena cava is dilated in size with <50% respiratory variability, suggesting right atrial pressure  of 15 mmHg. FINDINGS  Left Ventricle: Left ventricular ejection fraction, by estimation, is 55 to 60%. The left ventricle has normal function. The left ventricle has no regional wall motion abnormalities. The left ventricular internal cavity size was normal in size. There is  no left ventricular hypertrophy. Left ventricular diastolic parameters were normal. Right Ventricle: The right ventricular size is normal. Right vetricular wall thickness was not well visualized. Right ventricular systolic function is normal. Tricuspid regurgitation signal is inadequate for assessing PA pressure. Left Atrium: Left atrial size was normal in size. Right Atrium: Right atrial size was normal in size. Pericardium: There is no evidence of pericardial effusion. Mitral Valve: The mitral valve is abnormal. Mild mitral valve regurgitation. No evidence of mitral valve stenosis. Tricuspid Valve: The tricuspid valve is normal in structure. Tricuspid valve regurgitation is trivial. No evidence of tricuspid stenosis. Aortic Valve: The aortic valve is tricuspid. Aortic valve regurgitation is not visualized. No aortic stenosis is present. Aortic valve mean gradient measures 2.2 mmHg. Aortic valve peak gradient measures 4.2 mmHg. Aortic valve area, by VTI measures 2.24 cm. Pulmonic Valve: The pulmonic valve was not well visualized. Pulmonic valve regurgitation is not visualized. No evidence of pulmonic  stenosis. Aorta: The aortic root and ascending aorta are structurally normal, with no evidence of dilitation. Venous: The inferior vena cava is dilated in size with less than 50% respiratory variability, suggesting right atrial pressure of 15 mmHg. IAS/Shunts: No atrial level shunt detected by color flow Doppler.  LEFT VENTRICLE PLAX 2D LVIDd:         3.95 cm   Diastology LVIDs:         2.85 cm   LV e' medial:    9.36 cm/s LV PW:         0.80 cm   LV E/e' medial:  9.2 LV IVS:        0.75 cm   LV e' lateral:   11.90 cm/s LVOT diam:     1.90 cm   LV E/e' lateral: 7.2 LV SV:         44 LV SV Index:   29 LVOT Area:     2.84 cm  RIGHT VENTRICLE             IVC RV S prime:     14.50 cm/s  IVC diam: 2.50 cm TAPSE (M-mode): 2.1 cm LEFT ATRIUM             Index        RIGHT ATRIUM          Index LA diam:        2.95 cm 1.97 cm/m   RA Area:     9.97 cm LA Vol (A2C):   42.6 ml 28.43 ml/m  RA Volume:   21.20 ml 14.15 ml/m LA Vol (A4C):   23.6 ml 15.75 ml/m LA Biplane Vol: 34.0 ml 22.69 ml/m  AORTIC VALVE AV Area (Vmax):    2.21 cm AV Area (Vmean):   2.23 cm AV Area (VTI):     2.24 cm AV Vmax:           102.14 cm/s AV Vmean:          68.264 cm/s AV VTI:            0.196 m AV Peak Grad:      4.2 mmHg AV Mean Grad:      2.2 mmHg LVOT Vmax:         79.70 cm/s  LVOT Vmean:        53.800 cm/s LVOT VTI:          0.155 m LVOT/AV VTI ratio: 0.79  AORTA Ao Root diam: 3.30 cm Ao Asc diam:  2.90 cm MITRAL VALVE MV Area (PHT): 5.97 cm    SHUNTS MV Decel Time: 127 msec    Systemic VTI:  0.16 m MV E velocity: 86.20 cm/s  Systemic Diam: 1.90 cm MV A velocity: 70.00 cm/s MV E/A ratio:  1.23 Dorn Ross MD Electronically signed by Dorn Ross MD Signature Date/Time: 01/01/2024/5:39:20 PM    Final    US  EKG SITE RITE Result Date: 01/01/2024 If Site Rite image not attached, placement could not be confirmed due to current cardiac rhythm.  CT Head Wo Contrast Result Date: 12/31/2023 EXAM: CT HEAD WITHOUT CONTRAST 12/31/2023  07:24:01 PM TECHNIQUE: CT of the head was performed without the administration of intravenous contrast. Automated exposure control, iterative reconstruction, and/or weight based adjustment of the mA/kV was utilized to reduce the radiation dose to as low as reasonably achievable. COMPARISON: 12/27/2023 CLINICAL HISTORY: Mental status change, unknown cause. FINDINGS: BRAIN AND VENTRICLES: No acute hemorrhage. 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 No evidence of acute infarct. No hydrocephalus. No extra-axial collection. No mass effect or midline shift. ORBITS: No acute abnormality. SINUSES: Mucosal thickening in the ethmoid air cells, sphenoid, maxillary, and coronal sinuses. Opacification of a few left mastoid air cells. The paranasal sinus mucosal thickening has increased compared to the 12/27/2023. SOFT TISSUES AND SKULL: No acute soft tissue abnormality. No skull fracture. IMPRESSION: 1. No acute intracranial abnormality. 2. Increased paranasal sinus mucosal thickening compared to 12/27/23. Correlation for acute sinusitis recommended. Electronically signed by: Norman Gatlin MD 12/31/2023 07:37 PM EST RP Workstation: [id number]   DG Chest Portable 1 View Result Date: 12/31/2023 EXAM: 1 VIEW(S) XRAY OF THE CHEST 12/31/2023 04:19:00 PM COMPARISON: 08/25/2023 CLINICAL HISTORY: cough FINDINGS: LUNGS AND PLEURA: No focal pulmonary opacity. No pulmonary edema. No pleural effusion. No pneumothorax. HEART AND MEDIASTINUM: No acute abnormality of the cardiac and mediastinal silhouettes. BONES AND SOFT TISSUES: No acute osseous abnormality. IMPRESSION: 1. No acute process. Electronically signed by: Norman Gatlin MD 12/31/2023 04:30 PM EST RP Workstation: [id number]     Discharge Instructions: Discharge Instructions     Call MD for:  persistant nausea and vomiting   Complete by: As directed    Call MD for:  temperature >100.4   Complete by: As  directed    Diet general   Complete by: As directed    Discharge instructions   Complete by: As directed    Thank you for allowing us  to be part of your care. 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 You also had a urinary tract infection. We treated you with nutrition, seizure medications, and antibiotics.    See the changes in your medications and management of your chronic conditions below:   *For your alcohol-induced seizures -We reduced your anti-seizure medicine, Keppra , to 500 mg twice daily. -It is very important for you to take this medicine twice a day to prevent seizures. -Please abstain from alcohol, I have attached information about alcohol misuse and its affect on your nutrition and quality of life.    *For your poor nutrition -We have sent you home with potassium tablets. Please take one tablet daily for the next three days.  -Please eat regular meals everyday.    We have  not made any other changes to your medications.    FOLLOW UP APPOINTMENTS: We arranged for you to follow up with your family doctor at: 01/12/24 at 10:45AM   You should also be contacted to arrange home health.    Please call your PCP or our clinic if you have any questions or concerns, we may be able to help and keep you from a long and expensive emergency room wait. Our clinic and after hours phone number is [phone]. The best time to call is Monday through Friday 9 am to 4 pm but there is always someone available 24/7 if you have an emergency. If you need medication refills please notify your pharmacy one week in advance and they will send us  a request.    We are glad you are feeling better,   Viktoria King Internal Medicine Inpatient Teaching Service at [facility]   No wound care   Complete by: As directed        Signed: King Viktoria, DO 01/05/2024, 11:01 AM

## 2024-01-05 NOTE — Hospital Course (Addendum)
 Christina Byrd is a 58 y.o. with a pertinent PMH of HTN, alcohol use disorder, seizure disorder nonadherent to home meds, brought to the MED via EMS after found unresponsive, hypoglycemic, and hypothermic and admitted for admitted to the ICU on 11/8. She was treated for metabolic encephalopathy secondary to EtOH use/withdrawal, seizures, presumed sepsis, hypothyroidism, and ongoing metabolic derangements consistent with refeeding syndrome. She was transitioned on 11/11 to care by IMTS.   Toxic encephalopathy, resolved Lactic Acidosis, resolved  Multifactorial likely from alcohol withdrawal below. Day of discharge, she feels better and is eating well and able to walk around yesterday. She wants to go home. Appears to be back at her baseline.    Current alcohol use disoder Alcohol withdrawal, resolved  Wernicke's encephalopathy Was monitored on CIWA. On phenobarbital  taper, down to 32.5 mg on 11/12 and discontinued on day of discharge. No ativan  use in the past 48 hrs. Vitals normal. Exam negative for nystagmus, diaphoresis, nausea, vomiting or agitation on day of discharge. Does have a mild bilateral tremor. Discussed abstaining from alcohol on day of discharge, she is in the pre-contemplative stage.    AKI, improving  Proteus mirabalis UTI Baseline sCr <1. Creatinine 2.62 on admission, Cr 1.87 on day of discharge. Patient presented hypotensive and with lactic acidosis and with ongoing GI losses. Suspect this insult was initially pre-renal with ATN. UA with evidence of hemoglobinuria, pyuria and some bacteriuria. 50K colonies of P mirabalis on culture. Completed Zosyn  x 4 days. Afebrile and WBC wnl on day of discharge. Continue home folic acid  and thiamine . PCP follow up next week.    Diarrhea Chronic. Documented in history and per patient. Infectious work up + c diff antigen but negative toxigenic c diff or hypervirulent strains. Negative GI panel. Suspect secondary to EtOH use. One documented  stool in last 24 hours, has not required loperamide  since 11/11. With history of pancreatitis, checked pancreatic elastase, still pending results.    Hypokalemia Hypomagnesemia Hypocalcemia, resolved  Concern for refeeding syndrome Nutritional supplementation provided during hospitalization. K 3.3 and Mg 1.9 and supplemented with 40 mEq K and 2 g Mg on day of discharge. Corrected Ca > 9. Discharged home with 3 days of 20 mEq K. Close follow up with PCP for repeat labs.    Acute on chronic normocytic anemia S/p 2 pRBC this admission. Hgb 8.1 on day of discharge and stable. No evidence of Iron deficiency.    Thrombocytopenia, resolved 153 on day of discharge.    Seizure disorder Seizures at home and in the ED. Was on Keppra  500 mg BID in hospital. No seizures throughout hospitalization. Home Keppra  dose 750 mg BID, will discharge home on 500 mg BID.   Episode of Atrial fibrillation Qtc prolongation, resolved EKG on 11/10 c/w atrial fibrillation and prolonged Qtc (583 ms). Likely in the setting of severe electrolyte abnormalities. Repeat EKG on 11/12 with NSR and normal Qtc (469 ms).    GERD Continue home pantoprazole  40 mg daily.    Hypothyroidism Continue home synthroid  dose 25 mcg daily.   HTN HLD 122/84 on day of discharge and stable. Continue home atorvastatin  20 mg daily.    Hepatic steatosis on CT Likely secondary to EtOH use. Hepatic panel within normal levels.

## 2024-01-05 NOTE — Plan of Care (Signed)

## 2024-01-05 NOTE — TOC Transition Note (Signed)
 Transition of Care [facility]) - Discharge Note   Patient Details  Name: Christina Byrd MRN: [id number] Date of Birth: 1965/10/24  Transition of Care [facility]) CM/SW Contact:  Tom-Johnson, Harvest Muskrat, RN Phone Number: 01/05/2024, 10:01 AM   Clinical Narrative:     Patient is scheduled for discharge today.  Readmission Risk Assessment done. Home health info, outpatient f/u, hospital f/u and discharge instructions on AVS. Prescriptions sent to [facility] pharmacy and patient will receive meds prior discharge. Significant other, [name] to transport at discharge.  No further ICM needs noted.       Final next level of care: Home w Home Health Services Barriers to Discharge: Barriers Resolved   Patient Goals and CMS Choice Patient states their goals for this hospitalization and ongoing recovery are:: To return home CMS Medicare.gov Compare Post Acute Care list provided to:: Patient Choice offered to / list presented to : Patient      Discharge Placement                Patient to be transferred to facility by: Significant Other Name of family member notified: [facility]    Discharge Plan and Services Additional resources added to the After Visit Summary for                  DME Arranged: N/A DME Agency: NA       HH Arranged: PT, OT, RN (Medication Management) HH Agency: [facility] Date [facility] Agency Contacted: 01/04/24 Time HH Agency Contacted: [phone] Representative spoke with at [facility] Agency: [place]  Social Drivers of Health (SDOH) Interventions SDOH Screenings   Food Insecurity: No Food Insecurity (01/01/2024)  Housing: Low Risk  (01/01/2024)  Transportation Needs: No Transportation Needs (01/01/2024)  Utilities: At Risk (01/01/2024)  Alcohol Screen: Low Risk  (10/27/2023)  Depression (PHQ2-9): High Risk (12/08/2023)  Financial Resource Strain: High Risk (10/27/2023)  Physical Activity: Inactive (10/27/2023)  Social Connections: Moderately Integrated (10/27/2023)   Stress: No Stress Concern Present (10/27/2023)  Tobacco Use: Medium Risk (12/31/2023)  Health Literacy: Adequate Health Literacy (10/27/2023)     Readmission Risk Interventions    01/04/2024    3:21 PM 10/18/2023   10:34 AM 06/22/2023    2:16 PM  Readmission Risk Prevention Plan  Transportation Screening Complete Complete Complete  Medication Review (RN Care Manager) Referral to Pharmacy Complete Complete  PCP or Specialist appointment within 3-5 days of discharge Complete Complete Complete  HRI or Home Care Consult Complete Complete Complete  SW Recovery Care/Counseling Consult Complete Complete Complete  Palliative Care Screening Not Applicable Not Applicable Not Applicable  Skilled Nursing Facility Not Applicable Complete Patient Refused

## 2024-01-09 ENCOUNTER — Telehealth: Payer: Self-pay | Admitting: *Deleted

## 2024-01-09 ENCOUNTER — Ambulatory Visit: Payer: Self-pay

## 2024-01-09 NOTE — Telephone Encounter (Signed)
 FYI Only or Action Required?: FYI only for provider: appointment scheduled on 01/12/24.  Patient was last seen in primary care on 12/08/2023 by Harrie Bruckner, DO.  Called Nurse Triage reporting Shaking and Hospitalization Follow-up.  Symptoms began a week ago.  Interventions attempted: Rest, hydration, or home remedies and Other: endorses medication adherence.  Symptoms are: unchanged.  Triage Disposition: See PCP When Office is Open (Within 3 Days)  Patient/caregiver understands and will follow disposition?: Yes          Copied from CRM #[id number]. Topic: Clinical - Red Word Triage >> Jan 09, 2024 12:29 PM Zane F wrote: [facility] that prompted transfer to Nurse Triage:   Concern: Hands shaking did not stop after hospital stay two days ago  Symptoms: Hand shaking is not constant  When did the symptoms start?:   What have you done to aid in the concern ? Have you taken anything to assist with the matter?: Yes  If so, what did you take?: metFORMIN  (GLUCOPHAGE ) 500 MG tablet  Wanted to let you know I will be transferring you to further discuss your concern. Please be advised the nurse can assist with scheduling. Reason for Disposition  [1] Muscle jerks or tics without an obvious cause AND [2] brief (seconds, gone now) AND [3] otherwise feels normal (well)    Pt endorses feeling at baseline, confirmed with significant other heard in background.   Per chart, recent hospitalization for ETOH withdrawal. Pt endorsed having upcoming appt Thursday 01/12/24.  Condition / symptoms SAME (not improving)  Answer Assessment - Initial Assessment Questions 1. APPEARANCE of MOVEMENT: What did the jerking or twitching look like? (e.g., body area)     Hands shaking hands shake when holding cup 2. ONSET: When did this start happening? (e.g., hours, days, weeks, months ago)     Since hospital D/C 3. DURATION: How long does the jerk, twitch, or spasm last?     In the AM -  self resolves after taking AM meds 4. FREQUENCY:  How often does this happen?      When holding objects 5. WHEN: When does this happen? (e.g., while awake, while falling asleep, while sleeping)     Upon waking 6. CAUSE: What do you think caused the sx?     unknown 7. OTHER SYMPTOMS: Are there any other symptoms? (e.g., fever, headache)     denies 8. PREGNANCY: Is there any chance you are pregnant? When was your last menstrual period?     N/a  Answer Assessment - Initial Assessment Questions 1. ONSET: When did the seizure occur?     2 days, since hospital d/c 2. DURATION: How long did the seizure last (or how long has it been happening)? (e.g., seconds, minutes)  Note: Most seizures last less than 5 minutes.     *No Answer* 3. DESCRIPTION: Describe what happened during the seizure. Did the body become stiff? Was there any jerking?  Did they lose consciousness during the seizure?     Hands shake after I get up if I'm holding a cup of coffee... it shakes 4. CIRCUMSTANCE: What was the person doing when the seizure began?      *No Answer* 5. MENTAL STATUS AFTER SEIZURE: Does the person seem more groggy or sleepy? Does the person know who they are, who you are, and where they are now?      *No Answer* 6. PRIOR SEIZURES: Has the person had a seizure (convulsion) before? (e.g., epilepsy, other cause)  If Yes,  ask: When was the last time? and What happened last time?      *No Answer* 7. EPILEPSY: Does the person have epilepsy? Note: Check for medical ID bracelet.     *No Answer* 8. MEDICINES: Does the person take anticonvulsant medications? (e.g., Yes, No; missed doses, any recent changes)     *No Answer* 9. INJURY: Was the person hurt or injured during the seizure? (e.g., hit their head, bit their tongue)     *No Answer* 10. OTHER SYMPTOMS: Are there any other symptoms? (e.g., fever, headache)       *No Answer* 11. PREGNANCY: Is there any  chance you are pregnant? When was your last menstrual period?       *No Answer*  Protocols used: Seizure-A-AH, Muscle Jerks - Tics - Shudders-A-AH, Post-Hospitalization Follow-up Call-A-AH

## 2024-01-09 NOTE — Telephone Encounter (Signed)
 Copied from CRM #[id number]. Topic: Clinical - Home Health Verbal Orders >> Jan 09, 2024  2:10 PM Carrielelia G wrote: Caller/Agency:  Mitzie with [facility] Callback Number: [phone] Service Requested: Physical Therapy Any new concerns about the patient? Yes  Patient has refused therapy at time, stated her husband is helping her

## 2024-01-09 NOTE — Telephone Encounter (Signed)
 RTC to patient.  States not having the shakes.  It goes away when she takes her Metformin  , eats and takes her seizure medication.  Reminded patient that she has an Has an appointment on Thursday.

## 2024-01-12 ENCOUNTER — Ambulatory Visit (INDEPENDENT_AMBULATORY_CARE_PROVIDER_SITE_OTHER): Admitting: Student

## 2024-01-12 VITALS — BP 150/110 | HR 79 | Ht 66.0 in | Wt 111.6 lb

## 2024-01-12 DIAGNOSIS — D649 Anemia, unspecified: Secondary | ICD-10-CM | POA: Diagnosis not present

## 2024-01-12 DIAGNOSIS — G40909 Epilepsy, unspecified, not intractable, without status epilepticus: Secondary | ICD-10-CM

## 2024-01-12 DIAGNOSIS — F1090 Alcohol use, unspecified, uncomplicated: Secondary | ICD-10-CM | POA: Diagnosis not present

## 2024-01-12 DIAGNOSIS — Z91199 Patient's noncompliance with other medical treatment and regimen due to unspecified reason: Secondary | ICD-10-CM

## 2024-01-12 DIAGNOSIS — Z87891 Personal history of nicotine dependence: Secondary | ICD-10-CM

## 2024-01-12 DIAGNOSIS — Z79899 Other long term (current) drug therapy: Secondary | ICD-10-CM

## 2024-01-12 NOTE — Progress Notes (Signed)
 CC: Hospital follow-up   HPI:  [name] is a 58 y.o. female living with a history stated below and presents today for hospital follow-up. Please see problem based assessment and plan for additional details.  Past Medical History:  Diagnosis Date   GASTROESOPHAGEAL REFLUX, NO ESOPHAGITIS 04/21/2006   Qualifier: Diagnosis of  By: WATT, JOANNE     GERD (gastroesophageal reflux disease) Dx 1995   Hot flash, menopausal 06/22/2016   Hot flashes 01/30/2020   HTN (hypertension)    Hypotension 10/21/2022   Patient's blood pressure at presentation was 87/65, follow up blood pressure was 81/58. Patient reports regurgitating everything she has taken po for the past month. Denies lightheadedness or dizziness. Discussed GI referral, though patient stated she would be going to the emergency room today for IV fluids.   -GI referral for upper endoscopy placed  -Patient planning on going to the ED for IV flu   Intertrigo 06/22/2016   Nausea & vomiting 12/23/2022   Nausea and vomiting 12/24/2022   Pain and swelling of left ankle 12/02/2014   Seizures (HCC)    Tendinitis of right hip flexor 07/23/2014   Tendonitis, Achilles, right 04/01/2014    Current Outpatient Medications on File Prior to Visit  Medication Sig Dispense Refill   acetaminophen  (TYLENOL ) 325 MG tablet Take 2 tablets (650 mg total) by mouth every 6 (six) hours as needed for mild pain (pain score 1-3) or fever (or Fever >/= 101). 30 tablet 0   allopurinol  (ZYLOPRIM ) 100 MG tablet Take 1 tablet (100 mg total) by mouth daily. 90 tablet 0   atorvastatin  (LIPITOR) 20 MG tablet Take 1 tablet (20 mg total) by mouth daily. 90 tablet 3   cetirizine  (ZYRTEC  ALLERGY) 10 MG tablet Take 1 tablet (10 mg total) by mouth daily. 30 tablet 2   DULoxetine  (CYMBALTA ) 30 MG capsule Take 1 capsule (30 mg total) by mouth daily. 30 capsule 2   feeding supplement (ENSURE ENLIVE / ENSURE PLUS) LIQD Take 237 mLs by mouth 3 (three) times daily  between meals. (Patient not taking: Reported on 08/26/2023) 237 mL 12   folic acid  (FOLVITE ) 1 MG tablet Take 1 tablet (1 mg total) by mouth daily. 30 tablet 2   gabapentin  (NEURONTIN ) 100 MG capsule Take 100 mg by mouth daily.     levETIRAcetam  (KEPPRA ) 500 MG tablet Take 1 tablet (500 mg total) by mouth 2 (two) times daily. 180 tablet 0   levothyroxine  (SYNTHROID ) 25 MCG tablet Take 1 tablet (25 mcg total) by mouth daily before breakfast. 60 tablet 3   Multiple Vitamin (MULTIVITAMIN WITH MINERALS) TABS tablet Take 1 tablet by mouth daily. (Patient not taking: Reported on 08/26/2023) 30 tablet 0   naltrexone  (DEPADE) 50 MG tablet Take 1 tablet (50 mg total) by mouth daily. (Patient not taking: Reported on 12/31/2023) 30 tablet 1   pantoprazole  (PROTONIX ) 40 MG tablet Take 1 tablet (40 mg total) by mouth daily. 90 tablet 3   potassium chloride  SA (KLOR-CON  M) 20 MEQ tablet Take 1 tablet (20 mEq total) by mouth daily for 3 days. 3 tablet 0   thiamine  (VITAMIN B1) 100 MG tablet Take 1 tablet (100 mg total) by mouth daily. 30 tablet 0   [DISCONTINUED] metFORMIN  (GLUCOPHAGE ) 500 MG tablet Take 0.5 tablets (250 mg total) by mouth daily with breakfast. 45 tablet 6   No current facility-administered medications on file prior to visit.    Family History  Problem Relation Age of Onset  CAD Mother     Social History   Socioeconomic History   Marital status: Single    Spouse name: Not on file   Number of children: Not on file   Years of education: Not on file   Highest education level: Not on file  Occupational History   Not on file  Tobacco Use   Smoking status: Former    Current packs/day: 0.00    Types: Cigarettes    Quit date: 06/25/2012    Years since quitting: 11.5   Smokeless tobacco: Never  Vaping Use   Vaping status: Never Used  Substance and Sexual Activity   Alcohol use: Not Currently    Alcohol/week: 49.0 standard drinks of alcohol    Types: 14 Cans of beer, 35 Shots of liquor  per week   Drug use: No   Sexual activity: Not Currently  Other Topics Concern   Not on file  Social History Narrative   Pt lives in single story home with her partner   Has 1 son   5 grandchildren   10th grade education   Works at [employer].    Right handed   Social Drivers of Health   Financial Resource Strain: High Risk (10/27/2023)   Overall Financial Resource Strain (CARDIA)    Difficulty of Paying Living Expenses: Very hard  Food Insecurity: No Food Insecurity (01/01/2024)   Hunger Vital Sign    Worried About Running Out of Food in the Last Year: Never true    Ran Out of Food in the Last Year: Never true  Transportation Needs: No Transportation Needs (01/01/2024)   PRAPARE - Administrator, Civil Service (Medical): No    Lack of Transportation (Non-Medical): No  Physical Activity: Inactive (10/27/2023)   Exercise Vital Sign    Days of Exercise per Week: 0 days    Minutes of Exercise per Session: 0 min  Stress: No Stress Concern Present (10/27/2023)   [employer] of Occupational Health - Occupational Stress Questionnaire    Feeling of Stress: Not at all  Social Connections: Moderately Integrated (10/27/2023)   Social Connection and Isolation Panel    Frequency of Communication with Friends and Family: More than three times a week    Frequency of Social Gatherings with Friends and Family: Once a week    Attends Religious Services: More than 4 times per year    Active Member of [employer] or Organizations: No    Attends Banker Meetings: Never    Marital Status: Living with partner  Intimate Partner Violence: Not At Risk (01/01/2024)   Humiliation, Afraid, Rape, and Kick questionnaire    Fear of Current or Ex-Partner: No    Emotionally Abused: No    Physically Abused: No    Sexually Abused: No    Review of Systems: ROS negative except for what is noted on the assessment and plan.  Vitals:   01/12/24 1112  BP: (!) 152/95  Pulse: 72  SpO2:  98%  Weight: 111 lb 9.6 oz (50.6 kg)  Height: 5' 6 (1.676 m)    Physical Exam: Well-appearing woman sitting in a wheelchair; cardiovascular exam shows regular rate and rhythm without murmurs, rubs, or gallops; pulmonary exam demonstrates normal work of breathing on room air; neurologic exam notable for rushed speech; skin warm and dry; and mood and behavior are normal.  Assessment & Plan:   Seizure disorder [facility]) Patient presents for follow-up after hospitalization for toxic encephalopathy, likely related to alcohol use.  During the hospital stay, she experienced electrolyte derangements in the setting of acute kidney injury. Assessment / Plan: Seizure risk: Concern for ongoing risk of seizures. Medication: Continue Keppra  500 mg twice daily. Laboratory monitoring: Repeat labs including magnesium  were recommended to monitor electrolytes and prevent complications; patient declined despite counseling on the associated risks. Patient counseling: Education provided regarding risks of seizures and importance of laboratory monitoring; patient understands but declined testing.  Alcohol use disorder The patient was recently hospitalized, likely due to ongoing alcohol use. She has been prescribed naltrexone  but reports not taking it. She also declined referral to AA meetings today. The patient stated that her last alcohol use was prior to hospitalization; however, alcohol odor was noted on her breath during the office visit. It is unclear whether there is impaired insight or a possible cognitive limitation, which complicates offering interventions at this time. She was accompanied by her husband, who appears engaged and reasonably supportive. He was educated on the signs of alcohol withdrawal and instructed on when to seek emergency care for the patient if needed.  Hypomagnesemia Declined rechecking magnesium  today  Chronic anemia The patient was recently found to have acute-on-chronic anemia during  her last hospitalization and received a blood transfusion. At discharge, her hemoglobin was stable at 8.1 g/dL. It would be reasonable to recheck a CBC to assess for normalization, but the patient declined labs today.She is encouraged to continue taking her Protonix  as prescribed.  Patient declined all vaccines recommendation today   Patient discussed with Dr. Francesco Drue Grow, M.D [facility] Phone: [phone] Date 01/12/2024 Time 11:53 AM

## 2024-01-12 NOTE — Assessment & Plan Note (Signed)
 Declined rechecking magnesium  today

## 2024-01-12 NOTE — Assessment & Plan Note (Signed)
 The patient was recently found to have acute-on-chronic anemia during her last hospitalization and received a blood transfusion. At discharge, her hemoglobin was stable at 8.1 g/dL. It would be reasonable to recheck a CBC to assess for normalization, but the patient declined labs today.She is encouraged to continue taking her Protonix  as prescribed.

## 2024-01-12 NOTE — Assessment & Plan Note (Signed)
 Patient presents for follow-up after hospitalization for toxic encephalopathy, likely related to alcohol use. During the hospital stay, she experienced electrolyte derangements in the setting of acute kidney injury. Assessment / Plan: Seizure risk: Concern for ongoing risk of seizures. Medication: Continue Keppra  500 mg twice daily. Laboratory monitoring: Repeat labs including magnesium  were recommended to monitor electrolytes and prevent complications; patient declined despite counseling on the associated risks. Patient counseling: Education provided regarding risks of seizures and importance of laboratory monitoring; patient understands but declined testing.

## 2024-01-12 NOTE — Patient Instructions (Signed)
 Thank you, [name] for allowing us  to provide your care today. Today we discussed your recent hospital visit.  It is VERY IMPORTANT that you stop drinking alcohol and take your naltrexone  as discussed  It is very important that we have your labs checked today but you have refused all labs and all recommended vaccines in your care gaps as well.  Please let us  know how we can help you.  I have ordered the following labs for you:  Lab Orders  No laboratory test(s) ordered today     Tests ordered today:    Referrals ordered today:   Referral Orders  No referral(s) requested today     I have ordered the following medication/changed the following medications:   Stop the following medications: There are no discontinued medications.   Start the following medications: No orders of the defined types were placed in this encounter.    Follow up: 4 months     Should you have any questions or concerns please call the internal medicine clinic at [phone].   Drue Lisa Grow MD 01/12/2024, 11:42 AM   [facility]

## 2024-01-12 NOTE — Assessment & Plan Note (Signed)
 The patient was recently hospitalized, likely due to ongoing alcohol use. She has been prescribed naltrexone  but reports not taking it. She also declined referral to AA meetings today. The patient stated that her last alcohol use was prior to hospitalization; however, alcohol odor was noted on her breath during the office visit. It is unclear whether there is impaired insight or a possible cognitive limitation, which complicates offering interventions at this time. She was accompanied by her husband, who appears engaged and reasonably supportive. He was educated on the signs of alcohol withdrawal and instructed on when to seek emergency care for the patient if needed.

## 2024-01-14 NOTE — Progress Notes (Signed)
 Internal Medicine Clinic Attending  Case discussed with the resident at the time of the visit.  We reviewed the resident's history and exam and pertinent patient test results.  I agree with the assessment, diagnosis, and plan of care documented in the resident's note.

## 2024-01-17 ENCOUNTER — Other Ambulatory Visit (HOSPITAL_COMMUNITY): Payer: Self-pay

## 2024-01-24 ENCOUNTER — Ambulatory Visit: Payer: Self-pay | Admitting: Student

## 2024-01-25 ENCOUNTER — Ambulatory Visit: Payer: Self-pay | Admitting: Student

## 2024-01-25 ENCOUNTER — Ambulatory Visit: Payer: Self-pay

## 2024-01-25 NOTE — Telephone Encounter (Signed)
 FYI Only or Action Required?: FYI only for provider: patient will call to reschedule.  Patient was last seen in primary care on 01/12/2024 by Renne Homans, MD.  Called Nurse Triage reporting Fall.  Symptoms began several days ago.  Interventions attempted: Nothing.  Symptoms are: gradually improving.  Triage Disposition: Home Care  Patient/caregiver understands and will follow disposition?: Yes Reason for Disposition  Small bruise is present  Answer Assessment - Initial Assessment Questions Patient denies hitting head, if she was more seriously injured she would have went to the hospital. Patient stated she will call to reschedule.   1. MECHANISM: How did the fall happen?     Legs are no good, they weak  2. DOMESTIC VIOLENCE AND ELDER ABUSE SCREENING: Did you fall because someone pushed you or tried to hurt you? If Yes, ask: Are you safe now?     N/A  3. ONSET: When did the fall happen? (e.g., minutes, hours, or days ago)     Monday and yesterday  4. LOCATION: What part of the body hit the ground? (e.g., back, buttocks, head, hips, knees, hands, head, stomach)     Knees and elbow  5. INJURY: Did you hurt (injure) yourself when you fell? If Yes, ask: What did you injure? Tell me more about this? (e.g., body area; type of injury; pain severity)     Skinned up knees and elbow, buttocks  6. PAIN: Is there any pain? If Yes, ask: How bad is the pain? (e.g., Scale 0-10; or none, mild,      8/10  7. OTHER SYMPTOMS: Do you have any other symptoms? (e.g., dizziness, fever, weakness; new-onset or worsening).      Denies  8. CAUSE: What do you think caused the fall (or falling)? (e.g., dizzy spell, tripped)       Weak legs, knees pop, was told she has gout  Protocols used: Falls and [facility]  Copied from CRM [id number]. Topic: Clinical - Red Word Triage >> Jan 25, 2024 12:05 PM Chiquita SQUIBB wrote: Red Word that prompted transfer to Nurse Triage: Patient is  calling in to reschedule her appointment for today , patient stated she recently had a bad fall and is struggling to walk.

## 2024-01-26 NOTE — Telephone Encounter (Signed)
 RTC to patient. No answer. Unable to leave message that the Clinics had called.

## 2024-02-13 ENCOUNTER — Ambulatory Visit: Payer: Self-pay | Admitting: Student

## 2024-02-17 ENCOUNTER — Other Ambulatory Visit: Payer: Self-pay | Admitting: Student

## 2024-02-17 DIAGNOSIS — M1049 Other secondary gout, multiple sites: Secondary | ICD-10-CM

## 2024-02-17 DIAGNOSIS — E876 Hypokalemia: Secondary | ICD-10-CM

## 2024-02-17 NOTE — Telephone Encounter (Unsigned)
 Copied from CRM #[id number]. Topic: Clinical - Medication Refill >> Feb 17, 2024  3:23 PM Debby BROCKS wrote: Medication: levothyroxine  (SYNTHROID ) 25 MCG tablet allopurinol  (ZYLOPRIM ) 100 MG tablet prednisone  20 mg   Has the patient contacted their pharmacy? Yes (Agent: If no, request that the patient contact the pharmacy for the refill. If patient does not wish to contact the pharmacy document the reason why and proceed with request.) (Agent: If yes, when and what did the pharmacy advise?)  This is the patient's preferred pharmacy:  [id number] - [place], [place] - [address] AT [facility] OF [address]   [id number] Phone: [phone] Fax: [phone]  Is this the correct pharmacy for this prescription? Yes If no, delete pharmacy and type the correct one.   Has the prescription been filled recently? No  Is the patient out of the medication? Yes  Has the patient been seen for an appointment in the last year OR does the patient have an upcoming appointment? Yes  Can we respond through MyChart? Yes  Agent: Please be advised that Rx refills may take up to 3 business days. We ask that you follow-up with your pharmacy.

## 2024-02-21 ENCOUNTER — Telehealth: Payer: Self-pay | Admitting: *Deleted

## 2024-02-21 NOTE — Telephone Encounter (Unsigned)
 Copied from CRM #[id number]. Topic: Clinical - Prescription Issue >> Feb 21, 2024  3:53 PM [name] wrote: Reason for CRM:  Levothyroxine  Sodium 25 mcg Oral Daily before breakfast Allopurinol  100 mg Oral Daily Pt stated she called and the pharmacy stated they didn't have any prescriptions ready, I read her the message from Nurse Kenneth where the pt has 3 refills on file, she stated she called them and they advised her she didn't have any refills or medication ready to call the provider. Pt stated she was going to call the pharmacy again .

## 2024-02-24 ENCOUNTER — Other Ambulatory Visit: Payer: Self-pay | Admitting: Student

## 2024-02-24 DIAGNOSIS — E876 Hypokalemia: Secondary | ICD-10-CM

## 2024-02-24 DIAGNOSIS — M1049 Other secondary gout, multiple sites: Secondary | ICD-10-CM

## 2024-02-24 MED ORDER — ALLOPURINOL 100 MG PO TABS: 100.0000 mg | ORAL_TABLET | Freq: Every day | ORAL | 0 refills | Status: AC

## 2024-02-24 MED ORDER — LEVOTHYROXINE SODIUM 25 MCG PO TABS: 25.0000 ug | ORAL_TABLET | Freq: Every day | ORAL | 3 refills | Status: AC

## 2024-03-15 NOTE — Telephone Encounter (Signed)
 I called the pharmacy and spoke with [facility]. Per [facility] they received a rx for levothyroxine  which was returned back to their stock due to the patient not picking up the medication. [facility] stated the allopurinol  is in process of being refilled. Both medications will be ready after 12:30 pm tomorrow. Patient is aware.

## 2024-03-15 NOTE — Telephone Encounter (Signed)
 Copied from CRM #[id number]. Topic: Clinical - Medication Question >> Mar 15, 2024  2:25 PM Farrel B wrote: Reason for CRM: [id number]: pt states that she still has not received any of her medications, she states that her gout is continuing to flare, when she called the pharmacy she was told that they couldn't fill the meds, the pt assumed it was because her insurance was denying the meds. I advised the pt that several apt for f/u and meds mang. Was a no show or canceled, pt stated she had never cancelled apt. She is needing to speak to someone regarding the list of medications she has not been able to acquire  allopurinol  (ZYLOPRIM ) 100 MG tablet levothyroxine  (SYNTHROID ) 25 MCG tablet Please call patient and advise. >> Mar 15, 2024  4:02 PM Susanna ORN wrote: Patient called stating that she's been in the hospital for 2 1/2 months and she doesn't understand how they are wanting her to come in to the office. States she hasn't received her medication & says the nurse was suppose to call her back but never did. Called [facility] & spoke with Coral Soler and she was able to assist.

## 2024-03-22 ENCOUNTER — Ambulatory Visit: Payer: Self-pay

## 2024-03-23 ENCOUNTER — Ambulatory Visit: Payer: Self-pay
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
## 2024-03-27 ENCOUNTER — Telehealth: Payer: Self-pay | Admitting: *Deleted

## 2024-03-27 NOTE — Telephone Encounter (Signed)
 RTC to patient unable to leave a VM to RTC to Clinics and to schedule an appointment.

## 2024-03-27 NOTE — Telephone Encounter (Signed)
 Pt's SO stated pt has jury duty and needs a letter stating she's unable to go. Stated he's not sure when the date is. I asked him to call back to let us  know and explained why she cannot go. Also informed him an appt may be needed.
